# Patient Record
Sex: Female | Born: 1955
Health system: Southern US, Community
[De-identification: ages and names within clinical notes are randomized; demographics above are authoritative.]

## PROBLEM LIST (undated history)

## (undated) ENCOUNTER — Ambulatory Visit: Admission: EM | Payer: Medicare HMO | Source: Home / Self Care

## (undated) DIAGNOSIS — H25813 Combined forms of age-related cataract, bilateral: Secondary | ICD-10-CM

## (undated) DIAGNOSIS — E119 Type 2 diabetes mellitus without complications: Secondary | ICD-10-CM

## (undated) DIAGNOSIS — I10 Essential (primary) hypertension: Secondary | ICD-10-CM

## (undated) DIAGNOSIS — A419 Sepsis, unspecified organism: Secondary | ICD-10-CM

## (undated) DIAGNOSIS — M1990 Unspecified osteoarthritis, unspecified site: Secondary | ICD-10-CM

## (undated) DIAGNOSIS — J189 Pneumonia, unspecified organism: Secondary | ICD-10-CM

## (undated) DIAGNOSIS — F419 Anxiety disorder, unspecified: Secondary | ICD-10-CM

## (undated) DIAGNOSIS — A0472 Enterocolitis due to Clostridium difficile, not specified as recurrent: Secondary | ICD-10-CM

## (undated) DIAGNOSIS — M5126 Other intervertebral disc displacement, lumbar region: Secondary | ICD-10-CM

## (undated) DIAGNOSIS — I639 Cerebral infarction, unspecified: Secondary | ICD-10-CM

## (undated) DIAGNOSIS — Z5189 Encounter for other specified aftercare: Secondary | ICD-10-CM

## (undated) DIAGNOSIS — R0602 Shortness of breath: Secondary | ICD-10-CM

## (undated) DIAGNOSIS — H409 Unspecified glaucoma: Secondary | ICD-10-CM

## (undated) DIAGNOSIS — K219 Gastro-esophageal reflux disease without esophagitis: Secondary | ICD-10-CM

## (undated) DIAGNOSIS — J449 Chronic obstructive pulmonary disease, unspecified: Secondary | ICD-10-CM

## (undated) DIAGNOSIS — J45909 Unspecified asthma, uncomplicated: Secondary | ICD-10-CM

## (undated) DIAGNOSIS — C3490 Malignant neoplasm of unspecified part of unspecified bronchus or lung: Secondary | ICD-10-CM

## (undated) DIAGNOSIS — E785 Hyperlipidemia, unspecified: Secondary | ICD-10-CM

## (undated) DIAGNOSIS — D649 Anemia, unspecified: Secondary | ICD-10-CM

## (undated) DIAGNOSIS — J439 Emphysema, unspecified: Secondary | ICD-10-CM

## (undated) DIAGNOSIS — Z8619 Personal history of other infectious and parasitic diseases: Secondary | ICD-10-CM

## (undated) DIAGNOSIS — T7840XA Allergy, unspecified, initial encounter: Secondary | ICD-10-CM

## (undated) HISTORY — DX: Type 2 diabetes mellitus without complications: E11.9

## (undated) HISTORY — PX: TUBAL LIGATION: SHX77

## (undated) HISTORY — DX: Hyperlipidemia, unspecified: E78.5

## (undated) HISTORY — DX: Pneumonia, unspecified organism: J18.9

## (undated) HISTORY — DX: Chronic obstructive pulmonary disease, unspecified: J44.9

## (undated) HISTORY — DX: Other intervertebral disc displacement, lumbar region: M51.26

## (undated) HISTORY — DX: Emphysema, unspecified: J43.9

## (undated) HISTORY — DX: Enterocolitis due to Clostridium difficile, not specified as recurrent: A04.72

## (undated) HISTORY — DX: Combined forms of age-related cataract, bilateral: H25.813

## (undated) HISTORY — DX: Shortness of breath: R06.02

## (undated) HISTORY — PX: UPPER GI ENDOSCOPY: SHX6162

## (undated) HISTORY — DX: Unspecified asthma, uncomplicated: J45.909

## (undated) HISTORY — DX: Anemia, unspecified: D64.9

## (undated) HISTORY — DX: Personal history of other infectious and parasitic diseases: Z86.19

## (undated) HISTORY — DX: Essential (primary) hypertension: I10

---
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
## 2003-07-27 ENCOUNTER — Other Ambulatory Visit: Payer: Self-pay

## 2003-11-21 ENCOUNTER — Ambulatory Visit: Payer: Self-pay | Admitting: Family Medicine

## 2005-04-18 ENCOUNTER — Ambulatory Visit: Payer: Self-pay

## 2007-07-14 ENCOUNTER — Ambulatory Visit: Payer: Self-pay | Admitting: Orthopedic Surgery

## 2007-08-03 ENCOUNTER — Ambulatory Visit: Payer: Self-pay | Admitting: Pain Medicine

## 2007-08-18 ENCOUNTER — Ambulatory Visit: Payer: Self-pay | Admitting: Pain Medicine

## 2007-09-01 ENCOUNTER — Ambulatory Visit: Payer: Self-pay | Admitting: Physician Assistant

## 2007-11-13 ENCOUNTER — Emergency Department: Payer: Self-pay | Admitting: Emergency Medicine

## 2007-11-14 ENCOUNTER — Emergency Department: Payer: Self-pay | Admitting: Internal Medicine

## 2008-02-07 ENCOUNTER — Emergency Department: Payer: Self-pay | Admitting: Emergency Medicine

## 2008-04-05 ENCOUNTER — Emergency Department: Payer: Self-pay | Admitting: Emergency Medicine

## 2008-05-25 ENCOUNTER — Emergency Department: Payer: Self-pay | Admitting: Emergency Medicine

## 2008-08-07 ENCOUNTER — Emergency Department: Payer: Self-pay | Admitting: Emergency Medicine

## 2009-12-04 ENCOUNTER — Observation Stay: Payer: Self-pay | Admitting: *Deleted

## 2010-07-23 ENCOUNTER — Ambulatory Visit: Payer: Self-pay | Admitting: Family Medicine

## 2011-05-10 DIAGNOSIS — L0291 Cutaneous abscess, unspecified: Secondary | ICD-10-CM | POA: Diagnosis not present

## 2011-05-10 DIAGNOSIS — L0390 Cellulitis, unspecified: Secondary | ICD-10-CM | POA: Diagnosis not present

## 2011-05-10 DIAGNOSIS — I10 Essential (primary) hypertension: Secondary | ICD-10-CM | POA: Diagnosis not present

## 2011-05-21 DIAGNOSIS — E119 Type 2 diabetes mellitus without complications: Secondary | ICD-10-CM | POA: Diagnosis not present

## 2011-05-23 DIAGNOSIS — J449 Chronic obstructive pulmonary disease, unspecified: Secondary | ICD-10-CM | POA: Diagnosis not present

## 2011-05-23 DIAGNOSIS — Z0000 Encounter for general adult medical examination without abnormal findings: Secondary | ICD-10-CM | POA: Diagnosis not present

## 2011-05-23 DIAGNOSIS — E119 Type 2 diabetes mellitus without complications: Secondary | ICD-10-CM | POA: Diagnosis not present

## 2011-05-23 DIAGNOSIS — E781 Pure hyperglyceridemia: Secondary | ICD-10-CM | POA: Diagnosis not present

## 2011-05-23 DIAGNOSIS — I10 Essential (primary) hypertension: Secondary | ICD-10-CM | POA: Diagnosis not present

## 2011-05-23 DIAGNOSIS — F17200 Nicotine dependence, unspecified, uncomplicated: Secondary | ICD-10-CM | POA: Diagnosis not present

## 2011-06-07 DIAGNOSIS — G4700 Insomnia, unspecified: Secondary | ICD-10-CM | POA: Diagnosis not present

## 2011-06-07 DIAGNOSIS — M5126 Other intervertebral disc displacement, lumbar region: Secondary | ICD-10-CM | POA: Diagnosis not present

## 2011-06-07 DIAGNOSIS — M549 Dorsalgia, unspecified: Secondary | ICD-10-CM | POA: Diagnosis not present

## 2011-06-07 DIAGNOSIS — F17200 Nicotine dependence, unspecified, uncomplicated: Secondary | ICD-10-CM | POA: Diagnosis not present

## 2011-06-26 ENCOUNTER — Ambulatory Visit: Payer: Self-pay | Admitting: Family Medicine

## 2011-06-26 DIAGNOSIS — Z1231 Encounter for screening mammogram for malignant neoplasm of breast: Secondary | ICD-10-CM | POA: Diagnosis not present

## 2011-08-10 ENCOUNTER — Emergency Department: Payer: Self-pay | Admitting: Emergency Medicine

## 2011-08-13 DIAGNOSIS — I10 Essential (primary) hypertension: Secondary | ICD-10-CM | POA: Diagnosis not present

## 2011-08-13 DIAGNOSIS — E119 Type 2 diabetes mellitus without complications: Secondary | ICD-10-CM | POA: Diagnosis not present

## 2011-08-13 DIAGNOSIS — M5126 Other intervertebral disc displacement, lumbar region: Secondary | ICD-10-CM | POA: Diagnosis not present

## 2011-08-13 DIAGNOSIS — J209 Acute bronchitis, unspecified: Secondary | ICD-10-CM | POA: Diagnosis not present

## 2011-08-19 ENCOUNTER — Ambulatory Visit: Payer: Self-pay | Admitting: Family Medicine

## 2011-08-19 DIAGNOSIS — M5126 Other intervertebral disc displacement, lumbar region: Secondary | ICD-10-CM | POA: Diagnosis not present

## 2011-08-19 DIAGNOSIS — M47817 Spondylosis without myelopathy or radiculopathy, lumbosacral region: Secondary | ICD-10-CM | POA: Diagnosis not present

## 2011-09-06 DIAGNOSIS — IMO0002 Reserved for concepts with insufficient information to code with codable children: Secondary | ICD-10-CM | POA: Diagnosis not present

## 2011-12-02 DIAGNOSIS — E119 Type 2 diabetes mellitus without complications: Secondary | ICD-10-CM | POA: Diagnosis not present

## 2011-12-02 DIAGNOSIS — J209 Acute bronchitis, unspecified: Secondary | ICD-10-CM | POA: Diagnosis not present

## 2011-12-02 DIAGNOSIS — E781 Pure hyperglyceridemia: Secondary | ICD-10-CM | POA: Diagnosis not present

## 2011-12-02 DIAGNOSIS — I10 Essential (primary) hypertension: Secondary | ICD-10-CM | POA: Diagnosis not present

## 2011-12-12 DIAGNOSIS — E119 Type 2 diabetes mellitus without complications: Secondary | ICD-10-CM | POA: Diagnosis not present

## 2012-01-13 DIAGNOSIS — Z23 Encounter for immunization: Secondary | ICD-10-CM | POA: Diagnosis not present

## 2012-01-13 DIAGNOSIS — E781 Pure hyperglyceridemia: Secondary | ICD-10-CM | POA: Diagnosis not present

## 2012-01-13 DIAGNOSIS — I10 Essential (primary) hypertension: Secondary | ICD-10-CM | POA: Diagnosis not present

## 2012-01-13 DIAGNOSIS — M549 Dorsalgia, unspecified: Secondary | ICD-10-CM | POA: Diagnosis not present

## 2012-01-13 DIAGNOSIS — M25559 Pain in unspecified hip: Secondary | ICD-10-CM | POA: Diagnosis not present

## 2012-02-02 ENCOUNTER — Emergency Department: Payer: Self-pay | Admitting: Emergency Medicine

## 2012-02-02 DIAGNOSIS — Z79899 Other long term (current) drug therapy: Secondary | ICD-10-CM | POA: Diagnosis not present

## 2012-02-02 DIAGNOSIS — R51 Headache: Secondary | ICD-10-CM | POA: Diagnosis not present

## 2012-02-02 DIAGNOSIS — I10 Essential (primary) hypertension: Secondary | ICD-10-CM | POA: Diagnosis not present

## 2012-02-02 DIAGNOSIS — J449 Chronic obstructive pulmonary disease, unspecified: Secondary | ICD-10-CM | POA: Diagnosis not present

## 2012-02-02 DIAGNOSIS — R059 Cough, unspecified: Secondary | ICD-10-CM | POA: Diagnosis not present

## 2012-02-02 DIAGNOSIS — E119 Type 2 diabetes mellitus without complications: Secondary | ICD-10-CM | POA: Diagnosis not present

## 2012-02-02 DIAGNOSIS — J4489 Other specified chronic obstructive pulmonary disease: Secondary | ICD-10-CM | POA: Diagnosis not present

## 2012-02-02 DIAGNOSIS — F17200 Nicotine dependence, unspecified, uncomplicated: Secondary | ICD-10-CM | POA: Diagnosis not present

## 2012-02-02 DIAGNOSIS — R05 Cough: Secondary | ICD-10-CM | POA: Diagnosis not present

## 2012-02-02 DIAGNOSIS — R0602 Shortness of breath: Secondary | ICD-10-CM | POA: Diagnosis not present

## 2012-02-02 LAB — CBC
HGB: 13.9 g/dL (ref 12.0–16.0)
MCH: 31.7 pg (ref 26.0–34.0)
MCHC: 32.9 g/dL (ref 32.0–36.0)
Platelet: 301 10*3/uL (ref 150–440)
RBC: 4.37 10*6/uL (ref 3.80–5.20)
RDW: 14.2 % (ref 11.5–14.5)

## 2012-02-02 LAB — BASIC METABOLIC PANEL
BUN: 14 mg/dL (ref 7–18)
Calcium, Total: 8.6 mg/dL (ref 8.5–10.1)
Co2: 24 mmol/L (ref 21–32)
Creatinine: 0.68 mg/dL (ref 0.60–1.30)
EGFR (African American): >60
Osmolality: 281 (ref 275–301)
Potassium: 3.9 mmol/L (ref 3.5–5.1)
Sodium: 137 mmol/L (ref 136–145)

## 2012-02-02 LAB — CK TOTAL AND CKMB (NOT AT ARMC)
CK, Total: 91 U/L (ref 21–215)
CK-MB: <0.5 ng/mL — ABNORMAL LOW (ref 0.5–3.6)

## 2012-02-04 DIAGNOSIS — H6690 Otitis media, unspecified, unspecified ear: Secondary | ICD-10-CM | POA: Diagnosis not present

## 2012-03-16 DIAGNOSIS — E119 Type 2 diabetes mellitus without complications: Secondary | ICD-10-CM | POA: Diagnosis not present

## 2012-03-16 DIAGNOSIS — I10 Essential (primary) hypertension: Secondary | ICD-10-CM | POA: Diagnosis not present

## 2012-03-16 DIAGNOSIS — E782 Mixed hyperlipidemia: Secondary | ICD-10-CM | POA: Diagnosis not present

## 2012-03-16 DIAGNOSIS — H9319 Tinnitus, unspecified ear: Secondary | ICD-10-CM | POA: Diagnosis not present

## 2012-04-02 DIAGNOSIS — H6980 Other specified disorders of Eustachian tube, unspecified ear: Secondary | ICD-10-CM | POA: Diagnosis not present

## 2012-04-02 DIAGNOSIS — H903 Sensorineural hearing loss, bilateral: Secondary | ICD-10-CM | POA: Diagnosis not present

## 2012-04-02 DIAGNOSIS — H905 Unspecified sensorineural hearing loss: Secondary | ICD-10-CM | POA: Diagnosis not present

## 2012-05-29 DIAGNOSIS — J449 Chronic obstructive pulmonary disease, unspecified: Secondary | ICD-10-CM | POA: Diagnosis not present

## 2012-05-29 DIAGNOSIS — I10 Essential (primary) hypertension: Secondary | ICD-10-CM | POA: Diagnosis not present

## 2012-05-29 DIAGNOSIS — E119 Type 2 diabetes mellitus without complications: Secondary | ICD-10-CM | POA: Diagnosis not present

## 2012-05-29 DIAGNOSIS — G4700 Insomnia, unspecified: Secondary | ICD-10-CM | POA: Diagnosis not present

## 2012-06-29 DIAGNOSIS — M2240 Chondromalacia patellae, unspecified knee: Secondary | ICD-10-CM | POA: Diagnosis not present

## 2012-09-01 DIAGNOSIS — E1129 Type 2 diabetes mellitus with other diabetic kidney complication: Secondary | ICD-10-CM | POA: Diagnosis not present

## 2012-09-01 DIAGNOSIS — E781 Pure hyperglyceridemia: Secondary | ICD-10-CM | POA: Diagnosis not present

## 2012-09-01 DIAGNOSIS — I10 Essential (primary) hypertension: Secondary | ICD-10-CM | POA: Diagnosis not present

## 2012-09-03 DIAGNOSIS — I10 Essential (primary) hypertension: Secondary | ICD-10-CM | POA: Diagnosis not present

## 2012-09-03 DIAGNOSIS — E781 Pure hyperglyceridemia: Secondary | ICD-10-CM | POA: Diagnosis not present

## 2012-09-03 DIAGNOSIS — E119 Type 2 diabetes mellitus without complications: Secondary | ICD-10-CM | POA: Diagnosis not present

## 2012-09-03 DIAGNOSIS — M25569 Pain in unspecified knee: Secondary | ICD-10-CM | POA: Diagnosis not present

## 2012-09-17 DIAGNOSIS — M549 Dorsalgia, unspecified: Secondary | ICD-10-CM | POA: Diagnosis not present

## 2012-09-17 DIAGNOSIS — M159 Polyosteoarthritis, unspecified: Secondary | ICD-10-CM | POA: Diagnosis not present

## 2012-09-17 DIAGNOSIS — M545 Low back pain, unspecified: Secondary | ICD-10-CM | POA: Diagnosis not present

## 2012-11-17 DIAGNOSIS — J449 Chronic obstructive pulmonary disease, unspecified: Secondary | ICD-10-CM | POA: Diagnosis not present

## 2012-11-17 DIAGNOSIS — Z23 Encounter for immunization: Secondary | ICD-10-CM | POA: Diagnosis not present

## 2012-11-17 DIAGNOSIS — M549 Dorsalgia, unspecified: Secondary | ICD-10-CM | POA: Diagnosis not present

## 2012-11-17 DIAGNOSIS — M25569 Pain in unspecified knee: Secondary | ICD-10-CM | POA: Diagnosis not present

## 2012-11-17 DIAGNOSIS — M545 Low back pain, unspecified: Secondary | ICD-10-CM | POA: Diagnosis not present

## 2012-11-17 DIAGNOSIS — M6740 Ganglion, unspecified site: Secondary | ICD-10-CM | POA: Diagnosis not present

## 2012-12-09 DIAGNOSIS — I10 Essential (primary) hypertension: Secondary | ICD-10-CM | POA: Diagnosis not present

## 2012-12-09 DIAGNOSIS — E119 Type 2 diabetes mellitus without complications: Secondary | ICD-10-CM | POA: Diagnosis not present

## 2012-12-09 DIAGNOSIS — Z23 Encounter for immunization: Secondary | ICD-10-CM | POA: Diagnosis not present

## 2012-12-09 DIAGNOSIS — J449 Chronic obstructive pulmonary disease, unspecified: Secondary | ICD-10-CM | POA: Diagnosis not present

## 2012-12-09 DIAGNOSIS — E782 Mixed hyperlipidemia: Secondary | ICD-10-CM | POA: Diagnosis not present

## 2013-01-01 ENCOUNTER — Ambulatory Visit: Payer: Self-pay | Admitting: Family Medicine

## 2013-01-01 DIAGNOSIS — Z1231 Encounter for screening mammogram for malignant neoplasm of breast: Secondary | ICD-10-CM | POA: Diagnosis not present

## 2013-01-01 DIAGNOSIS — I10 Essential (primary) hypertension: Secondary | ICD-10-CM | POA: Diagnosis not present

## 2013-01-01 DIAGNOSIS — E781 Pure hyperglyceridemia: Secondary | ICD-10-CM | POA: Diagnosis not present

## 2013-01-01 LAB — HM MAMMOGRAPHY

## 2013-03-22 DIAGNOSIS — Z23 Encounter for immunization: Secondary | ICD-10-CM | POA: Diagnosis not present

## 2013-03-22 DIAGNOSIS — E782 Mixed hyperlipidemia: Secondary | ICD-10-CM | POA: Diagnosis not present

## 2013-03-22 DIAGNOSIS — I10 Essential (primary) hypertension: Secondary | ICD-10-CM | POA: Diagnosis not present

## 2013-03-22 DIAGNOSIS — J449 Chronic obstructive pulmonary disease, unspecified: Secondary | ICD-10-CM | POA: Diagnosis not present

## 2013-03-22 DIAGNOSIS — J069 Acute upper respiratory infection, unspecified: Secondary | ICD-10-CM | POA: Diagnosis not present

## 2013-03-31 DIAGNOSIS — J449 Chronic obstructive pulmonary disease, unspecified: Secondary | ICD-10-CM | POA: Diagnosis not present

## 2013-03-31 DIAGNOSIS — E782 Mixed hyperlipidemia: Secondary | ICD-10-CM | POA: Diagnosis not present

## 2013-03-31 DIAGNOSIS — E1129 Type 2 diabetes mellitus with other diabetic kidney complication: Secondary | ICD-10-CM | POA: Diagnosis not present

## 2013-03-31 DIAGNOSIS — I10 Essential (primary) hypertension: Secondary | ICD-10-CM | POA: Diagnosis not present

## 2013-03-31 DIAGNOSIS — Z23 Encounter for immunization: Secondary | ICD-10-CM | POA: Diagnosis not present

## 2013-06-25 DIAGNOSIS — E781 Pure hyperglyceridemia: Secondary | ICD-10-CM | POA: Diagnosis not present

## 2013-09-01 DIAGNOSIS — N951 Menopausal and female climacteric states: Secondary | ICD-10-CM | POA: Diagnosis not present

## 2013-09-01 DIAGNOSIS — E119 Type 2 diabetes mellitus without complications: Secondary | ICD-10-CM | POA: Diagnosis not present

## 2013-09-01 DIAGNOSIS — J441 Chronic obstructive pulmonary disease with (acute) exacerbation: Secondary | ICD-10-CM | POA: Diagnosis not present

## 2013-09-01 DIAGNOSIS — E782 Mixed hyperlipidemia: Secondary | ICD-10-CM | POA: Diagnosis not present

## 2013-09-01 DIAGNOSIS — Z23 Encounter for immunization: Secondary | ICD-10-CM | POA: Diagnosis not present

## 2013-10-05 DIAGNOSIS — J449 Chronic obstructive pulmonary disease, unspecified: Secondary | ICD-10-CM | POA: Diagnosis not present

## 2013-10-05 DIAGNOSIS — Z23 Encounter for immunization: Secondary | ICD-10-CM | POA: Diagnosis not present

## 2013-10-05 DIAGNOSIS — J209 Acute bronchitis, unspecified: Secondary | ICD-10-CM | POA: Diagnosis not present

## 2013-10-05 DIAGNOSIS — R609 Edema, unspecified: Secondary | ICD-10-CM | POA: Diagnosis not present

## 2013-10-05 DIAGNOSIS — J441 Chronic obstructive pulmonary disease with (acute) exacerbation: Secondary | ICD-10-CM | POA: Diagnosis not present

## 2013-10-26 DIAGNOSIS — R609 Edema, unspecified: Secondary | ICD-10-CM | POA: Diagnosis not present

## 2013-10-26 DIAGNOSIS — J209 Acute bronchitis, unspecified: Secondary | ICD-10-CM | POA: Diagnosis not present

## 2013-10-26 DIAGNOSIS — W57XXXA Bitten or stung by nonvenomous insect and other nonvenomous arthropods, initial encounter: Secondary | ICD-10-CM | POA: Diagnosis not present

## 2013-10-26 DIAGNOSIS — J441 Chronic obstructive pulmonary disease with (acute) exacerbation: Secondary | ICD-10-CM | POA: Diagnosis not present

## 2013-10-26 DIAGNOSIS — Z23 Encounter for immunization: Secondary | ICD-10-CM | POA: Diagnosis not present

## 2013-10-26 DIAGNOSIS — T148 Other injury of unspecified body region: Secondary | ICD-10-CM | POA: Diagnosis not present

## 2014-02-24 DIAGNOSIS — Z23 Encounter for immunization: Secondary | ICD-10-CM | POA: Diagnosis not present

## 2014-02-24 DIAGNOSIS — J40 Bronchitis, not specified as acute or chronic: Secondary | ICD-10-CM | POA: Diagnosis not present

## 2014-02-24 DIAGNOSIS — E119 Type 2 diabetes mellitus without complications: Secondary | ICD-10-CM | POA: Diagnosis not present

## 2014-02-24 DIAGNOSIS — E781 Pure hyperglyceridemia: Secondary | ICD-10-CM | POA: Diagnosis not present

## 2014-02-24 DIAGNOSIS — M549 Dorsalgia, unspecified: Secondary | ICD-10-CM | POA: Diagnosis not present

## 2014-02-24 DIAGNOSIS — J209 Acute bronchitis, unspecified: Secondary | ICD-10-CM | POA: Diagnosis not present

## 2014-02-24 DIAGNOSIS — R079 Chest pain, unspecified: Secondary | ICD-10-CM | POA: Diagnosis not present

## 2014-02-24 LAB — HEMOGLOBIN A1C: Hgb A1c MFr Bld: 7.2 % — AB (ref 4.0–6.0)

## 2014-03-28 ENCOUNTER — Encounter: Payer: Self-pay | Admitting: Cardiovascular Disease

## 2014-03-28 ENCOUNTER — Ambulatory Visit (INDEPENDENT_AMBULATORY_CARE_PROVIDER_SITE_OTHER): Payer: Medicare Other | Admitting: Cardiovascular Disease

## 2014-03-28 ENCOUNTER — Encounter: Payer: Self-pay | Admitting: *Deleted

## 2014-03-28 ENCOUNTER — Encounter (INDEPENDENT_AMBULATORY_CARE_PROVIDER_SITE_OTHER): Payer: Self-pay

## 2014-03-28 VITALS — BP 120/60 | HR 100 | Ht 59.0 in | Wt 191.5 lb

## 2014-03-28 DIAGNOSIS — I10 Essential (primary) hypertension: Secondary | ICD-10-CM | POA: Diagnosis not present

## 2014-03-28 DIAGNOSIS — Z720 Tobacco use: Secondary | ICD-10-CM | POA: Diagnosis not present

## 2014-03-28 DIAGNOSIS — F17210 Nicotine dependence, cigarettes, uncomplicated: Secondary | ICD-10-CM | POA: Insufficient documentation

## 2014-03-28 DIAGNOSIS — R079 Chest pain, unspecified: Secondary | ICD-10-CM | POA: Diagnosis not present

## 2014-03-28 NOTE — Assessment & Plan Note (Signed)
I had a prolonged discussion with her about the importance of smoking cessation. She is trying to quit with a patch.

## 2014-03-28 NOTE — Assessment & Plan Note (Signed)
She reports that the chest pain was in the setting of recent upper respiratory tract infection. She has significant exertional dyspnea. She has multiple risk factors for coronary artery disease. I recommend evaluation with a pharmacologic nuclear stress test. She is not able to exercise on a treadmill due to chronic back pain. She uses a cane. I discussed with the patient the importance of lifestyle changes in order to decrease the chance of future coronary artery disease and cardiovascular events. We discussed the importance of controlling risk factors, healthy diet as well as regular exercise. I also explained to him that a normal stress test does not rule out atherosclerosis.

## 2014-03-28 NOTE — Assessment & Plan Note (Signed)
Blood pressure is well controlled on current medications. 

## 2014-03-28 NOTE — Patient Instructions (Addendum)
[place]  Your caregiver has ordered a Stress Test with nuclear imaging. The purpose of this test is to evaluate the blood supply to your heart muscle. This procedure is referred to as a "Non-Invasive Stress Test." This is because other than having an IV started in your vein, nothing is inserted or "invades" your body. Cardiac stress tests are done to find areas of poor blood flow to the heart by determining the extent of coronary artery disease (CAD). Some patients exercise on a treadmill, which naturally increases the blood flow to your heart, while others who are  unable to walk on a treadmill due to physical limitations have a pharmacologic/chemical stress agent called Lexiscan . This medicine will mimic walking on a treadmill by temporarily increasing your coronary blood flow.   Please note: these test may take anywhere between 2-4 hours to complete  PLEASE REPORT TO [facility] AT THE FIRST DESK WILL DIRECT YOU WHERE TO GO  Date of Procedure:_______3/8/16______________________________  Arrival Time for Procedure:_____0745 am_________________________  Instructions regarding medication:   _x___ : Hold diabetes medication morning of procedure   PLEASE NOTIFY THE OFFICE AT LEAST 24 HOURS IN ADVANCE IF YOU ARE UNABLE TO KEEP YOUR APPOINTMENT.  [phone] AND  PLEASE NOTIFY NUCLEAR MEDICINE AT [facility] AT LEAST 24 HOURS IN ADVANCE IF YOU ARE UNABLE TO KEEP YOUR APPOINTMENT. [phone]  How to prepare for your Myoview test:  1. Do not eat or drink after midnight 2. No caffeine for 24 hours prior to test 3. No smoking 24 hours prior to test. 4. Your medication may be taken with water.  If your doctor stopped a medication because of this test, do not take that medication. 5. Ladies, please do not wear dresses.  Skirts or pants are appropriate. Please wear a short sleeve shirt. 6. No perfume, cologne or lotion. 7. Wear comfortable walking shoes. No  heels!  Your physician recommends that you schedule a follow-up appointment in:  As needed

## 2014-03-28 NOTE — Progress Notes (Signed)
Primary care physician: Dr. Caryn Section  HPI  This is a pleasant 59 year old female who was referred for evaluation of exertional chest pain and shortness of breath. She has no previous cardiac history. She has multiple chronic medical conditions that include type 2 diabetes, hypertension, hyperlipidemia with intolerance to medications, tobacco use, COPD and family history of coronary artery disease. She reports recent upper respiratory tract infection which was associated with exertional chest pain and worsening exertional dyspnea. She thinks that the symptoms were related to COPD. She continues to smoke one pack per day. She denies any orthopnea or PND. No palpitations, syncope or presyncope. She is not aware of any previous cardiac workup.  Allergies  Allergen Reactions  . Albuterol   . Codeine   . Crestor [Rosuvastatin]   . Gemfibrozil   . Lipitor [Atorvastatin]   . Penicillins   . Salmeterol Xinafoate   . Yellow Jacket Venom [Bee Venom]      No current outpatient prescriptions on file prior to visit.   No current facility-administered medications on file prior to visit.     Past Medical History  Diagnosis Date  . Menopausal symptom   . Edema   . Hip pain   . Back pain   . Asthma   . Anemia   . Insomnia   . Bronchitis   . Diabetes mellitus without complication   . Hypertension   . COPD (chronic obstructive pulmonary disease)   . Displacement of lumbar intervertebral disc without myelopathy   . Hyperglyceridemia      Past Surgical History  Procedure Laterality Date  . Cesarean section       Family History  Problem Relation Age of Onset  . Heart failure Mother   . Heart disease Mother   . Stroke Mother   . Heart disease Brother      History   Social History  . Marital Status: Widowed    Spouse Name: N/A  . Number of Children: N/A  . Years of Education: N/A   Occupational History  . Not on file.   Social History Main Topics  . Smoking status:  Current Every Day Smoker  . Smokeless tobacco: Not on file  . Alcohol Use: Yes  . Drug Use: No  . Sexual Activity: Not on file   Other Topics Concern  . Not on file   Social History Narrative     ROS A 10 point review of system was performed. It is negative other than that mentioned in the history of present illness.   PHYSICAL EXAM   BP 120/60 mmHg  Pulse 100  Ht 4\' 11"  (1.499 m)  Wt 191 lb 8 oz (86.864 kg)  BMI 38.66 kg/m2 Constitutional: She is oriented to person, place, and time. She appears well-developed and well-nourished. No distress.  HENT: No nasal discharge.  Head: Normocephalic and atraumatic.  Eyes: Pupils are equal and round. No discharge.  Neck: Normal range of motion. Neck supple. No JVD present. No thyromegaly present.  Cardiovascular: Normal rate, regular rhythm, normal heart sounds. Exam reveals no gallop and no friction rub. No murmur heard.  Pulmonary/Chest: Effort normal and breath sounds normal. No stridor. No respiratory distress. She has no wheezes. She has no rales. She exhibits no tenderness.  Abdominal: Soft. Bowel sounds are normal. She exhibits no distension. There is no tenderness. There is no rebound and no guarding.  Musculoskeletal: Normal range of motion. She exhibits no edema and no tenderness.  Neurological: She is alert and oriented  to person, place, and time. Coordination normal.  Skin: Skin is warm and dry. No rash noted. She is not diaphoretic. No erythema. No pallor.  Psychiatric: She has a normal mood and affect. Her behavior is normal. Judgment and thought content normal.     HKU:VJDYN  Rhythm  Low voltage in precordial leads.   -Right atrial enlargement.   ABNORMAL     ASSESSMENT AND PLAN

## 2014-04-05 ENCOUNTER — Telehealth: Payer: Self-pay | Admitting: *Deleted

## 2014-04-05 NOTE — Telephone Encounter (Signed)
[facility] is calling to let us know that we scheduled this patient to come for stress test and they never came.

## 2014-04-05 NOTE — Telephone Encounter (Signed)
Called patient  She stated she called and cancelled her Myoview Sunday through or answering service  She stated she can not do it at this time for personal reasons  She will call and schedule when she is ready

## 2014-04-14 DIAGNOSIS — E11319 Type 2 diabetes mellitus with unspecified diabetic retinopathy without macular edema: Secondary | ICD-10-CM | POA: Diagnosis not present

## 2014-04-27 ENCOUNTER — Telehealth: Payer: Self-pay

## 2014-04-27 NOTE — Telephone Encounter (Signed)
LVM 3/30 

## 2014-04-27 NOTE — Telephone Encounter (Signed)
Pt would like to r/s her Stress Test

## 2014-04-29 NOTE — Telephone Encounter (Signed)
Left message on machine for pt to contact the office.   

## 2014-04-29 NOTE — Telephone Encounter (Signed)
Left message on pt's vm to call and let us know what day she would like to resched her Lexiscan.

## 2014-06-12 ENCOUNTER — Encounter: Payer: Self-pay | Admitting: Emergency Medicine

## 2014-06-12 ENCOUNTER — Other Ambulatory Visit: Payer: Self-pay

## 2014-06-12 ENCOUNTER — Emergency Department: Payer: Medicare Other

## 2014-06-12 ENCOUNTER — Inpatient Hospital Stay (HOSPITAL_BASED_OUTPATIENT_CLINIC_OR_DEPARTMENT_OTHER)
Admit: 2014-06-12 | Discharge: 2014-06-12 | Disposition: A | Discharge status: Home / Self Care | Payer: Medicare Other | Attending: Internal Medicine | Admitting: Internal Medicine

## 2014-06-12 ENCOUNTER — Observation Stay
Admission: EM | Admit: 2014-06-12 | Discharge: 2014-06-12 | Disposition: A | Discharge status: Home / Self Care | Payer: Medicare Other | Attending: Internal Medicine | Admitting: Internal Medicine

## 2014-06-12 DIAGNOSIS — J449 Chronic obstructive pulmonary disease, unspecified: Secondary | ICD-10-CM | POA: Diagnosis not present

## 2014-06-12 DIAGNOSIS — Z888 Allergy status to other drugs, medicaments and biological substances status: Secondary | ICD-10-CM | POA: Diagnosis not present

## 2014-06-12 DIAGNOSIS — Z91030 Bee allergy status: Secondary | ICD-10-CM | POA: Diagnosis not present

## 2014-06-12 DIAGNOSIS — K219 Gastro-esophageal reflux disease without esophagitis: Secondary | ICD-10-CM | POA: Diagnosis not present

## 2014-06-12 DIAGNOSIS — Z79899 Other long term (current) drug therapy: Secondary | ICD-10-CM | POA: Insufficient documentation

## 2014-06-12 DIAGNOSIS — R0602 Shortness of breath: Secondary | ICD-10-CM | POA: Diagnosis not present

## 2014-06-12 DIAGNOSIS — E119 Type 2 diabetes mellitus without complications: Secondary | ICD-10-CM | POA: Diagnosis not present

## 2014-06-12 DIAGNOSIS — M549 Dorsalgia, unspecified: Secondary | ICD-10-CM | POA: Diagnosis not present

## 2014-06-12 DIAGNOSIS — Z9981 Dependence on supplemental oxygen: Secondary | ICD-10-CM | POA: Insufficient documentation

## 2014-06-12 DIAGNOSIS — E781 Pure hyperglyceridemia: Secondary | ICD-10-CM | POA: Insufficient documentation

## 2014-06-12 DIAGNOSIS — Z880 Allergy status to penicillin: Secondary | ICD-10-CM | POA: Insufficient documentation

## 2014-06-12 DIAGNOSIS — I10 Essential (primary) hypertension: Secondary | ICD-10-CM | POA: Diagnosis not present

## 2014-06-12 DIAGNOSIS — G4700 Insomnia, unspecified: Secondary | ICD-10-CM | POA: Diagnosis not present

## 2014-06-12 DIAGNOSIS — Z823 Family history of stroke: Secondary | ICD-10-CM | POA: Insufficient documentation

## 2014-06-12 DIAGNOSIS — J45909 Unspecified asthma, uncomplicated: Secondary | ICD-10-CM | POA: Diagnosis not present

## 2014-06-12 DIAGNOSIS — M5126 Other intervertebral disc displacement, lumbar region: Secondary | ICD-10-CM | POA: Diagnosis not present

## 2014-06-12 DIAGNOSIS — Z791 Long term (current) use of non-steroidal anti-inflammatories (NSAID): Secondary | ICD-10-CM | POA: Diagnosis not present

## 2014-06-12 DIAGNOSIS — F17200 Nicotine dependence, unspecified, uncomplicated: Secondary | ICD-10-CM | POA: Insufficient documentation

## 2014-06-12 DIAGNOSIS — Z7982 Long term (current) use of aspirin: Secondary | ICD-10-CM | POA: Insufficient documentation

## 2014-06-12 DIAGNOSIS — Z8249 Family history of ischemic heart disease and other diseases of the circulatory system: Secondary | ICD-10-CM | POA: Insufficient documentation

## 2014-06-12 DIAGNOSIS — M1990 Unspecified osteoarthritis, unspecified site: Secondary | ICD-10-CM | POA: Insufficient documentation

## 2014-06-12 DIAGNOSIS — G8929 Other chronic pain: Secondary | ICD-10-CM | POA: Insufficient documentation

## 2014-06-12 DIAGNOSIS — I313 Pericardial effusion (noninflammatory): Secondary | ICD-10-CM | POA: Diagnosis not present

## 2014-06-12 DIAGNOSIS — I509 Heart failure, unspecified: Secondary | ICD-10-CM | POA: Diagnosis not present

## 2014-06-12 DIAGNOSIS — E785 Hyperlipidemia, unspecified: Secondary | ICD-10-CM | POA: Insufficient documentation

## 2014-06-12 DIAGNOSIS — Z885 Allergy status to narcotic agent status: Secondary | ICD-10-CM | POA: Insufficient documentation

## 2014-06-12 DIAGNOSIS — R079 Chest pain, unspecified: Secondary | ICD-10-CM | POA: Diagnosis not present

## 2014-06-12 DIAGNOSIS — Z720 Tobacco use: Secondary | ICD-10-CM | POA: Diagnosis not present

## 2014-06-12 HISTORY — DX: Unspecified osteoarthritis, unspecified site: M19.90

## 2014-06-12 LAB — COMPREHENSIVE METABOLIC PANEL
ALT: UNDETERMINED U/L (ref 14–54)
AST: 59 U/L — ABNORMAL HIGH (ref 15–41)
Albumin: UNDETERMINED g/dL (ref 3.5–5.0)
Alkaline Phosphatase: 86 U/L (ref 38–126)
Anion gap: UNDETERMINED (ref 5–15)
BUN: 18 mg/dL (ref 6–20)
CO2: UNDETERMINED mmol/L (ref 22–32)
Calcium: UNDETERMINED mg/dL (ref 8.9–10.3)
Chloride: 100 mmol/L — ABNORMAL LOW (ref 101–111)
Creatinine, Ser: 0.65 mg/dL (ref 0.44–1.00)
GFR calc Af Amer: >60 mL/min (ref >60)
GFR calc non Af Amer: >60 mL/min (ref >60)
Glucose, Bld: UNDETERMINED mg/dL (ref 65–99)
POTASSIUM: UNDETERMINED mmol/L (ref 3.5–5.1)
Sodium: UNDETERMINED mmol/L (ref 135–145)
Total Bilirubin: 0.3 mg/dL (ref 0.3–1.2)
Total Protein: UNDETERMINED g/dL (ref 6.5–8.1)

## 2014-06-12 LAB — CBC
HCT: 37.8 % (ref 35.0–47.0)
HEMOGLOBIN: 13.9 g/dL (ref 12.0–16.0)
MCH: 40.7 pg — ABNORMAL HIGH (ref 26.0–34.0)
MCHC: 36.7 g/dL — AB (ref 32.0–36.0)
MCV: 110.8 fL — ABNORMAL HIGH (ref 80.0–100.0)
Platelets: 426 10*3/uL (ref 150–440)
RBC: 3.41 MIL/uL — ABNORMAL LOW (ref 3.80–5.20)
RDW: 16.3 % — ABNORMAL HIGH (ref 11.5–14.5)
WBC: 12.6 10*3/uL — AB (ref 3.6–11.0)

## 2014-06-12 LAB — TROPONIN I
Troponin I: <0.03 ng/mL (ref <0.031)
Troponin I: <0.03 ng/mL (ref <0.031)

## 2014-06-12 LAB — GLUCOSE, CAPILLARY
GLUCOSE-CAPILLARY: 148 mg/dL — AB (ref 65–99)
Glucose-Capillary: 150 mg/dL — ABNORMAL HIGH (ref 65–99)

## 2014-06-12 MED ORDER — LEVALBUTEROL TARTRATE 45 MCG/ACT IN AERO: 1.0000 | INHALATION_SPRAY | RESPIRATORY_TRACT | Status: DC | PRN

## 2014-06-12 MED ORDER — SODIUM CHLORIDE 0.9 % IJ SOLN: 3.0000 mL | INTRAMUSCULAR | Status: DC | PRN

## 2014-06-12 MED ORDER — ZOLPIDEM TARTRATE 5 MG PO TABS: 5.0000 mg | ORAL_TABLET | Freq: Every day | ORAL | Status: DC

## 2014-06-12 MED ORDER — HYDROCHLOROTHIAZIDE 25 MG PO TABS
25.0000 mg | ORAL_TABLET | Freq: Every day | ORAL | Status: DC
  Administered 2014-06-12: 25 mg via ORAL
  Filled 2014-06-12: qty 1

## 2014-06-12 MED ORDER — ASPIRIN 81 MG PO TABS
81.0000 mg | ORAL_TABLET | Freq: Every day | ORAL | Status: DC
  Filled 2014-06-12: qty 1

## 2014-06-12 MED ORDER — LEVALBUTEROL HCL 1.25 MG/3ML IN NEBU
1.2500 mg | INHALATION_SOLUTION | RESPIRATORY_TRACT | Status: DC | PRN
  Filled 2014-06-12: qty 3

## 2014-06-12 MED ORDER — LEVALBUTEROL HCL 1.25 MG/0.5ML IN NEBU
1.2500 mg | INHALATION_SOLUTION | RESPIRATORY_TRACT | Status: DC | PRN
Start: 2014-06-12 — End: 2014-06-12
  Filled 2014-06-12: qty 0.5

## 2014-06-12 MED ORDER — HYDROCODONE-ACETAMINOPHEN 10-325 MG PO TABS
1.0000 | ORAL_TABLET | Freq: Four times a day (QID) | ORAL | Status: DC | PRN
  Administered 2014-06-12: 1 via ORAL
  Filled 2014-06-12: qty 1

## 2014-06-12 MED ORDER — METFORMIN HCL 500 MG PO TABS
1000.0000 mg | ORAL_TABLET | Freq: Two times a day (BID) | ORAL | Status: DC
  Administered 2014-06-12: 1000 mg via ORAL
  Filled 2014-06-12: qty 2

## 2014-06-12 MED ORDER — INSULIN ASPART 100 UNIT/ML ~~LOC~~ SOLN
0.0000 [IU] | Freq: Three times a day (TID) | SUBCUTANEOUS | Status: DC
Start: 2014-06-12 — End: 2014-06-12

## 2014-06-12 MED ORDER — ONDANSETRON HCL 4 MG/2ML IJ SOLN: 4.0000 mg | Freq: Four times a day (QID) | INTRAMUSCULAR | Status: DC | PRN

## 2014-06-12 MED ORDER — ENOXAPARIN SODIUM 40 MG/0.4ML ~~LOC~~ SOLN
40.0000 mg | SUBCUTANEOUS | Status: DC
  Administered 2014-06-12: 40 mg via SUBCUTANEOUS
  Filled 2014-06-12: qty 0.4

## 2014-06-12 MED ORDER — ASPIRIN 81 MG PO TBEC: 81.0000 mg | DELAYED_RELEASE_TABLET | Freq: Every day | ORAL | Status: DC

## 2014-06-12 MED ORDER — ASPIRIN 81 MG PO CHEW
CHEWABLE_TABLET | ORAL | Status: AC
Start: 2014-06-12 — End: 2014-06-12
  Administered 2014-06-12: 324 mg via ORAL
  Filled 2014-06-12: qty 4

## 2014-06-12 MED ORDER — ONDANSETRON HCL 4 MG PO TABS: 4.0000 mg | ORAL_TABLET | Freq: Four times a day (QID) | ORAL | Status: DC | PRN

## 2014-06-12 MED ORDER — ASPIRIN 81 MG PO CHEW
324.0000 mg | CHEWABLE_TABLET | Freq: Once | ORAL | Status: AC
  Administered 2014-06-12: 324 mg via ORAL

## 2014-06-12 MED ORDER — SODIUM CHLORIDE 0.9 % IJ SOLN
3.0000 mL | Freq: Two times a day (BID) | INTRAMUSCULAR | Status: DC
  Administered 2014-06-12: 3 mL via INTRAVENOUS

## 2014-06-12 MED ORDER — ASPIRIN EC 81 MG PO TBEC
81.0000 mg | DELAYED_RELEASE_TABLET | Freq: Every day | ORAL | Status: DC
  Administered 2014-06-12: 81 mg via ORAL

## 2014-06-12 MED ORDER — SODIUM CHLORIDE 0.9 % IV SOLN: 250.0000 mL | INTRAVENOUS | Status: DC | PRN

## 2014-06-12 MED ORDER — TIZANIDINE HCL 4 MG PO TABS: 4.0000 mg | ORAL_TABLET | Freq: Four times a day (QID) | ORAL | Status: DC | PRN

## 2014-06-12 MED ORDER — BUDESONIDE 0.5 MG/2ML IN SUSP: 0.2500 mg | Freq: Two times a day (BID) | RESPIRATORY_TRACT | Status: DC

## 2014-06-12 MED ORDER — IPRATROPIUM-ALBUTEROL 0.5-2.5 (3) MG/3ML IN SOLN: 3.0000 mL | RESPIRATORY_TRACT | Status: DC

## 2014-06-12 MED ORDER — SODIUM CHLORIDE 0.9 % IJ SOLN: 3.0000 mL | Freq: Two times a day (BID) | INTRAMUSCULAR | Status: DC

## 2014-06-12 MED ORDER — FLUTICASONE PROPIONATE HFA 44 MCG/ACT IN AERO: 1.0000 | INHALATION_SPRAY | Freq: Two times a day (BID) | RESPIRATORY_TRACT | Status: DC

## 2014-06-12 NOTE — Discharge Summary (Signed)
[name]    MR#:  [id number]  DATE OF BIRTH:  1955-12-16  DATE OF ADMISSION:  06/12/2014 ADMITTING PHYSICIAN: Juluis Mire, MD  DATE OF DISCHARGE: 06/13/2014  PRIMARY CARE PHYSICIAN: Lelon Huh, MD    ADMISSION DIAGNOSIS:  Chest pain, unspecified chest pain type [R07.9]  DISCHARGE DIAGNOSIS:  Chest pain-appears non cardiac -GERD HTN Dm-2 Tobacco use chronic COPD on home oxygen (2liters)  SECONDARY DIAGNOSIS:   Past Medical History  Diagnosis Date  . Menopausal symptom   . Edema   . Hip pain   . Back pain   . Asthma   . Anemia   . Insomnia   . Bronchitis   . Diabetes mellitus without complication   . Hypertension   . COPD (chronic obstructive pulmonary disease)   . Displacement of lumbar intervertebral disc without myelopathy   . Hyperglyceridemia   . Arthritis     HOSPITAL COURSE:   59 year old obese WF with h/o chornic copd on home oxygen, HTN and Dm-2 comes in with...  1. Chest pain, intermittent, ongoing since yesterday-atypical in nature.  -cardiac enzymes x2 negative -cont aspirin, nitroglycerin when necessary. -see by Dr s Chancy Milroy. Ok to go home and outpt stress test  2. Hypertension stable on home medications. Continue same  3. Diabetes mellitus type 2, stable on home medications. Continue same. Sliding scale insulin.  4. COPD stable on home medications,, continue home medications.  5. Chronic back pain, on chronic pain medications. Stable, continue home medications.  6. Hyperlipidemia. Patient not on any meds.   7. Smoking cessation advised. About 4 mins spent  DISCHARGE CONDITIONS:   fair  CONSULTS OBTAINED:  Treatment Team:  Dionisio David, MD  DRUG ALLERGIES:   Allergies  Allergen Reactions  . Albuterol   . Codeine   . Crestor [Rosuvastatin]   . Gemfibrozil   . Lipitor [Atorvastatin]   . Penicillins   . Salmeterol Xinafoate   . Yellow  Jacket Venom [Bee Venom]     DISCHARGE MEDICATIONS:   Current Discharge Medication List    CONTINUE these medications which have NOT CHANGED   Details  aspirin 81 MG tablet Take 81 mg by mouth daily.    hydrochlorothiazide (HYDRODIURIL) 25 MG tablet Take 25 mg by mouth daily.    HYDROcodone-acetaminophen (NORCO) 10-325 MG per tablet Take 1 tablet by mouth every 6 (six) hours as needed.    levalbuterol (XOPENEX HFA) 45 MCG/ACT inhaler Inhale 1 puff into the lungs every 4 (four) hours as needed for wheezing.    meloxicam (MOBIC) 15 MG tablet Take 15 mg by mouth daily.    metFORMIN (GLUCOPHAGE) 500 MG tablet Take 1,000 mg by mouth 2 (two) times daily with a meal.     zolpidem (AMBIEN) 10 MG tablet Take 5 mg by mouth at bedtime.    beclomethasone (QVAR) 40 MCG/ACT inhaler Inhale 1 puff into the lungs 2 (two) times daily.    diclofenac (FLECTOR) 1.3 % PTCH Place 1 patch onto the skin as needed.    tiZANidine (ZANAFLEX) 4 MG tablet Take 4 mg by mouth every 6 (six) hours as needed for muscle spasms.         DISCHARGE INSTRUCTIONS:    F/u dr Humphrey Rolls  tommorrow  If you experience worsening of your admission symptoms, develop shortness of breath, life threatening emergency, suicidal or homicidal thoughts you must seek medical attention immediately by calling 911  or calling your MD immediately  if symptoms less severe.  You Must read complete instructions/literature along with all the possible adverse reactions/side effects for all the Medicines you take and that have been prescribed to you. Take any new Medicines after you have completely understood and accept all the possible adverse reactions/side effects.   Please note  You were cared for by a hospitalist during your hospital stay. If you have any questions about your discharge medications or the care you received while you were in the hospital after you are discharged, you can call the unit and asked to speak with the hospitalist  on call if the hospitalist that took care of you is not available. Once you are discharged, your primary care physician will handle any further medical issues. Please note that NO REFILLS for any discharge medications will be authorized once you are discharged, as it is imperative that you return to your primary care physician (or establish a relationship with a primary care physician if you do not have one) for your aftercare needs so that they can reassess your need for medications and monitor your lab values.    Today   SUBJECTIVE   Came in with cp. No cp free. Chronic sob. Had some bloating feelig y'day   VITAL SIGNS:  Blood pressure 119/73, pulse 85, temperature 98.3 F (36.8 C), temperature source Oral, resp. rate 23, height '4\' 11"'$  (1.499 m), weight 80.65 kg (177 lb 12.8 oz), SpO2 97 %.  I/O:  No intake or output data in the 24 hours ending 06/12/14 1149  PHYSICAL EXAMINATION:  GENERAL:  59 y.o.-year-old patient lying in the bed with no acute distress.  -obesity EYES: Pupils equal, round, reactive to light and accommodation. No scleral icterus. Extraocular muscles intact.  HEENT: Head atraumatic, normocephalic. Oropharynx and nasopharynx clear.  NECK:  Supple, no jugular venous distention. No thyroid enlargement, no tenderness.  LUNGS: Normal breath sounds bilaterally, no wheezing, rales,rhonchi or crepitation. No use of accessory muscles of respiration.  CARDIOVASCULAR: S1, S2 normal. No murmurs, rubs, or gallops.  ABDOMEN: Soft, non-tender, non-distended. Bowel sounds present. No organomegaly or mass.  EXTREMITIES: No pedal edema, cyanosis, or clubbing.  NEUROLOGIC: Cranial nerves II through XII are intact. Muscle strength 5/5 in all extremities. Sensation intact. Gait not checked.  PSYCHIATRIC: The patient is alert and oriented x 3.  SKIN: No obvious rash, lesion, or ulcer.   DATA REVIEW:   CBC  Recent Labs Lab 06/12/14 0117  WBC 12.6*  HGB 13.9  HCT 37.8  PLT 426     Chemistries   Recent Labs Lab 06/12/14 0117  NA UNABLE TO REPORT DUE TO SEVERE LIPEMIA  K UNABLE TO REPORT DUE TO SEVERE LIPEMIA  CL 100*  CO2 UNABLE TO REPORT DUE TO SEVERE LIPEMIA  GLUCOSE UNABLE TO REPORT DUE TO SEVERE LIPEMIA  BUN 18  CREATININE 0.65  CALCIUM UNABLE TO REPORT DUE TO LIPEMIA  AST 59*  ALT UNABLE TO REPORT DUE TO SEVERE LIPEMIA  ALKPHOS 86  BILITOT 0.3    Cardiac Enzymes  Recent Labs Lab 06/12/14 0822  TROPONINI <0.03    Microbiology Results  No results found for this or any previous visit.  RADIOLOGY:  Dg Chest 2 View  06/12/2014   CLINICAL DATA:  Pain across the chest for 1 day. Shortness of breath due to COPD.  EXAM: CHEST  2 VIEW  COMPARISON:  12/04/2009  FINDINGS: Hyperinflation consistent with emphysema. Normal heart size and pulmonary vascularity. No focal airspace  disease or consolidation in the lungs. No blunting of costophrenic angles. No pneumothorax. Mediastinal contours appear intact.  IMPRESSION: Emphysematous changes in the lungs. No evidence of active pulmonary disease.   Electronically Signed   By: Lucienne Capers M.D.   On: 06/12/2014 01:53    EKG:   Orders placed or performed during the hospital encounter of 06/12/14  . ED EKG  . ED EKG      Management plans discussed with the patient, family and they are in agreement.  CODE STATUS:     Code Status Orders        Start     Ordered   06/12/14 0808  Full code   Continuous     06/12/14 0807      TOTAL TIME TAKING CARE OF THIS PATIENT: 35 minutes.    Jennetta Flood M.D on 06/12/2014 at 11:49 AM  Between 7am to 6pm - Pager - [phone] After 6pm go to [email or web address] - password [email or web address] [facility]  Office  [phone]  CC: Primary care physician; Lelon Huh, MD

## 2014-06-12 NOTE — Discharge Instructions (Signed)
2 gram sodium diet ADA 1800 cal diet Check your sugars at home Stop smoking

## 2014-06-12 NOTE — Progress Notes (Signed)
Yvette Riley is a 59 y.o. female  [id number]  Primary Cardiologist: Neoma Laming Reason for Consultation: Chest pain  HPI: This is a 59 year old white female with a past medical history of hypertension diabetes hyperlipidemia presented to the emergency room with chest pain. Chest pain was described as pressure type associated with shortness of breath and diaphoresis. Patient still has intermittent chest pain but is mostly on the right side. I was asked to evaluate the patient and set up for outpatient stress test. Patient has ruled out for myocardial infarction. EKG showed no acute changes.   Review of Systems: Review of Systems  Respiratory: Positive for cough and shortness of breath.   Cardiovascular: Positive for chest pain.  Neurological: Positive for dizziness.      Past Medical History  Diagnosis Date  . Menopausal symptom   . Edema   . Hip pain   . Back pain   . Asthma   . Anemia   . Insomnia   . Bronchitis   . Diabetes mellitus without complication   . Hypertension   . COPD (chronic obstructive pulmonary disease)   . Displacement of lumbar intervertebral disc without myelopathy   . Hyperglyceridemia   . Arthritis     Medications Prior to Admission  Medication Sig Dispense Refill  . aspirin 81 MG tablet Take 81 mg by mouth daily.    . hydrochlorothiazide (HYDRODIURIL) 25 MG tablet Take 25 mg by mouth daily.    [name] HYDROcodone-acetaminophen (NORCO) 10-325 MG per tablet Take 1 tablet by mouth every 6 (six) hours as needed.    . levalbuterol (XOPENEX HFA) 45 MCG/ACT inhaler Inhale 1 puff into the lungs every 4 (four) hours as needed for wheezing.    . meloxicam (MOBIC) 15 MG tablet Take 15 mg by mouth daily.    . metFORMIN (GLUCOPHAGE) 500 MG tablet Take 1,000 mg by mouth 2 (two) times daily with a meal.     . zolpidem (AMBIEN) 10 MG tablet Take 5 mg by mouth at bedtime.    . beclomethasone (QVAR) 40 MCG/ACT inhaler Inhale 1 puff into the lungs 2 (two) times  daily.    . diclofenac (FLECTOR) 1.3 % PTCH Place 1 patch onto the skin as needed.    [name] tiZANidine (ZANAFLEX) 4 MG tablet Take 4 mg by mouth every 6 (six) hours as needed for muscle spasms.       [name] aspirin EC  81 mg Oral Daily  . budesonide (PULMICORT) nebulizer solution  0.25 mg Nebulization BID  . enoxaparin (LOVENOX) injection  40 mg Subcutaneous Q24H  . hydrochlorothiazide  25 mg Oral Daily  . insulin aspart  0-9 Units Subcutaneous TID WC  . metFORMIN  1,000 mg Oral BID WC  . sodium chloride  3 mL Intravenous Q12H  . zolpidem  5 mg Oral QHS    Infusions:    Allergies  Allergen Reactions  . Albuterol   . Codeine   . Crestor [Rosuvastatin]   . Gemfibrozil   . Lipitor [Atorvastatin]   . Penicillins   . Salmeterol Xinafoate   . Yellow Jacket Venom [Bee Venom]     History   Social History  . Marital Status: Widowed    Spouse Name: N/A  . Number of Children: N/A  . Years of Education: N/A   Occupational History  . Not on file.   Social History Main Topics  . Smoking status: Current Every Day Smoker  . Smokeless tobacco: Not on file  .  Alcohol Use: No  . Drug Use: No  . Sexual Activity: Not on file   Other Topics Concern  . Not on file   Social History Narrative    Family History  Problem Relation Age of Onset  . Heart failure Mother   . Heart disease Mother   . Stroke Mother   . Heart disease Brother     PHYSICAL EXAM: Filed Vitals:   06/12/14 0819  BP: 107/60  Pulse: 87  Temp: 97.7 F (36.5 C)  Resp: 23    No intake or output data in the 24 hours ending 06/12/14 1054  General:  Well appearing. No respiratory difficulty HEENT: normal Neck: supple. no JVD. Carotids 2+ bilat; no bruits. No lymphadenopathy or thryomegaly appreciated. Cor: PMI nondisplaced. Regular rate & rhythm. No rubs, gallops or murmurs. Lungs: clear Abdomen: soft, nontender, nondistended. No hepatosplenomegaly. No bruits or masses. Good bowel sounds. Extremities: no  cyanosis, clubbing, rash, edema Neuro: alert & oriented x 3, cranial nerves grossly intact. moves all 4 extremities w/o difficulty. Affect pleasant.  ECG: EKG has no acute changes. Sinus rhythm  Results for orders placed or performed during the hospital encounter of 06/12/14 (from the past 24 hour(s))  Comprehensive metabolic panel     Status: Abnormal   Collection Time: 06/12/14  1:17 AM  Result Value Ref Range   Sodium UNABLE TO REPORT DUE TO SEVERE LIPEMIA 135 - 145 mmol/L   Potassium UNABLE TO REPORT DUE TO SEVERE LIPEMIA 3.5 - 5.1 mmol/L   Chloride 100 (L) 101 - 111 mmol/L   CO2 UNABLE TO REPORT DUE TO SEVERE LIPEMIA 22 - 32 mmol/L   Glucose, Bld UNABLE TO REPORT DUE TO SEVERE LIPEMIA 65 - 99 mg/dL   BUN 18 6 - 20 mg/dL   Creatinine, Ser 0.65 0.44 - 1.00 mg/dL   Calcium UNABLE TO REPORT DUE TO LIPEMIA 8.9 - 10.3 mg/dL   Total Protein UNABLE TO REPORT DUE TO SEVERE LIPEMIA 6.5 - 8.1 g/dL   Albumin UNABLE TO REPORT DUE TO SEVERE LIPEMIA 3.5 - 5.0 g/dL   AST 59 (H) 15 - 41 U/L   ALT UNABLE TO REPORT DUE TO SEVERE LIPEMIA 14 - 54 U/L   Alkaline Phosphatase 86 38 - 126 U/L   Total Bilirubin 0.3 0.3 - 1.2 mg/dL   GFR calc non Af Amer >60 >60 mL/min   GFR calc Af Amer >60 >60 mL/min   Anion gap UNABLE TO CALCULATE. 5 - 15  Troponin I     Status: None   Collection Time: 06/12/14  1:17 AM  Result Value Ref Range   Troponin I <0.03 <0.031 ng/mL  CBC     Status: Abnormal   Collection Time: 06/12/14  1:17 AM  Result Value Ref Range   WBC 12.6 (H) 3.6 - 11.0 K/uL   RBC 3.41 (L) 3.80 - 5.20 MIL/uL   Hemoglobin 13.9 12.0 - 16.0 g/dL   HCT 37.8 35.0 - 47.0 %   MCV 110.8 (H) 80.0 - 100.0 fL   MCH 40.7 (H) 26.0 - 34.0 pg   MCHC 36.7 (H) 32.0 - 36.0 g/dL   RDW 16.3 (H) 11.5 - 14.5 %   Platelets 426 150 - 440 K/uL  Glucose, capillary     Status: Abnormal   Collection Time: 06/12/14  8:11 AM  Result Value Ref Range   Glucose-Capillary 150 (H) 65 - 99 mg/dL  Troponin I     Status: None    Collection Time: 06/12/14  8:22 AM  Result Value Ref Range   Troponin I <0.03 <0.031 ng/mL   Dg Chest 2 View  06/12/2014   CLINICAL DATA:  Pain across the chest for 1 day. Shortness of breath due to COPD.  EXAM: CHEST  2 VIEW  COMPARISON:  12/04/2009  FINDINGS: Hyperinflation consistent with emphysema. Normal heart size and pulmonary vascularity. No focal airspace disease or consolidation in the lungs. No blunting of costophrenic angles. No pneumothorax. Mediastinal contours appear intact.  IMPRESSION: Emphysematous changes in the lungs. No evidence of active pulmonary disease.   Electronically Signed   By: Lucienne Capers M.D.   On: 06/12/2014 01:53     ASSESSMENT AND PLAN: Atypical chest pain most with the chest pain occurring mostly on the right side. Patient needs as outpatient stress test which will be set up tomorrow at 8:30 at [facility]. Patient has ruled out for myocardial infarction. She is no longer having any further chest pain. Advise being nothing by mouth tomorrow morning at 8:30 and a Lexiscan Myoview with be done in my office.[name]  KHAN,SHAUKAT A

## 2014-06-12 NOTE — H&P (Signed)
Yvette Riley at Yvette Riley NAME: Yvette Riley    MR#:  [id number]  DATE OF BIRTH:  1955-12-30  DATE OF ADMISSION:  06/12/2014  PRIMARY CARE PHYSICIAN: Yvette Huh, MD   REQUESTING/REFERRING PHYSICIAN: Darrick Riley  CHIEF COMPLAINT:   Chief Complaint  Patient presents with  . Chest Pain    Pt presents to ER alert and in NAD. Pt reports CP that began at 10 am this morning. Pt states diaphoresis, shortness of breath.    HISTORY OF PRESENT ILLNESS:  Yvette Riley  is a 59 y.o. female with below mentioned past medical history presents to the emergency room with the complaints of chest pain which started yesterday around 10 AM, intermittent, no radiation of pain. Associated with mild shortness of breath and diaphoresis. No palpitations, dizziness. Denies any fever. Chronic cough + because of for COPD but no increase. No nausea vomiting diarrhea constipation or abdominal pain. In the emergency room patient was evaluated by the ED physician and found to be with stable vital signs. EKG no acute ischemic changes. Troponin 1 negative. Patient is chest pain-free now, complaints of chronic back pain which is increasing now.  PAST MEDICAL HISTORY:   Past Medical History  Diagnosis Date  . Menopausal symptom   . Edema   . Hip pain   . Back pain   . Asthma   . Anemia   . Insomnia   . Bronchitis   . Diabetes mellitus without complication   . Hypertension   . COPD (chronic obstructive pulmonary disease)   . Displacement of lumbar intervertebral disc without myelopathy   . Hyperglyceridemia   . Arthritis     PAST SURGICAL HISTORY:   Past Surgical History  Procedure Laterality Date  . Cesarean section    . Tubal ligation      SOCIAL HISTORY:   History  Substance Use Topics  . Smoking status: Current Every Day Smoker  . Smokeless tobacco: Not on file  . Alcohol Use: No    FAMILY HISTORY:   Family History  Problem Relation  Age of Onset  . Heart failure Mother   . Heart disease Mother   . Stroke Mother   . Heart disease Brother     DRUG ALLERGIES:   Allergies  Allergen Reactions  . Albuterol   . Codeine   . Crestor [Rosuvastatin]   . Gemfibrozil   . Lipitor [Atorvastatin]   . Penicillins   . Salmeterol Xinafoate   . Yellow Jacket Venom [Bee Venom]     REVIEW OF SYSTEMS:   Review of Systems  Constitutional: Negative for fever, chills and malaise/fatigue.  HENT: Negative for ear pain, hearing loss, nosebleeds, sore throat and tinnitus.   Eyes: Negative for blurred vision, double vision, pain, discharge and redness.  Respiratory: Positive for cough. Negative for hemoptysis, sputum production, shortness of breath and wheezing.   Cardiovascular: Positive for chest pain. Negative for palpitations, orthopnea and leg swelling.  Gastrointestinal: Negative for nausea, vomiting, abdominal pain, diarrhea, constipation, blood in stool and melena.  Genitourinary: Negative for dysuria, urgency, frequency and hematuria.  Musculoskeletal: Positive for back pain. Negative for joint pain and neck pain.  Skin: Negative for itching and rash.  Neurological: Negative for dizziness, tingling, sensory change, focal weakness and seizures.  Endo/Heme/Allergies: Does not bruise/bleed easily.  Psychiatric/Behavioral: Negative for depression. The patient is not nervous/anxious.     MEDICATIONS AT HOME:   Prior to Admission medications   Medication Sig  Start Date End Date Taking? Authorizing Provider  aspirin 81 MG tablet Take 81 mg by mouth daily.   Yes Historical Provider, MD  hydrochlorothiazide (HYDRODIURIL) 25 MG tablet Take 25 mg by mouth daily.   Yes Historical Provider, MD  HYDROcodone-acetaminophen (NORCO) 10-325 MG per tablet Take 1 tablet by mouth every 6 (six) hours as needed.   Yes Historical Provider, MD  levalbuterol (XOPENEX HFA) 45 MCG/ACT inhaler Inhale 1 puff into the lungs every 4 (four) hours as  needed for wheezing.   Yes Historical Provider, MD  meloxicam (MOBIC) 15 MG tablet Take 15 mg by mouth daily.   Yes Historical Provider, MD  metFORMIN (GLUCOPHAGE) 500 MG tablet Take 1,000 mg by mouth 2 (two) times daily with a meal.    Yes Historical Provider, MD  zolpidem (AMBIEN) 10 MG tablet Take 5 mg by mouth at bedtime.   Yes Historical Provider, MD  beclomethasone (QVAR) 40 MCG/ACT inhaler Inhale 1 puff into the lungs 2 (two) times daily.    Historical Provider, MD  diclofenac (FLECTOR) 1.3 % PTCH Place 1 patch onto the skin as needed.    Historical Provider, MD  tiZANidine (ZANAFLEX) 4 MG tablet Take 4 mg by mouth every 6 (six) hours as needed for muscle spasms.    Historical Provider, MD      VITAL SIGNS:  Blood pressure 119/75, pulse 93, temperature 98.2 F (36.8 C), temperature source Oral, resp. rate 18, height '4\' 11"'$  (1.499 m), weight 86.183 kg (190 lb), SpO2 99 %.  PHYSICAL EXAMINATION:  Physical Exam  Constitutional: She is oriented to person, place, and time. She appears well-developed and well-nourished. No distress.  HENT:  Head: Normocephalic and atraumatic.  Right Ear: External ear normal.  Left Ear: External ear normal.  Nose: Nose normal.  Mouth/Throat: Oropharynx is clear and moist. No oropharyngeal exudate.  Eyes: EOM are normal. Pupils are equal, round, and reactive to light. No scleral icterus.  Neck: Normal range of motion. Neck supple. No JVD present. No thyromegaly present.  Cardiovascular: Normal rate, regular rhythm, normal heart sounds and intact distal pulses.  Exam reveals no friction rub.   No murmur heard. Respiratory: Effort normal and breath sounds normal. No respiratory distress. She has no wheezes. She has no rales. She exhibits no tenderness.  GI: Soft. Bowel sounds are normal. She exhibits no distension and no mass. There is no tenderness. There is no rebound and no guarding.  Musculoskeletal: Normal range of motion. She exhibits no edema.   Lymphadenopathy:    She has no cervical adenopathy.  Neurological: She is alert and oriented to person, place, and time. She has normal reflexes. She displays normal reflexes. No cranial nerve deficit. She exhibits normal muscle tone.  Skin: Skin is warm. No rash noted. No erythema.  Psychiatric: She has a normal mood and affect. Her behavior is normal. Thought content normal.   LABORATORY PANEL:   CBC  Recent Labs Lab 06/12/14 0117  WBC 12.6*  HGB 13.9  HCT 37.8  PLT 426   ------------------------------------------------------------------------------------------------------------------  Chemistries   Recent Labs Lab 06/12/14 0117  NA UNABLE TO REPORT DUE TO SEVERE LIPEMIA  K UNABLE TO REPORT DUE TO SEVERE LIPEMIA  CL 100*  CO2 UNABLE TO REPORT DUE TO SEVERE LIPEMIA  GLUCOSE UNABLE TO REPORT DUE TO SEVERE LIPEMIA  BUN 18  CREATININE 0.65  CALCIUM UNABLE TO REPORT DUE TO LIPEMIA  AST 59*  ALT UNABLE TO REPORT DUE TO SEVERE LIPEMIA  ALKPHOS 86  BILITOT 0.3   ------------------------------------------------------------------------------------------------------------------  Cardiac Enzymes  Recent Labs Lab 06/12/14 0117  TROPONINI <0.03   ------------------------------------------------------------------------------------------------------------------  RADIOLOGY:  Dg Chest 2 View  06/12/2014   CLINICAL DATA:  Pain across the chest for 1 day. Shortness of breath due to COPD.  EXAM: CHEST  2 VIEW  COMPARISON:  12/04/2009  FINDINGS: Hyperinflation consistent with emphysema. Normal heart size and pulmonary vascularity. No focal airspace disease or consolidation in the lungs. No blunting of costophrenic angles. No pneumothorax. Mediastinal contours appear intact.  IMPRESSION: Emphysematous changes in the lungs. No evidence of active pulmonary disease.   Electronically Signed   By: Lucienne Capers M.D.   On: 06/12/2014 01:53    EKG:   Orders placed or performed during  the hospital encounter of 06/12/14  . ED EKG normal sinus rhythm with ventricular rate of 100 bpm, no acute ST-T changes.   . ED EKG    IMPRESSION AND PLAN:   1. Chest pain, intermittent, ongoing since yesterday-atypical in nature. Rule out ACS since patient has CAD risk factors. Plan: Admit to telemetry, cycle cardiac enzymes, aspirin, nitroglycerin when necessary. Order echocardiogram, cardiology consultation requested for further advice. 2. Hypertension stable on home medications. Continue same 3. Diabetes mellitus type 2, stable on home medications. Continue same. Sliding scale insulin. 4. COPD stable on home medications,, continue home medications. 5. Chronic back pain, on chronic pain medications. Stable, continue home medications. 6. Hyperlipidemia. Patient not on any meds. Check fasting lipids follow-up accordingly.    All the records are reviewed and case discussed with ED provider. Management plans discussed with the patient, family and they are in agreement.  CODE STATUS: Full code  TOTAL TIME TAKING CARE OF THIS PATIENT: 50 minutes.    Juluis Mire M.D on 06/12/2014 at 6:42 AM  Between 7am to 6pm - Pager - [phone]  After 6pm go to [email or web address] - password [email or web address] [facility]  Office  [phone]  CC: Primary care physician; Yvette Huh, MD

## 2014-06-12 NOTE — ED Provider Notes (Signed)
[facility] Emergency Department Provider Note  ____________________________________________  Time seen: Approximately 3:13 AM  I have reviewed the triage vital signs and the nursing notes.   HISTORY  Chief Complaint Chest Pain    HPI Yvette Riley is a 59 y.o. female with COPD with 2 L chronic home oxygen requirement, diabetes, hypertension, hypertriglyceridemia who presents for evaluation of 12 hours of intermittent chest pain. The patient reports that she has had at least 3 episodes of sudden sharp central chest pain associated with shortness of breath, diaphoresis. Her symptoms are worsened with exertion. Currently she is chest pain-free. Symptoms last from one to 2 hours. She denies any recent changes in medication. She reports that she was supposed to have a nuclear stress test done in March of this year which was recommended by Dr. Fletcher Anon in clinic on 03/28/2014, however, for personal reasons, she was unable to have this performed.  Past Medical History  Diagnosis Date  . Menopausal symptom   . Edema   . Hip pain   . Back pain   . Asthma   . Anemia   . Insomnia   . Bronchitis   . Diabetes mellitus without complication   . Hypertension   . COPD (chronic obstructive pulmonary disease)   . Displacement of lumbar intervertebral disc without myelopathy   . Hyperglyceridemia   . Arthritis     Patient Active Problem List   Diagnosis Date Noted  . Chest pain 06/12/2014  . COPD (chronic obstructive pulmonary disease) 06/12/2014  . DM (diabetes mellitus) 06/12/2014  . Back pain 06/12/2014  . Pain in the chest 03/28/2014  . Essential hypertension 03/28/2014  . Tobacco use 03/28/2014    Past Surgical History  Procedure Laterality Date  . Cesarean section    . Tubal ligation      Current Outpatient Rx  Name  Route  Sig  Dispense  Refill  . aspirin 81 MG tablet   Oral   Take 81 mg by mouth daily.         . hydrochlorothiazide  (HYDRODIURIL) 25 MG tablet   Oral   Take 25 mg by mouth daily.         [name] HYDROcodone-acetaminophen (NORCO) 10-325 MG per tablet   Oral   Take 1 tablet by mouth every 6 (six) hours as needed.         . levalbuterol (XOPENEX HFA) 45 MCG/ACT inhaler   Inhalation   Inhale 1 puff into the lungs every 4 (four) hours as needed for wheezing.         . meloxicam (MOBIC) 15 MG tablet   Oral   Take 15 mg by mouth daily.         . metFORMIN (GLUCOPHAGE) 500 MG tablet   Oral   Take 1,000 mg by mouth 2 (two) times daily with a meal.          . zolpidem (AMBIEN) 10 MG tablet   Oral   Take 5 mg by mouth at bedtime.         . beclomethasone (QVAR) 40 MCG/ACT inhaler   Inhalation   Inhale 1 puff into the lungs 2 (two) times daily.         . diclofenac (FLECTOR) 1.3 % PTCH   Transdermal   Place 1 patch onto the skin as needed.         [name] tiZANidine (ZANAFLEX) 4 MG tablet   Oral   Take 4 mg by mouth every  6 (six) hours as needed for muscle spasms.           Allergies Albuterol; Codeine; Crestor; Gemfibrozil; Lipitor; Penicillins; Salmeterol xinafoate; and Yellow jacket venom  Family History  Problem Relation Age of Onset  . Heart failure Mother   . Heart disease Mother   . Stroke Mother   . Heart disease Brother     Social History History  Substance Use Topics  . Smoking status: Current Every Day Smoker  . Smokeless tobacco: Not on file  . Alcohol Use: No    Review of Systems Constitutional: No fever/chills Eyes: No visual changes. ENT: No sore throat. Cardiovascular: + chest pain. Respiratory: +shortness of breath. Gastrointestinal: No abdominal pain.  No nausea, no vomiting.  No diarrhea.  No constipation. Genitourinary: Negative for dysuria. Musculoskeletal: Negative for back pain. Skin: Negative for rash. Neurological: Negative for headaches, focal weakness or numbness.  10-point ROS otherwise  negative.  ____________________________________________   PHYSICAL EXAM:  VITAL SIGNS: ED Triage Vitals  Enc Vitals Group     BP 06/12/14 0055 130/66 mmHg     Pulse Rate 06/12/14 0055 100     Resp 06/12/14 0055 20     Temp 06/12/14 0055 98.2 F (36.8 C)     Temp Source 06/12/14 0054 Oral     SpO2 06/12/14 0055 94 %     Weight 06/12/14 0054 190 lb (86.183 kg)     Height 06/12/14 0054 '4\' 11"'$  (1.499 m)     Head Cir --      Peak Flow --      Pain Score 06/12/14 0054 6     Pain Loc --      Pain Edu? --      Excl. in [place]? --     Constitutional: Alert and oriented. Well appearing and in no acute distress. Eyes: Conjunctivae are normal. PERRL. EOMI. Head: Atraumatic. Nose: No congestion/rhinnorhea. Mouth/Throat: Mucous membranes are moist.  Oropharynx non-erythematous. Neck: No stridor.  Cardiovascular: Normal rate, regular rhythm. Grossly normal heart sounds.  Good peripheral circulation. Respiratory: Normal respiratory effort.  No retractions. Lungs CTAB. Gastrointestinal: Soft and nontender. No distention. No abdominal bruits. No CVA tenderness. Genitourinary:  Deferred Musculoskeletal: No lower extremity tenderness nor edema.  No joint effusions. Neurologic:  Normal speech and language. No gross focal neurologic deficits are appreciated. Speech is normal. No gait instability. Skin:  Skin is warm, dry and intact. No rash noted. Psychiatric: Mood and affect are normal. Speech and behavior are normal.  ____________________________________________   LABS (all labs ordered are listed, but only abnormal results are displayed)  Labs Reviewed  COMPREHENSIVE METABOLIC PANEL - Abnormal; Notable for the following:    Chloride 100 (*)    AST 59 (*)    All other components within normal limits  CBC - Abnormal; Notable for the following:    WBC 12.6 (*)    RBC 3.41 (*)    MCV 110.8 (*)    MCH 40.7 (*)    MCHC 36.7 (*)    RDW 16.3 (*)    All other components within normal  limits  TROPONIN I   ____________________________________________  EKG  ED ECG REPORT   Date: 06/12/2014  EKG Time: 01:07  Rate: 100  Rhythm: normal sinus rhythm  Axis: Normal  Intervals:none  ST&T Change: Q waves in V1, V2 no acute ST segment elevation  ____________________________________________  RADIOLOGY  CXR: IMPRESSION: Emphysematous changes in the lungs. No evidence of active pulmonary disease.  ____________________________________________  PROCEDURES  Procedure(s) performed: None  Critical Care performed: No  ____________________________________________   INITIAL IMPRESSION / ASSESSMENT AND PLAN / ED COURSE  Pertinent labs & imaging results that were available during my care of the patient were reviewed by me and considered in my medical decision making (see chart for details).  Yvette Riley is a 60 y.o. female with COPD with 2 L chronic home oxygen requirement, diabetes, hypertension, hypertriglyceridemia who presents for evaluation of 12 hours of intermittent chest pain. Currently she is chest pain-free. Not consistent with acute aortic dissection or pulmonary embolism. Given concern for ACS due to multiple cardiac risk factors, severe exertional dyspnea, no prior provocative testing, will discuss with hospitalist for admission.  ----------------------------------------- 5:33 AM on 06/12/2014 -----------------------------------------  1st troponin negative. D/W hospitalist for admission. ____________________________________________   FINAL CLINICAL IMPRESSION(S) / ED DIAGNOSES  Final diagnoses:  Chest pain, unspecified chest pain type      Joanne Gavel, MD 06/12/14 [phone]

## 2014-06-12 NOTE — Progress Notes (Signed)
Pt alert and oriented. Vitals stable.  NSR on monitor.  No complaint of chest pain.  Pt has scheduled stress test in the am.  Pt discharge with son by wheelchair.  IV and telemetry removed. Prescriptions given to patient.  Discharge instructions given.  Patient had no questions.

## 2014-06-12 NOTE — ED Notes (Signed)
Pt presents to ER alert and in NAD. Pt reports CP that began at 10 am this morning. Pt states diaphoresis, shortness of breath.

## 2014-06-17 DIAGNOSIS — J449 Chronic obstructive pulmonary disease, unspecified: Secondary | ICD-10-CM | POA: Diagnosis not present

## 2014-06-17 DIAGNOSIS — E781 Pure hyperglyceridemia: Secondary | ICD-10-CM | POA: Diagnosis not present

## 2014-06-17 DIAGNOSIS — R079 Chest pain, unspecified: Secondary | ICD-10-CM | POA: Diagnosis not present

## 2014-06-28 DIAGNOSIS — R079 Chest pain, unspecified: Secondary | ICD-10-CM | POA: Diagnosis not present

## 2014-07-04 DIAGNOSIS — R079 Chest pain, unspecified: Secondary | ICD-10-CM | POA: Diagnosis not present

## 2014-07-04 DIAGNOSIS — I10 Essential (primary) hypertension: Secondary | ICD-10-CM | POA: Diagnosis not present

## 2014-07-08 ENCOUNTER — Other Ambulatory Visit: Payer: Self-pay | Admitting: Family Medicine

## 2014-07-08 NOTE — Telephone Encounter (Signed)
Pt stated that she was in the bathroom with her medication and he dog ran into her and her medication bottle fell in the toilet. Pt stated was able to catch about 6 but the rest fell in the toilet with the bottle. Pt would like to pick up a new RX today if possible. Thanks TNP

## 2014-07-08 NOTE — Telephone Encounter (Signed)
Pt called to see if medication was ready. I advised that it was not. Pt is concerned that she is going to have to go the weekend without her pain medication. Thanks TNP

## 2014-07-12 NOTE — Telephone Encounter (Signed)
Pt called to see of the RX for hydrocodone was ready for pick up. I advised that it was not. Pt stated that she is completely out and she has been taking Tylenol and it isn't helping with her pain. Pt would like a call back to let her know when this will be ready. Thanks TNP

## 2014-07-13 ENCOUNTER — Other Ambulatory Visit: Payer: Self-pay | Admitting: Family Medicine

## 2014-07-13 MED ORDER — HYDROCODONE-ACETAMINOPHEN 10-325 MG PO TABS: 1.0000 | ORAL_TABLET | Freq: Four times a day (QID) | ORAL | Status: DC | PRN

## 2014-07-13 NOTE — Telephone Encounter (Signed)
Rx ready. Patient aware.

## 2014-07-13 NOTE — Telephone Encounter (Signed)
Pt called to see if RX was ready and to let Dr. Caryn Section know that she has her stress test done tomorrow and she will have the results sent here. Thanks TNP

## 2014-07-13 NOTE — Telephone Encounter (Signed)
Pt called about RX. Per Dr. Caryn Section it will be ready this afternoon and pt was advised. Thanks TNP

## 2014-07-14 DIAGNOSIS — R9430 Abnormal result of cardiovascular function study, unspecified: Secondary | ICD-10-CM | POA: Diagnosis not present

## 2014-07-18 ENCOUNTER — Telehealth: Payer: Self-pay | Admitting: Family Medicine

## 2014-07-18 DIAGNOSIS — R079 Chest pain, unspecified: Secondary | ICD-10-CM | POA: Diagnosis not present

## 2014-07-18 DIAGNOSIS — K210 Gastro-esophageal reflux disease with esophagitis: Secondary | ICD-10-CM | POA: Diagnosis not present

## 2014-07-18 NOTE — Telephone Encounter (Signed)
Pt would like to speak with a nurse about why her Hydrocodone RX was changed from a 30 day RX to a 45 day but the amount of pills she got didn't change. Thanks TNP

## 2014-07-19 NOTE — Telephone Encounter (Signed)
The sig changed from Q4-6 hours to Q6 hours due to limitations of the new computer system. This causes the computers at there pharmacies to change to Days Supply. We can change it to Q4 hours with her next refill if we need to.

## 2014-07-19 NOTE — Telephone Encounter (Signed)
Pt advised as directed below.   Thanks,   -Kabeer Hoagland  

## 2014-07-23 ENCOUNTER — Other Ambulatory Visit: Payer: Self-pay | Admitting: Family Medicine

## 2014-08-04 ENCOUNTER — Other Ambulatory Visit: Payer: Self-pay | Admitting: Family Medicine

## 2014-08-04 DIAGNOSIS — M549 Dorsalgia, unspecified: Secondary | ICD-10-CM

## 2014-08-04 NOTE — Telephone Encounter (Signed)
Pt contacted office for refill request on the following medications:  HYDROcodone-acetaminophen ([place]) 10-325 MG per tablet  Pt called back because she didn't hear back from when she call on 07/18/14. I advised pt that the days supply changed due to the new computer system and pt request that we go ahead and write her a RX with the 30 day supply and quantity that she usually gets because she is about to run out of the medication. I advised pt that Dr. Caryn Section is out of the office this afternoon. Thanks TNP

## 2014-08-05 ENCOUNTER — Telehealth: Payer: Self-pay | Admitting: Family Medicine

## 2014-08-05 MED ORDER — HYDROCODONE-ACETAMINOPHEN 10-325 MG PO TABS: ORAL_TABLET | ORAL | Status: DC

## 2014-08-05 NOTE — Telephone Encounter (Signed)
Patient states that pharmacist told her that Rx is for a 45 day supply and thay she would not be able to fill new Rx till 45 days after last Rx was filled. Attempted to contact pharmacy was on hold for several minutes with no response. Will try contacting again Monday.

## 2014-08-05 NOTE — Telephone Encounter (Signed)
Please review

## 2014-08-05 NOTE — Telephone Encounter (Signed)
Left message for patient. Looking back in previous shart this patient has been prescribed this medication by Dr. Caryn Section dating back to 2015 sig and quantity has always remained the same I am unsure what the concern is. I left message letting her know to contact office back if she has questions or if there is any issue with insurance covering Rx advised to take 1 tablet every 4-6hrs as needed.

## 2014-08-05 NOTE — Telephone Encounter (Signed)
Pt is questioning her pain medication refill.  She said she called the pharmacy and they said her RX was written for 45 days.  She said it is usually written for 30 days.  She would like for some one to clarify this for her.  Her call back [phone].  Thanks, Tp

## 2014-08-08 ENCOUNTER — Other Ambulatory Visit: Payer: Self-pay | Admitting: Family Medicine

## 2014-08-08 NOTE — Telephone Encounter (Signed)
Spoke with pharmacist who states that patient had medication filled on 07/13/14 and that script is for 30days and that she would not refill patients medication no earlier than 3 days before which would be 08/09/14. I notified patient of this and she intends on getting medication filled tomorrow.

## 2014-08-11 ENCOUNTER — Telehealth: Payer: Self-pay | Admitting: Family Medicine

## 2014-08-11 ENCOUNTER — Other Ambulatory Visit: Payer: Self-pay | Admitting: Family Medicine

## 2014-08-11 DIAGNOSIS — G4700 Insomnia, unspecified: Secondary | ICD-10-CM | POA: Insufficient documentation

## 2014-08-11 NOTE — Telephone Encounter (Signed)
Pt said she called about a refill several days ago but pharmacy said they have not heard anything  generic ambien  '10mg'$  zolpidem Tartrate...uses [facility] in [place].

## 2014-08-11 NOTE — Telephone Encounter (Signed)
Please call in zolpidem  

## 2014-08-11 NOTE — Telephone Encounter (Signed)
Last OV 05/2014  Thanks,   -Jevon Shells  

## 2014-08-11 NOTE — Telephone Encounter (Signed)
Pt contacted office for refill request on the following medications:  zolpidem (AMBIEN) 10 MG tablet.  [place].  Pt states the pharmacy did not receive the Rx sent 08/08/2014.  TJ#[phone]

## 2014-08-12 NOTE — Telephone Encounter (Signed)
Rx was phoned into pharmacy.

## 2014-08-17 ENCOUNTER — Other Ambulatory Visit: Payer: Self-pay | Admitting: Family Medicine

## 2014-08-17 NOTE — Telephone Encounter (Signed)
Pt contacted office for refill request on the following medications: [employer]ayer Contour test strips.  Pt test 2 times a day.  [facility] [place].  [phone]

## 2014-08-18 MED ORDER — GLUCOSE BLOOD VI STRP: 1.0000 | ORAL_STRIP | Status: DC | PRN

## 2014-09-02 ENCOUNTER — Telehealth: Payer: Self-pay | Admitting: Family Medicine

## 2014-09-02 DIAGNOSIS — M549 Dorsalgia, unspecified: Secondary | ICD-10-CM

## 2014-09-02 MED ORDER — HYDROCODONE-ACETAMINOPHEN 10-325 MG PO TABS: ORAL_TABLET | ORAL | Status: DC

## 2014-09-02 NOTE — Telephone Encounter (Signed)
See below please. 

## 2014-09-02 NOTE — Telephone Encounter (Signed)
Pt call ed for refill on her HYDROCODONE/ACETAMINOPHEN 10-325 T  Thanks, teri

## 2014-09-06 ENCOUNTER — Other Ambulatory Visit: Payer: Self-pay | Admitting: Family Medicine

## 2014-09-06 NOTE — Telephone Encounter (Signed)
Pt contacted office for refill request on the following medications: levalbuterol (XOPENEX HFA) 45 MCG/ACT inhaler to [facility] pt wanted to know if she could change over to a nebulizer and if it would be possible to get it from a medical supply company if it is covered under Medicare. Thanks TNP

## 2014-09-07 ENCOUNTER — Other Ambulatory Visit: Payer: Self-pay | Admitting: Family Medicine

## 2014-09-07 NOTE — Telephone Encounter (Signed)
Have sent refill for Xopenex to [facility]. Why does she think she need a nebulizer?

## 2014-09-13 ENCOUNTER — Telehealth: Payer: Self-pay | Admitting: *Deleted

## 2014-09-13 NOTE — Telephone Encounter (Signed)
This message is in regards to patient's previous message. Patient requested a nebulizer because she thinks it will be more effective than her inhaler.

## 2014-09-27 DIAGNOSIS — M25561 Pain in right knee: Secondary | ICD-10-CM

## 2014-09-27 DIAGNOSIS — E781 Pure hyperglyceridemia: Secondary | ICD-10-CM | POA: Insufficient documentation

## 2014-09-27 DIAGNOSIS — J309 Allergic rhinitis, unspecified: Secondary | ICD-10-CM | POA: Insufficient documentation

## 2014-09-27 DIAGNOSIS — J45909 Unspecified asthma, uncomplicated: Secondary | ICD-10-CM

## 2014-09-27 DIAGNOSIS — Z720 Tobacco use: Secondary | ICD-10-CM

## 2014-09-27 DIAGNOSIS — M546 Pain in thoracic spine: Secondary | ICD-10-CM | POA: Insufficient documentation

## 2014-09-27 DIAGNOSIS — M25559 Pain in unspecified hip: Secondary | ICD-10-CM

## 2014-09-27 DIAGNOSIS — H409 Unspecified glaucoma: Secondary | ICD-10-CM | POA: Insufficient documentation

## 2014-09-27 DIAGNOSIS — D649 Anemia, unspecified: Secondary | ICD-10-CM | POA: Insufficient documentation

## 2014-09-27 DIAGNOSIS — D509 Iron deficiency anemia, unspecified: Secondary | ICD-10-CM

## 2014-09-27 DIAGNOSIS — M5126 Other intervertebral disc displacement, lumbar region: Secondary | ICD-10-CM

## 2014-09-30 ENCOUNTER — Telehealth: Payer: Self-pay | Admitting: Family Medicine

## 2014-09-30 ENCOUNTER — Ambulatory Visit (INDEPENDENT_AMBULATORY_CARE_PROVIDER_SITE_OTHER): Payer: Medicare Other | Admitting: Family Medicine

## 2014-09-30 ENCOUNTER — Encounter: Payer: Self-pay | Admitting: Family Medicine

## 2014-09-30 VITALS — BP 114/78 | HR 94 | Temp 98.7°F | Resp 16 | Wt 178.0 lb

## 2014-09-30 DIAGNOSIS — E119 Type 2 diabetes mellitus without complications: Secondary | ICD-10-CM

## 2014-09-30 DIAGNOSIS — M549 Dorsalgia, unspecified: Secondary | ICD-10-CM | POA: Diagnosis not present

## 2014-09-30 DIAGNOSIS — J449 Chronic obstructive pulmonary disease, unspecified: Secondary | ICD-10-CM

## 2014-09-30 LAB — POCT GLYCOSYLATED HEMOGLOBIN (HGB A1C)
Est. average glucose Bld gHb Est-mCnc: 128
Hemoglobin A1C: 6.1

## 2014-09-30 MED ORDER — TIZANIDINE HCL 4 MG PO TABS: 4.0000 mg | ORAL_TABLET | Freq: Four times a day (QID) | ORAL | Status: DC | PRN

## 2014-09-30 MED ORDER — OXYCODONE-ACETAMINOPHEN 7.5-325 MG PO TABS
1.0000 | ORAL_TABLET | ORAL | Status: DC | PRN
Start: 2014-09-30 — End: 2014-10-14

## 2014-09-30 MED ORDER — METFORMIN HCL ER (MOD) 500 MG PO TB24: 500.0000 mg | ORAL_TABLET | Freq: Two times a day (BID) | ORAL | Status: DC

## 2014-09-30 NOTE — Progress Notes (Signed)
Patient: Yvette Riley Female    DOB: 02-29-1955   59 y.o.   MRN: [id number] Visit Date: 09/30/2014  Today's Provider: Lelon Huh, MD   Chief Complaint  Patient presents with  . Follow-up  . Hypertension  . Diabetes  . COPD   Subjective:    Diabetes Hypoglycemia symptoms include headaches.     Follow-up for copd from 06/17/2014; overnight oximetry ordered . Follow-up for hyperglyceridemia, pure from 06/17/2014; restarted Livalo 2 mg 1/2 tab bid.   Diabetes Mellitus Type II, Follow-up:   Lab Results  Component Value Date   HGBA1C 7.2* 02/24/2014   Last seen for diabetes 8 months ago.  Management changes included none. She reports good compliance with treatment. She is not having side effects. none Current symptoms include none and have been unchanged. Home blood sugar records: non-fasting 125  Episodes of hypoglycemia? no   Current Insulin Regimen: n/a Most Recent Eye Exam: 04/2014 Weight trend: decreasing steadily Prior visit with dietician: no Current diet: well balanced Current exercise: none  ------------------------------------------------------------------------   Hypertension, follow-up:  BP Readings from Last 3 Encounters:  09/30/14 114/78  09/27/14 120/66  06/12/14 119/73    She was last seen for hypertension 16month ago.  BP at that visit was 120/66. Management changes since that visit include n/a .She reports good compliance with treatment. She is not having side effects. none  She is not exercising. She is not adherent to low salt diet.   Outside blood pressures are n/a. She is experiencing none.  Patient denies none.   Cardiovascular risk factors include diabetes mellitus.  Use of agents associated with hypertension: none.   ------------------------------------------------------------------------     Pertinent Labs:    Component Value Date/Time   CREATININE 0.65 06/12/2014 0117   CREATININE 0.68 02/02/2012 1114    Wt  Readings from Last 3 Encounters:  09/30/14 178 lb (80.74 kg)  09/27/14 187 lb (84.823 kg)  06/12/14 177 lb 12.8 oz (80.65 kg)    Back pain She has history of herniated lumbar disc on chronic hydrocodone. No states pain has been worsening and radiating into both LEs. R>L. She doesn't feel hydrocodone is working anymore and wants to try different medication   COPD She continues to use albuterol nebulizer which she feels works well. She has been trying to get approval for home oxygen therapy. Apparently she has old concentrator which is not working very well. She was scheduled for overnight oximetry after her last visit in May, but apparently she had scheduling conflict and is in process of rescheduling.   Allergies  Allergen Reactions  . Albuterol   . Codeine   . Crestor [Rosuvastatin]   . Gemfibrozil   . Lipitor [Atorvastatin]   . Penicillins   . Salmeterol Xinafoate   . Yellow Jacket Venom [Bee Venom]    Previous Medications   ASPIRIN 81 MG TABLET    Take 81 mg by mouth daily.   ASPIRIN EC 81 MG EC TABLET    Take 1 tablet (81 mg total) by mouth daily.   BECLOMETHASONE (QVAR) 40 MCG/ACT INHALER    Inhale 1 puff into the lungs 2 (two) times daily.   DICLOFENAC (FLECTOR) 1.3 % PTCH    Place 1 patch onto the skin as needed.   GLUCOSE BLOOD (BAYER CONTOUR TEST) TEST STRIP    1 each by Other route as needed for other. Use as instructed   HYDROCHLOROTHIAZIDE (HYDRODIURIL) 25 MG TABLET    Take  25 mg by mouth daily.   HYDROCODONE-ACETAMINOPHEN (NORCO) 10-325 MG PER TABLET    1 tablet every 4-6 hours as needed   MELOXICAM (MOBIC) 15 MG TABLET    TAKE 1 TABLET BY MOUTH EVERY DAY   METFORMIN (GLUCOPHAGE) 500 MG TABLET    Take 1,000 mg by mouth 2 (two) times daily with a meal.    METOPROLOL (LOPRESSOR) 100 MG TABLET       PANTOPRAZOLE (PROTONIX) 40 MG TABLET    TK 1 T PO ONCE D   TIZANIDINE (ZANAFLEX) 4 MG TABLET    Take 4 mg by mouth every 6 (six) hours as needed for muscle spasms.   XOPENEX  HFA 45 MCG/ACT INHALER    INHALE 1 PUFF BY MOUTH EVERY 4 HOURS AS NEEDED   ZOLPIDEM (AMBIEN) 10 MG TABLET    TAKE 1/2 TO 1 TABLET BY MOUTH EVERY NIGHT AT BEDTIME    Review of Systems  Cardiovascular: Negative.   Neurological: Positive for light-headedness and headaches.       Lightheadedness occasionally    Social History  Substance Use Topics  . Smoking status: Current Every Day Smoker -- 1.00 packs/day  . Smokeless tobacco: Not on file  . Alcohol Use: No   Objective:   BP 114/78 mmHg  Pulse 94  Temp(Src) 98.7 F (37.1 C) (Oral)  Resp 16  Wt 178 lb (80.74 kg)  SpO2 94% on 2.5 lmp  Physical Exam  General Appearance:    Alert, cooperative, no distress  Eyes:    PERRL, conjunctiva/corneas clear, EOM's intact       Lungs:     Clear to auscultation bilaterally, respirations unlabored, lung sounds distant.   Heart:    Regular rate and rhythm  Neurologic:   Awake, alert, oriented x 3. No apparent focal neurological           defect.   MS:    Mild tenderness over lumbar spine.    Results for orders placed or performed in visit on 09/30/14  POCT glycosylated hemoglobin (Hb A1C)  Result Value Ref Range   Hemoglobin A1C 6.1    Est. average glucose Bld gHb Est-mCnc 128    Results for orders placed or performed in visit on 09/30/14  POCT glycosylated hemoglobin (Hb A1C)  Result Value Ref Range   Hemoglobin A1C 6.1    Est. average glucose Bld gHb Est-mCnc 128        Assessment & Plan:     1. Type 2 diabetes mellitus without complication well controlled  - POCT glycosylated hemoglobin (Hb A1C) - metFORMIN (GLUMETZA) 500 MG (MOD) 24 hr tablet; Take 1 tablet (500 mg total) by mouth 2 (two) times daily with a meal.  Dispense: 360 tablet; Refill: 1  2. Back pain with radiation  - tiZANidine (ZANAFLEX) 4 MG tablet; Take 1 tablet (4 mg total) by mouth every 6 (six) hours as needed for muscle spasms.  Dispense: 30 tablet; Refill: 5 - oxyCODONE-acetaminophen (PERCOCET) 7.5-325 MG  per tablet; Take 1 tablet by mouth every 4 (four) hours as needed for severe pain.  Dispense: 90 tablet; Refill: 0 - MR Lumbar Spine Wo Contrast; Future  3. Chronic obstructive pulmonary disease, unspecified COPD, unspecified chronic bronchitis type  - Pulse oximetry, overnight; Future       Lelon Huh, MD  [facility]

## 2014-09-30 NOTE — Telephone Encounter (Signed)
Pt contacted office for refill request on the following medications: HYDROcodone-acetaminophen (NORCO) 10-325 MG per tablet. Pt is coming for her F/U this afternoon but wanted to go ahead and request this to be printed so she can get it at her visit this afternoon. Thanks TNP

## 2014-10-05 NOTE — Telephone Encounter (Signed)
They are both narcotics, so she cannot take both of them. If she feels that hydrocodone worked better then we can change back when oxycodone is out. Cannot write another narcotic prescription until 10-18-2014. If she likes we can refer her to pain clinic if she doesn't feel like meds are working.

## 2014-10-05 NOTE — Telephone Encounter (Signed)
Pt called asking for a refill for HYDROcodone-acetaminophen (NORCO) 10-325 MG per tablet. I advised that it looked like she was changed to Oxycodone 7.5-325. Pt stated that she wasn't aware that the Hydrocodone would stop with taking this new medication. Pt stated that the Oxycodone was taking the edge off but isn't helping as long as the Hydrocodone. Pt wanted to know if she could get a refill or up the Oxycodone to 10-325. Please advise. Thanks TNP

## 2014-10-06 ENCOUNTER — Ambulatory Visit
Admission: RE | Admit: 2014-10-06 | Discharge: 2014-10-06 | Disposition: A | Discharge status: Home / Self Care | Payer: Medicare Other | Source: Ambulatory Visit | Attending: Family Medicine | Admitting: Family Medicine

## 2014-10-06 DIAGNOSIS — M5136 Other intervertebral disc degeneration, lumbar region: Secondary | ICD-10-CM | POA: Diagnosis not present

## 2014-10-06 DIAGNOSIS — M4806 Spinal stenosis, lumbar region: Secondary | ICD-10-CM | POA: Diagnosis not present

## 2014-10-06 DIAGNOSIS — M5126 Other intervertebral disc displacement, lumbar region: Secondary | ICD-10-CM | POA: Diagnosis not present

## 2014-10-06 DIAGNOSIS — J449 Chronic obstructive pulmonary disease, unspecified: Secondary | ICD-10-CM | POA: Diagnosis not present

## 2014-10-06 DIAGNOSIS — M545 Low back pain: Secondary | ICD-10-CM | POA: Diagnosis not present

## 2014-10-06 DIAGNOSIS — M5416 Radiculopathy, lumbar region: Secondary | ICD-10-CM | POA: Diagnosis present

## 2014-10-06 NOTE — Telephone Encounter (Signed)
Patient notified. Patient stated that she would prefer to go back on hydrocodone. Patient does not want to go to pain clinic.

## 2014-10-07 ENCOUNTER — Encounter: Payer: Self-pay | Admitting: Family Medicine

## 2014-10-10 ENCOUNTER — Telehealth: Payer: Self-pay | Admitting: Family Medicine

## 2014-10-10 DIAGNOSIS — M5126 Other intervertebral disc displacement, lumbar region: Secondary | ICD-10-CM

## 2014-10-10 NOTE — Telephone Encounter (Signed)
Please advise MRI results.

## 2014-10-10 NOTE — Telephone Encounter (Signed)
-----   Message from Birdie Sons, MD sent at 10/10/2014 11:25 AM EDT ----- small disk protrusions of lumbar spine, a little larger than previous MRI. She should follow up with back specialist. recommend referral to neurosurgery.

## 2014-10-10 NOTE — Telephone Encounter (Signed)
Pt called for her MRI results from last week.  Her Call back is  [phone]  Thanks Con Memos

## 2014-10-10 NOTE — Telephone Encounter (Signed)
Patient notified of results. Expressed understanding. The patient is agreeable to the referral to neurosurgery. The patient requested to be referred to someone in [place].

## 2014-10-11 ENCOUNTER — Telehealth: Payer: Self-pay | Admitting: Family Medicine

## 2014-10-11 NOTE — Telephone Encounter (Signed)
Pt states she is need to get a walker to help her get around due to her balance.  YW#[phone]/[id number]

## 2014-10-11 NOTE — Telephone Encounter (Signed)
Please advise 

## 2014-10-12 NOTE — Telephone Encounter (Signed)
Pt called back to get an up date about getting orders for a walker. Thanks TNP

## 2014-10-13 ENCOUNTER — Telehealth: Payer: Self-pay | Admitting: Family Medicine

## 2014-10-13 NOTE — Telephone Encounter (Signed)
Please advise 

## 2014-10-13 NOTE — Telephone Encounter (Signed)
Pt states Dr Caryn Section is going to change her pain medication back to the original medication.  Pt states she was told she could not get a new Rx until 10/18/2014 but she does not have enough pain medication to last until 10/18/14.  Pt is asking what she needs to do about having enough medication to last until the 20th.  [id number]#[phone]

## 2014-10-14 ENCOUNTER — Other Ambulatory Visit: Payer: Self-pay | Admitting: Family Medicine

## 2014-10-14 MED ORDER — HYDROCODONE-ACETAMINOPHEN 10-325 MG PO TABS: 1.0000 | ORAL_TABLET | Freq: Four times a day (QID) | ORAL | Status: DC | PRN

## 2014-10-14 NOTE — Telephone Encounter (Signed)
Pt called back about order for walker and RX. Pt was advised and plans to come in before 5 pm today. Thanks TNP

## 2014-10-14 NOTE — Telephone Encounter (Signed)
Prescription for hydrocodone and prescription for walker are ready to pick up. She will need to take prescription for walker to a medical supply store.

## 2014-10-17 ENCOUNTER — Telehealth: Payer: Self-pay | Admitting: Family Medicine

## 2014-10-17 NOTE — Telephone Encounter (Signed)
Pt stated that she received the pulse sox and wants to know when doing the overnight oximetry if to do it with O2 or at R/A.  And for how many hrs. She stated that she uses O2 at 2.5 L overnight and prn during the day.  Please advise.  Thanks,

## 2014-10-17 NOTE — Telephone Encounter (Signed)
She needs to do overnight oximetry on room to determine if O2 drops enough to require supplemental oxygen.

## 2014-10-17 NOTE — Telephone Encounter (Signed)
Pt states she questions about her using her oxygen at night.  DI#[phone]/[id number]

## 2014-10-17 NOTE — Telephone Encounter (Signed)
Pt advised as directed below, pt verbalized fully understanding and stated that she will call back if any questions or concerns.   Thanks,

## 2014-10-18 ENCOUNTER — Other Ambulatory Visit: Payer: Self-pay | Admitting: Family Medicine

## 2014-10-18 MED ORDER — HYDROCODONE-ACETAMINOPHEN 10-325 MG PO TABS: ORAL_TABLET | ORAL | Status: DC

## 2014-10-18 NOTE — Telephone Encounter (Signed)
Left message to call back.  Thanks, 

## 2014-10-18 NOTE — Telephone Encounter (Signed)
Pt called saying she has been having problems with her pain medication prescriptions. They (the pharmacy) is telling her its for 45 days and it is suppose to be 30 days.  The rx will not last 45 days the way sh eis taking it.  For that amount of pills.  HYDROcodone-acetaminophen (NORCO) 10-325 MG per tablet 10/14/14 -- Birdie Sons, MD Take 1 tablet by mouth every 6 (six) hours as needed.  Her call back is [phone]  Thanks Con Memos

## 2014-10-18 NOTE — Telephone Encounter (Signed)
We can change the directions on the next prescription to allow it to be filled 30 days from now.

## 2014-10-18 NOTE — Telephone Encounter (Signed)
Pt concern for frequency on her hydrocodone-acetaminophen (norco) 10-325 new prescription, she stated that her previous prescriptions were for every 4 - 6 hrs PRN and that they were for 30 day supply. She said that this prescription has a total of 180 pills and that the pharmacist at [facility] told her that it is a 45 day supply and that she is not going to be able to refill a new one until after 45 days. Pt concern and wants to know if she is able to take the norco every 4 - 6 hours as before and if she is going to be able to get another prescription when she finishes this prescription. (on [employer] the norco 10-'325mg'$  is for Q 4-6 hrs prn).  Please advise.

## 2014-10-18 NOTE — Telephone Encounter (Signed)
Advised pt as directed below, pt verbalized fully understanding.  Thanks,

## 2014-11-08 ENCOUNTER — Telehealth: Payer: Self-pay | Admitting: Family Medicine

## 2014-11-08 NOTE — Telephone Encounter (Signed)
Pt called for refill HYDROcodone-acetaminophen (NORCO) 10-325 MG per tablet 10/18/14 -- Birdie Sons, MD Every 4-6 hours as needed.  Will run out on the 16th.  Thanks, [employer]

## 2014-11-09 NOTE — Telephone Encounter (Signed)
Not due until 11-17-2014

## 2014-11-10 NOTE — Telephone Encounter (Signed)
Pt called to see if RX was ready. When I advised pt that Dr. Caryn Section said it wasn't due until 11/17/14 pt stated that she picked up the last RX on 10/14/14 and had it filled the same day. I looked up front at the RX sign in/out sheet and it was written and pick up on 10/14/14. On 11/17/14 the RX was changed because the one on 10/14/14 was written to take every 6 hours and then changed on 10/18/14 to be taken every 4 to 6 weeks. Please advise if the RX is due on 11/13/14 or 11/17/14. Pt stated she doesn't haven enough to last until 11/17/14. I didn't see where pt had picked up an RX for 10/18/14. Thanks TNP

## 2014-11-10 NOTE — Telephone Encounter (Signed)
See below and advise,  Thanks,

## 2014-11-10 NOTE — Telephone Encounter (Signed)
Pt called to see if RX is ready. Pt would like to get it tomorrow b/c she will be in the area and she wants to get it picked up before we close tomorrow so she can get it filled over the weekend. Thanks TNP.

## 2014-11-11 ENCOUNTER — Other Ambulatory Visit: Payer: Self-pay | Admitting: Family Medicine

## 2014-11-11 DIAGNOSIS — M5126 Other intervertebral disc displacement, lumbar region: Secondary | ICD-10-CM | POA: Diagnosis not present

## 2014-11-11 DIAGNOSIS — M545 Low back pain: Secondary | ICD-10-CM | POA: Diagnosis not present

## 2014-11-11 DIAGNOSIS — M5416 Radiculopathy, lumbar region: Secondary | ICD-10-CM | POA: Diagnosis not present

## 2014-11-11 MED ORDER — HYDROCODONE-ACETAMINOPHEN 10-325 MG PO TABS: ORAL_TABLET | ORAL | Status: DC

## 2014-11-14 ENCOUNTER — Encounter: Payer: Self-pay | Admitting: Family Medicine

## 2014-11-14 ENCOUNTER — Ambulatory Visit (INDEPENDENT_AMBULATORY_CARE_PROVIDER_SITE_OTHER): Payer: Medicare Other | Admitting: Family Medicine

## 2014-11-14 VITALS — BP 104/74 | HR 99 | Temp 98.5°F | Resp 20 | Wt 173.0 lb

## 2014-11-14 DIAGNOSIS — L239 Allergic contact dermatitis, unspecified cause: Secondary | ICD-10-CM

## 2014-11-14 DIAGNOSIS — L200 Besnier's prurigo: Secondary | ICD-10-CM

## 2014-11-14 MED ORDER — PREDNISONE 20 MG PO TABS: 20.0000 mg | ORAL_TABLET | Freq: Two times a day (BID) | ORAL | Status: AC

## 2014-11-14 NOTE — Progress Notes (Signed)
Patient: Yvette Riley Female    DOB: [birth year]   59 y.o.   MRN: [id number] Visit Date: 11/14/2014  Today's Provider: Lelon Huh, MD   Chief Complaint  Patient presents with  . Pruritis    x 1.5 weeks   Subjective:    HPI  Itching: Patient comes in today stating for the past 10 days, she has had itching all over her body. Itching first started on her back and has slowly spread to her arms, legs, back, abdomen and face. Patient states she sometimes gets small goose bumps in the itchy areas. Patient reports that she has chills when the goose bumps appear. Patient denies any pain. Patient has tried using Hydrocortisone cream, hot/ cool showers, oatmeal soap and lotions with no relief.     Allergies  Allergen Reactions  . Albuterol   . Codeine   . Crestor [Rosuvastatin]   . Gemfibrozil   . Lipitor [Atorvastatin]   . Penicillins   . Salmeterol Xinafoate   . Yellow Jacket Venom [Bee Venom]    Previous Medications   ASPIRIN EC 81 MG EC TABLET    Take 1 tablet (81 mg total) by mouth daily.   BECLOMETHASONE (QVAR) 40 MCG/ACT INHALER    Inhale 1 puff into the lungs 2 (two) times daily.   DICLOFENAC (FLECTOR) 1.3 % PTCH    Place 1 patch onto the skin as needed.   GLUCOSE BLOOD (BAYER CONTOUR TEST) TEST STRIP    1 each by Other route as needed for other. Use as instructed   HYDROCHLOROTHIAZIDE (HYDRODIURIL) 25 MG TABLET    Take 25 mg by mouth daily.   HYDROCODONE-ACETAMINOPHEN (NORCO) 10-325 MG TABLET    Every 4-6 hours as needed.   MELOXICAM (MOBIC) 15 MG TABLET    TAKE 1 TABLET BY MOUTH EVERY DAY   METFORMIN (GLUMETZA) 500 MG (MOD) 24 HR TABLET    Take 1 tablet (500 mg total) by mouth 2 (two) times daily with a meal.   METOPROLOL (LOPRESSOR) 100 MG TABLET       PANTOPRAZOLE (PROTONIX) 40 MG TABLET    TK 1 T PO ONCE D   TIZANIDINE (ZANAFLEX) 4 MG TABLET    Take 1 tablet (4 mg total) by mouth every 6 (six) hours as needed for muscle spasms.   XOPENEX HFA 45 MCG/ACT  INHALER    INHALE 1 PUFF BY MOUTH EVERY 4 HOURS AS NEEDED   ZOLPIDEM (AMBIEN) 10 MG TABLET    TAKE 1/2 TO 1 TABLET BY MOUTH EVERY NIGHT AT BEDTIME    Review of Systems  Constitutional: Negative for fever, chills, appetite change and fatigue.  Respiratory: Negative for chest tightness and shortness of breath.   Cardiovascular: Negative for chest pain and palpitations.  Gastrointestinal: Negative for nausea, vomiting and abdominal pain.  Skin:       Itchy skin  Neurological: Negative for dizziness and weakness.     Social History  Substance Use Topics  . Smoking status: Current Every Day Smoker -- 1.00 packs/day  . Smokeless tobacco: Not on file  . Alcohol Use: No   Objective:   BP 104/74 mmHg  Pulse 99  Temp(Src) 98.5 F (36.9 C) (Oral)  Resp 20  Wt 173 lb (78.472 kg)  SpO2 99%  Physical Exam  Derm: Scattered fine papular eruption covering upper chest and back with multiple excoriations. Lighter eruption over upper arms and abdomen. No facial, hand or leg lesions.  Assessment & Plan:     1. Allergic dermatitis Can take OTC Benadryl at night.  - predniSONE (DELTASONE) 20 MG tablet; Take 1 tablet (20 mg total) by mouth 2 (two) times daily with a meal.  Dispense: 20 tablet; Refill: 0   Call if symptoms change or if not rapidly improving.          Lelon Huh, MD  [facility]

## 2014-11-14 NOTE — Patient Instructions (Signed)
OTC Benadryl or generic equivalent at bedtime as needed.

## 2014-11-18 ENCOUNTER — Other Ambulatory Visit: Payer: Self-pay | Admitting: Family Medicine

## 2014-11-19 NOTE — Telephone Encounter (Signed)
Please call in zolpidem  

## 2014-11-21 ENCOUNTER — Other Ambulatory Visit: Payer: Self-pay | Admitting: Family Medicine

## 2014-11-21 NOTE — Telephone Encounter (Signed)
Pt contacted office for refill request on the following medications:  zolpidem (AMBIEN) 10 MG tablet.  [employer].  [phone]

## 2014-11-21 NOTE — Telephone Encounter (Signed)
Rx request has already been sent to provider.

## 2014-11-22 NOTE — Telephone Encounter (Signed)
Please call in zolpidem  

## 2014-11-22 NOTE — Telephone Encounter (Signed)
Pt called about this again, she has 3 tablets left and can not sleep with out this. Thank you-aa

## 2014-11-22 NOTE — Telephone Encounter (Signed)
Last ov was on 11/14/2014. Next ov appointment is on 12/13/14.  Thanks,

## 2014-11-22 NOTE — Telephone Encounter (Signed)
Rx called in to pharmacy. 

## 2014-11-28 ENCOUNTER — Other Ambulatory Visit: Payer: Self-pay | Admitting: Family Medicine

## 2014-12-07 ENCOUNTER — Other Ambulatory Visit: Payer: Self-pay | Admitting: Family Medicine

## 2014-12-07 NOTE — Telephone Encounter (Signed)
Pt contacted office for refill request on the following medications: HYDROcodone-acetaminophen (NORCO) 10-325 MG tablet. Pt would like to pick this up Friday so she can have it filled by Monday. Thanks TNP

## 2014-12-09 MED ORDER — HYDROCODONE-ACETAMINOPHEN 10-325 MG PO TABS: ORAL_TABLET | ORAL | Status: DC

## 2014-12-13 ENCOUNTER — Encounter: Payer: Self-pay | Admitting: Family Medicine

## 2014-12-13 ENCOUNTER — Ambulatory Visit (INDEPENDENT_AMBULATORY_CARE_PROVIDER_SITE_OTHER): Payer: Medicare Other | Admitting: Family Medicine

## 2014-12-13 VITALS — BP 110/68 | HR 93 | Temp 98.7°F | Resp 18 | Ht 59.5 in | Wt 174.0 lb

## 2014-12-13 DIAGNOSIS — Z23 Encounter for immunization: Secondary | ICD-10-CM

## 2014-12-13 DIAGNOSIS — M549 Dorsalgia, unspecified: Secondary | ICD-10-CM | POA: Diagnosis not present

## 2014-12-13 DIAGNOSIS — J4530 Mild persistent asthma, uncomplicated: Secondary | ICD-10-CM

## 2014-12-13 DIAGNOSIS — I10 Essential (primary) hypertension: Secondary | ICD-10-CM | POA: Diagnosis not present

## 2014-12-13 DIAGNOSIS — E785 Hyperlipidemia, unspecified: Secondary | ICD-10-CM | POA: Diagnosis not present

## 2014-12-13 MED ORDER — MELOXICAM 15 MG PO TABS: 15.0000 mg | ORAL_TABLET | Freq: Every day | ORAL | Status: DC

## 2014-12-13 MED ORDER — LEVALBUTEROL TARTRATE 45 MCG/ACT IN AERO
INHALATION_SPRAY | RESPIRATORY_TRACT | Status: DC
Start: 2014-12-13 — End: 2015-03-21

## 2014-12-13 MED ORDER — BECLOMETHASONE DIPROPIONATE 40 MCG/ACT IN AERS: 1.0000 | INHALATION_SPRAY | Freq: Two times a day (BID) | RESPIRATORY_TRACT | Status: DC

## 2014-12-13 MED ORDER — TIZANIDINE HCL 4 MG PO TABS: 4.0000 mg | ORAL_TABLET | Freq: Four times a day (QID) | ORAL | Status: DC | PRN

## 2014-12-13 NOTE — Progress Notes (Signed)
Patient: Yvette Riley Female    DOB: Apr 14, 1955   59 y.o.   MRN: [id number] Visit Date: 12/13/2014  Today's Provider: Lelon Huh, MD   Chief Complaint  Patient presents with  . Follow-up   Subjective:    HPI  Follow-up for pruritis from 11/14/2014; Can take OTC Benadryl at night.  - predniSONE (DELTASONE) 20 MG tablet; Take 1 tablet (20 mg total) by mouth 2 (two) times daily with a meal.  Rash has resolved since completed prednisone.   Asthma Is running out of Xopenex and Qvar which have been effective. Uses Xopenex HFA which she uses a few times per week. Doesn't tolerate albuterol due to racing heart and anxiousness  Hyperlipidemia Is taking Livalo a few times a week, but usually makes her nauseated and sometimes with vomiting.    Allergies  Allergen Reactions  . Albuterol   . Codeine   . Crestor [Rosuvastatin]   . Gemfibrozil   . Lipitor [Atorvastatin]   . Penicillins   . Salmeterol Xinafoate   . Yellow Jacket Venom [Bee Venom]    Previous Medications   ASPIRIN EC 81 MG EC TABLET    Take 1 tablet (81 mg total) by mouth daily.   BECLOMETHASONE (QVAR) 40 MCG/ACT INHALER    Inhale 1 puff into the lungs 2 (two) times daily.   DICLOFENAC (FLECTOR) 1.3 % PTCH    Place 1 patch onto the skin as needed.   GLUCOSE BLOOD (BAYER CONTOUR TEST) TEST STRIP    1 each by Other route as needed for other. Use as instructed   HYDROCHLOROTHIAZIDE (HYDRODIURIL) 25 MG TABLET    Take 25 mg by mouth daily.   HYDROCODONE-ACETAMINOPHEN (NORCO) 10-325 MG TABLET    Every 4-6 hours as needed.   MELOXICAM (MOBIC) 15 MG TABLET    TAKE 1 TABLET BY MOUTH EVERY DAY   METFORMIN (GLUMETZA) 500 MG (MOD) 24 HR TABLET    TAKE 1 TABLET BY MOUTH TWICE DAILY WITH A MEAL   METOPROLOL (LOPRESSOR) 100 MG TABLET       PANTOPRAZOLE (PROTONIX) 40 MG TABLET    TK 1 T PO ONCE D   TIZANIDINE (ZANAFLEX) 4 MG TABLET    Take 1 tablet (4 mg total) by mouth every 6 (six) hours as needed for muscle spasms.    XOPENEX HFA 45 MCG/ACT INHALER    INHALE 1 PUFF BY MOUTH EVERY 4 HOURS AS NEEDED   ZOLPIDEM (AMBIEN) 10 MG TABLET    TAKE 1/2 TO 1 TABLET BY MOUTH AT BEDTIME    Review of Systems  Constitutional: Negative for fever, chills, appetite change and fatigue.  Respiratory: Negative for chest tightness and shortness of breath.   Cardiovascular: Negative for chest pain and palpitations.  Gastrointestinal: Negative for nausea, vomiting and abdominal pain.  Neurological: Negative for dizziness and weakness.    Social History  Substance Use Topics  . Smoking status: Current Every Day Smoker -- 1.00 packs/day  . Smokeless tobacco: Not on file  . Alcohol Use: No   Objective:   BP 110/68 mmHg  Pulse 93  Temp(Src) 98.7 F (37.1 C) (Oral)  Resp 18  Ht 4' 11.5" (1.511 m)  Wt 174 lb (78.926 kg)  BMI 34.57 kg/m2  SpO2 92%  Physical Exam   General Appearance:    Alert, cooperative, no distress  Eyes:    PERRL, conjunctiva/corneas clear, EOM's intact       Lungs:     Clear  to auscultation bilaterally, respirations unlabored  Heart:    Regular rate and rhythm  Neurologic:   Awake, alert, oriented x 3. No apparent focal neurological           defect.           Assessment & Plan:     1. Back pain with radiation Fairly well controlled, needs refilles.  - tiZANidine (ZANAFLEX) 4 MG tablet; Take 1 tablet (4 mg total) by mouth every 6 (six) hours as needed for muscle spasms.  Dispense: 30 tablet; Refill: 5 - meloxicam (MOBIC) 15 MG tablet; Take 1 tablet (15 mg total) by mouth daily.  Dispense: 90 tablet; Refill: 1  2. Asthma, mild persistent, uncomplicated Well controlled with only occasional use of rescue inhaler. Needs refill.  - levalbuterol (XOPENEX HFA) 45 MCG/ACT inhaler; INHALE 1-2 PUFF BY MOUTH EVERY 4 HOURS AS NEEDED  Dispense: 15 g; Refill: 5 - beclomethasone (QVAR) 40 MCG/ACT inhaler; Inhale 1 puff into the lungs 2 (two) times daily.  Dispense: 1 Inhaler; Refill: 12  3.  Essential hypertension Well controlled.  Continue current medications.   - Renal function panel  4. Hyperlipidemia Doing fairly well on Livalo. Check labs.  - Lipid panel - Hepatic function panel  5. Need for influenza vaccination Fluarix.   Return for diabetes follow up in 3 months.         Lelon Huh, MD  [facility]

## 2014-12-30 ENCOUNTER — Other Ambulatory Visit: Payer: Self-pay | Admitting: Family Medicine

## 2014-12-30 NOTE — Telephone Encounter (Signed)
Pt called refill HYDROcodone-acetaminophen (NORCO) 10-325 MG tablet Taking 12/09/14 -- Birdie Sons, MD Every 4-6 hours as needed.  Please call when ready.  [phone]  Thanks Con Memos

## 2015-01-02 MED ORDER — HYDROCODONE-ACETAMINOPHEN 10-325 MG PO TABS: ORAL_TABLET | ORAL | Status: DC

## 2015-01-03 ENCOUNTER — Other Ambulatory Visit: Payer: Self-pay | Admitting: Family Medicine

## 2015-01-03 NOTE — Telephone Encounter (Signed)
Pt stated that her Metformin was switched and when she called to get the new RX which is metFORMIN (GLUMETZA) 500 MG (MOD) 24 hr tablet she was told it would cost her 247$ for a one month supply out of pocket. Pt would like to go back to the Metformin that she was taking so it wouldn't cost so much. Pharmacy [facility]. Please advise. Thanks TNP

## 2015-01-04 MED ORDER — METFORMIN HCL ER 500 MG PO TB24: 1000.0000 mg | ORAL_TABLET | Freq: Two times a day (BID) | ORAL | Status: DC

## 2015-01-04 NOTE — Telephone Encounter (Signed)
Have sent new rx for generic glucophage XR.

## 2015-01-04 NOTE — Telephone Encounter (Signed)
Patient was notified.

## 2015-01-27 ENCOUNTER — Other Ambulatory Visit: Payer: Self-pay | Admitting: Family Medicine

## 2015-01-27 MED ORDER — HYDROCODONE-ACETAMINOPHEN 10-325 MG PO TABS: ORAL_TABLET | ORAL | Status: DC

## 2015-01-27 NOTE — Telephone Encounter (Signed)
Prescription printed. Please notify patient it is ready for pick up. Thanks- Dr. Tynslee Bowlds.  

## 2015-01-27 NOTE — Telephone Encounter (Signed)
Last OV: 12/13/2014  Last Refill: 01/02/2015  [facility] patient

## 2015-01-27 NOTE — Telephone Encounter (Signed)
Patient advised RX is at front desk for pick up.

## 2015-01-27 NOTE — Telephone Encounter (Signed)
Pt contacted office for refill request on the following medications:  HYDROcodone-acetaminophen (NORCO) 10-325 MG tablet.  UV#[phone]/[id number]  This is Dr Caryn Section pt.  Pt states she will need this before the weekend if possible/MW

## 2015-02-09 ENCOUNTER — Other Ambulatory Visit: Payer: Self-pay | Admitting: Family Medicine

## 2015-02-10 ENCOUNTER — Encounter: Payer: Self-pay | Admitting: Family Medicine

## 2015-02-10 ENCOUNTER — Ambulatory Visit (INDEPENDENT_AMBULATORY_CARE_PROVIDER_SITE_OTHER): Payer: Medicare Other | Admitting: Family Medicine

## 2015-02-10 VITALS — BP 110/72 | HR 98 | Temp 98.6°F | Resp 20 | Wt 174.0 lb

## 2015-02-10 DIAGNOSIS — J40 Bronchitis, not specified as acute or chronic: Secondary | ICD-10-CM | POA: Diagnosis not present

## 2015-02-10 DIAGNOSIS — R509 Fever, unspecified: Secondary | ICD-10-CM

## 2015-02-10 DIAGNOSIS — J4530 Mild persistent asthma, uncomplicated: Secondary | ICD-10-CM | POA: Diagnosis not present

## 2015-02-10 LAB — POC INFLUENZA A&B (BINAX/QUICKVUE)
Influenza A, POC: NEGATIVE
Influenza B, POC: NEGATIVE

## 2015-02-10 MED ORDER — LEVOFLOXACIN 500 MG PO TABS: 500.0000 mg | ORAL_TABLET | Freq: Every day | ORAL | Status: DC

## 2015-02-10 MED ORDER — HYDROCODONE-ACETAMINOPHEN 10-325 MG PO TABS: ORAL_TABLET | ORAL | Status: DC

## 2015-02-10 MED ORDER — BECLOMETHASONE DIPROPIONATE 40 MCG/ACT IN AERS: 1.0000 | INHALATION_SPRAY | Freq: Two times a day (BID) | RESPIRATORY_TRACT | Status: DC

## 2015-02-10 NOTE — Progress Notes (Signed)
Patient: Yvette Riley Female    DOB: 1955-04-07   60 y.o.   MRN: [id number] Visit Date: 02/10/2015  Today's Provider: Lelon Huh, MD   Chief Complaint  Patient presents with  . URI   Subjective:    HPI  Cough:  Patient comes in stating she has had a productive cough with green phlegm for 2 days. Patient reports the cough is worsening. Patient has also had, low grade fever, chills, chest congestion, body aches, headache, fatigue, shortness of breath and wheezing. Patient thinks she may have a lung infection. Patient has tried using her inhalers to help with symptoms with no relief.       Allergies  Allergen Reactions  . Albuterol   . Codeine   . Crestor [Rosuvastatin]   . Gemfibrozil   . Lipitor [Atorvastatin]   . Penicillins   . Salmeterol Xinafoate   . Yellow Jacket Venom [Bee Venom]    Previous Medications   ASPIRIN EC 81 MG EC TABLET    Take 1 tablet (81 mg total) by mouth daily.   BECLOMETHASONE (QVAR) 40 MCG/ACT INHALER    Inhale 1 puff into the lungs 2 (two) times daily.   DICLOFENAC (FLECTOR) 1.3 % PTCH    Place 1 patch onto the skin as needed.   GLUCOSE BLOOD (BAYER CONTOUR TEST) TEST STRIP    1 each by Other route as needed for other. Use as instructed   HYDROCHLOROTHIAZIDE (HYDRODIURIL) 25 MG TABLET    Take 25 mg by mouth daily.   HYDROCODONE-ACETAMINOPHEN (NORCO) 10-325 MG TABLET    Every 4-6 hours as needed.   LEVALBUTEROL (XOPENEX HFA) 45 MCG/ACT INHALER    INHALE 1-2 PUFF BY MOUTH EVERY 4 HOURS AS NEEDED   MELOXICAM (MOBIC) 15 MG TABLET    Take 1 tablet (15 mg total) by mouth daily.   METFORMIN (GLUCOPHAGE-XR) 500 MG 24 HR TABLET    Take 2 tablets (1,000 mg total) by mouth 2 (two) times daily before a meal.   METOPROLOL (LOPRESSOR) 100 MG TABLET       PANTOPRAZOLE (PROTONIX) 40 MG TABLET    TK 1 T PO ONCE D   TIZANIDINE (ZANAFLEX) 4 MG TABLET    Take 1 tablet (4 mg total) by mouth every 6 (six) hours as needed for muscle spasms.   ZOLPIDEM  (AMBIEN) 10 MG TABLET    TAKE 1/2 TO 1 TABLET BY MOUTH AT BEDTIME    Review of Systems  Constitutional: Positive for fever (low grade; subjective), chills, diaphoresis and fatigue. Negative for appetite change.  HENT: Positive for congestion and postnasal drip. Negative for ear pain, mouth sores, nosebleeds, rhinorrhea, sinus pressure, sneezing and sore throat.   Respiratory: Positive for cough, shortness of breath and wheezing. Negative for chest tightness.   Cardiovascular: Negative for chest pain and palpitations.  Gastrointestinal: Negative for nausea, vomiting and abdominal pain.  Musculoskeletal: Positive for myalgias.  Neurological: Positive for headaches. Negative for dizziness, weakness and light-headedness.    Social History  Substance Use Topics  . Smoking status: Current Every Day Smoker -- 1.00 packs/day  . Smokeless tobacco: Not on file  . Alcohol Use: No   Objective:   BP 110/72 mmHg  Pulse 98  Temp(Src) 98.6 F (37 C) (Oral)  Resp 20  Wt 174 lb (78.926 kg)  SpO2 95%  Physical Exam  General Appearance:    Alert, cooperative, no distress  HENT:   ENT exam normal, no neck nodes  or sinus tenderness  Eyes:    PERRL, conjunctiva/corneas clear, EOM's intact       Lungs:     Diffuse expiratory wheezes and rhonchi, no rales, respirations unlabored  Heart:    Regular rate and rhythm  Neurologic:   Awake, alert, oriented x 3. No apparent focal neurological           defect.      Flu A -Negative Flu B- Negative     Assessment & Plan:     1. Bronchitis  - levofloxacin (LEVAQUIN) 500 MG tablet; Take 1 tablet (500 mg total) by mouth daily.  Dispense: 7 tablet; Refill: 0   Call if symptoms change or if not rapidly improving.     2. Asthma, mild persistent, uncomplicated Out of Qvar, sent refills.  - beclomethasone (QVAR) 40 MCG/ACT inhaler; Inhale 1 puff into the lungs 2 (two) times daily.  Dispense: 1 Inhaler; Refill: 12       Lelon Huh, MD  [facility]

## 2015-02-15 NOTE — Telephone Encounter (Signed)
Pt needs a refill of zolpidem (AMBIEN) 10 MG tablet she has 3 left.  Pt uses [facility] in [place]

## 2015-02-16 ENCOUNTER — Telehealth: Payer: Self-pay

## 2015-02-16 MED ORDER — ZOLPIDEM TARTRATE 10 MG PO TABS: 5.0000 mg | ORAL_TABLET | Freq: Every day | ORAL | Status: DC

## 2015-02-16 NOTE — Telephone Encounter (Signed)
FYI  Patient wanted to let you know that her insurance will not pay for Qvar inhaler and she can not afford to pay out of pocket for it. She wants to stay on Xopenex at this time.-aa

## 2015-02-16 NOTE — Telephone Encounter (Signed)
Pt called to get an update on the refill for zolpidem (AMBIEN) 10 MG tablet. Pt would like it called into [facility]. Thanks TNP

## 2015-02-16 NOTE — Telephone Encounter (Signed)
RX called in-aa

## 2015-02-16 NOTE — Telephone Encounter (Signed)
Please call in zolpidem  

## 2015-02-17 MED ORDER — FLUTICASONE PROPIONATE HFA 110 MCG/ACT IN AERO: 2.0000 | INHALATION_SPRAY | Freq: Two times a day (BID) | RESPIRATORY_TRACT | Status: DC

## 2015-02-17 NOTE — Telephone Encounter (Signed)
Patient advised as directed below. 

## 2015-02-17 NOTE — Telephone Encounter (Signed)
Have sent in new rx for Flovent to replace Qvar. We called in 5 refills of Xopenex in October.

## 2015-03-15 ENCOUNTER — Encounter: Payer: Self-pay | Admitting: Family Medicine

## 2015-03-15 ENCOUNTER — Ambulatory Visit (INDEPENDENT_AMBULATORY_CARE_PROVIDER_SITE_OTHER): Payer: Medicare Other | Admitting: Family Medicine

## 2015-03-15 VITALS — BP 106/70 | HR 84 | Temp 98.2°F | Resp 16 | Wt 173.0 lb

## 2015-03-15 DIAGNOSIS — G4700 Insomnia, unspecified: Secondary | ICD-10-CM | POA: Diagnosis not present

## 2015-03-15 DIAGNOSIS — J449 Chronic obstructive pulmonary disease, unspecified: Secondary | ICD-10-CM | POA: Diagnosis not present

## 2015-03-15 DIAGNOSIS — E119 Type 2 diabetes mellitus without complications: Secondary | ICD-10-CM

## 2015-03-15 DIAGNOSIS — M5126 Other intervertebral disc displacement, lumbar region: Secondary | ICD-10-CM | POA: Diagnosis not present

## 2015-03-15 DIAGNOSIS — E781 Pure hyperglyceridemia: Secondary | ICD-10-CM

## 2015-03-15 DIAGNOSIS — S40021A Contusion of right upper arm, initial encounter: Secondary | ICD-10-CM | POA: Diagnosis not present

## 2015-03-15 DIAGNOSIS — I10 Essential (primary) hypertension: Secondary | ICD-10-CM

## 2015-03-15 LAB — POCT GLYCOSYLATED HEMOGLOBIN (HGB A1C)
Est. average glucose Bld gHb Est-mCnc: 140
HEMOGLOBIN A1C: 6.5

## 2015-03-15 NOTE — Patient Instructions (Addendum)
   Apply ice to injured arm every 3-4 hours for the next 3 days  If arm is not feeling much better by Monday then call for Xray orders
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
## 2015-03-21 ENCOUNTER — Inpatient Hospital Stay
Admission: EM | Admit: 2015-03-21 | Discharge: 2015-03-23 | DRG: 871 | Disposition: A | Discharge status: Home / Self Care | Payer: Medicare Other | Attending: Internal Medicine | Admitting: Internal Medicine

## 2015-03-21 ENCOUNTER — Emergency Department: Payer: Medicare Other

## 2015-03-21 DIAGNOSIS — E119 Type 2 diabetes mellitus without complications: Secondary | ICD-10-CM | POA: Diagnosis present

## 2015-03-21 DIAGNOSIS — I10 Essential (primary) hypertension: Secondary | ICD-10-CM | POA: Diagnosis not present

## 2015-03-21 DIAGNOSIS — Z886 Allergy status to analgesic agent status: Secondary | ICD-10-CM

## 2015-03-21 DIAGNOSIS — J40 Bronchitis, not specified as acute or chronic: Secondary | ICD-10-CM

## 2015-03-21 DIAGNOSIS — Z8249 Family history of ischemic heart disease and other diseases of the circulatory system: Secondary | ICD-10-CM

## 2015-03-21 DIAGNOSIS — Z7982 Long term (current) use of aspirin: Secondary | ICD-10-CM

## 2015-03-21 DIAGNOSIS — M545 Low back pain: Secondary | ICD-10-CM | POA: Diagnosis present

## 2015-03-21 DIAGNOSIS — Z9889 Other specified postprocedural states: Secondary | ICD-10-CM

## 2015-03-21 DIAGNOSIS — J1000 Influenza due to other identified influenza virus with unspecified type of pneumonia: Secondary | ICD-10-CM | POA: Diagnosis present

## 2015-03-21 DIAGNOSIS — Z825 Family history of asthma and other chronic lower respiratory diseases: Secondary | ICD-10-CM

## 2015-03-21 DIAGNOSIS — E872 Acidosis: Secondary | ICD-10-CM | POA: Diagnosis present

## 2015-03-21 DIAGNOSIS — R0602 Shortness of breath: Secondary | ICD-10-CM | POA: Diagnosis not present

## 2015-03-21 DIAGNOSIS — Z79899 Other long term (current) drug therapy: Secondary | ICD-10-CM

## 2015-03-21 DIAGNOSIS — J189 Pneumonia, unspecified organism: Secondary | ICD-10-CM | POA: Diagnosis not present

## 2015-03-21 DIAGNOSIS — J101 Influenza due to other identified influenza virus with other respiratory manifestations: Secondary | ICD-10-CM | POA: Diagnosis present

## 2015-03-21 DIAGNOSIS — R05 Cough: Secondary | ICD-10-CM | POA: Diagnosis not present

## 2015-03-21 DIAGNOSIS — A419 Sepsis, unspecified organism: Secondary | ICD-10-CM | POA: Diagnosis not present

## 2015-03-21 DIAGNOSIS — F17210 Nicotine dependence, cigarettes, uncomplicated: Secondary | ICD-10-CM | POA: Diagnosis present

## 2015-03-21 DIAGNOSIS — G8929 Other chronic pain: Secondary | ICD-10-CM | POA: Diagnosis present

## 2015-03-21 DIAGNOSIS — R0682 Tachypnea, not elsewhere classified: Secondary | ICD-10-CM | POA: Diagnosis present

## 2015-03-21 DIAGNOSIS — Z9851 Tubal ligation status: Secondary | ICD-10-CM

## 2015-03-21 DIAGNOSIS — Z9981 Dependence on supplemental oxygen: Secondary | ICD-10-CM | POA: Diagnosis not present

## 2015-03-21 DIAGNOSIS — J441 Chronic obstructive pulmonary disease with (acute) exacerbation: Secondary | ICD-10-CM | POA: Diagnosis not present

## 2015-03-21 DIAGNOSIS — J440 Chronic obstructive pulmonary disease with acute lower respiratory infection: Secondary | ICD-10-CM | POA: Diagnosis present

## 2015-03-21 DIAGNOSIS — Z823 Family history of stroke: Secondary | ICD-10-CM | POA: Diagnosis not present

## 2015-03-21 DIAGNOSIS — M549 Dorsalgia, unspecified: Secondary | ICD-10-CM | POA: Diagnosis present

## 2015-03-21 DIAGNOSIS — Z880 Allergy status to penicillin: Secondary | ICD-10-CM

## 2015-03-21 DIAGNOSIS — Z803 Family history of malignant neoplasm of breast: Secondary | ICD-10-CM

## 2015-03-21 DIAGNOSIS — R059 Cough, unspecified: Secondary | ICD-10-CM

## 2015-03-21 HISTORY — DX: Pneumonia, unspecified organism: J18.9

## 2015-03-21 LAB — CBC WITH DIFFERENTIAL/PLATELET
BASOS PCT: 0 %
Basophils Absolute: 0 10*3/uL (ref 0–0.1)
EOS ABS: 0.2 10*3/uL (ref 0–0.7)
EOS PCT: 2 %
HCT: 40.2 % (ref 35.0–47.0)
Hemoglobin: 13.7 g/dL (ref 12.0–16.0)
LYMPHS ABS: 0.9 10*3/uL — AB (ref 1.0–3.6)
Lymphocytes Relative: 9 %
MCH: 36.6 pg — AB (ref 26.0–34.0)
MCHC: 34 g/dL (ref 32.0–36.0)
MCV: 107.5 fL — ABNORMAL HIGH (ref 80.0–100.0)
Monocytes Absolute: 0.4 10*3/uL (ref 0.2–0.9)
Monocytes Relative: 4 %
Neutro Abs: 8.4 10*3/uL — ABNORMAL HIGH (ref 1.4–6.5)
Neutrophils Relative %: 85 %
PLATELETS: 316 10*3/uL (ref 150–440)
RBC: 3.74 MIL/uL — AB (ref 3.80–5.20)
RDW: 16.4 % — ABNORMAL HIGH (ref 11.5–14.5)
WBC: 9.9 10*3/uL (ref 3.6–11.0)

## 2015-03-21 LAB — URINALYSIS COMPLETE WITH MICROSCOPIC (ARMC ONLY)
BILIRUBIN URINE: NEGATIVE
Bacteria, UA: NONE SEEN
GLUCOSE, UA: NEGATIVE mg/dL
HGB URINE DIPSTICK: NEGATIVE
KETONES UR: NEGATIVE mg/dL
LEUKOCYTES UA: NEGATIVE
NITRITE: NEGATIVE
Protein, ur: 30 mg/dL — AB
SPECIFIC GRAVITY, URINE: 1.013 (ref 1.005–1.030)
SQUAMOUS EPITHELIAL / LPF: NONE SEEN
pH: 7 (ref 5.0–8.0)

## 2015-03-21 LAB — COMPREHENSIVE METABOLIC PANEL
ALBUMIN: 4.4 g/dL (ref 3.5–5.0)
ALT: 17 U/L (ref 14–54)
ANION GAP: 8 (ref 5–15)
AST: 28 U/L (ref 15–41)
Alkaline Phosphatase: 80 U/L (ref 38–126)
BUN: 9 mg/dL (ref 6–20)
CHLORIDE: 101 mmol/L (ref 101–111)
CO2: 26 mmol/L (ref 22–32)
CREATININE: 0.51 mg/dL (ref 0.44–1.00)
Calcium: 9.1 mg/dL (ref 8.9–10.3)
GFR calc non Af Amer: >60 mL/min (ref >60)
Glucose, Bld: 111 mg/dL — ABNORMAL HIGH (ref 65–99)
Potassium: 4.3 mmol/L (ref 3.5–5.1)
SODIUM: 135 mmol/L (ref 135–145)
Total Bilirubin: 1.2 mg/dL (ref 0.3–1.2)
Total Protein: 7.9 g/dL (ref 6.5–8.1)

## 2015-03-21 LAB — TROPONIN I: Troponin I: 0.04 ng/mL — ABNORMAL HIGH (ref <0.031)

## 2015-03-21 LAB — LACTIC ACID, PLASMA: LACTIC ACID, VENOUS: 2.1 mmol/L — AB (ref 0.5–2.0)

## 2015-03-21 MED ORDER — METHYLPREDNISOLONE SODIUM SUCC 125 MG IJ SOLR
125.0000 mg | Freq: Once | INTRAMUSCULAR | Status: AC
  Administered 2015-03-21: 125 mg via INTRAVENOUS
  Filled 2015-03-21: qty 2

## 2015-03-21 MED ORDER — ASPIRIN EC 81 MG PO TBEC
81.0000 mg | DELAYED_RELEASE_TABLET | Freq: Every day | ORAL | Status: DC
  Administered 2015-03-22: 81 mg via ORAL
  Filled 2015-03-21: qty 1

## 2015-03-21 MED ORDER — METHYLPREDNISOLONE SODIUM SUCC 125 MG IJ SOLR
60.0000 mg | Freq: Three times a day (TID) | INTRAMUSCULAR | Status: DC
  Administered 2015-03-22: 60 mg via INTRAVENOUS
  Administered 2015-03-22: 125 mg via INTRAVENOUS
  Administered 2015-03-22 – 2015-03-23 (×2): 60 mg via INTRAVENOUS
  Filled 2015-03-21 (×4): qty 2

## 2015-03-21 MED ORDER — HYDROCODONE-ACETAMINOPHEN 5-325 MG PO TABS
2.0000 | ORAL_TABLET | Freq: Once | ORAL | Status: AC
  Administered 2015-03-21: 2 via ORAL
  Filled 2015-03-21: qty 2

## 2015-03-21 MED ORDER — DEXTROSE 5 % IV SOLN
2.0000 g | Freq: Once | INTRAVENOUS | Status: AC
  Administered 2015-03-21: 2 g via INTRAVENOUS
  Filled 2015-03-21: qty 2

## 2015-03-21 MED ORDER — SODIUM CHLORIDE 0.9 % IV BOLUS (SEPSIS)
1000.0000 mL | INTRAVENOUS | Status: AC
  Administered 2015-03-21 (×2): 1000 mL via INTRAVENOUS

## 2015-03-21 MED ORDER — SODIUM CHLORIDE 0.9 % IV BOLUS (SEPSIS)
500.0000 mL | INTRAVENOUS | Status: AC
  Administered 2015-03-21: 500 mL via INTRAVENOUS

## 2015-03-21 MED ORDER — VANCOMYCIN HCL IN DEXTROSE 750-5 MG/150ML-% IV SOLN
750.0000 mg | Freq: Two times a day (BID) | INTRAVENOUS | Status: DC
  Administered 2015-03-22 – 2015-03-23 (×3): 750 mg via INTRAVENOUS
  Filled 2015-03-21 (×4): qty 150

## 2015-03-21 MED ORDER — DEXTROSE 5 % IV SOLN
2.0000 g | Freq: Three times a day (TID) | INTRAVENOUS | Status: DC
  Administered 2015-03-22 – 2015-03-23 (×4): 2 g via INTRAVENOUS
  Filled 2015-03-21 (×8): qty 2

## 2015-03-21 MED ORDER — SODIUM CHLORIDE 0.9 % IV SOLN
INTRAVENOUS | Status: DC
  Administered 2015-03-22: 01:00:00 via INTRAVENOUS

## 2015-03-21 MED ORDER — ENOXAPARIN SODIUM 40 MG/0.4ML ~~LOC~~ SOLN
40.0000 mg | SUBCUTANEOUS | Status: DC
  Administered 2015-03-22: 40 mg via SUBCUTANEOUS
  Filled 2015-03-21: qty 0.4

## 2015-03-21 MED ORDER — IPRATROPIUM-ALBUTEROL 0.5-2.5 (3) MG/3ML IN SOLN
3.0000 mL | Freq: Once | RESPIRATORY_TRACT | Status: AC
  Administered 2015-03-21: 3 mL via RESPIRATORY_TRACT
  Filled 2015-03-21: qty 3

## 2015-03-21 MED ORDER — ONDANSETRON HCL 4 MG/2ML IJ SOLN: 4.0000 mg | Freq: Four times a day (QID) | INTRAMUSCULAR | Status: DC | PRN

## 2015-03-21 MED ORDER — MOMETASONE FURO-FORMOTEROL FUM 100-5 MCG/ACT IN AERO
2.0000 | INHALATION_SPRAY | Freq: Two times a day (BID) | RESPIRATORY_TRACT | Status: DC
  Administered 2015-03-22 – 2015-03-23 (×3): 2 via RESPIRATORY_TRACT
  Filled 2015-03-21 (×2): qty 8.8

## 2015-03-21 MED ORDER — ONDANSETRON HCL 4 MG PO TABS: 4.0000 mg | ORAL_TABLET | Freq: Four times a day (QID) | ORAL | Status: DC | PRN

## 2015-03-21 MED ORDER — SODIUM CHLORIDE 0.9% FLUSH
3.0000 mL | Freq: Two times a day (BID) | INTRAVENOUS | Status: DC
  Administered 2015-03-22 – 2015-03-23 (×4): 3 mL via INTRAVENOUS

## 2015-03-21 MED ORDER — ZOLPIDEM TARTRATE 5 MG PO TABS
10.0000 mg | ORAL_TABLET | Freq: Every day | ORAL | Status: DC
  Administered 2015-03-22 (×2): 10 mg via ORAL
  Filled 2015-03-21 (×2): qty 2

## 2015-03-21 MED ORDER — ACETAMINOPHEN 325 MG PO TABS: 650.0000 mg | ORAL_TABLET | Freq: Four times a day (QID) | ORAL | Status: DC | PRN

## 2015-03-21 MED ORDER — LEVALBUTEROL HCL 0.63 MG/3ML IN NEBU
0.6300 mg | INHALATION_SOLUTION | Freq: Four times a day (QID) | RESPIRATORY_TRACT | Status: DC
  Administered 2015-03-22 – 2015-03-23 (×6): 0.63 mg via RESPIRATORY_TRACT
  Filled 2015-03-21 (×7): qty 3

## 2015-03-21 MED ORDER — VANCOMYCIN HCL IN DEXTROSE 1-5 GM/200ML-% IV SOLN
1000.0000 mg | Freq: Once | INTRAVENOUS | Status: AC
  Administered 2015-03-21: 1000 mg via INTRAVENOUS
  Filled 2015-03-21: qty 200

## 2015-03-21 MED ORDER — ACETAMINOPHEN 650 MG RE SUPP: 650.0000 mg | Freq: Four times a day (QID) | RECTAL | Status: DC | PRN

## 2015-03-21 NOTE — ED Notes (Signed)
Pt alert and oriented. Meal given .

## 2015-03-21 NOTE — Progress Notes (Signed)
Pharmacy Antibiotic Note  Yvette Riley is a 60 y.o. female admitted on 03/21/2015 with pneumonia.  Pharmacy has been consulted for vancomycin and aztreonam dosing.  Plan: TBW 77.1kg  IBW 43.2kg  DW 57kg  Vd 40L kei 0.062 hr-1  T1/2 11 hours Vancomycin 750 mg q 12 hours ordered with stacked dosing. Level before 5th dose, Goal 15-20,  Aztreonam 2 grams q 8 hours ordered.  Height: '4\' 11"'$  (149.9 cm) Weight: 171 lb (77.565 kg) IBW/kg (Calculated) : 43.2  Temp (24hrs), Avg:100.3 F (37.9 C), Min:100.3 F (37.9 C), Max:100.3 F (37.9 C)   Recent Labs Lab 03/21/15 1938 03/21/15 2020  WBC 9.9  --   CREATININE 0.51  --   LATICACIDVEN  --  2.1*    Estimated Creatinine Clearance: 69 mL/min (by C-G formula based on Cr of 0.51).    Allergies  Allergen Reactions  . Codeine Anaphylaxis  . Crestor [Rosuvastatin] Anaphylaxis  . Yellow Jacket Venom [Bee Venom] Anaphylaxis  . Gemfibrozil Rash  . Lipitor [Atorvastatin] Rash  . Penicillins Swelling, Rash and Other (See Comments)    Reaction:  Tongue swelling  Has patient had a PCN reaction causing immediate rash, facial/tongue/throat swelling, SOB or lightheadedness with hypotension:  Yes   Has patient had a PCN reaction causing severe rash involving mucus membranes or skin necrosis: No Has patient had a PCN reaction that required hospitalization No Has patient had a PCN reaction occurring within the last 10 years: No If all of the above answers are "NO", then may proceed with Cephalosporin use.    Antimicrobials this admission:   Dose adjustments this admission:   Microbiology results: 2/21 BCx: pending 2/21 UCx: pending  2/21 Sputum: pending   MRSA PCR:   UA: (-) CXR: emphysema  Thank you for allowing pharmacy to be a part of this patient's care.  Shirline Kendle S 03/21/2015 11:37 PM

## 2015-03-21 NOTE — ED Notes (Signed)
Pt arrived to ED with c/o Shortness of breath that began at 3pm today. Pt c/o increased pain with taking deep breath.

## 2015-03-21 NOTE — H&P (Signed)
[place] [facility] [place] NAME: Yvette Riley    MR#:  [id number]  DATE OF BIRTH:  Jan 07, 1956  DATE OF ADMISSION:  03/21/2015  PRIMARY CARE PHYSICIAN: Lelon Huh, MD   REQUESTING/REFERRING PHYSICIAN: Clearnce Hasten, MD  CHIEF COMPLAINT:   Chief Complaint  Patient presents with  . Shortness of Breath    HISTORY OF PRESENT ILLNESS:  Yvette Riley  is a 60 y.o. female who presents with progressive of shortness of breath. Patient has a history of COPD, saw her outpatient physician about a week ago was started on Levaquin. Despite this, her respiratory symptoms have not improved. She came to the ED for evaluation today. She was noted to be audibly wheezing, has had increased sputum production, and was borderline febrile here with reported fevers at home. Chest x-ray did not show any overt pneumonia, but hospitalists were called for admission for COPD exacerbation with likelihood of sepsis given her tachycardia, tachypnea, fever, and mild lactic acidosis.  PAST MEDICAL HISTORY:   Past Medical History  Diagnosis Date  . Displacement of lumbar intervertebral disc without myelopathy   . History of chicken pox   . COPD (chronic obstructive pulmonary disease) ([place])   . HTN (hypertension)     PAST SURGICAL HISTORY:   Past Surgical History  Procedure Laterality Date  . Cesarean section    . Tubal ligation      SOCIAL HISTORY:   Social History  Substance Use Topics  . Smoking status: Current Every Day Smoker -- 1.00 packs/day  . Smokeless tobacco: Not on file  . Alcohol Use: No    FAMILY HISTORY:   Family History  Problem Relation Age of Onset  . Heart failure Mother   . Heart disease Mother   . Stroke Mother   . Heart disease Brother   . COPD Brother   . Cancer Maternal Aunt     Breast Cancer    DRUG ALLERGIES:   Allergies  Allergen Reactions  . Codeine Anaphylaxis  . Crestor [Rosuvastatin] Anaphylaxis  . Yellow  Jacket Venom [Bee Venom] Anaphylaxis  . Gemfibrozil Rash  . Lipitor [Atorvastatin] Rash  . Penicillins Swelling, Rash and Other (See Comments)    Reaction:  Tongue swelling  Has patient had a PCN reaction causing immediate rash, facial/tongue/throat swelling, SOB or lightheadedness with hypotension:  Yes   Has patient had a PCN reaction causing severe rash involving mucus membranes or skin necrosis: No Has patient had a PCN reaction that required hospitalization No Has patient had a PCN reaction occurring within the last 10 years: No If all of the above answers are "NO", then may proceed with Cephalosporin use.    MEDICATIONS AT HOME:   Prior to Admission medications   Medication Sig Start Date End Date Taking? Authorizing Provider  aspirin EC 81 MG tablet Take 81 mg by mouth at bedtime.   Yes Historical Provider, MD  fluticasone (FLOVENT HFA) 110 MCG/ACT inhaler Inhale 2 puffs into the lungs 2 (two) times daily as needed (for shortness of breath/wheezing).   Yes Historical Provider, MD  HYDROcodone-acetaminophen (NORCO) 10-325 MG tablet Take 1 tablet by mouth every 4 (four) hours as needed for moderate pain.   Yes Historical Provider, MD  levalbuterol [facility] HFA) 45 MCG/ACT inhaler Inhale 1-2 puffs into the lungs every 4 (four) hours as needed for wheezing or shortness of breath.   Yes Historical Provider, MD  meloxicam (MOBIC) 15 MG tablet Take 15 mg by mouth daily  as needed for pain.   Yes Historical Provider, MD  metFORMIN (GLUCOPHAGE) 500 MG tablet Take 1,000 mg by mouth 2 (two) times daily with a meal.   Yes Historical Provider, MD  zolpidem (AMBIEN) 10 MG tablet Take 10 mg by mouth at bedtime.   Yes Historical Provider, MD  metFORMIN (GLUCOPHAGE-XR) 500 MG 24 hr tablet Take 2 tablets (1,000 mg total) by mouth 2 (two) times daily before a meal. Patient not taking: Reported on 03/21/2015 01/04/15   Birdie Sons, MD  tiZANidine (ZANAFLEX) 4 MG tablet Take 1 tablet (4 mg total) by  mouth every 6 (six) hours as needed for muscle spasms. Patient not taking: Reported on 03/21/2015 12/13/14   Birdie Sons, MD    REVIEW OF SYSTEMS:  Review of Systems  Constitutional: Positive for fever. Negative for chills, weight loss and malaise/fatigue.  HENT: Negative for ear pain, hearing loss and tinnitus.   Eyes: Negative for blurred vision, double vision, pain and redness.  Respiratory: Positive for cough, sputum production, shortness of breath and wheezing. Negative for hemoptysis.   Cardiovascular: Negative for chest pain, palpitations, orthopnea and leg swelling.  Gastrointestinal: Negative for nausea, vomiting, abdominal pain, diarrhea and constipation.  Genitourinary: Negative for dysuria, frequency and hematuria.  Musculoskeletal: Negative for back pain, joint pain and neck pain.  Skin:       No acne, rash, or lesions  Neurological: Negative for dizziness, tremors, focal weakness and weakness.  Endo/Heme/Allergies: Negative for polydipsia. Does not bruise/bleed easily.  Psychiatric/Behavioral: Negative for depression. The patient is not nervous/anxious and does not have insomnia.      VITAL SIGNS:   Filed Vitals:   03/21/15 2100 03/21/15 2130 03/21/15 2200 03/21/15 2230  BP: 160/93 159/83 164/82 157/90  Pulse: 110 117 120 125  Temp:      TempSrc:      Resp: '21 23 25 16  '$ Height:      Weight:      SpO2: 94% 92% 95% 93%   Wt Readings from Last 3 Encounters:  03/21/15 77.565 kg (171 lb)  03/15/15 78.472 kg (173 lb)  02/10/15 78.926 kg (174 lb)    PHYSICAL EXAMINATION:  Physical Exam  Vitals reviewed. Constitutional: She is oriented to person, place, and time. She appears well-developed and well-nourished. No distress.  HENT:  Head: Normocephalic and atraumatic.  Mouth/Throat: Oropharynx is clear and moist.  Eyes: Conjunctivae and EOM are normal. Pupils are equal, round, and reactive to light. No scleral icterus.  Neck: Normal range of motion. Neck supple.  No JVD present. No thyromegaly present.  Cardiovascular: Normal rate, regular rhythm and intact distal pulses.  Exam reveals no gallop and no friction rub.   No murmur heard. Respiratory: She is in respiratory distress (mild). She has wheezes. She has no rales.  Coarse breath sounds  GI: Soft. Bowel sounds are normal. She exhibits no distension. There is no tenderness.  Musculoskeletal: Normal range of motion. She exhibits no edema.  No arthritis, no gout  Lymphadenopathy:    She has no cervical adenopathy.  Neurological: She is alert and oriented to person, place, and time. No cranial nerve deficit.  No dysarthria, no aphasia  Skin: Skin is warm and dry. No rash noted. No erythema.  Psychiatric: She has a normal mood and affect. Her behavior is normal. Judgment and thought content normal.    LABORATORY PANEL:   CBC  Recent Labs Lab 03/21/15 1938  WBC 9.9  HGB 13.7  HCT 40.2  PLT 316   ------------------------------------------------------------------------------------------------------------------  Chemistries   Recent Labs Lab 03/21/15 1938  NA 135  K 4.3  CL 101  CO2 26  GLUCOSE 111*  BUN 9  CREATININE 0.51  CALCIUM 9.1  AST 28  ALT 17  ALKPHOS 80  BILITOT 1.2   ------------------------------------------------------------------------------------------------------------------  Cardiac Enzymes  Recent Labs Lab 03/21/15 1938  TROPONINI 0.04*   ------------------------------------------------------------------------------------------------------------------  RADIOLOGY:  Dg Chest 2 View  03/21/2015  CLINICAL DATA:  Shortness of breath of began at 3 p.m. today. EXAM: CHEST  2 VIEW COMPARISON:  06/12/2014 FINDINGS: Hyperexpansion is consistent with emphysema. The lungs are clear wiithout focal pneumonia, edema, pneumothorax or pleural effusion. Interstitial markings are diffusely coarsened with chronic features. Cardiopericardial silhouette is at upper limits  of normal for size. The visualized bony structures of the thorax are intact. IMPRESSION: Stable.  Emphysema without acute cardiopulmonary findings. Electronically Signed   By: Misty Stanley M.D.   On: 03/21/2015 20:11    EKG:   Orders placed or performed during the hospital encounter of 03/21/15  . EKG 12-Lead  . EKG 12-Lead  . ED EKG  . ED EKG  . ED EKG 12-Lead  . ED EKG 12-Lead  . EKG 12-Lead  . EKG 12-Lead    IMPRESSION AND PLAN:  Principal Problem:   Sepsis ([place]) - not hypertensive, lactic acid mildly elevated at 2.1 - continue to trend with fluid administration, IV antibiotics and culture sent from the ED, sputum culture ordered. Active Problems:   COPD exacerbation ([facility]) - IV steroids, duo nebs, antibiotics   CAP (community acquired pneumonia) - antibiotics and cultures as above.   Essential hypertension - continue home meds   Type 2 diabetes mellitus (H[facility]) - carb modified diet, sliding scale insulin with corresponding glucose checks   Back pain - when necessary pain meds at home dose.  All the records are reviewed and case discussed with ED provider. Management plans discussed with the patient and/or family.  DVT PROPHYLAXIS: SubQ lovenox  GI PROPHYLAXIS: None  ADMISSION STATUS: Inpatient  CODE STATUS: Full Code Status History    Date Active Date Inactive Code Status Order ID Comments User Context   06/12/2014  8:07 AM 06/12/2014  7:04 PM Full Code [id number]  Juluis Mire, MD Inpatient      TOTAL TIME TAKING CARE OF THIS PATIENT: 45 minutes.    Mylia Pondexter FIELDING 03/21/2015, 11:06 PM  [facility]  Office  [phone]  CC: Primary care physician; Lelon Huh, MD

## 2015-03-21 NOTE — ED Notes (Signed)
Lactic acid 2.1 and troponin 0.04. MD aware.

## 2015-03-21 NOTE — ED Provider Notes (Addendum)
[facility] Emergency Department Provider Note  ____________________________________________  Time seen: Approximately 921 PM  I have reviewed the triage vital signs and the nursing notes.   HISTORY  Chief Complaint Shortness of Breath    HPI Yvette Riley is a 60 y.o. female with a history of COPD on 2 L of home oxygen who is presenting today with worsening shortness of breath and fever over the past week. She says that one week ago she was seen at her primary care doctors and diagnosed with bronchitis. She was discharged on Levaquin. She says that the Levaquin has not been helping and that today she became acutely worse with fever and also pain with coughing and deep breathing. She is having a productive cough. Says has had multiple episodes of pneumonia. Last admission to the hospital was 8 months ago.   Past Medical History  Diagnosis Date  . Displacement of lumbar intervertebral disc without myelopathy   . History of chicken pox     Patient Active Problem List   Diagnosis Date Noted  . Allergic rhinitis 09/27/2014  . Asthma 09/27/2014  . Anemia 09/27/2014  . Hyperglyceridemia, pure 09/27/2014  . Glaucoma 09/27/2014  . Hip pain 09/27/2014  . Right knee pain 09/27/2014  . Displacement of lumbar intervertebral disc without myelopathy 09/27/2014  . Thoracic back pain 09/27/2014  . Insomnia 08/11/2014  . COPD (chronic obstructive pulmonary disease) ([place]) 06/12/2014  . DM (diabetes mellitus) ([place]) 06/12/2014  . Back pain 06/12/2014  . Essential hypertension 03/28/2014  . Tobacco use 03/28/2014    Past Surgical History  Procedure Laterality Date  . Cesarean section    . Tubal ligation      Current Outpatient Rx  Name  Route  Sig  Dispense  Refill  . aspirin EC 81 MG EC tablet   Oral   Take 1 tablet (81 mg total) by mouth daily.   30 tablet   0   . diclofenac (FLECTOR) 1.3 % PTCH   Transdermal   Place 1 patch onto the skin as  needed.         . fluticasone (FLOVENT HFA) 110 MCG/ACT inhaler   Inhalation   Inhale 2 puffs into the lungs 2 (two) times daily.   1 Inhaler   12   . glucose blood (BAYER CONTOUR TEST) test strip   Other   1 each by Other route as needed for other. Use as instructed   100 each   5   . HYDROcodone-acetaminophen (NORCO) 10-325 MG tablet      Every 4-6 hours as needed.   180 tablet   0     May fill on or after 02-26-2015   . levalbuterol (XOPENEX HFA) 45 MCG/ACT inhaler      INHALE 1-2 PUFF BY MOUTH EVERY 4 HOURS AS NEEDED   15 g   5   . meloxicam (MOBIC) 15 MG tablet   Oral   Take 1 tablet (15 mg total) by mouth daily.   90 tablet   1   . metFORMIN (GLUCOPHAGE-XR) 500 MG 24 hr tablet   Oral   Take 2 tablets (1,000 mg total) by mouth 2 (two) times daily before a meal.   360 tablet   4   . pantoprazole (PROTONIX) 40 MG tablet      TK 1 T PO ONCE D      3   . tiZANidine (ZANAFLEX) 4 MG tablet   Oral   Take 1 tablet (  4 mg total) by mouth every 6 (six) hours as needed for muscle spasms. Patient not taking: Reported on 03/15/2015   30 tablet   5   . zolpidem (AMBIEN) 10 MG tablet   Oral   Take 0.5-1 tablets (5-10 mg total) by mouth at bedtime.   90 tablet   1     Allergies Codeine; Crestor; Gemfibrozil; Lipitor; Penicillins; and Yellow jacket venom  Family History  Problem Relation Age of Onset  . Heart failure Mother   . Heart disease Mother   . Stroke Mother   . Heart disease Brother   . COPD Brother   . Cancer Maternal Aunt     Breast Cancer    Social History Social History  Substance Use Topics  . Smoking status: Current Every Day Smoker -- 1.00 packs/day  . Smokeless tobacco: None  . Alcohol Use: No    Review of Systems Constitutional: Fever Eyes: No visual changes. ENT: No sore throat. Cardiovascular: Chest pain to the mid sternum which is worsened with coughing and deep breathing. Respiratory: As above Gastrointestinal: No  abdominal pain.  No nausea, no vomiting.  No diarrhea.  No constipation. Genitourinary: Negative for dysuria. Musculoskeletal: Negative for back pain. Skin: Negative for rash. Neurological: Negative for headaches, focal weakness or numbness.  10-point ROS otherwise negative.  ____________________________________________   PHYSICAL EXAM:  VITAL SIGNS: ED Triage Vitals  Enc Vitals Group     BP 03/21/15 1934 138/74 mmHg     Pulse Rate 03/21/15 1934 115     Resp 03/21/15 1934 20     Temp 03/21/15 1934 100.3 F (37.9 C)     Temp Source 03/21/15 1934 Oral     SpO2 03/21/15 1934 91 %     Weight 03/21/15 1932 171 lb (77.565 kg)     Height 03/21/15 1932 '4\' 11"'$  (1.499 m)     Head Cir --      Peak Flow --      Pain Score 03/21/15 1933 3     Pain Loc --      Pain Edu? --      Excl. in [place]? --     Constitutional: Alert and oriented. Well appearing and in no acute distress. Eyes: Conjunctivae are normal. PERRL. EOMI. Head: Atraumatic. Nose: No congestion/rhinnorhea. Wearing nasal cannula. Mouth/Throat: Mucous membranes are moist.   Neck: No stridor.   Cardiovascular: Tachycardic, regular rhythm. Grossly normal heart sounds.  Good peripheral circulation. Respiratory: Normal respiratory effort.  No retractions. Rales to the right lower field. Prolonged expiration phase with expiratory cough. Gastrointestinal: Soft and nontender. No distention. No abdominal bruits. No CVA tenderness. Musculoskeletal: No lower extremity tenderness nor edema.  No joint effusions. Neurologic:  Normal speech and language. No gross focal neurologic deficits are appreciated.  Skin:  Skin is warm, dry and intact. No rash noted. Psychiatric: Mood and affect are normal. Speech and behavior are normal.  ____________________________________________   LABS (all labs ordered are listed, but only abnormal results are displayed)  Labs Reviewed  CULTURE, BLOOD (ROUTINE X 2)  CULTURE, BLOOD (ROUTINE X 2)  URINE  CULTURE  CBC WITH DIFFERENTIAL/PLATELET  COMPREHENSIVE METABOLIC PANEL  TROPONIN I  LACTIC ACID, PLASMA  LACTIC ACID, PLASMA  URINALYSIS COMPLETEWITH MICROSCOPIC ([facility] ONLY)   ____________________________________________  EKG  ED ECG REPORT I, Doran Stabler, the attending physician, personally viewed and interpreted this ECG.   Date: 03/21/2015  EKG Time: 1929  Rate: 19  Rhythm: sinus tachycardia  Axis:  Normal axis  Intervals:none  ST&T Change: No ST segment elevation or depression. No abnormal T-wave inversions.  ____________________________________________  RADIOLOGY  Stable chest x-ray. Emphysematous acute cardio pulmonary findings. ____________________________________________   PROCEDURES   ____________________________________________   INITIAL IMPRESSION / ASSESSMENT AND PLAN / ED COURSE  Pertinent labs & imaging results that were available during my care of the patient were reviewed by me and considered in my medical decision making (see chart for details).  Sepsis alert called.  ----------------------------------------- 8:52 PM on 03/21/2015 -----------------------------------------  To admit the patient. She is febrile after weeks with antibiotics. Suspect pneumonia initially but chest x-ray is been reassuring. Possible worsening of her bronchitis causing sepsis. Discussed the plan with the patient and she understands and is willing to comply. Signed out to Dr. Jannifer Franklin of the hospitalist service. Pending labs at this time. Dr. Jannifer Franklin to follow. ____________________________________________   FINAL CLINICAL IMPRESSION(S) / ED DIAGNOSES  Sepsis. Bronchitis.    Orbie Pyo, MD 03/21/15 2053  Symptoms ongoing for 1 week. Out of window for Tamiflu if this were to be flu.  Orbie Pyo, MD 03/21/15 [phone]

## 2015-03-21 NOTE — ED Notes (Signed)
Jeneen Rinks (son) [phone]

## 2015-03-22 ENCOUNTER — Telehealth: Payer: Self-pay | Admitting: Family Medicine

## 2015-03-22 ENCOUNTER — Encounter: Payer: Self-pay | Admitting: Internal Medicine

## 2015-03-22 LAB — TROPONIN I
Troponin I: <0.03 ng/mL (ref <0.031)
Troponin I: <0.03 ng/mL (ref <0.031)
Troponin I: 0.04 ng/mL — ABNORMAL HIGH (ref <0.031)

## 2015-03-22 LAB — CBC
HEMATOCRIT: 39.1 % (ref 35.0–47.0)
HEMOGLOBIN: 13.5 g/dL (ref 12.0–16.0)
MCH: 37.2 pg — AB (ref 26.0–34.0)
MCHC: 34.5 g/dL (ref 32.0–36.0)
MCV: 107.7 fL — ABNORMAL HIGH (ref 80.0–100.0)
Platelets: 309 10*3/uL (ref 150–440)
RBC: 3.63 MIL/uL — AB (ref 3.80–5.20)
RDW: 16.3 % — ABNORMAL HIGH (ref 11.5–14.5)
WBC: 8.1 10*3/uL (ref 3.6–11.0)

## 2015-03-22 LAB — BASIC METABOLIC PANEL
ANION GAP: 12 (ref 5–15)
BUN: 10 mg/dL (ref 6–20)
CHLORIDE: 108 mmol/L (ref 101–111)
CO2: 19 mmol/L — AB (ref 22–32)
Calcium: 8.9 mg/dL (ref 8.9–10.3)
Creatinine, Ser: 0.68 mg/dL (ref 0.44–1.00)
GFR calc non Af Amer: >60 mL/min (ref >60)
GLUCOSE: 225 mg/dL — AB (ref 65–99)
POTASSIUM: 3.7 mmol/L (ref 3.5–5.1)
Sodium: 139 mmol/L (ref 135–145)

## 2015-03-22 LAB — GLUCOSE, CAPILLARY
Glucose-Capillary: 277 mg/dL — ABNORMAL HIGH (ref 65–99)
Glucose-Capillary: 298 mg/dL — ABNORMAL HIGH (ref 65–99)

## 2015-03-22 LAB — INFLUENZA PANEL BY PCR (TYPE A & B)
H1N1 flu by pcr: NOT DETECTED
INFLAPCR: POSITIVE — AB
INFLBPCR: NEGATIVE

## 2015-03-22 LAB — LACTIC ACID, PLASMA: Lactic Acid, Venous: 4.3 mmol/L (ref 0.5–2.0)

## 2015-03-22 MED ORDER — INSULIN ASPART 100 UNIT/ML ~~LOC~~ SOLN
0.0000 [IU] | Freq: Every day | SUBCUTANEOUS | Status: DC
  Administered 2015-03-22: 3 [IU] via SUBCUTANEOUS
  Filled 2015-03-22: qty 3

## 2015-03-22 MED ORDER — INSULIN ASPART 100 UNIT/ML ~~LOC~~ SOLN
0.0000 [IU] | Freq: Three times a day (TID) | SUBCUTANEOUS | Status: DC
  Administered 2015-03-22: 5 [IU] via SUBCUTANEOUS
  Administered 2015-03-23: 7 [IU] via SUBCUTANEOUS
  Administered 2015-03-23: 5 [IU] via SUBCUTANEOUS
  Filled 2015-03-22 (×2): qty 5
  Filled 2015-03-22: qty 7

## 2015-03-22 MED ORDER — OSELTAMIVIR PHOSPHATE 75 MG PO CAPS
75.0000 mg | ORAL_CAPSULE | Freq: Two times a day (BID) | ORAL | Status: DC
  Administered 2015-03-22 – 2015-03-23 (×3): 75 mg via ORAL
  Filled 2015-03-22 (×4): qty 1

## 2015-03-22 MED ORDER — HYDROCODONE-ACETAMINOPHEN 10-325 MG PO TABS
1.0000 | ORAL_TABLET | ORAL | Status: DC | PRN
  Administered 2015-03-22 – 2015-03-23 (×5): 1 via ORAL
  Filled 2015-03-22 (×5): qty 1

## 2015-03-22 NOTE — Care Management (Signed)
Patient independent in all her adls at home and able to drive.  Denies issues accessing medical care or obtaining medications.  has chronic home 02 through [facility].

## 2015-03-22 NOTE — Progress Notes (Signed)
Initial Nutrition Assessment     INTERVENTION:  Meals and snacks: Cater to pt preferences    NUTRITION DIAGNOSIS:   Inadequate oral intake related to acute illness as evidenced by per patient/family report.    GOAL:   Patient will meet greater than or equal to 90% of their needs    MONITOR:    (Energy intake, )  REASON FOR ASSESSMENT:   Malnutrition Screening Tool    ASSESSMENT:      Pt admitted with sepsis, COPD exacerbation  Past Medical History  Diagnosis Date  . Displacement of lumbar intervertebral disc without myelopathy   . History of chicken pox   . COPD (chronic obstructive pulmonary disease) ([place])   . HTN (hypertension)     Current Nutrition: eating 50% or more of meals per pt  Food/Nutrition-Related History: Pt reports for the past few weeks have not been eating as much secondary to not feeling well   Scheduled Medications:  . aspirin EC  81 mg Oral QHS  . aztreonam  2 g Intravenous 3 times per day  . enoxaparin (LOVENOX) injection  40 mg Subcutaneous Q24H  . levalbuterol  0.63 mg Nebulization Q6H  . methylPREDNISolone (SOLU-MEDROL) injection  60 mg Intravenous 3 times per day  . mometasone-formoterol  2 puff Inhalation BID  . oseltamivir  75 mg Oral BID  . sodium chloride flush  3 mL Intravenous Q12H  . vancomycin  750 mg Intravenous Q12H  . zolpidem  10 mg Oral QHS         Electrolyte/Renal Profile and Glucose Profile:   Recent Labs Lab 03/21/15 1938 03/22/15 0830  NA 135 139  K 4.3 3.7  CL 101 108  CO2 26 19*  BUN 9 10  CREATININE 0.51 0.68  CALCIUM 9.1 8.9  GLUCOSE 111* 225*   Protein Profile:  Recent Labs Lab 03/21/15 1938  ALBUMIN 4.4    Gastrointestinal Profile: Last BM: 2/21   Nutrition-Focused Physical Exam Findings: Nutrition-Focused physical exam completed. Findings are WDL for fat depletion, muscle depletion, and edema.     Weight Change: 8% wt loss in the last year    Diet Order:  Diet heart  healthy/carb modified Room service appropriate?: Yes; Fluid consistency:: Thin  Skin:   reviewed   Height:   Ht Readings from Last 1 Encounters:  03/21/15 '4\' 11"'$  (1.499 m)    Weight:   Wt Readings from Last 1 Encounters:  03/21/15 176 lb 1.6 oz (79.878 kg)    Ideal Body Weight:     BMI:  Body mass index is 35.55 kg/(m^2).   EDUCATION NEEDS:   No education needs identified at this time  LOW Care Level  Anelle Parlow B. Zenia Resides, [place], [place] (pager) Weekend/On-Call pager [phone])

## 2015-03-22 NOTE — Progress Notes (Signed)
Patient admitted to room 215 with a diagnosis of sepsis. Alert and oriented x 4. Patient oriented to her room, staff, ascom/call bell. [facility] Neurosurgeon and password verified. Skin assessment done with Jeanette Caprice RN, noted MASD to the bilateral groin. Bed in lowest position. No c/o pain or any acute respiratory distress noted. Will continue to monitor.

## 2015-03-22 NOTE — Progress Notes (Signed)
MD paged regarding critical troponin of 0.04. MD acknowledged, no new orders.

## 2015-03-22 NOTE — Progress Notes (Signed)
Patient ID: Yvette Riley, female   DOB: 1956-01-27, 60 y.o.   MRN: [id number] [facility] PROGRESS NOTE  Yvette Riley:[id number] DOB: Oct 12, 1955 DOA: 03/21/2015 PCP: Lelon Huh, MD  HPI/Subjective: Patient feeling miserable. Weak and short of breath and cough.  Objective: Filed Vitals:   03/22/15 0505 03/22/15 1331  BP: 144/77 125/72  Pulse: 102 114  Temp: 97.5 F (36.4 C) 98.6 F (37 C)  Resp: 18 20    Filed Weights   03/21/15 1932 03/21/15 2356  Weight: 77.565 kg (171 lb) 79.878 kg (176 lb 1.6 oz)    ROS: Review of Systems  Constitutional: Negative for fever and chills.  Eyes: Negative for blurred vision.  Respiratory: Positive for cough and shortness of breath.   Cardiovascular: Negative for chest pain.  Gastrointestinal: Negative for nausea, vomiting, abdominal pain, diarrhea and constipation.  Genitourinary: Negative for dysuria.  Musculoskeletal: Negative for joint pain.  Neurological: Positive for weakness. Negative for dizziness and headaches.   Exam: Physical Exam  Constitutional: She is oriented to person, place, and time.  HENT:  Nose: No mucosal edema.  Mouth/Throat: No oropharyngeal exudate or posterior oropharyngeal edema.  Eyes: Conjunctivae, EOM and lids are normal. Pupils are equal, round, and reactive to light.  Neck: No JVD present. Carotid bruit is not present. No edema present. No thyroid mass and no thyromegaly present.  Cardiovascular: S1 normal and S2 normal.  Exam reveals no gallop.   No murmur heard. Pulses:      Dorsalis pedis pulses are 2+ on the right side, and 2+ on the left side.  Respiratory: No respiratory distress. She has wheezes in the right middle field, the right lower field, the left middle field and the left lower field. She has no rhonchi. She has no rales.  GI: Soft. Bowel sounds are normal. There is no tenderness.  Musculoskeletal:       Right ankle: She exhibits no swelling.       Left ankle: She  exhibits no swelling.  Lymphadenopathy:    She has no cervical adenopathy.  Neurological: She is alert and oriented to person, place, and time. No cranial nerve deficit.  Skin: Skin is warm. No rash noted. Nails show no clubbing.  Psychiatric: She has a normal mood and affect.      Data Reviewed: Basic Metabolic Panel:  Recent Labs Lab 03/21/15 1938 03/22/15 0830  NA 135 139  K 4.3 3.7  CL 101 108  CO2 26 19*  GLUCOSE 111* 225*  BUN 9 10  CREATININE 0.51 0.68  CALCIUM 9.1 8.9   Liver Function Tests:  Recent Labs Lab 03/21/15 1938  AST 28  ALT 17  ALKPHOS 80  BILITOT 1.2  PROT 7.9  ALBUMIN 4.4   CBC:  Recent Labs Lab 03/21/15 1938 03/22/15 0830  WBC 9.9 8.1  NEUTROABS 8.4*  --   HGB 13.7 13.5  HCT 40.2 39.1  MCV 107.5* 107.7*  PLT 316 309   Cardiac Enzymes:  Recent Labs Lab 03/21/15 1938 03/22/15 0042 03/22/15 0830 03/22/15 1433  TROPONINI 0.04* <0.03 <0.03 0.04*    Recent Results (from the past 240 hour(s))  Blood Culture (routine x 2)     Status: None (Preliminary result)   Collection Time: 03/21/15  7:59 PM  Result Value Ref Range Status   Specimen Description BLOOD LEFT ARM  Final   Special Requests BOTTLES DRAWN AEROBIC AND ANAEROBIC 7ML  Final   Culture NO GROWTH < 12 HOURS  Final  Report Status PENDING  Incomplete  Blood Culture (routine x 2)     Status: None (Preliminary result)   Collection Time: 03/21/15  8:04 PM  Result Value Ref Range Status   Specimen Description BLOOD RIGHT ARM  Final   Special Requests BOTTLES DRAWN AEROBIC AND ANAEROBIC 8ML  Final   Culture NO GROWTH < 12 HOURS  Final   Report Status PENDING  Incomplete  Urine culture     Status: None (Preliminary result)   Collection Time: 03/21/15  8:20 PM  Result Value Ref Range Status   Specimen Description URINE, RANDOM  Final   Special Requests NONE  Final   Culture NO GROWTH < 24 HOURS  Final   Report Status PENDING  Incomplete     Studies: Dg Chest 2  View  03/21/2015  CLINICAL DATA:  Shortness of breath of began at 3 p.m. today. EXAM: CHEST  2 VIEW COMPARISON:  06/12/2014 FINDINGS: Hyperexpansion is consistent with emphysema. The lungs are clear wiithout focal pneumonia, edema, pneumothorax or pleural effusion. Interstitial markings are diffusely coarsened with chronic features. Cardiopericardial silhouette is at upper limits of normal for size. The visualized bony structures of the thorax are intact. IMPRESSION: Stable.  Emphysema without acute cardiopulmonary findings. Electronically Signed   By: Misty Stanley M.D.   On: 03/21/2015 20:11    Scheduled Meds: . aspirin EC  81 mg Oral QHS  . aztreonam  2 g Intravenous 3 times per day  . enoxaparin (LOVENOX) injection  40 mg Subcutaneous Q24H  . insulin aspart  0-5 Units Subcutaneous QHS  . insulin aspart  0-9 Units Subcutaneous TID WC  . levalbuterol  0.63 mg Nebulization Q6H  . methylPREDNISolone (SOLU-MEDROL) injection  60 mg Intravenous 3 times per day  . mometasone-formoterol  2 puff Inhalation BID  . oseltamivir  75 mg Oral BID  . sodium chloride flush  3 mL Intravenous Q12H  . vancomycin  750 mg Intravenous Q12H  . zolpidem  10 mg Oral QHS    Assessment/Plan:  1. Clinical sepsis, influenza positive. Tamiflu started. Repeat chest x-ray tomorrow morning and if that is negative for pneumonia I will discontinue antibiotics 2. COPD exacerbation continue steroids and nebulizers treatments 3. Type 2 diabetes mellitus- I'll put on sliding scale while on steroids 4. Chronic low back pain. Medication  Code Status:     Code Status Orders        Start     Ordered   03/21/15 2357  Full code   Continuous     03/21/15 2356    Code Status History    Date Active Date Inactive Code Status Order ID Comments User Context   06/12/2014  8:07 AM 06/12/2014  7:04 PM Full Code [id number]  Yvette Mire, MD Inpatient     Disposition Plan: Home once breathing  better  Antibiotics: Tamiflu Aztreonam and vancomycin  Time spent: 25 minutes  Yvette Riley  [facility]

## 2015-03-22 NOTE — Telephone Encounter (Signed)
Pt wanted to let Dr. Caryn Section know that she was admitted to [facility] for the flu and pneumonia. Thanks TNP

## 2015-03-22 NOTE — Progress Notes (Signed)
Lactic 4.3 this morning, Dr. Estanislado Pandy notified but no new order, stated patient is on ABT already. He indicated the RN should report if the 3rd Lactic comes and the value is higher than the 2nd one. If the value is lower than the 2nd, then there's no need to report. Will continue to monitor.

## 2015-03-23 ENCOUNTER — Inpatient Hospital Stay: Payer: Medicare Other

## 2015-03-23 LAB — CBC
HCT: 38 % (ref 35.0–47.0)
HEMOGLOBIN: 12.9 g/dL (ref 12.0–16.0)
MCH: 36.8 pg — ABNORMAL HIGH (ref 26.0–34.0)
MCHC: 34 g/dL (ref 32.0–36.0)
MCV: 108.3 fL — ABNORMAL HIGH (ref 80.0–100.0)
PLATELETS: 309 10*3/uL (ref 150–440)
RBC: 3.51 MIL/uL — ABNORMAL LOW (ref 3.80–5.20)
RDW: 16.6 % — AB (ref 11.5–14.5)
WBC: 7.9 10*3/uL (ref 3.6–11.0)

## 2015-03-23 LAB — BASIC METABOLIC PANEL
Anion gap: 12 (ref 5–15)
BUN: 14 mg/dL (ref 6–20)
CALCIUM: 9.2 mg/dL (ref 8.9–10.3)
CO2: 22 mmol/L (ref 22–32)
CREATININE: 0.63 mg/dL (ref 0.44–1.00)
Chloride: 104 mmol/L (ref 101–111)
Glucose, Bld: 296 mg/dL — ABNORMAL HIGH (ref 65–99)
Potassium: 4.3 mmol/L (ref 3.5–5.1)
SODIUM: 138 mmol/L (ref 135–145)

## 2015-03-23 LAB — URINE CULTURE

## 2015-03-23 LAB — GLUCOSE, CAPILLARY
GLUCOSE-CAPILLARY: 285 mg/dL — AB (ref 65–99)
GLUCOSE-CAPILLARY: 312 mg/dL — AB (ref 65–99)

## 2015-03-23 MED ORDER — PREDNISONE 5 MG PO TABS: ORAL_TABLET | ORAL | Status: DC

## 2015-03-23 MED ORDER — OSELTAMIVIR PHOSPHATE 75 MG PO CAPS: 75.0000 mg | ORAL_CAPSULE | Freq: Two times a day (BID) | ORAL | Status: DC

## 2015-03-23 MED ORDER — PREDNISONE 20 MG PO TABS: 40.0000 mg | ORAL_TABLET | Freq: Every day | ORAL | Status: DC

## 2015-03-23 NOTE — Telephone Encounter (Signed)
[place] scheduled hospital f/u for 03/30/15 @ 10. Pt is being discharged today 03/23/15 and was treated for sepsis. Thanks TNP

## 2015-03-23 NOTE — Discharge Summary (Signed)
[facility] [place] NAME: Catrice Zuleta    MR#:  [id number]  DATE OF BIRTH:  October 19, 1955  DATE OF ADMISSION:  03/21/2015 ADMITTING PHYSICIAN: Lance Coon, MD  DATE OF DISCHARGE: 03/23/2015  1:09 PM  PRIMARY CARE PHYSICIAN: Lelon Huh, MD    ADMISSION DIAGNOSIS:  Bronchitis [J40] Sepsis, due to unspecified organism ([place]) [A41.9]  DISCHARGE DIAGNOSIS:  Principal Problem:   Sepsis ([place]) Active Problems:   Essential hypertension   Type 2 diabetes mellitus ([place])   Back pain   COPD exacerbation ([place])   CAP (community acquired pneumonia)   SECONDARY DIAGNOSIS:   Past Medical History  Diagnosis Date  . Displacement of lumbar intervertebral disc without myelopathy   . History of chicken pox   . COPD (chronic obstructive pulmonary disease) ([place])   . HTN (hypertension)     HOSPITAL COURSE:   1. Clinical sepsis with influenza A. Patient's blood culture and chest x-ray was negative. Antibiotics were stopped. Tamiflu started and prescribed for completion of course upon discharge. Patient states that she wanted to go home on 03/23/2015. She was still wheezing a little bit. But felt better than when she came in. This was not pneumonia. 2. COPD exacerbation- patient must stop smoking. Likely worsened with influenza A. Patient was given Solu-Medrol while here. Switch over to prednisone taper. She has her inhalers at home. 3. Essential hypertension continue current medications 4. Type 2 diabetes she was on sliding scale while here can go back to Glucophage at home. 5. Chronic back pain 6. Elevated troponin- secondary to sepsis no further workup  DISCHARGE CONDITIONS:   Satisfactory  CONSULTS OBTAINED:  None  DRUG ALLERGIES:   Allergies  Allergen Reactions  . Codeine Anaphylaxis  . Crestor [Rosuvastatin] Anaphylaxis  . Yellow Jacket Venom [Bee Venom] Anaphylaxis  . Gemfibrozil Rash  . Lipitor [Atorvastatin] Rash  .  Penicillins Swelling, Rash and Other (See Comments)    Reaction:  Tongue swelling  Has patient had a PCN reaction causing immediate rash, facial/tongue/throat swelling, SOB or lightheadedness with hypotension:  Yes   Has patient had a PCN reaction causing severe rash involving mucus membranes or skin necrosis: No Has patient had a PCN reaction that required hospitalization No Has patient had a PCN reaction occurring within the last 10 years: No If all of the above answers are "NO", then may proceed with Cephalosporin use.    DISCHARGE MEDICATIONS:   Discharge Medication List as of 03/23/2015 12:38 PM    START taking these medications   Details  oseltamivir (TAMIFLU) 75 MG capsule Take 1 capsule (75 mg total) by mouth 2 (two) times daily., Starting 03/23/2015, Until Discontinued, Print    predniSONE (DELTASONE) 5 MG tablet Take 5 tabs po day1; take 4 tabs day2; take 3 tabs day3; take 2 tabs day4 take on tab day5,6, Print      CONTINUE these medications which have NOT CHANGED   Details  aspirin EC 81 MG tablet Take 81 mg by mouth at bedtime., Until Discontinued, Historical Med    fluticasone (FLOVENT HFA) 110 MCG/ACT inhaler Inhale 2 puffs into the lungs 2 (two) times daily as needed (for shortness of breath/wheezing)., Until Discontinued, Historical Med    HYDROcodone-acetaminophen (NORCO) 10-325 MG tablet Take 1 tablet by mouth every 4 (four) hours as needed for moderate pain., Until Discontinued, Historical Med    levalbuterol (XOPENEX HFA) 45 MCG/ACT inhaler Inhale 1-2 puffs into the lungs every 4 (four) hours as  needed for wheezing or shortness of breath., Until Discontinued, Historical Med    meloxicam (MOBIC) 15 MG tablet Take 15 mg by mouth daily as needed for pain., Until Discontinued, Historical Med    zolpidem (AMBIEN) 10 MG tablet Take 10 mg by mouth at bedtime., Until Discontinued, Historical Med    metFORMIN (GLUCOPHAGE-XR) 500 MG 24 hr tablet Take 2 tablets (1,000 mg  total) by mouth 2 (two) times daily before a meal., Starting 01/04/2015, Until Discontinued, Normal    tiZANidine (ZANAFLEX) 4 MG tablet Take 1 tablet (4 mg total) by mouth every 6 (six) hours as needed for muscle spasms., Starting 12/13/2014, Until Discontinued, Normal      STOP taking these medications     metFORMIN (GLUCOPHAGE) 500 MG tablet          DISCHARGE INSTRUCTIONS:   Follow-up PMD  If you experience worsening of your admission symptoms, develop shortness of breath, life threatening emergency, suicidal or homicidal thoughts you must seek medical attention immediately by calling 911 or calling your MD immediately  if symptoms less severe.  You Must read complete instructions/literature along with all the possible adverse reactions/side effects for all the Medicines you take and that have been prescribed to you. Take any new Medicines after you have completely understood and accept all the possible adverse reactions/side effects.   Please note  You were cared for by a hospitalist during your hospital stay. If you have any questions about your discharge medications or the care you received while you were in the hospital after you are discharged, you can call the unit and asked to speak with the hospitalist on call if the hospitalist that took care of you is not available. Once you are discharged, your primary care physician will handle any further medical issues. Please note that NO REFILLS for any discharge medications will be authorized once you are discharged, as it is imperative that you return to your primary care physician (or establish a relationship with a primary care physician if you do not have one) for your aftercare needs so that they can reassess your need for medications and monitor your lab values.    Today   CHIEF COMPLAINT:   Chief Complaint  Patient presents with  . Shortness of Breath    HISTORY OF PRESENT ILLNESS:  Jodeen Mclin  is a 60 y.o. female  with a known history of COPD and tobacco abuse presents with shortness of breath.   VITAL SIGNS:  Blood pressure 114/68, pulse 90, temperature 97.7 F (36.5 C), temperature source Oral, resp. rate 19, height '4\' 11"'$  (1.499 m), weight 79.878 kg (176 lb 1.6 oz), SpO2 95 %.    PHYSICAL EXAMINATION:  GENERAL:  60 y.o.-year-old patient lying in the bed with no acute distress.  EYES: Pupils equal, round, reactive to light and accommodation. No scleral icterus. Extraocular muscles intact.  HEENT: Head atraumatic, normocephalic. Oropharynx and nasopharynx clear.  NECK:  Supple, no jugular venous distention. No thyroid enlargement, no tenderness.  LUNGS: Normal breath sounds bilaterally, expiratory wheezing mostly in the lung bases, no rales,rhonchi or crepitation. No use of accessory muscles of respiration.  CARDIOVASCULAR: S1, S2 normal. No murmurs, rubs, or gallops.  ABDOMEN: Soft, non-tender, non-distended. Bowel sounds present. No organomegaly or mass.  EXTREMITIES: Trace edema, no cyanosis, or clubbing.  NEUROLOGIC: Cranial nerves II through XII are intact. Muscle strength 5/5 in all extremities. Sensation intact. Gait not checked.  PSYCHIATRIC: The patient is alert and oriented x 3.  SKIN:  No obvious rash, lesion, or ulcer.   DATA REVIEW:   CBC  Recent Labs Lab 03/23/15 0449  WBC 7.9  HGB 12.9  HCT 38.0  PLT 309    Chemistries   Recent Labs Lab 03/21/15 1938  03/23/15 0449  NA 135  < > 138  K 4.3  < > 4.3  CL 101  < > 104  CO2 26  < > 22  GLUCOSE 111*  < > 296*  BUN 9  < > 14  CREATININE 0.51  < > 0.63  CALCIUM 9.1  < > 9.2  AST 28  --   --   ALT 17  --   --   ALKPHOS 80  --   --   BILITOT 1.2  --   --   < > = values in this interval not displayed.  Cardiac Enzymes  Recent Labs Lab 03/22/15 1433  TROPONINI 0.04*    Microbiology Results  Results for orders placed or performed during the hospital encounter of 03/21/15  Blood Culture (routine x 2)      Status: None (Preliminary result)   Collection Time: 03/21/15  7:59 PM  Result Value Ref Range Status   Specimen Description BLOOD LEFT ARM  Final   Special Requests BOTTLES DRAWN AEROBIC AND ANAEROBIC 7ML  Final   Culture NO GROWTH 2 DAYS  Final   Report Status PENDING  Incomplete  Blood Culture (routine x 2)     Status: None (Preliminary result)   Collection Time: 03/21/15  8:04 PM  Result Value Ref Range Status   Specimen Description BLOOD RIGHT ARM  Final   Special Requests BOTTLES DRAWN AEROBIC AND ANAEROBIC 8ML  Final   Culture NO GROWTH 2 DAYS  Final   Report Status PENDING  Incomplete  Urine culture     Status: None   Collection Time: 03/21/15  8:20 PM  Result Value Ref Range Status   Specimen Description URINE, RANDOM  Final   Special Requests NONE  Final   Culture MULTIPLE SPECIES PRESENT, SUGGEST RECOLLECTION  Final   Report Status 03/23/2015 FINAL  Final    RADIOLOGY:  Dg Chest 1 View  03/23/2015  CLINICAL DATA:  Cough EXAM: CHEST 1 VIEW COMPARISON:  03/21/2015 FINDINGS: Normal heart size and mediastinal contours. Chronic hyperinflation and bronchitic markings. There is no edema, consolidation, effusion, or pneumothorax. IMPRESSION: COPD without acute superimposed finding. Electronically Signed   By: Monte Fantasia M.D.   On: 03/23/2015 07:24   Dg Chest 2 View  03/21/2015  CLINICAL DATA:  Shortness of breath of began at 3 p.m. today. EXAM: CHEST  2 VIEW COMPARISON:  06/12/2014 FINDINGS: Hyperexpansion is consistent with emphysema. The lungs are clear wiithout focal pneumonia, edema, pneumothorax or pleural effusion. Interstitial markings are diffusely coarsened with chronic features. Cardiopericardial silhouette is at upper limits of normal for size. The visualized bony structures of the thorax are intact. IMPRESSION: Stable.  Emphysema without acute cardiopulmonary findings. Electronically Signed   By: Misty Stanley M.D.   On: 03/21/2015 20:11    Management plans  discussed with the patient, and she is in agreement.  CODE STATUS:     Code Status Orders        Start     Ordered   03/21/15 2357  Full code   Continuous     03/21/15 2356    Code Status History    Date Active Date Inactive Code Status Order ID Comments User Context   06/12/2014  8:07 AM 06/12/2014  7:04 PM Full Code [id number]  Juluis Mire, MD Inpatient      TOTAL TIME TAKING CARE OF THIS PATIENT: 35 minutes.    Loletha Grayer M.D on 03/23/2015 at 4:28 PM  Between 7am to 6pm - Pager - [phone]  After 6pm go to [email or web address] - password [email or web address] [facility]  Office  [phone]  CC: Primary care physician; Lelon Huh, MD

## 2015-03-23 NOTE — Progress Notes (Signed)
Inpatient Diabetes Program Recommendations  AACE/ADA: New Consensus Statement on Inpatient Glycemic Control (2015)  Target Ranges:  Prepandial:   less than 140 mg/dL      Peak postprandial:   less than 180 mg/dL (1-2 hours)      Critically ill patients:  140 - 180 mg/dL   Review of Glycemic Control  Results for Yvette Riley, Yvette Riley (MRN [id number]) as of 03/23/2015 10:17  Ref. Range 03/22/2015 16:37 03/22/2015 21:31 03/23/2015 07:37  Glucose-Capillary Latest Ref Range: 65-99 mg/dL 277 (H) 298 (H) 285 (H)    Diabetes history: yes Outpatient Diabetes medications: Glucophage '1000mg'$  bid Current orders for Inpatient glycemic control: Novolog 0-5units qhs, Novolog 0-9 units tid, Prednisone '40mg'$  qam  Inpatient Diabetes Program Recommendations: CBG elevated likely as a result of steroids.  Consider low dose basal insulin Lantus 8 units qhs while on steroids, wean as steroids are tapered.  Increase Novolog correction to moderate correction scale 0-15 units tid.  Gentry Fitz, RN, BA, MHA, CDE Diabetes Coordinator Inpatient Diabetes Program  [phone] (Team Pager) [phone] ([place]) 03/23/2015 10:19 AM

## 2015-03-23 NOTE — Care Management Important Message (Signed)
Important Message  Patient Details  Name: Yvette Riley MRN: [id number] Date of Birth: [birth year]   Medicare Important Message Given:  Yes    Beverly Sessions, RN 03/23/2015, 11:34 AM

## 2015-03-26 LAB — CULTURE, BLOOD (ROUTINE X 2)
CULTURE: NO GROWTH
CULTURE: NO GROWTH

## 2015-03-29 ENCOUNTER — Encounter: Payer: Self-pay | Admitting: Emergency Medicine

## 2015-03-29 ENCOUNTER — Inpatient Hospital Stay
Admission: EM | Admit: 2015-03-29 | Discharge: 2015-03-31 | DRG: 190 | Disposition: A | Discharge status: Home / Self Care | Payer: Medicare Other | Attending: Specialist | Admitting: Specialist

## 2015-03-29 ENCOUNTER — Emergency Department: Payer: Medicare Other

## 2015-03-29 DIAGNOSIS — J441 Chronic obstructive pulmonary disease with (acute) exacerbation: Secondary | ICD-10-CM | POA: Diagnosis not present

## 2015-03-29 DIAGNOSIS — J45909 Unspecified asthma, uncomplicated: Secondary | ICD-10-CM | POA: Diagnosis present

## 2015-03-29 DIAGNOSIS — J181 Lobar pneumonia, unspecified organism: Secondary | ICD-10-CM | POA: Diagnosis not present

## 2015-03-29 DIAGNOSIS — Z720 Tobacco use: Secondary | ICD-10-CM | POA: Diagnosis not present

## 2015-03-29 DIAGNOSIS — Z825 Family history of asthma and other chronic lower respiratory diseases: Secondary | ICD-10-CM

## 2015-03-29 DIAGNOSIS — J189 Pneumonia, unspecified organism: Secondary | ICD-10-CM | POA: Diagnosis not present

## 2015-03-29 DIAGNOSIS — Z8249 Family history of ischemic heart disease and other diseases of the circulatory system: Secondary | ICD-10-CM

## 2015-03-29 DIAGNOSIS — Z91030 Bee allergy status: Secondary | ICD-10-CM | POA: Diagnosis not present

## 2015-03-29 DIAGNOSIS — Z880 Allergy status to penicillin: Secondary | ICD-10-CM

## 2015-03-29 DIAGNOSIS — R0602 Shortness of breath: Secondary | ICD-10-CM | POA: Diagnosis not present

## 2015-03-29 DIAGNOSIS — M5126 Other intervertebral disc displacement, lumbar region: Secondary | ICD-10-CM | POA: Diagnosis present

## 2015-03-29 DIAGNOSIS — Y95 Nosocomial condition: Secondary | ICD-10-CM | POA: Diagnosis present

## 2015-03-29 DIAGNOSIS — R0600 Dyspnea, unspecified: Secondary | ICD-10-CM | POA: Diagnosis not present

## 2015-03-29 DIAGNOSIS — T380X5A Adverse effect of glucocorticoids and synthetic analogues, initial encounter: Secondary | ICD-10-CM | POA: Diagnosis present

## 2015-03-29 DIAGNOSIS — Z823 Family history of stroke: Secondary | ICD-10-CM

## 2015-03-29 DIAGNOSIS — J9621 Acute and chronic respiratory failure with hypoxia: Secondary | ICD-10-CM | POA: Diagnosis not present

## 2015-03-29 DIAGNOSIS — E119 Type 2 diabetes mellitus without complications: Secondary | ICD-10-CM

## 2015-03-29 DIAGNOSIS — Z9981 Dependence on supplemental oxygen: Secondary | ICD-10-CM | POA: Diagnosis not present

## 2015-03-29 DIAGNOSIS — D72829 Elevated white blood cell count, unspecified: Secondary | ICD-10-CM | POA: Diagnosis not present

## 2015-03-29 DIAGNOSIS — Z888 Allergy status to other drugs, medicaments and biological substances status: Secondary | ICD-10-CM | POA: Diagnosis not present

## 2015-03-29 DIAGNOSIS — I10 Essential (primary) hypertension: Secondary | ICD-10-CM | POA: Diagnosis present

## 2015-03-29 DIAGNOSIS — Z885 Allergy status to narcotic agent status: Secondary | ICD-10-CM | POA: Diagnosis not present

## 2015-03-29 DIAGNOSIS — R05 Cough: Secondary | ICD-10-CM | POA: Diagnosis not present

## 2015-03-29 DIAGNOSIS — J440 Chronic obstructive pulmonary disease with acute lower respiratory infection: Secondary | ICD-10-CM | POA: Diagnosis present

## 2015-03-29 DIAGNOSIS — R000 Tachycardia, unspecified: Secondary | ICD-10-CM | POA: Diagnosis present

## 2015-03-29 DIAGNOSIS — R0902 Hypoxemia: Secondary | ICD-10-CM

## 2015-03-29 DIAGNOSIS — R509 Fever, unspecified: Secondary | ICD-10-CM | POA: Diagnosis not present

## 2015-03-29 DIAGNOSIS — F17210 Nicotine dependence, cigarettes, uncomplicated: Secondary | ICD-10-CM | POA: Diagnosis not present

## 2015-03-29 HISTORY — DX: Pneumonia, unspecified organism: J18.9

## 2015-03-29 LAB — CBC
HCT: 37.1 % (ref 35.0–47.0)
HEMOGLOBIN: 12.9 g/dL (ref 12.0–16.0)
MCH: 37.2 pg — AB (ref 26.0–34.0)
MCHC: 34.8 g/dL (ref 32.0–36.0)
MCV: 107 fL — ABNORMAL HIGH (ref 80.0–100.0)
Platelets: 267 10*3/uL (ref 150–440)
RBC: 3.46 MIL/uL — AB (ref 3.80–5.20)
RDW: 15.4 % — ABNORMAL HIGH (ref 11.5–14.5)
WBC: 16.8 10*3/uL — ABNORMAL HIGH (ref 3.6–11.0)

## 2015-03-29 LAB — TROPONIN I

## 2015-03-29 LAB — GLUCOSE, CAPILLARY
GLUCOSE-CAPILLARY: 152 mg/dL — AB (ref 65–99)
Glucose-Capillary: 189 mg/dL — ABNORMAL HIGH (ref 65–99)

## 2015-03-29 LAB — BASIC METABOLIC PANEL
Anion gap: 8 (ref 5–15)
BUN: 12 mg/dL (ref 6–20)
CALCIUM: 8.8 mg/dL — AB (ref 8.9–10.3)
CHLORIDE: 97 mmol/L — AB (ref 101–111)
CO2: 30 mmol/L (ref 22–32)
Creatinine, Ser: 0.58 mg/dL (ref 0.44–1.00)
GFR calc Af Amer: >60 mL/min (ref >60)
GFR calc non Af Amer: >60 mL/min (ref >60)
GLUCOSE: 155 mg/dL — AB (ref 65–99)
POTASSIUM: 3.8 mmol/L (ref 3.5–5.1)
SODIUM: 135 mmol/L (ref 135–145)

## 2015-03-29 MED ORDER — ACETAMINOPHEN 650 MG RE SUPP: 650.0000 mg | Freq: Four times a day (QID) | RECTAL | Status: DC | PRN

## 2015-03-29 MED ORDER — VANCOMYCIN HCL IN DEXTROSE 1-5 GM/200ML-% IV SOLN
1000.0000 mg | INTRAVENOUS | Status: DC
  Administered 2015-03-30: 1000 mg via INTRAVENOUS
  Filled 2015-03-29 (×2): qty 200

## 2015-03-29 MED ORDER — ENOXAPARIN SODIUM 40 MG/0.4ML ~~LOC~~ SOLN
40.0000 mg | SUBCUTANEOUS | Status: DC
  Administered 2015-03-29 – 2015-03-30 (×2): 40 mg via SUBCUTANEOUS
  Filled 2015-03-29 (×2): qty 0.4

## 2015-03-29 MED ORDER — SODIUM CHLORIDE 0.9 % IV SOLN
Freq: Once | INTRAVENOUS | Status: AC
  Administered 2015-03-29: 22:00:00 via INTRAVENOUS

## 2015-03-29 MED ORDER — ZOLPIDEM TARTRATE 5 MG PO TABS
10.0000 mg | ORAL_TABLET | Freq: Every evening | ORAL | Status: DC | PRN
  Administered 2015-03-29 – 2015-03-31 (×2): 10 mg via ORAL
  Filled 2015-03-29 (×2): qty 2

## 2015-03-29 MED ORDER — ONDANSETRON HCL 4 MG PO TABS: 4.0000 mg | ORAL_TABLET | Freq: Four times a day (QID) | ORAL | Status: DC | PRN

## 2015-03-29 MED ORDER — DOCUSATE SODIUM 100 MG PO CAPS
100.0000 mg | ORAL_CAPSULE | Freq: Two times a day (BID) | ORAL | Status: DC
  Administered 2015-03-29 – 2015-03-31 (×4): 100 mg via ORAL
  Filled 2015-03-29 (×4): qty 1

## 2015-03-29 MED ORDER — VANCOMYCIN HCL IN DEXTROSE 1-5 GM/200ML-% IV SOLN
1000.0000 mg | Freq: Once | INTRAVENOUS | Status: AC
  Administered 2015-03-30: 1000 mg via INTRAVENOUS
  Filled 2015-03-29: qty 200

## 2015-03-29 MED ORDER — CETYLPYRIDINIUM CHLORIDE 0.05 % MT LIQD
7.0000 mL | Freq: Two times a day (BID) | OROMUCOSAL | Status: DC
  Administered 2015-03-30 – 2015-03-31 (×2): 7 mL via OROMUCOSAL

## 2015-03-29 MED ORDER — HYDROCODONE-ACETAMINOPHEN 5-325 MG PO TABS
1.0000 | ORAL_TABLET | Freq: Once | ORAL | Status: AC
  Administered 2015-03-29: 1 via ORAL
  Filled 2015-03-29: qty 1

## 2015-03-29 MED ORDER — MORPHINE SULFATE (PF) 2 MG/ML IV SOLN: 1.0000 mg | INTRAVENOUS | Status: DC | PRN

## 2015-03-29 MED ORDER — ONDANSETRON HCL 4 MG/2ML IJ SOLN
4.0000 mg | Freq: Four times a day (QID) | INTRAMUSCULAR | Status: DC | PRN
  Administered 2015-03-30: 4 mg via INTRAVENOUS
  Filled 2015-03-29: qty 2

## 2015-03-29 MED ORDER — INSULIN ASPART 100 UNIT/ML ~~LOC~~ SOLN
0.0000 [IU] | Freq: Three times a day (TID) | SUBCUTANEOUS | Status: DC
  Administered 2015-03-29 – 2015-03-30 (×2): 2 [IU] via SUBCUTANEOUS
  Administered 2015-03-30: 3 [IU] via SUBCUTANEOUS
  Administered 2015-03-31 (×2): 7 [IU] via SUBCUTANEOUS
  Filled 2015-03-29 (×2): qty 2
  Filled 2015-03-29: qty 7
  Filled 2015-03-29: qty 3
  Filled 2015-03-29: qty 7

## 2015-03-29 MED ORDER — ACETAMINOPHEN 325 MG PO TABS
650.0000 mg | ORAL_TABLET | Freq: Four times a day (QID) | ORAL | Status: DC | PRN
  Administered 2015-03-30: 650 mg via ORAL
  Filled 2015-03-29: qty 2

## 2015-03-29 MED ORDER — SENNOSIDES-DOCUSATE SODIUM 8.6-50 MG PO TABS: 1.0000 | ORAL_TABLET | Freq: Every evening | ORAL | Status: DC | PRN

## 2015-03-29 MED ORDER — HYDROCODONE-ACETAMINOPHEN 5-325 MG PO TABS
1.0000 | ORAL_TABLET | ORAL | Status: DC | PRN
  Administered 2015-03-30 (×3): 2 via ORAL
  Administered 2015-03-30: 1 via ORAL
  Administered 2015-03-31 (×2): 2 via ORAL
  Filled 2015-03-29: qty 1
  Filled 2015-03-29 (×5): qty 2

## 2015-03-29 MED ORDER — LEVOFLOXACIN IN D5W 750 MG/150ML IV SOLN
750.0000 mg | INTRAVENOUS | Status: DC
  Administered 2015-03-29 – 2015-03-30 (×2): 750 mg via INTRAVENOUS
  Filled 2015-03-29 (×3): qty 150

## 2015-03-29 NOTE — ED Provider Notes (Signed)
[facility] Emergency Department Provider Note  ____________________________________________  Time seen: Approximately 3:54 PM  I have reviewed the triage vital signs and the nursing notes.   HISTORY  Chief Complaint Shortness of Breath    HPI Yvette Riley is a 60 y.o. female who was recently discharged from the hospital with COPD exacerbation touch with the flu and possibly a small pneumonia. Patient reports increasing shortness of breath fever and cough. Patient comes back to the hospital. Chest x-ray here shows bilateral pneumonia. Patient is tachycardic and hypoxic down to 88 on room air while sitting still. Patient reports she has her throat close up with penicillin. Will admit patient so therefore duration several days timing several days severity severe due to the hypoxia quality cough and shortness of breath no recent surgery no other symptoms   Past Medical History  Diagnosis Date  . Displacement of lumbar intervertebral disc without myelopathy   . History of chicken pox   . COPD (chronic obstructive pulmonary disease) [facility])     Patient Active Problem List   Diagnosis Date Noted  . Pneumonia 03/29/2015  . COPD exacerbation ([place]) 03/21/2015  . Sepsis ([place]) 03/21/2015  . CAP (community acquired pneumonia) 03/21/2015  . Allergic rhinitis 09/27/2014  . Asthma 09/27/2014  . Anemia 09/27/2014  . Hyperglyceridemia, pure 09/27/2014  . Glaucoma 09/27/2014  . Hip pain 09/27/2014  . Right knee pain 09/27/2014  . Displacement of lumbar intervertebral disc without myelopathy 09/27/2014  . Thoracic back pain 09/27/2014  . Insomnia 08/11/2014  . COPD (chronic obstructive pulmonary disease) ([place]) 06/12/2014  . Type 2 diabetes mellitus ([place]) 06/12/2014  . Back pain 06/12/2014  . Essential hypertension 03/28/2014  . Tobacco use 03/28/2014    Past Surgical History  Procedure Laterality Date  . Cesarean section    . Tubal ligation      No  current outpatient prescriptions on file.  Allergies Codeine; Crestor; Penicillins; Yellow jacket venom; Gemfibrozil; and Lipitor  Family History  Problem Relation Age of Onset  . Heart failure Mother   . Heart disease Mother   . Stroke Mother   . Heart disease Brother   . COPD Brother   . Cancer Maternal Aunt     Breast Cancer    Social History Social History  Substance Use Topics  . Smoking status: Current Every Day Smoker -- 1.00 packs/day    Types: Cigarettes  . Smokeless tobacco: None  . Alcohol Use: No    Review of Systems Constitutional:fever/chills Eyes: No visual changes. ENT: No sore throat. Cardiovascular: Denies chest pain. Respiratory:  shortness of breath. Gastrointestinal: No abdominal pain.  No nausea, no vomiting.  No diarrhea.  No constipation. Genitourinary: Negative for dysuria. Musculoskeletal: Negative for back pain. Skin: Negative for rash. Neurological: Negative for headaches, focal weakness or numbness.  10-point ROS otherwise negative.  ____________________________________________   PHYSICAL EXAM:  VITAL SIGNS: ED Triage Vitals  Enc Vitals Group     BP 03/29/15 1110 136/74 mmHg     Pulse Rate 03/29/15 1110 108     Resp 03/29/15 1110 22     Temp 03/29/15 1110 98.6 F (37 C)     Temp Source 03/29/15 1110 Oral     SpO2 03/29/15 1110 93 %     Weight 03/29/15 1110 172 lb (78.019 kg)     Height 03/29/15 1110 '4\' 11"'$  (1.499 m)     Head Cir --      Peak Flow --  Pain Score 03/29/15 1110 6     Pain Loc --      Pain Edu? --      Excl. in [place]? --     Constitutional: Alert and oriented. Well appearing and in no acute distress. Eyes: Conjunctivae are normal. PERRL. EOMI. Head: Atraumatic. Nose: No congestion/rhinnorhea. Mouth/Throat: Mucous membranes are moist.  Oropharynx non-erythematous. Neck: No stridor.   Cardiovascular: Normal rate, regular rhythm. Grossly normal heart sounds.  Good peripheral circulation. Respiratory: Normal  respiratory effort.  No retractions. Lungs crackles Gastrointestinal: Soft and nontender. No distention. No abdominal bruits. No CVA tenderness. Musculoskeletal: No lower extremity tenderness nor edema.  No joint effusions. Neurologic:  Normal speech and language. No gross focal neurologic deficits are appreciated. No gait instability. Skin:  Skin is warm, dry and intact. No rash noted. Psychiatric: Mood and affect are normal. Speech and behavior are normal.  ____________________________________________   LABS (all labs ordered are listed, but only abnormal results are displayed)  Labs Reviewed  BASIC METABOLIC PANEL - Abnormal; Notable for the following:    Chloride 97 (*)    Glucose, Bld 155 (*)    Calcium 8.8 (*)    All other components within normal limits  CBC - Abnormal; Notable for the following:    WBC 16.8 (*)    RBC 3.46 (*)    MCV 107.0 (*)    MCH 37.2 (*)    RDW 15.4 (*)    All other components within normal limits  GLUCOSE, CAPILLARY - Abnormal; Notable for the following:    Glucose-Capillary 189 (*)    All other components within normal limits  CULTURE, BLOOD (ROUTINE X 2)  CULTURE, BLOOD (ROUTINE X 2)  CULTURE, BLOOD (ROUTINE X 2)  CULTURE, BLOOD (ROUTINE X 2)  GRAM STAIN  TROPONIN I  STREP PNEUMONIAE URINARY ANTIGEN   ____________________________________________  EKG  EKG read and is turbid by me shows sinus tachycardia rate of 107 normal axis no acute changes ____________________________________________  RADIOLOGY  Chest x-ray read by radiology as pneumonia ____________________________________________   PROCEDURES    ____________________________________________   INITIAL IMPRESSION / ASSESSMENT AND PLAN / ED COURSE  Pertinent labs & imaging results that were available during my care of the patient were reviewed by me and considered in my medical decision making (see chart for  details).   ____________________________________________   FINAL CLINICAL IMPRESSION(S) / ED DIAGNOSES  Final diagnoses:  Hypoxia  Healthcare-associated pneumonia  Dyspnea      Nena Polio, MD 03/29/15 2037

## 2015-03-29 NOTE — ED Notes (Signed)
Pt to ed with c/o sob, cough, fever, states sob increases with activity.  sats at triage 88% on ra.  Pt states she is supposed to be on o2 at 2lpm all the time but reports she left her o2 at home today.  Pt immediately placed on o2 at 2 lpm via [place], sats up to 94%.

## 2015-03-29 NOTE — H&P (Signed)
[facility] [place] NAME: Yvette Riley    MR#:  [id number]  DATE OF BIRTH:  [birth year]  DATE OF ADMISSION:  03/29/2015  PRIMARY CARE PHYSICIAN: Lelon Huh, MD   REQUESTING/REFERRING PHYSICIAN: dR MALINDA  CHIEF COMPLAINT:  COUGH, FEVER AND SOB   HISTORY OF PRESENT ILLNESS:  Yvette Riley  is a 60 y.o. female with a known history of COPD on oxygen 2 L with ongoing tobacco abuse, hypertension, and allergic rhinitis was recently discharged on February 1 after she was diagnosed with influenza she finished up a course of Tamiflu at home. Patient comes in today with progressive increasing shortness of breath cough and fever. Workup in the emergency room showed elevated white count and tachycardia and chest x-ray consistent with pneumonia. She is being admitted for further evaluation and management.  PAST MEDICAL HISTORY:   Past Medical History  Diagnosis Date  . Displacement of lumbar intervertebral disc without myelopathy   . History of chicken pox   . COPD (chronic obstructive pulmonary disease) ([place])     PAST SURGICAL HISTOIRY:   Past Surgical History  Procedure Laterality Date  . Cesarean section    . Tubal ligation      SOCIAL HISTORY:   Social History  Substance Use Topics  . Smoking status: Current Every Day Smoker -- 1.00 packs/day  . Smokeless tobacco: Not on file  . Alcohol Use: No    FAMILY HISTORY:   Family History  Problem Relation Age of Onset  . Heart failure Mother   . Heart disease Mother   . Stroke Mother   . Heart disease Brother   . COPD Brother   . Cancer Maternal Aunt     Breast Cancer    DRUG ALLERGIES:   Allergies  Allergen Reactions  . Codeine Anaphylaxis  . Crestor [Rosuvastatin] Anaphylaxis  . Penicillins Anaphylaxis, Rash and Other (See Comments)    Reaction:  Tongue swelling  Has patient had a PCN reaction causing immediate rash, facial/tongue/throat swelling, SOB or  lightheadedness with hypotension:  Yes   Has patient had a PCN reaction causing severe rash involving mucus membranes or skin necrosis: No Has patient had a PCN reaction that required hospitalization No Has patient had a PCN reaction occurring within the last 10 years: No If all of the above answers are "NO", then may proceed with Cephalosporin use.  . Yellow Jacket Venom [Bee Venom] Anaphylaxis  . Gemfibrozil Rash  . Lipitor [Atorvastatin] Rash    REVIEW OF SYSTEMS:  Review of Systems  Constitutional: Positive for fever. Negative for chills and weight loss.  HENT: Positive for congestion. Negative for ear discharge, ear pain and nosebleeds.   Eyes: Negative for blurred vision, pain and discharge.  Respiratory: Positive for cough, sputum production and shortness of breath. Negative for wheezing and stridor.   Cardiovascular: Negative for chest pain, palpitations, orthopnea and PND.  Gastrointestinal: Negative for nausea, vomiting, abdominal pain and diarrhea.  Genitourinary: Negative for urgency and frequency.  Musculoskeletal: Negative for back pain and joint pain.  Neurological: Positive for weakness. Negative for sensory change, speech change and focal weakness.  Psychiatric/Behavioral: Negative for depression and hallucinations. The patient is not nervous/anxious.   All other systems reviewed and are negative.    MEDICATIONS AT HOME:   Prior to Admission medications   Medication Sig Start Date End Date Taking? Authorizing Provider  aspirin EC 81 MG tablet Take 81 mg by mouth at bedtime.  Yes Historical Provider, MD  fluticasone (FLOVENT HFA) 110 MCG/ACT inhaler Inhale 2 puffs into the lungs 2 (two) times daily as needed (for shortness of breath/wheezing).   Yes Historical Provider, MD  HYDROcodone-acetaminophen (NORCO) 10-325 MG tablet Take 1 tablet by mouth every 4 (four) hours as needed for moderate pain.   Yes Historical Provider, MD  levalbuterol [facility] HFA) 45 MCG/ACT  inhaler Inhale 1-2 puffs into the lungs every 4 (four) hours as needed for wheezing or shortness of breath.   Yes Historical Provider, MD  meloxicam (MOBIC) 15 MG tablet Take 15 mg by mouth daily as needed for pain.   Yes Historical Provider, MD  metFORMIN (GLUCOPHAGE) 500 MG tablet Take 500 mg by mouth 2 (two) times daily with a meal.   Yes Historical Provider, MD  zolpidem (AMBIEN) 10 MG tablet Take 10 mg by mouth at bedtime.   Yes Historical Provider, MD  metFORMIN (GLUCOPHAGE-XR) 500 MG 24 hr tablet Take 2 tablets (1,000 mg total) by mouth 2 (two) times daily before a meal. Patient not taking: Reported on 03/21/2015 01/04/15   Birdie Sons, MD  oseltamivir (TAMIFLU) 75 MG capsule Take 1 capsule (75 mg total) by mouth 2 (two) times daily. Patient not taking: Reported on 03/29/2015 03/23/15   Loletha Grayer, MD  tiZANidine (ZANAFLEX) 4 MG tablet Take 1 tablet (4 mg total) by mouth every 6 (six) hours as needed for muscle spasms. Patient not taking: Reported on 03/21/2015 12/13/14   Birdie Sons, MD      VITAL SIGNS:  Blood pressure 118/78, pulse 115, temperature 98.6 F (37 C), temperature source Oral, resp. rate 28, height '4\' 11"'$  (1.499 m), weight 78.019 kg (172 lb), SpO2 92 %.  PHYSICAL EXAMINATION:  GENERAL:  60 y.o.-year-old patient lying in the bed with no acute distress.  EYES: Pupils equal, round, reactive to light and accommodation. No scleral icterus. Extraocular muscles intact.  HEENT: Head atraumatic, normocephalic. Oropharynx and nasopharynx clear.  NECK:  Supple, no jugular venous distention. No thyroid enlargement, no tenderness.  LUNGS: Decreased breath sounds bilaterally, no wheezing, rales,rhonchi or crepitation. No use of accessory muscles of respiration.  CARDIOVASCULAR: S1, S2 normal. No murmurs, rubs, or gallops.  ABDOMEN: Soft, nontender, nondistended. Bowel sounds present. No organomegaly or mass.  EXTREMITIES: No pedal edema, cyanosis, or clubbing.  NEUROLOGIC:  Cranial nerves II through XII are intact. Muscle strength 5/5 in all extremities. Sensation intact. Gait not checked.  PSYCHIATRIC: The patient is alert and oriented x 3.  SKIN: No obvious rash, lesion, or ulcer.   LABORATORY PANEL:   CBC  Recent Labs Lab 03/29/15 1122  WBC 16.8*  HGB 12.9  HCT 37.1  PLT 267   ------------------------------------------------------------------------------------------------------------------  Chemistries   Recent Labs Lab 03/29/15 1122  NA 135  K 3.8  CL 97*  CO2 30  GLUCOSE 155*  BUN 12  CREATININE 0.58  CALCIUM 8.8*   ------------------------------------------------------------------------------------------------------------------  Cardiac Enzymes  Recent Labs Lab 03/29/15 1122  TROPONINI <0.03   ------------------------------------------------------------------------------------------------------------------  RADIOLOGY:  Dg Chest 2 View  03/29/2015  CLINICAL DATA:  Cough, congestion, fever for more than 1 week. Shortness of breath. EXAM: CHEST  2 VIEW COMPARISON:  03/23/2015 FINDINGS: Airspace opacities noted in both in the right middle lobe and lingula concerning for pneumonia. Mild peribronchial thickening. Heart is normal size. No effusions or acute bony abnormality. IMPRESSION: Bronchitic changes. Airspace opacities in the right middle lobe and lingula concerning for pneumonia. Electronically Signed   By: Rolm Baptise  M.D.   On: 03/29/2015 11:59    EKG:   Sinus tachycardia IMPRESSION AND PLAN:   Yvette Riley  is a 60 y.o. female with a known history of COPD on oxygen 2 L with ongoing tobacco abuse, hypertension, and allergic rhinitis was recently discharged on February 1 after she was diagnosed with influenza she finished up a course of Tamiflu at home. Patient comes in today with progressive increasing shortness of breath cough and fever  1. Acute on chronic hypoxic respiratory failure secondary to COPD exacerbation and  right-sided pneumonia -Admitted to medical floor -IV Levaquin and vancomycin -Follow blood cultures sputum culture and white count -Patient is not wheezing currently on hold off on any steroids  2. Pneumonia right-sided appears as part of complication due to recently treated influenza A -Treatment as above  3. Tobacco abuse smoking cessation advised to 3 minutes spent patient does not appear to be motivated  4. Type 2 diabetes sliding scale and metformin  5. DVT prophylaxis subcutaneous Lovenox All the records are reviewed and case discussed with ED provider. Management plans discussed with the patient, family and they are in agreement.  CODE STATUS: Full  TOTAL TIME TAKING CARE OF THIS PATIENT: 45 minutes.    Yvette Riley M.D on 03/29/2015 at 4:12 PM  Between 7am to 6pm - Pager - [phone]  After 6pm go to [email or web address] - password [email or web address] [facility]  Office  [phone]  CC: Primary care physician; Lelon Huh, MD

## 2015-03-29 NOTE — ED Notes (Signed)
CBG 189.

## 2015-03-29 NOTE — ED Notes (Signed)
Attempted to call report. No answer. Will try again.

## 2015-03-29 NOTE — Progress Notes (Signed)
Pharmacy Antibiotic Note  Yvette Riley is a 60 y.o. female admitted on 03/29/2015 with pneumonia.  Pharmacy has been consulted for Vancomycin/Levaquin dosing. Pneumonia right-sided appears as part of complication due to recently treated influenza A  Plan: Vancomycin 1 gram IV x 1 ordered. Will give stacked dosing Vancomycin 1000 IV every 18 hours.  Goal trough 15-20 mcg/mL.  Ke 0.05  T1/2 13.86   Vd 40.95 Will order Vancomycin trough prior to 5th dose on 3/4.  Will continue current order for Levaquin '750mg'$  IV q24h.[name]  Height: '4\' 11"'$  (149.9 cm) Weight: 172 lb (78.019 kg) IBW/kg (Calculated) : 43.2  Adj BW= 58.5 kg  Temp (24hrs), Avg:99.1 F (37.3 C), Min:98.6 F (37 C), Max:99.6 F (37.6 C)   Recent Labs Lab 03/23/15 0449 03/29/15 1122  WBC 7.9 16.8*  CREATININE 0.63 0.58    Estimated Creatinine Clearance: 69.1 mL/min (by C-G formula based on Cr of 0.58).    Allergies  Allergen Reactions  . Codeine Anaphylaxis  . Crestor [Rosuvastatin] Anaphylaxis  . Penicillins Anaphylaxis, Rash and Other (See Comments)    Reaction:  Tongue swelling  Has patient had a PCN reaction causing immediate rash, facial/tongue/throat swelling, SOB or lightheadedness with hypotension:  Yes   Has patient had a PCN reaction causing severe rash involving mucus membranes or skin necrosis: No Has patient had a PCN reaction that required hospitalization No Has patient had a PCN reaction occurring within the last 10 years: No If all of the above answers are "NO", then may proceed with Cephalosporin use.  . Yellow Jacket Venom [Bee Venom] Anaphylaxis  . Gemfibrozil Rash  . Lipitor [Atorvastatin] Rash    Antimicrobials this admission: Levaquin  3/1 >>  Vancomycin 3/1 >>   Dose adjustments this admission:   Microbiology results: 3/1 BCx: pending  UCx:   Sputum:   MRSA PCR  Thank you for allowing pharmacy to be a part of this patient's care.  Layani Foronda A 03/29/2015 8:37 PM

## 2015-03-29 NOTE — ED Notes (Signed)
Patient placed on 4L [place] due to SpO2 92%. Tolerating well.

## 2015-03-30 ENCOUNTER — Inpatient Hospital Stay: Payer: Medicare Other | Admitting: Family Medicine

## 2015-03-30 LAB — GLUCOSE, CAPILLARY
GLUCOSE-CAPILLARY: 242 mg/dL — AB (ref 65–99)
GLUCOSE-CAPILLARY: 171 mg/dL — AB (ref 65–99)
GLUCOSE-CAPILLARY: 333 mg/dL — AB (ref 65–99)

## 2015-03-30 LAB — MRSA PCR SCREENING: MRSA BY PCR: NEGATIVE

## 2015-03-30 LAB — STREP PNEUMONIAE URINARY ANTIGEN: Strep Pneumo Urinary Antigen: NEGATIVE

## 2015-03-30 MED ORDER — METHYLPREDNISOLONE SODIUM SUCC 40 MG IJ SOLR
40.0000 mg | Freq: Three times a day (TID) | INTRAMUSCULAR | Status: DC
  Administered 2015-03-30 – 2015-03-31 (×3): 40 mg via INTRAVENOUS
  Filled 2015-03-30 (×3): qty 1

## 2015-03-30 MED ORDER — IPRATROPIUM-ALBUTEROL 0.5-2.5 (3) MG/3ML IN SOLN
3.0000 mL | Freq: Four times a day (QID) | RESPIRATORY_TRACT | Status: DC
  Administered 2015-03-30 – 2015-03-31 (×5): 3 mL via RESPIRATORY_TRACT
  Filled 2015-03-30 (×5): qty 3

## 2015-03-30 MED ORDER — BUDESONIDE 0.25 MG/2ML IN SUSP
0.2500 mg | Freq: Two times a day (BID) | RESPIRATORY_TRACT | Status: DC
  Administered 2015-03-30 – 2015-03-31 (×2): 0.25 mg via RESPIRATORY_TRACT
  Filled 2015-03-30 (×3): qty 2

## 2015-03-30 MED ORDER — GLUCERNA SHAKE PO LIQD
237.0000 mL | Freq: Every day | ORAL | Status: DC
  Administered 2015-03-30: 237 mL via ORAL

## 2015-03-30 MED ORDER — IBUPROFEN 200 MG PO TABS
400.0000 mg | ORAL_TABLET | Freq: Four times a day (QID) | ORAL | Status: DC | PRN
Start: 2015-03-30 — End: 2015-03-31
  Administered 2015-03-30: 400 mg via ORAL
  Filled 2015-03-30: qty 2

## 2015-03-30 MED ORDER — INSULIN ASPART 100 UNIT/ML ~~LOC~~ SOLN
12.0000 [IU] | Freq: Once | SUBCUTANEOUS | Status: AC
  Administered 2015-03-30: 12 [IU] via SUBCUTANEOUS
  Filled 2015-03-30: qty 12

## 2015-03-30 MED ORDER — IBUPROFEN 200 MG PO TABS: 400.0000 mg | ORAL_TABLET | Freq: Four times a day (QID) | ORAL | Status: DC | PRN

## 2015-03-30 NOTE — Progress Notes (Signed)
Initial Nutrition Assessment    INTERVENTION:   Meals and Snacks: Cater to patient preferences Medical Food Supplement Therapy: pt with poor appetite, interested in trying nutritional supplement. Will send Glucerna as 8pm snack, No Sugar Added Carnation Instant Breakfast as 3pm snack  NUTRITION DIAGNOSIS:   Inadequate oral intake related to acute illness as evidenced by per patient/family report.  GOAL:   Patient will meet greater than or equal to 90% of their needs  MONITOR:    (Energy Intake, Anthropometrics, Digestive System, Electrolyte/Renal profile, Glucose Profile)  REASON FOR ASSESSMENT:   Consult  (weight loss of 20 pounds)  ASSESSMENT:    Pt admitted with acute on chronic respiratory failure due to COPD and pneumonia, pt with recent flu as well  Past Medical History  Diagnosis Date  . Displacement of lumbar intervertebral disc without myelopathy   . History of chicken pox   . COPD (chronic obstructive pulmonary disease) ([facility])     Diet Order:  Diet Carb Modified Fluid consistency:: Thin; Room service appropriate?: Yes   Energy Intake: no recorded po intake, pt reports appetite is fair  Food and Nutrition Related history: pt reports she has not had an appetite for a while, eats because she know she has to, not because she wants to  Skin:  Reviewed, no issues  Last BM:  03/28/15   Nutrition Focused Physical Exam: Nutrition-Focused physical exam completed. Findings are WDL for fat depletion, muscle depletion, and edema.   Height:   Ht Readings from Last 1 Encounters:  03/29/15 '4\' 11"'$  (1.499 m)    Weight: pt reports 20 pound wt loss over several months; 10.4% wt loss; according to weight encounters, pt weighed 192 pounds in February of last year  Wt Readings from Last 1 Encounters:  03/29/15 172 lb (78.019 kg)    Filed Weights   03/29/15 1110  Weight: 172 lb (78.019 kg)   Wt Readings from Last 10 Encounters:  03/29/15 172 lb (78.019 kg)  03/21/15  176 lb 1.6 oz (79.878 kg)  03/15/15 173 lb (78.472 kg)  02/10/15 174 lb (78.926 kg)  12/13/14 174 lb (78.926 kg)  11/14/14 173 lb (78.472 kg)  09/30/14 178 lb (80.74 kg)  09/27/14 187 lb (84.823 kg)  06/12/14 177 lb 12.8 oz (80.65 kg)  03/28/14 191 lb 8 oz (86.864 kg)     BMI:  Body mass index is 34.72 kg/(m^2).  LOW Care Level  Kerman Passey MS, [place], LDN [phone] Pager  [phone] Weekend/On-Call Pager

## 2015-03-30 NOTE — Progress Notes (Signed)
Notified Dr. Jannifer Franklin of patient spiking a fever and abnormal VS.  He acknowledged and also put in an order for Ibuprofen

## 2015-03-30 NOTE — Progress Notes (Signed)
[facility] [place] NAME: Albina Gosney    MR#:  [id number]  DATE OF BIRTH:  Feb 12, 1955  SUBJECTIVE:   Pt. Here due to shortness of breath/COPD Exacerbation due to pneumonia.  Feels a bit better since yesterday.    REVIEW OF SYSTEMS:    Review of Systems  Constitutional: Negative for fever and chills.  HENT: Negative for congestion and tinnitus.   Eyes: Negative for blurred vision and double vision.  Respiratory: Positive for cough, sputum production, shortness of breath and wheezing.   Cardiovascular: Negative for chest pain, orthopnea and PND.  Gastrointestinal: Negative for nausea, vomiting, abdominal pain and diarrhea.  Genitourinary: Negative for dysuria and hematuria.  Neurological: Negative for dizziness, sensory change and focal weakness.  All other systems reviewed and are negative.   Nutrition: Carb control Tolerating Diet: Yes Tolerating PT: Ambulatory.   DRUG ALLERGIES:   Allergies  Allergen Reactions  . Codeine Anaphylaxis  . Crestor [Rosuvastatin] Anaphylaxis  . Penicillins Anaphylaxis, Rash and Other (See Comments)    Reaction:  Tongue swelling  Has patient had a PCN reaction causing immediate rash, facial/tongue/throat swelling, SOB or lightheadedness with hypotension:  Yes   Has patient had a PCN reaction causing severe rash involving mucus membranes or skin necrosis: No Has patient had a PCN reaction that required hospitalization No Has patient had a PCN reaction occurring within the last 10 years: No If all of the above answers are "NO", then may proceed with Cephalosporin use.  . Yellow Jacket Venom [Bee Venom] Anaphylaxis  . Gemfibrozil Rash  . Lipitor [Atorvastatin] Rash    VITALS:  Blood pressure 136/62, pulse 88, temperature 98.9 F (37.2 C), temperature source Oral, resp. rate 20, height '4\' 11"'$  (1.499 m), weight 78.019 kg (172 lb), SpO2 96 %.  PHYSICAL EXAMINATION:   Physical  Exam  GENERAL:  60 y.o.-year-old patient lying in the bed in no acute distress.  EYES: Pupils equal, round, reactive to light and accommodation. No scleral icterus. Extraocular muscles intact.  HEENT: Head atraumatic, normocephalic. Oropharynx and nasopharynx clear.  NECK:  Supple, no jugular venous distention. No thyroid enlargement, no tenderness.  LUNGS: Good A/E b/l, end- expiratory wheezing bilaterally, some rhonchi bilaterally. No rales. No use of accessory muscles of respiration.  CARDIOVASCULAR: S1, S2 normal. No murmurs, rubs, or gallops.  ABDOMEN: Soft, nontender, nondistended. Bowel sounds present. No organomegaly or mass.  EXTREMITIES: No cyanosis, clubbing or edema b/l.    NEUROLOGIC: Cranial nerves II through XII are intact. No focal Motor or sensory deficits b/l.   PSYCHIATRIC: The patient is alert and oriented x 3.   SKIN: No obvious rash, lesion, or ulcer.    LABORATORY PANEL:   CBC  Recent Labs Lab 03/29/15 1122  WBC 16.8*  HGB 12.9  HCT 37.1  PLT 267   ------------------------------------------------------------------------------------------------------------------  Chemistries   Recent Labs Lab 03/29/15 1122  NA 135  K 3.8  CL 97*  CO2 30  GLUCOSE 155*  BUN 12  CREATININE 0.58  CALCIUM 8.8*   ------------------------------------------------------------------------------------------------------------------  Cardiac Enzymes  Recent Labs Lab 03/29/15 1122  TROPONINI <0.03   ------------------------------------------------------------------------------------------------------------------  RADIOLOGY:  Dg Chest 2 View  03/29/2015  CLINICAL DATA:  Cough, congestion, fever for more than 1 week. Shortness of breath. EXAM: CHEST  2 VIEW COMPARISON:  03/23/2015 FINDINGS: Airspace opacities noted in both in the right middle lobe and lingula concerning for pneumonia. Mild peribronchial thickening. Heart is normal size. No effusions or  acute bony  abnormality. IMPRESSION: Bronchitic changes. Airspace opacities in the right middle lobe and lingula concerning for pneumonia. Electronically Signed   By: Rolm Baptise M.D.   On: 03/29/2015 11:59    ASSESSMENT AND PLAN:   60 year old female with past medical history of COPD, diabetes, osteoarthritis who presented with shortness of breath, cough and noted to have COPD Exacerbation due to pneumonia.   1. COPD Exacerbation - due to pneumonia.  - has some wheezing/bronchospams. Will start IV steroids, duonebs ATC, pulmicort nebs - cont. Levaquin - follow blood & sputum cultures.  - already on O2 at home.   2. Pneumonia - likely CAP.  - cont. Levaquin.  Follow sputum & blood cultures.   3. DM type II - cont. SSI for now.  - follow BS.  Hold Metformin.   4. Leukocytosis - due to pneumonia.  - follow w/ IV abx therapy.   All the records are reviewed and case discussed with Care Management/Social Workerr. Management plans discussed with the patient, family and they are in agreement.  CODE STATUS: Full Code  DVT Prophylaxis: Lovenox  TOTAL TIME TAKING CARE OF THIS PATIENT: 30 minutes.   POSSIBLE D/C IN 1-2 DAYS, DEPENDING ON CLINICAL CONDITION.   Henreitta Leber M.D on 03/30/2015 at 4:29 PM  Between 7am to 6pm - Pager - [phone]  After 6pm go to [email or web address] - password [email or web address] [facility]  Office  [phone]  CC: Primary care physician; Lelon Huh, MD

## 2015-03-30 NOTE — Progress Notes (Signed)
Inpatient Diabetes Program Recommendations  AACE/ADA: New Consensus Statement on Inpatient Glycemic Control (2015)  Target Ranges:  Prepandial:   less than 140 mg/dL      Peak postprandial:   less than 180 mg/dL (1-2 hours)      Critically ill patients:  140 - 180 mg/dL   Review of Glycemic Control  Results for KIRSTA, PROBERT (MRN [id number]) as of 03/30/2015 12:32  Ref. Range 03/29/2015 17:49 03/29/2015 22:38 03/30/2015 11:12  Glucose-Capillary Latest Ref Range: 65-99 mg/dL 189 (H) 152 (H) 242 (H)    Diabetes history: Type 2 Outpatient Diabetes medications: Metformin '500mg'$  bid, a1C 6.6% on 03/15/15 Current orders for Inpatient glycemic control: Novolog 0-9 units tid  Inpatient Diabetes Program Recommendations:  Please change diet to carb modified.  Gentry Fitz, RN, BA, MHA, CDE Diabetes Coordinator Inpatient Diabetes Program  [phone] (Team Pager) [phone] ([place]) 03/30/2015 12:34 PM

## 2015-03-31 LAB — EXPECTORATED SPUTUM ASSESSMENT W REFEX TO RESP CULTURE

## 2015-03-31 LAB — CBC
HCT: 35.8 % (ref 35.0–47.0)
Hemoglobin: 12.1 g/dL (ref 12.0–16.0)
MCH: 37.4 pg — AB (ref 26.0–34.0)
MCHC: 33.8 g/dL (ref 32.0–36.0)
MCV: 110.6 fL — ABNORMAL HIGH (ref 80.0–100.0)
PLATELETS: 258 10*3/uL (ref 150–440)
RBC: 3.24 MIL/uL — AB (ref 3.80–5.20)
RDW: 14.8 % — ABNORMAL HIGH (ref 11.5–14.5)
WBC: 9.6 10*3/uL (ref 3.6–11.0)

## 2015-03-31 LAB — EXPECTORATED SPUTUM ASSESSMENT W GRAM STAIN, RFLX TO RESP C

## 2015-03-31 LAB — GLUCOSE, CAPILLARY
GLUCOSE-CAPILLARY: 343 mg/dL — AB (ref 65–99)
Glucose-Capillary: 337 mg/dL — ABNORMAL HIGH (ref 65–99)

## 2015-03-31 MED ORDER — PREDNISONE 10 MG PO TABS: ORAL_TABLET | ORAL | Status: DC

## 2015-03-31 MED ORDER — LEVOFLOXACIN 500 MG PO TABS: 750.0000 mg | ORAL_TABLET | Freq: Every day | ORAL | Status: DC

## 2015-03-31 MED ORDER — LEVOFLOXACIN 750 MG PO TABS: 750.0000 mg | ORAL_TABLET | Freq: Every day | ORAL | Status: DC

## 2015-03-31 NOTE — Progress Notes (Signed)
PHARMACIST - PHYSICIAN COMMUNICATION DR:   Sainani CONCERNING: Antibiotic IV to Oral Route Change Policy  RECOMMENDATION: This patient is receiving levofloxacin by the intravenous route.  Based on criteria approved by the Pharmacy and Therapeutics Committee, the antibiotic(s) is/are being converted to the equivalent oral dose form(s).   DESCRIPTION: These criteria include:  Patient being treated for a respiratory tract infection, urinary tract infection, cellulitis or clostridium difficile associated diarrhea if on metronidazole  The patient is not neutropenic and does not exhibit a GI malabsorption state  The patient is eating (either orally or via tube) and/or has been taking other orally administered medications for a least 24 hours  The patient is improving clinically and has a Tmax < 100.5  If you have questions about this conversion, please contact the Pharmacy Department  []  ( [phone] )  [facility] [x]  ( [phone] )  [facility] []  ( [phone]  [facility] []  ( [phone] )  [facility] []  ( [phone] )  [facility]   

## 2015-03-31 NOTE — Progress Notes (Signed)
Pharmacy Antibiotic Note  Yvette Riley is a 60 y.o. female admitted on 03/29/2015 with pneumonia.  Pharmacy has been consulted for Vancomycin/Levaquin dosing. Pneumonia right-sided appears as part of complication due to recently treated influenza A  Plan: Vancomycin has been discontinued-MRSA PCR neg  Renal function stable and crcl >50. Will continue current order for Levaquin '750mg'$  po q24h for a total of 7 days per MD  Height: '4\' 11"'$  (149.9 cm) Weight: 172 lb (78.019 kg) IBW/kg (Calculated) : 43.2  Adj BW= 58.5 kg  Temp (24hrs), Avg:98.2 F (36.8 C), Min:97.8 F (36.6 C), Max:98.9 F (37.2 C)   Recent Labs Lab 03/29/15 1122 03/31/15 0646  WBC 16.8* 9.6  CREATININE 0.58  --     Estimated Creatinine Clearance: 69.1 mL/min (by C-G formula based on Cr of 0.58).    Allergies  Allergen Reactions  . Codeine Anaphylaxis  . Crestor [Rosuvastatin] Anaphylaxis  . Penicillins Anaphylaxis, Rash and Other (See Comments)    Reaction:  Tongue swelling  Has patient had a PCN reaction causing immediate rash, facial/tongue/throat swelling, SOB or lightheadedness with hypotension:  Yes   Has patient had a PCN reaction causing severe rash involving mucus membranes or skin necrosis: No Has patient had a PCN reaction that required hospitalization No Has patient had a PCN reaction occurring within the last 10 years: No If all of the above answers are "NO", then may proceed with Cephalosporin use.  . Yellow Jacket Venom [Bee Venom] Anaphylaxis  . Gemfibrozil Rash  . Lipitor [Atorvastatin] Rash    Antimicrobials this admission: Levaquin  3/1 >>3/7  Vancomycin 3/1 >>3/2   Dose adjustments this admission:   Microbiology results: 3/1 BCx: pending  UCx:   Sputum:   MRSA PCR  Thank you for allowing pharmacy to be a part of this patient's care.  Ramond Dial 03/31/2015 10:42 AM

## 2015-03-31 NOTE — Progress Notes (Signed)
SATURATION QUALIFICATIONS: (This note is used to comply with regulatory documentation for home oxygen)  Patient Saturations on Room Air at Rest = 92  Patient Saturations on Room Air while Ambulating = 91  Patient Saturations on  Please briefly explain why patient needs home oxygen: Patient uses PRN O2@ home

## 2015-03-31 NOTE — Discharge Summary (Signed)
[place] [facility] [place] NAME: Yvette Riley    MR#:  [id number]  DATE OF BIRTH:  05-May-1955  DATE OF ADMISSION:  03/29/2015 ADMITTING PHYSICIAN: Fritzi Mandes, MD  DATE OF DISCHARGE: 03/31/2015  2:36 PM  PRIMARY CARE PHYSICIAN: Lelon Huh, MD    ADMISSION DIAGNOSIS:  Dyspnea [R06.00] Hypoxia [R09.02] Healthcare-associated pneumonia [J18.9]  DISCHARGE DIAGNOSIS:  Active Problems:   Pneumonia   SECONDARY DIAGNOSIS:   Past Medical History  Diagnosis Date  . Displacement of lumbar intervertebral disc without myelopathy   . History of chicken pox   . COPD (chronic obstructive pulmonary disease) [facility])     HOSPITAL COURSE:   60 year old female with past medical history of COPD, diabetes, osteoarthritis who presented with shortness of breath, cough and noted to have COPD Exacerbation due to pneumonia.   1. COPD Exacerbation - this was due to pneumonia.  -Patient was treated with IV steroids, [facility], Pulmicort nebs. She has improved. -Now being discharged on oral Levaquin and oral prednisone taper. Patient is started on chronic oxygen at home. -She will continue her Xopenex and Flovent.  2. Pneumonia - likely CAP.  - She is being discharged on oral Levaquin.  3. DM type II - blood sugars were somewhat elevated due to IV steroids. Steroids are not being tapered. -She will continue metformin.  4. Leukocytosis - this was due to pneumonia and now has improved and resolved with antibiotics.   DISCHARGE CONDITIONS:   Stable  CONSULTS OBTAINED:     DRUG ALLERGIES:   Allergies  Allergen Reactions  . Codeine Anaphylaxis  . Crestor [Rosuvastatin] Anaphylaxis  . Penicillins Anaphylaxis, Rash and Other (See Comments)    Reaction:  Tongue swelling  Has patient had a PCN reaction causing immediate rash, facial/tongue/throat swelling, SOB or lightheadedness with hypotension:  Yes   Has patient had a PCN reaction causing  severe rash involving mucus membranes or skin necrosis: No Has patient had a PCN reaction that required hospitalization No Has patient had a PCN reaction occurring within the last 10 years: No If all of the above answers are "NO", then may proceed with Cephalosporin use.  . Yellow Jacket Venom [Bee Venom] Anaphylaxis  . Gemfibrozil Rash  . Lipitor [Atorvastatin] Rash    DISCHARGE MEDICATIONS:   Discharge Medication List as of 03/31/2015  1:33 PM    START taking these medications   Details  levofloxacin (LEVAQUIN) 750 MG tablet Take 1 tablet (750 mg total) by mouth daily after supper., Starting 03/31/2015, Until Discontinued, Print    predniSONE (DELTASONE) 10 MG tablet Label  & dispense according to the schedule below. 5 Pills PO for 1 day then, 4 Pills PO for 1 day, 3 Pills PO for 1 day, 2 Pills PO for 1 day, 1 Pill PO for 1 days then STOP., Print      CONTINUE these medications which have NOT CHANGED   Details  aspirin EC 81 MG tablet Take 81 mg by mouth at bedtime., Until Discontinued, Historical Med    fluticasone (FLOVENT HFA) 110 MCG/ACT inhaler Inhale 2 puffs into the lungs 2 (two) times daily as needed (for shortness of breath/wheezing)., Until Discontinued, Historical Med    HYDROcodone-acetaminophen (NORCO) 10-325 MG tablet Take 1 tablet by mouth every 4 (four) hours as needed for moderate pain., Until Discontinued, Historical Med    levalbuterol (XOPENEX HFA) 45 MCG/ACT inhaler Inhale 1-2 puffs into the lungs every 4 (four) hours as needed for wheezing  or shortness of breath., Until Discontinued, Historical Med    meloxicam (MOBIC) 15 MG tablet Take 15 mg by mouth daily as needed for pain., Until Discontinued, Historical Med    metFORMIN (GLUCOPHAGE) 500 MG tablet Take 500 mg by mouth 2 (two) times daily with a meal., Until Discontinued, Historical Med    zolpidem (AMBIEN) 10 MG tablet Take 10 mg by mouth at bedtime., Until Discontinued, Historical Med      STOP taking  these medications     metFORMIN (GLUCOPHAGE-XR) 500 MG 24 hr tablet      oseltamivir (TAMIFLU) 75 MG capsule      tiZANidine (ZANAFLEX) 4 MG tablet          DISCHARGE INSTRUCTIONS:   DIET:  Cardiac diet and Diabetic diet  DISCHARGE CONDITION:  Stable  ACTIVITY:  Activity as tolerated  OXYGEN:  Home Oxygen: Yes.     Oxygen Delivery: 2 liters/min via Patient connected to nasal cannula oxygen  DISCHARGE LOCATION:  home   If you experience worsening of your admission symptoms, develop shortness of breath, life threatening emergency, suicidal or homicidal thoughts you must seek medical attention immediately by calling 911 or calling your MD immediately  if symptoms less severe.  You Must read complete instructions/literature along with all the possible adverse reactions/side effects for all the Medicines you take and that have been prescribed to you. Take any new Medicines after you have completely understood and accpet all the possible adverse reactions/side effects.   Please note  You were cared for by a hospitalist during your hospital stay. If you have any questions about your discharge medications or the care you received while you were in the hospital after you are discharged, you can call the unit and asked to speak with the hospitalist on call if the hospitalist that took care of you is not available. Once you are discharged, your primary care physician will handle any further medical issues. Please note that NO REFILLS for any discharge medications will be authorized once you are discharged, as it is imperative that you return to your primary care physician (or establish a relationship with a primary care physician if you do not have one) for your aftercare needs so that they can reassess your need for medications and monitor your lab values.     Today   Shortness of breath, wheezing much improved. Wants to go home.  VITAL SIGNS:  Blood pressure 136/79, pulse 99,  temperature 98 F (36.7 C), temperature source Oral, resp. rate 18, height '4\' 11"'$  (1.499 m), weight 78.019 kg (172 lb), SpO2 94 %.  I/O:   Intake/Output Summary (Last 24 hours) at 03/31/15 1716 Last data filed at 03/31/15 0900  Gross per 24 hour  Intake   1146 ml  Output   1400 ml  Net   -254 ml    PHYSICAL EXAMINATION:   GENERAL: 60 y.o.-year-old patient lying in the bed in no acute distress.  EYES: Pupils equal, round, reactive to light and accommodation. No scleral icterus. Extraocular muscles intact.  HEENT: Head atraumatic, normocephalic. Oropharynx and nasopharynx clear.  NECK: Supple, no jugular venous distention. No thyroid enlargement, no tenderness.  LUNGS: Good A/E b/l, no wheezing, rhonchi No rales. No use of accessory muscles of respiration.  CARDIOVASCULAR: S1, S2 normal. No murmurs, rubs, or gallops.  ABDOMEN: Soft, nontender, nondistended. Bowel sounds present. No organomegaly or mass.  EXTREMITIES: No cyanosis, clubbing or edema b/l.  NEUROLOGIC: Cranial nerves II through XII are intact. No  focal Motor or sensory deficits b/l.  PSYCHIATRIC: The patient is alert and oriented x 3.  SKIN: No obvious rash, lesion, or ulcer.    DATA REVIEW:   CBC  Recent Labs Lab 03/31/15 0646  WBC 9.6  HGB 12.1  HCT 35.8  PLT 258    Chemistries   Recent Labs Lab 03/29/15 1122  NA 135  K 3.8  CL 97*  CO2 30  GLUCOSE 155*  BUN 12  CREATININE 0.58  CALCIUM 8.8*    Cardiac Enzymes  Recent Labs Lab 03/29/15 1122  TROPONINI <0.03    Microbiology Results  Results for orders placed or performed during the hospital encounter of 03/29/15  Culture, blood (routine x 2)     Status: None (Preliminary result)   Collection Time: 03/29/15  4:22 PM  Result Value Ref Range Status   Specimen Description BLOOD RIGHT ASSIST CONTROL  Final   Special Requests BOTTLES DRAWN AEROBIC AND ANAEROBIC 2CCAERO,2CCANA  Final   Culture NO GROWTH 2 DAYS  Final   Report  Status PENDING  Incomplete  Culture, blood (routine x 2)     Status: None (Preliminary result)   Collection Time: 03/29/15  4:22 PM  Result Value Ref Range Status   Specimen Description BLOOD RIGHT HAND  Final   Special Requests BOTTLES DRAWN AEROBIC AND ANAEROBIC [place]  Final   Culture NO GROWTH 2 DAYS  Final   Report Status PENDING  Incomplete  Culture, expectorated sputum-assessment     Status: None   Collection Time: 03/30/15  4:14 PM  Result Value Ref Range Status   Specimen Description SPUTUM  Final   Special Requests NONE  Final   Sputum evaluation THIS SPECIMEN IS ACCEPTABLE FOR SPUTUM CULTURE  Final   Report Status 03/31/2015 FINAL  Final  MRSA PCR Screening     Status: None   Collection Time: 03/30/15  4:15 PM  Result Value Ref Range Status   MRSA by PCR NEGATIVE NEGATIVE Final    Comment:        The GeneXpert MRSA Assay (FDA approved for NASAL specimens only), is one component of a comprehensive MRSA colonization surveillance program. It is not intended to diagnose MRSA infection nor to guide or monitor treatment for MRSA infections.     RADIOLOGY:  No results found.    Management plans discussed with the patient, family and they are in agreement.  CODE STATUS:     Code Status Orders        Start     Ordered   03/29/15 1940  Full code   Continuous     03/29/15 1939    Code Status History    Date Active Date Inactive Code Status Order ID Comments User Context   03/21/2015 11:56 PM 03/23/2015  4:46 PM Full Code [id number]  Lance Coon, MD Inpatient   06/12/2014  8:07 AM 06/12/2014  7:04 PM Full Code [id number]  Juluis Mire, MD Inpatient    Advance Directive Documentation        Most Recent Value   Type of Advance Directive  Living will   Pre-existing out of facility DNR order (yellow form or pink MOST form)     "MOST" Form in Place?        TOTAL TIME TAKING CARE OF THIS PATIENT: 40 minutes.    Henreitta Leber M.D on 03/31/2015 at 5:16  PM  Between 7am to 6pm - Pager - [phone]  After 6pm go to [email or web address] - password [email or web address]  [facility]  Office  [phone]  CC: Primary care physician; Lelon Huh, MD

## 2015-03-31 NOTE — Progress Notes (Signed)
Inpatient Diabetes Program Recommendations  AACE/ADA: New Consensus Statement on Inpatient Glycemic Control (2015)  Target Ranges:  Prepandial:   less than 140 mg/dL      Peak postprandial:   less than 180 mg/dL (1-2 hours)      Critically ill patients:  140 - 180 mg/dL   Review of Glycemic Control  Results for [name], Yvette Riley (MRN [id number]) as of 03/31/2015 14:09  Ref. Range 03/30/2015 11:12 03/30/2015 17:00 03/30/2015 21:32 03/31/2015 07:23 03/31/2015 11:34  Glucose-Capillary Latest Ref Range: 65-99 mg/dL 242 (H) 171 (H) 333 (H) 343 (H) 337 (H)    Diabetes history: Type 2 Outpatient Diabetes medications: Metformin '500mg'$  bid, a1C 6.6% on 03/15/15 Current orders for Inpatient glycemic control: Novolog 0-9 units tid  Inpatient Diabetes Program Recommendations:  Please consider adding Lantus insulin 16 units qhs (0.2units/kg). Consider increasing Novolog correction to resistant scale 0-20 units tid(while on steroids) and add Novolog correction 0-5 units qhs, Taper insulin when steroids are d/c'd.   Gentry Fitz, RN, BA, MHA, CDE Diabetes Coordinator Inpatient Diabetes Program  [phone] (Team Pager) [phone] ([place]) 03/31/2015 2:12 PM

## 2015-03-31 NOTE — Care Management Important Message (Signed)
Important Message  Patient Details  Name: Yvette Riley MRN: [id number] Date of Birth: 09-07-1955   Medicare Important Message Given:  Yes    Juliann Pulse A Jauna Raczynski 03/31/2015, 10:27 AM

## 2015-04-03 LAB — CULTURE, RESPIRATORY W GRAM STAIN: Culture: NORMAL

## 2015-04-03 LAB — CULTURE, RESPIRATORY

## 2015-04-03 LAB — CULTURE, BLOOD (ROUTINE X 2)
Culture: NO GROWTH
Culture: NO GROWTH

## 2015-04-06 ENCOUNTER — Ambulatory Visit
Admission: RE | Admit: 2015-04-06 | Discharge: 2015-04-06 | Disposition: A | Discharge status: Home / Self Care | Payer: Medicare Other | Source: Ambulatory Visit | Attending: Family Medicine | Admitting: Family Medicine

## 2015-04-06 ENCOUNTER — Encounter: Payer: Self-pay | Admitting: Family Medicine

## 2015-04-06 ENCOUNTER — Ambulatory Visit (INDEPENDENT_AMBULATORY_CARE_PROVIDER_SITE_OTHER): Payer: Medicare Other | Admitting: Family Medicine

## 2015-04-06 VITALS — BP 108/80 | HR 105 | Temp 97.8°F | Resp 20 | Wt 172.0 lb

## 2015-04-06 DIAGNOSIS — Z720 Tobacco use: Secondary | ICD-10-CM | POA: Diagnosis not present

## 2015-04-06 DIAGNOSIS — J449 Chronic obstructive pulmonary disease, unspecified: Secondary | ICD-10-CM

## 2015-04-06 DIAGNOSIS — J189 Pneumonia, unspecified organism: Secondary | ICD-10-CM | POA: Diagnosis not present

## 2015-04-06 MED ORDER — ALBUTEROL SULFATE (2.5 MG/3ML) 0.083% IN NEBU: 2.5000 mg | INHALATION_SOLUTION | Freq: Four times a day (QID) | RESPIRATORY_TRACT | Status: DC | PRN

## 2015-04-06 MED ORDER — FLUTICASONE PROPIONATE HFA 110 MCG/ACT IN AERO: 2.0000 | INHALATION_SPRAY | Freq: Two times a day (BID) | RESPIRATORY_TRACT | Status: DC | PRN

## 2015-04-06 MED ORDER — LEVALBUTEROL TARTRATE 45 MCG/ACT IN AERO: 1.0000 | INHALATION_SPRAY | RESPIRATORY_TRACT | Status: DC | PRN

## 2015-04-06 NOTE — Patient Instructions (Addendum)
Please stop smoking. Your life depends on it.   Go to the [facility] on [facility] for Chest Xray

## 2015-04-06 NOTE — Progress Notes (Signed)
Patient: Yvette Riley Female    DOB: 09/06/1955   60 y.o.   MRN: [id number] Visit Date: 04/06/2015  Today's Provider: Lelon Huh, MD   Chief Complaint  Patient presents with  . Hospitalization Follow-up  . Pneumonia   Subjective:    HPI   Follow up Hospitalization/ Pneumonia/ COPD Ecaxerbation  Patient was admitted to [facility] on 03/29/2015 and discharged on 03/31/2015. She was treated for Pneumonia and COPD exacerbation. Patient was discharged on oral Levaquin and oral Prednisone taper. Patient was to continue Xopenex and Flovent at home. Patient was also discharged on 2L 02 nasal canula.  She reports good compliance with treatment. She reports this condition is Improved. Still has shortness of breath.  Patient is still using 02 at home but reports she only uses it when she sleeps. Patient states her portable tanks are empty and the carrier will be coming to replace them soon. Patient states she has stopped smoking cigarettes since she became sick. Patient reports she is unable to sleep in her bed flat; she has to prop herself up with several pillows in order to breathe.  Had been hospitalized days earlier for influenza. She states she has a hard time inhaling from her HFA when she gets short of breath. -------------------------------------------------------------------------------- She states that she stopped smoking when she went into hospital and has not started again since discharge.      Allergies  Allergen Reactions  . Codeine Anaphylaxis  . Crestor [Rosuvastatin] Anaphylaxis  . Penicillins Anaphylaxis, Rash and Other (See Comments)    Reaction:  Tongue swelling  Has patient had a PCN reaction causing immediate rash, facial/tongue/throat swelling, SOB or lightheadedness with hypotension:  Yes   Has patient had a PCN reaction causing severe rash involving mucus membranes or skin necrosis: No Has patient had a PCN reaction that required hospitalization No Has  patient had a PCN reaction occurring within the last 10 years: No If all of the above answers are "NO", then may proceed with Cephalosporin use.  . Yellow Jacket Venom [Bee Venom] Anaphylaxis  . Gemfibrozil Rash  . Lipitor [Atorvastatin] Rash   Previous Medications   ASPIRIN EC 81 MG TABLET    Take 81 mg by mouth at bedtime.   FLUTICASONE (FLOVENT HFA) 110 MCG/ACT INHALER    Inhale 2 puffs into the lungs 2 (two) times daily as needed (for shortness of breath/wheezing).   HYDROCODONE-ACETAMINOPHEN (NORCO) 10-325 MG TABLET    Take 1 tablet by mouth every 4 (four) hours as needed for moderate pain.   LEVALBUTEROL (XOPENEX HFA) 45 MCG/ACT INHALER    Inhale 1-2 puffs into the lungs every 4 (four) hours as needed for wheezing or shortness of breath.   MELOXICAM (MOBIC) 15 MG TABLET    Take 15 mg by mouth daily as needed for pain.   METFORMIN (GLUCOPHAGE) 500 MG TABLET    Take 500 mg by mouth 2 (two) times daily with a meal.   ZOLPIDEM (AMBIEN) 10 MG TABLET    Take 10 mg by mouth at bedtime.    Review of Systems  Constitutional: Positive for diaphoresis. Negative for fever, chills, appetite change and fatigue.  HENT: Positive for congestion, rhinorrhea, sinus pressure and sneezing. Negative for ear pain and tinnitus.   Eyes: Negative for pain.  Respiratory: Positive for cough (productive yellow/ green phlegm), shortness of breath and wheezing. Negative for chest tightness.   Cardiovascular: Negative for chest pain and palpitations.  Gastrointestinal: Negative for nausea,  vomiting and abdominal pain.  Neurological: Positive for headaches. Negative for dizziness and weakness.    Social History  Substance Use Topics  . Smoking status: Current Every Day Smoker -- 1.00 packs/day    Types: Cigarettes  . Smokeless tobacco: Not on file  . Alcohol Use: No   Objective:   BP 108/80 mmHg  Pulse 105  Temp(Src) 97.8 F (36.6 C) (Oral)  Resp 20  Wt 172 lb (78.019 kg)  SpO2 94%  SpO2=94% on room  air at rest          = 93% on room air with 5 minutes exertion Physical Exam   General Appearance:    Alert, cooperative, no distress  Eyes:    PERRL, conjunctiva/corneas clear, EOM's intact       Lungs:     Diffuse moderate expiratory wheezes and rhonchi. No rales. respirations unlabored  Heart:    Regular rate and rhythm  Neurologic:   Awake, alert, oriented x 3. No apparent focal neurological           defect.           Assessment & Plan:     1. Chronic obstructive pulmonary disease, unspecified COPD type ([place])  - DG Chest 2 View; Future - fluticasone (FLOVENT HFA) 110 MCG/ACT inhaler; Inhale 2 puffs into the lungs 2 (two) times daily as needed (for shortness of breath/wheezing).  Dispense: 1 Inhaler; Refill: 12 - levalbuterol (XOPENEX HFA) 45 MCG/ACT inhaler; Inhale 1-2 puffs into the lungs every 4 (four) hours as needed for wheezing or shortness of breath.  Dispense: 1 Inhaler; Refill: 12  She feels strongly that she needs supplemental oxygen, but she currently does not qualify for O2 at rest or upon exertion. She had o/n oximetry in September and did not qualify. She may find more relief with nebulizer which she was given a prescription for today. She is to get back on Flovent. Consider adding LAMA  2. CAP (community acquired pneumonia) Symptomatically improved. Chest Xr to confirm resolution - DG Chest 2 View; Future  3. Tobacco use Counseled to stop smoking  Addressed multiple complicated chronic medical conditions requiring extensive time in counseling and coordination care.  Over half of this 45 minute visit were spent in counseling and coordinating care of these medical problems.        Lelon Huh, MD  [facility]

## 2015-04-07 ENCOUNTER — Telehealth: Payer: Self-pay | Admitting: *Deleted

## 2015-04-07 MED ORDER — DOXYCYCLINE HYCLATE 100 MG PO CAPS: 100.0000 mg | ORAL_CAPSULE | Freq: Two times a day (BID) | ORAL | Status: DC

## 2015-04-07 NOTE — Telephone Encounter (Signed)
-----   Message from Birdie Sons, MD sent at 04/07/2015  8:12 AM EST ----- Xray shows there is still some bronchitis. Recommend she start on Doxycycline '100mg'$  one twice a day for 10 days.

## 2015-04-07 NOTE — Telephone Encounter (Signed)
Patient was notified of results. Patient expressed understanding. Rx was sent into pharmacy.

## 2015-05-02 ENCOUNTER — Other Ambulatory Visit: Payer: Self-pay | Admitting: Family Medicine

## 2015-05-02 NOTE — Telephone Encounter (Signed)
Was just filled on 04-30-2015. She needs to call 1 week before her prescription runs out before we can print new prescription.

## 2015-05-02 NOTE — Telephone Encounter (Signed)
Please advise message below  °

## 2015-05-02 NOTE — Telephone Encounter (Signed)
Pt contacted office for refill request on the following medications: HYDROcodone-acetaminophen (NORCO/VICODIN) 10-325 mg pt stated that she gets 3 months at a time and she wanted to know when she needs to have a f/u OV. Please advise. Thanks TNP

## 2015-05-03 NOTE — Telephone Encounter (Signed)
LMTCB

## 2015-05-04 NOTE — Telephone Encounter (Signed)
Advised patient as below.  

## 2015-05-08 ENCOUNTER — Other Ambulatory Visit: Payer: Self-pay | Admitting: *Deleted

## 2015-05-08 NOTE — Patient Outreach (Signed)
[facility]) Care Management  05/08/2015  MARASIA NEWHALL 1955-04-10 [id number]  Subjective: Telephone call to patient's home number, no answer, left HIPAA compliant voice mail message, and requested call back.  Objective: Per chart review: Patient hospitalized 03/29/15 - 03/31/15 for COPD exacerbation and pneumonia.   Patient hospitalized 03/21/15 - 03/23/15 for sepsis.   Patient also has a history of diabetes and hypertension.   Patient reports wearing home oxygen at night as needed and has stopped smoking since hospitalization.   Patient's last office visit with primary MD Dr. Alan Mulder was on 04/06/15.   Assessment: Received February Readmit list referral on 05/04/15.  2 Admissions in past 6 months, 6 days in between admissions.   Diagnosis: COPD.    Telephone screen completion, pending patient contact.  Plan: RNCM will call patient within 1 week for 2nd attempt telephone outreach, if no return call.  Barri Neidlinger H. Annia Friendly, BSN, [place] Management [facility] Telephonic CM Phone: [phone] Fax: [phone]

## 2015-05-09 ENCOUNTER — Other Ambulatory Visit: Payer: Self-pay | Admitting: *Deleted

## 2015-05-09 NOTE — Patient Outreach (Signed)
[facility]) Care Management  05/09/2015  Yvette Riley 06-11-1955 [id number]  Subjective: Telephone call to patient's home number, no answer, left HIPAA compliant voice mail message, and requested call back.  Objective: Per chart review: Patient hospitalized 03/29/15 - 03/31/15 for COPD exacerbation and pneumonia. Patient hospitalized 03/21/15 - 03/23/15 for sepsis. Patient also has a history of diabetes and hypertension. Patient reports wearing home oxygen at night as needed and has stopped smoking since hospitalization. Patient's last office visit with primary MD Dr. Alan Mulder was on 04/06/15.   Assessment: Received February Readmit list referral on 05/04/15. 2 Admissions in past 6 months, 6 days in between admissions. Diagnosis: COPD. Telephone screen completion, pending patient contact.  Plan: RNCM will call patient within 1 week for 3rd attempt telephone outreach, if no return call.  Bernardine Langworthy H. Annia Friendly, BSN, [place] Management [facility] Telephonic CM Phone: [phone] Fax: [phone]

## 2015-05-10 ENCOUNTER — Encounter: Payer: Self-pay | Admitting: *Deleted

## 2015-05-10 ENCOUNTER — Other Ambulatory Visit: Payer: Self-pay | Admitting: *Deleted

## 2015-05-10 NOTE — Patient Outreach (Addendum)
Yvette [facility]) Care Management  05/10/2015  Yvette Riley [birth year] [id number]  Subjective: Telephone to patient's home number, spoke with patient, and HIPAA verified.   Patient states she is currently at [facility] in ED and may be admitted to the [facility].   States the ED provider is currently deciding if she will be admitted.  Discussed [facility] Care Management services,  patient states she is interested, and is in agreement to receive [place] Management information for review or speaking with [facility] if admitted.   Patient states if she is not admitted will contact RNCM regarding program enrollment, within the next week.  Left HIPAA compliant voicemail message for Yvette Riley Riley and requested call back. Received HIPAA compliant voicemail message from Yvette Riley, requesting RNCM  call back. Telephone call to [place] [facility], HIPAA verified, and advised of RNCM's conversation with patient.  Yvette Riley states she will follow up with patient today.  Objective:  Per chart review: Patient hospitalized 03/29/15 - 03/31/15 for COPD exacerbation and pneumonia. Patient hospitalized 03/21/15 - 03/23/15 for sepsis. Patient also has a history of diabetes and hypertension. Patient reports wearing home oxygen at night as needed and has stopped smoking since hospitalization. Patient's last office visit with primary MD Dr. Alan Riley was on 04/06/15.   Assessment: Received February Readmit list referral on 05/04/15. 2 Admissions in past 6 months, 6 days in between admissions. Diagnosis: COPD. Telephone screen completion, pending patient contact.  Plan: RNCM will send referral to [place] ([address] [place], [place], and [place]ividad Brood) to follow up with patient if admitted regarding engagement in [place] Management services. RNCM will send patient successful outreach  letter, Yvette Riley pamphlet, and magnet. RNCM will follow up within 1 week to verify patient's admission status and notify Yvette [facility] at [place] [facility] of patient's appropriate program status, if appropriate.  RNCM will proceed with case closure process if patient not admitted and no return call from patient within 10 business days.   Yvette Riley, BSN, [place] Management [facility] Telephonic CM Phone: [phone] Fax: [phone]

## 2015-05-11 ENCOUNTER — Other Ambulatory Visit: Payer: Self-pay | Admitting: *Deleted

## 2015-05-11 NOTE — Patient Outreach (Signed)
[facility]) Care Management  05/11/2015  TEIRRA CARAPIA 15-Nov-1955 [id number]  Received in-basket message from [facility] Liaison [name]), patient was not admitted to the hospital on 05/10/15 and does not have an ED encounter for 05/10/15.   Objective: Per chart review: Patient hospitalized 03/29/15 - 03/31/15 for COPD exacerbation and pneumonia. Patient hospitalized 03/21/15 - 03/23/15 for sepsis. Patient also has a history of diabetes and hypertension. Patient reports wearing home oxygen at night as needed and has stopped smoking since hospitalization. Patient's last office visit with primary MD Dr. Alan Mulder was on 04/06/15.   Assessment: Received February Readmit list referral on 05/04/15. 2 Admissions in past 6 months, 6 days in between admissions. Diagnosis: COPD. Telephone screen completion, pending patient contact.  Plan: RNCM will follow up within 1 week to verify patient's ED location for 05/10/15 visit , if no return call from patient. RNCM will proceed with case closure process if no return call from patient within 10 business days.   Laruth Hanger H. Annia Friendly, BSN, [place] Management [facility] Telephonic CM Phone: [phone] Fax: [phone]

## 2015-05-16 ENCOUNTER — Other Ambulatory Visit: Payer: Self-pay | Admitting: *Deleted

## 2015-05-16 NOTE — Patient Outreach (Signed)
[facility]) Care Management  05/16/2015  Yvette Riley 02-Aug-1955 [id number]  Subjective: Telephone call to patient's home number, no answer, left HIPAA compliant voice mail message, and requested call back.  Objective: Per chart review: Patient hospitalized 03/29/15 - 03/31/15 for COPD exacerbation and pneumonia. Patient hospitalized 03/21/15 - 03/23/15 for sepsis. Patient also has a history of diabetes and hypertension. Patient reports wearing home oxygen at night as needed and has stopped smoking since hospitalization. Patient's last office visit with primary MD Dr. Alan Mulder was on 04/06/15.   Assessment: Received February Readmit list referral on 05/04/15. 2 Admissions in past 6 months, 6 days in between admissions. Diagnosis: COPD. Telephone screen completion, pending patient contact.  Plan: RNCM will follow up within 1 week to verify patient's ED location for 05/10/15 visit , if no return call from patient. RNCM will proceed with case closure process if no return call from patient within 10 business days.   Yvette Riley, BSN, [place] [facility] Telephonic CM Phone: [phone] Fax: [phone]

## 2015-05-17 ENCOUNTER — Other Ambulatory Visit: Payer: Self-pay | Admitting: *Deleted

## 2015-05-17 NOTE — Patient Outreach (Signed)
[facility]) Care Management  05/17/2015  Yvette Riley May 19, 1955 [id number]  Subjective: Telephone call to patient's home number, spoke with patient's sister, left HIPAA compliant message with sister for patient, and requested call back.  Objective: Per chart review: Patient hospitalized 03/29/15 - 03/31/15 for COPD exacerbation and pneumonia. Patient hospitalized 03/21/15 - 03/23/15 for sepsis. Patient also has a history of diabetes and hypertension. Patient reports wearing home oxygen at night as needed and has stopped smoking since hospitalization. Patient's last office visit with primary MD Dr. Alan Mulder was on 04/06/15.   Assessment: Received February Readmit list referral on 05/04/15. 2 Admissions in past 6 months, 6 days in between admissions. Diagnosis: COPD. Telephone screen completion, pending patient contact.  Plan: RNCM will follow up for 3rd outreach attempt, within 1 week to verify patient's ED location for 05/10/15 visit , complete telephone screen, if no return call from patient. RNCM will proceed with case closure process if no return call from patient within 10 business days.   Yvette Riley H. Annia Friendly, BSN, [place] [facility] Telephonic CM Phone: [phone] Fax: [phone]

## 2015-05-18 ENCOUNTER — Ambulatory Visit: Payer: Self-pay | Admitting: *Deleted

## 2015-05-19 ENCOUNTER — Other Ambulatory Visit: Payer: Self-pay | Admitting: *Deleted

## 2015-05-19 ENCOUNTER — Telehealth: Payer: Self-pay

## 2015-05-19 NOTE — Patient Outreach (Signed)
[facility]) Care Management  05/19/2015  Yvette Riley 02/28/1955 [id number]  Subjective: Telephone call to patient's home number, spoke with patient's sister, left HIPAA compliant message with sister for patient, and requested call back.  Objective: Per chart review: Patient hospitalized 03/29/15 - 03/31/15 for COPD exacerbation and pneumonia. Patient hospitalized 03/21/15 - 03/23/15 for sepsis. Patient also has a history of diabetes and hypertension. Patient reports wearing home oxygen at night as needed and has stopped smoking since hospitalization. Patient's last office visit with primary MD Dr. Alan Mulder was on 04/06/15.   Assessment: Received February Readmit list referral on 05/04/15. 2 Admissions in past 6 months, 6 days in between admissions. Diagnosis: COPD. Telephone screen completion, pending patient contact.  Patient has been sent [place] [facility] program outreach materials and has not responded.   Plan: RNCM will proceed with case closure process if no return call from patient within 10 business days.   Tayloranne Lekas H. Annia Friendly, BSN, [place] Management [facility] Telephonic CM Phone: [phone] Fax: [phone]

## 2015-05-19 NOTE — Telephone Encounter (Signed)
Patient called and states the past 2 months she has noticed that she is running out of Norco like the last week of the month and refill is not due till the 3rd of each month. Patient states that she usually takes 1 tablet every 5 hours but some days takes more, states her pain has gotten worse and will have to take extra tablet at times. She wanted to see if we can increase quantity of the medication or what can she do? She states that she did not have this problem until the past 2 months, she noticed that she is running out of medication earlier. She states sometimes she will take it and 2 hours later she is hurting really bad again and states this did not happen to her before.-aa

## 2015-05-23 NOTE — Telephone Encounter (Signed)
Advised patient as below.  

## 2015-05-23 NOTE — Telephone Encounter (Signed)
Pt contacted office for refill request on the following medications: HYDROcodone-acetaminophen (NORCO) 10-325 MG tablet. Pt would like to get 3 months of RX slips. Please advise. Thanks TNP

## 2015-05-23 NOTE — Telephone Encounter (Signed)
Ok to refill for patient? Please advise. Thanks!

## 2015-05-23 NOTE — Telephone Encounter (Signed)
The most that can be prescribed is 180 tablets in a month, which is 6 tablets per day. She had180 tablets dispensed on 4/2 and cannot get another prescription until May 2nd. If this is not controlling pain, then she needs referral to pain clinic.

## 2015-05-24 ENCOUNTER — Other Ambulatory Visit: Payer: Self-pay | Admitting: *Deleted

## 2015-05-24 ENCOUNTER — Encounter: Payer: Self-pay | Admitting: *Deleted

## 2015-05-24 NOTE — Patient Outreach (Signed)
[facility]) Care Management  05/24/2015  Yvette Riley [birth year] [id number]  No response from patient outreach attempts, will proceed with case closure.  Subjective: Telephone call to patient's primary MD's office (Dr. Lelon Huh), no answer, left HIPAA compliant voice mail message, and requested call back.  Objective: Per chart review: Patient hospitalized 03/29/15 - 03/31/15 for COPD exacerbation and pneumonia. Patient hospitalized 03/21/15 - 03/23/15 for sepsis. Patient also has a history of diabetes and hypertension. Patient reports wearing home oxygen at night as needed and has stopped smoking since hospitalization. Patient's last office visit with primary MD Dr. Alan Mulder was on 04/06/15.   Assessment: Received February Readmit list referral on 05/04/15. 2 Admissions in past 6 months, 6 days in between admissions. Diagnosis: COPD. Patient has been sent [place] Management program outreach materials and has not responded, will proceed with case closure.   Plan: RNCM will send patient's primary MD case closure letter due to unable to reach. RNCM will send case closure due to unable to reach request to Yvette Riley at [facility].      Yvette Riley H. Annia Friendly, BSN, [place] Management [facility] Telephonic CM Phone: [phone] Fax: [phone]

## 2015-05-29 ENCOUNTER — Telehealth: Payer: Self-pay | Admitting: Family Medicine

## 2015-05-29 ENCOUNTER — Other Ambulatory Visit: Payer: Self-pay | Admitting: *Deleted

## 2015-05-30 MED ORDER — HYDROCODONE-ACETAMINOPHEN 10-325 MG PO TABS: 1.0000 | ORAL_TABLET | ORAL | Status: DC | PRN

## 2015-06-07 ENCOUNTER — Other Ambulatory Visit: Payer: Self-pay | Admitting: Family Medicine

## 2015-06-07 MED ORDER — GLUCOSE BLOOD VI STRP: ORAL_STRIP | Status: DC

## 2015-06-09 ENCOUNTER — Ambulatory Visit (INDEPENDENT_AMBULATORY_CARE_PROVIDER_SITE_OTHER): Payer: Medicare Other | Admitting: Family Medicine

## 2015-06-09 ENCOUNTER — Encounter: Payer: Self-pay | Admitting: Family Medicine

## 2015-06-09 VITALS — BP 110/74 | HR 92 | Temp 98.5°F | Resp 16 | Wt 177.0 lb

## 2015-06-09 DIAGNOSIS — J029 Acute pharyngitis, unspecified: Secondary | ICD-10-CM | POA: Diagnosis not present

## 2015-06-09 DIAGNOSIS — H109 Unspecified conjunctivitis: Secondary | ICD-10-CM

## 2015-06-09 DIAGNOSIS — J4520 Mild intermittent asthma, uncomplicated: Secondary | ICD-10-CM | POA: Diagnosis not present

## 2015-06-09 LAB — POCT RAPID STREP A (OFFICE): RAPID STREP A SCREEN: NEGATIVE

## 2015-06-09 MED ORDER — NEOMYCIN-POLYMYXIN-HC 3.5-10000-1 OP SUSP: 2.0000 [drp] | Freq: Four times a day (QID) | OPHTHALMIC | Status: DC

## 2015-06-09 MED ORDER — CLARITHROMYCIN 500 MG PO TABS: 500.0000 mg | ORAL_TABLET | Freq: Two times a day (BID) | ORAL | Status: DC

## 2015-06-09 NOTE — Progress Notes (Signed)
Patient ID: Yvette Riley, female   DOB: Jun 21, 1955, 60 y.o.   MRN: [id number]       Patient: Yvette Riley Female    DOB: 04-Sep-1955   60 y.o.   MRN: [id number] Visit Date: 06/09/2015  Today's Provider: Vernie Murders, PA   Chief Complaint  Patient presents with  . Sore Throat  . Conjunctivitis   Subjective:    Sore Throat  This is a new problem. The current episode started in the past 7 days. The problem has been gradually worsening. There has been no fever. The pain is moderate. Associated symptoms include congestion and trouble swallowing. Pertinent negatives include no coughing or ear pain. Associated symptoms comments: Swollen sensation in throat and difficulty swallowing.. She has had no exposure to strep or mono. She has tried acetaminophen for the symptoms. The treatment provided no relief.  Conjunctivitis  The current episode started 3 to 5 days ago. The onset was sudden. The problem has been gradually worsening. The problem is moderate. Nothing relieves the symptoms. Associated symptoms include congestion, sore throat, eye discharge and eye redness. Pertinent negatives include no fever, no ear pain and no cough. The eye pain is mild. The left eye is affected.   Past Medical History  Diagnosis Date  . Displacement of lumbar intervertebral disc without myelopathy   . History of chicken pox   . COPD (chronic obstructive pulmonary disease) [facility])    Past Surgical History  Procedure Laterality Date  . Cesarean section    . Tubal ligation     Family History  Problem Relation Age of Onset  . Heart failure Mother   . Heart disease Mother   . Stroke Mother   . Heart disease Brother   . COPD Brother   . Cancer Maternal Aunt     Breast Cancer   Allergies  Allergen Reactions  . Codeine Anaphylaxis  . Crestor [Rosuvastatin] Anaphylaxis  . Penicillins Anaphylaxis, Rash and Other (See Comments)    Reaction:  Tongue swelling  Has patient had a PCN reaction causing  immediate rash, facial/tongue/throat swelling, SOB or lightheadedness with hypotension:  Yes   Has patient had a PCN reaction causing severe rash involving mucus membranes or skin necrosis: No Has patient had a PCN reaction that required hospitalization No Has patient had a PCN reaction occurring within the last 10 years: No If all of the above answers are "NO", then may proceed with Cephalosporin use.  . Yellow Jacket Venom [Bee Venom] Anaphylaxis  . Gemfibrozil Rash  . Lipitor [Atorvastatin] Rash   Previous Medications   ALBUTEROL (PROVENTIL) (2.5 MG/3ML) 0.083% NEBULIZER SOLUTION    Take 3 mLs (2.5 mg total) by nebulization every 6 (six) hours as needed for wheezing or shortness of breath.   ASPIRIN EC 81 MG TABLET    Take 81 mg by mouth at bedtime.   DOXYCYCLINE (VIBRAMYCIN) 100 MG CAPSULE    Take 1 capsule (100 mg total) by mouth 2 (two) times daily.   FLUTICASONE (FLOVENT HFA) 110 MCG/ACT INHALER    Inhale 2 puffs into the lungs 2 (two) times daily as needed (for shortness of breath/wheezing).   GLUCOSE BLOOD TEST STRIP    Check blood sugar daily. Dx Type 2 diabetes E11.9   HYDROCODONE-ACETAMINOPHEN (NORCO) 10-325 MG TABLET    Take 1 tablet by mouth every 4 (four) hours as needed for moderate pain.   LEVALBUTEROL (XOPENEX HFA) 45 MCG/ACT INHALER    Inhale 1-2 puffs into the lungs every 4 (  four) hours as needed for wheezing or shortness of breath.   MELOXICAM (MOBIC) 15 MG TABLET    Take 15 mg by mouth daily as needed for pain.   METFORMIN (GLUCOPHAGE) 500 MG TABLET    Take 500 mg by mouth 2 (two) times daily with a meal.   ZOLPIDEM (AMBIEN) 10 MG TABLET    Take 10 mg by mouth at bedtime.    Review of Systems  Constitutional: Negative for fever.  HENT: Positive for congestion, sore throat and trouble swallowing. Negative for ear pain.   Eyes: Positive for discharge and redness.  Respiratory: Negative for cough.     Social History  Substance Use Topics  . Smoking status: Current  Every Day Smoker -- 1.00 packs/day    Types: Cigarettes  . Smokeless tobacco: Not on file  . Alcohol Use: No   Objective:   BP 110/74 mmHg  Pulse 92  Temp(Src) 98.5 F (36.9 C)  Resp 16  Wt 177 lb (80.287 kg)  SpO2 94%  Physical Exam  Constitutional: She is oriented to person, place, and time. She appears well-developed and well-nourished. No distress.  HENT:  Head: Normocephalic and atraumatic.  Right Ear: Hearing normal.  Left Ear: Hearing normal.  Nose: Nose normal.  Eyes: Lids are normal. Right eye exhibits no discharge. Left eye exhibits discharge. No scleral icterus.  Hyperemic palpebral and bulbar conjunctiva with watery discharge from the left eye. Slight tearing from the right.  Neck: Neck supple.  Cardiovascular: Normal rate and regular rhythm.   Pulmonary/Chest: Effort normal. No respiratory distress. She has wheezes. She has no rales.  Neurological: She is alert and oriented to person, place, and time.  Skin: Skin is intact. No lesion and no rash noted.  Psychiatric: She has a normal mood and affect. Her speech is normal and behavior is normal. Thought content normal.      Assessment & Plan:     1. Pharyngitis Onset over the past week. Feeling sore and swollen. Strep test negative today. No fever but some wheeze and congestion. Will treat with antibiotic and may add saltwater gargles or throat spray. Recheck prn. - POCT rapid strep A  2. Conjunctivitis of left eye Onset 3 days ago with irritation, redness and drainage. Will treat with antibiotic drops and warm compresses. If no better in 3-4 days, should follow up with eye doctor. - neomycin-polymyxin-hydrocortisone (CORTISPORIN) 3.5-10000-1 ophthalmic suspension; Place 2 drops into the left eye 4 (four) times daily.  Dispense: 7.5 mL; Refill: 0  3. Asthmatic bronchitis, mild intermittent, uncomplicated Wheezing and slight rhonchi to auscultation today. Proceed with antibiotic and add Mucincex-DM with asthma  inhalers. Increase fluid intake and recheck if no better in a week. - clarithromycin (BIAXIN) 500 MG tablet; Take 1 tablet (500 mg total) by mouth 2 (two) times daily.  Dispense: 20 tablet; Refill: [place]  [facility]

## 2015-06-09 NOTE — Patient Instructions (Signed)

## 2015-06-23 ENCOUNTER — Other Ambulatory Visit: Payer: Self-pay | Admitting: Family Medicine

## 2015-06-23 MED ORDER — HYDROCODONE-ACETAMINOPHEN 10-325 MG PO TABS: 1.0000 | ORAL_TABLET | ORAL | Status: DC | PRN

## 2015-06-23 NOTE — Telephone Encounter (Signed)
Pt called back about this again-aa

## 2015-06-23 NOTE — Telephone Encounter (Signed)
Pt is requesting refill on HYDROcodone-acetaminophen (NORCO) 10-325 MG tablet.

## 2015-07-12 ENCOUNTER — Other Ambulatory Visit: Payer: Self-pay | Admitting: *Deleted

## 2015-07-12 MED ORDER — METFORMIN HCL 500 MG PO TABS: 1000.0000 mg | ORAL_TABLET | Freq: Two times a day (BID) | ORAL | Status: DC

## 2015-07-13 ENCOUNTER — Encounter: Payer: Self-pay | Admitting: Family Medicine

## 2015-07-13 ENCOUNTER — Ambulatory Visit (INDEPENDENT_AMBULATORY_CARE_PROVIDER_SITE_OTHER): Payer: Medicare Other | Admitting: Family Medicine

## 2015-07-13 VITALS — BP 112/74 | HR 105 | Temp 98.3°F | Resp 24 | Wt 175.0 lb

## 2015-07-13 DIAGNOSIS — G4710 Hypersomnia, unspecified: Secondary | ICD-10-CM | POA: Diagnosis not present

## 2015-07-13 DIAGNOSIS — E119 Type 2 diabetes mellitus without complications: Secondary | ICD-10-CM

## 2015-07-13 DIAGNOSIS — J449 Chronic obstructive pulmonary disease, unspecified: Secondary | ICD-10-CM

## 2015-07-13 DIAGNOSIS — J441 Chronic obstructive pulmonary disease with (acute) exacerbation: Secondary | ICD-10-CM

## 2015-07-13 DIAGNOSIS — J40 Bronchitis, not specified as acute or chronic: Secondary | ICD-10-CM | POA: Diagnosis not present

## 2015-07-13 LAB — POCT GLYCOSYLATED HEMOGLOBIN (HGB A1C)
ESTIMATED AVERAGE GLUCOSE: 154
HEMOGLOBIN A1C: 7

## 2015-07-13 MED ORDER — PREDNISONE 10 MG PO TABS: ORAL_TABLET | ORAL | Status: AC

## 2015-07-13 MED ORDER — LEVOFLOXACIN 500 MG PO TABS: 500.0000 mg | ORAL_TABLET | Freq: Every day | ORAL | Status: AC

## 2015-07-13 NOTE — Progress Notes (Signed)
Patient: Yvette Riley Female    DOB: [birth year]   60 y.o.   MRN: [id number] Visit Date: 07/13/2015  Today's Provider: Lelon Huh, MD   Chief Complaint  Patient presents with  . Hypertension    follow up  . Diabetes    follow up  . COPD    follow up   Subjective:    HPI  Hypertension, follow-up:  BP Readings from Last 3 Encounters:  06/09/15 110/74  04/06/15 108/80  03/31/15 136/79    She was last seen for hypertension 4 months ago.  BP at that visit was 106/70. Management since that visit includes no changes. She reports good compliance with treatment. She is not having side effects.  She is not exercising. She is adherent to low salt diet.   Outside blood pressures are not being checked. She is experiencing dyspnea and fatigue.  Patient denies chest pain, claudication, exertional chest pressure/discomfort, irregular heart beat, palpitations, syncope and tachypnea.   Cardiovascular risk factors include diabetes mellitus and hypertension.  Use of agents associated with hypertension: NSAIDS.     Weight trend: fluctuating a bit Wt Readings from Last 3 Encounters:  06/09/15 177 lb (80.287 kg)  04/06/15 172 lb (78.019 kg)  03/29/15 172 lb (78.019 kg)    Current diet: well balanced  ------------------------------------------------------------------------   Diabetes Mellitus Type II, Follow-up:   Lab Results  Component Value Date   HGBA1C 6.5 03/15/2015   HGBA1C 6.1 09/30/2014   HGBA1C 7.2* 02/24/2014    Last seen for diabetes 4 months ago.  Management since then includes no change. She reports good compliance with treatment. She is not having side effects.  Current symptoms include none and have been stable. Home blood sugar records: fasting range: 120  Episodes of hypoglycemia? no   Current Insulin Regimen: none Most Recent Eye Exam: 1 year ago Weight trend: fluctuating a bit Prior visit with dietician: no Current diet: well  balanced Current exercise: none  Pertinent Labs:    Component Value Date/Time   CREATININE 0.58 03/29/2015 1122   CREATININE 0.68 02/02/2012 1114    Wt Readings from Last 3 Encounters:  06/09/15 177 lb (80.287 kg)  04/06/15 172 lb (78.019 kg)  03/29/15 172 lb (78.019 kg)    ------------------------------------------------------------------------  Follow up COPD:  Lat office visit was 3 months ago. Management during that visit includes giving patient prescription for nebulizer treatment.  Patient was not able to start using the Nebulizer treatment because her insurance didn't cover that medication.   Cough:  Patient has had a productive cough for 2 days. Symptoms have been worsening. Associated symptoms also includes runny nose, headache, shortness of breath, sneezing, wheezing, fever, chills, watery eyes and sinus pressure. Patient has been taking Theraflu which has helped improve her headache.   She also reports she feels extremely sleep during the day and will easily fall asleep when sedentary for more than a few minutes.     Allergies  Allergen Reactions  . Codeine Anaphylaxis  . Crestor [Rosuvastatin] Anaphylaxis  . Penicillins Anaphylaxis, Rash and Other (See Comments)    Reaction:  Tongue swelling  Has patient had a PCN reaction causing immediate rash, facial/tongue/throat swelling, SOB or lightheadedness with hypotension:  Yes   Has patient had a PCN reaction causing severe rash involving mucus membranes or skin necrosis: No Has patient had a PCN reaction that required hospitalization No Has patient had a PCN reaction occurring within the last 10 years: No  If all of the above answers are "NO", then may proceed with Cephalosporin use.  . Yellow Jacket Venom [Bee Venom] Anaphylaxis  . Gemfibrozil Rash  . Lipitor [Atorvastatin] Rash   Current Meds  Medication Sig  . aspirin EC 81 MG tablet Take 81 mg by mouth at bedtime.  [name] glucose blood test strip Check blood sugar  daily. Dx Type 2 diabetes E11.9  . HYDROcodone-acetaminophen (NORCO) 10-325 MG tablet Take 1 tablet by mouth every 4 (four) hours as needed for moderate pain.  [name] levalbuterol (XOPENEX HFA) 45 MCG/ACT inhaler Inhale 1-2 puffs into the lungs every 4 (four) hours as needed for wheezing or shortness of breath.  . meloxicam (MOBIC) 15 MG tablet Take 15 mg by mouth daily as needed for pain.  . metFORMIN (GLUCOPHAGE) 500 MG tablet Take 2 tablets (1,000 mg total) by mouth 2 (two) times daily with a meal.  . neomycin-polymyxin-hydrocortisone (CORTISPORIN) 3.5-10000-1 ophthalmic suspension Place 2 drops into the left eye 4 (four) times daily.  [name] zolpidem (AMBIEN) 10 MG tablet Take 10 mg by mouth at bedtime.  . [DISCONTINUED] clarithromycin (BIAXIN) 500 MG tablet Take 1 tablet (500 mg total) by mouth 2 (two) times daily.    Review of Systems  Constitutional: Positive for fever, chills and fatigue. Negative for diaphoresis and appetite change.  HENT: Positive for congestion, ear pain (right ear), postnasal drip, rhinorrhea, sinus pressure, sneezing, sore throat and voice change. Negative for mouth sores and nosebleeds.   Eyes: Positive for discharge.  Respiratory: Positive for shortness of breath and wheezing. Negative for chest tightness.   Cardiovascular: Negative for chest pain and palpitations.  Gastrointestinal: Negative for nausea, vomiting and abdominal pain.  Neurological: Positive for headaches. Negative for dizziness and weakness.    Social History  Substance Use Topics  . Smoking status: Current Every Day Smoker -- 0.75 packs/day    Types: Cigarettes  . Smokeless tobacco: Not on file  . Alcohol Use: No   Objective:   BP 112/74 mmHg  Pulse 105  Temp(Src) 98.3 F (36.8 C) (Oral)  Resp 24  Wt 175 lb (79.379 kg)  SpO2 93%  Physical Exam  General Appearance:    Alert, cooperative, no distress  HENT:   bilateral TM normal without fluid or infection, neck without nodes, neck has  bilateral anterior cervical nodes enlarged, pharynx erythematous without exudate and maxillary sinus tender  Eyes:    PERRL, conjunctiva/corneas clear, EOM's intact       Lungs:     Clear to auscultation bilaterally, respirations unlabored  Heart:    Regular rate and rhythm  Neurologic:   Awake, alert, oriented x 3. No apparent focal neurological           defect.         Results for orders placed or performed in visit on 07/13/15  POCT HgB A1C  Result Value Ref Range   Hemoglobin A1C 7.0    Est. average glucose Bld gHb Est-mCnc 154    Epworth=16    Assessment & Plan:     1. Controlled type 2 diabetes mellitus without complication, without long-term current use of insulin (HCC) Stable. Continue current medications.   - POCT HgB A1C  2. Hypersomnia High risk sleep apea - Nocturnal polysomnography (NPSG); Future  3. Bronchitis  - levofloxacin (LEVAQUIN) 500 MG tablet; Take 1 tablet (500 mg total) by mouth daily.  Dispense: 7 tablet; Refill: 0  4. COPD exacerbation (HCC)  - predniSONE (DELTASONE) 10 MG tablet; 6  tablets for 2 days, then 5 for 2 days, then 4 for 2 days, then 3 for 2 days, then 2 for 2 days, then 1 for 2 days.  Dispense: 42 tablet; Refill: 0  5. Chronic obstructive pulmonary disease, unspecified COPD type ([place])  - Nocturnal polysomnography (NPSG); Future  Call if symptoms change or if not rapidly improving.   Addressed extensive list of chronic and acute medical problems today requiring extensive time in counseling and coordination care.  Over half of this 45 minute visit were spent in counseling and coordinating care of multiple medical problems.         Lelon Huh, MD  [facility]

## 2015-07-20 ENCOUNTER — Other Ambulatory Visit: Payer: Self-pay | Admitting: Family Medicine

## 2015-07-20 ENCOUNTER — Telehealth: Payer: Self-pay | Admitting: Family Medicine

## 2015-07-20 NOTE — Telephone Encounter (Signed)
Pt requesting a refill of HYDROcodone-acetaminophen (NORCO) 10-325 MG tablet states she would like to go ahead and get pritned RX b/c of the holidays.  She also is requesting a 3 mth RX instead of mth to mth.

## 2015-07-20 NOTE — Telephone Encounter (Signed)
Order for polysomnography faxed to [facility]

## 2015-07-21 MED ORDER — HYDROCODONE-ACETAMINOPHEN 10-325 MG PO TABS: 1.0000 | ORAL_TABLET | ORAL | Status: DC | PRN

## 2015-08-02 ENCOUNTER — Ambulatory Visit: Payer: Medicare Other | Attending: Otolaryngology

## 2015-08-02 DIAGNOSIS — J449 Chronic obstructive pulmonary disease, unspecified: Secondary | ICD-10-CM | POA: Insufficient documentation

## 2015-08-02 DIAGNOSIS — E669 Obesity, unspecified: Secondary | ICD-10-CM | POA: Diagnosis not present

## 2015-08-02 DIAGNOSIS — E663 Overweight: Secondary | ICD-10-CM | POA: Diagnosis not present

## 2015-08-02 DIAGNOSIS — R0683 Snoring: Secondary | ICD-10-CM | POA: Insufficient documentation

## 2015-08-02 DIAGNOSIS — G4730 Sleep apnea, unspecified: Secondary | ICD-10-CM | POA: Diagnosis not present

## 2015-08-04 ENCOUNTER — Ambulatory Visit (INDEPENDENT_AMBULATORY_CARE_PROVIDER_SITE_OTHER): Payer: Medicare Other | Admitting: Family Medicine

## 2015-08-04 ENCOUNTER — Encounter: Payer: Self-pay | Admitting: Family Medicine

## 2015-08-04 VITALS — BP 120/78 | HR 110 | Temp 98.7°F | Resp 20 | Wt 181.0 lb

## 2015-08-04 DIAGNOSIS — R42 Dizziness and giddiness: Secondary | ICD-10-CM

## 2015-08-04 MED ORDER — METFORMIN HCL ER 500 MG PO TB24: 1000.0000 mg | ORAL_TABLET | Freq: Two times a day (BID) | ORAL | Status: DC

## 2015-08-04 MED ORDER — OXYCODONE-ACETAMINOPHEN 10-325 MG PO TABS: ORAL_TABLET | ORAL | Status: DC

## 2015-08-04 MED ORDER — MECLIZINE HCL 25 MG PO TABS: 25.0000 mg | ORAL_TABLET | Freq: Three times a day (TID) | ORAL | Status: AC | PRN

## 2015-08-04 NOTE — Progress Notes (Signed)
Patient: Yvette Riley Female    DOB: February 20, 1955   60 y.o.   MRN: [id number] Visit Date: 08/04/2015  Today's Provider: Lelon Huh, MD   Chief Complaint  Patient presents with  . Dizziness  . Headache   Subjective:    Dizziness This is a recurrent problem. The current episode started in the past 7 days. The problem occurs intermittently. The problem has been unchanged. Associated symptoms include arthralgias, coughing, headaches, myalgias, neck pain and vertigo. Pertinent negatives include no abdominal pain, anorexia, change in bowel habit, chest pain, chills, fatigue, fever, joint swelling, nausea, numbness, sore throat, swollen glands, urinary symptoms, visual change, vomiting or weakness. The symptoms are aggravated by walking, standing, twisting and bending. She has tried nothing for the symptoms.  Headache  This is a new problem. The current episode started 1 to 4 weeks ago (2 weeks). The problem occurs intermittently. The problem has been unchanged. The pain is located in the temporal and retro-orbital region. The pain does not radiate. The pain quality is not similar to prior headaches. The quality of the pain is described as sharp, dull and aching. The pain is at a severity of 3/10. The pain is mild. Associated symptoms include back pain, coughing, dizziness, drainage, ear pain, eye pain, eye watering, insomnia, a loss of balance, muscle aches, neck pain, photophobia, rhinorrhea and tinnitus. Pertinent negatives include no abdominal pain, abnormal behavior, anorexia, blurred vision, eye redness, facial sweating, fever, hearing loss, nausea, numbness, phonophobia, scalp tenderness, seizures, sinus pressure, sore throat, swollen glands, tingling, visual change, vomiting, weakness or weight loss. Nothing aggravates the symptoms. She has tried nothing for the symptoms. There is no history of cluster headaches, migraine headaches or migraines in the family.    Patient stated that  for the last 2 weeks she has been having headaches in temporal and orbital. Headaches come intermittently without any reason and last for about an hour. Headaches are described as sharp at onset and then become dull.   Dizziness is recurrent however has had more episodes in the last week. Has dizziness intermittently when standing, walking or bending. States after getting up in bed the rooms starts spinning.     Allergies  Allergen Reactions  . Codeine Anaphylaxis  . Crestor [Rosuvastatin] Anaphylaxis  . Penicillins Anaphylaxis, Rash and Other (See Comments)    Reaction:  Tongue swelling  Has patient had a PCN reaction causing immediate rash, facial/tongue/throat swelling, SOB or lightheadedness with hypotension:  Yes   Has patient had a PCN reaction causing severe rash involving mucus membranes or skin necrosis: No Has patient had a PCN reaction that required hospitalization No Has patient had a PCN reaction occurring within the last 10 years: No If all of the above answers are "NO", then may proceed with Cephalosporin use.  . Yellow Jacket Venom [Bee Venom] Anaphylaxis  . Gemfibrozil Rash  . Lipitor [Atorvastatin] Rash   Current Meds  Medication Sig  . albuterol (PROVENTIL) (2.5 MG/3ML) 0.083% nebulizer solution Take 3 mLs (2.5 mg total) by nebulization every 6 (six) hours as needed for wheezing or shortness of breath.  [name] aspirin EC 81 MG tablet Take 81 mg by mouth at bedtime.  . fluticasone (FLOVENT HFA) 110 MCG/ACT inhaler Inhale 2 puffs into the lungs 2 (two) times daily as needed (for shortness of breath/wheezing).  [name] glucose blood test strip Check blood sugar daily. Dx Type 2 diabetes E11.9  . HYDROcodone-acetaminophen (NORCO) 10-325 MG tablet Take 1 tablet  by mouth every 4 (four) hours as needed for moderate pain.  [name] levalbuterol (XOPENEX HFA) 45 MCG/ACT inhaler Inhale 1-2 puffs into the lungs every 4 (four) hours as needed for wheezing or shortness of breath.  . meloxicam  (MOBIC) 15 MG tablet Take 15 mg by mouth daily as needed for pain.  . metFORMIN (GLUCOPHAGE) 500 MG tablet Take 2 tablets (1,000 mg total) by mouth 2 (two) times daily with a meal.  . neomycin-polymyxin-hydrocortisone (CORTISPORIN) 3.5-10000-1 ophthalmic suspension Place 2 drops into the left eye 4 (four) times daily.  [name] zolpidem (AMBIEN) 10 MG tablet Take 10 mg by mouth at bedtime.    Review of Systems  Constitutional: Negative for fever, chills, weight loss, appetite change and fatigue.  HENT: Positive for ear pain, rhinorrhea and tinnitus. Negative for hearing loss, sinus pressure and sore throat.   Eyes: Positive for photophobia and pain. Negative for blurred vision and redness.  Respiratory: Positive for cough. Negative for chest tightness and shortness of breath.   Cardiovascular: Negative for chest pain and palpitations.  Gastrointestinal: Negative for nausea, vomiting, abdominal pain, anorexia and change in bowel habit.  Musculoskeletal: Positive for myalgias, back pain, arthralgias and neck pain. Negative for joint swelling.  Neurological: Positive for dizziness, vertigo, headaches and loss of balance. Negative for tingling, seizures, weakness and numbness.  Psychiatric/Behavioral: The patient has insomnia.     Social History  Substance Use Topics  . Smoking status: Current Every Day Smoker -- 0.75 packs/day    Types: Cigarettes  . Smokeless tobacco: Not on file  . Alcohol Use: No   Objective:   BP 120/78 mmHg  Pulse 110  Temp(Src) 98.7 F (37.1 C) (Oral)  Resp 20  Wt 181 lb (82.101 kg)  SpO2 93%  Physical Exam   General Appearance:    Alert, cooperative, no distress  Eyes:    PERRL, conjunctiva/corneas clear, EOM's intact. Slight nystagmus when turning head to right.    Lungs:     Clear to auscultation bilaterally, respirations unlabored  Heart:    Regular rate and rhythm  Neurologic:   Awake, alert, oriented x 3. No apparent focal neurological           Defect.  Normal finger to nose. Negative Rhomberg. +5/5 muscle strength. No s/s deficits.           Assessment & Plan:     1. Vertigo  - meclizine (ANTIVERT) 25 MG tablet; Take 1 tablet (25 mg total) by mouth 3 (three) times daily as needed for dizziness.  Dispense: 30 tablet; Refill: 0  Call if symptoms change or if not rapidly improving.          Lelon Huh, MD  [facility]

## 2015-08-10 ENCOUNTER — Other Ambulatory Visit: Payer: Self-pay | Admitting: Family Medicine

## 2015-08-31 ENCOUNTER — Other Ambulatory Visit: Payer: Self-pay | Admitting: Family Medicine

## 2015-08-31 MED ORDER — OXYCODONE-ACETAMINOPHEN 10-325 MG PO TABS: ORAL_TABLET | ORAL | 0 refills | Status: DC

## 2015-08-31 NOTE — Telephone Encounter (Signed)
Pt needs refill on oxycodone 10/325.  Please call when ready to pick up.  She is going out of town and needs to pick up order early.  Her call back is [phone]  Thank sTeri

## 2015-09-04 NOTE — Telephone Encounter (Signed)
error 

## 2015-09-07 ENCOUNTER — Other Ambulatory Visit: Payer: Self-pay | Admitting: Family Medicine

## 2015-09-08 ENCOUNTER — Ambulatory Visit: Payer: Medicare Other | Admitting: Family Medicine

## 2015-09-08 NOTE — Telephone Encounter (Signed)
Please call in zolpidem  

## 2015-09-13 NOTE — Telephone Encounter (Signed)
error 

## 2015-09-19 ENCOUNTER — Encounter: Payer: Self-pay | Admitting: Family Medicine

## 2015-09-19 ENCOUNTER — Ambulatory Visit (INDEPENDENT_AMBULATORY_CARE_PROVIDER_SITE_OTHER): Payer: Medicare Other | Admitting: Family Medicine

## 2015-09-19 VITALS — BP 120/80 | HR 97 | Temp 97.7°F | Resp 18 | Ht 59.0 in | Wt 185.0 lb

## 2015-09-19 DIAGNOSIS — M545 Low back pain: Secondary | ICD-10-CM | POA: Diagnosis not present

## 2015-09-19 DIAGNOSIS — R42 Dizziness and giddiness: Secondary | ICD-10-CM

## 2015-09-19 DIAGNOSIS — J449 Chronic obstructive pulmonary disease, unspecified: Secondary | ICD-10-CM

## 2015-09-19 DIAGNOSIS — M5116 Intervertebral disc disorders with radiculopathy, lumbar region: Secondary | ICD-10-CM

## 2015-09-19 NOTE — Progress Notes (Signed)
Patient: Yvette Riley Female    DOB: 10-Nov-1955   60 y.o.   MRN: [id number] Visit Date: 09/19/2015  Today's Provider: Lelon Huh, MD   Chief Complaint  Patient presents with  . Follow-up  . Dizziness   Subjective:    HPI  Vertigo: From 08/04/15-started meclizine (ANTIVERT) 25 MG tablet; Take 1 tablet (25 mg total) by mouth 3 (three) times daily as needed for dizziness.    Patient states that she is still having some dizziness in the mornings. Patient states that she is not having any nausea with the dizziness.  States that she is having more trouble with low back pain. Is very stiff. Difficult to get out of chairs. Is taking oxycodone on a regular schedule.    MRI September 2016- Chronic small right faraminal disc protrusion at L4-L5. Mild disc degeneration at L5-S1 and L!-L2. Stable right T11 foraminal stenosis.    COPD She continue to report feeling very short of breath when she lies down to sleep at night. She also gets short of breath easily when walking. When we saw her in march her baseline oximetry on room air was 94%, and 93% after 5 minutes of walking. She has MSPG on 08/02/2015 which did NOT show OSA and lowest oximetry of 88.3%. Patient states her head was elevated during sleep study, but she is not normal able to sleep with her head elevated. RT has request home o/n oximetry to see if she qualifies for nocturnal oxygen in her usual home environment.   Allergies  Allergen Reactions  . Codeine Anaphylaxis  . Crestor [Rosuvastatin] Anaphylaxis  . Penicillins Anaphylaxis, Rash and Other (See Comments)    Reaction:  Tongue swelling  Has patient had a PCN reaction causing immediate rash, facial/tongue/throat swelling, SOB or lightheadedness with hypotension:  Yes   Has patient had a PCN reaction causing severe rash involving mucus membranes or skin necrosis: No Has patient had a PCN reaction that required hospitalization No Has patient had a PCN reaction  occurring within the last 10 years: No If all of the above answers are "NO", then may proceed with Cephalosporin use.  . Yellow Jacket Venom [Bee Venom] Anaphylaxis  . Gemfibrozil Rash  . Lipitor [Atorvastatin] Rash   Current Meds  Medication Sig  . albuterol (PROVENTIL) (2.5 MG/3ML) 0.083% nebulizer solution Take 3 mLs (2.5 mg total) by nebulization every 6 (six) hours as needed for wheezing or shortness of breath.  [name] aspirin EC 81 MG tablet Take 81 mg by mouth at bedtime.  . fluticasone (FLOVENT HFA) 110 MCG/ACT inhaler Inhale 2 puffs into the lungs 2 (two) times daily as needed (for shortness of breath/wheezing).  [name] glucose blood test strip Check blood sugar daily. Dx Type 2 diabetes E11.9  . levalbuterol (XOPENEX HFA) 45 MCG/ACT inhaler Inhale 1-2 puffs into the lungs every 4 (four) hours as needed for wheezing or shortness of breath.  . meloxicam (MOBIC) 15 MG tablet TAKE ONE TABLET BY MOUTH ONCE DAILY  . metFORMIN (GLUCOPHAGE XR) 500 MG 24 hr tablet Take 2 tablets (1,000 mg total) by mouth 2 (two) times daily. With meals  . neomycin-polymyxin-hydrocortisone (CORTISPORIN) 3.5-10000-1 ophthalmic suspension Place 2 drops into the left eye 4 (four) times daily.  [name] oxyCODONE-acetaminophen (PERCOCET) 10-325 MG tablet 1 tablet every 4-6 hours as needed  . zolpidem (AMBIEN) 10 MG tablet TAKE ONE TABLET BY MOUTH AT BEDTIME    Review of Systems  Constitutional: Negative for appetite change, chills,  fatigue and fever.  Respiratory: Negative for chest tightness and shortness of breath.   Cardiovascular: Negative for chest pain and palpitations.  Gastrointestinal: Negative for abdominal pain, nausea and vomiting.  Neurological: Positive for dizziness. Negative for weakness.    Social History  Substance Use Topics  . Smoking status: Current Every Day Smoker    Packs/day: 0.75    Types: Cigarettes  . Smokeless tobacco: Not on file  . Alcohol use No   Objective:   BP 120/80 (BP Location:  Right Arm, Patient Position: Sitting, Cuff Size: Normal)   Pulse 97   Temp 97.7 F (36.5 C) (Oral)   Resp 18   Ht '4\' 11"'$  (1.499 m)   Wt 185 lb (83.9 kg)   SpO2 92%   BMI 37.37 kg/m   Physical Exam  General Appearance:    Alert, cooperative, no distress, obese  Eyes:    PERRL, conjunctiva/corneas clear, EOM's intact       Lungs:     Clear to auscultation bilaterally, respirations unlabored  Heart:    Regular rate and rhythm  Neurologic:   Awake, alert, oriented x 3. No apparent focal neurological           defect.           Assessment & Plan:     1. Lumbar disc disease with radiculopathy She was seen by Dr Arnoldo Morale last year and declined invasive treatments at that time. However she is concerned now because pain has progressed and her mobility is much more limited. Discussed various modalities including PT, invasive intervention, and adequately pain control. Will get back to follow up with neurosurgery to discuss options in more detail.  - Ambulatory referral to Neurosurgery  2. Midline low back pain, with sciatica presence unspecified  - Ambulatory referral to Neurosurgery  3. Chronic obstructive pulmonary disease, unspecified COPD type (HCC) Severe, but does not qualify for daytime O2. She states she can't breath when she lays down to sleep without oxygen. Will obtain o/n oxygen on room air. Encourage to raise head above bed when sleeping,.   4. Vertigo Resolved.        Lelon Huh, MD  [facility]

## 2015-09-25 ENCOUNTER — Telehealth: Payer: Self-pay | Admitting: Family Medicine

## 2015-09-25 MED ORDER — OXYCODONE-ACETAMINOPHEN 10-325 MG PO TABS: ORAL_TABLET | ORAL | 0 refills | Status: DC

## 2015-09-25 NOTE — Telephone Encounter (Signed)
Ok to refill? Please advise.  

## 2015-09-25 NOTE — Telephone Encounter (Signed)
Pt contacted office for refill request on the following medications:  oxyCODONE-acetaminophen (PERCOCET) 10-325 MG tablet.  Pt is requesting a 90 day supply.  XQ#[phone]/LL

## 2015-09-27 DIAGNOSIS — J449 Chronic obstructive pulmonary disease, unspecified: Secondary | ICD-10-CM | POA: Diagnosis not present

## 2015-10-03 ENCOUNTER — Encounter: Payer: Self-pay | Admitting: Family Medicine

## 2015-10-10 ENCOUNTER — Ambulatory Visit: Payer: Medicare Other | Admitting: Family Medicine

## 2015-10-10 ENCOUNTER — Encounter: Payer: Self-pay | Admitting: Family Medicine

## 2015-10-10 ENCOUNTER — Ambulatory Visit (INDEPENDENT_AMBULATORY_CARE_PROVIDER_SITE_OTHER): Payer: Medicare Other | Admitting: Family Medicine

## 2015-10-10 VITALS — BP 110/64 | HR 122 | Temp 98.6°F | Resp 16 | Ht 59.0 in | Wt 190.0 lb

## 2015-10-10 DIAGNOSIS — G4700 Insomnia, unspecified: Secondary | ICD-10-CM

## 2015-10-10 DIAGNOSIS — R0609 Other forms of dyspnea: Secondary | ICD-10-CM

## 2015-10-10 DIAGNOSIS — J449 Chronic obstructive pulmonary disease, unspecified: Secondary | ICD-10-CM

## 2015-10-10 MED ORDER — FLUTICASONE-SALMETEROL 250-50 MCG/DOSE IN AEPB: 1.0000 | INHALATION_SPRAY | Freq: Two times a day (BID) | RESPIRATORY_TRACT | 3 refills | Status: DC

## 2015-10-10 NOTE — Progress Notes (Signed)
Patient: Yvette Riley Female    DOB: Mar 11, 1955   60 y.o.   MRN: [id number] Visit Date: 10/10/2015  Today's Provider: Lelon Huh, MD   Chief Complaint  Patient presents with  . COPD   Subjective:    Patient is here to discuss her oxygen. Patient stated when she lays down at night, she has shortness of breath. Baseline oximetry on room air was 95% and 94% being ambulatory for 5 minutes.     Chronic obstructive pulmonary disease, unspecified COPD type ([place]): She has established COPD. She had low FVC=1.56 literes (55%ile) FEV1=1.27 (56%) and FEV1/FVC/FVC = 79% (102%ile) on spirometry 05/24/2011. She reports have severe shortness of breath with minimal exertion. She props her head up on several pillows when she tries to sleep, but states she feels like she is suffocating when she is not using oxygen. She had o/n home oximetry on room air on 09/27/2015 with lowest oximetry = 83%, but only 2:04 minutes <=88%, and 4:28 minutes under 89<=89%  She is hear today because she feels like she needs oxygen to breath, but does not meet insurance requirements. She continues to smoke. She states she has Qvar inhaler but it costs over $200 and she cannot afford to get it refilled. She had taken Advair in the past. She is using Xopenex every day. She states she has been followed by cardiologist Dr. Humphrey Rolls and had normal cardiac testing over the last 2 years.     Allergies  Allergen Reactions  . Codeine Anaphylaxis  . Crestor [Rosuvastatin] Anaphylaxis  . Penicillins Anaphylaxis, Rash and Other (See Comments)    Reaction:  Tongue swelling  Has patient had a PCN reaction causing immediate rash, facial/tongue/throat swelling, SOB or lightheadedness with hypotension:  Yes   Has patient had a PCN reaction causing severe rash involving mucus membranes or skin necrosis: No Has patient had a PCN reaction that required hospitalization No Has patient had a PCN reaction occurring within the last 10 years:  No If all of the above answers are "NO", then may proceed with Cephalosporin use.  . Yellow Jacket Venom [Bee Venom] Anaphylaxis  . Gemfibrozil Rash  . Lipitor [Atorvastatin] Rash     Current Outpatient Prescriptions:  .  albuterol (PROVENTIL) (2.5 MG/3ML) 0.083% nebulizer solution, Take 3 mLs (2.5 mg total) by nebulization every 6 (six) hours as needed for wheezing or shortness of breath., Disp: 150 mL, Rfl: 1 .  aspirin EC 81 MG tablet, Take 81 mg by mouth at bedtime., Disp: , Rfl:  .  fluticasone (FLOVENT HFA) 110 MCG/ACT inhaler, Inhale 2 puffs into the lungs 2 (two) times daily as needed (for shortness of breath/wheezing)., Disp: 1 Inhaler, Rfl: 12 .  glucose blood test strip, Check blood sugar daily. Dx Type 2 diabetes E11.9, Disp: 100 each, Rfl: 3 .  levalbuterol (XOPENEX HFA) 45 MCG/ACT inhaler, Inhale 1-2 puffs into the lungs every 4 (four) hours as needed for wheezing or shortness of breath., Disp: 1 Inhaler, Rfl: 12 .  meloxicam (MOBIC) 15 MG tablet, TAKE ONE TABLET BY MOUTH ONCE DAILY, Disp: 90 tablet, Rfl: 1 .  metFORMIN (GLUCOPHAGE XR) 500 MG 24 hr tablet, Take 2 tablets (1,000 mg total) by mouth 2 (two) times daily. With meals, Disp: 360 tablet, Rfl: 4 .  neomycin-polymyxin-hydrocortisone (CORTISPORIN) 3.5-10000-1 ophthalmic suspension, Place 2 drops into the left eye 4 (four) times daily., Disp: 7.5 mL, Rfl: 0 .  oxyCODONE-acetaminophen (PERCOCET) 10-325 MG tablet, 1 tablet every 4-6  hours as needed, Disp: 180 tablet, Rfl: 0 .  zolpidem (AMBIEN) 10 MG tablet, TAKE ONE TABLET BY MOUTH AT BEDTIME, Disp: 30 tablet, Rfl: 4  Review of Systems  Constitutional: Negative for appetite change, chills, fatigue and fever.  Respiratory: Positive for shortness of breath and wheezing. Negative for chest tightness.   Cardiovascular: Negative for chest pain and palpitations.  Gastrointestinal: Negative for abdominal pain, nausea and vomiting.  Neurological: Negative for dizziness and  weakness.    Social History  Substance Use Topics  . Smoking status: Current Every Day Smoker    Packs/day: 0.75    Types: Cigarettes  . Smokeless tobacco: Not on file  . Alcohol use No   Objective:   BP 110/64 (BP Location: Right Arm, Patient Position: Sitting, Cuff Size: Large)   Pulse 85   Temp 98.6 F (37 C) (Oral)   Resp 16   Ht '4\' 11"'$  (1.499 m)   Wt 190 lb (86.2 kg)   SpO2 95%   BMI 38.38 kg/m   Physical Exam  General Appearance:    Alert, cooperative, no distress  HENT:   ENT exam normal, no neck nodes or sinus tenderness  Eyes:    PERRL, conjunctiva/corneas clear, EOM's intact       Lungs:     Occasional expiratory wheezing. Slightly diminished air movement, respirations unlabored  Heart:    Regular rate and rhythm  Neurologic:   Awake, alert, oriented x 3. No apparent focal neurological           defect.           Assessment & Plan:     1. Dyspnea on exertion Her lowest oximetry reading after walking over 5 minutes was 94% on room air. She did not meet requirements for nocturnal oxygen. She likely has very poor conditioning. May benefit from pulmonary rehab. If labs are normal will refer to pulmonology.  - Brain natriuretic peptide - CBC  2. Chronic obstructive pulmonary disease, unspecified COPD type ([place]) She states she is using QVar, but it is not showing on medication dispense report. I'm not convinced she is using this consistently. Given new rx for Advair '250mg'$  twice a day which she has taken in the past.  Emphasized importance of smoking cessation.   3. Insomnia She complains of being more forgetful the last several months. Counseled that this is common adverse effect of Ambien and oxycodone. She does not want to change medications at this point. Advised to call back if she changes her mind. She states she does not tolerate trazodone.        Lelon Huh, MD  [facility]

## 2015-10-12 ENCOUNTER — Telehealth: Payer: Self-pay | Admitting: Family Medicine

## 2015-10-12 NOTE — Telephone Encounter (Signed)
Pt called wanting to know why Dr. Caryn Section discontinued her oxygen.  Please advise. [phone]  Thanks Con Memos

## 2015-10-13 NOTE — Telephone Encounter (Signed)
I called patient back and advised her per her last office note on 10/10/2015: Her lowest oximetry reading after walking over 5 minutes was 94% on room air. She did not meet requirements for nocturnal oxygen.  I also advised patient that we are still waiting on lab results. Patient states she plans to get labs done this morning.

## 2015-10-23 ENCOUNTER — Telehealth: Payer: Self-pay | Admitting: Family Medicine

## 2015-10-23 NOTE — Telephone Encounter (Signed)
Pt contacted office for refill request on the following medications: oxyCODONE-acetaminophen (PERCOCET) 10-325 MG tablet Last written: 09/25/15 Last OV: 10/10/15 Pt was advised that Dr. Caryn Section is out of the office until 10/30/15 and would like another provider to write the RX. Please advise. Thanks TNP

## 2015-10-23 NOTE — Telephone Encounter (Signed)
Please review. Thanks!  

## 2015-10-24 ENCOUNTER — Other Ambulatory Visit: Payer: Self-pay

## 2015-10-24 MED ORDER — OXYCODONE-ACETAMINOPHEN 10-325 MG PO TABS: ORAL_TABLET | ORAL | 0 refills | Status: DC

## 2015-10-24 NOTE — Telephone Encounter (Signed)
Printed, to be signed and pt to pick up ED

## 2015-10-26 DIAGNOSIS — E6609 Other obesity due to excess calories: Secondary | ICD-10-CM | POA: Diagnosis not present

## 2015-10-26 DIAGNOSIS — J439 Emphysema, unspecified: Secondary | ICD-10-CM | POA: Diagnosis not present

## 2015-10-26 DIAGNOSIS — R0602 Shortness of breath: Secondary | ICD-10-CM | POA: Diagnosis not present

## 2015-10-26 DIAGNOSIS — R05 Cough: Secondary | ICD-10-CM | POA: Diagnosis not present

## 2015-10-26 DIAGNOSIS — R0609 Other forms of dyspnea: Secondary | ICD-10-CM | POA: Diagnosis not present

## 2015-11-13 ENCOUNTER — Encounter: Payer: Self-pay | Admitting: Family Medicine

## 2015-11-13 ENCOUNTER — Ambulatory Visit (INDEPENDENT_AMBULATORY_CARE_PROVIDER_SITE_OTHER): Payer: Medicare Other | Admitting: Family Medicine

## 2015-11-13 VITALS — BP 112/80 | HR 99 | Temp 99.1°F | Resp 18 | Wt 192.0 lb

## 2015-11-13 DIAGNOSIS — E119 Type 2 diabetes mellitus without complications: Secondary | ICD-10-CM | POA: Diagnosis not present

## 2015-11-13 DIAGNOSIS — E781 Pure hyperglyceridemia: Secondary | ICD-10-CM

## 2015-11-13 DIAGNOSIS — I10 Essential (primary) hypertension: Secondary | ICD-10-CM

## 2015-11-13 DIAGNOSIS — Z720 Tobacco use: Secondary | ICD-10-CM

## 2015-11-13 DIAGNOSIS — J449 Chronic obstructive pulmonary disease, unspecified: Secondary | ICD-10-CM

## 2015-11-13 LAB — POCT GLYCOSYLATED HEMOGLOBIN (HGB A1C)
Est. average glucose Bld gHb Est-mCnc: 160
Hemoglobin A1C: 7.2

## 2015-11-13 MED ORDER — BUPROPION HCL ER (SR) 150 MG PO TB12: ORAL_TABLET | ORAL | 3 refills | Status: DC

## 2015-11-13 MED ORDER — MELOXICAM 15 MG PO TABS: 15.0000 mg | ORAL_TABLET | Freq: Every day | ORAL | 2 refills | Status: DC | PRN

## 2015-11-13 MED ORDER — GEMFIBROZIL 600 MG PO TABS: 600.0000 mg | ORAL_TABLET | Freq: Two times a day (BID) | ORAL | 1 refills | Status: DC

## 2015-11-13 NOTE — Patient Instructions (Signed)
Need to stop smoking

## 2015-11-13 NOTE — Progress Notes (Signed)
Patient: ARIANIE COUSE Female    DOB: 08-Mar-1955   60 y.o.   MRN: [id number] Visit Date: 11/13/2015  Today's Provider: Lelon Huh, MD   Chief Complaint  Patient presents with  . Hypertension    follow up  . Diabetes    follow up  . COPD    follow up   Subjective:    HPI  Hypertension, follow-up:  BP Readings from Last 3 Encounters:  10/10/15 110/64  09/19/15 120/80  08/04/15 120/78    She was last seen for hypertension 8 months ago.  BP at that visit was 106/70. Management since that visit includes no changes. She reports good compliance with treatment. She is not having side effects.  She is not exercising. She is adherent to low salt diet.   Outside blood pressures are not being checked. She is experiencing dyspnea and fatigue.  Patient denies chest pain, chest pressure/discomfort, claudication, exertional chest pressure/discomfort, irregular heart beat, lower extremity edema, near-syncope, orthopnea, palpitations, paroxysmal nocturnal dyspnea, syncope and tachypnea.   Cardiovascular risk factors include diabetes mellitus, hypertension and obesity (BMI >= 30 kg/m2).  Use of agents associated with hypertension: NSAIDS.     Weight trend: fluctuating a bit Wt Readings from Last 3 Encounters:  10/10/15 190 lb (86.2 kg)  09/19/15 185 lb (83.9 kg)  08/04/15 181 lb (82.1 kg)    Current diet: in general, an "unhealthy" diet  ------------------------------------------------------------------------  Diabetes Mellitus Type II, Follow-up:   Lab Results  Component Value Date   HGBA1C 7.0 07/13/2015   HGBA1C 6.5 03/15/2015   HGBA1C 6.1 09/30/2014    Last seen for diabetes 4 months ago.  Management since then includes no changes. She reports good compliance with treatment. She is not having side effects.  Current symptoms include none and have been stable. Home blood sugar records: fasting range: 130  Episodes of hypoglycemia? yes -as low as 70-  occurs in the evenings    Current Insulin Regimen: none Most Recent Eye Exam: >1 year Dr. Gloriann Loan Weight trend: fluctuating a bit Prior visit with dietician: no Current diet: in general, an "unhealthy" diet Current exercise: none  Pertinent Labs:    Component Value Date/Time   CREATININE 0.58 03/29/2015 1122   CREATININE 0.68 02/02/2012 1114    Wt Readings from Last 3 Encounters:  10/10/15 190 lb (86.2 kg)  09/19/15 185 lb (83.9 kg)  08/04/15 181 lb (82.1 kg)    ------------------------------------------------------------------------ Follow up COPD:  Patient was last seen on 10/10/2015 for this problem. Management during this visit includes counseling patient quit smoking. Since the last visit, patient has been seen by her Pulmonologist Dr. Vella Kohler on 10/26/2015 who started patient on Anoro she was advised to follow up in 3 weeks. Patient states her breathing has worsened since the last office visit. Patient is still smoking.     Allergies  Allergen Reactions  . Codeine Anaphylaxis  . Crestor [Rosuvastatin] Anaphylaxis  . Penicillins Anaphylaxis, Rash and Other (See Comments)    Reaction:  Tongue swelling  Has patient had a PCN reaction causing immediate rash, facial/tongue/throat swelling, SOB or lightheadedness with hypotension:  Yes   Has patient had a PCN reaction causing severe rash involving mucus membranes or skin necrosis: No Has patient had a PCN reaction that required hospitalization No Has patient had a PCN reaction occurring within the last 10 years: No If all of the above answers are "NO", then may proceed with Cephalosporin use.  . Yellow  Jacket Venom [Bee Venom] Anaphylaxis  . Trazodone And Nefazodone   . Gemfibrozil Rash  . Lipitor [Atorvastatin] Rash     Current Outpatient Prescriptions:  .  albuterol (PROVENTIL) (2.5 MG/3ML) 0.083% nebulizer solution, Take 3 mLs (2.5 mg total) by nebulization every 6 (six) hours as needed for wheezing or shortness of  breath., Disp: 150 mL, Rfl: 1 .  aspirin EC 81 MG tablet, Take 81 mg by mouth at bedtime., Disp: , Rfl:  .  Fluticasone-Salmeterol (ADVAIR DISKUS) 250-50 MCG/DOSE AEPB, Inhale 1 puff into the lungs 2 (two) times daily., Disp: 60 each, Rfl: 3 .  glucose blood test strip, Check blood sugar daily. Dx Type 2 diabetes E11.9, Disp: 100 each, Rfl: 3 .  levalbuterol (XOPENEX HFA) 45 MCG/ACT inhaler, Inhale 1-2 puffs into the lungs every 4 (four) hours as needed for wheezing or shortness of breath., Disp: 1 Inhaler, Rfl: 12 .  meloxicam (MOBIC) 15 MG tablet, TAKE ONE TABLET BY MOUTH ONCE DAILY, Disp: 90 tablet, Rfl: 1 .  metFORMIN (GLUCOPHAGE XR) 500 MG 24 hr tablet, Take 2 tablets (1,000 mg total) by mouth 2 (two) times daily. With meals, Disp: 360 tablet, Rfl: 4 .  neomycin-polymyxin-hydrocortisone (CORTISPORIN) 3.5-10000-1 ophthalmic suspension, Place 2 drops into the left eye 4 (four) times daily., Disp: 7.5 mL, Rfl: 0 .  oxyCODONE-acetaminophen (PERCOCET) 10-325 MG tablet, 1 tablet every 4-6 hours as needed, Disp: 180 tablet, Rfl: 0 .  zolpidem (AMBIEN) 10 MG tablet, TAKE ONE TABLET BY MOUTH AT BEDTIME, Disp: 30 tablet, Rfl: 4  Review of Systems  Constitutional: Positive for fatigue. Negative for appetite change, chills and fever.  Respiratory: Positive for shortness of breath. Negative for chest tightness.   Cardiovascular: Negative for chest pain, palpitations and leg swelling.  Gastrointestinal: Negative for abdominal pain, nausea and vomiting.  Neurological: Positive for dizziness and light-headedness. Negative for weakness.    Social History  Substance Use Topics  . Smoking status: Current Every Day Smoker    Packs/day: 0.75    Types: Cigarettes  . Smokeless tobacco: Not on file  . Alcohol use No   Objective:   BP 112/80 (BP Location: Left Arm, Patient Position: Sitting, Cuff Size: Large)   Pulse 99   Temp 99.1 F (37.3 C) (Oral)   Resp 18   Wt 192 lb (87.1 kg)   SpO2 94% Comment:  room air  BMI 38.78 kg/m   Physical Exam    General Appearance:    Alert, cooperative, no distress  Eyes:    PERRL, conjunctiva/corneas clear, EOM's intact       Lungs:     Clear to auscultation bilaterally, respirations unlabored  Heart:    Regular rate and rhythm  Neurologic:   Awake, alert, oriented x 3. No apparent focal neurological           defect.         Results for orders placed or performed in visit on 11/13/15  POCT HgB A1C  Result Value Ref Range   Hemoglobin A1C 7.2    Est. average glucose Bld gHb Est-mCnc 160        Assessment & Plan:     1. Type 2 diabetes mellitus without complication, without long-term current use of insulin (HCC) A1c up bit. She states she has not been following diet as well, but anticipates improvement. She is tolerating '1000mg'$  metformin twice a day which we will continue for now. Recheck a1c in 3 months. She requested information regarding diabetes management. Offered  referral to [facility] for refresher course which she declined today. .  - POCT HgB A1C  2. Chronic obstructive pulmonary disease, unspecified COPD type ([place]) Extensive counseling on asthma, emphysema, and COPD. She is scheduled for follow up Dr. Raul Del regarding. Anoro. Strongly encouraged to stop smoking.   3. Essential hypertension Well controlled.  Continue current medications.    4. Tobacco use Prescribed buproprion today. Advised to take for 2 weeks then attempt to stop smoking 'cold [place]'  5. Hypertriglyceridemia.       Lelon Huh, MD  [facility]

## 2015-11-21 DIAGNOSIS — Z6838 Body mass index (BMI) 38.0-38.9, adult: Secondary | ICD-10-CM | POA: Diagnosis not present

## 2015-11-21 DIAGNOSIS — M545 Low back pain: Secondary | ICD-10-CM | POA: Diagnosis not present

## 2015-11-22 ENCOUNTER — Other Ambulatory Visit: Payer: Self-pay | Admitting: Neurosurgery

## 2015-11-22 DIAGNOSIS — G8929 Other chronic pain: Secondary | ICD-10-CM

## 2015-11-22 DIAGNOSIS — M545 Low back pain: Principal | ICD-10-CM

## 2015-11-23 ENCOUNTER — Other Ambulatory Visit: Payer: Self-pay | Admitting: Family Medicine

## 2015-11-23 NOTE — Telephone Encounter (Signed)
Pt contacted office for refill request on the following medications:  oxyCODONE-acetaminophen (PERCOCET) 10-325 MG tablet.  BP#[phone]

## 2015-11-23 NOTE — Telephone Encounter (Signed)
Please review. Thanks!  

## 2015-11-24 MED ORDER — OXYCODONE-ACETAMINOPHEN 10-325 MG PO TABS: ORAL_TABLET | ORAL | 0 refills | Status: DC

## 2015-12-07 ENCOUNTER — Telehealth: Payer: Self-pay | Admitting: Family Medicine

## 2015-12-07 NOTE — Telephone Encounter (Signed)
Pt is requesting a refill on the ear drops that Dr. Caryn Section prescribed her about 3 years ago for ringing in her ears and dizzy. Pt stated she doesn't know the name of the medication. Pt stated she can't come in the office today and would like this sent to [facility]. Pt was advised that Dr. Caryn Section was out of the office this afternoon. Please advise. Thanks TNP

## 2015-12-07 NOTE — Telephone Encounter (Signed)
Patient called back and schedule ov with Adriana.

## 2015-12-07 NOTE — Telephone Encounter (Signed)
Please advise 

## 2015-12-08 ENCOUNTER — Ambulatory Visit (INDEPENDENT_AMBULATORY_CARE_PROVIDER_SITE_OTHER): Payer: Medicare Other | Admitting: Physician Assistant

## 2015-12-08 ENCOUNTER — Ambulatory Visit
Admission: RE | Admit: 2015-12-08 | Discharge: 2015-12-08 | Disposition: A | Discharge status: Home / Self Care | Payer: Medicare Other | Source: Ambulatory Visit | Attending: Neurosurgery | Admitting: Neurosurgery

## 2015-12-08 ENCOUNTER — Encounter: Payer: Self-pay | Admitting: Physician Assistant

## 2015-12-08 VITALS — BP 122/58 | HR 96 | Temp 98.9°F | Resp 16

## 2015-12-08 DIAGNOSIS — M1288 Other specific arthropathies, not elsewhere classified, other specified site: Secondary | ICD-10-CM | POA: Diagnosis not present

## 2015-12-08 DIAGNOSIS — M5126 Other intervertebral disc displacement, lumbar region: Secondary | ICD-10-CM | POA: Insufficient documentation

## 2015-12-08 DIAGNOSIS — M545 Low back pain: Secondary | ICD-10-CM | POA: Insufficient documentation

## 2015-12-08 DIAGNOSIS — R42 Dizziness and giddiness: Secondary | ICD-10-CM

## 2015-12-08 DIAGNOSIS — G8929 Other chronic pain: Secondary | ICD-10-CM | POA: Diagnosis not present

## 2015-12-08 DIAGNOSIS — M5127 Other intervertebral disc displacement, lumbosacral region: Secondary | ICD-10-CM | POA: Insufficient documentation

## 2015-12-08 MED ORDER — MECLIZINE HCL 25 MG PO TABS: 25.0000 mg | ORAL_TABLET | Freq: Three times a day (TID) | ORAL | 0 refills | Status: DC | PRN

## 2015-12-08 NOTE — Progress Notes (Signed)
Patient: Yvette Riley Female    DOB: [birth year]   60 y.o.   MRN: [id number] Visit Date: 12/08/2015  Today's Provider: Trinna Post, PA-C   Chief Complaint  Patient presents with  . Dizziness   Subjective:    Dizziness  This is a new problem. The current episode started in the past 7 days. The problem occurs constantly. The problem has been gradually worsening. Associated symptoms include coughing. Pertinent negatives include no congestion, headaches, numbness, sore throat or weakness. The symptoms are aggravated by standing (any kind of movement). She has tried nothing for the symptoms.   Patient is a 60 y/o female with history of back pain on chronic opioid therapy with a history of vertigo in 06/2015 presenting today with dizziness ongoing for seven days. She describes the dizziness as the room spinning. Aggravated by movement such as turning her head, walking, trying to get out of bed. No weakness, numbness, tingling, vision changes. No nausea. She presents in a wheelchair today because she is scared to walk. Normally ambulates with cane.  Allergies  Allergen Reactions  . Codeine Anaphylaxis  . Crestor [Rosuvastatin] Anaphylaxis  . Penicillins Anaphylaxis, Rash and Other (See Comments)    Reaction:  Tongue swelling  Has patient had a PCN reaction causing immediate rash, facial/tongue/throat swelling, SOB or lightheadedness with hypotension:  Yes   Has patient had a PCN reaction causing severe rash involving mucus membranes or skin necrosis: No Has patient had a PCN reaction that required hospitalization No Has patient had a PCN reaction occurring within the last 10 years: No If all of the above answers are "NO", then may proceed with Cephalosporin use.  . [place] Venom [Bee Venom] Anaphylaxis  . Trazodone And Nefazodone   . Gemfibrozil Rash  . Lipitor [Atorvastatin] Rash     Current Outpatient Prescriptions:  .  aspirin EC 81 MG tablet, Take 81 mg by mouth  at bedtime., Disp: , Rfl:  .  buPROPion (WELLBUTRIN SR) 150 MG 12 hr tablet, 1 tablet daily for 3 days, then 1 tablet twice daily. Stop smoking 14 days after starting medication, Disp: 60 tablet, Rfl: 3 .  Fluticasone-Salmeterol (ADVAIR DISKUS) 250-50 MCG/DOSE AEPB, Inhale 1 puff into the lungs 2 (two) times daily., Disp: 60 each, Rfl: 3 .  gemfibrozil (LOPID) 600 MG tablet, Take 1 tablet (600 mg total) by mouth 2 (two) times daily before a meal., Disp: 60 tablet, Rfl: 1 .  glucose blood test strip, Check blood sugar daily. Dx Type 2 diabetes E11.9, Disp: 100 each, Rfl: 3 .  levalbuterol (XOPENEX HFA) 45 MCG/ACT inhaler, Inhale 1-2 puffs into the lungs every 4 (four) hours as needed for wheezing or shortness of breath., Disp: 1 Inhaler, Rfl: 12 .  meloxicam (MOBIC) 15 MG tablet, Take 1 tablet (15 mg total) by mouth daily as needed for pain., Disp: 90 tablet, Rfl: 2 .  metFORMIN (GLUCOPHAGE XR) 500 MG 24 hr tablet, Take 2 tablets (1,000 mg total) by mouth 2 (two) times daily. With meals, Disp: 360 tablet, Rfl: 4 .  oxyCODONE-acetaminophen (PERCOCET) 10-325 MG tablet, 1 tablet every 4-6 hours as needed, Disp: 180 tablet, Rfl: 0 .  zolpidem (AMBIEN) 10 MG tablet, TAKE ONE TABLET BY MOUTH AT BEDTIME, Disp: 30 tablet, Rfl: 4 .  albuterol (PROVENTIL) (2.5 MG/3ML) 0.083% nebulizer solution, Take 3 mLs (2.5 mg total) by nebulization every 6 (six) hours as needed for wheezing or shortness of breath. (Patient not taking: Reported  on 12/08/2015), Disp: 150 mL, Rfl: 1  Review of Systems  HENT: Positive for postnasal drip, tinnitus (has been going on for three days.) and trouble swallowing. Negative for congestion, ear discharge, ear pain, hearing loss, nosebleeds, rhinorrhea, sinus pain, sinus pressure, sneezing, sore throat and voice change.   Eyes: Positive for itching. Negative for photophobia, pain, discharge, redness and visual disturbance.  Respiratory: Positive for cough. Negative for apnea, choking, chest  tightness, shortness of breath, wheezing and stridor.        Pt has COPD   Cardiovascular: Negative.   Gastrointestinal: Negative.   Allergic/Immunologic: Positive for environmental allergies.  Neurological: Positive for dizziness. Negative for tremors, seizures, syncope, facial asymmetry, speech difficulty, weakness, light-headedness, numbness and headaches.    Social History  Substance Use Topics  . Smoking status: Current Every Day Smoker    Packs/day: 0.75    Types: Cigarettes  . Smokeless tobacco: Not on file  . Alcohol use No   Objective:   BP (!) 122/58 (BP Location: Right Arm, Patient Position: Sitting, Cuff Size: Large)   Pulse 96   Temp 98.9 F (37.2 C) (Oral)   Resp 16   Physical Exam  Constitutional: She is oriented to person, place, and time. She appears well-developed and well-nourished. No distress.  Patient is in wheelchair, she is afraid to walk due to dizziness.  HENT:  Head: Normocephalic.  Right Ear: Tympanic membrane and external ear normal. No drainage. Tympanic membrane is not injected.  Left Ear: Tympanic membrane and external ear normal. No drainage. Tympanic membrane is not injected.  Mouth/Throat: Oropharynx is clear and moist. No oropharyngeal exudate.  Patient refuses Marye Round maneuver, she is uncomfortable with transferring to the exam table.  Eyes: EOM are normal. Pupils are equal, round, and reactive to light.  Pupils miotic bilaterally, patient on chronic opioid therapy. Pupils are reactive. No nystagmus.  Neck: Neck supple.  Cardiovascular: Normal rate and regular rhythm.   Pulmonary/Chest: Effort normal. No respiratory distress. She has wheezes. She has no rales.  Breath sounds coarse bilaterally, diffuse wheezes throughout all lung fields.   Lymphadenopathy:    She has no cervical adenopathy.  Neurological: She is alert and oriented to person, place, and time. No cranial nerve deficit.  Skin: Skin is warm and dry. She is not  diaphoretic.  Psychiatric: She has a normal mood and affect. Her behavior is normal.        Assessment & Plan:      Problem List Items Addressed This Visit    None    Visit Diagnoses    Vertigo    -  Primary   Relevant Medications   meclizine (ANTIVERT) 25 MG tablet     Patient is a 60 y/o female presenting with vertigo, likely benign paroxysmal positional vertigo. Will treat as above. Patient not nauseas and declines anti-emetic, but she may call back for some if this changes. Return precautions counseled.  Return if symptoms worsen or fail to improve.   Patient Instructions  Benign Positional Vertigo Vertigo is the feeling that you or your surroundings are moving when they are not. Benign positional vertigo is the most common form of vertigo. The cause of this condition is not serious (is benign). This condition is triggered by certain movements and positions (is positional). This condition can be dangerous if it occurs while you are doing something that could endanger you or others, such as driving.  CAUSES In many cases, the cause of this condition is not  known. It may be caused by a disturbance in an area of the inner ear that helps your brain to sense movement and balance. This disturbance can be caused by a viral infection (labyrinthitis), head injury, or repetitive motion. RISK FACTORS This condition is more likely to develop in:  Women.  People who are 44 years of age or older. SYMPTOMS Symptoms of this condition usually happen when you move your head or your eyes in different directions. Symptoms may start suddenly, and they usually last for less than a minute. Symptoms may include:  Loss of balance and falling.  Feeling like you are spinning or moving.  Feeling like your surroundings are spinning or moving.  Nausea and vomiting.  Blurred vision.  Dizziness.  Involuntary eye movement (nystagmus). Symptoms can be mild and cause only slight annoyance, or they  can be severe and interfere with daily life. Episodes of benign positional vertigo may return (recur) over time, and they may be triggered by certain movements. Symptoms may improve over time. DIAGNOSIS This condition is usually diagnosed by medical history and a physical exam of the head, neck, and ears. You may be referred to a health care provider who specializes in ear, nose, and throat (ENT) problems (otolaryngologist) or a provider who specializes in disorders of the nervous system (neurologist). You may have additional testing, including:  MRI.  A CT scan.  Eye movement tests. Your health care provider may ask you to change positions quickly while he or she watches you for symptoms of benign positional vertigo, such as nystagmus. Eye movement may be tested with an electronystagmogram (ENG), caloric stimulation, the Dix-Hallpike test, or the roll test.  An electroencephalogram (EEG). This records electrical activity in your brain.  Hearing tests. TREATMENT Usually, your health care provider will treat this by moving your head in specific positions to adjust your inner ear back to normal. Surgery may be needed in severe cases, but this is rare. In some cases, benign positional vertigo may resolve on its own in 2-4 weeks. HOME CARE INSTRUCTIONS Safety  Move slowly.Avoid sudden body or head movements.  Avoid driving.  Avoid operating heavy machinery.  Avoid doing any tasks that would be dangerous to you or others if a vertigo episode would occur.  If you have trouble walking or keeping your balance, try using a cane for stability. If you feel dizzy or unstable, sit down right away.  Return to your normal activities as told by your health care provider. Ask your health care provider what activities are safe for you. General Instructions  Take over-the-counter and prescription medicines only as told by your health care provider.  Avoid certain positions or movements as told by your  health care provider.  Drink enough fluid to keep your urine clear or pale yellow.  Keep all follow-up visits as told by your health care provider. This is important. SEEK MEDICAL CARE IF:  You have a fever.  Your condition gets worse or you develop new symptoms.  Your family or friends notice any behavioral changes.  Your nausea or vomiting gets worse.  You have numbness or a "pins and needles" sensation. SEEK IMMEDIATE MEDICAL CARE IF:  You have difficulty speaking or moving.  You are always dizzy.  You faint.  You develop severe headaches.  You have weakness in your legs or arms.  You have changes in your hearing or vision.  You develop a stiff neck.  You develop sensitivity to light.   This information is not  intended to replace advice given to you by your health care provider. Make sure you discuss any questions you have with your health care provider.   Document Released: 10/22/2005 Document Revised: 10/05/2014 Document Reviewed: 05/09/2014 Elsevier Interactive Patient Education [employer].    The entirety of the information documented in the History of Present Illness, Review of Systems and Physical Exam were personally obtained by me. Portions of this information were initially documented by Ashley Royalty, CMA and reviewed by me for thoroughness and accuracy.       Trinna Post, PA-C  [facility]

## 2015-12-08 NOTE — Patient Instructions (Signed)
Benign Positional Vertigo Vertigo is the feeling that you or your surroundings are moving when they are not. Benign positional vertigo is the most common form of vertigo. The cause of this condition is not serious (is benign). This condition is triggered by certain movements and positions (is positional). This condition can be dangerous if it occurs while you are doing something that could endanger you or others, such as driving.  CAUSES In many cases, the cause of this condition is not known. It may be caused by a disturbance in an area of the inner ear that helps your brain to sense movement and balance. This disturbance can be caused by a viral infection (labyrinthitis), head injury, or repetitive motion. RISK FACTORS This condition is more likely to develop in:  Women.  People who are 50 years of age or older. SYMPTOMS Symptoms of this condition usually happen when you move your head or your eyes in different directions. Symptoms may start suddenly, and they usually last for less than a minute. Symptoms may include:  Loss of balance and falling.  Feeling like you are spinning or moving.  Feeling like your surroundings are spinning or moving.  Nausea and vomiting.  Blurred vision.  Dizziness.  Involuntary eye movement (nystagmus). Symptoms can be mild and cause only slight annoyance, or they can be severe and interfere with daily life. Episodes of benign positional vertigo may return (recur) over time, and they may be triggered by certain movements. Symptoms may improve over time. DIAGNOSIS This condition is usually diagnosed by medical history and a physical exam of the head, neck, and ears. You may be referred to a health care provider who specializes in ear, nose, and throat (ENT) problems (otolaryngologist) or a provider who specializes in disorders of the nervous system (neurologist). You may have additional testing, including:  MRI.  A CT scan.  Eye movement tests. Your  health care provider may ask you to change positions quickly while he or she watches you for symptoms of benign positional vertigo, such as nystagmus. Eye movement may be tested with an electronystagmogram (ENG), caloric stimulation, the Dix-Hallpike test, or the roll test.  An electroencephalogram (EEG). This records electrical activity in your brain.  Hearing tests. TREATMENT Usually, your health care provider will treat this by moving your head in specific positions to adjust your inner ear back to normal. Surgery may be needed in severe cases, but this is rare. In some cases, benign positional vertigo may resolve on its own in 2-4 weeks. HOME CARE INSTRUCTIONS Safety  Move slowly.Avoid sudden body or head movements.  Avoid driving.  Avoid operating heavy machinery.  Avoid doing any tasks that would be dangerous to you or others if a vertigo episode would occur.  If you have trouble walking or keeping your balance, try using a cane for stability. If you feel dizzy or unstable, sit down right away.  Return to your normal activities as told by your health care provider. Ask your health care provider what activities are safe for you. General Instructions  Take over-the-counter and prescription medicines only as told by your health care provider.  Avoid certain positions or movements as told by your health care provider.  Drink enough fluid to keep your urine clear or pale yellow.  Keep all follow-up visits as told by your health care provider. This is important. SEEK MEDICAL CARE IF:  You have a fever.  Your condition gets worse or you develop new symptoms.  Your family or friends   notice any behavioral changes.  Your nausea or vomiting gets worse.  You have numbness or a "pins and needles" sensation. SEEK IMMEDIATE MEDICAL CARE IF:  You have difficulty speaking or moving.  You are always dizzy.  You faint.  You develop severe headaches.  You have weakness in your  legs or arms.  You have changes in your hearing or vision.  You develop a stiff neck.  You develop sensitivity to light.   This information is not intended to replace advice given to you by your health care provider. Make sure you discuss any questions you have with your health care provider.   Document Released: 10/22/2005 Document Revised: 10/05/2014 Document Reviewed: 05/09/2014 Elsevier Interactive Patient Education 2016 Elsevier Inc.  

## 2015-12-19 ENCOUNTER — Ambulatory Visit: Payer: Medicare Other

## 2015-12-25 ENCOUNTER — Other Ambulatory Visit: Payer: Self-pay | Admitting: Family Medicine

## 2015-12-25 ENCOUNTER — Telehealth: Payer: Self-pay | Admitting: Physician Assistant

## 2015-12-25 ENCOUNTER — Telehealth: Payer: Self-pay | Admitting: *Deleted

## 2015-12-25 ENCOUNTER — Other Ambulatory Visit: Payer: Self-pay | Admitting: Physician Assistant

## 2015-12-25 DIAGNOSIS — R42 Dizziness and giddiness: Secondary | ICD-10-CM

## 2015-12-25 DIAGNOSIS — R270 Ataxia, unspecified: Secondary | ICD-10-CM

## 2015-12-25 DIAGNOSIS — H9313 Tinnitus, bilateral: Secondary | ICD-10-CM

## 2015-12-25 MED ORDER — OXYCODONE-ACETAMINOPHEN 10-325 MG PO TABS: ORAL_TABLET | ORAL | 0 refills | Status: DC

## 2015-12-25 NOTE — Telephone Encounter (Signed)
Spoke with Dr. Caryn Section, who has scheduled CT for ringing in ears. There is not an Rx for this that I would prescribe. Can refer patient to ENT if she wishes to have this further evaluated.

## 2015-12-25 NOTE — Telephone Encounter (Signed)
Patient was advised she declined referral to ENT stating that she will wait till she has CT done to see if there was any findings. KW

## 2015-12-25 NOTE — Telephone Encounter (Signed)
rx is ready. If dizziness is not better hen she needs CT head with and without contrast for dizziness, ataxia. Schedule follow up office visit one day after head CT.

## 2015-12-25 NOTE — Telephone Encounter (Signed)
Pt needs refill on her pain med  oxyCODONE-acetaminophen (PERCOCET) 10-325 MG   She is also still having ringing in her ears..  The medication is not helping that [place] prescribed.  Please advise.  Thanks, [employer]

## 2015-12-25 NOTE — Telephone Encounter (Signed)
Pt is requesting something be sent to [facility] on [address] for ringing in her ears.She states this is an all day thing and is also keeping her awake at night

## 2015-12-25 NOTE — Telephone Encounter (Signed)
Please schedule CT Head? Thanks!

## 2015-12-25 NOTE — Telephone Encounter (Signed)
Patient was notified. Patient is agreeable to have CT scan.

## 2015-12-25 NOTE — Telephone Encounter (Signed)
Patient called office wanting to know If she can have a referral to ENT also? Patient stated that the ringing in her ear is so hard she can hardly hear people speaking to her. Please advise?

## 2015-12-25 NOTE — Telephone Encounter (Signed)
Please review. Thanks! Patient was treated on 12/08/15 for vertigo and she was prescribed meclizine. She reports that the medication has not helped with her dizziness.

## 2015-12-25 NOTE — Telephone Encounter (Signed)
Please refer ENT ASAP for tinnitus, hearing loss and dizziness.

## 2015-12-26 ENCOUNTER — Ambulatory Visit: Payer: Medicare Other | Admitting: Family Medicine

## 2015-12-28 ENCOUNTER — Telehealth: Payer: Self-pay | Admitting: Family Medicine

## 2015-12-28 ENCOUNTER — Ambulatory Visit
Admission: RE | Admit: 2015-12-28 | Discharge: 2015-12-28 | Disposition: A | Discharge status: Home / Self Care | Payer: Medicare Other | Source: Ambulatory Visit | Attending: Family Medicine | Admitting: Family Medicine

## 2015-12-28 DIAGNOSIS — R270 Ataxia, unspecified: Secondary | ICD-10-CM | POA: Diagnosis not present

## 2015-12-28 DIAGNOSIS — J323 Chronic sphenoidal sinusitis: Secondary | ICD-10-CM | POA: Diagnosis not present

## 2015-12-28 DIAGNOSIS — R42 Dizziness and giddiness: Secondary | ICD-10-CM

## 2015-12-28 DIAGNOSIS — H9313 Tinnitus, bilateral: Secondary | ICD-10-CM | POA: Diagnosis not present

## 2015-12-28 LAB — POCT I-STAT CREATININE: Creatinine, Ser: 0.5 mg/dL (ref 0.44–1.00)

## 2015-12-28 MED ORDER — IOPAMIDOL (ISOVUE-300) INJECTION 61%
75.0000 mL | Freq: Once | INTRAVENOUS | Status: AC | PRN
  Administered 2015-12-28: 75 mL via INTRAVENOUS

## 2015-12-28 NOTE — Telephone Encounter (Signed)
Pt is scheduled for CT with contrast this afternoon at 3 pm. Pt stated the imaging center advised her that she couldn't eat today after 11 am. Pt stated she just woke up and hasn't ate breakfast or taking her medications and she can't do that before 11 am. Pt would like to know if she can do the CT without contrast and still see what Dr. Caryn Section is needing. Please advise. Thanks TNP

## 2015-12-28 NOTE — Telephone Encounter (Signed)
No she it needs to be done with contrast. She can take her medications with water, but nothing else.

## 2015-12-28 NOTE — Telephone Encounter (Signed)
Patient was notified. Expressed understanding.  

## 2015-12-28 NOTE — Telephone Encounter (Signed)
Please advise 

## 2015-12-29 ENCOUNTER — Telehealth: Payer: Self-pay

## 2015-12-29 ENCOUNTER — Telehealth: Payer: Self-pay | Admitting: Physician Assistant

## 2015-12-29 DIAGNOSIS — R42 Dizziness and giddiness: Secondary | ICD-10-CM

## 2015-12-29 MED ORDER — MECLIZINE HCL 25 MG PO TABS: 25.0000 mg | ORAL_TABLET | Freq: Three times a day (TID) | ORAL | 0 refills | Status: DC | PRN

## 2015-12-29 NOTE — Telephone Encounter (Signed)
Refilled meclizine.

## 2015-12-29 NOTE — Telephone Encounter (Signed)
[id number] 

## 2015-12-29 NOTE — Telephone Encounter (Signed)
Please review-aa 

## 2015-12-29 NOTE — Telephone Encounter (Signed)
-----   Message from Birdie Sons, MD sent at 12/29/2015  3:01 PM EST ----- CT shows sinus infection which could be causing dizziness. Recommend she start levofloxacin '750mg'$  daily for 7 days. If dizziness is not better when finished with antibiotic then she will need to see ENT.

## 2015-12-29 NOTE — Telephone Encounter (Signed)
Pt contacted office for refill request on the following medications:  meclizine (ANTIVERT) 25 MG tablet.  [place]  SW#[phone]/[place]

## 2016-01-03 MED ORDER — LEVOFLOXACIN 500 MG PO TABS: 500.0000 mg | ORAL_TABLET | Freq: Every day | ORAL | 0 refills | Status: DC

## 2016-01-03 NOTE — Telephone Encounter (Signed)
Advised patient as below. Medication was sent into the pharmacy. Patient was advised to call if not improving.

## 2016-01-10 ENCOUNTER — Encounter: Payer: Self-pay | Admitting: Family Medicine

## 2016-01-15 DIAGNOSIS — J439 Emphysema, unspecified: Secondary | ICD-10-CM | POA: Diagnosis not present

## 2016-01-15 DIAGNOSIS — Z8639 Personal history of other endocrine, nutritional and metabolic disease: Secondary | ICD-10-CM | POA: Diagnosis not present

## 2016-01-15 DIAGNOSIS — R0609 Other forms of dyspnea: Secondary | ICD-10-CM | POA: Diagnosis not present

## 2016-01-15 DIAGNOSIS — J310 Chronic rhinitis: Secondary | ICD-10-CM | POA: Diagnosis not present

## 2016-01-17 DIAGNOSIS — R0600 Dyspnea, unspecified: Secondary | ICD-10-CM | POA: Diagnosis not present

## 2016-01-18 DIAGNOSIS — R0600 Dyspnea, unspecified: Secondary | ICD-10-CM | POA: Diagnosis not present

## 2016-01-23 ENCOUNTER — Telehealth: Payer: Self-pay | Admitting: Family Medicine

## 2016-01-23 MED ORDER — OXYCODONE-ACETAMINOPHEN 10-325 MG PO TABS: ORAL_TABLET | ORAL | 0 refills | Status: DC

## 2016-01-23 NOTE — Telephone Encounter (Signed)
Pt needs refill on her   oxyCODONE-acetaminophen (PERCOCET) 10-325 MG   Thanks, [employer]

## 2016-01-23 NOTE — Telephone Encounter (Signed)
Dr Caryn Section please review Thanks ED

## 2016-02-08 DIAGNOSIS — J449 Chronic obstructive pulmonary disease, unspecified: Secondary | ICD-10-CM | POA: Diagnosis not present

## 2016-02-08 DIAGNOSIS — R05 Cough: Secondary | ICD-10-CM | POA: Diagnosis not present

## 2016-02-08 DIAGNOSIS — J439 Emphysema, unspecified: Secondary | ICD-10-CM | POA: Diagnosis not present

## 2016-02-08 DIAGNOSIS — R0609 Other forms of dyspnea: Secondary | ICD-10-CM | POA: Diagnosis not present

## 2016-02-10 ENCOUNTER — Other Ambulatory Visit: Payer: Self-pay | Admitting: Physician Assistant

## 2016-02-10 DIAGNOSIS — R42 Dizziness and giddiness: Secondary | ICD-10-CM

## 2016-02-13 NOTE — Telephone Encounter (Signed)
Pt called to see if the Rx for meclizine (ANTIVERT) 25 MG tablet had been sent to [facility]. Pt stated that Fabio Bering is the provider that started her on the medication. Please advise. Thanks TNP

## 2016-02-13 NOTE — Telephone Encounter (Signed)
Pt called regarding her refill on her   meclizine (ANTIVERT) 25 MG tablet  She says she still has dizziness.  [facility] [address]  Pt's call back [phone]  Thanks, [employer]

## 2016-02-13 NOTE — Telephone Encounter (Signed)
Pt called regarding her rx refill.  Thank sTeri

## 2016-02-16 NOTE — Telephone Encounter (Signed)
Spoke with Dr. Caryn Section, he wold like you to route this prescription to him.

## 2016-02-16 NOTE — Telephone Encounter (Signed)
Please review-aa 

## 2016-02-19 ENCOUNTER — Telehealth: Payer: Self-pay | Admitting: Family Medicine

## 2016-02-19 ENCOUNTER — Encounter: Payer: Self-pay | Admitting: Family Medicine

## 2016-02-19 ENCOUNTER — Ambulatory Visit (INDEPENDENT_AMBULATORY_CARE_PROVIDER_SITE_OTHER): Payer: Medicare Other | Admitting: Family Medicine

## 2016-02-19 VITALS — BP 104/54 | HR 98 | Temp 98.9°F | Resp 18 | Ht 59.0 in | Wt 179.0 lb

## 2016-02-19 DIAGNOSIS — I10 Essential (primary) hypertension: Secondary | ICD-10-CM | POA: Diagnosis not present

## 2016-02-19 DIAGNOSIS — E119 Type 2 diabetes mellitus without complications: Secondary | ICD-10-CM

## 2016-02-19 DIAGNOSIS — M25531 Pain in right wrist: Secondary | ICD-10-CM | POA: Diagnosis not present

## 2016-02-19 DIAGNOSIS — L304 Erythema intertrigo: Secondary | ICD-10-CM | POA: Diagnosis not present

## 2016-02-19 DIAGNOSIS — E781 Pure hyperglyceridemia: Secondary | ICD-10-CM | POA: Diagnosis not present

## 2016-02-19 DIAGNOSIS — M545 Low back pain: Secondary | ICD-10-CM | POA: Diagnosis not present

## 2016-02-19 DIAGNOSIS — G8929 Other chronic pain: Secondary | ICD-10-CM | POA: Diagnosis not present

## 2016-02-19 DIAGNOSIS — J449 Chronic obstructive pulmonary disease, unspecified: Secondary | ICD-10-CM | POA: Diagnosis not present

## 2016-02-19 DIAGNOSIS — H9313 Tinnitus, bilateral: Secondary | ICD-10-CM

## 2016-02-19 DIAGNOSIS — F17210 Nicotine dependence, cigarettes, uncomplicated: Secondary | ICD-10-CM | POA: Diagnosis not present

## 2016-02-19 LAB — POCT GLYCOSYLATED HEMOGLOBIN (HGB A1C)
ESTIMATED AVERAGE GLUCOSE: 126
HEMOGLOBIN A1C: 6

## 2016-02-19 MED ORDER — METFORMIN HCL ER 500 MG PO TB24: 1000.0000 mg | ORAL_TABLET | Freq: Two times a day (BID) | ORAL | 4 refills | Status: DC

## 2016-02-19 MED ORDER — OXYCODONE-ACETAMINOPHEN 10-325 MG PO TABS: ORAL_TABLET | ORAL | 0 refills | Status: DC

## 2016-02-19 MED ORDER — CLOTRIMAZOLE-BETAMETHASONE 1-0.05 % EX CREA: 1.0000 "application " | TOPICAL_CREAM | Freq: Two times a day (BID) | CUTANEOUS | 3 refills | Status: DC

## 2016-02-19 NOTE — Telephone Encounter (Signed)
Pharmacy called saying the insurance will not pay for the Lotrisone.  Will you choose another RX and call them back  Thanks, teri

## 2016-02-19 NOTE — Patient Instructions (Addendum)
   You can take OTC Multivitamin once a day   You can also take a daily B-Complex once a day

## 2016-02-19 NOTE — Progress Notes (Signed)
Patient: Yvette Riley Female    DOB: 21-Oct-1955   61 y.o.   MRN: [id number] Visit Date: 02/19/2016  Today's Provider: Lelon Huh, MD   Chief Complaint  Patient presents with  . Follow-up  . Diabetes  . Hypertension  . Hyperlipidemia  . COPD   Subjective:    HPI  Chronic obstructive pulmonary disease, unspecified COPD type ([place]) From 11/13/2015-Extensive counseling on asthma, emphysema, and COPD. She is scheduled for follow up Dr. Raul Del regarding.Anoro. Strongly encouraged to stop smoking Has since been prescribed fluticasone, but still has Qvar and is not sure if she is supposed to take both.    Diabetes Mellitus Type II, Follow-up:   Lab Results  Component Value Date   HGBA1C 6.0 02/19/2016   HGBA1C 7.2 11/13/2015   HGBA1C 7.0 07/13/2015   Last seen for diabetes 3 months ago.  Management since then includes; no changes, advised to improve diet. She reports good compliance with treatment. She is not having side effects. none Current symptoms include none and have been unchanged. Home blood sugar records: fasting range: 110-125  Episodes of hypoglycemia? no   Current Insulin Regimen: n/a Most Recent Eye Exam: due Weight trend: stable Prior visit with dietician: no Current diet: in general, an "unhealthy" diet Current exercise: none  ----------------------------------------------------------------   Hypertension, follow-up:  BP Readings from Last 3 Encounters:  02/19/16 (!) 104/54  12/08/15 (!) 122/58  11/13/15 112/80    She was last seen for hypertension 3 months ago.  BP at that visit was 112/80. Management since that visit includes; no changes.She reports good compliance with treatment. She is not having side effects. none She is not exercising. She is not adherent to low salt diet.   Outside blood pressures are not checking. She is experiencing none.  Patient denies none.   Cardiovascular risk factors include diabetes mellitus and  dyslipidemia.  Use of agents associated with hypertension: none.   ----------------------------------------------------------------    Lipid/Cholesterol, Follow-up:   Last seen for this 3 months ago.  Management since that visit includes; no changes.  Last Lipid Panel: No results found for: CHOL, TRIG, HDL, CHOLHDL, VLDL, LDLCALC, LDLDIRECT  She reports good compliance with treatment. She is not having side effects. none  Wt Readings from Last 3 Encounters:  02/19/16 179 lb (81.2 kg)  11/13/15 192 lb (87.1 kg)  10/10/15 190 lb (86.2 kg)    ----------------------------------------------------------------  Complains of right wrist pain for several months. No specific injury. Was wearing wrist brace but not for the last few months.   She also has rash in midline vertical surgical fold of lower abdomen which is very sore. Tried several OTC creams which didn't help.   She also complains of persistent ringing in both ears and having more and more trouble hearing.   Allergies  Allergen Reactions  . Codeine Anaphylaxis  . Crestor [Rosuvastatin] Anaphylaxis  . Penicillins Anaphylaxis, Rash and Other (See Comments)    Reaction:  Tongue swelling  Has patient had a PCN reaction causing immediate rash, facial/tongue/throat swelling, SOB or lightheadedness with hypotension:  Yes   Has patient had a PCN reaction causing severe rash involving mucus membranes or skin necrosis: No Has patient had a PCN reaction that required hospitalization No Has patient had a PCN reaction occurring within the last 10 years: No If all of the above answers are "NO", then may proceed with Cephalosporin use.  . [place] Venom [Bee Venom] Anaphylaxis  . Trazodone  And Nefazodone   . Gemfibrozil Rash  . Lipitor [Atorvastatin] Rash     Current Outpatient Prescriptions:  .  albuterol (PROVENTIL) (2.5 MG/3ML) 0.083% nebulizer solution, Take 3 mLs (2.5 mg total) by nebulization every 6 (six) hours as  needed for wheezing or shortness of breath., Disp: 150 mL, Rfl: 1 .  aspirin EC 81 MG tablet, Take 81 mg by mouth at bedtime., Disp: , Rfl:  .  buPROPion (WELLBUTRIN SR) 150 MG 12 hr tablet, 1 tablet daily for 3 days, then 1 tablet twice daily. Stop smoking 14 days after starting medication, Disp: 60 tablet, Rfl: 3 .  Fluticasone-Salmeterol (ADVAIR DISKUS) 250-50 MCG/DOSE AEPB, Inhale 1 puff into the lungs 2 (two) times daily., Disp: 60 each, Rfl: 3 .  gemfibrozil (LOPID) 600 MG tablet, Take 1 tablet (600 mg total) by mouth 2 (two) times daily before a meal., Disp: 60 tablet, Rfl: 1 .  glucose blood test strip, Check blood sugar daily. Dx Type 2 diabetes E11.9, Disp: 100 each, Rfl: 3 .  levalbuterol (XOPENEX HFA) 45 MCG/ACT inhaler, Inhale 1-2 puffs into the lungs every 4 (four) hours as needed for wheezing or shortness of breath., Disp: 1 Inhaler, Rfl: 12 .  meclizine (ANTIVERT) 25 MG tablet, TAKE ONE TABLET BY MOUTH THREE TIMES DAILY AS NEEDED FOR DIZZINESS, Disp: 30 tablet, Rfl: 5 .  meloxicam (MOBIC) 15 MG tablet, Take 1 tablet (15 mg total) by mouth daily as needed for pain., Disp: 90 tablet, Rfl: 2 .  metFORMIN (GLUCOPHAGE XR) 500 MG 24 hr tablet, Take 2 tablets (1,000 mg total) by mouth 2 (two) times daily. With meals, Disp: 360 tablet, Rfl: 4 .  oxyCODONE-acetaminophen (PERCOCET) 10-325 MG tablet, 1 tablet every 4-6 hours as needed, Disp: 180 tablet, Rfl: 0 .  zolpidem (AMBIEN) 10 MG tablet, TAKE ONE TABLET BY MOUTH AT BEDTIME, Disp: 30 tablet, Rfl: 4  Review of Systems  Constitutional: Negative for appetite change, chills, fatigue and fever.  HENT: Positive for tinnitus.   Respiratory: Negative for chest tightness and shortness of breath.   Cardiovascular: Negative for chest pain and palpitations.  Gastrointestinal: Negative for abdominal pain, nausea and vomiting.  Neurological: Positive for dizziness and light-headedness. Negative for weakness.    Social History  Substance Use  Topics  . Smoking status: Current Every Day Smoker    Packs/day: 0.75    Types: Cigarettes  . Smokeless tobacco: Never Used  . Alcohol use No   Objective:   BP (!) 104/54 (BP Location: Left Arm, Patient Position: Sitting, Cuff Size: Large)   Pulse 98   Temp 98.9 F (37.2 C) (Oral)   Resp 18   Ht '4\' 11"'$  (1.499 m)   Wt 179 lb (81.2 kg)   LMP  (Exact Date) Comment: 10 yrs ago  SpO2 94%   BMI 36.15 kg/m   Physical Exam  General Appearance:    Alert, cooperative, no distress, obese  Eyes:    PERRL, conjunctiva/corneas clear, EOM's intact     Ear canals clear.   Lungs:     Clear to auscultation bilaterally, occasional wheezes, respirations unlabored  Heart:    Regular rate and rhythm  Neurologic:   Awake, alert, oriented x 3. No apparent focal neurological           defect.   Skin:  Vertical midline surgical fold below umbilicus is red and irritate with scant yellow discharge.    Results for orders placed or performed in visit on 02/19/16  POCT glycosylated  hemoglobin (Hb A1C)  Result Value Ref Range   Hemoglobin A1C 6.0    Est. average glucose Bld gHb Est-mCnc 126        Assessment & Plan:      1. Type 2 diabetes mellitus without complication, without long-term current use of insulin (HCC) Stable. Continue current diet regiment and metformin which she is tolerating well.  - POCT glycosylated hemoglobin (Hb A1C)  2. Essential hypertension BP has been very well controlled, off medication.   3. Chronic obstructive pulmonary disease, unspecified COPD type ([place]) Stable. Counseled that she only needs to take one steroid inhaler, either fluticasone or Qvar, but not both. Continue routine follow up Dr. Raul Del.   4. Hyperglyceridemia, pure Doing well on gemfibrozil. Check lipids at next follow up.   5. Smoking greater than 30 pack years Counseled on risk/benefits of lung cancer screening. Which she would like to proceed with. Counseled regarding benefits of smoking cessation  and prescription medications available to help stop smoking. She feels she is making good progress without medications at this point.  - CT CHEST LUNG CA SCREEN LOW DOSE W/O CM; Future  6. Tinnitus of both ears  - Ambulatory referral to ENT  7. Right wrist pain likeley chronic tendonitis  8. Intertrigo.  Lotrisone cream to affected area in surgical fold of lower abdomen      Addressed extensive list of chronic and acute medical problems today requiring extensive time in counseling and coordination care.  Over half of this 45 minute visit were spent in counseling and coordinating care of multiple medical problems.   The entirety of the information documented in the History of Present Illness, Review of Systems and Physical Exam were personally obtained by me. Portions of this information were initially documented by April M. Sabra Heck, CMA and reviewed by me for thoroughness and accuracy.   Lelon Huh, MD  [facility]

## 2016-02-19 NOTE — Telephone Encounter (Signed)
Please review. Thanks!  

## 2016-02-20 ENCOUNTER — Telehealth: Payer: Self-pay | Admitting: *Deleted

## 2016-02-20 ENCOUNTER — Telehealth: Payer: Self-pay | Admitting: Family Medicine

## 2016-02-20 DIAGNOSIS — Z87891 Personal history of nicotine dependence: Secondary | ICD-10-CM

## 2016-02-20 MED ORDER — KETOCONAZOLE 2 % EX CREA: 1.0000 "application " | TOPICAL_CREAM | Freq: Every day | CUTANEOUS | 0 refills | Status: DC

## 2016-02-20 NOTE — Telephone Encounter (Signed)
Received referral for initial lung cancer screening scan. Contacted patient and obtained smoking history,(current, 90 pack year) as well as answering questions related to screening process. Patient denies signs of lung cancer such as weight loss or hemoptysis. Patient denies comorbidity that would prevent curative treatment if lung cancer were found. Patient is tentatively scheduled for shared decision making visit and CT scan on 02/27/16 at 1:30pm, pending insurance approval from business office.

## 2016-02-20 NOTE — Telephone Encounter (Signed)
FYI--We have tried twice to refer pt to [facility] ENT.Their office will not set up appointment until she speaks to billing department.Pt has been advised and given contact information

## 2016-02-27 ENCOUNTER — Ambulatory Visit
Admission: RE | Admit: 2016-02-27 | Discharge: 2016-02-27 | Disposition: A | Discharge status: Home / Self Care | Payer: Medicare Other | Source: Ambulatory Visit | Attending: Oncology | Admitting: Oncology

## 2016-02-27 ENCOUNTER — Encounter: Payer: Self-pay | Admitting: Oncology

## 2016-02-27 ENCOUNTER — Inpatient Hospital Stay: Payer: Medicare Other | Attending: Oncology | Admitting: Oncology

## 2016-02-27 DIAGNOSIS — Z87891 Personal history of nicotine dependence: Secondary | ICD-10-CM

## 2016-02-27 DIAGNOSIS — F17200 Nicotine dependence, unspecified, uncomplicated: Secondary | ICD-10-CM | POA: Diagnosis not present

## 2016-02-27 DIAGNOSIS — I700 Atherosclerosis of aorta: Secondary | ICD-10-CM | POA: Insufficient documentation

## 2016-02-27 DIAGNOSIS — F17210 Nicotine dependence, cigarettes, uncomplicated: Secondary | ICD-10-CM | POA: Diagnosis not present

## 2016-02-27 DIAGNOSIS — Z122 Encounter for screening for malignant neoplasm of respiratory organs: Secondary | ICD-10-CM

## 2016-02-27 DIAGNOSIS — I2510 Atherosclerotic heart disease of native coronary artery without angina pectoris: Secondary | ICD-10-CM | POA: Diagnosis not present

## 2016-02-27 DIAGNOSIS — R918 Other nonspecific abnormal finding of lung field: Secondary | ICD-10-CM | POA: Diagnosis not present

## 2016-02-27 DIAGNOSIS — R599 Enlarged lymph nodes, unspecified: Secondary | ICD-10-CM | POA: Insufficient documentation

## 2016-02-28 ENCOUNTER — Other Ambulatory Visit: Payer: Self-pay | Admitting: *Deleted

## 2016-02-28 DIAGNOSIS — Z87891 Personal history of nicotine dependence: Principal | ICD-10-CM

## 2016-02-28 NOTE — Progress Notes (Signed)
In accordance with CMS guidelines, patient has met eligibility criteria including age, absence of signs or symptoms of lung cancer.  Social History  Substance Use Topics  . Smoking status: Current Every Day Smoker    Packs/day: 2.00    Years: 45.00    Types: Cigarettes  . Smokeless tobacco: Never Used     Comment: previously smoked 2 1/2 ppd for 30 years.   . Alcohol use No     A shared decision-making session was conducted prior to the performance of CT scan. This includes one or more decision aids, includes benefits and harms of screening, follow-up diagnostic testing, over-diagnosis, false positive rate, and total radiation exposure.  Counseling on the importance of adherence to annual lung cancer LDCT screening, impact of co-morbidities, and ability or willingness to undergo diagnosis and treatment is imperative for compliance of the program.  Counseling on the importance of continued smoking cessation for former smokers; the importance of smoking cessation for current smokers, and information about tobacco cessation interventions have been given to patient including [place] and [facility] quit [place] programs.  Written order for lung cancer screening with LDCT has been given to the patient and any and all questions have been answered to the best of my abilities.   Yearly follow up will be coordinated by Burgess Estelle, Thoracic Navigator.

## 2016-02-29 ENCOUNTER — Encounter: Payer: Self-pay | Admitting: Family Medicine

## 2016-02-29 ENCOUNTER — Telehealth: Payer: Self-pay | Admitting: *Deleted

## 2016-02-29 DIAGNOSIS — I2510 Atherosclerotic heart disease of native coronary artery without angina pectoris: Secondary | ICD-10-CM | POA: Insufficient documentation

## 2016-02-29 NOTE — Telephone Encounter (Signed)
Patient's case discussed in multidisciplinary thoracic conference with the recommendation for PET scan and EBUS. Contacted PCP office and discussed with Dr. Rosanna Randy who agreed with medical oncology consult and plan described. Patient contacted and given appointment for tomorrow 03/01/16 at 2:30pm at [facility] location. Verbalizes understanding.

## 2016-03-01 ENCOUNTER — Inpatient Hospital Stay: Payer: Medicare Other | Attending: Oncology | Admitting: Oncology

## 2016-03-01 ENCOUNTER — Encounter: Payer: Self-pay | Admitting: Oncology

## 2016-03-01 VITALS — BP 130/76 | HR 96 | Temp 99.1°F | Wt 175.2 lb

## 2016-03-01 DIAGNOSIS — E119 Type 2 diabetes mellitus without complications: Secondary | ICD-10-CM | POA: Diagnosis not present

## 2016-03-01 DIAGNOSIS — R918 Other nonspecific abnormal finding of lung field: Secondary | ICD-10-CM | POA: Insufficient documentation

## 2016-03-01 DIAGNOSIS — R05 Cough: Secondary | ICD-10-CM | POA: Diagnosis not present

## 2016-03-01 DIAGNOSIS — J449 Chronic obstructive pulmonary disease, unspecified: Secondary | ICD-10-CM | POA: Diagnosis not present

## 2016-03-01 DIAGNOSIS — Z7984 Long term (current) use of oral hypoglycemic drugs: Secondary | ICD-10-CM | POA: Insufficient documentation

## 2016-03-01 DIAGNOSIS — F419 Anxiety disorder, unspecified: Secondary | ICD-10-CM | POA: Diagnosis not present

## 2016-03-01 DIAGNOSIS — Z79899 Other long term (current) drug therapy: Secondary | ICD-10-CM | POA: Insufficient documentation

## 2016-03-01 DIAGNOSIS — F17210 Nicotine dependence, cigarettes, uncomplicated: Secondary | ICD-10-CM

## 2016-03-01 DIAGNOSIS — Z7982 Long term (current) use of aspirin: Secondary | ICD-10-CM | POA: Diagnosis not present

## 2016-03-01 DIAGNOSIS — I700 Atherosclerosis of aorta: Secondary | ICD-10-CM | POA: Diagnosis not present

## 2016-03-01 NOTE — Progress Notes (Signed)
Patient ambulates with a cane, accompanied by her son and daughter in law.  O2 98% on room air.  Vitals documented

## 2016-03-01 NOTE — Progress Notes (Signed)
[place]  [phone] [phone]  ID: Yvette Riley OB: [birth year]  MR#: [id number]  CMK#:[id number]  Patient Care Team: Birdie Sons, MD as PCP - General (Family Medicine) Dionisio David, MD (Cardiology)  CHIEF COMPLAINT: Mass of the upper lobe of the right lung  INTERVAL HISTORY: Patient is a 61 year old female who recently underwent CT screening for lung cancer and found to have a suspicious lesion in her right upper lobe. She is highly anxious, but otherwise feels well. She has a chronic cough that is unchanged. She has no neurologic complaints. She denies any recent fevers or illnesses. She has a good appetite and denies weight loss. She has no chest pain, shortness of breath, or hemoptysis. She denies any nausea, vomiting, constipation, or diarrhea. She has no urinary complaints. Patient otherwise feels well and offers no further specific complaints.  REVIEW OF SYSTEMS:   Review of Systems  Constitutional: Negative.  Negative for fever, malaise/fatigue and weight loss.  Respiratory: Positive for cough. Negative for hemoptysis and shortness of breath.   Cardiovascular: Negative.  Negative for chest pain and leg swelling.  Gastrointestinal: Negative.  Negative for abdominal pain.  Genitourinary: Negative.   Musculoskeletal: Negative.   Neurological: Negative.  Negative for weakness.  Psychiatric/Behavioral: The patient is nervous/anxious.     As per HPI. Otherwise, a complete review of systems is negative.  PAST MEDICAL HISTORY: Past Medical History:  Diagnosis Date  . Anemia   . Asthma   . CAP (community acquired pneumonia) 03/21/2015  . COPD (chronic obstructive pulmonary disease) ([place])   . Diabetes mellitus without complication ([place])   . Displacement of lumbar intervertebral disc without myelopathy   . Emphysema of lung ([place])   . History of chicken pox   . Shortness of breath     PAST SURGICAL HISTORY: Past Surgical  History:  Procedure Laterality Date  . CESAREAN SECTION    . TUBAL LIGATION      FAMILY HISTORY: Family History  Problem Relation Age of Onset  . Heart failure Mother   . Heart disease Mother   . Stroke Mother   . Heart disease Brother   . COPD Brother   . Cancer Maternal Aunt     Breast Cancer    ADVANCED DIRECTIVES (Y/N):  N  HEALTH MAINTENANCE: Social History  Substance Use Topics  . Smoking status: Current Every Day Smoker    Packs/day: 2.00    Years: 45.00    Types: Cigarettes  . Smokeless tobacco: Never Used     Comment: previously smoked 2 1/2 ppd for 30 years.   . Alcohol use No     Colonoscopy:  PAP:  Bone density:  Lipid panel:  Allergies  Allergen Reactions  . Codeine Anaphylaxis  . Crestor [Rosuvastatin] Anaphylaxis  . Penicillins Anaphylaxis, Rash and Other (See Comments)    Reaction:  Tongue swelling  Has patient had a PCN reaction causing immediate rash, facial/tongue/throat swelling, SOB or lightheadedness with hypotension:  Yes   Has patient had a PCN reaction causing severe rash involving mucus membranes or skin necrosis: No Has patient had a PCN reaction that required hospitalization No Has patient had a PCN reaction occurring within the last 10 years: No If all of the above answers are "NO", then may proceed with Cephalosporin use.  . [place] Venom [Bee Venom] Anaphylaxis  . Trazodone And Nefazodone   . Gemfibrozil Rash  . Lipitor [Atorvastatin] Rash    Current Outpatient Prescriptions  Medication Sig Dispense Refill  . albuterol (PROVENTIL) (2.5 MG/3ML) 0.083% nebulizer solution Take 3 mLs (2.5 mg total) by nebulization every 6 (six) hours as needed for wheezing or shortness of breath. 150 mL 1  . aspirin EC 81 MG tablet Take 81 mg by mouth at bedtime.    [name] buPROPion (WELLBUTRIN SR) 150 MG 12 hr tablet 1 tablet daily for 3 days, then 1 tablet twice daily. Stop smoking 14 days after starting medication 60 tablet 3  .  Fluticasone-Salmeterol (ADVAIR DISKUS) 250-50 MCG/DOSE AEPB Inhale 1 puff into the lungs 2 (two) times daily. 60 each 3  . glucose blood test strip Check blood sugar daily. Dx Type 2 diabetes E11.9 100 each 3  . ketoconazole (NIZORAL) 2 % cream Apply 1 application topically daily. 60 g 0  . levalbuterol (XOPENEX HFA) 45 MCG/ACT inhaler Inhale 1-2 puffs into the lungs every 4 (four) hours as needed for wheezing or shortness of breath. 1 Inhaler 12  . meclizine (ANTIVERT) 25 MG tablet TAKE ONE TABLET BY MOUTH THREE TIMES DAILY AS NEEDED FOR DIZZINESS 30 tablet 5  . meloxicam (MOBIC) 15 MG tablet Take 1 tablet (15 mg total) by mouth daily as needed for pain. 90 tablet 2  . metFORMIN (GLUCOPHAGE XR) 500 MG 24 hr tablet Take 2 tablets (1,000 mg total) by mouth 2 (two) times daily. With meals 360 tablet 4  . oxyCODONE-acetaminophen (PERCOCET) 10-325 MG tablet 1 tablet every 4-6 hours as needed 180 tablet 0  . zolpidem (AMBIEN) 10 MG tablet TAKE ONE TABLET BY MOUTH AT BEDTIME 30 tablet 4  . gemfibrozil (LOPID) 600 MG tablet Take 1 tablet (600 mg total) by mouth 2 (two) times daily before a meal. (Patient not taking: Reported on 03/01/2016) 60 tablet 1   No current facility-administered medications for this visit.     OBJECTIVE: Vitals:   03/01/16 1453  BP: 130/76  Pulse: 96  Temp: 99.1 F (37.3 C)     Body mass index is 35.38 kg/m.    ECOG FS:0 - Asymptomatic  General: Well-developed, well-nourished, no acute distress. Eyes: Pink conjunctiva, anicteric sclera. HEENT: Normocephalic, moist mucous membranes, clear oropharnyx. Lungs: Clear to auscultation bilaterally. Heart: Regular rate and rhythm. No rubs, murmurs, or gallops. Abdomen: Soft, nontender, nondistended. No organomegaly noted, normoactive bowel sounds. Musculoskeletal: No edema, cyanosis, or clubbing. Neuro: Alert, answering all questions appropriately. Cranial nerves grossly intact. Skin: No rashes or petechiae noted. Psych: Normal  affect. Lymphatics: No cervical, calvicular, axillary or inguinal LAD.   LAB RESULTS:  Lab Results  Component Value Date   NA 135 03/29/2015   K 3.8 03/29/2015   CL 97 (L) 03/29/2015   CO2 30 03/29/2015   GLUCOSE 155 (H) 03/29/2015   BUN 12 03/29/2015   CREATININE 0.50 12/28/2015   CALCIUM 8.8 (L) 03/29/2015   PROT 7.9 03/21/2015   ALBUMIN 4.4 03/21/2015   AST 28 03/21/2015   ALT 17 03/21/2015   ALKPHOS 80 03/21/2015   BILITOT 1.2 03/21/2015   GFRNONAA >60 03/29/2015   GFRAA >60 03/29/2015    Lab Results  Component Value Date   WBC 9.6 03/31/2015   NEUTROABS 8.4 (H) 03/21/2015   HGB 12.1 03/31/2015   HCT 35.8 03/31/2015   MCV 110.6 (H) 03/31/2015   PLT 258 03/31/2015     STUDIES: Ct Chest Lung Cancer Screening Low Dose Wo Contrast  Result Date: 02/28/2016 CLINICAL DATA:  61 year old female current smoker with 90 pack-year history of smoking. Lung cancer screening examination.  EXAM: CT CHEST WITHOUT CONTRAST LOW-DOSE FOR LUNG CANCER SCREENING TECHNIQUE: Multidetector CT imaging of the chest was performed following the standard protocol without IV contrast. COMPARISON:  No priors. FINDINGS: Cardiovascular: Heart size is normal. Small amount of pericardial fluid and/or thickening, unlikely to be of any hemodynamic significance at this time. No associated pericardial calcification. There is aortic atherosclerosis, as well as atherosclerosis of the great vessels of the mediastinum and the coronary arteries, including calcified atherosclerotic plaque in the left anterior descending and left circumflex coronary arteries. Mediastinum/Nodes: Enlarged subcarinal lymph node measuring 15 mm in short axis. Enlarged high right paratracheal lymph node measuring 12 mm in short axis. Bulky soft tissue in the right hilar region, presumably indicative of underlying lymphadenopathy, poorly evaluated on today's noncontrast CT examination. Esophagus is unremarkable in appearance. No axillary  lymphadenopathy. Lungs/Pleura: Multiple pulmonary nodules are noted throughout the lungs bilaterally, the largest of which is in the central aspect of the right upper lobe immediately above the minor fissure (image 119 of series 3) measuring 21.7 mm in volume derived mean diameter. This exerts mass effect upon adjacent bronchi and causes truncation of one of the segmental size bronchi leading to the lateral aspect of the inferior right upper lobe where there is an additional large somewhat nodular appearing area of probable airspace consolidation which has a volume derived mean diameter of 14.2 mm (image 126 of series 3). No pleural effusions. Upper Abdomen: Unremarkable. Musculoskeletal: There are no aggressive appearing lytic or blastic lesions noted in the visualized portions of the skeleton. IMPRESSION: 1. Lung-RADS Category 4XS, highly suspicious. Specifically, there appears to be a large right upper lobe nodule in the perihilar region with some postobstructive changes in the right upper lobe, and associated right hilar and mediastinal lymphadenopathy, as above. Additional imaging evaluation or consultation with pulmonary medicine or thoracic surgery recommended. 2. The "S" modifier above refers to potentially clinically significant non lung cancer related findings. Specifically, there is aortic atherosclerosis, in addition to 2 vessel coronary artery disease. Please note that although the presence of coronary artery calcium documents the presence of coronary artery disease, the severity of this disease and any potential stenosis cannot be assessed on this non-gated CT examination. Assessment for potential risk factor modification, dietary therapy or pharmacologic therapy may be warranted, if clinically indicated. These results were called by telephone at the time of interpretation on 02/28/2016 at 9:18 am to [facility] for Dr. Delight Hoh, who verbally acknowledged these results. Electronically Signed    By: Vinnie Langton M.D.   On: 02/28/2016 09:18    ASSESSMENT: Mass of the upper lobe of the right lung  PLAN:    1. Mass of the upper lobe of the right lung: Screening CT scan reviewed independently and reported as above, highly suspicious for underlying malignancy. Patient has a referral to pulmonology for consideration of bronchoscopy and biopsy. Have also ordered a PET scan for additional staging purposes. Patient will return to clinic one week after her biopsy to discuss the results and treatment planning.  Approximately 45 minutes was spent in discussion of which greater than 50% was consultation.  Patient expressed understanding and was in agreement with this plan. She also understands that She can call clinic at any time with any questions, concerns, or complaints.   Cancer Staging No matching staging information was found for the patient.  Lloyd Huger, MD   03/01/2016 3:43 PM
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
## 2016-03-07 ENCOUNTER — Encounter: Payer: Self-pay | Admitting: Internal Medicine

## 2016-03-07 ENCOUNTER — Ambulatory Visit (INDEPENDENT_AMBULATORY_CARE_PROVIDER_SITE_OTHER): Payer: Medicare Other | Admitting: Internal Medicine

## 2016-03-07 VITALS — BP 144/88 | HR 107 | Wt 173.0 lb

## 2016-03-07 DIAGNOSIS — F17210 Nicotine dependence, cigarettes, uncomplicated: Secondary | ICD-10-CM | POA: Diagnosis not present

## 2016-03-07 DIAGNOSIS — J441 Chronic obstructive pulmonary disease with (acute) exacerbation: Secondary | ICD-10-CM

## 2016-03-07 DIAGNOSIS — R590 Localized enlarged lymph nodes: Secondary | ICD-10-CM

## 2016-03-07 DIAGNOSIS — Z720 Tobacco use: Secondary | ICD-10-CM

## 2016-03-07 MED ORDER — FLUTICASONE-SALMETEROL 230-21 MCG/ACT IN AERO: 2.0000 | INHALATION_SPRAY | Freq: Two times a day (BID) | RESPIRATORY_TRACT | 6 refills | Status: DC

## 2016-03-07 MED ORDER — TIOTROPIUM BROMIDE MONOHYDRATE 18 MCG IN CAPS: 18.0000 ug | ORAL_CAPSULE | Freq: Every day | RESPIRATORY_TRACT | 6 refills | Status: DC

## 2016-03-07 NOTE — Patient Instructions (Addendum)
--  Start spiriva handihaler, one puff once daily.  --Start generic fluticasone/salmeterol HFA, 2 puffs twice daily.  --Call us if either is not covered.  --Continue nebulizer as needed.  --Stop all other inhalers.  --Full PFT before bronchoscopy.   --You must stop smoking before bronchoscopy. Will schedule bronchoscopy once you have stopped smoking and breathing is doing better.   --Back in 4 weeks, if doing better at that time will schedule bronchoscopy.   --Quitting smoking is the most important thing that you can do for your health.  --Quitting smoking will have greater affect on your health than any medicine that we can give you.

## 2016-03-07 NOTE — Addendum Note (Signed)
Addended by: Oscar La R on: 03/07/2016 02:21 PM   Modules accepted: Orders

## 2016-03-08 ENCOUNTER — Telehealth: Payer: Self-pay | Admitting: Family Medicine

## 2016-03-08 ENCOUNTER — Ambulatory Visit
Admission: RE | Admit: 2016-03-08 | Discharge: 2016-03-08 | Disposition: A | Discharge status: Home / Self Care | Payer: Medicare Other | Source: Ambulatory Visit | Attending: Oncology | Admitting: Oncology

## 2016-03-08 DIAGNOSIS — I313 Pericardial effusion (noninflammatory): Secondary | ICD-10-CM | POA: Insufficient documentation

## 2016-03-08 DIAGNOSIS — R918 Other nonspecific abnormal finding of lung field: Secondary | ICD-10-CM | POA: Insufficient documentation

## 2016-03-08 LAB — GLUCOSE, CAPILLARY: GLUCOSE-CAPILLARY: 148 mg/dL — AB (ref 65–99)

## 2016-03-08 MED ORDER — OXYCODONE-ACETAMINOPHEN 10-325 MG PO TABS
ORAL_TABLET | ORAL | 0 refills | Status: DC
Start: 2016-03-08 — End: 2016-03-28

## 2016-03-08 MED ORDER — FLUDEOXYGLUCOSE F - 18 (FDG) INJECTION
11.6600 | Freq: Once | INTRAVENOUS | Status: AC | PRN
  Administered 2016-03-08: 11.66 via INTRAVENOUS

## 2016-03-08 MED ORDER — ZOLPIDEM TARTRATE 10 MG PO TABS: 10.0000 mg | ORAL_TABLET | Freq: Every day | ORAL | 4 refills | Status: DC

## 2016-03-08 NOTE — Telephone Encounter (Signed)
Please advise 

## 2016-03-08 NOTE — Telephone Encounter (Signed)
Rx called into pharmacy. Patient was notified.
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
## 2016-03-08 NOTE — Telephone Encounter (Signed)
Ambien called in and pt advised percocet is ready for pick up. Renaldo Fiddler, CMA
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
## 2016-03-08 NOTE — Telephone Encounter (Signed)
Please call in zolpidem. Have printed rx for pain medication

## 2016-03-09 ENCOUNTER — Other Ambulatory Visit: Payer: Self-pay | Admitting: Family Medicine

## 2016-03-12 NOTE — Telephone Encounter (Signed)
Rx called in to pharmacy. 

## 2016-03-12 NOTE — Telephone Encounter (Signed)
Please call in zolpidem  

## 2016-03-17 ENCOUNTER — Inpatient Hospital Stay
Admission: EM | Admit: 2016-03-17 | Discharge: 2016-03-20 | DRG: 871 | Disposition: A | Discharge status: Home / Self Care | Payer: Medicare Other | Attending: Internal Medicine | Admitting: Internal Medicine

## 2016-03-17 ENCOUNTER — Encounter: Payer: Self-pay | Admitting: Emergency Medicine

## 2016-03-17 ENCOUNTER — Emergency Department: Payer: Medicare Other

## 2016-03-17 DIAGNOSIS — A419 Sepsis, unspecified organism: Secondary | ICD-10-CM

## 2016-03-17 DIAGNOSIS — J189 Pneumonia, unspecified organism: Secondary | ICD-10-CM | POA: Diagnosis not present

## 2016-03-17 DIAGNOSIS — Z7984 Long term (current) use of oral hypoglycemic drugs: Secondary | ICD-10-CM

## 2016-03-17 DIAGNOSIS — D539 Nutritional anemia, unspecified: Secondary | ICD-10-CM | POA: Diagnosis present

## 2016-03-17 DIAGNOSIS — Z823 Family history of stroke: Secondary | ICD-10-CM | POA: Diagnosis not present

## 2016-03-17 DIAGNOSIS — J441 Chronic obstructive pulmonary disease with (acute) exacerbation: Secondary | ICD-10-CM | POA: Diagnosis not present

## 2016-03-17 DIAGNOSIS — G8929 Other chronic pain: Secondary | ICD-10-CM | POA: Diagnosis present

## 2016-03-17 DIAGNOSIS — E119 Type 2 diabetes mellitus without complications: Secondary | ICD-10-CM | POA: Diagnosis present

## 2016-03-17 DIAGNOSIS — C3411 Malignant neoplasm of upper lobe, right bronchus or lung: Secondary | ICD-10-CM | POA: Diagnosis present

## 2016-03-17 DIAGNOSIS — F419 Anxiety disorder, unspecified: Secondary | ICD-10-CM | POA: Diagnosis not present

## 2016-03-17 DIAGNOSIS — Z8249 Family history of ischemic heart disease and other diseases of the circulatory system: Secondary | ICD-10-CM | POA: Diagnosis not present

## 2016-03-17 DIAGNOSIS — Z7951 Long term (current) use of inhaled steroids: Secondary | ICD-10-CM | POA: Diagnosis not present

## 2016-03-17 DIAGNOSIS — J440 Chronic obstructive pulmonary disease with acute lower respiratory infection: Secondary | ICD-10-CM | POA: Diagnosis present

## 2016-03-17 DIAGNOSIS — M549 Dorsalgia, unspecified: Secondary | ICD-10-CM | POA: Diagnosis present

## 2016-03-17 DIAGNOSIS — Z7982 Long term (current) use of aspirin: Secondary | ICD-10-CM | POA: Diagnosis not present

## 2016-03-17 DIAGNOSIS — F17210 Nicotine dependence, cigarettes, uncomplicated: Secondary | ICD-10-CM | POA: Diagnosis present

## 2016-03-17 DIAGNOSIS — R0602 Shortness of breath: Secondary | ICD-10-CM | POA: Diagnosis not present

## 2016-03-17 HISTORY — DX: Sepsis, unspecified organism: A41.9

## 2016-03-17 LAB — CBC WITH DIFFERENTIAL/PLATELET
BASOS ABS: 0 10*3/uL (ref 0–0.1)
BASOS PCT: 0 %
Eosinophils Absolute: 0 10*3/uL (ref 0–0.7)
Eosinophils Relative: 0 %
HEMATOCRIT: 24.4 % — AB (ref 35.0–47.0)
HEMOGLOBIN: 8.3 g/dL — AB (ref 12.0–16.0)
Lymphocytes Relative: 5 %
Lymphs Abs: 0.4 10*3/uL — ABNORMAL LOW (ref 1.0–3.6)
MCH: 41 pg — ABNORMAL HIGH (ref 26.0–34.0)
MCHC: 34 g/dL (ref 32.0–36.0)
MCV: 120.5 fL — ABNORMAL HIGH (ref 80.0–100.0)
Monocytes Absolute: 0.1 10*3/uL — ABNORMAL LOW (ref 0.2–0.9)
Monocytes Relative: 1 %
NEUTROS ABS: 7.5 10*3/uL — AB (ref 1.4–6.5)
NEUTROS PCT: 94 %
Platelets: 241 10*3/uL (ref 150–440)
RBC: 2.02 MIL/uL — ABNORMAL LOW (ref 3.80–5.20)
RDW: 20.6 % — ABNORMAL HIGH (ref 11.5–14.5)
WBC: 8 10*3/uL (ref 3.6–11.0)

## 2016-03-17 LAB — COMPREHENSIVE METABOLIC PANEL
ALBUMIN: 4.1 g/dL (ref 3.5–5.0)
ALK PHOS: 69 U/L (ref 38–126)
ALT: 11 U/L — ABNORMAL LOW (ref 14–54)
AST: 21 U/L (ref 15–41)
Anion gap: 10 (ref 5–15)
BILIRUBIN TOTAL: 1.5 mg/dL — AB (ref 0.3–1.2)
BUN: 8 mg/dL (ref 6–20)
CO2: 23 mmol/L (ref 22–32)
Calcium: 8.4 mg/dL — ABNORMAL LOW (ref 8.9–10.3)
Chloride: 101 mmol/L (ref 101–111)
Creatinine, Ser: 0.52 mg/dL (ref 0.44–1.00)
GFR calc Af Amer: >60 mL/min (ref >60)
GFR calc non Af Amer: >60 mL/min (ref >60)
GLUCOSE: 149 mg/dL — AB (ref 65–99)
POTASSIUM: 4 mmol/L (ref 3.5–5.1)
SODIUM: 134 mmol/L — AB (ref 135–145)
TOTAL PROTEIN: 7.2 g/dL (ref 6.5–8.1)

## 2016-03-17 LAB — TROPONIN I: Troponin I: <0.03 ng/mL (ref <0.03)

## 2016-03-17 LAB — LACTIC ACID, PLASMA
LACTIC ACID, VENOUS: 2.7 mmol/L — AB (ref 0.5–1.9)
LACTIC ACID, VENOUS: 1.6 mmol/L (ref 0.5–1.9)

## 2016-03-17 LAB — PROCALCITONIN: PROCALCITONIN: 0.66 ng/mL

## 2016-03-17 MED ORDER — DOCUSATE SODIUM 100 MG PO CAPS
100.0000 mg | ORAL_CAPSULE | Freq: Two times a day (BID) | ORAL | Status: DC
  Administered 2016-03-17 – 2016-03-20 (×6): 100 mg via ORAL
  Filled 2016-03-17 (×6): qty 1

## 2016-03-17 MED ORDER — ACETAMINOPHEN 650 MG RE SUPP: 650.0000 mg | Freq: Four times a day (QID) | RECTAL | Status: DC | PRN

## 2016-03-17 MED ORDER — ALBUTEROL SULFATE (2.5 MG/3ML) 0.083% IN NEBU
INHALATION_SOLUTION | RESPIRATORY_TRACT | Status: AC
  Administered 2016-03-17: 5 mg via RESPIRATORY_TRACT
  Filled 2016-03-17: qty 6

## 2016-03-17 MED ORDER — IPRATROPIUM-ALBUTEROL 0.5-2.5 (3) MG/3ML IN SOLN
3.0000 mL | Freq: Four times a day (QID) | RESPIRATORY_TRACT | Status: DC
  Administered 2016-03-17 – 2016-03-20 (×12): 3 mL via RESPIRATORY_TRACT
  Filled 2016-03-17 (×12): qty 3

## 2016-03-17 MED ORDER — ONDANSETRON HCL 4 MG PO TABS: 4.0000 mg | ORAL_TABLET | Freq: Four times a day (QID) | ORAL | Status: DC | PRN

## 2016-03-17 MED ORDER — OXYCODONE HCL 5 MG PO TABS
ORAL_TABLET | ORAL | Status: AC
  Administered 2016-03-17: 5 mg via ORAL
  Filled 2016-03-17: qty 1

## 2016-03-17 MED ORDER — METHYLPREDNISOLONE SODIUM SUCC 125 MG IJ SOLR
125.0000 mg | Freq: Once | INTRAMUSCULAR | Status: AC
  Administered 2016-03-17: 125 mg via INTRAVENOUS
  Filled 2016-03-17: qty 2

## 2016-03-17 MED ORDER — ALBUTEROL SULFATE (2.5 MG/3ML) 0.083% IN NEBU
5.0000 mg | INHALATION_SOLUTION | Freq: Once | RESPIRATORY_TRACT | Status: AC
  Administered 2016-03-17: 5 mg via RESPIRATORY_TRACT

## 2016-03-17 MED ORDER — OXYCODONE-ACETAMINOPHEN 10-325 MG PO TABS: 1.0000 | ORAL_TABLET | Freq: Four times a day (QID) | ORAL | Status: DC | PRN

## 2016-03-17 MED ORDER — ACETAMINOPHEN 325 MG PO TABS
650.0000 mg | ORAL_TABLET | Freq: Four times a day (QID) | ORAL | Status: DC | PRN
  Administered 2016-03-19 – 2016-03-20 (×3): 650 mg via ORAL
  Filled 2016-03-17 (×4): qty 2

## 2016-03-17 MED ORDER — ZOLPIDEM TARTRATE 5 MG PO TABS
10.0000 mg | ORAL_TABLET | Freq: Every evening | ORAL | Status: DC | PRN
  Administered 2016-03-18 – 2016-03-19 (×3): 10 mg via ORAL
  Filled 2016-03-17 (×3): qty 2

## 2016-03-17 MED ORDER — MOMETASONE FURO-FORMOTEROL FUM 200-5 MCG/ACT IN AERO
2.0000 | INHALATION_SPRAY | Freq: Two times a day (BID) | RESPIRATORY_TRACT | Status: DC
  Administered 2016-03-17 – 2016-03-20 (×6): 2 via RESPIRATORY_TRACT
  Filled 2016-03-17: qty 8.8

## 2016-03-17 MED ORDER — SODIUM CHLORIDE 0.9 % IV SOLN
INTRAVENOUS | Status: DC
  Administered 2016-03-17 – 2016-03-18 (×2): via INTRAVENOUS

## 2016-03-17 MED ORDER — ACETAMINOPHEN 325 MG PO TABS: 325.0000 mg | ORAL_TABLET | Freq: Four times a day (QID) | ORAL | Status: DC | PRN

## 2016-03-17 MED ORDER — LEVOFLOXACIN IN D5W 750 MG/150ML IV SOLN
750.0000 mg | INTRAVENOUS | Status: DC
  Administered 2016-03-18 – 2016-03-19 (×2): 750 mg via INTRAVENOUS
  Filled 2016-03-17 (×3): qty 150

## 2016-03-17 MED ORDER — ASPIRIN EC 81 MG PO TBEC
81.0000 mg | DELAYED_RELEASE_TABLET | Freq: Every day | ORAL | Status: DC
  Administered 2016-03-17 – 2016-03-19 (×3): 81 mg via ORAL
  Filled 2016-03-17 (×3): qty 1

## 2016-03-17 MED ORDER — BUPROPION HCL ER (SR) 150 MG PO TB12
150.0000 mg | ORAL_TABLET | Freq: Every day | ORAL | Status: DC
Start: 2016-03-17 — End: 2016-03-20
  Administered 2016-03-17 – 2016-03-20 (×4): 150 mg via ORAL
  Filled 2016-03-17 (×4): qty 1

## 2016-03-17 MED ORDER — METHYLPREDNISOLONE SODIUM SUCC 125 MG IJ SOLR
60.0000 mg | Freq: Two times a day (BID) | INTRAMUSCULAR | Status: DC
  Administered 2016-03-18 – 2016-03-19 (×4): 60 mg via INTRAVENOUS
  Filled 2016-03-17 (×4): qty 2

## 2016-03-17 MED ORDER — LEVOFLOXACIN IN D5W 750 MG/150ML IV SOLN
750.0000 mg | Freq: Once | INTRAVENOUS | Status: AC
  Administered 2016-03-17: 750 mg via INTRAVENOUS
  Filled 2016-03-17: qty 150

## 2016-03-17 MED ORDER — OXYCODONE HCL 5 MG PO TABS
5.0000 mg | ORAL_TABLET | Freq: Once | ORAL | Status: AC
  Administered 2016-03-17: 5 mg via ORAL

## 2016-03-17 MED ORDER — ENOXAPARIN SODIUM 40 MG/0.4ML ~~LOC~~ SOLN
40.0000 mg | SUBCUTANEOUS | Status: DC
  Administered 2016-03-17 – 2016-03-19 (×3): 40 mg via SUBCUTANEOUS
  Filled 2016-03-17 (×3): qty 0.4

## 2016-03-17 MED ORDER — GEMFIBROZIL 600 MG PO TABS
600.0000 mg | ORAL_TABLET | Freq: Two times a day (BID) | ORAL | Status: DC
  Administered 2016-03-18 – 2016-03-20 (×5): 600 mg via ORAL
  Filled 2016-03-17 (×5): qty 1

## 2016-03-17 MED ORDER — OXYCODONE HCL 5 MG PO TABS
10.0000 mg | ORAL_TABLET | Freq: Four times a day (QID) | ORAL | Status: DC | PRN
  Administered 2016-03-18 – 2016-03-20 (×8): 10 mg via ORAL
  Filled 2016-03-17 (×8): qty 2

## 2016-03-17 MED ORDER — SODIUM CHLORIDE 0.9 % IV BOLUS (SEPSIS)
1000.0000 mL | Freq: Once | INTRAVENOUS | Status: AC
  Administered 2016-03-17: 1000 mL via INTRAVENOUS

## 2016-03-17 MED ORDER — ONDANSETRON HCL 4 MG/2ML IJ SOLN: 4.0000 mg | Freq: Four times a day (QID) | INTRAMUSCULAR | Status: DC | PRN

## 2016-03-17 MED ORDER — OXYCODONE-ACETAMINOPHEN 5-325 MG PO TABS
ORAL_TABLET | ORAL | Status: AC
  Administered 2016-03-17: 1 via ORAL
  Filled 2016-03-17: qty 1

## 2016-03-17 MED ORDER — TIOTROPIUM BROMIDE MONOHYDRATE 18 MCG IN CAPS
18.0000 ug | ORAL_CAPSULE | Freq: Every day | RESPIRATORY_TRACT | Status: DC
  Administered 2016-03-17 – 2016-03-20 (×4): 18 ug via RESPIRATORY_TRACT
  Filled 2016-03-17: qty 5

## 2016-03-17 MED ORDER — OXYCODONE-ACETAMINOPHEN 5-325 MG PO TABS
1.0000 | ORAL_TABLET | Freq: Once | ORAL | Status: AC
  Administered 2016-03-17: 1 via ORAL

## 2016-03-17 NOTE — ED Notes (Signed)
Sandwich tray given at this time 

## 2016-03-17 NOTE — ED Notes (Addendum)
Pt calling out for food at this time, informed pt to wait for admitting MD.

## 2016-03-17 NOTE — H&P (Signed)
[facility] [place] NAME: Yvette Riley    MR#:  [id number]  DATE OF BIRTH:  08-14-1955  DATE OF ADMISSION:  03/17/2016  PRIMARY CARE PHYSICIAN: Lelon Huh, MD   REQUESTING/REFERRING PHYSICIAN: Dr. Lisa Roca  Chief complaint ;shortness of breath    Chief Complaint  Patient presents with  . Shortness of Breath    HISTORY OF PRESENT ILLNESS:  Yvette Riley  is a 61 y.o. female with a known history of COPD, recently diagnosed lung cancer, chronic back pain comes because of shortness of breath, low-grade temperature at home shortness of breath is worse than usual.  PAST MEDICAL HISTORY:   Past Medical History:  Diagnosis Date  . Anemia   . Asthma   . CAP (community acquired pneumonia) 03/21/2015  . COPD (chronic obstructive pulmonary disease) ([place])   . Diabetes mellitus without complication ([place])   . Displacement of lumbar intervertebral disc without myelopathy   . Emphysema of lung ([place])   . History of chicken pox   . Shortness of breath     PAST SURGICAL HISTOIRY:   Past Surgical History:  Procedure Laterality Date  . CESAREAN SECTION    . TUBAL LIGATION      SOCIAL HISTORY:   Social History  Substance Use Topics  . Smoking status: Current Every Day Smoker    Packs/day: 0.25    Years: 45.00    Types: Cigarettes  . Smokeless tobacco: Never Used     Comment: previously smoked 2 1/2 ppd for 30 years.   . Alcohol use No    FAMILY HISTORY:   Family History  Problem Relation Age of Onset  . Heart failure Mother   . Heart disease Mother   . Stroke Mother   . Heart disease Brother   . COPD Brother   . Cancer Maternal Aunt     Breast Cancer    DRUG ALLERGIES:   Allergies  Allergen Reactions  . Codeine Anaphylaxis  . Crestor [Rosuvastatin] Anaphylaxis  . Penicillins Anaphylaxis, Rash and Other (See Comments)    Reaction:  Tongue swelling  Has patient had a PCN reaction causing immediate rash,  facial/tongue/throat swelling, SOB or lightheadedness with hypotension:  Yes   Has patient had a PCN reaction causing severe rash involving mucus membranes or skin necrosis: No Has patient had a PCN reaction that required hospitalization No Has patient had a PCN reaction occurring within the last 10 years: No If all of the above answers are "NO", then may proceed with Cephalosporin use.  . Yellow Jacket Venom [Bee Venom] Anaphylaxis  . Trazodone And Nefazodone   . Gemfibrozil Rash  . Lipitor [Atorvastatin] Rash    REVIEW OF SYSTEMS:  CONSTITUTIONAL: No fever, fatigue or weakness.  EYES: No blurred or double vision.  EARS, NOSE, AND THROAT: No tinnitus or ear pain.  RESPIRATORY: cough, shortness of breath, wheezing.[name]  CARDIOVASCULAR: No chest pain, orthopnea, edema.  GASTROINTESTINAL: No nausea, vomiting, diarrhea or abdominal pain.  GENITOURINARY: No dysuria, hematuria.  ENDOCRINE: No polyuria, nocturia,  HEMATOLOGY: No anemia, easy bruising or bleeding SKIN: No rash or lesion. MUSCULOSKELETAL: No joint pain or arthritis.   NEUROLOGIC: No tingling, numbness, weakness.  PSYCHIATRY: No anxiety or depression.   MEDICATIONS AT HOME:   Prior to Admission medications   Medication Sig Start Date End Date Taking? Authorizing Provider  albuterol (PROVENTIL) (2.5 MG/3ML) 0.083% nebulizer solution Take 3 mLs (2.5 mg total) by nebulization every 6 (six) hours  as needed for wheezing or shortness of breath. 04/06/15  Yes Birdie Sons, MD  aspirin EC 81 MG tablet Take 81 mg by mouth at bedtime.   Yes Historical Provider, MD  buPROPion (WELLBUTRIN SR) 150 MG 12 hr tablet 1 tablet daily for 3 days, then 1 tablet twice daily. Stop smoking 14 days after starting medication Patient taking differently: Take 150 mg by mouth daily. 1 tablet daily for 3 days, then 1 tablet twice daily. Stop smoking 14 days after starting medication 11/13/15 03/17/16 Yes Birdie Sons, MD  levalbuterol [facility] HFA) 45  MCG/ACT inhaler Inhale 1-2 puffs into the lungs every 4 (four) hours as needed for wheezing or shortness of breath. 04/06/15  Yes Birdie Sons, MD  meclizine (ANTIVERT) 25 MG tablet TAKE ONE TABLET BY MOUTH THREE TIMES DAILY AS NEEDED FOR DIZZINESS 02/16/16  Yes Birdie Sons, MD  meloxicam (MOBIC) 15 MG tablet Take 1 tablet (15 mg total) by mouth daily as needed for pain. 11/13/15  Yes Birdie Sons, MD  metFORMIN (GLUCOPHAGE XR) 500 MG 24 hr tablet Take 2 tablets (1,000 mg total) by mouth 2 (two) times daily. With meals 02/19/16  Yes Birdie Sons, MD  oxyCODONE-acetaminophen [facility]) 10-325 MG tablet 1 tablet every 4-6 hours as needed 03/08/16  Yes Birdie Sons, MD  zolpidem (AMBIEN) 10 MG tablet TAKE ONE TABLET BY MOUTH ONCE DAILY AT BEDTIME 03/12/16  Yes Birdie Sons, MD  fluticasone-salmeterol (ADVAIR HFA) 230-21 MCG/ACT inhaler Inhale 2 puffs into the lungs 2 (two) times daily. Patient not taking: Reported on 03/17/2016 03/07/16   Laverle Hobby, MD  gemfibrozil (LOPID) 600 MG tablet Take 1 tablet (600 mg total) by mouth 2 (two) times daily before a meal. Patient not taking: Reported on 03/17/2016 11/13/15   Birdie Sons, MD  glucose blood test strip Check blood sugar daily. Dx Type 2 diabetes E11.9 06/07/15   Birdie Sons, MD  ketoconazole (NIZORAL) 2 % cream Apply 1 application topically daily. Patient not taking: Reported on 03/17/2016 02/20/16   Birdie Sons, MD  tiotropium (SPIRIVA) 18 MCG inhalation capsule Place 1 capsule (18 mcg total) into inhaler and inhale daily. Patient not taking: Reported on 03/17/2016 03/07/16   Laverle Hobby, MD      VITAL SIGNS:  Blood pressure 128/75, pulse 99, temperature 99.3 F (37.4 C), temperature source Oral, resp. rate 17, height '4\' 10"'$  (1.473 m), weight 79.4 kg (175 lb), SpO2 96 %.  PHYSICAL EXAMINATION:  GENERAL:  61 y.o.-year-old patient lying in the bed with no acute distress.  EYES: Pupils equal, round, reactive to  light and accommodation. No scleral icterus. Extraocular muscles intact.  HEENT: Head atraumatic, normocephalic. Oropharynx and nasopharynx clear.  NECK:  Supple, no jugular venous distention. No thyroid enlargement, no tenderness.  LUNGS:  Diffuse  expiratory wheeze bilaterally. CARDIOVASCULAR: S1, S2 normal. No murmurs, rubs, or gallops.  ABDOMEN: Soft, nontender, nondistended. Bowel sounds present. No organomegaly or mass.  EXTREMITIES: No pedal edema, cyanosis, or clubbing.  NEUROLOGIC: Cranial nerves II through XII are intact. Muscle strength 5/5 in all extremities. Sensation intact. Gait not checked.  PSYCHIATRIC: The patient is alert and oriented x 3.  SKIN: No obvious rash, lesion, or ulcer.   LABORATORY PANEL:   CBC  Recent Labs Lab 03/17/16 1745  WBC 8.0  HGB 8.3*  HCT 24.4*  PLT 241   ------------------------------------------------------------------------------------------------------------------  Chemistries   Recent Labs Lab 03/17/16 1840  NA 134*  K 4.0  CL 101  CO2 23  GLUCOSE 149*  BUN 8  CREATININE 0.52  CALCIUM 8.4*  AST 21  ALT 11*  ALKPHOS 69  BILITOT 1.5*   ------------------------------------------------------------------------------------------------------------------  Cardiac Enzymes  Recent Labs Lab 03/17/16 1840  TROPONINI <0.03   ------------------------------------------------------------------------------------------------------------------  RADIOLOGY:  Dg Chest 2 View  Result Date: 03/17/2016 CLINICAL DATA:  Shortness of breath.  History of lung carcinoma EXAM: CHEST  2 VIEW COMPARISON:  PET-CT March 08, 2016. Chest radiograph April 06, 2015 FINDINGS: There is soft tissue prominence in the right perihilar region with adjacent consolidation consistent with right hilar neoplasm noted on recent PET study. There is probable pneumonitis in this immediate vicinity as well. Lungs elsewhere clear. Heart size and pulmonary vascularity  are normal. No adenopathy by radiography. No bone lesions. IMPRESSION: Mass with adjacent pneumonitis right perihilar region. This lesion corresponds to the neoplastic lesions seen on the recent PET study. The area of consolidation in the periphery of the right upper lobe seen on recent PET study is not evident by radiography. No new areas of increased opacity identified. Stable cardiac silhouette. Electronically Signed   By: Lowella Grip III M.D.   On: 03/17/2016 13:59    EKG:   Orders placed or performed during the hospital encounter of 03/17/16  . ED EKG  . ED EKG  . EKG 12-Lead  . EKG 12-Lead    IMPRESSION AND PLAN:   1.. COPD exacerbation;pt told me that she is wheezing all the times ,thats not new for her. Because of slight tachycardia, elevated lactic acid ,will admit the patient for possible postobstructive pneumonia: Patient will be admitted to hospitalist service, continue Levaquin, nebulizers, oxygen, small  Dose  of steroids 2. New diagnosis of lung cancer: Follow up with Dr. Grayland Ormond as an outpatient #3 chronic back pain: Continue pain medication GI, DVT prophylaxis.  D/w  with patient's son.  Epifanio Lesches, MD 03/17/2016, 8:20 PM diagnosis o.mew  All the records are reviewed and case discussed with ED provider. Management plans discussed with the patient, family and they are in agreement.  CODE STATUS: full  TOTAL TIME TAKING CARE OF THIS PATIENT:55 minminutes.    Epifanio Lesches M.D on 03/17/2016 at 8:12 PM  Between 7am to 6pm - Pager - [phone]  After 6pm go to [email or web address] - password [email or web address] [facility]  Office  [phone]  CC: Primary care physician; Lelon Huh, MD  Note: This dictation was prepared with Dragon dictation along with smaller phrase technology. Any transcriptional errors that result from this process are unintentional.

## 2016-03-17 NOTE — Progress Notes (Signed)
Pharmacy Antibiotic Note  Yvette Riley is a 61 y.o. female admitted on 03/17/2016 with pneumonia.  Pharmacy has been consulted for Levaquin dosing.  Plan: Levaquin '750mg'$  IV q24h  Height: '4\' 10"'$  (147.3 cm) Weight: 175 lb (79.4 kg) IBW/kg (Calculated) : 40.9  Temp (24hrs), Avg:99.3 F (37.4 C), Min:99.3 F (37.4 C), Max:99.3 F (37.4 C)   Recent Labs Lab 03/17/16 1555 03/17/16 1745 03/17/16 1840  WBC  --  8.0  --   CREATININE  --   --  0.52  LATICACIDVEN 2.7*  --  1.6    Estimated Creatinine Clearance: 67.3 mL/min (by C-G formula based on SCr of 0.52 mg/dL).    Allergies  Allergen Reactions  . Codeine Anaphylaxis  . Crestor [Rosuvastatin] Anaphylaxis  . Penicillins Anaphylaxis, Rash and Other (See Comments)    Reaction:  Tongue swelling  Has patient had a PCN reaction causing immediate rash, facial/tongue/throat swelling, SOB or lightheadedness with hypotension:  Yes   Has patient had a PCN reaction causing severe rash involving mucus membranes or skin necrosis: No Has patient had a PCN reaction that required hospitalization No Has patient had a PCN reaction occurring within the last 10 years: No If all of the above answers are "NO", then may proceed with Cephalosporin use.  . Yellow [place] Venom [Bee Venom] Anaphylaxis  . Trazodone And Nefazodone   . Gemfibrozil Rash  . Lipitor [Atorvastatin] Rash    Antimicrobials this admission: Levaquin 2/18 >>   Thank you for allowing pharmacy to be a part of this patient's care.  Paulina Fusi, PharmD, BCPS 03/17/2016 8:13 PM

## 2016-03-17 NOTE — Progress Notes (Signed)
Pt takes '10mg'$  ambien at home every night. It was not ordered here. MD paged. Dr. Ara Kussmaul gave orders for pt to continue her '10mg'$  ambien here. Will place orders. No furter concerns. Conley Simmonds, RN, BSN

## 2016-03-17 NOTE — ED Notes (Signed)
Pt calling out to use the bathroom, states she can't use the bedside commode because " it doesn't feel right". NT wheeled pt to bathroom by wheelchair

## 2016-03-17 NOTE — ED Notes (Signed)
Family wait

## 2016-03-17 NOTE — Progress Notes (Signed)
Chaplain received a page to visit with pt in ED-13. Provided education for an Advanced directive.    03/17/16 1912  Clinical Encounter Type  Visited With Patient  Visit Type Initial  Referral From Nurse  Consult/Referral To Chaplain  Spiritual Encounters  Spiritual Needs Other (Comment)

## 2016-03-17 NOTE — ED Provider Notes (Signed)
[facility] Emergency Department Provider Note ____________________________________________   I have reviewed the triage vital signs and the triage nursing note.  HISTORY  Chief Complaint Shortness of Breath   Historian Patient, and family friend at the bedside  HPI Yvette Riley is a 61 y.o. female with history of COPD, recently diagnosed with lung cancer, but not started treating her, states that this morning she felt more short of breath and tried her albuterol nebulizer twice with minimal relief. States that she felt hot this morning, did not take her temperature, and then has felt a little chilly here in the emergency department, she is not sure if this is chills or not.  Denies chest pain. Shortness of breath is moderate and worse with exertion.    Past Medical History:  Diagnosis Date  . Anemia   . Asthma   . CAP (community acquired pneumonia) 03/21/2015  . COPD (chronic obstructive pulmonary disease) ([place])   . Diabetes mellitus without complication ([place])   . Displacement of lumbar intervertebral disc without myelopathy   . Emphysema of lung ([place])   . History of chicken pox   . Shortness of breath     Patient Active Problem List   Diagnosis Date Noted  . Mass of upper lobe of right lung 03/01/2016  . Coronary atherosclerosis 02/29/2016  . Personal history of tobacco use, presenting hazards to health 02/28/2016  . Pneumonia 03/29/2015  . Allergic rhinitis 09/27/2014  . Asthma 09/27/2014  . Anemia 09/27/2014  . Hyperglyceridemia, pure 09/27/2014  . Glaucoma 09/27/2014  . Hip pain 09/27/2014  . Right knee pain 09/27/2014  . Displacement of lumbar intervertebral disc without myelopathy 09/27/2014  . Thoracic back pain 09/27/2014  . Insomnia 08/11/2014  . COPD (chronic obstructive pulmonary disease) ([place]) 06/12/2014  . Type 2 diabetes mellitus ([place]) 06/12/2014  . Back pain 06/12/2014  . Essential hypertension 03/28/2014  . Smoking  greater than 30 pack years 03/28/2014    Past Surgical History:  Procedure Laterality Date  . CESAREAN SECTION    . TUBAL LIGATION      Prior to Admission medications   Medication Sig Start Date End Date Taking? Authorizing Provider  albuterol (PROVENTIL) (2.5 MG/3ML) 0.083% nebulizer solution Take 3 mLs (2.5 mg total) by nebulization every 6 (six) hours as needed for wheezing or shortness of breath. 04/06/15   Birdie Sons, MD  aspirin EC 81 MG tablet Take 81 mg by mouth at bedtime.    Historical Provider, MD  buPROPion (WELLBUTRIN SR) 150 MG 12 hr tablet 1 tablet daily for 3 days, then 1 tablet twice daily. Stop smoking 14 days after starting medication 11/13/15 03/12/16  Birdie Sons, MD  fluticasone-salmeterol (ADVAIR HFA) [phone] MCG/ACT inhaler Inhale 2 puffs into the lungs 2 (two) times daily. 03/07/16   Laverle Hobby, MD  gemfibrozil (LOPID) 600 MG tablet Take 1 tablet (600 mg total) by mouth 2 (two) times daily before a meal. 11/13/15   Birdie Sons, MD  glucose blood test strip Check blood sugar daily. Dx Type 2 diabetes E11.9 06/07/15   Birdie Sons, MD  ketoconazole (NIZORAL) 2 % cream Apply 1 application topically daily. 02/20/16   Birdie Sons, MD  levalbuterol [facility] HFA) 45 MCG/ACT inhaler Inhale 1-2 puffs into the lungs every 4 (four) hours as needed for wheezing or shortness of breath. 04/06/15   Birdie Sons, MD  meclizine (ANTIVERT) 25 MG tablet TAKE ONE TABLET BY MOUTH THREE TIMES DAILY AS  NEEDED FOR DIZZINESS 02/16/16   Birdie Sons, MD  meloxicam (MOBIC) 15 MG tablet Take 1 tablet (15 mg total) by mouth daily as needed for pain. 11/13/15   Birdie Sons, MD  metFORMIN (GLUCOPHAGE XR) 500 MG 24 hr tablet Take 2 tablets (1,000 mg total) by mouth 2 (two) times daily. With meals 02/19/16   Birdie Sons, MD  oxyCODONE-acetaminophen [facility]) 10-325 MG tablet 1 tablet every 4-6 hours as needed 03/08/16   Birdie Sons, MD  tiotropium (SPIRIVA) 18 MCG  inhalation capsule Place 1 capsule (18 mcg total) into inhaler and inhale daily. 03/07/16   Laverle Hobby, MD  zolpidem (AMBIEN) 10 MG tablet TAKE ONE TABLET BY MOUTH ONCE DAILY AT BEDTIME 03/12/16   Birdie Sons, MD    Allergies  Allergen Reactions  . Codeine Anaphylaxis  . Crestor [Rosuvastatin] Anaphylaxis  . Penicillins Anaphylaxis, Rash and Other (See Comments)    Reaction:  Tongue swelling  Has patient had a PCN reaction causing immediate rash, facial/tongue/throat swelling, SOB or lightheadedness with hypotension:  Yes   Has patient had a PCN reaction causing severe rash involving mucus membranes or skin necrosis: No Has patient had a PCN reaction that required hospitalization No Has patient had a PCN reaction occurring within the last 10 years: No If all of the above answers are "NO", then may proceed with Cephalosporin use.  . Yellow Jacket Venom [Bee Venom] Anaphylaxis  . Trazodone And Nefazodone   . Gemfibrozil Rash  . Lipitor [Atorvastatin] Rash    Family History  Problem Relation Age of Onset  . Heart failure Mother   . Heart disease Mother   . Stroke Mother   . Heart disease Brother   . COPD Brother   . Cancer Maternal Aunt     Breast Cancer    Social History Social History  Substance Use Topics  . Smoking status: Current Every Day Smoker    Packs/day: 0.25    Years: 45.00    Types: Cigarettes  . Smokeless tobacco: Never Used     Comment: previously smoked 2 1/2 ppd for 30 years.   . Alcohol use No    Review of Systems  Constitutional: Subjective fever and chills Eyes: Negative for visual changes. ENT: Negative for sore throat. Cardiovascular: Negative for chest pain. Respiratory: Positive for shortness of breath.  Negative for productive cough. Gastrointestinal: Negative for abdominal pain, vomiting and diarrhea. Genitourinary: Negative for dysuria. Musculoskeletal: Negative for back pain. Skin: Negative for rash. Neurological: Negative for  headache. 10 point Review of Systems otherwise negative ____________________________________________   PHYSICAL EXAM:  VITAL SIGNS: ED Triage Vitals  Enc Vitals Group     BP 03/17/16 1254 (!) 124/56     Pulse Rate 03/17/16 1254 87     Resp 03/17/16 1254 20     Temp 03/17/16 1254 99.3 F (37.4 C)     Temp Source 03/17/16 1254 Oral     SpO2 03/17/16 1254 93 %     Weight 03/17/16 1254 175 lb (79.4 kg)     Height 03/17/16 1254 '4\' 10"'$  (1.473 m)     Head Circumference --      Peak Flow --      Pain Score 03/17/16 1539 0     Pain Loc --      Pain Edu? --      Excl. in [place]? --      Constitutional: Alert and oriented. Well appearing, no respiratory distress. HEENT  Head: Normocephalic and atraumatic.      Eyes: Conjunctivae are normal. PERRL. Normal extraocular movements.      Ears:         Nose: No congestion/rhinnorhea.   Mouth/Throat: Mucous membranes are moist.   Neck: No stridor. Cardiovascular/Chest: Tachycardic rate, regular rhythm.  No murmurs, rubs, or gallops. Respiratory: Normal respiratory effort without tachypnea nor retractions. Moderate end expiratory wheezing in all fields with rhonchi bilaterally especially posteriorly. Gastrointestinal: Soft. No distention, no guarding, no rebound. Nontender.    Genitourinary/rectal:Deferred Musculoskeletal: Nontender with normal range of motion in all extremities. No joint effusions.  No lower extremity tenderness.  No edema. Neurologic:  Normal speech and language. No gross or focal neurologic deficits are appreciated. Skin:  Skin is warm, dry and intact. No rash noted. Psychiatric: Mood and affect are normal. Speech and behavior are normal. Patient exhibits appropriate insight and judgment.   ____________________________________________  LABS (pertinent positives/negatives)  Labs Reviewed  CULTURE, BLOOD (ROUTINE X 2)  CULTURE, BLOOD (ROUTINE X 2)  LACTIC ACID, PLASMA  LACTIC ACID, PLASMA     ____________________________________________    EKG I, Lisa Roca, MD, the attending physician have personally viewed and interpreted all ECGs.  90 bpm. Normal sinus rhythm. Normal axis. Nonspecific intraventricular conduction delay. Nonspecific ST and T-wave ____________________________________________  RADIOLOGY All Xrays were viewed by me. Imaging interpreted by Radiologist.  Chest x-ray two-view:  IMPRESSION: Mass with adjacent pneumonitis right perihilar region. This lesion corresponds to the neoplastic lesions seen on the recent PET study. The area of consolidation in the periphery of the right upper lobe seen on recent PET study is not evident by radiography. No new areas of increased opacity identified. Stable cardiac silhouette. __________________________________________  PROCEDURES  Procedure(s) performed: None  Critical Care performed: None  ____________________________________________   ED COURSE / ASSESSMENT AND PLAN  Pertinent labs & imaging results that were available during my care of the patient were reviewed by me and considered in my medical decision making (see chart for details).   Mr. Liane Comber is here with worsening dyspnea and is also fairly tachycardic. Her chest x-ray shows the known underlying lung mass without new evidence for pneumonia. However she has a low-grade temperature here, and subjective fever and chills and given the tachycardia I am going to place her on the sepsis pathway and treat her for possible community acquired pneumonia with Levaquin as she reports a severe allergy to penicillin.  She is significantly wheezing, was given DuoNeb treatment for that as well as Solu-Medrol.  At rest she is not hypoxic, but states that she was barely able to walk to the bathroom earlier. I think she is any hospital treatment for COPD exacerbation, possible pulmonary infection.  I don't have a high suspicion for PE at this point in  time.   CONSULTATIONS:  Hospitalist for admission.   Patient / Family / Caregiver informed of clinical course, medical decision-making process, and agree with plan.  ___________________________________________   FINAL CLINICAL IMPRESSION(S) / ED DIAGNOSES   Final diagnoses:  COPD exacerbation ([place])              Note: This dictation was prepared with Dragon dictation. Any transcriptional errors that result from this process are unintentional    Lisa Roca, MD 03/17/16 [phone]

## 2016-03-17 NOTE — ED Notes (Addendum)
Pharmacy does not carry 10-325 percocet, per pharm give one 5-325 percocet with '5mg'$  oxycodone. MD Vianne Bulls notified. Verbal order given

## 2016-03-17 NOTE — ED Notes (Signed)
Pt was taken to the restroom by this tech. Pt is complaining of nausea due to a lack of sustenance. Rn notified, Rn brought pt a sandwich tray and drink. Pt has been complaining of back pain *chronic*. Pt st. "I really need to take my pain meds for my back Ive been here since 12 o clock and I havent gotten anything; Ive asked and asked and I haven't got anything. I have my meds with me I just don't wanna take them without you guys knowing". Pt was reassured that this tech would attempt to solve the issue or get some answers regarding the problem. RN notified.

## 2016-03-17 NOTE — ED Triage Notes (Signed)
Patient presents to the ED with shortness of breath since last night.  Patient has history of lung cancer but has not yet started treatment for it.  Patient reports using albuterol breathing treatments at home with only small amount of improvement with shortness of breath.

## 2016-03-18 LAB — CBC
HCT: 24.5 % — ABNORMAL LOW (ref 35.0–47.0)
Hemoglobin: 8.4 g/dL — ABNORMAL LOW (ref 12.0–16.0)
MCH: 41.7 pg — ABNORMAL HIGH (ref 26.0–34.0)
MCHC: 34.2 g/dL (ref 32.0–36.0)
MCV: 121.8 fL — AB (ref 80.0–100.0)
PLATELETS: 262 10*3/uL (ref 150–440)
RBC: 2.01 MIL/uL — AB (ref 3.80–5.20)
RDW: 19.8 % — AB (ref 11.5–14.5)
WBC: 6.4 10*3/uL (ref 3.6–11.0)

## 2016-03-18 LAB — GLUCOSE, CAPILLARY
GLUCOSE-CAPILLARY: 240 mg/dL — AB (ref 65–99)
Glucose-Capillary: 326 mg/dL — ABNORMAL HIGH (ref 65–99)
Glucose-Capillary: 291 mg/dL — ABNORMAL HIGH (ref 65–99)
Glucose-Capillary: 258 mg/dL — ABNORMAL HIGH (ref 65–99)

## 2016-03-18 LAB — BASIC METABOLIC PANEL
Anion gap: 10 (ref 5–15)
BUN: 11 mg/dL (ref 6–20)
CALCIUM: 8.6 mg/dL — AB (ref 8.9–10.3)
CO2: 21 mmol/L — ABNORMAL LOW (ref 22–32)
CREATININE: 0.64 mg/dL (ref 0.44–1.00)
Chloride: 104 mmol/L (ref 101–111)
Glucose, Bld: 322 mg/dL — ABNORMAL HIGH (ref 65–99)
Potassium: 4.3 mmol/L (ref 3.5–5.1)
SODIUM: 135 mmol/L (ref 135–145)

## 2016-03-18 MED ORDER — INSULIN ASPART 100 UNIT/ML ~~LOC~~ SOLN
0.0000 [IU] | Freq: Three times a day (TID) | SUBCUTANEOUS | Status: DC
  Administered 2016-03-18: 5 [IU] via SUBCUTANEOUS
  Administered 2016-03-19: 2 [IU] via SUBCUTANEOUS
  Administered 2016-03-19: 7 [IU] via SUBCUTANEOUS
  Administered 2016-03-19: 3 [IU] via SUBCUTANEOUS
  Administered 2016-03-20: 2 [IU] via SUBCUTANEOUS
  Administered 2016-03-20: 3 [IU] via SUBCUTANEOUS
  Filled 2016-03-18: qty 5
  Filled 2016-03-18 (×2): qty 2
  Filled 2016-03-18: qty 7
  Filled 2016-03-18 (×2): qty 3

## 2016-03-18 MED ORDER — ENSURE ENLIVE PO LIQD
237.0000 mL | Freq: Two times a day (BID) | ORAL | Status: DC
  Administered 2016-03-18 – 2016-03-20 (×4): 237 mL via ORAL

## 2016-03-18 MED ORDER — ALPRAZOLAM 0.25 MG PO TABS: 0.2500 mg | ORAL_TABLET | Freq: Three times a day (TID) | ORAL | Status: DC | PRN

## 2016-03-18 NOTE — Progress Notes (Signed)
Yvette Riley at [place] NAME: Yvette Riley    MR#:  [id number]  DATE OF BIRTH:  February 15, 1955  SUBJECTIVE:  CHIEF COMPLAINT:   Chief Complaint  Patient presents with  . Shortness of Breath    Came with shortness of breath, recently diagnosed with lung mass.   Under treatment for COPD currently, as per nurse , also had some anxiety episodes where she have tachycardia and she feels short of breath but her oxygen saturation does not drop.  REVIEW OF SYSTEMS:  CONSTITUTIONAL: No fever, fatigue or weakness.  EYES: No blurred or double vision.  EARS, NOSE, AND THROAT: No tinnitus or ear pain.  RESPIRATORY: No cough,Positive for shortness of breath, wheezing , no hemoptysis.  CARDIOVASCULAR: No chest pain, orthopnea, edema.  GASTROINTESTINAL: No nausea, vomiting, diarrhea or abdominal pain.  GENITOURINARY: No dysuria, hematuria.  ENDOCRINE: No polyuria, nocturia,  HEMATOLOGY: No anemia, easy bruising or bleeding SKIN: No rash or lesion. MUSCULOSKELETAL: No joint pain or arthritis.   NEUROLOGIC: No tingling, numbness, weakness.  PSYCHIATRY: No anxiety or depression.   ROS  DRUG ALLERGIES:   Allergies  Allergen Reactions  . Codeine Anaphylaxis  . Crestor [Rosuvastatin] Anaphylaxis  . Penicillins Anaphylaxis, Rash and Other (See Comments)    Reaction:  Tongue swelling  Has patient had a PCN reaction causing immediate rash, facial/tongue/throat swelling, SOB or lightheadedness with hypotension:  Yes   Has patient had a PCN reaction causing severe rash involving mucus membranes or skin necrosis: No Has patient had a PCN reaction that required hospitalization No Has patient had a PCN reaction occurring within the last 10 years: No If all of the above answers are "NO", then may proceed with Cephalosporin use.  . Yellow Jacket Venom [Bee Venom] Anaphylaxis  . Trazodone And Nefazodone   . Gemfibrozil Rash  . Lipitor [Atorvastatin] Rash     VITALS:  Blood pressure 125/67, pulse (!) 101, temperature 98.6 F (37 C), temperature source Oral, resp. rate (!) 22, height '4\' 10"'$  (1.473 m), weight 79.4 kg (175 lb), SpO2 94 %.  PHYSICAL EXAMINATION:  GENERAL:  61 y.o.-year-old patient lying in the bed with no acute distress.  EYES: Pupils equal, round, reactive to light and accommodation. No scleral icterus. Extraocular muscles intact.  HEENT: Head atraumatic, normocephalic. Oropharynx and nasopharynx clear.  NECK:  Supple, no jugular venous distention. No thyroid enlargement, no tenderness.  LUNGS: Normal breath sounds bilaterally, some wheezing, no crepitation. No use of accessory muscles of respiration.  CARDIOVASCULAR: S1, S2 normal. No murmurs, rubs, or gallops.  ABDOMEN: Soft, nontender, nondistended. Bowel sounds present. No organomegaly or mass.  EXTREMITIES: No pedal edema, cyanosis, or clubbing.  NEUROLOGIC: Cranial nerves II through XII are intact. Muscle strength 5/5 in all extremities. Sensation intact. Gait not checked.  PSYCHIATRIC: The patient is alert and oriented x 3.  SKIN: No obvious rash, lesion, or ulcer.   Physical Exam LABORATORY PANEL:   CBC  Recent Labs Lab 03/18/16 0503  WBC 6.4  HGB 8.4*  HCT 24.5*  PLT 262   ------------------------------------------------------------------------------------------------------------------  Chemistries   Recent Labs Lab 03/17/16 1840 03/18/16 0503  NA 134* 135  K 4.0 4.3  CL 101 104  CO2 23 21*  GLUCOSE 149* 322*  BUN 8 11  CREATININE 0.52 0.64  CALCIUM 8.4* 8.6*  AST 21  --   ALT 11*  --   ALKPHOS 69  --   BILITOT 1.5*  --    ------------------------------------------------------------------------------------------------------------------  Cardiac Enzymes  Recent Labs Lab 03/17/16 1840  TROPONINI <0.03   ------------------------------------------------------------------------------------------------------------------  RADIOLOGY:  Dg  Chest 2 View  Result Date: 03/17/2016 CLINICAL DATA:  Shortness of breath.  History of lung carcinoma EXAM: CHEST  2 VIEW COMPARISON:  PET-CT March 08, 2016. Chest radiograph April 06, 2015 FINDINGS: There is soft tissue prominence in the right perihilar region with adjacent consolidation consistent with right hilar neoplasm noted on recent PET study. There is probable pneumonitis in this immediate vicinity as well. Lungs elsewhere clear. Heart size and pulmonary vascularity are normal. No adenopathy by radiography. No bone lesions. IMPRESSION: Mass with adjacent pneumonitis right perihilar region. This lesion corresponds to the neoplastic lesions seen on the recent PET study. The area of consolidation in the periphery of the right upper lobe seen on recent PET study is not evident by radiography. No new areas of increased opacity identified. Stable cardiac silhouette. Electronically Signed   By: Lowella Grip III M.D.   On: 03/17/2016 13:59    ASSESSMENT AND PLAN:   Active Problems:   Sepsis ([place])  1.. COPD exacerbation; pt told me that she is wheezing all the times ,thats not new for her.  slight tachycardia, elevated lactic acid ,  possible postobstructive pneumonia:   continue Levaquin, nebulizers, oxygen, small  Dose  of steroids 2. New diagnosis of lung cancer: Follow up with Dr. Grayland Ormond as an outpatient 3 chronic back pain: Continue pain medication GI, DVT prophylaxis.    All the records are reviewed and case discussed with Care Management/Social Workerr. Management plans discussed with the patient, family and they are in agreement.  CODE STATUS: Full  TOTAL TIME TAKING CARE OF THIS PATIENT: 35 minutes.    POSSIBLE D/C IN 1-2 DAYS, DEPENDING ON CLINICAL CONDITION.   Vaughan Basta M.D on 03/18/2016   Between 7am to 6pm - Pager - [phone]  After 6pm go to [email or web address] - password [email or web address] [facility]  Office  [phone]  CC: Primary  care physician; Lelon Huh, MD  Note: This dictation was prepared with Dragon dictation along with smaller phrase technology. Any transcriptional errors that result from this process are unintentional.

## 2016-03-18 NOTE — Plan of Care (Signed)
Problem: Education: Goal: Knowledge of [place] [facility] Education information/materials will improve Outcome: Progressing Patient is improving.   Problem: Pain Managment: Goal: General experience of comfort will improve Outcome: Progressing Patient is comfortable.   Problem: Physical Regulation: Goal: Ability to maintain clinical measurements within normal limits will improve Outcome: Progressing Patient is stabilized.   Comments: Patient is pleasant. Patient walked around the nurses station without oxygen and did not qualify for the home oxygen.

## 2016-03-18 NOTE — Progress Notes (Signed)
SATURATION QUALIFICATIONS: (This note is used to comply with regulatory documentation for home oxygen)  Patient Saturations on Room Air at Rest = 96%  Patient Saturations on Room Air while Ambulating = 93%  Patient Saturations on 2 Liters of oxygen while Ambulating = 97%

## 2016-03-18 NOTE — Progress Notes (Signed)
Initial Nutrition Assessment  DOCUMENTATION CODES:   Not applicable  INTERVENTION:  1. Ensure Enlive po BID, each supplement provides 350 kcal and 20 grams of protein  NUTRITION DIAGNOSIS:   Inadequate oral intake related to poor appetite, cancer and cancer related treatments as evidenced by per patient/family report.  GOAL:   Patient will meet greater than or equal to 90% of their needs  MONITOR:   PO intake, Supplement acceptance, I & O's, Labs, Weight trends  REASON FOR ASSESSMENT:   Rounds    ASSESSMENT:   Yvette Riley  is a 61 y.o. female with a known history of COPD, recently diagnosed lung cancer, chronic back pain comes because of shortness of breath, low-grade temperature at home shortness of breath is worse than usual.  Spoke with patient at bedside. She reports poor appetite x3 months with concomitant weight loss. Per chart, exhibits a severe 17#/8.8% wt loss. She no longer cooks at home, and is afraid to go out, does not drive, because of frequent falls. Normally eats 1 meal a day, comes from her son who works at [employer]. Ate 1/2 an omelet, some oatmeal, and coffee for breakfast. PO documented 75% thus far. Suspect malnutrition but patient's physical exam is WDL. Denies any nausea/vomiting/diarrhea/constipation. Denies any issues chewing/swallowing. Labs and medications reviewed: CBGs 326, 240 Colace, Solumedrol    Diet Order:  Diet heart healthy/carb modified Room service appropriate? Yes; Fluid consistency: Thin  Skin:  Reviewed, no issues  Last BM:  03/18/2015  Height:   Ht Readings from Last 1 Encounters:  03/17/16 '4\' 10"'$  (1.473 m)    Weight:   Wt Readings from Last 1 Encounters:  03/18/16 175 lb (79.4 kg)    Ideal Body Weight:  40.9 kg  BMI:  Body mass index is 36.58 kg/m.  Estimated Nutritional Needs:   Kcal:  2000-2400 calories  Protein:  95-119 gm  Fluid:  1.2L  EDUCATION NEEDS:   No education needs identified at this  time  Satira Anis. Yvette Olejnik, MS, RD LDN Inpatient Clinical Dietitian Pager [phone]

## 2016-03-18 NOTE — Progress Notes (Signed)
Continues on iv antibiotics,pain management in progress,remains on oxygen

## 2016-03-18 NOTE — Care Management Note (Signed)
Case Management Note  Patient Details  Name: Yvette Riley MRN: [id number] Date of Birth: May 27, 1955  Subjective/Objective:                  Met with patient to discuss discharge planning. She is only on PRN O2 at home through [facility] (confirmed) and does not have any portable tanks. She has never needed home health services in the past. She states she "sometimes goes out with friend to eat" so she may not be homebound. She lives mostly alone and has two sons- one is staying on -and- off with her. Her PCP is Dr. Lelon Huh. Her pharmacy is [facility] on [address]. She denies issues paying for meds however she does state that she is behind on O2 payments with [employer] ([employer] did not mention and patient did not give reason).  She uses cane as needed to ambulate. She has a front-wheeled walker also.   Action/Plan: I have notified RN that patient needs O2 sat assessment and I have updated Mandy with [employer]. RNCM will continue to follow.   Expected Discharge Date:                  Expected Discharge Plan:     In-House Referral:     Discharge planning Services  CM Consult  Post Acute Care Choice:  Durable Medical Equipment, Home Health Choice offered to:  Patient  DME Arranged:    DME Agency:  [facility]  HH Arranged:    [place] Agency:     Status of Service:  In process, will continue to follow  If discussed at Long Length of Stay Meetings, dates discussed:    Additional Comments:  Marshell Garfinkel, RN 03/18/2016, 11:19 AM

## 2016-03-18 NOTE — Progress Notes (Signed)
Inpatient Diabetes Program Recommendations  AACE/ADA: New Consensus Statement on Inpatient Glycemic Control (2015)  Target Ranges:  Prepandial:   less than 140 mg/dL      Peak postprandial:   less than 180 mg/dL (1-2 hours)      Critically ill patients:  140 - 180 mg/dL   Results for JOI, LEYVA (MRN [id number]) as of 03/18/2016 10:26  Ref. Range 03/18/2016 07:51  Glucose-Capillary Latest Ref Range: 65 - 99 mg/dL 240 (H)   Results for FELIX, PRATT (MRN [id number]) as of 03/18/2016 10:26  Ref. Range 02/19/2016 09:54  Hemoglobin A1C Unknown 6.0    Review of Glycemic Control  Diabetes history: DM2 Outpatient Diabetes medications: Metformin XR 1000 mg BID Current orders for Inpatient glycemic control: CBG monitoring  Inpatient Diabetes Program Recommendations: Correction (SSI): Please consider ordering CBGs with Novolog correction scale ACHS.  Thanks, Barnie Alderman, RN, MSN, CDE Diabetes Coordinator Inpatient Diabetes Program [phone] (Team Pager from 8am to 5pm)

## 2016-03-19 LAB — GLUCOSE, CAPILLARY
GLUCOSE-CAPILLARY: 222 mg/dL — AB (ref 65–99)
GLUCOSE-CAPILLARY: 198 mg/dL — AB (ref 65–99)
GLUCOSE-CAPILLARY: 234 mg/dL — AB (ref 65–99)
Glucose-Capillary: 343 mg/dL — ABNORMAL HIGH (ref 65–99)

## 2016-03-19 LAB — INFLUENZA PANEL BY PCR (TYPE A & B)
INFLAPCR: NEGATIVE
INFLBPCR: NEGATIVE

## 2016-03-19 LAB — PROCALCITONIN: Procalcitonin: 0.89 ng/mL

## 2016-03-19 LAB — HIV ANTIBODY (ROUTINE TESTING W REFLEX): HIV Screen 4th Generation wRfx: NONREACTIVE

## 2016-03-19 MED ORDER — METHYLPREDNISOLONE SODIUM SUCC 125 MG IJ SOLR
60.0000 mg | INTRAMUSCULAR | Status: DC
  Filled 2016-03-19: qty 2

## 2016-03-19 MED ORDER — VITAMIN B-12 1000 MCG PO TABS
1000.0000 ug | ORAL_TABLET | Freq: Every day | ORAL | Status: DC
  Administered 2016-03-19 – 2016-03-20 (×2): 1000 ug via ORAL
  Filled 2016-03-19 (×2): qty 1

## 2016-03-19 MED ORDER — FERROUS SULFATE 325 (65 FE) MG PO TABS
325.0000 mg | ORAL_TABLET | Freq: Two times a day (BID) | ORAL | Status: DC
  Administered 2016-03-20: 325 mg via ORAL
  Filled 2016-03-19: qty 1

## 2016-03-19 NOTE — Progress Notes (Signed)
[facility] [place] NAME: Yvette Riley    MR#:  [id number]  DATE OF BIRTH:  1955-05-06  SUBJECTIVE:  CHIEF COMPLAINT:   Chief Complaint  Patient presents with  . Shortness of Breath    Came with shortness of breath, recently diagnosed with lung mass.   Under treatment for COPD currently, as per nurse , also had some anxiety episodes where she have tachycardia and she feels short of breath but her oxygen saturation does not drop.  still have SOB and some wheezing.  REVIEW OF SYSTEMS:  CONSTITUTIONAL: No fever, fatigue or weakness.  EYES: No blurred or double vision.  EARS, NOSE, AND THROAT: No tinnitus or ear pain.  RESPIRATORY: No cough,Positive for shortness of breath, wheezing , no hemoptysis.  CARDIOVASCULAR: No chest pain, orthopnea, edema.  GASTROINTESTINAL: No nausea, vomiting, diarrhea or abdominal pain.  GENITOURINARY: No dysuria, hematuria.  ENDOCRINE: No polyuria, nocturia,  HEMATOLOGY: No anemia, easy bruising or bleeding SKIN: No rash or lesion. MUSCULOSKELETAL: No joint pain or arthritis.   NEUROLOGIC: No tingling, numbness, weakness.  PSYCHIATRY: No anxiety or depression.   ROS  DRUG ALLERGIES:   Allergies  Allergen Reactions  . Codeine Anaphylaxis  . Crestor [Rosuvastatin] Anaphylaxis  . Penicillins Anaphylaxis, Rash and Other (See Comments)    Reaction:  Tongue swelling  Has patient had a PCN reaction causing immediate rash, facial/tongue/throat swelling, SOB or lightheadedness with hypotension:  Yes   Has patient had a PCN reaction causing severe rash involving mucus membranes or skin necrosis: No Has patient had a PCN reaction that required hospitalization No Has patient had a PCN reaction occurring within the last 10 years: No If all of the above answers are "NO", then may proceed with Cephalosporin use.  . Yellow Jacket Venom [Bee Venom] Anaphylaxis  . Trazodone And Nefazodone   . Gemfibrozil Rash  .  Lipitor [Atorvastatin] Rash    VITALS:  Blood pressure 122/64, pulse 87, temperature 98.5 F (36.9 C), temperature source Oral, resp. rate 20, height '4\' 10"'$  (1.473 m), weight 79.3 kg (174 lb 12.8 oz), SpO2 97 %.  PHYSICAL EXAMINATION:  GENERAL:  61 y.o.-year-old patient lying in the bed with no acute distress.  EYES: Pupils equal, round, reactive to light and accommodation. No scleral icterus. Extraocular muscles intact.  HEENT: Head atraumatic, normocephalic. Oropharynx and nasopharynx clear.  NECK:  Supple, no jugular venous distention. No thyroid enlargement, no tenderness.  LUNGS: Normal breath sounds bilaterally, some wheezing, no crepitation. No use of accessory muscles of respiration.  CARDIOVASCULAR: S1, S2 normal. No murmurs, rubs, or gallops.  ABDOMEN: Soft, nontender, nondistended. Bowel sounds present. No organomegaly or mass.  EXTREMITIES: No pedal edema, cyanosis, or clubbing.  NEUROLOGIC: Cranial nerves II through XII are intact. Muscle strength 5/5 in all extremities. Sensation intact. Gait not checked.  PSYCHIATRIC: The patient is alert and oriented x 3.  SKIN: No obvious rash, lesion, or ulcer.   Physical Exam LABORATORY PANEL:   CBC  Recent Labs Lab 03/18/16 0503  WBC 6.4  HGB 8.4*  HCT 24.5*  PLT 262   ------------------------------------------------------------------------------------------------------------------  Chemistries   Recent Labs Lab 03/17/16 1840 03/18/16 0503  NA 134* 135  K 4.0 4.3  CL 101 104  CO2 23 21*  GLUCOSE 149* 322*  BUN 8 11  CREATININE 0.52 0.64  CALCIUM 8.4* 8.6*  AST 21  --   ALT 11*  --   ALKPHOS 69  --   BILITOT 1.5*  --    ------------------------------------------------------------------------------------------------------------------  Cardiac Enzymes  Recent Labs Lab 03/17/16 1840  TROPONINI <0.03    ------------------------------------------------------------------------------------------------------------------  RADIOLOGY:  No results found.  ASSESSMENT AND PLAN:   Active Problems:   Sepsis ([place])  1.. COPD exacerbation; slight tachycardia, elevated lactic acid ,  possible postobstructive pneumonia:   continue Levaquin, nebulizers, oxygen, small  Dose  of steroids   Influenza test is negative. 2. New diagnosis of lung cancer: Follow up with Dr. Grayland Ormond as an outpatient 3 chronic back pain: Continue pain medication 4. Macrocytic anemia     Check Vit B12 and folate.   All the records are reviewed and case discussed with Care Management/Social Workerr. Management plans discussed with the patient, family and they are in agreement.  CODE STATUS: Full  TOTAL TIME TAKING CARE OF THIS PATIENT: 35 minutes.    POSSIBLE D/C IN 1-2 DAYS, DEPENDING ON CLINICAL CONDITION.   Vaughan Basta M.D on 03/19/2016   Between 7am to 6pm - Pager - [phone]  After 6pm go to [email or web address] - password [email or web address] [facility]  Office  [phone]  CC: Primary care physician; Lelon Huh, MD  Note: This dictation was prepared with Dragon dictation along with smaller phrase technology. Any transcriptional errors that result from this process are unintentional.

## 2016-03-19 NOTE — Progress Notes (Signed)
Inpatient Diabetes Program Recommendations  AACE/ADA: New Consensus Statement on Inpatient Glycemic Control (2015)  Target Ranges:  Prepandial:   less than 140 mg/dL      Peak postprandial:   less than 180 mg/dL (1-2 hours)      Critically ill patients:  140 - 180 mg/dL   Results for Yvette Riley, Yvette Riley (MRN [id number]) as of 03/19/2016 11:24  Ref. Range 03/18/2016 07:51 03/18/2016 11:42 03/18/2016 16:42 03/18/2016 21:20 03/19/2016 07:53  Glucose-Capillary Latest Ref Range: 65 - 99 mg/dL 240 (H) 326 (H) 291 (H) 258 (H) 222 (H)   Review of Glycemic Control  Diabetes history: DM2 Outpatient Diabetes medications: Metformin XR 1000 mg BID Current orders for Inpatient glycemic control: Novolog 0-9 units TID with meals  Inpatient Diabetes Program Recommendations: Correction (SSI): Please consider ordering Novolog 0-5 units QHS for bedtime correction scale. Insulin - Meal Coverage: While inpatient and ordered steroids, please consider ordering Novolog 3 units TID with meals for meal coverage if pateint eats at least 50% of meals.  Thanks, Barnie Alderman, RN, MSN, CDE Diabetes Coordinator Inpatient Diabetes Program [phone] (Team Pager from 8am to 5pm)

## 2016-03-19 NOTE — Progress Notes (Signed)
Influenza negative,continues on iv antibiotics,pain management in progress.

## 2016-03-19 NOTE — Plan of Care (Signed)
Problem: Pain Managment: Goal: General experience of comfort will improve Outcome: Not Progressing Pt continues to have chronic lower back pain, receiving oxycodone, with some relief

## 2016-03-20 LAB — CBC
HCT: 26.9 % — ABNORMAL LOW (ref 35.0–47.0)
Hemoglobin: 9.5 g/dL — ABNORMAL LOW (ref 12.0–16.0)
MCH: 41.6 pg — AB (ref 26.0–34.0)
MCHC: 35.4 g/dL (ref 32.0–36.0)
MCV: 117.5 fL — ABNORMAL HIGH (ref 80.0–100.0)
PLATELETS: 370 10*3/uL (ref 150–440)
RBC: 2.29 MIL/uL — AB (ref 3.80–5.20)
RDW: 19.4 % — ABNORMAL HIGH (ref 11.5–14.5)
WBC: 8.3 10*3/uL (ref 3.6–11.0)

## 2016-03-20 LAB — BASIC METABOLIC PANEL
Anion gap: 13 (ref 5–15)
BUN: 15 mg/dL (ref 6–20)
CO2: 24 mmol/L (ref 22–32)
Calcium: 9.1 mg/dL (ref 8.9–10.3)
Chloride: 86 mmol/L — ABNORMAL LOW (ref 101–111)
Creatinine, Ser: 0.67 mg/dL (ref 0.44–1.00)
GFR calc Af Amer: >60 mL/min (ref >60)
GFR calc non Af Amer: >60 mL/min (ref >60)
Glucose, Bld: 159 mg/dL — ABNORMAL HIGH (ref 65–99)
POTASSIUM: 4.2 mmol/L (ref 3.5–5.1)
SODIUM: 123 mmol/L — AB (ref 135–145)

## 2016-03-20 LAB — FOLATE: Folate: 13.9 ng/mL (ref >5.9)

## 2016-03-20 LAB — VITAMIN B12: VITAMIN B 12: 166 pg/mL — AB (ref 180–914)

## 2016-03-20 LAB — GLUCOSE, CAPILLARY
Glucose-Capillary: 240 mg/dL — ABNORMAL HIGH (ref 65–99)
Glucose-Capillary: 164 mg/dL — ABNORMAL HIGH (ref 65–99)

## 2016-03-20 LAB — PROCALCITONIN

## 2016-03-20 MED ORDER — ALPRAZOLAM 0.25 MG PO TABS: 0.2500 mg | ORAL_TABLET | Freq: Three times a day (TID) | ORAL | 0 refills | Status: DC | PRN

## 2016-03-20 MED ORDER — LEVOFLOXACIN 500 MG PO TABS: 750.0000 mg | ORAL_TABLET | Freq: Every day | ORAL | Status: DC

## 2016-03-20 MED ORDER — KETOTIFEN FUMARATE 0.025 % OP SOLN: 1.0000 [drp] | Freq: Two times a day (BID) | OPHTHALMIC | 0 refills | Status: DC

## 2016-03-20 MED ORDER — PREDNISONE 10 MG (21) PO TBPK: ORAL_TABLET | ORAL | 0 refills | Status: DC

## 2016-03-20 MED ORDER — LEVOFLOXACIN 750 MG PO TABS: 750.0000 mg | ORAL_TABLET | Freq: Every day | ORAL | 0 refills | Status: DC

## 2016-03-20 MED ORDER — FERROUS SULFATE 325 (65 FE) MG PO TABS: 325.0000 mg | ORAL_TABLET | Freq: Two times a day (BID) | ORAL | 3 refills | Status: DC

## 2016-03-20 MED ORDER — CYANOCOBALAMIN 1000 MCG PO TABS: 1000.0000 ug | ORAL_TABLET | Freq: Every day | ORAL | 0 refills | Status: DC

## 2016-03-20 NOTE — Progress Notes (Signed)
PHARMACIST - PHYSICIAN COMMUNICATION DR:   Anselm Jungling CONCERNING: Antibiotic IV to Oral Route Change Policy  RECOMMENDATION: This patient is receiving levofloxacin by the intravenous route.  Based on criteria approved by the Pharmacy and Therapeutics Committee, the antibiotic(s) is/are being converted to the equivalent oral dose form(s).   DESCRIPTION: These criteria include:  Patient being treated for a respiratory tract infection, urinary tract infection, cellulitis or clostridium difficile associated diarrhea if on metronidazole  The patient is not neutropenic and does not exhibit a GI malabsorption state  The patient is eating (either orally or via tube) and/or has been taking other orally administered medications for a least 24 hours  The patient is improving clinically and has a Tmax < 100.5  If you have questions about this conversion, please contact the Pharmacy Department  '[]'$   [phone] )  [facility] '[x]'$   [phone] )  [facility] '[]'$   [phone] )  [facility] '[]'$   [phone] )  [facility] '[]'$   [phone] )  [facility]

## 2016-03-20 NOTE — Progress Notes (Signed)
Patient discharged via wheelchair and private vehicle. IV removed and catheter intact. All discharge instructions given and patient verbalizes understanding. Tele removed and returned. No prescriptions given to patient No distress noted.   

## 2016-03-20 NOTE — Progress Notes (Signed)
Patient refuses bed alarm and chair alarm educated that we use these measures for safety and since she is taking narcotics we would like for her to be safe and have the bed alarm and chair alarm. She still refuses.

## 2016-03-20 NOTE — Progress Notes (Signed)
Inpatient Diabetes Program Recommendations  AACE/ADA: New Consensus Statement on Inpatient Glycemic Control (2015)  Target Ranges:  Prepandial:   less than 140 mg/dL      Peak postprandial:   less than 180 mg/dL (1-2 hours)      Critically ill patients:  140 - 180 mg/dL   Results for KRISS, PERLEBERG (MRN [id number]) as of 03/20/2016 10:50  Ref. Range 03/19/2016 07:53 03/19/2016 11:52 03/19/2016 16:35 03/19/2016 21:17 03/20/2016 08:01  Glucose-Capillary Latest Ref Range: 65 - 99 mg/dL 222 (H) 343 (H) 198 (H) 234 (H) 240 (H)    Outpatient Diabetes meds: Metformin XR 1000 mg BID  Current Orders: Novolog 0-9 units TID with meals    MD- Please consider the following in-hospital insulin adjustments while patient getting Solumedrol:  1. Add bedtime correction scale /SSI to current SSI regimen  2. Start Novolog Meal Coverage: Novolog 3 units TID with meals (hold if pt eats <50% of meal)     --Will follow patient during hospitalization--  Wyn Quaker RN, MSN, CDE Diabetes Coordinator Inpatient Glycemic Control Team Team Pager: [phone] (8a-5p)

## 2016-03-20 NOTE — Discharge Summary (Signed)
[facility] [place] NAME: Yvette Riley    MR#:  [id number]  DATE OF BIRTH:  1955-02-04  DATE OF ADMISSION:  03/17/2016 ADMITTING PHYSICIAN: Epifanio Lesches, MD  DATE OF DISCHARGE: 03/20/2016  PRIMARY CARE PHYSICIAN: Lelon Huh, MD    ADMISSION DIAGNOSIS:  COPD exacerbation ([place]) [J44.1]  DISCHARGE DIAGNOSIS:  Active Problems:   Sepsis ([place])   copd   Macrocytic anemia  SECONDARY DIAGNOSIS:   Past Medical History:  Diagnosis Date  . Anemia   . Asthma   . CAP (community acquired pneumonia) 03/21/2015  . COPD (chronic obstructive pulmonary disease) ([place])   . Diabetes mellitus without complication ([place])   . Displacement of lumbar intervertebral disc without myelopathy   . Emphysema of lung ([employer])   . History of chicken pox   . Shortness of breath     HOSPITAL COURSE:   1..COPD exacerbation; slight tachycardia, elevated lactic acid ,  possible postobstructive pneumonia:   continue Levaquin, nebulizers, oxygen, small Dose of steroids   Influenza test is negative. 2.New diagnosis of lung cancer: Follow up with Dr. Grayland Ormond as an outpatient 3 chronic back pain: Continue pain medication 4. Macrocytic anemia     Check Vit B12 and folate.   Vit B12- is low, give iron and B12 oral. 5. Anxiety   This is playing major role in her " feeling SOB"    Oxygen saturations is > 90 %.   [place] , give ativan oral and she is on welbutrin.  DISCHARGE CONDITIONS:   Stable.  CONSULTS OBTAINED:    DRUG ALLERGIES:   Allergies  Allergen Reactions  . Codeine Anaphylaxis  . Crestor [Rosuvastatin] Anaphylaxis  . Penicillins Anaphylaxis, Rash and Other (See Comments)    Reaction:  Tongue swelling  Has patient had a PCN reaction causing immediate rash, facial/tongue/throat swelling, SOB or lightheadedness with hypotension:  Yes   Has patient had a PCN reaction causing severe rash involving mucus membranes or skin  necrosis: No Has patient had a PCN reaction that required hospitalization No Has patient had a PCN reaction occurring within the last 10 years: No If all of the above answers are "NO", then may proceed with Cephalosporin use.  . [place] Venom [Bee Venom] Anaphylaxis  . Trazodone And Nefazodone   . Gemfibrozil Rash  . Lipitor [Atorvastatin] Rash    DISCHARGE MEDICATIONS:   Current Discharge Medication List    START taking these medications   Details  ALPRAZolam (XANAX) 0.25 MG tablet Take 1 tablet (0.25 mg total) by mouth 3 (three) times daily as needed for anxiety. Qty: 30 tablet, Refills: 0    ferrous sulfate 325 (65 FE) MG tablet Take 1 tablet (325 mg total) by mouth 2 (two) times daily with a meal. Qty: 30 tablet, Refills: 3    ketotifen (ALLERGY EYE DROPS) 0.025 % ophthalmic solution Place 1 drop into both eyes 2 (two) times daily. Qty: 5 mL, Refills: 0    levofloxacin (LEVAQUIN) 750 MG tablet Take 1 tablet (750 mg total) by mouth daily at 6 PM. Qty: 3 tablet, Refills: 0    predniSONE (STERAPRED UNI-PAK 21 TAB) 10 MG (21) TBPK tablet Take 6 tabs first day, 5 tab on day 2, then 4 on day 3rd, 3 tabs on day 4th , 2 tab on day 5th, and 1 tab on 6th day. Qty: 21 tablet, Refills: 0    vitamin B-12 1000 MCG tablet Take 1 tablet (1,000 mcg total) by  mouth daily. Qty: 30 tablet, Refills: 0      CONTINUE these medications which have NOT CHANGED   Details  albuterol (PROVENTIL) (2.5 MG/3ML) 0.083% nebulizer solution Take 3 mLs (2.5 mg total) by nebulization every 6 (six) hours as needed for wheezing or shortness of breath. Qty: 150 mL, Refills: 1    aspirin EC 81 MG tablet Take 81 mg by mouth at bedtime.    buPROPion (WELLBUTRIN SR) 150 MG 12 hr tablet 1 tablet daily for 3 days, then 1 tablet twice daily. Stop smoking 14 days after starting medication Qty: 60 tablet, Refills: 3   Associated Diagnoses: Tobacco use    levalbuterol (XOPENEX HFA) 45 MCG/ACT inhaler Inhale 1-2  puffs into the lungs every 4 (four) hours as needed for wheezing or shortness of breath. Qty: 1 Inhaler, Refills: 12   Associated Diagnoses: Chronic obstructive pulmonary disease, unspecified COPD type (HCC)    meclizine (ANTIVERT) 25 MG tablet TAKE ONE TABLET BY MOUTH THREE TIMES DAILY AS NEEDED FOR DIZZINESS Qty: 30 tablet, Refills: 5   Associated Diagnoses: Vertigo    meloxicam (MOBIC) 15 MG tablet Take 1 tablet (15 mg total) by mouth daily as needed for pain. Qty: 90 tablet, Refills: 2    metFORMIN (GLUCOPHAGE XR) 500 MG 24 hr tablet Take 2 tablets (1,000 mg total) by mouth 2 (two) times daily. With meals Qty: 360 tablet, Refills: 4    oxyCODONE-acetaminophen (PERCOCET) 10-325 MG tablet 1 tablet every 4-6 hours as needed Qty: 180 tablet, Refills: 0    zolpidem (AMBIEN) 10 MG tablet TAKE ONE TABLET BY MOUTH ONCE DAILY AT BEDTIME Qty: 30 tablet, Refills: 4    fluticasone-salmeterol (ADVAIR HFA) 230-21 MCG/ACT inhaler Inhale 2 puffs into the lungs 2 (two) times daily. Qty: 1 Inhaler, Refills: 6    gemfibrozil (LOPID) 600 MG tablet Take 1 tablet (600 mg total) by mouth 2 (two) times daily before a meal. Qty: 60 tablet, Refills: 1   Associated Diagnoses: Hypertriglyceridemia    glucose blood test strip Check blood sugar daily. Dx Type 2 diabetes E11.9 Qty: 100 each, Refills: 3    ketoconazole (NIZORAL) 2 % cream Apply 1 application topically daily. Qty: 60 g, Refills: 0    tiotropium (SPIRIVA) 18 MCG inhalation capsule Place 1 capsule (18 mcg total) into inhaler and inhale daily. Qty: 30 capsule, Refills: 6         DISCHARGE INSTRUCTIONS:    Follow with pmd in 1 week.  If you experience worsening of your admission symptoms, develop shortness of breath, life threatening emergency, suicidal or homicidal thoughts you must seek medical attention immediately by calling 911 or calling your MD immediately  if symptoms less severe.  You Must read complete instructions/literature  along with all the possible adverse reactions/side effects for all the Medicines you take and that have been prescribed to you. Take any new Medicines after you have completely understood and accept all the possible adverse reactions/side effects.   Please note  You were cared for by a hospitalist during your hospital stay. If you have any questions about your discharge medications or the care you received while you were in the hospital after you are discharged, you can call the unit and asked to speak with the hospitalist on call if the hospitalist that took care of you is not available. Once you are discharged, your primary care physician will handle any further medical issues. Please note that NO REFILLS for any discharge medications will be authorized once you  are discharged, as it is imperative that you return to your primary care physician (or establish a relationship with a primary care physician if you do not have one) for your aftercare needs so that they can reassess your need for medications and monitor your lab values.   Today   CHIEF COMPLAINT:   Chief Complaint  Patient presents with  . Shortness of Breath    HISTORY OF PRESENT ILLNESS:  Yvette Riley  is a 61 y.o. female with a known history of COPD, recently diagnosed lung cancer, chronic back pain comes because of shortness of breath, low-grade temperature at home shortness of breath is worse than usual.   VITAL SIGNS:  Blood pressure 130/75, pulse 99, temperature 97.9 F (36.6 C), temperature source Oral, resp. rate 14, height '4\' 10"'$  (1.473 m), weight 78.3 kg (172 lb 9.6 oz), SpO2 98 %.  I/O:   Intake/Output Summary (Last 24 hours) at 03/20/16 1249 Last data filed at 03/20/16 0950  Gross per 24 hour  Intake              640 ml  Output             1450 ml  Net             -810 ml    PHYSICAL EXAMINATION:   GENERAL:  61 y.o.-year-old patient lying in the bed with no acute distress.  EYES: Pupils equal, round,  reactive to light and accommodation. No scleral icterus. Extraocular muscles intact.  HEENT: Head atraumatic, normocephalic. Oropharynx and nasopharynx clear.  NECK:  Supple, no jugular venous distention. No thyroid enlargement, no tenderness.  LUNGS: Normal breath sounds bilaterally, some wheezing, no crepitation. No use of accessory muscles of respiration.  CARDIOVASCULAR: S1, S2 normal. No murmurs, rubs, or gallops.  ABDOMEN: Soft, nontender, nondistended. Bowel sounds present. No organomegaly or mass.  EXTREMITIES: No pedal edema, cyanosis, or clubbing.  NEUROLOGIC: Cranial nerves II through XII are intact. Muscle strength 5/5 in all extremities. Sensation intact. Gait not checked.  PSYCHIATRIC: The patient is alert and oriented x 3.  SKIN: No obvious rash, lesion, or ulcer.    DATA REVIEW:   CBC  Recent Labs Lab 03/20/16 0606  WBC 8.3  HGB 9.5*  HCT 26.9*  PLT 370    Chemistries   Recent Labs Lab 03/17/16 1840  03/20/16 0606  NA 134*  < > 123*  K 4.0  < > 4.2  CL 101  < > 86*  CO2 23  < > 24  GLUCOSE 149*  < > 159*  BUN 8  < > 15  CREATININE 0.52  < > 0.67  CALCIUM 8.4*  < > 9.1  AST 21  --   --   ALT 11*  --   --   ALKPHOS 69  --   --   BILITOT 1.5*  --   --   < > = values in this interval not displayed.  Cardiac Enzymes  Recent Labs Lab 03/17/16 1840  TROPONINI <0.03    Microbiology Results  Results for orders placed or performed during the hospital encounter of 03/17/16  Blood culture (routine x 2)     Status: None (Preliminary result)   Collection Time: 03/17/16  3:52 PM  Result Value Ref Range Status   Specimen Description BLOOD LEFT ASSIST CONTROL  Final   Special Requests BOTTLES DRAWN AEROBIC AND ANAEROBIC AER7ML ANA8ML  Final   Culture NO GROWTH 3 DAYS  Final   Report  Status PENDING  Incomplete  Blood culture (routine x 2)     Status: None (Preliminary result)   Collection Time: 03/17/16  3:54 PM  Result Value Ref Range Status   Specimen  Description BLOOD RIGHT ASSIST CONTROL  Final   Special Requests BOTTLES DRAWN AEROBIC AND ANAEROBIC ANA10ML AER8ML  Final   Culture NO GROWTH 3 DAYS  Final   Report Status PENDING  Incomplete    RADIOLOGY:  No results found.  EKG:   Orders placed or performed during the hospital encounter of 03/17/16  . ED EKG  . ED EKG  . EKG 12-Lead  . EKG 12-Lead      Management plans discussed with the patient, family and they are in agreement.  CODE STATUS:     Code Status Orders        Start     Ordered   03/17/16 2000  Full code  Continuous     03/17/16 2001    Code Status History    Date Active Date Inactive Code Status Order ID Comments User Context   03/29/2015  7:39 PM 03/31/2015  5:39 PM Full Code [id number]  Fritzi Mandes, MD Inpatient   03/21/2015 11:56 PM 03/23/2015  4:46 PM Full Code [id number]  Lance Coon, MD Inpatient   06/12/2014  8:07 AM 06/12/2014  7:04 PM Full Code [id number]  Juluis Mire, MD Inpatient      TOTAL TIME TAKING CARE OF THIS PATIENT: 35 minutes.    Vaughan Basta M.D on 03/20/2016 at 12:49 PM  Between 7am to 6pm - Pager - [phone]  After 6pm go to [email or web address] - password [email or web address] [facility]  Office  [phone]  CC: Primary care physician; Lelon Huh, MD   Note: This dictation was prepared with Dragon dictation along with smaller phrase technology. Any transcriptional errors that result from this process are unintentional.

## 2016-03-20 NOTE — Discharge Instructions (Signed)
Chronic Obstructive Pulmonary Disease Chronic obstructive pulmonary disease (COPD) is a common lung problem. In COPD, the flow of air from the lungs is limited. The way your lungs work will probably never return to normal, but there are things you can do to improve your lungs and make yourself feel better. Your doctor may treat your condition with:  Medicines.  Oxygen.  Lung surgery.  Changes to your diet.  Rehabilitation. This may involve a team of specialists. Follow these instructions at home:  Take all medicines as told by your doctor.  Avoid medicines or cough syrups that dry up your airway (such as antihistamines) and do not allow you to get rid of thick spit. You do not need to avoid them if told differently by your doctor.  If you smoke, stop. Smoking makes the problem worse.  Avoid being around things that make your breathing worse (like smoke, chemicals, and fumes).  Use oxygen therapy and therapy to help improve your lungs (pulmonary rehabilitation) if told by your doctor. If you need home oxygen therapy, ask your doctor if you should buy a tool to measure your oxygen level (oximeter).  Avoid people who have a sickness you can catch (contagious).  Avoid going outside when it is very hot, cold, or humid.  Eat healthy foods. Eat smaller meals more often. Rest before meals.  Stay active, but remember to also rest.  Make sure to get all the shots (vaccines) your doctor recommends. Ask your doctor if you need a pneumonia shot.  Learn and use tips on how to relax.  Learn and use tips on how to control your breathing as told by your doctor. Try: 1. Breathing in (inhaling) through your nose for 1 second. Then, pucker your lips and breath out (exhale) through your lips for 2 seconds. 2. Putting one hand on your belly (abdomen). Breathe in slowly through your nose for 1 second. Your hand on your belly should move out. Pucker your lips and breathe out slowly through your lips.  Your hand on your belly should move in as you breathe out.  Learn and use controlled coughing to clear thick spit from your lungs. The steps are: 1. Lean your head a little forward. 2. Breathe in deeply. 3. Try to hold your breath for 3 seconds. 4. Keep your mouth slightly open while coughing 2 times. 5. Spit any thick spit out into a tissue. 6. Rest and do the steps again 1 or 2 times as needed. Contact a doctor if:  You cough up more thick spit than usual.  There is a change in the color or thickness of the spit.  It is harder to breathe than usual.  Your breathing is faster than usual. Get help right away if:  You have shortness of breath while resting.  You have shortness of breath that stops you from:  Being able to talk.  Doing normal activities.  You chest hurts for longer than 5 minutes.  Your skin color is more blue than usual.  Your pulse oximeter shows that you have low oxygen for longer than 5 minutes. This information is not intended to replace advice given to you by your health care provider. Make sure you discuss any questions you have with your health care provider. Document Released: 07/03/2007 Document Revised: 06/22/2015 Document Reviewed: 09/10/2012 Elsevier Interactive Patient Education  2017 [employer].

## 2016-03-20 NOTE — Plan of Care (Signed)
Problem: Pain Managment: Goal: General experience of comfort will improve Outcome: Not Progressing Pt with chronic back pain, treating with oxycodone.

## 2016-03-22 ENCOUNTER — Other Ambulatory Visit: Payer: Self-pay | Admitting: Emergency Medicine

## 2016-03-22 LAB — CULTURE, BLOOD (ROUTINE X 2)
CULTURE: NO GROWTH
Culture: NO GROWTH

## 2016-03-22 NOTE — Telephone Encounter (Signed)
Pt is going out of town tomorrow and will need this by 5 today. Advised the normal refill policy. Pt understands verbally.

## 2016-03-22 NOTE — Telephone Encounter (Signed)
It is too early. She had a month's supply filled on 02/24/2016 and another months supply on 03/09/2016.

## 2016-03-25 NOTE — Telephone Encounter (Signed)
See above=aa

## 2016-03-25 NOTE — Telephone Encounter (Signed)
Pt advised. She is going to call back closer to 04/06/2016  Thanks,   -Mickel Baas

## 2016-03-27 ENCOUNTER — Telehealth: Payer: Self-pay | Admitting: *Deleted

## 2016-03-27 MED ORDER — UMECLIDINIUM-VILANTEROL 62.5-25 MCG/INH IN AEPB: 1.0000 | INHALATION_SPRAY | Freq: Every day | RESPIRATORY_TRACT | 4 refills | Status: DC

## 2016-03-27 NOTE — Telephone Encounter (Signed)
Per DR, change pt's inhaler to Anoro since Spiriva HH is over $200. Pt informed. RX sent. Nothing further needed.

## 2016-03-28 ENCOUNTER — Ambulatory Visit: Payer: Medicare Other | Attending: Internal Medicine

## 2016-03-28 ENCOUNTER — Other Ambulatory Visit: Payer: Self-pay | Admitting: Family Medicine

## 2016-03-28 DIAGNOSIS — J449 Chronic obstructive pulmonary disease, unspecified: Secondary | ICD-10-CM | POA: Diagnosis not present

## 2016-03-28 DIAGNOSIS — J441 Chronic obstructive pulmonary disease with (acute) exacerbation: Secondary | ICD-10-CM | POA: Diagnosis not present

## 2016-03-28 MED ORDER — ZOLPIDEM TARTRATE 10 MG PO TABS: 10.0000 mg | ORAL_TABLET | Freq: Every day | ORAL | 4 refills | Status: DC

## 2016-03-28 MED ORDER — OXYCODONE-ACETAMINOPHEN 10-325 MG PO TABS: ORAL_TABLET | ORAL | 0 refills | Status: DC

## 2016-03-28 MED ORDER — ALBUTEROL SULFATE (2.5 MG/3ML) 0.083% IN NEBU
2.5000 mg | INHALATION_SOLUTION | Freq: Once | RESPIRATORY_TRACT | Status: AC
  Administered 2016-03-28: 2.5 mg via RESPIRATORY_TRACT
  Filled 2016-03-28: qty 3

## 2016-03-28 NOTE — Telephone Encounter (Signed)
Please review. I tried to pull down the medications in the meds and orders but it is blocked by someone at [facility] in respiratory department-looks like someone there is in that area of her chart. Please review-aa

## 2016-03-28 NOTE — Telephone Encounter (Signed)
Ambien called to pharmacy bur instructed to take prior to bedtime ED

## 2016-03-28 NOTE — Telephone Encounter (Signed)
Please call in zolpidem  

## 2016-03-28 NOTE — Addendum Note (Signed)
Addended by: Birdie Sons on: 03/28/2016 02:52 PM   Modules accepted: Orders

## 2016-03-28 NOTE — Telephone Encounter (Signed)
Pt needs refill on her   oxyCODONE-acetaminophen (PERCOCET) 10-325 MG tablet  zolpidem (AMBIEN) 10 MG tablet  Thank sTeri

## 2016-04-03 NOTE — Progress Notes (Signed)
[facility] Consultation      Assessment and Plan:  Mediastinal and hilar lymphadenopathy.  --Discussed need for EBUS biopsy,  Her breathing is now better and her COPD is now better controlled.  --Will scheduled for EBUS.   COPD --Anoro puff per day. Continue xopenex. --Pt will continue to follow up with Dr. Raul Del as her regular pulmonologist.   Nicotine abuse.  --She has stopped smoking and is congratulated. She is urged not to restart.     Date: 04/03/2016  MRN# [id number] Yvette Riley 61  Referring Physician: Dr. Grayland Ormond.   Yvette Riley is a 61 y.o. old female seen in consultation for chief complaint of:    Chief Complaint  Patient presents with  . Follow-up    PFT results: SOB worse even when sitting: prod cough w/yellow mucus    HPI:   The patient is a 61 yo female smoker with a history of COPD who normally sees Dr. Raul Del, she is on Anoro. She was sent for CT lung cancer screening on 02/27/16. Review of these films shows a midline precarinal lymph node, as well as nodes in the 11R, 7 stations. Review of previous Xrays shows emphysema.  At her last she was not feeling well and wheezing, she was advised smoking cessation. She was then admitted to the hospital on 03/17/16 for AECOPD for 3 days.  Since her last visit she has been having increasing trouble being active, she has been falling. She has been scared to be by herself and has moved in with her son.  She has stopped smoking completely since her last visit. She is currently on anoro once daily.   She is using oxygen 2L at night.   Sat at [facility] 04/04/16; @ RA at rest is 97% and HR 94; Ambulated slowly with a cane. After 180 feet sat was 92%, HR 110.   Review of tracings, PLT 03/29/16: FEV is 63% of predicted, there is no CVA reversibility with bronchodilator. FVC is 62% of predicted. Ratio is 74%. RV to TLC ratio is elevated, TLC is 84%. DLCO 79%. Overall this test is consistent with  mild restrictive lung disease, there is slight air trapping which may be indicative of mild COPD.   Medication:   Reviewed.     Allergies:  Codeine; Crestor [rosuvastatin]; Penicillins; Yellow jacket venom [bee venom]; Trazodone and nefazodone; Gemfibrozil; and Lipitor [atorvastatin]  Review of Systems: Gen:  Denies  fever, sweats, chills HEENT: Denies blurred vision, double vision. bleeds, sore throat Cvc:  No dizziness, chest pain. Resp:   Denies cough or sputum production, shortness of breath Gi: Denies swallowing difficulty, stomach pain. Gu:  Denies bladder incontinence, burning urine Ext:   No Joint pain, stiffness. Skin: No skin rash,  hives  Endoc:  No polyuria, polydipsia. Psych: No depression, insomnia. Other:  All other systems were reviewed with the patient and were negative other that what is mentioned in the HPI.   Physical Examination:   VS: BP 126/70 (BP Location: Left Arm, Cuff Size: Normal)   Pulse 97   Wt 178 lb (80.7 kg)   SpO2 98%   BMI 37.20 kg/m   General Appearance: No distress  Neuro:without focal findings,  speech normal,  HEENT: PERRLA, EOM intact.   Pulmonary: decreased air entry bilaterally.  CardiovascularNormal S1,S2.  No m/r/g.   Abdomen: Benign, Soft, non-tender. Renal:  No costovertebral tenderness  GU:  No performed at this time. Endoc: No evident thyromegaly, no signs of acromegaly.  Skin:   warm, no rashes, no ecchymosis  Extremities: normal, no cyanosis, clubbing.  Other findings:    LABORATORY PANEL:   CBC No results for input(s): WBC, HGB, HCT, PLT in the last 168 hours. ------------------------------------------------------------------------------------------------------------------  Chemistries  No results for input(s): NA, K, CL, CO2, GLUCOSE, BUN, CREATININE, CALCIUM, MG, AST, ALT, ALKPHOS, BILITOT in the last 168 hours.  Invalid input(s):  GFRCGP ------------------------------------------------------------------------------------------------------------------  Cardiac Enzymes No results for input(s): TROPONINI in the last 168 hours. ------------------------------------------------------------  RADIOLOGY:  No results found.     Thank  you for the consultation and for allowing [facility] to assist in the care of your patient. Our recommendations are noted above.  Please contact us if we can be of further service.   Marda Stalker, MD.  Board Certified in Internal Medicine, Pulmonary Medicine, [place], and Sleep Medicine.  [facility] Office Number: [phone]  Patricia Pesa, M.D.  Vilinda Boehringer, M.D.  Merton Border, M.D  04/03/2016

## 2016-04-04 ENCOUNTER — Encounter: Payer: Self-pay | Admitting: Internal Medicine

## 2016-04-04 ENCOUNTER — Ambulatory Visit (INDEPENDENT_AMBULATORY_CARE_PROVIDER_SITE_OTHER): Payer: Medicare Other | Admitting: Internal Medicine

## 2016-04-04 ENCOUNTER — Other Ambulatory Visit: Payer: Self-pay | Admitting: Family Medicine

## 2016-04-04 VITALS — BP 126/70 | HR 97 | Wt 178.0 lb

## 2016-04-04 DIAGNOSIS — J439 Emphysema, unspecified: Secondary | ICD-10-CM | POA: Diagnosis not present

## 2016-04-04 DIAGNOSIS — J441 Chronic obstructive pulmonary disease with (acute) exacerbation: Secondary | ICD-10-CM

## 2016-04-04 DIAGNOSIS — R918 Other nonspecific abnormal finding of lung field: Secondary | ICD-10-CM | POA: Diagnosis not present

## 2016-04-04 DIAGNOSIS — R590 Localized enlarged lymph nodes: Secondary | ICD-10-CM

## 2016-04-04 MED ORDER — MELOXICAM 15 MG PO TABS: 15.0000 mg | ORAL_TABLET | Freq: Every day | ORAL | 2 refills | Status: DC | PRN

## 2016-04-04 NOTE — Telephone Encounter (Signed)
Pt contacted office for refill request on the following medications:  meloxicam (MOBIC) 15 MG tablet.  [place]  SU#[phone]/FE

## 2016-04-04 NOTE — Telephone Encounter (Signed)
Please review. Thanks!  

## 2016-04-04 NOTE — Patient Instructions (Signed)
--  will schedule EBUS bronchoscopy.   --Do not start smoking again.   --Continue anoro inhaler.

## 2016-04-04 NOTE — Telephone Encounter (Signed)
Update- Pt needs this sent to [facility]. Is the preferred pharmacy. Renaldo Fiddler, CMA

## 2016-04-06 DIAGNOSIS — J449 Chronic obstructive pulmonary disease, unspecified: Secondary | ICD-10-CM | POA: Diagnosis not present

## 2016-04-06 DIAGNOSIS — Z87891 Personal history of nicotine dependence: Secondary | ICD-10-CM | POA: Diagnosis not present

## 2016-04-06 DIAGNOSIS — Z91030 Bee allergy status: Secondary | ICD-10-CM | POA: Diagnosis not present

## 2016-04-06 DIAGNOSIS — J90 Pleural effusion, not elsewhere classified: Secondary | ICD-10-CM | POA: Diagnosis not present

## 2016-04-06 DIAGNOSIS — I10 Essential (primary) hypertension: Secondary | ICD-10-CM | POA: Diagnosis not present

## 2016-04-06 DIAGNOSIS — Z880 Allergy status to penicillin: Secondary | ICD-10-CM | POA: Diagnosis not present

## 2016-04-06 DIAGNOSIS — R7989 Other specified abnormal findings of blood chemistry: Secondary | ICD-10-CM | POA: Insufficient documentation

## 2016-04-06 DIAGNOSIS — J441 Chronic obstructive pulmonary disease with (acute) exacerbation: Secondary | ICD-10-CM | POA: Diagnosis not present

## 2016-04-06 DIAGNOSIS — R0600 Dyspnea, unspecified: Secondary | ICD-10-CM | POA: Diagnosis not present

## 2016-04-06 DIAGNOSIS — Z48813 Encounter for surgical aftercare following surgery on the respiratory system: Secondary | ICD-10-CM | POA: Diagnosis not present

## 2016-04-06 DIAGNOSIS — I340 Nonrheumatic mitral (valve) insufficiency: Secondary | ICD-10-CM | POA: Diagnosis not present

## 2016-04-06 DIAGNOSIS — A0472 Enterocolitis due to Clostridium difficile, not specified as recurrent: Secondary | ICD-10-CM | POA: Diagnosis not present

## 2016-04-06 DIAGNOSIS — R791 Abnormal coagulation profile: Secondary | ICD-10-CM | POA: Diagnosis not present

## 2016-04-06 DIAGNOSIS — Z7984 Long term (current) use of oral hypoglycemic drugs: Secondary | ICD-10-CM | POA: Diagnosis not present

## 2016-04-06 DIAGNOSIS — Z7952 Long term (current) use of systemic steroids: Secondary | ICD-10-CM | POA: Diagnosis not present

## 2016-04-06 DIAGNOSIS — J188 Other pneumonia, unspecified organism: Secondary | ICD-10-CM | POA: Diagnosis not present

## 2016-04-06 DIAGNOSIS — R590 Localized enlarged lymph nodes: Secondary | ICD-10-CM | POA: Diagnosis not present

## 2016-04-06 DIAGNOSIS — Z79899 Other long term (current) drug therapy: Secondary | ICD-10-CM | POA: Diagnosis not present

## 2016-04-06 DIAGNOSIS — R531 Weakness: Secondary | ICD-10-CM | POA: Insufficient documentation

## 2016-04-06 DIAGNOSIS — R918 Other nonspecific abnormal finding of lung field: Secondary | ICD-10-CM | POA: Diagnosis not present

## 2016-04-06 DIAGNOSIS — Z792 Long term (current) use of antibiotics: Secondary | ICD-10-CM | POA: Diagnosis not present

## 2016-04-06 DIAGNOSIS — Z791 Long term (current) use of non-steroidal anti-inflammatories (NSAID): Secondary | ICD-10-CM | POA: Diagnosis not present

## 2016-04-06 DIAGNOSIS — Z9981 Dependence on supplemental oxygen: Secondary | ICD-10-CM | POA: Diagnosis not present

## 2016-04-06 DIAGNOSIS — E119 Type 2 diabetes mellitus without complications: Secondary | ICD-10-CM | POA: Diagnosis not present

## 2016-04-06 DIAGNOSIS — I313 Pericardial effusion (noninflammatory): Secondary | ICD-10-CM | POA: Diagnosis not present

## 2016-04-07 DIAGNOSIS — A0472 Enterocolitis due to Clostridium difficile, not specified as recurrent: Secondary | ICD-10-CM | POA: Insufficient documentation

## 2016-04-07 HISTORY — DX: Enterocolitis due to Clostridium difficile, not specified as recurrent: A04.72

## 2016-04-08 ENCOUNTER — Ambulatory Visit: Payer: Medicare Other | Admitting: Internal Medicine

## 2016-04-10 ENCOUNTER — Telehealth: Payer: Self-pay | Admitting: *Deleted

## 2016-04-10 NOTE — Telephone Encounter (Signed)
Called pt to inform her of her PAT appt and EBUS date. Pt states she was just d/c'd from hospital in [place] and that she had a procedure "where they went in through her back for fluid". Informed pt to have records faxed to [place] and I would give them to DR. Pt states she may not need EBUS now.

## 2016-04-14 DIAGNOSIS — Z7951 Long term (current) use of inhaled steroids: Secondary | ICD-10-CM | POA: Diagnosis not present

## 2016-04-14 DIAGNOSIS — E119 Type 2 diabetes mellitus without complications: Secondary | ICD-10-CM | POA: Diagnosis not present

## 2016-04-14 DIAGNOSIS — A0472 Enterocolitis due to Clostridium difficile, not specified as recurrent: Secondary | ICD-10-CM | POA: Diagnosis not present

## 2016-04-14 DIAGNOSIS — Z7984 Long term (current) use of oral hypoglycemic drugs: Secondary | ICD-10-CM | POA: Diagnosis not present

## 2016-04-14 DIAGNOSIS — Z87891 Personal history of nicotine dependence: Secondary | ICD-10-CM | POA: Diagnosis not present

## 2016-04-14 DIAGNOSIS — J441 Chronic obstructive pulmonary disease with (acute) exacerbation: Secondary | ICD-10-CM | POA: Diagnosis not present

## 2016-04-14 DIAGNOSIS — I10 Essential (primary) hypertension: Secondary | ICD-10-CM | POA: Diagnosis not present

## 2016-04-14 DIAGNOSIS — R918 Other nonspecific abnormal finding of lung field: Secondary | ICD-10-CM | POA: Diagnosis not present

## 2016-04-14 DIAGNOSIS — Z7952 Long term (current) use of systemic steroids: Secondary | ICD-10-CM | POA: Diagnosis not present

## 2016-04-15 ENCOUNTER — Ambulatory Visit: Payer: Self-pay | Admitting: Family Medicine

## 2016-04-15 DIAGNOSIS — R599 Enlarged lymph nodes, unspecified: Secondary | ICD-10-CM | POA: Diagnosis not present

## 2016-04-15 DIAGNOSIS — R590 Localized enlarged lymph nodes: Secondary | ICD-10-CM | POA: Diagnosis not present

## 2016-04-15 DIAGNOSIS — C969 Malignant neoplasm of lymphoid, hematopoietic and related tissue, unspecified: Secondary | ICD-10-CM | POA: Diagnosis not present

## 2016-04-15 NOTE — Telephone Encounter (Signed)
Will need to results of this test to see if there were cancer cells present. Otherwise the EBUS will still be necessary.

## 2016-04-16 ENCOUNTER — Inpatient Hospital Stay: Admission: RE | Admit: 2016-04-16 | Payer: Medicare Other | Source: Ambulatory Visit

## 2016-04-16 DIAGNOSIS — J441 Chronic obstructive pulmonary disease with (acute) exacerbation: Secondary | ICD-10-CM | POA: Diagnosis not present

## 2016-04-16 DIAGNOSIS — I10 Essential (primary) hypertension: Secondary | ICD-10-CM | POA: Diagnosis not present

## 2016-04-16 DIAGNOSIS — G8929 Other chronic pain: Secondary | ICD-10-CM | POA: Diagnosis not present

## 2016-04-16 DIAGNOSIS — A0472 Enterocolitis due to Clostridium difficile, not specified as recurrent: Secondary | ICD-10-CM | POA: Diagnosis not present

## 2016-04-16 DIAGNOSIS — M549 Dorsalgia, unspecified: Secondary | ICD-10-CM | POA: Diagnosis not present

## 2016-04-16 DIAGNOSIS — E119 Type 2 diabetes mellitus without complications: Secondary | ICD-10-CM | POA: Diagnosis not present

## 2016-04-16 NOTE — Telephone Encounter (Signed)
Pt informed to get [place] the results. States she has a f/u with the provider and will get a copy for our office. Will cancel EBUS at this time per pt. Nothing further needed.

## 2016-04-17 DIAGNOSIS — R918 Other nonspecific abnormal finding of lung field: Secondary | ICD-10-CM | POA: Diagnosis not present

## 2016-04-17 DIAGNOSIS — R590 Localized enlarged lymph nodes: Secondary | ICD-10-CM | POA: Diagnosis not present

## 2016-04-17 DIAGNOSIS — J90 Pleural effusion, not elsewhere classified: Secondary | ICD-10-CM | POA: Diagnosis not present

## 2016-04-17 NOTE — Telephone Encounter (Signed)
FYI, pt cancelled her scheduled EBUS as she was just d/c'd from hospital in [place] where she had a procedure "where they went in through her back for fluid". Informed pt to have records faxed to [place] for review.

## 2016-04-18 ENCOUNTER — Ambulatory Visit: Payer: Medicare Other | Admitting: Family Medicine

## 2016-04-19 ENCOUNTER — Encounter: Payer: Self-pay | Admitting: Family Medicine

## 2016-04-19 ENCOUNTER — Ambulatory Visit (INDEPENDENT_AMBULATORY_CARE_PROVIDER_SITE_OTHER): Payer: Medicare Other | Admitting: Family Medicine

## 2016-04-19 VITALS — BP 130/70 | HR 82 | Temp 98.9°F | Resp 18 | Wt 175.0 lb

## 2016-04-19 DIAGNOSIS — F439 Reaction to severe stress, unspecified: Secondary | ICD-10-CM | POA: Diagnosis not present

## 2016-04-19 DIAGNOSIS — M545 Low back pain: Secondary | ICD-10-CM | POA: Diagnosis not present

## 2016-04-19 DIAGNOSIS — C3490 Malignant neoplasm of unspecified part of unspecified bronchus or lung: Secondary | ICD-10-CM

## 2016-04-19 DIAGNOSIS — G8929 Other chronic pain: Secondary | ICD-10-CM

## 2016-04-19 DIAGNOSIS — I10 Essential (primary) hypertension: Secondary | ICD-10-CM | POA: Diagnosis not present

## 2016-04-19 MED ORDER — OXYCODONE-ACETAMINOPHEN 10-325 MG PO TABS: 1.0000 | ORAL_TABLET | ORAL | 0 refills | Status: DC | PRN

## 2016-04-19 NOTE — Progress Notes (Signed)
Patient: Yvette Riley Female    DOB: 07/29/1955   61 y.o.   MRN: [id number] Visit Date: 04/19/2016  Today's Provider: Lelon Huh, MD   No chief complaint on file.  Subjective:    .   Patient presents for follow up COPd, CAD, Diabetes, and chronic back pain. She was recently diagnosed with lung cancer. She has moved to [facility] and will be starting treatment at [facility], however she would like to continuing following up her for her other chronic medical problems. She is now being followed by Dr. Posey Pronto at [facility] for pulmonology. She feels fairly well today. Continues on oxycodone which she usually take every 4-5 hours throughout the day and finds to be effective. She is taking metformin consistently every day.   Lab Results  Component Value Date   HGB 9.5 (L) 03/20/2016        Allergies  Allergen Reactions  . Codeine Anaphylaxis  . Crestor [Rosuvastatin] Anaphylaxis  . Penicillins Anaphylaxis, Rash and Other (See Comments)    Reaction:  Tongue swelling  Has patient had a PCN reaction causing immediate rash, facial/tongue/throat swelling, SOB or lightheadedness with hypotension:  Yes   Has patient had a PCN reaction causing severe rash involving mucus membranes or skin necrosis: No Has patient had a PCN reaction that required hospitalization No Has patient had a PCN reaction occurring within the last 10 years: No If all of the above answers are "NO", then may proceed with Cephalosporin use.  . [place] Venom [Bee Venom] Anaphylaxis  . Trazodone And Nefazodone   . Gemfibrozil Rash  . Lipitor [Atorvastatin] Rash     Current Outpatient Prescriptions:  .  albuterol (PROVENTIL) (2.5 MG/3ML) 0.083% nebulizer solution, Take 3 mLs (2.5 mg total) by nebulization every 6 (six) hours as needed for wheezing or shortness of breath., Disp: 150 mL, Rfl: 1 .  doxycycline (VIBRAMYCIN) 100 MG capsule, Take 100 mg by mouth 2 (two) times daily., Disp: , Rfl:  .   ALPRAZolam (XANAX) 0.25 MG tablet, Take 1 tablet (0.25 mg total) by mouth 3 (three) times daily as needed for anxiety., Disp: 30 tablet, Rfl: 0 .  aspirin EC 81 MG tablet, Take 81 mg by mouth daily. , Disp: , Rfl:  .  ferrous sulfate 325 (65 FE) MG tablet, Take 1 tablet (325 mg total) by mouth 2 (two) times daily with a meal., Disp: 30 tablet, Rfl: 3 .  glucose blood test strip, Check blood sugar daily. Dx Type 2 diabetes E11.9, Disp: 100 each, Rfl: 3 .  ketoconazole (NIZORAL) 2 % cream, Apply 1 application topically daily. (Patient not taking: Reported on 04/11/2016), Disp: 60 g, Rfl: 0 .  meclizine (ANTIVERT) 25 MG tablet, TAKE ONE TABLET BY MOUTH THREE TIMES DAILY AS NEEDED FOR DIZZINESS, Disp: 30 tablet, Rfl: 5 .  meloxicam (MOBIC) 15 MG tablet, Take 1 tablet (15 mg total) by mouth daily as needed for pain., Disp: 90 tablet, Rfl: 2 .  metFORMIN (GLUCOPHAGE XR) 500 MG 24 hr tablet, Take 2 tablets (1,000 mg total) by mouth 2 (two) times daily. With meals, Disp: 360 tablet, Rfl: 4 .  oxyCODONE-acetaminophen (PERCOCET) 10-325 MG tablet, 1 tablet every 4-6 hours as needed (Patient taking differently: Take 1 tablet by mouth every 4 (four) hours as needed (for pain.). ), Disp: 180 tablet, Rfl: 0 .  umeclidinium-vilanterol (ANORO ELLIPTA) 62.5-25 MCG/INH AEPB, Inhale 1 puff into the lungs daily., Disp: 60 each, Rfl: 4 .  zolpidem (AMBIEN) 10 MG tablet, Take 1 tablet (10 mg total) by mouth daily with breakfast. (Patient taking differently: Take 10 mg by mouth at bedtime. ), Disp: 30 tablet, Rfl: 4  Review of Systems  Constitutional: Negative for appetite change, chills, fatigue and fever.  Respiratory: Negative for chest tightness and shortness of breath.   Cardiovascular: Negative for chest pain and palpitations.  Gastrointestinal: Negative for abdominal pain, nausea and vomiting.  Neurological: Negative for dizziness and weakness.    Social History  Substance Use Topics  . Smoking status: Former  Smoker    Packs/day: 0.25    Years: 45.00    Types: Cigarettes    Quit date: 03/17/2016  . Smokeless tobacco: Never Used     Comment: previously smoked 2 1/2 ppd for 30 years.   . Alcohol use No   Objective:   BP 130/70 (BP Location: Right Arm, Patient Position: Sitting, Cuff Size: Large)   Pulse 82   Temp 98.9 F (37.2 C) (Oral)   Resp 18   Wt 175 lb (79.4 kg)   SpO2 98%   BMI 36.58 kg/m  Vitals:   04/19/16 1108  BP: 130/70  Pulse: 82  Resp: 18  Temp: 98.9 F (37.2 C)  TempSrc: Oral  SpO2: 98%  Weight: 175 lb (79.4 kg)     Physical Exam  General Appearance:    Alert, cooperative, no distress, obese  Eyes:    PERRL, conjunctiva/corneas clear, EOM's intact       Lungs:     Clear to auscultation bilaterally, respirations unlabored  Heart:    Regular rate and rhythm  Neurologic:   Awake, alert, oriented x 3. No apparent focal neurological           defect.           Assessment & Plan:     1. Situational stress   2. Essential hypertension Stable. Continue current medications.    3. Chronic midline low back pain, with sciatica presence unspecified Stable on current pain medications.  - oxyCODONE-acetaminophen (PERCOCET) 10-325 MG tablet; Take 1 tablet by mouth every 4 (four) hours as needed (for pain.).  Dispense: 180 tablet; Refill: 0  4. Malignant neoplasm of lung, unspecified laterality, unspecified part of lung ([place]) Follow up with pulmonology in [place] as scheduled.   5. Type 2 diabetes.  Continue current medications.  .  Follow up about 2 months to check A1c.        Lelon Huh, MD  [facility]

## 2016-04-20 DIAGNOSIS — Z87891 Personal history of nicotine dependence: Secondary | ICD-10-CM | POA: Diagnosis not present

## 2016-04-20 DIAGNOSIS — A0472 Enterocolitis due to Clostridium difficile, not specified as recurrent: Secondary | ICD-10-CM | POA: Diagnosis not present

## 2016-04-20 DIAGNOSIS — R918 Other nonspecific abnormal finding of lung field: Secondary | ICD-10-CM | POA: Diagnosis not present

## 2016-04-20 DIAGNOSIS — I10 Essential (primary) hypertension: Secondary | ICD-10-CM | POA: Diagnosis not present

## 2016-04-20 DIAGNOSIS — E119 Type 2 diabetes mellitus without complications: Secondary | ICD-10-CM | POA: Diagnosis not present

## 2016-04-20 DIAGNOSIS — J441 Chronic obstructive pulmonary disease with (acute) exacerbation: Secondary | ICD-10-CM | POA: Diagnosis not present

## 2016-04-22 DIAGNOSIS — I10 Essential (primary) hypertension: Secondary | ICD-10-CM | POA: Diagnosis not present

## 2016-04-22 DIAGNOSIS — J441 Chronic obstructive pulmonary disease with (acute) exacerbation: Secondary | ICD-10-CM | POA: Diagnosis not present

## 2016-04-22 DIAGNOSIS — Z87891 Personal history of nicotine dependence: Secondary | ICD-10-CM | POA: Diagnosis not present

## 2016-04-22 DIAGNOSIS — R918 Other nonspecific abnormal finding of lung field: Secondary | ICD-10-CM | POA: Diagnosis not present

## 2016-04-22 DIAGNOSIS — A0472 Enterocolitis due to Clostridium difficile, not specified as recurrent: Secondary | ICD-10-CM | POA: Diagnosis not present

## 2016-04-22 DIAGNOSIS — E119 Type 2 diabetes mellitus without complications: Secondary | ICD-10-CM | POA: Diagnosis not present

## 2016-04-22 DIAGNOSIS — C3491 Malignant neoplasm of unspecified part of right bronchus or lung: Secondary | ICD-10-CM | POA: Diagnosis not present

## 2016-04-22 DIAGNOSIS — F17201 Nicotine dependence, unspecified, in remission: Secondary | ICD-10-CM | POA: Diagnosis not present

## 2016-04-23 ENCOUNTER — Telehealth: Payer: Self-pay | Admitting: *Deleted

## 2016-04-23 ENCOUNTER — Encounter: Admission: RE | Payer: Self-pay | Source: Ambulatory Visit

## 2016-04-23 ENCOUNTER — Ambulatory Visit: Admission: RE | Admit: 2016-04-23 | Payer: Medicare Other | Source: Ambulatory Visit | Admitting: Internal Medicine

## 2016-04-23 SURGERY — ENDOBRONCHIAL ULTRASOUND (EBUS)
Anesthesia: Choice

## 2016-04-23 NOTE — Telephone Encounter (Signed)
Contacted patient in follow up of her lung abnormalities that had been further evaluated at another facility. Patient reports that she does have lung cancer but does not know more details. She reports that she will be starting chemotherapy tomorrow. She verbalizes desire to move care to [place] in near future. Patient advised to notify me of timing of transfer of care so that I may assist. Verbalized understanding. I will also assist in obtaining records in preparation for this.

## 2016-04-24 DIAGNOSIS — F17201 Nicotine dependence, unspecified, in remission: Secondary | ICD-10-CM | POA: Diagnosis not present

## 2016-04-24 DIAGNOSIS — E781 Pure hyperglyceridemia: Secondary | ICD-10-CM | POA: Diagnosis not present

## 2016-04-24 DIAGNOSIS — Z7189 Other specified counseling: Secondary | ICD-10-CM | POA: Diagnosis not present

## 2016-04-24 DIAGNOSIS — Z5111 Encounter for antineoplastic chemotherapy: Secondary | ICD-10-CM | POA: Diagnosis not present

## 2016-04-24 DIAGNOSIS — C3490 Malignant neoplasm of unspecified part of unspecified bronchus or lung: Secondary | ICD-10-CM | POA: Diagnosis not present

## 2016-04-24 DIAGNOSIS — C3491 Malignant neoplasm of unspecified part of right bronchus or lung: Secondary | ICD-10-CM | POA: Diagnosis not present

## 2016-04-25 ENCOUNTER — Encounter: Payer: Self-pay | Admitting: Family Medicine

## 2016-04-25 DIAGNOSIS — Z5111 Encounter for antineoplastic chemotherapy: Secondary | ICD-10-CM | POA: Diagnosis not present

## 2016-04-25 DIAGNOSIS — C3490 Malignant neoplasm of unspecified part of unspecified bronchus or lung: Secondary | ICD-10-CM | POA: Diagnosis not present

## 2016-04-26 DIAGNOSIS — Z5111 Encounter for antineoplastic chemotherapy: Secondary | ICD-10-CM | POA: Diagnosis not present

## 2016-04-26 DIAGNOSIS — C3490 Malignant neoplasm of unspecified part of unspecified bronchus or lung: Secondary | ICD-10-CM | POA: Diagnosis not present

## 2016-04-28 DIAGNOSIS — A0472 Enterocolitis due to Clostridium difficile, not specified as recurrent: Secondary | ICD-10-CM | POA: Diagnosis not present

## 2016-04-28 DIAGNOSIS — R918 Other nonspecific abnormal finding of lung field: Secondary | ICD-10-CM | POA: Diagnosis not present

## 2016-04-28 DIAGNOSIS — Z87891 Personal history of nicotine dependence: Secondary | ICD-10-CM | POA: Diagnosis not present

## 2016-04-28 DIAGNOSIS — J441 Chronic obstructive pulmonary disease with (acute) exacerbation: Secondary | ICD-10-CM | POA: Diagnosis not present

## 2016-04-28 DIAGNOSIS — I10 Essential (primary) hypertension: Secondary | ICD-10-CM | POA: Diagnosis not present

## 2016-04-28 DIAGNOSIS — E119 Type 2 diabetes mellitus without complications: Secondary | ICD-10-CM | POA: Diagnosis not present

## 2016-04-29 ENCOUNTER — Telehealth: Payer: Self-pay

## 2016-04-29 DIAGNOSIS — E119 Type 2 diabetes mellitus without complications: Secondary | ICD-10-CM | POA: Diagnosis not present

## 2016-04-29 DIAGNOSIS — C3490 Malignant neoplasm of unspecified part of unspecified bronchus or lung: Secondary | ICD-10-CM | POA: Diagnosis not present

## 2016-04-29 DIAGNOSIS — I10 Essential (primary) hypertension: Secondary | ICD-10-CM | POA: Diagnosis not present

## 2016-04-29 DIAGNOSIS — Z79899 Other long term (current) drug therapy: Secondary | ICD-10-CM | POA: Insufficient documentation

## 2016-04-29 DIAGNOSIS — Z87891 Personal history of nicotine dependence: Secondary | ICD-10-CM | POA: Diagnosis not present

## 2016-04-29 DIAGNOSIS — A0472 Enterocolitis due to Clostridium difficile, not specified as recurrent: Secondary | ICD-10-CM | POA: Diagnosis not present

## 2016-04-29 DIAGNOSIS — J441 Chronic obstructive pulmonary disease with (acute) exacerbation: Secondary | ICD-10-CM | POA: Diagnosis not present

## 2016-04-29 DIAGNOSIS — R918 Other nonspecific abnormal finding of lung field: Secondary | ICD-10-CM | POA: Diagnosis not present

## 2016-04-29 NOTE — Telephone Encounter (Signed)
I think I would recommend seeing her for this as it may take changing some medications around to add something.   She can try adding ensure high protein and just forcing herself to drink them. Also is she having nausea that is decreasing the appetite? We can discuss in the office the different options if she is willing.

## 2016-04-29 NOTE — Telephone Encounter (Signed)
Pt advised and OV scheduled. Pt states she does not want to drink Ensure secondary to diabetes. Renaldo Fiddler, CMA,

## 2016-04-29 NOTE — Telephone Encounter (Signed)
LMTCB

## 2016-04-29 NOTE — Telephone Encounter (Signed)
Patient called and states that she has lung cancer and just finished a round of chemo. She states her appetite has decreased and she is hardly eating, just does not want to. She states she has lost 10 lbs in the past 2 weeks. No vomiting. She wants to know if she can try something OTC or RX that would help her with her appetite so she can eat. Please review. Dr fishers' patient-aa

## 2016-04-30 DIAGNOSIS — Z87891 Personal history of nicotine dependence: Secondary | ICD-10-CM | POA: Diagnosis not present

## 2016-04-30 DIAGNOSIS — C3411 Malignant neoplasm of upper lobe, right bronchus or lung: Secondary | ICD-10-CM | POA: Diagnosis not present

## 2016-04-30 DIAGNOSIS — J449 Chronic obstructive pulmonary disease, unspecified: Secondary | ICD-10-CM | POA: Diagnosis not present

## 2016-04-30 DIAGNOSIS — E119 Type 2 diabetes mellitus without complications: Secondary | ICD-10-CM | POA: Diagnosis not present

## 2016-04-30 DIAGNOSIS — Z7984 Long term (current) use of oral hypoglycemic drugs: Secondary | ICD-10-CM | POA: Diagnosis not present

## 2016-05-01 ENCOUNTER — Other Ambulatory Visit: Payer: Self-pay

## 2016-05-01 ENCOUNTER — Ambulatory Visit (INDEPENDENT_AMBULATORY_CARE_PROVIDER_SITE_OTHER): Payer: Medicare Other | Admitting: Physician Assistant

## 2016-05-01 ENCOUNTER — Encounter: Payer: Self-pay | Admitting: Physician Assistant

## 2016-05-01 ENCOUNTER — Telehealth: Payer: Self-pay | Admitting: *Deleted

## 2016-05-01 VITALS — BP 122/72 | HR 95 | Temp 99.1°F | Wt 164.2 lb

## 2016-05-01 DIAGNOSIS — Z87891 Personal history of nicotine dependence: Secondary | ICD-10-CM | POA: Diagnosis not present

## 2016-05-01 DIAGNOSIS — E119 Type 2 diabetes mellitus without complications: Secondary | ICD-10-CM | POA: Diagnosis not present

## 2016-05-01 DIAGNOSIS — C3491 Malignant neoplasm of unspecified part of right bronchus or lung: Secondary | ICD-10-CM

## 2016-05-01 DIAGNOSIS — J441 Chronic obstructive pulmonary disease with (acute) exacerbation: Secondary | ICD-10-CM | POA: Diagnosis not present

## 2016-05-01 DIAGNOSIS — E441 Mild protein-calorie malnutrition: Secondary | ICD-10-CM | POA: Diagnosis not present

## 2016-05-01 DIAGNOSIS — R630 Anorexia: Secondary | ICD-10-CM | POA: Diagnosis not present

## 2016-05-01 DIAGNOSIS — I10 Essential (primary) hypertension: Secondary | ICD-10-CM | POA: Diagnosis not present

## 2016-05-01 DIAGNOSIS — R918 Other nonspecific abnormal finding of lung field: Secondary | ICD-10-CM | POA: Diagnosis not present

## 2016-05-01 DIAGNOSIS — A0472 Enterocolitis due to Clostridium difficile, not specified as recurrent: Secondary | ICD-10-CM | POA: Diagnosis not present

## 2016-05-01 MED ORDER — GLUCERNA ADVANCE SHAKE PO LIQD: ORAL | 12 refills | Status: DC

## 2016-05-01 MED ORDER — MIRTAZAPINE 15 MG PO TABS: 15.0000 mg | ORAL_TABLET | Freq: Every day | ORAL | 0 refills | Status: DC

## 2016-05-01 NOTE — Telephone Encounter (Signed)
Called pt to give appt details. Pt stated that cannot come to appt on 4/18 and she would have to call me back. Awaiting call back from patient.

## 2016-05-01 NOTE — Progress Notes (Signed)
Patient: Yvette Riley Female    DOB: 1955-12-08   61 y.o.   MRN: [id number] Visit Date: 05/01/2016  Today's Provider: Mar Daring, PA-C   Chief Complaint  Patient presents with  . Weight Check  . Anorexia  . Nausea   Subjective:    HPI Patient is here to discuss weight loss, loss of appetite, and nausea since starting chemotherapy for lung cancer. Patient's weight today is 164.2 lb which is 11 lb decrease since last OV on 04/19/2016. Patient reports not having a appetite to eat, and has to make her self eat when she does. Next chemotherapy treatment is 04/28/2016.   She also report having some Vitamin deficiencies that have not been addressed. She reports she is suppose to be taking oral iron but has been unable to tolerate, stating "It just runs right through me." She also has B12 deficiency but has not been compliant with a B12 supplement.     Previous Medications   ALBUTEROL (PROVENTIL) (2.5 MG/3ML) 0.083% NEBULIZER SOLUTION    Take 3 mLs (2.5 mg total) by nebulization every 6 (six) hours as needed for wheezing or shortness of breath.   ALPRAZOLAM (XANAX) 0.25 MG TABLET    Take 1 tablet (0.25 mg total) by mouth 3 (three) times daily as needed for anxiety.   ASPIRIN EC 81 MG TABLET    Take 81 mg by mouth daily.    FERROUS SULFATE 325 (65 FE) MG TABLET    Take 1 tablet (325 mg total) by mouth 2 (two) times daily with a meal.   GLUCOSE BLOOD TEST STRIP    Check blood sugar daily. Dx Type 2 diabetes E11.9   KETOCONAZOLE (NIZORAL) 2 % CREAM    Apply 1 application topically daily.   MECLIZINE (ANTIVERT) 25 MG TABLET    TAKE ONE TABLET BY MOUTH THREE TIMES DAILY AS NEEDED FOR DIZZINESS   MELOXICAM (MOBIC) 15 MG TABLET    Take 1 tablet (15 mg total) by mouth daily as needed for pain.   METFORMIN (GLUCOPHAGE XR) 500 MG 24 HR TABLET    Take 2 tablets (1,000 mg total) by mouth 2 (two) times daily. With meals   MONTELUKAST (SINGULAIR) 10 MG TABLET    Take 10 mg by mouth at bedtime.   OXYCODONE-ACETAMINOPHEN (PERCOCET) 10-325 MG TABLET    Take 1 tablet by mouth every 4 (four) hours as needed (for pain.).   UMECLIDINIUM-VILANTEROL (ANORO ELLIPTA) 62.5-25 MCG/INH AEPB    Inhale 1 puff into the lungs daily.   ZOLPIDEM (AMBIEN) 10 MG TABLET    Take 1 tablet (10 mg total) by mouth daily with breakfast.    Review of Systems  Constitutional: Positive for appetite change (no appetite since starting chemo), fatigue and unexpected weight change (11 pounds in 2 weeks).  Respiratory: Negative.   Cardiovascular: Negative.   Gastrointestinal: Positive for nausea (only one day following chemo).  Neurological: Positive for weakness.    Social History  Substance Use Topics  . Smoking status: Former Smoker    Packs/day: 0.25    Years: 45.00    Types: Cigarettes    Quit date: 03/17/2016  . Smokeless tobacco: Never Used     Comment: previously smoked 2 1/2 ppd for 30 years.   . Alcohol use No   Objective:   BP 122/72 (BP Location: Right Arm, Patient Position: Sitting, Cuff Size: Normal)   Pulse 95   Temp 99.1 F (37.3 C) (Oral)   Wt 164 lb 3.2  oz (74.5 kg)   SpO2 97%   BMI 34.32 kg/m   Physical Exam  Constitutional: She appears well-developed and well-nourished. No distress.  Neck: Normal range of motion. Neck supple.  Cardiovascular: Normal rate, regular rhythm and normal heart sounds.  Exam reveals no gallop and no friction rub.   No murmur heard. Pulmonary/Chest: Effort normal and breath sounds normal. No respiratory distress. She has no wheezes. She has no rales.  Skin: She is not diaphoretic.  Psychiatric: She has a normal mood and affect.  Vitals reviewed.     Assessment & Plan:     1. Loss of appetite Will start mirtazapine as below. Advised patient and son to hold Ambien for now until we see how Mirtazapine will effect her. They voiced understanding. She is to follow up with Dr. Caryn Section in 4 weeks to see how she is tolerating and to see what is best option since  she lives in [place] now, having her oncology care at [facility], and how to best treat her vitamin deficiencies.  - mirtazapine (REMERON) 15 MG tablet; Take 1 tablet (15 mg total) by mouth at bedtime.  Dispense: 30 tablet; Refill: 0  2. Mild protein-calorie malnutrition ([place]) Rx given and coupons for her to have 1-2 Glucerna shakes per day for nutrition.  - Nutritional Supplements (GLUCERNA ADVANCE SHAKE) LIQD; Drink 1-2 shakes per day  Dispense: 24 Bottle; Refill: 12  3. Small cell carcinoma of lung, right ([place]) Followed by oncology at [facility] center in [place].    Follow up: No Follow-up on file.

## 2016-05-01 NOTE — Telephone Encounter (Signed)
-----   Message from Wallene Dales sent at 05/01/2016  1:58 PM EDT ----- Regarding: RE: Schedule Appt Pt scheduled for 4/18 @ 8:30 and 4/19 @ 2:00pm 4/20 @ 1:30.  ----- Message ----- From: Telford Nab, RN Sent: 05/01/2016  12:55 PM To: Wallene Dales Subject: Schedule Appt                                  Please schedule pt to for lab/finn/carbo, etoposide on 4/18. Etoposide only on 4/19 and 4/20. Let me know when scheduled and I will notify the patient.   Thanks!

## 2016-05-01 NOTE — Patient Instructions (Signed)
Mirtazapine tablets What is this medicine? MIRTAZAPINE (mir TAZ a peen) is used to treat depression. This medicine may be used for other purposes; ask your health care provider or pharmacist if you have questions. COMMON BRAND NAME(S): Remeron What should I tell my health care provider before I take this medicine? They need to know if you have any of these conditions: -bipolar disorder -glaucoma -kidney disease -liver disease -suicidal thoughts -an unusual or allergic reaction to mirtazapine, other medicines, foods, dyes, or preservatives -pregnant or trying to get pregnant -breast-feeding How should I use this medicine? Take this medicine by mouth with a glass of water. Follow the directions on the prescription label. Take your medicine at regular intervals. Do not take your medicine more often than directed. Do not stop taking this medicine suddenly except upon the advice of your doctor. Stopping this medicine too quickly may cause serious side effects or your condition may worsen. A special MedGuide will be given to you by the pharmacist with each prescription and refill. Be sure to read this information carefully each time. Talk to your pediatrician regarding the use of this medicine in children. Special care may be needed. Overdosage: If you think you have taken too much of this medicine contact a poison control center or emergency room at once. NOTE: This medicine is only for you. Do not share this medicine with others. What if I miss a dose? If you miss a dose, take it as soon as you can. If it is almost time for your next dose, take only that dose. Do not take double or extra doses. What may interact with this medicine? Do not take this medicine with any of the following medications: -linezolid -MAOIs like Carbex, Eldepryl, Marplan, Nardil, and Parnate -methylene blue (injected into a vein) This medicine may also interact with the following medications: -alcohol -antiviral  medicines for HIV or AIDS -certain medicines that treat or prevent blood clots like warfarin -certain medicines for depression, anxiety, or psychotic disturbances -certain medicines for fungal infections like ketoconazole and itraconazole -certain medicines for migraine headache like almotriptan, eletriptan, frovatriptan, naratriptan, rizatriptan, sumatriptan, zolmitriptan -certain medicines for seizures like carbamazepine or phenytoin -certain medicines for sleep -cimetidine -erythromycin -fentanyl -lithium -medicines for blood pressure -nefazodone -rasagiline -rifampin -supplements like St. John's wort, kava kava, valerian -tramadol -tryptophan This list may not describe all possible interactions. Give your health care provider a list of all the medicines, herbs, non-prescription drugs, or dietary supplements you use. Also tell them if you smoke, drink alcohol, or use illegal drugs. Some items may interact with your medicine. What should I watch for while using this medicine? Tell your doctor if your symptoms do not get better or if they get worse. Visit your doctor or health care professional for regular checks on your progress. Because it may take several weeks to see the full effects of this medicine, it is important to continue your treatment as prescribed by your doctor. Patients and their families should watch out for new or worsening thoughts of suicide or depression. Also watch out for sudden changes in feelings such as feeling anxious, agitated, panicky, irritable, hostile, aggressive, impulsive, severely restless, overly excited and hyperactive, or not being able to sleep. If this happens, especially at the beginning of treatment or after a change in dose, call your health care professional. You may get drowsy or dizzy. Do not drive, use machinery, or do anything that needs mental alertness until you know how this medicine affects you. Do not   stand or sit up quickly, especially if  you are an older patient. This reduces the risk of dizzy or fainting spells. Alcohol may interfere with the effect of this medicine. Avoid alcoholic drinks. This medicine may cause dry eyes and blurred vision. If you wear contact lenses you may feel some discomfort. Lubricating drops may help. See your eye doctor if the problem does not go away or is severe. Your mouth may get dry. Chewing sugarless gum or sucking hard candy, and drinking plenty of water may help. Contact your doctor if the problem does not go away or is severe. What side effects may I notice from receiving this medicine? Side effects that you should report to your doctor or health care professional as soon as possible: -allergic reactions like skin rash, itching or hives, swelling of the face, lips, or tongue -anxious -changes in vision -chest pain -confusion -elevated mood, decreased need for sleep, racing thoughts, impulsive behavior -eye pain -fast, irregular heartbeat -feeling faint or lightheaded, falls -feeling agitated, angry, or irritable -fever or chills, sore throat -hallucination, loss of contact with reality -loss of balance or coordination -mouth sores -redness, blistering, peeling or loosening of the skin, including inside the mouth -restlessness, pacing, inability to keep still -seizures -stiff muscles -suicidal thoughts or other mood changes -trouble passing urine or change in the amount of urine -trouble sleeping -unusual bleeding or bruising -unusually weak or tired -vomiting Side effects that usually do not require medical attention (report to your doctor or health care professional if they continue or are bothersome): -change in appetite -constipation -dizziness -dry mouth -muscle aches or pains -nausea -tired -weight gain This list may not describe all possible side effects. Call your doctor for medical advice about side effects. You may report side effects to FDA at 1-800-FDA-1088. Where  should I keep my medicine? Keep out of the reach of children. Store at room temperature between 15 and 30 degrees C (59 and 86 degrees F) Protect from light and moisture. Throw away any unused medicine after the expiration date. NOTE: This sheet is a summary. It may not cover all possible information. If you have questions about this medicine, talk to your doctor, pharmacist, or health care provider.  2018 Elsevier/Gold Standard (2015-06-15 17:30:45)  

## 2016-05-02 NOTE — Telephone Encounter (Signed)
Called pt today to follow up. No answer, voicemail left for patient to call back.

## 2016-05-03 DIAGNOSIS — Z888 Allergy status to other drugs, medicaments and biological substances status: Secondary | ICD-10-CM | POA: Diagnosis not present

## 2016-05-03 DIAGNOSIS — C3411 Malignant neoplasm of upper lobe, right bronchus or lung: Secondary | ICD-10-CM | POA: Diagnosis not present

## 2016-05-03 DIAGNOSIS — Z91030 Bee allergy status: Secondary | ICD-10-CM | POA: Diagnosis not present

## 2016-05-03 DIAGNOSIS — Z880 Allergy status to penicillin: Secondary | ICD-10-CM | POA: Diagnosis not present

## 2016-05-03 NOTE — Telephone Encounter (Signed)
Spoke with pt and gave appt information for 05/15/16 at 8:30am for labs, see MD, and continue with cycle #2 carbo/etoposide. Pt stated is still thinking about getting treatments in [place] vs. [place]. Informed pt that will keep her on the schedule for 4/18 if she decides to seek treatment in [place]. Pt verbalized understanding.

## 2016-05-05 DIAGNOSIS — Z87891 Personal history of nicotine dependence: Secondary | ICD-10-CM | POA: Diagnosis not present

## 2016-05-05 DIAGNOSIS — I10 Essential (primary) hypertension: Secondary | ICD-10-CM | POA: Diagnosis not present

## 2016-05-05 DIAGNOSIS — A0472 Enterocolitis due to Clostridium difficile, not specified as recurrent: Secondary | ICD-10-CM | POA: Diagnosis not present

## 2016-05-05 DIAGNOSIS — R918 Other nonspecific abnormal finding of lung field: Secondary | ICD-10-CM | POA: Diagnosis not present

## 2016-05-05 DIAGNOSIS — J441 Chronic obstructive pulmonary disease with (acute) exacerbation: Secondary | ICD-10-CM | POA: Diagnosis not present

## 2016-05-05 DIAGNOSIS — E119 Type 2 diabetes mellitus without complications: Secondary | ICD-10-CM | POA: Diagnosis not present

## 2016-05-06 DIAGNOSIS — H25813 Combined forms of age-related cataract, bilateral: Secondary | ICD-10-CM

## 2016-05-06 DIAGNOSIS — H04123 Dry eye syndrome of bilateral lacrimal glands: Secondary | ICD-10-CM | POA: Insufficient documentation

## 2016-05-06 DIAGNOSIS — H524 Presbyopia: Secondary | ICD-10-CM | POA: Insufficient documentation

## 2016-05-06 DIAGNOSIS — E119 Type 2 diabetes mellitus without complications: Secondary | ICD-10-CM | POA: Diagnosis not present

## 2016-05-06 HISTORY — DX: Combined forms of age-related cataract, bilateral: H25.813

## 2016-05-07 ENCOUNTER — Ambulatory Visit: Payer: Self-pay | Admitting: Family Medicine

## 2016-05-07 DIAGNOSIS — C3411 Malignant neoplasm of upper lobe, right bronchus or lung: Secondary | ICD-10-CM | POA: Diagnosis not present

## 2016-05-07 DIAGNOSIS — Z87891 Personal history of nicotine dependence: Secondary | ICD-10-CM | POA: Diagnosis not present

## 2016-05-07 DIAGNOSIS — Z510 Encounter for antineoplastic radiation therapy: Secondary | ICD-10-CM | POA: Diagnosis not present

## 2016-05-08 ENCOUNTER — Other Ambulatory Visit: Payer: Self-pay | Admitting: Family Medicine

## 2016-05-08 ENCOUNTER — Telehealth: Payer: Self-pay

## 2016-05-08 MED ORDER — ALPRAZOLAM 0.25 MG PO TABS: 0.2500 mg | ORAL_TABLET | Freq: Three times a day (TID) | ORAL | 5 refills | Status: DC | PRN

## 2016-05-08 NOTE — Telephone Encounter (Signed)
Patient called and states that you had stopped her daytime O2 some time back and she feels she needs to have it during the day and not just nighttime.   Can this be ordered through [facility] Thanks ED

## 2016-05-08 NOTE — Telephone Encounter (Signed)
Insurance will not pay for oxygen unless her blood oxygen levels drop below 89% percent while awake. Her best bet would be to have her pulmonologist check daytime oxymetry at her next appointment.

## 2016-05-08 NOTE — Telephone Encounter (Signed)
Please call in alprazolam.  

## 2016-05-08 NOTE — Telephone Encounter (Signed)
Pt contacted office for refill request on the following medications:  ALPRAZolam (XANAX) 0.25 MG tablet.  [address], [place] [address].  GL#[phone]/JO

## 2016-05-09 NOTE — Telephone Encounter (Signed)
Advised  ED 

## 2016-05-10 DIAGNOSIS — Z91030 Bee allergy status: Secondary | ICD-10-CM | POA: Diagnosis not present

## 2016-05-10 DIAGNOSIS — Z7982 Long term (current) use of aspirin: Secondary | ICD-10-CM | POA: Diagnosis not present

## 2016-05-10 DIAGNOSIS — I4510 Unspecified right bundle-branch block: Secondary | ICD-10-CM | POA: Diagnosis not present

## 2016-05-10 DIAGNOSIS — Z79899 Other long term (current) drug therapy: Secondary | ICD-10-CM | POA: Diagnosis not present

## 2016-05-10 DIAGNOSIS — R079 Chest pain, unspecified: Secondary | ICD-10-CM | POA: Diagnosis not present

## 2016-05-10 DIAGNOSIS — J189 Pneumonia, unspecified organism: Secondary | ICD-10-CM | POA: Diagnosis not present

## 2016-05-10 DIAGNOSIS — Z87891 Personal history of nicotine dependence: Secondary | ICD-10-CM | POA: Diagnosis not present

## 2016-05-10 DIAGNOSIS — C3401 Malignant neoplasm of right main bronchus: Secondary | ICD-10-CM | POA: Diagnosis not present

## 2016-05-10 DIAGNOSIS — R918 Other nonspecific abnormal finding of lung field: Secondary | ICD-10-CM | POA: Diagnosis not present

## 2016-05-10 DIAGNOSIS — J449 Chronic obstructive pulmonary disease, unspecified: Secondary | ICD-10-CM | POA: Diagnosis not present

## 2016-05-10 DIAGNOSIS — J910 Malignant pleural effusion: Secondary | ICD-10-CM | POA: Diagnosis not present

## 2016-05-10 DIAGNOSIS — I10 Essential (primary) hypertension: Secondary | ICD-10-CM | POA: Diagnosis not present

## 2016-05-10 DIAGNOSIS — Z791 Long term (current) use of non-steroidal anti-inflammatories (NSAID): Secondary | ICD-10-CM | POA: Diagnosis not present

## 2016-05-10 DIAGNOSIS — R072 Precordial pain: Secondary | ICD-10-CM | POA: Diagnosis not present

## 2016-05-10 DIAGNOSIS — E119 Type 2 diabetes mellitus without complications: Secondary | ICD-10-CM | POA: Diagnosis not present

## 2016-05-10 DIAGNOSIS — Z888 Allergy status to other drugs, medicaments and biological substances status: Secondary | ICD-10-CM | POA: Diagnosis not present

## 2016-05-10 DIAGNOSIS — Z880 Allergy status to penicillin: Secondary | ICD-10-CM | POA: Diagnosis not present

## 2016-05-10 DIAGNOSIS — M549 Dorsalgia, unspecified: Secondary | ICD-10-CM | POA: Diagnosis not present

## 2016-05-10 DIAGNOSIS — F419 Anxiety disorder, unspecified: Secondary | ICD-10-CM | POA: Diagnosis not present

## 2016-05-13 ENCOUNTER — Other Ambulatory Visit: Payer: Self-pay | Admitting: Oncology

## 2016-05-13 ENCOUNTER — Ambulatory Visit
Admission: RE | Admit: 2016-05-13 | Discharge: 2016-05-13 | Disposition: A | Discharge status: Home / Self Care | Payer: Medicare Other | Source: Ambulatory Visit | Attending: Family Medicine | Admitting: Family Medicine

## 2016-05-13 ENCOUNTER — Encounter: Payer: Self-pay | Admitting: Family Medicine

## 2016-05-13 ENCOUNTER — Ambulatory Visit (INDEPENDENT_AMBULATORY_CARE_PROVIDER_SITE_OTHER): Payer: Medicare Other | Admitting: Family Medicine

## 2016-05-13 VITALS — BP 136/76 | HR 92 | Temp 98.2°F | Resp 16 | Wt 171.0 lb

## 2016-05-13 DIAGNOSIS — Z09 Encounter for follow-up examination after completed treatment for conditions other than malignant neoplasm: Secondary | ICD-10-CM | POA: Insufficient documentation

## 2016-05-13 DIAGNOSIS — Z8701 Personal history of pneumonia (recurrent): Secondary | ICD-10-CM | POA: Diagnosis not present

## 2016-05-13 DIAGNOSIS — R05 Cough: Secondary | ICD-10-CM | POA: Diagnosis not present

## 2016-05-13 DIAGNOSIS — Z87891 Personal history of nicotine dependence: Secondary | ICD-10-CM | POA: Diagnosis not present

## 2016-05-13 DIAGNOSIS — J189 Pneumonia, unspecified organism: Secondary | ICD-10-CM

## 2016-05-13 DIAGNOSIS — C3491 Malignant neoplasm of unspecified part of right bronchus or lung: Secondary | ICD-10-CM | POA: Insufficient documentation

## 2016-05-13 DIAGNOSIS — C3411 Malignant neoplasm of upper lobe, right bronchus or lung: Secondary | ICD-10-CM | POA: Diagnosis not present

## 2016-05-13 DIAGNOSIS — Z510 Encounter for antineoplastic radiation therapy: Secondary | ICD-10-CM | POA: Diagnosis not present

## 2016-05-13 DIAGNOSIS — Z85118 Personal history of other malignant neoplasm of bronchus and lung: Secondary | ICD-10-CM | POA: Diagnosis not present

## 2016-05-13 NOTE — Progress Notes (Signed)
START ON PATHWAY REGIMEN - Small Cell Lung     A cycle is every 21 days:     Etoposide      Carboplatin   **Always confirm dose/schedule in your pharmacy ordering system**    Patient Characteristics: Limited Stage, First Line Stage Grouping: Limited AJCC T Category: T3 AJCC N Category: N3 AJCC M Category: M0 AJCC 8 Stage Grouping: IIIC Line of therapy: First Line Would you be surprised if this patient died  in the next year? I would NOT be surprised if this patient died in the next year  Intent of Therapy: Curative Intent, Discussed with Patient

## 2016-05-13 NOTE — Progress Notes (Signed)
Patient: Yvette Riley Female    DOB: 01/13/1956   61 y.o.   MRN: [id number] Visit Date: 05/13/2016  Today's Provider: Lelon Huh, MD   Chief Complaint  Patient presents with  . Follow-up    Pt was in the ER 05/10/2016; Treated for pneumonia.  . Pneumonia   Subjective:      Pneumonia  She complains of chest tightness, cough, shortness of breath and wheezing. There is no hemoptysis. This is a new problem. The current episode started in the past 7 days. The problem has been gradually improving. The cough is non-productive. Associated symptoms include malaise/fatigue and nasal congestion. Pertinent negatives include no appetite change, chest pain, ear congestion, ear pain, fever, headaches, postnasal drip, rhinorrhea, sneezing, sore throat or trouble swallowing. Relieved by: zpak. She reports moderate improvement on treatment. Risk factors: Lung Cancer. Her past medical history is significant for pneumonia.    She had CTA done on 05/10/2016 at [facility] with findings of  . shworsening of consolidation and infiltrate in the posterolateral right upper lobe compared to prior   Allergies  Allergen Reactions  . Codeine Anaphylaxis  . Crestor [Rosuvastatin] Anaphylaxis  . Other Anaphylaxis  . Penicillins Anaphylaxis, Rash and Other (See Comments)    Reaction:  Tongue swelling  Has patient had a PCN reaction causing immediate rash, facial/tongue/throat swelling, SOB or lightheadedness with hypotension:  Yes   Has patient had a PCN reaction causing severe rash involving mucus membranes or skin necrosis: No Has patient had a PCN reaction that required hospitalization No Has patient had a PCN reaction occurring within the last 10 years: No If all of the above answers are "NO", then may proceed with Cephalosporin use.  . Yellow Jacket Venom [Bee Venom] Anaphylaxis  . Gemfibrozil Rash, Nausea And Vomiting and Swelling  . Statins Nausea And Vomiting and Swelling  . Trazodone And  Nefazodone Nausea And Vomiting  . Lipitor [Atorvastatin] Rash     Current Outpatient Prescriptions:  .  albuterol (PROVENTIL) (2.5 MG/3ML) 0.083% nebulizer solution, Take 3 mLs (2.5 mg total) by nebulization every 6 (six) hours as needed for wheezing or shortness of breath., Disp: 150 mL, Rfl: 1 .  ALPRAZolam (XANAX) 0.25 MG tablet, Take 1 tablet (0.25 mg total) by mouth 3 (three) times daily as needed for anxiety., Disp: 60 tablet, Rfl: 5 .  azithromycin (ZITHROMAX) 250 MG tablet, Take by mouth., Disp: , Rfl:  .  glucose blood test strip, Check blood sugar daily. Dx Type 2 diabetes E11.9, Disp: 100 each, Rfl: 3 .  meclizine (ANTIVERT) 25 MG tablet, TAKE ONE TABLET BY MOUTH THREE TIMES DAILY AS NEEDED FOR DIZZINESS, Disp: 30 tablet, Rfl: 5 .  meloxicam (MOBIC) 15 MG tablet, Take 1 tablet (15 mg total) by mouth daily as needed for pain., Disp: 90 tablet, Rfl: 2 .  metFORMIN (GLUCOPHAGE XR) 500 MG 24 hr tablet, Take 2 tablets (1,000 mg total) by mouth 2 (two) times daily. With meals, Disp: 360 tablet, Rfl: 4 .  mirtazapine (REMERON) 15 MG tablet, Take 1 tablet (15 mg total) by mouth at bedtime., Disp: 30 tablet, Rfl: 0 .  montelukast (SINGULAIR) 10 MG tablet, Take 10 mg by mouth at bedtime., Disp: , Rfl:  .  Nutritional Supplements (GLUCERNA ADVANCE SHAKE) LIQD, Drink 1-2 shakes per day, Disp: 24 Bottle, Rfl: 12 .  oxyCODONE-acetaminophen (PERCOCET) 10-325 MG tablet, Take 1 tablet by mouth every 4 (four) hours as needed (for pain.)., Disp: 180 tablet, Rfl:  0 .  umeclidinium-vilanterol (ANORO ELLIPTA) 62.5-25 MCG/INH AEPB, Inhale 1 puff into the lungs daily., Disp: 60 each, Rfl: 4 .  aspirin EC 81 MG tablet, Take 81 mg by mouth daily. , Disp: , Rfl:  .  ferrous sulfate 325 (65 FE) MG tablet, Take 1 tablet (325 mg total) by mouth 2 (two) times daily with a meal. (Patient not taking: Reported on 05/13/2016), Disp: 30 tablet, Rfl: 3 .  ketoconazole (NIZORAL) 2 % cream, Apply 1 application topically  daily. (Patient not taking: Reported on 05/13/2016), Disp: 60 g, Rfl: 0  Review of Systems  Constitutional: Positive for fatigue and malaise/fatigue. Negative for activity change, appetite change, chills, diaphoresis, fever and unexpected weight change.  HENT: Positive for congestion. Negative for ear discharge, ear pain, hearing loss, nosebleeds, postnasal drip, rhinorrhea, sinus pain, sinus pressure, sneezing, sore throat, tinnitus and trouble swallowing.   Eyes: Positive for discharge and itching. Negative for photophobia, pain, redness and visual disturbance.  Respiratory: Positive for cough, chest tightness, shortness of breath and wheezing. Negative for apnea, hemoptysis, choking and stridor.   Cardiovascular: Positive for leg swelling. Negative for chest pain and palpitations.  Gastrointestinal: Negative.   Neurological: Negative for dizziness, light-headedness and headaches.    Social History  Substance Use Topics  . Smoking status: Former Smoker    Packs/day: 0.25    Years: 45.00    Types: Cigarettes    Quit date: 03/17/2016  . Smokeless tobacco: Never Used     Comment: previously smoked 2 1/2 ppd for 30 years.   . Alcohol use No   Objective:   BP 136/76 (BP Location: Left Arm, Patient Position: Sitting, Cuff Size: Large)   Pulse 92   Temp 98.2 F (36.8 C) (Oral)   Resp 16   Wt 171 lb (77.6 kg)   BMI 35.74 kg/m     Physical Exam   General Appearance:    Alert, cooperative, no distress  Eyes:    PERRL, conjunctiva/corneas clear, EOM's intact       Lungs:     Clear to auscultation bilaterally, respirations unlabored  Heart:    Regular rate and rhythm  Neurologic:   Awake, alert, oriented x 3. No apparent focal neurological           defect.           Assessment & Plan:     1. Pneumonia of left lung due to infectious organism, unspecified part of lung  - DG Chest 2 View; Future       Lelon Huh, MD  [facility]

## 2016-05-14 ENCOUNTER — Ambulatory Visit: Payer: Medicare Other | Admitting: Family Medicine

## 2016-05-14 ENCOUNTER — Telehealth: Payer: Self-pay | Admitting: Family Medicine

## 2016-05-14 DIAGNOSIS — R918 Other nonspecific abnormal finding of lung field: Secondary | ICD-10-CM | POA: Diagnosis not present

## 2016-05-14 DIAGNOSIS — I10 Essential (primary) hypertension: Secondary | ICD-10-CM | POA: Diagnosis not present

## 2016-05-14 DIAGNOSIS — E119 Type 2 diabetes mellitus without complications: Secondary | ICD-10-CM | POA: Diagnosis not present

## 2016-05-14 DIAGNOSIS — J441 Chronic obstructive pulmonary disease with (acute) exacerbation: Secondary | ICD-10-CM | POA: Diagnosis not present

## 2016-05-14 DIAGNOSIS — A0472 Enterocolitis due to Clostridium difficile, not specified as recurrent: Secondary | ICD-10-CM | POA: Diagnosis not present

## 2016-05-14 DIAGNOSIS — Z510 Encounter for antineoplastic radiation therapy: Secondary | ICD-10-CM | POA: Diagnosis not present

## 2016-05-14 DIAGNOSIS — C3411 Malignant neoplasm of upper lobe, right bronchus or lung: Secondary | ICD-10-CM | POA: Diagnosis not present

## 2016-05-14 DIAGNOSIS — Z87891 Personal history of nicotine dependence: Secondary | ICD-10-CM | POA: Diagnosis not present

## 2016-05-14 NOTE — Telephone Encounter (Signed)
Did she need a medication change?

## 2016-05-14 NOTE — Progress Notes (Deleted)
[place]  [phone] [phone]  ID: Yvette Riley OB: [birth year]  MR#: [id number]  VOH#:[id number]  Patient Care Team: Birdie Sons, MD as PCP - General (Family Medicine) Dionisio David, MD (Cardiology)  CHIEF COMPLAINT: Small cell lung cancer of the upper lobe of the right lung.  INTERVAL HISTORY: Patient is a 61 year old female who recently underwent CT screening for lung cancer and found to have a suspicious lesion in her right upper lobe. She is highly anxious, but otherwise feels well. She has a chronic cough that is unchanged. She has no neurologic complaints. She denies any recent fevers or illnesses. She has a good appetite and denies weight loss. She has no chest pain, shortness of breath, or hemoptysis. She denies any nausea, vomiting, constipation, or diarrhea. She has no urinary complaints. Patient otherwise feels well and offers no further specific complaints.  REVIEW OF SYSTEMS:   Review of Systems  Constitutional: Negative.  Negative for fever, malaise/fatigue and weight loss.  Respiratory: Positive for cough. Negative for hemoptysis and shortness of breath.   Cardiovascular: Negative.  Negative for chest pain and leg swelling.  Gastrointestinal: Negative.  Negative for abdominal pain.  Genitourinary: Negative.   Musculoskeletal: Negative.   Neurological: Negative.  Negative for weakness.  Psychiatric/Behavioral: The patient is nervous/anxious.     As per HPI. Otherwise, a complete review of systems is negative.  PAST MEDICAL HISTORY: Past Medical History:  Diagnosis Date  . Anemia   . Asthma   . CAP (community acquired pneumonia) 03/21/2015  . COPD (chronic obstructive pulmonary disease) ([place])   . Diabetes mellitus without complication ([place])   . Displacement of lumbar intervertebral disc without myelopathy   . Emphysema of lung ([place])   . History of chicken pox   . Shortness of breath     PAST SURGICAL  HISTORY: Past Surgical History:  Procedure Laterality Date  . CESAREAN SECTION    . TUBAL LIGATION      FAMILY HISTORY: Family History  Problem Relation Age of Onset  . Heart failure Mother   . Heart disease Mother   . Stroke Mother   . Heart disease Brother   . COPD Brother   . Cancer Maternal Aunt     Breast Cancer    ADVANCED DIRECTIVES (Y/N):  N  HEALTH MAINTENANCE: Social History  Substance Use Topics  . Smoking status: Former Smoker    Packs/day: 0.25    Years: 45.00    Types: Cigarettes    Quit date: 03/17/2016  . Smokeless tobacco: Never Used     Comment: previously smoked 2 1/2 ppd for 30 years.   . Alcohol use No     Colonoscopy:  PAP:  Bone density:  Lipid panel:  Allergies  Allergen Reactions  . Codeine Anaphylaxis  . Crestor [Rosuvastatin] Anaphylaxis  . Other Anaphylaxis  . Penicillins Anaphylaxis, Rash and Other (See Comments)    Reaction:  Tongue swelling  Has patient had a PCN reaction causing immediate rash, facial/tongue/throat swelling, SOB or lightheadedness with hypotension:  Yes   Has patient had a PCN reaction causing severe rash involving mucus membranes or skin necrosis: No Has patient had a PCN reaction that required hospitalization No Has patient had a PCN reaction occurring within the last 10 years: No If all of the above answers are "NO", then may proceed with Cephalosporin use.  . Yellow Jacket Venom [Bee Venom] Anaphylaxis  . Gemfibrozil Rash, Nausea And Vomiting and Swelling  .  Statins Nausea And Vomiting and Swelling  . Trazodone And Nefazodone Nausea And Vomiting  . Lipitor [Atorvastatin] Rash    Current Outpatient Prescriptions  Medication Sig Dispense Refill  . albuterol (PROVENTIL) (2.5 MG/3ML) 0.083% nebulizer solution Take 3 mLs (2.5 mg total) by nebulization every 6 (six) hours as needed for wheezing or shortness of breath. 150 mL 1  . ALPRAZolam (XANAX) 0.25 MG tablet Take 1 tablet (0.25 mg total) by mouth 3 (three)  times daily as needed for anxiety. 60 tablet 5  . aspirin EC 81 MG tablet Take 81 mg by mouth daily.     [name] azithromycin (ZITHROMAX) 250 MG tablet Take by mouth.    . ferrous sulfate 325 (65 FE) MG tablet Take 1 tablet (325 mg total) by mouth 2 (two) times daily with a meal. (Patient not taking: Reported on 05/13/2016) 30 tablet 3  . glucose blood test strip Check blood sugar daily. Dx Type 2 diabetes E11.9 100 each 3  . ketoconazole (NIZORAL) 2 % cream Apply 1 application topically daily. (Patient not taking: Reported on 05/13/2016) 60 g 0  . meclizine (ANTIVERT) 25 MG tablet TAKE ONE TABLET BY MOUTH THREE TIMES DAILY AS NEEDED FOR DIZZINESS 30 tablet 5  . meloxicam (MOBIC) 15 MG tablet Take 1 tablet (15 mg total) by mouth daily as needed for pain. 90 tablet 2  . metFORMIN (GLUCOPHAGE XR) 500 MG 24 hr tablet Take 2 tablets (1,000 mg total) by mouth 2 (two) times daily. With meals 360 tablet 4  . mirtazapine (REMERON) 15 MG tablet Take 1 tablet (15 mg total) by mouth at bedtime. 30 tablet 0  . montelukast (SINGULAIR) 10 MG tablet Take 10 mg by mouth at bedtime.    . Nutritional Supplements (GLUCERNA ADVANCE SHAKE) LIQD Drink 1-2 shakes per day 24 Bottle 12  . oxyCODONE-acetaminophen (PERCOCET) 10-325 MG tablet Take 1 tablet by mouth every 4 (four) hours as needed (for pain.). 180 tablet 0  . umeclidinium-vilanterol (ANORO ELLIPTA) 62.5-25 MCG/INH AEPB Inhale 1 puff into the lungs daily. 60 each 4   No current facility-administered medications for this visit.     OBJECTIVE: There were no vitals filed for this visit.   There is no height or weight on file to calculate BMI.    ECOG FS:0 - Asymptomatic  General: Well-developed, well-nourished, no acute distress. Eyes: Pink conjunctiva, anicteric sclera. HEENT: Normocephalic, moist mucous membranes, clear oropharnyx. Lungs: Clear to auscultation bilaterally. Heart: Regular rate and rhythm. No rubs, murmurs, or gallops. Abdomen: Soft, nontender,  nondistended. No organomegaly noted, normoactive bowel sounds. Musculoskeletal: No edema, cyanosis, or clubbing. Neuro: Alert, answering all questions appropriately. Cranial nerves grossly intact. Skin: No rashes or petechiae noted. Psych: Normal affect. Lymphatics: No cervical, calvicular, axillary or inguinal LAD.   LAB RESULTS:  Lab Results  Component Value Date   NA 123 (L) 03/20/2016   K 4.2 03/20/2016   CL 86 (L) 03/20/2016   CO2 24 03/20/2016   GLUCOSE 159 (H) 03/20/2016   BUN 15 03/20/2016   CREATININE 0.67 03/20/2016   CALCIUM 9.1 03/20/2016   PROT 7.2 03/17/2016   ALBUMIN 4.1 03/17/2016   AST 21 03/17/2016   ALT 11 (L) 03/17/2016   ALKPHOS 69 03/17/2016   BILITOT 1.5 (H) 03/17/2016   GFRNONAA >60 03/20/2016   GFRAA >60 03/20/2016    Lab Results  Component Value Date   WBC 8.3 03/20/2016   NEUTROABS 7.5 (H) 03/17/2016   HGB 9.5 (L) 03/20/2016   HCT 26.9 (  L) 03/20/2016   MCV 117.5 (H) 03/20/2016   PLT 370 03/20/2016     STUDIES: Dg Chest 2 View  Result Date: 05/13/2016 CLINICAL DATA:  Cough.  History of right lung cancer. EXAM: CHEST  2 VIEW COMPARISON:  Radiographs of March 17, 2016. PET scan of March 08, 2016. FINDINGS: Stable cardiac size. No pneumothorax or pleural effusion is noted. Left lung is clear. Right hilar prominence is noted consistent with history of right lung cancer. Bony thorax is unremarkable. IMPRESSION: Right hilar prominence is again noted consistent with history of lung cancer. No other significant abnormality seen at this time. Electronically Signed   By: Marijo Conception, M.D.   On: 05/13/2016 14:20    ASSESSMENT: Small cell lung cancer of the upper lobe of the right lung  PLAN:    1. Small cell lung cancer of the upper lobe of the right lung: Screening CT scan reviewed independently and reported as above, highly suspicious for underlying malignancy. Patient has a referral to pulmonology for consideration of bronchoscopy and  biopsy. Have also ordered a PET scan for additional staging purposes. Patient will return to clinic one week after her biopsy to discuss the results and treatment planning.  Approximately 45 minutes was spent in discussion of which greater than 50% was consultation.  Patient expressed understanding and was in agreement with this plan. She also understands that She can call clinic at any time with any questions, concerns, or complaints.   Cancer Staging No matching staging information was found for the patient.  Lloyd Huger, MD   05/14/2016 10:58 PM

## 2016-05-14 NOTE — Telephone Encounter (Signed)
Pt called saying when she was in yesterday she forgot to mention that the pain medication she is taking is not helping the pain in her back and right chest area.    Her call back is [phone]  Thanks teri

## 2016-05-15 ENCOUNTER — Inpatient Hospital Stay: Payer: Medicare Other

## 2016-05-15 ENCOUNTER — Telehealth: Payer: Self-pay | Admitting: *Deleted

## 2016-05-15 ENCOUNTER — Inpatient Hospital Stay: Payer: Medicare Other | Admitting: Oncology

## 2016-05-15 DIAGNOSIS — C3490 Malignant neoplasm of unspecified part of unspecified bronchus or lung: Secondary | ICD-10-CM | POA: Diagnosis not present

## 2016-05-15 DIAGNOSIS — Z510 Encounter for antineoplastic radiation therapy: Secondary | ICD-10-CM | POA: Diagnosis not present

## 2016-05-15 DIAGNOSIS — E781 Pure hyperglyceridemia: Secondary | ICD-10-CM | POA: Diagnosis not present

## 2016-05-15 DIAGNOSIS — F17201 Nicotine dependence, unspecified, in remission: Secondary | ICD-10-CM | POA: Diagnosis not present

## 2016-05-15 DIAGNOSIS — Z87891 Personal history of nicotine dependence: Secondary | ICD-10-CM | POA: Diagnosis not present

## 2016-05-15 DIAGNOSIS — C3411 Malignant neoplasm of upper lobe, right bronchus or lung: Secondary | ICD-10-CM | POA: Diagnosis not present

## 2016-05-15 DIAGNOSIS — C3491 Malignant neoplasm of unspecified part of right bronchus or lung: Secondary | ICD-10-CM | POA: Diagnosis not present

## 2016-05-15 NOTE — Telephone Encounter (Signed)
-----   Message from Wallene Dales sent at 05/15/2016  9:46 AM EDT ----- Regarding: Self Dismissal from clinic I called the pt to see if she was coming in for her lab\md\tx appt today and her husband relayed that the patient was choosing to continue her care in [place]. I didn't know if anyone wanted to contact the patient to follow-up with her request.  Thank you

## 2016-05-16 ENCOUNTER — Inpatient Hospital Stay: Payer: Medicare Other

## 2016-05-16 DIAGNOSIS — Z87891 Personal history of nicotine dependence: Secondary | ICD-10-CM | POA: Diagnosis not present

## 2016-05-16 DIAGNOSIS — C3490 Malignant neoplasm of unspecified part of unspecified bronchus or lung: Secondary | ICD-10-CM | POA: Diagnosis not present

## 2016-05-16 DIAGNOSIS — Z510 Encounter for antineoplastic radiation therapy: Secondary | ICD-10-CM | POA: Diagnosis not present

## 2016-05-16 DIAGNOSIS — C3411 Malignant neoplasm of upper lobe, right bronchus or lung: Secondary | ICD-10-CM | POA: Diagnosis not present

## 2016-05-16 NOTE — Telephone Encounter (Signed)
She did not say which medication she was referring to.  She just said it was not helping with the pain.  I am assuming this was a recent medication she was given for pain.

## 2016-05-16 NOTE — Telephone Encounter (Signed)
LMTCB ED 

## 2016-05-17 ENCOUNTER — Inpatient Hospital Stay: Payer: Medicare Other

## 2016-05-17 DIAGNOSIS — Z87891 Personal history of nicotine dependence: Secondary | ICD-10-CM | POA: Diagnosis not present

## 2016-05-17 DIAGNOSIS — C3411 Malignant neoplasm of upper lobe, right bronchus or lung: Secondary | ICD-10-CM | POA: Diagnosis not present

## 2016-05-17 DIAGNOSIS — Z510 Encounter for antineoplastic radiation therapy: Secondary | ICD-10-CM | POA: Diagnosis not present

## 2016-05-17 DIAGNOSIS — C3490 Malignant neoplasm of unspecified part of unspecified bronchus or lung: Secondary | ICD-10-CM | POA: Diagnosis not present

## 2016-05-19 DIAGNOSIS — R918 Other nonspecific abnormal finding of lung field: Secondary | ICD-10-CM | POA: Diagnosis not present

## 2016-05-19 DIAGNOSIS — A0472 Enterocolitis due to Clostridium difficile, not specified as recurrent: Secondary | ICD-10-CM | POA: Diagnosis not present

## 2016-05-19 DIAGNOSIS — E119 Type 2 diabetes mellitus without complications: Secondary | ICD-10-CM | POA: Diagnosis not present

## 2016-05-19 DIAGNOSIS — I10 Essential (primary) hypertension: Secondary | ICD-10-CM | POA: Diagnosis not present

## 2016-05-19 DIAGNOSIS — Z87891 Personal history of nicotine dependence: Secondary | ICD-10-CM | POA: Diagnosis not present

## 2016-05-19 DIAGNOSIS — J441 Chronic obstructive pulmonary disease with (acute) exacerbation: Secondary | ICD-10-CM | POA: Diagnosis not present

## 2016-05-20 DIAGNOSIS — Z87891 Personal history of nicotine dependence: Secondary | ICD-10-CM | POA: Diagnosis not present

## 2016-05-20 DIAGNOSIS — C3411 Malignant neoplasm of upper lobe, right bronchus or lung: Secondary | ICD-10-CM | POA: Diagnosis not present

## 2016-05-20 DIAGNOSIS — Z510 Encounter for antineoplastic radiation therapy: Secondary | ICD-10-CM | POA: Diagnosis not present

## 2016-05-21 DIAGNOSIS — Z510 Encounter for antineoplastic radiation therapy: Secondary | ICD-10-CM | POA: Diagnosis not present

## 2016-05-21 DIAGNOSIS — Z87891 Personal history of nicotine dependence: Secondary | ICD-10-CM | POA: Diagnosis not present

## 2016-05-21 DIAGNOSIS — C3411 Malignant neoplasm of upper lobe, right bronchus or lung: Secondary | ICD-10-CM | POA: Diagnosis not present

## 2016-05-21 NOTE — Telephone Encounter (Signed)
LMTCB-stated in my message that we had been trying to return her call regarding her concerns about her medications.  Asked her to returne the call if she still needed to talk to [place].  If she does not return call in the next 24 hours will close this note out.

## 2016-05-21 NOTE — Telephone Encounter (Signed)
FYI  Patient called back about her medication.  She said her Radiologist/Oncologist said she needed a long acting pain medication.  When asked about the pain and how well the medication was working she stated she takes her Oxycodone every 4-6 hours.  She said usually it is every 5 hours and she tried to stretch it at times to every six hours.  She states she never takes it sooner than every 4 hours.  When asked why the other doctor wanted to change it she said because she is going through radiation.  She said she was ok with the way her medicine was now without making a change and ended the conversation with saying not to change anything right now.  She said she has an apopintment with you in May and if something changes she will bring it up at that visit but, for now she wanted to keep things the way they are.

## 2016-05-22 DIAGNOSIS — Z510 Encounter for antineoplastic radiation therapy: Secondary | ICD-10-CM | POA: Diagnosis not present

## 2016-05-22 DIAGNOSIS — Z87891 Personal history of nicotine dependence: Secondary | ICD-10-CM | POA: Diagnosis not present

## 2016-05-22 DIAGNOSIS — C3411 Malignant neoplasm of upper lobe, right bronchus or lung: Secondary | ICD-10-CM | POA: Diagnosis not present

## 2016-05-23 DIAGNOSIS — C3411 Malignant neoplasm of upper lobe, right bronchus or lung: Secondary | ICD-10-CM | POA: Diagnosis not present

## 2016-05-23 DIAGNOSIS — Z510 Encounter for antineoplastic radiation therapy: Secondary | ICD-10-CM | POA: Diagnosis not present

## 2016-05-23 DIAGNOSIS — Z87891 Personal history of nicotine dependence: Secondary | ICD-10-CM | POA: Diagnosis not present

## 2016-05-24 DIAGNOSIS — Z87891 Personal history of nicotine dependence: Secondary | ICD-10-CM | POA: Diagnosis not present

## 2016-05-24 DIAGNOSIS — C3411 Malignant neoplasm of upper lobe, right bronchus or lung: Secondary | ICD-10-CM | POA: Diagnosis not present

## 2016-05-24 DIAGNOSIS — Z510 Encounter for antineoplastic radiation therapy: Secondary | ICD-10-CM | POA: Diagnosis not present

## 2016-05-26 DIAGNOSIS — I10 Essential (primary) hypertension: Secondary | ICD-10-CM | POA: Diagnosis not present

## 2016-05-26 DIAGNOSIS — R918 Other nonspecific abnormal finding of lung field: Secondary | ICD-10-CM | POA: Diagnosis not present

## 2016-05-26 DIAGNOSIS — A0472 Enterocolitis due to Clostridium difficile, not specified as recurrent: Secondary | ICD-10-CM | POA: Diagnosis not present

## 2016-05-26 DIAGNOSIS — Z87891 Personal history of nicotine dependence: Secondary | ICD-10-CM | POA: Diagnosis not present

## 2016-05-26 DIAGNOSIS — J441 Chronic obstructive pulmonary disease with (acute) exacerbation: Secondary | ICD-10-CM | POA: Diagnosis not present

## 2016-05-26 DIAGNOSIS — E119 Type 2 diabetes mellitus without complications: Secondary | ICD-10-CM | POA: Diagnosis not present

## 2016-05-27 DIAGNOSIS — C3411 Malignant neoplasm of upper lobe, right bronchus or lung: Secondary | ICD-10-CM | POA: Diagnosis not present

## 2016-05-27 DIAGNOSIS — Z87891 Personal history of nicotine dependence: Secondary | ICD-10-CM | POA: Diagnosis not present

## 2016-05-27 DIAGNOSIS — Z510 Encounter for antineoplastic radiation therapy: Secondary | ICD-10-CM | POA: Diagnosis not present

## 2016-05-28 DIAGNOSIS — Z510 Encounter for antineoplastic radiation therapy: Secondary | ICD-10-CM | POA: Diagnosis not present

## 2016-05-28 DIAGNOSIS — C3490 Malignant neoplasm of unspecified part of unspecified bronchus or lung: Secondary | ICD-10-CM | POA: Diagnosis not present

## 2016-05-28 DIAGNOSIS — Z87891 Personal history of nicotine dependence: Secondary | ICD-10-CM | POA: Diagnosis not present

## 2016-05-28 DIAGNOSIS — C3411 Malignant neoplasm of upper lobe, right bronchus or lung: Secondary | ICD-10-CM | POA: Diagnosis not present

## 2016-05-29 ENCOUNTER — Ambulatory Visit (INDEPENDENT_AMBULATORY_CARE_PROVIDER_SITE_OTHER): Payer: Medicare Other | Admitting: Family Medicine

## 2016-05-29 ENCOUNTER — Encounter: Payer: Self-pay | Admitting: Family Medicine

## 2016-05-29 VITALS — BP 100/70 | HR 104 | Temp 98.2°F | Resp 16 | Ht <= 58 in | Wt 169.0 lb

## 2016-05-29 DIAGNOSIS — M545 Low back pain: Secondary | ICD-10-CM

## 2016-05-29 DIAGNOSIS — C3491 Malignant neoplasm of unspecified part of right bronchus or lung: Secondary | ICD-10-CM

## 2016-05-29 DIAGNOSIS — Z87891 Personal history of nicotine dependence: Secondary | ICD-10-CM | POA: Diagnosis not present

## 2016-05-29 DIAGNOSIS — G4700 Insomnia, unspecified: Secondary | ICD-10-CM | POA: Diagnosis not present

## 2016-05-29 DIAGNOSIS — E119 Type 2 diabetes mellitus without complications: Secondary | ICD-10-CM

## 2016-05-29 DIAGNOSIS — R630 Anorexia: Secondary | ICD-10-CM | POA: Diagnosis not present

## 2016-05-29 DIAGNOSIS — G8929 Other chronic pain: Secondary | ICD-10-CM

## 2016-05-29 DIAGNOSIS — Z720 Tobacco use: Secondary | ICD-10-CM | POA: Diagnosis not present

## 2016-05-29 DIAGNOSIS — Z510 Encounter for antineoplastic radiation therapy: Secondary | ICD-10-CM | POA: Diagnosis not present

## 2016-05-29 DIAGNOSIS — C3490 Malignant neoplasm of unspecified part of unspecified bronchus or lung: Secondary | ICD-10-CM | POA: Diagnosis not present

## 2016-05-29 DIAGNOSIS — C3411 Malignant neoplasm of upper lobe, right bronchus or lung: Secondary | ICD-10-CM | POA: Diagnosis not present

## 2016-05-29 MED ORDER — OXYCODONE-ACETAMINOPHEN 10-325 MG PO TABS: 1.0000 | ORAL_TABLET | ORAL | 0 refills | Status: DC | PRN

## 2016-05-29 MED ORDER — MIRTAZAPINE 15 MG PO TABS: 15.0000 mg | ORAL_TABLET | Freq: Every day | ORAL | 3 refills | Status: DC

## 2016-05-29 MED ORDER — METFORMIN HCL ER 500 MG PO TB24: 1000.0000 mg | ORAL_TABLET | Freq: Every day | ORAL | 1 refills | Status: DC

## 2016-05-29 NOTE — Progress Notes (Signed)
Patient: Yvette Riley Female    DOB: 09/13/1955   61 y.o.   MRN: [id number] Visit Date: 05/29/2016  Today's Provider: Lelon Huh, MD   Chief Complaint  Patient presents with  . Follow-up   Subjective:    HPI  Loss of appetite From 05/01/2016-saw [facility] Started on mrtazapine as below. Advised patient and son to hold Ambien.  Also given Glucerna shakes for nutrition. She feels appetite has improved. Is still going through chemo but completed radiation therapy. Mood has been pretty good. Continues on oxycodone/apap, states she usually takes 5, but sometimes 6 in a day. Is still on '1000mg'$  BID metformin but last a1c in January was 6.0.   Wt Readings from Last 3 Encounters:  05/29/16 169 lb (76.7 kg)  05/13/16 171 lb (77.6 kg)  05/01/16 164 lb 3.2 oz (74.5 kg)     Allergies  Allergen Reactions  . Codeine Anaphylaxis  . Crestor [Rosuvastatin] Anaphylaxis  . Other Anaphylaxis  . Penicillins Anaphylaxis, Rash and Other (See Comments)    Reaction:  Tongue swelling  Has patient had a PCN reaction causing immediate rash, facial/tongue/throat swelling, SOB or lightheadedness with hypotension:  Yes   Has patient had a PCN reaction causing severe rash involving mucus membranes or skin necrosis: No Has patient had a PCN reaction that required hospitalization No Has patient had a PCN reaction occurring within the last 10 years: No If all of the above answers are "NO", then may proceed with Cephalosporin use.  . Yellow Jacket Venom [Bee Venom] Anaphylaxis  . Gemfibrozil Rash, Nausea And Vomiting and Swelling  . Statins Nausea And Vomiting and Swelling  . Trazodone And Nefazodone Nausea And Vomiting  . Lipitor [Atorvastatin] Rash     Current Outpatient Prescriptions:  .  albuterol (PROVENTIL) (2.5 MG/3ML) 0.083% nebulizer solution, Take 3 mLs (2.5 mg total) by nebulization every 6 (six) hours as needed for wheezing or shortness of breath., Disp: 150 mL, Rfl: 1 .   ALPRAZolam (XANAX) 0.25 MG tablet, Take 1 tablet (0.25 mg total) by mouth 3 (three) times daily as needed for anxiety., Disp: 60 tablet, Rfl: 5 .  aspirin EC 81 MG tablet, Take 81 mg by mouth daily. , Disp: , Rfl:  .  ferrous sulfate 325 (65 FE) MG tablet, Take 1 tablet (325 mg total) by mouth 2 (two) times daily with a meal., Disp: 30 tablet, Rfl: 3 .  glucose blood test strip, Check blood sugar daily. Dx Type 2 diabetes E11.9, Disp: 100 each, Rfl: 3 .  ketoconazole (NIZORAL) 2 % cream, Apply 1 application topically daily., Disp: 60 g, Rfl: 0 .  meclizine (ANTIVERT) 25 MG tablet, TAKE ONE TABLET BY MOUTH THREE TIMES DAILY AS NEEDED FOR DIZZINESS, Disp: 30 tablet, Rfl: 5 .  meloxicam (MOBIC) 15 MG tablet, Take 1 tablet (15 mg total) by mouth daily as needed for pain., Disp: 90 tablet, Rfl: 2 .  metFORMIN (GLUCOPHAGE XR) 500 MG 24 hr tablet, Take 2 tablets (1,000 mg total) by mouth 2 (two) times daily. With meals, Disp: 360 tablet, Rfl: 4 .  mirtazapine (REMERON) 15 MG tablet, Take 1 tablet (15 mg total) by mouth at bedtime., Disp: 30 tablet, Rfl: 0 .  montelukast (SINGULAIR) 10 MG tablet, Take 10 mg by mouth at bedtime., Disp: , Rfl:  .  Nutritional Supplements (GLUCERNA ADVANCE SHAKE) LIQD, Drink 1-2 shakes per day, Disp: 24 Bottle, Rfl: 12 .  oxyCODONE-acetaminophen (PERCOCET) 10-325 MG tablet, Take 1  tablet by mouth every 4 (four) hours as needed (for pain.)., Disp: 180 tablet, Rfl: 0 .  prochlorperazine (COMPAZINE) 10 MG tablet, Take 1 tablet by mouth every 6 (six) hours as needed., Disp: , Rfl:  .  umeclidinium-vilanterol (ANORO ELLIPTA) 62.5-25 MCG/INH AEPB, Inhale 1 puff into the lungs daily., Disp: 60 each, Rfl: 4  Review of Systems  Constitutional: Positive for appetite change. Negative for chills, fatigue and fever.  Respiratory: Negative for chest tightness and shortness of breath.   Cardiovascular: Negative for chest pain and palpitations.  Gastrointestinal: Negative for abdominal  pain, nausea and vomiting.  Neurological: Negative for dizziness and weakness.    Social History  Substance Use Topics  . Smoking status: Former Smoker    Packs/day: 0.25    Years: 45.00    Types: Cigarettes    Quit date: 03/17/2016  . Smokeless tobacco: Never Used     Comment: previously smoked 2 1/2 ppd for 30 years.   . Alcohol use No   Objective:   BP 100/70 (BP Location: Right Arm, Patient Position: Sitting, Cuff Size: Large)   Pulse (!) 104   Temp 98.2 F (36.8 C) (Oral)   Resp 16   Ht '4\' 10"'$  (1.473 m)   Wt 169 lb (76.7 kg)   SpO2 97%   BMI 35.32 kg/m   Depression screen [facility] 2/9 05/29/2016 12/13/2014  Decreased Interest 1 0  Down, Depressed, Hopeless 0 0  PHQ - 2 Score 1 0  Altered sleeping 1 -  Tired, decreased energy 2 -  Change in appetite 1 -  Feeling bad or failure about yourself  1 -  Trouble concentrating 1 -  Moving slowly or fidgety/restless 0 -  Suicidal thoughts 0 -  PHQ-9 Score 7 -  Difficult doing work/chores Somewhat difficult -     Physical Exam   General Appearance:    Alert, cooperative, no distress  Eyes:    PERRL, conjunctiva/corneas clear, EOM's intact       Lungs:     Clear to auscultation bilaterally, respirations unlabored  Heart:    Regular rate and rhythm  Neurologic:   Awake, alert, oriented x 3. No apparent focal neurological           defect.           Assessment & Plan:     1. Insomnia, unspecified type Stable since stopping ambien and starting mirtazapine, but advised to call for refill Ambien if sleeping worsens.   2. Loss of appetite Improved on mirtazapine, continue for now but consider discontinuing if stable at follow up - mirtazapine (REMERON) 15 MG tablet; Take 1 tablet (15 mg total) by mouth at bedtime.  Dispense: 30 tablet; Refill: 3  3. Tobacco abuse   4. Chronic midline low back pain, with sciatica presence unspecified Doing well on current pain medication regiment.  - oxyCODONE-acetaminophen (PERCOCET)  10-325 MG tablet; Take 1 tablet by mouth every 4 (four) hours as needed (for pain.).  Dispense: 180 tablet; Refill: 0  5. Type 2 diabetes mellitus without complication, without long-term current use of insulin (HCC) Decrease metformin to '1000mg'$  every morning.   6. Small cell lung cancer, right ([place]) Follow up oncology as scheduled.      Return in about 2 months (around 07/29/2016) for diabetes.   Lelon Huh, MD  [facility]

## 2016-05-30 DIAGNOSIS — C3411 Malignant neoplasm of upper lobe, right bronchus or lung: Secondary | ICD-10-CM | POA: Diagnosis not present

## 2016-05-30 DIAGNOSIS — Z87891 Personal history of nicotine dependence: Secondary | ICD-10-CM | POA: Diagnosis not present

## 2016-05-30 DIAGNOSIS — C3490 Malignant neoplasm of unspecified part of unspecified bronchus or lung: Secondary | ICD-10-CM | POA: Diagnosis not present

## 2016-05-30 DIAGNOSIS — Z510 Encounter for antineoplastic radiation therapy: Secondary | ICD-10-CM | POA: Diagnosis not present

## 2016-05-31 DIAGNOSIS — Z87891 Personal history of nicotine dependence: Secondary | ICD-10-CM | POA: Diagnosis not present

## 2016-05-31 DIAGNOSIS — C3411 Malignant neoplasm of upper lobe, right bronchus or lung: Secondary | ICD-10-CM | POA: Diagnosis not present

## 2016-05-31 DIAGNOSIS — C3490 Malignant neoplasm of unspecified part of unspecified bronchus or lung: Secondary | ICD-10-CM | POA: Diagnosis not present

## 2016-05-31 DIAGNOSIS — Z510 Encounter for antineoplastic radiation therapy: Secondary | ICD-10-CM | POA: Diagnosis not present

## 2016-06-03 ENCOUNTER — Ambulatory Visit: Payer: Medicare Other | Admitting: Internal Medicine

## 2016-06-03 DIAGNOSIS — C3411 Malignant neoplasm of upper lobe, right bronchus or lung: Secondary | ICD-10-CM | POA: Diagnosis not present

## 2016-06-03 DIAGNOSIS — Z510 Encounter for antineoplastic radiation therapy: Secondary | ICD-10-CM | POA: Diagnosis not present

## 2016-06-03 DIAGNOSIS — C3490 Malignant neoplasm of unspecified part of unspecified bronchus or lung: Secondary | ICD-10-CM | POA: Diagnosis not present

## 2016-06-03 DIAGNOSIS — Z87891 Personal history of nicotine dependence: Secondary | ICD-10-CM | POA: Diagnosis not present

## 2016-06-04 DIAGNOSIS — Z510 Encounter for antineoplastic radiation therapy: Secondary | ICD-10-CM | POA: Diagnosis not present

## 2016-06-04 DIAGNOSIS — C3490 Malignant neoplasm of unspecified part of unspecified bronchus or lung: Secondary | ICD-10-CM | POA: Diagnosis not present

## 2016-06-04 DIAGNOSIS — C3411 Malignant neoplasm of upper lobe, right bronchus or lung: Secondary | ICD-10-CM | POA: Diagnosis not present

## 2016-06-04 DIAGNOSIS — Z87891 Personal history of nicotine dependence: Secondary | ICD-10-CM | POA: Diagnosis not present

## 2016-06-05 DIAGNOSIS — Z87891 Personal history of nicotine dependence: Secondary | ICD-10-CM | POA: Diagnosis not present

## 2016-06-05 DIAGNOSIS — Z510 Encounter for antineoplastic radiation therapy: Secondary | ICD-10-CM | POA: Diagnosis not present

## 2016-06-05 DIAGNOSIS — C3411 Malignant neoplasm of upper lobe, right bronchus or lung: Secondary | ICD-10-CM | POA: Diagnosis not present

## 2016-06-05 DIAGNOSIS — C3491 Malignant neoplasm of unspecified part of right bronchus or lung: Secondary | ICD-10-CM | POA: Diagnosis not present

## 2016-06-05 DIAGNOSIS — E781 Pure hyperglyceridemia: Secondary | ICD-10-CM | POA: Diagnosis not present

## 2016-06-05 DIAGNOSIS — Z5111 Encounter for antineoplastic chemotherapy: Secondary | ICD-10-CM | POA: Diagnosis not present

## 2016-06-05 DIAGNOSIS — C3490 Malignant neoplasm of unspecified part of unspecified bronchus or lung: Secondary | ICD-10-CM | POA: Diagnosis not present

## 2016-06-05 DIAGNOSIS — F17201 Nicotine dependence, unspecified, in remission: Secondary | ICD-10-CM | POA: Diagnosis not present

## 2016-06-06 DIAGNOSIS — C3411 Malignant neoplasm of upper lobe, right bronchus or lung: Secondary | ICD-10-CM | POA: Diagnosis not present

## 2016-06-06 DIAGNOSIS — Z87891 Personal history of nicotine dependence: Secondary | ICD-10-CM | POA: Diagnosis not present

## 2016-06-06 DIAGNOSIS — C3490 Malignant neoplasm of unspecified part of unspecified bronchus or lung: Secondary | ICD-10-CM | POA: Diagnosis not present

## 2016-06-06 DIAGNOSIS — Z510 Encounter for antineoplastic radiation therapy: Secondary | ICD-10-CM | POA: Diagnosis not present

## 2016-06-06 DIAGNOSIS — Z5111 Encounter for antineoplastic chemotherapy: Secondary | ICD-10-CM | POA: Diagnosis not present

## 2016-06-07 DIAGNOSIS — C3411 Malignant neoplasm of upper lobe, right bronchus or lung: Secondary | ICD-10-CM | POA: Diagnosis not present

## 2016-06-07 DIAGNOSIS — C3490 Malignant neoplasm of unspecified part of unspecified bronchus or lung: Secondary | ICD-10-CM | POA: Diagnosis not present

## 2016-06-07 DIAGNOSIS — Z87891 Personal history of nicotine dependence: Secondary | ICD-10-CM | POA: Diagnosis not present

## 2016-06-07 DIAGNOSIS — Z5111 Encounter for antineoplastic chemotherapy: Secondary | ICD-10-CM | POA: Diagnosis not present

## 2016-06-07 DIAGNOSIS — Z510 Encounter for antineoplastic radiation therapy: Secondary | ICD-10-CM | POA: Diagnosis not present

## 2016-06-10 DIAGNOSIS — Z87891 Personal history of nicotine dependence: Secondary | ICD-10-CM | POA: Diagnosis not present

## 2016-06-10 DIAGNOSIS — C3411 Malignant neoplasm of upper lobe, right bronchus or lung: Secondary | ICD-10-CM | POA: Diagnosis not present

## 2016-06-10 DIAGNOSIS — Z510 Encounter for antineoplastic radiation therapy: Secondary | ICD-10-CM | POA: Diagnosis not present

## 2016-06-10 DIAGNOSIS — Z5111 Encounter for antineoplastic chemotherapy: Secondary | ICD-10-CM | POA: Diagnosis not present

## 2016-06-10 DIAGNOSIS — C3490 Malignant neoplasm of unspecified part of unspecified bronchus or lung: Secondary | ICD-10-CM | POA: Diagnosis not present

## 2016-06-11 DIAGNOSIS — Z510 Encounter for antineoplastic radiation therapy: Secondary | ICD-10-CM | POA: Diagnosis not present

## 2016-06-11 DIAGNOSIS — C3411 Malignant neoplasm of upper lobe, right bronchus or lung: Secondary | ICD-10-CM | POA: Diagnosis not present

## 2016-06-11 DIAGNOSIS — Z87891 Personal history of nicotine dependence: Secondary | ICD-10-CM | POA: Diagnosis not present

## 2016-06-11 DIAGNOSIS — C3490 Malignant neoplasm of unspecified part of unspecified bronchus or lung: Secondary | ICD-10-CM | POA: Diagnosis not present

## 2016-06-12 DIAGNOSIS — C3411 Malignant neoplasm of upper lobe, right bronchus or lung: Secondary | ICD-10-CM | POA: Diagnosis not present

## 2016-06-12 DIAGNOSIS — C3490 Malignant neoplasm of unspecified part of unspecified bronchus or lung: Secondary | ICD-10-CM | POA: Diagnosis not present

## 2016-06-12 DIAGNOSIS — Z510 Encounter for antineoplastic radiation therapy: Secondary | ICD-10-CM | POA: Diagnosis not present

## 2016-06-12 DIAGNOSIS — Z87891 Personal history of nicotine dependence: Secondary | ICD-10-CM | POA: Diagnosis not present

## 2016-06-13 DIAGNOSIS — Z87891 Personal history of nicotine dependence: Secondary | ICD-10-CM | POA: Diagnosis not present

## 2016-06-13 DIAGNOSIS — Z510 Encounter for antineoplastic radiation therapy: Secondary | ICD-10-CM | POA: Diagnosis not present

## 2016-06-13 DIAGNOSIS — C3411 Malignant neoplasm of upper lobe, right bronchus or lung: Secondary | ICD-10-CM | POA: Diagnosis not present

## 2016-06-13 DIAGNOSIS — C3490 Malignant neoplasm of unspecified part of unspecified bronchus or lung: Secondary | ICD-10-CM | POA: Diagnosis not present

## 2016-06-14 DIAGNOSIS — C3411 Malignant neoplasm of upper lobe, right bronchus or lung: Secondary | ICD-10-CM | POA: Diagnosis not present

## 2016-06-14 DIAGNOSIS — Z510 Encounter for antineoplastic radiation therapy: Secondary | ICD-10-CM | POA: Diagnosis not present

## 2016-06-14 DIAGNOSIS — C3490 Malignant neoplasm of unspecified part of unspecified bronchus or lung: Secondary | ICD-10-CM | POA: Diagnosis not present

## 2016-06-14 DIAGNOSIS — Z87891 Personal history of nicotine dependence: Secondary | ICD-10-CM | POA: Diagnosis not present

## 2016-06-17 DIAGNOSIS — C3411 Malignant neoplasm of upper lobe, right bronchus or lung: Secondary | ICD-10-CM | POA: Diagnosis not present

## 2016-06-17 DIAGNOSIS — C3490 Malignant neoplasm of unspecified part of unspecified bronchus or lung: Secondary | ICD-10-CM | POA: Diagnosis not present

## 2016-06-17 DIAGNOSIS — Z87891 Personal history of nicotine dependence: Secondary | ICD-10-CM | POA: Diagnosis not present

## 2016-06-17 DIAGNOSIS — R590 Localized enlarged lymph nodes: Secondary | ICD-10-CM | POA: Diagnosis not present

## 2016-06-17 DIAGNOSIS — R918 Other nonspecific abnormal finding of lung field: Secondary | ICD-10-CM | POA: Diagnosis not present

## 2016-06-17 DIAGNOSIS — R0609 Other forms of dyspnea: Secondary | ICD-10-CM | POA: Diagnosis not present

## 2016-06-17 DIAGNOSIS — J90 Pleural effusion, not elsewhere classified: Secondary | ICD-10-CM | POA: Diagnosis not present

## 2016-06-17 DIAGNOSIS — R0602 Shortness of breath: Secondary | ICD-10-CM | POA: Diagnosis not present

## 2016-06-17 DIAGNOSIS — Z510 Encounter for antineoplastic radiation therapy: Secondary | ICD-10-CM | POA: Diagnosis not present

## 2016-06-18 DIAGNOSIS — Z510 Encounter for antineoplastic radiation therapy: Secondary | ICD-10-CM | POA: Diagnosis not present

## 2016-06-18 DIAGNOSIS — C3490 Malignant neoplasm of unspecified part of unspecified bronchus or lung: Secondary | ICD-10-CM | POA: Diagnosis not present

## 2016-06-18 DIAGNOSIS — C3411 Malignant neoplasm of upper lobe, right bronchus or lung: Secondary | ICD-10-CM | POA: Diagnosis not present

## 2016-06-18 DIAGNOSIS — Z87891 Personal history of nicotine dependence: Secondary | ICD-10-CM | POA: Diagnosis not present

## 2016-06-20 DIAGNOSIS — Z87891 Personal history of nicotine dependence: Secondary | ICD-10-CM | POA: Diagnosis not present

## 2016-06-20 DIAGNOSIS — C3411 Malignant neoplasm of upper lobe, right bronchus or lung: Secondary | ICD-10-CM | POA: Diagnosis not present

## 2016-06-20 DIAGNOSIS — C3490 Malignant neoplasm of unspecified part of unspecified bronchus or lung: Secondary | ICD-10-CM | POA: Diagnosis not present

## 2016-06-20 DIAGNOSIS — Z510 Encounter for antineoplastic radiation therapy: Secondary | ICD-10-CM | POA: Diagnosis not present

## 2016-06-21 DIAGNOSIS — Z87891 Personal history of nicotine dependence: Secondary | ICD-10-CM | POA: Diagnosis not present

## 2016-06-21 DIAGNOSIS — C3490 Malignant neoplasm of unspecified part of unspecified bronchus or lung: Secondary | ICD-10-CM | POA: Diagnosis not present

## 2016-06-21 DIAGNOSIS — Z510 Encounter for antineoplastic radiation therapy: Secondary | ICD-10-CM | POA: Diagnosis not present

## 2016-06-21 DIAGNOSIS — C3411 Malignant neoplasm of upper lobe, right bronchus or lung: Secondary | ICD-10-CM | POA: Diagnosis not present

## 2016-06-23 DIAGNOSIS — Z510 Encounter for antineoplastic radiation therapy: Secondary | ICD-10-CM | POA: Diagnosis not present

## 2016-06-23 DIAGNOSIS — C3490 Malignant neoplasm of unspecified part of unspecified bronchus or lung: Secondary | ICD-10-CM | POA: Diagnosis not present

## 2016-06-25 DIAGNOSIS — C3411 Malignant neoplasm of upper lobe, right bronchus or lung: Secondary | ICD-10-CM | POA: Diagnosis not present

## 2016-06-25 DIAGNOSIS — Z510 Encounter for antineoplastic radiation therapy: Secondary | ICD-10-CM | POA: Diagnosis not present

## 2016-06-25 DIAGNOSIS — Z87891 Personal history of nicotine dependence: Secondary | ICD-10-CM | POA: Diagnosis not present

## 2016-06-25 DIAGNOSIS — C3490 Malignant neoplasm of unspecified part of unspecified bronchus or lung: Secondary | ICD-10-CM | POA: Diagnosis not present

## 2016-06-26 DIAGNOSIS — R0602 Shortness of breath: Secondary | ICD-10-CM | POA: Diagnosis not present

## 2016-06-26 DIAGNOSIS — Z510 Encounter for antineoplastic radiation therapy: Secondary | ICD-10-CM | POA: Diagnosis not present

## 2016-06-26 DIAGNOSIS — C3490 Malignant neoplasm of unspecified part of unspecified bronchus or lung: Secondary | ICD-10-CM | POA: Diagnosis not present

## 2016-06-26 DIAGNOSIS — C3491 Malignant neoplasm of unspecified part of right bronchus or lung: Secondary | ICD-10-CM | POA: Diagnosis not present

## 2016-06-26 DIAGNOSIS — C3411 Malignant neoplasm of upper lobe, right bronchus or lung: Secondary | ICD-10-CM | POA: Diagnosis not present

## 2016-06-26 DIAGNOSIS — E860 Dehydration: Secondary | ICD-10-CM | POA: Diagnosis not present

## 2016-06-26 DIAGNOSIS — E781 Pure hyperglyceridemia: Secondary | ICD-10-CM | POA: Diagnosis not present

## 2016-06-26 DIAGNOSIS — R000 Tachycardia, unspecified: Secondary | ICD-10-CM | POA: Diagnosis not present

## 2016-06-26 DIAGNOSIS — Z87891 Personal history of nicotine dependence: Secondary | ICD-10-CM | POA: Diagnosis not present

## 2016-06-26 DIAGNOSIS — R918 Other nonspecific abnormal finding of lung field: Secondary | ICD-10-CM | POA: Diagnosis not present

## 2016-06-27 DIAGNOSIS — C3411 Malignant neoplasm of upper lobe, right bronchus or lung: Secondary | ICD-10-CM | POA: Diagnosis not present

## 2016-06-27 DIAGNOSIS — C3490 Malignant neoplasm of unspecified part of unspecified bronchus or lung: Secondary | ICD-10-CM | POA: Diagnosis not present

## 2016-06-27 DIAGNOSIS — Z510 Encounter for antineoplastic radiation therapy: Secondary | ICD-10-CM | POA: Diagnosis not present

## 2016-06-27 DIAGNOSIS — Z87891 Personal history of nicotine dependence: Secondary | ICD-10-CM | POA: Diagnosis not present

## 2016-06-28 DIAGNOSIS — Z510 Encounter for antineoplastic radiation therapy: Secondary | ICD-10-CM | POA: Diagnosis not present

## 2016-06-28 DIAGNOSIS — C3411 Malignant neoplasm of upper lobe, right bronchus or lung: Secondary | ICD-10-CM | POA: Diagnosis not present

## 2016-06-28 DIAGNOSIS — Z87891 Personal history of nicotine dependence: Secondary | ICD-10-CM | POA: Diagnosis not present

## 2016-07-01 DIAGNOSIS — C3411 Malignant neoplasm of upper lobe, right bronchus or lung: Secondary | ICD-10-CM | POA: Diagnosis not present

## 2016-07-01 DIAGNOSIS — Z510 Encounter for antineoplastic radiation therapy: Secondary | ICD-10-CM | POA: Diagnosis not present

## 2016-07-01 DIAGNOSIS — Z87891 Personal history of nicotine dependence: Secondary | ICD-10-CM | POA: Diagnosis not present

## 2016-07-02 DIAGNOSIS — C3490 Malignant neoplasm of unspecified part of unspecified bronchus or lung: Secondary | ICD-10-CM | POA: Diagnosis not present

## 2016-07-02 DIAGNOSIS — C3491 Malignant neoplasm of unspecified part of right bronchus or lung: Secondary | ICD-10-CM | POA: Diagnosis not present

## 2016-07-02 DIAGNOSIS — Z87891 Personal history of nicotine dependence: Secondary | ICD-10-CM | POA: Diagnosis not present

## 2016-07-02 DIAGNOSIS — D709 Neutropenia, unspecified: Secondary | ICD-10-CM | POA: Diagnosis not present

## 2016-07-02 DIAGNOSIS — Z510 Encounter for antineoplastic radiation therapy: Secondary | ICD-10-CM | POA: Diagnosis not present

## 2016-07-02 DIAGNOSIS — Z5111 Encounter for antineoplastic chemotherapy: Secondary | ICD-10-CM | POA: Diagnosis not present

## 2016-07-02 DIAGNOSIS — F17201 Nicotine dependence, unspecified, in remission: Secondary | ICD-10-CM | POA: Diagnosis not present

## 2016-07-02 DIAGNOSIS — C3411 Malignant neoplasm of upper lobe, right bronchus or lung: Secondary | ICD-10-CM | POA: Diagnosis not present

## 2016-07-02 DIAGNOSIS — E781 Pure hyperglyceridemia: Secondary | ICD-10-CM | POA: Diagnosis not present

## 2016-07-03 DIAGNOSIS — C3490 Malignant neoplasm of unspecified part of unspecified bronchus or lung: Secondary | ICD-10-CM | POA: Diagnosis not present

## 2016-07-03 DIAGNOSIS — Z5111 Encounter for antineoplastic chemotherapy: Secondary | ICD-10-CM | POA: Diagnosis not present

## 2016-07-03 DIAGNOSIS — D709 Neutropenia, unspecified: Secondary | ICD-10-CM | POA: Diagnosis not present

## 2016-07-04 DIAGNOSIS — D709 Neutropenia, unspecified: Secondary | ICD-10-CM | POA: Diagnosis not present

## 2016-07-04 DIAGNOSIS — C3490 Malignant neoplasm of unspecified part of unspecified bronchus or lung: Secondary | ICD-10-CM | POA: Diagnosis not present

## 2016-07-04 DIAGNOSIS — Z5111 Encounter for antineoplastic chemotherapy: Secondary | ICD-10-CM | POA: Diagnosis not present

## 2016-07-05 DIAGNOSIS — C3490 Malignant neoplasm of unspecified part of unspecified bronchus or lung: Secondary | ICD-10-CM | POA: Diagnosis not present

## 2016-07-05 DIAGNOSIS — D709 Neutropenia, unspecified: Secondary | ICD-10-CM | POA: Diagnosis not present

## 2016-07-05 DIAGNOSIS — Z5111 Encounter for antineoplastic chemotherapy: Secondary | ICD-10-CM | POA: Diagnosis not present

## 2016-07-11 DIAGNOSIS — C3491 Malignant neoplasm of unspecified part of right bronchus or lung: Secondary | ICD-10-CM | POA: Diagnosis not present

## 2016-07-11 DIAGNOSIS — D649 Anemia, unspecified: Secondary | ICD-10-CM | POA: Diagnosis not present

## 2016-07-11 DIAGNOSIS — R071 Chest pain on breathing: Secondary | ICD-10-CM | POA: Diagnosis not present

## 2016-07-12 DIAGNOSIS — D709 Neutropenia, unspecified: Secondary | ICD-10-CM | POA: Diagnosis not present

## 2016-07-12 DIAGNOSIS — C3490 Malignant neoplasm of unspecified part of unspecified bronchus or lung: Secondary | ICD-10-CM | POA: Diagnosis not present

## 2016-07-12 DIAGNOSIS — Z5111 Encounter for antineoplastic chemotherapy: Secondary | ICD-10-CM | POA: Diagnosis not present

## 2016-07-19 ENCOUNTER — Encounter: Payer: Self-pay | Admitting: Family Medicine

## 2016-07-19 ENCOUNTER — Ambulatory Visit (INDEPENDENT_AMBULATORY_CARE_PROVIDER_SITE_OTHER): Payer: Medicare Other | Admitting: Family Medicine

## 2016-07-19 ENCOUNTER — Ambulatory Visit: Payer: Medicare Other | Admitting: Family Medicine

## 2016-07-19 VITALS — BP 122/64 | HR 94 | Temp 98.0°F | Resp 16 | Ht <= 58 in | Wt 163.0 lb

## 2016-07-19 DIAGNOSIS — M545 Low back pain: Secondary | ICD-10-CM

## 2016-07-19 DIAGNOSIS — C3491 Malignant neoplasm of unspecified part of right bronchus or lung: Secondary | ICD-10-CM | POA: Diagnosis not present

## 2016-07-19 DIAGNOSIS — G8929 Other chronic pain: Secondary | ICD-10-CM

## 2016-07-19 DIAGNOSIS — E119 Type 2 diabetes mellitus without complications: Secondary | ICD-10-CM | POA: Diagnosis not present

## 2016-07-19 DIAGNOSIS — G4700 Insomnia, unspecified: Secondary | ICD-10-CM

## 2016-07-19 DIAGNOSIS — F419 Anxiety disorder, unspecified: Secondary | ICD-10-CM

## 2016-07-19 LAB — POCT GLYCOSYLATED HEMOGLOBIN (HGB A1C)
ESTIMATED AVERAGE GLUCOSE: 137
HEMOGLOBIN A1C: 6.4

## 2016-07-19 MED ORDER — ZOLPIDEM TARTRATE 10 MG PO TABS: 10.0000 mg | ORAL_TABLET | Freq: Every day | ORAL | 5 refills | Status: DC

## 2016-07-19 MED ORDER — ALPRAZOLAM 0.25 MG PO TABS: 0.2500 mg | ORAL_TABLET | Freq: Three times a day (TID) | ORAL | 5 refills | Status: DC | PRN

## 2016-07-19 MED ORDER — OXYCODONE-ACETAMINOPHEN 10-325 MG PO TABS: 1.0000 | ORAL_TABLET | ORAL | 0 refills | Status: DC | PRN

## 2016-07-19 NOTE — Progress Notes (Signed)
Patient: Yvette Riley Female    DOB: 04/01/1955   61 y.o.   MRN: [id number] Visit Date: 07/19/2016  Today's Provider: Lelon Huh, MD   Chief Complaint  Patient presents with  . Diabetes  . Pain   Subjective:    HPI  Diabetes Mellitus Type II, Follow-up:   Lab Results  Component Value Date   HGBA1C 6.4 07/19/2016   HGBA1C 6.0 02/19/2016   HGBA1C 7.2 11/13/2015    Last seen for diabetes 7 weeks ago.  Management since then includes decreasing Metformin to 1000mg  in the mornings. She reports good compliance with treatment. She is having side effects.  Current symptoms include none and have been stable. Home blood sugar records: fasting range: 100s  Episodes of hypoglycemia? no   Current Insulin Regimen: none Most Recent Eye Exam: 05/2016. Normal.  Weight trend: stable Prior visit with dietician: no Current diet: well balanced Current exercise: none  Pertinent Labs:    Component Value Date/Time   CREATININE 0.67 03/20/2016 0606   CREATININE 0.68 02/02/2012 1114    Wt Readings from Last 3 Encounters:  07/19/16 163 lb (73.9 kg)  05/29/16 169 lb (76.7 kg)  05/13/16 171 lb (77.6 kg)    Chronic back pain: Patient was last seen in the office 7 weeks ago. No changes were made in her medications. She is currently taking Oxycodone/APAP 10/325mg  as needed for pain. She reports good symptom control.   Loss of appetite, follow up: Patient reports that this has improved since her last OV 7 weeks ago. She eats on average 2 meals daily. She was started on remeron to replace zolpidem which she finds helps sleep, but sometimes she gets in middle of night ravished and can't stop eating. She would like to to just take remeron prn.     Allergies  Allergen Reactions  . Codeine Anaphylaxis  . Crestor [Rosuvastatin] Anaphylaxis  . Other Anaphylaxis  . Penicillins Anaphylaxis, Rash and Other (See Comments)    Reaction:  Tongue swelling  Has patient had a PCN  reaction causing immediate rash, facial/tongue/throat swelling, SOB or lightheadedness with hypotension:  Yes   Has patient had a PCN reaction causing severe rash involving mucus membranes or skin necrosis: No Has patient had a PCN reaction that required hospitalization No Has patient had a PCN reaction occurring within the last 10 years: No If all of the above answers are "NO", then may proceed with Cephalosporin use.  . Yellow Jacket Venom [Bee Venom] Anaphylaxis  . Gemfibrozil Rash, Nausea And Vomiting and Swelling  . Statins Nausea And Vomiting and Swelling  . Trazodone And Nefazodone Nausea And Vomiting  . Lipitor [Atorvastatin] Rash     Current Outpatient Prescriptions:  .  albuterol (PROVENTIL) (2.5 MG/3ML) 0.083% nebulizer solution, Take 3 mLs (2.5 mg total) by nebulization every 6 (six) hours as needed for wheezing or shortness of breath., Disp: 150 mL, Rfl: 1 .  ALPRAZolam (XANAX) 0.25 MG tablet, Take 1 tablet (0.25 mg total) by mouth 3 (three) times daily as needed for anxiety., Disp: 60 tablet, Rfl: 5 .  aspirin EC 81 MG tablet, Take 81 mg by mouth daily. , Disp: , Rfl:  .  ferrous sulfate 325 (65 FE) MG tablet, Take 1 tablet (325 mg total) by mouth 2 (two) times daily with a meal., Disp: 30 tablet, Rfl: 3 .  glucose blood test strip, Check blood sugar daily. Dx Type 2 diabetes E11.9, Disp: 100 each, Rfl: 3 .  ketoconazole (NIZORAL) 2 % cream, Apply 1 application topically daily., Disp: 60 g, Rfl: 0 .  meclizine (ANTIVERT) 25 MG tablet, TAKE ONE TABLET BY MOUTH THREE TIMES DAILY AS NEEDED FOR DIZZINESS, Disp: 30 tablet, Rfl: 5 .  meloxicam (MOBIC) 15 MG tablet, Take 1 tablet (15 mg total) by mouth daily as needed for pain., Disp: 90 tablet, Rfl: 2 .  metFORMIN (GLUCOPHAGE XR) 500 MG 24 hr tablet, Take 2 tablets (1,000 mg total) by mouth daily with breakfast. With meals, Disp: 1 tablet, Rfl: 1 .  mirtazapine (REMERON) 15 MG tablet, Take 1 tablet (15 mg total) by mouth at bedtime.,  Disp: 30 tablet, Rfl: 3 .  montelukast (SINGULAIR) 10 MG tablet, Take 10 mg by mouth at bedtime., Disp: , Rfl:  .  Nutritional Supplements (GLUCERNA ADVANCE SHAKE) LIQD, Drink 1-2 shakes per day, Disp: 24 Bottle, Rfl: 12 .  oxyCODONE-acetaminophen (PERCOCET) 10-325 MG tablet, Take 1 tablet by mouth every 4 (four) hours as needed (for pain.)., Disp: 180 tablet, Rfl: 0 .  prochlorperazine (COMPAZINE) 10 MG tablet, Take 1 tablet by mouth every 6 (six) hours as needed., Disp: , Rfl:  .  umeclidinium-vilanterol (ANORO ELLIPTA) 62.5-25 MCG/INH AEPB, Inhale 1 puff into the lungs daily., Disp: 60 each, Rfl: 4  Review of Systems  Constitutional: Positive for appetite change and fatigue.  Respiratory: Negative.   Cardiovascular: Negative.   Endocrine: Negative.   Musculoskeletal: Positive for myalgias.  Skin: Positive for color change.       Due to radiation and chemotherapy.   Neurological: Positive for headaches.  Psychiatric/Behavioral: Negative.     Social History  Substance Use Topics  . Smoking status: Former Smoker    Packs/day: 0.25    Years: 45.00    Types: Cigarettes    Quit date: 03/17/2016  . Smokeless tobacco: Never Used     Comment: previously smoked 2 1/2 ppd for 30 years.   . Alcohol use No   Objective:   BP 122/64 (BP Location: Right Arm, Patient Position: Sitting, Cuff Size: Normal)   Pulse 94   Temp 98 F (36.7 C)   Resp 16   Ht 4\' 10"  (1.473 m)   Wt 163 lb (73.9 kg)   SpO2 98%   BMI 34.07 kg/m  Vitals:   07/19/16 1440  BP: 122/64  Pulse: 94  Resp: 16  Temp: 98 F (36.7 C)  SpO2: 98%  Weight: 163 lb (73.9 kg)  Height: 4\' 10"  (1.473 m)     Physical Exam   General Appearance:    Alert, cooperative, no distress  Eyes:    PERRL, conjunctiva/corneas clear, EOM's intact       Lungs:     Clear to auscultation bilaterally, respirations unlabored  Heart:    Regular rate and rhythm  Neurologic:   Awake, alert, oriented x 3. No apparent focal neurological            defect.         Results for orders placed or performed in visit on 07/19/16  POCT glycosylated hemoglobin (Hb A1C)  Result Value Ref Range   Hemoglobin A1C 6.4    Est. average glucose Bld gHb Est-mCnc 137        Assessment & Plan:     1. Type 2 diabetes mellitus without complication, without long-term current use of insulin (HCC) Well controlled.  Continue current medications.   - POCT glycosylated hemoglobin (Hb A1C)  2. Chronic midline low back pain, with sciatica presence  unspecified Pain adequately controlled rf - oxyCODONE-acetaminophen (PERCOCET) 10-325 MG tablet; Take 1 tablet by mouth every 4 (four) hours as needed (for pain.).  Dispense: 180 tablet; Refill: 0  3. Small cell lung cancer, right ([place])   4. Insomnia, unspecified type She doesn't like taking Remeron every night because it makes her feel like she is starving, get back on Ambien which she can alternate with Remeron if she likes.  - zolpidem (AMBIEN) 10 MG tablet; Take 1 tablet (10 mg total) by mouth at bedtime.  Dispense: 30 tablet; Refill: 5  5. Anxiety Doing well with alprazolam. Reminded not to take at same time as her pain medications or sleeping medications.  - ALPRAZolam (XANAX) 0.25 MG tablet; Take 1 tablet (0.25 mg total) by mouth 3 (three) times daily as needed for anxiety.  Dispense: 60 tablet; Refill: 5  Return in about 4 months (around 11/18/2016).       Lelon Huh, MD  [facility]

## 2016-07-23 DIAGNOSIS — F17201 Nicotine dependence, unspecified, in remission: Secondary | ICD-10-CM | POA: Diagnosis not present

## 2016-07-23 DIAGNOSIS — Z5111 Encounter for antineoplastic chemotherapy: Secondary | ICD-10-CM | POA: Diagnosis not present

## 2016-07-23 DIAGNOSIS — C3490 Malignant neoplasm of unspecified part of unspecified bronchus or lung: Secondary | ICD-10-CM | POA: Diagnosis not present

## 2016-07-23 DIAGNOSIS — D709 Neutropenia, unspecified: Secondary | ICD-10-CM | POA: Diagnosis not present

## 2016-07-23 DIAGNOSIS — C3491 Malignant neoplasm of unspecified part of right bronchus or lung: Secondary | ICD-10-CM | POA: Diagnosis not present

## 2016-07-24 ENCOUNTER — Telehealth: Payer: Self-pay | Admitting: Family Medicine

## 2016-07-24 DIAGNOSIS — Z5111 Encounter for antineoplastic chemotherapy: Secondary | ICD-10-CM | POA: Diagnosis not present

## 2016-07-24 DIAGNOSIS — G8929 Other chronic pain: Secondary | ICD-10-CM

## 2016-07-24 DIAGNOSIS — M545 Low back pain: Principal | ICD-10-CM

## 2016-07-24 DIAGNOSIS — C3490 Malignant neoplasm of unspecified part of unspecified bronchus or lung: Secondary | ICD-10-CM | POA: Diagnosis not present

## 2016-07-24 DIAGNOSIS — D709 Neutropenia, unspecified: Secondary | ICD-10-CM | POA: Diagnosis not present

## 2016-07-24 NOTE — Telephone Encounter (Signed)
Pt is having a problem with the way the medication is written.  She states she is not getting enough pills per month because of the way the rx is written.  oxyCODONE-acetaminophen (PERCOCET) 10-325 MG tablet  Pt uses [facility]  Pt is aware Dr. Caryn Section is out until Monday.  Pt's call back is  [phone]  Thanks teri

## 2016-07-25 DIAGNOSIS — Z5111 Encounter for antineoplastic chemotherapy: Secondary | ICD-10-CM | POA: Diagnosis not present

## 2016-07-25 DIAGNOSIS — D709 Neutropenia, unspecified: Secondary | ICD-10-CM | POA: Diagnosis not present

## 2016-07-25 DIAGNOSIS — C3490 Malignant neoplasm of unspecified part of unspecified bronchus or lung: Secondary | ICD-10-CM | POA: Diagnosis not present

## 2016-07-26 DIAGNOSIS — Z5111 Encounter for antineoplastic chemotherapy: Secondary | ICD-10-CM | POA: Diagnosis not present

## 2016-07-26 DIAGNOSIS — C3490 Malignant neoplasm of unspecified part of unspecified bronchus or lung: Secondary | ICD-10-CM | POA: Diagnosis not present

## 2016-07-26 DIAGNOSIS — D709 Neutropenia, unspecified: Secondary | ICD-10-CM | POA: Diagnosis not present

## 2016-07-29 NOTE — Telephone Encounter (Signed)
I don't know what the problem. It is written every four hours (six per day) with 180 tablets, which is  30 day supply.

## 2016-07-30 NOTE — Telephone Encounter (Signed)
LMTCB ED 

## 2016-07-30 NOTE — Telephone Encounter (Signed)
FYI-Patient is saying that her Rx was for 180 pills and the pharmacy only gave her 120.  She was advised that she needed to talk to the pharmacy and she said that when she did they were unconcerned.  She now has her new Rx and does not need anything further from [place].

## 2016-08-05 ENCOUNTER — Ambulatory Visit (INDEPENDENT_AMBULATORY_CARE_PROVIDER_SITE_OTHER): Payer: Medicare Other | Admitting: Family Medicine

## 2016-08-05 ENCOUNTER — Encounter: Payer: Self-pay | Admitting: Family Medicine

## 2016-08-05 VITALS — BP 118/70 | HR 88 | Temp 98.5°F | Resp 16 | Wt 163.0 lb

## 2016-08-05 DIAGNOSIS — M545 Low back pain: Secondary | ICD-10-CM | POA: Diagnosis not present

## 2016-08-05 DIAGNOSIS — G8929 Other chronic pain: Secondary | ICD-10-CM

## 2016-08-05 DIAGNOSIS — J069 Acute upper respiratory infection, unspecified: Secondary | ICD-10-CM

## 2016-08-05 MED ORDER — OXYCODONE-ACETAMINOPHEN 10-325 MG PO TABS: ORAL_TABLET | ORAL | 0 refills | Status: DC

## 2016-08-05 MED ORDER — MELOXICAM 15 MG PO TABS: 15.0000 mg | ORAL_TABLET | Freq: Every day | ORAL | 2 refills | Status: DC | PRN

## 2016-08-05 NOTE — Progress Notes (Signed)
Patient: Yvette Riley Female    DOB: 12/02/1955   61 y.o.   MRN: [id number] Visit Date: 08/05/2016  Today's Provider: Lelon Huh, MD   Chief Complaint  Patient presents with  . URI   Subjective:    URI   This is a new problem. The current episode started in the past 7 days. The problem has been gradually worsening. There has been no fever. Associated symptoms include chest pain, congestion and coughing. She has tried antihistamine and decongestant (patient reports that she called her oncologist and they prescribed her a Zpak) for the symptoms. The treatment provided mild relief.   She also is out of her pain medications. She states the pharmacist at [facility] in [place] only dispensed 120 tablets with last prescription. Patient states that she called the pharmacy and was told that they only dispensed 120 tablets because prescription was written for every four hours instead of every 4-6 hours. However the [place] shows that 180 tablets were dispensed.     Allergies  Allergen Reactions  . Codeine Anaphylaxis  . Crestor [Rosuvastatin] Anaphylaxis  . Other Anaphylaxis  . Penicillins Anaphylaxis, Rash and Other (See Comments)    Reaction:  Tongue swelling  Has patient had a PCN reaction causing immediate rash, facial/tongue/throat swelling, SOB or lightheadedness with hypotension:  Yes   Has patient had a PCN reaction causing severe rash involving mucus membranes or skin necrosis: No Has patient had a PCN reaction that required hospitalization No Has patient had a PCN reaction occurring within the last 10 years: No If all of the above answers are "NO", then may proceed with Cephalosporin use.  . Yellow Jacket Venom [Bee Venom] Anaphylaxis  . Gemfibrozil Rash, Nausea And Vomiting and Swelling  . Statins Nausea And Vomiting and Swelling  . Trazodone And Nefazodone Nausea And Vomiting  . Lipitor [Atorvastatin] Rash     Current Outpatient Prescriptions:  .  albuterol  (PROVENTIL) (2.5 MG/3ML) 0.083% nebulizer solution, Take 3 mLs (2.5 mg total) by nebulization every 6 (six) hours as needed for wheezing or shortness of breath., Disp: 150 mL, Rfl: 1 .  ALPRAZolam (XANAX) 0.25 MG tablet, Take 1 tablet (0.25 mg total) by mouth 3 (three) times daily as needed for anxiety., Disp: 60 tablet, Rfl: 5 .  aspirin EC 81 MG tablet, Take 81 mg by mouth daily. , Disp: , Rfl:  .  ferrous sulfate 325 (65 FE) MG tablet, Take 1 tablet (325 mg total) by mouth 2 (two) times daily with a meal., Disp: 30 tablet, Rfl: 3 .  glucose blood test strip, Check blood sugar daily. Dx Type 2 diabetes E11.9, Disp: 100 each, Rfl: 3 .  ketoconazole (NIZORAL) 2 % cream, Apply 1 application topically daily., Disp: 60 g, Rfl: 0 .  meclizine (ANTIVERT) 25 MG tablet, TAKE ONE TABLET BY MOUTH THREE TIMES DAILY AS NEEDED FOR DIZZINESS, Disp: 30 tablet, Rfl: 5 .  meloxicam (MOBIC) 15 MG tablet, Take 1 tablet (15 mg total) by mouth daily as needed for pain., Disp: 90 tablet, Rfl: 2 .  metFORMIN (GLUCOPHAGE XR) 500 MG 24 hr tablet, Take 2 tablets (1,000 mg total) by mouth daily with breakfast. With meals, Disp: 1 tablet, Rfl: 1 .  mirtazapine (REMERON) 15 MG tablet, Take 1 tablet (15 mg total) by mouth at bedtime., Disp: 30 tablet, Rfl: 3 .  montelukast (SINGULAIR) 10 MG tablet, Take 10 mg by mouth at bedtime., Disp: , Rfl:  .  Nutritional Supplements (  GLUCERNA ADVANCE SHAKE) LIQD, Drink 1-2 shakes per day, Disp: 24 Bottle, Rfl: 12 .  oxyCODONE-acetaminophen (PERCOCET) 10-325 MG tablet, Take 1 tablet by mouth every 4 (four) hours as needed (for pain.)., Disp: 180 tablet, Rfl: 0 .  prochlorperazine (COMPAZINE) 10 MG tablet, Take 1 tablet by mouth every 6 (six) hours as needed., Disp: , Rfl:  .  umeclidinium-vilanterol (ANORO ELLIPTA) 62.5-25 MCG/INH AEPB, Inhale 1 puff into the lungs daily., Disp: 60 each, Rfl: 4 .  zolpidem (AMBIEN) 10 MG tablet, Take 1 tablet (10 mg total) by mouth at bedtime., Disp: 30  tablet, Rfl: 5  Review of Systems  HENT: Positive for congestion.   Respiratory: Positive for cough.   Cardiovascular: Positive for chest pain.    Social History  Substance Use Topics  . Smoking status: Former Smoker    Packs/day: 0.25    Years: 45.00    Types: Cigarettes    Quit date: 03/17/2016  . Smokeless tobacco: Never Used     Comment: previously smoked 2 1/2 ppd for 30 years.   . Alcohol use No   Objective:   BP 118/70 (BP Location: Right Arm, Patient Position: Sitting, Cuff Size: Normal)   Pulse 88   Temp 98.5 F (36.9 C)   Resp 16   Wt 163 lb (73.9 kg)   SpO2 99%   BMI 34.07 kg/m  Vitals:   08/05/16 1515  BP: 118/70  Pulse: 88  Resp: 16  Temp: 98.5 F (36.9 C)  SpO2: 99%  Weight: 163 lb (73.9 kg)     Physical Exam  General Appearance:    Alert, cooperative, no distress  HENT:   bilateral TM fluid noted, throat normal without erythema or exudate and nasal mucosa pale and congested  Eyes:    PERRL, conjunctiva/corneas clear, EOM's intact       Lungs:     Clear to auscultation bilaterally, respirations unlabored  Heart:    Regular rate and rhythm  Neurologic:   Awake, alert, oriented x 3. No apparent focal neurological           defect.           Assessment & Plan:     1. Upper respiratory tract infection, unspecified type Already on Zithromax prescribed by oncology which she is to finish. Call if symptoms change or if not rapidly improving.    2. Chronic midline low back pain, with sciatica presence unspecified Patient states she was only dispensed 120 oxycodone/apap and told by pharmacy that the usual 180 tablets was not dispensed because the prescription was written Q4. I advised that patient that [place] reported 180 tablets dispense and that if this was not correct she needs to report the discrepancy. Previous prescriptions were rewritten to allow them to be dispensed effective today.  - oxyCODONE-acetaminophen (PERCOCET) 10-325 MG tablet; One  tablet every 4-6 hours as needed  Dispense: 180 tablet; Refill: 0       Lelon Huh, MD  [facility]

## 2016-08-11 ENCOUNTER — Emergency Department: Payer: Medicare Other

## 2016-08-11 ENCOUNTER — Emergency Department
Admission: EM | Admit: 2016-08-11 | Discharge: 2016-08-12 | Disposition: A | Discharge status: Home / Self Care | Payer: Medicare Other | Attending: Emergency Medicine | Admitting: Emergency Medicine

## 2016-08-11 ENCOUNTER — Encounter: Payer: Self-pay | Admitting: Emergency Medicine

## 2016-08-11 DIAGNOSIS — R05 Cough: Secondary | ICD-10-CM | POA: Diagnosis not present

## 2016-08-11 DIAGNOSIS — Z87891 Personal history of nicotine dependence: Secondary | ICD-10-CM | POA: Insufficient documentation

## 2016-08-11 DIAGNOSIS — Z7982 Long term (current) use of aspirin: Secondary | ICD-10-CM | POA: Insufficient documentation

## 2016-08-11 DIAGNOSIS — R0602 Shortness of breath: Secondary | ICD-10-CM

## 2016-08-11 DIAGNOSIS — Z7984 Long term (current) use of oral hypoglycemic drugs: Secondary | ICD-10-CM | POA: Insufficient documentation

## 2016-08-11 DIAGNOSIS — E119 Type 2 diabetes mellitus without complications: Secondary | ICD-10-CM | POA: Diagnosis not present

## 2016-08-11 DIAGNOSIS — J700 Acute pulmonary manifestations due to radiation: Secondary | ICD-10-CM | POA: Diagnosis not present

## 2016-08-11 DIAGNOSIS — W888XXA Exposure to other ionizing radiation, initial encounter: Secondary | ICD-10-CM | POA: Diagnosis not present

## 2016-08-11 LAB — CBC WITH DIFFERENTIAL/PLATELET
Basophils Absolute: 0 10*3/uL (ref 0–0.1)
Basophils Relative: 0 %
EOS ABS: 0.1 10*3/uL (ref 0–0.7)
Eosinophils Relative: 1 %
HEMATOCRIT: 26.8 % — AB (ref 35.0–47.0)
HEMOGLOBIN: 9.2 g/dL — AB (ref 12.0–16.0)
LYMPHS ABS: 1.2 10*3/uL (ref 1.0–3.6)
Lymphocytes Relative: 13 %
MCH: 30.7 pg (ref 26.0–34.0)
MCHC: 34.2 g/dL (ref 32.0–36.0)
MCV: 89.8 fL (ref 80.0–100.0)
MONOS PCT: 6 %
Monocytes Absolute: 0.6 10*3/uL (ref 0.2–0.9)
NEUTROS ABS: 7.9 10*3/uL — AB (ref 1.4–6.5)
NEUTROS PCT: 80 %
Platelets: 311 10*3/uL (ref 150–440)
RBC: 2.98 MIL/uL — ABNORMAL LOW (ref 3.80–5.20)
RDW: 21.9 % — ABNORMAL HIGH (ref 11.5–14.5)
WBC: 9.9 10*3/uL (ref 3.6–11.0)

## 2016-08-11 LAB — BASIC METABOLIC PANEL
Anion gap: 8 (ref 5–15)
BUN: 7 mg/dL (ref 6–20)
CHLORIDE: 109 mmol/L (ref 101–111)
CO2: 26 mmol/L (ref 22–32)
CREATININE: 0.5 mg/dL (ref 0.44–1.00)
Calcium: 9.3 mg/dL (ref 8.9–10.3)
GFR calc Af Amer: >60 mL/min (ref >60)
GFR calc non Af Amer: >60 mL/min (ref >60)
GLUCOSE: 112 mg/dL — AB (ref 65–99)
Potassium: 3.4 mmol/L — ABNORMAL LOW (ref 3.5–5.1)
Sodium: 143 mmol/L (ref 135–145)

## 2016-08-11 LAB — TROPONIN I: Troponin I: <0.03 ng/mL (ref <0.03)

## 2016-08-11 MED ORDER — IPRATROPIUM-ALBUTEROL 0.5-2.5 (3) MG/3ML IN SOLN
3.0000 mL | Freq: Once | RESPIRATORY_TRACT | Status: AC
  Administered 2016-08-11: 3 mL via RESPIRATORY_TRACT
  Filled 2016-08-11: qty 3

## 2016-08-11 NOTE — ED Provider Notes (Signed)
[facility] Emergency Department Provider Note    ____________________________________________   I have reviewed the triage vital signs and the nursing notes.   HISTORY  Chief Complaint Shortness of Breath   History limited by: Not Limited   HPI Yvette Riley is a 61 y.o. female who presents to the emergency department today because of concerns for possible shortness of breath. The patient has a hard time describing if she is truly feeling short of breath. She does states she's been having issues with an acid in his head and some burping today which she rarely does. She is unsure if she just has a sensation that she needs to continue to burp she truly has shortness of breath. No significant chest pain. She was recently treated for an upper respiratory infection and states that her symptoms from that infection.    Past Medical History:  Diagnosis Date  . Anemia   . Asthma   . C. difficile diarrhea 04/07/2016  . Cancer ([place])    lung ca  . CAP (community acquired pneumonia) 03/21/2015  . COPD (chronic obstructive pulmonary disease) ([place])   . Diabetes mellitus without complication ([place])   . Displacement of lumbar intervertebral disc without myelopathy   . Emphysema of lung ([place])   . History of chicken pox   . Pneumonia 03/29/2015  . Shortness of breath     Patient Active Problem List   Diagnosis Date Noted  . Anxiety 07/19/2016  . Small cell lung cancer, right ([place]) 05/13/2016  . Chemoprophylaxis 04/29/2016  . Severe obesity (BMI 35.0-39.9) with comorbidity ([place]) 04/16/2016  . Elevated d-dimer 04/06/2016  . Weakness generalized 04/06/2016  . Sepsis ([place]) 03/17/2016  . Mass of upper lobe of right lung 03/01/2016  . Coronary atherosclerosis 02/29/2016  . Personal history of tobacco use, presenting hazards to health 02/28/2016  . Allergic rhinitis 09/27/2014  . Asthma 09/27/2014  . Anemia 09/27/2014  . Hyperglyceridemia, pure 09/27/2014  .  Glaucoma 09/27/2014  . Hip pain 09/27/2014  . Right knee pain 09/27/2014  . Displacement of lumbar intervertebral disc without myelopathy 09/27/2014  . Thoracic back pain 09/27/2014  . Insomnia 08/11/2014  . COPD (chronic obstructive pulmonary disease) ([place]) 06/12/2014  . Type 2 diabetes mellitus ([place]) 06/12/2014  . Back pain 06/12/2014  . Essential hypertension 03/28/2014  . Smoking greater than 30 pack years 03/28/2014    Past Surgical History:  Procedure Laterality Date  . CESAREAN SECTION    . TUBAL LIGATION      Prior to Admission medications   Medication Sig Start Date End Date Taking? Authorizing Provider  albuterol (PROVENTIL) (2.5 MG/3ML) 0.083% nebulizer solution Take 3 mLs (2.5 mg total) by nebulization every 6 (six) hours as needed for wheezing or shortness of breath. 04/06/15   Birdie Sons, MD  ALPRAZolam Duanne Moron) 0.25 MG tablet Take 1 tablet (0.25 mg total) by mouth 3 (three) times daily as needed for anxiety. 07/19/16   Birdie Sons, MD  aspirin EC 81 MG tablet Take 81 mg by mouth daily.     [provider]  ferrous sulfate 325 (65 FE) MG tablet Take 1 tablet (325 mg total) by mouth 2 (two) times daily with a meal. 03/20/16   Vaughan Basta, MD  glucose blood test strip Check blood sugar daily. Dx Type 2 diabetes E11.9 06/07/15   Birdie Sons, MD  ketoconazole (NIZORAL) 2 % cream Apply 1 application topically daily. 02/20/16   Birdie Sons, MD  meclizine (  ANTIVERT) 25 MG tablet TAKE ONE TABLET BY MOUTH THREE TIMES DAILY AS NEEDED FOR DIZZINESS 02/16/16   Birdie Sons, MD  meloxicam (MOBIC) 15 MG tablet Take 1 tablet (15 mg total) by mouth daily as needed for pain. 08/05/16   Birdie Sons, MD  metFORMIN (GLUCOPHAGE XR) 500 MG 24 hr tablet Take 2 tablets (1,000 mg total) by mouth daily with breakfast. With meals 05/29/16   Birdie Sons, MD  mirtazapine (REMERON) 15 MG tablet Take 1 tablet (15 mg total) by mouth at bedtime. 05/29/16   Birdie Sons, MD  montelukast (SINGULAIR) 10 MG tablet Take 10 mg by mouth at bedtime.    [provider]  Nutritional Supplements ([place]) LIQD Drink 1-2 shakes per day 05/01/16   Mar Daring, PA-C  oxyCODONE-acetaminophen [facility]) 10-325 MG tablet One tablet every 4-6 hours as needed 08/05/16   Birdie Sons, MD  prochlorperazine (COMPAZINE) 10 MG tablet Take 1 tablet by mouth every 6 (six) hours as needed. 05/29/16   [provider]  umeclidinium-vilanterol (ANORO ELLIPTA) 62.5-25 MCG/INH AEPB Inhale 1 puff into the lungs daily. 03/27/16   Laverle Hobby, MD  zolpidem (AMBIEN) 10 MG tablet Take 1 tablet (10 mg total) by mouth at bedtime. 07/19/16   Birdie Sons, MD    Allergies Codeine; Crestor [rosuvastatin]; Other; Penicillins; Yellow jacket venom [bee venom]; Gemfibrozil; Statins; Trazodone and nefazodone; and Lipitor [atorvastatin]  Family History  Problem Relation Age of Onset  . Heart failure Mother   . Heart disease Mother   . Stroke Mother   . Heart disease Brother   . COPD Brother   . Cancer Maternal Aunt        Breast Cancer    Social History Social History  Substance Use Topics  . Smoking status: Former Smoker    Packs/day: 0.25    Years: 45.00    Types: Cigarettes    Quit date: 03/17/2016  . Smokeless tobacco: Never Used     Comment: previously smoked 2 1/2 ppd for 30 years.   . Alcohol use No    Review of Systems Constitutional: No fever/chills Eyes: No visual changes. ENT: No sore throat. Cardiovascular: Denies chest pain. Respiratory: Positive for shortness of breath. Gastrointestinal: No abdominal pain.  No nausea, no vomiting.  No diarrhea.   Genitourinary: Negative for dysuria. Musculoskeletal: Negative for back pain. Skin: Negative for rash. Neurological: Negative for headaches, focal weakness or numbness.  ____________________________________________   PHYSICAL EXAM:  VITAL SIGNS: ED Triage  Vitals  Enc Vitals Group     BP 08/11/16 2111 (!) 149/94     Pulse Rate 08/11/16 2111 90     Resp 08/11/16 2111 (!) 24     Temp 08/11/16 2113 97.9 F (36.6 C)     Temp Source 08/11/16 2113 Oral     SpO2 08/11/16 2111 99 %     Weight 08/11/16 2109 163 lb (73.9 kg)     Height 08/11/16 2109 4\' 11"  (1.499 m)     Head Circumference --      Peak Flow --      Pain Score 08/11/16 2125 0   Constitutional: Alert and oriented. Well appearing and in no distress. Eyes: Conjunctivae are normal.  ENT   Head: Normocephalic and atraumatic.   Nose: No congestion/rhinnorhea.   Mouth/Throat: Mucous membranes are moist.   Neck: No stridor. Hematological/Lymphatic/Immunilogical: No cervical lymphadenopathy. Cardiovascular: Normal rate, regular rhythm.  No murmurs, rubs, or gallops.  Respiratory: Normal respiratory effort without tachypnea nor retractions. Breath sounds are clear and equal bilaterally. No wheezes/rales/rhonchi. Gastrointestinal: Soft and non tender. No rebound. No guarding.  Genitourinary: Deferred Musculoskeletal: Normal range of motion in all extremities. No lower extremity edema. Neurologic:  Normal speech and language. No gross focal neurologic deficits are appreciated.  Skin:  Skin is warm, dry and intact. No rash noted. Psychiatric: Mood and affect are normal. Speech and behavior are normal. Patient exhibits appropriate insight and judgment.  ____________________________________________    LABS (pertinent positives/negatives)  Labs Reviewed  CBC WITH DIFFERENTIAL/PLATELET - Abnormal; Notable for the following:       Result Value   RBC 2.98 (*)    Hemoglobin 9.2 (*)    HCT 26.8 (*)    RDW 21.9 (*)    Neutro Abs 7.9 (*)    All other components within normal limits  BASIC METABOLIC PANEL - Abnormal; Notable for the following:    Potassium 3.4 (*)    Glucose, Bld 112 (*)    All other components within normal limits  TROPONIN I      ____________________________________________   EKG  I, Nance Pear, attending physician, personally viewed and interpreted this EKG  EKG Time: 2113 Rate: 87 Rhythm: sinus rhythm Axis: normal Intervals: qtc 474 QRS: IVCD ST changes: no st elevation Impression: abnormal ekg   ____________________________________________    RADIOLOGY  CXR IMPRESSION:  1. Probable postradiation changes developing in the right lung,  given the time course, likely to reflect mild postradiation  pneumonitis.     ____________________________________________   PROCEDURES  Procedures  ____________________________________________   INITIAL IMPRESSION / ASSESSMENT AND PLAN / ED COURSE  Pertinent labs & imaging results that were available during my care of the patient were reviewed by me and considered in my medical decision making (see chart for details).  Patient presented to the emergency department today because of concerns for shortness of breath. She is x-ray showed likely radiation pneumonitis. She was given a breathing treatment here and did feel better afterwards. This point I do feel he patient is safe for discharge. Will outpatient follow-up with primary care.  ____________________________________________   FINAL CLINICAL IMPRESSION(S) / ED DIAGNOSES  Final diagnoses:  SOB (shortness of breath)  Radiation pneumonitis [facility])     Note: This dictation was prepared with Dragon dictation. Any transcriptional errors that result from this process are unintentional     Nance Pear, MD 08/11/16 [phone]

## 2016-08-11 NOTE — Discharge Instructions (Signed)
Please seek medical attention for any high fevers, chest pain, shortness of breath, change in behavior, persistent vomiting, bloody stool or any other new or concerning symptoms.  

## 2016-08-11 NOTE — Progress Notes (Signed)
[place]  Telephone:(336) [phone] Fax:(336) [phone]  ID: FIA HEBERT OB: 06/09/1955  MR#: [id number]  JKD#:[id number]  Patient Care Team: Birdie Sons, MD as PCP - General (Family Medicine) Dionisio David, MD (Cardiology)  CHIEF COMPLAINT: Clinical stage IIIB small cell lung cancer of the right lung.  INTERVAL HISTORY: Patient is a 61 year old female who was initially staged and diagnosed at [facility], but then received her treatment at an outside facility closer to her home. She has completed 5 cycles of carboplatinum and etoposide as has XRT. She returns to clinic today for continuation of treatment and further evaluation. She has been tolerating her treatments well. She is highly anxious, but otherwise feels well. She has a chronic cough that is unchanged. She has no neurologic complaints. She denies any recent fevers or illnesses. She has a good appetite and denies weight loss. She has no chest pain, shortness of breath, or hemoptysis. She denies any nausea, vomiting, constipation, or diarrhea. She has no urinary complaints. Patient offers no further specific complaints.  REVIEW OF SYSTEMS:   Review of Systems  Constitutional: Negative.  Negative for fever, malaise/fatigue and weight loss.  Respiratory: Positive for cough. Negative for hemoptysis and shortness of breath.   Cardiovascular: Negative.  Negative for chest pain and leg swelling.  Gastrointestinal: Negative.  Negative for abdominal pain.  Genitourinary: Negative.   Musculoskeletal: Negative.   Skin: Negative.  Negative for rash.  Neurological: Negative.  Negative for sensory change and weakness.  Psychiatric/Behavioral: The patient is nervous/anxious.     As per HPI. Otherwise, a complete review of systems is negative.  PAST MEDICAL HISTORY: Past Medical History:  Diagnosis Date  . Anemia   . Asthma   . C. difficile diarrhea 04/07/2016  . Cancer ([place])    lung ca  . CAP (community acquired  pneumonia) 03/21/2015  . COPD (chronic obstructive pulmonary disease) ([place])   . Diabetes mellitus without complication ([place])   . Displacement of lumbar intervertebral disc without myelopathy   . Emphysema of lung ([place])   . History of chicken pox   . Pneumonia 03/29/2015  . Shortness of breath     PAST SURGICAL HISTORY: Past Surgical History:  Procedure Laterality Date  . CESAREAN SECTION    . TUBAL LIGATION      FAMILY HISTORY: Family History  Problem Relation Age of Onset  . Heart failure Mother   . Heart disease Mother   . Stroke Mother   . Heart disease Brother   . COPD Brother   . Cancer Maternal Aunt        Breast Cancer    ADVANCED DIRECTIVES (Y/N):  N  HEALTH MAINTENANCE: Social History  Substance Use Topics  . Smoking status: Former Smoker    Packs/day: 0.25    Years: 45.00    Types: Cigarettes    Quit date: 03/17/2016  . Smokeless tobacco: Never Used     Comment: previously smoked 2 1/2 ppd for 30 years.   . Alcohol use No     Colonoscopy:  PAP:  Bone density:  Lipid panel:  Allergies  Allergen Reactions  . Codeine Anaphylaxis  . Crestor [Rosuvastatin] Anaphylaxis  . Other Anaphylaxis  . Penicillins Anaphylaxis, Rash and Other (See Comments)    Reaction:  Tongue swelling  Has patient had a PCN reaction causing immediate rash, facial/tongue/throat swelling, SOB or lightheadedness with hypotension:  Yes   Has patient had a PCN reaction causing severe rash involving mucus membranes or  skin necrosis: No Has patient had a PCN reaction that required hospitalization No Has patient had a PCN reaction occurring within the last 10 years: No If all of the above answers are "NO", then may proceed with Cephalosporin use.  . Yellow Jacket Venom [Bee Venom] Anaphylaxis  . Gemfibrozil Rash, Nausea And Vomiting and Swelling  . Statins Nausea And Vomiting and Swelling  . Trazodone And Nefazodone Nausea And Vomiting  . Lipitor [Atorvastatin] Rash    Current  Outpatient Prescriptions  Medication Sig Dispense Refill  . ALPRAZolam (XANAX) 0.25 MG tablet Take 1 tablet (0.25 mg total) by mouth 3 (three) times daily as needed for anxiety. 60 tablet 5  . aspirin EC 81 MG tablet Take 81 mg by mouth daily.     . ferrous sulfate 325 (65 FE) MG tablet Take 1 tablet (325 mg total) by mouth 2 (two) times daily with a meal. 30 tablet 3  . glucose blood test strip Check blood sugar daily. Dx Type 2 diabetes E11.9 100 each 3  . ipratropium-albuterol (DUONEB) 0.5-2.5 (3) MG/3ML SOLN Take 3 mLs by nebulization every 6 (six) hours as needed.    . meclizine (ANTIVERT) 25 MG tablet TAKE ONE TABLET BY MOUTH THREE TIMES DAILY AS NEEDED FOR DIZZINESS 30 tablet 5  . meloxicam (MOBIC) 15 MG tablet Take 1 tablet (15 mg total) by mouth daily as needed for pain. 90 tablet 2  . metFORMIN (GLUCOPHAGE XR) 500 MG 24 hr tablet Take 2 tablets (1,000 mg total) by mouth daily with breakfast. With meals 1 tablet 1  . mirtazapine (REMERON) 15 MG tablet Take 1 tablet (15 mg total) by mouth at bedtime. 30 tablet 3  . montelukast (SINGULAIR) 10 MG tablet Take 10 mg by mouth at bedtime.    . Nutritional Supplements (GLUCERNA ADVANCE SHAKE) LIQD Drink 1-2 shakes per day 24 Bottle 12  . oxyCODONE-acetaminophen (PERCOCET) 10-325 MG tablet One tablet every 4-6 hours as needed 180 tablet 0  . umeclidinium-vilanterol (ANORO ELLIPTA) 62.5-25 MCG/INH AEPB Inhale 1 puff into the lungs daily. 60 each 4  . zolpidem (AMBIEN) 10 MG tablet Take 1 tablet (10 mg total) by mouth at bedtime. 30 tablet 5  . clonazePAM (KLONOPIN) 0.5 MG tablet Take 1 tablet (0.5 mg total) by mouth 2 (two) times daily as needed for anxiety. 60 tablet 3  . levalbuterol (XOPENEX HFA) 45 MCG/ACT inhaler INHALE 1 PUFF BY MOUTH EVERY 4 HOURS AS NEEDED 15 g 5  . prochlorperazine (COMPAZINE) 10 MG tablet Take 1 tablet by mouth every 6 (six) hours as needed.     No current facility-administered medications for this visit.      OBJECTIVE: Vitals:   08/13/16 0928  BP: 121/82  Pulse: (!) 103  Resp: 20  Temp: (!) 97.1 F (36.2 C)     Body mass index is 32.52 kg/m.    ECOG FS:0 - Asymptomatic  General: Well-developed, well-nourished, no acute distress. Eyes: Pink conjunctiva, anicteric sclera. HEENT: Normocephalic, moist mucous membranes, clear oropharnyx. Lungs: Clear to auscultation bilaterally. Heart: Regular rate and rhythm. No rubs, murmurs, or gallops. Abdomen: Soft, nontender, nondistended. No organomegaly noted, normoactive bowel sounds. Musculoskeletal: No edema, cyanosis, or clubbing. Neuro: Alert, answering all questions appropriately. Cranial nerves grossly intact. Skin: No rashes or petechiae noted. Psych: Normal affect. Lymphatics: No cervical, calvicular, axillary or inguinal LAD.   LAB RESULTS:  Lab Results  Component Value Date   NA 139 08/13/2016   K 3.8 08/13/2016   CL 106 08/13/2016  CO2 27 08/13/2016   GLUCOSE 165 (H) 08/13/2016   BUN 10 08/13/2016   CREATININE 0.61 08/13/2016   CALCIUM 9.2 08/13/2016   PROT 7.4 08/13/2016   ALBUMIN 4.2 08/13/2016   AST 23 08/13/2016   ALT 11 (L) 08/13/2016   ALKPHOS 76 08/13/2016   BILITOT 0.8 08/13/2016   GFRNONAA >60 08/13/2016   GFRAA >60 08/13/2016    Lab Results  Component Value Date   WBC 8.6 08/13/2016   NEUTROABS 6.9 (H) 08/13/2016   HGB 10.1 (L) 08/13/2016   HCT 28.9 (L) 08/13/2016   MCV 91.1 08/13/2016   PLT 341 08/13/2016     STUDIES: Dg Chest 2 View  Result Date: 08/11/2016 CLINICAL DATA:  61 year old female with history of shortness of breath. Chest tightness. Cough. History of lung cancer. EXAM: CHEST  2 VIEW COMPARISON:  Chest x-ray 05/13/2016. FINDINGS: There continues to be some fullness in the perihilar aspect of the right upper lobe at the site of the treated mass. Patchy areas of interstitial prominence are noted throughout the adjacent portions of the right lung involving right middle lobe, right upper  lobe and superior segment of the right lower lobe, likely to reflect evolving postradiation changes. Left lung is clear. No pleural effusions. No pneumothorax. No evidence of pulmonary edema. Heart size is normal. IMPRESSION: 1. Probable postradiation changes developing in the right lung, given the time course, likely to reflect mild postradiation pneumonitis. Electronically Signed   By: Vinnie Langton M.D.   On: 08/11/2016 22:42    ASSESSMENT: Clinical stage IIIB small cell lung cancer of the right lung.  PLAN:    1. Clinical stage IIIB small cell lung cancer of the right lung: Patient completed concurrent chemotherapy and XRT at outside facility and now transferring care to complete her treatment and monitoring of follow-up imaging. Proceed with cycle 6 of 6 of carboplatinum and today. Patient will return to clinic in 1 and 2 days for etoposide only and then in 3 days for Neulasta. Return to clinic in 6 weeks with repeat imaging with PET scan and further evaluation. A referral was made to radiation oncology at that time for consideration of PCI.  2. Anemia: Mild, likely secondary to chemotherapy. Monitor.  Approximately 30 minutes was spent in discussion of which greater than 50% was consultation.  Patient expressed understanding and was in agreement with this plan. She also understands that She can call clinic at any time with any questions, concerns, or complaints.   Cancer Staging Small cell lung cancer, right [facility]) Staging form: Lung, AJCC 8th Edition - Clinical stage from 08/18/2016: Stage IIIB (cT2a, cN3, cM0) - Signed by Lloyd Huger, MD on 08/18/2016   Lloyd Huger, MD   08/18/2016 8:50 AM

## 2016-08-11 NOTE — ED Triage Notes (Signed)
Patient states that she is a lung cancer patient. Patient with complaint of shortness of breath that started this morning. Patient states that the shortness of breath has become worse throughout the day. Patient unable to speak in complete sentences at stat desk.

## 2016-08-12 ENCOUNTER — Telehealth: Payer: Self-pay | Admitting: Family Medicine

## 2016-08-12 DIAGNOSIS — F419 Anxiety disorder, unspecified: Secondary | ICD-10-CM

## 2016-08-12 MED ORDER — ALPRAZOLAM 0.25 MG PO TABS: 0.2500 mg | ORAL_TABLET | Freq: Three times a day (TID) | ORAL | 5 refills | Status: DC | PRN

## 2016-08-12 NOTE — Telephone Encounter (Signed)
Please advise 

## 2016-08-12 NOTE — Telephone Encounter (Signed)
Pt states [facility] has not rec'd the Rx refill for ALPRAZolam (XANAX) 0.25 MG tablet. Pt is requesting this resent.  YQ#[phone]/LO

## 2016-08-12 NOTE — Telephone Encounter (Signed)
Please call in alprazolam.  

## 2016-08-12 NOTE — Telephone Encounter (Signed)
Med called in. Pt advised.

## 2016-08-13 ENCOUNTER — Inpatient Hospital Stay: Payer: Medicare Other

## 2016-08-13 ENCOUNTER — Encounter: Payer: Self-pay | Admitting: *Deleted

## 2016-08-13 ENCOUNTER — Telehealth: Payer: Self-pay | Admitting: Family Medicine

## 2016-08-13 ENCOUNTER — Inpatient Hospital Stay: Payer: Medicare Other | Attending: Oncology

## 2016-08-13 ENCOUNTER — Inpatient Hospital Stay (HOSPITAL_BASED_OUTPATIENT_CLINIC_OR_DEPARTMENT_OTHER): Payer: Medicare Other | Admitting: Oncology

## 2016-08-13 VITALS — BP 121/82 | HR 103 | Temp 97.1°F | Resp 20 | Wt 161.0 lb

## 2016-08-13 DIAGNOSIS — J449 Chronic obstructive pulmonary disease, unspecified: Secondary | ICD-10-CM

## 2016-08-13 DIAGNOSIS — Z7984 Long term (current) use of oral hypoglycemic drugs: Secondary | ICD-10-CM | POA: Insufficient documentation

## 2016-08-13 DIAGNOSIS — Z803 Family history of malignant neoplasm of breast: Secondary | ICD-10-CM | POA: Insufficient documentation

## 2016-08-13 DIAGNOSIS — E119 Type 2 diabetes mellitus without complications: Secondary | ICD-10-CM | POA: Diagnosis not present

## 2016-08-13 DIAGNOSIS — Z880 Allergy status to penicillin: Secondary | ICD-10-CM

## 2016-08-13 DIAGNOSIS — Z923 Personal history of irradiation: Secondary | ICD-10-CM

## 2016-08-13 DIAGNOSIS — C3491 Malignant neoplasm of unspecified part of right bronchus or lung: Secondary | ICD-10-CM | POA: Diagnosis not present

## 2016-08-13 DIAGNOSIS — Z79899 Other long term (current) drug therapy: Secondary | ICD-10-CM | POA: Insufficient documentation

## 2016-08-13 DIAGNOSIS — Z87891 Personal history of nicotine dependence: Secondary | ICD-10-CM | POA: Insufficient documentation

## 2016-08-13 DIAGNOSIS — Z7982 Long term (current) use of aspirin: Secondary | ICD-10-CM

## 2016-08-13 DIAGNOSIS — Z5111 Encounter for antineoplastic chemotherapy: Secondary | ICD-10-CM | POA: Insufficient documentation

## 2016-08-13 DIAGNOSIS — Z7689 Persons encountering health services in other specified circumstances: Secondary | ICD-10-CM | POA: Diagnosis not present

## 2016-08-13 DIAGNOSIS — D649 Anemia, unspecified: Secondary | ICD-10-CM | POA: Insufficient documentation

## 2016-08-13 DIAGNOSIS — Z9221 Personal history of antineoplastic chemotherapy: Secondary | ICD-10-CM | POA: Diagnosis not present

## 2016-08-13 LAB — CBC WITH DIFFERENTIAL/PLATELET
BASOS ABS: 0 10*3/uL (ref 0–0.1)
Basophils Relative: 0 %
EOS ABS: 0.1 10*3/uL (ref 0–0.7)
Eosinophils Relative: 1 %
HCT: 28.9 % — ABNORMAL LOW (ref 35.0–47.0)
HEMOGLOBIN: 10.1 g/dL — AB (ref 12.0–16.0)
LYMPHS ABS: 1.1 10*3/uL (ref 1.0–3.6)
Lymphocytes Relative: 12 %
MCH: 31.8 pg (ref 26.0–34.0)
MCHC: 34.9 g/dL (ref 32.0–36.0)
MCV: 91.1 fL (ref 80.0–100.0)
Monocytes Absolute: 0.5 10*3/uL (ref 0.2–0.9)
Monocytes Relative: 6 %
NEUTROS PCT: 81 %
Neutro Abs: 6.9 10*3/uL — ABNORMAL HIGH (ref 1.4–6.5)
Platelets: 341 10*3/uL (ref 150–440)
RBC: 3.17 MIL/uL — AB (ref 3.80–5.20)
RDW: 21.9 % — ABNORMAL HIGH (ref 11.5–14.5)
WBC: 8.6 10*3/uL (ref 3.6–11.0)

## 2016-08-13 LAB — COMPREHENSIVE METABOLIC PANEL
ALK PHOS: 76 U/L (ref 38–126)
ALT: 11 U/L — AB (ref 14–54)
AST: 23 U/L (ref 15–41)
Albumin: 4.2 g/dL (ref 3.5–5.0)
Anion gap: 6 (ref 5–15)
BUN: 10 mg/dL (ref 6–20)
CALCIUM: 9.2 mg/dL (ref 8.9–10.3)
CO2: 27 mmol/L (ref 22–32)
CREATININE: 0.61 mg/dL (ref 0.44–1.00)
Chloride: 106 mmol/L (ref 101–111)
GFR calc non Af Amer: >60 mL/min (ref >60)
Glucose, Bld: 165 mg/dL — ABNORMAL HIGH (ref 65–99)
Potassium: 3.8 mmol/L (ref 3.5–5.1)
SODIUM: 139 mmol/L (ref 135–145)
Total Bilirubin: 0.8 mg/dL (ref 0.3–1.2)
Total Protein: 7.4 g/dL (ref 6.5–8.1)

## 2016-08-13 MED ORDER — SODIUM CHLORIDE 0.9 % IV SOLN
Freq: Once | INTRAVENOUS | Status: AC
  Administered 2016-08-13: 11:00:00 via INTRAVENOUS
  Filled 2016-08-13: qty 1000

## 2016-08-13 MED ORDER — HEPARIN SOD (PORK) LOCK FLUSH 100 UNIT/ML IV SOLN: 500.0000 [IU] | Freq: Once | INTRAVENOUS | Status: DC | PRN

## 2016-08-13 MED ORDER — DEXAMETHASONE SODIUM PHOSPHATE 10 MG/ML IJ SOLN
10.0000 mg | Freq: Once | INTRAMUSCULAR | Status: AC
  Administered 2016-08-13: 10 mg via INTRAVENOUS
  Filled 2016-08-13: qty 1

## 2016-08-13 MED ORDER — ETOPOSIDE CHEMO INJECTION 1 GM/50ML
100.0000 mg/m2 | Freq: Once | INTRAVENOUS | Status: AC
  Administered 2016-08-13: 180 mg via INTRAVENOUS
  Filled 2016-08-13: qty 9

## 2016-08-13 MED ORDER — PALONOSETRON HCL INJECTION 0.25 MG/5ML
0.2500 mg | Freq: Once | INTRAVENOUS | Status: AC
  Administered 2016-08-13: 0.25 mg via INTRAVENOUS
  Filled 2016-08-13: qty 5

## 2016-08-13 MED ORDER — SODIUM CHLORIDE 0.9 % IV SOLN
560.0000 mg | Freq: Once | INTRAVENOUS | Status: AC
  Administered 2016-08-13: 560 mg via INTRAVENOUS
  Filled 2016-08-13: qty 56

## 2016-08-13 MED ORDER — DEXAMETHASONE SODIUM PHOSPHATE 100 MG/10ML IJ SOLN: 10.0000 mg | Freq: Once | INTRAMUSCULAR | Status: DC

## 2016-08-13 MED ORDER — SODIUM CHLORIDE 0.9 % IV SOLN: 589.0000 mg | Freq: Once | INTRAVENOUS | Status: DC

## 2016-08-13 NOTE — Progress Notes (Signed)
  Oncology Nurse Navigator Documentation  Navigator Location: [facility] (08/13/16 1000)   )Navigator Encounter Type: Treatment (08/13/16 1000)                       Treatment Phase: Final Chemo TX (08/13/16 1000) Barriers/Navigation Needs: No barriers at this time (08/13/16 1000)   Interventions: Coordination of Care (08/13/16 1000)   Coordination of Care: Appts (08/13/16 1000)        Acuity: Level 1 (08/13/16 1000) Acuity Level 1: Initial guidance, education and coordination as needed (08/13/16 1000)    Pt referred back to [facility] since recently moved back to the [place] area from [place]. Pt has completed radiation and 5 cycles chemo in [place]. Met with patient prior to final cycle chemo. Reviewed future appts with patient. Contact info given and instructed pt to call with any questions. Pt verbalized understanding.      Time Spent with Patient: 60 (08/13/16 1000)

## 2016-08-13 NOTE — Telephone Encounter (Signed)
Pt called saying her insurance will not pay for the Alprazolam but will pay for Clonazepam tab 0.25   [facility]  Pt' call back [phone]

## 2016-08-13 NOTE — Telephone Encounter (Signed)
Please advise 

## 2016-08-13 NOTE — Progress Notes (Signed)
Patient here today for follow up and treatment consideration regarding lung cancer, patient transferring care back from [place]. Patient denies any concerns today.

## 2016-08-14 ENCOUNTER — Inpatient Hospital Stay: Payer: Medicare Other

## 2016-08-14 ENCOUNTER — Emergency Department
Admission: EM | Admit: 2016-08-14 | Discharge: 2016-08-14 | Disposition: A | Discharge status: Home / Self Care | Payer: Medicare Other

## 2016-08-14 VITALS — BP 134/85 | HR 91 | Temp 98.7°F | Resp 20

## 2016-08-14 DIAGNOSIS — C3491 Malignant neoplasm of unspecified part of right bronchus or lung: Secondary | ICD-10-CM | POA: Diagnosis not present

## 2016-08-14 DIAGNOSIS — D649 Anemia, unspecified: Secondary | ICD-10-CM | POA: Diagnosis not present

## 2016-08-14 DIAGNOSIS — E119 Type 2 diabetes mellitus without complications: Secondary | ICD-10-CM | POA: Diagnosis not present

## 2016-08-14 DIAGNOSIS — Z7689 Persons encountering health services in other specified circumstances: Secondary | ICD-10-CM | POA: Diagnosis not present

## 2016-08-14 DIAGNOSIS — J449 Chronic obstructive pulmonary disease, unspecified: Secondary | ICD-10-CM | POA: Diagnosis not present

## 2016-08-14 DIAGNOSIS — Z5111 Encounter for antineoplastic chemotherapy: Secondary | ICD-10-CM | POA: Diagnosis not present

## 2016-08-14 MED ORDER — DEXAMETHASONE SODIUM PHOSPHATE 10 MG/ML IJ SOLN
10.0000 mg | Freq: Once | INTRAMUSCULAR | Status: AC
  Administered 2016-08-14: 10 mg via INTRAVENOUS

## 2016-08-14 MED ORDER — DEXAMETHASONE SODIUM PHOSPHATE 10 MG/ML IJ SOLN
INTRAMUSCULAR | Status: AC
  Filled 2016-08-14: qty 1

## 2016-08-14 MED ORDER — SODIUM CHLORIDE 0.9 % IV SOLN
100.0000 mg/m2 | Freq: Once | INTRAVENOUS | Status: AC
  Administered 2016-08-14: 180 mg via INTRAVENOUS
  Filled 2016-08-14: qty 9

## 2016-08-14 MED ORDER — SODIUM CHLORIDE 0.9 % IV SOLN
Freq: Once | INTRAVENOUS | Status: AC
  Administered 2016-08-14: 14:00:00 via INTRAVENOUS
  Filled 2016-08-14: qty 1000

## 2016-08-14 MED ORDER — CLONAZEPAM 0.5 MG PO TABS: 0.5000 mg | ORAL_TABLET | Freq: Two times a day (BID) | ORAL | 3 refills | Status: DC | PRN

## 2016-08-14 NOTE — Telephone Encounter (Signed)
Please call pharmacy, cancel alprazolam prescription and instead fill clonazepam 0.5mg  one tablet twice a day as needed, #60, rf x 3.

## 2016-08-14 NOTE — Telephone Encounter (Signed)
Patient notified. Rx called into pharmacy.

## 2016-08-14 NOTE — ED Notes (Signed)
First nurse note  states she is requesting her saline lock be removed  Was placed today at Cancer center and then she developed pain after going home   24 g angio cath removed w/o diff  Sire clear

## 2016-08-15 ENCOUNTER — Telehealth: Payer: Self-pay

## 2016-08-15 ENCOUNTER — Inpatient Hospital Stay: Payer: Medicare Other | Admitting: Oncology

## 2016-08-15 ENCOUNTER — Encounter: Payer: Self-pay | Admitting: Internal Medicine

## 2016-08-15 ENCOUNTER — Inpatient Hospital Stay: Payer: Medicare Other

## 2016-08-15 VITALS — BP 147/79 | HR 80 | Temp 98.0°F | Resp 20

## 2016-08-15 DIAGNOSIS — E119 Type 2 diabetes mellitus without complications: Secondary | ICD-10-CM | POA: Diagnosis not present

## 2016-08-15 DIAGNOSIS — C3491 Malignant neoplasm of unspecified part of right bronchus or lung: Secondary | ICD-10-CM

## 2016-08-15 DIAGNOSIS — Z7689 Persons encountering health services in other specified circumstances: Secondary | ICD-10-CM | POA: Diagnosis not present

## 2016-08-15 DIAGNOSIS — Z5111 Encounter for antineoplastic chemotherapy: Secondary | ICD-10-CM | POA: Diagnosis not present

## 2016-08-15 DIAGNOSIS — J449 Chronic obstructive pulmonary disease, unspecified: Secondary | ICD-10-CM | POA: Diagnosis not present

## 2016-08-15 DIAGNOSIS — D649 Anemia, unspecified: Secondary | ICD-10-CM | POA: Diagnosis not present

## 2016-08-15 MED ORDER — SODIUM CHLORIDE 0.9 % IV SOLN
Freq: Once | INTRAVENOUS | Status: AC
  Administered 2016-08-15: 11:00:00 via INTRAVENOUS
  Filled 2016-08-15: qty 1000

## 2016-08-15 MED ORDER — DEXAMETHASONE SODIUM PHOSPHATE 10 MG/ML IJ SOLN
10.0000 mg | Freq: Once | INTRAMUSCULAR | Status: AC
  Administered 2016-08-15: 10 mg via INTRAVENOUS

## 2016-08-15 MED ORDER — SODIUM CHLORIDE 0.9 % IV SOLN
100.0000 mg/m2 | Freq: Once | INTRAVENOUS | Status: AC
  Administered 2016-08-15: 180 mg via INTRAVENOUS
  Filled 2016-08-15: qty 9

## 2016-08-15 MED ORDER — PEGFILGRASTIM 6 MG/0.6ML ~~LOC~~ PSKT: 6.0000 mg | PREFILLED_SYRINGE | Freq: Once | SUBCUTANEOUS | Status: DC

## 2016-08-15 NOTE — Telephone Encounter (Signed)
Patient is requesting a refill on Xopenex sent to [facility]. CB# [phone]

## 2016-08-15 NOTE — Progress Notes (Signed)
This encounter was created in error - please disregard.

## 2016-08-15 NOTE — Telephone Encounter (Signed)
Please review

## 2016-08-16 ENCOUNTER — Inpatient Hospital Stay: Payer: Medicare Other

## 2016-08-16 DIAGNOSIS — D649 Anemia, unspecified: Secondary | ICD-10-CM | POA: Diagnosis not present

## 2016-08-16 DIAGNOSIS — J449 Chronic obstructive pulmonary disease, unspecified: Secondary | ICD-10-CM | POA: Diagnosis not present

## 2016-08-16 DIAGNOSIS — C801 Malignant (primary) neoplasm, unspecified: Secondary | ICD-10-CM

## 2016-08-16 DIAGNOSIS — Z7689 Persons encountering health services in other specified circumstances: Secondary | ICD-10-CM | POA: Diagnosis not present

## 2016-08-16 DIAGNOSIS — C3491 Malignant neoplasm of unspecified part of right bronchus or lung: Secondary | ICD-10-CM | POA: Diagnosis not present

## 2016-08-16 DIAGNOSIS — E119 Type 2 diabetes mellitus without complications: Secondary | ICD-10-CM | POA: Diagnosis not present

## 2016-08-16 DIAGNOSIS — Z5111 Encounter for antineoplastic chemotherapy: Secondary | ICD-10-CM | POA: Diagnosis not present

## 2016-08-16 MED ORDER — PEGFILGRASTIM INJECTION 6 MG/0.6ML ~~LOC~~
6.0000 mg | PREFILLED_SYRINGE | Freq: Once | SUBCUTANEOUS | Status: AC
  Administered 2016-08-16: 6 mg via SUBCUTANEOUS
  Filled 2016-08-16: qty 0.6

## 2016-08-16 MED ORDER — LEVALBUTEROL TARTRATE 45 MCG/ACT IN AERO: INHALATION_SPRAY | RESPIRATORY_TRACT | 5 refills | Status: DC

## 2016-08-23 ENCOUNTER — Other Ambulatory Visit: Payer: Self-pay | Admitting: Family Medicine

## 2016-08-23 MED ORDER — ALBUTEROL SULFATE HFA 108 (90 BASE) MCG/ACT IN AERS: 2.0000 | INHALATION_SPRAY | Freq: Four times a day (QID) | RESPIRATORY_TRACT | 12 refills | Status: DC | PRN

## 2016-08-23 NOTE — Progress Notes (Signed)
Change xopenex to albuterol due to formulary. Patient was called by CMA and agreed to try change in inhaler.

## 2016-09-11 ENCOUNTER — Telehealth: Payer: Self-pay | Admitting: Family Medicine

## 2016-09-11 MED ORDER — SUCRALFATE 1 G PO TABS: 1.0000 g | ORAL_TABLET | Freq: Three times a day (TID) | ORAL | 4 refills | Status: DC

## 2016-09-11 NOTE — Telephone Encounter (Signed)
Pt called wanting to know if Dr. Caryn Section will prescribe sucralfate 1mg  tablets.  She use to get this from her onocology doctor.[address]  [place]

## 2016-09-11 NOTE — Telephone Encounter (Signed)
Patient advised.

## 2016-09-11 NOTE — Telephone Encounter (Signed)
h prescription to [facility]

## 2016-09-24 ENCOUNTER — Ambulatory Visit
Admission: RE | Admit: 2016-09-24 | Discharge: 2016-09-24 | Disposition: A | Discharge status: Home / Self Care | Payer: Medicare Other | Source: Ambulatory Visit | Attending: Oncology | Admitting: Oncology

## 2016-09-24 ENCOUNTER — Telehealth: Payer: Self-pay

## 2016-09-24 DIAGNOSIS — I313 Pericardial effusion (noninflammatory): Secondary | ICD-10-CM | POA: Diagnosis not present

## 2016-09-24 DIAGNOSIS — J700 Acute pulmonary manifestations due to radiation: Secondary | ICD-10-CM | POA: Insufficient documentation

## 2016-09-24 DIAGNOSIS — I700 Atherosclerosis of aorta: Secondary | ICD-10-CM | POA: Diagnosis not present

## 2016-09-24 DIAGNOSIS — C3491 Malignant neoplasm of unspecified part of right bronchus or lung: Secondary | ICD-10-CM | POA: Diagnosis not present

## 2016-09-24 DIAGNOSIS — I2510 Atherosclerotic heart disease of native coronary artery without angina pectoris: Secondary | ICD-10-CM | POA: Diagnosis not present

## 2016-09-24 LAB — GLUCOSE, CAPILLARY: Glucose-Capillary: 134 mg/dL — ABNORMAL HIGH (ref 65–99)

## 2016-09-24 MED ORDER — FLUDEOXYGLUCOSE F - 18 (FDG) INJECTION
12.9700 | Freq: Once | INTRAVENOUS | Status: AC | PRN
  Administered 2016-09-24: 12.97 via INTRAVENOUS

## 2016-09-24 MED ORDER — AZITHROMYCIN 250 MG PO TABS: ORAL_TABLET | ORAL | 0 refills | Status: AC

## 2016-09-24 NOTE — Progress Notes (Signed)
[place]  Telephone:(336) [phone] Fax:(336) [phone]  ID: Yvette Riley OB: [birth year]  MR#: [id number]  VQQ#:[id number]  Patient Care Team: Birdie Sons, MD as PCP - General (Family Medicine) Dionisio David, MD (Cardiology)  CHIEF COMPLAINT: Clinical stage IIIB small cell lung cancer of the right lung.  INTERVAL HISTORY: Patient returns to clinic today for further evaluation and discussion of her imaging results. She continues to be anxious, but otherwise feels well. She has a chronic cough that is unchanged. She has no neurologic complaints. She denies any recent fevers or illnesses. She has a good appetite and denies weight loss. She has no chest pain, shortness of breath, or hemoptysis. She denies any nausea, vomiting, constipation, or diarrhea. She has no urinary complaints. Patient offers no further specific complaints.  REVIEW OF SYSTEMS:   Review of Systems  Constitutional: Negative.  Negative for fever, malaise/fatigue and weight loss.  Respiratory: Positive for cough. Negative for hemoptysis and shortness of breath.   Cardiovascular: Negative.  Negative for chest pain and leg swelling.  Gastrointestinal: Negative.  Negative for abdominal pain.  Genitourinary: Negative.   Musculoskeletal: Negative.   Skin: Negative.  Negative for rash.  Neurological: Negative.  Negative for sensory change and weakness.  Psychiatric/Behavioral: The patient is nervous/anxious.     As per HPI. Otherwise, a complete review of systems is negative.  PAST MEDICAL HISTORY: Past Medical History:  Diagnosis Date  . Anemia   . Asthma   . C. difficile diarrhea 04/07/2016  . Cancer ([place])    lung ca  . CAP (community acquired pneumonia) 03/21/2015  . COPD (chronic obstructive pulmonary disease) ([place])   . Diabetes mellitus without complication ([place])   . Displacement of lumbar intervertebral disc without myelopathy   . Emphysema of lung ([place])   . History of chicken pox     . Pneumonia 03/29/2015  . Shortness of breath     PAST SURGICAL HISTORY: Past Surgical History:  Procedure Laterality Date  . CESAREAN SECTION    . TUBAL LIGATION      FAMILY HISTORY: Family History  Problem Relation Age of Onset  . Heart failure Mother   . Heart disease Mother   . Stroke Mother   . Heart disease Brother   . COPD Brother   . Cancer Maternal Aunt        Breast Cancer    ADVANCED DIRECTIVES (Y/N):  N  HEALTH MAINTENANCE: Social History  Substance Use Topics  . Smoking status: Former Smoker    Packs/day: 0.25    Years: 45.00    Types: Cigarettes    Quit date: 03/17/2016  . Smokeless tobacco: Never Used     Comment: previously smoked 2 1/2 ppd for 30 years.   . Alcohol use No     Colonoscopy:  PAP:  Bone density:  Lipid panel:  Allergies  Allergen Reactions  . Codeine Anaphylaxis  . Crestor [Rosuvastatin] Anaphylaxis  . Other Anaphylaxis  . Penicillins Anaphylaxis, Rash and Other (See Comments)    Reaction:  Tongue swelling  Has patient had a PCN reaction causing immediate rash, facial/tongue/throat swelling, SOB or lightheadedness with hypotension:  Yes   Has patient had a PCN reaction causing severe rash involving mucus membranes or skin necrosis: No Has patient had a PCN reaction that required hospitalization No Has patient had a PCN reaction occurring within the last 10 years: No If all of the above answers are "NO", then may proceed with Cephalosporin use.  [name]  Yellow Jacket Venom [Bee Venom] Anaphylaxis  . Gemfibrozil Rash, Nausea And Vomiting and Swelling  . Statins Nausea And Vomiting and Swelling  . Trazodone And Nefazodone Nausea And Vomiting  . Lipitor [Atorvastatin] Rash    Current Outpatient Prescriptions  Medication Sig Dispense Refill  . albuterol (PROVENTIL HFA;VENTOLIN HFA) 108 (90 Base) MCG/ACT inhaler Inhale 2 puffs into the lungs every 6 (six) hours as needed for wheezing or shortness of breath. 1 Inhaler 12  . ALPRAZolam  (XANAX) 0.25 MG tablet Take 1 tablet (0.25 mg total) by mouth 3 (three) times daily as needed for anxiety. 60 tablet 5  . aspirin EC 81 MG tablet Take 81 mg by mouth daily.     [name] azithromycin (ZITHROMAX) 250 MG tablet 2 by mouth today, then 1 daily for 4 days 6 tablet 0  . clonazePAM (KLONOPIN) 0.5 MG tablet Take 1 tablet (0.5 mg total) by mouth 2 (two) times daily as needed for anxiety. 60 tablet 3  . ferrous sulfate 325 (65 FE) MG tablet Take 1 tablet (325 mg total) by mouth 2 (two) times daily with a meal. 30 tablet 3  . glucose blood test strip Check blood sugar daily. Dx Type 2 diabetes E11.9 100 each 3  . ipratropium-albuterol (DUONEB) 0.5-2.5 (3) MG/3ML SOLN Take 3 mLs by nebulization every 6 (six) hours as needed.    . meclizine (ANTIVERT) 25 MG tablet TAKE ONE TABLET BY MOUTH THREE TIMES DAILY AS NEEDED FOR DIZZINESS 30 tablet 5  . meloxicam (MOBIC) 15 MG tablet Take 1 tablet (15 mg total) by mouth daily as needed for pain. 90 tablet 2  . metFORMIN (GLUCOPHAGE XR) 500 MG 24 hr tablet Take 2 tablets (1,000 mg total) by mouth daily with breakfast. With meals 1 tablet 1  . mirtazapine (REMERON) 15 MG tablet Take 1 tablet (15 mg total) by mouth at bedtime. 30 tablet 3  . montelukast (SINGULAIR) 10 MG tablet Take 10 mg by mouth at bedtime.    . Nutritional Supplements (GLUCERNA ADVANCE SHAKE) LIQD Drink 1-2 shakes per day 24 Bottle 12  . oxyCODONE-acetaminophen (PERCOCET) 10-325 MG tablet One tablet every 4-6 hours as needed 180 tablet 0  . prochlorperazine (COMPAZINE) 10 MG tablet Take 1 tablet by mouth every 6 (six) hours as needed.    . sucralfate (CARAFATE) 1 g tablet Take 1 tablet (1 g total) by mouth 4 (four) times daily -  with meals and at bedtime. 120 tablet 4  . umeclidinium-vilanterol (ANORO ELLIPTA) 62.5-25 MCG/INH AEPB Inhale 1 puff into the lungs daily. 60 each 4  . zolpidem (AMBIEN) 10 MG tablet Take 1 tablet (10 mg total) by mouth at bedtime. 30 tablet 5  . predniSONE (STERAPRED  UNI-PAK 21 TAB) 10 MG (21) TBPK tablet Taper as directed 21 tablet 0   No current facility-administered medications for this visit.     OBJECTIVE: Vitals:   09/26/16 1101  BP: 136/84  Pulse: (!) 109  Resp: 20  Temp: 97.6 F (36.4 C)  SpO2: 97%     Body mass index is 31.59 kg/m.    ECOG FS:0 - Asymptomatic  General: Well-developed, well-nourished, no acute distress. Eyes: Pink conjunctiva, anicteric sclera. Lungs: Clear to auscultation bilaterally. Heart: Regular rate and rhythm. No rubs, murmurs, or gallops. Abdomen: Soft, nontender, nondistended. No organomegaly noted, normoactive bowel sounds. Musculoskeletal: No edema, cyanosis, or clubbing. Neuro: Alert, answering all questions appropriately. Cranial nerves grossly intact. Skin: No rashes or petechiae noted. Psych: Normal affect.   LAB  RESULTS:  Lab Results  Component Value Date   NA 139 08/13/2016   K 3.8 08/13/2016   CL 106 08/13/2016   CO2 27 08/13/2016   GLUCOSE 165 (H) 08/13/2016   BUN 10 08/13/2016   CREATININE 0.61 08/13/2016   CALCIUM 9.2 08/13/2016   PROT 7.4 08/13/2016   ALBUMIN 4.2 08/13/2016   AST 23 08/13/2016   ALT 11 (L) 08/13/2016   ALKPHOS 76 08/13/2016   BILITOT 0.8 08/13/2016   GFRNONAA >60 08/13/2016   GFRAA >60 08/13/2016    Lab Results  Component Value Date   WBC 8.6 08/13/2016   NEUTROABS 6.9 (H) 08/13/2016   HGB 10.1 (L) 08/13/2016   HCT 28.9 (L) 08/13/2016   MCV 91.1 08/13/2016   PLT 341 08/13/2016     STUDIES: Nm Pet Image Restag (ps) Skull Base To Thigh  Result Date: 09/24/2016 CLINICAL DATA:  Subsequent treatment strategy for right-sided small cell lung cancer. EXAM: NUCLEAR MEDICINE PET SKULL BASE TO THIGH TECHNIQUE: And 13.0 mCi F-18 FDG was injected intravenously. Full-ring PET imaging was performed from the skull base to thigh after the radiotracer. CT data was obtained and used for attenuation correction and anatomic localization. FASTING BLOOD GLUCOSE:  Value: 134  mg/dl COMPARISON:  03/08/2016 FINDINGS: NECK A left station IIa lymph node measuring 5 mm in short axis on image 28/3 has maximum standard uptake value of 3.5, compatible with low-grade activity. CHEST Significant improvement in prior adenopathy. For example, a right lower paratracheal lymph node measures 0.7 cm in short axis on image 65/3 with maximum SUV 2.6, previously by my measurements this lymph node measured 1.6 cm with maximum SUV 6.4. Similarly the right hilar adenopathy and subcarinal adenopathy is markedly improved. Prior right supraclavicular adenopathy has resolved. The right central hilar mass and adjacent adenopathy have maximum standard uptake value today of 3.0, formerly 8.0. There continues to be some soft tissue prominence in this vicinity. Confluent ground-glass opacities in the right upper lobe, right middle lobe, and primarily superior segment right lower lobe are present and have accentuated metabolic activity favoring radiation pneumonitis. In the superior segment right lower lobe, SUV measurements of 3.5 are obtained. Bilateral mild airway thickening noted. Small pericardial effusion. Coronary artery atherosclerotic calcification. Trace right pleural effusion. ABDOMEN/PELVIS No abnormal hypermetabolic activity within the liver, pancreas, adrenal glands, or spleen. No hypermetabolic lymph nodes in the abdomen or pelvis. Aortoiliac atherosclerotic vascular disease. SKELETON No focal hypermetabolic activity to suggest skeletal metastasis. IMPRESSION: 1. Considerable reduction in size and metabolic activity of thoracic adenopathy. The right hilar mass is likewise significantly reduced in activity. 2. Moderate metabolic activity in confluent ground-glass opacities in the right lung favoring radiation pneumonitis. 3. There is a left station IIa lymph node measuring 5 mm in short axis with low-grade activity, this was not readily visible on the prior exam and is probably incidental or reactive but  may merit surveillance. 4. Chronic stable appearance of bilateral airway thickening and small pericardial effusion. 5. Trace right pleural effusion. 6.  Aortic Atherosclerosis (ICD10-I70.0).  Coronary atherosclerosis. Electronically Signed   By: Van Clines M.D.   On: 09/24/2016 10:55    ASSESSMENT: Clinical stage IIIB small cell lung cancer of the right lung.  PLAN:    1. Clinical stage IIIB small cell lung cancer of the right lung: Patient completed concurrent chemotherapy and XRT at outside facility and then transferred care to [facility] to complete treatment and long-term monitoring for recurrence. She completed cycle 6 of carboplatinum and etoposide  on August 15, 2016. PET scan results reviewed independently and reported as above with significant improvement of patient's disease burden. No further intervention is needed at this time. Return to clinic in 3 months with repeat imaging using CT scan and further evaluation. Previously, a referral was made to radiation oncology at that time for consideration of PCI.  2. Anemia: Mild, likely secondary to chemotherapy. Monitor.  Approximately 30 minutes was spent in discussion of which greater than 50% was consultation.  Patient expressed understanding and was in agreement with this plan. She also understands that She can call clinic at any time with any questions, concerns, or complaints.   Cancer Staging Small cell lung cancer, right [facility]) Staging form: Lung, AJCC 8th Edition - Clinical stage from 08/18/2016: Stage IIIB (cT2a, cN3, cM0) - Signed by Lloyd Huger, MD on 08/18/2016   Lloyd Huger, MD   09/27/2016 8:48 AM

## 2016-09-24 NOTE — Telephone Encounter (Signed)
Patient advised as below.  

## 2016-09-24 NOTE — Telephone Encounter (Signed)
Patient is requesting an antibiotic called into [facility] on [address]. Patient reports cough, and coughing up green mucous. CB# [phone].

## 2016-09-24 NOTE — Telephone Encounter (Signed)
Have sent prescription zpack to [facility] [address]. Office visit if any fevers, shortness of breath or chest pain.

## 2016-09-24 NOTE — Telephone Encounter (Signed)
Left message advising below.

## 2016-09-24 NOTE — Telephone Encounter (Signed)
Patient has had symptoms X 1 week. No fever. Please advise. Thanks!

## 2016-09-26 ENCOUNTER — Inpatient Hospital Stay: Payer: Medicare Other | Attending: Oncology | Admitting: Oncology

## 2016-09-26 ENCOUNTER — Ambulatory Visit: Payer: Medicare Other | Admitting: Radiation Oncology

## 2016-09-26 VITALS — BP 136/84 | HR 109 | Temp 97.6°F | Resp 20 | Wt 156.4 lb

## 2016-09-26 DIAGNOSIS — Z79899 Other long term (current) drug therapy: Secondary | ICD-10-CM | POA: Diagnosis not present

## 2016-09-26 DIAGNOSIS — Z7984 Long term (current) use of oral hypoglycemic drugs: Secondary | ICD-10-CM | POA: Insufficient documentation

## 2016-09-26 DIAGNOSIS — E119 Type 2 diabetes mellitus without complications: Secondary | ICD-10-CM | POA: Diagnosis not present

## 2016-09-26 DIAGNOSIS — Z9221 Personal history of antineoplastic chemotherapy: Secondary | ICD-10-CM | POA: Insufficient documentation

## 2016-09-26 DIAGNOSIS — D649 Anemia, unspecified: Secondary | ICD-10-CM | POA: Insufficient documentation

## 2016-09-26 DIAGNOSIS — Z7982 Long term (current) use of aspirin: Secondary | ICD-10-CM | POA: Insufficient documentation

## 2016-09-26 DIAGNOSIS — Z880 Allergy status to penicillin: Secondary | ICD-10-CM

## 2016-09-26 DIAGNOSIS — F419 Anxiety disorder, unspecified: Secondary | ICD-10-CM | POA: Diagnosis not present

## 2016-09-26 DIAGNOSIS — Z87891 Personal history of nicotine dependence: Secondary | ICD-10-CM | POA: Diagnosis not present

## 2016-09-26 DIAGNOSIS — C3491 Malignant neoplasm of unspecified part of right bronchus or lung: Secondary | ICD-10-CM | POA: Diagnosis not present

## 2016-09-26 DIAGNOSIS — C3490 Malignant neoplasm of unspecified part of unspecified bronchus or lung: Secondary | ICD-10-CM

## 2016-09-26 DIAGNOSIS — J449 Chronic obstructive pulmonary disease, unspecified: Secondary | ICD-10-CM | POA: Diagnosis not present

## 2016-09-26 MED ORDER — PREDNISONE 10 MG (21) PO TBPK: ORAL_TABLET | ORAL | 0 refills | Status: DC

## 2016-09-27 ENCOUNTER — Telehealth: Payer: Self-pay | Admitting: Family Medicine

## 2016-09-27 NOTE — Telephone Encounter (Signed)
Pt advised.  She is wanted to know if there is something that she can take in addition to Oxycodone.    She also wanted to let you know her cancer is in remission right now.   Thanks,   -Mickel Baas

## 2016-09-27 NOTE — Telephone Encounter (Signed)
Pt called wanting to know if her oxycodone dose can be increased mg to be increased.  Her call back is  [phone]  Thanks teri

## 2016-09-27 NOTE — Telephone Encounter (Signed)
No. She is already taking the highest dose I can prescribe for her.

## 2016-09-28 ENCOUNTER — Other Ambulatory Visit: Payer: Self-pay | Admitting: Family Medicine

## 2016-09-28 DIAGNOSIS — G4700 Insomnia, unspecified: Secondary | ICD-10-CM

## 2016-09-30 NOTE — Telephone Encounter (Signed)
Please call in zolpidem  

## 2016-10-01 ENCOUNTER — Ambulatory Visit
Admission: RE | Admit: 2016-10-01 | Discharge: 2016-10-01 | Disposition: A | Discharge status: Home / Self Care | Payer: Medicare Other | Source: Ambulatory Visit | Attending: Radiation Oncology | Admitting: Radiation Oncology

## 2016-10-01 ENCOUNTER — Other Ambulatory Visit: Payer: Self-pay | Admitting: Family Medicine

## 2016-10-01 ENCOUNTER — Encounter: Payer: Self-pay | Admitting: Radiation Oncology

## 2016-10-01 VITALS — BP 117/81 | HR 104 | Temp 96.7°F | Resp 20 | Ht 58.66 in | Wt 156.9 lb

## 2016-10-01 DIAGNOSIS — Z8701 Personal history of pneumonia (recurrent): Secondary | ICD-10-CM | POA: Insufficient documentation

## 2016-10-01 DIAGNOSIS — Z87891 Personal history of nicotine dependence: Secondary | ICD-10-CM | POA: Insufficient documentation

## 2016-10-01 DIAGNOSIS — G4700 Insomnia, unspecified: Secondary | ICD-10-CM

## 2016-10-01 DIAGNOSIS — I10 Essential (primary) hypertension: Secondary | ICD-10-CM | POA: Insufficient documentation

## 2016-10-01 DIAGNOSIS — M5136 Other intervertebral disc degeneration, lumbar region: Secondary | ICD-10-CM | POA: Diagnosis not present

## 2016-10-01 DIAGNOSIS — J449 Chronic obstructive pulmonary disease, unspecified: Secondary | ICD-10-CM | POA: Insufficient documentation

## 2016-10-01 DIAGNOSIS — K219 Gastro-esophageal reflux disease without esophagitis: Secondary | ICD-10-CM | POA: Diagnosis not present

## 2016-10-01 DIAGNOSIS — Z79899 Other long term (current) drug therapy: Secondary | ICD-10-CM | POA: Insufficient documentation

## 2016-10-01 DIAGNOSIS — Z7982 Long term (current) use of aspirin: Secondary | ICD-10-CM | POA: Insufficient documentation

## 2016-10-01 DIAGNOSIS — F419 Anxiety disorder, unspecified: Secondary | ICD-10-CM | POA: Diagnosis not present

## 2016-10-01 DIAGNOSIS — Z923 Personal history of irradiation: Secondary | ICD-10-CM | POA: Insufficient documentation

## 2016-10-01 DIAGNOSIS — Z7984 Long term (current) use of oral hypoglycemic drugs: Secondary | ICD-10-CM | POA: Insufficient documentation

## 2016-10-01 DIAGNOSIS — Z809 Family history of malignant neoplasm, unspecified: Secondary | ICD-10-CM | POA: Insufficient documentation

## 2016-10-01 DIAGNOSIS — C3491 Malignant neoplasm of unspecified part of right bronchus or lung: Secondary | ICD-10-CM | POA: Diagnosis not present

## 2016-10-01 DIAGNOSIS — C7931 Secondary malignant neoplasm of brain: Secondary | ICD-10-CM | POA: Diagnosis not present

## 2016-10-01 DIAGNOSIS — D649 Anemia, unspecified: Secondary | ICD-10-CM | POA: Insufficient documentation

## 2016-10-01 DIAGNOSIS — J439 Emphysema, unspecified: Secondary | ICD-10-CM | POA: Diagnosis not present

## 2016-10-01 DIAGNOSIS — Z8619 Personal history of other infectious and parasitic diseases: Secondary | ICD-10-CM | POA: Insufficient documentation

## 2016-10-01 DIAGNOSIS — E119 Type 2 diabetes mellitus without complications: Secondary | ICD-10-CM | POA: Diagnosis not present

## 2016-10-01 DIAGNOSIS — Z9221 Personal history of antineoplastic chemotherapy: Secondary | ICD-10-CM | POA: Insufficient documentation

## 2016-10-01 DIAGNOSIS — M129 Arthropathy, unspecified: Secondary | ICD-10-CM | POA: Insufficient documentation

## 2016-10-01 DIAGNOSIS — Z510 Encounter for antineoplastic radiation therapy: Secondary | ICD-10-CM | POA: Diagnosis not present

## 2016-10-01 HISTORY — DX: Allergy, unspecified, initial encounter: T78.40XA

## 2016-10-01 HISTORY — DX: Encounter for other specified aftercare: Z51.89

## 2016-10-01 HISTORY — DX: Gastro-esophageal reflux disease without esophagitis: K21.9

## 2016-10-01 HISTORY — DX: Unspecified glaucoma: H40.9

## 2016-10-01 HISTORY — DX: Anxiety disorder, unspecified: F41.9

## 2016-10-01 NOTE — Consult Note (Signed)
NEW PATIENT EVALUATION  Name: Yvette Riley  MRN: [id number]  Date:   10/01/2016     DOB: March 26, 1955   This 61 y.o. female patient presents to the clinic for initial evaluation of PCI status post complete response status post concurrent chemoradiation for limited stage small cell lung cancer.  REFERRING PHYSICIAN: Birdie Sons, MD  CHIEF COMPLAINT: No chief complaint on file.   DIAGNOSIS: The encounter diagnosis was Small cell lung cancer, right ([place]).   PREVIOUS INVESTIGATIONS:  PET CT scan and CT scans reviewed Pathology reports reviewed Clinical notes reviewed  HPI: Patient is a 61 year old female status post concurrent chemoradiation for stage IIIB limited stage small cell lung cancer of the right lobe. Previous PET CT scan showing hypermetabolic activity in the right hilar and right mediastinal lymph nodes consistent with limited stage small cell lung cancer. She had been treated at [facility] with concurrent chemoradiation with PET CT scan 08/15/2016 showing complete response after cycle 6 of carboplatinum and etoposide. She is transferring her care to our facility has been seen by medical oncology and is now referred to radiation oncology for consideration of PCI. She is doing well she has a persistent cough fairly nonproductive. No hemoptysis. No change in neurologic status. CT scan of her head back in November 2017 was negative for in metastatic disease.  PLANNED TREATMENT REGIMEN: Whole brain radiation  PAST MEDICAL HISTORY:  has a past medical history of Allergy; Anemia; Anxiety; Arthritis; Asthma; Blood transfusion without reported diagnosis; C. difficile diarrhea (04/07/2016); Cancer ([place]); CAP (community acquired pneumonia) (03/21/2015); COPD (chronic obstructive pulmonary disease) ([place]); Diabetes mellitus without complication ([place]); Displacement of lumbar intervertebral disc without myelopathy; Emphysema of lung (HCC); GERD (gastroesophageal reflux disease); Glaucoma;  History of chicken pox; Hypertension; Lung cancer ([place]); Pneumonia (03/29/2015); and Shortness of breath.    PAST SURGICAL HISTORY:  Past Surgical History:  Procedure Laterality Date  . CESAREAN SECTION    . TUBAL LIGATION      FAMILY HISTORY: family history includes COPD in her brother; Cancer in her maternal aunt; Heart disease in her brother and mother; Heart failure in her mother; Stroke in her mother.  SOCIAL HISTORY:  reports that she quit smoking about 6 months ago. Her smoking use included Cigarettes. She has a 11.25 pack-year smoking history. She has never used smokeless tobacco. She reports that she does not drink alcohol or use drugs.  ALLERGIES: Codeine; Crestor [rosuvastatin]; Other; Penicillins; Yellow jacket venom [bee venom]; Gemfibrozil; Statins; Trazodone and nefazodone; and Lipitor [atorvastatin]  MEDICATIONS:  Current Outpatient Prescriptions  Medication Sig Dispense Refill  . albuterol (PROVENTIL HFA;VENTOLIN HFA) 108 (90 Base) MCG/ACT inhaler Inhale 2 puffs into the lungs every 6 (six) hours as needed for wheezing or shortness of breath. 1 Inhaler 12  . aspirin EC 81 MG tablet Take 81 mg by mouth daily.     . clonazePAM (KLONOPIN) 0.5 MG tablet Take 1 tablet (0.5 mg total) by mouth 2 (two) times daily as needed for anxiety. 60 tablet 3  . ferrous sulfate 325 (65 FE) MG tablet Take 1 tablet (325 mg total) by mouth 2 (two) times daily with a meal. 30 tablet 3  . glucose blood test strip Check blood sugar daily. Dx Type 2 diabetes E11.9 100 each 3  . ipratropium-albuterol (DUONEB) 0.5-2.5 (3) MG/3ML SOLN Take 3 mLs by nebulization every 6 (six) hours as needed.    [name] ipratropium-albuterol (DUONEB) 0.5-2.5 (3) MG/3ML SOLN Inhale 3 mLs into the lungs 4 (four) times daily  as needed.    . meclizine (ANTIVERT) 25 MG tablet TAKE ONE TABLET BY MOUTH THREE TIMES DAILY AS NEEDED FOR DIZZINESS 30 tablet 5  . meloxicam (MOBIC) 15 MG tablet Take 1 tablet (15 mg total) by mouth daily as  needed for pain. 90 tablet 2  . metFORMIN (GLUCOPHAGE XR) 500 MG 24 hr tablet Take 2 tablets (1,000 mg total) by mouth daily with breakfast. With meals 1 tablet 1  . mirtazapine (REMERON) 15 MG tablet Take 1 tablet (15 mg total) by mouth at bedtime. 30 tablet 3  . montelukast (SINGULAIR) 10 MG tablet Take 10 mg by mouth at bedtime.    . Nutritional Supplements (GLUCERNA ADVANCE SHAKE) LIQD Drink 1-2 shakes per day 24 Bottle 12  . oxyCODONE-acetaminophen (PERCOCET) 10-325 MG tablet One tablet every 4-6 hours as needed 180 tablet 0  . predniSONE (STERAPRED UNI-PAK 21 TAB) 10 MG (21) TBPK tablet Taper as directed 21 tablet 0  . prochlorperazine (COMPAZINE) 10 MG tablet Take 1 tablet by mouth every 6 (six) hours as needed.    . umeclidinium-vilanterol (ANORO ELLIPTA) 62.5-25 MCG/INH AEPB Inhale 1 puff into the lungs daily. 60 each 4  . zolpidem (AMBIEN) 10 MG tablet TAKE 1 TABLET BY MOUTH AT BEDTIME 30 tablet 4  . ALPRAZolam (XANAX) 0.25 MG tablet Take 1 tablet (0.25 mg total) by mouth 3 (three) times daily as needed for anxiety. (Patient not taking: Reported on 10/01/2016) 60 tablet 5  . sucralfate (CARAFATE) 1 g tablet Take 1 tablet (1 g total) by mouth 4 (four) times daily -  with meals and at bedtime. 120 tablet 4   No current facility-administered medications for this encounter.     ECOG PERFORMANCE STATUS:  0 - Asymptomatic  REVIEW OF SYSTEMS: Except for persistent nonproductive cough Patient denies any weight loss, fatigue, weakness, fever, chills or night sweats. Patient denies any loss of vision, blurred vision. Patient denies any ringing  of the ears or hearing loss. No irregular heartbeat. Patient denies heart murmur or history of fainting. Patient denies any chest pain or pain radiating to her upper extremities. Patient denies any shortness of breath, difficulty breathing at night, cough or hemoptysis. Patient denies any swelling in the lower legs. Patient denies any nausea vomiting,  vomiting of blood, or coffee ground material in the vomitus. Patient denies any stomach pain. Patient states has had normal bowel movements no significant constipation or diarrhea. Patient denies any dysuria, hematuria or significant nocturia. Patient denies any problems walking, swelling in the joints or loss of balance. Patient denies any skin changes, loss of hair or loss of weight. Patient denies any excessive worrying or anxiety or significant depression. Patient denies any problems with insomnia. Patient denies excessive thirst, polyuria, polydipsia. Patient denies any swollen glands, patient denies easy bruising or easy bleeding. Patient denies any recent infections, allergies or URI. Patient "s visual fields have not changed significantly in recent time.    PHYSICAL EXAM: BP 117/81   Pulse (!) 104   Temp (!) 96.7 F (35.9 C) (Tympanic)   Resp 20   Ht 4' 10.66" (1.49 m)   Wt 156 lb 13.7 oz (71.2 kg)   BMI 32.05 kg/m  Well-developed well-nourished patient in NAD. HEENT reveals PERLA, EOMI, discs not visualized.  Oral cavity is clear. No oral mucosal lesions are identified. Neck is clear without evidence of cervical or supraclavicular adenopathy. Lungs are clear to A&P. Cardiac examination is essentially unremarkable with regular rate and rhythm without murmur rub  or thrill. Abdomen is benign with no organomegaly or masses noted. Motor sensory and DTR levels are equal and symmetric in the upper and lower extremities. Cranial nerves II through XII are grossly intact. Proprioception is intact. No peripheral adenopathy or edema is identified. No motor or sensory levels are noted. Crude visual fields are within normal range.  LABORATORY DATA: Pathology reports reviewed and compatible with the above-stated findings    RADIOLOGY RESULTS: Serial CT scans and PET/CT scans reviewed and compatible with the above-stated findings   IMPRESSION: Complete response after completion of concurrent  chemoradiation for limited stage small cell lung cancer of the right lung  PLAN: At this time I to go ahead with whole brain radiation. We'll plan on delivering 3000 cGy in 15 fractions to prevent cognitive decline. Risks and benefits of treatment including hair loss fatigue irritation the scalp and possible cognitive decline all were discussed in detail with the patient. I've personally set up and ordered CT simulation for next week. Patient comprehend my treatment plan well.  I would like to take this opportunity to thank you for allowing me to participate in the care of your patient.Armstead Peaks., MD

## 2016-10-01 NOTE — Telephone Encounter (Signed)
Pharmacy requesting refills. Thanks!  

## 2016-10-01 NOTE — Telephone Encounter (Signed)
Rx called in to pharmacy. 

## 2016-10-07 ENCOUNTER — Ambulatory Visit
Admission: RE | Admit: 2016-10-07 | Discharge: 2016-10-07 | Disposition: A | Discharge status: Home / Self Care | Payer: Medicare Other | Source: Ambulatory Visit | Attending: Radiation Oncology | Admitting: Radiation Oncology

## 2016-10-07 DIAGNOSIS — D649 Anemia, unspecified: Secondary | ICD-10-CM | POA: Diagnosis not present

## 2016-10-07 DIAGNOSIS — F419 Anxiety disorder, unspecified: Secondary | ICD-10-CM | POA: Diagnosis not present

## 2016-10-07 DIAGNOSIS — C3491 Malignant neoplasm of unspecified part of right bronchus or lung: Secondary | ICD-10-CM | POA: Diagnosis not present

## 2016-10-07 DIAGNOSIS — Z510 Encounter for antineoplastic radiation therapy: Secondary | ICD-10-CM | POA: Diagnosis not present

## 2016-10-07 DIAGNOSIS — C7931 Secondary malignant neoplasm of brain: Secondary | ICD-10-CM | POA: Diagnosis not present

## 2016-10-07 DIAGNOSIS — Z87891 Personal history of nicotine dependence: Secondary | ICD-10-CM | POA: Diagnosis not present

## 2016-10-10 DIAGNOSIS — C3491 Malignant neoplasm of unspecified part of right bronchus or lung: Secondary | ICD-10-CM | POA: Diagnosis not present

## 2016-10-10 DIAGNOSIS — Z510 Encounter for antineoplastic radiation therapy: Secondary | ICD-10-CM | POA: Diagnosis not present

## 2016-10-10 DIAGNOSIS — Z87891 Personal history of nicotine dependence: Secondary | ICD-10-CM | POA: Diagnosis not present

## 2016-10-10 DIAGNOSIS — D649 Anemia, unspecified: Secondary | ICD-10-CM | POA: Diagnosis not present

## 2016-10-10 DIAGNOSIS — C7931 Secondary malignant neoplasm of brain: Secondary | ICD-10-CM | POA: Diagnosis not present

## 2016-10-10 DIAGNOSIS — F419 Anxiety disorder, unspecified: Secondary | ICD-10-CM | POA: Diagnosis not present

## 2016-10-11 ENCOUNTER — Other Ambulatory Visit: Payer: Self-pay | Admitting: *Deleted

## 2016-10-11 DIAGNOSIS — C3491 Malignant neoplasm of unspecified part of right bronchus or lung: Secondary | ICD-10-CM

## 2016-10-11 MED ORDER — DEXAMETHASONE 4 MG PO TABS: 4.0000 mg | ORAL_TABLET | Freq: Every day | ORAL | 0 refills | Status: DC

## 2016-10-14 ENCOUNTER — Ambulatory Visit: Admission: RE | Admit: 2016-10-14 | Payer: Medicare Other | Source: Ambulatory Visit

## 2016-10-15 ENCOUNTER — Ambulatory Visit
Admission: RE | Admit: 2016-10-15 | Discharge: 2016-10-15 | Disposition: A | Discharge status: Home / Self Care | Payer: Medicare Other | Source: Ambulatory Visit | Attending: Radiation Oncology | Admitting: Radiation Oncology

## 2016-10-15 DIAGNOSIS — F419 Anxiety disorder, unspecified: Secondary | ICD-10-CM | POA: Diagnosis not present

## 2016-10-15 DIAGNOSIS — C7931 Secondary malignant neoplasm of brain: Secondary | ICD-10-CM | POA: Diagnosis not present

## 2016-10-15 DIAGNOSIS — D649 Anemia, unspecified: Secondary | ICD-10-CM | POA: Diagnosis not present

## 2016-10-15 DIAGNOSIS — Z87891 Personal history of nicotine dependence: Secondary | ICD-10-CM | POA: Diagnosis not present

## 2016-10-15 DIAGNOSIS — Z510 Encounter for antineoplastic radiation therapy: Secondary | ICD-10-CM | POA: Diagnosis not present

## 2016-10-15 DIAGNOSIS — C3491 Malignant neoplasm of unspecified part of right bronchus or lung: Secondary | ICD-10-CM | POA: Diagnosis not present

## 2016-10-16 ENCOUNTER — Ambulatory Visit
Admission: RE | Admit: 2016-10-16 | Discharge: 2016-10-16 | Disposition: A | Discharge status: Home / Self Care | Payer: Medicare Other | Source: Ambulatory Visit | Attending: Radiation Oncology | Admitting: Radiation Oncology

## 2016-10-16 DIAGNOSIS — F419 Anxiety disorder, unspecified: Secondary | ICD-10-CM | POA: Diagnosis not present

## 2016-10-16 DIAGNOSIS — C7931 Secondary malignant neoplasm of brain: Secondary | ICD-10-CM | POA: Diagnosis not present

## 2016-10-16 DIAGNOSIS — C3491 Malignant neoplasm of unspecified part of right bronchus or lung: Secondary | ICD-10-CM | POA: Diagnosis not present

## 2016-10-16 DIAGNOSIS — Z510 Encounter for antineoplastic radiation therapy: Secondary | ICD-10-CM | POA: Diagnosis not present

## 2016-10-16 DIAGNOSIS — Z87891 Personal history of nicotine dependence: Secondary | ICD-10-CM | POA: Diagnosis not present

## 2016-10-16 DIAGNOSIS — D649 Anemia, unspecified: Secondary | ICD-10-CM | POA: Diagnosis not present

## 2016-10-17 ENCOUNTER — Ambulatory Visit
Admission: RE | Admit: 2016-10-17 | Discharge: 2016-10-17 | Disposition: A | Discharge status: Home / Self Care | Payer: Medicare Other | Source: Ambulatory Visit | Attending: Radiation Oncology | Admitting: Radiation Oncology

## 2016-10-17 DIAGNOSIS — F419 Anxiety disorder, unspecified: Secondary | ICD-10-CM | POA: Diagnosis not present

## 2016-10-17 DIAGNOSIS — C7931 Secondary malignant neoplasm of brain: Secondary | ICD-10-CM | POA: Diagnosis not present

## 2016-10-17 DIAGNOSIS — D649 Anemia, unspecified: Secondary | ICD-10-CM | POA: Diagnosis not present

## 2016-10-17 DIAGNOSIS — C3491 Malignant neoplasm of unspecified part of right bronchus or lung: Secondary | ICD-10-CM | POA: Diagnosis not present

## 2016-10-17 DIAGNOSIS — Z87891 Personal history of nicotine dependence: Secondary | ICD-10-CM | POA: Diagnosis not present

## 2016-10-17 DIAGNOSIS — Z510 Encounter for antineoplastic radiation therapy: Secondary | ICD-10-CM | POA: Diagnosis not present

## 2016-10-18 ENCOUNTER — Ambulatory Visit
Admission: RE | Admit: 2016-10-18 | Discharge: 2016-10-18 | Disposition: A | Discharge status: Home / Self Care | Payer: Medicare Other | Source: Ambulatory Visit | Attending: Radiation Oncology | Admitting: Radiation Oncology

## 2016-10-18 DIAGNOSIS — C7931 Secondary malignant neoplasm of brain: Secondary | ICD-10-CM | POA: Diagnosis not present

## 2016-10-18 DIAGNOSIS — C3491 Malignant neoplasm of unspecified part of right bronchus or lung: Secondary | ICD-10-CM | POA: Diagnosis not present

## 2016-10-18 DIAGNOSIS — Z87891 Personal history of nicotine dependence: Secondary | ICD-10-CM | POA: Diagnosis not present

## 2016-10-18 DIAGNOSIS — Z510 Encounter for antineoplastic radiation therapy: Secondary | ICD-10-CM | POA: Diagnosis not present

## 2016-10-18 DIAGNOSIS — D649 Anemia, unspecified: Secondary | ICD-10-CM | POA: Diagnosis not present

## 2016-10-18 DIAGNOSIS — F419 Anxiety disorder, unspecified: Secondary | ICD-10-CM | POA: Diagnosis not present

## 2016-10-21 ENCOUNTER — Ambulatory Visit
Admission: RE | Admit: 2016-10-21 | Discharge: 2016-10-21 | Disposition: A | Discharge status: Home / Self Care | Payer: Medicare Other | Source: Ambulatory Visit | Attending: Radiation Oncology | Admitting: Radiation Oncology

## 2016-10-21 DIAGNOSIS — F419 Anxiety disorder, unspecified: Secondary | ICD-10-CM | POA: Diagnosis not present

## 2016-10-21 DIAGNOSIS — D649 Anemia, unspecified: Secondary | ICD-10-CM | POA: Diagnosis not present

## 2016-10-21 DIAGNOSIS — C3491 Malignant neoplasm of unspecified part of right bronchus or lung: Secondary | ICD-10-CM | POA: Diagnosis not present

## 2016-10-21 DIAGNOSIS — Z510 Encounter for antineoplastic radiation therapy: Secondary | ICD-10-CM | POA: Diagnosis not present

## 2016-10-21 DIAGNOSIS — Z87891 Personal history of nicotine dependence: Secondary | ICD-10-CM | POA: Diagnosis not present

## 2016-10-21 DIAGNOSIS — C7931 Secondary malignant neoplasm of brain: Secondary | ICD-10-CM | POA: Diagnosis not present

## 2016-10-22 ENCOUNTER — Ambulatory Visit
Admission: RE | Admit: 2016-10-22 | Discharge: 2016-10-22 | Disposition: A | Discharge status: Home / Self Care | Payer: Medicare Other | Source: Ambulatory Visit | Attending: Radiation Oncology | Admitting: Radiation Oncology

## 2016-10-22 ENCOUNTER — Inpatient Hospital Stay: Payer: Medicare Other | Attending: Radiation Oncology

## 2016-10-22 DIAGNOSIS — C3491 Malignant neoplasm of unspecified part of right bronchus or lung: Secondary | ICD-10-CM | POA: Diagnosis not present

## 2016-10-22 DIAGNOSIS — D649 Anemia, unspecified: Secondary | ICD-10-CM | POA: Diagnosis not present

## 2016-10-22 DIAGNOSIS — C7931 Secondary malignant neoplasm of brain: Secondary | ICD-10-CM | POA: Diagnosis not present

## 2016-10-22 DIAGNOSIS — F419 Anxiety disorder, unspecified: Secondary | ICD-10-CM | POA: Diagnosis not present

## 2016-10-22 DIAGNOSIS — Z510 Encounter for antineoplastic radiation therapy: Secondary | ICD-10-CM | POA: Diagnosis not present

## 2016-10-22 DIAGNOSIS — Z87891 Personal history of nicotine dependence: Secondary | ICD-10-CM | POA: Diagnosis not present

## 2016-10-23 ENCOUNTER — Ambulatory Visit
Admission: RE | Admit: 2016-10-23 | Discharge: 2016-10-23 | Disposition: A | Discharge status: Home / Self Care | Payer: Medicare Other | Source: Ambulatory Visit | Attending: Radiation Oncology | Admitting: Radiation Oncology

## 2016-10-23 DIAGNOSIS — F419 Anxiety disorder, unspecified: Secondary | ICD-10-CM | POA: Diagnosis not present

## 2016-10-23 DIAGNOSIS — Z87891 Personal history of nicotine dependence: Secondary | ICD-10-CM | POA: Diagnosis not present

## 2016-10-23 DIAGNOSIS — Z510 Encounter for antineoplastic radiation therapy: Secondary | ICD-10-CM | POA: Diagnosis not present

## 2016-10-23 DIAGNOSIS — D649 Anemia, unspecified: Secondary | ICD-10-CM | POA: Diagnosis not present

## 2016-10-23 DIAGNOSIS — C7931 Secondary malignant neoplasm of brain: Secondary | ICD-10-CM | POA: Diagnosis not present

## 2016-10-23 DIAGNOSIS — C3491 Malignant neoplasm of unspecified part of right bronchus or lung: Secondary | ICD-10-CM | POA: Diagnosis not present

## 2016-10-24 ENCOUNTER — Ambulatory Visit
Admission: RE | Admit: 2016-10-24 | Discharge: 2016-10-24 | Disposition: A | Discharge status: Home / Self Care | Payer: Medicare Other | Source: Ambulatory Visit | Attending: Radiation Oncology | Admitting: Radiation Oncology

## 2016-10-24 DIAGNOSIS — C7931 Secondary malignant neoplasm of brain: Secondary | ICD-10-CM | POA: Diagnosis not present

## 2016-10-24 DIAGNOSIS — Z87891 Personal history of nicotine dependence: Secondary | ICD-10-CM | POA: Diagnosis not present

## 2016-10-24 DIAGNOSIS — C3491 Malignant neoplasm of unspecified part of right bronchus or lung: Secondary | ICD-10-CM | POA: Diagnosis not present

## 2016-10-24 DIAGNOSIS — Z510 Encounter for antineoplastic radiation therapy: Secondary | ICD-10-CM | POA: Diagnosis not present

## 2016-10-24 DIAGNOSIS — F419 Anxiety disorder, unspecified: Secondary | ICD-10-CM | POA: Diagnosis not present

## 2016-10-24 DIAGNOSIS — D649 Anemia, unspecified: Secondary | ICD-10-CM | POA: Diagnosis not present

## 2016-10-25 ENCOUNTER — Ambulatory Visit
Admission: RE | Admit: 2016-10-25 | Discharge: 2016-10-25 | Disposition: A | Discharge status: Home / Self Care | Payer: Medicare Other | Source: Ambulatory Visit | Attending: Radiation Oncology | Admitting: Radiation Oncology

## 2016-10-25 DIAGNOSIS — Z87891 Personal history of nicotine dependence: Secondary | ICD-10-CM | POA: Diagnosis not present

## 2016-10-25 DIAGNOSIS — Z510 Encounter for antineoplastic radiation therapy: Secondary | ICD-10-CM | POA: Diagnosis not present

## 2016-10-25 DIAGNOSIS — C7931 Secondary malignant neoplasm of brain: Secondary | ICD-10-CM | POA: Diagnosis not present

## 2016-10-25 DIAGNOSIS — C3491 Malignant neoplasm of unspecified part of right bronchus or lung: Secondary | ICD-10-CM | POA: Diagnosis not present

## 2016-10-25 DIAGNOSIS — D649 Anemia, unspecified: Secondary | ICD-10-CM | POA: Diagnosis not present

## 2016-10-25 DIAGNOSIS — F419 Anxiety disorder, unspecified: Secondary | ICD-10-CM | POA: Diagnosis not present

## 2016-10-28 ENCOUNTER — Ambulatory Visit
Admission: RE | Admit: 2016-10-28 | Discharge: 2016-10-28 | Disposition: A | Discharge status: Home / Self Care | Payer: Medicare Other | Source: Ambulatory Visit | Attending: Radiation Oncology | Admitting: Radiation Oncology

## 2016-10-28 DIAGNOSIS — F419 Anxiety disorder, unspecified: Secondary | ICD-10-CM | POA: Diagnosis not present

## 2016-10-28 DIAGNOSIS — D649 Anemia, unspecified: Secondary | ICD-10-CM | POA: Diagnosis not present

## 2016-10-28 DIAGNOSIS — C3491 Malignant neoplasm of unspecified part of right bronchus or lung: Secondary | ICD-10-CM | POA: Diagnosis not present

## 2016-10-28 DIAGNOSIS — C7931 Secondary malignant neoplasm of brain: Secondary | ICD-10-CM | POA: Diagnosis not present

## 2016-10-28 DIAGNOSIS — Z87891 Personal history of nicotine dependence: Secondary | ICD-10-CM | POA: Diagnosis not present

## 2016-10-28 DIAGNOSIS — Z510 Encounter for antineoplastic radiation therapy: Secondary | ICD-10-CM | POA: Diagnosis not present

## 2016-10-29 ENCOUNTER — Inpatient Hospital Stay: Payer: Medicare Other | Attending: Oncology

## 2016-10-29 ENCOUNTER — Ambulatory Visit
Admission: RE | Admit: 2016-10-29 | Discharge: 2016-10-29 | Disposition: A | Discharge status: Home / Self Care | Payer: Medicare Other | Source: Ambulatory Visit | Attending: Radiation Oncology | Admitting: Radiation Oncology

## 2016-10-29 ENCOUNTER — Other Ambulatory Visit: Payer: Self-pay | Admitting: *Deleted

## 2016-10-29 DIAGNOSIS — Z510 Encounter for antineoplastic radiation therapy: Secondary | ICD-10-CM | POA: Diagnosis not present

## 2016-10-29 DIAGNOSIS — Z87891 Personal history of nicotine dependence: Secondary | ICD-10-CM | POA: Diagnosis not present

## 2016-10-29 DIAGNOSIS — C7931 Secondary malignant neoplasm of brain: Secondary | ICD-10-CM | POA: Diagnosis not present

## 2016-10-29 DIAGNOSIS — F419 Anxiety disorder, unspecified: Secondary | ICD-10-CM | POA: Diagnosis not present

## 2016-10-29 DIAGNOSIS — C3491 Malignant neoplasm of unspecified part of right bronchus or lung: Secondary | ICD-10-CM | POA: Diagnosis not present

## 2016-10-29 DIAGNOSIS — D649 Anemia, unspecified: Secondary | ICD-10-CM | POA: Diagnosis not present

## 2016-10-29 MED ORDER — DEXAMETHASONE 4 MG PO TABS: 4.0000 mg | ORAL_TABLET | Freq: Every day | ORAL | 0 refills | Status: DC

## 2016-10-30 ENCOUNTER — Other Ambulatory Visit: Payer: Self-pay | Admitting: *Deleted

## 2016-10-30 ENCOUNTER — Ambulatory Visit
Admission: RE | Admit: 2016-10-30 | Discharge: 2016-10-30 | Disposition: A | Discharge status: Home / Self Care | Payer: Medicare Other | Source: Ambulatory Visit | Attending: Radiation Oncology | Admitting: Radiation Oncology

## 2016-10-30 DIAGNOSIS — C7931 Secondary malignant neoplasm of brain: Secondary | ICD-10-CM | POA: Diagnosis not present

## 2016-10-30 DIAGNOSIS — Z87891 Personal history of nicotine dependence: Secondary | ICD-10-CM | POA: Diagnosis not present

## 2016-10-30 DIAGNOSIS — Z510 Encounter for antineoplastic radiation therapy: Secondary | ICD-10-CM | POA: Diagnosis not present

## 2016-10-30 DIAGNOSIS — F419 Anxiety disorder, unspecified: Secondary | ICD-10-CM | POA: Diagnosis not present

## 2016-10-30 DIAGNOSIS — C3491 Malignant neoplasm of unspecified part of right bronchus or lung: Secondary | ICD-10-CM | POA: Diagnosis not present

## 2016-10-30 DIAGNOSIS — D649 Anemia, unspecified: Secondary | ICD-10-CM | POA: Diagnosis not present

## 2016-10-30 MED ORDER — SUCRALFATE 1 G PO TABS: 1.0000 g | ORAL_TABLET | Freq: Three times a day (TID) | ORAL | 4 refills | Status: DC

## 2016-10-31 ENCOUNTER — Ambulatory Visit
Admission: RE | Admit: 2016-10-31 | Discharge: 2016-10-31 | Disposition: A | Discharge status: Home / Self Care | Payer: Medicare Other | Source: Ambulatory Visit | Attending: Radiation Oncology | Admitting: Radiation Oncology

## 2016-10-31 DIAGNOSIS — Z87891 Personal history of nicotine dependence: Secondary | ICD-10-CM | POA: Diagnosis not present

## 2016-10-31 DIAGNOSIS — F419 Anxiety disorder, unspecified: Secondary | ICD-10-CM | POA: Diagnosis not present

## 2016-10-31 DIAGNOSIS — D649 Anemia, unspecified: Secondary | ICD-10-CM | POA: Diagnosis not present

## 2016-10-31 DIAGNOSIS — Z510 Encounter for antineoplastic radiation therapy: Secondary | ICD-10-CM | POA: Diagnosis not present

## 2016-10-31 DIAGNOSIS — C3491 Malignant neoplasm of unspecified part of right bronchus or lung: Secondary | ICD-10-CM | POA: Diagnosis not present

## 2016-10-31 DIAGNOSIS — C7931 Secondary malignant neoplasm of brain: Secondary | ICD-10-CM | POA: Diagnosis not present

## 2016-11-01 ENCOUNTER — Ambulatory Visit
Admission: RE | Admit: 2016-11-01 | Discharge: 2016-11-01 | Disposition: A | Discharge status: Home / Self Care | Payer: Medicare Other | Source: Ambulatory Visit | Attending: Radiation Oncology | Admitting: Radiation Oncology

## 2016-11-01 DIAGNOSIS — Z510 Encounter for antineoplastic radiation therapy: Secondary | ICD-10-CM | POA: Diagnosis not present

## 2016-11-01 DIAGNOSIS — Z87891 Personal history of nicotine dependence: Secondary | ICD-10-CM | POA: Diagnosis not present

## 2016-11-01 DIAGNOSIS — F419 Anxiety disorder, unspecified: Secondary | ICD-10-CM | POA: Diagnosis not present

## 2016-11-01 DIAGNOSIS — D649 Anemia, unspecified: Secondary | ICD-10-CM | POA: Diagnosis not present

## 2016-11-01 DIAGNOSIS — C7931 Secondary malignant neoplasm of brain: Secondary | ICD-10-CM | POA: Diagnosis not present

## 2016-11-01 DIAGNOSIS — C3491 Malignant neoplasm of unspecified part of right bronchus or lung: Secondary | ICD-10-CM | POA: Diagnosis not present

## 2016-11-04 ENCOUNTER — Ambulatory Visit
Admission: RE | Admit: 2016-11-04 | Discharge: 2016-11-04 | Disposition: A | Discharge status: Home / Self Care | Payer: Medicare Other | Source: Ambulatory Visit | Attending: Radiation Oncology | Admitting: Radiation Oncology

## 2016-11-04 DIAGNOSIS — Z87891 Personal history of nicotine dependence: Secondary | ICD-10-CM | POA: Diagnosis not present

## 2016-11-04 DIAGNOSIS — D649 Anemia, unspecified: Secondary | ICD-10-CM | POA: Diagnosis not present

## 2016-11-04 DIAGNOSIS — C3491 Malignant neoplasm of unspecified part of right bronchus or lung: Secondary | ICD-10-CM | POA: Diagnosis not present

## 2016-11-04 DIAGNOSIS — F419 Anxiety disorder, unspecified: Secondary | ICD-10-CM | POA: Diagnosis not present

## 2016-11-04 DIAGNOSIS — Z510 Encounter for antineoplastic radiation therapy: Secondary | ICD-10-CM | POA: Diagnosis not present

## 2016-11-04 DIAGNOSIS — C7931 Secondary malignant neoplasm of brain: Secondary | ICD-10-CM | POA: Diagnosis not present

## 2016-11-05 ENCOUNTER — Ambulatory Visit (INDEPENDENT_AMBULATORY_CARE_PROVIDER_SITE_OTHER): Payer: Medicare Other | Admitting: Family Medicine

## 2016-11-05 ENCOUNTER — Encounter: Payer: Self-pay | Admitting: Family Medicine

## 2016-11-05 VITALS — BP 126/70 | HR 110 | Temp 98.6°F | Resp 16 | Wt 171.0 lb

## 2016-11-05 DIAGNOSIS — G8929 Other chronic pain: Secondary | ICD-10-CM

## 2016-11-05 DIAGNOSIS — I10 Essential (primary) hypertension: Secondary | ICD-10-CM | POA: Diagnosis not present

## 2016-11-05 DIAGNOSIS — F419 Anxiety disorder, unspecified: Secondary | ICD-10-CM

## 2016-11-05 DIAGNOSIS — E119 Type 2 diabetes mellitus without complications: Secondary | ICD-10-CM

## 2016-11-05 DIAGNOSIS — M545 Low back pain: Secondary | ICD-10-CM | POA: Diagnosis not present

## 2016-11-05 LAB — POCT GLYCOSYLATED HEMOGLOBIN (HGB A1C)
Est. average glucose Bld gHb Est-mCnc: 157
Hemoglobin A1C: 7.1

## 2016-11-05 LAB — POCT UA - MICROALBUMIN: Microalbumin Ur, POC: 50 mg/L

## 2016-11-05 MED ORDER — OXYCODONE-ACETAMINOPHEN 10-325 MG PO TABS: ORAL_TABLET | ORAL | 0 refills | Status: DC

## 2016-11-05 NOTE — Progress Notes (Addendum)
Patient: Yvette Riley Female    DOB: [birth year]   61 y.o.   MRN: [id number] Visit Date: 11/05/2016  Today's Provider: Lelon Huh, MD   Chief Complaint  Patient presents with  . Diabetes  . Insomnia  . Anxiety  . Back Pain   Subjective:    HPI  Diabetes Mellitus Type II, Follow-up:   Lab Results  Component Value Date   HGBA1C 7.1 11/05/2016   HGBA1C 6.4 07/19/2016   HGBA1C 6.0 02/19/2016    Last seen for diabetes 4 months ago.  Management since then includes no changes. She reports good compliance with treatment. She is not having side effects.  Current symptoms include none and have been stable. Home blood sugar records: trend: stable  Episodes of hypoglycemia? no   Current Insulin Regimen: none  Most Recent Eye Exam: up to date Weight trend: stable Prior visit with dietician: no Current diet: well balanced Current exercise: none  Pertinent Labs:    Component Value Date/Time   CREATININE 0.61 08/13/2016 0830   CREATININE 0.68 02/02/2012 1114    Wt Readings from Last 3 Encounters:  11/05/16 171 lb (77.6 kg)  10/01/16 156 lb 13.7 oz (71.2 kg)  09/26/16 156 lb 6.4 oz (70.9 kg)    Insomnia, follow up: Patient was last seen in the office 4 months ago. On last OV, she was started back on Ambien. She was also advised to alternate Remeron if needed. Patient reports that she is still having difficulty sleeping. She averages about 5 hours a night.   Anxiety, follow up: Patient was last seen in the office 4 months ago. Since last OV, she was switched from alprazolam to clonazepam due to insurance coverage. She has been tolerating medication changes well.    Chronic back pain: Patient was last seen in the office 4 months ago. No changes were made in her medications. She is requesting a refill of her pain medication today.   Allergies  Allergen Reactions  . Codeine Anaphylaxis  . Crestor [Rosuvastatin] Anaphylaxis  . Other Anaphylaxis  .  Penicillins Anaphylaxis, Rash and Other (See Comments)    Reaction:  Tongue swelling  Has patient had a PCN reaction causing immediate rash, facial/tongue/throat swelling, SOB or lightheadedness with hypotension:  Yes   Has patient had a PCN reaction causing severe rash involving mucus membranes or skin necrosis: No Has patient had a PCN reaction that required hospitalization No Has patient had a PCN reaction occurring within the last 10 years: No If all of the above answers are "NO", then may proceed with Cephalosporin use.  . [place] Venom [Bee Venom] Anaphylaxis  . Gemfibrozil Rash, Nausea And Vomiting and Swelling  . Statins Nausea And Vomiting and Swelling  . Trazodone And Nefazodone Nausea And Vomiting  . Lipitor [Atorvastatin] Rash     Current Outpatient Prescriptions:  .  albuterol (PROVENTIL HFA;VENTOLIN HFA) 108 (90 Base) MCG/ACT inhaler, Inhale 2 puffs into the lungs every 6 (six) hours as needed for wheezing or shortness of breath., Disp: 1 Inhaler, Rfl: 12 .  aspirin EC 81 MG tablet, Take 81 mg by mouth daily. , Disp: , Rfl:  .  clonazePAM (KLONOPIN) 0.5 MG tablet, Take 1 tablet (0.5 mg total) by mouth 2 (two) times daily as needed for anxiety., Disp: 60 tablet, Rfl: 3 .  dexamethasone (DECADRON) 4 MG tablet, Take 1 tablet (4 mg total) by mouth daily. Begin on 1st day of radiation, Disp: 25 tablet, Rfl:  0 .  dexamethasone (DECADRON) 4 MG tablet, Take 1 tablet (4 mg total) by mouth daily., Disp: 20 tablet, Rfl: 0 .  ferrous sulfate 325 (65 FE) MG tablet, Take 1 tablet (325 mg total) by mouth 2 (two) times daily with a meal., Disp: 30 tablet, Rfl: 3 .  glucose blood test strip, Check blood sugar daily. Dx Type 2 diabetes E11.9, Disp: 100 each, Rfl: 3 .  ipratropium-albuterol (DUONEB) 0.5-2.5 (3) MG/3ML SOLN, Take 3 mLs by nebulization every 6 (six) hours as needed., Disp: , Rfl:  .  meclizine (ANTIVERT) 25 MG tablet, TAKE ONE TABLET BY MOUTH THREE TIMES DAILY AS NEEDED FOR  DIZZINESS, Disp: 30 tablet, Rfl: 5 .  meloxicam (MOBIC) 15 MG tablet, Take 1 tablet (15 mg total) by mouth daily as needed for pain., Disp: 90 tablet, Rfl: 2 .  metFORMIN (GLUCOPHAGE XR) 500 MG 24 hr tablet, Take 2 tablets (1,000 mg total) by mouth daily with breakfast. With meals, Disp: 1 tablet, Rfl: 1 .  mirtazapine (REMERON) 15 MG tablet, Take 1 tablet (15 mg total) by mouth at bedtime., Disp: 30 tablet, Rfl: 3 .  montelukast (SINGULAIR) 10 MG tablet, Take 10 mg by mouth at bedtime., Disp: , Rfl:  .  Nutritional Supplements (GLUCERNA ADVANCE SHAKE) LIQD, Drink 1-2 shakes per day, Disp: 24 Bottle, Rfl: 12 .  oxyCODONE-acetaminophen (PERCOCET) 10-325 MG tablet, One tablet every 4-6 hours as needed, Disp: 180 tablet, Rfl: 0 .  prochlorperazine (COMPAZINE) 10 MG tablet, Take 1 tablet by mouth every 6 (six) hours as needed., Disp: , Rfl:  .  sucralfate (CARAFATE) 1 g tablet, Take 1 tablet (1 g total) by mouth 4 (four) times daily -  with meals and at bedtime., Disp: 120 tablet, Rfl: 4 .  umeclidinium-vilanterol (ANORO ELLIPTA) 62.5-25 MCG/INH AEPB, Inhale 1 puff into the lungs daily., Disp: 60 each, Rfl: 4 .  zolpidem (AMBIEN) 10 MG tablet, TAKE 1 TABLET BY MOUTH AT BEDTIME, Disp: 30 tablet, Rfl: 4 .  ALPRAZolam (XANAX) 0.25 MG tablet, Take 1 tablet (0.25 mg total) by mouth 3 (three) times daily as needed for anxiety. (Patient not taking: Reported on 10/01/2016), Disp: 60 tablet, Rfl: 5 .  ipratropium-albuterol (DUONEB) 0.5-2.5 (3) MG/3ML SOLN, Inhale 3 mLs into the lungs 4 (four) times daily as needed., Disp: , Rfl:  .  predniSONE (STERAPRED UNI-PAK 21 TAB) 10 MG (21) TBPK tablet, Taper as directed (Patient not taking: Reported on 11/05/2016), Disp: 21 tablet, Rfl: 0  Review of Systems  Constitutional: Positive for fatigue.  Respiratory: Negative.   Cardiovascular: Negative for chest pain, palpitations and leg swelling.  Endocrine: Negative.   Musculoskeletal: Positive for arthralgias and myalgias.    Neurological: Positive for headaches.  Psychiatric/Behavioral: Negative.     Social History  Substance Use Topics  . Smoking status: Former Smoker    Packs/day: 0.25    Years: 45.00    Types: Cigarettes    Quit date: 03/17/2016  . Smokeless tobacco: Never Used     Comment: previously smoked 2 1/2 ppd for 30 years.   . Alcohol use No   Objective:   BP 126/70 (BP Location: Right Arm, Patient Position: Sitting, Cuff Size: Normal)   Pulse (!) 110   Temp 98.6 F (37 C)   Resp 16   Wt 171 lb (77.6 kg)   SpO2 97%   BMI 34.94 kg/m     Physical Exam   General Appearance:    Alert, cooperative, no distress, obese  Eyes:  PERRL, conjunctiva/corneas clear, EOM's intact       Lungs:     Clear to auscultation bilaterally, respirations unlabored  Heart:    Regular rate and rhythm  Neurologic:   Awake, alert, oriented x 3. No apparent focal neurological           defect.        Results for orders placed or performed in visit on 11/05/16  POCT glycosylated hemoglobin (Hb A1C)  Result Value Ref Range   Hemoglobin A1C 7.1    Est. average glucose Bld gHb Est-mCnc 157   POCT UA - Microalbumin  Result Value Ref Range   Microalbumin Ur, POC 50 mg/L   Creatinine, POC  mg/dL   Albumin/Creatinine Ratio, Urine, POC         Assessment & Plan:     1. Type 2 diabetes mellitus without complication, without long-term current use of insulin ([facility]CC) Well controlled. Continue current medications.  - POCT glycosylated hemoglobin (Hb A1C) - POCT UA - Microalbumin  2. Essential hypertension Well controlled.  Continue current medications.    3. Anxiety Stable on current dose of clonazepam.   4. Chronic midline low back pain, with sciatica presence unspecified Doing well with current dose of oxycodone/apap  - oxyCODONE-acetaminophen (PERCOCET) 10-325 MG tablet; One tablet every 4-6 hours as needed  Dispense: 180 tablet; Refill: 0  Delay flu vaccine until she if off steroids in a few  more weeks.  Return in about 2 weeks (around 11/21/2016) for flu shot. 4 month for diabetes.       Lelon Huh, MD  [facility]

## 2016-11-19 ENCOUNTER — Ambulatory Visit: Payer: Medicare Other | Admitting: Family Medicine

## 2016-11-23 ENCOUNTER — Ambulatory Visit (INDEPENDENT_AMBULATORY_CARE_PROVIDER_SITE_OTHER): Payer: Medicare Other

## 2016-11-23 DIAGNOSIS — Z23 Encounter for immunization: Secondary | ICD-10-CM | POA: Diagnosis not present

## 2016-11-27 ENCOUNTER — Telehealth: Payer: Self-pay | Admitting: *Deleted

## 2016-11-27 NOTE — Telephone Encounter (Signed)
Received call from patient in regards to her upcoming appts.. Patient had forgotten her appt dates. Mailed out calendar on 11/27/16

## 2016-12-04 ENCOUNTER — Other Ambulatory Visit: Payer: Self-pay

## 2016-12-04 ENCOUNTER — Encounter: Payer: Self-pay | Admitting: Radiation Oncology

## 2016-12-04 ENCOUNTER — Other Ambulatory Visit: Payer: Self-pay | Admitting: *Deleted

## 2016-12-04 ENCOUNTER — Ambulatory Visit
Admission: RE | Admit: 2016-12-04 | Discharge: 2016-12-04 | Disposition: A | Discharge status: Home / Self Care | Payer: Medicare Other | Source: Ambulatory Visit | Attending: Oncology | Admitting: Oncology

## 2016-12-04 ENCOUNTER — Ambulatory Visit
Admission: RE | Admit: 2016-12-04 | Discharge: 2016-12-04 | Disposition: A | Discharge status: Home / Self Care | Payer: Medicare Other | Source: Ambulatory Visit | Attending: Radiation Oncology | Admitting: Radiation Oncology

## 2016-12-04 ENCOUNTER — Inpatient Hospital Stay: Payer: Medicare Other | Attending: Oncology | Admitting: Oncology

## 2016-12-04 ENCOUNTER — Inpatient Hospital Stay: Payer: Medicare Other

## 2016-12-04 ENCOUNTER — Encounter: Payer: Self-pay | Admitting: Oncology

## 2016-12-04 VITALS — BP 148/93 | HR 103 | Temp 97.6°F | Resp 22 | Wt 162.3 lb

## 2016-12-04 VITALS — Temp 97.0°F

## 2016-12-04 DIAGNOSIS — M545 Low back pain, unspecified: Secondary | ICD-10-CM

## 2016-12-04 DIAGNOSIS — R000 Tachycardia, unspecified: Secondary | ICD-10-CM | POA: Diagnosis not present

## 2016-12-04 DIAGNOSIS — M419 Scoliosis, unspecified: Secondary | ICD-10-CM | POA: Insufficient documentation

## 2016-12-04 DIAGNOSIS — R05 Cough: Secondary | ICD-10-CM | POA: Diagnosis not present

## 2016-12-04 DIAGNOSIS — Z803 Family history of malignant neoplasm of breast: Secondary | ICD-10-CM | POA: Insufficient documentation

## 2016-12-04 DIAGNOSIS — Z87891 Personal history of nicotine dependence: Secondary | ICD-10-CM | POA: Diagnosis not present

## 2016-12-04 DIAGNOSIS — R062 Wheezing: Secondary | ICD-10-CM

## 2016-12-04 DIAGNOSIS — G4452 New daily persistent headache (NDPH): Secondary | ICD-10-CM

## 2016-12-04 DIAGNOSIS — R0602 Shortness of breath: Secondary | ICD-10-CM | POA: Insufficient documentation

## 2016-12-04 DIAGNOSIS — Z79899 Other long term (current) drug therapy: Secondary | ICD-10-CM | POA: Insufficient documentation

## 2016-12-04 DIAGNOSIS — Z5189 Encounter for other specified aftercare: Secondary | ICD-10-CM

## 2016-12-04 DIAGNOSIS — Z7982 Long term (current) use of aspirin: Secondary | ICD-10-CM | POA: Diagnosis not present

## 2016-12-04 DIAGNOSIS — Z923 Personal history of irradiation: Secondary | ICD-10-CM | POA: Insufficient documentation

## 2016-12-04 DIAGNOSIS — R51 Headache: Secondary | ICD-10-CM | POA: Diagnosis not present

## 2016-12-04 DIAGNOSIS — C3491 Malignant neoplasm of unspecified part of right bronchus or lung: Secondary | ICD-10-CM | POA: Insufficient documentation

## 2016-12-04 DIAGNOSIS — R918 Other nonspecific abnormal finding of lung field: Secondary | ICD-10-CM | POA: Insufficient documentation

## 2016-12-04 DIAGNOSIS — R059 Cough, unspecified: Secondary | ICD-10-CM

## 2016-12-04 LAB — COMPREHENSIVE METABOLIC PANEL
ALT: 13 U/L — AB (ref 14–54)
AST: 19 U/L (ref 15–41)
Albumin: 3.9 g/dL (ref 3.5–5.0)
Alkaline Phosphatase: 79 U/L (ref 38–126)
Anion gap: 8 (ref 5–15)
BUN: 8 mg/dL (ref 6–20)
CHLORIDE: 107 mmol/L (ref 101–111)
CO2: 23 mmol/L (ref 22–32)
CREATININE: 0.56 mg/dL (ref 0.44–1.00)
Calcium: 9.1 mg/dL (ref 8.9–10.3)
GFR calc non Af Amer: >60 mL/min (ref >60)
Glucose, Bld: 190 mg/dL — ABNORMAL HIGH (ref 65–99)
Potassium: 3.8 mmol/L (ref 3.5–5.1)
SODIUM: 138 mmol/L (ref 135–145)
Total Bilirubin: 0.8 mg/dL (ref 0.3–1.2)
Total Protein: 7.8 g/dL (ref 6.5–8.1)

## 2016-12-04 LAB — CBC WITH DIFFERENTIAL/PLATELET
BASOS ABS: 0 10*3/uL (ref 0–0.1)
BASOS PCT: 1 %
EOS ABS: 0.1 10*3/uL (ref 0–0.7)
EOS PCT: 2 %
HCT: 36.2 % (ref 35.0–47.0)
Hemoglobin: 12.2 g/dL (ref 12.0–16.0)
LYMPHS ABS: 0.6 10*3/uL — AB (ref 1.0–3.6)
Lymphocytes Relative: 12 %
MCH: 29 pg (ref 26.0–34.0)
MCHC: 33.6 g/dL (ref 32.0–36.0)
MCV: 86.1 fL (ref 80.0–100.0)
Monocytes Absolute: 0.2 10*3/uL (ref 0.2–0.9)
Monocytes Relative: 4 %
Neutro Abs: 4.2 10*3/uL (ref 1.4–6.5)
Neutrophils Relative %: 81 %
PLATELETS: 282 10*3/uL (ref 150–440)
RBC: 4.2 MIL/uL (ref 3.80–5.20)
RDW: 14.7 % — ABNORMAL HIGH (ref 11.5–14.5)
WBC: 5.2 10*3/uL (ref 3.6–11.0)

## 2016-12-04 MED ORDER — ALBUTEROL SULFATE HFA 108 (90 BASE) MCG/ACT IN AERS: 2.0000 | INHALATION_SPRAY | Freq: Four times a day (QID) | RESPIRATORY_TRACT | 2 refills | Status: DC | PRN

## 2016-12-04 MED ORDER — PREDNISONE 10 MG (21) PO TBPK: ORAL_TABLET | ORAL | 0 refills | Status: DC

## 2016-12-04 MED ORDER — HYDROCOD POLST-CPM POLST ER 10-8 MG/5ML PO SUER: 5.0000 mL | Freq: Two times a day (BID) | ORAL | 0 refills | Status: DC

## 2016-12-04 NOTE — Progress Notes (Signed)
Patient here today as add on for symptom management clinic. Patient reports worsening cough and shortness of breath over the last 4 days. Patient also reports worsening headaches and back pain x 2 weeks.

## 2016-12-04 NOTE — Progress Notes (Signed)
Radiation Oncology Follow up Note  Name: Yvette Riley   Date:   12/04/2016 MRN:  [id number] DOB: 07/13/1955    This 61 y.o. female presents to the clinic today for one-month follow-up status post prophylactic cranial irradiation for limited stage small cell lung cancer.  REFERRING PROVIDER: Birdie Sons, MD  HPI: Patient is a 61 year old female now 1 month out having completed whole brain radiation for prophylactic prevention of brain metastasis secondary to limited stage small cell lung cancer. Seen today in the clinic she is doing poorly she states she's having trouble breathing with a cough and headaches..  COMPLICATIONS OF TREATMENT: none  FOLLOW UP COMPLIANCE: keeps appointments   PHYSICAL EXAM:  BP (!) 148/93   Pulse (!) 103   Temp 97.6 F (36.4 C)   Resp (!) 22   Wt 162 lb 4.1 oz (73.6 kg)   SpO2 97%   BMI 33.15 kg/m  Frail-appearing female in moderate respiratory distress. Well-developed well-nourished patient in NAD. HEENT reveals PERLA, EOMI, discs not visualized.  Oral cavity is clear. No oral mucosal lesions are identified. Neck is clear without evidence of cervical or supraclavicular adenopathy. Lungs are clear to A&P. Cardiac examination is essentially unremarkable with regular rate and rhythm without murmur rub or thrill. Abdomen is benign with no organomegaly or masses noted. Motor sensory and DTR levels are equal and symmetric in the upper and lower extremities. Cranial nerves II through XII are grossly intact. Proprioception is intact. No peripheral adenopathy or edema is identified. No motor or sensory levels are noted. Crude visual fields are within normal range.  RADIOLOGY RESULTS: No current films for review  PLAN: An based on the patient's frail condition and complaints I have referred her to the triage nurse and medical oncology. Chest x-ray will be performed as well as blood work. I have asked to see her back in 4 months for follow-up. She was seen  emergently in the medical oncology clinic.  I would like to take this opportunity to thank you for allowing me to participate in the care of your patient.Armstead Peaks., MD

## 2016-12-04 NOTE — Progress Notes (Signed)
Symptom Management Consult note [facility]  Telephone:([phone] [phone]  Patient Care Team: Birdie Sons, MD as PCP - General (Family Medicine) Dionisio David, MD (Cardiology)   Name of the patient: Yvette Riley  [id number]  23-Jul-1955   Date of visit: 12/04/2016  Diagnosis- Clinical stage IIIB small cell lung cancer of the right lung.  Chief complaint/ Reason for visit- Cough/Congestion  Heme/Onc history: Clinical stage IIIB small cell lung cancer of the right lung: Patient completed concurrent chemotherapy and XRT at outside facility and then transferred care to [facility] to complete treatment and long-term monitoring for recurrence. She completed cycle 6 of carboplatinum and etoposide on August 15, 2016. PET scan results reviewed independently and reported as above with significant improvement of patient's disease burden.   Interval history- Patient was last seen by Dr. Grayland Ormond on 09/26/2016 where they discussed CT of chest results and was further evaluated. PET scan results reviewed independently and reported as above with significant improvement of patient's disease burden. No further intervention is needed at this time She continued to complain of chronic cough but denied any recent fevers or illnesses. She continued to have a good appetite and denied weight loss. She denied any neurological complaints.  Patient presents today with complaints of cough, shortness of breath, back pain and new onset headache for the past 4 days. She also notes a decrease in her appetite and small amounts of clear/green sputum in the morning. She denies sinus congestion. She admits to several fevers as high as 100.1. Her last fever was 2 days ago. She states she has a history of bronchitis and has been using her nebulizer and inhaler at home without relief. She is currently out of her pain medication but is due to be refilled in the next day or so. This is filled by  her PCP. She has tried Excedrin Migraine for her headache with minimal relief. This headache is constant and waxes and wanes. She states she noticed a "slight" headache after receiving brain radiation approximately one month ago. Over the past 4 days it has become progressively worse. Her back pain is better when ambulating and is worse in the morning when she first wakes up. She denies injury to her back. She has noticed a steep decline in her appetite since radiation approximately 1 month ago. She forces fluids daily and eat small meals. She states today she feels "horrible" and she just wants to go home and sleep.  ECOG FS:1 - Symptomatic but completely ambulatory  Review of systems- Review of Systems  Constitutional: Positive for chills, fever and malaise/fatigue.  HENT: Negative.   Eyes: Negative.   Respiratory: Positive for cough, sputum production, shortness of breath and wheezing.   Cardiovascular: Negative for chest pain and leg swelling.  Gastrointestinal: Negative for diarrhea, heartburn, nausea and vomiting.  Genitourinary: Negative.   Musculoskeletal: Positive for back pain.  Skin: Negative.   Neurological: Positive for weakness and headaches. Negative for dizziness and seizures.  Endo/Heme/Allergies: Negative.   Psychiatric/Behavioral: Negative.      Current treatment- No active treatment.   Allergies  Allergen Reactions  . Codeine Anaphylaxis  . Crestor [Rosuvastatin] Anaphylaxis  . Other Anaphylaxis  . Penicillins Anaphylaxis, Rash and Other (See Comments)    Reaction:  Tongue swelling  Has patient had a PCN reaction causing immediate rash, facial/tongue/throat swelling, SOB or lightheadedness with hypotension:  Yes   Has patient had a PCN reaction causing severe rash involving mucus  membranes or skin necrosis: No Has patient had a PCN reaction that required hospitalization No Has patient had a PCN reaction occurring within the last 10 years: No If all of the above  answers are "NO", then may proceed with Cephalosporin use.  . [place] Venom [Bee Venom] Anaphylaxis  . Gemfibrozil Rash, Nausea And Vomiting and Swelling  . Statins Nausea And Vomiting and Swelling  . Trazodone And Nefazodone Nausea And Vomiting  . Lipitor [Atorvastatin] Rash     Past Medical History:  Diagnosis Date  . Allergy   . Anemia   . Anxiety   . Arthritis   . Asthma   . Blood transfusion without reported diagnosis   . C. difficile diarrhea 04/07/2016  . Cancer ([place])    lung ca  . CAP (community acquired pneumonia) 03/21/2015  . COPD (chronic obstructive pulmonary disease) ([place])   . Diabetes mellitus without complication ([place])   . Displacement of lumbar intervertebral disc without myelopathy   . Emphysema of lung ([place])   . GERD (gastroesophageal reflux disease)   . Glaucoma   . History of chicken pox   . Hypertension   . Lung cancer ([place])   . Pneumonia 03/29/2015  . Shortness of breath      Past Surgical History:  Procedure Laterality Date  . CESAREAN SECTION    . TUBAL LIGATION      Social History   Socioeconomic History  . Marital status: Widowed    Spouse name: Not on file  . Number of children: Not on file  . Years of education: Not on file  . Highest education level: Not on file  Social Needs  . Financial resource strain: Not on file  . Food insecurity - worry: Not on file  . Food insecurity - inability: Not on file  . Transportation needs - medical: Not on file  . Transportation needs - non-medical: Not on file  Occupational History  . Not on file  Tobacco Use  . Smoking status: Former Smoker    Packs/day: 0.25    Years: 45.00    Pack years: 11.25    Types: Cigarettes    Last attempt to quit: 03/17/2016    Years since quitting: 0.7  . Smokeless tobacco: Never Used  . Tobacco comment: previously smoked 2 1/2 ppd for 30 years.   Substance and Sexual Activity  . Alcohol use: No    Alcohol/week: 0.0 oz  . Drug use: No  . Sexual  activity: Not on file  Other Topics Concern  . Not on file  Social History Narrative  . Not on file    Family History  Problem Relation Age of Onset  . Heart failure Mother   . Heart disease Mother   . Stroke Mother   . Heart disease Brother   . COPD Brother   . Cancer Maternal Aunt        Breast Cancer     Current Outpatient Medications:  .  albuterol (PROVENTIL HFA;VENTOLIN HFA) 108 (90 Base) MCG/ACT inhaler, Inhale 2 puffs into the lungs every 6 (six) hours as needed for wheezing or shortness of breath., Disp: 1 Inhaler, Rfl: 12 .  aspirin EC 81 MG tablet, Take 81 mg by mouth daily. , Disp: , Rfl:  .  clonazePAM (KLONOPIN) 0.5 MG tablet, Take 1 tablet (0.5 mg total) by mouth 2 (two) times daily as needed for anxiety., Disp: 60 tablet, Rfl: 3 .  dexamethasone (DECADRON) 4 MG tablet, Take 1 tablet (4  mg total) by mouth daily. Begin on 1st day of radiation, Disp: 25 tablet, Rfl: 0 .  dexamethasone (DECADRON) 4 MG tablet, Take 1 tablet (4 mg total) by mouth daily., Disp: 20 tablet, Rfl: 0 .  ferrous sulfate 325 (65 FE) MG tablet, Take 1 tablet (325 mg total) by mouth 2 (two) times daily with a meal., Disp: 30 tablet, Rfl: 3 .  glucose blood test strip, Check blood sugar daily. Dx Type 2 diabetes E11.9, Disp: 100 each, Rfl: 3 .  ipratropium-albuterol (DUONEB) 0.5-2.5 (3) MG/3ML SOLN, Take 3 mLs by nebulization every 6 (six) hours as needed., Disp: , Rfl:  .  ipratropium-albuterol (DUONEB) 0.5-2.5 (3) MG/3ML SOLN, Inhale 3 mLs into the lungs 4 (four) times daily as needed., Disp: , Rfl:  .  meclizine (ANTIVERT) 25 MG tablet, TAKE ONE TABLET BY MOUTH THREE TIMES DAILY AS NEEDED FOR DIZZINESS, Disp: 30 tablet, Rfl: 5 .  meloxicam (MOBIC) 15 MG tablet, Take 1 tablet (15 mg total) by mouth daily as needed for pain., Disp: 90 tablet, Rfl: 2 .  metFORMIN (GLUCOPHAGE XR) 500 MG 24 hr tablet, Take 2 tablets (1,000 mg total) by mouth daily with breakfast. With meals, Disp: 1 tablet, Rfl: 1 .   mirtazapine (REMERON) 15 MG tablet, Take 1 tablet (15 mg total) by mouth at bedtime., Disp: 30 tablet, Rfl: 3 .  montelukast (SINGULAIR) 10 MG tablet, Take 10 mg by mouth at bedtime., Disp: , Rfl:  .  Nutritional Supplements (GLUCERNA ADVANCE SHAKE) LIQD, Drink 1-2 shakes per day, Disp: 24 Bottle, Rfl: 12 .  oxyCODONE-acetaminophen (PERCOCET) 10-325 MG tablet, One tablet every 4-6 hours as needed, Disp: 180 tablet, Rfl: 0 .  prochlorperazine (COMPAZINE) 10 MG tablet, Take 1 tablet by mouth every 6 (six) hours as needed., Disp: , Rfl:  .  sucralfate (CARAFATE) 1 g tablet, Take 1 tablet (1 g total) by mouth 4 (four) times daily -  with meals and at bedtime., Disp: 120 tablet, Rfl: 4 .  umeclidinium-vilanterol (ANORO ELLIPTA) 62.5-25 MCG/INH AEPB, Inhale 1 puff into the lungs daily., Disp: 60 each, Rfl: 4 .  zolpidem (AMBIEN) 10 MG tablet, TAKE 1 TABLET BY MOUTH AT BEDTIME, Disp: 30 tablet, Rfl: 4 .  albuterol (PROVENTIL HFA;VENTOLIN HFA) 108 (90 Base) MCG/ACT inhaler, Inhale 2 puffs every 6 (six) hours as needed into the lungs for wheezing or shortness of breath., Disp: 1 Inhaler, Rfl: 2 .  chlorpheniramine-HYDROcodone (TUSSIONEX) 10-8 MG/5ML SUER, Take 5 mLs 2 (two) times daily by mouth., Disp: 140 mL, Rfl: 0 .  predniSONE (STERAPRED UNI-PAK 21 TAB) 10 MG (21) TBPK tablet, Take as prescribed., Disp: 21 tablet, Rfl: 0  Physical exam:  Vitals:   12/04/16 1102  Temp: (!) 97 F (36.1 C)  TempSrc: Tympanic  SpO2: 96%   Physical Exam  Constitutional: She is oriented to person, place, and time. Vital signs are normal. She has a sickly appearance. She appears distressed.  HENT:  Head: Normocephalic and atraumatic.  Eyes: Pupils are equal, round, and reactive to light.  Neck: Normal range of motion.  Cardiovascular: Regular rhythm. Tachycardia present.  Pulmonary/Chest: Effort normal. She has wheezes in the right upper field, the right lower field, the left upper field and the left lower field.    Abdominal: Soft. Normal appearance and bowel sounds are normal.  Musculoskeletal: Normal range of motion. She exhibits tenderness.       Lumbar back: She exhibits tenderness and pain.  Neurological: She is alert and oriented to person,  place, and time.  Skin: Skin is warm and dry.  Psychiatric: Mood, memory, affect and judgment normal.     CMP Latest Ref Rng & Units 12/04/2016  Glucose 65 - 99 mg/dL 190(H)  BUN 6 - 20 mg/dL 8  Creatinine 0.44 - 1.00 mg/dL 0.56  Sodium 135 - 145 mmol/L 138  Potassium 3.5 - 5.1 mmol/L 3.8  Chloride 101 - 111 mmol/L 107  CO2 22 - 32 mmol/L 23  Calcium 8.9 - 10.3 mg/dL 9.1  Total Protein 6.5 - 8.1 g/dL 7.8  Total Bilirubin 0.3 - 1.2 mg/dL 0.8  Alkaline Phos 38 - 126 U/L 79  AST 15 - 41 U/L 19  ALT 14 - 54 U/L 13(L)   CBC Latest Ref Rng & Units 12/04/2016  WBC 3.6 - 11.0 K/uL 5.2  Hemoglobin 12.0 - 16.0 g/dL 12.2  Hematocrit 35.0 - 47.0 % 36.2  Platelets 150 - 440 K/uL 282    No images are attached to the encounter.  Dg Chest 2 View  Result Date: 12/04/2016 CLINICAL DATA:  Wheezing and shortness of breath for the past 4 days. Lung cancer. EXAM: CHEST  2 VIEW COMPARISON:  PET-CT dated 09/24/2016. FINDINGS: Interval increased density and volume loss in the right upper lobe extending from the hilar region to the lateral subpleural region with a small amount of adjacent pleural thickening. Normal sized heart. Mild scoliosis. IMPRESSION: 1. Probable progressive postradiation changes and volume loss in the right upper lobe. Underlying bronchial obstruction cannot be excluded. 2. Mild bronchitic changes. Electronically Signed   By: Claudie Revering M.D.   On: 12/04/2016 12:37     Assessment and plan- Patient is a 61 y.o. female who presents with cough, shortness of breath, new onset headache and back pain for the past 4 days. On assessment, the patient is sitting in a wheelchair alone. She appears pale and disheveled. She is wearing a facemask due to her cough.  She is coughing throughout the exam. Lungs bilaterally are wheezy. No rhonchi present. She is tachycardic with a heart rate of 110. Blood pressure stable. She is afebrile. Oxygen saturations are 95% on room air. Labs drawn today are unremarkable.  1. Cough: RX Tussinex BID. Educated the patient on side effects of medication and to start taking this cough syrup only at nighttime. Continue nebulizer and inhaler as needed. 2. SOB/Wheezing bilaterally: Chest X-ray STAT. Chest Xray showed mild bronchitic changes and progressive postradiation changes. RX Prednisone Dose Pak Taper for 6 days. RX Ventolin Inhaler every 6 hours PRN. Since the patients lab work and chest x-ray are not indicative of infection, I will not prescribe antibiotic at this time. Educated the patient on signs and symptoms of infection and if she continued to have fevers she should follow-up with [place]. 3. New onset Headache: MRI of brain to R/O Mets. This is scheduled for 12/12/2016. 4. Lumbar back pain: Patient states she is due for refill on Percocet in the next couple days. This is refilled and prescribed by her PCP. 5. Decline in appetite: Encouraged frequent high protein meals. Encourage dietary supplements such as Ensure and Breeze. May need dietary consult if this does not improve. 6. RTC as scheduled to see Dr. Grayland Ormond on 01/02/2017. He is also scheduled to have a CT of the chest prior to seeing him in December.   Visit Diagnosis 1. Wheezing   2. New daily persistent headache   3. Cough   4. Lumbar back pain   5. Shortness of breath  Patient expressed understanding and was in agreement with this plan. She also understands that She can call clinic at any time with any questions, concerns, or complaints.   Greater than 50% was spent in counseling and coordination of care with this patient including but not limited to discussion of the relevant topics above (See A&P) including, but not limited to diagnosis and management of  acute and chronic medical conditions.    Faythe Casa, AGNP-C [facility]- [phone] Pager- [phone] 12/05/2016 9:27 AM

## 2016-12-05 ENCOUNTER — Ambulatory Visit: Payer: Medicare Other | Admitting: Radiation Oncology

## 2016-12-09 ENCOUNTER — Other Ambulatory Visit: Payer: Self-pay

## 2016-12-09 DIAGNOSIS — C3491 Malignant neoplasm of unspecified part of right bronchus or lung: Secondary | ICD-10-CM

## 2016-12-12 ENCOUNTER — Ambulatory Visit
Admission: RE | Admit: 2016-12-12 | Discharge: 2016-12-12 | Disposition: A | Discharge status: Home / Self Care | Payer: Medicare Other | Source: Ambulatory Visit | Attending: Oncology | Admitting: Oncology

## 2016-12-12 DIAGNOSIS — C3491 Malignant neoplasm of unspecified part of right bronchus or lung: Secondary | ICD-10-CM | POA: Diagnosis not present

## 2016-12-12 DIAGNOSIS — C3490 Malignant neoplasm of unspecified part of unspecified bronchus or lung: Secondary | ICD-10-CM | POA: Diagnosis not present

## 2016-12-12 MED ORDER — GADOBENATE DIMEGLUMINE 529 MG/ML IV SOLN
15.0000 mL | Freq: Once | INTRAVENOUS | Status: AC | PRN
  Administered 2016-12-12: 15 mL via INTRAVENOUS

## 2016-12-26 ENCOUNTER — Ambulatory Visit
Admission: RE | Admit: 2016-12-26 | Discharge: 2016-12-26 | Disposition: A | Discharge status: Home / Self Care | Payer: Medicare Other | Source: Ambulatory Visit | Attending: Oncology | Admitting: Oncology

## 2016-12-26 DIAGNOSIS — K229 Disease of esophagus, unspecified: Secondary | ICD-10-CM | POA: Insufficient documentation

## 2016-12-26 DIAGNOSIS — C3490 Malignant neoplasm of unspecified part of unspecified bronchus or lung: Secondary | ICD-10-CM | POA: Diagnosis not present

## 2016-12-26 DIAGNOSIS — Z923 Personal history of irradiation: Secondary | ICD-10-CM | POA: Insufficient documentation

## 2016-12-26 DIAGNOSIS — R918 Other nonspecific abnormal finding of lung field: Secondary | ICD-10-CM | POA: Insufficient documentation

## 2016-12-26 MED ORDER — IOPAMIDOL (ISOVUE-300) INJECTION 61%
75.0000 mL | Freq: Once | INTRAVENOUS | Status: AC | PRN
  Administered 2016-12-26: 75 mL via INTRAVENOUS

## 2016-12-28 ENCOUNTER — Other Ambulatory Visit: Payer: Self-pay | Admitting: Family Medicine

## 2016-12-28 ENCOUNTER — Telehealth: Payer: Self-pay | Admitting: Family Medicine

## 2016-12-28 MED ORDER — PREDNISONE 20 MG PO TABS: ORAL_TABLET | ORAL | 0 refills | Status: DC

## 2016-12-28 NOTE — Telephone Encounter (Signed)
Patient is calling with c/o SOB. Patient denies chest pain,No fever, dizziness,palpitations,jaw pain, shoulder pain. Per patient she is calling because she wants prednisone to be call in. Reports that Dr Caryn Section usually calls that in for her. Patient has COPD and lung cancer.Per patient she can't come to an appointment.   Please advise.  Thanks,  -Joseline

## 2016-12-28 NOTE — Telephone Encounter (Signed)
Discussed prednisone 20 mg.twice daily sent in to [address]. Reports increased s.o.b. Without purulent sputum.

## 2016-12-28 NOTE — Telephone Encounter (Signed)
Open in error

## 2016-12-30 ENCOUNTER — Other Ambulatory Visit: Payer: Self-pay | Admitting: Family Medicine

## 2016-12-30 MED ORDER — PREDNISONE 20 MG PO TABS: ORAL_TABLET | ORAL | 0 refills | Status: DC

## 2017-01-01 ENCOUNTER — Encounter: Payer: Self-pay | Admitting: Emergency Medicine

## 2017-01-01 ENCOUNTER — Other Ambulatory Visit: Payer: Self-pay

## 2017-01-01 ENCOUNTER — Emergency Department
Admission: EM | Admit: 2017-01-01 | Discharge: 2017-01-01 | Disposition: A | Discharge status: Home / Self Care | Payer: Medicare Other | Attending: Emergency Medicine | Admitting: Emergency Medicine

## 2017-01-01 DIAGNOSIS — R112 Nausea with vomiting, unspecified: Secondary | ICD-10-CM | POA: Insufficient documentation

## 2017-01-01 DIAGNOSIS — Z7984 Long term (current) use of oral hypoglycemic drugs: Secondary | ICD-10-CM | POA: Diagnosis not present

## 2017-01-01 DIAGNOSIS — F419 Anxiety disorder, unspecified: Secondary | ICD-10-CM | POA: Insufficient documentation

## 2017-01-01 DIAGNOSIS — J449 Chronic obstructive pulmonary disease, unspecified: Secondary | ICD-10-CM | POA: Insufficient documentation

## 2017-01-01 DIAGNOSIS — J45909 Unspecified asthma, uncomplicated: Secondary | ICD-10-CM | POA: Diagnosis not present

## 2017-01-01 DIAGNOSIS — Z79899 Other long term (current) drug therapy: Secondary | ICD-10-CM | POA: Diagnosis not present

## 2017-01-01 DIAGNOSIS — E119 Type 2 diabetes mellitus without complications: Secondary | ICD-10-CM | POA: Diagnosis not present

## 2017-01-01 DIAGNOSIS — R42 Dizziness and giddiness: Secondary | ICD-10-CM | POA: Diagnosis present

## 2017-01-01 DIAGNOSIS — Z85118 Personal history of other malignant neoplasm of bronchus and lung: Secondary | ICD-10-CM | POA: Diagnosis not present

## 2017-01-01 DIAGNOSIS — I2510 Atherosclerotic heart disease of native coronary artery without angina pectoris: Secondary | ICD-10-CM | POA: Insufficient documentation

## 2017-01-01 LAB — COMPREHENSIVE METABOLIC PANEL
ALBUMIN: 4.4 g/dL (ref 3.5–5.0)
ALT: 11 U/L — ABNORMAL LOW (ref 14–54)
ANION GAP: 10 (ref 5–15)
AST: 34 U/L (ref 15–41)
Alkaline Phosphatase: 45 U/L (ref 38–126)
BILIRUBIN TOTAL: 2 mg/dL — AB (ref 0.3–1.2)
BUN: 11 mg/dL (ref 6–20)
CHLORIDE: 103 mmol/L (ref 101–111)
CO2: 22 mmol/L (ref 22–32)
Calcium: 9.4 mg/dL (ref 8.9–10.3)
Creatinine, Ser: 0.71 mg/dL (ref 0.44–1.00)
GFR calc Af Amer: >60 mL/min (ref >60)
GFR calc non Af Amer: >60 mL/min (ref >60)
GLUCOSE: 145 mg/dL — AB (ref 65–99)
POTASSIUM: 4.3 mmol/L (ref 3.5–5.1)
SODIUM: 135 mmol/L (ref 135–145)
TOTAL PROTEIN: 7.9 g/dL (ref 6.5–8.1)

## 2017-01-01 LAB — URINALYSIS, COMPLETE (UACMP) WITH MICROSCOPIC
BACTERIA UA: NONE SEEN
BILIRUBIN URINE: NEGATIVE
Glucose, UA: NEGATIVE mg/dL
HGB URINE DIPSTICK: NEGATIVE
Ketones, ur: 20 mg/dL — AB
LEUKOCYTES UA: NEGATIVE
NITRITE: NEGATIVE
PH: 5 (ref 5.0–8.0)
Protein, ur: 30 mg/dL — AB
SPECIFIC GRAVITY, URINE: 1.026 (ref 1.005–1.030)

## 2017-01-01 LAB — CBC
HEMATOCRIT: 36.1 % (ref 35.0–47.0)
HEMOGLOBIN: 12.1 g/dL (ref 12.0–16.0)
MCH: 29.3 pg (ref 26.0–34.0)
MCHC: 33.6 g/dL (ref 32.0–36.0)
MCV: 87.2 fL (ref 80.0–100.0)
Platelets: 269 10*3/uL (ref 150–440)
RBC: 4.14 MIL/uL (ref 3.80–5.20)
RDW: 15.6 % — AB (ref 11.5–14.5)
WBC: 4.8 10*3/uL (ref 3.6–11.0)

## 2017-01-01 LAB — LIPASE, BLOOD: Lipase: 34 U/L (ref 11–51)

## 2017-01-01 MED ORDER — ONDANSETRON HCL 4 MG/2ML IJ SOLN
4.0000 mg | Freq: Once | INTRAMUSCULAR | Status: AC
  Administered 2017-01-01: 4 mg via INTRAVENOUS
  Filled 2017-01-01: qty 2

## 2017-01-01 MED ORDER — ONDANSETRON HCL 4 MG PO TABS: 4.0000 mg | ORAL_TABLET | Freq: Three times a day (TID) | ORAL | 0 refills | Status: DC | PRN

## 2017-01-01 MED ORDER — SODIUM CHLORIDE 0.9 % IV BOLUS (SEPSIS)
1000.0000 mL | Freq: Once | INTRAVENOUS | Status: AC
  Administered 2017-01-01: 1000 mL via INTRAVENOUS

## 2017-01-01 NOTE — Progress Notes (Deleted)
[place]  [phone] [phone]  ID: Yvette Riley OB: January 22, 1956  MR#: [id number]  AOZ#:[id number]  Patient Care Team: Birdie Sons, MD as PCP - General (Family Medicine) Dionisio David, MD (Cardiology)  CHIEF COMPLAINT: Clinical stage IIIB small cell lung cancer of the right lung.  INTERVAL HISTORY: Patient returns to clinic today for further evaluation and discussion of her imaging results. She continues to be anxious, but otherwise feels well. She has a chronic cough that is unchanged. She has no neurologic complaints. She denies any recent fevers or illnesses. She has a good appetite and denies weight loss. She has no chest pain, shortness of breath, or hemoptysis. She denies any nausea, vomiting, constipation, or diarrhea. She has no urinary complaints. Patient offers no further specific complaints.  REVIEW OF SYSTEMS:   Review of Systems  Constitutional: Negative.  Negative for fever, malaise/fatigue and weight loss.  Respiratory: Positive for cough. Negative for hemoptysis and shortness of breath.   Cardiovascular: Negative.  Negative for chest pain and leg swelling.  Gastrointestinal: Negative.  Negative for abdominal pain.  Genitourinary: Negative.   Musculoskeletal: Negative.   Skin: Negative.  Negative for rash.  Neurological: Negative.  Negative for sensory change and weakness.  Psychiatric/Behavioral: The patient is nervous/anxious.     As per HPI. Otherwise, a complete review of systems is negative.  PAST MEDICAL HISTORY: Past Medical History:  Diagnosis Date  . Allergy   . Anemia   . Anxiety   . Arthritis   . Asthma   . Blood transfusion without reported diagnosis   . C. difficile diarrhea 04/07/2016  . Cancer ([place])    lung ca  . CAP (community acquired pneumonia) 03/21/2015  . COPD (chronic obstructive pulmonary disease) ([place])   . Diabetes mellitus without complication ([place])   . Displacement of lumbar  intervertebral disc without myelopathy   . Emphysema of lung ([place])   . GERD (gastroesophageal reflux disease)   . Glaucoma   . History of chicken pox   . Hypertension   . Lung cancer ([place])   . Pneumonia 03/29/2015  . Shortness of breath     PAST SURGICAL HISTORY: Past Surgical History:  Procedure Laterality Date  . CESAREAN SECTION    . TUBAL LIGATION      FAMILY HISTORY: Family History  Problem Relation Age of Onset  . Heart failure Mother   . Heart disease Mother   . Stroke Mother   . Heart disease Brother   . COPD Brother   . Cancer Maternal Aunt        Breast Cancer    ADVANCED DIRECTIVES (Y/N):  N  HEALTH MAINTENANCE: Social History   Tobacco Use  . Smoking status: Former Smoker    Packs/day: 0.25    Years: 45.00    Pack years: 11.25    Types: Cigarettes    Last attempt to quit: 03/17/2016    Years since quitting: 0.7  . Smokeless tobacco: Never Used  . Tobacco comment: previously smoked 2 1/2 ppd for 30 years.   Substance Use Topics  . Alcohol use: No    Alcohol/week: 0.0 oz  . Drug use: No     Colonoscopy:  PAP:  Bone density:  Lipid panel:  Allergies  Allergen Reactions  . Codeine Anaphylaxis  . Crestor [Rosuvastatin] Anaphylaxis  . Other Anaphylaxis  . Penicillins Anaphylaxis, Rash and Other (See Comments)    Reaction:  Tongue swelling  Has patient had a PCN  reaction causing immediate rash, facial/tongue/throat swelling, SOB or lightheadedness with hypotension:  Yes   Has patient had a PCN reaction causing severe rash involving mucus membranes or skin necrosis: No Has patient had a PCN reaction that required hospitalization No Has patient had a PCN reaction occurring within the last 10 years: No If all of the above answers are "NO", then may proceed with Cephalosporin use.  . [place] Venom [Bee Venom] Anaphylaxis  . Gemfibrozil Rash, Nausea And Vomiting and Swelling  . Statins Nausea And Vomiting and Swelling  . Trazodone And  Nefazodone Nausea And Vomiting  . Lipitor [Atorvastatin] Rash    Current Outpatient Medications  Medication Sig Dispense Refill  . albuterol (PROVENTIL HFA;VENTOLIN HFA) 108 (90 Base) MCG/ACT inhaler Inhale 2 puffs every 6 (six) hours as needed into the lungs for wheezing or shortness of breath. 1 Inhaler 2  . aspirin EC 81 MG tablet Take 81 mg by mouth daily.     . clonazePAM (KLONOPIN) 0.5 MG tablet Take 1 tablet (0.5 mg total) by mouth 2 (two) times daily as needed for anxiety. 60 tablet 3  . dexamethasone (DECADRON) 4 MG tablet Take 1 tablet (4 mg total) by mouth daily. Begin on 1st day of radiation 25 tablet 0  . dexamethasone (DECADRON) 4 MG tablet Take 1 tablet (4 mg total) by mouth daily. 20 tablet 0  . ferrous sulfate 325 (65 FE) MG tablet Take 1 tablet (325 mg total) by mouth 2 (two) times daily with a meal. 30 tablet 3  . glucose blood test strip Check blood sugar daily. Dx Type 2 diabetes E11.9 100 each 3  . ipratropium-albuterol (DUONEB) 0.5-2.5 (3) MG/3ML SOLN Inhale 3 mLs into the lungs 4 (four) times daily as needed.    . meclizine (ANTIVERT) 25 MG tablet TAKE ONE TABLET BY MOUTH THREE TIMES DAILY AS NEEDED FOR DIZZINESS 30 tablet 5  . meloxicam (MOBIC) 15 MG tablet Take 1 tablet (15 mg total) by mouth daily as needed for pain. 90 tablet 2  . metFORMIN (GLUCOPHAGE XR) 500 MG 24 hr tablet Take 2 tablets (1,000 mg total) by mouth daily with breakfast. With meals 1 tablet 1  . mirtazapine (REMERON) 15 MG tablet Take 1 tablet (15 mg total) by mouth at bedtime. 30 tablet 3  . montelukast (SINGULAIR) 10 MG tablet Take 10 mg by mouth at bedtime.    . Nutritional Supplements (GLUCERNA ADVANCE SHAKE) LIQD Drink 1-2 shakes per day 24 Bottle 12  . ondansetron (ZOFRAN) 4 MG tablet Take 1 tablet (4 mg total) by mouth every 8 (eight) hours as needed for nausea or vomiting. 20 tablet 0  . oxyCODONE-acetaminophen (PERCOCET) 10-325 MG tablet One tablet every 4-6 hours as needed 180 tablet 0  .  predniSONE (DELTASONE) 20 MG tablet One pill twice daily for 5 days 10 tablet 0  . prochlorperazine (COMPAZINE) 10 MG tablet Take 1 tablet by mouth every 6 (six) hours as needed.    . sucralfate (CARAFATE) 1 g tablet Take 1 tablet (1 g total) by mouth 4 (four) times daily -  with meals and at bedtime. 120 tablet 4  . umeclidinium-vilanterol (ANORO ELLIPTA) 62.5-25 MCG/INH AEPB Inhale 1 puff into the lungs daily. 60 each 4  . zolpidem (AMBIEN) 10 MG tablet TAKE 1 TABLET BY MOUTH AT BEDTIME 30 tablet 4   No current facility-administered medications for this visit.     OBJECTIVE: There were no vitals filed for this visit.   There is no  height or weight on file to calculate BMI.    ECOG FS:0 - Asymptomatic  General: Well-developed, well-nourished, no acute distress. Eyes: Pink conjunctiva, anicteric sclera. Lungs: Clear to auscultation bilaterally. Heart: Regular rate and rhythm. No rubs, murmurs, or gallops. Abdomen: Soft, nontender, nondistended. No organomegaly noted, normoactive bowel sounds. Musculoskeletal: No edema, cyanosis, or clubbing. Neuro: Alert, answering all questions appropriately. Cranial nerves grossly intact. Skin: No rashes or petechiae noted. Psych: Normal affect.   LAB RESULTS:  Lab Results  Component Value Date   NA 135 01/01/2017   K 4.3 01/01/2017   CL 103 01/01/2017   CO2 22 01/01/2017   GLUCOSE 145 (H) 01/01/2017   BUN 11 01/01/2017   CREATININE 0.71 01/01/2017   CALCIUM 9.4 01/01/2017   PROT 7.9 01/01/2017   ALBUMIN 4.4 01/01/2017   AST 34 01/01/2017   ALT 11 (L) 01/01/2017   ALKPHOS 45 01/01/2017   BILITOT 2.0 (H) 01/01/2017   GFRNONAA >60 01/01/2017   GFRAA >60 01/01/2017    Lab Results  Component Value Date   WBC 4.8 01/01/2017   NEUTROABS 4.2 12/04/2016   HGB 12.1 01/01/2017   HCT 36.1 01/01/2017   MCV 87.2 01/01/2017   PLT 269 01/01/2017     STUDIES: Dg Chest 2 View  Result Date: 12/04/2016 CLINICAL DATA:  Wheezing and shortness  of breath for the past 4 days. Lung cancer. EXAM: CHEST  2 VIEW COMPARISON:  PET-CT dated 09/24/2016. FINDINGS: Interval increased density and volume loss in the right upper lobe extending from the hilar region to the lateral subpleural region with a small amount of adjacent pleural thickening. Normal sized heart. Mild scoliosis. IMPRESSION: 1. Probable progressive postradiation changes and volume loss in the right upper lobe. Underlying bronchial obstruction cannot be excluded. 2. Mild bronchitic changes. Electronically Signed   By: Claudie Revering M.D.   On: 12/04/2016 12:37   Ct Chest W Contrast  Result Date: 12/26/2016 CLINICAL DATA:  Small cell carcinoma. EXAM: CT CHEST WITH CONTRAST TECHNIQUE: Multidetector CT imaging of the chest was performed during intravenous contrast administration. CONTRAST:  41mL ISOVUE-300 IOPAMIDOL (ISOVUE-300) INJECTION 61% COMPARISON:  PET-CT 09/24/2016 FINDINGS: Cardiovascular: Normal heart size. No pericardial effusion. Calcification in the LAD coronary arteries. Mediastinum/Nodes: The trachea appears patent and is midline. Circumferential wall thickening involving the mid esophagus is identified and appears similar to the previous exam. No enlarged mediastinal or hilar adenopathy. Lungs/Pleura: Small amount of right pleural fluid is identified, slightly increased in volume from previous exam. Progressive masslike architectural distortion, fibrosis and volume loss within the paramediastinal right lung is identified compatible with changes due to external beam radiation. The right hilar mass measures 1.5 x 2.4 cm, image 46 of series 2. Previously 2.3 x 2.9 cm. Upper Abdomen: The adrenal glands appear within normal limits. No acute abnormality identified within the upper abdomen. Aortocaval node is prominent measuring 8 mm and is unchanged from previous exam. Musculoskeletal: Degenerative disc disease noted within the thoracic spine. IMPRESSION: 1. Progressive changes of external  beam radiation involving the right lung. 2. Index right perihilar lung mass has decreased in size from previous exam. 3. Persistent wall thickening involving the esophagus which may reflect changes secondary to external beam radiation. Electronically Signed   By: Kerby Moors M.D.   On: 12/26/2016 14:24   Mr Jeri Cos YB Contrast  Result Date: 12/12/2016 CLINICAL DATA:  Small cell lung cancer. No neurologic symptoms. Staging. EXAM: MRI HEAD WITHOUT AND WITH CONTRAST TECHNIQUE: Multiplanar, multiecho pulse sequences of  the brain and surrounding structures were obtained without and with intravenous contrast. CONTRAST:  15 cc MultiHance intravenous COMPARISON:  12/28/2015 FINDINGS: Brain: No enhancement or swelling to suggest metastatic disease. Age normal appearance of the brain. No infarct, hemorrhage, or hydrocephalus. Vascular: Major flow are preserved Skull and upper cervical spine: Negative for marrow lesion. Cervical facet arthropathy. Sinuses/Orbits: Negative IMPRESSION: Negative for metastatic disease. Electronically Signed   By: Monte Fantasia M.D.   On: 12/12/2016 13:12    ASSESSMENT: Clinical stage IIIB small cell lung cancer of the right lung.  PLAN:    1. Clinical stage IIIB small cell lung cancer of the right lung: Patient completed concurrent chemotherapy and XRT at outside facility and then transferred care to [facility] to complete treatment and long-term monitoring for recurrence. She completed cycle 6 of carboplatinum and etoposide on August 15, 2016. PET scan results reviewed independently and reported as above with significant improvement of patient's disease burden. No further intervention is needed at this time. Return to clinic in 3 months with repeat imaging using CT scan and further evaluation. Previously, a referral was made to radiation oncology at that time for consideration of PCI.  2. Anemia: Mild, likely secondary to chemotherapy. Monitor.  Approximately 30 minutes was spent in  discussion of which greater than 50% was consultation.  Patient expressed understanding and was in agreement with this plan. She also understands that She can call clinic at any time with any questions, concerns, or complaints.   Cancer Staging Small cell lung cancer, right [facility]) Staging form: Lung, AJCC 8th Edition - Clinical stage from 08/18/2016: Stage IIIB (cT2a, cN3, cM0) - Signed by Lloyd Huger, MD on 08/18/2016   Lloyd Huger, MD   01/01/2017 3:04 PM

## 2017-01-01 NOTE — ED Triage Notes (Addendum)
Pt arrived via POV from home. Pt reports chronic back pain x 4 years. Pt reports N/V x 3 days.  Pt vomited x2 since 6am.  Pt reports minimal diarrhea.   Pt has hx of lung CA but has completed treatment and is in remission.

## 2017-01-01 NOTE — Discharge Instructions (Signed)
Please seek medical attention for any high fevers, chest pain, shortness of breath, change in behavior, persistent vomiting, bloody stool or any other new or concerning symptoms.  

## 2017-01-01 NOTE — ED Notes (Signed)
Pt walked to bathroom independently, refused to toilet in room.  Pt given cup for urine sample. Pt also given cup of water.

## 2017-01-01 NOTE — ED Provider Notes (Signed)
[facility] Emergency Department Provider Note   ____________________________________________   I have reviewed the triage vital signs and the nursing notes.   HISTORY  Chief Complaint Nausea; Emesis; and Back Pain   History limited by: Not Limited   HPI Yvette Riley is a 61 y.o. female who presents to the emergency department today because of concern for dizziness and nausea.   DURATION:3-4 days for nausea, states dizziness started yesterday TIMING: constant SEVERITY: severe QUALITY: light headed  CONTEXT: patient states she has not been feeling well for the past few days. Then developed lightheadedness. Had a couple of falls. Denies any known sick contacts. Denies any unusual ingestions. MODIFYING FACTORS: has not tried any medication for the nausea ASSOCIATED SYMPTOMS: denies any fevers. Did have some diarrhea.   Per medical record review patient has a history of lung CA.  Past Medical History:  Diagnosis Date  . Allergy   . Anemia   . Anxiety   . Arthritis   . Asthma   . Blood transfusion without reported diagnosis   . C. difficile diarrhea 04/07/2016  . Cancer ([place])    lung ca  . CAP (community acquired pneumonia) 03/21/2015  . COPD (chronic obstructive pulmonary disease) ([place])   . Diabetes mellitus without complication ([place])   . Displacement of lumbar intervertebral disc without myelopathy   . Emphysema of lung ([place])   . GERD (gastroesophageal reflux disease)   . Glaucoma   . History of chicken pox   . Hypertension   . Lung cancer ([place])   . Pneumonia 03/29/2015  . Shortness of breath     Patient Active Problem List   Diagnosis Date Noted  . Anxiety 07/19/2016  . Small cell lung cancer, right ([place]) 05/13/2016  . Chemoprophylaxis 04/29/2016  . Severe obesity (BMI 35.0-39.9) with comorbidity ([place]) 04/16/2016  . Elevated d-dimer 04/06/2016  . Weakness generalized 04/06/2016  . Sepsis ([place]) 03/17/2016  . Mass of upper lobe of  right lung 03/01/2016  . Coronary atherosclerosis 02/29/2016  . Personal history of tobacco use, presenting hazards to health 02/28/2016  . Allergic rhinitis 09/27/2014  . Asthma 09/27/2014  . Anemia 09/27/2014  . Hyperglyceridemia, pure 09/27/2014  . Glaucoma 09/27/2014  . Hip pain 09/27/2014  . Right knee pain 09/27/2014  . Displacement of lumbar intervertebral disc without myelopathy 09/27/2014  . Thoracic back pain 09/27/2014  . Insomnia 08/11/2014  . COPD (chronic obstructive pulmonary disease) ([place]) 06/12/2014  . Type 2 diabetes mellitus ([place]) 06/12/2014  . Back pain 06/12/2014  . Essential hypertension 03/28/2014  . Smoking greater than 30 pack years 03/28/2014    Past Surgical History:  Procedure Laterality Date  . CESAREAN SECTION    . TUBAL LIGATION      Prior to Admission medications   Medication Sig Start Date End Date Taking? Authorizing Provider  albuterol (PROVENTIL HFA;VENTOLIN HFA) 108 (90 Base) MCG/ACT inhaler Inhale 2 puffs into the lungs every 6 (six) hours as needed for wheezing or shortness of breath. 08/23/16   Birdie Sons, MD  albuterol (PROVENTIL HFA;VENTOLIN HFA) 108 (90 Base) MCG/ACT inhaler Inhale 2 puffs every 6 (six) hours as needed into the lungs for wheezing or shortness of breath. 12/04/16   Jacquelin Hawking, NP  aspirin EC 81 MG tablet Take 81 mg by mouth daily.     [provider]  chlorpheniramine-HYDROcodone (TUSSIONEX) 10-8 MG/5ML SUER Take 5 mLs 2 (two) times daily by mouth. 12/04/16   Jacquelin Hawking, NP  clonazePAM (KLONOPIN) 0.5 MG tablet Take 1 tablet (0.5 mg total) by mouth 2 (two) times daily as needed for anxiety. 08/14/16   Birdie Sons, MD  dexamethasone (DECADRON) 4 MG tablet Take 1 tablet (4 mg total) by mouth daily. Begin on 1st day of radiation 10/11/16   Noreene Filbert, MD  dexamethasone (DECADRON) 4 MG tablet Take 1 tablet (4 mg total) by mouth daily. 10/29/16   Noreene Filbert, MD  ferrous sulfate 325 (65 FE) MG  tablet Take 1 tablet (325 mg total) by mouth 2 (two) times daily with a meal. 03/20/16   Vaughan Basta, MD  glucose blood test strip Check blood sugar daily. Dx Type 2 diabetes E11.9 06/07/15   Birdie Sons, MD  ipratropium-albuterol (DUONEB) 0.5-2.5 (3) MG/3ML SOLN Take 3 mLs by nebulization every 6 (six) hours as needed.    [provider]  ipratropium-albuterol (DUONEB) 0.5-2.5 (3) MG/3ML SOLN Inhale 3 mLs into the lungs 4 (four) times daily as needed. 09/27/16 09/22/17  [provider]  meclizine (ANTIVERT) 25 MG tablet TAKE ONE TABLET BY MOUTH THREE TIMES DAILY AS NEEDED FOR DIZZINESS 02/16/16   Birdie Sons, MD  meloxicam (MOBIC) 15 MG tablet Take 1 tablet (15 mg total) by mouth daily as needed for pain. 08/05/16   Birdie Sons, MD  metFORMIN (GLUCOPHAGE XR) 500 MG 24 hr tablet Take 2 tablets (1,000 mg total) by mouth daily with breakfast. With meals 05/29/16   Birdie Sons, MD  mirtazapine (REMERON) 15 MG tablet Take 1 tablet (15 mg total) by mouth at bedtime. 05/29/16   Birdie Sons, MD  montelukast (SINGULAIR) 10 MG tablet Take 10 mg by mouth at bedtime.    [provider]  Nutritional Supplements ([place]) LIQD Drink 1-2 shakes per day 05/01/16   Mar Daring, PA-C  oxyCODONE-acetaminophen [facility]) 10-325 MG tablet One tablet every 4-6 hours as needed 11/05/16   Birdie Sons, MD  predniSONE (DELTASONE) 20 MG tablet One pill twice daily for 5 days 12/30/16   Carmon Ginsberg, PA  prochlorperazine (COMPAZINE) 10 MG tablet Take 1 tablet by mouth every 6 (six) hours as needed. 05/29/16   [provider]  sucralfate (CARAFATE) 1 g tablet Take 1 tablet (1 g total) by mouth 4 (four) times daily -  with meals and at bedtime. 10/30/16   Noreene Filbert, MD  umeclidinium-vilanterol (ANORO ELLIPTA) 62.5-25 MCG/INH AEPB Inhale 1 puff into the lungs daily. 03/27/16   Laverle Hobby, MD  zolpidem (AMBIEN) 10 MG tablet TAKE 1  TABLET BY MOUTH AT BEDTIME 09/30/16   Birdie Sons, MD    Allergies Codeine; Crestor [rosuvastatin]; Other; Penicillins; Yellow jacket venom [bee venom]; Gemfibrozil; Statins; Trazodone and nefazodone; and Lipitor [atorvastatin]  Family History  Problem Relation Age of Onset  . Heart failure Mother   . Heart disease Mother   . Stroke Mother   . Heart disease Brother   . COPD Brother   . Cancer Maternal Aunt        Breast Cancer    Social History Social History   Tobacco Use  . Smoking status: Former Smoker    Packs/day: 0.25    Years: 45.00    Pack years: 11.25    Types: Cigarettes    Last attempt to quit: 03/17/2016    Years since quitting: 0.7  . Smokeless tobacco: Never Used  . Tobacco comment: previously smoked 2 1/2 ppd for 30 years.   Substance Use Topics  .  Alcohol use: No    Alcohol/week: 0.0 oz  . Drug use: No    Review of Systems Constitutional: No fever/chills Eyes: No visual changes. ENT: No sore throat. Cardiovascular: Denies chest pain. Respiratory: Denies shortness of breath. Gastrointestinal: Positive for nausea. Positive for diarrhea.  Genitourinary: Negative for dysuria. Musculoskeletal: Negative for back pain. Skin: Negative for rash. Neurological: Positive for lightheadedness.  ____________________________________________   PHYSICAL EXAM:  VITAL SIGNS: ED Triage Vitals  Enc Vitals Group     BP 01/01/17 0824 113/62     Pulse Rate 01/01/17 0824 94     Resp 01/01/17 0824 20     Temp 01/01/17 0824 98.3 F (36.8 C)     Temp Source 01/01/17 0824 Oral     SpO2 01/01/17 0824 95 %     Weight 01/01/17 0824 160 lb (72.6 kg)     Height 01/01/17 0824 4\' 11"  (1.499 m)     Head Circumference --      Peak Flow --      Pain Score 01/01/17 0831 7   Constitutional: Alert and oriented. Well appearing and in no distress. Eyes: Conjunctivae are normal.  ENT   Head: Normocephalic and atraumatic.   Nose: No congestion/rhinnorhea.    Mouth/Throat: Mucous membranes are moist.   Neck: No stridor. Hematological/Lymphatic/Immunilogical: No cervical lymphadenopathy. Cardiovascular: Normal rate, regular rhythm.  No murmurs, rubs, or gallops.  Respiratory: Normal respiratory effort without tachypnea nor retractions. Breath sounds are clear and equal bilaterally. No wheezes/rales/rhonchi. Gastrointestinal: Soft and non tender. No rebound. No guarding.  Genitourinary: Deferred Musculoskeletal: Normal range of motion in all extremities. No lower extremity edema. Neurologic:  Normal speech and language. No gross focal neurologic deficits are appreciated.  Skin:  Skin is warm, dry and intact. No rash noted. Psychiatric: Mood and affect are normal. Speech and behavior are normal. Patient exhibits appropriate insight and judgment.  ____________________________________________    LABS (pertinent positives/negatives)  CBC wnl except rdw 15.6  ____________________________________________   EKG  None  ____________________________________________    RADIOLOGY  None  ____________________________________________   PROCEDURES  Procedures  ____________________________________________   INITIAL IMPRESSION / ASSESSMENT AND PLAN / ED COURSE  Pertinent labs & imaging results that were available during my care of the patient were reviewed by me and considered in my medical decision making (see chart for details).  Patient presents because of concern for nausea and vomiting. Blood work without any concerning findings for infection. No concerning findings for dehydration on BMP. Think likely patient either with viral illness, or could be reaction to pain medication. Given reassuring exam and blood work feel patient is safe for outpatient follow up. Discussed findings and plan with patient.  ____________________________________________   FINAL CLINICAL IMPRESSION(S) / ED DIAGNOSES  Final diagnoses:  Nausea and  vomiting, intractability of vomiting not specified, unspecified vomiting type     Note: This dictation was prepared with Dragon dictation. Any transcriptional errors that result from this process are unintentional     Nance Pear, MD 01/01/17 1143

## 2017-01-01 NOTE — ED Notes (Signed)
Pt asking if she is going to be admitted and wants to speak to the doctor.  Informed Dr. Archie Balboa.

## 2017-01-01 NOTE — ED Notes (Signed)
Dr. Goodman at bedside.  

## 2017-01-02 ENCOUNTER — Inpatient Hospital Stay: Payer: Medicare Other | Admitting: Oncology

## 2017-01-02 ENCOUNTER — Encounter: Payer: Self-pay | Admitting: Oncology

## 2017-01-02 NOTE — Progress Notes (Signed)
Patient: Yvette Riley Female    DOB: September 05, 1955   61 y.o.   MRN: [id number] Visit Date: 01/02/2017  Today's Provider: Lelon Huh, MD   Chief Complaint  Patient presents with  . Follow-up   Subjective:    HPI   Follow up ER visit  Patient was seen in ER for nausea and vomiting on 01/01/2017. She was treated for nausea and vomiting. Treatment for this included; labs including u/a cbc, met c lipase which were unremarkable except for ketones and protein in urine and mildly elevated bilirubin She reports good compliance with treatment. She reports this condition is Unchanged. Patient still has nausea and abdominal pain.   States she has been having pain bilateral flank pain radiating to right abdomen for a few weeks. Had been having nausea and vomiting and given prescription for Zofran which she took today and not having any nausea.   Pain seems to occur mostly in the morning, which she did have today, but pain resolved while eating breakfast.    ------------------------------------------------------------------------------------    Allergies  Allergen Reactions  . Codeine Anaphylaxis  . Crestor [Rosuvastatin] Anaphylaxis  . Other Anaphylaxis  . Penicillins Anaphylaxis, Rash and Other (See Comments)    Reaction:  Tongue swelling  Has patient had a PCN reaction causing immediate rash, facial/tongue/throat swelling, SOB or lightheadedness with hypotension:  Yes   Has patient had a PCN reaction causing severe rash involving mucus membranes or skin necrosis: No Has patient had a PCN reaction that required hospitalization No Has patient had a PCN reaction occurring within the last 10 years: No If all of the above answers are "NO", then may proceed with Cephalosporin use.  . Yellow Jacket Venom [Bee Venom] Anaphylaxis  . Gemfibrozil Rash, Nausea And Vomiting and Swelling  . Statins Nausea And Vomiting and Swelling  . Trazodone And Nefazodone Nausea And Vomiting  .  Lipitor [Atorvastatin] Rash     Current Outpatient Medications:  .  albuterol (PROVENTIL HFA;VENTOLIN HFA) 108 (90 Base) MCG/ACT inhaler, Inhale 2 puffs every 6 (six) hours as needed into the lungs for wheezing or shortness of breath., Disp: 1 Inhaler, Rfl: 2 .  aspirin EC 81 MG tablet, Take 81 mg by mouth daily. , Disp: , Rfl:  .  clonazePAM (KLONOPIN) 0.5 MG tablet, Take 1 tablet (0.5 mg total) by mouth 2 (two) times daily as needed for anxiety., Disp: 60 tablet, Rfl: 3 .  dexamethasone (DECADRON) 4 MG tablet, Take 1 tablet (4 mg total) by mouth daily., Disp: 20 tablet, Rfl: 0 .  ferrous sulfate 325 (65 FE) MG tablet, Take 1 tablet (325 mg total) by mouth 2 (two) times daily with a meal., Disp: 30 tablet, Rfl: 3 .  glucose blood test strip, Check blood sugar daily. Dx Type 2 diabetes E11.9, Disp: 100 each, Rfl: 3 .  ipratropium-albuterol (DUONEB) 0.5-2.5 (3) MG/3ML SOLN, Inhale 3 mLs into the lungs 4 (four) times daily as needed., Disp: , Rfl:  .  meclizine (ANTIVERT) 25 MG tablet, TAKE ONE TABLET BY MOUTH THREE TIMES DAILY AS NEEDED FOR DIZZINESS, Disp: 30 tablet, Rfl: 5 .  meloxicam (MOBIC) 15 MG tablet, Take 1 tablet (15 mg total) by mouth daily as needed for pain., Disp: 90 tablet, Rfl: 2 .  metFORMIN (GLUCOPHAGE XR) 500 MG 24 hr tablet, Take 2 tablets (1,000 mg total) by mouth daily with breakfast. With meals, Disp: 1 tablet, Rfl: 1 .  mirtazapine (REMERON) 15 MG tablet, Take 1  tablet (15 mg total) by mouth at bedtime., Disp: 30 tablet, Rfl: 3 .  montelukast (SINGULAIR) 10 MG tablet, Take 10 mg by mouth at bedtime., Disp: , Rfl:  .  Nutritional Supplements (GLUCERNA ADVANCE SHAKE) LIQD, Drink 1-2 shakes per day, Disp: 24 Bottle, Rfl: 12 .  ondansetron (ZOFRAN) 4 MG tablet, Take 1 tablet (4 mg total) by mouth every 8 (eight) hours as needed for nausea or vomiting., Disp: 20 tablet, Rfl: 0 .  oxyCODONE-acetaminophen (PERCOCET) 10-325 MG tablet, One tablet every 4-6 hours as needed, Disp: 180  tablet, Rfl: 0 .  sucralfate (CARAFATE) 1 g tablet, Take 1 tablet (1 g total) by mouth 4 (four) times daily -  with meals and at bedtime., Disp: 120 tablet, Rfl: 4 .  umeclidinium-vilanterol (ANORO ELLIPTA) 62.5-25 MCG/INH AEPB, Inhale 1 puff into the lungs daily., Disp: 60 each, Rfl: 4 .  zolpidem (AMBIEN) 10 MG tablet, TAKE 1 TABLET BY MOUTH AT BEDTIME, Disp: 30 tablet, Rfl: 4 .  dexamethasone (DECADRON) 4 MG tablet, Take 1 tablet (4 mg total) by mouth daily. Begin on 1st day of radiation, Disp: 25 tablet, Rfl: 0 .  predniSONE (DELTASONE) 20 MG tablet, One pill twice daily for 5 days, Disp: 10 tablet, Rfl: 0 .  prochlorperazine (COMPAZINE) 10 MG tablet, Take 1 tablet by mouth every 6 (six) hours as needed., Disp: , Rfl:   Review of Systems  Constitutional: Positive for appetite change and fatigue. Negative for chills, diaphoresis and fever.  Respiratory: Negative for chest tightness and shortness of breath.   Cardiovascular: Negative for chest pain and palpitations.  Gastrointestinal: Positive for abdominal pain and nausea. Negative for vomiting.  Neurological: Negative for dizziness and weakness.    Social History   Tobacco Use  . Smoking status: Former Smoker    Packs/day: 0.25    Years: 45.00    Pack years: 11.25    Types: Cigarettes    Last attempt to quit: 03/17/2016    Years since quitting: 0.8  . Smokeless tobacco: Never Used  . Tobacco comment: previously smoked 2 1/2 ppd for 30 years.   Substance Use Topics  . Alcohol use: No    Alcohol/week: 0.0 oz   Objective:   BP 104/60 (BP Location: Right Arm, Patient Position: Sitting, Cuff Size: Normal)   Pulse 90   Temp 98.6 F (37 C) (Oral)   Resp 18   Wt 159 lb (72.1 kg)   SpO2 95% Comment: room air  BMI 32.11 kg/m  There were no vitals filed for this visit.   Physical Exam  General Appearance:    Alert, cooperative, no distress  Eyes:    PERRL, conjunctiva/corneas clear, EOM's intact       Lungs:     Clear to  auscultation bilaterally, respirations unlabored  Heart:    Regular rate and rhythm  Abdomen:   bowel sounds present and normal in all 4 quadrants, soft or round. No CVA tenderness. Diffuse upper abdominal tenderness.         Assessment & Plan:     1. Generalized abdominal pain  - Hepatic function panel - CBC - US Abdomen Complete; Future  2. Total bilirubin, elevated  - Hepatic function panel - CBC - US Abdomen Complete; Future  Go to ER if any new symptoms, worsening pain, nausea, or vomiting.     The entirety of the information documented in the History of Present Illness, Review of Systems and Physical Exam were personally obtained by me. Portions  of this information were initially documented by Meyer Cory, CMA and reviewed by me for thoroughness and accuracy.    Lelon Huh, MD  [facility]

## 2017-01-03 ENCOUNTER — Encounter: Payer: Self-pay | Admitting: Family Medicine

## 2017-01-03 ENCOUNTER — Ambulatory Visit (INDEPENDENT_AMBULATORY_CARE_PROVIDER_SITE_OTHER): Payer: Medicare Other | Admitting: Family Medicine

## 2017-01-03 VITALS — BP 104/60 | HR 90 | Temp 98.6°F | Resp 18 | Wt 159.0 lb

## 2017-01-03 DIAGNOSIS — R17 Unspecified jaundice: Secondary | ICD-10-CM

## 2017-01-03 DIAGNOSIS — R1084 Generalized abdominal pain: Secondary | ICD-10-CM

## 2017-01-03 NOTE — Patient Instructions (Signed)
Try OTC topical Zostrix cream to help with arthritis pain

## 2017-01-10 ENCOUNTER — Ambulatory Visit
Admission: RE | Admit: 2017-01-10 | Discharge: 2017-01-10 | Disposition: A | Discharge status: Home / Self Care | Payer: Medicare Other | Source: Ambulatory Visit | Attending: Family Medicine | Admitting: Family Medicine

## 2017-01-10 DIAGNOSIS — K838 Other specified diseases of biliary tract: Secondary | ICD-10-CM | POA: Insufficient documentation

## 2017-01-10 DIAGNOSIS — R1084 Generalized abdominal pain: Secondary | ICD-10-CM | POA: Diagnosis present

## 2017-01-10 DIAGNOSIS — K828 Other specified diseases of gallbladder: Secondary | ICD-10-CM | POA: Diagnosis not present

## 2017-01-10 DIAGNOSIS — R17 Unspecified jaundice: Secondary | ICD-10-CM | POA: Diagnosis present

## 2017-01-14 NOTE — Progress Notes (Signed)
[facility]  [phone] [phone]  ID: Yvette Riley OB: 07/28/1955  MR#: [id number]  OIZ#:[id number]  Patient Care Team: Birdie Sons, MD as PCP - General (Family Medicine) Dionisio David, MD (Cardiology)  CHIEF COMPLAINT: Clinical stage IIIB small cell lung cancer of the right lung.  INTERVAL HISTORY: Patient returns to clinic today for further evaluation and discussion of her imaging results. She continues to be anxious, but otherwise feels well. She has a chronic cough that is unchanged. She has no neurologic complaints. She denies any recent fevers or illnesses. She has a good appetite and denies weight loss. She has no chest pain, shortness of breath, or hemoptysis. She denies any nausea, vomiting, constipation, or diarrhea. She has no urinary complaints. Patient offers no further specific complaints.  REVIEW OF SYSTEMS:   Review of Systems  Constitutional: Negative.  Negative for fever, malaise/fatigue and weight loss.  Respiratory: Positive for cough. Negative for hemoptysis and shortness of breath.   Cardiovascular: Negative.  Negative for chest pain and leg swelling.  Gastrointestinal: Negative.  Negative for abdominal pain.  Genitourinary: Negative.   Musculoskeletal: Negative.   Skin: Negative.  Negative for rash.  Neurological: Negative.  Negative for sensory change and weakness.  Psychiatric/Behavioral: The patient is nervous/anxious.     As per HPI. Otherwise, a complete review of systems is negative.  PAST MEDICAL HISTORY: Past Medical History:  Diagnosis Date  . Allergy   . Anemia   . Anxiety   . Arthritis   . Asthma   . Blood transfusion without reported diagnosis   . C. difficile diarrhea 04/07/2016  . Cancer ([place])    lung ca  . CAP (community acquired pneumonia) 03/21/2015  . COPD (chronic obstructive pulmonary disease) ([place])   . Diabetes mellitus without complication ([place])   . Displacement of lumbar  intervertebral disc without myelopathy   . Emphysema of lung ([place])   . GERD (gastroesophageal reflux disease)   . Glaucoma   . History of chicken pox   . Hypertension   . Lung cancer ([place])   . Pneumonia 03/29/2015  . Shortness of breath     PAST SURGICAL HISTORY: Past Surgical History:  Procedure Laterality Date  . CESAREAN SECTION    . TUBAL LIGATION      FAMILY HISTORY: Family History  Problem Relation Age of Onset  . Heart failure Mother   . Heart disease Mother   . Stroke Mother   . Heart disease Brother   . COPD Brother   . Cancer Maternal Aunt        Breast Cancer    ADVANCED DIRECTIVES (Y/N):  N  HEALTH MAINTENANCE: Social History   Tobacco Use  . Smoking status: Former Smoker    Packs/day: 0.25    Years: 45.00    Pack years: 11.25    Types: Cigarettes    Last attempt to quit: 03/17/2016    Years since quitting: 0.8  . Smokeless tobacco: Never Used  . Tobacco comment: previously smoked 2 1/2 ppd for 30 years.   Substance Use Topics  . Alcohol use: No    Alcohol/week: 0.0 oz  . Drug use: No     Colonoscopy:  PAP:  Bone density:  Lipid panel:  Allergies  Allergen Reactions  . Codeine Anaphylaxis  . Crestor [Rosuvastatin] Anaphylaxis  . Other Anaphylaxis  . Penicillins Anaphylaxis, Rash and Other (See Comments)    Reaction:  Tongue swelling  Has patient had a PCN  reaction causing immediate rash, facial/tongue/throat swelling, SOB or lightheadedness with hypotension:  Yes   Has patient had a PCN reaction causing severe rash involving mucus membranes or skin necrosis: No Has patient had a PCN reaction that required hospitalization No Has patient had a PCN reaction occurring within the last 10 years: No If all of the above answers are "NO", then may proceed with Cephalosporin use.  . Yellow Jacket Venom [Bee Venom] Anaphylaxis  . Gemfibrozil Rash, Nausea And Vomiting and Swelling  . Statins Nausea And Vomiting and Swelling  . Trazodone And  Nefazodone Nausea And Vomiting  . Lipitor [Atorvastatin] Rash    Current Outpatient Medications  Medication Sig Dispense Refill  . albuterol (PROVENTIL HFA;VENTOLIN HFA) 108 (90 Base) MCG/ACT inhaler Inhale 2 puffs every 6 (six) hours as needed into the lungs for wheezing or shortness of breath. 1 Inhaler 2  . aspirin EC 81 MG tablet Take 81 mg by mouth daily.     . clonazePAM (KLONOPIN) 0.5 MG tablet Take 1 tablet (0.5 mg total) by mouth 2 (two) times daily as needed for anxiety. 60 tablet 3  . glucose blood test strip Check blood sugar daily. Dx Type 2 diabetes E11.9 100 each 3  . ipratropium-albuterol (DUONEB) 0.5-2.5 (3) MG/3ML SOLN Inhale 3 mLs into the lungs 4 (four) times daily as needed.    . metFORMIN (GLUCOPHAGE XR) 500 MG 24 hr tablet Take 2 tablets (1,000 mg total) by mouth daily with breakfast. With meals 1 tablet 1  . mirtazapine (REMERON) 15 MG tablet Take 1 tablet (15 mg total) by mouth at bedtime. 30 tablet 3  . montelukast (SINGULAIR) 10 MG tablet Take 10 mg by mouth at bedtime.    . Nutritional Supplements (GLUCERNA ADVANCE SHAKE) LIQD Drink 1-2 shakes per day 24 Bottle 12  . ondansetron (ZOFRAN) 4 MG tablet Take 1 tablet (4 mg total) by mouth every 8 (eight) hours as needed for nausea or vomiting. 20 tablet 0  . oxyCODONE-acetaminophen (PERCOCET) 10-325 MG tablet One tablet every 4-6 hours as needed 180 tablet 0  . zolpidem (AMBIEN) 10 MG tablet TAKE 1 TABLET BY MOUTH AT BEDTIME 30 tablet 4  . dexamethasone (DECADRON) 4 MG tablet Take 1 tablet (4 mg total) by mouth daily. Begin on 1st day of radiation (Patient not taking: Reported on 01/16/2017) 25 tablet 0  . dexamethasone (DECADRON) 4 MG tablet Take 1 tablet (4 mg total) by mouth daily. (Patient not taking: Reported on 01/16/2017) 20 tablet 0  . ferrous sulfate 325 (65 FE) MG tablet Take 1 tablet (325 mg total) by mouth 2 (two) times daily with a meal. (Patient not taking: Reported on 01/16/2017) 30 tablet 3  . meclizine  (ANTIVERT) 25 MG tablet TAKE ONE TABLET BY MOUTH THREE TIMES DAILY AS NEEDED FOR DIZZINESS (Patient not taking: Reported on 01/16/2017) 30 tablet 5  . meloxicam (MOBIC) 15 MG tablet Take 1 tablet (15 mg total) by mouth daily as needed for pain. (Patient not taking: Reported on 01/16/2017) 90 tablet 2  . predniSONE (DELTASONE) 20 MG tablet One pill twice daily for 5 days (Patient not taking: Reported on 01/16/2017) 10 tablet 0  . prochlorperazine (COMPAZINE) 10 MG tablet Take 1 tablet by mouth every 6 (six) hours as needed.    . sucralfate (CARAFATE) 1 g tablet Take 1 tablet (1 g total) by mouth 4 (four) times daily -  with meals and at bedtime. (Patient not taking: Reported on 01/16/2017) 120 tablet 4  . umeclidinium-vilanterol (  ANORO ELLIPTA) 62.5-25 MCG/INH AEPB Inhale 1 puff into the lungs daily. (Patient not taking: Reported on 01/16/2017) 60 each 4   No current facility-administered medications for this visit.     OBJECTIVE: Vitals:   01/16/17 1409  BP: 104/69  Pulse: 78  Resp: 18  Temp: 97.7 F (36.5 C)     Body mass index is 31.95 kg/m.    ECOG FS:0 - Asymptomatic  General: Well-developed, well-nourished, no acute distress. Eyes: Pink conjunctiva, anicteric sclera. Lungs: Clear to auscultation bilaterally. Heart: Regular rate and rhythm. No rubs, murmurs, or gallops. Abdomen: Soft, nontender, nondistended. No organomegaly noted, normoactive bowel sounds. Musculoskeletal: No edema, cyanosis, or clubbing. Neuro: Alert, answering all questions appropriately. Cranial nerves grossly intact. Skin: No rashes or petechiae noted. Psych: Normal affect.   LAB RESULTS:  Lab Results  Component Value Date   NA 135 01/01/2017   K 4.3 01/01/2017   CL 103 01/01/2017   CO2 22 01/01/2017   GLUCOSE 145 (H) 01/01/2017   BUN 11 01/01/2017   CREATININE 0.71 01/01/2017   CALCIUM 9.4 01/01/2017   PROT 7.9 01/01/2017   ALBUMIN 4.4 01/01/2017   AST 34 01/01/2017   ALT 11 (L) 01/01/2017    ALKPHOS 45 01/01/2017   BILITOT 2.0 (H) 01/01/2017   GFRNONAA >60 01/01/2017   GFRAA >60 01/01/2017    Lab Results  Component Value Date   WBC 4.8 01/01/2017   NEUTROABS 4.2 12/04/2016   HGB 12.1 01/01/2017   HCT 36.1 01/01/2017   MCV 87.2 01/01/2017   PLT 269 01/01/2017     STUDIES: Ct Chest W Contrast  Result Date: 12/26/2016 CLINICAL DATA:  Small cell carcinoma. EXAM: CT CHEST WITH CONTRAST TECHNIQUE: Multidetector CT imaging of the chest was performed during intravenous contrast administration. CONTRAST:  15mL ISOVUE-300 IOPAMIDOL (ISOVUE-300) INJECTION 61% COMPARISON:  PET-CT 09/24/2016 FINDINGS: Cardiovascular: Normal heart size. No pericardial effusion. Calcification in the LAD coronary arteries. Mediastinum/Nodes: The trachea appears patent and is midline. Circumferential wall thickening involving the mid esophagus is identified and appears similar to the previous exam. No enlarged mediastinal or hilar adenopathy. Lungs/Pleura: Small amount of right pleural fluid is identified, slightly increased in volume from previous exam. Progressive masslike architectural distortion, fibrosis and volume loss within the paramediastinal right lung is identified compatible with changes due to external beam radiation. The right hilar mass measures 1.5 x 2.4 cm, image 46 of series 2. Previously 2.3 x 2.9 cm. Upper Abdomen: The adrenal glands appear within normal limits. No acute abnormality identified within the upper abdomen. Aortocaval node is prominent measuring 8 mm and is unchanged from previous exam. Musculoskeletal: Degenerative disc disease noted within the thoracic spine. IMPRESSION: 1. Progressive changes of external beam radiation involving the right lung. 2. Index right perihilar lung mass has decreased in size from previous exam. 3. Persistent wall thickening involving the esophagus which may reflect changes secondary to external beam radiation. Electronically Signed   By: Kerby Moors  M.D.   On: 12/26/2016 14:24   US Abdomen Complete  Result Date: 01/10/2017 CLINICAL DATA:  Generalize abdominal pain with elevated bilirubin. History of lung cancer. EXAM: ABDOMEN ULTRASOUND COMPLETE COMPARISON:  PET CT 09/24/2016 FINDINGS: Gallbladder: Gallbladder sludge. Few tiny echogenic foci that could be tiny stones. No convincing shadowing. No Murphy sign. Normal wall thickness. Common bile duct: Diameter: 7 mm.  No visible ductal stone. Liver: No focal lesion identified. Within normal limits in parenchymal echogenicity. Portal vein is patent on color Doppler imaging with normal direction  of blood flow towards the liver. IVC: No abnormality visualized. Pancreas: Visualized portion unremarkable. Spleen: Size and appearance within normal limits. Right Kidney: Length: 11.6 cm. Echogenicity within normal limits. No mass or hydronephrosis visualized. Left Kidney: Length: 11.4 cm. Echogenicity within normal limits. No mass or hydronephrosis visualized. Abdominal aorta: No aneurysm visualized. Other findings: No ascites. IMPRESSION: Gallbladder sludge with dependent echogenic foci. These could represent tiny stones. None convincingly shadow. No Murphy sign. No wall thickening. The common duct is prominent, raising the possibility if there could be choledocholithiasis. No stone is identified, but a small stone at the ampulla would be inapparent. Electronically Signed   By: Nelson Chimes M.D.   On: 01/10/2017 08:45    ASSESSMENT: Clinical stage IIIB small cell lung cancer of the right lung.  PLAN:    1. Clinical stage IIIB small cell lung cancer of the right lung: Patient completed concurrent chemotherapy and XRT at outside facility and then transferred care to [facility] to complete treatment and long-term monitoring for recurrence. She completed cycle 6 of carboplatinum and etoposide on August 15, 2016.  She also recently completed PCI.  CT scan results from December 26, 2016 reviewed independently and reported  as above with continued improvement of patient's disease burden as well as right hilar mass.  No intervention is needed at this time. Return to clinic in 3 months with repeat imaging using CT scan and further evaluation.  If there is continued improvement of her imaging, can consider changing imaging to every 6 months.   2. Anemia: Resolved. 3.  Elevated bilirubin: Ultrasound results as above, monitor.  Approximately 30 minutes was spent in discussion of which greater than 50% was consultation.  Patient expressed understanding and was in agreement with this plan. She also understands that She can call clinic at any time with any questions, concerns, or complaints.   Cancer Staging Small cell lung cancer, right [facility]) Staging form: Lung, AJCC 8th Edition - Clinical stage from 08/18/2016: Stage IIIB (cT2a, cN3, cM0) - Signed by Lloyd Huger, MD on 08/18/2016   Lloyd Huger, MD   01/19/2017 9:35 AM

## 2017-01-16 ENCOUNTER — Inpatient Hospital Stay: Payer: Medicare Other | Attending: Oncology | Admitting: Oncology

## 2017-01-16 VITALS — BP 104/69 | HR 78 | Temp 97.7°F | Resp 18 | Wt 158.2 lb

## 2017-01-16 DIAGNOSIS — Z923 Personal history of irradiation: Secondary | ICD-10-CM | POA: Diagnosis not present

## 2017-01-16 DIAGNOSIS — Z7982 Long term (current) use of aspirin: Secondary | ICD-10-CM | POA: Diagnosis not present

## 2017-01-16 DIAGNOSIS — Z87891 Personal history of nicotine dependence: Secondary | ICD-10-CM

## 2017-01-16 DIAGNOSIS — Z7952 Long term (current) use of systemic steroids: Secondary | ICD-10-CM | POA: Insufficient documentation

## 2017-01-16 DIAGNOSIS — K219 Gastro-esophageal reflux disease without esophagitis: Secondary | ICD-10-CM | POA: Insufficient documentation

## 2017-01-16 DIAGNOSIS — Z9221 Personal history of antineoplastic chemotherapy: Secondary | ICD-10-CM | POA: Insufficient documentation

## 2017-01-16 DIAGNOSIS — I10 Essential (primary) hypertension: Secondary | ICD-10-CM | POA: Diagnosis not present

## 2017-01-16 DIAGNOSIS — Z803 Family history of malignant neoplasm of breast: Secondary | ICD-10-CM | POA: Diagnosis not present

## 2017-01-16 DIAGNOSIS — J449 Chronic obstructive pulmonary disease, unspecified: Secondary | ICD-10-CM

## 2017-01-16 DIAGNOSIS — F419 Anxiety disorder, unspecified: Secondary | ICD-10-CM | POA: Insufficient documentation

## 2017-01-16 DIAGNOSIS — M1990 Unspecified osteoarthritis, unspecified site: Secondary | ICD-10-CM | POA: Diagnosis not present

## 2017-01-16 DIAGNOSIS — Z7984 Long term (current) use of oral hypoglycemic drugs: Secondary | ICD-10-CM

## 2017-01-16 DIAGNOSIS — R109 Unspecified abdominal pain: Secondary | ICD-10-CM

## 2017-01-16 DIAGNOSIS — Z880 Allergy status to penicillin: Secondary | ICD-10-CM

## 2017-01-16 DIAGNOSIS — C3491 Malignant neoplasm of unspecified part of right bronchus or lung: Secondary | ICD-10-CM | POA: Diagnosis not present

## 2017-01-16 DIAGNOSIS — E119 Type 2 diabetes mellitus without complications: Secondary | ICD-10-CM | POA: Diagnosis not present

## 2017-01-16 DIAGNOSIS — Z79899 Other long term (current) drug therapy: Secondary | ICD-10-CM | POA: Diagnosis not present

## 2017-01-16 DIAGNOSIS — H409 Unspecified glaucoma: Secondary | ICD-10-CM | POA: Diagnosis not present

## 2017-02-03 ENCOUNTER — Emergency Department
Admission: EM | Admit: 2017-02-03 | Discharge: 2017-02-03 | Disposition: A | Discharge status: Home / Self Care | Payer: Medicare Other | Attending: Emergency Medicine | Admitting: Emergency Medicine

## 2017-02-03 ENCOUNTER — Encounter: Payer: Self-pay | Admitting: Emergency Medicine

## 2017-02-03 ENCOUNTER — Telehealth: Payer: Self-pay

## 2017-02-03 ENCOUNTER — Other Ambulatory Visit: Payer: Self-pay

## 2017-02-03 ENCOUNTER — Emergency Department: Payer: Medicare Other

## 2017-02-03 DIAGNOSIS — E119 Type 2 diabetes mellitus without complications: Secondary | ICD-10-CM | POA: Diagnosis not present

## 2017-02-03 DIAGNOSIS — C3411 Malignant neoplasm of upper lobe, right bronchus or lung: Secondary | ICD-10-CM | POA: Diagnosis not present

## 2017-02-03 DIAGNOSIS — Z7982 Long term (current) use of aspirin: Secondary | ICD-10-CM | POA: Insufficient documentation

## 2017-02-03 DIAGNOSIS — R0602 Shortness of breath: Secondary | ICD-10-CM | POA: Diagnosis not present

## 2017-02-03 DIAGNOSIS — Z7984 Long term (current) use of oral hypoglycemic drugs: Secondary | ICD-10-CM | POA: Insufficient documentation

## 2017-02-03 DIAGNOSIS — Z87891 Personal history of nicotine dependence: Secondary | ICD-10-CM | POA: Diagnosis not present

## 2017-02-03 DIAGNOSIS — F419 Anxiety disorder, unspecified: Secondary | ICD-10-CM | POA: Diagnosis not present

## 2017-02-03 DIAGNOSIS — J40 Bronchitis, not specified as acute or chronic: Secondary | ICD-10-CM | POA: Diagnosis not present

## 2017-02-03 DIAGNOSIS — I10 Essential (primary) hypertension: Secondary | ICD-10-CM | POA: Insufficient documentation

## 2017-02-03 DIAGNOSIS — J441 Chronic obstructive pulmonary disease with (acute) exacerbation: Secondary | ICD-10-CM

## 2017-02-03 DIAGNOSIS — Z79899 Other long term (current) drug therapy: Secondary | ICD-10-CM | POA: Insufficient documentation

## 2017-02-03 DIAGNOSIS — R05 Cough: Secondary | ICD-10-CM | POA: Diagnosis not present

## 2017-02-03 LAB — CBC WITH DIFFERENTIAL/PLATELET
BASOS PCT: 0 %
Basophils Absolute: 0 10*3/uL (ref 0–0.1)
EOS ABS: 0 10*3/uL (ref 0–0.7)
EOS PCT: 1 %
HCT: 40.8 % (ref 35.0–47.0)
Hemoglobin: 13.4 g/dL (ref 12.0–16.0)
LYMPHS ABS: 0.7 10*3/uL — AB (ref 1.0–3.6)
Lymphocytes Relative: 10 %
MCH: 29.2 pg (ref 26.0–34.0)
MCHC: 33 g/dL (ref 32.0–36.0)
MCV: 88.5 fL (ref 80.0–100.0)
MONOS PCT: 4 %
Monocytes Absolute: 0.2 10*3/uL (ref 0.2–0.9)
Neutro Abs: 5.8 10*3/uL (ref 1.4–6.5)
Neutrophils Relative %: 85 %
PLATELETS: 245 10*3/uL (ref 150–440)
RBC: 4.61 MIL/uL (ref 3.80–5.20)
RDW: 16.4 % — ABNORMAL HIGH (ref 11.5–14.5)
WBC: 6.7 10*3/uL (ref 3.6–11.0)

## 2017-02-03 LAB — BASIC METABOLIC PANEL
Anion gap: 13 (ref 5–15)
BUN: 10 mg/dL (ref 6–20)
CALCIUM: 9.7 mg/dL (ref 8.9–10.3)
CHLORIDE: 104 mmol/L (ref 101–111)
CO2: 22 mmol/L (ref 22–32)
CREATININE: 0.59 mg/dL (ref 0.44–1.00)
GFR calc Af Amer: >60 mL/min (ref >60)
GFR calc non Af Amer: >60 mL/min (ref >60)
Glucose, Bld: 196 mg/dL — ABNORMAL HIGH (ref 65–99)
Potassium: 3.3 mmol/L — ABNORMAL LOW (ref 3.5–5.1)
SODIUM: 139 mmol/L (ref 135–145)

## 2017-02-03 LAB — TROPONIN I

## 2017-02-03 MED ORDER — PREDNISONE 20 MG PO TABS: 60.0000 mg | ORAL_TABLET | Freq: Every day | ORAL | 0 refills | Status: AC

## 2017-02-03 MED ORDER — METHYLPREDNISOLONE SODIUM SUCC 125 MG IJ SOLR
125.0000 mg | Freq: Once | INTRAMUSCULAR | Status: AC
  Administered 2017-02-03: 125 mg via INTRAVENOUS
  Filled 2017-02-03: qty 2

## 2017-02-03 MED ORDER — IPRATROPIUM-ALBUTEROL 0.5-2.5 (3) MG/3ML IN SOLN
3.0000 mL | Freq: Once | RESPIRATORY_TRACT | Status: AC
  Administered 2017-02-03: 3 mL via RESPIRATORY_TRACT
  Filled 2017-02-03: qty 3

## 2017-02-03 MED ORDER — ALBUTEROL SULFATE (2.5 MG/3ML) 0.083% IN NEBU: 2.5000 mg | INHALATION_SOLUTION | Freq: Four times a day (QID) | RESPIRATORY_TRACT | 3 refills | Status: DC | PRN

## 2017-02-03 MED ORDER — DIAZEPAM 5 MG PO TABS
5.0000 mg | ORAL_TABLET | Freq: Once | ORAL | Status: AC
  Administered 2017-02-03: 5 mg via ORAL
  Filled 2017-02-03: qty 1

## 2017-02-03 MED ORDER — ALBUTEROL SULFATE (2.5 MG/3ML) 0.083% IN NEBU
5.0000 mg | INHALATION_SOLUTION | Freq: Once | RESPIRATORY_TRACT | Status: AC
  Administered 2017-02-03: 5 mg via RESPIRATORY_TRACT
  Filled 2017-02-03: qty 6

## 2017-02-03 MED ORDER — AZITHROMYCIN 250 MG PO TABS: ORAL_TABLET | ORAL | 0 refills | Status: DC

## 2017-02-03 MED ORDER — SODIUM CHLORIDE 0.9 % IV BOLUS (SEPSIS)
1000.0000 mL | Freq: Once | INTRAVENOUS | Status: AC
  Administered 2017-02-03: 1000 mL via INTRAVENOUS

## 2017-02-03 MED ORDER — LEVOFLOXACIN 750 MG PO TABS: 750.0000 mg | ORAL_TABLET | Freq: Every day | ORAL | 0 refills | Status: AC

## 2017-02-03 MED ORDER — MONTELUKAST SODIUM 10 MG PO TABS: 10.0000 mg | ORAL_TABLET | Freq: Every day | ORAL | 0 refills | Status: DC

## 2017-02-03 NOTE — Discharge Instructions (Signed)
Return to the emergency department immediately for new or worsening shortness of breath, chest pain, weakness, fevers, lightheadedness, or any other new or worsening symptoms that concern you.  You should return if you do not feel safe at home, or feel too weak to take care of yourself.  Follow-up with your primary care doctor this week as discussed.  You should take the Levaquin and prednisone starting tomorrow and finish the full course, and I have additionally prescribed you for albuterol for your nebulizer as well as restarted you on Singulair.

## 2017-02-03 NOTE — ED Notes (Signed)
Breathing treatment stopped and patient given tv remote.

## 2017-02-03 NOTE — Telephone Encounter (Signed)
Pt would like an antibiotic. Anything other than Z Pak. She has productive cough which is "barky" x 2 days. Also c/o back pain. Can not come into office because her car has broke down. [facility] [place].

## 2017-02-03 NOTE — Telephone Encounter (Signed)
Please review. Thanks!  

## 2017-02-03 NOTE — ED Notes (Signed)
ED Provider at bedside. 

## 2017-02-03 NOTE — ED Provider Notes (Signed)
[facility] Emergency Department Provider Note ____________________________________________   First MD Initiated Contact with Patient 02/03/17 1151     (approximate)  I have reviewed the triage vital signs and the nursing notes.   HISTORY  Chief Complaint Shortness of Breath and Cough    HPI Yvette Riley is a 62 y.o. female with past medical history as noted below including lung cancer in remission as well as COPD and pneumonia who presents with shortness of breath over the last 2 days, gradual onset, worsening course, associated with cough productive of green sputum, but not associated with fever.  Patient denies associated chest pain.  There is no leg swelling or pain.  She states it feels similar to when she has had pneumonia in the past.  She states that the symptoms have not been relieved by her inhaler at home.   Past Medical History:  Diagnosis Date  . Allergy   . Anemia   . Anxiety   . Arthritis   . Asthma   . Blood transfusion without reported diagnosis   . C. difficile diarrhea 04/07/2016  . Cancer ([place])    lung ca  . CAP (community acquired pneumonia) 03/21/2015  . COPD (chronic obstructive pulmonary disease) ([place])   . Diabetes mellitus without complication ([place])   . Displacement of lumbar intervertebral disc without myelopathy   . Emphysema of lung ([place])   . GERD (gastroesophageal reflux disease)   . Glaucoma   . History of chicken pox   . Hypertension   . Lung cancer ([place])   . Pneumonia 03/29/2015  . Shortness of breath     Patient Active Problem List   Diagnosis Date Noted  . Anxiety 07/19/2016  . Small cell lung cancer, right ([place]) 05/13/2016  . Chemoprophylaxis 04/29/2016  . Severe obesity (BMI 35.0-39.9) with comorbidity ([place]) 04/16/2016  . Elevated d-dimer 04/06/2016  . Weakness generalized 04/06/2016  . Sepsis ([place]) 03/17/2016  . Mass of upper lobe of right lung 03/01/2016  . Coronary atherosclerosis 02/29/2016  .  Personal history of tobacco use, presenting hazards to health 02/28/2016  . Allergic rhinitis 09/27/2014  . Asthma 09/27/2014  . Anemia 09/27/2014  . Hyperglyceridemia, pure 09/27/2014  . Glaucoma 09/27/2014  . Hip pain 09/27/2014  . Right knee pain 09/27/2014  . Displacement of lumbar intervertebral disc without myelopathy 09/27/2014  . Thoracic back pain 09/27/2014  . Insomnia 08/11/2014  . COPD (chronic obstructive pulmonary disease) ([place]) 06/12/2014  . Type 2 diabetes mellitus ([place]) 06/12/2014  . Back pain 06/12/2014  . Essential hypertension 03/28/2014  . Smoking greater than 30 pack years 03/28/2014    Past Surgical History:  Procedure Laterality Date  . CESAREAN SECTION    . TUBAL LIGATION      Prior to Admission medications   Medication Sig Start Date End Date Taking? Authorizing Provider  aspirin EC 81 MG tablet Take 81 mg by mouth daily.    Yes [provider]  clonazePAM (KLONOPIN) 0.5 MG tablet Take 1 tablet (0.5 mg total) by mouth 2 (two) times daily as needed for anxiety. 08/14/16  Yes Birdie Sons, MD  ferrous sulfate 325 (65 FE) MG tablet Take 1 tablet (325 mg total) by mouth 2 (two) times daily with a meal. 03/20/16  Yes Vaughan Basta, MD  ipratropium-albuterol (DUONEB) 0.5-2.5 (3) MG/3ML SOLN Inhale 3 mLs into the lungs 4 (four) times daily as needed. 09/27/16 09/22/17 Yes [provider]  meloxicam (MOBIC) 15 MG tablet Take 1  tablet (15 mg total) by mouth daily as needed for pain. 08/05/16  Yes Birdie Sons, MD  metFORMIN (GLUCOPHAGE XR) 500 MG 24 hr tablet Take 2 tablets (1,000 mg total) by mouth daily with breakfast. With meals 05/29/16  Yes Birdie Sons, MD  mirtazapine (REMERON) 15 MG tablet Take 1 tablet (15 mg total) by mouth at bedtime. 05/29/16  Yes Birdie Sons, MD  sucralfate (CARAFATE) 1 g tablet Take 1 tablet (1 g total) by mouth 4 (four) times daily -  with meals and at bedtime. 10/30/16  Yes Chrystal, Eulas Post, MD    umeclidinium-vilanterol (ANORO ELLIPTA) 62.5-25 MCG/INH AEPB Inhale 1 puff into the lungs daily. 03/27/16  Yes Laverle Hobby, MD  zolpidem (AMBIEN) 10 MG tablet TAKE 1 TABLET BY MOUTH AT BEDTIME 09/30/16  Yes Birdie Sons, MD  albuterol (PROVENTIL HFA;VENTOLIN HFA) 108 (90 Base) MCG/ACT inhaler Inhale 2 puffs every 6 (six) hours as needed into the lungs for wheezing or shortness of breath. 12/04/16   Jacquelin Hawking, NP  azithromycin (ZITHROMAX) 250 MG tablet 2 by mouth today, then 1 daily for 4 days Patient not taking: Reported on 02/03/2017 02/03/17 02/08/17  Birdie Sons, MD  dexamethasone (DECADRON) 4 MG tablet Take 1 tablet (4 mg total) by mouth daily. Begin on 1st day of radiation Patient not taking: Reported on 01/16/2017 10/11/16   Noreene Filbert, MD  dexamethasone (DECADRON) 4 MG tablet Take 1 tablet (4 mg total) by mouth daily. Patient not taking: Reported on 01/16/2017 10/29/16   Noreene Filbert, MD  glucose blood test strip Check blood sugar daily. Dx Type 2 diabetes E11.9 06/07/15   Birdie Sons, MD  meclizine (ANTIVERT) 25 MG tablet TAKE ONE TABLET BY MOUTH THREE TIMES DAILY AS NEEDED FOR DIZZINESS Patient not taking: Reported on 01/16/2017 02/16/16   Birdie Sons, MD  montelukast (SINGULAIR) 10 MG tablet Take 10 mg by mouth at bedtime.    [provider]  Nutritional Supplements ([place]) LIQD Drink 1-2 shakes per day 05/01/16   Mar Daring, PA-C  ondansetron (ZOFRAN) 4 MG tablet Take 1 tablet (4 mg total) by mouth every 8 (eight) hours as needed for nausea or vomiting. 01/01/17   Nance Pear, MD  oxyCODONE-acetaminophen [facility]) 10-325 MG tablet One tablet every 4-6 hours as needed 11/05/16   Birdie Sons, MD  predniSONE (DELTASONE) 20 MG tablet One pill twice daily for 5 days Patient not taking: Reported on 01/16/2017 12/30/16   Carmon Ginsberg, PA  prochlorperazine (COMPAZINE) 10 MG tablet Take 1 tablet by mouth every 6 (six)  hours as needed. 05/29/16   [provider]    Allergies Codeine; Crestor [rosuvastatin]; Other; Penicillins; Yellow jacket venom [bee venom]; Gemfibrozil; Statins; Trazodone and nefazodone; and Lipitor [atorvastatin]  Family History  Problem Relation Age of Onset  . Heart failure Mother   . Heart disease Mother   . Stroke Mother   . Heart disease Brother   . COPD Brother   . Cancer Maternal Aunt        Breast Cancer    Social History Social History   Tobacco Use  . Smoking status: Former Smoker    Packs/day: 0.25    Years: 45.00    Pack years: 11.25    Types: Cigarettes    Last attempt to quit: 03/17/2016    Years since quitting: 0.8  . Smokeless tobacco: Never Used  . Tobacco comment: previously smoked 2 1/2 ppd for 30 years.  Substance Use Topics  . Alcohol use: No    Alcohol/week: 0.0 oz  . Drug use: No    Review of Systems  Constitutional: No fever/chills. Eyes: No redness. ENT: No sore throat. Cardiovascular: Denies chest pain. Respiratory: Positive for shortness of breath. Gastrointestinal: No nausea, no vomiting.  No diarrhea.  Genitourinary: Negative for dysuria.  Musculoskeletal: Negative for back pain. Skin: Negative for rash. Neurological: Negative for headache.   ____________________________________________   PHYSICAL EXAM:  VITAL SIGNS: ED Triage Vitals  Enc Vitals Group     BP 02/03/17 1015 119/68     Pulse Rate 02/03/17 1015 84     Resp 02/03/17 1126 18     Temp 02/03/17 1015 97.8 F (36.6 C)     Temp Source 02/03/17 1015 Oral     SpO2 02/03/17 1015 99 %     Weight 02/03/17 1013 158 lb (71.7 kg)     Height 02/03/17 1013 4\' 11"  (1.499 m)     Head Circumference --      Peak Flow --      Pain Score 02/03/17 1011 3     Pain Loc --      Pain Edu? --      Excl. in [place]? --     Constitutional: Alert and oriented.  Slightly uncomfortable appearing but in no acute distress. Eyes: Conjunctivae are normal.  EOMI. Head:  Atraumatic. Nose: No congestion/rhinnorhea. Mouth/Throat: Mucous membranes are moist.   Neck: Normal range of motion.  Cardiovascular: Tachycardic, regular rhythm. Grossly normal heart sounds.  Good peripheral circulation. Respiratory: Normal respiratory effort.  No retractions. Lungs with scattered wheezes bilaterally and moderate rhonchi in the right side.. Gastrointestinal: Soft and nontender. No distention.  Genitourinary: No CVA tenderness. Musculoskeletal: No lower extremity edema.  Extremities warm and well perfused.  Neurologic:  Normal speech and language. No gross focal neurologic deficits are appreciated.  Skin:  Skin is warm and dry. No rash noted. Psychiatric: Mood and affect are normal. Speech and behavior are normal.  ____________________________________________   LABS (all labs ordered are listed, but only abnormal results are displayed)  Labs Reviewed  BASIC METABOLIC PANEL - Abnormal; Notable for the following components:      Result Value   Potassium 3.3 (*)    Glucose, Bld 196 (*)    All other components within normal limits  CBC WITH DIFFERENTIAL/PLATELET - Abnormal; Notable for the following components:   RDW 16.4 (*)    Lymphs Abs 0.7 (*)    All other components within normal limits  TROPONIN I   ____________________________________________  EKG  ED ECG REPORT I, Arta Silence, the attending physician, personally viewed and interpreted this ECG.  Date: 02/03/2017 EKG Time: 1022 Rate: 84 Rhythm: normal sinus rhythm QRS Axis: normal Intervals: Incomplete right bundle ST/T Wave abnormalities: Nonspecific T wave abnormalities and possible RV hypertrophy Narrative Interpretation: no evidence of acute ischemia; no significant change when compared to EKG of 06/26/2016  ____________________________________________  RADIOLOGY  CXR: Postradiation changes on right side but no acute focal infiltrate or evidence of pulmonary  edema  ____________________________________________   PROCEDURES  Procedure(s) performed: No    Critical Care performed: No ____________________________________________   INITIAL IMPRESSION / ASSESSMENT AND PLAN / ED COURSE  Pertinent labs & imaging results that were available during my care of the patient were reviewed by me and considered in my medical decision making (see chart for details).  62 year old female with past medical history as noted above including lung cancer in  remission, COPD, and prior episodes of pneumonia presents with shortness of breath and cough for the last several days, feeling somewhat similar to prior pneumonia and bronchitis episodes.  She denies fever or chest pain.  There is no peripheral edema.  On exam, the patient initially had normal vital signs but now has mild tachycardia after a nebulizer treatment.  She is somewhat chronically weak appearing but not acutely ill, and in no respiratory distress.  There are rhonchi in the right lung and scattered with good air movement.  I reviewed the past medical records in Epic; confirmed the past medical history listed above.  Patient was most recently seen in the ED approximately 1 month ago for dizziness and nausea and discharged and has had no recent admission for pneumonia or COPD exacerbation.  Differential includes pneumonia, bronchitis, viral syndrome, less likely cancer recurrence, or cardiac cause.  Plan for nebs, steroid, chest x-ray, lab workup, and reassess.  Given the patient is not hypoxic and not in respiratory distress likely will not need admission.  ----------------------------------------- 2:21 PM on 02/03/2017 -----------------------------------------  X-ray shows post radiation changes but no evidence of pneumonia.  Patient's O2 sat remains in the high 90s on room air, and her.  She reports improvement in her breathing after the nebulizer and steroid.  Presentation overall consistent with  bronchitis.  Given the patient is not hypoxic and the labs are reassuring, she should be appropriate for discharge home.  However patient reports to me that she wanted to see if she could get additional help managing her medical conditions at home or qualify for rehab, and I offered social worker evaluation to determine if patient would qualify for any additional care.  There is no indication for admission at this time.  ----------------------------------------- 3:21 PM on 02/03/2017 -----------------------------------------  The patient now states that she does not want to wait longer for the social worker and would like to go home.  She is sitting the chair, with no apparent difficulty breathing and states that she is feeling better.  Her pulse remains in the 110s, however patient states that she feels anxious, and this is also consistent with the effects of albuterol.  Other VS are stable (borderline BP reading of 95/73 appears spurious as patient's BP has been otherwise stable and she has no sx of lightheadedness or hypotension clinically).    I will give the patient prescription for steroid and albuterol as well as an antibiotic, and she states that she has arranged to see her primary care doctor tomorrow.  I emphasized the return precautions and she agrees to return if she feels worse or has any other concerns. ____________________________________________   FINAL CLINICAL IMPRESSION(S) / ED DIAGNOSES  Final diagnoses:  COPD exacerbation ([place])  Bronchitis      NEW MEDICATIONS STARTED DURING THIS VISIT:  This SmartLink is deprecated. Use AVSMEDLIST instead to display the medication list for a patient.   Note:  This document was prepared using Dragon voice recognition software and may include unintentional dictation errors.    Arta Silence, MD 02/03/17 1524

## 2017-02-03 NOTE — ED Triage Notes (Signed)
Says cough and sob.hasbeen using svns at home. Is worried she  Ha s pneumonia

## 2017-02-04 ENCOUNTER — Ambulatory Visit (INDEPENDENT_AMBULATORY_CARE_PROVIDER_SITE_OTHER): Payer: Medicare Other | Admitting: Family Medicine

## 2017-02-04 ENCOUNTER — Encounter: Payer: Self-pay | Admitting: Family Medicine

## 2017-02-04 DIAGNOSIS — M545 Low back pain: Secondary | ICD-10-CM | POA: Diagnosis not present

## 2017-02-04 DIAGNOSIS — I10 Essential (primary) hypertension: Secondary | ICD-10-CM

## 2017-02-04 DIAGNOSIS — G8929 Other chronic pain: Secondary | ICD-10-CM | POA: Diagnosis not present

## 2017-02-04 DIAGNOSIS — E119 Type 2 diabetes mellitus without complications: Secondary | ICD-10-CM | POA: Diagnosis not present

## 2017-02-04 DIAGNOSIS — J441 Chronic obstructive pulmonary disease with (acute) exacerbation: Secondary | ICD-10-CM

## 2017-02-04 MED ORDER — OXYCODONE-ACETAMINOPHEN 10-325 MG PO TABS: ORAL_TABLET | ORAL | 0 refills | Status: DC

## 2017-02-04 MED ORDER — TIOTROPIUM BROMIDE-OLODATEROL 2.5-2.5 MCG/ACT IN AERS: 2.0000 | INHALATION_SPRAY | Freq: Every day | RESPIRATORY_TRACT | 4 refills | Status: DC

## 2017-02-04 NOTE — Progress Notes (Signed)
Patient: Yvette Riley Female    DOB: 10-05-1955   62 y.o.   MRN: [id number] Visit Date: 02/04/2017  Today's Provider: Lelon Huh, MD   No chief complaint on file.  Subjective:    HPI   Follow up ER visit  Patient was seen in ER for COPD exacerbation Bronchitis on 02/03/2017. She was treated for COPD exacerbation Bronchitis. Treatment for this included; patient was given rx for steroid, levofloxacin, and albuterol Patient advised to follow-up with pcp the next day. She reports good compliance with treatment. She reports this condition is Unchanged.  She states she has been out of Anuro for several months due to expense, but is still using albuterol nebulizer ------------------------------------------------------------------------------------   Diabetes Mellitus Type II, Follow-up:   Lab Results  Component Value Date   HGBA1C 7.1 11/05/2016   HGBA1C 6.4 07/19/2016   HGBA1C 6.0 02/19/2016    Last seen for diabetes 3 months ago.  Management since then includes continue same medications. . She reports good compliance with treatment. She is not having side effects.    Episodes of hypoglycemia? no   Current Insulin Regimen: n/a  Pertinent Labs:    Component Value Date/Time   CREATININE 0.59 02/03/2017 1246   CREATININE 0.68 02/02/2012 1114    Wt Readings from Last 3 Encounters:  02/04/17 155 lb (70.3 kg)  02/03/17 158 lb (71.7 kg)  01/16/17 158 lb 3.2 oz (71.8 kg)    ------------------------------------------------------------------------  She is also having more back pain lately and requesting increase in her pain medications. She take meloxicam every night but was afraid to take with oxycodone/apap which she takes during the day.   Allergies  Allergen Reactions  . Codeine Anaphylaxis  . Crestor [Rosuvastatin] Anaphylaxis  . Other Anaphylaxis  . Penicillins Anaphylaxis, Rash and Other (See Comments)    Reaction:  Tongue swelling  Has patient  had a PCN reaction causing immediate rash, facial/tongue/throat swelling, SOB or lightheadedness with hypotension:  Yes   Has patient had a PCN reaction causing severe rash involving mucus membranes or skin necrosis: No Has patient had a PCN reaction that required hospitalization No Has patient had a PCN reaction occurring within the last 10 years: No If all of the above answers are "NO", then may proceed with Cephalosporin use.  . [place] Venom [Bee Venom] Anaphylaxis  . Gemfibrozil Rash, Nausea And Vomiting and Swelling  . Statins Nausea And Vomiting and Swelling  . Trazodone And Nefazodone Nausea And Vomiting  . Lipitor [Atorvastatin] Rash     Current Outpatient Medications:  .  albuterol (PROVENTIL) (2.5 MG/3ML) 0.083% nebulizer solution, Take 3 mLs (2.5 mg total) by nebulization every 6 (six) hours as needed for wheezing or shortness of breath., Disp: 75 mL, Rfl: 3 .  aspirin EC 81 MG tablet, Take 81 mg by mouth daily. , Disp: , Rfl:  .  clonazePAM (KLONOPIN) 0.5 MG tablet, Take 1 tablet (0.5 mg total) by mouth 2 (two) times daily as needed for anxiety., Disp: 60 tablet, Rfl: 3 .  ferrous sulfate 325 (65 FE) MG tablet, Take 1 tablet (325 mg total) by mouth 2 (two) times daily with a meal., Disp: 30 tablet, Rfl: 3 .  glucose blood test strip, Check blood sugar daily. Dx Type 2 diabetes E11.9, Disp: 100 each, Rfl: 3 .  ipratropium-albuterol (DUONEB) 0.5-2.5 (3) MG/3ML SOLN, Inhale 3 mLs into the lungs 4 (four) times daily as needed., Disp: , Rfl:  .  levofloxacin (  LEVAQUIN) 750 MG tablet, Take 1 tablet (750 mg total) by mouth daily for 6 days., Disp: 6 tablet, Rfl: 0 .  meloxicam (MOBIC) 15 MG tablet, Take 1 tablet (15 mg total) by mouth daily as needed for pain., Disp: 90 tablet, Rfl: 2 .  metFORMIN (GLUCOPHAGE XR) 500 MG 24 hr tablet, Take 2 tablets (1,000 mg total) by mouth daily with breakfast. With meals, Disp: 1 tablet, Rfl: 1 .  mirtazapine (REMERON) 15 MG tablet, Take 1 tablet  (15 mg total) by mouth at bedtime., Disp: 30 tablet, Rfl: 3 .  montelukast (SINGULAIR) 10 MG tablet, Take 1 tablet (10 mg total) by mouth at bedtime., Disp: 30 tablet, Rfl: 0 .  Nutritional Supplements (GLUCERNA ADVANCE SHAKE) LIQD, Drink 1-2 shakes per day, Disp: 24 Bottle, Rfl: 12 .  ondansetron (ZOFRAN) 4 MG tablet, Take 1 tablet (4 mg total) by mouth every 8 (eight) hours as needed for nausea or vomiting., Disp: 20 tablet, Rfl: 0 .  oxyCODONE-acetaminophen (PERCOCET) 10-325 MG tablet, One tablet every 4-6 hours as needed, Disp: 180 tablet, Rfl: 0 .  predniSONE (DELTASONE) 20 MG tablet, Take 3 tablets (60 mg total) by mouth daily for 4 days., Disp: 12 tablet, Rfl: 0 .  prochlorperazine (COMPAZINE) 10 MG tablet, Take 1 tablet by mouth every 6 (six) hours as needed., Disp: , Rfl:  .  sucralfate (CARAFATE) 1 g tablet, Take 1 tablet (1 g total) by mouth 4 (four) times daily -  with meals and at bedtime., Disp: 120 tablet, Rfl: 4 .  umeclidinium-vilanterol (ANORO ELLIPTA) 62.5-25 MCG/INH AEPB, Inhale 1 puff into the lungs daily., Disp: 60 each, Rfl: 4 .  zolpidem (AMBIEN) 10 MG tablet, TAKE 1 TABLET BY MOUTH AT BEDTIME, Disp: 30 tablet, Rfl: 4  Review of Systems  Constitutional: Negative for appetite change, chills, fatigue and fever.  Respiratory: Negative for chest tightness and shortness of breath.   Cardiovascular: Negative for chest pain and palpitations.  Gastrointestinal: Negative for abdominal pain, nausea and vomiting.  Neurological: Negative for dizziness and weakness.    Social History   Tobacco Use  . Smoking status: Former Smoker    Packs/day: 0.25    Years: 45.00    Pack years: 11.25    Types: Cigarettes    Last attempt to quit: 03/17/2016    Years since quitting: 0.8  . Smokeless tobacco: Never Used  . Tobacco comment: previously smoked 2 1/2 ppd for 30 years.   Substance Use Topics  . Alcohol use: No    Alcohol/week: 0.0 oz   Objective:   BP 110/70 (BP Location: Right  Arm, Patient Position: Sitting, Cuff Size: Normal)   Pulse (!) 115   Temp 98.3 F (36.8 C) (Oral)   Resp 18   Wt 155 lb (70.3 kg)   SpO2 97%   BMI 31.31 kg/m  (room air)   Physical Exam   General Appearance:    Alert, cooperative, no distress  Eyes:    PERRL, conjunctiva/corneas clear, EOM's intact       Lungs:     Extensive expiratory wheeze, no rales., respirations unlabored  Heart:    Regular rate and rhythm  Neurologic:   Awake, alert, oriented x 3. No apparent focal neurological           defect.           Assessment & Plan:     1. COPD exacerbation (HCC) Continue levofloxacin, prednisone and nebulizers. Extensively counseled on importance of maintenance inhalers. She  doesn't want to return to her pulmonologist and cannot afford Anuro. Will try samples and given prescription for Stialto Respimat. She is to return for recheck in 10 days. She would like re-evaluation for oxygen.   2. Chronic midline low back pain, with sciatica presence unspecified Advised she can take oxycodone/apap with meloxicam in the evening, but cannot exceed 6 oxycodone/apap per day. Offered referral to pain clinic which she refused.  - oxyCODONE-acetaminophen (PERCOCET) 10-325 MG tablet; One tablet every 4-6 hours as needed  Dispense: 180 tablet; Refill: 0  3. Type 2 diabetes mellitus without complication, without long-term current use of insulin ([place]) Doing well with current medications, but is due for labs.  - Hemoglobin A1c - Lipid panel  4. Essential hypertension Well controlled.  Continue current medications.    Counseled extensively on importance of maintenance inhalers, proper pain management, and requirements to qualify for supplemental oxygen.    Over half of this 45 minute visit were spent in counseling and coordinating care of multiple medical problems.        Lelon Huh, MD  [facility]

## 2017-02-04 NOTE — Patient Instructions (Signed)
   Start Stiolto 2 puffs once every day

## 2017-02-05 ENCOUNTER — Telehealth: Payer: Self-pay | Admitting: *Deleted

## 2017-02-05 LAB — HEMOGLOBIN A1C
ESTIMATED AVERAGE GLUCOSE: 128 mg/dL
HEMOGLOBIN A1C: 6.1 % — AB (ref 4.8–5.6)

## 2017-02-05 LAB — LIPID PANEL
CHOLESTEROL TOTAL: 266 mg/dL — AB (ref 100–199)
Chol/HDL Ratio: 4.6 ratio — ABNORMAL HIGH (ref 0.0–4.4)
HDL: 58 mg/dL (ref >39)
LDL Calculated: 142 mg/dL — ABNORMAL HIGH (ref 0–99)
Triglycerides: 331 mg/dL — ABNORMAL HIGH (ref 0–149)
VLDL CHOLESTEROL CAL: 66 mg/dL — AB (ref 5–40)

## 2017-02-05 MED ORDER — EZETIMIBE 10 MG PO TABS: 10.0000 mg | ORAL_TABLET | Freq: Every day | ORAL | 3 refills | Status: DC

## 2017-02-05 NOTE — Telephone Encounter (Signed)
Patient was notified of results. Expressed understanding. Rx sent to pharmacy. 

## 2017-02-05 NOTE — Telephone Encounter (Signed)
-----   Message from Birdie Sons, MD sent at 02/05/2017  7:54 AM EST ----- Sugar is well controlled, a1c is 6.1%. Cholesterol is much too high at 266. Recommend she try ezetimibe 10mg  once a day, #30, rf x 3. It is not a statin so should not cause any side effects. Follow up o.v. For lipids and diabetes in 4 months.

## 2017-02-13 ENCOUNTER — Telehealth: Payer: Self-pay | Admitting: Family Medicine

## 2017-02-17 DIAGNOSIS — J439 Emphysema, unspecified: Secondary | ICD-10-CM | POA: Diagnosis not present

## 2017-02-17 DIAGNOSIS — J310 Chronic rhinitis: Secondary | ICD-10-CM | POA: Diagnosis not present

## 2017-02-17 DIAGNOSIS — R05 Cough: Secondary | ICD-10-CM | POA: Diagnosis not present

## 2017-02-17 DIAGNOSIS — J449 Chronic obstructive pulmonary disease, unspecified: Secondary | ICD-10-CM | POA: Diagnosis not present

## 2017-02-24 ENCOUNTER — Telehealth: Payer: Self-pay

## 2017-02-24 ENCOUNTER — Telehealth: Payer: Self-pay | Admitting: Family Medicine

## 2017-02-24 MED ORDER — ZALEPLON 10 MG PO CAPS: 10.0000 mg | ORAL_CAPSULE | Freq: Every day | ORAL | 4 refills | Status: DC

## 2017-02-24 NOTE — Telephone Encounter (Signed)
Pharmacy called back again and stating the patient called and wanted to pick the Sonata in addition to what the patient picked up on 02/17/17.  Heather with the pharmacy states she wasn't going to let her have that medication since she already picked up the Ambien.  Nira Conn states she is going to deactivate the Read Drivers now so the patient doesn't get both medication.  She states insurance is paying for the Ambien ($8.73) and the amount for the Read Drivers is ($18.13) so she isn't sure the patient will want to pay for the [facility] either.    Nira Conn stated if you have any question you can give her a call [phone]

## 2017-02-24 NOTE — Telephone Encounter (Signed)
error 

## 2017-02-24 NOTE — Telephone Encounter (Signed)
Left message to call back to explain to patient again why she is not taking the Ambien. Earlier, she was getting ambien confused with meloxicam and I advised her that this is 2 different medications. We are changing Ambien to Zaleplon due to insurance coverage. Ambien is for sleep and Meloxicam is for her back pain. Left message to call her to make sure she understands this.

## 2017-02-24 NOTE — Telephone Encounter (Signed)
Heather with [facility] stated pt called the pharmacy stating the Rx we were supposed to send was for her back not for zaleplon (SONATA) 10 MG capsule because she already is taking zolpidem (AMBIEN) 10 MG tablet to help her sleep. Nira Conn called to make sure she heard the correct medication on the voicemail. While I was speaking with Nira Conn the pt called in and Lexine Baton advised pt that zaleplon (SONATA) 10 MG capsule was to take the place of the Zolpidem due to insurance. Nira Conn stated that pt last got the Zolpidem on 02/17/17 and the insurance did cover the medication and it cost pt 8.73$ out of pocket. Nira Conn also advised they put the Rx for zaleplon (SONATA) 10 MG capsule on hold b/c the pt stated she was taking Zolpidem and doesn't need the zaleplon (SONATA) 10 MG capsule at this time. Please advise. Thanks TNP

## 2017-02-24 NOTE — Telephone Encounter (Signed)
Noted  

## 2017-02-24 NOTE — Telephone Encounter (Signed)
Advised patient as below. Medication was called into the pharmacy.

## 2017-02-24 NOTE — Telephone Encounter (Signed)
Please advise patient that her insurance will not cover Ambien, but they will cover Zaleplon which is very similar. Need to try Zaleplon 10mg  QHS #30, rf x 4.

## 2017-03-03 ENCOUNTER — Telehealth: Payer: Self-pay

## 2017-03-03 MED ORDER — AZITHROMYCIN 250 MG PO TABS: ORAL_TABLET | ORAL | 0 refills | Status: AC

## 2017-03-03 NOTE — Telephone Encounter (Signed)
Patient is requesting a antibiotic be sent to [place] for a productive cough with yellow sputum the past 2-3 days. Patient states she is unable to come in for a appointment. CB# [phone].

## 2017-03-03 NOTE — Telephone Encounter (Signed)
Please advise 

## 2017-03-03 NOTE — Telephone Encounter (Signed)
Sent prescription to [facility] [address].

## 2017-03-04 NOTE — Telephone Encounter (Signed)
L/M stating below.  

## 2017-03-11 ENCOUNTER — Other Ambulatory Visit: Payer: Self-pay

## 2017-03-11 ENCOUNTER — Other Ambulatory Visit: Payer: Self-pay | Admitting: Family Medicine

## 2017-03-11 ENCOUNTER — Ambulatory Visit (INDEPENDENT_AMBULATORY_CARE_PROVIDER_SITE_OTHER): Payer: Medicare Other | Admitting: Family Medicine

## 2017-03-11 ENCOUNTER — Encounter: Payer: Self-pay | Admitting: Family Medicine

## 2017-03-11 VITALS — BP 120/78 | HR 86 | Temp 99.2°F | Resp 18 | Wt 156.0 lb

## 2017-03-11 DIAGNOSIS — J40 Bronchitis, not specified as acute or chronic: Secondary | ICD-10-CM | POA: Diagnosis not present

## 2017-03-11 DIAGNOSIS — J449 Chronic obstructive pulmonary disease, unspecified: Secondary | ICD-10-CM | POA: Diagnosis not present

## 2017-03-11 DIAGNOSIS — G8929 Other chronic pain: Secondary | ICD-10-CM

## 2017-03-11 DIAGNOSIS — M545 Low back pain: Principal | ICD-10-CM

## 2017-03-11 MED ORDER — CIPROFLOXACIN HCL 500 MG PO TABS: 500.0000 mg | ORAL_TABLET | Freq: Two times a day (BID) | ORAL | 0 refills | Status: AC

## 2017-03-11 NOTE — Telephone Encounter (Signed)
Patient requesting refills on the following medications:

## 2017-03-11 NOTE — Progress Notes (Signed)
Patient: Yvette Riley Female    DOB: Jul 31, 1955   62 y.o.   MRN: [id number] Visit Date: 03/11/2017  Today's Provider: Lelon Huh, MD   Chief Complaint  Patient presents with  . Cough    x 2 weeks    Subjective:    Cough  This is a recurrent problem. Episode onset: 2 weeks ago. The problem has been unchanged (occurs intermittently). The cough is non-productive (dry hacking). Associated symptoms include shortness of breath and wheezing. Pertinent negatives include no chest pain, chills or fever. Treatments tried: Z pack. The treatment provided no relief.   Has been more short of breath lately. Cough is sometimes productive of clear or slightly green phlegm. Has not improvement with azithromycin and had no improvement. Was in ER 02-03-2017 and had chest xray with no acute changes and normal CBC.   States she feels like she needs to be on oxygen, gets short of breath with exertion for years. Is followed by Dr. Raul Del who she says told her she does not need oxygen.     Allergies  Allergen Reactions  . Codeine Anaphylaxis  . Crestor [Rosuvastatin] Anaphylaxis  . Other Anaphylaxis  . Penicillins Anaphylaxis, Rash and Other (See Comments)    Reaction:  Tongue swelling  Has patient had a PCN reaction causing immediate rash, facial/tongue/throat swelling, SOB or lightheadedness with hypotension:  Yes   Has patient had a PCN reaction causing severe rash involving mucus membranes or skin necrosis: No Has patient had a PCN reaction that required hospitalization No Has patient had a PCN reaction occurring within the last 10 years: No If all of the above answers are "NO", then may proceed with Cephalosporin use.  . [place] Venom [Bee Venom] Anaphylaxis  . Gemfibrozil Rash, Nausea And Vomiting and Swelling  . Statins Nausea And Vomiting and Swelling  . Trazodone And Nefazodone Nausea And Vomiting  . Lipitor [Atorvastatin] Rash     Current Outpatient Medications:  .   albuterol (PROAIR HFA) 108 (90 Base) MCG/ACT inhaler, Take 2 puffs by mouth., Disp: , Rfl:  .  albuterol (PROVENTIL) (2.5 MG/3ML) 0.083% nebulizer solution, Take 3 mLs (2.5 mg total) by nebulization every 6 (six) hours as needed for wheezing or shortness of breath., Disp: 75 mL, Rfl: 3 .  aspirin EC 81 MG tablet, Take 81 mg by mouth daily. , Disp: , Rfl:  .  clonazePAM (KLONOPIN) 0.5 MG tablet, Take 1 tablet (0.5 mg total) by mouth 2 (two) times daily as needed for anxiety., Disp: 60 tablet, Rfl: 3 .  ezetimibe (ZETIA) 10 MG tablet, Take 1 tablet (10 mg total) by mouth daily., Disp: 30 tablet, Rfl: 3 .  ferrous sulfate 325 (65 FE) MG tablet, Take 1 tablet (325 mg total) by mouth 2 (two) times daily with a meal., Disp: 30 tablet, Rfl: 3 .  glucose blood test strip, Check blood sugar daily. Dx Type 2 diabetes E11.9, Disp: 100 each, Rfl: 3 .  meloxicam (MOBIC) 15 MG tablet, Take 1 tablet (15 mg total) by mouth daily as needed for pain., Disp: 90 tablet, Rfl: 2 .  metFORMIN (GLUCOPHAGE XR) 500 MG 24 hr tablet, Take 2 tablets (1,000 mg total) by mouth daily with breakfast. With meals, Disp: 1 tablet, Rfl: 1 .  mirtazapine (REMERON) 15 MG tablet, Take 1 tablet (15 mg total) by mouth at bedtime., Disp: 30 tablet, Rfl: 3 .  Nutritional Supplements (GLUCERNA ADVANCE SHAKE) LIQD, Drink 1-2 shakes per day,  Disp: 24 Bottle, Rfl: 12 .  ondansetron (ZOFRAN) 4 MG tablet, Take 1 tablet (4 mg total) by mouth every 8 (eight) hours as needed for nausea or vomiting., Disp: 20 tablet, Rfl: 0 .  oxyCODONE-acetaminophen (PERCOCET) 10-325 MG tablet, One tablet every 4-6 hours as needed, Disp: 180 tablet, Rfl: 0 .  prochlorperazine (COMPAZINE) 10 MG tablet, Take 1 tablet by mouth every 6 (six) hours as needed., Disp: , Rfl:  .  sucralfate (CARAFATE) 1 g tablet, Take 1 tablet (1 g total) by mouth 4 (four) times daily -  with meals and at bedtime., Disp: 120 tablet, Rfl: 4 .  Tiotropium Bromide-Olodaterol (STIOLTO RESPIMAT)  2.5-2.5 MCG/ACT AERS, Inhale 2 puffs into the lungs daily., Disp: 3 Inhaler, Rfl: 4 .  zaleplon (SONATA) 10 MG capsule, Take 1 capsule (10 mg total) by mouth at bedtime., Disp: 30 capsule, Rfl: 4 .  ipratropium-albuterol (DUONEB) 0.5-2.5 (3) MG/3ML SOLN, Inhale 3 mLs into the lungs 4 (four) times daily as needed., Disp: , Rfl:  .  montelukast (SINGULAIR) 10 MG tablet, Take 1 tablet (10 mg total) by mouth at bedtime., Disp: 30 tablet, Rfl: 0  Review of Systems  Constitutional: Negative for appetite change, chills, fatigue and fever.  Respiratory: Positive for cough, shortness of breath and wheezing. Negative for chest tightness.   Cardiovascular: Negative for chest pain and palpitations.  Gastrointestinal: Negative for abdominal pain, nausea and vomiting.  Neurological: Negative for dizziness and weakness.    Social History   Tobacco Use  . Smoking status: Former Smoker    Packs/day: 0.25    Years: 45.00    Pack years: 11.25    Types: Cigarettes    Last attempt to quit: 03/17/2016    Years since quitting: 0.9  . Smokeless tobacco: Never Used  . Tobacco comment: previously smoked 2 1/2 ppd for 30 years.   Substance Use Topics  . Alcohol use: No    Alcohol/week: 0.0 oz   Objective:   BP 120/78 (BP Location: Right Arm, Patient Position: Sitting, Cuff Size: Normal)   Pulse 86   Temp 99.2 F (37.3 C) (Oral)   Resp 18   Wt 156 lb (70.8 kg)   SpO2 99% Comment: room air  BMI 31.51 kg/m  Vitals:   03/11/17 1037 03/11/17 1115  BP: 120/78   Pulse: 86   Resp: 18   Temp: 99.2 F (37.3 C)   TempSrc: Oral   SpO2: 99% 94%  Weight: 156 lb (70.8 kg)      Physical Exam  General Appearance:    Alert, cooperative, no distress  HENT:   ENT exam normal, no neck nodes or sinus tenderness  Eyes:    PERRL, conjunctiva/corneas clear, EOM's intact       Lungs:     Occasional expiratory wheeze, no rales, , respirations unlabored  Heart:    Regular rate and rhythm  Neurologic:   Awake,  alert, oriented x 3. No apparent focal neurological           defect.       Oximetry done walking patient around office and down and up stairs and lowest measurement was 94% on room air.     Assessment & Plan:     1. Bronchitis  - albuterol (PROAIR HFA) 108 (90 Base) MCG/ACT inhaler; Take 2 puffs by mouth. - ciprofloxacin (CIPRO) 500 MG tablet; Take 1 tablet (500 mg total) by mouth 2 (two) times daily for 10 days.  Dispense: 20 tablet; Refill: 0  2. Chronic obstructive pulmonary disease, unspecified COPD type ([place]) Measured o2 on with prolonged exertion and she does not qualify for supplemental oxygen.  - albuterol (PROAIR HFA) 108 (90 Base) MCG/ACT inhaler; Take 2 puffs by mouth.       Lelon Huh, MD  [facility]

## 2017-03-12 ENCOUNTER — Encounter: Payer: Self-pay | Admitting: Family Medicine

## 2017-03-12 NOTE — Telephone Encounter (Signed)
Pharmacy requesting refills. Thanks!  

## 2017-03-13 ENCOUNTER — Other Ambulatory Visit: Payer: Self-pay | Admitting: Family Medicine

## 2017-03-13 DIAGNOSIS — G8929 Other chronic pain: Secondary | ICD-10-CM

## 2017-03-13 DIAGNOSIS — M545 Low back pain: Principal | ICD-10-CM

## 2017-03-13 MED ORDER — ALBUTEROL SULFATE HFA 108 (90 BASE) MCG/ACT IN AERS: 2.0000 | INHALATION_SPRAY | Freq: Four times a day (QID) | RESPIRATORY_TRACT | 3 refills | Status: DC | PRN

## 2017-03-13 MED ORDER — ZALEPLON 10 MG PO CAPS: 10.0000 mg | ORAL_CAPSULE | Freq: Every day | ORAL | 4 refills | Status: DC

## 2017-03-13 MED ORDER — OXYCODONE-ACETAMINOPHEN 10-325 MG PO TABS: ORAL_TABLET | ORAL | 0 refills | Status: DC

## 2017-03-13 NOTE — Telephone Encounter (Signed)
Patient needs refill on her Oxycodone 10/325 mg.  To [facility] [address]

## 2017-03-26 ENCOUNTER — Other Ambulatory Visit: Payer: Self-pay | Admitting: Family Medicine

## 2017-03-26 NOTE — Telephone Encounter (Signed)
Patient states that the albuterol (PROAIR HFA) 108 (90 Base) MCG/ACT inhaler   you called in for her she cannot afford it want to know if can call in something cheaper  CB# [phone]

## 2017-03-26 NOTE — Telephone Encounter (Signed)
Please advise 

## 2017-03-27 MED ORDER — ALBUTEROL SULFATE (2.5 MG/3ML) 0.083% IN NEBU: 2.5000 mg | INHALATION_SOLUTION | Freq: Four times a day (QID) | RESPIRATORY_TRACT | 3 refills | Status: DC | PRN

## 2017-03-27 NOTE — Telephone Encounter (Signed)
Patient was notified. Patient stated she will call her insurance and let us know which inhaler they prefer. Patient would like a refill on the albuterol nebulizer solution.

## 2017-03-27 NOTE — Telephone Encounter (Signed)
The inhaler all cost the same. She can check with her insurance to see if they prefer Ventolin, Proair, or Proventil.  The nebulizer solution is much cheaper. Does she need a refill for nebulizer solution?

## 2017-03-31 ENCOUNTER — Other Ambulatory Visit: Payer: Self-pay

## 2017-03-31 ENCOUNTER — Ambulatory Visit
Admission: RE | Admit: 2017-03-31 | Discharge: 2017-03-31 | Disposition: A | Discharge status: Home / Self Care | Payer: Medicare Other | Source: Ambulatory Visit | Attending: Radiation Oncology | Admitting: Radiation Oncology

## 2017-03-31 VITALS — BP 109/79 | HR 106 | Temp 98.0°F | Resp 20 | Wt 154.3 lb

## 2017-03-31 DIAGNOSIS — C3491 Malignant neoplasm of unspecified part of right bronchus or lung: Secondary | ICD-10-CM

## 2017-03-31 DIAGNOSIS — Z923 Personal history of irradiation: Secondary | ICD-10-CM | POA: Diagnosis not present

## 2017-03-31 DIAGNOSIS — R05 Cough: Secondary | ICD-10-CM | POA: Insufficient documentation

## 2017-03-31 DIAGNOSIS — C7931 Secondary malignant neoplasm of brain: Secondary | ICD-10-CM | POA: Diagnosis not present

## 2017-03-31 DIAGNOSIS — Z87891 Personal history of nicotine dependence: Secondary | ICD-10-CM | POA: Diagnosis not present

## 2017-03-31 NOTE — Progress Notes (Signed)
Radiation Oncology Follow up Note  Name: Yvette Riley   Date:   03/31/2017 MRN:  [id number] DOB: Jan 22, 1956    This 62 y.o. female presents to the clinic today for 5 month follow-up status post PCI for limited stage small cell lung cancer.  REFERRING PROVIDER: Birdie Sons, MD  HPI: Patient is a 62 year old female now seen out 5 months having completed PCI for limited stage small cell lung cancer. Should start her treatments. And outside facility received concurrent chemoradiation to her chest. She was seen here and care was taken over and she received PCI 5 months prior. She seen today in routine follow-up and is feeling weak. She states she is losing weight although according to records she is only lost 2 pounds over the past 4 months. She also has a slight cough has been on multiple antibiotic therapies mostly Z-Pak empirically. She specifically denies hemoptysis. She's having no headaches change in visual fields or any focal neurologic deficits.  COMPLICATIONS OF TREATMENT: none  FOLLOW UP COMPLIANCE: keeps appointments   PHYSICAL EXAM:  BP 109/79   Pulse (!) 106   Temp 98 F (36.7 C)   Resp 20   Wt 154 lb 5.2 oz (70 kg)   BMI 31.17 kg/m  Well-developed well-nourished patient in NAD. HEENT reveals PERLA, EOMI, discs not visualized.  Oral cavity is clear. No oral mucosal lesions are identified. Neck is clear without evidence of cervical or supraclavicular adenopathy. Lungs are clear to A&P. Cardiac examination is essentially unremarkable with regular rate and rhythm without murmur rub or thrill. Abdomen is benign with no organomegaly or masses noted. Motor sensory and DTR levels are equal and symmetric in the upper and lower extremities. Cranial nerves II through XII are grossly intact. Proprioception is intact. No peripheral adenopathy or edema is identified. No motor or sensory levels are noted. Crude visual fields are within normal range.  RADIOLOGY RESULTS: No current films  for review series of films to be repeated the end of this month which I will review  PLAN: Present time patient is stable. I anticipate reviewing her repeat MRI of her brain as well as CT scan of her chest when it becomes available. I've otherwise asked to see her back in 6 months for follow-up. I'll be happy to reevaluate the patient any time should palliative treatment be indicated. She continues close follow-up care with Dr. Caryn Section and medical oncology.  I would like to take this opportunity to thank you for allowing me to participate in the care of your patient.Noreene Filbert, MD

## 2017-04-02 ENCOUNTER — Emergency Department: Payer: Medicare Other

## 2017-04-02 ENCOUNTER — Other Ambulatory Visit: Payer: Self-pay

## 2017-04-02 ENCOUNTER — Emergency Department
Admission: EM | Admit: 2017-04-02 | Discharge: 2017-04-02 | Disposition: A | Discharge status: Home / Self Care | Payer: Medicare Other | Attending: Emergency Medicine | Admitting: Emergency Medicine

## 2017-04-02 DIAGNOSIS — Z7982 Long term (current) use of aspirin: Secondary | ICD-10-CM | POA: Insufficient documentation

## 2017-04-02 DIAGNOSIS — E119 Type 2 diabetes mellitus without complications: Secondary | ICD-10-CM | POA: Diagnosis not present

## 2017-04-02 DIAGNOSIS — T18128A Food in esophagus causing other injury, initial encounter: Secondary | ICD-10-CM | POA: Insufficient documentation

## 2017-04-02 DIAGNOSIS — I10 Essential (primary) hypertension: Secondary | ICD-10-CM | POA: Diagnosis not present

## 2017-04-02 DIAGNOSIS — J449 Chronic obstructive pulmonary disease, unspecified: Secondary | ICD-10-CM | POA: Insufficient documentation

## 2017-04-02 DIAGNOSIS — Z79899 Other long term (current) drug therapy: Secondary | ICD-10-CM | POA: Diagnosis not present

## 2017-04-02 DIAGNOSIS — Z7984 Long term (current) use of oral hypoglycemic drugs: Secondary | ICD-10-CM | POA: Insufficient documentation

## 2017-04-02 DIAGNOSIS — Y999 Unspecified external cause status: Secondary | ICD-10-CM | POA: Diagnosis not present

## 2017-04-02 DIAGNOSIS — J45909 Unspecified asthma, uncomplicated: Secondary | ICD-10-CM | POA: Diagnosis not present

## 2017-04-02 DIAGNOSIS — J40 Bronchitis, not specified as acute or chronic: Secondary | ICD-10-CM

## 2017-04-02 DIAGNOSIS — R05 Cough: Secondary | ICD-10-CM | POA: Diagnosis not present

## 2017-04-02 DIAGNOSIS — Y92511 Restaurant or cafe as the place of occurrence of the external cause: Secondary | ICD-10-CM | POA: Diagnosis not present

## 2017-04-02 DIAGNOSIS — Y9389 Activity, other specified: Secondary | ICD-10-CM | POA: Insufficient documentation

## 2017-04-02 DIAGNOSIS — T18108A Unspecified foreign body in esophagus causing other injury, initial encounter: Secondary | ICD-10-CM

## 2017-04-02 DIAGNOSIS — X58XXXA Exposure to other specified factors, initial encounter: Secondary | ICD-10-CM | POA: Insufficient documentation

## 2017-04-02 DIAGNOSIS — Z87891 Personal history of nicotine dependence: Secondary | ICD-10-CM | POA: Insufficient documentation

## 2017-04-02 DIAGNOSIS — R059 Cough, unspecified: Secondary | ICD-10-CM

## 2017-04-02 LAB — COMPREHENSIVE METABOLIC PANEL
ALBUMIN: 4.2 g/dL (ref 3.5–5.0)
ALK PHOS: 106 U/L (ref 38–126)
ALT: 14 U/L (ref 14–54)
ANION GAP: 15 (ref 5–15)
AST: 23 U/L (ref 15–41)
BUN: 9 mg/dL (ref 6–20)
CALCIUM: 9 mg/dL (ref 8.9–10.3)
CHLORIDE: 100 mmol/L — AB (ref 101–111)
CO2: 21 mmol/L — AB (ref 22–32)
Creatinine, Ser: 0.61 mg/dL (ref 0.44–1.00)
GFR calc Af Amer: >60 mL/min (ref >60)
GFR calc non Af Amer: >60 mL/min (ref >60)
GLUCOSE: 224 mg/dL — AB (ref 65–99)
Potassium: 3.5 mmol/L (ref 3.5–5.1)
SODIUM: 136 mmol/L (ref 135–145)
Total Bilirubin: 0.9 mg/dL (ref 0.3–1.2)
Total Protein: 8.2 g/dL — ABNORMAL HIGH (ref 6.5–8.1)

## 2017-04-02 LAB — CBC
HCT: 34.1 % — ABNORMAL LOW (ref 35.0–47.0)
HEMOGLOBIN: 11.3 g/dL — AB (ref 12.0–16.0)
MCH: 29.8 pg (ref 26.0–34.0)
MCHC: 33.1 g/dL (ref 32.0–36.0)
MCV: 90.1 fL (ref 80.0–100.0)
Platelets: 274 10*3/uL (ref 150–440)
RBC: 3.79 MIL/uL — ABNORMAL LOW (ref 3.80–5.20)
RDW: 15.4 % — ABNORMAL HIGH (ref 11.5–14.5)
WBC: 7.8 10*3/uL (ref 3.6–11.0)

## 2017-04-02 LAB — LIPASE, BLOOD: Lipase: 31 U/L (ref 11–51)

## 2017-04-02 MED ORDER — BENZONATATE 100 MG PO CAPS: 100.0000 mg | ORAL_CAPSULE | Freq: Four times a day (QID) | ORAL | 0 refills | Status: DC | PRN

## 2017-04-02 MED ORDER — DOXYCYCLINE HYCLATE 100 MG PO CAPS: 100.0000 mg | ORAL_CAPSULE | Freq: Two times a day (BID) | ORAL | 0 refills | Status: DC

## 2017-04-02 NOTE — Discharge Instructions (Signed)
Be sure to cut all of your food very small do not take big bites if you feel that you have something stuck in there, return to the emergency department, if you feel that you have trouble breathing, make sure that you are around someone who knows the [facility] and can call [phone].  However, we do not anticipate that likely happening.  Follow closely with GI medicine.  If you have increased shortness of breath or cough or other concerns please return to the emergency room.

## 2017-04-02 NOTE — ED Provider Notes (Signed)
[facility] Emergency Department Provider Note  ____________________________________________   I have reviewed the triage vital signs and the nursing notes. Where available I have reviewed prior notes and, if possible and indicated, outside hospital notes.    HISTORY  Chief Complaint No chief complaint on file.    HPI Yvette Riley is a 62 y.o. female states that she was eating a steak house, had a harsh puppy and felt like it got stuck in her throat.  She states that it was hard to swallow.  She denies any chest pain shortness of breath nausea or vomiting.  She states it felt like there was a piece of food stuck in her throat.  She is never had this before, she has not had any endoscopy that she knows of and she is never had any dilatation of her esophagus.  Patient states that her symptoms as of this time are greatly improved.  She states she felt the hospital P go down and she would like to try drinking water.  As a side note, and something that would not have brought her here, she states she has had a productive cough for couple days and she would like antibiotics because of her history of lung disease.    Past Medical History:  Diagnosis Date  . Allergy   . Anemia   . Anxiety   . Arthritis   . Asthma   . Blood transfusion without reported diagnosis   . C. difficile diarrhea 04/07/2016  . Cancer ([place])    lung ca  . CAP (community acquired pneumonia) 03/21/2015  . Combined forms of age-related cataract of both eyes 05/06/2016  . COPD (chronic obstructive pulmonary disease) ([place])   . Diabetes mellitus without complication ([place])   . Displacement of lumbar intervertebral disc without myelopathy   . Emphysema of lung ([place])   . GERD (gastroesophageal reflux disease)   . Glaucoma   . History of chicken pox   . Hypertension   . Lung cancer ([place])   . Pneumonia 03/29/2015  . Shortness of breath     Patient Active Problem List   Diagnosis Date Noted  .  Anxiety 07/19/2016  . Small cell lung cancer, right ([place]) 05/13/2016  . Bilateral presbyopia 05/06/2016  . Combined forms of age-related cataract of both eyes 05/06/2016  . Dry eye syndrome of both lacrimal glands 05/06/2016  . Chemoprophylaxis 04/29/2016  . Severe obesity (BMI 35.0-39.9) with comorbidity ([place]) 04/16/2016  . Elevated d-dimer 04/06/2016  . Weakness generalized 04/06/2016  . Sepsis ([place]) 03/17/2016  . Mass of upper lobe of right lung 03/01/2016  . Coronary atherosclerosis 02/29/2016  . Personal history of tobacco use, presenting hazards to health 02/28/2016  . Allergic rhinitis 09/27/2014  . Asthma 09/27/2014  . Anemia 09/27/2014  . Hyperglyceridemia, pure 09/27/2014  . Glaucoma 09/27/2014  . Hip pain 09/27/2014  . Right knee pain 09/27/2014  . Displacement of lumbar intervertebral disc without myelopathy 09/27/2014  . Thoracic back pain 09/27/2014  . Insomnia 08/11/2014  . COPD (chronic obstructive pulmonary disease) ([place]) 06/12/2014  . Type 2 diabetes mellitus ([place]) 06/12/2014  . Back pain 06/12/2014  . Essential hypertension 03/28/2014  . Smoking greater than 30 pack years 03/28/2014    Past Surgical History:  Procedure Laterality Date  . CESAREAN SECTION    . TUBAL LIGATION      Prior to Admission medications   Medication Sig Start Date End Date Taking? Authorizing Provider  albuterol (PROAIR HFA) 108 (90  Base) MCG/ACT inhaler Inhale 2 puffs into the lungs every 6 (six) hours as needed for wheezing or shortness of breath. 03/13/17   Birdie Sons, MD  albuterol (PROVENTIL) (2.5 MG/3ML) 0.083% nebulizer solution Take 3 mLs (2.5 mg total) by nebulization every 6 (six) hours as needed for wheezing or shortness of breath. Patient not taking: Reported on 03/31/2017 03/27/17   Birdie Sons, MD  aspirin EC 81 MG tablet Take 81 mg by mouth daily.     [provider]  clonazePAM (KLONOPIN) 0.5 MG tablet TAKE 1 TABLET BY MOUTH TWICE DAILY AS NEEDED  03/13/17   Birdie Sons, MD  ezetimibe (ZETIA) 10 MG tablet Take 1 tablet (10 mg total) by mouth daily. Patient not taking: Reported on 03/31/2017 02/05/17   Birdie Sons, MD  ferrous sulfate 325 (65 FE) MG tablet Take 1 tablet (325 mg total) by mouth 2 (two) times daily with a meal. Patient not taking: Reported on 03/31/2017 03/20/16   Vaughan Basta, MD  glucose blood test strip Check blood sugar daily. Dx Type 2 diabetes E11.9 06/07/15   Birdie Sons, MD  ipratropium-albuterol (DUONEB) 0.5-2.5 (3) MG/3ML SOLN Inhale 3 mLs into the lungs 4 (four) times daily as needed. 09/27/16 09/22/17  [provider]  meloxicam (MOBIC) 15 MG tablet Take 1 tablet (15 mg total) by mouth daily as needed for pain. Patient not taking: Reported on 03/31/2017 08/05/16   Birdie Sons, MD  metFORMIN (GLUCOPHAGE XR) 500 MG 24 hr tablet Take 2 tablets (1,000 mg total) by mouth daily with breakfast. With meals 05/29/16   Birdie Sons, MD  mirtazapine (REMERON) 15 MG tablet Take 1 tablet (15 mg total) by mouth at bedtime. 05/29/16   Birdie Sons, MD  montelukast (SINGULAIR) 10 MG tablet Take 1 tablet (10 mg total) by mouth at bedtime. Patient not taking: Reported on 03/31/2017 02/03/17 03/31/17  Arta Silence, MD  Nutritional Supplements ([place]) LIQD Drink 1-2 shakes per day Patient not taking: Reported on 03/31/2017 05/01/16   Mar Daring, PA-C  ondansetron (ZOFRAN) 4 MG tablet Take 1 tablet (4 mg total) by mouth every 8 (eight) hours as needed for nausea or vomiting. Patient not taking: Reported on 03/31/2017 01/01/17   Nance Pear, MD  oxyCODONE-acetaminophen [facility]) 10-325 MG tablet One tablet every 4-6 hours as needed 03/13/17   Birdie Sons, MD  prochlorperazine (COMPAZINE) 10 MG tablet Take 1 tablet by mouth every 6 (six) hours as needed. 05/29/16   [provider]  sucralfate (CARAFATE) 1 g tablet Take 1 tablet (1 g total) by mouth 4 (four) times daily -   with meals and at bedtime. 10/30/16   Noreene Filbert, MD  Tiotropium Bromide-Olodaterol (STIOLTO RESPIMAT) 2.5-2.5 MCG/ACT AERS Inhale 2 puffs into the lungs daily. Patient not taking: Reported on 03/31/2017 02/04/17   Birdie Sons, MD  zaleplon (SONATA) 10 MG capsule Take 1 capsule (10 mg total) by mouth at bedtime. 03/13/17   Birdie Sons, MD    Allergies Codeine; Crestor [rosuvastatin]; Other; Penicillins; Yellow jacket venom [bee venom]; Gemfibrozil; Statins; Trazodone and nefazodone; and Lipitor [atorvastatin]  Family History  Problem Relation Age of Onset  . Heart failure Mother   . Heart disease Mother   . Stroke Mother   . Heart disease Brother   . COPD Brother   . Cancer Maternal Aunt        Breast Cancer    Social History Social History  Tobacco Use  . Smoking status: Former Smoker    Packs/day: 0.25    Years: 45.00    Pack years: 11.25    Types: Cigarettes    Last attempt to quit: 03/17/2016    Years since quitting: 1.0  . Smokeless tobacco: Never Used  . Tobacco comment: previously smoked 2 1/2 ppd for 30 years.   Substance Use Topics  . Alcohol use: No    Alcohol/week: 0.0 oz  . Drug use: No    Review of Systems Constitutional: No fever/chills Eyes: No visual changes. ENT: No sore throat. No stiff neck no neck pain Cardiovascular: Denies chest pain. Respiratory: Denies shortness of breath. Gastrointestinal:   no vomiting.  No diarrhea.  No constipation. Genitourinary: Negative for dysuria. Musculoskeletal: Negative lower extremity swelling Skin: Negative for rash. Neurological: Negative for severe headaches, focal weakness or numbness.   ____________________________________________   PHYSICAL EXAM:  VITAL SIGNS: ED Triage Vitals  Enc Vitals Group     BP 04/02/17 1651 (!) 142/81     Pulse Rate 04/02/17 1651 (!) 120     Resp 04/02/17 1651 20     Temp 04/02/17 1651 98.8 F (37.1 C)     Temp Source 04/02/17 1651 Oral     SpO2 04/02/17 1651  100 %     Weight 04/02/17 1652 150 lb (68 kg)     Height 04/02/17 1652 4\' 11"  (1.499 m)     Head Circumference --      Peak Flow --      Pain Score 04/02/17 1652 0     Pain Loc --      Pain Edu? --      Excl. in [place]? --     Constitutional: Alert and oriented. Well appearing and in no acute distress. Eyes: Conjunctivae are normal Head: Atraumatic HEENT: No congestion/rhinnorhea. Mucous membranes are moist.  Oropharynx non-erythematous Neck:   Nontender with no meningismus, no masses, no stridor Cardiovascular: Normal rate, regular rhythm. Grossly normal heart sounds.  Good peripheral circulation. Respiratory: Normal respiratory effort.  No retractions. Lungs CTAB. Abdominal: Soft and nontender. No distention. No guarding no rebound Back:  There is no focal tenderness or step off.  there is no midline tenderness there are no lesions noted. there is no CVA tenderness Musculoskeletal: No lower extremity tenderness, no upper extremity tenderness. No joint effusions, no DVT signs strong distal pulses no edema Neurologic:  Normal speech and language. No gross focal neurologic deficits are appreciated.  Skin:  Skin is warm, dry and intact. No rash noted. Psychiatric: Mood and affect are normal. Speech and behavior are normal.  ____________________________________________   LABS (all labs ordered are listed, but only abnormal results are displayed)  Labs Reviewed  COMPREHENSIVE METABOLIC PANEL - Abnormal; Notable for the following components:      Result Value   Chloride 100 (*)    CO2 21 (*)    Glucose, Bld 224 (*)    Total Protein 8.2 (*)    All other components within normal limits  CBC - Abnormal; Notable for the following components:   RBC 3.79 (*)    Hemoglobin 11.3 (*)    HCT 34.1 (*)    RDW 15.4 (*)    All other components within normal limits  LIPASE, BLOOD    Pertinent labs  results that were available during my care of the patient were reviewed by me and considered in  my medical decision making (see chart for details). ____________________________________________  EKG  I personally interpreted any EKGs ordered by me or triage  ____________________________________________  RADIOLOGY  Pertinent labs & imaging results that were available during my care of the patient were reviewed by me and considered in my medical decision making (see chart for details). If possible, patient and/or family made aware of any abnormal findings.  Dg Neck Soft Tissue  Result Date: 04/02/2017 CLINICAL DATA:  Feels like something is stuck in the throat EXAM: NECK SOFT TISSUES - 1+ VIEW COMPARISON:  None. FINDINGS: Prevertebral soft tissue thickness is within normal limits. No retropharyngeal gas. No radiopaque foreign body. Mild reversal of cervical lordosis with degenerative changes at C5-C6 and C6-C7. IMPRESSION: Negative.  Degenerative changes of the cervical spine. Electronically Signed   By: Donavan Foil M.D.   On: 04/02/2017 18:00   Dg Chest 2 View  Result Date: 04/02/2017 CLINICAL DATA:  Coughing, feels like something stuck in throat EXAM: CHEST - 2 VIEW COMPARISON:  CT of 12/26/2016, radiograph 12/04/2016 FINDINGS: Similar appearance of right hilar opacity and bandlike opacity in the right suprahilar lung, compatible with post treatment changes. Scarring at the right lung base. No acute infiltrate or effusion. Stable mild cardiomediastinal enlargement. No pneumothorax. IMPRESSION: No active cardiopulmonary disease. Similar post treatment changes of the right thorax. Cardiomegaly. Electronically Signed   By: Donavan Foil M.D.   On: 04/02/2017 17:59    Personally reviewed both x-rays ____________________________________________    PROCEDURES  Procedure(s) performed: None  Procedures  Critical Care performed: None  ____________________________________________   INITIAL IMPRESSION / ASSESSMENT AND PLAN / ED COURSE  Pertinent labs & imaging results that were  available during my care of the patient were reviewed by me and considered in my medical decision making (see chart for details).  Patient here with 2 different complaints of first is that she apparently had something stuck in her throat but that is gone now she has no complaints of foreign body sensation she is able to drink and take pills with no difficulty.  We will refer her as an outpatient GI have advised her to cut her food very finely I see no evidence of mass or other issue at this time on x-ray, I do not think CT is warranted.  Patient apparently had a very large bite she states. Return precautions given and understood for this Patient also has a productive cough.  She does have occasional rhonchi although when she clears her lungs are clear.  Her sats are in the 90s she is not working hard to breathe given her history of lung cancer etc. however I will give her a course of antibiotics as I do detect a productive cough in a patient who could deteriorate.  Initial heart rate was somewhat elevated, patient was angry and upset initially, says she comes then her heart rate goes down.  When I enter the room however she seems to get a little bit more agitated she states she wants to go home.  We did do basic blood work as a precaution which is thus far reassuring, imaging is reassuring and we will get her safely home with return precautions.  I do not think that this has crushed poppy stuck in her throat represents ACS PE or dissection or referred pain.     ____________________________________________   FINAL CLINICAL IMPRESSION(S) / ED DIAGNOSES  Final diagnoses:  None      This chart was dictated using voice recognition software.  Despite best efforts to proofread,  errors can occur which can  change meaning.      Schuyler Amor, MD 04/02/17 [id number]

## 2017-04-02 NOTE — ED Triage Notes (Signed)
Pt states she ate hushpuppies and baked potato about 2 hrs ago. About 20 minutes after pt began feeling sick. Can swallow saliva but is spitting up in triage. Alert, oriented, talking in complete sentences.

## 2017-04-03 ENCOUNTER — Other Ambulatory Visit: Payer: Self-pay | Admitting: Family Medicine

## 2017-04-03 NOTE — Telephone Encounter (Signed)
Pt requesting a refill of Cipro 500 MG ???  Pt states he normally gives her RX if she needs it without an appt. Pt states she was seen in ED last night.

## 2017-04-04 NOTE — Telephone Encounter (Signed)
She was just prescribed doxycycline on 04-02-2017. Need to give it4-5 days to help.  Also, there is no information in this message, why is she asking for medication?

## 2017-04-04 NOTE — Telephone Encounter (Signed)
LMOVM for pt to return call 

## 2017-04-04 NOTE — Telephone Encounter (Signed)
Please review. Ok to refill?  

## 2017-04-07 ENCOUNTER — Other Ambulatory Visit: Payer: Self-pay | Admitting: Family Medicine

## 2017-04-07 DIAGNOSIS — M545 Low back pain: Principal | ICD-10-CM

## 2017-04-07 DIAGNOSIS — G8929 Other chronic pain: Secondary | ICD-10-CM

## 2017-04-07 MED ORDER — OXYCODONE-ACETAMINOPHEN 10-325 MG PO TABS: ORAL_TABLET | ORAL | 0 refills | Status: DC

## 2017-04-07 NOTE — Telephone Encounter (Signed)
Patient has switched from [place] to [place].    She uses [facility], [address] And needs the Oxycodone  10-325 mg. Sent in.

## 2017-04-08 MED ORDER — LEVOFLOXACIN 750 MG PO TABS: 750.0000 mg | ORAL_TABLET | Freq: Every day | ORAL | 0 refills | Status: AC

## 2017-04-08 NOTE — Telephone Encounter (Signed)
Prescription for levofloxacin has been sent to [facility]

## 2017-04-08 NOTE — Telephone Encounter (Signed)
Patient states she was prescribed doxycycline and tessalon pearls when she was in the ER 04/02/2017, for productive cough. Patient states she not any better, still having productive cough and some wheezing. Patient is requesting a different antibiotic. Please advise?

## 2017-04-09 NOTE — Telephone Encounter (Signed)
Patient was notified. Expressed understanding.  

## 2017-04-16 ENCOUNTER — Ambulatory Visit: Admission: RE | Admit: 2017-04-16 | Payer: Medicare Other | Source: Ambulatory Visit

## 2017-04-20 NOTE — Progress Notes (Deleted)
Yvette Riley  [phone] [phone]  ID: Yvette Riley OB: 1955-05-18  MR#: [id number]  PJA#:[id number]  Patient Care Team: Birdie Sons, MD as PCP - General (Family Medicine) Dionisio David, MD (Cardiology)  CHIEF COMPLAINT: Clinical stage IIIB small cell lung cancer of the right lung.  INTERVAL HISTORY: Patient returns to clinic today for further evaluation and discussion of her imaging results. She continues to be anxious, but otherwise feels well. She has a chronic cough that is unchanged. She has no neurologic complaints. She denies any recent fevers or illnesses. She has a good appetite and denies weight loss. She has no chest pain, shortness of breath, or hemoptysis. She denies any nausea, vomiting, constipation, or diarrhea. She has no urinary complaints. Patient offers no further specific complaints.  REVIEW OF SYSTEMS:   Review of Systems  Constitutional: Negative.  Negative for fever, malaise/fatigue and weight loss.  Respiratory: Positive for cough. Negative for hemoptysis and shortness of breath.   Cardiovascular: Negative.  Negative for chest pain and leg swelling.  Gastrointestinal: Negative.  Negative for abdominal pain.  Genitourinary: Negative.   Musculoskeletal: Negative.   Skin: Negative.  Negative for rash.  Neurological: Negative.  Negative for sensory change and weakness.  Psychiatric/Behavioral: The patient is nervous/anxious.     As per HPI. Otherwise, a complete review of systems is negative.  PAST MEDICAL HISTORY: Past Medical History:  Diagnosis Date  . Allergy   . Anemia   . Anxiety   . Arthritis   . Asthma   . Blood transfusion without reported diagnosis   . C. difficile diarrhea 04/07/2016  . Cancer ([place])    lung ca  . CAP (community acquired pneumonia) 03/21/2015  . Combined forms of age-related cataract of both eyes 05/06/2016  . COPD (chronic obstructive pulmonary disease) ([place])   . Diabetes  mellitus without complication ([place])   . Displacement of lumbar intervertebral disc without myelopathy   . Emphysema of lung ([place])   . GERD (gastroesophageal reflux disease)   . Glaucoma   . History of chicken pox   . Hypertension   . Lung cancer ([place])   . Pneumonia 03/29/2015  . Shortness of breath     PAST SURGICAL HISTORY: Past Surgical History:  Procedure Laterality Date  . CESAREAN SECTION    . TUBAL LIGATION      FAMILY HISTORY: Family History  Problem Relation Age of Onset  . Heart failure Mother   . Heart disease Mother   . Stroke Mother   . Heart disease Brother   . COPD Brother   . Cancer Maternal Aunt        Breast Cancer    ADVANCED DIRECTIVES (Y/N):  N  HEALTH MAINTENANCE: Social History   Tobacco Use  . Smoking status: Former Smoker    Packs/day: 0.25    Years: 45.00    Pack years: 11.25    Types: Cigarettes    Last attempt to quit: 03/17/2016    Years since quitting: 1.0  . Smokeless tobacco: Never Used  . Tobacco comment: previously smoked 2 1/2 ppd for 30 years.   Substance Use Topics  . Alcohol use: No    Alcohol/week: 0.0 oz  . Drug use: No     Colonoscopy:  PAP:  Bone density:  Lipid panel:  Allergies  Allergen Reactions  . Codeine Anaphylaxis  . Crestor [Rosuvastatin] Anaphylaxis  . Other Anaphylaxis  . Penicillins Anaphylaxis, Rash and Other (See Comments)  Reaction:  Tongue swelling  Has patient had a PCN reaction causing immediate rash, facial/tongue/throat swelling, SOB or lightheadedness with hypotension:  Yes   Has patient had a PCN reaction causing severe rash involving mucus membranes or skin necrosis: No Has patient had a PCN reaction that required hospitalization No Has patient had a PCN reaction occurring within the last 10 years: No If all of the above answers are "NO", then may proceed with Cephalosporin use.  . Yellow Jacket Venom [Bee Venom] Anaphylaxis  . Gemfibrozil Rash, Nausea And Vomiting and Swelling  .  Statins Nausea And Vomiting and Swelling  . Trazodone And Nefazodone Nausea And Vomiting  . Lipitor [Atorvastatin] Rash    Current Outpatient Medications  Medication Sig Dispense Refill  . albuterol (PROAIR HFA) 108 (90 Base) MCG/ACT inhaler Inhale 2 puffs into the lungs every 6 (six) hours as needed for wheezing or shortness of breath. 1 Inhaler 3  . albuterol (PROVENTIL) (2.5 MG/3ML) 0.083% nebulizer solution Take 3 mLs (2.5 mg total) by nebulization every 6 (six) hours as needed for wheezing or shortness of breath. (Patient not taking: Reported on 03/31/2017) 75 mL 3  . aspirin EC 81 MG tablet Take 81 mg by mouth daily.     . benzonatate (TESSALON PERLES) 100 MG capsule Take 1 capsule (100 mg total) by mouth every 6 (six) hours as needed for cough. 30 capsule 0  . clonazePAM (KLONOPIN) 0.5 MG tablet TAKE 1 TABLET BY MOUTH TWICE DAILY AS NEEDED 60 tablet 3  . doxycycline (VIBRAMYCIN) 100 MG capsule Take 1 capsule (100 mg total) by mouth 2 (two) times daily. 14 capsule 0  . ezetimibe (ZETIA) 10 MG tablet Take 1 tablet (10 mg total) by mouth daily. (Patient not taking: Reported on 03/31/2017) 30 tablet 3  . ferrous sulfate 325 (65 FE) MG tablet Take 1 tablet (325 mg total) by mouth 2 (two) times daily with a meal. (Patient not taking: Reported on 03/31/2017) 30 tablet 3  . glucose blood test strip Check blood sugar daily. Dx Type 2 diabetes E11.9 100 each 3  . ipratropium-albuterol (DUONEB) 0.5-2.5 (3) MG/3ML SOLN Inhale 3 mLs into the lungs 4 (four) times daily as needed.    . meloxicam (MOBIC) 15 MG tablet Take 1 tablet (15 mg total) by mouth daily as needed for pain. (Patient not taking: Reported on 03/31/2017) 90 tablet 2  . metFORMIN (GLUCOPHAGE XR) 500 MG 24 hr tablet Take 2 tablets (1,000 mg total) by mouth daily with breakfast. With meals 1 tablet 1  . mirtazapine (REMERON) 15 MG tablet Take 1 tablet (15 mg total) by mouth at bedtime. 30 tablet 3  . montelukast (SINGULAIR) 10 MG tablet Take 1  tablet (10 mg total) by mouth at bedtime. (Patient not taking: Reported on 03/31/2017) 30 tablet 0  . Nutritional Supplements (GLUCERNA ADVANCE SHAKE) LIQD Drink 1-2 shakes per day (Patient not taking: Reported on 03/31/2017) 24 Bottle 12  . ondansetron (ZOFRAN) 4 MG tablet Take 1 tablet (4 mg total) by mouth every 8 (eight) hours as needed for nausea or vomiting. (Patient not taking: Reported on 03/31/2017) 20 tablet 0  . oxyCODONE-acetaminophen (PERCOCET) 10-325 MG tablet One tablet every 4-6 hours as needed 180 tablet 0  . prochlorperazine (COMPAZINE) 10 MG tablet Take 1 tablet by mouth every 6 (six) hours as needed.    . sucralfate (CARAFATE) 1 g tablet Take 1 tablet (1 g total) by mouth 4 (four) times daily -  with meals and at bedtime.  120 tablet 4  . Tiotropium Bromide-Olodaterol (STIOLTO RESPIMAT) 2.5-2.5 MCG/ACT AERS Inhale 2 puffs into the lungs daily. (Patient not taking: Reported on 03/31/2017) 3 Inhaler 4  . zaleplon (SONATA) 10 MG capsule Take 1 capsule (10 mg total) by mouth at bedtime. 30 capsule 4   No current facility-administered medications for this visit.     OBJECTIVE: There were no vitals filed for this visit.   There is no height or weight on file to calculate BMI.    ECOG FS:0 - Asymptomatic  General: Well-developed, well-nourished, no acute distress. Eyes: Pink conjunctiva, anicteric sclera. Lungs: Clear to auscultation bilaterally. Heart: Regular rate and rhythm. No rubs, murmurs, or gallops. Abdomen: Soft, nontender, nondistended. No organomegaly noted, normoactive bowel sounds. Musculoskeletal: No edema, cyanosis, or clubbing. Neuro: Alert, answering all questions appropriately. Cranial nerves grossly intact. Skin: No rashes or petechiae noted. Psych: Normal affect.   LAB RESULTS:  Lab Results  Component Value Date   NA 136 04/02/2017   K 3.5 04/02/2017   CL 100 (L) 04/02/2017   CO2 21 (L) 04/02/2017   GLUCOSE 224 (H) 04/02/2017   BUN 9 04/02/2017   CREATININE  0.61 04/02/2017   CALCIUM 9.0 04/02/2017   PROT 8.2 (H) 04/02/2017   ALBUMIN 4.2 04/02/2017   AST 23 04/02/2017   ALT 14 04/02/2017   ALKPHOS 106 04/02/2017   BILITOT 0.9 04/02/2017   GFRNONAA >60 04/02/2017   GFRAA >60 04/02/2017    Lab Results  Component Value Date   WBC 7.8 04/02/2017   NEUTROABS 5.8 02/03/2017   HGB 11.3 (L) 04/02/2017   HCT 34.1 (L) 04/02/2017   MCV 90.1 04/02/2017   PLT 274 04/02/2017     STUDIES: Dg Neck Soft Tissue  Result Date: 04/02/2017 CLINICAL DATA:  Feels like something is stuck in the throat EXAM: NECK SOFT TISSUES - 1+ VIEW COMPARISON:  None. FINDINGS: Prevertebral soft tissue thickness is within normal limits. No retropharyngeal gas. No radiopaque foreign body. Mild reversal of cervical lordosis with degenerative changes at C5-C6 and C6-C7. IMPRESSION: Negative.  Degenerative changes of the cervical spine. Electronically Signed   By: Donavan Foil M.D.   On: 04/02/2017 18:00   Dg Chest 2 View  Result Date: 04/02/2017 CLINICAL DATA:  Coughing, feels like something stuck in throat EXAM: CHEST - 2 VIEW COMPARISON:  CT of 12/26/2016, radiograph 12/04/2016 FINDINGS: Similar appearance of right hilar opacity and bandlike opacity in the right suprahilar lung, compatible with post treatment changes. Scarring at the right lung base. No acute infiltrate or effusion. Stable mild cardiomediastinal enlargement. No pneumothorax. IMPRESSION: No active cardiopulmonary disease. Similar post treatment changes of the right thorax. Cardiomegaly. Electronically Signed   By: Donavan Foil M.D.   On: 04/02/2017 17:59    ASSESSMENT: Clinical stage IIIB small cell lung cancer of the right lung.  PLAN:    1. Clinical stage IIIB small cell lung cancer of the right lung: Patient completed concurrent chemotherapy and XRT at outside facility and then transferred care to [facility] to complete treatment and long-term monitoring for recurrence. She completed cycle 6 of carboplatinum  and etoposide on August 15, 2016.  She also recently completed PCI.  CT scan results from December 26, 2016 reviewed independently and reported as above with continued improvement of patient's disease burden as well as right hilar mass.  No intervention is needed at this time. Return to clinic in 3 months with repeat imaging using CT scan and further evaluation.  If there is continued  improvement of her imaging, can consider changing imaging to every 6 months.   2. Anemia: Resolved. 3.  Elevated bilirubin: Ultrasound results as above, monitor.  Approximately 30 minutes was spent in discussion of which greater than 50% was consultation.  Patient expressed understanding and was in agreement with this plan. She also understands that She can call clinic at any time with any questions, concerns, or complaints.   Cancer Staging Small cell lung cancer, right [facility]) Staging form: Lung, AJCC 8th Edition - Clinical stage from 08/18/2016: Stage IIIB (cT2a, cN3, cM0) - Signed by Lloyd Huger, MD on 08/18/2016   Lloyd Huger, MD   04/20/2017 10:52 PM

## 2017-04-24 ENCOUNTER — Encounter: Payer: Self-pay | Admitting: Oncology

## 2017-04-24 ENCOUNTER — Inpatient Hospital Stay: Payer: Medicare Other | Admitting: Oncology

## 2017-04-25 ENCOUNTER — Ambulatory Visit
Admission: RE | Admit: 2017-04-25 | Discharge: 2017-04-25 | Disposition: A | Discharge status: Home / Self Care | Payer: Medicare Other | Source: Ambulatory Visit | Attending: Oncology | Admitting: Oncology

## 2017-04-25 DIAGNOSIS — C3490 Malignant neoplasm of unspecified part of unspecified bronchus or lung: Secondary | ICD-10-CM | POA: Diagnosis not present

## 2017-04-25 DIAGNOSIS — C3491 Malignant neoplasm of unspecified part of right bronchus or lung: Secondary | ICD-10-CM | POA: Diagnosis not present

## 2017-04-25 DIAGNOSIS — R918 Other nonspecific abnormal finding of lung field: Secondary | ICD-10-CM | POA: Insufficient documentation

## 2017-04-25 DIAGNOSIS — I700 Atherosclerosis of aorta: Secondary | ICD-10-CM | POA: Diagnosis not present

## 2017-04-25 DIAGNOSIS — I2510 Atherosclerotic heart disease of native coronary artery without angina pectoris: Secondary | ICD-10-CM | POA: Diagnosis not present

## 2017-04-25 MED ORDER — IOPAMIDOL (ISOVUE-300) INJECTION 61%
75.0000 mL | Freq: Once | INTRAVENOUS | Status: AC | PRN
  Administered 2017-04-25: 75 mL via INTRAVENOUS

## 2017-04-30 ENCOUNTER — Other Ambulatory Visit: Payer: Self-pay | Admitting: Family Medicine

## 2017-04-30 DIAGNOSIS — M545 Low back pain: Principal | ICD-10-CM

## 2017-04-30 DIAGNOSIS — G8929 Other chronic pain: Secondary | ICD-10-CM

## 2017-04-30 MED ORDER — OXYCODONE-ACETAMINOPHEN 10-325 MG PO TABS: ORAL_TABLET | ORAL | 0 refills | Status: DC

## 2017-04-30 NOTE — Telephone Encounter (Signed)
Pt calling to request a refill on the following medication. [facility] [address]  oxyCODONE-acetaminophen (PERCOCET) 10-325 MG tablet

## 2017-05-01 ENCOUNTER — Other Ambulatory Visit: Payer: Self-pay

## 2017-05-01 DIAGNOSIS — R05 Cough: Secondary | ICD-10-CM

## 2017-05-01 DIAGNOSIS — R059 Cough, unspecified: Secondary | ICD-10-CM

## 2017-05-01 NOTE — Telephone Encounter (Signed)
Please advise 

## 2017-05-01 NOTE — Telephone Encounter (Signed)
Patient is requesting a refill on Benzonatate be sent to [place]. Patient states she was prescribed some on 04/02/17 at the ER and they helped with her cough. Now the cough is back and she is requesting a refill. Advised patient that she would probably need a OV for evaluation. Please advise. CB# [phone]

## 2017-05-02 NOTE — Telephone Encounter (Signed)
[facility]? Can send pended prescription to whichever one she wants to use.

## 2017-05-07 ENCOUNTER — Other Ambulatory Visit: Payer: Self-pay | Admitting: Family Medicine

## 2017-05-07 NOTE — Telephone Encounter (Signed)
OK to fill early this month.

## 2017-05-07 NOTE — Telephone Encounter (Signed)
Pt needs Oxycodone 10-325 mg. Today.  She can barely move and has to be in court in [employer] building today in [place] at 11:30 a.m.   She is desperate she said.  [facility] [address]  will not refill it because it's 3 days early she said.    As I am typing the message, [facility] calls and states she is on day 25 of 30 and they cannot fill this until 05/10/17 unless you tell them its ok to fill early.

## 2017-05-07 NOTE — Telephone Encounter (Signed)
Pharmacy advised  

## 2017-05-07 NOTE — Telephone Encounter (Signed)
Please advise 

## 2017-05-08 MED ORDER — BENZONATATE 100 MG PO CAPS: 100.0000 mg | ORAL_CAPSULE | Freq: Three times a day (TID) | ORAL | 1 refills | Status: DC | PRN

## 2017-05-08 NOTE — Telephone Encounter (Signed)
Dr. Caryn Section patient needs this sent to [facility] ([address] I tried to send this medication in but a warning box came up for High dose. It states the frequency of 4 doses/day exceeds the reccommended maximum of 3 doses/ day. Do you want to change the frequency?

## 2017-05-08 NOTE — Telephone Encounter (Signed)
Tried calling patient to see if she still needs this medication. No answer. Left message to call back.

## 2017-05-30 ENCOUNTER — Other Ambulatory Visit: Payer: Self-pay | Admitting: Family Medicine

## 2017-05-30 DIAGNOSIS — M545 Low back pain: Principal | ICD-10-CM

## 2017-05-30 DIAGNOSIS — G8929 Other chronic pain: Secondary | ICD-10-CM

## 2017-05-30 NOTE — Telephone Encounter (Signed)
Pt will need a refill on May 10 th for her oxycodone 10-325  She uses [address]  Her call  Back is [phone]  Thanks [facility]

## 2017-05-30 NOTE — Telephone Encounter (Signed)
Please review. Thanks!  

## 2017-05-31 ENCOUNTER — Ambulatory Visit (INDEPENDENT_AMBULATORY_CARE_PROVIDER_SITE_OTHER): Payer: Medicare Other | Admitting: Physician Assistant

## 2017-05-31 ENCOUNTER — Encounter: Payer: Self-pay | Admitting: Physician Assistant

## 2017-05-31 DIAGNOSIS — M545 Low back pain: Secondary | ICD-10-CM | POA: Diagnosis not present

## 2017-05-31 DIAGNOSIS — R05 Cough: Secondary | ICD-10-CM

## 2017-05-31 DIAGNOSIS — R059 Cough, unspecified: Secondary | ICD-10-CM

## 2017-05-31 DIAGNOSIS — G8929 Other chronic pain: Secondary | ICD-10-CM

## 2017-05-31 MED ORDER — BENZONATATE 100 MG PO CAPS: 100.0000 mg | ORAL_CAPSULE | Freq: Three times a day (TID) | ORAL | 1 refills | Status: DC | PRN

## 2017-05-31 MED ORDER — OXYCODONE-ACETAMINOPHEN 10-325 MG PO TABS: ORAL_TABLET | ORAL | 0 refills | Status: DC

## 2017-05-31 NOTE — Progress Notes (Signed)
Patient: Yvette Riley Female    DOB: 08-Feb-1955   62 y.o.   MRN: [id number] Visit Date: 05/31/2017  Today's Provider: Mar Daring, PA-C   Chief Complaint  Patient presents with  . Back Pain    chronic  . Cough    x 2 weeks   Subjective:    Back Pain  This is a chronic problem. The current episode started more than 1 year ago. The problem occurs constantly. The problem has been gradually worsening since onset. The pain is present in the lumbar spine. Pertinent negatives include no abdominal pain, chest pain, fever or weakness.  Cough  This is a new problem. Episode onset: 2 weeks ago. The problem has been gradually worsening. The cough is productive of sputum. Associated symptoms include rhinorrhea, shortness of breath and wheezing. Pertinent negatives include no chest pain, chills, fever or postnasal drip. Treatments tried: [employer]. The treatment provided mild relief. Her past medical history is significant for asthma, COPD and emphysema. also lung cancer   Patient comes in today wanting a refill on her oxycodone. Patient requested a refill yesterday and only has one pill left.      Allergies  Allergen Reactions  . Codeine Anaphylaxis  . Crestor [Rosuvastatin] Anaphylaxis  . Other Anaphylaxis  . Penicillins Anaphylaxis, Rash and Other (See Comments)    Reaction:  Tongue swelling  Has patient had a PCN reaction causing immediate rash, facial/tongue/throat swelling, SOB or lightheadedness with hypotension:  Yes   Has patient had a PCN reaction causing severe rash involving mucus membranes or skin necrosis: No Has patient had a PCN reaction that required hospitalization No Has patient had a PCN reaction occurring within the last 10 years: No If all of the above answers are "NO", then may proceed with Cephalosporin use.  . Yellow Jacket Venom [Bee Venom] Anaphylaxis  . Gemfibrozil Rash, Nausea And Vomiting and Swelling  . Statins Nausea And Vomiting and  Swelling  . Trazodone And Nefazodone Nausea And Vomiting  . Lipitor [Atorvastatin] Rash     Current Outpatient Medications:  .  albuterol (PROAIR HFA) 108 (90 Base) MCG/ACT inhaler, Inhale 2 puffs into the lungs every 6 (six) hours as needed for wheezing or shortness of breath., Disp: 1 Inhaler, Rfl: 3 .  albuterol (PROVENTIL) (2.5 MG/3ML) 0.083% nebulizer solution, Take 3 mLs (2.5 mg total) by nebulization every 6 (six) hours as needed for wheezing or shortness of breath., Disp: 75 mL, Rfl: 3 .  aspirin EC 81 MG tablet, Take 81 mg by mouth daily. , Disp: , Rfl:  .  benzonatate (TESSALON PERLES) 100 MG capsule, Take 1 capsule (100 mg total) by mouth 3 (three) times daily as needed for cough., Disp: 30 capsule, Rfl: 1 .  clonazePAM (KLONOPIN) 0.5 MG tablet, TAKE 1 TABLET BY MOUTH TWICE DAILY AS NEEDED, Disp: 60 tablet, Rfl: 3 .  doxycycline (VIBRAMYCIN) 100 MG capsule, Take 1 capsule (100 mg total) by mouth 2 (two) times daily., Disp: 14 capsule, Rfl: 0 .  ezetimibe (ZETIA) 10 MG tablet, Take 1 tablet (10 mg total) by mouth daily., Disp: 30 tablet, Rfl: 3 .  ferrous sulfate 325 (65 FE) MG tablet, Take 1 tablet (325 mg total) by mouth 2 (two) times daily with a meal., Disp: 30 tablet, Rfl: 3 .  glucose blood test strip, Check blood sugar daily. Dx Type 2 diabetes E11.9, Disp: 100 each, Rfl: 3 .  ipratropium-albuterol (DUONEB) 0.5-2.5 (3) MG/3ML SOLN, Inhale  3 mLs into the lungs 4 (four) times daily as needed., Disp: , Rfl:  .  meloxicam (MOBIC) 15 MG tablet, Take 1 tablet (15 mg total) by mouth daily as needed for pain., Disp: 90 tablet, Rfl: 2 .  metFORMIN (GLUCOPHAGE XR) 500 MG 24 hr tablet, Take 2 tablets (1,000 mg total) by mouth daily with breakfast. With meals, Disp: 1 tablet, Rfl: 1 .  mirtazapine (REMERON) 15 MG tablet, Take 1 tablet (15 mg total) by mouth at bedtime., Disp: 30 tablet, Rfl: 3 .  Nutritional Supplements (GLUCERNA ADVANCE SHAKE) LIQD, Drink 1-2 shakes per day, Disp: 24  Bottle, Rfl: 12 .  ondansetron (ZOFRAN) 4 MG tablet, Take 1 tablet (4 mg total) by mouth every 8 (eight) hours as needed for nausea or vomiting., Disp: 20 tablet, Rfl: 0 .  oxyCODONE-acetaminophen (PERCOCET) 10-325 MG tablet, One tablet every 4-6 hours as needed, Disp: 180 tablet, Rfl: 0 .  prochlorperazine (COMPAZINE) 10 MG tablet, Take 1 tablet by mouth every 6 (six) hours as needed., Disp: , Rfl:  .  sucralfate (CARAFATE) 1 g tablet, Take 1 tablet (1 g total) by mouth 4 (four) times daily -  with meals and at bedtime., Disp: 120 tablet, Rfl: 4 .  Tiotropium Bromide-Olodaterol (STIOLTO RESPIMAT) 2.5-2.5 MCG/ACT AERS, Inhale 2 puffs into the lungs daily., Disp: 3 Inhaler, Rfl: 4 .  zaleplon (SONATA) 10 MG capsule, Take 1 capsule (10 mg total) by mouth at bedtime., Disp: 30 capsule, Rfl: 4 .  montelukast (SINGULAIR) 10 MG tablet, Take 1 tablet (10 mg total) by mouth at bedtime. (Patient not taking: Reported on 03/31/2017), Disp: 30 tablet, Rfl: 0  Review of Systems  Constitutional: Negative for appetite change, chills, fatigue and fever.  HENT: Positive for rhinorrhea. Negative for postnasal drip.   Respiratory: Positive for cough (productive yellow colored), shortness of breath and wheezing. Negative for chest tightness.   Cardiovascular: Negative for chest pain and palpitations.  Gastrointestinal: Negative for abdominal pain, nausea and vomiting.  Musculoskeletal: Positive for back pain.  Neurological: Negative for dizziness and weakness.    Social History   Tobacco Use  . Smoking status: Former Smoker    Packs/day: 0.25    Years: 45.00    Pack years: 11.25    Types: Cigarettes    Last attempt to quit: 03/17/2016    Years since quitting: 1.2  . Smokeless tobacco: Never Used  . Tobacco comment: previously smoked 2 1/2 ppd for 30 years.   Substance Use Topics  . Alcohol use: No    Alcohol/week: 0.0 oz   Objective:   BP 102/70 (BP Location: Right Arm, Patient Position: Sitting, Cuff  Size: Normal)   Pulse 95   Temp 98.5 F (36.9 C) (Oral)   Resp 20   Wt 151 lb (68.5 kg)   SpO2 96% Comment: room air  BMI 30.50 kg/m  There were no vitals filed for this visit.   Physical Exam  Constitutional: She appears well-developed and well-nourished. No distress.  HENT:  Head: Normocephalic and atraumatic.  Right Ear: Hearing, tympanic membrane, external ear and ear canal normal.  Left Ear: Hearing, tympanic membrane, external ear and ear canal normal.  Nose: Nose normal.  Mouth/Throat: Uvula is midline, oropharynx is clear and moist and mucous membranes are normal. No oropharyngeal exudate.  Eyes: Pupils are equal, round, and reactive to light. Conjunctivae are normal. Right eye exhibits no discharge. Left eye exhibits no discharge. No scleral icterus.  Neck: Normal range of motion. Neck supple.  No tracheal deviation present. No thyromegaly present.  Cardiovascular: Normal rate, regular rhythm and normal heart sounds. Exam reveals no gallop and no friction rub.  No murmur heard. Pulmonary/Chest: Effort normal. No stridor. No respiratory distress. She has wheezes (inspiratory and expiratory throughout). She has no rales.  Lymphadenopathy:    She has no cervical adenopathy.  Skin: Skin is warm and dry. She is not diaphoretic.  Vitals reviewed.       Assessment & Plan:     1. Chronic midline low back pain, with sciatica presence unspecified Refilled x 3 days. Can get normal refill from Dr. Caryn Section on Monday or Tuesday.  - oxyCODONE-acetaminophen (PERCOCET) 10-325 MG tablet; One tablet every 4-6 hours as needed  Dispense: 15 tablet; Refill: 0  2. Cough Refilled as this helps her cough. Also given sample of Stiolto inhaler today as she has not been using this due to not being able to afford. May be warranted to consider theophylline with Dr. Caryn Section for her severe COPD due to cost.  - benzonatate (TESSALON PERLES) 100 MG capsule; Take 1 capsule (100 mg total) by mouth 3  (three) times daily as needed for cough.  Dispense: 30 capsule; Refill: [place], PA-C  [facility]

## 2017-06-02 ENCOUNTER — Telehealth: Payer: Self-pay | Admitting: Family Medicine

## 2017-06-02 ENCOUNTER — Other Ambulatory Visit: Payer: Self-pay | Admitting: *Deleted

## 2017-06-02 ENCOUNTER — Encounter: Payer: Self-pay | Admitting: Family Medicine

## 2017-06-02 ENCOUNTER — Ambulatory Visit (INDEPENDENT_AMBULATORY_CARE_PROVIDER_SITE_OTHER): Payer: Medicare Other | Admitting: Family Medicine

## 2017-06-02 VITALS — BP 112/78 | HR 104 | Temp 97.8°F | Resp 16 | Wt 149.0 lb

## 2017-06-02 DIAGNOSIS — M545 Low back pain, unspecified: Secondary | ICD-10-CM

## 2017-06-02 LAB — POCT URINALYSIS DIPSTICK
BILIRUBIN UA: NEGATIVE
Glucose, UA: NEGATIVE
KETONES UA: NEGATIVE
Leukocytes, UA: NEGATIVE
NITRITE UA: NEGATIVE
PH UA: 6 (ref 5.0–8.0)
PROTEIN UA: NEGATIVE
RBC UA: NEGATIVE
Spec Grav, UA: 1.02 (ref 1.010–1.025)
UROBILINOGEN UA: 0.2 U/dL

## 2017-06-02 MED ORDER — PREDNISONE 10 MG PO TABS: ORAL_TABLET | ORAL | 0 refills | Status: AC

## 2017-06-02 MED ORDER — OXYCODONE-ACETAMINOPHEN 10-325 MG PO TABS: ORAL_TABLET | ORAL | 0 refills | Status: AC

## 2017-06-02 MED ORDER — CYCLOBENZAPRINE HCL 5 MG PO TABS: 5.0000 mg | ORAL_TABLET | Freq: Four times a day (QID) | ORAL | 1 refills | Status: DC | PRN

## 2017-06-02 MED ORDER — MELOXICAM 15 MG PO TABS: 15.0000 mg | ORAL_TABLET | Freq: Every day | ORAL | 2 refills | Status: DC | PRN

## 2017-06-02 NOTE — Telephone Encounter (Signed)
Patient walked in today report to [facility] that she dropped her pill bottle and remaining tablets of oxycdone/apap fell into drain. Insurance will not pay for refill until 06/06/2017. Rx written for 4 days supply today and patient advised she will have to pay out of pocket for medication.

## 2017-06-02 NOTE — Telephone Encounter (Signed)
Please advise pharmacy OK to fill oxycodone/apap today

## 2017-06-02 NOTE — Telephone Encounter (Signed)
Rx request sent to provider

## 2017-06-02 NOTE — Telephone Encounter (Signed)
I called and spoke with pharmacist Nira Conn and advised her as below. Heather wanted to be sure Dr. Caryn Section was ok with doing this. She says the patient has been getting this medication filled early for the past 2 months. She says that this is the last time that she is willing to fill this prescription early.

## 2017-06-02 NOTE — Telephone Encounter (Signed)
Please advise 

## 2017-06-02 NOTE — Progress Notes (Signed)
Patient: Yvette Riley Female    DOB: 1955-12-14   62 y.o.   MRN: [id number] Visit Date: 06/02/2017  Today's Provider: Lelon Huh, MD   Chief Complaint  Patient presents with  . Back Pain   Subjective:    Back Pain  This is a chronic problem. The current episode started more than 1 year ago. The problem has been gradually worsening since onset. The pain is present in the lumbar spine. Quality: sharp-dull pain. Pertinent negatives include no abdominal pain, chest pain, dysuria, fever or weakness. (Dark colored urine)   States that both sides of her back started hurting much worse, feels like a knife in her back for the last two weeks. Has been using heat and ice packs.     Allergies  Allergen Reactions  . Codeine Anaphylaxis  . Crestor [Rosuvastatin] Anaphylaxis  . Other Anaphylaxis  . Penicillins Anaphylaxis, Rash and Other (See Comments)    Reaction:  Tongue swelling  Has patient had a PCN reaction causing immediate rash, facial/tongue/throat swelling, SOB or lightheadedness with hypotension:  Yes   Has patient had a PCN reaction causing severe rash involving mucus membranes or skin necrosis: No Has patient had a PCN reaction that required hospitalization No Has patient had a PCN reaction occurring within the last 10 years: No If all of the above answers are "NO", then may proceed with Cephalosporin use.  . Yellow Jacket Venom [Bee Venom] Anaphylaxis  . Gemfibrozil Rash, Nausea And Vomiting and Swelling  . Statins Nausea And Vomiting and Swelling  . Trazodone And Nefazodone Nausea And Vomiting  . Lipitor [Atorvastatin] Rash     Current Outpatient Medications:  .  albuterol (PROAIR HFA) 108 (90 Base) MCG/ACT inhaler, Inhale 2 puffs into the lungs every 6 (six) hours as needed for wheezing or shortness of breath., Disp: 1 Inhaler, Rfl: 3 .  albuterol (PROVENTIL) (2.5 MG/3ML) 0.083% nebulizer solution, Take 3 mLs (2.5 mg total) by nebulization every 6 (six) hours as  needed for wheezing or shortness of breath., Disp: 75 mL, Rfl: 3 .  aspirin EC 81 MG tablet, Take 81 mg by mouth daily. , Disp: , Rfl:  .  benzonatate (TESSALON PERLES) 100 MG capsule, Take 1 capsule (100 mg total) by mouth 3 (three) times daily as needed for cough., Disp: 30 capsule, Rfl: 1 .  clonazePAM (KLONOPIN) 0.5 MG tablet, TAKE 1 TABLET BY MOUTH TWICE DAILY AS NEEDED, Disp: 60 tablet, Rfl: 3 .  doxycycline (VIBRAMYCIN) 100 MG capsule, Take 1 capsule (100 mg total) by mouth 2 (two) times daily., Disp: 14 capsule, Rfl: 0 .  ezetimibe (ZETIA) 10 MG tablet, Take 1 tablet (10 mg total) by mouth daily., Disp: 30 tablet, Rfl: 3 .  ferrous sulfate 325 (65 FE) MG tablet, Take 1 tablet (325 mg total) by mouth 2 (two) times daily with a meal., Disp: 30 tablet, Rfl: 3 .  glucose blood test strip, Check blood sugar daily. Dx Type 2 diabetes E11.9, Disp: 100 each, Rfl: 3 .  ipratropium-albuterol (DUONEB) 0.5-2.5 (3) MG/3ML SOLN, Inhale 3 mLs into the lungs 4 (four) times daily as needed., Disp: , Rfl:  .  meloxicam (MOBIC) 15 MG tablet, Take 1 tablet (15 mg total) by mouth daily as needed for pain., Disp: 90 tablet, Rfl: 2 .  metFORMIN (GLUCOPHAGE XR) 500 MG 24 hr tablet, Take 2 tablets (1,000 mg total) by mouth daily with breakfast. With meals, Disp: 1 tablet, Rfl: 1 .  mirtazapine (  REMERON) 15 MG tablet, Take 1 tablet (15 mg total) by mouth at bedtime., Disp: 30 tablet, Rfl: 3 .  Nutritional Supplements (GLUCERNA ADVANCE SHAKE) LIQD, Drink 1-2 shakes per day, Disp: 24 Bottle, Rfl: 12 .  ondansetron (ZOFRAN) 4 MG tablet, Take 1 tablet (4 mg total) by mouth every 8 (eight) hours as needed for nausea or vomiting., Disp: 20 tablet, Rfl: 0 .  oxyCODONE-acetaminophen (PERCOCET) 10-325 MG tablet, One tablet every 4-6 hours as needed, Disp: 24 tablet, Rfl: 0 .  oxyCODONE-acetaminophen (PERCOCET) 10-325 MG tablet, One tablet every 4-6 hours as needed, Disp: 15 tablet, Rfl: 0 .  prochlorperazine (COMPAZINE) 10 MG  tablet, Take 1 tablet by mouth every 6 (six) hours as needed., Disp: , Rfl:  .  sucralfate (CARAFATE) 1 g tablet, Take 1 tablet (1 g total) by mouth 4 (four) times daily -  with meals and at bedtime., Disp: 120 tablet, Rfl: 4 .  Tiotropium Bromide-Olodaterol (STIOLTO RESPIMAT) 2.5-2.5 MCG/ACT AERS, Inhale 2 puffs into the lungs daily., Disp: 3 Inhaler, Rfl: 4 .  zaleplon (SONATA) 10 MG capsule, Take 1 capsule (10 mg total) by mouth at bedtime., Disp: 30 capsule, Rfl: 4 .  montelukast (SINGULAIR) 10 MG tablet, Take 1 tablet (10 mg total) by mouth at bedtime. (Patient not taking: Reported on 03/31/2017), Disp: 30 tablet, Rfl: 0  Review of Systems  Constitutional: Negative for appetite change, chills, fatigue and fever.  Respiratory: Negative for chest tightness and shortness of breath.   Cardiovascular: Negative for chest pain and palpitations.  Gastrointestinal: Negative for abdominal pain, nausea and vomiting.  Genitourinary: Negative for dysuria.       Dark colored urine  Musculoskeletal: Positive for back pain.  Neurological: Negative for dizziness and weakness.    Social History   Tobacco Use  . Smoking status: Former Smoker    Packs/day: 0.25    Years: 45.00    Pack years: 11.25    Types: Cigarettes    Last attempt to quit: 03/17/2016    Years since quitting: 1.2  . Smokeless tobacco: Never Used  . Tobacco comment: previously smoked 2 1/2 ppd for 30 years.   Substance Use Topics  . Alcohol use: No    Alcohol/week: 0.0 oz   Objective:   BP 112/78 (BP Location: Left Arm, Patient Position: Sitting, Cuff Size: Normal)   Pulse (!) 104   Temp 97.8 F (36.6 C) (Oral)   Resp 16   Wt 149 lb (67.6 kg)   SpO2 98% Comment: room air  BMI 30.09 kg/m  There were no vitals filed for this visit.   Physical Exam  General appearance: alert, well developed, well nourished, cooperative and in no distress Head: Normocephalic, without obvious abnormality, atraumatic Respiratory:  Respirations even and unlabored, normal respiratory rate Extremities: Tender lower thoracic and mid lumbar spine and paralumbar muscles.      Assessment & Plan:     1. Bilateral low back pain without sciatica, unspecified chronicity  - POCT Urinalysis Dipstick - predniSONE (DELTASONE) 10 MG tablet; 6 tablets for 2 days, then 5 for 2 days, then 4 for 2 days, then 3 for 2 days, then 2 for 2 days, then 1 for 2 days.  Dispense: 42 tablet; Refill: 0 - cyclobenzaprine (FLEXERIL) 5 MG tablet; Take 1-2 tablets (5-10 mg total) by mouth every 6 (six) hours as needed for muscle spasms.  Dispense: 30 tablet; Refill: 1  Did approve filling of 24 additional oxycodone/apap since her insurance will not allow refill  until 06-06-2017. Advised patient that additional tablets and early refill will not be approved as she has run out of medications earlier this month and last month. She states she did take a few extra this month due her back pain being worse and that she spilled a few down the sink on accident.       Lelon Huh, MD  [facility]

## 2017-06-02 NOTE — Telephone Encounter (Signed)
Pt called back saying that she needs a refill on her meloxicam.  Yvette Riley

## 2017-06-02 NOTE — Telephone Encounter (Signed)
Pt called stating that she is at [facility] on [address] trying to get oxyCODONE-acetaminophen (PERCOCET) 10-325 MG tablet that was sent in today. Pt stated that she is going to pay for the medication out of pocket since her insurance won't cover the medication. Pt stated that [facility] advised her she could pay for the medication out of pocket today but they are requiring Dr. Caryn Section or a CMA from our office to contact [facility] to give the verbal Ok to fill the medication today. Please advise. Thanks TNP

## 2017-06-02 NOTE — Telephone Encounter (Signed)
Wells Guiles with [facility] is requesting a call back to discuss pt getting oxyCODONE-acetaminophen (PERCOCET) 10-325 MG tablet early. Wells Guiles stated that pt got a 30 day supply on 04/13/17 and in April requested to get her medication before 30 days, getting the medication on 05/07/17 for a 30 day supply. Wells Guiles stated at that time pt first stated she was needing to get it early due to going out of town then stated that she needed it early because she had dropped her medication. Wells Guiles stated the Rx that was written on 05/31/17 pt didn't get because insurance refused. Insurance is also refusing the Rx that was sent in today with orders of ok to fill today. Wells Guiles stated she is just wanting to discuss this to make sure pt is taking medication correctly. Please advise. Thanks TNP

## 2017-06-02 NOTE — Telephone Encounter (Signed)
Yvette Riley with [facility] not [facility] as previous message states.

## 2017-06-05 ENCOUNTER — Telehealth: Payer: Self-pay

## 2017-06-05 ENCOUNTER — Other Ambulatory Visit: Payer: Self-pay | Admitting: Family Medicine

## 2017-06-05 DIAGNOSIS — M545 Low back pain: Principal | ICD-10-CM

## 2017-06-05 DIAGNOSIS — G8929 Other chronic pain: Secondary | ICD-10-CM

## 2017-06-05 MED ORDER — OXYCODONE-ACETAMINOPHEN 10-325 MG PO TABS: ORAL_TABLET | ORAL | 0 refills | Status: DC

## 2017-06-05 NOTE — Telephone Encounter (Signed)
Please advise 

## 2017-06-05 NOTE — Telephone Encounter (Signed)
Patient called to say that she only got part of the Rx of her Oxycodone the other day.  She said she got 24 and that the pharmacy said she needs to get a new prescription today in order to get any more.

## 2017-06-05 NOTE — Telephone Encounter (Signed)
Patient was notified.

## 2017-06-05 NOTE — Telephone Encounter (Signed)
Is due 5-10. Prescription will be sent first thing tomorrow morning

## 2017-06-09 ENCOUNTER — Ambulatory Visit: Payer: Self-pay | Admitting: Family Medicine

## 2017-06-09 NOTE — Progress Notes (Deleted)
Patient: Yvette Riley Female    DOB: 05/23/1955   62 y.o.   MRN: [id number] Visit Date: 06/09/2017  Today's Provider: Lelon Huh, MD   No chief complaint on file.  Subjective:    HPI   Diabetes Mellitus Type II, Follow-up:   Lab Results  Component Value Date   HGBA1C 6.1 (H) 02/04/2017   HGBA1C 7.1 11/05/2016   HGBA1C 6.4 07/19/2016   Last seen for diabetes 4 months ago.  Management since then includes; labs checked, no changes. She reports {excellent/good/fair/poor:19665} compliance with treatment. She {ACTION; IS/IS IRJ:[id number]} having side effects. *** Current symptoms include {Symptoms; diabetes:14075} and have been {Desc; course:15616}. Home blood sugar records: {diabetes glucometry results:16657}  Episodes of hypoglycemia? {yes***/no:17258}   Current Insulin Regimen: *** Most Recent Eye Exam: *** Weight trend: {trend:16658} Prior visit with dietician: {yes/no:17258} Current diet: {diet habits:16563} Current exercise: {exercise types:16438}  ------------------------------------------------------------------------    Allergies  Allergen Reactions  . Codeine Anaphylaxis  . Crestor [Rosuvastatin] Anaphylaxis  . Other Anaphylaxis  . Penicillins Anaphylaxis, Rash and Other (See Comments)    Reaction:  Tongue swelling  Has patient had a PCN reaction causing immediate rash, facial/tongue/throat swelling, SOB or lightheadedness with hypotension:  Yes   Has patient had a PCN reaction causing severe rash involving mucus membranes or skin necrosis: No Has patient had a PCN reaction that required hospitalization No Has patient had a PCN reaction occurring within the last 10 years: No If all of the above answers are "NO", then may proceed with Cephalosporin use.  . Yellow Jacket Venom [Bee Venom] Anaphylaxis  . Gemfibrozil Rash, Nausea And Vomiting and Swelling  . Statins Nausea And Vomiting and Swelling  . Trazodone And Nefazodone Nausea And Vomiting    . Lipitor [Atorvastatin] Rash     Current Outpatient Medications:  .  albuterol (PROAIR HFA) 108 (90 Base) MCG/ACT inhaler, Inhale 2 puffs into the lungs every 6 (six) hours as needed for wheezing or shortness of breath., Disp: 1 Inhaler, Rfl: 3 .  albuterol (PROVENTIL) (2.5 MG/3ML) 0.083% nebulizer solution, Take 3 mLs (2.5 mg total) by nebulization every 6 (six) hours as needed for wheezing or shortness of breath., Disp: 75 mL, Rfl: 3 .  aspirin EC 81 MG tablet, Take 81 mg by mouth daily. , Disp: , Rfl:  .  benzonatate (TESSALON PERLES) 100 MG capsule, Take 1 capsule (100 mg total) by mouth 3 (three) times daily as needed for cough., Disp: 30 capsule, Rfl: 1 .  clonazePAM (KLONOPIN) 0.5 MG tablet, TAKE 1 TABLET BY MOUTH TWICE DAILY AS NEEDED, Disp: 60 tablet, Rfl: 3 .  cyclobenzaprine (FLEXERIL) 5 MG tablet, Take 1-2 tablets (5-10 mg total) by mouth every 6 (six) hours as needed for muscle spasms., Disp: 30 tablet, Rfl: 1 .  ezetimibe (ZETIA) 10 MG tablet, Take 1 tablet (10 mg total) by mouth daily., Disp: 30 tablet, Rfl: 3 .  ferrous sulfate 325 (65 FE) MG tablet, Take 1 tablet (325 mg total) by mouth 2 (two) times daily with a meal., Disp: 30 tablet, Rfl: 3 .  glucose blood test strip, Check blood sugar daily. Dx Type 2 diabetes E11.9, Disp: 100 each, Rfl: 3 .  ipratropium-albuterol (DUONEB) 0.5-2.5 (3) MG/3ML SOLN, Inhale 3 mLs into the lungs 4 (four) times daily as needed., Disp: , Rfl:  .  meloxicam (MOBIC) 15 MG tablet, Take 1 tablet (15 mg total) by mouth daily as needed for pain., Disp: 90 tablet, Rfl: 2 .  metFORMIN (GLUCOPHAGE XR) 500 MG 24 hr tablet, Take 2 tablets (1,000 mg total) by mouth daily with breakfast. With meals, Disp: 1 tablet, Rfl: 1 .  mirtazapine (REMERON) 15 MG tablet, Take 1 tablet (15 mg total) by mouth at bedtime., Disp: 30 tablet, Rfl: 3 .  montelukast (SINGULAIR) 10 MG tablet, Take 1 tablet (10 mg total) by mouth at bedtime. (Patient not taking: Reported on  03/31/2017), Disp: 30 tablet, Rfl: 0 .  Nutritional Supplements (GLUCERNA ADVANCE SHAKE) LIQD, Drink 1-2 shakes per day, Disp: 24 Bottle, Rfl: 12 .  ondansetron (ZOFRAN) 4 MG tablet, Take 1 tablet (4 mg total) by mouth every 8 (eight) hours as needed for nausea or vomiting., Disp: 20 tablet, Rfl: 0 .  oxyCODONE-acetaminophen (PERCOCET) 10-325 MG tablet, One tablet every 4-6 hours as needed, Disp: 180 tablet, Rfl: 0 .  predniSONE (DELTASONE) 10 MG tablet, 6 tablets for 2 days, then 5 for 2 days, then 4 for 2 days, then 3 for 2 days, then 2 for 2 days, then 1 for 2 days., Disp: 42 tablet, Rfl: 0 .  prochlorperazine (COMPAZINE) 10 MG tablet, Take 1 tablet by mouth every 6 (six) hours as needed., Disp: , Rfl:  .  sucralfate (CARAFATE) 1 g tablet, Take 1 tablet (1 g total) by mouth 4 (four) times daily -  with meals and at bedtime., Disp: 120 tablet, Rfl: 4 .  Tiotropium Bromide-Olodaterol (STIOLTO RESPIMAT) 2.5-2.5 MCG/ACT AERS, Inhale 2 puffs into the lungs daily., Disp: 3 Inhaler, Rfl: 4 .  zaleplon (SONATA) 10 MG capsule, Take 1 capsule (10 mg total) by mouth at bedtime., Disp: 30 capsule, Rfl: 4  Review of Systems  Constitutional: Negative for appetite change, chills, fatigue and fever.  Respiratory: Negative for chest tightness and shortness of breath.   Cardiovascular: Negative for chest pain and palpitations.  Gastrointestinal: Negative for abdominal pain, nausea and vomiting.  Neurological: Negative for dizziness and weakness.    Social History   Tobacco Use  . Smoking status: Former Smoker    Packs/day: 0.25    Years: 45.00    Pack years: 11.25    Types: Cigarettes    Last attempt to quit: 03/17/2016    Years since quitting: 1.2  . Smokeless tobacco: Never Used  . Tobacco comment: previously smoked 2 1/2 ppd for 30 years.   Substance Use Topics  . Alcohol use: No    Alcohol/week: 0.0 oz   Objective:   There were no vitals taken for this visit. There were no vitals filed for this  visit.   Physical Exam      Assessment & Plan:           Lelon Huh, MD  [facility]

## 2017-06-25 ENCOUNTER — Other Ambulatory Visit: Payer: Self-pay | Admitting: Family Medicine

## 2017-06-25 DIAGNOSIS — R05 Cough: Secondary | ICD-10-CM

## 2017-06-25 DIAGNOSIS — R059 Cough, unspecified: Secondary | ICD-10-CM

## 2017-06-25 MED ORDER — BENZONATATE 100 MG PO CAPS: 100.0000 mg | ORAL_CAPSULE | Freq: Three times a day (TID) | ORAL | 5 refills | Status: DC | PRN

## 2017-06-25 NOTE — Telephone Encounter (Signed)
Pt needs refill on her Tesalon pearls.  She has been having a cough lately with the weather...  [address]  Thanks teri

## 2017-06-26 ENCOUNTER — Telehealth: Payer: Self-pay | Admitting: Family Medicine

## 2017-06-26 DIAGNOSIS — M545 Low back pain: Principal | ICD-10-CM

## 2017-06-26 DIAGNOSIS — G8929 Other chronic pain: Secondary | ICD-10-CM

## 2017-06-26 NOTE — Telephone Encounter (Signed)
Patient is requesting a refill on the following medication  oxyCODONE-acetaminophen (PERCOCET) 10-325 MG tablet  She uses [facility].

## 2017-06-29 NOTE — Telephone Encounter (Signed)
Cannot refill until 07-05-2017

## 2017-07-01 NOTE — Progress Notes (Signed)
Patient: Yvette Riley Female    DOB: 1955-10-11   62 y.o.   MRN: [id number] Visit Date: 07/02/2017  Today's Provider: Lelon Huh, MD   Chief Complaint  Patient presents with  . Cough   Subjective:    HPI  Cough  Patient presents today for chronic cough for the last couple o months. Patient states cough is productive of yellow mucous Patient also states she is having shortness of breath and wheezing. She called last week and was prescribed Tessalon Perles which help a little bit. No sinus drainage. Or pressure.   Is using albuterol nebulizer occasionally, has Proair, but doesn't use very often due to cost. Has  Been out of montelukast for several months. Was given samples of Stioloto in the past for COPD which she states works well, but is too expensive so she only uses in intermittently.   She is also here to follow up for cholesterol.  Lipid Panel     Component Value Date/Time   CHOL 266 (H) 02/04/2017 1639   TRIG 331 (H) 02/04/2017 1639   HDL 58 02/04/2017 1639   CHOLHDL 4.6 (H) 02/04/2017 1639   LDLCALC 142 (H) 02/04/2017 1639   Was prescribed ezetimibe in January, but she states she doesn't remember every getting it filled. She is intolerant to multiple statins.     Allergies  Allergen Reactions  . Codeine Anaphylaxis  . Crestor [Rosuvastatin] Anaphylaxis  . Other Anaphylaxis  . Penicillins Anaphylaxis, Rash and Other (See Comments)    Reaction:  Tongue swelling  Has patient had a PCN reaction causing immediate rash, facial/tongue/throat swelling, SOB or lightheadedness with hypotension:  Yes   Has patient had a PCN reaction causing severe rash involving mucus membranes or skin necrosis: No Has patient had a PCN reaction that required hospitalization No Has patient had a PCN reaction occurring within the last 10 years: No If all of the above answers are "NO", then may proceed with Cephalosporin use.  . Yellow Jacket Venom [Bee Venom] Anaphylaxis  .  Gemfibrozil Rash, Nausea And Vomiting and Swelling  . Statins Nausea And Vomiting and Swelling  . Trazodone And Nefazodone Nausea And Vomiting  . Lipitor [Atorvastatin] Rash     Current Outpatient Medications:  .  albuterol (PROAIR HFA) 108 (90 Base) MCG/ACT inhaler, Inhale 2 puffs into the lungs every 6 (six) hours as needed for wheezing or shortness of breath., Disp: 1 Inhaler, Rfl: 3 .  albuterol (PROVENTIL) (2.5 MG/3ML) 0.083% nebulizer solution, Take 3 mLs (2.5 mg total) by nebulization every 6 (six) hours as needed for wheezing or shortness of breath., Disp: 75 mL, Rfl: 3 .  aspirin EC 81 MG tablet, Take 81 mg by mouth daily. , Disp: , Rfl:  .  benzonatate (TESSALON PERLES) 100 MG capsule, Take 1 capsule (100 mg total) by mouth 3 (three) times daily as needed for cough., Disp: 30 capsule, Rfl: 5 .  clonazePAM (KLONOPIN) 0.5 MG tablet, TAKE 1 TABLET BY MOUTH TWICE DAILY AS NEEDED, Disp: 60 tablet, Rfl: 3 .  cyclobenzaprine (FLEXERIL) 5 MG tablet, Take 1-2 tablets (5-10 mg total) by mouth every 6 (six) hours as needed for muscle spasms., Disp: 30 tablet, Rfl: 1 .  ezetimibe (ZETIA) 10 MG tablet, Take 1 tablet (10 mg total) by mouth daily., Disp: 30 tablet, Rfl: 3 .  glucose blood test strip, Check blood sugar daily. Dx Type 2 diabetes E11.9, Disp: 100 each, Rfl: 3 .  ipratropium-albuterol (DUONEB) 0.5-2.5 (  3) MG/3ML SOLN, Inhale 3 mLs into the lungs 4 (four) times daily as needed., Disp: , Rfl:  .  meloxicam (MOBIC) 15 MG tablet, Take 1 tablet (15 mg total) by mouth daily as needed for pain., Disp: 90 tablet, Rfl: 2 .  metFORMIN (GLUCOPHAGE XR) 500 MG 24 hr tablet, Take 2 tablets (1,000 mg total) by mouth daily with breakfast. With meals, Disp: 1 tablet, Rfl: 1 .  Nutritional Supplements (GLUCERNA ADVANCE SHAKE) LIQD, Drink 1-2 shakes per day, Disp: 24 Bottle, Rfl: 12 .  oxyCODONE-acetaminophen (PERCOCET) 10-325 MG tablet, One tablet every 4-6 hours as needed, Disp: 180 tablet, Rfl: 0 .   prochlorperazine (COMPAZINE) 10 MG tablet, Take 1 tablet by mouth every 6 (six) hours as needed., Disp: , Rfl:  .  sucralfate (CARAFATE) 1 g tablet, Take 1 tablet (1 g total) by mouth 4 (four) times daily -  with meals and at bedtime., Disp: 120 tablet, Rfl: 4 .  Tiotropium Bromide-Olodaterol (STIOLTO RESPIMAT) 2.5-2.5 MCG/ACT AERS, Inhale 2 puffs into the lungs daily., Disp: 3 Inhaler, Rfl: 4 .  zaleplon (SONATA) 10 MG capsule, Take 1 capsule (10 mg total) by mouth at bedtime., Disp: 30 capsule, Rfl: 4 .  ferrous sulfate 325 (65 FE) MG tablet, Take 1 tablet (325 mg total) by mouth 2 (two) times daily with a meal. (Patient not taking: Reported on 07/02/2017), Disp: 30 tablet, Rfl: 3 .  mirtazapine (REMERON) 15 MG tablet, Take 1 tablet (15 mg total) by mouth at bedtime. (Patient not taking: Reported on 07/02/2017), Disp: 30 tablet, Rfl: 3 .  montelukast (SINGULAIR) 10 MG tablet, Take 1 tablet (10 mg total) by mouth at bedtime. (Patient not taking: Reported on 03/31/2017), Disp: 30 tablet, Rfl: 0 .  ondansetron (ZOFRAN) 4 MG tablet, Take 1 tablet (4 mg total) by mouth every 8 (eight) hours as needed for nausea or vomiting. (Patient not taking: Reported on 07/02/2017), Disp: 20 tablet, Rfl: 0  Review of Systems  Constitutional: Negative for appetite change, chills, fatigue and fever.  Respiratory: Positive for cough, shortness of breath and wheezing. Negative for chest tightness.   Cardiovascular: Negative for chest pain and palpitations.  Gastrointestinal: Negative for abdominal pain, nausea and vomiting.  Neurological: Negative for dizziness and weakness.    Social History   Tobacco Use  . Smoking status: Former Smoker    Packs/day: 0.25    Years: 45.00    Pack years: 11.25    Types: Cigarettes    Last attempt to quit: 03/17/2016    Years since quitting: 1.2  . Smokeless tobacco: Never Used  . Tobacco comment: previously smoked 2 1/2 ppd for 30 years.   Substance Use Topics  . Alcohol use: No     Alcohol/week: 0.0 oz   Objective:   BP 110/74 (BP Location: Right Arm, Patient Position: Sitting, Cuff Size: Normal)   Pulse 97   Temp 98.4 F (36.9 C) (Oral)   Resp 16   Ht 4\' 10"  (1.473 m)   Wt 153 lb (69.4 kg)   SpO2 97%   BMI 31.98 kg/m  Vitals:   07/02/17 0820  BP: 110/74  Pulse: 97  Resp: 16  Temp: 98.4 F (36.9 C)  TempSrc: Oral  SpO2: 97%  Weight: 153 lb (69.4 kg)  Height: 4\' 10"  (1.473 m)     Physical Exam  General Appearance:    Alert, cooperative, no distress  HENT:   bilateral TM normal without fluid or infection, neck without nodes and post nasal  drip noted  Eyes:    PERRL, conjunctiva/corneas clear, EOM's intact       Lungs:     Occasional expiratory wheeze, no rales, , respirations unlabored  Heart:    Regular rate and rhythm  Neurologic:   Awake, alert, oriented x 3. No apparent focal neurological           defect.           Assessment & Plan:     1. Chronic obstructive pulmonary disease, unspecified COPD type ([place]) Counseled she needs to use maintenance inhaler every day. Given one box of samples, and prescription for - Tiotropium Bromide-Olodaterol (STIOLTO RESPIMAT) 2.5-2.5 MCG/ACT AERS; Inhale 2 puffs into the lungs daily.  Dispense: 3 Inhaler; Refill: 4  - ciprofloxacin (CIPRO) 500 MG tablet; Take 1 tablet (500 mg total) by mouth 2 (two) times daily for 7 days.  Dispense: 14 tablet; Refill: 0  2. Hyperglyceridemia, pure Intolerant to statins, does not appear to have every started- ezetimibe that was prescribed in January, new prescription sent today.   3. Type 2 diabetes mellitus without complication, without long-term current use of insulin (HCC)  - ezetimibe (ZETIA) 10 MG tablet; Take 1 tablet (10 mg total) by mouth daily.  Dispense: 30 tablet; Refill: 4  Return in about 1 month (around 08/01/2017). will reassess COPD, and effectiveness of ezetimibe at follow up.       Lelon Huh, MD  [facility]

## 2017-07-02 ENCOUNTER — Encounter: Payer: Self-pay | Admitting: Family Medicine

## 2017-07-02 ENCOUNTER — Ambulatory Visit (INDEPENDENT_AMBULATORY_CARE_PROVIDER_SITE_OTHER): Payer: Medicare Other | Admitting: Family Medicine

## 2017-07-02 VITALS — BP 110/74 | HR 97 | Temp 98.4°F | Resp 16 | Ht <= 58 in | Wt 153.0 lb

## 2017-07-02 DIAGNOSIS — E119 Type 2 diabetes mellitus without complications: Secondary | ICD-10-CM

## 2017-07-02 DIAGNOSIS — J449 Chronic obstructive pulmonary disease, unspecified: Secondary | ICD-10-CM

## 2017-07-02 DIAGNOSIS — E781 Pure hyperglyceridemia: Secondary | ICD-10-CM | POA: Diagnosis not present

## 2017-07-02 MED ORDER — CIPROFLOXACIN HCL 500 MG PO TABS: 500.0000 mg | ORAL_TABLET | Freq: Two times a day (BID) | ORAL | 0 refills | Status: AC

## 2017-07-02 MED ORDER — EZETIMIBE 10 MG PO TABS: 10.0000 mg | ORAL_TABLET | Freq: Every day | ORAL | 4 refills | Status: DC

## 2017-07-02 MED ORDER — TIOTROPIUM BROMIDE-OLODATEROL 2.5-2.5 MCG/ACT IN AERS: 2.0000 | INHALATION_SPRAY | Freq: Every day | RESPIRATORY_TRACT | 4 refills | Status: DC

## 2017-07-02 NOTE — Patient Instructions (Addendum)
   Start taking 2 puffs of Stiolto every day. Please let me know if you having any trouble filling prescription for Stiolto or ezetimibe (Zetia)   You can take 2 Aleve three times a day as needed to help with pain

## 2017-07-07 ENCOUNTER — Other Ambulatory Visit: Payer: Self-pay | Admitting: *Deleted

## 2017-07-07 DIAGNOSIS — G8929 Other chronic pain: Secondary | ICD-10-CM

## 2017-07-07 DIAGNOSIS — M545 Low back pain: Principal | ICD-10-CM

## 2017-07-07 MED ORDER — OXYCODONE-ACETAMINOPHEN 10-325 MG PO TABS: ORAL_TABLET | ORAL | 0 refills | Status: DC

## 2017-07-11 ENCOUNTER — Other Ambulatory Visit: Payer: Self-pay

## 2017-07-11 MED ORDER — METFORMIN HCL ER 500 MG PO TB24: 1000.0000 mg | ORAL_TABLET | Freq: Every day | ORAL | 3 refills | Status: DC

## 2017-07-22 ENCOUNTER — Inpatient Hospital Stay: Payer: Medicare Other

## 2017-07-22 ENCOUNTER — Ambulatory Visit
Admission: RE | Admit: 2017-07-22 | Discharge: 2017-07-22 | Disposition: A | Discharge status: Home / Self Care | Payer: Medicare Other | Source: Ambulatory Visit | Attending: Oncology | Admitting: Oncology

## 2017-07-22 ENCOUNTER — Telehealth: Payer: Self-pay | Admitting: *Deleted

## 2017-07-22 ENCOUNTER — Inpatient Hospital Stay: Payer: Medicare Other | Attending: Oncology | Admitting: Oncology

## 2017-07-22 VITALS — BP 106/74 | HR 106 | Temp 97.7°F | Resp 22 | Wt 157.2 lb

## 2017-07-22 DIAGNOSIS — R093 Abnormal sputum: Secondary | ICD-10-CM | POA: Insufficient documentation

## 2017-07-22 DIAGNOSIS — R05 Cough: Secondary | ICD-10-CM | POA: Insufficient documentation

## 2017-07-22 DIAGNOSIS — R0609 Other forms of dyspnea: Secondary | ICD-10-CM | POA: Insufficient documentation

## 2017-07-22 DIAGNOSIS — C3491 Malignant neoplasm of unspecified part of right bronchus or lung: Secondary | ICD-10-CM

## 2017-07-22 DIAGNOSIS — I10 Essential (primary) hypertension: Secondary | ICD-10-CM | POA: Diagnosis not present

## 2017-07-22 DIAGNOSIS — R062 Wheezing: Secondary | ICD-10-CM | POA: Diagnosis not present

## 2017-07-22 DIAGNOSIS — Z87891 Personal history of nicotine dependence: Secondary | ICD-10-CM | POA: Diagnosis not present

## 2017-07-22 DIAGNOSIS — J984 Other disorders of lung: Secondary | ICD-10-CM | POA: Insufficient documentation

## 2017-07-22 DIAGNOSIS — R0989 Other specified symptoms and signs involving the circulatory and respiratory systems: Secondary | ICD-10-CM

## 2017-07-22 DIAGNOSIS — R0602 Shortness of breath: Secondary | ICD-10-CM | POA: Diagnosis not present

## 2017-07-22 DIAGNOSIS — E119 Type 2 diabetes mellitus without complications: Secondary | ICD-10-CM

## 2017-07-22 DIAGNOSIS — R059 Cough, unspecified: Secondary | ICD-10-CM

## 2017-07-22 MED ORDER — PREDNISONE 10 MG (21) PO TBPK: ORAL_TABLET | ORAL | 0 refills | Status: DC

## 2017-07-22 MED ORDER — IPRATROPIUM-ALBUTEROL 0.5-2.5 (3) MG/3ML IN SOLN: 3.0000 mL | Freq: Once | RESPIRATORY_TRACT | Status: DC

## 2017-07-22 MED ORDER — IPRATROPIUM-ALBUTEROL 0.5-2.5 (3) MG/3ML IN SOLN
3.0000 mL | Freq: Once | RESPIRATORY_TRACT | Status: AC
  Administered 2017-07-22: 3 mL via RESPIRATORY_TRACT
  Filled 2017-07-22: qty 3

## 2017-07-22 MED ORDER — ALBUTEROL SULFATE (2.5 MG/3ML) 0.083% IN NEBU: 2.5000 mg | INHALATION_SOLUTION | Freq: Four times a day (QID) | RESPIRATORY_TRACT | 3 refills | Status: DC | PRN

## 2017-07-22 NOTE — Progress Notes (Signed)
Symptom Management Consult note [facility]  Telephone:([phone] [phone]  Patient Care Team: Birdie Sons, MD as PCP - General (Family Medicine) Dionisio David, MD (Cardiology)   Name of the patient: Yvette Riley  [id number]  April 21, 1955   Date of visit: 07/22/17  Diagnosis- Clinical stage IIIB small cell lung cancer of the right lung  Chief complaint/ Reason for visit- Shortness of Breath   Heme/Onc history:  Patient was last seen and evaluated by primary medical oncologist Dr. Grayland Ormond on 01/16/2017.  They reviewed recent CT scan results from November 2018 with continued improvement of patient's disease burden.  She was scheduled to return to clinic in 3 months with repeat imaging and further evaluation.  In interim she was seen by Dr. Caryn Section (07/02/17) her PCP where she complained of cough.  She was given a 7-day course of Cipro 500 mg twice daily.  She was counseled on the use of maintenance inhalers daily and given a box of samples of Tiotropiun bromide.   Stated she initially felt better but beginning approximately 1 week ago symptoms returned.  Called PCP and he suggested she see our clinic.   Oncology History   Clinical stage IIIB small cell lung cancer of the right lung: Patient completed concurrent chemotherapy and XRT at outside facility and then transferred care to [facility] to complete treatment and long-term monitoring for recurrence. She completed cycle 6 of carboplatinum and etoposide on August 15, 2016.  She also recently completed PCI.  CT scan results from December 26, 2016 reviewed independently and reported as above with continued improvement of patient's disease burden as well as right hilar mass.        Small cell lung cancer, right ([place])   05/13/2016 Initial Diagnosis    Small cell lung cancer, right (H[facility])        Interval history-  Dyspnea:  [name] reports shortness of breath at rest.   Symptoms  include constant cough, dyspnea on exertion and wheezing.  Symptoms began 3 weeks ago, gradually worsening since that time.   Patient denies post nasal drip or fever. Associated symptoms include dyspnea and productive cough with sputum described as yellow and green.  Patient has not had recent travel.   Weight has been stable.   Symptoms are exacerbated by any exercise.  Symptoms are alleviated by rest and medication(s) (albuterol).  Noted improvement while on Cipro.  Was not given steroids.  ECOG FS:1 - Symptomatic but completely ambulatory  Review of systems- Review of Systems  Constitutional: Positive for malaise/fatigue. Negative for chills, fever and weight loss.  HENT: Negative for congestion and ear pain.   Eyes: Negative.  Negative for blurred vision and double vision.  Respiratory: Positive for cough, sputum production, shortness of breath and wheezing.   Cardiovascular: Negative.  Negative for chest pain, palpitations and leg swelling.  Gastrointestinal: Negative.  Negative for abdominal pain, constipation, diarrhea, nausea and vomiting.  Genitourinary: Negative for dysuria, frequency and urgency.  Musculoskeletal: Negative for back pain and falls.  Skin: Negative.  Negative for rash.  Neurological: Negative.  Negative for weakness and headaches.  Endo/Heme/Allergies: Negative.  Does not bruise/bleed easily.  Psychiatric/Behavioral: Negative.  Negative for depression. The patient is not nervous/anxious and does not have insomnia.      Current treatment- S/p 6 Cycles of Carbo/Etopiside completing in July 2018  Allergies  Allergen Reactions  . Codeine Anaphylaxis  . Crestor [Rosuvastatin] Anaphylaxis  . Other Anaphylaxis  .  Penicillins Anaphylaxis, Rash and Other (See Comments)    Reaction:  Tongue swelling  Has patient had a PCN reaction causing immediate rash, facial/tongue/throat swelling, SOB or lightheadedness with hypotension:  Yes   Has patient had a PCN reaction  causing severe rash involving mucus membranes or skin necrosis: No Has patient had a PCN reaction that required hospitalization No Has patient had a PCN reaction occurring within the last 10 years: No If all of the above answers are "NO", then may proceed with Cephalosporin use.  . Yellow Jacket Venom [Bee Venom] Anaphylaxis  . Gemfibrozil Rash, Nausea And Vomiting and Swelling  . Statins Nausea And Vomiting and Swelling  . Trazodone And Nefazodone Nausea And Vomiting  . Lipitor [Atorvastatin] Rash     Past Medical History:  Diagnosis Date  . Allergy   . Anemia   . Anxiety   . Arthritis   . Asthma   . Blood transfusion without reported diagnosis   . C. difficile diarrhea 04/07/2016  . Cancer ([place])    lung ca  . CAP (community acquired pneumonia) 03/21/2015  . Combined forms of age-related cataract of both eyes 05/06/2016  . COPD (chronic obstructive pulmonary disease) ([place])   . Diabetes mellitus without complication ([place])   . Displacement of lumbar intervertebral disc without myelopathy   . Emphysema of lung ([place])   . GERD (gastroesophageal reflux disease)   . Glaucoma   . History of chicken pox   . Hypertension   . Lung cancer ([place])   . Pneumonia 03/29/2015  . Shortness of breath      Past Surgical History:  Procedure Laterality Date  . CESAREAN SECTION    . TUBAL LIGATION      Social History   Socioeconomic History  . Marital status: Widowed    Spouse name: Not on file  . Number of children: Not on file  . Years of education: Not on file  . Highest education level: Not on file  Occupational History  . Not on file  Social Needs  . Financial resource strain: Not on file  . Food insecurity:    Worry: Not on file    Inability: Not on file  . Transportation needs:    Medical: Not on file    Non-medical: Not on file  Tobacco Use  . Smoking status: Former Smoker    Packs/day: 0.25    Years: 45.00    Pack years: 11.25    Types: Cigarettes    Last attempt to  quit: 03/17/2016    Years since quitting: 1.3  . Smokeless tobacco: Never Used  . Tobacco comment: previously smoked 2 1/2 ppd for 30 years.   Substance and Sexual Activity  . Alcohol use: No    Alcohol/week: 0.0 oz  . Drug use: No  . Sexual activity: Not on file  Lifestyle  . Physical activity:    Days per week: Not on file    Minutes per session: Not on file  . Stress: Not on file  Relationships  . Social connections:    Talks on phone: Not on file    Gets together: Not on file    Attends religious service: Not on file    Active member of club or organization: Not on file    Attends meetings of clubs or organizations: Not on file    Relationship status: Not on file  . Intimate partner violence:    Fear of current or ex partner: Not on file  Emotionally abused: Not on file    Physically abused: Not on file    Forced sexual activity: Not on file  Other Topics Concern  . Not on file  Social History Narrative  . Not on file    Family History  Problem Relation Age of Onset  . Heart failure Mother   . Heart disease Mother   . Stroke Mother   . Heart disease Brother   . COPD Brother   . Cancer Maternal Aunt        Breast Cancer     Current Outpatient Medications:  .  albuterol (PROVENTIL) (2.5 MG/3ML) 0.083% nebulizer solution, Take 3 mLs (2.5 mg total) by nebulization every 6 (six) hours as needed for wheezing or shortness of breath., Disp: 75 mL, Rfl: 3 .  benzonatate (TESSALON PERLES) 100 MG capsule, Take 1 capsule (100 mg total) by mouth 3 (three) times daily as needed for cough., Disp: 30 capsule, Rfl: 5 .  clonazePAM (KLONOPIN) 0.5 MG tablet, TAKE 1 TABLET BY MOUTH TWICE DAILY AS NEEDED, Disp: 60 tablet, Rfl: 3 .  cyclobenzaprine (FLEXERIL) 5 MG tablet, Take 1-2 tablets (5-10 mg total) by mouth every 6 (six) hours as needed for muscle spasms., Disp: 30 tablet, Rfl: 1 .  ezetimibe (ZETIA) 10 MG tablet, Take 1 tablet (10 mg total) by mouth daily., Disp: 30 tablet,  Rfl: 4 .  glucose blood test strip, Check blood sugar daily. Dx Type 2 diabetes E11.9, Disp: 100 each, Rfl: 3 .  ipratropium-albuterol (DUONEB) 0.5-2.5 (3) MG/3ML SOLN, Inhale 3 mLs into the lungs 4 (four) times daily as needed., Disp: , Rfl:  .  meloxicam (MOBIC) 15 MG tablet, Take 1 tablet (15 mg total) by mouth daily as needed for pain., Disp: 90 tablet, Rfl: 2 .  metFORMIN (GLUCOPHAGE XR) 500 MG 24 hr tablet, Take 2 tablets (1,000 mg total) by mouth daily with breakfast. With meals, Disp: 60 tablet, Rfl: 3 .  montelukast (SINGULAIR) 10 MG tablet, Take 1 tablet (10 mg total) by mouth at bedtime., Disp: 30 tablet, Rfl: 0 .  oxyCODONE-acetaminophen (PERCOCET) 10-325 MG tablet, One tablet every 4-6 hours as needed, Disp: 180 tablet, Rfl: 0 .  sucralfate (CARAFATE) 1 g tablet, Take 1 tablet (1 g total) by mouth 4 (four) times daily -  with meals and at bedtime., Disp: 120 tablet, Rfl: 4 .  Tiotropium Bromide-Olodaterol (STIOLTO RESPIMAT) 2.5-2.5 MCG/ACT AERS, Inhale 2 puffs into the lungs daily., Disp: 3 Inhaler, Rfl: 4 .  zaleplon (SONATA) 10 MG capsule, Take 1 capsule (10 mg total) by mouth at bedtime., Disp: 30 capsule, Rfl: 4 .  aspirin EC 81 MG tablet, Take 81 mg by mouth daily. , Disp: , Rfl:  .  Nutritional Supplements (GLUCERNA ADVANCE SHAKE) LIQD, Drink 1-2 shakes per day, Disp: 24 Bottle, Rfl: 12 .  prochlorperazine (COMPAZINE) 10 MG tablet, Take 1 tablet by mouth every 6 (six) hours as needed., Disp: , Rfl:   Current Facility-Administered Medications:  .  ipratropium-albuterol (DUONEB) 0.5-2.5 (3) MG/3ML nebulizer solution 3 mL, 3 mL, Nebulization, Once, Jacquelin Hawking, NP  Physical exam:  Vitals:   07/22/17 1053 07/22/17 1100  BP:  106/74  Pulse:  (!) 106  Resp:  (!) 22  Temp:  97.7 F (36.5 C)  TempSrc:  Tympanic  SpO2: 94% 95%  Weight: 157 lb 3.2 oz (71.3 kg)    Physical Exam  Constitutional: She is oriented to person, place, and time. Vital signs are normal.  HENT:  Head: Normocephalic and atraumatic.  Eyes: Pupils are equal, round, and reactive to light.  Neck: Normal range of motion.  Cardiovascular: Normal rate, regular rhythm and normal heart sounds.  No murmur heard. Pulmonary/Chest: Effort normal. She has wheezes in the right upper field and the right middle field. She has rhonchi in the right upper field, the right lower field, the left upper field and the left lower field.  Abdominal: Soft. Normal appearance and bowel sounds are normal. She exhibits no distension. There is no tenderness.  Musculoskeletal: Normal range of motion. She exhibits no edema.  Neurological: She is alert and oriented to person, place, and time.  Skin: Skin is warm and dry. No rash noted.  Psychiatric: Judgment normal.     CMP Latest Ref Rng & Units 04/02/2017  Glucose 65 - 99 mg/dL 224(H)  BUN 6 - 20 mg/dL 9  Creatinine 0.44 - 1.00 mg/dL 0.61  Sodium 135 - 145 mmol/L 136  Potassium 3.5 - 5.1 mmol/L 3.5  Chloride 101 - 111 mmol/L 100(L)  CO2 22 - 32 mmol/L 21(L)  Calcium 8.9 - 10.3 mg/dL 9.0  Total Protein 6.5 - 8.1 g/dL 8.2(H)  Total Bilirubin 0.3 - 1.2 mg/dL 0.9  Alkaline Phos 38 - 126 U/L 106  AST 15 - 41 U/L 23  ALT 14 - 54 U/L 14   CBC Latest Ref Rng & Units 04/02/2017  WBC 3.6 - 11.0 K/uL 7.8  Hemoglobin 12.0 - 16.0 g/dL 11.3(L)  Hematocrit 35.0 - 47.0 % 34.1(L)  Platelets 150 - 440 K/uL 274    No images are attached to the encounter.  Dg Chest 2 View  Result Date: 07/22/2017 CLINICAL DATA:  Cough.  Shortness of breath. EXAM: CHEST - 2 VIEW COMPARISON:  CT 04/25/2017. Chest x-ray 04/02/2017, 12/04/2017. CT 04/25/2017. FINDINGS: Mediastinum hilar structures are stable with stable pleural-parenchymal thickening on the right. These findings are consistent post treatment at changes inter stable from prior exams. No acute infiltrate noted. Slight nodularity noted the lungs is stable most likely secondary to vessels on end. Stable cardiomegaly. No acute bony  abnormality. IMPRESSION: Stable post treatment changes/pleural-parenchymal scarring right perihilar region. No acute cardiopulmonary disease. Chest is stable from prior exam. Electronically Signed   By: Marcello Moores  Register   On: 07/22/2017 12:40     Assessment and plan- Patient is a 62 y.o. female who presents for continued shortness of breath.   1.  Lung cancer: s/p 6 cycles of carbo/etoposide last given in July 2018.  Last seen and evaluated by Dr. Grayland Ormond in December 2018 where they reviewed recent CT scan imaging revealing decrease in burden of disease.  He did not feel any further interventions were needed at this time.  She was scheduled to return to clinic in 3 months with repeat CT scan and further evaluation.  Patient had a CT scan did not follow-up with Dr. Grayland Ormond at that time.  Most recent CT scan (march 2019) revealed stable right perihilar lung mass in an similar appearance of masslike architectural distortion within the medial right lower lung.  She did not have scheduled follow-up with Dr. Grayland Ormond at this time.  I will get this  rescheduled for her.  2.  Shortness of breath/chest congestion: Patient recently on 7-day course of ciprofloxacin by primary care physician.  Stated she had improving symptoms but they have returned.  Will send for stat chest x-ray.  On assessment patient with obvious rhonchi and wheezing.  She would benefit from an albuterol treatment here  in clinic today.  May need extended course of antibiotics and steroids.  Chest X-ray: No acute findings.  She does not need additional antibiotics at this time.  She has been afebrile.  This is likely a COPD exacerbation or chronic bronchitis.  We will try a prednisone taper.  Patient is a diabetic so she will need to monitor her blood sugars. RX Prednisone Taper X 6 Days.  Refill duo neb nebulizer.   Patient needs to be referred back to pulmonology. She would like to be seen by [facility]. Will get this arranged.      Visit Diagnosis 1. Small cell lung cancer, right ([place])   2. Shortness of breath     Patient expressed understanding and was in agreement with this plan. She also understands that She can call clinic at any time with any questions, concerns, or complaints.   Greater than 50% was spent in counseling and coordination of care with this patient including but not limited to discussion of the relevant topics above (See A&P) including, but not limited to diagnosis and management of acute and chronic medical conditions.    Faythe Casa, AGNP-C [facility]- [phone] Pager- [phone] 07/22/2017 1:07 PM

## 2017-07-22 NOTE — Telephone Encounter (Signed)
This patient No Showed for her appointment in March with Dr Grayland Ormond. He is not available this week, but Symptom Management Clinic can see her today. She will come in at 11 AM and see Lorretta Harp, NP. Dr Grayland Ormond wants sto see her in the next 2 weeks

## 2017-07-22 NOTE — Telephone Encounter (Signed)
Patient reports left message with answering service over weekend but has not heard back. She reports not feeling well, increased shortness of breath, and really feels she needs to see Dr. Grayland Ormond for someone from symptom management clinic. Please contact.

## 2017-07-23 ENCOUNTER — Inpatient Hospital Stay: Payer: Medicare Other

## 2017-07-23 ENCOUNTER — Inpatient Hospital Stay: Payer: Medicare Other | Admitting: Oncology

## 2017-07-25 NOTE — Progress Notes (Deleted)
[facility] Consultation      Assessment and Plan:  Mediastinal and hilar lymphadenopathy.  --Discussed need for EBUS biopsy,  Her breathing is now better and her COPD is now better controlled.  --Will scheduled for EBUS.   COPD --Anoro puff per day. Continue xopenex. --Pt will continue to follow up with Dr. Raul Del as her regular pulmonologist.   Nicotine abuse.  --She has stopped smoking and is congratulated. She is urged not to restart.     Date: 07/25/2017  MRN# [id number] Yvette Riley 1955-05-26  Referring Physician: Dr. Grayland Ormond.   Yvette Riley is a 62 y.o. old female seen in consultation for chief complaint of:    No chief complaint on file.   HPI:  The patient is a 62 year old female who normally sees Dr. Raul Del, she has a history of COPD.  She was last seen in March 2018, at that time she had been referred by Dr. Raul Del due to abnormal screening CT scan which showed mediastinal and hilar lymphadenopathy.  She was scheduled for an EBUS bronchoscopy but then canceled, as she moved from [place] to [place].  She was admitted to [facility] in March 2018, there was noted that she had the lung mass as well as a pleural effusion.  She underwent a right thoracentesis on 04/09/2016 with drainage of about 400 cc.   She is using oxygen 2L at night.   Sat at [facility]elin 04/04/16; @ RA at rest is 97% and HR 94; Ambulated slowly with a cane. After 180 feet sat was 92%, HR 110.   **CT lung cancer screening on 02/27/16>> Review of these films shows a midline precarinal lymph node, as well as nodes in the 11R, 7 stations.  Review of tracings, PLT 03/29/16: FEV is 63% of predicted, there is no CVA reversibility with bronchodilator. FVC is 62% of predicted. Ratio is 74%. RV to TLC ratio is elevated, TLC is 84%. DLCO 79%. Overall this test is consistent with mild restrictive lung disease, there is slight air trapping which may be indicative of mild  COPD.   Medication:   Reviewed.     Allergies:  Codeine; Crestor [rosuvastatin]; Other; Penicillins; Yellow jacket venom [bee venom]; Gemfibrozil; Statins; Trazodone and nefazodone; and Lipitor [atorvastatin]  Review of Systems: Gen:  Denies  fever, sweats, chills HEENT: Denies blurred vision, double vision. bleeds, sore throat Cvc:  No dizziness, chest pain. Resp:   Denies cough or sputum production, shortness of breath Gi: Denies swallowing difficulty, stomach pain. Gu:  Denies bladder incontinence, burning urine Ext:   No Joint pain, stiffness. Skin: No skin rash,  hives  Endoc:  No polyuria, polydipsia. Psych: No depression, insomnia. Other:  All other systems were reviewed with the patient and were negative other that what is mentioned in the HPI.   Physical Examination:   VS: There were no vitals taken for this visit.  General Appearance: No distress  Neuro:without focal findings,  speech normal,  HEENT: PERRLA, EOM intact.   Pulmonary: decreased air entry bilaterally.  CardiovascularNormal S1,S2.  No m/r/g.   Abdomen: Benign, Soft, non-tender. Renal:  No costovertebral tenderness  GU:  No performed at this time. Endoc: No evident thyromegaly, no signs of acromegaly. Skin:   warm, no rashes, no ecchymosis  Extremities: normal, no cyanosis, clubbing.  Other findings:    LABORATORY PANEL:   CBC No results for input(s): WBC, HGB, HCT, PLT in the last 168 hours. ------------------------------------------------------------------------------------------------------------------  Chemistries  No results for input(s):  NA, K, CL, CO2, GLUCOSE, BUN, CREATININE, CALCIUM, MG, AST, ALT, ALKPHOS, BILITOT in the last 168 hours.  Invalid input(s): GFRCGP ------------------------------------------------------------------------------------------------------------------  Cardiac Enzymes No results for input(s): TROPONINI in the last 168  hours. ------------------------------------------------------------  RADIOLOGY:  No results found.     Thank  you for the consultation and for allowing [facility] to assist in the care of your patient. Our recommendations are noted above.  Please contact us if we can be of further service.   Marda Stalker, MD.  Board Certified in Internal Medicine, Pulmonary Medicine, [place], and Sleep Medicine.  [facility] Office Number: [phone]  Patricia Pesa, M.D.  Vilinda Boehringer, M.D.  Merton Border, M.D  07/25/2017

## 2017-07-28 ENCOUNTER — Ambulatory Visit: Payer: Medicare Other | Admitting: Internal Medicine

## 2017-07-29 NOTE — Progress Notes (Signed)
Yvette Riley  [phone] [phone]) [phone]  ID: ELOWYN RAUPP OB: 08-09-1955  MR#: [id number]  HKU#:[id number]  Patient Care Team: Birdie Sons, MD as PCP - General (Family Medicine) Dionisio David, MD (Cardiology)  CHIEF COMPLAINT: Clinical stage IIIB small cell lung cancer of the right lung.  INTERVAL HISTORY: Patient last evaluated in clinic on January 16, 2017.  She was seen in symptom management clinic last week for increasing shortness of breath and cough.  These symptoms are still evident, but patient did admit that have improved.  She also continues to be anxious.  She has no neurologic complaints.  She denies any fevers.  She has a good appetite and denies weight loss.  She denies any chest pain or hemoptysis.  She denies any nausea, vomiting, constipation, or diarrhea. She has no urinary complaints.  Patient offers no further specific complaints today.  REVIEW OF SYSTEMS:   Review of Systems  Constitutional: Negative.  Negative for fever, malaise/fatigue and weight loss.  Respiratory: Positive for cough and shortness of breath. Negative for hemoptysis.   Cardiovascular: Negative.  Negative for chest pain and leg swelling.  Gastrointestinal: Negative.  Negative for abdominal pain and constipation.  Genitourinary: Negative.  Negative for dysuria.  Musculoskeletal: Negative.  Negative for back pain and myalgias.  Skin: Negative.  Negative for rash.  Neurological: Negative.  Negative for sensory change, focal weakness, weakness and headaches.  Psychiatric/Behavioral: The patient is nervous/anxious.     As per HPI. Otherwise, a complete review of systems is negative.  PAST MEDICAL HISTORY: Past Medical History:  Diagnosis Date  . Allergy   . Anemia   . Anxiety   . Arthritis   . Asthma   . Blood transfusion without reported diagnosis   . C. difficile diarrhea 04/07/2016  . Cancer ([place])    lung ca  . CAP (community acquired  pneumonia) 03/21/2015  . Combined forms of age-related cataract of both eyes 05/06/2016  . COPD (chronic obstructive pulmonary disease) ([place])   . Diabetes mellitus without complication ([place])   . Displacement of lumbar intervertebral disc without myelopathy   . Emphysema of lung ([place])   . GERD (gastroesophageal reflux disease)   . Glaucoma   . History of chicken pox   . Hypertension   . Lung cancer ([place])   . Pneumonia 03/29/2015  . Shortness of breath     PAST SURGICAL HISTORY: Past Surgical History:  Procedure Laterality Date  . CESAREAN SECTION    . TUBAL LIGATION      FAMILY HISTORY: Family History  Problem Relation Age of Onset  . Heart failure Mother   . Heart disease Mother   . Stroke Mother   . Heart disease Brother   . COPD Brother   . Cancer Maternal Aunt        Breast Cancer    ADVANCED DIRECTIVES (Y/N):  N  HEALTH MAINTENANCE: Social History   Tobacco Use  . Smoking status: Former Smoker    Packs/day: 0.25    Years: 45.00    Pack years: 11.25    Types: Cigarettes    Last attempt to quit: 03/17/2016    Years since quitting: 1.3  . Smokeless tobacco: Never Used  . Tobacco comment: previously smoked 2 1/2 ppd for 30 years.   Substance Use Topics  . Alcohol use: No    Alcohol/week: 0.0 oz  . Drug use: No     Colonoscopy:  PAP:  Bone density:  Lipid  panel:  Allergies  Allergen Reactions  . Codeine Anaphylaxis  . Crestor [Rosuvastatin] Anaphylaxis  . Other Anaphylaxis  . Penicillins Anaphylaxis, Rash and Other (See Comments)    Reaction:  Tongue swelling  Has patient had a PCN reaction causing immediate rash, facial/tongue/throat swelling, SOB or lightheadedness with hypotension:  Yes   Has patient had a PCN reaction causing severe rash involving mucus membranes or skin necrosis: No Has patient had a PCN reaction that required hospitalization No Has patient had a PCN reaction occurring within the last 10 years: No If all of the above answers are  "NO", then may proceed with Cephalosporin use.  . Yellow Jacket Venom [Bee Venom] Anaphylaxis  . Gemfibrozil Rash, Nausea And Vomiting and Swelling  . Statins Nausea And Vomiting and Swelling  . Trazodone And Nefazodone Nausea And Vomiting  . Lipitor [Atorvastatin] Rash    Current Outpatient Medications  Medication Sig Dispense Refill  . aspirin EC 81 MG tablet Take 81 mg by mouth daily.     . benzonatate (TESSALON PERLES) 100 MG capsule Take 1 capsule (100 mg total) by mouth 3 (three) times daily as needed for cough. 30 capsule 5  . ezetimibe (ZETIA) 10 MG tablet Take 1 tablet (10 mg total) by mouth daily. 30 tablet 4  . glucose blood test strip Check blood sugar daily. Dx Type 2 diabetes E11.9 100 each 3  . meloxicam (MOBIC) 15 MG tablet Take 1 tablet (15 mg total) by mouth daily as needed for pain. 90 tablet 2  . metFORMIN (GLUCOPHAGE XR) 500 MG 24 hr tablet Take 2 tablets (1,000 mg total) by mouth daily with breakfast. With meals 60 tablet 3  . oxyCODONE-acetaminophen (PERCOCET) 10-325 MG tablet One tablet every 4-6 hours as needed 180 tablet 0  . predniSONE (STERAPRED UNI-PAK 21 TAB) 10 MG (21) TBPK tablet Take as directed. 21 tablet 0  . PROAIR HFA 108 (90 Base) MCG/ACT inhaler INHALE 2 PUFFS BY MOUTH EVERY 6 HOURS AS NEEDED FOR WHEEZING OR SHORTNESS OF BREATH  3  . sucralfate (CARAFATE) 1 g tablet Take 1 tablet (1 g total) by mouth 4 (four) times daily -  with meals and at bedtime. 120 tablet 4  . Tiotropium Bromide-Olodaterol (STIOLTO RESPIMAT) 2.5-2.5 MCG/ACT AERS Inhale 2 puffs into the lungs daily. 3 Inhaler 4  . zaleplon (SONATA) 10 MG capsule Take 1 capsule (10 mg total) by mouth at bedtime. 30 capsule 4  . albuterol (PROVENTIL) (2.5 MG/3ML) 0.083% nebulizer solution Take 3 mLs (2.5 mg total) by nebulization every 6 (six) hours as needed for wheezing or shortness of breath. (Patient not taking: Reported on 07/30/2017) 75 mL 3  . clonazePAM (KLONOPIN) 0.5 MG tablet TAKE 1 TABLET BY  MOUTH TWICE DAILY AS NEEDED (Patient not taking: Reported on 07/30/2017) 60 tablet 3  . cyclobenzaprine (FLEXERIL) 5 MG tablet Take 1-2 tablets (5-10 mg total) by mouth every 6 (six) hours as needed for muscle spasms. (Patient not taking: Reported on 07/30/2017) 30 tablet 1  . ipratropium-albuterol (DUONEB) 0.5-2.5 (3) MG/3ML SOLN Inhale 3 mLs into the lungs 4 (four) times daily as needed.    . montelukast (SINGULAIR) 10 MG tablet Take 1 tablet (10 mg total) by mouth at bedtime. 30 tablet 0  . NARCAN 4 MG/0.1ML LIQD nasal spray kit ADMINISTER A SINGLE SPRAY IN ONE NOSTRIL UPON SIGNS OF OPIOID OVERDOSE. CALL 911. REPEAT AFTER 3 MINUTES IF NO RESPONSE.  99  . Nutritional Supplements (GLUCERNA ADVANCE SHAKE) LIQD Drink 1-2 shakes  per day (Patient not taking: Reported on 07/30/2017) 24 Bottle 12  . prochlorperazine (COMPAZINE) 10 MG tablet Take 1 tablet by mouth every 6 (six) hours as needed.     No current facility-administered medications for this visit.     OBJECTIVE: Vitals:   07/30/17 1154  BP: 122/82  Pulse: (!) 114  Resp: 20  Temp: (!) 97.4 F (36.3 C)     Body mass index is 33.31 kg/m.    ECOG FS:0 - Asymptomatic  General: Well-developed, well-nourished, no acute distress. Eyes: Pink conjunctiva, anicteric sclera. Lungs: Clear to auscultation bilaterally. Heart: Regular rate and rhythm. No rubs, murmurs, or gallops. Abdomen: Soft, nontender, nondistended. No organomegaly noted, normoactive bowel sounds. Musculoskeletal: No edema, cyanosis, or clubbing. Neuro: Alert, answering all questions appropriately. Cranial nerves grossly intact. Skin: No rashes or petechiae noted. Psych: Normal affect.   LAB RESULTS:  Lab Results  Component Value Date   NA 138 07/30/2017   K 3.7 07/30/2017   CL 106 07/30/2017   CO2 23 07/30/2017   GLUCOSE 117 (H) 07/30/2017   BUN 13 07/30/2017   CREATININE 0.67 07/30/2017   CALCIUM 9.3 07/30/2017   PROT 7.6 07/30/2017   ALBUMIN 4.3 07/30/2017   AST  23 07/30/2017   ALT 16 07/30/2017   ALKPHOS 70 07/30/2017   BILITOT 1.3 (H) 07/30/2017   GFRNONAA >60 07/30/2017   GFRAA >60 07/30/2017    Lab Results  Component Value Date   WBC 8.7 07/30/2017   NEUTROABS 7.0 (H) 07/30/2017   HGB 12.5 07/30/2017   HCT 36.4 07/30/2017   MCV 90.0 07/30/2017   PLT 260 07/30/2017     STUDIES: Dg Chest 2 View  Result Date: 07/22/2017 CLINICAL DATA:  Cough.  Shortness of breath. EXAM: CHEST - 2 VIEW COMPARISON:  CT 04/25/2017. Chest x-ray 04/02/2017, 12/04/2017. CT 04/25/2017. FINDINGS: Mediastinum hilar structures are stable with stable pleural-parenchymal thickening on the right. These findings are consistent post treatment at changes inter stable from prior exams. No acute infiltrate noted. Slight nodularity noted the lungs is stable most likely secondary to vessels on end. Stable cardiomegaly. No acute bony abnormality. IMPRESSION: Stable post treatment changes/pleural-parenchymal scarring right perihilar region. No acute cardiopulmonary disease. Chest is stable from prior exam. Electronically Signed   By: Marcello Moores  Register   On: 07/22/2017 12:40    ASSESSMENT: Clinical stage IIIB small cell lung cancer of the right lung.  PLAN:    1. Clinical stage IIIB small cell lung cancer of the right lung: Patient completed concurrent chemotherapy and XRT at outside facility and then transferred care to [facility] to complete treatment and long-term monitoring for recurrence. She completed cycle 6 of carboplatinum and etoposide on August 15, 2016.  She also recently completed PCI.  Her most recent CT scans on September 25, 2017 reviewed independently revealed no evidence of recurrent or progressive disease.  She continues to have a right hilar mass that is stable.  Patient states she is in the midst of moving, therefore we will repeat imaging in 4 weeks with follow-up 1 to 2 days later.  If there is continued improvement of her imaging, can consider changing imaging to every 6  months. 2.  Cough/shortness of breath: Patient recently completed antibiotics.  Continue symptomatic treatment as prescribed. 3.  Elevated bilirubin: Unclear etiology, mild.  Monitor. 4.  Anemia: Resolved.  Patient expressed understanding and was in agreement with this plan. She also understands that She can call clinic at any time with any questions, concerns,  or complaints.   Cancer Staging Small cell lung cancer, right [facility]) Staging form: Lung, AJCC 8th Edition - Clinical stage from 08/18/2016: Stage IIIB (cT2a, cN3, cM0) - Signed by Lloyd Huger, MD on 08/18/2016   Lloyd Huger, MD   08/01/2017 7:27 AM

## 2017-07-30 ENCOUNTER — Other Ambulatory Visit: Payer: Self-pay

## 2017-07-30 ENCOUNTER — Inpatient Hospital Stay (HOSPITAL_BASED_OUTPATIENT_CLINIC_OR_DEPARTMENT_OTHER): Payer: Medicare Other | Admitting: Oncology

## 2017-07-30 ENCOUNTER — Inpatient Hospital Stay: Payer: Medicare Other | Attending: Oncology

## 2017-07-30 VITALS — BP 122/82 | HR 114 | Temp 97.4°F | Resp 20 | Wt 159.4 lb

## 2017-07-30 DIAGNOSIS — Z923 Personal history of irradiation: Secondary | ICD-10-CM | POA: Diagnosis not present

## 2017-07-30 DIAGNOSIS — R0602 Shortness of breath: Secondary | ICD-10-CM | POA: Insufficient documentation

## 2017-07-30 DIAGNOSIS — Z9221 Personal history of antineoplastic chemotherapy: Secondary | ICD-10-CM | POA: Insufficient documentation

## 2017-07-30 DIAGNOSIS — R05 Cough: Secondary | ICD-10-CM | POA: Insufficient documentation

## 2017-07-30 DIAGNOSIS — Z87891 Personal history of nicotine dependence: Secondary | ICD-10-CM | POA: Diagnosis not present

## 2017-07-30 DIAGNOSIS — C3491 Malignant neoplasm of unspecified part of right bronchus or lung: Secondary | ICD-10-CM | POA: Diagnosis not present

## 2017-07-30 DIAGNOSIS — Z5189 Encounter for other specified aftercare: Secondary | ICD-10-CM

## 2017-07-30 LAB — COMPREHENSIVE METABOLIC PANEL
ALK PHOS: 70 U/L (ref 38–126)
ALT: 16 U/L (ref 0–44)
AST: 23 U/L (ref 15–41)
Albumin: 4.3 g/dL (ref 3.5–5.0)
Anion gap: 9 (ref 5–15)
BILIRUBIN TOTAL: 1.3 mg/dL — AB (ref 0.3–1.2)
BUN: 13 mg/dL (ref 6–20)
CALCIUM: 9.3 mg/dL (ref 8.9–10.3)
CO2: 23 mmol/L (ref 22–32)
Chloride: 106 mmol/L (ref 98–111)
Creatinine, Ser: 0.67 mg/dL (ref 0.44–1.00)
GLUCOSE: 117 mg/dL — AB (ref 70–99)
Potassium: 3.7 mmol/L (ref 3.5–5.1)
Sodium: 138 mmol/L (ref 135–145)
TOTAL PROTEIN: 7.6 g/dL (ref 6.5–8.1)

## 2017-07-30 LAB — CBC WITH DIFFERENTIAL/PLATELET
BASOS ABS: 0 10*3/uL (ref 0–0.1)
BASOS PCT: 0 %
Eosinophils Absolute: 0.2 10*3/uL (ref 0–0.7)
Eosinophils Relative: 2 %
HEMATOCRIT: 36.4 % (ref 35.0–47.0)
Hemoglobin: 12.5 g/dL (ref 12.0–16.0)
Lymphocytes Relative: 11 %
Lymphs Abs: 0.9 10*3/uL — ABNORMAL LOW (ref 1.0–3.6)
MCH: 30.9 pg (ref 26.0–34.0)
MCHC: 34.3 g/dL (ref 32.0–36.0)
MCV: 90 fL (ref 80.0–100.0)
MONO ABS: 0.5 10*3/uL (ref 0.2–0.9)
Monocytes Relative: 6 %
Neutro Abs: 7 10*3/uL — ABNORMAL HIGH (ref 1.4–6.5)
Neutrophils Relative %: 81 %
PLATELETS: 260 10*3/uL (ref 150–440)
RBC: 4.04 MIL/uL (ref 3.80–5.20)
RDW: 14 % (ref 11.5–14.5)
WBC: 8.7 10*3/uL (ref 3.6–11.0)

## 2017-07-30 NOTE — Progress Notes (Signed)
Here for follow up doing " ok overall" feeling weak she stated.

## 2017-08-01 ENCOUNTER — Ambulatory Visit: Payer: Self-pay | Admitting: Family Medicine

## 2017-08-01 ENCOUNTER — Other Ambulatory Visit: Payer: Self-pay

## 2017-08-01 NOTE — Telephone Encounter (Signed)
Pharmacy send refill request.

## 2017-08-02 MED ORDER — MONTELUKAST SODIUM 10 MG PO TABS: 10.0000 mg | ORAL_TABLET | Freq: Every day | ORAL | 6 refills | Status: DC

## 2017-08-04 ENCOUNTER — Encounter: Payer: Self-pay | Admitting: Internal Medicine

## 2017-08-05 ENCOUNTER — Other Ambulatory Visit: Payer: Self-pay | Admitting: Family Medicine

## 2017-08-05 DIAGNOSIS — M545 Low back pain: Principal | ICD-10-CM

## 2017-08-05 DIAGNOSIS — G8929 Other chronic pain: Secondary | ICD-10-CM

## 2017-08-05 MED ORDER — OXYCODONE-ACETAMINOPHEN 10-325 MG PO TABS: ORAL_TABLET | ORAL | 0 refills | Status: DC

## 2017-08-05 NOTE — Telephone Encounter (Signed)
Pt needs refill on her oxycodone 10-325  [facility] [address]  Thanks teri

## 2017-08-08 ENCOUNTER — Encounter: Payer: Self-pay | Admitting: Family Medicine

## 2017-08-08 ENCOUNTER — Ambulatory Visit (INDEPENDENT_AMBULATORY_CARE_PROVIDER_SITE_OTHER): Payer: Medicare Other | Admitting: Family Medicine

## 2017-08-08 VITALS — BP 102/78 | HR 116 | Temp 98.6°F | Resp 16 | Wt 157.0 lb

## 2017-08-08 DIAGNOSIS — J449 Chronic obstructive pulmonary disease, unspecified: Secondary | ICD-10-CM

## 2017-08-08 DIAGNOSIS — E781 Pure hyperglyceridemia: Secondary | ICD-10-CM

## 2017-08-08 DIAGNOSIS — G4700 Insomnia, unspecified: Secondary | ICD-10-CM | POA: Diagnosis not present

## 2017-08-08 DIAGNOSIS — E119 Type 2 diabetes mellitus without complications: Secondary | ICD-10-CM | POA: Diagnosis not present

## 2017-08-08 LAB — POCT GLYCOSYLATED HEMOGLOBIN (HGB A1C)
ESTIMATED AVERAGE GLUCOSE: 140
Hemoglobin A1C: 6.5 % — AB (ref 4.0–5.6)

## 2017-08-08 MED ORDER — GLUCOSE BLOOD VI STRP: ORAL_STRIP | 4 refills | Status: DC

## 2017-08-08 MED ORDER — ZALEPLON 10 MG PO CAPS: ORAL_CAPSULE | ORAL | 3 refills | Status: DC

## 2017-08-08 MED ORDER — TIOTROPIUM BROMIDE-OLODATEROL 2.5-2.5 MCG/ACT IN AERS: 2.0000 | INHALATION_SPRAY | Freq: Every day | RESPIRATORY_TRACT | 12 refills | Status: DC

## 2017-08-08 NOTE — Progress Notes (Signed)
Patient: Yvette Riley Female    DOB: September 09, 1955   62 y.o.   MRN: [id number] Visit Date: 08/08/2017  Today's Provider: Lelon Huh, MD   Chief Complaint  Patient presents with  . Hyperlipidemia  . Diabetes  . COPD   Subjective:    HPI  Lipid/Cholesterol, Follow-up:   Last seen for this1 months ago.  Management changes since that visit include giving patient a new prescription for Zetia. Patient had never started this prescription that was previously sent into her pharmacy. . Last Lipid Panel:    Component Value Date/Time   CHOL 266 (H) 02/04/2017 1639   TRIG 331 (H) 02/04/2017 1639   HDL 58 02/04/2017 1639   CHOLHDL 4.6 (H) 02/04/2017 1639   LDLCALC 142 (H) 02/04/2017 1639    Risk factors for vascular disease include diabetes mellitus and hypercholesterolemia  She reports fair compliance with treatment. She is not having side effects.  Current symptoms include none and have been stable. Weight trend: stable Prior visit with dietician: no Current diet: regular diet Current exercise: walking  Wt Readings from Last 3 Encounters:  08/08/17 157 lb (71.2 kg)  07/30/17 159 lb 6.4 oz (72.3 kg)  07/22/17 157 lb 3.2 oz (71.3 kg)    -------------------------------------------------------------------  Diabetes Mellitus Type II, Follow-up:   Lab Results  Component Value Date   HGBA1C 6.1 (H) 02/04/2017   HGBA1C 7.1 11/05/2016   HGBA1C 6.4 07/19/2016    Last seen for diabetes 1 months ago.  Management since then includes no changes. She reports fair compliance with treatment. She is not having side effects.  Current symptoms include none and have been stable. Home blood sugar records: blood sugars are checked at home and average in the 120's  Episodes of hypoglycemia? no   Current Insulin Regimen: none Most Recent Eye Exam: >1 year ago Weight trend: stable Prior visit with dietician: no Current diet: regular diet Current exercise:  walking  Pertinent Labs:    Component Value Date/Time   CHOL 266 (H) 02/04/2017 1639   TRIG 331 (H) 02/04/2017 1639   HDL 58 02/04/2017 1639   LDLCALC 142 (H) 02/04/2017 1639   CREATININE 0.67 07/30/2017 1106   CREATININE 0.68 02/02/2012 1114    Wt Readings from Last 3 Encounters:  08/08/17 157 lb (71.2 kg)  07/30/17 159 lb 6.4 oz (72.3 kg)  07/22/17 157 lb 3.2 oz (71.3 kg)    ------------------------------------------------------------------------ Follow up of COPD: Patient was last seen for this problem 1 month ago. Management during that visit includes counseling patient to use inhalers every day. Patient was given samples and a prescription for Stiloto Respimat. She was also given a prescription for Cipro. Today patient comes in reporting that she has run out of the inhaler several days ago. She was unable to fill the prescription due to the cost. Patient reports this problem is stable although she still has some cough.     Allergies  Allergen Reactions  . Codeine Anaphylaxis  . Crestor [Rosuvastatin] Anaphylaxis  . Other Anaphylaxis  . Penicillins Anaphylaxis, Rash and Other (See Comments)    Reaction:  Tongue swelling  Has patient had a PCN reaction causing immediate rash, facial/tongue/throat swelling, SOB or lightheadedness with hypotension:  Yes   Has patient had a PCN reaction causing severe rash involving mucus membranes or skin necrosis: No Has patient had a PCN reaction that required hospitalization No Has patient had a PCN reaction occurring within the last 10 years:  No If all of the above answers are "NO", then may proceed with Cephalosporin use.  . Yellow Jacket Venom [Bee Venom] Anaphylaxis  . Gemfibrozil Rash, Nausea And Vomiting and Swelling  . Statins Nausea And Vomiting and Swelling  . Trazodone And Nefazodone Nausea And Vomiting  . Lipitor [Atorvastatin] Rash      Current Outpatient Medications:  .  albuterol (PROVENTIL) (2.5 MG/3ML) 0.083%  nebulizer solution, Take 3 mLs (2.5 mg total) by nebulization every 6 (six) hours as needed for wheezing or shortness of breath., Disp: 75 mL, Rfl: 3 .  aspirin EC 81 MG tablet, Take 81 mg by mouth daily. , Disp: , Rfl:  .  benzonatate (TESSALON PERLES) 100 MG capsule, Take 1 capsule (100 mg total) by mouth 3 (three) times daily as needed for cough., Disp: 30 capsule, Rfl: 5 .  clonazePAM (KLONOPIN) 0.5 MG tablet, TAKE 1 TABLET BY MOUTH TWICE DAILY AS NEEDED, Disp: 60 tablet, Rfl: 3 .  ezetimibe (ZETIA) 10 MG tablet, Take 1 tablet (10 mg total) by mouth daily., Disp: 30 tablet, Rfl: 4 .  glucose blood test strip, Check blood sugar daily. Dx Type 2 diabetes E11.9, Disp: 100 each, Rfl: 3 .  ipratropium-albuterol (DUONEB) 0.5-2.5 (3) MG/3ML SOLN, Inhale 3 mLs into the lungs 4 (four) times daily as needed., Disp: , Rfl:  .  meloxicam (MOBIC) 15 MG tablet, Take 1 tablet (15 mg total) by mouth daily as needed for pain., Disp: 90 tablet, Rfl: 2 .  metFORMIN (GLUCOPHAGE XR) 500 MG 24 hr tablet, Take 2 tablets (1,000 mg total) by mouth daily with breakfast. With meals, Disp: 60 tablet, Rfl: 3 .  montelukast (SINGULAIR) 10 MG tablet, Take 1 tablet (10 mg total) by mouth at bedtime., Disp: 30 tablet, Rfl: 6 .  NARCAN 4 MG/0.1ML LIQD nasal spray kit, ADMINISTER A SINGLE SPRAY IN ONE NOSTRIL UPON SIGNS OF OPIOID OVERDOSE. CALL 911. REPEAT AFTER 3 MINUTES IF NO RESPONSE., Disp: , Rfl: 99 .  oxyCODONE-acetaminophen (PERCOCET) 10-325 MG tablet, One tablet every 4-6 hours as needed, Disp: 180 tablet, Rfl: 0 .  predniSONE (STERAPRED UNI-PAK 21 TAB) 10 MG (21) TBPK tablet, Take as directed., Disp: 21 tablet, Rfl: 0 .  PROAIR HFA 108 (90 Base) MCG/ACT inhaler, INHALE 2 PUFFS BY MOUTH EVERY 6 HOURS AS NEEDED FOR WHEEZING OR SHORTNESS OF BREATH, Disp: , Rfl: 3 .  sucralfate (CARAFATE) 1 g tablet, Take 1 tablet (1 g total) by mouth 4 (four) times daily -  with meals and at bedtime., Disp: 120 tablet, Rfl: 4 .  zaleplon  (SONATA) 10 MG capsule, Take 1 capsule (10 mg total) by mouth at bedtime., Disp: 30 capsule, Rfl: 4 .  cyclobenzaprine (FLEXERIL) 5 MG tablet, Take 1-2 tablets (5-10 mg total) by mouth every 6 (six) hours as needed for muscle spasms. (Patient not taking: Reported on 07/30/2017), Disp: 30 tablet, Rfl: 1 .  Nutritional Supplements (GLUCERNA ADVANCE SHAKE) LIQD, Drink 1-2 shakes per day (Patient not taking: Reported on 07/30/2017), Disp: 24 Bottle, Rfl: 12 .  prochlorperazine (COMPAZINE) 10 MG tablet, Take 1 tablet by mouth every 6 (six) hours as needed., Disp: , Rfl:  .  Tiotropium Bromide-Olodaterol (STIOLTO RESPIMAT) 2.5-2.5 MCG/ACT AERS, Inhale 2 puffs into the lungs daily. (Patient not taking: Reported on 08/08/2017), Disp: 3 Inhaler, Rfl: 4  Review of Systems  Constitutional: Negative for appetite change, chills, fatigue and fever.  Respiratory: Positive for cough and shortness of breath. Negative for chest tightness.   Cardiovascular: Negative  for chest pain and palpitations.  Gastrointestinal: Negative for abdominal pain, nausea and vomiting.  Neurological: Negative for dizziness and weakness.  Psychiatric/Behavioral: Positive for sleep disturbance (trouble staying asleep).    Social History   Tobacco Use  . Smoking status: Former Smoker    Packs/day: 0.25    Years: 45.00    Pack years: 11.25    Types: Cigarettes    Last attempt to quit: 03/17/2016    Years since quitting: 1.3  . Smokeless tobacco: Never Used  . Tobacco comment: previously smoked 2 1/2 ppd for 30 years.   Substance Use Topics  . Alcohol use: No    Alcohol/week: 0.0 oz   Objective:   BP 102/78 (BP Location: Left Arm, Patient Position: Sitting, Cuff Size: Normal)   Pulse (!) 116   Temp 98.6 F (37 C) (Oral)   Resp 16   Wt 157 lb (71.2 kg)   SpO2 95% Comment: room air  BMI 32.81 kg/m  Vitals:   08/08/17 1532  BP: 102/78  Pulse: (!) 116  Resp: 16  Temp: 98.6 F (37 C)  TempSrc: Oral  SpO2: 95%  Weight:  157 lb (71.2 kg)     Physical Exam   General Appearance:    Alert, cooperative, no distress  Eyes:    PERRL, conjunctiva/corneas clear, EOM's intact       Lungs:     Clear to auscultation bilaterally, respirations unlabored  Heart:    Regular rate and rhythm  Neurologic:   Awake, alert, oriented x 3. No apparent focal neurological           defect.       A1c=6.5%    Assessment & Plan:     .1. Type 2 diabetes mellitus without complication, without long-term current use of insulin (HCC) Doing well current regiment.  - POCT HgB A1C  2. Chronic obstructive pulmonary disease, unspecified COPD type (HCC)  - Tiotropium Bromide-Olodaterol (STIOLTO RESPIMAT) 2.5-2.5 MCG/ACT AERS; Inhale 2 puffs into the lungs daily.  Dispense: 1 Inhaler; Refill: 12  3. Hyperglyceridemia, pure Intolerant to statins but compliant of tolerating ezetimibe.  - Lipid panel  4. Insomnia, unspecified type Try sonata          Lelon Huh, MD  [facility]

## 2017-08-13 ENCOUNTER — Other Ambulatory Visit: Payer: Self-pay | Admitting: Family Medicine

## 2017-08-13 MED ORDER — GLUCOSE BLOOD VI STRP: ORAL_STRIP | 4 refills | Status: DC

## 2017-08-19 ENCOUNTER — Telehealth: Payer: Self-pay | Admitting: Family Medicine

## 2017-08-19 ENCOUNTER — Telehealth: Payer: Self-pay

## 2017-08-19 MED ORDER — ZALEPLON 10 MG PO CAPS: ORAL_CAPSULE | ORAL | 3 refills | Status: DC

## 2017-08-19 NOTE — Telephone Encounter (Signed)
See other phone note

## 2017-08-19 NOTE — Telephone Encounter (Signed)
Patient is requesting zaleplon (SONATA) 10 MG capsule be sent to [facility] in [place]. Patient states the medication was sent to [facility], but they do not have the medication in stock. CB# [phone]

## 2017-08-19 NOTE — Telephone Encounter (Signed)
Ebony Hail at [facility] called saying they do not have the Sonata 10mg  in stock that was prescribed for Yvette Riley  She wants to know if you will send the prescription to [facility] in [place],  She checked with them and they have it.  Thanks [employer]

## 2017-08-19 NOTE — Telephone Encounter (Signed)
Please advise 

## 2017-08-20 DIAGNOSIS — E781 Pure hyperglyceridemia: Secondary | ICD-10-CM | POA: Diagnosis not present

## 2017-08-21 LAB — LIPID PANEL
Chol/HDL Ratio: 4 ratio (ref 0.0–4.4)
Cholesterol, Total: 210 mg/dL — ABNORMAL HIGH (ref 100–199)
HDL: 52 mg/dL (ref >39)
LDL CALC: 109 mg/dL — AB (ref 0–99)
TRIGLYCERIDES: 244 mg/dL — AB (ref 0–149)
VLDL CHOLESTEROL CAL: 49 mg/dL — AB (ref 5–40)

## 2017-08-27 ENCOUNTER — Ambulatory Visit
Admission: RE | Admit: 2017-08-27 | Discharge: 2017-08-27 | Disposition: A | Discharge status: Home / Self Care | Payer: Medicare Other | Source: Ambulatory Visit | Attending: Oncology | Admitting: Oncology

## 2017-08-27 DIAGNOSIS — K229 Disease of esophagus, unspecified: Secondary | ICD-10-CM | POA: Insufficient documentation

## 2017-08-27 DIAGNOSIS — R918 Other nonspecific abnormal finding of lung field: Secondary | ICD-10-CM | POA: Diagnosis not present

## 2017-08-27 DIAGNOSIS — C3491 Malignant neoplasm of unspecified part of right bronchus or lung: Secondary | ICD-10-CM | POA: Diagnosis not present

## 2017-08-27 DIAGNOSIS — Z923 Personal history of irradiation: Secondary | ICD-10-CM | POA: Insufficient documentation

## 2017-08-27 HISTORY — DX: Malignant neoplasm of unspecified part of unspecified bronchus or lung: C34.90

## 2017-08-27 LAB — POCT I-STAT CREATININE: Creatinine, Ser: 0.7 mg/dL (ref 0.44–1.00)

## 2017-08-27 MED ORDER — IOHEXOL 300 MG/ML  SOLN
75.0000 mL | Freq: Once | INTRAMUSCULAR | Status: AC | PRN
  Administered 2017-08-27: 75 mL via INTRAVENOUS

## 2017-09-01 NOTE — Progress Notes (Signed)
[place]  [phone] [phone]  ID: Yvette Riley OB: Jan 21, 1956  MR#: [id number]  WGY#:[id number]  Patient Care Team: Birdie Sons, MD as PCP - General (Family Medicine) Dionisio David, MD (Cardiology)  CHIEF COMPLAINT: Clinical stage IIIB small cell lung cancer of the right lung.  INTERVAL HISTORY: Patient returns to clinic today for further evaluation and discussion of her imaging results.  She has chronic weakness and fatigue.  She also complains of chronic back pain.  She otherwise feels well.  She has no neurologic complaints.  She denies any recent fevers or illnesses.  She has a good appetite and denies weight loss.  She denies any chest pain, cough, shortness of breath, or hemoptysis.  She denies any nausea, vomiting, constipation, or diarrhea. She has no urinary complaints.  Patient offers no further specific complaints today.  REVIEW OF SYSTEMS:   Review of Systems  Constitutional: Positive for malaise/fatigue. Negative for fever and weight loss.  Respiratory: Negative.  Negative for cough, hemoptysis and shortness of breath.   Cardiovascular: Negative.  Negative for chest pain and leg swelling.  Gastrointestinal: Negative.  Negative for abdominal pain and constipation.  Genitourinary: Negative.  Negative for dysuria.  Musculoskeletal: Positive for back pain. Negative for myalgias.  Skin: Negative.  Negative for rash.  Neurological: Positive for weakness. Negative for sensory change, focal weakness and headaches.  Psychiatric/Behavioral: The patient is nervous/anxious.     As per HPI. Otherwise, a complete review of systems is negative.  PAST MEDICAL HISTORY: Past Medical History:  Diagnosis Date  . Allergy   . Anemia   . Anxiety   . Arthritis   . Asthma   . Blood transfusion without reported diagnosis   . C. difficile diarrhea 04/07/2016  . CAP (community acquired pneumonia) 03/21/2015  . Combined forms of  age-related cataract of both eyes 05/06/2016  . COPD (chronic obstructive pulmonary disease) ([place])   . Diabetes mellitus without complication ([place])   . Displacement of lumbar intervertebral disc without myelopathy   . Emphysema of lung ([place])   . GERD (gastroesophageal reflux disease)   . Glaucoma   . History of chicken pox   . Hypertension   . Pneumonia 03/29/2015  . Shortness of breath   . Small cell lung cancer ([place])     PAST SURGICAL HISTORY: Past Surgical History:  Procedure Laterality Date  . CESAREAN SECTION    . TUBAL LIGATION      FAMILY HISTORY: Family History  Problem Relation Age of Onset  . Heart failure Mother   . Heart disease Mother   . Stroke Mother   . Heart disease Brother   . COPD Brother   . Cancer Maternal Aunt        Breast Cancer    ADVANCED DIRECTIVES (Y/N):  N  HEALTH MAINTENANCE: Social History   Tobacco Use  . Smoking status: Former Smoker    Packs/day: 0.25    Years: 45.00    Pack years: 11.25    Types: Cigarettes    Last attempt to quit: 03/17/2016    Years since quitting: 1.4  . Smokeless tobacco: Never Used  . Tobacco comment: previously smoked 2 1/2 ppd for 30 years.   Substance Use Topics  . Alcohol use: No    Alcohol/week: 0.0 standard drinks  . Drug use: No     Colonoscopy:  PAP:  Bone density:  Lipid panel:  Allergies  Allergen Reactions  . Codeine Anaphylaxis  .  Crestor [Rosuvastatin] Anaphylaxis  . Other Anaphylaxis  . Penicillins Anaphylaxis, Rash and Other (See Comments)    Reaction:  Tongue swelling  Has patient had a PCN reaction causing immediate rash, facial/tongue/throat swelling, SOB or lightheadedness with hypotension:  Yes   Has patient had a PCN reaction causing severe rash involving mucus membranes or skin necrosis: No Has patient had a PCN reaction that required hospitalization No Has patient had a PCN reaction occurring within the last 10 years: No If all of the above answers are "NO", then may  proceed with Cephalosporin use.  . [place] Venom [Bee Venom] Anaphylaxis  . Gemfibrozil Rash, Nausea And Vomiting and Swelling  . Statins Nausea And Vomiting and Swelling  . Trazodone And Nefazodone Nausea And Vomiting  . Lipitor [Atorvastatin] Rash    Current Outpatient Medications  Medication Sig Dispense Refill  . albuterol (PROVENTIL) (2.5 MG/3ML) 0.083% nebulizer solution Take 3 mLs (2.5 mg total) by nebulization every 6 (six) hours as needed for wheezing or shortness of breath. 75 mL 3  . aspirin EC 81 MG tablet Take 81 mg by mouth daily.     . benzonatate (TESSALON PERLES) 100 MG capsule Take 1 capsule (100 mg total) by mouth 3 (three) times daily as needed for cough. 30 capsule 5  . clonazePAM (KLONOPIN) 0.5 MG tablet TAKE 1 TABLET BY MOUTH TWICE DAILY AS NEEDED 60 tablet 3  . ezetimibe (ZETIA) 10 MG tablet Take 1 tablet (10 mg total) by mouth daily. 30 tablet 4  . glucose blood test strip Contour test strip. Use to check blood sugar once a day for for type 2 diabetes (E11.9) 100 each 4  . ipratropium-albuterol (DUONEB) 0.5-2.5 (3) MG/3ML SOLN Inhale 3 mLs into the lungs 4 (four) times daily as needed.    . meloxicam (MOBIC) 15 MG tablet Take 1 tablet (15 mg total) by mouth daily as needed for pain. 90 tablet 2  . metFORMIN (GLUCOPHAGE XR) 500 MG 24 hr tablet Take 2 tablets (1,000 mg total) by mouth daily with breakfast. With meals 60 tablet 3  . NARCAN 4 MG/0.1ML LIQD nasal spray kit ADMINISTER A SINGLE SPRAY IN ONE NOSTRIL UPON SIGNS OF OPIOID OVERDOSE. CALL 911. REPEAT AFTER 3 MINUTES IF NO RESPONSE.  99  . oxyCODONE-acetaminophen (PERCOCET) 10-325 MG tablet One tablet every 4-6 hours as needed 180 tablet 0  . PROAIR HFA 108 (90 Base) MCG/ACT inhaler INHALE 2 PUFFS BY MOUTH EVERY 6 HOURS AS NEEDED FOR WHEEZING OR SHORTNESS OF BREATH  3  . prochlorperazine (COMPAZINE) 10 MG tablet Take 1 tablet by mouth every 6 (six) hours as needed.    . Tiotropium Bromide-Olodaterol (STIOLTO  RESPIMAT) 2.5-2.5 MCG/ACT AERS Inhale 2 puffs into the lungs daily. 1 Inhaler 12  . zaleplon (SONATA) 10 MG capsule Take one every evening, may take second tablet after two hours if needed 60 capsule 3  . montelukast (SINGULAIR) 10 MG tablet Take 1 tablet (10 mg total) by mouth at bedtime. 30 tablet 6  . Nutritional Supplements (GLUCERNA ADVANCE SHAKE) LIQD Drink 1-2 shakes per day (Patient not taking: Reported on 07/30/2017) 24 Bottle 12  . sucralfate (CARAFATE) 1 g tablet Take 1 tablet (1 g total) by mouth 4 (four) times daily -  with meals and at bedtime. (Patient not taking: Reported on 09/04/2017) 120 tablet 4   No current facility-administered medications for this visit.     OBJECTIVE: Vitals:   09/04/17 0919  BP: 132/84  Pulse: 72  Resp:  18  Temp: 97.6 F (36.4 C)     Body mass index is 34.73 kg/m.    ECOG FS:0 - Asymptomatic  General: Well-developed, well-nourished, no acute distress. Eyes: Pink conjunctiva, anicteric sclera. Lungs: Clear to auscultation bilaterally. Heart: Regular rate and rhythm. No rubs, murmurs, or gallops. Abdomen: Soft, nontender, nondistended. No organomegaly noted, normoactive bowel sounds. Musculoskeletal: No edema, cyanosis, or clubbing. Neuro: Alert, answering all questions appropriately. Cranial nerves grossly intact. Skin: No rashes or petechiae noted. Psych: Normal affect.   LAB RESULTS:  Lab Results  Component Value Date   NA 138 07/30/2017   K 3.7 07/30/2017   CL 106 07/30/2017   CO2 23 07/30/2017   GLUCOSE 117 (H) 07/30/2017   BUN 13 07/30/2017   CREATININE 0.70 08/27/2017   CALCIUM 9.3 07/30/2017   PROT 7.6 07/30/2017   ALBUMIN 4.3 07/30/2017   AST 23 07/30/2017   ALT 16 07/30/2017   ALKPHOS 70 07/30/2017   BILITOT 1.3 (H) 07/30/2017   GFRNONAA >60 07/30/2017   GFRAA >60 07/30/2017    Lab Results  Component Value Date   WBC 8.7 07/30/2017   NEUTROABS 7.0 (H) 07/30/2017   HGB 12.5 07/30/2017   HCT 36.4 07/30/2017   MCV  90.0 07/30/2017   PLT 260 07/30/2017     STUDIES: Ct Chest W Contrast  Result Date: 08/27/2017 CLINICAL DATA:  Right lung small cell lung cancer diagnosed in 2017 post chemotherapy and radiation therapy. EXAM: CT CHEST WITH CONTRAST TECHNIQUE: Multidetector CT imaging of the chest was performed during intravenous contrast administration. CONTRAST:  57m OMNIPAQUE IOHEXOL 300 MG/ML  SOLN COMPARISON:  CT 04/25/2017 and 12/26/2016.  PET-CT 09/24/2016. FINDINGS: Cardiovascular: Mild coronary artery atherosclerosis again noted. No acute vascular findings demonstrated. There is a stable small pericardial effusion. The heart size is normal. Mediastinum/Nodes: There are no enlarged discrete mediastinal, hilar or axillary lymph nodes. There is stable soft tissue thickening around the right hilum with associated volume loss. There is stable diffuse esophageal wall thickening. The thyroid gland and trachea appear normal. Lungs/Pleura: There is trace loculated pleural fluid posteriorly on the right. No significant left pleural effusion or pneumothorax. Mild underlying centrilobular emphysema is present. There are stable radiation changes in the right perihilar region with associated volume loss and bronchiectasis. There is no discrete measurable right hilar mass. Patchy peribronchovascular tree-in-bud opacities are again noted in the right lower lobe, overall improved from the prior study. There is minimal involvement medially in the left lower lobe. The left lung is otherwise clear. Upper abdomen: The visualized upper abdomen appears stable without suspicious findings. Musculoskeletal/Chest wall: There is no chest wall mass or suspicious osseous finding. IMPRESSION: 1. Stable right perihilar radiation changes with volume loss and confluent soft tissue density. No discrete mass or adenopathy currently demonstrated. 2. The patchy peribronchovascular tree-in-bud opacities in the right lung seen on the prior study have  overall improved, although there are persistent components in the right lower lobe and new components medially in the left lower lobe. Again, these are likely inflammatory/infectious. 3. Stable wall thickening of the esophagus, attributed to prior radiation therapy. Electronically Signed   By: WRichardean SaleM.D.   On: 08/27/2017 14:24    ASSESSMENT: Clinical stage IIIB small cell lung cancer of the right lung.  PLAN:    1. Clinical stage IIIB small cell lung cancer of the right lung: Patient completed concurrent chemotherapy and XRT at outside facility and [facility] and long-term monitoring  for recurrence. She completed cycle 6 of carboplatinum and etoposide on August 15, 2016.  She also completed PCI.  Most recent CT scan on August 27, 2017 revealed no evidence of recurrent or progressive disease.  Return to clinic in 6 months with repeat imaging and further evaluation.   2.  Cough/shortness of breath: Patient does not complain of this today.  Monitor.  3.  Back pain: Chronic, continue symptomatic treatment.  Recommended patient follow-up with PCP.  Patient expressed understanding and was in agreement with this plan. She also understands that She can call clinic at any time with any questions, concerns, or complaints.   Cancer Staging Small cell lung cancer, right [facility]) Staging form: Lung, AJCC 8th Edition - Clinical stage from 08/18/2016: Stage IIIB (cT2a, cN3, cM0) - Signed by Lloyd Huger, MD on 08/18/2016   Lloyd Huger, MD   09/07/2017 8:11 AM

## 2017-09-03 ENCOUNTER — Other Ambulatory Visit: Payer: Self-pay | Admitting: Family Medicine

## 2017-09-03 DIAGNOSIS — M545 Low back pain: Principal | ICD-10-CM

## 2017-09-03 DIAGNOSIS — G8929 Other chronic pain: Secondary | ICD-10-CM

## 2017-09-03 MED ORDER — OXYCODONE-ACETAMINOPHEN 10-325 MG PO TABS: ORAL_TABLET | ORAL | 0 refills | Status: DC

## 2017-09-03 NOTE — Telephone Encounter (Signed)
Needs refill on her   Oxycodone 10-325  [facility] [address]  Thanks  [employer]

## 2017-09-04 ENCOUNTER — Other Ambulatory Visit: Payer: Self-pay

## 2017-09-04 ENCOUNTER — Encounter: Payer: Self-pay | Admitting: Oncology

## 2017-09-04 ENCOUNTER — Inpatient Hospital Stay: Payer: Medicare Other | Attending: Oncology | Admitting: Oncology

## 2017-09-04 VITALS — BP 132/84 | HR 72 | Temp 97.6°F | Resp 18 | Wt 166.2 lb

## 2017-09-04 DIAGNOSIS — Z87891 Personal history of nicotine dependence: Secondary | ICD-10-CM | POA: Diagnosis not present

## 2017-09-04 DIAGNOSIS — M545 Low back pain: Principal | ICD-10-CM

## 2017-09-04 DIAGNOSIS — Z853 Personal history of malignant neoplasm of breast: Secondary | ICD-10-CM | POA: Diagnosis not present

## 2017-09-04 DIAGNOSIS — Z9221 Personal history of antineoplastic chemotherapy: Secondary | ICD-10-CM | POA: Insufficient documentation

## 2017-09-04 DIAGNOSIS — M549 Dorsalgia, unspecified: Secondary | ICD-10-CM

## 2017-09-04 DIAGNOSIS — Z923 Personal history of irradiation: Secondary | ICD-10-CM | POA: Insufficient documentation

## 2017-09-04 DIAGNOSIS — G8929 Other chronic pain: Secondary | ICD-10-CM

## 2017-09-04 DIAGNOSIS — C3491 Malignant neoplasm of unspecified part of right bronchus or lung: Secondary | ICD-10-CM

## 2017-09-04 NOTE — Progress Notes (Signed)
Pt reports still has a persistent cough. Reports feeling "fatigued and tired more".  Pt reports having pain in lower back.

## 2017-09-04 NOTE — Telephone Encounter (Signed)
Pt is requesting the "round tablets" not the "Oblong"

## 2017-09-05 NOTE — Telephone Encounter (Signed)
This was just refilled on 09-03-2017

## 2017-10-01 ENCOUNTER — Encounter: Payer: Self-pay | Admitting: Radiation Oncology

## 2017-10-01 ENCOUNTER — Other Ambulatory Visit: Payer: Self-pay

## 2017-10-01 ENCOUNTER — Other Ambulatory Visit: Payer: Self-pay | Admitting: Family Medicine

## 2017-10-01 ENCOUNTER — Ambulatory Visit
Admission: RE | Admit: 2017-10-01 | Discharge: 2017-10-01 | Disposition: A | Discharge status: Home / Self Care | Payer: Medicare Other | Source: Ambulatory Visit | Attending: Radiation Oncology | Admitting: Radiation Oncology

## 2017-10-01 VITALS — BP 153/87 | HR 96 | Temp 98.1°F | Resp 18 | Wt 168.7 lb

## 2017-10-01 DIAGNOSIS — Z9221 Personal history of antineoplastic chemotherapy: Secondary | ICD-10-CM | POA: Diagnosis not present

## 2017-10-01 DIAGNOSIS — Z87891 Personal history of nicotine dependence: Secondary | ICD-10-CM | POA: Diagnosis not present

## 2017-10-01 DIAGNOSIS — Z923 Personal history of irradiation: Secondary | ICD-10-CM | POA: Diagnosis not present

## 2017-10-01 DIAGNOSIS — Z85118 Personal history of other malignant neoplasm of bronchus and lung: Secondary | ICD-10-CM | POA: Diagnosis not present

## 2017-10-01 DIAGNOSIS — C3491 Malignant neoplasm of unspecified part of right bronchus or lung: Secondary | ICD-10-CM

## 2017-10-01 DIAGNOSIS — G8929 Other chronic pain: Secondary | ICD-10-CM

## 2017-10-01 DIAGNOSIS — M545 Low back pain: Principal | ICD-10-CM

## 2017-10-01 NOTE — Progress Notes (Signed)
Radiation Oncology Follow up Note  Name: Yvette Riley   Date:   10/01/2017 MRN:  [id number] DOB: Mar 16, 1955    This 62 y.o. female presents to the clinic today for 1 year follow-up status post concurrent chemoradiation therapy as well as PCI for limited stage small cell lung cancer.  REFERRING PROVIDER: Birdie Sons, MD  HPI: patient is a 62 year old female now out over a year having completed concurrent chemoradiation plus PCI for limited stage small cell lung cancer. She is seen today in routine follow-up is doing well. She still has a slight productive cough no hemoptysis. No change in her pulmonary status. No change in her neurologic assessment. She had a recent CT scan of the chest.which I have reviewed showing stable right perihilar radiation changes with some improvement in patchy parabronchial vascular opacities in the right lung.  COMPLICATIONS OF TREATMENT: none  FOLLOW UP COMPLIANCE: keeps appointments   PHYSICAL EXAM:  BP (!) 153/87 (BP Location: Left Arm, Patient Position: Sitting)   Pulse 96   Temp 98.1 F (36.7 C) (Tympanic)   Resp 18   Wt 168 lb 10.4 oz (76.5 kg)   BMI 35.25 kg/m  Well-developed well-nourished patient in NAD. HEENT reveals PERLA, EOMI, discs not visualized.  Oral cavity is clear. No oral mucosal lesions are identified. Neck is clear without evidence of cervical or supraclavicular adenopathy. Lungs are clear to A&P. Cardiac examination is essentially unremarkable with regular rate and rhythm without murmur rub or thrill. Abdomen is benign with no organomegaly or masses noted. Motor sensory and DTR levels are equal and symmetric in the upper and lower extremities. Cranial nerves II through XII are grossly intact. Proprioception is intact. No peripheral adenopathy or edema is identified. No motor or sensory levels are noted. Crude visual fields are within normal range.  RADIOLOGY RESULTS: CT scan of chest is reviewed and compatible with the above-stated  findings  PLAN: the present time patient is doing well with no evidence of disease. I am please were overall progress. I have asked to see her back in 1 year for follow-up. Patient continues close follow-up care with medical oncology. Patient is to call with any concerns at any time.  I would like to take this opportunity to thank you for allowing me to participate in the care of your patient.Noreene Filbert, MD
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
## 2017-10-01 NOTE — Telephone Encounter (Signed)
Please advise 

## 2017-10-01 NOTE — Telephone Encounter (Signed)
Pt called back to verify refill request message was sent to Dr. Caryn Section. Pt was advised that I have sent the message to Dr. Caryn Section and his CMAs. Please advise. Thanks TNP
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
## 2017-10-02 MED ORDER — OXYCODONE-ACETAMINOPHEN 10-325 MG PO TABS: ORAL_TABLET | ORAL | 0 refills | Status: DC

## 2017-10-02 MED ORDER — ZALEPLON 10 MG PO CAPS: ORAL_CAPSULE | ORAL | 3 refills | Status: DC

## 2017-10-02 MED ORDER — LANCETS MISC: 4 refills | Status: DC

## 2017-10-02 NOTE — Addendum Note (Signed)
Addended by: Birdie Sons on: 10/02/2017 10:58 AM   Modules accepted: Orders

## 2017-10-02 NOTE — Addendum Note (Signed)
Addended by: Julieta Bellini on: 10/02/2017 10:37 AM   Modules accepted: Orders

## 2017-10-02 NOTE — Telephone Encounter (Signed)
Patient calling back regarding the update on her medications and she is also requesting a refill for the following medication zaleplon (SONATA) 10 MG capsule

## 2017-10-03 ENCOUNTER — Ambulatory Visit (INDEPENDENT_AMBULATORY_CARE_PROVIDER_SITE_OTHER): Payer: Medicare Other | Admitting: Family Medicine

## 2017-10-03 ENCOUNTER — Telehealth: Payer: Self-pay | Admitting: Family Medicine

## 2017-10-03 ENCOUNTER — Encounter: Payer: Self-pay | Admitting: Family Medicine

## 2017-10-03 VITALS — BP 132/83 | HR 81 | Temp 99.0°F | Resp 16 | Wt 167.0 lb

## 2017-10-03 DIAGNOSIS — F419 Anxiety disorder, unspecified: Secondary | ICD-10-CM

## 2017-10-03 DIAGNOSIS — R059 Cough, unspecified: Secondary | ICD-10-CM

## 2017-10-03 DIAGNOSIS — J449 Chronic obstructive pulmonary disease, unspecified: Secondary | ICD-10-CM

## 2017-10-03 DIAGNOSIS — G4700 Insomnia, unspecified: Secondary | ICD-10-CM

## 2017-10-03 DIAGNOSIS — J40 Bronchitis, not specified as acute or chronic: Secondary | ICD-10-CM

## 2017-10-03 DIAGNOSIS — R05 Cough: Secondary | ICD-10-CM | POA: Diagnosis not present

## 2017-10-03 MED ORDER — MONTELUKAST SODIUM 10 MG PO TABS: 10.0000 mg | ORAL_TABLET | Freq: Every day | ORAL | 3 refills | Status: DC

## 2017-10-03 MED ORDER — CLONAZEPAM 0.5 MG PO TABS: 0.5000 mg | ORAL_TABLET | Freq: Two times a day (BID) | ORAL | 1 refills | Status: DC | PRN

## 2017-10-03 MED ORDER — BENZONATATE 100 MG PO CAPS: 100.0000 mg | ORAL_CAPSULE | Freq: Three times a day (TID) | ORAL | 5 refills | Status: DC | PRN

## 2017-10-03 MED ORDER — TIOTROPIUM BROMIDE-OLODATEROL 2.5-2.5 MCG/ACT IN AERS: 2.0000 | INHALATION_SPRAY | Freq: Every day | RESPIRATORY_TRACT | 12 refills | Status: DC

## 2017-10-03 MED ORDER — RAMELTEON 8 MG PO TABS: 8.0000 mg | ORAL_TABLET | Freq: Every day | ORAL | 3 refills | Status: DC

## 2017-10-03 MED ORDER — CIPROFLOXACIN HCL 500 MG PO TABS: 500.0000 mg | ORAL_TABLET | Freq: Two times a day (BID) | ORAL | 0 refills | Status: DC

## 2017-10-03 MED ORDER — RAMELTEON 8 MG PO TABS: 8.0000 mg | ORAL_TABLET | Freq: Every evening | ORAL | 3 refills | Status: DC | PRN

## 2017-10-03 MED ORDER — CIPROFLOXACIN HCL 500 MG PO TABS: 500.0000 mg | ORAL_TABLET | Freq: Two times a day (BID) | ORAL | 0 refills | Status: AC

## 2017-10-03 NOTE — Telephone Encounter (Signed)
Rx was for ramelteon. Please call pharmacy and see if there is prior authorization or something that might get it covered.

## 2017-10-03 NOTE — Telephone Encounter (Signed)
Received notification from patients pharmacy that Ramelteon has been approved. I called patient and pharmacy to notify them of this.

## 2017-10-03 NOTE — Progress Notes (Signed)
Patient: Yvette Riley Female    DOB: Dec 17, 1955   62 y.o.   MRN: [id number] Visit Date: 10/03/2017  Today's Provider: Lelon Huh, MD   Chief Complaint  Patient presents with  . Cough   Subjective:    Cough  This is a recurrent problem. The current episode started more than 1 month ago. The problem has been gradually worsening. The cough is productive of sputum (bright yellow colored mucus). Associated symptoms include headaches, shortness of breath and wheezing. Pertinent negatives include no chest pain, chills, ear pain, fever, hemoptysis, nasal congestion, postnasal drip, rhinorrhea, sore throat or sweats. Treatments tried: antibiotic a few weeks ago. The treatment provided mild relief. Her past medical history is significant for COPD.   She had prescription for Stiolto which had been working well, but states she ran out of about 2 weeks ago.   She also reports she continue to have difficulty falling and staying asleep. Even when she take Sonata she only sleeps a few hours and then has to take another, but still doesn't sleep well.      Allergies  Allergen Reactions  . Codeine Anaphylaxis  . Crestor [Rosuvastatin] Anaphylaxis  . Other Anaphylaxis  . Penicillins Anaphylaxis, Rash and Other (See Comments)    Reaction:  Tongue swelling  Has patient had a PCN reaction causing immediate rash, facial/tongue/throat swelling, SOB or lightheadedness with hypotension:  Yes   Has patient had a PCN reaction causing severe rash involving mucus membranes or skin necrosis: No Has patient had a PCN reaction that required hospitalization No Has patient had a PCN reaction occurring within the last 10 years: No If all of the above answers are "NO", then may proceed with Cephalosporin use.  . Yellow Jacket Venom [Bee Venom] Anaphylaxis  . Gemfibrozil Rash, Nausea And Vomiting and Swelling  . Statins Nausea And Vomiting and Swelling  . Trazodone And Nefazodone Nausea And Vomiting  .  Lipitor [Atorvastatin] Rash     Current Outpatient Medications:  .  albuterol (PROVENTIL) (2.5 MG/3ML) 0.083% nebulizer solution, Take 3 mLs (2.5 mg total) by nebulization every 6 (six) hours as needed for wheezing or shortness of breath., Disp: 75 mL, Rfl: 3 .  aspirin EC 81 MG tablet, Take 81 mg by mouth daily. , Disp: , Rfl:  .  benzonatate (TESSALON PERLES) 100 MG capsule, Take 1 capsule (100 mg total) by mouth 3 (three) times daily as needed for cough., Disp: 30 capsule, Rfl: 5 .  clonazePAM (KLONOPIN) 0.5 MG tablet, TAKE 1 TABLET BY MOUTH TWICE DAILY AS NEEDED, Disp: 60 tablet, Rfl: 3 .  ezetimibe (ZETIA) 10 MG tablet, Take 1 tablet (10 mg total) by mouth daily., Disp: 30 tablet, Rfl: 4 .  glucose blood test strip, Contour test strip. Use to check blood sugar once a day for for type 2 diabetes (E11.9), Disp: 100 each, Rfl: 4 .  Lancets MISC, Use to check blood sugar once daily for type 2 diabetes E11.9, Disp: 100 each, Rfl: 4 .  meloxicam (MOBIC) 15 MG tablet, Take 1 tablet (15 mg total) by mouth daily as needed for pain., Disp: 90 tablet, Rfl: 2 .  metFORMIN (GLUCOPHAGE XR) 500 MG 24 hr tablet, Take 2 tablets (1,000 mg total) by mouth daily with breakfast. With meals, Disp: 60 tablet, Rfl: 3 .  NARCAN 4 MG/0.1ML LIQD nasal spray kit, ADMINISTER A SINGLE SPRAY IN ONE NOSTRIL UPON SIGNS OF OPIOID OVERDOSE. CALL 911. REPEAT AFTER 3 MINUTES IF  NO RESPONSE., Disp: , Rfl: 99 .  Nutritional Supplements (GLUCERNA ADVANCE SHAKE) LIQD, Drink 1-2 shakes per day, Disp: 24 Bottle, Rfl: 12 .  oxyCODONE-acetaminophen (PERCOCET) 10-325 MG tablet, One tablet every 4-6 hours as needed, Disp: 180 tablet, Rfl: 0 .  PROAIR HFA 108 (90 Base) MCG/ACT inhaler, INHALE 2 PUFFS BY MOUTH EVERY 6 HOURS AS NEEDED FOR WHEEZING OR SHORTNESS OF BREATH, Disp: , Rfl: 3 .  prochlorperazine (COMPAZINE) 10 MG tablet, Take 1 tablet by mouth every 6 (six) hours as needed., Disp: , Rfl:  .  sucralfate (CARAFATE) 1 g tablet, Take 1  tablet (1 g total) by mouth 4 (four) times daily -  with meals and at bedtime., Disp: 120 tablet, Rfl: 4 .  Tiotropium Bromide-Olodaterol (STIOLTO RESPIMAT) 2.5-2.5 MCG/ACT AERS, Inhale 2 puffs into the lungs daily., Disp: 1 Inhaler, Rfl: 12  (NOT TAKING) .  zaleplon (SONATA) 10 MG capsule, Take one every evening, may take second tablet after two hours if needed, Disp: 60 capsule, Rfl: 3 .  montelukast (SINGULAIR) 10 MG tablet, Take 1 tablet (10 mg total) by mouth at bedtime., Disp: 30 tablet, Rfl: 6  Review of Systems  Constitutional: Positive for fatigue. Negative for appetite change, chills, diaphoresis and fever.  HENT: Positive for congestion. Negative for ear pain, postnasal drip, rhinorrhea and sore throat.   Respiratory: Positive for cough, shortness of breath and wheezing. Negative for hemoptysis and chest tightness.   Cardiovascular: Negative for chest pain and palpitations.  Gastrointestinal: Negative for abdominal pain, nausea and vomiting.  Neurological: Positive for weakness and headaches. Negative for dizziness.    Social History   Tobacco Use  . Smoking status: Former Smoker    Packs/day: 0.25    Years: 45.00    Pack years: 11.25    Types: Cigarettes    Last attempt to quit: 03/17/2016    Years since quitting: 1.5  . Smokeless tobacco: Never Used  . Tobacco comment: previously smoked 2 1/2 ppd for 30 years.   Substance Use Topics  . Alcohol use: No    Alcohol/week: 0.0 standard drinks   Objective:   BP 132/83 (BP Location: Left Arm, Patient Position: Sitting, Cuff Size: Normal)   Pulse 81   Temp 99 F (37.2 C) (Oral)   Resp 16   Wt 167 lb (75.8 kg)   SpO2 96%   BMI 34.90 kg/m  Vitals:   10/03/17 0826  BP: 132/83  Pulse: 81  Resp: 16  Temp: 99 F (37.2 C)  TempSrc: Oral  SpO2: 96%  Weight: 167 lb (75.8 kg)     Physical Exam  General Appearance:    Alert, cooperative, no distress  HENT:   bilateral TM normal without fluid or infection, neck without  nodes, throat normal without erythema or exudate and sinuses nontender  Eyes:    PERRL, conjunctiva/corneas clear, EOM's intact       Lungs:     Mild bilateral expiratory wheezes. No rales, respirations unlabored  Heart:    Regular rate and rhythm  Neurologic:   Awake, alert, oriented x 3. No apparent focal neurological           defect.           Assessment & Plan:     1. Bronchitis  - ciprofloxacin (CIPRO) 500 MG tablet; Take 1 tablet (500 mg total) by mouth 2 (two) times daily.  Dispense: 6 tablet; Refill: 0  2. Chronic obstructive pulmonary disease, unspecified COPD type ([place]) Has been  off of maintenance inhaler and montelukast. Restart both. Given one sample inhaler of Stiolto - Tiotropium Bromide-Olodaterol (STIOLTO RESPIMAT) 2.5-2.5 MCG/ACT AERS; Inhale 2 puffs into the lungs daily.  Dispense: 1 Inhaler; Refill: 12 - montelukast (SINGULAIR) 10 MG tablet; Take 1 tablet (10 mg total) by mouth at bedtime.  Dispense: 90 tablet; Refill: 3  3. Cough refill- benzonatate (TESSALON PERLES) 100 MG capsule; Take 1 capsule (100 mg total) by mouth 3 (three) times daily as needed for cough.  Dispense: 30 capsule; Refill: 5  4. Insomnia, unspecified type Change sonata to- ramelteon (ROZEREM) 8 MG tablet; Take 1 tablet (8 mg total) by mouth at bedtime as needed for sleep.  Dispense: 30 tablet; Refill: 3  5. Anxiety She takes occasional clonazepam which is effective. Strongly counseled on risks of mixing clonazepam with oxycodone/apap and advised not take within 6 hours of each other or with other sedatives.  - clonazePAM (KLONOPIN) 0.5 MG tablet; Take 1 tablet (0.5 mg total) by mouth 2 (two) times daily as needed.  Dispense: 60 tablet; Refill: 1       Lelon Huh, MD  [facility]

## 2017-10-03 NOTE — Telephone Encounter (Signed)
pt called saying she was in this am and was prescribed a new sleep medication.  When she got to [facility] they told her it was 800.00.  She had them refill her old rx for sleep and wants to know if there is another prescription she can take.  CB# [phone]  Thanks Con Memos

## 2017-10-03 NOTE — Telephone Encounter (Signed)
Please advise 

## 2017-10-13 ENCOUNTER — Telehealth: Payer: Self-pay

## 2017-10-13 ENCOUNTER — Ambulatory Visit
Admission: RE | Admit: 2017-10-13 | Discharge: 2017-10-13 | Disposition: A | Discharge status: Home / Self Care | Payer: Medicare Other | Source: Ambulatory Visit | Attending: Family Medicine | Admitting: Family Medicine

## 2017-10-13 ENCOUNTER — Telehealth: Payer: Self-pay | Admitting: Family Medicine

## 2017-10-13 ENCOUNTER — Encounter: Payer: Self-pay | Admitting: Family Medicine

## 2017-10-13 ENCOUNTER — Ambulatory Visit (INDEPENDENT_AMBULATORY_CARE_PROVIDER_SITE_OTHER): Payer: Medicare Other | Admitting: Family Medicine

## 2017-10-13 VITALS — BP 110/80 | HR 89 | Temp 98.1°F | Resp 16 | Wt 167.0 lb

## 2017-10-13 DIAGNOSIS — M25552 Pain in left hip: Secondary | ICD-10-CM

## 2017-10-13 DIAGNOSIS — M25852 Other specified joint disorders, left hip: Secondary | ICD-10-CM | POA: Diagnosis not present

## 2017-10-13 DIAGNOSIS — S79912A Unspecified injury of left hip, initial encounter: Secondary | ICD-10-CM | POA: Diagnosis not present

## 2017-10-13 DIAGNOSIS — G8929 Other chronic pain: Secondary | ICD-10-CM

## 2017-10-13 DIAGNOSIS — Z23 Encounter for immunization: Secondary | ICD-10-CM | POA: Diagnosis not present

## 2017-10-13 DIAGNOSIS — M545 Low back pain: Principal | ICD-10-CM
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
## 2017-10-13 NOTE — Telephone Encounter (Signed)
-----   Message from Carmon Ginsberg, [place] sent at 10/13/2017  1:44 PM EDT ----- No fractures in pelvis or hip.

## 2017-10-13 NOTE — Patient Instructions (Addendum)
We will call you with the x-ray result. May use Tylenol up to 3000 mg/day for pain.

## 2017-10-13 NOTE — Telephone Encounter (Signed)
Pt called asking for an update on her Rx request. Please advise. Thanks TNP

## 2017-10-13 NOTE — Telephone Encounter (Signed)
Please advise 
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
## 2017-10-14 MED ORDER — OXYCODONE-ACETAMINOPHEN 10-325 MG PO TABS: ORAL_TABLET | ORAL | 0 refills | Status: DC
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
## 2017-10-14 NOTE — Telephone Encounter (Signed)
Have sent prescription for one week supply.
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
## 2017-10-14 NOTE — Telephone Encounter (Signed)
Patient advised as below.  

## 2017-10-15 NOTE — Telephone Encounter (Signed)
[id number]

## 2017-10-15 NOTE — Telephone Encounter (Signed)
-----   Message from Carmon Ginsberg, [place] sent at 10/13/2017  1:44 PM EDT ----- No fractures in pelvis or hip.

## 2017-10-16 NOTE — Telephone Encounter (Signed)
[id number] 

## 2017-10-20 NOTE — Telephone Encounter (Signed)
Patient advised as below.  

## 2017-10-20 NOTE — Progress Notes (Signed)
Patient: Yvette Riley Female    DOB: Dec 25, 1955   62 y.o.   MRN: [id number] Visit Date: 10/21/2017  Today's Provider: Lelon Huh, MD   Chief Complaint  Patient presents with  . Follow-up   Subjective:    HPI  Left hip pain From 10/13/2017-seen by Mariel Sleet. X-ray ordered, showing no fracture. Discussed using Tylenol up to approximately 3000 mg qd. Mikki Santee advised patient to follow up with pcp in 1 week to discuss frequent falls. Today patient comes in reporting that her hip pain is still bothering her, but has improved since the last visit. She also states she has been having frequent falls within the past several months. She feels off balanced and says the room seems to be spinning. Dizzy spells several times a week. Occasionally associated with headaches.   States that one leg or the other feels like it just gives out when she falls. Usually uses a cane and has a walker.She wants a Corporate investment [employer]. She does have walker which she uses, but does not help when legs get weak and give out from underneath her. She does have frequent global headaches. She does have lung cancer and had mri brain in November 2018 for surveillance, but states she was not having an neurological sx at that time.    Allergies  Allergen Reactions  . Codeine Anaphylaxis  . Crestor [Rosuvastatin] Anaphylaxis  . Other Anaphylaxis  . Penicillins Anaphylaxis, Rash and Other (See Comments)    Reaction:  Tongue swelling  Has patient had a PCN reaction causing immediate rash, facial/tongue/throat swelling, SOB or lightheadedness with hypotension:  Yes   Has patient had a PCN reaction causing severe rash involving mucus membranes or skin necrosis: No Has patient had a PCN reaction that required hospitalization No Has patient had a PCN reaction occurring within the last 10 years: No If all of the above answers are "NO", then may proceed with Cephalosporin use.  . Yellow Jacket Venom [Bee Venom] Anaphylaxis  .  Gemfibrozil Rash, Nausea And Vomiting and Swelling  . Statins Nausea And Vomiting and Swelling  . Trazodone And Nefazodone Nausea And Vomiting  . Lipitor [Atorvastatin] Rash     Current Outpatient Medications:  .  albuterol (PROVENTIL) (2.5 MG/3ML) 0.083% nebulizer solution, Take 3 mLs (2.5 mg total) by nebulization every 6 (six) hours as needed for wheezing or shortness of breath., Disp: 75 mL, Rfl: 3 .  aspirin EC 81 MG tablet, Take 81 mg by mouth daily. , Disp: , Rfl:  .  benzonatate (TESSALON PERLES) 100 MG capsule, Take 1 capsule (100 mg total) by mouth 3 (three) times daily as needed for cough., Disp: 30 capsule, Rfl: 5 .  clonazePAM (KLONOPIN) 0.5 MG tablet, Take 1 tablet (0.5 mg total) by mouth 2 (two) times daily as needed., Disp: 60 tablet, Rfl: 1 .  ezetimibe (ZETIA) 10 MG tablet, Take 1 tablet (10 mg total) by mouth daily., Disp: 30 tablet, Rfl: 4 .  glucose blood test strip, Contour test strip. Use to check blood sugar once a day for for type 2 diabetes (E11.9), Disp: 100 each, Rfl: 4 .  Lancets MISC, Use to check blood sugar once daily for type 2 diabetes E11.9, Disp: 100 each, Rfl: 4 .  meloxicam (MOBIC) 15 MG tablet, Take 1 tablet (15 mg total) by mouth daily as needed for pain., Disp: 90 tablet, Rfl: 2 .  metFORMIN (GLUCOPHAGE XR) 500 MG 24 hr tablet, Take 2 tablets (1,000 mg  total) by mouth daily with breakfast. With meals, Disp: 60 tablet, Rfl: 3 .  montelukast (SINGULAIR) 10 MG tablet, Take 1 tablet (10 mg total) by mouth at bedtime., Disp: 90 tablet, Rfl: 3 .  NARCAN 4 MG/0.1ML LIQD nasal spray kit, ADMINISTER A SINGLE SPRAY IN ONE NOSTRIL UPON SIGNS OF OPIOID OVERDOSE. CALL 911. REPEAT AFTER 3 MINUTES IF NO RESPONSE., Disp: , Rfl: 99 .  Nutritional Supplements (GLUCERNA ADVANCE SHAKE) LIQD, Drink 1-2 shakes per day, Disp: 24 Bottle, Rfl: 12 .  oxyCODONE-acetaminophen (PERCOCET) 10-325 MG tablet, One tablet every 4-6 hours as needed, Disp: 45 tablet, Rfl: 0 .  PROAIR HFA 108  (90 Base) MCG/ACT inhaler, INHALE 2 PUFFS BY MOUTH EVERY 6 HOURS AS NEEDED FOR WHEEZING OR SHORTNESS OF BREATH, Disp: , Rfl: 3 .  ramelteon (ROZEREM) 8 MG tablet, Take 1 tablet (8 mg total) by mouth at bedtime as needed for sleep., Disp: 30 tablet, Rfl: 3 .  sucralfate (CARAFATE) 1 g tablet, Take 1 tablet (1 g total) by mouth 4 (four) times daily -  with meals and at bedtime., Disp: 120 tablet, Rfl: 4 .  Tiotropium Bromide-Olodaterol (STIOLTO RESPIMAT) 2.5-2.5 MCG/ACT AERS, Inhale 2 puffs into the lungs daily., Disp: 1 Inhaler, Rfl: 12 .  zaleplon (SONATA) 10 MG capsule, TAKE 1 CAPSULE BY MOUTH IN THE EVENING. MAY TAKE SECOND CAPSULE AFTER 2 HOURS IF NEEDED, Disp: , Rfl: 3 .  prochlorperazine (COMPAZINE) 10 MG tablet, Take 1 tablet by mouth every 6 (six) hours as needed., Disp: , Rfl:   Review of Systems  Constitutional: Negative for appetite change, chills, fatigue and fever.  Respiratory: Negative for chest tightness and shortness of breath.   Cardiovascular: Negative for chest pain and palpitations.  Gastrointestinal: Negative for abdominal pain, nausea and vomiting.  Musculoskeletal: Positive for arthralgias (hip pain) and back pain.  Neurological: Positive for light-headedness. Negative for dizziness and weakness.       Off balanced, feels like the room is spinning    Social History   Tobacco Use  . Smoking status: Former Smoker    Packs/day: 0.25    Years: 45.00    Pack years: 11.25    Types: Cigarettes    Last attempt to quit: 03/17/2016    Years since quitting: 1.5  . Smokeless tobacco: Never Used  . Tobacco comment: previously smoked 2 1/2 ppd for 30 years.   Substance Use Topics  . Alcohol use: No    Alcohol/week: 0.0 standard drinks   Objective:   BP 109/77 (BP Location: Left Arm, Patient Position: Sitting, Cuff Size: Large)   Pulse 97   Temp 98.5 F (36.9 C) (Oral)   Resp 16   Wt 167 lb (75.8 kg)   SpO2 98% Comment: room air  BMI 34.90 kg/m  Vitals:   10/21/17  0811  BP: 109/77  Pulse: 97  Resp: 16  Temp: 98.5 F (36.9 C)  TempSrc: Oral  SpO2: 98%  Weight: 167 lb (75.8 kg)     Physical Exam    General Appearance:    Alert, cooperative, no distress  Eyes:    PERRL, conjunctiva/corneas clear, EOM's intact       Lungs:     Clear to auscultation bilaterally, respirations unlabored  Heart:    Regular rate and rhythm  Neurologic:   Awake, alert, oriented x 3. Positive Romberg. No Normal finger to nose. No focal weakness. Stands slowly but does not require assistance. [name]  Assessment & Plan:     1. Nonintractable episodic headache, unspecified headache type Considering lung cancer history she needs to have CNS mets ruled out.  - MR MRA HEAD WO CONTRAST; Future  2. Ataxia, frequent falls.   - Ambulatory referral to Physical Therapy  3. Small cell lung cancer, right ([place])  - MR MRA HEAD WO CONTRAST; Future       Lelon Huh, MD  [facility]

## 2017-10-21 ENCOUNTER — Ambulatory Visit (INDEPENDENT_AMBULATORY_CARE_PROVIDER_SITE_OTHER): Payer: Medicare Other | Admitting: Family Medicine

## 2017-10-21 ENCOUNTER — Encounter: Payer: Self-pay | Admitting: Family Medicine

## 2017-10-21 VITALS — BP 109/77 | HR 97 | Temp 98.5°F | Resp 16 | Wt 167.0 lb

## 2017-10-21 DIAGNOSIS — C3491 Malignant neoplasm of unspecified part of right bronchus or lung: Secondary | ICD-10-CM

## 2017-10-21 DIAGNOSIS — R270 Ataxia, unspecified: Secondary | ICD-10-CM

## 2017-10-21 DIAGNOSIS — R51 Headache: Secondary | ICD-10-CM | POA: Diagnosis not present

## 2017-10-21 DIAGNOSIS — R296 Repeated falls: Secondary | ICD-10-CM

## 2017-10-21 DIAGNOSIS — R519 Headache, unspecified: Secondary | ICD-10-CM

## 2017-10-23 ENCOUNTER — Other Ambulatory Visit: Payer: Self-pay | Admitting: Family Medicine

## 2017-10-23 DIAGNOSIS — M545 Low back pain: Principal | ICD-10-CM

## 2017-10-23 DIAGNOSIS — G8929 Other chronic pain: Secondary | ICD-10-CM

## 2017-10-23 NOTE — Telephone Encounter (Signed)
Pt is needing her pain medication to be filled on time. She states she keeps having issues with her insurance to approve to get her medicine filled. oxyCODONE-acetaminophen (PERCOCET) 10-325 MG tablet   Please fill at: [facility] ([facility] [place] [address] [phone] (Phone) [phone] (Fax)   Thanks,  [employer]

## 2017-10-24 ENCOUNTER — Telehealth: Payer: Self-pay | Admitting: Family Medicine

## 2017-10-24 MED ORDER — OXYCODONE-ACETAMINOPHEN 10-325 MG PO TABS: ORAL_TABLET | ORAL | 0 refills | Status: DC

## 2017-10-24 NOTE — Telephone Encounter (Signed)
Medications have already been filled.

## 2017-10-24 NOTE — Telephone Encounter (Signed)
Pt needs refill on   Benzonatate 100mg    Oxycodone 10-325  She uses [address]  Thanks  [employer]

## 2017-10-27 ENCOUNTER — Telehealth: Payer: Self-pay | Admitting: Family Medicine

## 2017-10-27 ENCOUNTER — Other Ambulatory Visit: Payer: Self-pay | Admitting: Family Medicine

## 2017-10-27 NOTE — Telephone Encounter (Signed)
Pt needs refill on  Oxycodone 10-325  [facility] [address]  Thanks teri

## 2017-10-27 NOTE — Telephone Encounter (Signed)
Rx sent to pharmacy 10/24/2017. Left message on pt's vm advising.
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
## 2017-10-28 ENCOUNTER — Other Ambulatory Visit: Payer: Self-pay | Admitting: Family Medicine
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
## 2017-10-28 NOTE — Telephone Encounter (Signed)
Please advise 

## 2017-10-29 NOTE — Telephone Encounter (Signed)
Pt calling back to make sure Dr. Caryn Section fills her Rx for the original amount of 180 pills and it needs to be filled on the 5th.  Pt is going out of town in the 6th.  Thanks, [employer]

## 2017-10-31 ENCOUNTER — Ambulatory Visit (INDEPENDENT_AMBULATORY_CARE_PROVIDER_SITE_OTHER): Payer: Medicare Other | Admitting: Family Medicine

## 2017-10-31 ENCOUNTER — Encounter: Payer: Self-pay | Admitting: Family Medicine

## 2017-10-31 DIAGNOSIS — M5442 Lumbago with sciatica, left side: Secondary | ICD-10-CM

## 2017-10-31 DIAGNOSIS — M5441 Lumbago with sciatica, right side: Secondary | ICD-10-CM

## 2017-10-31 DIAGNOSIS — G8929 Other chronic pain: Secondary | ICD-10-CM

## 2017-10-31 MED ORDER — OXYCODONE-ACETAMINOPHEN 10-325 MG PO TABS: ORAL_TABLET | ORAL | 0 refills | Status: DC

## 2017-10-31 NOTE — Progress Notes (Signed)
Patient: Yvette Riley Female    DOB: 1955/10/30   62 y.o.   MRN: [id number] Visit Date: 10/31/2017  Today's Provider: Lelon Huh, MD   Chief Complaint  Patient presents with  . Follow-up   Subjective:    HPI Chronic pain management: Patient is here today to discuss getting her prescription filled. She called a few weeks reporting her medication had been stolen and brought a copy of the police report. She was prescribed 45 tablets to get by until her regular refill is due, which is 11-01-2017. She states she is going to  [place] to see her granddaughter this weekend and wants to go ahead and get prescription filled.     Allergies  Allergen Reactions  . Codeine Anaphylaxis  . Crestor [Rosuvastatin] Anaphylaxis  . Other Anaphylaxis  . Penicillins Anaphylaxis, Rash and Other (See Comments)    Reaction:  Tongue swelling  Has patient had a PCN reaction causing immediate rash, facial/tongue/throat swelling, SOB or lightheadedness with hypotension:  Yes   Has patient had a PCN reaction causing severe rash involving mucus membranes or skin necrosis: No Has patient had a PCN reaction that required hospitalization No Has patient had a PCN reaction occurring within the last 10 years: No If all of the above answers are "NO", then may proceed with Cephalosporin use.  . [place] Venom [Bee Venom] Anaphylaxis  . Gemfibrozil Rash, Nausea And Vomiting and Swelling  . Statins Nausea And Vomiting and Swelling  . Trazodone And Nefazodone Nausea And Vomiting  . Lipitor [Atorvastatin] Rash     Current Outpatient Medications:  .  albuterol (PROVENTIL) (2.5 MG/3ML) 0.083% nebulizer solution, Take 3 mLs (2.5 mg total) by nebulization every 6 (six) hours as needed for wheezing or shortness of breath., Disp: 75 mL, Rfl: 3 .  aspirin EC 81 MG tablet, Take 81 mg by mouth daily. , Disp: , Rfl:  .  benzonatate (TESSALON PERLES) 100 MG capsule, Take 1 capsule (100 mg total) by mouth 3  (three) times daily as needed for cough., Disp: 30 capsule, Rfl: 5 .  clonazePAM (KLONOPIN) 0.5 MG tablet, Take 1 tablet (0.5 mg total) by mouth 2 (two) times daily as needed., Disp: 60 tablet, Rfl: 1 .  ezetimibe (ZETIA) 10 MG tablet, Take 1 tablet (10 mg total) by mouth daily., Disp: 30 tablet, Rfl: 4 .  glucose blood test strip, Contour test strip. Use to check blood sugar once a day for for type 2 diabetes (E11.9), Disp: 100 each, Rfl: 4 .  Lancets MISC, Use to check blood sugar once daily for type 2 diabetes E11.9, Disp: 100 each, Rfl: 4 .  meloxicam (MOBIC) 15 MG tablet, Take 1 tablet (15 mg total) by mouth daily as needed for pain., Disp: 90 tablet, Rfl: 2 .  metFORMIN (GLUCOPHAGE XR) 500 MG 24 hr tablet, Take 2 tablets (1,000 mg total) by mouth daily with breakfast. With meals, Disp: 60 tablet, Rfl: 3 .  montelukast (SINGULAIR) 10 MG tablet, Take 1 tablet (10 mg total) by mouth at bedtime., Disp: 90 tablet, Rfl: 3 .  NARCAN 4 MG/0.1ML LIQD nasal spray kit, ADMINISTER A SINGLE SPRAY IN ONE NOSTRIL UPON SIGNS OF OPIOID OVERDOSE. CALL 911. REPEAT AFTER 3 MINUTES IF NO RESPONSE., Disp: , Rfl: 99 .  Nutritional Supplements (GLUCERNA ADVANCE SHAKE) LIQD, Drink 1-2 shakes per day, Disp: 24 Bottle, Rfl: 12 .  oxyCODONE-acetaminophen (PERCOCET) 10-325 MG tablet, One tablet every 4-6 hours as needed, Disp: 45  tablet, Rfl: 0 .  PROAIR HFA 108 (90 Base) MCG/ACT inhaler, INHALE 2 PUFFS BY MOUTH EVERY 6 HOURS AS NEEDED FOR WHEEZING OR SHORTNESS OF BREATH, Disp: , Rfl: 3 .  prochlorperazine (COMPAZINE) 10 MG tablet, Take 1 tablet by mouth every 6 (six) hours as needed., Disp: , Rfl:  .  ramelteon (ROZEREM) 8 MG tablet, Take 1 tablet (8 mg total) by mouth at bedtime as needed for sleep., Disp: 30 tablet, Rfl: 3 .  sucralfate (CARAFATE) 1 g tablet, Take 1 tablet (1 g total) by mouth 4 (four) times daily -  with meals and at bedtime., Disp: 120 tablet, Rfl: 4 .  Tiotropium Bromide-Olodaterol (STIOLTO RESPIMAT)  2.5-2.5 MCG/ACT AERS, Inhale 2 puffs into the lungs daily., Disp: 1 Inhaler, Rfl: 12 .  zaleplon (SONATA) 10 MG capsule, TAKE 1 CAPSULE BY MOUTH IN THE EVENING. MAY TAKE SECOND CAPSULE AFTER 2 HOURS IF NEEDED, Disp: , Rfl: 3  Review of Systems  Constitutional: Negative for appetite change, chills, fatigue and fever.  Respiratory: Positive for cough (productive with yellow sputum). Negative for chest tightness and shortness of breath.   Cardiovascular: Negative for chest pain and palpitations.  Gastrointestinal: Negative for abdominal pain, nausea and vomiting.  Musculoskeletal: Positive for arthralgias and back pain.  Neurological: Negative for dizziness and weakness.    Social History   Tobacco Use  . Smoking status: Former Smoker    Packs/day: 0.25    Years: 45.00    Pack years: 11.25    Types: Cigarettes    Last attempt to quit: 03/17/2016    Years since quitting: 1.6  . Smokeless tobacco: Never Used  . Tobacco comment: previously smoked 2 1/2 ppd for 30 years.   Substance Use Topics  . Alcohol use: No    Alcohol/week: 0.0 standard drinks   Objective:   BP 127/78 (BP Location: Left Arm, Patient Position: Sitting, Cuff Size: Large)   Pulse 94   Temp 99.1 F (37.3 C) (Oral)   Resp 18   Wt 164 lb (74.4 kg)   SpO2 97% Comment: room air  BMI 34.28 kg/m  Vitals:   10/31/17 1455  BP: 127/78  Pulse: 94  Resp: 18  Temp: 99.1 F (37.3 C)  TempSrc: Oral  SpO2: 97%  Weight: 164 lb (74.4 kg)     Physical Exam      Assessment & Plan:     1. Chronic bilateral low back pain with bilateral sciatica She is not due for office visit today and no changes in management were made. She only needs prescription refilled.  - oxyCODONE-acetaminophen (PERCOCET) 10-325 MG tablet; One tablet every 4-6 hours as needed  Dispense: 180 tablet; Refill: 0      Lelon Huh, MD  [facility]

## 2017-11-03 ENCOUNTER — Telehealth: Payer: Self-pay | Admitting: Family Medicine

## 2017-11-03 ENCOUNTER — Ambulatory Visit: Payer: Medicare Other | Attending: Family Medicine

## 2017-11-03 DIAGNOSIS — R2681 Unsteadiness on feet: Secondary | ICD-10-CM | POA: Diagnosis not present

## 2017-11-03 DIAGNOSIS — M6281 Muscle weakness (generalized): Secondary | ICD-10-CM | POA: Insufficient documentation

## 2017-11-03 DIAGNOSIS — R262 Difficulty in walking, not elsewhere classified: Secondary | ICD-10-CM | POA: Diagnosis not present

## 2017-11-03 NOTE — Telephone Encounter (Signed)
Pt was letting Dr. Caryn Section know that PT said she has  Neuropathy. Pt thinks this might explain the pain she's been having.  Thanks, [employer]

## 2017-11-03 NOTE — Therapy (Signed)
[place] MAIN [facility] SERVICES [address] [place], [place], [address] Phone: [phone]   Fax:  [phone]  Physical Therapy Evaluation  Patient Details  Name: Yvette Riley MRN: [id number] Date of Birth: [birth year] Referring Provider (PT): Lelon Huh   Encounter Date: 11/03/2017  PT End of Session - 11/03/17 0917    Visit Number  1    Number of Visits  13    Date for PT Re-Evaluation  12/15/17    Authorization Type  Progress note: 1/10    Authorization Time Period  Last goals: 11/03/17    PT Start Time  0800    PT Stop Time  0900    PT Time Calculation (min)  60 min    Equipment Utilized During Treatment  Gait belt    Activity Tolerance  Patient tolerated treatment well    Behavior During Therapy  [facility] for tasks assessed/performed       Past Medical History:  Diagnosis Date  . Allergy   . Anemia   . Anxiety   . Arthritis   . Asthma   . Blood transfusion without reported diagnosis   . C. difficile diarrhea 04/07/2016  . CAP (community acquired pneumonia) 03/21/2015  . Combined forms of age-related cataract of both eyes 05/06/2016  . COPD (chronic obstructive pulmonary disease) ([place])   . Diabetes mellitus without complication ([place])   . Displacement of lumbar intervertebral disc without myelopathy   . Emphysema of lung ([place])   . GERD (gastroesophageal reflux disease)   . Glaucoma   . History of chicken pox   . Hypertension   . Pneumonia 03/29/2015  . Shortness of breath   . Small cell lung cancer [facility] [place])     Past Surgical History:  Procedure Laterality Date  . CESAREAN SECTION    . TUBAL LIGATION      There were no vitals filed for this visit.   Subjective Assessment - 11/03/17 0832    Subjective  Difficulty with balance and ataxia    Pertinent History  Pt reports that in 2000 she fell and hurt her back. The pain has persisted since that time and it radiates down her LLE to her calf. She has started having some pain  in her RLE. Pt states that she has been very weak lately and falling frequently. She reports that she falls "at least once/week." She also complains of "the room tilting" and it "pretty much stays with me." No aggravation of symptoms with head turns or looking up. She has a walker and a single point cane. She uses the cane for shorter distances and the walker for longer distances. Pt reports bilateral LE buckling. She denies syncope but does endorse occasional lightheadedness. She does have frequent global headaches for the last 6 months. She gets the headaches at least once/week that last approximately 45 min to 1 hour. She does have lung cancer and had mri brain in November 2018 without signs of metastatic disease to the brain. She is currently in remission. She reports bilateral LE numbness/tingling as well as fasciculations in RUE. Pt reports that a couple weeks ago she fell and struck her head on the concrete. She had plain films per pt report without signs of acute injury.     Limitations  Walking    Diagnostic tests  MRI without signs of metastatic disease in brain    Patient Stated Goals  Improve balance and reduce falls    Currently in Pain?  Yes  Back pain 9/10, unrelated to episode        [facility] PT Assessment - 11/03/17 0758      Assessment   Medical Diagnosis  Ataxia    Referring Provider (PT)  Lelon Huh    Onset Date/Surgical Date  03/28/17   Approximate   Hand Dominance  Right    Next MD Visit  11/07/17    Prior Therapy  None for balance      Precautions   Precautions  Fall      Restrictions   Weight Bearing Restrictions  No      Balance Screen   Has the patient fallen in the past 6 months  Yes    How many times?  Once/week for the last 6 months    Has the patient had a decrease in activity level because of a fear of falling?   Yes    Is the patient reluctant to leave their home because of a fear of falling?   Yes      [place] residence    Living Arrangements  Alone    Available Help at Discharge  Family    Type of [place] to enter    Entrance Stairs-Number of Steps  5    Entrance Stairs-Rails  Can reach both    [place]  One level    [place] - 2 wheels;Cane - single point    Additional Comments  Normal height commode      Prior Function   Level of Independence  Independent;Other (comment)   Eats out a lot or microwave due to back pain with standing   Vocation  Retired    Chief Technology Officer, watch TV, reading      Cognition   Overall Cognitive Status  Within Functional Limits for tasks assessed      Observation/Other Assessments   Other Surveys   Other Surveys    Activities of Balance Confidence Scale (ABC Scale)   40.63%      Standardized Balance Assessment   Standardized Balance Assessment  Timed Up and Go Test;Five Times Sit to Stand;10 meter walk test;Berg Balance Test    Five times sit to stand comments   19.8    10 Meter Walk  Self-selected: 18.8s = 0.53 m/s; Fastest: 15.5 s = 0.64 m/s      Berg Balance Test   Sit to Stand  Able to stand without using hands and stabilize independently    Standing Unsupported  Able to stand safely 2 minutes    Sitting with Back Unsupported but Feet Supported on Floor or Stool  Able to sit safely and securely 2 minutes    Stand to Sit  Sits safely with minimal use of hands    Transfers  Able to transfer safely, minor use of hands    Standing Unsupported with Eyes Closed  Able to stand 10 seconds with supervision    Standing Ubsupported with Feet Together  Able to place feet together independently and stand for 1 minute with supervision    From Standing, Reach Forward with Outstretched Arm  Can reach forward >12 cm safely (5")    From Standing Position, Pick up Object from Floor  Able to pick up shoe safely and easily    From Standing Position, Turn to Look Behind Over each Shoulder  Needs supervision when turning     Turn 360  Degrees  Able to turn 360 degrees safely one side only in 4 seconds or less    Standing Unsupported, Alternately Place Feet on Step/Stool  Able to complete 4 steps without aid or supervision    Standing Unsupported, One Foot in Front  Able to plae foot ahead of the other independently and hold 30 seconds    Standing on One Leg  Tries to lift leg/unable to hold 3 seconds but remains standing independently    Total Score  43      Timed Up and Go Test   TUG  Normal TUG           SUBJECTIVE  Chief complaint: Pt reports that in 2000 she fell and hurt her back. The pain has persisted since that time and it radiates down her LLE to her calf. She has started having some pain in her RLE. Pt states that she has been very weak lately and falling frequently. She reports that she falls "at least once/week." She also complains of "the room tilting" and it "pretty much stays with me." No aggravation of symptoms with head turns or looking up. She has a walker and a single point cane. She uses the cane for shorter distances and the walker for longer distances. Pt reports bilateral LE buckling. She denies syncope but does endorse occasional lightheadedness. She does have frequent global headaches for the last 6 months. She gets the headaches at least once/week that last approximately 45 min to 1 hour. She does have lung cancer and had mri brain in November 2018 without signs of metastatic disease to the brain. She is currently in remission. She reports bilateral LE numbness/tingling as well as fasciculations in RUE. Pt reports that a couple weeks ago she fell and struck her head on the concrete. She had plain films per pt report without signs of acute injury.  Onset: Balance issues for 6-7 months  Imaging: MRI brain without signs of metastatic disease to the brain. Pt has a MRI/MRA brain in two days on 11/05/17 Recent changes in overall health/medication: Pt reports persistent cough, Metformin recently  decreased Directional pattern for falls: "It's almost like I trip and go sideways" Prior history of physical therapy for balance: None Follow-up appointment with MD: 11/07/17   OBJECTIVE  MUSCULOSKELETAL: Tremor: Absent Bulk: Normal Tone: Normal, no clonus  Posture Forward head and rounded shoulders.   Gait Decreased gait speed with decreased step length. Pt ambulates with spc in RUE.  Strength R/L 4/4- Hip flexion 4-/4- Hip external rotation 4-/4- Hip internal rotation 4-/4- Hip abduction 4/4 Knee extension 4/4 Knee flexion 4-/4- Ankle Dorsiflexion  NEUROLOGICAL:  Mental Status Patient is oriented to person, place and time.  Recent memory is intact.  Remote memory is intact.  Attention span and concentration are somewhat divided during testing  Expressive speech is intact.  Patient's fund of knowledge is within normal limits for educational level.  Cranial Nerves Visual acuity and visual fields are intact  Extraocular muscles are intact  Facial sensation is intact bilaterally  Facial strength is intact bilaterally  Hearing is normal as tested by gross conversation Palate elevates midline, normal phonation  Shoulder shrug strength is intact  Tongue protrudes midline   Sensation Pt reports decreased sensation in random pattern throughout LUE and LLE when compared to right side. She reports pain with very light touch to L lateral and medial calf. Pt reports bilateral LE numbness in stocking distribution. Proprioception and hot/cold testing deferred on this date  Reflexes Hoffman Test negative bilateral; Full reflex testing deferred on this date;  Coordination/Cerebellar Finger to Nose: WNL Heel to Shin: WNL Rapid alternating movements: WNL Finger Opposition: WNL Pronator Drift: Negative  FUNCTIONAL OUTCOME MEASURES   Results Comments  BERG 43/56 Fall risk, in need of intervention  DGI  Fall risk, in need of intervention  FGA  Fall risk, in need of  intervention  TUG 18.4 seconds Fall risk, in need of intervention  5TSTS 19.8 seconds Fall risk, in need of intervention  10 Meter Gait Speed Self-selected: 18.8s = 0.53 m/s; Fastest: 15.5 s = 0.64 m/s Below normative values for full community ambulation  ABC Scale 40.63% Low balance confidence  5TSTS    6 Minute Walk Test    mCTSIB      OCULOMOTOR / VESTIBULAR TESTING:  Oculomotor Exam- Room Light  Normal Abnormal Comments  Ocular Alignment N    Ocular ROM N    Spontaneous Nystagmus N    End-Gaze Nystagmus N  Age appropriate  Smooth Pursuit  A Saccadic  Saccades  A Consistently requiring corrective saccade when attempting to hit target  VOR   Attempted but had to stop due to neck pain  VOR Cancellation   Deferred  Left Head Thrust   Deferred  Right Head Thrust   Deferred  Head Shaking Nystagmus     Static Acuity     Dynamic Acuity       Oculomotor Exam- Fixation Suppressed: Deferred  BPPV TESTS: Deferred       Objective measurements completed on examination: See above findings.       TREATMENT  Neuromuscular Re-education  Semitandem balance alternating forward LE x 30s each; Sit to stand without UE support 2 x 5, initially attempted 10 but pt reports increase in pain in LLE so reduced to five; Pt provided written HEP with education about how to safely perform exercises at home.           PT Education - 11/03/17 0917    Education Details  plan of care, HEP    Person(s) Educated  Patient    Methods  Explanation;Demonstration;Verbal cues;Handout    Comprehension  Verbalized understanding       PT Short Term Goals - 11/03/17 0918      PT SHORT TERM GOAL #1   Title  Pt will be independent with HEP in order to improve strength and balance in order to decrease fall risk and improve function at home and work.    Time  3    Period  Weeks    Status  New    Target Date  11/24/17        PT Long Term Goals - 11/03/17 0919      PT LONG TERM GOAL #1    Title  Pt will improve BERG by at least 3 points in order to demonstrate clinically significant improvement in balance.      Baseline  11/03/17: 43/56     Time  6    Period  Weeks    Status  New    Target Date  12/15/17      PT LONG TERM GOAL #2   Title  Pt will improve ABC by at least 13% in order to demonstrate clinically significant improvement in balance confidence.     Baseline  11/03/17: 40.63%    Time  6    Period  Weeks    Status  New    Target Date  12/15/17  PT LONG TERM GOAL #3   Title  Pt will decrease 5TSTS by at least 3 seconds in order to demonstrate clinically significant improvement in LE strength.    Baseline  11/03/17: 19.8s    Time  6    Period  Weeks    Status  New    Target Date  12/15/17      PT LONG TERM GOAL #4   Title  Pt will decrease TUG to below 14 seconds/decrease 23% in order to demonstrate decreased fall risk.    Baseline  11/03/17: 18.4s    Time  6    Period  Weeks    Status  New    Target Date  12/15/17             Plan - 11/03/17 0920    Clinical Impression Statement  Pt is a pleasant 62 year-old female referred for ataxia and frequent falls. PT examination reveals deficits in LE strength and sensation as well as balance. TUG, 5TSTS, 26m gait speed, and BERG all indicate issues with strength and balance and place pt at increased risk for recurrent falls. Pt presents with deficits in strength, mobility, and balance and will benefit from skilled PT services to address deficits to improve safe function at home.     History and Personal Factors relevant to plan of care:   2 personal factors/comorbidities, 3 body systems/activity limitations/participation restrictions      Clinical Presentation  Unstable    Clinical Decision Making  Moderate    Rehab Potential  Fair    PT Frequency  2x / week    PT Duration  6 weeks    PT Treatment/Interventions  ADLs/Self Care Home Management;Aquatic Therapy;Canalith Repostioning;Cryotherapy;Electrical  Stimulation;Iontophoresis 4mg /ml Dexamethasone;Moist Heat;Traction;Ultrasound;DME Instruction;Gait training;Stair training;Therapeutic activities;Therapeutic exercise;Balance training;Neuromuscular re-education;Manual techniques;Passive range of motion;Dry needling;Vestibular    PT Next Visit Plan  Consider monofilament testing, progress strength and balance training    PT Home Exercise Plan  Sit to stand without UE support 3 x 5, semitandem balance alternating forward LE    Consulted and Agree with Plan of Care  Patient       Patient will benefit from skilled therapeutic intervention in order to improve the following deficits and impairments:  Abnormal gait, Decreased balance, Decreased activity tolerance, Decreased strength, Difficulty walking  Visit Diagnosis: Unsteadiness on feet - Plan: PT plan of care cert/re-cert  Difficulty in walking, not elsewhere classified - Plan: PT plan of care cert/re-cert  Muscle weakness (generalized) - Plan: PT plan of care cert/re-cert     Problem List Patient Active Problem List   Diagnosis Date Noted  . Anxiety 07/19/2016  . Small cell lung cancer, right ([place]) 05/13/2016  . Bilateral presbyopia 05/06/2016  . Combined forms of age-related cataract of both eyes 05/06/2016  . Dry eye syndrome of both lacrimal glands 05/06/2016  . Chemoprophylaxis 04/29/2016  . Severe obesity (BMI 35.0-39.9) with comorbidity ([place]) 04/16/2016  . Elevated d-dimer 04/06/2016  . Weakness generalized 04/06/2016  . Sepsis ([place]) 03/17/2016  . Mass of upper lobe of right lung 03/01/2016  . Coronary atherosclerosis 02/29/2016  . Personal history of tobacco use, presenting hazards to health 02/28/2016  . Allergic rhinitis 09/27/2014  . Asthma 09/27/2014  . Anemia 09/27/2014  . Hyperglyceridemia, pure 09/27/2014  . Glaucoma 09/27/2014  . Hip pain 09/27/2014  . Right knee pain 09/27/2014  . Displacement of lumbar intervertebral disc without myelopathy 09/27/2014  .  Thoracic back pain 09/27/2014  . Insomnia 08/11/2014  .  COPD (chronic obstructive pulmonary disease) ([place]) 06/12/2014  . Type 2 diabetes mellitus ([place]) 06/12/2014  . Back pain 06/12/2014  . Essential hypertension 03/28/2014  . Smoking greater than 30 pack years 03/28/2014   Phillips Grout PT, DPT, GCS  Huprich,Jason 11/03/2017, 9:29 AM  [place] MAIN [facility] SERVICES [address] [place], [place], [address] Phone: [phone]   Fax:  [phone]  Name: Yvette Riley MRN: [id number] Date of Birth: [birth year]

## 2017-11-03 NOTE — Patient Instructions (Signed)
Access Code: [email or web address]  URL: [email or web address]  Date: 11/03/2017  Prepared by: Roxana Hires   Exercises  Sit to Stand without Arm Support - 5 reps - 3 sets - 1x daily - 7x weekly  Standing Romberg to 1/2 Tandem Stance - 3 sets - 30s hold - 2x daily - 7x weekly

## 2017-11-05 ENCOUNTER — Ambulatory Visit: Admission: RE | Admit: 2017-11-05 | Payer: Medicare Other | Source: Ambulatory Visit

## 2017-11-05 ENCOUNTER — Telehealth: Payer: Self-pay | Admitting: Family Medicine

## 2017-11-05 NOTE — Telephone Encounter (Signed)
FYI

## 2017-11-05 NOTE — Telephone Encounter (Signed)
Please call patient to reschedule.

## 2017-11-05 NOTE — Telephone Encounter (Signed)
[facility] -  Pt was a no show for her 10 am today - MRA Head without. Pt can be rescheduled.  Please call [phone].  Thanks, [employer]

## 2017-11-07 ENCOUNTER — Ambulatory Visit: Payer: Medicare Other

## 2017-11-10 ENCOUNTER — Telehealth: Payer: Self-pay | Admitting: Family Medicine

## 2017-11-10 MED ORDER — GABAPENTIN 300 MG PO CAPS: ORAL_CAPSULE | ORAL | 1 refills | Status: DC

## 2017-11-10 NOTE — Telephone Encounter (Signed)
Pt wants to know if you will switch the meloxicam to Gabapentin.  She uses [facility]  CB#  [phone]  Thanks  Con Memos

## 2017-11-10 NOTE — Telephone Encounter (Signed)
LMTCB 11/10/2017  Thanks,   -Mickel Baas

## 2017-11-10 NOTE — Telephone Encounter (Signed)
Have sent prescription for gabapentin to [facility]. It is not an anti-inflammatory medications, is just for pain. She doesn't have to stop the meloxicam.

## 2017-11-11 ENCOUNTER — Ambulatory Visit: Payer: Medicare Other | Admitting: Physical Therapy

## 2017-11-11 ENCOUNTER — Encounter: Payer: Self-pay | Admitting: Family Medicine

## 2017-11-11 ENCOUNTER — Ambulatory Visit (INDEPENDENT_AMBULATORY_CARE_PROVIDER_SITE_OTHER): Payer: Medicare Other | Admitting: Family Medicine

## 2017-11-11 ENCOUNTER — Encounter: Payer: Self-pay | Admitting: Physical Therapy

## 2017-11-11 VITALS — BP 104/64 | HR 84 | Temp 98.9°F | Resp 16 | Wt 162.0 lb

## 2017-11-11 DIAGNOSIS — M5126 Other intervertebral disc displacement, lumbar region: Secondary | ICD-10-CM | POA: Diagnosis not present

## 2017-11-11 DIAGNOSIS — R262 Difficulty in walking, not elsewhere classified: Secondary | ICD-10-CM

## 2017-11-11 DIAGNOSIS — M545 Low back pain, unspecified: Secondary | ICD-10-CM

## 2017-11-11 DIAGNOSIS — R053 Chronic cough: Secondary | ICD-10-CM

## 2017-11-11 DIAGNOSIS — G8929 Other chronic pain: Secondary | ICD-10-CM | POA: Diagnosis not present

## 2017-11-11 DIAGNOSIS — R2681 Unsteadiness on feet: Secondary | ICD-10-CM | POA: Diagnosis not present

## 2017-11-11 DIAGNOSIS — M6281 Muscle weakness (generalized): Secondary | ICD-10-CM

## 2017-11-11 DIAGNOSIS — R05 Cough: Secondary | ICD-10-CM | POA: Diagnosis not present

## 2017-11-11 MED ORDER — CIPROFLOXACIN HCL 500 MG PO TABS: 500.0000 mg | ORAL_TABLET | Freq: Two times a day (BID) | ORAL | 0 refills | Status: AC

## 2017-11-11 NOTE — Therapy (Signed)
[place] MAIN [facility] SERVICES [address] [place], [place], [address] Phone: [phone]   Fax:  [phone]  Physical Therapy Treatment  Patient Details  Name: Yvette Riley MRN: [id number] Date of Birth: 1955-07-11 Referring Provider (PT): Lelon Huh   Encounter Date: 11/11/2017  PT End of Session - 11/11/17 0851    Visit Number  2    Number of Visits  13    Date for PT Re-Evaluation  12/15/17    Authorization Type  Progress note: 2/10    Authorization Time Period  Last goals: 11/03/17    PT Start Time  0753    PT Stop Time  0836    PT Time Calculation (min)  43 min    Equipment Utilized During Treatment  Gait belt    Activity Tolerance  Patient tolerated treatment well    Behavior During Therapy  [facility] for tasks assessed/performed       Past Medical History:  Diagnosis Date  . Allergy   . Anemia   . Anxiety   . Arthritis   . Asthma   . Blood transfusion without reported diagnosis   . C. difficile diarrhea 04/07/2016  . CAP (community acquired pneumonia) 03/21/2015  . Combined forms of age-related cataract of both eyes 05/06/2016  . COPD (chronic obstructive pulmonary disease) ([place])   . Diabetes mellitus without complication ([place])   . Displacement of lumbar intervertebral disc without myelopathy   . Emphysema of lung ([place])   . GERD (gastroesophageal reflux disease)   . Glaucoma   . History of chicken pox   . Hypertension   . Pneumonia 03/29/2015  . Shortness of breath   . Small cell lung cancer [facility])     Past Surgical History:  Procedure Laterality Date  . CESAREAN SECTION    . TUBAL LIGATION      There were no vitals filed for this visit.  Subjective Assessment - 11/11/17 0848    Subjective  Patient states that she is not doing well because she is unable to sleep 2/2 to intense pain. Additionally, she c/o of memory issues both short and long term. Patient reports that she fell after getting out of bed when "both  legs buckled" and she reports having landed on a soft surface with no bruising or concern for injury. She has no increased pain today from the fall.    Pertinent History  Pt reports that in 2000 she fell and hurt her back. The pain has persisted since that time and it radiates down her LLE to her calf. She has started having some pain in her RLE. Pt states that she has been very weak lately and falling frequently. She reports that she falls "at least once/week." She also complains of "the room tilting" and it "pretty much stays with me." No aggravation of symptoms with head turns or looking up. She has a walker and a single point cane. She uses the cane for shorter distances and the walker for longer distances. Pt reports bilateral LE buckling. She denies syncope but does endorse occasional lightheadedness. She does have frequent global headaches for the last 6 months. She gets the headaches at least once/week that last approximately 45 min to 1 hour. She does have lung cancer and had mri brain in November 2018 without signs of metastatic disease to the brain. She is currently in remission. She reports bilateral LE numbness/tingling as well as fasciculations in RUE. Pt reports that a couple weeks ago  she fell and struck her head on the concrete. She had plain films per pt report without signs of acute injury.     Limitations  Walking    Diagnostic tests  MRI without signs of metastatic disease in brain    Patient Stated Goals  Improve balance and reduce falls    Currently in Pain?  Yes    Pain Score  7     Pain Location  Back    Pain Orientation  Lower    Pain Descriptors / Indicators  Aching    Pain Type  Chronic pain    Pain Radiating Towards  LE, R>L    Pain Onset  More than a month ago    Pain Frequency  Constant        TREATMENT  Therapeutic Exercise: Diaphragmatic breathing, 3 min, self-tactile cueing (hand on chest, hand on stomach) Hooklying trunk rotation 2 x30sec Single knee to chest  3x30sec each SAQ #2 1x10, 1x5 BLE SLR, 2x5, BLE Supine hip abduction 2# 2x10 LAQ 2#, 2x10   Patient educated on: bed mobility (log roll) to avoid exacerbating back pain; pain science and desensitization through gentle motion; HEP; and graded activity. Patient encouraged to speak with her MD about her concerns with medications and changes in memory.       PT Education - 11/11/17 0850    Education Details  pain education, activity modification, exercise technique, HEP    Person(s) Educated  Patient    Methods  Explanation;Demonstration;Verbal cues    Comprehension  Verbalized understanding;Returned demonstration;Need further instruction       PT Short Term Goals - 11/03/17 1937      PT SHORT TERM GOAL #1   Title  Pt will be independent with HEP in order to improve strength and balance in order to decrease fall risk and improve function at home and work.    Time  3    Period  Weeks    Status  New    Target Date  11/24/17        PT Long Term Goals - 11/03/17 0919      PT LONG TERM GOAL #1   Title  Pt will improve BERG by at least 3 points in order to demonstrate clinically significant improvement in balance.      Baseline  11/03/17: 43/56     Time  6    Period  Weeks    Status  New    Target Date  12/15/17      PT LONG TERM GOAL #2   Title  Pt will improve ABC by at least 13% in order to demonstrate clinically significant improvement in balance confidence.     Baseline  11/03/17: 40.63%    Time  6    Period  Weeks    Status  New    Target Date  12/15/17      PT LONG TERM GOAL #3   Title  Pt will decrease 5TSTS by at least 3 seconds in order to demonstrate clinically significant improvement in LE strength.    Baseline  11/03/17: 19.8s    Time  6    Period  Weeks    Status  New    Target Date  12/15/17      PT LONG TERM GOAL #4   Title  Pt will decrease TUG to below 14 seconds/decrease 23% in order to demonstrate decreased fall risk.    Baseline  11/03/17: 18.4s     Time  6    Period  Weeks    Status  New    Target Date  12/15/17            Plan - 11/11/17 4270    Clinical Impression Statement  Patient presents to clinic with increased pain and decreased function. Patient demonstrates deficits in strength, pain, function, gait, mobility as evidenced by her R Trendelenburg during ambulation, her limitations during LE strengthening in supine, and her persistent and intense pain reports. Patient will benefit from continued skilled therapeutic intervention to address deficits in balance, gait, strength, and pain in order to improve overall QOL, decrease risk of falls, and improve independence during community activities.     Rehab Potential  Fair    PT Frequency  2x / week    PT Duration  6 weeks    PT Treatment/Interventions  ADLs/Self Care Home Management;Aquatic Therapy;Canalith Repostioning;Cryotherapy;Electrical Stimulation;Iontophoresis 4mg /ml Dexamethasone;Moist Heat;Traction;Ultrasound;DME Instruction;Gait training;Stair training;Therapeutic activities;Therapeutic exercise;Balance training;Neuromuscular re-education;Manual techniques;Passive range of motion;Dry needling;Vestibular    PT Next Visit Plan  Consider monofilament testing, progress strength and balance training    PT Home Exercise Plan  Sit to stand without UE support 3 x 5, semitandem balance alternating forward LE    Consulted and Agree with Plan of Care  Patient       Patient will benefit from skilled therapeutic intervention in order to improve the following deficits and impairments:  Abnormal gait, Decreased balance, Decreased activity tolerance, Decreased strength, Difficulty walking  Visit Diagnosis: Difficulty in walking, not elsewhere classified  Unsteadiness on feet  Muscle weakness (generalized)     Problem List Patient Active Problem List   Diagnosis Date Noted  . Anxiety 07/19/2016  . Small cell lung cancer, right ([place]) 05/13/2016  . Bilateral presbyopia  05/06/2016  . Combined forms of age-related cataract of both eyes 05/06/2016  . Dry eye syndrome of both lacrimal glands 05/06/2016  . Chemoprophylaxis 04/29/2016  . Severe obesity (BMI 35.0-39.9) with comorbidity ([place]) 04/16/2016  . Elevated d-dimer 04/06/2016  . Weakness generalized 04/06/2016  . Sepsis ([place]) 03/17/2016  . Mass of upper lobe of right lung 03/01/2016  . Coronary atherosclerosis 02/29/2016  . Personal history of tobacco use, presenting hazards to health 02/28/2016  . Allergic rhinitis 09/27/2014  . Asthma 09/27/2014  . Anemia 09/27/2014  . Hyperglyceridemia, pure 09/27/2014  . Glaucoma 09/27/2014  . Hip pain 09/27/2014  . Right knee pain 09/27/2014  . Displacement of lumbar intervertebral disc without myelopathy 09/27/2014  . Thoracic back pain 09/27/2014  . Insomnia 08/11/2014  . COPD (chronic obstructive pulmonary disease) ([place]) 06/12/2014  . Type 2 diabetes mellitus ([place]) 06/12/2014  . Back pain 06/12/2014  . Essential hypertension 03/28/2014  . Smoking greater than 30 pack years 03/28/2014    Myles Gip PT, DPT [phone] 11/11/2017, 9:24 AM  [place] MAIN [facility] SERVICES [address] [place], [place], [address] Phone: [phone]   Fax:  [phone]  Name: RAYNETTE ARRAS MRN: [id number] Date of Birth: [birth year]

## 2017-11-11 NOTE — Telephone Encounter (Signed)
Pt advised at her OV 11/11/2017   Thanks,   -Mickel Baas

## 2017-11-11 NOTE — Progress Notes (Signed)
Patient: Yvette Riley Female    DOB: 11-Aug-1955   63 y.o.   MRN: [id number] Visit Date: 11/11/2017  Today's Provider: Lelon Huh, MD   Chief Complaint  Patient presents with  . Pain   Subjective:    HPI Chronic Bilateral Low back pain: Patient was last seen for this problem 11 days ago and no changes were made. Since last visit, Gabapentin was added. Today patient comes in wanting to discuss how she should take Gabapentin along with her other medications. She states she feels like she needs to take oxycodone/apap more than every 4 hours. She did take one dose of gabapentin yesterday which she states wore off after 10-11 hours.  She also asks if she can get some stronger cough medication because the Tessalon is no longer helping. Denies any allergy or heartburn sx. She states cough has been worse the last several days and coughing up more dark yellow sputum.     Allergies  Allergen Reactions  . Codeine Anaphylaxis  . Crestor [Rosuvastatin] Anaphylaxis  . Other Anaphylaxis  . Penicillins Anaphylaxis, Rash and Other (See Comments)    Reaction:  Tongue swelling  Has patient had a PCN reaction causing immediate rash, facial/tongue/throat swelling, SOB or lightheadedness with hypotension:  Yes   Has patient had a PCN reaction causing severe rash involving mucus membranes or skin necrosis: No Has patient had a PCN reaction that required hospitalization No Has patient had a PCN reaction occurring within the last 10 years: No If all of the above answers are "NO", then may proceed with Cephalosporin use.  . Yellow Jacket Venom [Bee Venom] Anaphylaxis  . Gemfibrozil Rash, Nausea And Vomiting and Swelling  . Statins Nausea And Vomiting and Swelling  . Trazodone And Nefazodone Nausea And Vomiting  . Lipitor [Atorvastatin] Rash     Current Outpatient Medications:  .  albuterol (PROVENTIL) (2.5 MG/3ML) 0.083% nebulizer solution, Take 3 mLs (2.5 mg total) by nebulization every  6 (six) hours as needed for wheezing or shortness of breath., Disp: 75 mL, Rfl: 3 .  aspirin EC 81 MG tablet, Take 81 mg by mouth daily. , Disp: , Rfl:  .  benzonatate (TESSALON PERLES) 100 MG capsule, Take 1 capsule (100 mg total) by mouth 3 (three) times daily as needed for cough., Disp: 30 capsule, Rfl: 5 .  clonazePAM (KLONOPIN) 0.5 MG tablet, Take 1 tablet (0.5 mg total) by mouth 2 (two) times daily as needed., Disp: 60 tablet, Rfl: 1 .  ezetimibe (ZETIA) 10 MG tablet, Take 1 tablet (10 mg total) by mouth daily., Disp: 30 tablet, Rfl: 4 .  gabapentin (NEURONTIN) 300 MG capsule, One capsule at bedtime for 3 days, then increase to one capsule twice daily, Disp: 60 capsule, Rfl: 1 .  glucose blood test strip, Contour test strip. Use to check blood sugar once a day for for type 2 diabetes (E11.9), Disp: 100 each, Rfl: 4 .  Lancets MISC, Use to check blood sugar once daily for type 2 diabetes E11.9, Disp: 100 each, Rfl: 4 .  meloxicam (MOBIC) 15 MG tablet, Take 1 tablet (15 mg total) by mouth daily as needed for pain., Disp: 90 tablet, Rfl: 2 .  metFORMIN (GLUCOPHAGE XR) 500 MG 24 hr tablet, Take 2 tablets (1,000 mg total) by mouth daily with breakfast. With meals, Disp: 60 tablet, Rfl: 3 .  montelukast (SINGULAIR) 10 MG tablet, Take 1 tablet (10 mg total) by mouth at bedtime., Disp: 90 tablet,  Rfl: 3 .  NARCAN 4 MG/0.1ML LIQD nasal spray kit, ADMINISTER A SINGLE SPRAY IN ONE NOSTRIL UPON SIGNS OF OPIOID OVERDOSE. CALL 911. REPEAT AFTER 3 MINUTES IF NO RESPONSE., Disp: , Rfl: 99 .  Nutritional Supplements (GLUCERNA ADVANCE SHAKE) LIQD, Drink 1-2 shakes per day, Disp: 24 Bottle, Rfl: 12 .  oxyCODONE-acetaminophen (PERCOCET) 10-325 MG tablet, One tablet every 4-6 hours as needed, Disp: 180 tablet, Rfl: 0 .  PROAIR HFA 108 (90 Base) MCG/ACT inhaler, INHALE 2 PUFFS BY MOUTH EVERY 6 HOURS AS NEEDED FOR WHEEZING OR SHORTNESS OF BREATH, Disp: , Rfl: 3 .  prochlorperazine (COMPAZINE) 10 MG tablet, Take 1  tablet by mouth every 6 (six) hours as needed., Disp: , Rfl:  .  ramelteon (ROZEREM) 8 MG tablet, Take 1 tablet (8 mg total) by mouth at bedtime as needed for sleep., Disp: 30 tablet, Rfl: 3 .  sucralfate (CARAFATE) 1 g tablet, Take 1 tablet (1 g total) by mouth 4 (four) times daily -  with meals and at bedtime., Disp: 120 tablet, Rfl: 4 .  Tiotropium Bromide-Olodaterol (STIOLTO RESPIMAT) 2.5-2.5 MCG/ACT AERS, Inhale 2 puffs into the lungs daily., Disp: 1 Inhaler, Rfl: 12 .  zaleplon (SONATA) 10 MG capsule, TAKE 1 CAPSULE BY MOUTH IN THE EVENING. MAY TAKE SECOND CAPSULE AFTER 2 HOURS IF NEEDED, Disp: , Rfl: 3  Review of Systems  Constitutional: Negative for appetite change, chills, fatigue and fever.  Respiratory: Positive for cough and shortness of breath (stable). Negative for chest tightness.   Cardiovascular: Negative for chest pain and palpitations.  Gastrointestinal: Negative for abdominal pain, nausea and vomiting.  Musculoskeletal: Positive for back pain.  Neurological: Negative for dizziness and weakness.    Social History   Tobacco Use  . Smoking status: Former Smoker    Packs/day: 0.25    Years: 45.00    Pack years: 11.25    Types: Cigarettes    Last attempt to quit: 03/17/2016    Years since quitting: 1.6  . Smokeless tobacco: Never Used  . Tobacco comment: previously smoked 2 1/2 ppd for 30 years.   Substance Use Topics  . Alcohol use: No    Alcohol/week: 0.0 standard drinks   Objective:   BP 104/64 (BP Location: Left Arm, Patient Position: Sitting, Cuff Size: Large)   Pulse 84   Temp 98.9 F (37.2 C) (Oral)   Resp 16   Wt 162 lb (73.5 kg)   SpO2 96% Comment: room air  BMI 33.86 kg/m  Vitals:   11/11/17 0955  BP: 104/64  Pulse: 84  Resp: 16  Temp: 98.9 F (37.2 C)  TempSrc: Oral  SpO2: 96%  Weight: 162 lb (73.5 kg)     Physical Exam  General Appearance:    Alert, cooperative, no distress  HENT:   ENT exam normal, no neck nodes or sinus tenderness    Eyes:    PERRL, conjunctiva/corneas clear, EOM's intact       Lungs:     Occasional expiratory wheeze, respirations unlabored  Heart:    Regular rate and rhythm  Neurologic:   Awake, alert, oriented x 3. No apparent focal neurological           defect.          Assessment & Plan:     1. Displacement of lumbar intervertebral disc without myelopathy Continue current pain medications and advised not take more or more frequently than directed on prescription. Counseled on titrating up dose of gabapentin.   2.  Chronic midline low back pain, unspecified whether sciatica present   3. Chronic cough Recommended she follow up with pulmonary, she feels like she is having flare up of bronchitis. Will get her a short course of  - ciprofloxacin (CIPRO) 500 MG tablet; Take 1 tablet (500 mg total) by mouth 2 (two) times daily for 7 days.  Dispense: 14 tablet; Refill: 0       Lelon Huh, MD  [facility]

## 2017-11-12 ENCOUNTER — Encounter: Payer: Self-pay | Admitting: Physician Assistant

## 2017-11-12 ENCOUNTER — Ambulatory Visit
Admission: RE | Admit: 2017-11-12 | Discharge: 2017-11-12 | Disposition: A | Discharge status: Home / Self Care | Payer: Medicare Other | Source: Ambulatory Visit | Attending: Physician Assistant | Admitting: Physician Assistant

## 2017-11-12 ENCOUNTER — Ambulatory Visit (INDEPENDENT_AMBULATORY_CARE_PROVIDER_SITE_OTHER): Payer: Medicare Other | Admitting: Physician Assistant

## 2017-11-12 VITALS — BP 116/82 | HR 100 | Temp 99.0°F | Wt 161.6 lb

## 2017-11-12 DIAGNOSIS — M79605 Pain in left leg: Secondary | ICD-10-CM

## 2017-11-12 DIAGNOSIS — W19XXXA Unspecified fall, initial encounter: Secondary | ICD-10-CM

## 2017-11-12 DIAGNOSIS — S79922A Unspecified injury of left thigh, initial encounter: Secondary | ICD-10-CM | POA: Diagnosis not present

## 2017-11-12 DIAGNOSIS — M25552 Pain in left hip: Secondary | ICD-10-CM | POA: Diagnosis not present

## 2017-11-12 DIAGNOSIS — M47816 Spondylosis without myelopathy or radiculopathy, lumbar region: Secondary | ICD-10-CM | POA: Diagnosis not present

## 2017-11-12 DIAGNOSIS — S8992XA Unspecified injury of left lower leg, initial encounter: Secondary | ICD-10-CM | POA: Diagnosis not present

## 2017-11-12 DIAGNOSIS — M25562 Pain in left knee: Secondary | ICD-10-CM | POA: Diagnosis not present

## 2017-11-12 DIAGNOSIS — S79912A Unspecified injury of left hip, initial encounter: Secondary | ICD-10-CM | POA: Diagnosis not present

## 2017-11-12 NOTE — Progress Notes (Signed)
Patient: Yvette Riley Female    DOB: [birth year]   62 y.o.   MRN: [id number] Visit Date: 11/12/2017  Today's Provider: Trinna Post, PA-C   Chief Complaint  Patient presents with  . Fall   Subjective:    Fall Was the fall witnessed: No  Patient condition before and after the fall: Left leg, hip and lower back pain.  Patient's reaction to the fall: Woke up out of her sleep due to the fall.  Name of the doctor that was notified including date and time: called this morning 11/12/17.  Any interventions and vital signs: yes   Patient reports rolling over and falling out of bed around 6:00 Am this morning. She fell onto a hardwood floor. She did not hit her head nor did she lose consciousness. She is not taking any blood thinners. She denies bruising. She walks at baseline with aid of a walker but uses a wheelchair for ease of mobility. She was able to stand up from the floor after twenty minutes. She reports left hip pain radiating down into her knee. No open wounds.    Allergies  Allergen Reactions  . Codeine Anaphylaxis  . Crestor [Rosuvastatin] Anaphylaxis  . Other Anaphylaxis  . Penicillins Anaphylaxis, Rash and Other (See Comments)    Reaction:  Tongue swelling  Has patient had a PCN reaction causing immediate rash, facial/tongue/throat swelling, SOB or lightheadedness with hypotension:  Yes   Has patient had a PCN reaction causing severe rash involving mucus membranes or skin necrosis: No Has patient had a PCN reaction that required hospitalization No Has patient had a PCN reaction occurring within the last 10 years: No If all of the above answers are "NO", then may proceed with Cephalosporin use.  . Yellow [place] Venom [Bee Venom] Anaphylaxis  . Gemfibrozil Rash, Nausea And Vomiting and Swelling  . Statins Nausea And Vomiting and Swelling  . Trazodone And Nefazodone Nausea And Vomiting  . Lipitor [Atorvastatin] Rash     Current Outpatient Medications:    .  albuterol (PROVENTIL) (2.5 MG/3ML) 0.083% nebulizer solution, Take 3 mLs (2.5 mg total) by nebulization every 6 (six) hours as needed for wheezing or shortness of breath., Disp: 75 mL, Rfl: 3 .  aspirin EC 81 MG tablet, Take 81 mg by mouth daily. , Disp: , Rfl:  .  benzonatate (TESSALON PERLES) 100 MG capsule, Take 1 capsule (100 mg total) by mouth 3 (three) times daily as needed for cough., Disp: 30 capsule, Rfl: 5 .  ciprofloxacin (CIPRO) 500 MG tablet, Take 1 tablet (500 mg total) by mouth 2 (two) times daily for 7 days., Disp: 14 tablet, Rfl: 0 .  clonazePAM (KLONOPIN) 0.5 MG tablet, Take 1 tablet (0.5 mg total) by mouth 2 (two) times daily as needed., Disp: 60 tablet, Rfl: 1 .  ezetimibe (ZETIA) 10 MG tablet, Take 1 tablet (10 mg total) by mouth daily., Disp: 30 tablet, Rfl: 4 .  gabapentin (NEURONTIN) 300 MG capsule, One capsule at bedtime for 3 days, then increase to one capsule twice daily, Disp: 60 capsule, Rfl: 1 .  glucose blood test strip, Contour test strip. Use to check blood sugar once a day for for type 2 diabetes (E11.9), Disp: 100 each, Rfl: 4 .  Lancets MISC, Use to check blood sugar once daily for type 2 diabetes E11.9, Disp: 100 each, Rfl: 4 .  meloxicam (MOBIC) 15 MG tablet, Take 1 tablet (15 mg total) by mouth daily as  needed for pain., Disp: 90 tablet, Rfl: 2 .  metFORMIN (GLUCOPHAGE XR) 500 MG 24 hr tablet, Take 2 tablets (1,000 mg total) by mouth daily with breakfast. With meals, Disp: 60 tablet, Rfl: 3 .  montelukast (SINGULAIR) 10 MG tablet, Take 1 tablet (10 mg total) by mouth at bedtime., Disp: 90 tablet, Rfl: 3 .  NARCAN 4 MG/0.1ML LIQD nasal spray kit, ADMINISTER A SINGLE SPRAY IN ONE NOSTRIL UPON SIGNS OF OPIOID OVERDOSE. CALL 911. REPEAT AFTER 3 MINUTES IF NO RESPONSE., Disp: , Rfl: 99 .  Nutritional Supplements (GLUCERNA ADVANCE SHAKE) LIQD, Drink 1-2 shakes per day, Disp: 24 Bottle, Rfl: 12 .  oxyCODONE-acetaminophen (PERCOCET) 10-325 MG tablet, One tablet every  4-6 hours as needed, Disp: 180 tablet, Rfl: 0 .  PROAIR HFA 108 (90 Base) MCG/ACT inhaler, INHALE 2 PUFFS BY MOUTH EVERY 6 HOURS AS NEEDED FOR WHEEZING OR SHORTNESS OF BREATH, Disp: , Rfl: 3 .  prochlorperazine (COMPAZINE) 10 MG tablet, Take 1 tablet by mouth every 6 (six) hours as needed., Disp: , Rfl:  .  ramelteon (ROZEREM) 8 MG tablet, Take 1 tablet (8 mg total) by mouth at bedtime as needed for sleep., Disp: 30 tablet, Rfl: 3 .  sucralfate (CARAFATE) 1 g tablet, Take 1 tablet (1 g total) by mouth 4 (four) times daily -  with meals and at bedtime., Disp: 120 tablet, Rfl: 4 .  Tiotropium Bromide-Olodaterol (STIOLTO RESPIMAT) 2.5-2.5 MCG/ACT AERS, Inhale 2 puffs into the lungs daily., Disp: 1 Inhaler, Rfl: 12 .  zaleplon (SONATA) 10 MG capsule, TAKE 1 CAPSULE BY MOUTH IN THE EVENING. MAY TAKE SECOND CAPSULE AFTER 2 HOURS IF NEEDED, Disp: , Rfl: 3  Review of Systems  Constitutional: Negative.   HENT: Negative.   Eyes: Negative.   Respiratory: Negative.   Cardiovascular: Negative.   Musculoskeletal: Positive for back pain.  Allergic/Immunologic: Negative.     Social History   Tobacco Use  . Smoking status: Former Smoker    Packs/day: 0.25    Years: 45.00    Pack years: 11.25    Types: Cigarettes    Last attempt to quit: 03/17/2016    Years since quitting: 1.6  . Smokeless tobacco: Never Used  . Tobacco comment: previously smoked 2 1/2 ppd for 30 years.   Substance Use Topics  . Alcohol use: No    Alcohol/week: 0.0 standard drinks   Objective:   BP 116/82 (BP Location: Left Arm, Patient Position: Sitting, Cuff Size: Normal)   Pulse 100   Temp 99 F (37.2 C) (Oral)   Wt 161 lb 9.6 oz (73.3 kg)   SpO2 95%   BMI 33.77 kg/m  Vitals:   11/12/17 1125  BP: 116/82  Pulse: 100  Temp: 99 F (37.2 C)  TempSrc: Oral  SpO2: 95%  Weight: 161 lb 9.6 oz (73.3 kg)     Physical Exam  Constitutional: She is oriented to person, place, and time. She appears well-developed and  well-nourished.  Cardiovascular: Normal rate and regular rhythm.  Pulmonary/Chest: Effort normal.  Musculoskeletal:  Pain in left hip and knee. Exam difficult due to pt being in wheelchair. No obvious rotation of hip. Left knee slightly tender to palpation but no instability of knee joint.   Neurological: She is alert and oriented to person, place, and time.  Skin: Skin is warm and dry.  Psychiatric: She has a normal mood and affect. Her behavior is normal.        Assessment & Plan:  1. Fall, initial encounter  - DG HIP UNILAT WITH PELVIS 2-3 VIEWS LEFT; Future - DG FEMUR MIN 2 VIEWS LEFT; Future - DG Knee Complete 4 Views Left; Future  2. Left hip pain  - DG HIP UNILAT WITH PELVIS 2-3 VIEWS LEFT; Future - DG FEMUR MIN 2 VIEWS LEFT; Future - DG Knee Complete 4 Views Left; Future  3. Left leg pain  - DG HIP UNILAT WITH PELVIS 2-3 VIEWS LEFT; Future - DG FEMUR MIN 2 VIEWS LEFT; Future - DG Knee Complete 4 Views Left; Future  Return if symptoms worsen or fail to improve.  The entirety of the information documented in the History of Present Illness, Review of Systems and Physical Exam were personally obtained by me. Portions of this information were initially documented by Hurman Horn, CMA and reviewed by me for thoroughness and accuracy.           Trinna Post, PA-C  [facility]

## 2017-11-12 NOTE — Patient Instructions (Signed)

## 2017-11-13 ENCOUNTER — Ambulatory Visit: Payer: Medicare Other

## 2017-11-13 ENCOUNTER — Telehealth: Payer: Self-pay

## 2017-11-13 NOTE — Telephone Encounter (Signed)
-----   Message from Trinna Post, [place] sent at 11/13/2017  8:14 AM EDT ----- No fractures on any xrays.

## 2017-11-13 NOTE — Telephone Encounter (Signed)
Patient advised as below.  

## 2017-11-17 ENCOUNTER — Ambulatory Visit: Payer: Medicare Other

## 2017-11-18 ENCOUNTER — Telehealth: Payer: Self-pay | Admitting: Family Medicine

## 2017-11-18 MED ORDER — GABAPENTIN 300 MG PO CAPS: 300.0000 mg | ORAL_CAPSULE | Freq: Three times a day (TID) | ORAL | 1 refills | Status: DC

## 2017-11-18 NOTE — Telephone Encounter (Signed)
That's fine, she can take one three times a day. Will change directions on prescription when she gets it refilled.

## 2017-11-18 NOTE — Telephone Encounter (Signed)
Pt stated she takes gabapentin (NEURONTIN) 300 MG capsule twice a day and it seems to ware off mid day. Pt is requesting to change the directions and start taking it 3 times a day. Please advise. Thanks TNP

## 2017-11-18 NOTE — Telephone Encounter (Signed)
Patient advised as directed below.  Thanks,  -Joseline 

## 2017-11-19 ENCOUNTER — Ambulatory Visit: Payer: Medicare Other

## 2017-11-20 ENCOUNTER — Encounter: Payer: Medicare Other | Admitting: Oncology

## 2017-11-20 ENCOUNTER — Telehealth: Payer: Self-pay

## 2017-11-20 ENCOUNTER — Telehealth: Payer: Self-pay | Admitting: *Deleted

## 2017-11-20 ENCOUNTER — Ambulatory Visit (INDEPENDENT_AMBULATORY_CARE_PROVIDER_SITE_OTHER): Payer: Medicare Other

## 2017-11-20 VITALS — BP 108/60 | HR 88 | Temp 98.9°F | Ht <= 58 in | Wt 166.2 lb

## 2017-11-20 DIAGNOSIS — J449 Chronic obstructive pulmonary disease, unspecified: Secondary | ICD-10-CM

## 2017-11-20 DIAGNOSIS — M25559 Pain in unspecified hip: Secondary | ICD-10-CM

## 2017-11-20 DIAGNOSIS — I10 Essential (primary) hypertension: Secondary | ICD-10-CM | POA: Diagnosis not present

## 2017-11-20 DIAGNOSIS — H524 Presbyopia: Secondary | ICD-10-CM

## 2017-11-20 DIAGNOSIS — Z0000 Encounter for general adult medical examination without abnormal findings: Secondary | ICD-10-CM

## 2017-11-20 DIAGNOSIS — M545 Low back pain, unspecified: Secondary | ICD-10-CM

## 2017-11-20 DIAGNOSIS — M546 Pain in thoracic spine: Secondary | ICD-10-CM

## 2017-11-20 DIAGNOSIS — M25561 Pain in right knee: Secondary | ICD-10-CM

## 2017-11-20 DIAGNOSIS — E119 Type 2 diabetes mellitus without complications: Secondary | ICD-10-CM

## 2017-11-20 DIAGNOSIS — Z1231 Encounter for screening mammogram for malignant neoplasm of breast: Secondary | ICD-10-CM | POA: Diagnosis not present

## 2017-11-20 DIAGNOSIS — R531 Weakness: Secondary | ICD-10-CM

## 2017-11-20 DIAGNOSIS — G8929 Other chronic pain: Secondary | ICD-10-CM

## 2017-11-20 NOTE — Telephone Encounter (Signed)
Pt was seen in office today for her AWV. Pt stated that her cane is getting unsteady and she would like to get a rollator instead. Pt stated that Dr Caryn Section sent in the order for the cane previously and would like him to do the same thing. Ordered added. Please advise, thank you. -MM

## 2017-11-20 NOTE — Progress Notes (Signed)
Subjective:   Yvette Riley is a 62 y.o. female who presents for an Initial Medicare Annual Wellness Visit.  Review of Systems    N/A  Cardiac Risk Factors include: diabetes mellitus;obesity (BMI >30kg/m2);hypertension     Objective:    Today's Vitals   11/20/17 1050 11/20/17 1055  BP: 108/60   Pulse: 88   Temp: 98.9 F (37.2 C)   TempSrc: Oral   Weight: 166 lb 3.2 oz (75.4 kg)   Height: '4\' 10"'$  (1.473 m)   PainSc: 0-No pain 0-No pain   Body mass index is 34.74 kg/m.  Advanced Directives 11/20/2017 11/03/2017 10/01/2017 07/30/2017 04/02/2017 03/31/2017 02/03/2017  Does Patient Have a Medical Advance Directive? No No No No No No No  Type of Advance Directive - - - - - - -  Does patient want to make changes to medical advance directive? - - - - - - -  Copy of [place] in Chart? - - - - - - -  Would patient like information on creating a medical advance directive? No - Patient declined No - Patient declined No - Patient declined No - Patient declined No - Patient declined No - Patient declined -    Current Medications (verified) Outpatient Encounter Medications as of 11/20/2017  Medication Sig  . albuterol (PROVENTIL) (2.5 MG/3ML) 0.083% nebulizer solution Take 3 mLs (2.5 mg total) by nebulization every 6 (six) hours as needed for wheezing or shortness of breath.  [name] aspirin EC 81 MG tablet Take 81 mg by mouth daily.   . benzonatate (TESSALON PERLES) 100 MG capsule Take 1 capsule (100 mg total) by mouth 3 (three) times daily as needed for cough.  . clonazePAM (KLONOPIN) 0.5 MG tablet Take 1 tablet (0.5 mg total) by mouth 2 (two) times daily as needed.  . ezetimibe (ZETIA) 10 MG tablet Take 1 tablet (10 mg total) by mouth daily.  [name] gabapentin (NEURONTIN) 300 MG capsule Take 1 capsule (300 mg total) by mouth 3 (three) times daily.  [name] glucose blood test strip Contour test strip. Use to check blood sugar once a day for for type 2 diabetes (E11.9)  . Lancets MISC Use  to check blood sugar once daily for type 2 diabetes E11.9  . meloxicam (MOBIC) 15 MG tablet Take 1 tablet (15 mg total) by mouth daily as needed for pain.  . metFORMIN (GLUCOPHAGE XR) 500 MG 24 hr tablet Take 2 tablets (1,000 mg total) by mouth daily with breakfast. With meals  . montelukast (SINGULAIR) 10 MG tablet Take 1 tablet (10 mg total) by mouth at bedtime.  [name] NARCAN 4 MG/0.1ML LIQD nasal spray kit ADMINISTER A SINGLE SPRAY IN ONE NOSTRIL UPON SIGNS OF OPIOID OVERDOSE. CALL 911. REPEAT AFTER 3 MINUTES IF NO RESPONSE.  [name] Nutritional Supplements (GLUCERNA ADVANCE SHAKE) LIQD Drink 1-2 shakes per day  . oxyCODONE-acetaminophen (PERCOCET) 10-325 MG tablet One tablet every 4-6 hours as needed  . PROAIR HFA 108 (90 Base) MCG/ACT inhaler INHALE 2 PUFFS BY MOUTH EVERY 6 HOURS AS NEEDED FOR WHEEZING OR SHORTNESS OF BREATH  . prochlorperazine (COMPAZINE) 10 MG tablet Take 1 tablet by mouth every 6 (six) hours as needed.  . ramelteon (ROZEREM) 8 MG tablet Take 1 tablet (8 mg total) by mouth at bedtime as needed for sleep.  . sucralfate (CARAFATE) 1 g tablet Take 1 tablet (1 g total) by mouth 4 (four) times daily -  with meals and at bedtime.  . Tiotropium Bromide-Olodaterol (STIOLTO  RESPIMAT) 2.5-2.5 MCG/ACT AERS Inhale 2 puffs into the lungs daily.  . zaleplon (SONATA) 10 MG capsule TAKE 1 CAPSULE BY MOUTH IN THE EVENING. MAY TAKE SECOND CAPSULE AFTER 2 HOURS IF NEEDED   No facility-administered encounter medications on file as of 11/20/2017.     Allergies (verified) Codeine; Crestor [rosuvastatin]; Other; Penicillins; Yellow jacket venom [bee venom]; Gemfibrozil; Statins; Trazodone and nefazodone; and Lipitor [atorvastatin]   History: Past Medical History:  Diagnosis Date  . Allergy   . Anemia   . Anxiety   . Arthritis   . Asthma   . Blood transfusion without reported diagnosis   . C. difficile diarrhea 04/07/2016  . CAP (community acquired pneumonia) 03/21/2015  . Combined forms of  age-related cataract of both eyes 05/06/2016  . COPD (chronic obstructive pulmonary disease) ([place])   . Diabetes mellitus without complication ([place])   . Displacement of lumbar intervertebral disc without myelopathy   . Emphysema of lung ([place])   . GERD (gastroesophageal reflux disease)   . Glaucoma   . History of chicken pox   . Hypertension   . Pneumonia 03/29/2015  . Shortness of breath   . Small cell lung cancer [facility])    Past Surgical History:  Procedure Laterality Date  . CESAREAN SECTION    . TUBAL LIGATION     Family History  Problem Relation Age of Onset  . Heart failure Mother   . Heart disease Mother   . Stroke Mother   . Heart disease Brother   . COPD Brother   . Cancer Maternal Aunt        Breast Cancer   Social History   Socioeconomic History  . Marital status: Widowed    Spouse name: Not on file  . Number of children: 1  . Years of education: Not on file  . Highest education level: 10th grade  Occupational History  . Occupation: disable  Social Needs  . Financial resource strain: Not hard at all  . Food insecurity:    Worry: Sometimes true    Inability: Sometimes true  . Transportation needs:    Medical: No    Non-medical: Yes  Tobacco Use  . Smoking status: Former Smoker    Packs/day: 0.25    Years: 45.00    Pack years: 11.25    Types: Cigarettes    Last attempt to quit: 03/17/2016    Years since quitting: 1.6  . Smokeless tobacco: Never Used  . Tobacco comment: pt states she quit in 2016-2017  Substance and Sexual Activity  . Alcohol use: No    Alcohol/week: 0.0 standard drinks  . Drug use: No  . Sexual activity: Not on file  Lifestyle  . Physical activity:    Days per week: 0 days    Minutes per session: 0 min  . Stress: Very much  Relationships  . Social connections:    Talks on phone: Patient refused    Gets together: Patient refused    Attends religious service: Patient refused    Active member of club or organization: Patient refused     Attends meetings of clubs or organizations: Patient refused    Relationship status: Patient refused  Other Topics Concern  . Not on file  Social History Narrative  . Not on file    Tobacco Counseling Counseling given: Not Answered Comment: pt states she quit in 2016-2017   Clinical Intake:  Pre-visit preparation completed: Yes  Pain : No/denies pain Pain Score: 0-No pain  Nutrition Risk Assessment:  Has the patient had any N/V/D within the last 2 months?  No  Does the patient have any non-healing wounds?  No  Has the patient had any unintentional weight loss or weight gain?  No   Diabetes:  Is the patient diabetic?  Yes  If diabetic, was a CBG obtained today?  No  Did the patient bring in their glucometer from home?  No  How often do you monitor your CBG's? Once daily.   Financial Strains and Diabetes Management:  Are you having any financial strains with the device, your supplies or your medication? No .  Does the patient want to be seen by Chronic Care Management for management of their diabetes?  No  Would the patient like to be referred to a Nutritionist or for Diabetic Management?  No   Diabetic Exams:  Diabetic Eye Exam: Completed 01/10/16. Overdue for diabetic eye exam. Pt has been advised about the importance in completing this exam. Pt to f/u with Dr Gloriann Loan on this.   Diabetic Foot Exam: Completed 08/11/06. Pt has been advised about the importance in completing this exam. Note made to f/u on this at next OV.    How often do you need to have someone help you when you read instructions, pamphlets, or other written materials from your doctor or pharmacy?: 1 - Never  Interpreter Needed?: No  Information entered by :: [facility], LPN   Activities of Daily Living In your present state of health, do you have any difficulty performing the following activities: 11/20/2017  Hearing? N  Vision? Y  Comment Needs a new prescription, pt to schedule an apt this  year.   Difficulty concentrating or making decisions? Y  Walking or climbing stairs? Y  Comment Due to back and leg pains.   Dressing or bathing? N  Doing errands, shopping? N  Preparing Food and eating ? N  Using the Toilet? N  In the past six months, have you accidently leaked urine? N  Do you have problems with loss of bowel control? N  Managing your Medications? N  Managing your Finances? N  Housekeeping or managing your Housekeeping? N  Some recent data might be hidden     Immunizations and Health Maintenance Immunization History  Administered Date(s) Administered  . Influenza,inj,Quad PF,6+ Mos 12/13/2014, 11/23/2016, 10/13/2017  . Influenza-Unspecified 01/28/2014  . Pneumococcal Polysaccharide-23 02/24/2014   Health Maintenance Due  Topic Date Due  . Hepatitis C Screening  09-Dec-1955  . FOOT EXAM  10/01/1966  . OPHTHALMOLOGY EXAM  10/01/1966  . TETANUS/TDAP  10/01/1975  . PAP SMEAR  09/30/1977  . COLONOSCOPY  10/01/2006  . MAMMOGRAM  01/02/2015  . URINE MICROALBUMIN  11/05/2017    Patient Care Team: Birdie Sons, MD as PCP - General (Family Medicine) Lorelee Cover., MD (Ophthalmology)  Indicate any recent Medical Services you may have received from other than [facility] providers in the past year (date may be approximate).     Assessment:   This is a routine wellness examination for [place].  Hearing/Vision screen No exam data present  Dietary issues and exercise activities discussed: Current Exercise Habits: The patient does not participate in regular exercise at present, Exercise limited by: orthopedic condition(s);respiratory conditions(s)  Goals    . Prevent falls     Recommend to remove any items from the home that may cause slips or trips.       Depression Screen PHQ 2/9 Scores 11/20/2017 10/03/2017 10/01/2016 05/29/2016 12/13/2014  PHQ - 2 Score 0 1 0 1 0  PHQ- 9 Score 3 5 - 7 -    Fall Risk Fall Risk  11/20/2017 12/04/2016 10/01/2016 08/13/2016    Falls in the past year? Yes No Yes No  Number falls in past yr: 2 or more - 2 or more -  Injury with Fall? No - No -  Risk Factor Category  High Fall Risk - High Fall Risk -  Risk for fall due to : Impaired mobility;History of fall(s) - History of fall(s);Impaired balance/gait -  Follow up Falls prevention discussed - Falls evaluation completed -   FALL RISK PREVENTION PERTAINING TO THE HOME:  Any stairs in or around the home WITH handrails? No  Home free of loose throw rugs in walkways, pet beds, electrical cords, etc? Yes  Adequate lighting in your home to reduce risk of falls? Yes   ASSISTIVE DEVICES UTILIZED TO PREVENT FALLS:  Life alert? Yes  Use of a cane, walker or w/c? Yes  Grab bars in the bathroom? No  Shower chair or bench in shower? No  Elevated toilet seat or a handicapped toilet? No    TIMED UP AND GO:  Was the test performed? No .     Cognitive Function:     6CIT Screen 11/20/2017  What Year? 0 points  What month? 0 points  What time? 0 points  Count back from 20 0 points  Months in reverse 0 points  Repeat phrase 0 points  Total Score 0    Screening Tests Health Maintenance  Topic Date Due  . Hepatitis C Screening  Jan 22, 1956  . FOOT EXAM  10/01/1966  . OPHTHALMOLOGY EXAM  10/01/1966  . TETANUS/TDAP  10/01/1975  . PAP SMEAR  09/30/1977  . COLONOSCOPY  10/01/2006  . MAMMOGRAM  01/02/2015  . URINE MICROALBUMIN  11/05/2017  . HEMOGLOBIN A1C  02/08/2018  . INFLUENZA VACCINE  Completed  . PNEUMOCOCCAL POLYSACCHARIDE VACCINE AGE 65-64 HIGH RISK  Completed  . HIV Screening  Completed    Qualifies for Shingles Vaccine? Yes . Due for Shingrix. Education has been provided regarding the importance of this vaccine. Pt has been advised to call insurance company to determine out of pocket expense. Advised may also receive vaccine at local pharmacy or Health Dept. Verbalized acceptance and understanding.  Tdap: Although this vaccine is not a covered  service during a Wellness Exam, does the patient still wish to receive this vaccine today?  No .  Education has been provided regarding the importance of this vaccine. Advised may receive this vaccine at local pharmacy or Health Dept. Aware to provide a copy of the vaccination record if obtained from local pharmacy or Health Dept. Verbalized acceptance and understanding.  Flu Vaccine: Up to date  Cancer Screenings:  Colorectal Screening: Pt declines referral today.   Mammogram: Completed 09/27/14. Repeat every year; No longer required. Ordered today. Pt advised to call to schedule appt.    Lung Cancer Screening: (Low Dose CT Chest recommended if Age 40-80 years, 30 pack-year currently smoking OR have quit w/in 15years.) does qualify however is up to date as of 08/27/17.  Lung Cancer Screening Referral: An Epic message has been sent to Burgess Estelle, RN (Oncology Nurse Navigator) regarding the possible need for this exam. Raquel Sarna will review the patient's chart to determine if the patient truly qualifies for the exam. If the patient qualifies, Raquel Sarna will order the Low Dose CT of the chest to facilitate the scheduling of this  exam.  Additional Screening:  Hepatitis C Screening: does qualify; however pt would like to have this added to BW orders with PCP.  Vision Screening: Recommended annual ophthalmology exams for early detection of glaucoma and other disorders of the eye.  Dental Screening: Recommended annual dental exams for proper oral hygiene  Community Resource Referral:  CRR required this visit? Yes.      Plan:  I have personally reviewed and addressed the Medicare Annual Wellness questionnaire and have noted the following in the patient's chart:  A. Medical and social history B. Use of alcohol, tobacco or illicit drugs  C. Current medications and supplements D. Functional ability and status E.  Nutritional status F.  Physical activity G. Advance directives H. List of other  physicians I.  Hospitalizations, surgeries, and ER visits in previous 12 months J.  [place] such as hearing and vision if needed, cognitive and depression L. Referrals and appointments - none  In addition, I have reviewed and discussed with patient certain preventive protocols, quality metrics, and best practice recommendations. A written personalized care plan for preventive services as well as general preventive health recommendations were provided to patient.  See attached scanned questionnaire for additional information.   Signed,  Fabio Neighbors, LPN Nurse Health Advisor   Nurse Recommendations: Pt needs a diabetic foot exam, pap smear and Hep C lab order at next OV. Pt states she would like all BW done at the same visit. Pt plans to set up an eye exam this year with Dr Gloriann Loan. Pt declined the tetanus vaccine and colonoscopy referral today. Referral sent for mammogram, C3 financial assistance, CCM and a nutritionist.

## 2017-11-20 NOTE — Telephone Encounter (Signed)
Patient called and stated that she has been having a bad cough for awhile and said that she thinks  it may be related to her having cancer. Patient asked if she can RTC to be seen by MD.  Per Dr. Grayland Ormond 11/20/17 staff message patient can be seen in [facility]. A message was left on patient's vmail making her aware that she may be able to be seen today or  tomorrow in [facility].

## 2017-11-20 NOTE — Patient Instructions (Addendum)
Yvette Riley , Thank you for taking time to come for your Medicare Wellness Visit. I appreciate your ongoing commitment to your health goals. Please review the following plan we discussed and let me know if I can assist you in the future.   Screening recommendations/referrals: Colonoscopy: Pt declines today.  Mammogram: Ordered today, pt to set up apt. Bone Density: N/A Recommended yearly ophthalmology/optometry visit for glaucoma screening and checkup Recommended yearly dental visit for hygiene and checkup  Vaccinations: Influenza vaccine: Up to date Pneumococcal vaccine: N/A Tdap vaccine: Pt declines today.  Shingles vaccine: Pt declines today.     Advanced directives: Advance directive discussed with you today. Even though you declined this today please call our office should you change your mind and we can give you the proper paperwork for you to fill out.  Conditions/risks identified: Obesity; Fall risk - Recommend to remove any items from the home that may cause slips or trips.  Next appointment: 12/09/17 with Dr Caryn Section.   Preventive Care 40-64 Years, Female Preventive care refers to lifestyle choices and visits with your health care provider that can promote health and wellness. What does preventive care include?  A yearly physical exam. This is also called an annual well check.  Dental exams once or twice a year.  Routine eye exams. Ask your health care provider how often you should have your eyes checked.  Personal lifestyle choices, including:  Daily care of your teeth and gums.  Regular physical activity.  Eating a healthy diet.  Avoiding tobacco and drug use.  Limiting alcohol use.  Practicing safe sex.  Taking low-dose aspirin daily starting at age 27.  Taking vitamin and mineral supplements as recommended by your health care provider. What happens during an annual well check? The services and screenings done by your health care provider during your  annual well check will depend on your age, overall health, lifestyle risk factors, and family history of disease. Counseling  Your health care provider may ask you questions about your:  Alcohol use.  Tobacco use.  Drug use.  Emotional well-being.  Home and relationship well-being.  Sexual activity.  Eating habits.  Work and work Statistician.  Method of birth control.  Menstrual cycle.  Pregnancy history. Screening  You may have the following tests or measurements:  Height, weight, and BMI.  Blood pressure.  Lipid and cholesterol levels. These may be checked every 5 years, or more frequently if you are over 45 years old.  Skin check.  Lung cancer screening. You may have this screening every year starting at age 69 if you have a 30-pack-year history of smoking and currently smoke or have quit within the past 15 years.  Fecal occult blood test (FOBT) of the stool. You may have this test every year starting at age 87.  Flexible sigmoidoscopy or colonoscopy. You may have a sigmoidoscopy every 5 years or a colonoscopy every 10 years starting at age 33.  Hepatitis C blood test.  Hepatitis B blood test.  Sexually transmitted disease (STD) testing.  Diabetes screening. This is done by checking your blood sugar (glucose) after you have not eaten for a while (fasting). You may have this done every 1-3 years.  Mammogram. This may be done every 1-2 years. Talk to your health care provider about when you should start having regular mammograms. This may depend on whether you have a family history of breast cancer.  BRCA-related cancer screening. This may be done if you have a family history  of breast, ovarian, tubal, or peritoneal cancers.  Pelvic exam and Pap test. This may be done every 3 years starting at age 61. Starting at age 13, this may be done every 5 years if you have a Pap test in combination with an HPV test.  Bone density scan. This is done to screen for  osteoporosis. You may have this scan if you are at high risk for osteoporosis. Discuss your test results, treatment options, and if necessary, the need for more tests with your health care provider. Vaccines  Your health care provider may recommend certain vaccines, such as:  Influenza vaccine. This is recommended every year.  Tetanus, diphtheria, and acellular pertussis (Tdap, Td) vaccine. You may need a Td booster every 10 years.  Zoster vaccine. You may need this after age 43.  Pneumococcal 13-valent conjugate (PCV13) vaccine. You may need this if you have certain conditions and were not previously vaccinated.  Pneumococcal polysaccharide (PPSV23) vaccine. You may need one or two doses if you smoke cigarettes or if you have certain conditions. Talk to your health care provider about which screenings and vaccines you need and how often you need them. This information is not intended to replace advice given to you by your health care provider. Make sure you discuss any questions you have with your health care provider. Document Released: 02/10/2015 Document Revised: 10/04/2015 Document Reviewed: 11/15/2014 Elsevier Interactive Patient Education  2017 [place] Prevention in the Home Falls can cause injuries. They can happen to people of all ages. There are many things you can do to make your home safe and to help prevent falls. What can I do on the outside of my home?  Regularly fix the edges of walkways and driveways and fix any cracks.  Remove anything that might make you trip as you walk through a door, such as a raised step or threshold.  Trim any bushes or trees on the path to your home.  Use bright outdoor lighting.  Clear any walking paths of anything that might make someone trip, such as rocks or tools.  Regularly check to see if handrails are loose or broken. Make sure that both sides of any steps have handrails.  Any raised decks and porches should have  guardrails on the edges.  Have any leaves, snow, or ice cleared regularly.  Use sand or salt on walking paths during winter.  Clean up any spills in your garage right away. This includes oil or grease spills. What can I do in the bathroom?  Use night lights.  Install grab bars by the toilet and in the tub and shower. Do not use towel bars as grab bars.  Use non-skid mats or decals in the tub or shower.  If you need to sit down in the shower, use a plastic, non-slip stool.  Keep the floor dry. Clean up any water that spills on the floor as soon as it happens.  Remove soap buildup in the tub or shower regularly.  Attach bath mats securely with double-sided non-slip rug tape.  Do not have throw rugs and other things on the floor that can make you trip. What can I do in the bedroom?  Use night lights.  Make sure that you have a light by your bed that is easy to reach.  Do not use any sheets or blankets that are too big for your bed. They should not hang down onto the floor.  Have a firm chair that  has side arms. You can use this for support while you get dressed.  Do not have throw rugs and other things on the floor that can make you trip. What can I do in the kitchen?  Clean up any spills right away.  Avoid walking on wet floors.  Keep items that you use a lot in easy-to-reach places.  If you need to reach something above you, use a strong step stool that has a grab bar.  Keep electrical cords out of the way.  Do not use floor polish or wax that makes floors slippery. If you must use wax, use non-skid floor wax.  Do not have throw rugs and other things on the floor that can make you trip. What can I do with my stairs?  Do not leave any items on the stairs.  Make sure that there are handrails on both sides of the stairs and use them. Fix handrails that are broken or loose. Make sure that handrails are as long as the stairways.  Check any carpeting to make sure that  it is firmly attached to the stairs. Fix any carpet that is loose or worn.  Avoid having throw rugs at the top or bottom of the stairs. If you do have throw rugs, attach them to the floor with carpet tape.  Make sure that you have a light switch at the top of the stairs and the bottom of the stairs. If you do not have them, ask someone to add them for you. What else can I do to help prevent falls?  Wear shoes that:  Do not have high heels.  Have rubber bottoms.  Are comfortable and fit you well.  Are closed at the toe. Do not wear sandals.  If you use a stepladder:  Make sure that it is fully opened. Do not climb a closed stepladder.  Make sure that both sides of the stepladder are locked into place.  Ask someone to hold it for you, if possible.  Clearly mark and make sure that you can see:  Any grab bars or handrails.  First and last steps.  Where the edge of each step is.  Use tools that help you move around (mobility aids) if they are needed. These include:  Canes.  Walkers.  Scooters.  Crutches.  Turn on the lights when you go into a dark area. Replace any light bulbs as soon as they burn out.  Set up your furniture so you have a clear path. Avoid moving your furniture around.  If any of your floors are uneven, fix them.  If there are any pets around you, be aware of where they are.  Review your medicines with your doctor. Some medicines can make you feel dizzy. This can increase your chance of falling. Ask your doctor what other things that you can do to help prevent falls. This information is not intended to replace advice given to you by your health care provider. Make sure you discuss any questions you have with your health care provider. Document Released: 11/10/2008 Document Revised: 06/22/2015 Document Reviewed: 02/18/2014 Elsevier Interactive Patient Education  2017 [employer].

## 2017-11-21 ENCOUNTER — Ambulatory Visit: Payer: Self-pay | Admitting: *Deleted

## 2017-11-21 DIAGNOSIS — I10 Essential (primary) hypertension: Secondary | ICD-10-CM

## 2017-11-21 NOTE — Chronic Care Management (AMB) (Signed)
  Chronic Care Management   Note  11/21/2017 Name: DANAYA GEDDIS MRN: [id number] DOB: 21-Sep-1955  Telephone outreach to Ms. [name] in response to a referral sent for CCM services for HTN disease management and care coordination. I spoke briefly with Ms. Erby who said she was out having lunch and asked if I could call her next week.   Plan: Telephone outreach week of 11/24/17.   [facility] [place] Management  [phone]

## 2017-11-21 NOTE — Telephone Encounter (Signed)
Order written for her to pick up.

## 2017-11-21 NOTE — Telephone Encounter (Signed)
Patient advised. Order placed up front for pick up.

## 2017-11-24 ENCOUNTER — Telehealth: Payer: Self-pay | Admitting: Family Medicine

## 2017-11-24 ENCOUNTER — Ambulatory Visit: Payer: Self-pay | Admitting: Pharmacist

## 2017-11-24 ENCOUNTER — Ambulatory Visit: Payer: Medicare Other

## 2017-11-24 ENCOUNTER — Inpatient Hospital Stay: Payer: Medicare Other | Admitting: Oncology

## 2017-11-24 VITALS — BP 149/87 | HR 94

## 2017-11-24 DIAGNOSIS — E119 Type 2 diabetes mellitus without complications: Secondary | ICD-10-CM

## 2017-11-24 DIAGNOSIS — M6281 Muscle weakness (generalized): Secondary | ICD-10-CM

## 2017-11-24 DIAGNOSIS — R262 Difficulty in walking, not elsewhere classified: Secondary | ICD-10-CM

## 2017-11-24 DIAGNOSIS — R2681 Unsteadiness on feet: Secondary | ICD-10-CM

## 2017-11-24 DIAGNOSIS — I10 Essential (primary) hypertension: Secondary | ICD-10-CM

## 2017-11-24 NOTE — Chronic Care Management (AMB) (Signed)
  Chronic Care Management   Note  11/24/2017 Name: ONELL MCMATH MRN: [id number] DOB: 02-04-1955    Care Management Note    62 y.o. year old female referred to Chronic Care Management by Erenest Rasher, LPN for diabetes and HTN management and medication assistance. Chronic conditions include HTN, COPD, type 2 DM, small cell lung cancer, tobacco use, obesity. Last office visit with Birdie Sons, MD was 11/12/17.   Was unable to reach patient via telephone today and have left HIPAA compliant voicemail asking patient to return my call. (unsuccessful outreach #2).  Plan: Will follow-up within 3-5 business days via telephone.   Ruben Reason, PharmD Clinical Pharmacist [place] [phone]

## 2017-11-24 NOTE — Therapy (Signed)
[place] MAIN [facility] SERVICES [address] [place], [place], [address] Phone: [phone]   Fax:  [phone]  Physical Therapy Treatment  Patient Details  Name: Yvette Riley MRN: [id number] Date of Birth: [birth year] Referring Provider (PT): Lelon Huh   Encounter Date: 11/24/2017  PT End of Session - 11/24/17 0805    Visit Number  3    Number of Visits  13    Date for PT Re-Evaluation  12/15/17    Authorization Type  Progress note: 3/10    Authorization Time Period  Last goals: 11/03/17    PT Start Time  0801    PT Stop Time  0845    PT Time Calculation (min)  44 min    Equipment Utilized During Treatment  Gait belt    Activity Tolerance  Patient tolerated treatment well    Behavior During Therapy  [facility] for tasks assessed/performed       Past Medical History:  Diagnosis Date  . Allergy   . Anemia   . Anxiety   . Arthritis   . Asthma   . Blood transfusion without reported diagnosis   . C. difficile diarrhea 04/07/2016  . CAP (community acquired pneumonia) 03/21/2015  . Combined forms of age-related cataract of both eyes 05/06/2016  . COPD (chronic obstructive pulmonary disease) ([place])   . Diabetes mellitus without complication ([place])   . Displacement of lumbar intervertebral disc without myelopathy   . Emphysema of lung ([place])   . GERD (gastroesophageal reflux disease)   . Glaucoma   . History of chicken pox   . Hypertension   . Pneumonia 03/29/2015  . Shortness of breath   . Small cell lung cancer [facility])     Past Surgical History:  Procedure Laterality Date  . CESAREAN SECTION    . TUBAL LIGATION      Vitals:   11/24/17 0804  BP: (!) 149/87  Pulse: 94  SpO2: 99%    Subjective Assessment - 11/24/17 0804    Subjective  Pt reports that she is doing alright. She continues to have L hip pain from her fall. She has continued to perform her HEP as able. No specific questions or concerns currently.     Pertinent History   Pt reports that in 2000 she fell and hurt her back. The pain has persisted since that time and it radiates down her LLE to her calf. She has started having some pain in her RLE. Pt states that she has been very weak lately and falling frequently. She reports that she falls "at least once/week." She also complains of "the room tilting" and it "pretty much stays with me." No aggravation of symptoms with head turns or looking up. She has a walker and a single point cane. She uses the cane for shorter distances and the walker for longer distances. Pt reports bilateral LE buckling. She denies syncope but does endorse occasional lightheadedness. She does have frequent global headaches for the last 6 months. She gets the headaches at least once/week that last approximately 45 min to 1 hour. She does have lung cancer and had mri brain in November 2018 without signs of metastatic disease to the brain. She is currently in remission. She reports bilateral LE numbness/tingling as well as fasciculations in RUE. Pt reports that a couple weeks ago she fell and struck her head on the concrete. She had plain films per pt report without signs of acute injury.     Limitations  Walking    Diagnostic tests  MRI without signs of metastatic disease in brain    Patient Stated Goals  Improve balance and reduce falls    Currently in Pain?  Yes    Pain Score  7     Pain Location  Hip    Pain Orientation  Left    Pain Descriptors / Indicators  Sharp    Pain Type  Acute pain    Pain Onset  More than a month ago          TREATMENT  Therapeutic Exercise: Standing marches with 2# ankle weight (AW) x 10 bilateral; Standing HS curls with 2# AW x 10 bilateral; Standing hip abduction with 2# AW x 10 bilateral; LAQ #2 x 10 bilateral; SLR 2# x 10 RLE, x 5 LLE discontinued secondary to pain; Seated clams with manual resistance x 10; Seated adductor squeeze with manual resistance x 10; Bridges 2 x 10;  Neuromuscular  Re-education  Standing toe taps to 6" step alternating LE without UE support x 10 bilateral; Standing on A[employer] toe taps to 6" step alternating LE without UE support x 10 bilateral; Alternating step-ups to 6" step without UE support x 10 each; 6" hurdle steps x 10 bilateral;   Pt educated throughout session about proper posture and technique with exercises. Improved exercise technique, movement at target joints, use of target muscles after min to mod verbal, visual, tactile cues.    Pt demonstrates excellent motivation during session today. She requires intermittent rest breaks secondary to fatigue. Some exercises modified or stopped secondary to L hip pain. Pt encouraged to continue HEP and follow-up as scheduled. Pt will continue to benefit from PT services to address deficits in strength, balance, and mobility in order to return to full function at home.                     PT Short Term Goals - 11/03/17 2778      PT SHORT TERM GOAL #1   Title  Pt will be independent with HEP in order to improve strength and balance in order to decrease fall risk and improve function at home and work.    Time  3    Period  Weeks    Status  New    Target Date  11/24/17        PT Long Term Goals - 11/03/17 0919      PT LONG TERM GOAL #1   Title  Pt will improve BERG by at least 3 points in order to demonstrate clinically significant improvement in balance.      Baseline  11/03/17: 43/56     Time  6    Period  Weeks    Status  New    Target Date  12/15/17      PT LONG TERM GOAL #2   Title  Pt will improve ABC by at least 13% in order to demonstrate clinically significant improvement in balance confidence.     Baseline  11/03/17: 40.63%    Time  6    Period  Weeks    Status  New    Target Date  12/15/17      PT LONG TERM GOAL #3   Title  Pt will decrease 5TSTS by at least 3 seconds in order to demonstrate clinically significant improvement in LE strength.    Baseline   11/03/17: 19.8s    Time  6    Period  Weeks  Status  New    Target Date  12/15/17      PT LONG TERM GOAL #4   Title  Pt will decrease TUG to below 14 seconds/decrease 23% in order to demonstrate decreased fall risk.    Baseline  11/03/17: 18.4s    Time  6    Period  Weeks    Status  New    Target Date  12/15/17            Plan - 11/24/17 0805    Clinical Impression Statement  Pt demonstrates excellent motivation during session today. She requires intermittent rest breaks secondary to fatigue. Some exercises modified or stopped secondary to L hip pain. Pt encouraged to continue HEP and follow-up as scheduled. Pt will continue to benefit from PT services to address deficits in strength, balance, and mobility in order to return to full function at home.     Rehab Potential  Fair    PT Frequency  2x / week    PT Duration  6 weeks    PT Treatment/Interventions  ADLs/Self Care Home Management;Aquatic Therapy;Canalith Repostioning;Cryotherapy;Electrical Stimulation;Iontophoresis 4mg /ml Dexamethasone;Moist Heat;Traction;Ultrasound;DME Instruction;Gait training;Stair training;Therapeutic activities;Therapeutic exercise;Balance training;Neuromuscular re-education;Manual techniques;Passive range of motion;Dry needling;Vestibular    PT Next Visit Plan  Review HEP and progress as pt is able    PT Home Exercise Plan  Sit to stand without UE support 3 x 5, semitandem balance alternating forward LE    Consulted and Agree with Plan of Care  Patient       Patient will benefit from skilled therapeutic intervention in order to improve the following deficits and impairments:  Abnormal gait, Decreased balance, Decreased activity tolerance, Decreased strength, Difficulty walking  Visit Diagnosis: Difficulty in walking, not elsewhere classified  Unsteadiness on feet  Muscle weakness (generalized)     Problem List Patient Active Problem List   Diagnosis Date Noted  . Anxiety 07/19/2016  .  Small cell lung cancer, right ([place]) 05/13/2016  . Bilateral presbyopia 05/06/2016  . Combined forms of age-related cataract of both eyes 05/06/2016  . Dry eye syndrome of both lacrimal glands 05/06/2016  . Chemoprophylaxis 04/29/2016  . Severe obesity (BMI 35.0-39.9) with comorbidity ([place]) 04/16/2016  . Elevated d-dimer 04/06/2016  . Weakness generalized 04/06/2016  . Sepsis ([place]) 03/17/2016  . Mass of upper lobe of right lung 03/01/2016  . Coronary atherosclerosis 02/29/2016  . Personal history of tobacco use, presenting hazards to health 02/28/2016  . Allergic rhinitis 09/27/2014  . Asthma 09/27/2014  . Anemia 09/27/2014  . Hyperglyceridemia, pure 09/27/2014  . Glaucoma 09/27/2014  . Hip pain 09/27/2014  . Right knee pain 09/27/2014  . Displacement of lumbar intervertebral disc without myelopathy 09/27/2014  . Thoracic back pain 09/27/2014  . Insomnia 08/11/2014  . COPD (chronic obstructive pulmonary disease) ([place]) 06/12/2014  . Type 2 diabetes mellitus ([place]) 06/12/2014  . Back pain 06/12/2014  . Essential hypertension 03/28/2014  . Smoking greater than 30 pack years 03/28/2014   Phillips Grout PT, DPT, GCS  Dai Mcadams 11/24/2017, 8:43 AM  [place] MAIN [facility] SERVICES [address] [place], [place], [address] Phone: [phone]   Fax:  [phone]  Name: Yvette Riley MRN: [id number] Date of Birth: [birth year]

## 2017-11-24 NOTE — Telephone Encounter (Signed)
FYICorene Riley at [facility] PT needs to finish up with [name] legs before starting PT on her back/hips.  This is per patient.

## 2017-11-26 ENCOUNTER — Telehealth: Payer: Self-pay

## 2017-11-26 ENCOUNTER — Ambulatory Visit: Payer: Medicare Other

## 2017-11-26 DIAGNOSIS — R262 Difficulty in walking, not elsewhere classified: Secondary | ICD-10-CM | POA: Diagnosis not present

## 2017-11-26 DIAGNOSIS — M6281 Muscle weakness (generalized): Secondary | ICD-10-CM | POA: Diagnosis not present

## 2017-11-26 DIAGNOSIS — R2681 Unsteadiness on feet: Secondary | ICD-10-CM | POA: Diagnosis not present

## 2017-11-26 NOTE — Therapy (Signed)
[place] MAIN [facility] SERVICES [address] [place], [place], [address] Phone: [phone]   Fax:  [phone]  Physical Therapy Treatment  Patient Details  Name: Yvette Riley MRN: [id number] Date of Birth: Sep 03, 1955 Referring Provider (PT): Lelon Huh   Encounter Date: 11/26/2017  PT End of Session - 11/26/17 0807    Visit Number  4    Number of Visits  13    Date for PT Re-Evaluation  12/15/17    Authorization Type  Progress note: 4/10    Authorization Time Period  Last goals: 11/03/17    PT Start Time  0758    PT Stop Time  0845    PT Time Calculation (min)  47 min    Equipment Utilized During Treatment  Gait belt    Activity Tolerance  Patient tolerated treatment well    Behavior During Therapy  [facility] for tasks assessed/performed       Past Medical History:  Diagnosis Date  . Allergy   . Anemia   . Anxiety   . Arthritis   . Asthma   . Blood transfusion without reported diagnosis   . C. difficile diarrhea 04/07/2016  . CAP (community acquired pneumonia) 03/21/2015  . Combined forms of age-related cataract of both eyes 05/06/2016  . COPD (chronic obstructive pulmonary disease) ([place])   . Diabetes mellitus without complication ([place])   . Displacement of lumbar intervertebral disc without myelopathy   . Emphysema of lung ([place])   . GERD (gastroesophageal reflux disease)   . Glaucoma   . History of chicken pox   . Hypertension   . Pneumonia 03/29/2015  . Shortness of breath   . Small cell lung cancer [facility])     Past Surgical History:  Procedure Laterality Date  . CESAREAN SECTION    . TUBAL LIGATION      There were no vitals filed for this visit.  Subjective Assessment - 11/26/17 0805    Subjective  Pt complianing of increased low back soreness upon arrival today. Rates pain as 7/10 currently. Pt reports that she was going up some concrete stairs on Tuesday and her LLE buckled and she went down onto her knee. She has been  watching her knee but has not seen any bruising however she continues to have pain. Otherwise no specific questions or concerns    Pertinent History  Pt reports that in 2000 she fell and hurt her back. The pain has persisted since that time and it radiates down her LLE to her calf. She has started having some pain in her RLE. Pt states that she has been very weak lately and falling frequently. She reports that she falls "at least once/week." She also complains of "the room tilting" and it "pretty much stays with me." No aggravation of symptoms with head turns or looking up. She has a walker and a single point cane. She uses the cane for shorter distances and the walker for longer distances. Pt reports bilateral LE buckling. She denies syncope but does endorse occasional lightheadedness. She does have frequent global headaches for the last 6 months. She gets the headaches at least once/week that last approximately 45 min to 1 hour. She does have lung cancer and had mri brain in November 2018 without signs of metastatic disease to the brain. She is currently in remission. She reports bilateral LE numbness/tingling as well as fasciculations in RUE. Pt reports that a couple weeks ago she fell and struck her head  on the concrete. She had plain films per pt report without signs of acute injury.     Limitations  Walking    Diagnostic tests  MRI without signs of metastatic disease in brain    Patient Stated Goals  Improve balance and reduce falls    Currently in Pain?  Yes    Pain Score  7     Pain Location  Back    Pain Orientation  Left;Lower    Pain Descriptors / Indicators  Sore    Pain Type  Chronic pain    Pain Onset  More than a month ago          TREATMENT   Neuromuscular Re-education  NuStep L1 x 6 minutes for warm-up during history (4 minutes unbilled); 1/2 foam roller A/P orientation balance x 60s; 1/2 foam roller tandem balance alternating forward LE x 60s each; Standing on Airex toe  taps to 6" step alternating LE without UE support x 10 bilateral; Airex balance with feet together with ball pass with therapist around body at varying heights from waist to overhead x 10 each direction; 6" forward hurdle steps in staggered stance x 10 bilateral; Airex balance beam tandem gait x 4 lengths; Airex balance beam side stepping x 4 lengths;   Pt educated throughout session about proper posture and technique with exercises. Improved exercise technique, movement at target joints, use of target muscles after min to mod verbal, visual, tactile cues.    Pt demonstrates excellent motivation during session today. Session today focused on balance exercises due to increased low back soreness upon arrival. She requires intermittent rest breaks secondary to fatigue. Pt encouraged to continue HEP and follow-up as scheduled. Pt will continue to benefit from PT services to address deficits in strength, balance, and mobility in order to return to full function at home.                        PT Short Term Goals - 11/03/17 6734      PT SHORT TERM GOAL #1   Title  Pt will be independent with HEP in order to improve strength and balance in order to decrease fall risk and improve function at home and work.    Time  3    Period  Weeks    Status  New    Target Date  11/24/17        PT Long Term Goals - 11/03/17 0919      PT LONG TERM GOAL #1   Title  Pt will improve BERG by at least 3 points in order to demonstrate clinically significant improvement in balance.      Baseline  11/03/17: 43/56     Time  6    Period  Weeks    Status  New    Target Date  12/15/17      PT LONG TERM GOAL #2   Title  Pt will improve ABC by at least 13% in order to demonstrate clinically significant improvement in balance confidence.     Baseline  11/03/17: 40.63%    Time  6    Period  Weeks    Status  New    Target Date  12/15/17      PT LONG TERM GOAL #3   Title  Pt will decrease  5TSTS by at least 3 seconds in order to demonstrate clinically significant improvement in LE strength.    Baseline  11/03/17: 19.8s  Time  6    Period  Weeks    Status  New    Target Date  12/15/17      PT LONG TERM GOAL #4   Title  Pt will decrease TUG to below 14 seconds/decrease 23% in order to demonstrate decreased fall risk.    Baseline  11/03/17: 18.4s    Time  6    Period  Weeks    Status  New    Target Date  12/15/17            Plan - 11/26/17 0807    Clinical Impression Statement  Pt demonstrates excellent motivation during session today. Session today focused on balance exercises due to increased low back soreness upon arrival. She requires intermittent rest breaks secondary to fatigue. Pt encouraged to continue HEP and follow-up as scheduled. Pt will continue to benefit from PT services to address deficits in strength, balance, and mobility in order to return to full function at home    Rehab Potential  Fair    PT Frequency  2x / week    PT Duration  6 weeks    PT Treatment/Interventions  ADLs/Self Care Home Management;Aquatic Therapy;Canalith Repostioning;Cryotherapy;Electrical Stimulation;Iontophoresis 4mg /ml Dexamethasone;Moist Heat;Traction;Ultrasound;DME Instruction;Gait training;Stair training;Therapeutic activities;Therapeutic exercise;Balance training;Neuromuscular re-education;Manual techniques;Passive range of motion;Dry needling;Vestibular    PT Next Visit Plan  Review HEP and progress as pt is able    PT Home Exercise Plan  Sit to stand without UE support 3 x 5, semitandem balance alternating forward LE    Consulted and Agree with Plan of Care  Patient       Patient will benefit from skilled therapeutic intervention in order to improve the following deficits and impairments:  Abnormal gait, Decreased balance, Decreased activity tolerance, Decreased strength, Difficulty walking  Visit Diagnosis: Difficulty in walking, not elsewhere classified  Unsteadiness  on feet     Problem List Patient Active Problem List   Diagnosis Date Noted  . Anxiety 07/19/2016  . Small cell lung cancer, right ([place]) 05/13/2016  . Bilateral presbyopia 05/06/2016  . Combined forms of age-related cataract of both eyes 05/06/2016  . Dry eye syndrome of both lacrimal glands 05/06/2016  . Chemoprophylaxis 04/29/2016  . Severe obesity (BMI 35.0-39.9) with comorbidity ([place]) 04/16/2016  . Elevated d-dimer 04/06/2016  . Weakness generalized 04/06/2016  . Sepsis ([place]) 03/17/2016  . Mass of upper lobe of right lung 03/01/2016  . Coronary atherosclerosis 02/29/2016  . Personal history of tobacco use, presenting hazards to health 02/28/2016  . Allergic rhinitis 09/27/2014  . Asthma 09/27/2014  . Anemia 09/27/2014  . Hyperglyceridemia, pure 09/27/2014  . Glaucoma 09/27/2014  . Hip pain 09/27/2014  . Right knee pain 09/27/2014  . Displacement of lumbar intervertebral disc without myelopathy 09/27/2014  . Thoracic back pain 09/27/2014  . Insomnia 08/11/2014  . COPD (chronic obstructive pulmonary disease) ([place]) 06/12/2014  . Type 2 diabetes mellitus ([place]) 06/12/2014  . Back pain 06/12/2014  . Essential hypertension 03/28/2014  . Smoking greater than 30 pack years 03/28/2014   Phillips Grout PT, DPT, GCS  Elira Colasanti 11/26/2017, 10:50 AM  [place] MAIN [facility] SERVICES [address] [place], [place], [address] Phone: [phone]   Fax:  [phone]  Name: AUSTYN PERRIELLO MRN: [id number] Date of Birth: 12-03-1955

## 2017-11-27 ENCOUNTER — Other Ambulatory Visit: Payer: Self-pay | Admitting: Family Medicine

## 2017-11-27 DIAGNOSIS — G8929 Other chronic pain: Secondary | ICD-10-CM

## 2017-11-27 DIAGNOSIS — M5442 Lumbago with sciatica, left side: Principal | ICD-10-CM

## 2017-11-27 DIAGNOSIS — M5441 Lumbago with sciatica, right side: Principal | ICD-10-CM

## 2017-11-27 NOTE — Telephone Encounter (Signed)
Pt requesting oxyCODONE-acetaminophen (PERCOCET) 10-325 MG tablet sent to [facility].  Pt states she knows she isn't due until 12/02/17 however wanted to call early since it was Halloween.

## 2017-11-28 ENCOUNTER — Ambulatory Visit: Payer: Self-pay | Admitting: Pharmacist

## 2017-11-28 NOTE — Chronic Care Management (AMB) (Signed)
  Chronic Care Management   Note  11/28/2017 Name: Yvette Riley MRN: [id number] DOB: 13-Aug-1955  62 y.o. year old female referred to Chronic Care Management team for  HTN disease management and care coordination by [facility], LPN.   CCM team services are being closed due to three unsuccessful outreach attempts.   Patient has been provided CCM contact information if he/she wishes to engage with care managers in the future.   Ruben Reason, PharmD Clinical Pharmacist [place] [phone]

## 2017-12-01 ENCOUNTER — Telehealth: Payer: Self-pay | Admitting: Family Medicine

## 2017-12-01 ENCOUNTER — Ambulatory Visit: Payer: Medicare Other

## 2017-12-01 ENCOUNTER — Other Ambulatory Visit: Payer: Self-pay | Admitting: Family Medicine

## 2017-12-01 MED ORDER — OXYCODONE-ACETAMINOPHEN 10-325 MG PO TABS: ORAL_TABLET | ORAL | 0 refills | Status: DC

## 2017-12-01 MED ORDER — GABAPENTIN 300 MG PO CAPS: 300.0000 mg | ORAL_CAPSULE | Freq: Three times a day (TID) | ORAL | 12 refills | Status: DC

## 2017-12-01 NOTE — Telephone Encounter (Signed)
Pt called back saying her skin has gotten really dry snice the weather change and she wants to know if Dr. Caryn Section can call in something.  [place]  thanks [employer]

## 2017-12-01 NOTE — Telephone Encounter (Signed)
Patient is wanting something called in for "dry skin" on her face.  She has used everything OTC and its not working.  [facility] [address]

## 2017-12-01 NOTE — Telephone Encounter (Signed)
Pt needs refill on   Gabapentin 300 mg  Oxycodone 10-[facility]  [facility] [address]  Thanks  [employer]

## 2017-12-01 NOTE — Telephone Encounter (Signed)
Pt still needing a refill  gabapentin (NEURONTIN) 300 MG capsule - only 1 left - needs Rx directions changed to 3 a day per pharmacy for pt's medication to be filled.    oxyCODONE-acetaminophen (PERCOCET) 10-325 MG tablet    Please fill at:  [facility] [facility] (N),  - [address] [phone] (Phone) [phone] (Fax)   Thanks, [employer]

## 2017-12-02 NOTE — Telephone Encounter (Signed)
LVMTRC 

## 2017-12-02 NOTE — Telephone Encounter (Signed)
There are no prescriptions for dry skin. OTC Sarna is usually the best thing.

## 2017-12-02 NOTE — Telephone Encounter (Signed)
Patient was advised and going to give the Sarna a try.

## 2017-12-03 ENCOUNTER — Ambulatory Visit: Payer: Medicare Other | Attending: Family Medicine

## 2017-12-08 ENCOUNTER — Ambulatory Visit: Payer: Medicare Other

## 2017-12-09 ENCOUNTER — Ambulatory Visit: Payer: Self-pay | Admitting: Family Medicine

## 2017-12-10 ENCOUNTER — Ambulatory Visit: Payer: Medicare Other

## 2017-12-15 ENCOUNTER — Ambulatory Visit: Payer: Medicare Other

## 2017-12-17 ENCOUNTER — Ambulatory Visit
Admission: RE | Admit: 2017-12-17 | Discharge: 2017-12-17 | Disposition: A | Discharge status: Home / Self Care | Payer: Medicare Other | Source: Ambulatory Visit | Attending: Family Medicine | Admitting: Family Medicine

## 2017-12-17 ENCOUNTER — Ambulatory Visit: Payer: Medicare Other

## 2017-12-17 ENCOUNTER — Telehealth: Payer: Self-pay

## 2017-12-17 DIAGNOSIS — C3491 Malignant neoplasm of unspecified part of right bronchus or lung: Secondary | ICD-10-CM | POA: Diagnosis not present

## 2017-12-17 DIAGNOSIS — R51 Headache: Secondary | ICD-10-CM | POA: Insufficient documentation

## 2017-12-17 DIAGNOSIS — R519 Headache, unspecified: Secondary | ICD-10-CM

## 2017-12-17 NOTE — Telephone Encounter (Signed)
Patient has been advised, she states that headaches have cleared for now but if they do return she will call office back in regards to referral. Yvette Riley

## 2017-12-17 NOTE — Telephone Encounter (Signed)
-----   Message from Birdie Sons, MD sent at 12/17/2017 12:59 PM EST ----- Mri is normal. If still having headaches then refer to neurology

## 2017-12-24 ENCOUNTER — Ambulatory Visit: Payer: Medicare Other

## 2017-12-26 ENCOUNTER — Other Ambulatory Visit: Payer: Self-pay

## 2017-12-26 DIAGNOSIS — M5442 Lumbago with sciatica, left side: Principal | ICD-10-CM

## 2017-12-26 DIAGNOSIS — G8929 Other chronic pain: Secondary | ICD-10-CM

## 2017-12-26 DIAGNOSIS — M5441 Lumbago with sciatica, right side: Principal | ICD-10-CM

## 2017-12-26 NOTE — Telephone Encounter (Signed)
Patient request refills as stated below. Patient should have remaining refills on her Gabapentin.

## 2017-12-26 NOTE — Telephone Encounter (Signed)
Patient calling for medication refill. oxyCODONE-acetaminophen (PERCOCET) 10-325 MG tablet  gabapentin (NEURONTIN) 300 MG capsule  Patient reports that she needs she is due until 01/01/18 but since is the holiday she just wanted to make sure she requested this and be safe.

## 2017-12-29 ENCOUNTER — Other Ambulatory Visit: Payer: Self-pay | Admitting: Family Medicine

## 2017-12-29 DIAGNOSIS — M5441 Lumbago with sciatica, right side: Principal | ICD-10-CM

## 2017-12-29 DIAGNOSIS — M5442 Lumbago with sciatica, left side: Principal | ICD-10-CM

## 2017-12-29 DIAGNOSIS — G8929 Other chronic pain: Secondary | ICD-10-CM

## 2017-12-29 NOTE — Telephone Encounter (Signed)
Needing a refill on:  oxyCODONE-acetaminophen (PERCOCET) 10-325 MG tablet  Please fill at:  [facility] (N), [place] - [address] [phone] (Phone) [phone] (Fax)   Thanks, [employer]

## 2017-12-30 MED ORDER — OXYCODONE-ACETAMINOPHEN 10-325 MG PO TABS: ORAL_TABLET | ORAL | 0 refills | Status: DC

## 2018-01-05 ENCOUNTER — Telehealth: Payer: Self-pay

## 2018-01-05 MED ORDER — UMECLIDINIUM-VILANTEROL 62.5-25 MCG/INH IN AEPB: 1.0000 | INHALATION_SPRAY | Freq: Every day | RESPIRATORY_TRACT | 12 refills | Status: DC

## 2018-01-05 NOTE — Telephone Encounter (Signed)
Pt advised.   Thanks,   -Jamee Pacholski  

## 2018-01-05 NOTE — Telephone Encounter (Signed)
Patient called office stating that insurance will not cover Stiolto Respimat and that cost is to high. Patient states that she was told to ask her physician for Anoro. Please review chart and advise. KW

## 2018-01-05 NOTE — Telephone Encounter (Signed)
Have [facility] [address]

## 2018-01-08 ENCOUNTER — Telehealth: Payer: Self-pay | Admitting: Family Medicine

## 2018-01-26 ENCOUNTER — Other Ambulatory Visit: Payer: Self-pay | Admitting: Family Medicine

## 2018-01-26 DIAGNOSIS — M5441 Lumbago with sciatica, right side: Principal | ICD-10-CM

## 2018-01-26 DIAGNOSIS — G8929 Other chronic pain: Secondary | ICD-10-CM

## 2018-01-26 DIAGNOSIS — M5442 Lumbago with sciatica, left side: Principal | ICD-10-CM

## 2018-01-26 NOTE — Telephone Encounter (Signed)
30 day dispensed 12-30-17

## 2018-01-26 NOTE — Telephone Encounter (Signed)
Pt needing refills on:  oxyCODONE-acetaminophen (PERCOCET) 10-325 MG tablet - can be filled on 01-30-18.  benzonatate (TESSALON PERLES) 100 MG capsule    Please fill at:  [facility] (N), [place] - [address] [phone] (Phone) [phone] (Fax)   Thanks, [employer]

## 2018-01-26 NOTE — Telephone Encounter (Signed)
Advised patient about Tessalon Perles and pending refill.

## 2018-01-26 NOTE — Telephone Encounter (Signed)
Spoke with [place] and patient still has 2 refills left of Tessalon Perles 100mg . Please advise.

## 2018-01-29 MED ORDER — OXYCODONE-ACETAMINOPHEN 10-325 MG PO TABS: ORAL_TABLET | ORAL | 0 refills | Status: DC

## 2018-01-30 ENCOUNTER — Telehealth: Payer: Self-pay

## 2018-01-30 NOTE — Telephone Encounter (Signed)
01/30/2018 called patient unable to talk said it would be better for her to call me back.  Gave her my name and number. Will route resource letter to [employer] as well.MA

## 2018-02-11 ENCOUNTER — Telehealth: Payer: Self-pay

## 2018-02-11 MED ORDER — GABAPENTIN 300 MG PO CAPS: 300.0000 mg | ORAL_CAPSULE | Freq: Four times a day (QID) | ORAL | 12 refills | Status: DC

## 2018-02-11 NOTE — Telephone Encounter (Signed)
Pt called stating that she was to get a new rx for gabapentin. She is now taking 300 mg three times day and she is still having a lot of pain and wakes up in the middle of the night with leg nerve pain.  She is asking if you can add an extra dose to where she is taking 4 capsules a day instead of three.

## 2018-02-11 NOTE — Telephone Encounter (Signed)
[facility] [address] [place], [place]

## 2018-02-11 NOTE — Addendum Note (Signed)
Addended by: Birdie Sons on: 02/11/2018 08:20 PM   Modules accepted: Orders

## 2018-02-12 ENCOUNTER — Telehealth: Payer: Self-pay | Admitting: Family Medicine

## 2018-02-12 DIAGNOSIS — J449 Chronic obstructive pulmonary disease, unspecified: Secondary | ICD-10-CM

## 2018-02-12 DIAGNOSIS — C3491 Malignant neoplasm of unspecified part of right bronchus or lung: Secondary | ICD-10-CM

## 2018-02-12 DIAGNOSIS — I2510 Atherosclerotic heart disease of native coronary artery without angina pectoris: Secondary | ICD-10-CM

## 2018-02-12 NOTE — Telephone Encounter (Signed)
Pt needing to discuss possible transportation to her appts.  Needing advise because she doesn't have transportation.  Please advise.  Thanks, [employer]

## 2018-02-13 NOTE — Telephone Encounter (Signed)
Did you want to see if the CCM team could help with this?

## 2018-02-16 ENCOUNTER — Ambulatory Visit: Payer: Self-pay

## 2018-02-16 DIAGNOSIS — J449 Chronic obstructive pulmonary disease, unspecified: Secondary | ICD-10-CM

## 2018-02-16 DIAGNOSIS — E119 Type 2 diabetes mellitus without complications: Secondary | ICD-10-CM

## 2018-02-16 NOTE — Chronic Care Management (AMB) (Signed)
  Chronic Care Management   Note  02/16/2018 Name: Yvette Riley MRN: [id number] DOB: October 20, 1955      [name] is a 63 year old female who sees Dr. Lelon Huh for primary care. Dr. Caryn Section asked the CCM team to consult the patient for assistance from chronic disease management team related to transportation barriers. [name] has been referred to the CCM Team in the past but calls were discontinued secondary to our inability to contact. New referral was placed 02/13/2018. Patient has a history of but not limited to COPD, DM2, HTN, Small cell lung CA, Back pain, and glaucoma.Telephone outreach to patient today to introduce CCM services.    Was unable to reach patient via telephone today. I have left HIPAA compliant voicemail asking patient to return my call. (unsuccessful outreach #1).  Plan: Will follow-up within 3-5  business days via telephone.   Seva Chancy E. [name], RN, BSN Nurse Care Coordinator [facility] Care Management [phone]

## 2018-02-20 ENCOUNTER — Ambulatory Visit: Payer: Self-pay | Admitting: Pharmacist

## 2018-02-20 DIAGNOSIS — E119 Type 2 diabetes mellitus without complications: Secondary | ICD-10-CM

## 2018-02-20 DIAGNOSIS — C3491 Malignant neoplasm of unspecified part of right bronchus or lung: Secondary | ICD-10-CM

## 2018-02-20 DIAGNOSIS — J449 Chronic obstructive pulmonary disease, unspecified: Secondary | ICD-10-CM

## 2018-02-20 NOTE — Chronic Care Management (AMB) (Signed)
  Chronic Care Management   Note  02/20/2018 Name: Yvette Riley MRN: [id number] DOB: [birth year]  Yvette Riley is a 63 y.o. year old female who sees Fisher, Kirstie Peri, MD for primary care. Dr. Caryn Section asked the CCM team to consult the patient for assistance with chronic disease management related to social needs- transportation. Referral was placed 02/13/2018. Telephone outreach to patient today to introduce CCM services.   HIPAA identifiers verified.  Patient states that transportation is hard right now because she doesn't have a car. However, she has applied and successfully set up [employer] transportation to her medical appointments. It is $5 which is affordable to her.   Follow up plan: I will close the referral as patient has had transportation needs met. I provided patient with CCM contact information should she have any needs in the future.   Ruben Reason, PharmD Clinical Pharmacist [place] [phone]

## 2018-02-25 ENCOUNTER — Other Ambulatory Visit: Payer: Self-pay | Admitting: Family Medicine

## 2018-02-25 DIAGNOSIS — M5441 Lumbago with sciatica, right side: Principal | ICD-10-CM

## 2018-02-25 DIAGNOSIS — M5442 Lumbago with sciatica, left side: Principal | ICD-10-CM

## 2018-02-25 DIAGNOSIS — G8929 Other chronic pain: Secondary | ICD-10-CM

## 2018-02-25 NOTE — Telephone Encounter (Signed)
Pt needing refills on:  oxyCODONE-acetaminophen (PERCOCET) 10-325 MG tablet zaleplon (SONATA) 10 MG capsule - Asking for new written prescription for  2 a night.  Pt not sleeping well and now taking 2 a night to sleep.  Please fill at:  [place] (N), [place] - [address] [phone] (Phone) [phone] (Fax)   Thanks, [employer]

## 2018-02-25 NOTE — Telephone Encounter (Signed)
Patient requesting refills. Please note patients request for change of instructions on Zaleplon.

## 2018-02-27 ENCOUNTER — Other Ambulatory Visit: Payer: Self-pay | Admitting: Family Medicine

## 2018-02-27 MED ORDER — OXYCODONE-ACETAMINOPHEN 10-325 MG PO TABS: ORAL_TABLET | ORAL | 0 refills | Status: DC

## 2018-02-27 MED ORDER — ZALEPLON 10 MG PO CAPS: ORAL_CAPSULE | ORAL | 3 refills | Status: DC

## 2018-02-27 NOTE — Telephone Encounter (Signed)
Pt calling back to check on refills. Has only 2 left of the oxycodone.  Please Advise.  Thanks, [employer]

## 2018-03-02 ENCOUNTER — Other Ambulatory Visit: Payer: Self-pay | Admitting: *Deleted

## 2018-03-02 DIAGNOSIS — C3491 Malignant neoplasm of unspecified part of right bronchus or lung: Secondary | ICD-10-CM

## 2018-03-03 ENCOUNTER — Ambulatory Visit
Admission: RE | Admit: 2018-03-03 | Discharge: 2018-03-03 | Disposition: A | Discharge status: Home / Self Care | Payer: Medicare Other | Source: Ambulatory Visit | Attending: Oncology | Admitting: Oncology

## 2018-03-03 ENCOUNTER — Inpatient Hospital Stay: Payer: Medicare Other | Attending: Oncology

## 2018-03-03 DIAGNOSIS — Z7984 Long term (current) use of oral hypoglycemic drugs: Secondary | ICD-10-CM | POA: Diagnosis not present

## 2018-03-03 DIAGNOSIS — C3491 Malignant neoplasm of unspecified part of right bronchus or lung: Secondary | ICD-10-CM | POA: Diagnosis not present

## 2018-03-03 DIAGNOSIS — R531 Weakness: Secondary | ICD-10-CM | POA: Insufficient documentation

## 2018-03-03 DIAGNOSIS — J449 Chronic obstructive pulmonary disease, unspecified: Secondary | ICD-10-CM | POA: Insufficient documentation

## 2018-03-03 DIAGNOSIS — Z791 Long term (current) use of non-steroidal anti-inflammatories (NSAID): Secondary | ICD-10-CM | POA: Diagnosis not present

## 2018-03-03 DIAGNOSIS — Z79899 Other long term (current) drug therapy: Secondary | ICD-10-CM | POA: Insufficient documentation

## 2018-03-03 DIAGNOSIS — Z87891 Personal history of nicotine dependence: Secondary | ICD-10-CM | POA: Insufficient documentation

## 2018-03-03 DIAGNOSIS — I10 Essential (primary) hypertension: Secondary | ICD-10-CM | POA: Diagnosis not present

## 2018-03-03 DIAGNOSIS — M549 Dorsalgia, unspecified: Secondary | ICD-10-CM | POA: Diagnosis not present

## 2018-03-03 DIAGNOSIS — G8929 Other chronic pain: Secondary | ICD-10-CM | POA: Insufficient documentation

## 2018-03-03 DIAGNOSIS — E119 Type 2 diabetes mellitus without complications: Secondary | ICD-10-CM | POA: Insufficient documentation

## 2018-03-03 LAB — CREATININE, SERUM
Creatinine, Ser: 0.74 mg/dL (ref 0.44–1.00)
GFR calc Af Amer: >60 mL/min (ref >60)
GFR calc non Af Amer: >60 mL/min (ref >60)

## 2018-03-03 MED ORDER — IOPAMIDOL (ISOVUE-300) INJECTION 61%: 75.0000 mL | Freq: Once | INTRAVENOUS | Status: DC | PRN

## 2018-03-05 ENCOUNTER — Ambulatory Visit
Admission: RE | Admit: 2018-03-05 | Discharge: 2018-03-05 | Disposition: A | Discharge status: Home / Self Care | Payer: Medicare Other | Source: Ambulatory Visit | Attending: Oncology | Admitting: Oncology

## 2018-03-05 DIAGNOSIS — C3431 Malignant neoplasm of lower lobe, right bronchus or lung: Secondary | ICD-10-CM | POA: Diagnosis not present

## 2018-03-05 DIAGNOSIS — C3491 Malignant neoplasm of unspecified part of right bronchus or lung: Secondary | ICD-10-CM | POA: Insufficient documentation

## 2018-03-05 DIAGNOSIS — Z5111 Encounter for antineoplastic chemotherapy: Secondary | ICD-10-CM | POA: Diagnosis not present

## 2018-03-05 MED ORDER — IOPAMIDOL (ISOVUE-300) INJECTION 61%
75.0000 mL | Freq: Once | INTRAVENOUS | Status: AC | PRN
  Administered 2018-03-05: 75 mL via INTRAVENOUS

## 2018-03-06 NOTE — Progress Notes (Signed)
Yvette Riley  [phone]) [phone] [phone]) [phone]  ID: Yvette Riley OB: 04-30-1955  MR#: [id number]  AOZ#:[id number]  Patient Care Team: Birdie Sons, MD as PCP - General (Family Medicine) Lorelee Cover., MD (Ophthalmology)  CHIEF COMPLAINT: Clinical stage IIIB small cell lung cancer of the right lung.  INTERVAL HISTORY: Patient returns to clinic today for further evaluation and discussion of her imaging results.  She continues to have chronic shortness of breath.  She also complains of increased weakness and fatigue.  She has chronic back pain. She has no neurologic complaints.  She denies any recent fevers or illnesses.  She has a good appetite and denies weight loss.  She denies any chest pain, cough, or hemoptysis.  She denies any nausea, vomiting, constipation, or diarrhea.  She denies any melena or hematochezia.  She has no urinary complaints.  Patient offers no further specific complaints today.  REVIEW OF SYSTEMS:   Review of Systems  Constitutional: Positive for malaise/fatigue. Negative for fever and weight loss.  Respiratory: Positive for shortness of breath. Negative for cough and hemoptysis.   Cardiovascular: Negative.  Negative for chest pain and leg swelling.  Gastrointestinal: Negative.  Negative for abdominal pain and constipation.  Genitourinary: Negative.  Negative for dysuria.  Musculoskeletal: Positive for back pain. Negative for myalgias.  Skin: Negative.  Negative for rash.  Neurological: Positive for weakness. Negative for sensory change, focal weakness and headaches.  Psychiatric/Behavioral: Negative.  The patient is not nervous/anxious.     As per HPI. Otherwise, a complete review of systems is negative.  PAST MEDICAL HISTORY: Past Medical History:  Diagnosis Date  . Allergy   . Anemia   . Anxiety   . Arthritis   . Asthma   . Blood transfusion without reported diagnosis   . C. difficile diarrhea 04/07/2016  . CAP  (community acquired pneumonia) 03/21/2015  . Combined forms of age-related cataract of both eyes 05/06/2016  . COPD (chronic obstructive pulmonary disease) ([place])   . Diabetes mellitus without complication ([place])   . Displacement of lumbar intervertebral disc without myelopathy   . Emphysema of lung ([place])   . GERD (gastroesophageal reflux disease)   . Glaucoma   . History of chicken pox   . Hypertension   . Pneumonia 03/29/2015  . Shortness of breath   . Small cell lung cancer ([place])     PAST SURGICAL HISTORY: Past Surgical History:  Procedure Laterality Date  . CESAREAN SECTION    . TUBAL LIGATION      FAMILY HISTORY: Family History  Problem Relation Age of Onset  . Heart failure Mother   . Heart disease Mother   . Stroke Mother   . Heart disease Brother   . COPD Brother   . Cancer Maternal Aunt        Breast Cancer    ADVANCED DIRECTIVES (Y/N):  N  HEALTH MAINTENANCE: Social History   Tobacco Use  . Smoking status: Former Smoker    Packs/day: 0.25    Years: 45.00    Pack years: 11.25    Types: Cigarettes    Last attempt to quit: 03/17/2016    Years since quitting: 1.9  . Smokeless tobacco: Never Used  . Tobacco comment: pt states she quit in 2016-2017  Substance Use Topics  . Alcohol use: No    Alcohol/week: 0.0 standard drinks  . Drug use: No     Colonoscopy:  PAP:  Bone density:  Lipid panel:  Allergies  Allergen Reactions  . Codeine Anaphylaxis  . Crestor [Rosuvastatin] Anaphylaxis  . Other Anaphylaxis  . Penicillins Anaphylaxis, Rash and Other (See Comments)    Reaction:  Tongue swelling  Has patient had a PCN reaction causing immediate rash, facial/tongue/throat swelling, SOB or lightheadedness with hypotension:  Yes   Has patient had a PCN reaction causing severe rash involving mucus membranes or skin necrosis: No Has patient had a PCN reaction that required hospitalization No Has patient had a PCN reaction occurring within the last 10 years:  No If all of the above answers are "NO", then may proceed with Cephalosporin use.  . Yellow Jacket Venom [Bee Venom] Anaphylaxis  . Gemfibrozil Rash, Nausea And Vomiting and Swelling  . Statins Nausea And Vomiting and Swelling  . Trazodone And Nefazodone Nausea And Vomiting  . Lipitor [Atorvastatin] Rash    Current Outpatient Medications  Medication Sig Dispense Refill  . albuterol (PROVENTIL) (2.5 MG/3ML) 0.083% nebulizer solution Take 3 mLs (2.5 mg total) by nebulization every 6 (six) hours as needed for wheezing or shortness of breath. 75 mL 3  . benzonatate (TESSALON PERLES) 100 MG capsule Take 1 capsule (100 mg total) by mouth 3 (three) times daily as needed for cough. 30 capsule 5  . clonazePAM (KLONOPIN) 0.5 MG tablet Take 1 tablet (0.5 mg total) by mouth 2 (two) times daily as needed. 60 tablet 1  . ezetimibe (ZETIA) 10 MG tablet Take 1 tablet (10 mg total) by mouth daily. 30 tablet 4  . gabapentin (NEURONTIN) 300 MG capsule Take 1 capsule (300 mg total) by mouth 4 (four) times daily. 120 capsule 12  . glucose blood test strip Contour test strip. Use to check blood sugar once a day for for type 2 diabetes (E11.9) 100 each 4  . Lancets MISC Use to check blood sugar once daily for type 2 diabetes E11.9 100 each 4  . meloxicam (MOBIC) 15 MG tablet Take 1 tablet (15 mg total) by mouth daily as needed for pain. 90 tablet 2  . metFORMIN (GLUCOPHAGE XR) 500 MG 24 hr tablet Take 2 tablets (1,000 mg total) by mouth daily with breakfast. With meals 60 tablet 3  . montelukast (SINGULAIR) 10 MG tablet Take 1 tablet (10 mg total) by mouth at bedtime. 90 tablet 3  . NARCAN 4 MG/0.1ML LIQD nasal spray kit ADMINISTER A SINGLE SPRAY IN ONE NOSTRIL UPON SIGNS OF OPIOID OVERDOSE. CALL 911. REPEAT AFTER 3 MINUTES IF NO RESPONSE.  99  . Nutritional Supplements (GLUCERNA ADVANCE SHAKE) LIQD Drink 1-2 shakes per day 24 Bottle 12  . oxyCODONE-acetaminophen (PERCOCET) 10-325 MG tablet One tablet every 4-6  hours as needed. 180 tablet 0  . PROAIR HFA 108 (90 Base) MCG/ACT inhaler INHALE 2 PUFFS BY MOUTH EVERY 6 HOURS AS NEEDED FOR WHEEZING OR SHORTNESS OF BREATH  3  . umeclidinium-vilanterol (ANORO ELLIPTA) 62.5-25 MCG/INH AEPB Inhale 1 puff into the lungs daily. 30 each 12  . zaleplon (SONATA) 10 MG capsule TAKE 1 CAPSULE BY MOUTH IN THE EVENING. MAY TAKE SECOND CAPSULE AFTER 2 HOURS IF NEEDED 30 capsule 3   No current facility-administered medications for this visit.     OBJECTIVE: Vitals:   03/12/18 1103  BP: 125/83  Pulse: (!) 101  Resp: 18  Temp: (!) 97.5 F (36.4 C)     Body mass index is 37.83 kg/m.    ECOG FS:0 - Asymptomatic  General: Well-developed, well-nourished, no acute distress. Eyes: Pink conjunctiva, anicteric sclera. HEENT: Normocephalic, moist mucous  membranes. Lungs: Clear to auscultation bilaterally. Heart: Regular rate and rhythm. No rubs, murmurs, or gallops. Abdomen: Soft, nontender, nondistended. No organomegaly noted, normoactive bowel sounds. Musculoskeletal: No edema, cyanosis, or clubbing. Neuro: Alert, answering all questions appropriately. Cranial nerves grossly intact. Skin: No rashes or petechiae noted. Psych: Normal affect.   LAB RESULTS:  Lab Results  Component Value Date   NA 138 07/30/2017   K 3.7 07/30/2017   CL 106 07/30/2017   CO2 23 07/30/2017   GLUCOSE 117 (H) 07/30/2017   BUN 13 07/30/2017   CREATININE 0.74 03/03/2018   CALCIUM 9.3 07/30/2017   PROT 7.6 07/30/2017   ALBUMIN 4.3 07/30/2017   AST 23 07/30/2017   ALT 16 07/30/2017   ALKPHOS 70 07/30/2017   BILITOT 1.3 (H) 07/30/2017   GFRNONAA >60 03/03/2018   GFRAA >60 03/03/2018    Lab Results  Component Value Date   WBC 8.7 07/30/2017   NEUTROABS 7.0 (H) 07/30/2017   HGB 12.5 07/30/2017   HCT 36.4 07/30/2017   MCV 90.0 07/30/2017   PLT 260 07/30/2017     STUDIES: Ct Chest W Contrast  Result Date: 03/05/2018 CLINICAL DATA:  Small cell right lung cancer status  post concurrent chemotherapy and radiation therapy. Restaging. EXAM: CT CHEST WITH CONTRAST TECHNIQUE: Multidetector CT imaging of the chest was performed during intravenous contrast administration. CONTRAST:  43m ISOVUE-300 IOPAMIDOL (ISOVUE-300) INJECTION 61% COMPARISON:  08/27/2017 chest CT. FINDINGS: Cardiovascular: Normal heart size. Stable small pericardial effusion. Left anterior descending and left circumflex coronary atherosclerosis. Great vessels are normal in course and caliber. No central pulmonary emboli. Mediastinum/Nodes: No discrete thyroid nodules. Stable circumferential wall thickening in midthoracic esophagus. No pathologically enlarged axillary, mediastinal or hilar lymph nodes. Lungs/Pleura: No pneumothorax. No pleural effusion. Sharply marginated perihilar consolidation in the upper right lung with associated volume loss, bronchiectasis and distortion, unchanged, compatible with radiation fibrosis. Solid 3 mm medial basilar right lower lobe pulmonary nodule (series 3/image 96), stable since at least 12/26/2016 chest CT, considered benign. No acute consolidative airspace disease or new significant pulmonary nodules. Previously noted patchy tree-in-bud opacities in the lower lobes have resolved. Persistent minimal ground-glass tree-in-bud opacity in the right middle lobe. Upper abdomen: No acute abnormality. Musculoskeletal:  No aggressive appearing focal osseous lesions. IMPRESSION: 1. 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 No evidence of local tumor recurrence. 2. No findings of metastatic disease in the chest. 3. Resolved lower lobe tree-in-bud opacities. Minimal persistent ground-glass tree-in-bud opacity in the right middle lobe, compatible with minimal nonspecific bronchiolitis. 4. Stable small pericardial effusion. 5. Stable circumferential wall thickening in the midthoracic esophagus compatible with post treatment change. Electronically Signed   By: JIlona Sorrel M.D.   On: 03/05/2018 16:43    ASSESSMENT: Clinical stage IIIB small cell lung cancer of the right lung.  PLAN:    1. Clinical stage IIIB small cell lung cancer of the right lung: Patient completed concurrent chemotherapy and XRT at outside facility [facility] for recurrence. She completed cycle 6 of carboplatinum and etoposide on August 15, 2016.  She also completed PCI.  Her most recent CT scan on March 05, 2018 reviewed independently and reported as above with no obvious evidence of progressive or recurrent disease.  Return to clinic in 6 months with repeat imaging and further evaluation.  2.  Cough/shortness of breath: Patient has requested referral to a different pulmonologist. 3.  Back pain: Chronic, continue symptomatic  treatment. 4.  Weakness and fatigue: Will draw hemoglobin and iron stores at next clinic visit.  Patient expressed understanding and was in agreement with this plan. She also understands that She can call clinic at any time with any questions, concerns, or complaints.   Cancer Staging Small cell lung cancer, right [facility]) Staging form: Lung, AJCC 8th Edition - Clinical stage from 08/18/2016: Stage IIIB (cT2a, cN3, cM0) - Signed by Lloyd Huger, MD on 08/18/2016   Lloyd Huger, MD   03/13/2018 12:05 PM

## 2018-03-10 ENCOUNTER — Other Ambulatory Visit: Payer: Self-pay | Admitting: Family Medicine

## 2018-03-10 DIAGNOSIS — R05 Cough: Secondary | ICD-10-CM

## 2018-03-10 DIAGNOSIS — R059 Cough, unspecified: Secondary | ICD-10-CM

## 2018-03-10 MED ORDER — BENZONATATE 100 MG PO CAPS: 100.0000 mg | ORAL_CAPSULE | Freq: Three times a day (TID) | ORAL | 5 refills | Status: DC | PRN

## 2018-03-10 NOTE — Telephone Encounter (Signed)
Pt needs refill on the Benzonatate 100 mg   [facility]  [address]  [employer]

## 2018-03-12 ENCOUNTER — Inpatient Hospital Stay (HOSPITAL_BASED_OUTPATIENT_CLINIC_OR_DEPARTMENT_OTHER): Payer: Medicare Other | Admitting: Oncology

## 2018-03-12 ENCOUNTER — Other Ambulatory Visit: Payer: Self-pay

## 2018-03-12 ENCOUNTER — Encounter: Payer: Self-pay | Admitting: Oncology

## 2018-03-12 VITALS — BP 125/83 | HR 101 | Temp 97.5°F | Resp 18 | Wt 181.0 lb

## 2018-03-12 DIAGNOSIS — R531 Weakness: Secondary | ICD-10-CM | POA: Diagnosis not present

## 2018-03-12 DIAGNOSIS — Z9221 Personal history of antineoplastic chemotherapy: Secondary | ICD-10-CM | POA: Diagnosis not present

## 2018-03-12 DIAGNOSIS — C3491 Malignant neoplasm of unspecified part of right bronchus or lung: Secondary | ICD-10-CM | POA: Diagnosis not present

## 2018-03-12 DIAGNOSIS — R05 Cough: Secondary | ICD-10-CM | POA: Diagnosis not present

## 2018-03-12 DIAGNOSIS — Z85118 Personal history of other malignant neoplasm of bronchus and lung: Secondary | ICD-10-CM | POA: Diagnosis not present

## 2018-03-12 DIAGNOSIS — R0602 Shortness of breath: Secondary | ICD-10-CM | POA: Diagnosis not present

## 2018-03-12 DIAGNOSIS — G8929 Other chronic pain: Secondary | ICD-10-CM | POA: Diagnosis not present

## 2018-03-12 DIAGNOSIS — M549 Dorsalgia, unspecified: Secondary | ICD-10-CM | POA: Diagnosis not present

## 2018-03-12 DIAGNOSIS — I10 Essential (primary) hypertension: Secondary | ICD-10-CM | POA: Diagnosis not present

## 2018-03-12 DIAGNOSIS — R5383 Other fatigue: Secondary | ICD-10-CM | POA: Diagnosis not present

## 2018-03-12 DIAGNOSIS — Z923 Personal history of irradiation: Secondary | ICD-10-CM | POA: Diagnosis not present

## 2018-03-12 DIAGNOSIS — E119 Type 2 diabetes mellitus without complications: Secondary | ICD-10-CM | POA: Diagnosis not present

## 2018-03-12 DIAGNOSIS — Z87891 Personal history of nicotine dependence: Secondary | ICD-10-CM

## 2018-03-12 DIAGNOSIS — J449 Chronic obstructive pulmonary disease, unspecified: Secondary | ICD-10-CM | POA: Diagnosis not present

## 2018-03-12 NOTE — Progress Notes (Signed)
Patient here today for follow up regarding lung cancer. Patient reports cough, feeling tired. Patient requesting that her iron stores be checked.

## 2018-03-13 ENCOUNTER — Other Ambulatory Visit: Payer: Self-pay

## 2018-03-13 ENCOUNTER — Telehealth: Payer: Self-pay | Admitting: Family Medicine

## 2018-03-13 ENCOUNTER — Other Ambulatory Visit: Payer: Self-pay | Admitting: Family Medicine

## 2018-03-13 DIAGNOSIS — F419 Anxiety disorder, unspecified: Secondary | ICD-10-CM

## 2018-03-13 NOTE — Telephone Encounter (Signed)
Patient advised.

## 2018-03-13 NOTE — Telephone Encounter (Signed)
Pt requesting to be put back on oxygen to help her sleep at night due to her sleep apnea. Please advise.  Thanks, [employer]

## 2018-03-13 NOTE — Telephone Encounter (Signed)
Please advise 

## 2018-03-13 NOTE — Telephone Encounter (Signed)
Her insurance doesn't cover it.

## 2018-03-26 ENCOUNTER — Other Ambulatory Visit: Payer: Self-pay | Admitting: Family Medicine

## 2018-03-26 DIAGNOSIS — R059 Cough, unspecified: Secondary | ICD-10-CM

## 2018-03-26 DIAGNOSIS — R05 Cough: Secondary | ICD-10-CM

## 2018-03-26 DIAGNOSIS — G8929 Other chronic pain: Secondary | ICD-10-CM

## 2018-03-26 DIAGNOSIS — M5442 Lumbago with sciatica, left side: Secondary | ICD-10-CM

## 2018-03-26 DIAGNOSIS — M5441 Lumbago with sciatica, right side: Secondary | ICD-10-CM

## 2018-03-26 NOTE — Telephone Encounter (Signed)
Pt needs refill on her pain medication  Oxycodone 10-325 &  Tessalon Pearls 100 mg  Kim  CB# [phone]   Thanks [employer]

## 2018-03-27 NOTE — Telephone Encounter (Signed)
Pt calling to check on refills of the medications.  Please advise.  Thanks, [employer]

## 2018-03-29 MED ORDER — BENZONATATE 100 MG PO CAPS: 100.0000 mg | ORAL_CAPSULE | Freq: Three times a day (TID) | ORAL | 3 refills | Status: DC | PRN

## 2018-03-29 MED ORDER — OXYCODONE-ACETAMINOPHEN 10-325 MG PO TABS: ORAL_TABLET | ORAL | 0 refills | Status: DC

## 2018-03-31 DIAGNOSIS — R0602 Shortness of breath: Secondary | ICD-10-CM | POA: Diagnosis not present

## 2018-03-31 DIAGNOSIS — G4733 Obstructive sleep apnea (adult) (pediatric): Secondary | ICD-10-CM | POA: Diagnosis not present

## 2018-03-31 DIAGNOSIS — R05 Cough: Secondary | ICD-10-CM | POA: Diagnosis not present

## 2018-03-31 DIAGNOSIS — J439 Emphysema, unspecified: Secondary | ICD-10-CM | POA: Diagnosis not present

## 2018-03-31 NOTE — Progress Notes (Signed)
[facility]     Assessment and Plan:  Small cell lung cancer. --Status post chemo and radiation, currently following with oncology and cancer appears to be controlled.   COPD with acute bronchitis.  --Anoro puff per day. Instructed not to mix with Stiolto or Spiriva.  --Pt was given steroid yesterday and asked to fill it.  --Pt will be moving her care here as per her request.   Nicotine abuse.  --She has stopped smoking and is congratulated. She is urged not to restart.   Chronic hypoxic respiratory failure. - Continue oxygen 2 L at night.  Patient is interested in getting a portable oxygen concentrator during the day.  Instructed that she has to demonstrate oxygen desaturation, therefore will order 6-minute walk test.  Polypharmacy. - Patient has multiple interacting inhalers that she is using at the same time, which include Spiriva, Anoro, Stiolto.  She is encouraged to use only Anoro 1 puff once a day and immediately stop the other inhalers.  She is instructed that these 3 inhalers are not to be used together.  Orders Placed This Encounter  Procedures  . 6 minute walk   Return in about 3 months (around 07/02/2018).   Date: 03/31/2018  MRN# [id number] Yvette Riley Mar 23, 1955    Yvette Riley is a 63 y.o. old female seen in consultation for chief complaint of:    Chief Complaint  Patient presents with  . Follow-up    pt reports of sob with exertion, prod cough with yellow mucus, nasal/head congestion &  wheezing    HPI:  The patient is a 63-year-old female with small cell lung cancer.  The patient is seen by Dr. Raul Del, she was referred and March 2018 for possible bronchoscopy.  However she was she was subsequently admitted to another institution and diagnosed on 04/15/2016 with right supraclavicular lymph node aspirate.  She was diagnosed with clinical stage IIIB small cell lung cancer of the right lung.  She got her treatment different institution,  but then transferred her care here.  She is status post chemo radiation, currently following with Dr. Grayland Ormond felt to have stable disease.  She was previously seeing Dr. Raul Del, but wanted to transfer her care here. She saw Dr. Raul Del just yesterday and was put on prednisone.   Today she feels that her breathing has continued to rough. She is on oxygen at night but she would like a portable oxygen. She had a walk test yesterday at [facility] and did not desat.  She ran out of spiriva, she is on anoro every morning. She takes albuterol rarely.  She has not smoked since her cancer diagnosis.     Sat at [facility] 04/04/16; @ RA at rest is 97% and HR 94; Ambulated slowly with a cane. After 180 feet sat was 92%, HR 110.   Review of tracings, PLT 03/29/16: FEV is 63% of predicted, there is no CVA reversibility with bronchodilator. FVC is 62% of predicted. Ratio is 74%. RV to TLC ratio is elevated, TLC is 84%. DLCO 79%. Overall this test is consistent with mild restrictive lung disease, there is slight air trapping which may be indicative of mild COPD.   Medication:   Reviewed.     Allergies:  Codeine; Crestor [rosuvastatin]; Other; Penicillins; Yellow jacket venom [bee venom]; Gemfibrozil; Statins; Trazodone and nefazodone; and Lipitor [atorvastatin]  Review of Systems:  Constitutional: Feels well. Cardiovascular: Denies chest pain, exertional chest pain.  Pulmonary: Denies hemoptysis, pleuritic chest pain.  The remainder of systems were reviewed and were found to be negative other than what is documented in the HPI.    Physical Examination:   VS: BP 110/80 (BP Location: Left Arm, Cuff Size: Normal)   Pulse (!) 129   Ht 4\' 10"  (1.473 m)   Wt 192 lb (87.1 kg)   SpO2 95%   BMI 40.13 kg/m   General Appearance: No distress  Neuro:without focal findings, mental status, speech normal, alert and oriented HEENT: PERRLA, EOM intact Pulmonary: No wheezing, No rales  CardiovascularNormal S1,S2.  No  m/r/g.  Abdomen: Benign, Soft, non-tender, No masses Renal:  No costovertebral tenderness  GU:  No performed at this time. Endoc: No evident thyromegaly, no signs of acromegaly or Cushing features Skin:   warm, no rashes, no ecchymosis  Extremities: normal, no cyanosis, clubbing.      LABORATORY PANEL:   CBC No results for input(s): WBC, HGB, HCT, PLT in the last 168 hours. ------------------------------------------------------------------------------------------------------------------  Chemistries  No results for input(s): NA, K, CL, CO2, GLUCOSE, BUN, CREATININE, CALCIUM, MG, AST, ALT, ALKPHOS, BILITOT in the last 168 hours.  Invalid input(s): GFRCGP ------------------------------------------------------------------------------------------------------------------  Cardiac Enzymes No results for input(s): TROPONINI in the last 168 hours. ------------------------------------------------------------  RADIOLOGY:  No results found.     Thank  you for the consultation and for allowing [facility] to assist in the care of your patient. Our recommendations are noted above.  Please contact us if we can be of further service.  Marda Stalker, M.D., F.C.C.P.  Board Certified in Internal Medicine, Pulmonary Medicine, [place], and Sleep Medicine.  [facility] Office Number: [phone]  03/31/2018

## 2018-04-01 ENCOUNTER — Encounter: Payer: Self-pay | Admitting: Internal Medicine

## 2018-04-01 ENCOUNTER — Ambulatory Visit (INDEPENDENT_AMBULATORY_CARE_PROVIDER_SITE_OTHER): Payer: Medicare Other | Admitting: Internal Medicine

## 2018-04-01 VITALS — BP 110/80 | HR 129 | Ht <= 58 in | Wt 192.0 lb

## 2018-04-01 DIAGNOSIS — I2510 Atherosclerotic heart disease of native coronary artery without angina pectoris: Secondary | ICD-10-CM

## 2018-04-01 DIAGNOSIS — C3490 Malignant neoplasm of unspecified part of unspecified bronchus or lung: Secondary | ICD-10-CM

## 2018-04-01 DIAGNOSIS — J449 Chronic obstructive pulmonary disease, unspecified: Secondary | ICD-10-CM

## 2018-04-01 NOTE — Patient Instructions (Addendum)
Use Anoro 1 puff once daily. Do not use Anoro with Stiolto OR Spiriva.  Will perform 6 minute walk to see if you qualify for portable oxygen.

## 2018-04-09 ENCOUNTER — Ambulatory Visit: Payer: Medicare Other

## 2018-04-10 ENCOUNTER — Telehealth: Payer: Self-pay

## 2018-04-10 ENCOUNTER — Telehealth: Payer: Self-pay | Admitting: Internal Medicine

## 2018-04-10 MED ORDER — GABAPENTIN 300 MG PO CAPS: 300.0000 mg | ORAL_CAPSULE | Freq: Every day | ORAL | 3 refills | Status: DC

## 2018-04-10 NOTE — Telephone Encounter (Signed)
ATC patient and no answer.  LMOVM for her to return call to R/S SMW. Rhonda J Cobb

## 2018-04-10 NOTE — Telephone Encounter (Signed)
Gabapentin at QID is not helping and wants to go to 5 times daily.  Can you send in new Rx. Patient uses [facility] # [phone]

## 2018-04-16 NOTE — Telephone Encounter (Signed)
Pt called back and said that March 31st at 9:45a is a date and time that she is available.

## 2018-04-16 NOTE — Telephone Encounter (Signed)
Pt returned call [phone]

## 2018-04-16 NOTE — Telephone Encounter (Signed)
ATC patient back. No answer. LMOVM  to advise patient that we would be unable to schedule the SMW on 04/28/2018 at 9:45 due to there being 3 providers in clinic.  LM for pt to return my call that the SMW are done in the afternoon due to providers in clinic seeing patients and we currently only have 2 nurses.  Rhonda J Cobb

## 2018-04-16 NOTE — Telephone Encounter (Signed)
Called and spoke with patient and she stated that she would have to arrange transportation before she can R/S.  Advised patient that we only had 30 days from her appointment to complete the walk and get the order to the DME company and processed before that 30 day period.    Pt voiced understanding and stated that she would contact me back. Rhonda J Cobb

## 2018-04-17 NOTE — Telephone Encounter (Signed)
SMW R/S to Friday 04/24/2018 at 2:00 at [facility] in [place]. Pt is aware of appointment and if unable to get transportation, pt will call to R/S. Nothing else needed at this time. Rhonda J Cobb

## 2018-04-24 ENCOUNTER — Ambulatory Visit: Payer: Medicare Other

## 2018-04-27 ENCOUNTER — Other Ambulatory Visit: Payer: Self-pay

## 2018-04-27 ENCOUNTER — Other Ambulatory Visit: Payer: Self-pay | Admitting: Family Medicine

## 2018-04-27 DIAGNOSIS — G8929 Other chronic pain: Secondary | ICD-10-CM

## 2018-04-27 DIAGNOSIS — R05 Cough: Secondary | ICD-10-CM

## 2018-04-27 DIAGNOSIS — M5442 Lumbago with sciatica, left side: Principal | ICD-10-CM

## 2018-04-27 DIAGNOSIS — R059 Cough, unspecified: Secondary | ICD-10-CM

## 2018-04-27 DIAGNOSIS — M5441 Lumbago with sciatica, right side: Principal | ICD-10-CM

## 2018-04-27 MED ORDER — BENZONATATE 100 MG PO CAPS: 100.0000 mg | ORAL_CAPSULE | Freq: Three times a day (TID) | ORAL | 4 refills | Status: DC | PRN

## 2018-04-27 NOTE — Telephone Encounter (Signed)
Patient called requesting a refill. Patient says she has been taking the [employer] for several months, and her oncologist to her to stay on them. Please advise. ([facility] [address])

## 2018-04-28 MED ORDER — OXYCODONE-ACETAMINOPHEN 10-325 MG PO TABS: ORAL_TABLET | ORAL | 0 refills | Status: DC

## 2018-05-27 ENCOUNTER — Other Ambulatory Visit: Payer: Self-pay

## 2018-05-27 DIAGNOSIS — G8929 Other chronic pain: Secondary | ICD-10-CM

## 2018-05-27 DIAGNOSIS — M5442 Lumbago with sciatica, left side: Principal | ICD-10-CM

## 2018-05-27 DIAGNOSIS — M5441 Lumbago with sciatica, right side: Principal | ICD-10-CM

## 2018-05-27 NOTE — Telephone Encounter (Signed)
Last dispensed 30 day supply on 04/29/2018

## 2018-05-29 ENCOUNTER — Telehealth: Payer: Self-pay

## 2018-05-29 MED ORDER — OXYCODONE-ACETAMINOPHEN 10-325 MG PO TABS: ORAL_TABLET | ORAL | 0 refills | Status: DC

## 2018-05-29 NOTE — Telephone Encounter (Signed)
Pt called requesting a refill on her oxycodone.  She is out as of this morning.  [facility] [address]  Call back # [phone].

## 2018-06-02 ENCOUNTER — Other Ambulatory Visit: Payer: Self-pay

## 2018-06-02 ENCOUNTER — Encounter: Payer: Self-pay | Admitting: Family Medicine

## 2018-06-02 ENCOUNTER — Ambulatory Visit (INDEPENDENT_AMBULATORY_CARE_PROVIDER_SITE_OTHER): Payer: Medicare Other | Admitting: Family Medicine

## 2018-06-02 VITALS — BP 108/80 | HR 88 | Temp 98.6°F | Resp 16 | Wt 189.0 lb

## 2018-06-02 DIAGNOSIS — E119 Type 2 diabetes mellitus without complications: Secondary | ICD-10-CM | POA: Diagnosis not present

## 2018-06-02 DIAGNOSIS — G4700 Insomnia, unspecified: Secondary | ICD-10-CM

## 2018-06-02 DIAGNOSIS — R05 Cough: Secondary | ICD-10-CM | POA: Diagnosis not present

## 2018-06-02 DIAGNOSIS — I10 Essential (primary) hypertension: Secondary | ICD-10-CM

## 2018-06-02 DIAGNOSIS — I2510 Atherosclerotic heart disease of native coronary artery without angina pectoris: Secondary | ICD-10-CM

## 2018-06-02 DIAGNOSIS — F419 Anxiety disorder, unspecified: Secondary | ICD-10-CM | POA: Diagnosis not present

## 2018-06-02 DIAGNOSIS — R053 Chronic cough: Secondary | ICD-10-CM

## 2018-06-02 DIAGNOSIS — R5383 Other fatigue: Secondary | ICD-10-CM

## 2018-06-02 MED ORDER — RAMELTEON 8 MG PO TABS: 8.0000 mg | ORAL_TABLET | Freq: Every day | ORAL | 3 refills | Status: DC

## 2018-06-02 MED ORDER — BENZONATATE 100 MG PO CAPS: 100.0000 mg | ORAL_CAPSULE | Freq: Three times a day (TID) | ORAL | 3 refills | Status: DC | PRN

## 2018-06-02 MED ORDER — CLONAZEPAM 0.5 MG PO TABS: 0.5000 mg | ORAL_TABLET | Freq: Two times a day (BID) | ORAL | 2 refills | Status: DC | PRN

## 2018-06-02 NOTE — Progress Notes (Signed)
Patient: Yvette Riley Female    DOB: 12/11/1955   63 y.o.   MRN: [id number] Visit Date: 06/02/2018  Today's Provider: Lelon Huh, MD   Chief Complaint  Patient presents with  . Fatigue    Pt is concerned her iron is low.   . Insomnia   Subjective:     Fatgiue Presents for follow-up visit. Symptoms include malaise/fatigue. There has been no abdominal pain, anorexia, bruising/bleeding easily, confusion, fever, light-headedness, palpitations, paresthesias, pica or weight loss. Has been feeling fatigued  For a few months.   Insomnia  Primary symptoms: difficulty falling asleep, malaise/fatigue.  The problem occurs nightly. The problem is unchanged. Past treatments include meditation. The treatment provided no relief. Typical bedtime:  10-11 P.M..  How long after going to bed to you fall asleep: over an hour.   PMH includes: no apnea.  Previously on ambienReports history of iron deficiency, now on zaleplon which has not been as effective.    Allergies  Allergen Reactions  . Codeine Anaphylaxis  . Crestor [Rosuvastatin] Anaphylaxis  . Other Anaphylaxis  . Penicillins Anaphylaxis, Rash and Other (See Comments)    Reaction:  Tongue swelling  Has patient had a PCN reaction causing immediate rash, facial/tongue/throat swelling, SOB or lightheadedness with hypotension:  Yes   Has patient had a PCN reaction causing severe rash involving mucus membranes or skin necrosis: No Has patient had a PCN reaction that required hospitalization No Has patient had a PCN reaction occurring within the last 10 years: No If all of the above answers are "NO", then may proceed with Cephalosporin use.  . [place] Venom [Bee Venom] Anaphylaxis  . Gemfibrozil Rash, Nausea And Vomiting and Swelling  . Statins Nausea And Vomiting and Swelling  . Trazodone And Nefazodone Nausea And Vomiting  . Lipitor [Atorvastatin] Rash     Current Outpatient Medications:  .  albuterol (PROVENTIL) (2.5  MG/3ML) 0.083% nebulizer solution, Take 3 mLs (2.5 mg total) by nebulization every 6 (six) hours as needed for wheezing or shortness of breath., Disp: 75 mL, Rfl: 3 .  benzonatate (TESSALON PERLES) 100 MG capsule, Take 1 capsule (100 mg total) by mouth 3 (three) times daily as needed for cough., Disp: 30 capsule, Rfl: 4 .  clonazePAM (KLONOPIN) 0.5 MG tablet, TAKE 1 TABLET BY MOUTH TWICE DAILY AS NEEDED, Disp: 60 tablet, Rfl: 2 .  ezetimibe (ZETIA) 10 MG tablet, Take 1 tablet (10 mg total) by mouth daily., Disp: 30 tablet, Rfl: 4 .  gabapentin (NEURONTIN) 300 MG capsule, Take 1 capsule (300 mg total) by mouth 5 (five) times daily., Disp: 150 capsule, Rfl: 3 .  glucose blood test strip, Contour test strip. Use to check blood sugar once a day for for type 2 diabetes (E11.9), Disp: 100 each, Rfl: 4 .  Lancets MISC, Use to check blood sugar once daily for type 2 diabetes E11.9, Disp: 100 each, Rfl: 4 .  meloxicam (MOBIC) 15 MG tablet, TAKE 1 TABLET BY MOUTH ONCE DAILY AS NEEDED FOR PAIN, Disp: 90 tablet, Rfl: 3 .  metFORMIN (GLUCOPHAGE XR) 500 MG 24 hr tablet, Take 2 tablets (1,000 mg total) by mouth daily with breakfast. With meals, Disp: 60 tablet, Rfl: 3 .  montelukast (SINGULAIR) 10 MG tablet, Take 1 tablet (10 mg total) by mouth at bedtime., Disp: 90 tablet, Rfl: 3 .  NARCAN 4 MG/0.1ML LIQD nasal spray kit, ADMINISTER A SINGLE SPRAY IN ONE NOSTRIL UPON SIGNS OF OPIOID OVERDOSE. CALL 911.  REPEAT AFTER 3 MINUTES IF NO RESPONSE., Disp: , Rfl: 99 .  oxyCODONE-acetaminophen (PERCOCET) 10-325 MG tablet, One tablet every 4-6 hours as needed., Disp: 180 tablet, Rfl: 0 .  umeclidinium-vilanterol (ANORO ELLIPTA) 62.5-25 MCG/INH AEPB, Inhale 1 puff into the lungs daily., Disp: 30 each, Rfl: 12 .  zaleplon (SONATA) 10 MG capsule, TAKE 1 CAPSULE BY MOUTH IN THE EVENING. MAY TAKE SECOND CAPSULE AFTER 2 HOURS IF NEEDED, Disp: 30 capsule, Rfl: 3 .  Nutritional Supplements (GLUCERNA ADVANCE SHAKE) LIQD, Drink 1-2  shakes per day (Patient not taking: Reported on 06/02/2018), Disp: 24 Bottle, Rfl: 12 .  PROAIR HFA 108 (90 Base) MCG/ACT inhaler, INHALE 2 PUFFS BY MOUTH EVERY 6 HOURS AS NEEDED FOR WHEEZING OR SHORTNESS OF BREATH, Disp: , Rfl: 3  Review of Systems  Constitutional: Positive for fatigue and malaise/fatigue. Negative for activity change, appetite change, chills, diaphoresis, fever, unexpected weight change and weight loss.  Respiratory: Positive for cough (Chronic issue). Negative for apnea, choking, chest tightness, shortness of breath, wheezing and stridor.   Cardiovascular: Negative for palpitations.  Gastrointestinal: Negative.  Negative for abdominal pain and anorexia.  Neurological: Negative for dizziness, light-headedness, headaches and paresthesias.  Hematological: Does not bruise/bleed easily.  Psychiatric/Behavioral: Negative for confusion. The patient has insomnia.     Social History   Tobacco Use  . Smoking status: Former Smoker    Packs/day: 0.25    Years: 45.00    Pack years: 11.25    Types: Cigarettes    Last attempt to quit: 03/17/2016    Years since quitting: 2.2  . Smokeless tobacco: Never Used  . Tobacco comment: pt states she quit in 2016-2017  Substance Use Topics  . Alcohol use: No    Alcohol/week: 0.0 standard drinks      Objective:   BP 108/80 (BP Location: Right Arm, Patient Position: Sitting, Cuff Size: Large)   Pulse 88   Temp 98.6 F (37 C) (Oral)   Resp 16   Wt 189 lb (85.7 kg)   BMI 39.50 kg/m  Vitals:   06/02/18 1113  BP: 108/80  Pulse: 88  Resp: 16  Temp: 98.6 F (37 C)  TempSrc: Oral  Weight: 189 lb (85.7 kg)     Physical Exam  General Appearance:    Alert, cooperative, no distress, obese  Eyes:    PERRL, conjunctiva/corneas clear, EOM's intact       Lungs:     Occasional expiratory rhonchi, respirations unlabored  Heart:    Regular rate and rhythm  Neurologic:   Awake, alert, oriented x 3. No apparent focal neurological            defect.          Assessment & Plan    1. Other fatigue  - Comprehensive metabolic panel - TSH - CBC  2. Type 2 diabetes mellitus without complication, without long-term current use of insulin (HCC)  - Hemoglobin A1c - Direct LDL  3. Insomnia, unspecified type Very little benefit from [place]. Try Rozerem.   4. Chronic cough refill - benzonatate (TESSALON PERLES) 100 MG capsule; Take 1 capsule (100 mg total) by mouth 3 (three) times daily as needed for cough.  Dispense: 60 capsule; Refill: 3  5. Atherosclerosis of coronary artery of native heart without angina pectoris, unspecified vessel or lesion type  - Direct LDL  6. Anxiety refill - clonazePAM (KLONOPIN) 0.5 MG tablet; Take 1 tablet (0.5 mg total) by mouth 2 (two) times daily as needed.  Dispense:  60 tablet; Refill: 2  7. Essential hypertension Well controlled.  Continue current medications.       Lelon Huh, MD  [facility]

## 2018-06-02 NOTE — Patient Instructions (Signed)
.   Please review the attached list of medications and notify my office if there are any errors.   . Please bring all of your medications to every appointment so we can make sure that our medication list is the same as yours.   

## 2018-06-03 LAB — COMPREHENSIVE METABOLIC PANEL
ALT: 13 IU/L (ref 0–32)
AST: 16 IU/L (ref 0–40)
Albumin/Globulin Ratio: 2.3 — ABNORMAL HIGH (ref 1.2–2.2)
Albumin: 4.8 g/dL (ref 3.8–4.8)
Alkaline Phosphatase: 91 IU/L (ref 39–117)
BUN/Creatinine Ratio: 15 (ref 12–28)
BUN: 10 mg/dL (ref 8–27)
Bilirubin Total: 0.3 mg/dL (ref 0.0–1.2)
CO2: 25 mmol/L (ref 20–29)
Calcium: 9.7 mg/dL (ref 8.7–10.3)
Chloride: 99 mmol/L (ref 96–106)
Creatinine, Ser: 0.66 mg/dL (ref 0.57–1.00)
GFR calc Af Amer: 110 mL/min/{1.73_m2} (ref >59)
GFR calc non Af Amer: 96 mL/min/{1.73_m2} (ref >59)
Globulin, Total: 2.1 g/dL (ref 1.5–4.5)
Glucose: 131 mg/dL — ABNORMAL HIGH (ref 65–99)
Potassium: 4.8 mmol/L (ref 3.5–5.2)
Sodium: 141 mmol/L (ref 134–144)
Total Protein: 6.9 g/dL (ref 6.0–8.5)

## 2018-06-03 LAB — TSH: TSH: 1.81 u[IU]/mL (ref 0.450–4.500)

## 2018-06-03 LAB — CBC
Hematocrit: 37.9 % (ref 34.0–46.6)
Hemoglobin: 12.6 g/dL (ref 11.1–15.9)
MCH: 29.9 pg (ref 26.6–33.0)
MCHC: 33.2 g/dL (ref 31.5–35.7)
MCV: 90 fL (ref 79–97)
Platelets: 241 10*3/uL (ref 150–450)
RBC: 4.21 x10E6/uL (ref 3.77–5.28)
RDW: 14.1 % (ref 11.7–15.4)
WBC: 7 10*3/uL (ref 3.4–10.8)

## 2018-06-03 LAB — HEMOGLOBIN A1C
Est. average glucose Bld gHb Est-mCnc: 137 mg/dL
Hgb A1c MFr Bld: 6.4 % — ABNORMAL HIGH (ref 4.8–5.6)

## 2018-06-03 LAB — LDL CHOLESTEROL, DIRECT: LDL Direct: 110 mg/dL — ABNORMAL HIGH (ref 0–99)

## 2018-06-05 ENCOUNTER — Telehealth: Payer: Self-pay

## 2018-06-05 NOTE — Telephone Encounter (Signed)
Please review. Thanks!  

## 2018-06-05 NOTE — Telephone Encounter (Signed)
Patient called back and wanted to know if she is supposed to be taking an aspirin daily?

## 2018-06-05 NOTE — Telephone Encounter (Signed)
Patient is calling with questions about her AVS from [place] on 06/02/2018. Patient states she has a heart diagnosis on her AVS that she is no aware of. She would like a call back today if possible. CB# [phone]

## 2018-06-05 NOTE — Telephone Encounter (Signed)
Left message to call back  

## 2018-06-08 NOTE — Telephone Encounter (Signed)
She does have some arthrosclerosis (small cholesterol plaques) that were seen on the CT scans of her chest. This is why we want her LDL cholesterol. To stay down and she needs to keep taking ezetimibe. It would be a good idea to take 81mg  coated aspirin every day.

## 2018-06-09 ENCOUNTER — Other Ambulatory Visit: Payer: Self-pay

## 2018-06-09 DIAGNOSIS — E781 Pure hyperglyceridemia: Secondary | ICD-10-CM

## 2018-06-09 DIAGNOSIS — E119 Type 2 diabetes mellitus without complications: Secondary | ICD-10-CM

## 2018-06-09 MED ORDER — EZETIMIBE 10 MG PO TABS: 10.0000 mg | ORAL_TABLET | Freq: Every day | ORAL | 12 refills | Status: DC

## 2018-06-09 NOTE — Telephone Encounter (Signed)
Pt advised.   Thanks,   -Johaan Ryser  

## 2018-06-16 DIAGNOSIS — Z1159 Encounter for screening for other viral diseases: Secondary | ICD-10-CM | POA: Diagnosis not present

## 2018-06-16 DIAGNOSIS — G8191 Hemiplegia, unspecified affecting right dominant side: Secondary | ICD-10-CM | POA: Diagnosis not present

## 2018-06-16 DIAGNOSIS — R4701 Aphasia: Secondary | ICD-10-CM | POA: Diagnosis not present

## 2018-06-16 DIAGNOSIS — J449 Chronic obstructive pulmonary disease, unspecified: Secondary | ICD-10-CM | POA: Diagnosis present

## 2018-06-16 DIAGNOSIS — Z888 Allergy status to other drugs, medicaments and biological substances status: Secondary | ICD-10-CM

## 2018-06-16 DIAGNOSIS — I4510 Unspecified right bundle-branch block: Secondary | ICD-10-CM | POA: Diagnosis not present

## 2018-06-16 DIAGNOSIS — J700 Acute pulmonary manifestations due to radiation: Secondary | ICD-10-CM | POA: Diagnosis not present

## 2018-06-16 DIAGNOSIS — E1151 Type 2 diabetes mellitus with diabetic peripheral angiopathy without gangrene: Secondary | ICD-10-CM | POA: Diagnosis present

## 2018-06-16 DIAGNOSIS — Z85118 Personal history of other malignant neoplasm of bronchus and lung: Secondary | ICD-10-CM

## 2018-06-16 DIAGNOSIS — Z7984 Long term (current) use of oral hypoglycemic drugs: Secondary | ICD-10-CM

## 2018-06-16 DIAGNOSIS — R29706 NIHSS score 6: Secondary | ICD-10-CM | POA: Diagnosis present

## 2018-06-16 DIAGNOSIS — Z9221 Personal history of antineoplastic chemotherapy: Secondary | ICD-10-CM

## 2018-06-16 DIAGNOSIS — Z923 Personal history of irradiation: Secondary | ICD-10-CM

## 2018-06-16 DIAGNOSIS — R1310 Dysphagia, unspecified: Secondary | ICD-10-CM | POA: Diagnosis present

## 2018-06-16 DIAGNOSIS — I639 Cerebral infarction, unspecified: Secondary | ICD-10-CM | POA: Diagnosis not present

## 2018-06-16 DIAGNOSIS — Z79899 Other long term (current) drug therapy: Secondary | ICD-10-CM

## 2018-06-16 DIAGNOSIS — C3490 Malignant neoplasm of unspecified part of unspecified bronchus or lung: Secondary | ICD-10-CM | POA: Diagnosis not present

## 2018-06-16 DIAGNOSIS — Y842 Radiological procedure and radiotherapy as the cause of abnormal reaction of the patient, or of later complication, without mention of misadventure at the time of the procedure: Secondary | ICD-10-CM | POA: Diagnosis present

## 2018-06-16 DIAGNOSIS — I10 Essential (primary) hypertension: Secondary | ICD-10-CM | POA: Diagnosis present

## 2018-06-16 DIAGNOSIS — E1142 Type 2 diabetes mellitus with diabetic polyneuropathy: Secondary | ICD-10-CM | POA: Diagnosis present

## 2018-06-16 DIAGNOSIS — F419 Anxiety disorder, unspecified: Secondary | ICD-10-CM | POA: Diagnosis present

## 2018-06-16 DIAGNOSIS — Z87891 Personal history of nicotine dependence: Secondary | ICD-10-CM

## 2018-06-16 DIAGNOSIS — K219 Gastro-esophageal reflux disease without esophagitis: Secondary | ICD-10-CM | POA: Diagnosis present

## 2018-06-16 DIAGNOSIS — Z20828 Contact with and (suspected) exposure to other viral communicable diseases: Secondary | ICD-10-CM | POA: Diagnosis not present
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
## 2018-06-17 ENCOUNTER — Other Ambulatory Visit: Payer: Self-pay

## 2018-06-17 ENCOUNTER — Inpatient Hospital Stay: Payer: Medicare Other

## 2018-06-17 ENCOUNTER — Emergency Department: Payer: Medicare Other

## 2018-06-17 ENCOUNTER — Inpatient Hospital Stay
Admission: EM | Admit: 2018-06-17 | Discharge: 2018-06-19 | DRG: 065 | Disposition: A | Discharge status: Rehabilitation Facility | Payer: Medicare Other | Attending: Internal Medicine | Admitting: Internal Medicine

## 2018-06-17 ENCOUNTER — Encounter: Payer: Self-pay | Admitting: Emergency Medicine

## 2018-06-17 DIAGNOSIS — J42 Unspecified chronic bronchitis: Secondary | ICD-10-CM | POA: Diagnosis not present

## 2018-06-17 DIAGNOSIS — R5381 Other malaise: Secondary | ICD-10-CM | POA: Diagnosis not present

## 2018-06-17 DIAGNOSIS — E1142 Type 2 diabetes mellitus with diabetic polyneuropathy: Secondary | ICD-10-CM | POA: Diagnosis present

## 2018-06-17 DIAGNOSIS — Z7984 Long term (current) use of oral hypoglycemic drugs: Secondary | ICD-10-CM | POA: Diagnosis not present

## 2018-06-17 DIAGNOSIS — E1140 Type 2 diabetes mellitus with diabetic neuropathy, unspecified: Secondary | ICD-10-CM | POA: Diagnosis not present

## 2018-06-17 DIAGNOSIS — M5442 Lumbago with sciatica, left side: Secondary | ICD-10-CM | POA: Diagnosis not present

## 2018-06-17 DIAGNOSIS — Z720 Tobacco use: Secondary | ICD-10-CM | POA: Diagnosis not present

## 2018-06-17 DIAGNOSIS — R1310 Dysphagia, unspecified: Secondary | ICD-10-CM | POA: Diagnosis present

## 2018-06-17 DIAGNOSIS — G8929 Other chronic pain: Secondary | ICD-10-CM

## 2018-06-17 DIAGNOSIS — I6359 Cerebral infarction due to unspecified occlusion or stenosis of other cerebral artery: Secondary | ICD-10-CM | POA: Diagnosis not present

## 2018-06-17 DIAGNOSIS — J449 Chronic obstructive pulmonary disease, unspecified: Secondary | ICD-10-CM | POA: Diagnosis not present

## 2018-06-17 DIAGNOSIS — I639 Cerebral infarction, unspecified: Secondary | ICD-10-CM

## 2018-06-17 DIAGNOSIS — F419 Anxiety disorder, unspecified: Secondary | ICD-10-CM | POA: Diagnosis present

## 2018-06-17 DIAGNOSIS — Y842 Radiological procedure and radiotherapy as the cause of abnormal reaction of the patient, or of later complication, without mention of misadventure at the time of the procedure: Secondary | ICD-10-CM | POA: Diagnosis present

## 2018-06-17 DIAGNOSIS — C3491 Malignant neoplasm of unspecified part of right bronchus or lung: Secondary | ICD-10-CM | POA: Diagnosis not present

## 2018-06-17 DIAGNOSIS — E1151 Type 2 diabetes mellitus with diabetic peripheral angiopathy without gangrene: Secondary | ICD-10-CM | POA: Diagnosis present

## 2018-06-17 DIAGNOSIS — I6930 Unspecified sequelae of cerebral infarction: Secondary | ICD-10-CM | POA: Diagnosis present

## 2018-06-17 DIAGNOSIS — R4701 Aphasia: Secondary | ICD-10-CM | POA: Diagnosis present

## 2018-06-17 DIAGNOSIS — Z888 Allergy status to other drugs, medicaments and biological substances status: Secondary | ICD-10-CM | POA: Diagnosis not present

## 2018-06-17 DIAGNOSIS — Z8673 Personal history of transient ischemic attack (TIA), and cerebral infarction without residual deficits: Secondary | ICD-10-CM | POA: Diagnosis not present

## 2018-06-17 DIAGNOSIS — I2510 Atherosclerotic heart disease of native coronary artery without angina pectoris: Secondary | ICD-10-CM | POA: Diagnosis not present

## 2018-06-17 DIAGNOSIS — R29706 NIHSS score 6: Secondary | ICD-10-CM | POA: Diagnosis present

## 2018-06-17 DIAGNOSIS — Z79899 Other long term (current) drug therapy: Secondary | ICD-10-CM | POA: Diagnosis not present

## 2018-06-17 DIAGNOSIS — K219 Gastro-esophageal reflux disease without esophagitis: Secondary | ICD-10-CM | POA: Diagnosis present

## 2018-06-17 DIAGNOSIS — M5441 Lumbago with sciatica, right side: Secondary | ICD-10-CM

## 2018-06-17 DIAGNOSIS — J700 Acute pulmonary manifestations due to radiation: Secondary | ICD-10-CM | POA: Diagnosis present

## 2018-06-17 DIAGNOSIS — Z85118 Personal history of other malignant neoplasm of bronchus and lung: Secondary | ICD-10-CM | POA: Diagnosis not present

## 2018-06-17 DIAGNOSIS — H409 Unspecified glaucoma: Secondary | ICD-10-CM | POA: Diagnosis not present

## 2018-06-17 DIAGNOSIS — Z87891 Personal history of nicotine dependence: Secondary | ICD-10-CM | POA: Diagnosis not present

## 2018-06-17 DIAGNOSIS — G8191 Hemiplegia, unspecified affecting right dominant side: Secondary | ICD-10-CM | POA: Diagnosis present

## 2018-06-17 DIAGNOSIS — J45998 Other asthma: Secondary | ICD-10-CM | POA: Diagnosis not present

## 2018-06-17 DIAGNOSIS — Z923 Personal history of irradiation: Secondary | ICD-10-CM | POA: Diagnosis not present

## 2018-06-17 DIAGNOSIS — E669 Obesity, unspecified: Secondary | ICD-10-CM | POA: Diagnosis not present

## 2018-06-17 DIAGNOSIS — J439 Emphysema, unspecified: Secondary | ICD-10-CM | POA: Diagnosis not present

## 2018-06-17 DIAGNOSIS — M6281 Muscle weakness (generalized): Secondary | ICD-10-CM | POA: Diagnosis not present

## 2018-06-17 DIAGNOSIS — Z7401 Bed confinement status: Secondary | ICD-10-CM | POA: Diagnosis not present

## 2018-06-17 DIAGNOSIS — I63341 Cerebral infarction due to thrombosis of right cerebellar artery: Secondary | ICD-10-CM | POA: Diagnosis not present

## 2018-06-17 DIAGNOSIS — M2550 Pain in unspecified joint: Secondary | ICD-10-CM | POA: Diagnosis not present

## 2018-06-17 DIAGNOSIS — I69359 Hemiplegia and hemiparesis following cerebral infarction affecting unspecified side: Secondary | ICD-10-CM | POA: Diagnosis not present

## 2018-06-17 DIAGNOSIS — E119 Type 2 diabetes mellitus without complications: Secondary | ICD-10-CM | POA: Diagnosis not present

## 2018-06-17 DIAGNOSIS — Z9221 Personal history of antineoplastic chemotherapy: Secondary | ICD-10-CM | POA: Diagnosis not present

## 2018-06-17 DIAGNOSIS — I10 Essential (primary) hypertension: Secondary | ICD-10-CM | POA: Diagnosis present

## 2018-06-17 DIAGNOSIS — C3490 Malignant neoplasm of unspecified part of unspecified bronchus or lung: Secondary | ICD-10-CM | POA: Diagnosis not present

## 2018-06-17 DIAGNOSIS — F411 Generalized anxiety disorder: Secondary | ICD-10-CM | POA: Diagnosis not present

## 2018-06-17 DIAGNOSIS — Z1159 Encounter for screening for other viral diseases: Secondary | ICD-10-CM | POA: Diagnosis not present

## 2018-06-17 DIAGNOSIS — H25813 Combined forms of age-related cataract, bilateral: Secondary | ICD-10-CM | POA: Diagnosis not present

## 2018-06-17 HISTORY — DX: Cerebral infarction, unspecified: I63.9

## 2018-06-17 LAB — URINALYSIS, ROUTINE W REFLEX MICROSCOPIC
Bilirubin Urine: NEGATIVE
Glucose, UA: NEGATIVE mg/dL
Hgb urine dipstick: NEGATIVE
Ketones, ur: 5 mg/dL — AB
Nitrite: NEGATIVE
Protein, ur: 30 mg/dL — AB
Specific Gravity, Urine: 1.02 (ref 1.005–1.030)
pH: 5 (ref 5.0–8.0)

## 2018-06-17 LAB — PROTIME-INR
INR: 1 (ref 0.8–1.2)
Prothrombin Time: 13.3 seconds (ref 11.4–15.2)

## 2018-06-17 LAB — URINE DRUG SCREEN, QUALITATIVE (ARMC ONLY)
Amphetamines, Ur Screen: NOT DETECTED
Barbiturates, Ur Screen: NOT DETECTED
Benzodiazepine, Ur Scrn: NOT DETECTED
Cannabinoid 50 Ng, Ur ~~LOC~~: NOT DETECTED
Cocaine Metabolite,Ur ~~LOC~~: NOT DETECTED
MDMA (Ecstasy)Ur Screen: NOT DETECTED
Methadone Scn, Ur: NOT DETECTED
Opiate, Ur Screen: NOT DETECTED
Phencyclidine (PCP) Ur S: NOT DETECTED
Tricyclic, Ur Screen: NOT DETECTED

## 2018-06-17 LAB — GLUCOSE, CAPILLARY
Glucose-Capillary: 121 mg/dL — ABNORMAL HIGH (ref 70–99)
Glucose-Capillary: 133 mg/dL — ABNORMAL HIGH (ref 70–99)
Glucose-Capillary: 148 mg/dL — ABNORMAL HIGH (ref 70–99)
Glucose-Capillary: 150 mg/dL — ABNORMAL HIGH (ref 70–99)
Glucose-Capillary: 157 mg/dL — ABNORMAL HIGH (ref 70–99)

## 2018-06-17 LAB — COMPREHENSIVE METABOLIC PANEL
ALT: 15 U/L (ref 0–44)
AST: 21 U/L (ref 15–41)
Albumin: 4.3 g/dL (ref 3.5–5.0)
Alkaline Phosphatase: 77 U/L (ref 38–126)
Anion gap: 13 (ref 5–15)
BUN: 12 mg/dL (ref 8–23)
CO2: 19 mmol/L — ABNORMAL LOW (ref 22–32)
Calcium: 9.2 mg/dL (ref 8.9–10.3)
Chloride: 107 mmol/L (ref 98–111)
Creatinine, Ser: 0.67 mg/dL (ref 0.44–1.00)
GFR calc Af Amer: >60 mL/min (ref >60)
GFR calc non Af Amer: >60 mL/min (ref >60)
Glucose, Bld: 161 mg/dL — ABNORMAL HIGH (ref 70–99)
Potassium: 3.6 mmol/L (ref 3.5–5.1)
Sodium: 139 mmol/L (ref 135–145)
Total Bilirubin: 0.7 mg/dL (ref 0.3–1.2)
Total Protein: 7.8 g/dL (ref 6.5–8.1)

## 2018-06-17 LAB — DIFFERENTIAL
Abs Immature Granulocytes: 0.02 10*3/uL (ref 0.00–0.07)
Basophils Absolute: 0 10*3/uL (ref 0.0–0.1)
Basophils Relative: 0 %
Eosinophils Absolute: 0.4 10*3/uL (ref 0.0–0.5)
Eosinophils Relative: 5 %
Immature Granulocytes: 0 %
Lymphocytes Relative: 14 %
Lymphs Abs: 1.3 10*3/uL (ref 0.7–4.0)
Monocytes Absolute: 0.5 10*3/uL (ref 0.1–1.0)
Monocytes Relative: 5 %
Neutro Abs: 7.2 10*3/uL (ref 1.7–7.7)
Neutrophils Relative %: 76 %

## 2018-06-17 LAB — SARS CORONAVIRUS 2 BY RT PCR (HOSPITAL ORDER, PERFORMED IN ~~LOC~~ HOSPITAL LAB): SARS Coronavirus 2: NEGATIVE

## 2018-06-17 LAB — LIPID PANEL
Cholesterol: 185 mg/dL (ref 0–200)
HDL: 44 mg/dL (ref >40)
LDL Cholesterol: 75 mg/dL (ref 0–99)
Total CHOL/HDL Ratio: 4.2 RATIO
Triglycerides: 330 mg/dL — ABNORMAL HIGH (ref <150)
VLDL: 66 mg/dL — ABNORMAL HIGH (ref 0–40)

## 2018-06-17 LAB — CBC
HCT: 40.4 % (ref 36.0–46.0)
Hemoglobin: 13 g/dL (ref 12.0–15.0)
MCH: 30.3 pg (ref 26.0–34.0)
MCHC: 32.2 g/dL (ref 30.0–36.0)
MCV: 94.2 fL (ref 80.0–100.0)
Platelets: 295 10*3/uL (ref 150–400)
RBC: 4.29 MIL/uL (ref 3.87–5.11)
RDW: 14.3 % (ref 11.5–15.5)
WBC: 9.5 10*3/uL (ref 4.0–10.5)
nRBC: 0 % (ref 0.0–0.2)

## 2018-06-17 LAB — ETHANOL: Alcohol, Ethyl (B): <10 mg/dL (ref <10)

## 2018-06-17 LAB — HEMOGLOBIN A1C
Hgb A1c MFr Bld: 6.3 % — ABNORMAL HIGH (ref 4.8–5.6)
Mean Plasma Glucose: 134.11 mg/dL

## 2018-06-17 LAB — APTT: aPTT: <24 seconds — ABNORMAL LOW (ref 24–36)

## 2018-06-17 LAB — TROPONIN I: Troponin I: <0.03 ng/mL (ref <0.03)

## 2018-06-17 MED ORDER — GABAPENTIN 300 MG PO CAPS
300.0000 mg | ORAL_CAPSULE | Freq: Three times a day (TID) | ORAL | Status: DC
  Administered 2018-06-17 – 2018-06-19 (×6): 300 mg via ORAL
  Filled 2018-06-17 (×7): qty 1

## 2018-06-17 MED ORDER — OXYCODONE-ACETAMINOPHEN 10-325 MG PO TABS: 1.0000 | ORAL_TABLET | Freq: Four times a day (QID) | ORAL | Status: DC | PRN

## 2018-06-17 MED ORDER — INSULIN ASPART 100 UNIT/ML ~~LOC~~ SOLN
0.0000 [IU] | Freq: Three times a day (TID) | SUBCUTANEOUS | Status: DC
  Administered 2018-06-17 – 2018-06-19 (×3): 1 [IU] via SUBCUTANEOUS
  Filled 2018-06-17 (×3): qty 1

## 2018-06-17 MED ORDER — ENOXAPARIN SODIUM 40 MG/0.4ML ~~LOC~~ SOLN
40.0000 mg | SUBCUTANEOUS | Status: DC
  Administered 2018-06-17 – 2018-06-19 (×3): 40 mg via SUBCUTANEOUS
  Filled 2018-06-17 (×3): qty 0.4

## 2018-06-17 MED ORDER — STROKE: EARLY STAGES OF RECOVERY BOOK
Freq: Once | Status: AC
  Administered 2018-06-17: 05:00:00

## 2018-06-17 MED ORDER — ONDANSETRON HCL 4 MG/2ML IJ SOLN
4.0000 mg | Freq: Once | INTRAMUSCULAR | Status: AC
  Administered 2018-06-17: 4 mg via INTRAVENOUS
  Filled 2018-06-17: qty 2

## 2018-06-17 MED ORDER — ACETAMINOPHEN 160 MG/5ML PO SOLN
650.0000 mg | ORAL | Status: DC | PRN
  Filled 2018-06-17: qty 20.3

## 2018-06-17 MED ORDER — KETOROLAC TROMETHAMINE 15 MG/ML IJ SOLN
15.0000 mg | Freq: Four times a day (QID) | INTRAMUSCULAR | Status: DC | PRN
  Administered 2018-06-18: 15 mg via INTRAVENOUS
  Filled 2018-06-17 (×2): qty 1

## 2018-06-17 MED ORDER — OXYCODONE-ACETAMINOPHEN 5-325 MG PO TABS
1.0000 | ORAL_TABLET | Freq: Four times a day (QID) | ORAL | Status: DC | PRN
  Administered 2018-06-17 – 2018-06-19 (×5): 1 via ORAL
  Filled 2018-06-17 (×5): qty 1

## 2018-06-17 MED ORDER — ASPIRIN 81 MG PO CHEW
324.0000 mg | CHEWABLE_TABLET | Freq: Once | ORAL | Status: DC
  Filled 2018-06-17: qty 4

## 2018-06-17 MED ORDER — GADOBUTROL 1 MMOL/ML IV SOLN: 8.0000 mL | Freq: Once | INTRAVENOUS | Status: DC | PRN

## 2018-06-17 MED ORDER — ACETAMINOPHEN 650 MG RE SUPP: 650.0000 mg | RECTAL | Status: DC | PRN

## 2018-06-17 MED ORDER — RAMELTEON 8 MG PO TABS
8.0000 mg | ORAL_TABLET | Freq: Every day | ORAL | Status: DC
  Administered 2018-06-17 – 2018-06-18 (×2): 8 mg via ORAL
  Filled 2018-06-17 (×3): qty 1

## 2018-06-17 MED ORDER — ORAL CARE MOUTH RINSE
15.0000 mL | Freq: Two times a day (BID) | OROMUCOSAL | Status: DC
  Administered 2018-06-17 – 2018-06-19 (×3): 15 mL via OROMUCOSAL

## 2018-06-17 MED ORDER — ASPIRIN EC 325 MG PO TBEC: 325.0000 mg | DELAYED_RELEASE_TABLET | Freq: Every day | ORAL | Status: DC

## 2018-06-17 MED ORDER — ASPIRIN 300 MG RE SUPP
300.0000 mg | Freq: Once | RECTAL | Status: AC
  Administered 2018-06-17: 300 mg via RECTAL

## 2018-06-17 MED ORDER — FENTANYL CITRATE (PF) 100 MCG/2ML IJ SOLN
50.0000 ug | Freq: Once | INTRAMUSCULAR | Status: AC
  Administered 2018-06-17: 02:00:00 50 ug via INTRAVENOUS
  Filled 2018-06-17: qty 2

## 2018-06-17 MED ORDER — ASPIRIN 81 MG PO CHEW
81.0000 mg | CHEWABLE_TABLET | Freq: Every day | ORAL | Status: DC
  Administered 2018-06-18 – 2018-06-19 (×2): 81 mg via ORAL
  Filled 2018-06-17 (×2): qty 1

## 2018-06-17 MED ORDER — SENNOSIDES-DOCUSATE SODIUM 8.6-50 MG PO TABS: 1.0000 | ORAL_TABLET | Freq: Every evening | ORAL | Status: DC | PRN

## 2018-06-17 MED ORDER — UMECLIDINIUM-VILANTEROL 62.5-25 MCG/INH IN AEPB
1.0000 | INHALATION_SPRAY | Freq: Every day | RESPIRATORY_TRACT | Status: DC
  Administered 2018-06-17 – 2018-06-19 (×3): 1 via RESPIRATORY_TRACT
  Filled 2018-06-17: qty 14

## 2018-06-17 MED ORDER — ACETAMINOPHEN 325 MG PO TABS: 650.0000 mg | ORAL_TABLET | ORAL | Status: DC | PRN

## 2018-06-17 MED ORDER — INSULIN ASPART 100 UNIT/ML ~~LOC~~ SOLN: 0.0000 [IU] | Freq: Every day | SUBCUTANEOUS | Status: DC

## 2018-06-17 MED ORDER — ALBUTEROL SULFATE (2.5 MG/3ML) 0.083% IN NEBU: 2.5000 mg | INHALATION_SOLUTION | Freq: Four times a day (QID) | RESPIRATORY_TRACT | Status: DC | PRN

## 2018-06-17 MED ORDER — OXYCODONE HCL 5 MG PO TABS
5.0000 mg | ORAL_TABLET | Freq: Four times a day (QID) | ORAL | Status: DC | PRN
  Administered 2018-06-17 – 2018-06-19 (×6): 5 mg via ORAL
  Filled 2018-06-17 (×6): qty 1

## 2018-06-17 MED ORDER — CLONAZEPAM 0.5 MG PO TABS
0.5000 mg | ORAL_TABLET | Freq: Two times a day (BID) | ORAL | Status: DC
  Administered 2018-06-17 – 2018-06-19 (×5): 0.5 mg via ORAL
  Filled 2018-06-17 (×5): qty 1

## 2018-06-17 MED ORDER — SODIUM CHLORIDE 0.9 % IV SOLN
INTRAVENOUS | Status: DC
  Administered 2018-06-17 – 2018-06-18 (×4): via INTRAVENOUS

## 2018-06-17 MED ORDER — CLOPIDOGREL BISULFATE 75 MG PO TABS
75.0000 mg | ORAL_TABLET | Freq: Every day | ORAL | Status: DC
  Administered 2018-06-17 – 2018-06-19 (×3): 75 mg via ORAL
  Filled 2018-06-17 (×3): qty 1

## 2018-06-17 MED ORDER — METFORMIN HCL ER 500 MG PO TB24
1000.0000 mg | ORAL_TABLET | Freq: Every day | ORAL | Status: DC
  Administered 2018-06-18 – 2018-06-19 (×2): 1000 mg via ORAL
  Filled 2018-06-17 (×2): qty 2

## 2018-06-17 MED ORDER — KETOROLAC TROMETHAMINE 15 MG/ML IJ SOLN
15.0000 mg | Freq: Four times a day (QID) | INTRAMUSCULAR | Status: DC | PRN
  Administered 2018-06-17: 09:00:00 15 mg via INTRAVENOUS
  Filled 2018-06-17 (×2): qty 1

## 2018-06-17 NOTE — Progress Notes (Signed)
Inpatient Rehabilitation Admissions Coordinator  Received an inpt rehab consult. I reviewed chart and contacted patient by phone for rehab assessment. I dicussed goals and expectations of an inpt rehab admit. Feel patient could benefit from an inpt rehab admit at [place]. Patient requesting to stay in [facility] to be close to family. I reiterated that no visitors are allowed at [facility] facilities as well as [facility]. Her son lives with her and is 76 years old. She states she worries about him. I offered [facility] inpt rehab, but patient requesting SNF in [place] except for [facility]. I contacted SW, Candace, of patient preference.   Danne Baxter, RN, MSN Rehab Admissions Coordinator [phone] 06/17/2018 4:05 PM

## 2018-06-17 NOTE — Consult Note (Signed)
[facility] TeleNeurology Consult Services   Date of Service:   06/17/2018 00:09:11  Impression:     .  Posterior Circulation Infarct  Comments/Sign-Out: 63 y/o woman presents to the ED with gait difficulty x 2 days. Also has headache since 2200 last night. CT head shows subacute cerebrallar stroke. Patient to be admitted for further evaluation  Metrics: Last Known Well: 06/13/2018 - Saturday [employer] Notification Time: 06/17/2018 00:09:11 Arrival Time: 06/17/2018 00:01:00 Stamp Time: 06/17/2018 00:09:11 Time First Login Attempt: 06/17/2018 00:16:39 Video Start Time: 06/17/2018 00:16:39  Symptoms: headache NIHSS Start Assessment Time: 06/17/2018 00:17:10 Patient is not a candidate for tPA. Patient was not deemed candidate for tPA thrombolytics because of Subacute stroke on CT and gait difficulty x 2 days. Video End Time: 06/17/2018 00:31:56  CT head was reviewed.  Clinical Presentation is not Suggestive of Large Vessel Occlusive Disease  ED Physician notified of diagnostic impression and management plan on 06/17/2018 00:31:11  Our recommendations are outlined below.  Recommendations:     .  Activate Stroke Protocol Admission/Order Set     .  Stroke/Telemetry Floor     .  Neuro Checks     .  Bedside Swallow Eval     .  DVT Prophylaxis     .  IV Fluids, Normal Saline     .  Head of Bed 30 Degrees     .  Euglycemia and Avoid Hyperthermia (PRN Acetaminophen)     .  ASA if no contraindication  Routine Consultation with [facility] for Follow up Care  Sign Out:     .  Discussed with Emergency Department Provider    ------------------------------------------------------------------------------  History of Present Illness: Patient is a 63 year old Female.  Patient was brought by private transportation with symptoms of headache  63 y/o woman with h/o lung cancer. DM who who presents to ED with sudden onset headache at 2200. Patient  states she has gait difficulty x 2 days. Patient states last known normal was Saturday 5/16. Patient has been falling for 3 years, but her gait is worse the past 2 days Emergent telestroke consult requested. CT head reviewed and case discussed with ED staff. Possible subacute stroke on right cerebellum.   Examination: 1A: Level of Consciousness - Alert; keenly responsive + 0 1B: Ask Month and Age - Both Questions Right + 0 1C: Blink Eyes & Squeeze Hands - Performs Both Tasks + 0 2: Test Horizontal Extraocular Movements - Normal + 0 3: Test Visual Fields - No Visual Loss + 0 4: Test Facial Palsy (Use Grimace if Obtunded) - Normal symmetry + 0 5A: Test Left Arm Motor Drift - No Drift for 10 Seconds + 0 5B: Test Right Arm Motor Drift - No Drift for 10 Seconds + 0 6A: Test Left Leg Motor Drift - No Drift for 5 Seconds + 0 6B: Test Right Leg Motor Drift - No Drift for 5 Seconds + 0 7: Test Limb Ataxia (FNF/Heel-Shin) - Ataxia in 1 Limb + 1 8: Test Sensation - Mild-Moderate Loss: Less Sharp/More Dull + 1 9: Test Language/Aphasia - Normal; No aphasia + 0 10: Test Dysarthria - Normal + 0 11: Test Extinction/Inattention - No abnormality + 0  NIHSS Score: 2  Patient/Family was informed the Neurology Consult would happen via TeleHealth consult by way of interactive audio and video telecommunications and consented to receiving care in this manner.  Due to the immediate potential for life-threatening deterioration due to underlying acute neurologic illness, I  spent 15 minutes providing critical care. This time includes time for face to face visit via telemedicine, review of medical records, imaging studies and discussion of findings with providers, the patient and/or family.   Dr Meryl Crutch   [facility] [phone]   Case [id number]

## 2018-06-17 NOTE — ED Notes (Signed)
To CT via stretcher accomp by RN

## 2018-06-17 NOTE — ED Triage Notes (Signed)
Patient arrives via POV with R headache, R eye blurred vision, slurred speech, slight facial droop. Code stroke activated

## 2018-06-17 NOTE — Progress Notes (Signed)
Rehab Admissions Coordinator Note:  Patient was screened by Michel Santee, PT, DPT for appropriateness for an Inpatient Acute Rehab Consult.  At this time, we are recommending Inpatient Rehab consult.  I will contact MD for order.   Michel Santee, PT, DPT 06/17/2018, 10:58 AM  I can be reached at [phone].

## 2018-06-17 NOTE — Consult Note (Signed)
Referring Physician: Tressia Miners    Chief Complaint: Blurry vision, gait abnormalities.    HPI: Yvette Riley is an 63 y.o. female with a history of lung cancer s/p chemo and radiation who presents with complaints of blurry vision, gait imbalance, dizziness and nausea/vomiting.  Symptoms began on 5/16 when she began to have difficulty with gait.  On 5/19 at 2200 developed headache.  Early in the AM on 5/20 developed the remaining symptoms of nausea, vomiting and blurry vision.  Patient reports improvement somewhat today.  Able to eat breakfast.  Initial NIHSS of 6.     Date last known well: Date: 06/13/2018 Time last known well: Unable to determine tPA Given: No: Outside time window  Past Medical History:  Diagnosis Date  . Allergy   . Anemia   . Anxiety   . Arthritis   . Asthma   . Blood transfusion without reported diagnosis   . C. difficile diarrhea 04/07/2016  . CAP (community acquired pneumonia) 03/21/2015  . Combined forms of age-related cataract of both eyes 05/06/2016  . COPD (chronic obstructive pulmonary disease) ([place])   . CVA (cerebral vascular accident) ([place]) 06/17/2018  . Diabetes mellitus without complication ([place])   . Displacement of lumbar intervertebral disc without myelopathy   . Emphysema of lung ([place])   . GERD (gastroesophageal reflux disease)   . Glaucoma   . History of chicken pox   . Hypertension   . Pneumonia 03/29/2015  . Shortness of breath   . Small cell lung cancer [facility])     Past Surgical History:  Procedure Laterality Date  . CESAREAN SECTION    . TUBAL LIGATION      Family History  Problem Relation Age of Onset  . Heart failure Mother   . Heart disease Mother   . Stroke Mother   . Heart disease Brother   . COPD Brother   . Cancer Maternal Aunt        Breast Cancer   Social History:  reports that she quit smoking about 2 years ago. Her smoking use included cigarettes. She has a 11.25 pack-year smoking history. She has never used smokeless  tobacco. She reports that she does not drink alcohol or use drugs.  Allergies:  Allergies  Allergen Reactions  . Codeine Anaphylaxis  . Crestor [Rosuvastatin] Anaphylaxis  . Other Anaphylaxis  . Penicillins Anaphylaxis, Rash and Other (See Comments)    Reaction:  Tongue swelling  Has patient had a PCN reaction causing immediate rash, facial/tongue/throat swelling, SOB or lightheadedness with hypotension:  Yes   Has patient had a PCN reaction causing severe rash involving mucus membranes or skin necrosis: No Has patient had a PCN reaction that required hospitalization No Has patient had a PCN reaction occurring within the last 10 years: No If all of the above answers are "NO", then may proceed with Cephalosporin use.  . [place] Venom [Bee Venom] Anaphylaxis  . Gemfibrozil Rash, Nausea And Vomiting and Swelling  . Statins Nausea And Vomiting and Swelling  . Trazodone And Nefazodone Nausea And Vomiting  . Lipitor [Atorvastatin] Rash    Medications:  I have reviewed the patient's current medications. Prior to Admission:  Medications Prior to Admission  Medication Sig Dispense Refill Last Dose  . clonazePAM (KLONOPIN) 0.5 MG tablet Take 1 tablet (0.5 mg total) by mouth 2 (two) times daily as needed. 60 tablet 2 06/16/2018 at 2000  . ezetimibe (ZETIA) 10 MG tablet Take 1 tablet (10 mg total) by mouth daily.  30 tablet 12 06/16/2018 at 0800  . gabapentin (NEURONTIN) 300 MG capsule Take 1 capsule (300 mg total) by mouth 5 (five) times daily. 150 capsule 3 06/16/2018 at 2000  . meloxicam (MOBIC) 15 MG tablet TAKE 1 TABLET BY MOUTH ONCE DAILY AS NEEDED FOR PAIN (Patient taking differently: Take 15 mg by mouth daily. ) 90 tablet 3 06/16/2018 at 0800  . metFORMIN (GLUCOPHAGE XR) 500 MG 24 hr tablet Take 2 tablets (1,000 mg total) by mouth daily with breakfast. With meals (Patient taking differently: Take 500 mg by mouth 2 (two) times daily with a meal. With meals) 60 tablet 3 06/16/2018 at 1800   . montelukast (SINGULAIR) 10 MG tablet Take 1 tablet (10 mg total) by mouth at bedtime. 90 tablet 3 06/16/2018 at 2000  . ramelteon (ROZEREM) 8 MG tablet Take 1 tablet (8 mg total) by mouth at bedtime. 30 tablet 3 06/16/2018 at 2000  . zaleplon (SONATA) 10 MG capsule TAKE 1 CAPSULE BY MOUTH IN THE EVENING. MAY TAKE SECOND CAPSULE AFTER 2 HOURS IF NEEDED 30 capsule 3 06/16/2018 at 2000  . albuterol (PROVENTIL) (2.5 MG/3ML) 0.083% nebulizer solution Take 3 mLs (2.5 mg total) by nebulization every 6 (six) hours as needed for wheezing or shortness of breath. 75 mL 3 prn at prn  . benzonatate (TESSALON PERLES) 100 MG capsule Take 1 capsule (100 mg total) by mouth 3 (three) times daily as needed for cough. 60 capsule 3 prn at prn  . glucose blood test strip Contour test strip. Use to check blood sugar once a day for for type 2 diabetes (E11.9) 100 each 4 Taking  . Lancets MISC Use to check blood sugar once daily for type 2 diabetes E11.9 100 each 4 Taking  . NARCAN 4 MG/0.1ML LIQD nasal spray kit Place 0.4 mg into the nose once.   99 prn at prn  . Nutritional Supplements (GLUCERNA ADVANCE SHAKE) LIQD Drink 1-2 shakes per day (Patient not taking: Reported on 06/02/2018) 24 Bottle 12 Not Taking  . oxyCODONE-acetaminophen (PERCOCET) 10-325 MG tablet One tablet every 4-6 hours as needed. 180 tablet 0 Taking  . PROAIR HFA 108 (90 Base) MCG/ACT inhaler INHALE 2 PUFFS BY MOUTH EVERY 6 HOURS AS NEEDED FOR WHEEZING OR SHORTNESS OF BREATH  3 prn at prn  . umeclidinium-vilanterol (ANORO ELLIPTA) 62.5-25 MCG/INH AEPB Inhale 1 puff into the lungs daily. 30 each 12 Taking   Scheduled: . clonazePAM  0.5 mg Oral BID  . enoxaparin (LOVENOX) injection  40 mg Subcutaneous Q24H  . gabapentin  300 mg Oral TID  . insulin aspart  0-5 Units Subcutaneous QHS  . insulin aspart  0-9 Units Subcutaneous TID WC  . [START ON 06/18/2018] metFORMIN  1,000 mg Oral Q breakfast  . ramelteon  8 mg Oral QHS  . umeclidinium-vilanterol  1 puff  Inhalation Daily    ROS: History obtained from the patient  General ROS: negative for - chills, fatigue, fever, night sweats, weight gain or weight loss Psychological ROS: negative for - behavioral disorder, hallucinations, memory difficulties, mood swings or suicidal ideation Ophthalmic ROS: as noted in HPI ENT ROS: as noted in HPI Allergy and Immunology ROS: negative for - hives or itchy/watery eyes Hematological and Lymphatic ROS: negative for - bleeding problems, bruising or swollen lymph nodes Endocrine ROS: negative for - galactorrhea, hair pattern changes, polydipsia/polyuria or temperature intolerance Respiratory ROS: negative for - cough, hemoptysis, shortness of breath or wheezing Cardiovascular ROS: negative for - chest pain, dyspnea on  exertion, edema or irregular heartbeat Gastrointestinal ROS: as noted in HPIGenito-Urinary ROS: negative for - dysuria, hematuria, incontinence or urinary frequency/urgency Musculoskeletal ROS: negative for - joint swelling or muscular weakness Neurological ROS: as noted in HPI Dermatological ROS: negative for rash and skin lesion changes  Physical Examination: Blood pressure (!) 121/96, pulse 98, temperature 97.8 F (36.6 C), temperature source Oral, resp. rate 18, height '4\' 11"'$  (1.499 m), weight 81.6 kg, SpO2 98 %.  HEENT-  Normocephalic, no lesions, without obvious abnormality.  Normal external eye and conjunctiva.  Normal TM's bilaterally.  Normal auditory canals and external ears. Normal external nose, mucus membranes and septum.  Normal pharynx. Cardiovascular- S1, S2 normal, pulses palpable throughout   Lungs- chest clear, no wheezing, rales, normal symmetric air entry Abdomen- soft, non-tender; bowel sounds normal; no masses,  no organomegaly Extremities- no edema Lymph-no adenopathy palpable Musculoskeletal-no joint tenderness, deformity or swelling Skin-warm and dry, no hyperpigmentation, vitiligo, or suspicious  lesions  Neurological Examination   Mental Status: Alert, oriented, thought content appropriate.  Speech fluent without evidence of aphasia but hoarse.  Able to follow 3 step commands without difficulty. Cranial Nerves: II: Discs flat bilaterally; Visual fields grossly normal, pupils equal, round, reactive to light and accommodation III,IV, VI: ptosis not present, disconjugate ocular movements noted V,VII: smile symmetric, facial light touch sensation decreased on the right VIII: hearing normal bilaterally IX,X: gag reflex present XI: bilateral shoulder shrug XII: midline tongue extension Motor: Right : Upper extremity   4/5    Left:     Upper extremity   5/5  Lower extremity   4/5     Lower extremity   5/5 Tone and bulk:normal tone throughout; no atrophy noted Sensory: Pinprick and light touch decreased on the right Deep Tendon Reflexes: Symmetric throughout Plantars: Right: mute   Left: mute Cerebellar: Dysmetria with finger-to-nose testing on the right Gait: not tested due to safety concerns   Laboratory Studies:  Basic Metabolic Panel: Recent Labs  Lab 06/17/18 0019  NA 139  K 3.6  CL 107  CO2 19*  GLUCOSE 161*  BUN 12  CREATININE 0.67  CALCIUM 9.2    Liver Function Tests: Recent Labs  Lab 06/17/18 0019  AST 21  ALT 15  ALKPHOS 77  BILITOT 0.7  PROT 7.8  ALBUMIN 4.3   No results for input(s): LIPASE, AMYLASE in the last 168 hours. No results for input(s): AMMONIA in the last 168 hours.  CBC: Recent Labs  Lab 06/17/18 0019  WBC 9.5  NEUTROABS 7.2  HGB 13.0  HCT 40.4  MCV 94.2  PLT 295    Cardiac Enzymes: Recent Labs  Lab 06/17/18 0019  TROPONINI <0.03    BNP: Invalid input(s): POCBNP  CBG: Recent Labs  Lab 06/17/18 0012 06/17/18 0737  GLUCAP 157* 121*    Microbiology: Results for orders placed or performed during the hospital encounter of 06/17/18  SARS Coronavirus 2 (CEPHEID - Performed in [place] hospital lab), Hosp Order      Status: None   Collection Time: 06/17/18 12:19 AM  Result Value Ref Range Status   SARS Coronavirus 2 NEGATIVE NEGATIVE Final    Comment: (NOTE) If result is NEGATIVE SARS-CoV-2 target nucleic acids are NOT DETECTED. The SARS-CoV-2 RNA is generally detectable in upper and lower  respiratory specimens during the acute phase of infection. The lowest  concentration of SARS-CoV-2 viral copies this assay can detect is 250  copies / mL. A negative result does not preclude SARS-CoV-2 infection  and should not be used as the sole basis for treatment or other  patient management decisions.  A negative result may occur with  improper specimen collection / handling, submission of specimen other  than nasopharyngeal swab, presence of viral mutation(s) within the  areas targeted by this assay, and inadequate number of viral copies  (<250 copies / mL). A negative result must be combined with clinical  observations, patient history, and epidemiological information. If result is POSITIVE SARS-CoV-2 target nucleic acids are DETECTED. The SARS-CoV-2 RNA is generally detectable in upper and lower  respiratory specimens dur ing the acute phase of infection.  Positive  results are indicative of active infection with SARS-CoV-2.  Clinical  correlation with patient history and other diagnostic information is  necessary to determine patient infection status.  Positive results do  not rule out bacterial infection or co-infection with other viruses. If result is PRESUMPTIVE POSTIVE SARS-CoV-2 nucleic acids MAY BE PRESENT.   A presumptive positive result was obtained on the submitted specimen  and confirmed on repeat testing.  While 2019 novel coronavirus  (SARS-CoV-2) nucleic acids may be present in the submitted sample  additional confirmatory testing may be necessary for epidemiological  and / or clinical management purposes  to differentiate between  SARS-CoV-2 and other Sarbecovirus currently known to  infect humans.  If clinically indicated additional testing with an alternate test  methodology [phone]) is advised. The SARS-CoV-2 RNA is generally  detectable in upper and lower respiratory sp ecimens during the acute  phase of infection. The expected result is Negative. Fact Sheet for Patients:  StrictlyIdeas.no Fact Sheet for Healthcare Providers: BankingDealers.co.za This test is not yet approved or cleared by the [place] FDA and has been authorized for detection and/or diagnosis of SARS-CoV-2 by FDA under an Emergency Use Authorization (EUA).  This EUA will remain in effect (meaning this test can be used) for the duration of the COVID-19 declaration under Section 564(b)(1) of the Act, 21 U.S.C. section 360bbb-3(b)(1), unless the authorization is terminated or revoked sooner. Performed at [facility], [address] [place], [place] [address]     Coagulation Studies: Recent Labs    06/17/18 0219  LABPROT 13.3  INR 1.0    Urinalysis:  Recent Labs  Lab 06/17/18 0019  COLORURINE YELLOW*  LABSPEC 1.020  PHURINE 5.0  GLUCOSEU NEGATIVE  HGBUR NEGATIVE  BILIRUBINUR NEGATIVE  KETONESUR 5*  PROTEINUR 30*  NITRITE NEGATIVE  LEUKOCYTESUR SMALL*    Lipid Panel:    Component Value Date/Time   CHOL 185 06/17/2018 0514   CHOL 210 (H) 08/20/2017 1040   TRIG 330 (H) 06/17/2018 0514   HDL 44 06/17/2018 0514   HDL 52 08/20/2017 1040   CHOLHDL 4.2 06/17/2018 0514   VLDL 66 (H) 06/17/2018 0514   LDLCALC 75 06/17/2018 0514   LDLCALC 109 (H) 08/20/2017 1040    HgbA1C:  Lab Results  Component Value Date   HGBA1C 6.3 (H) 06/17/2018    Urine Drug Screen:      Component Value Date/Time   LABOPIA NONE DETECTED 06/17/2018 0019   COCAINSCRNUR NONE DETECTED 06/17/2018 0019   LABBENZ NONE DETECTED 06/17/2018 0019   AMPHETMU NONE DETECTED 06/17/2018 0019   THCU NONE DETECTED 06/17/2018 0019   LABBARB NONE  DETECTED 06/17/2018 0019    Alcohol Level:  Recent Labs  Lab 06/17/18 0019  ETH <10    Other results: EKG: sinus rhythm at 96 bpm.  Imaging: Dg Chest 1 View  Result Date: 06/17/2018  CLINICAL DATA:  63 y/o F; weakness. History of smoking, COPD, and lung cancer. EXAM: CHEST  1 VIEW COMPARISON:  03/05/2018 CT chest. FINDINGS: Stable cardiac silhouette given projection and technique. Prominent pericardial fat pads. Right hilar and upper lobe consolidation is stable compatible with radiation fibrosis. There are increased bronchitic changes in the left mid and lower lung zones. No pleural effusion or pneumothorax. No acute osseous abnormality is evident. IMPRESSION: Increased bronchitic changes in the left mid and lower lung zones. 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 Electronically Signed   By: Kristine Garbe M.D.   On: 06/17/2018 01:40   Mr Brain Wo Contrast  Result Date: 06/17/2018 CLINICAL DATA:  Vomiting and blurred vision EXAM: MRI HEAD WITHOUT CONTRAST TECHNIQUE: Multiplanar, multiecho pulse sequences of the brain and surrounding structures were obtained without intravenous contrast. COMPARISON:  Head CT from earlier today FINDINGS: Brain: Sizable acute infarct in the upper right cerebellum, superior cerebellar territory. No hemorrhagic conversion. No infarct in the separate distribution. There is history of lung cancer. No masslike findings. Mild chronic small vessel ischemic change in the periventricular white matter. No hydrocephalus, collection, or age advanced volume loss. Coronal T2 was not acquired/not available. Vascular: Major flow voids are preserved, including in the basilar. Skull and upper cervical spine: Negative for marrow lesion Sinuses/Orbits: Negative IMPRESSION: 1. Acute right superior cerebellar infarct. The fourth ventricle is widely patent. 2. Background of mild chronic small vessel ischemia. Electronically Signed   By: Monte Fantasia M.D.   On:  06/17/2018 10:34   Ct Head Code Stroke Wo Contrast  Addendum Date: 06/17/2018   ADDENDUM REPORT: 06/17/2018 00:31 ADDENDUM: Study discussed by telephone with Dr. Luvenia Starch SUNG on 06/17/2018 at 0028 hours. Electronically Signed   By: Genevie Ann M.D.   On: 06/17/2018 00:31   Result Date: 06/17/2018 CLINICAL DATA:  Code stroke. 63 year old female with slurred speech and eye pain onset 2 hours ago. History of small cell lung cancer. EXAM: CT HEAD WITHOUT CONTRAST TECHNIQUE: Contiguous axial images were obtained from the base of the skull through the vertex without intravenous contrast. COMPARISON:  Brain MRI 12/12/2016. Head CT 12/28/2015. FINDINGS: Brain: New wedge-shaped hypodensity in the right superior cerebellar artery territory best seen on coronal image 43. No associated hemorrhage. No posterior fossa mass effect. Other posterior fossa gray-white matter differentiation is within normal limits. Mild cerebral volume loss since 2017. No midline shift, or evidence of intracranial mass lesion. No ventriculomegaly. No acute intracranial hemorrhage identified. No cerebral cortically based infarct identified. No cortical encephalomalacia identified. Vascular: Calcified atherosclerosis at the skull base. No suspicious intracranial vascular hyperdensity. Skull: No acute osseous abnormality identified. Sinuses/Orbits: Improved sphenoid sinus aeration since 2017. Other Visualized paranasal sinuses and mastoids are stable and well pneumatized. Other: Mildly Disconjugate gaze. Negative visible scalp soft tissues. ASPECTS [facility] Stroke Program Early CT Score) - Ganglionic level infarction (caudate, lentiform nuclei, internal capsule, insula, M1-M3 cortex): 7 - Supraganglionic infarction (M4-M6 cortex): 3 Total score (0-10 with 10 being normal): 10 IMPRESSION: 1. Hypodensity in the right superior cerebellum most resembles an acute to subacute Right SCA territory infarct. No associated hemorrhage or mass effect. Given history of  small cell lung cancer recommend follow-up Brain MRI to confirm. 2. No supratentorial cortically based infarct identified. ASPECTS 10. Electronically Signed: By: Genevie Ann M.D. On: 06/17/2018 00:17    Assessment: 63 y.o. female presenting with gait abnormalities, dizziness, blurred vision and nausea/vomting.  MRI of the brain reviewed and reveals an acute right  superior cerebellar infarct with paten 4th ventricle.  Likely secondary to small vessel disease.  Symptoms per patient have progressed over the past 3-4 days likely suggesting some development of edema despite open 4th ventricle.  Patient on no antiplatelet or anticoagulation prior to presentation.  A1c 63.  LDL 75.  Remaining stroke work up pending.    Stroke Risk Factors - diabetes mellitus and hypertension  Plan: 1. Aggressive lipid management with target LDL<70. 2. PT consult, OT consult, Speech consult 3. Echocardiogram pending 4. Carotid dopplers pending 5. Prophylactic therapy-ASA '81mg'$  and Plavix 75 mg daily for one month with change to monotherapy with ASA '81mg'$  after that time. 6. NPO until RN stroke swallow screen 7. Frequent neuro checks 8. Telemetry monitoring 9. BP control   Alexis Goodell, MD Neurology [phone] 06/17/2018, 12:37 PM

## 2018-06-17 NOTE — ED Notes (Signed)
Lab called to draw blue top (PT/INR) due to multiple unsuccessful attempts

## 2018-06-17 NOTE — ED Notes (Addendum)
Pt in vehicle, actively vomiting, escorted by son--reports 2hrs36min PTA, pt eating when had sudden onset right sided HA and pain behind rt eye accomp by blurring of vision and nausea with dizziness and weakness, slurring of speech; hx lung CA, ?metastasis to brain, past chemo treatment; charge nurse called for exam room; pt assisted into w/c with weakness of legs bilat noted

## 2018-06-17 NOTE — Evaluation (Signed)
Physical Therapy Evaluation Patient Details Name: Yvette Riley MRN: [id number] DOB: [birth year] Today's Date: 06/17/2018   History of Present Illness  Patient is a pleasant 63 year old female who presented to [facility] for sudden vomiting and blurred vision with noticeable change in balance, imaging revelaed a subacute lesion in R superior cerebellar artery. Patient reports she has been unsteady for a few days with last "normal day" being 5/16 however has been unsteady with her balance for the past 3 years. Reports 5-6 falls in the past two months. PMh includes lung cancer (chemo and radiation), DM, HTN, COPD, anemia, anxiety, CVA, and glaucoma.  Clinical Impression  Patient is a pleasant 63 year old female who presents with R sided deficits s/p subacute lesion in R superior cerebellar artery. Patient is eager and willing to participate in therapy. Drifting of R UE and LE with attempted motion in open chain position occurs with patient frustrated with limb. Patient able to move RLE with additional time and poor coordination. Limited weight acceptance in standing noted however patient was unable to ambulate at this time due to HR >120 with standing marches. Patient unable to perform full march requiring Min A for stability due to RLE buckling. Patient returned to supine due to HR elevation, with 3-5 minutes before HR depressed.  Patient challenged with stability and mobility at this time placing her at a high fall risk. While patient is hospitalized, skilled PT will be beneficial to address limitations in balance, strength, mobility, transfers, and coordination. Upon discharge patient would benefit from placement in CIR to address above mentioned deficits, reduce falls risk, and improve quality of life with increased ability to interact with natural environment.     Follow Up Recommendations CIR    Equipment Recommendations  Other (comment)(none if going to CIR)    Recommendations for Other Services OT  consult     Precautions / Restrictions Precautions Precautions: Fall Restrictions Weight Bearing Restrictions: No      Mobility  Bed Mobility Overal bed mobility: Needs Assistance Bed Mobility: Supine to Sit;Sit to Supine     Supine to sit: Supervision;HOB elevated Sit to supine: Min assist;HOB elevated   General bed mobility comments: Patient requires extra time and slow scooting of R foot towards edge of bed,unable to lift and bring to side of bed. Requires min A to return R foot to bed.   Transfers Overall transfer level: Needs assistance   Transfers: Sit to/from Stand Sit to Stand: Min guard;Min assist         General transfer comment: patient very unsteady requiring cueing for hand placement and assistance to maintain standing due to drift. Unable to maintain WB on RLE. HR elevating.   Ambulation/Gait             General Gait Details: unable to perform due to HR>120 with standing marches and unstable  Stairs            Wheelchair Mobility    Modified Rankin (Stroke Patients Only)       Balance Overall balance assessment: Needs assistance;History of Falls Sitting-balance support: Bilateral upper extremity supported;Feet supported Sitting balance-Leahy Scale: Fair Sitting balance - Comments: Able to maintain with SBA with BUE support however requires occasional Min A when reaching with one UE Postural control: Left lateral lean;Posterior lean Standing balance support: Bilateral upper extremity supported Standing balance-Leahy Scale: Poor Standing balance comment: Patient is very unsteady in standing with RW with poor weight shift, requires BUE support  Pertinent Vitals/Pain Pain Assessment: 0-10 Pain Score: 4  Pain Location: back Pain Descriptors / Indicators: Aching;Discomfort Pain Intervention(s): Limited activity within patient's tolerance;Monitored during session;Repositioned    Home Living  Family/patient expects to be discharged to:: Inpatient rehab Living Arrangements: Children               Additional Comments: Per patient she lives a single level home with 2 steps to enter, 0 railing, has a tub/shower with no grab bars. Patient eager to participate in CIR to return to PLOF.     Prior Function Level of Independence: Needs assistance   Gait / Transfers Assistance Needed: uses a walker/cane at baseline for ambulation inside and outside home  ADL's / Homemaking Assistance Needed: son and daughter in law help with grocery shopping.   Comments: Patient      Hand Dominance   Dominant Hand: Right    Extremity/Trunk Assessment   Upper Extremity Assessment Upper Extremity Assessment: Defer to OT evaluation    Lower Extremity Assessment Lower Extremity Assessment: RLE deficits/detail;LLE deficits/detail RLE Deficits / Details: limited due to drifting of R foot/LE, grossly 3-/5 RLE Sensation: decreased light touch RLE Coordination: decreased gross motor LLE Deficits / Details: grossly 3+/5 LLE Sensation: WNL LLE Coordination: WNL       Communication   Communication: No difficulties  Cognition Arousal/Alertness: Awake/alert Behavior During Therapy: WFL for tasks assessed/performed Overall Cognitive Status: Within Functional Limits for tasks assessed                                 General Comments: alert and oriented. Understands deficits and eager to participate in rehab/CIR to return to PLOF.       General Comments General comments (skin integrity, edema, etc.): slight edema in BLE     Exercises Other Exercises Other Exercises: education on safe bed mobility and transfers for fall reduction Other Exercises: EOB stability and LE movement   Assessment/Plan    PT Assessment Patient needs continued PT services  PT Problem List Decreased strength;Decreased range of motion;Decreased activity tolerance;Decreased coordination;Decreased  mobility;Decreased balance;Impaired sensation;Cardiopulmonary status limiting activity       PT Treatment Interventions DME instruction;Gait training;Stair training;Therapeutic exercise;Therapeutic activities;Functional mobility training;Balance training;Neuromuscular re-education;Cognitive remediation;Manual techniques;Patient/family education    PT Goals (Current goals can be found in the Care Plan section)  Acute Rehab PT Goals Patient Stated Goal: to go to CIR and walk again  PT Goal Formulation: With patient Time For Goal Achievement: 07/01/18 Potential to Achieve Goals: Fair    Frequency 7X/week   Barriers to discharge Inaccessible home environment;Decreased caregiver support Patient unable to enter home/will need 24/7 assistance. CIR best d/c recommendation at this time.     Co-evaluation               AM-PAC PT "6 Clicks" Mobility  Outcome Measure Help needed turning from your back to your side while in a flat bed without using bedrails?: A Little Help needed moving from lying on your back to sitting on the side of a flat bed without using bedrails?: A Lot Help needed moving to and from a bed to a chair (including a wheelchair)?: A Lot Help needed standing up from a chair using your arms (e.g., wheelchair or bedside chair)?: A Little Help needed to walk in hospital room?: A Lot Help needed climbing 3-5 steps with a railing? : Total 6 Click Score: 13    End of Session  Equipment Utilized During Treatment: Gait belt Activity Tolerance: Patient limited by fatigue;Treatment limited secondary to medical complications (VBTYOMA)([id number] with standing/dynamic stability ) Patient left: in bed;with bed alarm set;with call bell/phone within reach;with SCD's reapplied Nurse Communication: Mobility status;Other (comment)(HR elevation in standing) PT Visit Diagnosis: Unsteadiness on feet (R26.81);Other abnormalities of gait and mobility (R26.89);Repeated falls (R29.6);Muscle  weakness (generalized) (M62.81);History of falling (Z91.81);Ataxic gait (R26.0);Difficulty in walking, not elsewhere classified (R26.2);Other symptoms and signs involving the nervous system (R29.898)    Time: 9774-1423 PT Time Calculation (min) (ACUTE ONLY): 21 min   Charges:   PT Evaluation $PT Eval Low Complexity: 1 Low PT Treatments $Therapeutic Activity: 8-22 mins        Janna Arch, PT, DPT    Janna Arch 06/17/2018, 10:57 AM

## 2018-06-17 NOTE — H&P (Signed)
Yvette Riley is an 63 y.o. female.   Chief Complaint: Vomiting HPI: The patient with past medical history of lung cancer status post chemo and radiation, diabetes, hypertension and COPD presents to the emergency department with vomiting and blurred vision.  She also admits to feeling "dizzy".  The patient reports being unsteady on her feet and feeling as if she might fall to the left.  Her symptoms began approximately 2 hours prior to arrival in the emergency department.  CT of her head showed a subacute lesion in the right superior cerebellar artery territory.  The patient was stabilized emergency department staff called the hospitalist service for further evaluation.  Past Medical History:  Diagnosis Date  . Allergy   . Anemia   . Anxiety   . Arthritis   . Asthma   . Blood transfusion without reported diagnosis   . C. difficile diarrhea 04/07/2016  . CAP (community acquired pneumonia) 03/21/2015  . Combined forms of age-related cataract of both eyes 05/06/2016  . COPD (chronic obstructive pulmonary disease) ([place])   . CVA (cerebral vascular accident) ([place]) 06/17/2018  . Diabetes mellitus without complication ([place])   . Displacement of lumbar intervertebral disc without myelopathy   . Emphysema of lung ([place])   . GERD (gastroesophageal reflux disease)   . Glaucoma   . History of chicken pox   . Hypertension   . Pneumonia 03/29/2015  . Shortness of breath   . Small cell lung cancer [facility])     Past Surgical History:  Procedure Laterality Date  . CESAREAN SECTION    . TUBAL LIGATION      Family History  Problem Relation Age of Onset  . Heart failure Mother   . Heart disease Mother   . Stroke Mother   . Heart disease Brother   . COPD Brother   . Cancer Maternal Aunt        Breast Cancer   Social History:  reports that she quit smoking about 2 years ago. Her smoking use included cigarettes. She has a 11.25 pack-year smoking history. She has never used smokeless tobacco. She reports  that she does not drink alcohol or use drugs.  Allergies:  Allergies  Allergen Reactions  . Codeine Anaphylaxis  . Crestor [Rosuvastatin] Anaphylaxis  . Other Anaphylaxis  . Penicillins Anaphylaxis, Rash and Other (See Comments)    Reaction:  Tongue swelling  Has patient had a PCN reaction causing immediate rash, facial/tongue/throat swelling, SOB or lightheadedness with hypotension:  Yes   Has patient had a PCN reaction causing severe rash involving mucus membranes or skin necrosis: No Has patient had a PCN reaction that required hospitalization No Has patient had a PCN reaction occurring within the last 10 years: No If all of the above answers are "NO", then may proceed with Cephalosporin use.  . [place] Venom [Bee Venom] Anaphylaxis  . Gemfibrozil Rash, Nausea And Vomiting and Swelling  . Statins Nausea And Vomiting and Swelling  . Trazodone And Nefazodone Nausea And Vomiting  . Lipitor [Atorvastatin] Rash    Medications Prior to Admission  Medication Sig Dispense Refill  . albuterol (PROVENTIL) (2.5 MG/3ML) 0.083% nebulizer solution Take 3 mLs (2.5 mg total) by nebulization every 6 (six) hours as needed for wheezing or shortness of breath. 75 mL 3  . benzonatate (TESSALON PERLES) 100 MG capsule Take 1 capsule (100 mg total) by mouth 3 (three) times daily as needed for cough. 60 capsule 3  . clonazePAM (KLONOPIN) 0.5 MG tablet Take  1 tablet (0.5 mg total) by mouth 2 (two) times daily as needed. 60 tablet 2  . ezetimibe (ZETIA) 10 MG tablet Take 1 tablet (10 mg total) by mouth daily. 30 tablet 12  . gabapentin (NEURONTIN) 300 MG capsule Take 1 capsule (300 mg total) by mouth 5 (five) times daily. 150 capsule 3  . glucose blood test strip Contour test strip. Use to check blood sugar once a day for for type 2 diabetes (E11.9) 100 each 4  . Lancets MISC Use to check blood sugar once daily for type 2 diabetes E11.9 100 each 4  . meloxicam (MOBIC) 15 MG tablet TAKE 1 TABLET BY MOUTH  ONCE DAILY AS NEEDED FOR PAIN 90 tablet 3  . metFORMIN (GLUCOPHAGE XR) 500 MG 24 hr tablet Take 2 tablets (1,000 mg total) by mouth daily with breakfast. With meals 60 tablet 3  . montelukast (SINGULAIR) 10 MG tablet Take 1 tablet (10 mg total) by mouth at bedtime. 90 tablet 3  . NARCAN 4 MG/0.1ML LIQD nasal spray kit ADMINISTER A SINGLE SPRAY IN ONE NOSTRIL UPON SIGNS OF OPIOID OVERDOSE. CALL 911. REPEAT AFTER 3 MINUTES IF NO RESPONSE.  99  . Nutritional Supplements (GLUCERNA ADVANCE SHAKE) LIQD Drink 1-2 shakes per day (Patient not taking: Reported on 06/02/2018) 24 Bottle 12  . oxyCODONE-acetaminophen (PERCOCET) 10-325 MG tablet One tablet every 4-6 hours as needed. 180 tablet 0  . PROAIR HFA 108 (90 Base) MCG/ACT inhaler INHALE 2 PUFFS BY MOUTH EVERY 6 HOURS AS NEEDED FOR WHEEZING OR SHORTNESS OF BREATH  3  . ramelteon (ROZEREM) 8 MG tablet Take 1 tablet (8 mg total) by mouth at bedtime. 30 tablet 3  . umeclidinium-vilanterol (ANORO ELLIPTA) 62.5-25 MCG/INH AEPB Inhale 1 puff into the lungs daily. 30 each 12  . zaleplon (SONATA) 10 MG capsule TAKE 1 CAPSULE BY MOUTH IN THE EVENING. MAY TAKE SECOND CAPSULE AFTER 2 HOURS IF NEEDED 30 capsule 3    Results for orders placed or performed during the hospital encounter of 06/17/18 (from the past 48 hour(s))  Glucose, capillary     Status: Abnormal   Collection Time: 06/17/18 12:12 AM  Result Value Ref Range   Glucose-Capillary 157 (H) 70 - 99 mg/dL  Ethanol     Status: None   Collection Time: 06/17/18 12:19 AM  Result Value Ref Range   Alcohol, Ethyl (B) <10 <10 mg/dL    Comment: (NOTE) Lowest detectable limit for serum alcohol is 10 mg/dL. For medical purposes only. Performed at [facility], [address] [place], [place] [address]   CBC     Status: None   Collection Time: 06/17/18 12:19 AM  Result Value Ref Range   WBC 9.5 4.0 - 10.5 K/uL   RBC 4.29 3.87 - 5.11 MIL/uL   Hemoglobin 13.0 12.0 - 15.0 g/dL   HCT 40.4 36.0 -  46.0 %   MCV 94.2 80.0 - 100.0 fL   MCH 30.3 26.0 - 34.0 pg   MCHC 32.2 30.0 - 36.0 g/dL   RDW 14.3 11.5 - 15.5 %   Platelets 295 150 - 400 K/uL   nRBC 0.0 0.0 - 0.2 %    Comment: Performed at [facility], [address] [place], [place] [address]  Differential     Status: None   Collection Time: 06/17/18 12:19 AM  Result Value Ref Range   Neutrophils Relative % 76 %   Neutro Abs 7.2 1.7 - 7.7 K/uL   Lymphocytes Relative 14 %  Lymphs Abs 1.3 0.7 - 4.0 K/uL   Monocytes Relative 5 %   Monocytes Absolute 0.5 0.1 - 1.0 K/uL   Eosinophils Relative 5 %   Eosinophils Absolute 0.4 0.0 - 0.5 K/uL   Basophils Relative 0 %   Basophils Absolute 0.0 0.0 - 0.1 K/uL   Immature Granulocytes 0 %   Abs Immature Granulocytes 0.02 0.00 - 0.07 K/uL    Comment: Performed at [facility], [address] [place], [place] [address]  Comprehensive metabolic panel     Status: Abnormal   Collection Time: 06/17/18 12:19 AM  Result Value Ref Range   Sodium 139 135 - 145 mmol/L   Potassium 3.6 3.5 - 5.1 mmol/L   Chloride 107 98 - 111 mmol/L   CO2 19 (L) 22 - 32 mmol/L   Glucose, Bld 161 (H) 70 - 99 mg/dL   BUN 12 8 - 23 mg/dL   Creatinine, Ser 0.67 0.44 - 1.00 mg/dL   Calcium 9.2 8.9 - 10.3 mg/dL   Total Protein 7.8 6.5 - 8.1 g/dL   Albumin 4.3 3.5 - 5.0 g/dL   AST 21 15 - 41 U/L   ALT 15 0 - 44 U/L   Alkaline Phosphatase 77 38 - 126 U/L   Total Bilirubin 0.7 0.3 - 1.2 mg/dL   GFR calc non Af Amer >60 >60 mL/min   GFR calc Af Amer >60 >60 mL/min   Anion gap 13 5 - 15    Comment: Performed at [facility], [address], [place], [place] [address]  Troponin I - Once     Status: None   Collection Time: 06/17/18 12:19 AM  Result Value Ref Range   Troponin I <0.03 <0.03 ng/mL    Comment: Performed at [facility], [address], [place], [place] [address]  SARS Coronavirus 2 (CEPHEID - Performed in [place] hospital lab), Hosp Order     Status: None    Collection Time: 06/17/18 12:19 AM  Result Value Ref Range   SARS Coronavirus 2 NEGATIVE NEGATIVE    Comment: (NOTE) If result is NEGATIVE SARS-CoV-2 target nucleic acids are NOT DETECTED. The SARS-CoV-2 RNA is generally detectable in upper and lower  respiratory specimens during the acute phase of infection. The lowest  concentration of SARS-CoV-2 viral copies this assay can detect is 250  copies / mL. A negative result does not preclude SARS-CoV-2 infection  and should not be used as the sole basis for treatment or other  patient management decisions.  A negative result may occur with  improper specimen collection / handling, submission of specimen other  than nasopharyngeal swab, presence of viral mutation(s) within the  areas targeted by this assay, and inadequate number of viral copies  (<250 copies / mL). A negative result must be combined with clinical  observations, patient history, and epidemiological information. If result is POSITIVE SARS-CoV-2 target nucleic acids are DETECTED. The SARS-CoV-2 RNA is generally detectable in upper and lower  respiratory specimens dur ing the acute phase of infection.  Positive  results are indicative of active infection with SARS-CoV-2.  Clinical  correlation with patient history and other diagnostic information is  necessary to determine patient infection status.  Positive results do  not rule out bacterial infection or co-infection with other viruses. If result is PRESUMPTIVE POSTIVE SARS-CoV-2 nucleic acids MAY BE PRESENT.   A presumptive positive result was obtained on the submitted specimen  and confirmed on repeat testing.  While 2019 novel  coronavirus  (SARS-CoV-2) nucleic acids may be present in the submitted sample  additional confirmatory testing may be necessary for epidemiological  and / or clinical management purposes  to differentiate between  SARS-CoV-2 and other Sarbecovirus currently known to infect humans.  If clinically  indicated additional testing with an alternate test  methodology [phone]) is advised. The SARS-CoV-2 RNA is generally  detectable in upper and lower respiratory sp ecimens during the acute  phase of infection. The expected result is Negative. Fact Sheet for Patients:  StrictlyIdeas.no Fact Sheet for Healthcare Providers: BankingDealers.co.za This test is not yet approved or cleared by the [place] FDA and has been authorized for detection and/or diagnosis of SARS-CoV-2 by FDA under an Emergency Use Authorization (EUA).  This EUA will remain in effect (meaning this test can be used) for the duration of the COVID-19 declaration under Section 564(b)(1) of the Act, 21 U.S.C. section 360bbb-3(b)(1), unless the authorization is terminated or revoked sooner. Performed at [facility], [address] [place], [place] [address]   Protime-INR     Status: None   Collection Time: 06/17/18  2:19 AM  Result Value Ref Range   Prothrombin Time 13.3 11.4 - 15.2 seconds   INR 1.0 0.8 - 1.2    Comment: (NOTE) INR goal varies based on device and disease states. Performed at [facility], [address] [place], [place] [address]   APTT     Status: Abnormal   Collection Time: 06/17/18  2:19 AM  Result Value Ref Range   aPTT <24 (L) 24 - 36 seconds    Comment: Performed at [facility], [address], [place], [place] [address]   Dg Chest 1 View  Result Date: 06/17/2018 CLINICAL DATA:  63 y/o F; weakness. History of smoking, COPD, and lung cancer. EXAM: CHEST  1 VIEW COMPARISON:  03/05/2018 CT chest. FINDINGS: Stable cardiac silhouette given projection and technique. Prominent pericardial fat pads. Right hilar and upper lobe consolidation is stable compatible with radiation fibrosis. There are increased bronchitic changes in the left mid and lower lung zones. No pleural effusion or pneumothorax. No acute osseous  abnormality is evident. IMPRESSION: Increased bronchitic changes in the left mid and lower lung zones. 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 Electronically Signed   By: Kristine Garbe M.D.   On: 06/17/2018 01:40   Ct Head Code Stroke Wo Contrast  Addendum Date: 06/17/2018   ADDENDUM REPORT: 06/17/2018 00:31 ADDENDUM: Study discussed by telephone with Dr. Luvenia Starch SUNG on 06/17/2018 at 0028 hours. Electronically Signed   By: Genevie Ann M.D.   On: 06/17/2018 00:31   Result Date: 06/17/2018 CLINICAL DATA:  Code stroke. 63 year old female with slurred speech and eye pain onset 2 hours ago. History of small cell lung cancer. EXAM: CT HEAD WITHOUT CONTRAST TECHNIQUE: Contiguous axial images were obtained from the base of the skull through the vertex without intravenous contrast. COMPARISON:  Brain MRI 12/12/2016. Head CT 12/28/2015. FINDINGS: Brain: New wedge-shaped hypodensity in the right superior cerebellar artery territory best seen on coronal image 43. No associated hemorrhage. No posterior fossa mass effect. Other posterior fossa gray-white matter differentiation is within normal limits. Mild cerebral volume loss since 2017. No midline shift, or evidence of intracranial mass lesion. No ventriculomegaly. No acute intracranial hemorrhage identified. No cerebral cortically based infarct identified. No cortical encephalomalacia identified. Vascular: Calcified atherosclerosis at the skull base. No suspicious intracranial vascular hyperdensity. Skull: No acute osseous abnormality identified. Sinuses/Orbits: Improved sphenoid sinus  aeration since 2017. Other Visualized paranasal sinuses and mastoids are stable and well pneumatized. Other: Mildly Disconjugate gaze. Negative visible scalp soft tissues. ASPECTS [facility] Stroke Program Early CT Score) - Ganglionic level infarction (caudate, lentiform nuclei, internal capsule, insula, M1-M3 cortex): 7 - Supraganglionic infarction (M4-M6 cortex): 3 Total  score (0-10 with 10 being normal): 10 IMPRESSION: 1. Hypodensity in the right superior cerebellum most resembles an acute to subacute Right SCA territory infarct. No associated hemorrhage or mass effect. Given history of small cell lung cancer recommend follow-up Brain MRI to confirm. 2. No supratentorial cortically based infarct identified. ASPECTS 10. Electronically Signed: By: Genevie Ann M.D. On: 06/17/2018 00:17    Review of Systems  Constitutional: Negative for chills and fever.  HENT: Negative for sore throat and tinnitus.   Eyes: Negative for blurred vision and redness.  Respiratory: Negative for cough and shortness of breath.   Cardiovascular: Negative for chest pain, palpitations, orthopnea and PND.  Gastrointestinal: Negative for abdominal pain, diarrhea, nausea and vomiting.  Genitourinary: Negative for dysuria, frequency and urgency.  Musculoskeletal: Negative for joint pain and myalgias.  Skin: Negative for rash.       No lesions  Neurological: Positive for speech change. Negative for focal weakness and weakness.  Endo/Heme/Allergies: Does not bruise/bleed easily.       No temperature intolerance  Psychiatric/Behavioral: Negative for depression and suicidal ideas.    Blood pressure 136/86, pulse 99, temperature 97.7 F (36.5 C), temperature source Oral, resp. rate 19, height '4\' 11"'$  (1.499 m), weight 81.6 kg, SpO2 99 %. Physical Exam  Vitals reviewed. Constitutional: She is oriented to person, place, and time. She appears well-developed and well-nourished. No distress.  HENT:  Head: Normocephalic and atraumatic.  Mouth/Throat: Oropharynx is clear and moist.  Eyes: Pupils are equal, round, and reactive to light. Conjunctivae and EOM are normal. No scleral icterus.  Neck: Normal range of motion. Neck supple. No JVD present. No tracheal deviation present. No thyromegaly present.  Cardiovascular: Normal rate, regular rhythm and normal heart sounds. Exam reveals no gallop and no  friction rub.  No murmur heard. Respiratory: Effort normal and breath sounds normal.  GI: Soft. Bowel sounds are normal. She exhibits no distension. There is no abdominal tenderness.  Genitourinary:    Genitourinary Comments: Deferred   Musculoskeletal: Normal range of motion.        General: No edema.  Lymphadenopathy:    She has no cervical adenopathy.  Neurological: She is alert and oriented to person, place, and time. No cranial nerve deficit. She exhibits normal muscle tone.  Heel to she with left leg mildly impaired  Skin: Skin is warm and dry. No rash noted. No erythema.  Psychiatric: She has a normal mood and affect. Her behavior is normal. Judgment and thought content normal.     Assessment/Plan This is a 63 year old female admitted for stroke. 1.  CVA: Right superior cerebellum with associated ataxia.  Obtain MRI/MRA of the brain.  (MRI with contrast to rule out mets given history of lung cancer).  Bilateral carotid ultrasounds, echocardiogram as well as neurology consult ordered.  (Dysphagia at baseline secondary to radiation treatment). 2.  COPD: Continue inhaled corticosteroid as well as long-acting bronchial agonist with anticholinergic.  Butyryl as needed. 3.  Diabetes mellitus type 2: Sliding scale insulin while hospitalized. 4.  History of lung cancer: Status post chemotherapy and radiation to upper chest.  The patient reports radiation damage to her throat as well as chronic radiation pneumonitis exacerbates her  COPD. 5.  DVT prophylaxis: Lovenox 6.  GI prophylaxis: None The patient is a full code.  Time spent on admission orders and patient care approximately 45 minutes  Harrie Foreman, MD 06/17/2018, 3:34 AM

## 2018-06-17 NOTE — ED Notes (Signed)
Attempted to get patient to restroom, patient stood at bedside, very unstable. Patient used bedpan without difficulty

## 2018-06-17 NOTE — Progress Notes (Signed)
Per Dr. Tressia Miners, vitals and neuro checks to be completed every 4 hours. Madlyn Frankel, RN

## 2018-06-17 NOTE — ED Notes (Signed)
Patient expresses difficulty swallowing, says she "can't get her mouth to work right". MD notified, patient failed swallow screen

## 2018-06-17 NOTE — ED Notes (Signed)
Patient back from CT, tele neurologist completed assessment. A&O x4, patient vitals stable, will do swallow screen

## 2018-06-17 NOTE — ED Triage Notes (Signed)
Tele neuro provider at bedside

## 2018-06-17 NOTE — Progress Notes (Signed)
Pt. is currently off the unit to radiology.
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
## 2018-06-17 NOTE — Evaluation (Signed)
Occupational Therapy Evaluation Patient Details Name: Yvette Riley MRN: [id number] DOB: 1955/11/10 Today's Date: 06/17/2018    History of Present Illness Patient is a pleasant 63 year old female who presented to Ed for sudden vomiting and blurred vision with noticeable change in balance, imaging revelaed a subacute lesion in R superior cerebellar artery. Patient reports she has been unsteady for a few days with last "normal day" being 5/16 however has been unsteady with her balance for the past 3 years. Reports 5-6 falls in the past two months. PMh includes lung cancer (chemo and radiation), DM, HTN, COPD, anemia, anxiety, CVA, and glaucoma.   Clinical Impression   Pt seen for OT evaluation this date. Prior to hospital admission, pt was independent with ADL tasks, using a RW or SPC for all mobility, and only needed assist from son for groceries. Pt lives with her son in a one-level home with two steps to enter. Currently pt demonstrates impairments in RUE/LLE strength, coordination, sensation, motor planning, and balance, requiring increased assist for ADL and mobility. Pt also reports 8/10 headache, mild dizziness, and lightheadedness at rest that does not change with positioning. Pt is R hand dominant and requires mod assist for LB ADL and CGA assist for transfers. Pt is eager to return to his PLOF with increased independence and functional use of her RUE and RLE. Pt educated in [facility] ex for her RUE in order to promote functional return with pt able to return demo. Pt was motivated t/o OT session. Pt demonstrates awareness of deficits and no cognitive deficits noted with assessment on this date. Pt would benefit from high intensity, acute skilled OT to address noted impairments and functional limitations (see below for any additional details) in order to maximize safety and independence while minimizing falls risk and caregiver burden.  Upon hospital discharge, recommend pt discharge to CIR to maximize  safety and return to PLOF.    Follow Up Recommendations  CIR    Equipment Recommendations  3 in 1 bedside commode    Recommendations for Other Services       Precautions / Restrictions Precautions Precautions: Fall Restrictions Weight Bearing Restrictions: No      Mobility Bed Mobility Overal bed mobility: Needs Assistance Bed Mobility: Supine to Sit;Sit to Supine     Supine to sit: Supervision;HOB elevated Sit to supine: Min assist;HOB elevated   General bed mobility comments: Patient requires extra time and slow scooting of R foot towards edge of bed,unable to lift and bring to side of bed. Requires min A to return R foot to bed.   Transfers Overall transfer level: Needs assistance   Transfers: Sit to/from Stand Sit to Stand: Min guard;Min assist         General transfer comment: patient very unsteady requiring cueing for hand placement and assistance to maintain standing due to drift. Unable to maintain WB on RLE. HR elevating.     Balance Overall balance assessment: Needs assistance;History of Falls Sitting-balance support: Bilateral upper extremity supported;Feet supported Sitting balance-Leahy Scale: Fair Sitting balance - Comments: Able to maintain with SBA with BUE support however requires occasional Min A when reaching with one UE Postural control: Left lateral lean;Posterior lean Standing balance support: Bilateral upper extremity supported Standing balance-Leahy Scale: Poor Standing balance comment: Patient is very unsteady in standing with RW with poor weight shift, requires BUE support  ADL either performed or assessed with clinical judgement   ADL Overall ADL's : Needs assistance/impaired Eating/Feeding: Minimal assistance;Sitting   Grooming: Sitting;Set up   Upper Body Bathing: Sitting;Minimal assistance   Lower Body Bathing: Sit to/from stand;Moderate assistance   Upper Body Dressing : Sitting;Minimal  assistance   Lower Body Dressing: Sit to/from stand;Moderate assistance   Toilet Transfer: Ambulation;Regular Toilet;Min guard;RW                   Vision Baseline Vision/History: Wears glasses Wears Glasses: At all times Patient Visual Report: Blurring of vision(intermittent blurry vision in both eyes) Vision Assessment?: Vision impaired- to be further tested in functional context Additional Comments: pt denies blurriness during evaluation but states that it comes and goes, difficulty with accommodation ("takes time to adjust")     Perception     Praxis      Pertinent Vitals/Pain Pain Assessment: 0-10 Pain Score: 8  Pain Location: behind R eye and headache (R posterior) Pain Descriptors / Indicators: Aching;Discomfort Pain Intervention(s): Limited activity within patient's tolerance;Monitored during session     Hand Dominance Right   Extremity/Trunk Assessment Upper Extremity Assessment Upper Extremity Assessment: RUE deficits/detail;LUE deficits/detail RUE Deficits / Details: grossly 3+/5, impaired coordination, decreased sensation, + drift, pt endorses that RUE "moves when I don't ask it to" at times RUE Sensation: decreased light touch RUE Coordination: decreased fine motor;decreased gross motor LUE Deficits / Details: grossly 4/5   Lower Extremity Assessment Lower Extremity Assessment: Defer to PT evaluation;RLE deficits/detail;LLE deficits/detail RLE Deficits / Details: limited due to drifting of R foot/LE, grossly 3-/5, decreased sensation RLE Sensation: decreased light touch RLE Coordination: decreased gross motor LLE Deficits / Details: grossly 3+/5 LLE Sensation: WNL LLE Coordination: WNL   Cervical / Trunk Assessment Cervical / Trunk Assessment: Normal   Communication Communication Communication: No difficulties   Cognition Arousal/Alertness: Awake/alert Behavior During Therapy: WFL for tasks assessed/performed Overall Cognitive Status: Within  Functional Limits for tasks assessed                                 General Comments: alert and oriented. Understands deficits and eager to participate in rehab/CIR to return to PLOF.    General Comments  slight edema in BLE     Exercises Other Exercises Other Exercises: education on safe bed mobility and transfers for fall reduction Other Exercises: EOB stability and LE movement Other Exercises: utilizing both hands, pt manages to manipulate items in her purse to find her eye glasses, using her R hand to pull items out, decr coordination noted but able to do without assist Other Exercises: pt instructed in [facility] ex for RUE   Shoulder Instructions      Home Living Family/patient expects to be discharged to:: Inpatient rehab Living Arrangements: Children                               Additional Comments: Per patient she lives a single level home with 2 steps to enter, 0 railing, has a tub/shower with no grab bars. Patient eager to participate in CIR to return to PLOF.       Prior Functioning/Environment Level of Independence: Needs assistance  Gait / Transfers Assistance Needed: uses a walker/cane at baseline for ambulation inside and outside home ADL's / Homemaking Assistance Needed: son and daughter in law help with grocery shopping. Son can provide transportation  when needed   Comments: 5-6 falls        OT Problem List: Decreased strength;Impaired vision/perception;Decreased knowledge of use of DME or AE;Decreased coordination;Impaired UE functional use;Pain;Impaired sensation;Impaired balance (sitting and/or standing)      OT Treatment/Interventions: Self-care/ADL training;Balance training;Therapeutic exercise;Neuromuscular education;Therapeutic activities;DME and/or AE instruction;Patient/family education;Visual/perceptual remediation/compensation    OT Goals(Current goals can be found in the care plan section) Acute Rehab OT Goals Patient Stated  Goal: return to PLOF OT Goal Formulation: With patient Time For Goal Achievement: 07/01/18 Potential to Achieve Goals: Good ADL Goals Pt Will Perform Eating: with modified independence;sitting(using R dom UE and assisting with LUE as needed) Pt Will Perform Upper Body Dressing: with supervision;sitting Pt Will Perform Lower Body Dressing: with min assist;sit to/from stand Pt Will Transfer to Toilet: with supervision;ambulating;regular height toilet(LRAD for amb) Pt/caregiver will Perform Home Exercise Program: Right Upper extremity;With written HEP provided;Independently([facility])  OT Frequency: Min 3X/week   Barriers to D/C:            Co-evaluation              AM-PAC OT "6 Clicks" Daily Activity     Outcome Measure Help from another person eating meals?: A Little Help from another person taking care of personal grooming?: A Little Help from another person toileting, which includes using toliet, bedpan, or urinal?: A Little Help from another person bathing (including washing, rinsing, drying)?: A Lot Help from another person to put on and taking off regular upper body clothing?: A Little Help from another person to put on and taking off regular lower body clothing?: A Lot 6 Click Score: 16   End of Session    Activity Tolerance: Patient tolerated treatment well Patient left: in bed;with call bell/phone within reach;with bed alarm set;with SCD's reapplied;with nursing/sitter in room(nursing in room for blood surgar check)  OT Visit Diagnosis: Other abnormalities of gait and mobility (R26.89);Repeated falls (R29.6);Dizziness and giddiness (R42);Hemiplegia and hemiparesis;Low vision, both eyes (H54.2) Hemiplegia - Right/Left: Right Hemiplegia - dominant/non-dominant: Dominant Hemiplegia - caused by: Cerebral infarction                Time: 3734-2876 OT Time Calculation (min): 22 min Charges:  OT General Charges $OT Visit: 1 Visit OT Evaluation $OT Eval Moderate Complexity:  1 Mod OT Treatments $Neuromuscular Re-education: 8-22 mins  Jeni Salles, MPH, MS, OTR/L ascom [phone] 06/17/18, 12:14 PM

## 2018-06-17 NOTE — Progress Notes (Signed)
[facility] [place] NAME: Yvette Riley    MR#:  [id number]  DATE OF BIRTH:  06-27-1955  SUBJECTIVE:  CHIEF COMPLAINT:   Chief Complaint  Patient presents with   Headache   Dizziness   -Patient on chronic pain medication at home.  Several falls lately in the last couple of months.  Unsteady gait, sudden onset of blurred vision and dizziness yesterday. -MRI positive for acute right cerebellar infarct.  REVIEW OF SYSTEMS:  Review of Systems  Constitutional: Negative for chills, fever and malaise/fatigue.  HENT: Negative for congestion, ear discharge, hearing loss and nosebleeds.   Eyes: Negative for blurred vision and double vision.  Respiratory: Negative for cough, shortness of breath and wheezing.   Cardiovascular: Negative for chest pain and palpitations.  Gastrointestinal: Positive for nausea and vomiting. Negative for abdominal pain, constipation and diarrhea.  Genitourinary: Negative for dysuria.  Musculoskeletal: Negative for myalgias.  Neurological: Positive for dizziness and focal weakness. Negative for tingling, seizures and headaches.  Psychiatric/Behavioral: Negative for depression.    DRUG ALLERGIES:   Allergies  Allergen Reactions   Codeine Anaphylaxis   Crestor [Rosuvastatin] Anaphylaxis   Other Anaphylaxis   Penicillins Anaphylaxis, Rash and Other (See Comments)    Reaction:  Tongue swelling  Has patient had a PCN reaction causing immediate rash, facial/tongue/throat swelling, SOB or lightheadedness with hypotension:  Yes   Has patient had a PCN reaction causing severe rash involving mucus membranes or skin necrosis: No Has patient had a PCN reaction that required hospitalization No Has patient had a PCN reaction occurring within the last 10 years: No If all of the above answers are "NO", then may proceed with Cephalosporin use.   Yellow Jacket Venom [Bee Venom] Anaphylaxis   Gemfibrozil Rash, Nausea  And Vomiting and Swelling   Statins Nausea And Vomiting and Swelling   Trazodone And Nefazodone Nausea And Vomiting   Lipitor [Atorvastatin] Rash    VITALS:  Blood pressure (!) 121/96, pulse 98, temperature 97.8 F (36.6 C), temperature source Oral, resp. rate 18, height 4\' 11"  (1.499 m), weight 81.6 kg, SpO2 98 %.  PHYSICAL EXAMINATION:  Physical Exam   GENERAL:  63 y.o.-year-old patient lying in the bed with no acute distress.  EYES: Pupils equal, round, reactive to light and accommodation. No scleral icterus. Extraocular muscles intact.  HEENT: Head atraumatic, normocephalic. Oropharynx and nasopharynx clear.  NECK:  Supple, no jugular venous distention. No thyroid enlargement, no tenderness.  LUNGS: Normal breath sounds bilaterally, no wheezing, rales,rhonchi or crepitation. No use of accessory muscles of respiration.  Decreased bibasilar breath sounds CARDIOVASCULAR: S1, S2 normal. No  rubs, or gallops.  2/6 systolic murmur is present ABDOMEN: Soft, nontender, nondistended. Bowel sounds present. No organomegaly or mass.  EXTREMITIES: No pedal edema, cyanosis, or clubbing.  NEUROLOGIC: Cranial nerves II through XII are intact.  Speech is clear, does not have her dentures now.  Muscle strength is 5/5 in left upper and lower extremities.  Right lower extremity is 4/5 and right upper extremity is 5/5 with significant drift on extension.. Sensation intact. Gait not checked.  PSYCHIATRIC: The patient is alert and oriented x 3.  SKIN: No obvious rash, lesion, or ulcer.    LABORATORY PANEL:   CBC Recent Labs  Lab 06/17/18 0019  WBC 9.5  HGB 13.0  HCT 40.4  PLT 295   ------------------------------------------------------------------------------------------------------------------  Chemistries  Recent Labs  Lab 06/17/18 0019  NA 139  K 3.6  CL  107  CO2 19*  GLUCOSE 161*  BUN 12  CREATININE 0.67  CALCIUM 9.2  AST 21  ALT 15  ALKPHOS 77  BILITOT 0.7    ------------------------------------------------------------------------------------------------------------------  Cardiac Enzymes Recent Labs  Lab 06/17/18 0019  TROPONINI <0.03   ------------------------------------------------------------------------------------------------------------------  RADIOLOGY:  Dg Chest 1 View  Result Date: 06/17/2018 CLINICAL DATA:  63 y/o F; weakness. History of smoking, COPD, and lung cancer. EXAM: CHEST  1 VIEW COMPARISON:  03/05/2018 CT chest. FINDINGS: Stable cardiac silhouette given projection and technique. Prominent pericardial fat pads. Right hilar and upper lobe consolidation is stable compatible with radiation fibrosis. There are increased bronchitic changes in the left mid and lower lung zones. No pleural effusion or pneumothorax. No acute osseous abnormality is evident. IMPRESSION: Increased bronchitic changes in the left mid and lower lung zones. 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 Electronically Signed   By: Kristine Garbe M.D.   On: 06/17/2018 01:40   Mr Brain Wo Contrast  Result Date: 06/17/2018 CLINICAL DATA:  Vomiting and blurred vision EXAM: MRI HEAD WITHOUT CONTRAST TECHNIQUE: Multiplanar, multiecho pulse sequences of the brain and surrounding structures were obtained without intravenous contrast. COMPARISON:  Head CT from earlier today FINDINGS: Brain: Sizable acute infarct in the upper right cerebellum, superior cerebellar territory. No hemorrhagic conversion. No infarct in the separate distribution. There is history of lung cancer. No masslike findings. Mild chronic small vessel ischemic change in the periventricular white matter. No hydrocephalus, collection, or age advanced volume loss. Coronal T2 was not acquired/not available. Vascular: Major flow voids are preserved, including in the basilar. Skull and upper cervical spine: Negative for marrow lesion Sinuses/Orbits: Negative IMPRESSION: 1. Acute right  superior cerebellar infarct. The fourth ventricle is widely patent. 2. Background of mild chronic small vessel ischemia. Electronically Signed   By: Monte Fantasia M.D.   On: 06/17/2018 10:34   Ct Head Code Stroke Wo Contrast  Addendum Date: 06/17/2018   ADDENDUM REPORT: 06/17/2018 00:31 ADDENDUM: Study discussed by telephone with Dr. Luvenia Starch SUNG on 06/17/2018 at 0028 hours. Electronically Signed   By: Genevie Ann M.D.   On: 06/17/2018 00:31   Result Date: 06/17/2018 CLINICAL DATA:  Code stroke. 63 year old female with slurred speech and eye pain onset 2 hours ago. History of small cell lung cancer. EXAM: CT HEAD WITHOUT CONTRAST TECHNIQUE: Contiguous axial images were obtained from the base of the skull through the vertex without intravenous contrast. COMPARISON:  Brain MRI 12/12/2016. Head CT 12/28/2015. FINDINGS: Brain: New wedge-shaped hypodensity in the right superior cerebellar artery territory best seen on coronal image 43. No associated hemorrhage. No posterior fossa mass effect. Other posterior fossa gray-white matter differentiation is within normal limits. Mild cerebral volume loss since 2017. No midline shift, or evidence of intracranial mass lesion. No ventriculomegaly. No acute intracranial hemorrhage identified. No cerebral cortically based infarct identified. No cortical encephalomalacia identified. Vascular: Calcified atherosclerosis at the skull base. No suspicious intracranial vascular hyperdensity. Skull: No acute osseous abnormality identified. Sinuses/Orbits: Improved sphenoid sinus aeration since 2017. Other Visualized paranasal sinuses and mastoids are stable and well pneumatized. Other: Mildly Disconjugate gaze. Negative visible scalp soft tissues. ASPECTS [facility] Stroke Program Early CT Score) - Ganglionic level infarction (caudate, lentiform nuclei, internal capsule, insula, M1-M3 cortex): 7 - Supraganglionic infarction (M4-M6 cortex): 3 Total score (0-10 with 10 being normal): 10  IMPRESSION: 1. Hypodensity in the right superior cerebellum most resembles an acute to subacute Right SCA territory infarct. No associated hemorrhage or mass effect. Given  history of small cell lung cancer recommend follow-up Brain MRI to confirm. 2. No supratentorial cortically based infarct identified. ASPECTS 10. Electronically Signed: By: Genevie Ann M.D. On: 06/17/2018 00:17    EKG:   Orders placed or performed during the hospital encounter of 06/17/18   ED EKG   ED EKG   EKG 12-Lead   EKG 12-Lead    ASSESSMENT AND PLAN:   63 year old female with past medical history significant for severe arthritis, anemia, history of small cell lung cancer in remission, COPD and emphysema and diabetes presents from home secondary to worsening gait and blurred vision.  1.  Acute CVA-significant right-sided weakness with right arm drift noted on exam.  MRI of the brain showing right cerebellar infarct. -Appreciate telemetry neuro input.  Patient has MRA, carotid Dopplers and echocardiogram pending -Formal neurology consult pending -Continue aspirin, patient allergic to statins.  LDL of 75 -Speech, PT and OT consults pending  2.  Diabetes mellitus-A1c.  Metformin and sliding scale insulin for now  3.  Neuropathy-continue gabapentin chronic pain medications  4.  Anxiety-on Klonopin  5.  DVT prophylaxis-Lovenox  Might need inpatient rehab at discharge   All the records are reviewed and case discussed with Care Management/Social Workerr. Management plans discussed with the patient, family and they are in agreement.  CODE STATUS: Full code  TOTAL TIME TAKING CARE OF THIS PATIENT: 39 minutes.   POSSIBLE D/C IN 2 DAYS, DEPENDING ON CLINICAL CONDITION.   Gladstone Lighter M.D on 06/17/2018 at 12:40 PM  Between 7am to 6pm - Pager - [phone]  After 6pm go to [email or web address] - password [email or web address] [facility]  Office  [phone]  CC: Primary care physician; Birdie Sons, MD

## 2018-06-17 NOTE — ED Triage Notes (Addendum)
Sudden onset rt sided HA, pain behind rt eye, dizziness, slurred speech, vomiting--see ED narrator; hx lung CA

## 2018-06-17 NOTE — Progress Notes (Signed)
PT Cancellation Note  Patient Details Name: Yvette Riley MRN: [id number] DOB: July 24, 1955   Cancelled Treatment:    Reason Eval/Treat Not Completed: Patient at procedure or test/unavailable(Patient with speech therapy. Will attempt again at later time/date. )  Janna Arch, PT, DPT   06/17/2018, 9:17 AM

## 2018-06-17 NOTE — ED Notes (Signed)
ED TO INPATIENT HANDOFF REPORT  ED Nurse Name and Phone #: [place] Levels Name/Age/Gender Yvette Riley 63 y.o. female Room/Bed: ED15A/ED15A  Code Status   Code Status: Prior  Home/SNF/Other Home Patient oriented to: self, place, time and situation Is this baseline? Yes   Triage Complete: Triage complete  Chief Complaint weakness  Triage Note Sudden onset rt sided HA, pain behind rt eye, dizziness, slurred speech, vomiting--see ED narrator; hx lung CA  Patient arrives via POV with R headache, R eye blurred vision, slurred speech, slight facial droop. Code stroke activated  Tele neuro provider at bedside   Allergies Allergies  Allergen Reactions  . Codeine Anaphylaxis  . Crestor [Rosuvastatin] Anaphylaxis  . Other Anaphylaxis  . Penicillins Anaphylaxis, Rash and Other (See Comments)    Reaction:  Tongue swelling  Has patient had a PCN reaction causing immediate rash, facial/tongue/throat swelling, SOB or lightheadedness with hypotension:  Yes   Has patient had a PCN reaction causing severe rash involving mucus membranes or skin necrosis: No Has patient had a PCN reaction that required hospitalization No Has patient had a PCN reaction occurring within the last 10 years: No If all of the above answers are "NO", then may proceed with Cephalosporin use.  . [place] Venom [Bee Venom] Anaphylaxis  . Gemfibrozil Rash, Nausea And Vomiting and Swelling  . Statins Nausea And Vomiting and Swelling  . Trazodone And Nefazodone Nausea And Vomiting  . Lipitor [Atorvastatin] Rash    Level of Care/Admitting Diagnosis ED Disposition    ED Disposition Condition [facility] Area: [place] [[id number]]  Level of Care: Med-Surg [16]  Covid Evaluation: Screening Protocol (No Symptoms)  Diagnosis: CVA (cerebral vascular accident) [facility]) [[id number]]  Admitting Physician: Harrie Foreman [[id number]]  Attending Physician: Harrie Foreman [[id number]]   Estimated length of stay: past midnight tomorrow  Certification:: I certify this patient will need inpatient services for at least 2 midnights  PT Class (Do Not Modify): Inpatient [101]  PT Acc Code (Do Not Modify): Private [1]       B Medical/Surgery History Past Medical History:  Diagnosis Date  . Allergy   . Anemia   . Anxiety   . Arthritis   . Asthma   . Blood transfusion without reported diagnosis   . C. difficile diarrhea 04/07/2016  . CAP (community acquired pneumonia) 03/21/2015  . Combined forms of age-related cataract of both eyes 05/06/2016  . COPD (chronic obstructive pulmonary disease) ([place])   . CVA (cerebral vascular accident) ([place]) 06/17/2018  . Diabetes mellitus without complication ([place])   . Displacement of lumbar intervertebral disc without myelopathy   . Emphysema of lung ([place])   . GERD (gastroesophageal reflux disease)   . Glaucoma   . History of chicken pox   . Hypertension   . Pneumonia 03/29/2015  . Shortness of breath   . Small cell lung cancer [facility])    Past Surgical History:  Procedure Laterality Date  . CESAREAN SECTION    . TUBAL LIGATION       A IV Location/Drains/Wounds Patient Lines/Drains/Airways Status   Active Line/Drains/Airways    Name:   Placement date:   Placement time:   Site:   Days:   Peripheral IV 06/17/18 Left Hand   06/17/18    0010    Hand   less than 1          Intake/Output Last 24 hours No intake or output data in  the 24 hours ending 06/17/18 0409  Labs/Imaging Results for orders placed or performed during the hospital encounter of 06/17/18 (from the past 48 hour(s))  Glucose, capillary     Status: Abnormal   Collection Time: 06/17/18 12:12 AM  Result Value Ref Range   Glucose-Capillary 157 (H) 70 - 99 mg/dL  Ethanol     Status: None   Collection Time: 06/17/18 12:19 AM  Result Value Ref Range   Alcohol, Ethyl (B) <10 <10 mg/dL    Comment: (NOTE) Lowest detectable limit for serum alcohol is 10 mg/dL. For medical  purposes only. Performed at [facility], [address] [place], [place] [address]   CBC     Status: None   Collection Time: 06/17/18 12:19 AM  Result Value Ref Range   WBC 9.5 4.0 - 10.5 K/uL   RBC 4.29 3.87 - 5.11 MIL/uL   Hemoglobin 13.0 12.0 - 15.0 g/dL   HCT 40.4 36.0 - 46.0 %   MCV 94.2 80.0 - 100.0 fL   MCH 30.3 26.0 - 34.0 pg   MCHC 32.2 30.0 - 36.0 g/dL   RDW 14.3 11.5 - 15.5 %   Platelets 295 150 - 400 K/uL   nRBC 0.0 0.0 - 0.2 %    Comment: Performed at [facility], [address] [place], [place] [address]  Differential     Status: None   Collection Time: 06/17/18 12:19 AM  Result Value Ref Range   Neutrophils Relative % 76 %   Neutro Abs 7.2 1.7 - 7.7 K/uL   Lymphocytes Relative 14 %   Lymphs Abs 1.3 0.7 - 4.0 K/uL   Monocytes Relative 5 %   Monocytes Absolute 0.5 0.1 - 1.0 K/uL   Eosinophils Relative 5 %   Eosinophils Absolute 0.4 0.0 - 0.5 K/uL   Basophils Relative 0 %   Basophils Absolute 0.0 0.0 - 0.1 K/uL   Immature Granulocytes 0 %   Abs Immature Granulocytes 0.02 0.00 - 0.07 K/uL    Comment: Performed at [facility], [address] [place], [place] [address]  Comprehensive metabolic panel     Status: Abnormal   Collection Time: 06/17/18 12:19 AM  Result Value Ref Range   Sodium 139 135 - 145 mmol/L   Potassium 3.6 3.5 - 5.1 mmol/L   Chloride 107 98 - 111 mmol/L   CO2 19 (L) 22 - 32 mmol/L   Glucose, Bld 161 (H) 70 - 99 mg/dL   BUN 12 8 - 23 mg/dL   Creatinine, Ser 0.67 0.44 - 1.00 mg/dL   Calcium 9.2 8.9 - 10.3 mg/dL   Total Protein 7.8 6.5 - 8.1 g/dL   Albumin 4.3 3.5 - 5.0 g/dL   AST 21 15 - 41 U/L   ALT 15 0 - 44 U/L   Alkaline Phosphatase 77 38 - 126 U/L   Total Bilirubin 0.7 0.3 - 1.2 mg/dL   GFR calc non Af Amer >60 >60 mL/min   GFR calc Af Amer >60 >60 mL/min   Anion gap 13 5 - 15    Comment: Performed at [facility], [address] [place], [place] [address]  Troponin I - Once      Status: None   Collection Time: 06/17/18 12:19 AM  Result Value Ref Range   Troponin I <0.03 <0.03 ng/mL    Comment: Performed at [facility], [address] [place], [place] [address]  SARS Coronavirus 2 (CEPHEID - Performed in [place]  hospital lab), Hosp Order     Status: None   Collection Time: 06/17/18 12:19 AM  Result Value Ref Range   SARS Coronavirus 2 NEGATIVE NEGATIVE    Comment: (NOTE) If result is NEGATIVE SARS-CoV-2 target nucleic acids are NOT DETECTED. The SARS-CoV-2 RNA is generally detectable in upper and lower  respiratory specimens during the acute phase of infection. The lowest  concentration of SARS-CoV-2 viral copies this assay can detect is 250  copies / mL. A negative result does not preclude SARS-CoV-2 infection  and should not be used as the sole basis for treatment or other  patient management decisions.  A negative result may occur with  improper specimen collection / handling, submission of specimen other  than nasopharyngeal swab, presence of viral mutation(s) within the  areas targeted by this assay, and inadequate number of viral copies  (<250 copies / mL). A negative result must be combined with clinical  observations, patient history, and epidemiological information. If result is POSITIVE SARS-CoV-2 target nucleic acids are DETECTED. The SARS-CoV-2 RNA is generally detectable in upper and lower  respiratory specimens dur ing the acute phase of infection.  Positive  results are indicative of active infection with SARS-CoV-2.  Clinical  correlation with patient history and other diagnostic information is  necessary to determine patient infection status.  Positive results do  not rule out bacterial infection or co-infection with other viruses. If result is PRESUMPTIVE POSTIVE SARS-CoV-2 nucleic acids MAY BE PRESENT.   A presumptive positive result was obtained on the submitted specimen  and confirmed on repeat testing.  While 2019 novel  coronavirus  (SARS-CoV-2) nucleic acids may be present in the submitted sample  additional confirmatory testing may be necessary for epidemiological  and / or clinical management purposes  to differentiate between  SARS-CoV-2 and other Sarbecovirus currently known to infect humans.  If clinically indicated additional testing with an alternate test  methodology [phone]) is advised. The SARS-CoV-2 RNA is generally  detectable in upper and lower respiratory sp ecimens during the acute  phase of infection. The expected result is Negative. Fact Sheet for Patients:  StrictlyIdeas.no Fact Sheet for Healthcare Providers: BankingDealers.co.za This test is not yet approved or cleared by the [place] FDA and has been authorized for detection and/or diagnosis of SARS-CoV-2 by FDA under an Emergency Use Authorization (EUA).  This EUA will remain in effect (meaning this test can be used) for the duration of the COVID-19 declaration under Section 564(b)(1) of the Act, 21 U.S.C. section 360bbb-3(b)(1), unless the authorization is terminated or revoked sooner. Performed at [facility], [address] [place], [place] [address]   Protime-INR     Status: None   Collection Time: 06/17/18  2:19 AM  Result Value Ref Range   Prothrombin Time 13.3 11.4 - 15.2 seconds   INR 1.0 0.8 - 1.2    Comment: (NOTE) INR goal varies based on device and disease states. Performed at [facility], [address] [place], [place] [address]   APTT     Status: Abnormal   Collection Time: 06/17/18  2:19 AM  Result Value Ref Range   aPTT <24 (L) 24 - 36 seconds    Comment: Performed at [facility], [address], [place], [place] [address]   Dg Chest 1 View  Result Date: 06/17/2018 CLINICAL DATA:  63 y/o F; weakness. History of smoking, COPD, and lung cancer. EXAM: CHEST  1 VIEW COMPARISON:  03/05/2018 CT chest. FINDINGS: Stable  cardiac silhouette given projection and technique. Prominent pericardial fat pads. Right hilar and upper lobe consolidation is stable compatible with radiation fibrosis. There are increased bronchitic changes in the left mid and lower lung zones. No pleural effusion or pneumothorax. No acute osseous abnormality is evident. IMPRESSION: Increased bronchitic changes in the left mid and lower lung zones. 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 Electronically Signed   By: Kristine Garbe M.D.   On: 06/17/2018 01:40   Ct Head Code Stroke Wo Contrast  Addendum Date: 06/17/2018   ADDENDUM REPORT: 06/17/2018 00:31 ADDENDUM: Study discussed by telephone with Dr. Luvenia Starch SUNG on 06/17/2018 at 0028 hours. Electronically Signed   By: Genevie Ann M.D.   On: 06/17/2018 00:31   Result Date: 06/17/2018 CLINICAL DATA:  Code stroke. 63 year old female with slurred speech and eye pain onset 2 hours ago. History of small cell lung cancer. EXAM: CT HEAD WITHOUT CONTRAST TECHNIQUE: Contiguous axial images were obtained from the base of the skull through the vertex without intravenous contrast. COMPARISON:  Brain MRI 12/12/2016. Head CT 12/28/2015. FINDINGS: Brain: New wedge-shaped hypodensity in the right superior cerebellar artery territory best seen on coronal image 43. No associated hemorrhage. No posterior fossa mass effect. Other posterior fossa gray-white matter differentiation is within normal limits. Mild cerebral volume loss since 2017. No midline shift, or evidence of intracranial mass lesion. No ventriculomegaly. No acute intracranial hemorrhage identified. No cerebral cortically based infarct identified. No cortical encephalomalacia identified. Vascular: Calcified atherosclerosis at the skull base. No suspicious intracranial vascular hyperdensity. Skull: No acute osseous abnormality identified. Sinuses/Orbits: Improved sphenoid sinus aeration since 2017. Other Visualized paranasal sinuses and mastoids are  stable and well pneumatized. Other: Mildly Disconjugate gaze. Negative visible scalp soft tissues. ASPECTS [facility] Stroke Program Early CT Score) - Ganglionic level infarction (caudate, lentiform nuclei, internal capsule, insula, M1-M3 cortex): 7 - Supraganglionic infarction (M4-M6 cortex): 3 Total score (0-10 with 10 being normal): 10 IMPRESSION: 1. Hypodensity in the right superior cerebellum most resembles an acute to subacute Right SCA territory infarct. No associated hemorrhage or mass effect. Given history of small cell lung cancer recommend follow-up Brain MRI to confirm. 2. No supratentorial cortically based infarct identified. ASPECTS 10. Electronically Signed: By: Genevie Ann M.D. On: 06/17/2018 00:17    Pending Labs Unresulted Labs (From admission, onward)    Start     Ordered   06/17/18 0050  Urine Drug Screen, Qualitative ([place] only)  Once,   R     06/17/18 0050   06/17/18 0000  Urinalysis, Routine w reflex microscopic  ONCE - STAT,   STAT     06/17/18 0000   Signed and Held  Hemoglobin A1c  Tomorrow morning,   R     Signed and Held   Signed and Held  Lipid panel  Tomorrow morning,   R    Comments:  Fasting    Signed and Held   Signed and Held  Creatinine, serum  (enoxaparin (LOVENOX)    CrCl >/= 30 ml/min)  Weekly,   R    Comments:  while on enoxaparin therapy    Signed and Held          Vitals/Pain Today's Vitals   06/17/18 0231 06/17/18 0300 06/17/18 0342 06/17/18 0400  BP:  136/86 125/82 (!) 142/93  Pulse:  99 92 (!) 104  Resp:  19 17 19   Temp:      TempSrc:      SpO2:  99% 99% 99%  Weight:  Height:      PainSc: 3        Isolation Precautions No active isolations  Medications Medications  aspirin suppository 300 mg (300 mg Rectal Given 06/17/18 0052)  ondansetron (ZOFRAN) injection 4 mg (4 mg Intravenous Given 06/17/18 0151)  fentaNYL (SUBLIMAZE) injection 50 mcg (50 mcg Intravenous Given 06/17/18 0151)    Mobility walks with person assist High fall  risk   Focused Assessments Neuro Assessment Handoff:  Swallow screen pass? No    NIH Stroke Scale ( + Modified Stroke Scale Criteria)  Interval: Initial Level of Consciousness (1a.)   : Alert, keenly responsive LOC Questions (1b. )   +: Answers both questions correctly LOC Commands (1c. )   + : Performs both tasks correctly Best Gaze (2. )  +: Normal Visual (3. )  +: No visual loss Facial Palsy (4. )    : Minor paralysis Motor Arm, Left (5a. )   +: No drift Motor Arm, Right (5b. )   +: No drift Motor Leg, Left (6a. )   +: No drift Motor Leg, Right (6b. )   +: Drift Limb Ataxia (7. ): Present in one limb Sensory (8. )   +: Mild-to-moderate sensory loss, patient feels pinprick is less sharp or is dull on the affected side, or there is a loss of superficial pain with pinprick, but patient is aware of being touched Best Language (9. )   +: No aphasia Dysarthria (10. ): Mild-to-moderate dysarthria, patient slurs at least some words and, at worst, can be understood with some difficulty Extinction/Inattention (11.)   +: No Abnormality Modified SS Total  +: 2 Complete NIHSS TOTAL: 7 Last date known well: 06/13/18   Neuro Assessment: Within Defined Limits Neuro Checks:   Initial (06/17/18 0023)  Last Documented NIHSS Modified Score: 2 (06/17/18 0348) Has TPA been given? No If patient is a Neuro Trauma and patient is going to OR before floor call report to [place] nurse: [phone] or [phone]     R Recommendations: See Admitting Provider Note  Report given to:   Additional Notes:

## 2018-06-17 NOTE — ED Notes (Signed)
Pt to room 15 via w/c, report given to Dr Beather Arbour and pt cleared for CT

## 2018-06-18 ENCOUNTER — Inpatient Hospital Stay
Admit: 2018-06-18 | Discharge: 2018-06-18 | Disposition: A | Discharge status: Home / Self Care | Payer: Medicare Other | Attending: Internal Medicine | Admitting: Internal Medicine

## 2018-06-18 LAB — GLUCOSE, CAPILLARY
Glucose-Capillary: 122 mg/dL — ABNORMAL HIGH (ref 70–99)
Glucose-Capillary: 112 mg/dL — ABNORMAL HIGH (ref 70–99)
Glucose-Capillary: 116 mg/dL — ABNORMAL HIGH (ref 70–99)
Glucose-Capillary: 117 mg/dL — ABNORMAL HIGH (ref 70–99)

## 2018-06-18 LAB — BASIC METABOLIC PANEL
Anion gap: 8 (ref 5–15)
BUN: 15 mg/dL (ref 8–23)
CO2: 22 mmol/L (ref 22–32)
Calcium: 8.2 mg/dL — ABNORMAL LOW (ref 8.9–10.3)
Chloride: 110 mmol/L (ref 98–111)
Creatinine, Ser: 0.67 mg/dL (ref 0.44–1.00)
GFR calc Af Amer: >60 mL/min (ref >60)
GFR calc non Af Amer: >60 mL/min (ref >60)
Glucose, Bld: 135 mg/dL — ABNORMAL HIGH (ref 70–99)
Potassium: 3.7 mmol/L (ref 3.5–5.1)
Sodium: 140 mmol/L (ref 135–145)

## 2018-06-18 MED ORDER — FUROSEMIDE 10 MG/ML IJ SOLN
40.0000 mg | Freq: Once | INTRAMUSCULAR | Status: AC
  Administered 2018-06-18: 40 mg via INTRAVENOUS
  Filled 2018-06-18: qty 4

## 2018-06-18 MED ORDER — EZETIMIBE 10 MG PO TABS
10.0000 mg | ORAL_TABLET | Freq: Every day | ORAL | Status: DC
  Administered 2018-06-18 – 2018-06-19 (×2): 10 mg via ORAL
  Filled 2018-06-18 (×3): qty 1

## 2018-06-18 MED ORDER — GUAIFENESIN ER 600 MG PO TB12
600.0000 mg | ORAL_TABLET | Freq: Two times a day (BID) | ORAL | Status: DC
  Administered 2018-06-18 – 2018-06-19 (×3): 600 mg via ORAL
  Filled 2018-06-18 (×3): qty 1

## 2018-06-18 MED ORDER — ONDANSETRON HCL 4 MG/2ML IJ SOLN: 4.0000 mg | Freq: Four times a day (QID) | INTRAMUSCULAR | Status: DC | PRN

## 2018-06-18 NOTE — Progress Notes (Signed)
*  PRELIMINARY RESULTS* Echocardiogram 2D Echocardiogram has been performed.  Yvette Riley 06/18/2018, 9:11 AM

## 2018-06-18 NOTE — Progress Notes (Signed)
Physical Therapy Treatment Patient Details Name: Yvette Riley MRN: [id number] DOB: 1955-03-04 Today's Date: 06/18/2018    History of Present Illness Patient is a pleasant 63 year old female who presented to Ed for sudden vomiting and blurred vision with noticeable change in balance, imaging revelaed a subacute lesion in R superior cerebellar artery. Patient reports she has been unsteady for a few days with last "normal day" being 5/16 however has been unsteady with her balance for the past 3 years. Reports 5-6 falls in the past two months. PMh includes lung cancer (chemo and radiation), DM, HTN, COPD, anemia, anxiety, CVA, and glaucoma.    PT Comments    Pt ready for session.  Asking to walk into bathroom "I'd like to try."  Participated in exercises as described below.  Pt to edge of bed with rail and min guard.  Generally steady in sitting with 1 UE support.  Stood to walker with min assist and required close min a for marching in place x 10 with generally unsteady steps and fatigued quickly "I'm going to fall back onto the bed."  Pt seems unaware of deficits even after discussion regarding her inability to march in place for a short period of time before fatigue and LOB.  Stated she did not have to go to the bathroom but just wanted to walk.  Stood a second time and was able to transfer with walker to recliner at bedside.  Pt with poor steps and more or less just turned her body before sitting in recliner.  Pt requires hands on assist at all times for mobility due to safety and balance.   Discussed at length decision making regarding CIR vs SNF.  Pt remains firm in her decision to decline CIR and transition to SNF.  Pt does not seem to understand differences as she stated she does not want CIR in [place] because they are "All trained by [facility]."  Education provided regarding differences and staffing.  She remained firm then stated her family member works at [employer] and stated that she hopes she  will be able to stay at [facility] and go over to [facility] "to wok out."  Attempted to explain procedure and expectations and limitations in regards to Covid-19 and inability to travel to other facilities when admitted to one and her inability to have visitors in any facility but she remained firm.  Discussed with nurse tech and encouraged +2 assist for safety with transfers.  Limit to transfers only.  HR generally stable this session but did increase to 130 with transition to sitting EOB but returned to baseline shortly after.   Follow Up Recommendations  CIR     Equipment Recommendations       Recommendations for Other Services       Precautions / Restrictions Precautions Precautions: Fall Restrictions Weight Bearing Restrictions: No    Mobility  Bed Mobility Overal bed mobility: Needs Assistance Bed Mobility: Supine to Sit;Sit to Supine     Supine to sit: Supervision;HOB elevated        Transfers   Equipment used: Rolling walker (2 wheeled) Transfers: Sit to/from Stand Sit to Stand: Mod assist;Min assist         General transfer comment: unsteady and sits prior to turning fully.  Tech reports poor transfers to bedside commode.  Encouraged +2 assist for safety.  Ambulation/Gait             General Gait Details: unable to transfer to chair  at bedside with turning motion but very poor steps while turning.   Stairs             Wheelchair Mobility    Modified Rankin (Stroke Patients Only)       Balance Overall balance assessment: Needs assistance;History of Falls Sitting-balance support: Bilateral upper extremity supported;Feet supported Sitting balance-Leahy Scale: Fair     Standing balance support: Bilateral upper extremity supported Standing balance-Leahy Scale: Poor Standing balance comment: Patient is very unsteady in standing with RW with poor weight shift, requires BUE support                              Cognition Arousal/Alertness: Awake/alert Behavior During Therapy: WFL for tasks assessed/performed Overall Cognitive Status: Within Functional Limits for tasks assessed                                        Exercises Other Exercises Other Exercises: Supine ex x 10 for SLR, heel slides and bridges    General Comments        Pertinent Vitals/Pain Pain Assessment: Faces Faces Pain Scale: Hurts whole lot Pain Location: behind R eye and headache (R posterior) Pain Descriptors / Indicators: Aching;Discomfort Pain Intervention(s): Limited activity within patient's tolerance;Monitored during session    Home Living                      Prior Function            PT Goals (current goals can now be found in the care plan section) Progress towards PT goals: Progressing toward goals    Frequency    7X/week      PT Plan Current plan remains appropriate    Co-evaluation              AM-PAC PT "6 Clicks" Mobility   Outcome Measure  Help needed turning from your back to your side while in a flat bed without using bedrails?: A Little Help needed moving from lying on your back to sitting on the side of a flat bed without using bedrails?: A Lot Help needed moving to and from a bed to a chair (including a wheelchair)?: A Lot Help needed standing up from a chair using your arms (e.g., wheelchair or bedside chair)?: A Little Help needed to walk in hospital room?: Total Help needed climbing 3-5 steps with a railing? : Total 6 Click Score: 12    End of Session Equipment Utilized During Treatment: Gait belt Activity Tolerance: Patient limited by fatigue Patient left: in chair;with call bell/phone within reach;with chair alarm set Nurse Communication: Other (comment);Mobility status       Time: 1232-1256 PT Time Calculation (min) (ACUTE ONLY): 24 min  Charges:  $Therapeutic Exercise: 8-22 mins $Therapeutic Activity: 8-22 mins                      Chesley Noon, PTA 06/18/18, 1:09 PM

## 2018-06-18 NOTE — NC FL2 (Signed)
[place] LEVEL OF CARE SCREENING TOOL     IDENTIFICATION  Patient Name: Yvette Riley Birthdate: [birth year] Sex: female Admission Date (Current Location): 06/17/2018  [place] and [place] Number:  Engineering geologist and Address:  [facility], [address], [place], [place] [address]      Provider Number: [id number]  Attending Physician Name and Address:  Gladstone Lighter, MD  Relative Name and Phone Number:       Current Level of Care: Hospital Recommended Level of Care: [place] Prior Approval Number:    Date Approved/Denied:   PASRR Number: [id number]  Discharge Plan: SNF    Current Diagnoses: Patient Active Problem List   Diagnosis Date Noted  . CVA (cerebral vascular accident) ([place]) 06/17/2018  . Chronic cough 06/02/2018  . Anxiety 07/19/2016  . Small cell lung cancer, right ([place]) 05/13/2016  . Bilateral presbyopia 05/06/2016  . Combined forms of age-related cataract of both eyes 05/06/2016  . Dry eye syndrome of both lacrimal glands 05/06/2016  . Chemoprophylaxis 04/29/2016  . Severe obesity (BMI 35.0-39.9) with comorbidity ([place]) 04/16/2016  . Elevated d-dimer 04/06/2016  . Weakness generalized 04/06/2016  . Sepsis ([place]) 03/17/2016  . Mass of upper lobe of right lung 03/01/2016  . Coronary atherosclerosis 02/29/2016  . Personal history of tobacco use, presenting hazards to health 02/28/2016  . Allergic rhinitis 09/27/2014  . Asthma 09/27/2014  . Anemia 09/27/2014  . Hyperglyceridemia, pure 09/27/2014  . Glaucoma 09/27/2014  . Hip pain 09/27/2014  . Right knee pain 09/27/2014  . Displacement of lumbar intervertebral disc without myelopathy 09/27/2014  . Thoracic back pain 09/27/2014  . Insomnia 08/11/2014  . COPD (chronic obstructive pulmonary disease) ([place]) 06/12/2014  . Type 2 diabetes mellitus ([place]) 06/12/2014  . Back pain 06/12/2014  . Essential hypertension 03/28/2014  . Smoking  greater than 30 pack years 03/28/2014    Orientation RESPIRATION BLADDER Height & Weight     Self, Time, Situation, Place  Normal Continent Weight: 180 lb (81.6 kg) Height:  4\' 11"  (149.9 cm)  BEHAVIORAL SYMPTOMS/MOOD NEUROLOGICAL BOWEL NUTRITION STATUS  (none) (none) Continent Diet(Heart Healthy )  AMBULATORY STATUS COMMUNICATION OF NEEDS Skin   Extensive Assist Verbally Normal                       Personal Care Assistance Level of Assistance  Bathing, Feeding, Dressing Bathing Assistance: Limited assistance Feeding assistance: Independent Dressing Assistance: Limited assistance     Functional Limitations Info  Sight, Hearing, Speech Sight Info: Adequate Hearing Info: Adequate Speech Info: Adequate    SPECIAL CARE FACTORS FREQUENCY  PT (By licensed PT), OT (By licensed OT)     PT Frequency: 5 OT Frequency: 5            Contractures Contractures Info: Not present    Additional Factors Info  Code Status, Allergies Code Status Info: Full Code  Allergies Info: Codeine, Crestor, Penicillins, Yellow Jacket Venom Bee Venom, Gemfibrozil, Statins, Trazodone And Nefazodone, Lipitor            Current Medications (06/18/2018):  This is the current hospital active medication list Current Facility-Administered Medications  Medication Dose Route Frequency Provider Last Rate Last Dose  . acetaminophen (TYLENOL) tablet 650 mg  650 mg Oral Q4H PRN Harrie Foreman, MD       Or  . acetaminophen (TYLENOL) solution 650 mg  650 mg Per Tube Q4H PRN Harrie Foreman, MD  Or  . acetaminophen (TYLENOL) suppository 650 mg  650 mg Rectal Q4H PRN Harrie Foreman, MD      . albuterol (PROVENTIL) (2.5 MG/3ML) 0.083% nebulizer solution 2.5 mg  2.5 mg Nebulization Q6H PRN Harrie Foreman, MD      . aspirin chewable tablet 81 mg  81 mg Oral Daily Gladstone Lighter, MD   81 mg at 06/18/18 7408  . clonazePAM (KLONOPIN) tablet 0.5 mg  0.5 mg Oral BID Gladstone Lighter,  MD   0.5 mg at 06/18/18 0808  . clopidogrel (PLAVIX) tablet 75 mg  75 mg Oral Daily Gladstone Lighter, MD   75 mg at 06/18/18 0808  . enoxaparin (LOVENOX) injection 40 mg  40 mg Subcutaneous Q24H Harrie Foreman, MD   40 mg at 06/18/18 0454  . furosemide (LASIX) injection 40 mg  40 mg Intravenous Once Gladstone Lighter, MD      . gabapentin (NEURONTIN) capsule 300 mg  300 mg Oral TID Gladstone Lighter, MD   300 mg at 06/18/18 0808  . gadobutrol (GADAVIST) 1 MMOL/ML injection 8 mL  8 mL Intravenous Once PRN Harrie Foreman, MD      . guaiFENesin (MUCINEX) 12 hr tablet 600 mg  600 mg Oral BID Gladstone Lighter, MD      . insulin aspart (novoLOG) injection 0-5 Units  0-5 Units Subcutaneous QHS Harrie Foreman, MD      . insulin aspart (novoLOG) injection 0-9 Units  0-9 Units Subcutaneous TID WC Harrie Foreman, MD   1 Units at 06/18/18 0809  . ketorolac (TORADOL) 15 MG/ML injection 15 mg  15 mg Intravenous Q6H PRN Gladstone Lighter, MD   15 mg at 06/18/18 0810  . MEDLINE mouth rinse  15 mL Mouth Rinse BID Gladstone Lighter, MD   15 mL at 06/17/18 2019  . metFORMIN (GLUCOPHAGE-XR) 24 hr tablet 1,000 mg  1,000 mg Oral Q breakfast Gladstone Lighter, MD   1,000 mg at 06/18/18 0808  . ondansetron (ZOFRAN) injection 4 mg  4 mg Intravenous Q6H PRN Gladstone Lighter, MD      . oxyCODONE-acetaminophen (PERCOCET/ROXICET) 5-325 MG per tablet 1 tablet  1 tablet Oral Q6H PRN Gladstone Lighter, MD   1 tablet at 06/18/18 0641   And  . oxyCODONE (Oxy IR/ROXICODONE) immediate release tablet 5 mg  5 mg Oral Q6H PRN Gladstone Lighter, MD   5 mg at 06/18/18 0641  . ramelteon (ROZEREM) tablet 8 mg  8 mg Oral QHS Gladstone Lighter, MD   8 mg at 06/17/18 2017  . senna-docusate (Senokot-S) tablet 1 tablet  1 tablet Oral QHS PRN Harrie Foreman, MD      . umeclidinium-vilanterol [facility] ELLIPTA) 62.5-25 MCG/INH 1 puff  1 puff Inhalation Daily Harrie Foreman, MD   1 puff at 06/18/18 1448      Discharge Medications: Please see discharge summary for a list of discharge medications.  Relevant Imaging Results:  Relevant Lab Results:   Additional Information SSN: [id number]  Annamaria Boots, [place]

## 2018-06-18 NOTE — Progress Notes (Signed)
[place] [facility] [place] NAME: Yvette Riley    MR#:  [id number]  DATE OF BIRTH:  06-06-1955  SUBJECTIVE:  CHIEF COMPLAINT:   Chief Complaint  Patient presents with   Headache   Dizziness   -MRI positive for acute right cerebellar infarct. -Patient complains of headache and nausea today -She has refused to go to acute inpatient rehab  REVIEW OF SYSTEMS:  Review of Systems  Constitutional: Negative for chills, fever and malaise/fatigue.  HENT: Negative for congestion, ear discharge, hearing loss and nosebleeds.   Eyes: Negative for blurred vision and double vision.  Respiratory: Positive for cough. Negative for shortness of breath and wheezing.   Cardiovascular: Negative for chest pain and palpitations.  Gastrointestinal: Positive for nausea. Negative for abdominal pain, constipation, diarrhea and vomiting.  Genitourinary: Negative for dysuria.  Musculoskeletal: Negative for myalgias.  Neurological: Positive for focal weakness and headaches. Negative for dizziness, tingling and seizures.  Psychiatric/Behavioral: Negative for depression.    DRUG ALLERGIES:   Allergies  Allergen Reactions   Codeine Anaphylaxis   Crestor [Rosuvastatin] Anaphylaxis   Other Anaphylaxis   Penicillins Anaphylaxis, Rash and Other (See Comments)    Reaction:  Tongue swelling  Has patient had a PCN reaction causing immediate rash, facial/tongue/throat swelling, SOB or lightheadedness with hypotension:  Yes   Has patient had a PCN reaction causing severe rash involving mucus membranes or skin necrosis: No Has patient had a PCN reaction that required hospitalization No Has patient had a PCN reaction occurring within the last 10 years: No If all of the above answers are "NO", then may proceed with Cephalosporin use.   Yellow Jacket Venom [Bee Venom] Anaphylaxis   Gemfibrozil Rash, Nausea And Vomiting and Swelling   Statins Nausea And Vomiting and  Swelling   Trazodone And Nefazodone Nausea And Vomiting   Lipitor [Atorvastatin] Rash    VITALS:  Blood pressure (!) 150/95, pulse (!) 103, temperature 98.1 F (36.7 C), temperature source Oral, resp. rate 17, height 4\' 11"  (1.499 m), weight 81.6 kg, SpO2 95 %.  PHYSICAL EXAMINATION:  Physical Exam   GENERAL:  62 y.o.-year-old patient lying in the bed with no acute distress.  EYES: Pupils equal, round, reactive to light and accommodation. No scleral icterus. Extraocular muscles intact.  HEENT: Head atraumatic, normocephalic. Oropharynx and nasopharynx clear.  NECK:  Supple, no jugular venous distention. No thyroid enlargement, no tenderness.  LUNGS: Normal breath sounds bilaterally, no wheezing, rales,rhonchi or crepitation. No use of accessory muscles of respiration.  Decreased bibasilar breath sounds CARDIOVASCULAR: S1, S2 normal. No  rubs, or gallops.  2/6 systolic murmur is present ABDOMEN: Soft, nontender, nondistended. Bowel sounds present. No organomegaly or mass.  EXTREMITIES: No pedal edema, cyanosis, or clubbing.  NEUROLOGIC: Cranial nerves II through XII are intact.  Speech is clear, does not have her dentures now.  Muscle strength is 5/5 in left upper and lower extremities.  Right lower extremity is 4/5 and right upper extremity is 5/5 with significant drift on extension.. Sensation intact. Gait not checked.  PSYCHIATRIC: The patient is alert and oriented x 3.  SKIN: No obvious rash, lesion, or ulcer.    LABORATORY PANEL:   CBC Recent Labs  Lab 06/17/18 0019  WBC 9.5  HGB 13.0  HCT 40.4  PLT 295   ------------------------------------------------------------------------------------------------------------------  Chemistries  Recent Labs  Lab 06/17/18 0019 06/18/18 0308  NA 139 140  K 3.6 3.7  CL 107 110  CO2 19* 22  GLUCOSE 161* 135*  BUN 12 15  CREATININE 0.67 0.67  CALCIUM 9.2 8.2*  AST 21  --   ALT 15  --   ALKPHOS 77  --   BILITOT 0.7  --     ------------------------------------------------------------------------------------------------------------------  Cardiac Enzymes Recent Labs  Lab 06/17/18 0019  TROPONINI <0.03   ------------------------------------------------------------------------------------------------------------------  RADIOLOGY:  Dg Chest 1 View  Result Date: 06/17/2018 CLINICAL DATA:  63 y/o F; weakness. History of smoking, COPD, and lung cancer. EXAM: CHEST  1 VIEW COMPARISON:  03/05/2018 CT chest. FINDINGS: Stable cardiac silhouette given projection and technique. Prominent pericardial fat pads. Right hilar and upper lobe consolidation is stable compatible with radiation fibrosis. There are increased bronchitic changes in the left mid and lower lung zones. No pleural effusion or pneumothorax. No acute osseous abnormality is evident. IMPRESSION: Increased bronchitic changes in the left mid and lower lung zones. 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 Electronically Signed   By: Kristine Garbe M.D.   On: 06/17/2018 01:40   Mr Brain Wo Contrast  Result Date: 06/17/2018 CLINICAL DATA:  Vomiting and blurred vision EXAM: MRI HEAD WITHOUT CONTRAST TECHNIQUE: Multiplanar, multiecho pulse sequences of the brain and surrounding structures were obtained without intravenous contrast. COMPARISON:  Head CT from earlier today FINDINGS: Brain: Sizable acute infarct in the upper right cerebellum, superior cerebellar territory. No hemorrhagic conversion. No infarct in the separate distribution. There is history of lung cancer. No masslike findings. Mild chronic small vessel ischemic change in the periventricular white matter. No hydrocephalus, collection, or age advanced volume loss. Coronal T2 was not acquired/not available. Vascular: Major flow voids are preserved, including in the basilar. Skull and upper cervical spine: Negative for marrow lesion Sinuses/Orbits: Negative IMPRESSION: 1. Acute right  superior cerebellar infarct. The fourth ventricle is widely patent. 2. Background of mild chronic small vessel ischemia. Electronically Signed   By: Monte Fantasia M.D.   On: 06/17/2018 10:34   US Carotid Bilateral (at [facility] And Ap Only)  Result Date: 06/17/2018 CLINICAL DATA:  CVA. History of hypertension, hyperlipidemia and diabetes. Former smoker. EXAM: BILATERAL CAROTID DUPLEX ULTRASOUND TECHNIQUE: Pearline Cables scale imaging, color Doppler and duplex ultrasound were performed of bilateral carotid and vertebral arteries in the neck. COMPARISON:  None. FINDINGS: Criteria: Quantification of carotid stenosis is based on velocity parameters that correlate the residual internal carotid diameter with NASCET-based stenosis levels, using the diameter of the distal internal carotid lumen as the denominator for stenosis measurement. The following velocity measurements were obtained: RIGHT ICA: 50/12 cm/sec CCA: 93/81 cm/sec SYSTOLIC ICA/CCA RATIO:  0.8 ECA: 61 cm/sec LEFT ICA: 56/18 cm/sec CCA: 82/99 cm/sec SYSTOLIC ICA/CCA RATIO:  0.9 ECA: 91 cm/sec RIGHT CAROTID ARTERY: There is no grayscale evidence of significant intimal thickening or atherosclerotic plaque affecting interrogated portions of the right carotid system. There are no elevated peak systolic velocities within the interrogated course of the right internal carotid artery to suggest a hemodynamically significant stenosis. RIGHT VERTEBRAL ARTERY:  Antegrade Flow LEFT CAROTID ARTERY: There is no grayscale evidence of significant intimal thickening or atherosclerotic plaque affecting the interrogated portions of the left carotid system. There are no elevated peak systolic velocities within the interrogated course of the left internal carotid artery to suggest a hemodynamically significant stenosis. LEFT VERTEBRAL ARTERY:  Antegrade Flow IMPRESSION: Unremarkable carotid Doppler ultrasound. Electronically Signed   By: Sandi Mariscal M.D.   On: 06/17/2018 15:04   Mr Jodene Nam  Head/brain BZ Cm  Result Date: 06/17/2018 CLINICAL DATA:  Initial  evaluation for acute stroke. EXAM: MRA HEAD WITHOUT CONTRAST TECHNIQUE: Angiographic images of the Circle of Willis were obtained using MRA technique without intravenous contrast. COMPARISON:  Prior CT and MRI from earlier the same day. FINDINGS: ANTERIOR CIRCULATION: Visualized cervical segments of the internal carotid arteries are widely patent with symmetric antegrade flow. Petrous, cavernous, and supraclinoid segments widely patent without hemodynamically significant stenosis. A1 segments widely patent. Normal anterior communicating artery. Anterior cerebral arteries well perfused to their distal aspects. No M1 stenosis or occlusion. Normal MCA bifurcations. Distal MCA branches well perfused and symmetric. POSTERIOR CIRCULATION: Vertebral arteries patent to the vertebrobasilar junction without flow-limiting stenosis. Left vertebral artery dominant. Patent left PICA. Right PICA not seen. Dominant right anterior inferior cerebral artery. Basilar patent to its distal aspect without stenosis. Left superior cerebral artery patent to its distal aspect. Probable focal occlusion of the distal right SCA (series 1025, image 14), compatible with acute right cerebellar infarct. Right PCA supplied via the basilar and is well perfused to its distal aspect. Predominant fetal type origin of the left PCA, also widely patent to its distal aspect. No intracranial aneurysm or other vascular abnormality. IMPRESSION: 1. Occlusion of the distal right superior cerebral artery, compatible with acute right cerebellar infarct. 2. Otherwise negative intracranial MRA with no other large vessel occlusion or hemodynamically significant stenosis. Electronically Signed   By: Jeannine Boga M.D.   On: 06/17/2018 13:55   Ct Head Code Stroke Wo Contrast  Addendum Date: 06/17/2018   ADDENDUM REPORT: 06/17/2018 00:31 ADDENDUM: Study discussed by telephone with Dr. Luvenia Starch  SUNG on 06/17/2018 at 0028 hours. Electronically Signed   By: Genevie Ann M.D.   On: 06/17/2018 00:31   Result Date: 06/17/2018 CLINICAL DATA:  Code stroke. 63 year old female with slurred speech and eye pain onset 2 hours ago. History of small cell lung cancer. EXAM: CT HEAD WITHOUT CONTRAST TECHNIQUE: Contiguous axial images were obtained from the base of the skull through the vertex without intravenous contrast. COMPARISON:  Brain MRI 12/12/2016. Head CT 12/28/2015. FINDINGS: Brain: New wedge-shaped hypodensity in the right superior cerebellar artery territory best seen on coronal image 43. No associated hemorrhage. No posterior fossa mass effect. Other posterior fossa gray-white matter differentiation is within normal limits. Mild cerebral volume loss since 2017. No midline shift, or evidence of intracranial mass lesion. No ventriculomegaly. No acute intracranial hemorrhage identified. No cerebral cortically based infarct identified. No cortical encephalomalacia identified. Vascular: Calcified atherosclerosis at the skull base. No suspicious intracranial vascular hyperdensity. Skull: No acute osseous abnormality identified. Sinuses/Orbits: Improved sphenoid sinus aeration since 2017. Other Visualized paranasal sinuses and mastoids are stable and well pneumatized. Other: Mildly Disconjugate gaze. Negative visible scalp soft tissues. ASPECTS [facility] Stroke Program Early CT Score) - Ganglionic level infarction (caudate, lentiform nuclei, internal capsule, insula, M1-M3 cortex): 7 - Supraganglionic infarction (M4-M6 cortex): 3 Total score (0-10 with 10 being normal): 10 IMPRESSION: 1. Hypodensity in the right superior cerebellum most resembles an acute to subacute Right SCA territory infarct. No associated hemorrhage or mass effect. Given history of small cell lung cancer recommend follow-up Brain MRI to confirm. 2. No supratentorial cortically based infarct identified. ASPECTS 10. Electronically Signed: By: Genevie Ann  M.D. On: 06/17/2018 00:17    EKG:   Orders placed or performed during the hospital encounter of 06/17/18   ED EKG   ED EKG   EKG 12-Lead   EKG 12-Lead    ASSESSMENT AND PLAN:   63 year old female with past medical history significant for  severe arthritis, anemia, history of small cell lung cancer in remission, COPD and emphysema and diabetes presents from home secondary to worsening gait and blurred vision.  1.  Acute CVA-significant right-sided weakness with right arm drift noted on exam.  MRI of the brain showing right cerebellar infarct. -Appreciate neuro input.  Carotid Dopplers with no hemodynamically significant stenosis.  MRI of the brain showing occlusion of the distal right superior cerebral artery compatible with acute right cerebellar infarct. -Neurology recommended aspirin and Plavix for 1 month and then only Plavix after that. - patient allergic to statins.  LDL of 75 -Appreciate PT and OT consults.  They have recommended acute inpatient rehab however patient has refused to go to acute inpatient rehab and wants to go to short-term rehab.  2.  Diabetes mellitus-A1c of 6.3.  Metformin and sliding scale insulin for now  3.  Neuropathy-continue gabapentin chronic pain medications  4.  Anxiety-on Klonopin  5.  DVT prophylaxis-Lovenox  6. Chronic cough-chest x-ray with chronic bronchitis changes and right-sided radiation fibrosis changes. -discontinue IV fluids and give a dose of Lasix today.  will need rehab at discharge   All the records are reviewed and case discussed with Care Management/Social Workerr. Management plans discussed with the patient, family and they are in agreement.  CODE STATUS: Full code  TOTAL TIME TAKING CARE OF THIS PATIENT: 38 minutes.   POSSIBLE D/C IN 1-2 DAYS, DEPENDING ON CLINICAL CONDITION.   Gl[place] Lighter M.D on 06/18/2018 at 9:31 AM  Between 7am to 6pm - Pager - [phone]  After 6pm go to [email or web address] - password [email or web address]  [facility]  Office  [phone]  CC: Primary care physician; Birdie Sons, MD

## 2018-06-18 NOTE — TOC Initial Note (Signed)
Transition of Care [facility]) - Initial/Assessment Note    Patient Details  Name: Yvette Riley MRN: [id number] Date of Birth: Jun 21, 1955  Transition of [facility]) CM/SW Contact:    Annamaria Boots, [place] Phone Number: 06/18/2018, 11:14 AM  Clinical Narrative:  CSW notified by CIR representative that patient has refused CIR. CSW met with patient to discuss discharge planning. Patient reports that she lives with her adult son and that she is usually independent. Patient reports that she does not want to go to CIR because it is too far away from her family and would like to stay local. Patient understands that she would not be able to have visitors in any facility but she still wants to remain close. CSW presented bed offers and patient chose bed at [facility]. CSW notified Claiborne Billings at [facility] of bed acceptance. CSW will continue to follow for discharge planning.                  Expected Discharge Plan: [place] Barriers to Discharge: Continued Medical Work up   Patient Goals and CMS Choice Patient states their goals for this hospitalization and ongoing recovery are:: I dont want to go to [place]. I want to stay in town  CMS Medicare.gov Compare Post Acute Care list provided to:: Patient Choice offered to / list presented to : Patient  Expected Discharge Plan and Services Expected Discharge Plan: [place] Choice: [place] arrangements for the past 2 months: Single Family Home Expected Discharge Date: 06/22/18                                    Prior Living Arrangements/Services Living arrangements for the past 2 months: Single Family Home Lives with:: Adult Children Patient language and need for interpreter reviewed:: Yes Do you feel safe going back to the place where you live?: Yes      Need for Family Participation in Patient Care: Yes (Comment) Care giver support system in  place?: Yes (comment)   Criminal Activity/Legal Involvement Pertinent to Current Situation/Hospitalization: No - Comment as needed  Activities of Daily Living Home Assistive Devices/Equipment: None, Cane (specify quad or straight), Walker (specify type), CBG Meter, Oxygen, Dentures (specify type), Eyeglasses ADL Screening (condition at time of admission) Patient's cognitive ability adequate to safely complete daily activities?: Yes Is the patient deaf or have difficulty hearing?: No Does the patient have difficulty seeing, even when wearing glasses/contacts?: No Does the patient have difficulty concentrating, remembering, or making decisions?: No Patient able to express need for assistance with ADLs?: Yes Does the patient have difficulty dressing or bathing?: Yes Independently performs ADLs?: No Communication: Independent Dressing (OT): Needs assistance Is this a change from baseline?: Change from baseline, expected to last >3 days Grooming: Needs assistance Is this a change from baseline?: Change from baseline, expected to last >3 days Feeding: Independent Bathing: Needs assistance Is this a change from baseline?: Change from baseline, expected to last >3 days Toileting: Needs assistance Is this a change from baseline?: Change from baseline, expected to last >3days In/Out Bed: Needs assistance Is this a change from baseline?: Change from baseline, expected to last >3 days Walks in Home: Needs assistance Is this a change from baseline?: Change from baseline, expected to last >3 days Does the patient have difficulty walking or climbing stairs?: Yes Weakness of Legs: Right  Weakness of Arms/Hands: Right  Permission Sought/Granted Permission sought to share information with : Case Manager, Customer service manager, Family Supports Permission granted to share information with : Yes, Verbal Permission Granted              Emotional Assessment Appearance:: Appears stated  age   Affect (typically observed): Accepting, Hopeful Orientation: : Oriented to Self, Oriented to Place, Oriented to  Time, Oriented to Situation Alcohol / Substance Use: Not Applicable Psych Involvement: No (comment)  Admission diagnosis:  Cerebrovascular accident (CVA), unspecified mechanism ([place]) [I63.9] Patient Active Problem List   Diagnosis Date Noted  . CVA (cerebral vascular accident) ([place]) 06/17/2018  . Chronic cough 06/02/2018  . Anxiety 07/19/2016  . Small cell lung cancer, right ([place]) 05/13/2016  . Bilateral presbyopia 05/06/2016  . Combined forms of age-related cataract of both eyes 05/06/2016  . Dry eye syndrome of both lacrimal glands 05/06/2016  . Chemoprophylaxis 04/29/2016  . Severe obesity (BMI 35.0-39.9) with comorbidity ([place]) 04/16/2016  . Elevated d-dimer 04/06/2016  . Weakness generalized 04/06/2016  . Sepsis ([place]) 03/17/2016  . Mass of upper lobe of right lung 03/01/2016  . Coronary atherosclerosis 02/29/2016  . Personal history of tobacco use, presenting hazards to health 02/28/2016  . Allergic rhinitis 09/27/2014  . Asthma 09/27/2014  . Anemia 09/27/2014  . Hyperglyceridemia, pure 09/27/2014  . Glaucoma 09/27/2014  . Hip pain 09/27/2014  . Right knee pain 09/27/2014  . Displacement of lumbar intervertebral disc without myelopathy 09/27/2014  . Thoracic back pain 09/27/2014  . Insomnia 08/11/2014  . COPD (chronic obstructive pulmonary disease) ([place]) 06/12/2014  . Type 2 diabetes mellitus ([place]) 06/12/2014  . Back pain 06/12/2014  . Essential hypertension 03/28/2014  . Smoking greater than 30 pack years 03/28/2014   PCP:  Birdie Sons, MD Pharmacy:   [facility] [address]), [place] [address] Phone: [phone] Fax: [place], [place] [address] [place] [place] [address] Phone: [phone] Fax:  [phone]  [place] [address] [place] [address] Phone: [phone] Fax: [phone]  [id number] [place], [place] [address] Phone: [phone] Fax: [phone]     Social Determinants of Health (SDOH) Interventions    Readmission Risk Interventions No flowsheet data found.

## 2018-06-18 NOTE — Progress Notes (Signed)
Occupational Therapy Treatment Patient Details Name: Yvette Riley MRN: [id number] DOB: [birth year] Today's Date: 06/18/2018    History of present illness Patient is a pleasant 63 year old female who presented to [facility] for sudden vomiting and blurred vision with noticeable change in balance, imaging revelaed a subacute lesion in R superior cerebellar artery. Patient reports she has been unsteady for a few days with last "normal day" being 5/16 however has been unsteady with her balance for the past 3 years. Reports 5-6 falls in the past two months. PMh includes lung cancer (chemo and radiation), DM, HTN, COPD, anemia, anxiety, CVA, and glaucoma.   OT comments  Pt seen for OT tx this date. Pt reports being ready to return to bed despite having recently gotten up to recliner with PT. Pt requests to use bathroom first. CGA to Min A for toilet transfers to [facility] with cues for safety as pt tends to sit prior to reaching back with UEs for support. CGA in standing for pt to perform hygiene. Impaired balance, cues required and Min A at times to correct for LOB posteriorly. Pt continues to benefit from skilled OT, continue to recommend CIR, however, it appears that pt declines this setting.   Follow Up Recommendations  CIR    Equipment Recommendations  3 in 1 bedside commode    Recommendations for Other Services      Precautions / Restrictions Precautions Precautions: Fall Restrictions Weight Bearing Restrictions: No       Mobility Bed Mobility Overal bed mobility: Needs Assistance Bed Mobility: Sit to Supine     Sit to supine: Supervision      Transfers Overall transfer level: Needs assistance Equipment used: Rolling walker (2 wheeled) Transfers: Sit to/from Stand Sit to Stand: Min assist;Min guard         General transfer comment: cues for safety    Balance Overall balance assessment: Needs assistance;History of Falls Sitting-balance support: Bilateral upper extremity  supported;Feet supported Sitting balance-Leahy Scale: Fair   Postural control: Posterior lean Standing balance support: Bilateral upper extremity supported Standing balance-Leahy Scale: Poor Standing balance comment: unsteady, slight LOB posteriorly and pt required Min A and cues to correct                           ADL either performed or assessed with clinical judgement   ADL Overall ADL's : Needs assistance/impaired                         Toilet Transfer: Ambulation;BSC;RW;Minimal assistance;Min Psychiatric nurse Details (indicate cue type and reason): CGA to Min A for transfer, cues for hand placement to improve safety Toileting- Clothing Manipulation and Hygiene: Min guard;Sit to/from stand               Vision Baseline Vision/History: Wears glasses Wears Glasses: At all times Patient Visual Report: Blurring of vision(intermittent blurry vision in both eyes) Vision Assessment?: Vision impaired- to be further tested in functional context Additional Comments: pt denies blurriness during session   Perception     Praxis      Cognition Arousal/Alertness: Awake/alert Behavior During Therapy: WFL for tasks assessed/performed Overall Cognitive Status: Within Functional Limits for tasks assessed                                 General Comments: grossly WFL, requires cues for safety  during mobility, slightly decreased safety awareness        Exercises    Shoulder Instructions       General Comments      Pertinent Vitals/ Pain       Pain Assessment: No/denies pain Faces Pain Scale: Hurts whole lot Pain Location: behind R eye and headache (R posterior) Pain Descriptors / Indicators: Aching;Discomfort Pain Intervention(s): Monitored during session;Limited activity within patient's tolerance  Home Living                                          Prior Functioning/Environment              Frequency   Min 3X/week        Progress Toward Goals  OT Goals(current goals can now be found in the care plan section)  Progress towards OT goals: Progressing toward goals  Acute Rehab OT Goals Patient Stated Goal: return to PLOF OT Goal Formulation: With patient Time For Goal Achievement: 07/01/18 Potential to Achieve Goals: Good  Plan Discharge plan remains appropriate;Frequency remains appropriate    Co-evaluation                 AM-PAC OT "6 Clicks" Daily Activity     Outcome Measure   Help from another person eating meals?: None Help from another person taking care of personal grooming?: A Little Help from another person toileting, which includes using toliet, bedpan, or urinal?: A Little Help from another person bathing (including washing, rinsing, drying)?: A Lot Help from another person to put on and taking off regular upper body clothing?: A Little Help from another person to put on and taking off regular lower body clothing?: A Lot 6 Click Score: 17    End of Session Equipment Utilized During Treatment: Gait belt;Rolling walker  OT Visit Diagnosis: Other abnormalities of gait and mobility (R26.89);Repeated falls (R29.6);Dizziness and giddiness (R42);Hemiplegia and hemiparesis;Low vision, both eyes (H54.2) Hemiplegia - Right/Left: Right Hemiplegia - dominant/non-dominant: Dominant Hemiplegia - caused by: Cerebral infarction   Activity Tolerance Patient tolerated treatment well   Patient Left in bed;with call bell/phone within reach;with bed alarm set;with SCD's reapplied   Nurse Communication          Time: 1829-9371 OT Time Calculation (min): 15 min  Charges: OT General Charges $OT Visit: 1 Visit OT Treatments $Self Care/Home Management : 8-22 mins  Jeni Salles, MPH, MS, OTR/L ascom [phone] 06/18/18, 1:55 PM

## 2018-06-19 DIAGNOSIS — M5442 Lumbago with sciatica, left side: Secondary | ICD-10-CM | POA: Diagnosis not present

## 2018-06-19 DIAGNOSIS — Z720 Tobacco use: Secondary | ICD-10-CM | POA: Diagnosis not present

## 2018-06-19 DIAGNOSIS — J42 Unspecified chronic bronchitis: Secondary | ICD-10-CM | POA: Diagnosis not present

## 2018-06-19 DIAGNOSIS — Z8673 Personal history of transient ischemic attack (TIA), and cerebral infarction without residual deficits: Secondary | ICD-10-CM | POA: Diagnosis not present

## 2018-06-19 DIAGNOSIS — M2550 Pain in unspecified joint: Secondary | ICD-10-CM | POA: Diagnosis not present

## 2018-06-19 DIAGNOSIS — C3491 Malignant neoplasm of unspecified part of right bronchus or lung: Secondary | ICD-10-CM | POA: Diagnosis not present

## 2018-06-19 DIAGNOSIS — E119 Type 2 diabetes mellitus without complications: Secondary | ICD-10-CM | POA: Diagnosis not present

## 2018-06-19 DIAGNOSIS — H409 Unspecified glaucoma: Secondary | ICD-10-CM | POA: Diagnosis not present

## 2018-06-19 DIAGNOSIS — J45998 Other asthma: Secondary | ICD-10-CM | POA: Diagnosis not present

## 2018-06-19 DIAGNOSIS — M6281 Muscle weakness (generalized): Secondary | ICD-10-CM | POA: Diagnosis not present

## 2018-06-19 DIAGNOSIS — I639 Cerebral infarction, unspecified: Secondary | ICD-10-CM | POA: Diagnosis not present

## 2018-06-19 DIAGNOSIS — K219 Gastro-esophageal reflux disease without esophagitis: Secondary | ICD-10-CM | POA: Diagnosis not present

## 2018-06-19 DIAGNOSIS — R1310 Dysphagia, unspecified: Secondary | ICD-10-CM | POA: Diagnosis not present

## 2018-06-19 DIAGNOSIS — R5381 Other malaise: Secondary | ICD-10-CM | POA: Diagnosis not present

## 2018-06-19 DIAGNOSIS — H25813 Combined forms of age-related cataract, bilateral: Secondary | ICD-10-CM | POA: Diagnosis not present

## 2018-06-19 DIAGNOSIS — Z7401 Bed confinement status: Secondary | ICD-10-CM | POA: Diagnosis not present

## 2018-06-19 DIAGNOSIS — I10 Essential (primary) hypertension: Secondary | ICD-10-CM | POA: Diagnosis not present

## 2018-06-19 DIAGNOSIS — I63341 Cerebral infarction due to thrombosis of right cerebellar artery: Secondary | ICD-10-CM | POA: Diagnosis not present

## 2018-06-19 DIAGNOSIS — F411 Generalized anxiety disorder: Secondary | ICD-10-CM | POA: Diagnosis not present

## 2018-06-19 DIAGNOSIS — J439 Emphysema, unspecified: Secondary | ICD-10-CM | POA: Diagnosis not present

## 2018-06-19 DIAGNOSIS — Z85118 Personal history of other malignant neoplasm of bronchus and lung: Secondary | ICD-10-CM | POA: Diagnosis not present

## 2018-06-19 DIAGNOSIS — I2510 Atherosclerotic heart disease of native coronary artery without angina pectoris: Secondary | ICD-10-CM | POA: Diagnosis not present

## 2018-06-19 DIAGNOSIS — M5441 Lumbago with sciatica, right side: Secondary | ICD-10-CM | POA: Diagnosis not present

## 2018-06-19 DIAGNOSIS — E1140 Type 2 diabetes mellitus with diabetic neuropathy, unspecified: Secondary | ICD-10-CM | POA: Diagnosis not present

## 2018-06-19 DIAGNOSIS — I69359 Hemiplegia and hemiparesis following cerebral infarction affecting unspecified side: Secondary | ICD-10-CM | POA: Diagnosis not present

## 2018-06-19 DIAGNOSIS — J449 Chronic obstructive pulmonary disease, unspecified: Secondary | ICD-10-CM | POA: Diagnosis not present

## 2018-06-19 DIAGNOSIS — E669 Obesity, unspecified: Secondary | ICD-10-CM | POA: Diagnosis not present

## 2018-06-19 LAB — ECHOCARDIOGRAM COMPLETE
Height: 59 in
Weight: 2880 oz

## 2018-06-19 LAB — GLUCOSE, CAPILLARY
Glucose-Capillary: 115 mg/dL — ABNORMAL HIGH (ref 70–99)
Glucose-Capillary: 134 mg/dL — ABNORMAL HIGH (ref 70–99)

## 2018-06-19 MED ORDER — GABAPENTIN 300 MG PO CAPS: 300.0000 mg | ORAL_CAPSULE | Freq: Three times a day (TID) | ORAL | Status: DC

## 2018-06-19 MED ORDER — CLOPIDOGREL BISULFATE 75 MG PO TABS: 75.0000 mg | ORAL_TABLET | Freq: Every day | ORAL | 3 refills | Status: DC

## 2018-06-19 MED ORDER — OXYCODONE-ACETAMINOPHEN 10-325 MG PO TABS: 1.0000 | ORAL_TABLET | Freq: Four times a day (QID) | ORAL | 0 refills | Status: DC | PRN

## 2018-06-19 MED ORDER — CLONAZEPAM 0.5 MG PO TABS: 0.5000 mg | ORAL_TABLET | Freq: Two times a day (BID) | ORAL | 0 refills | Status: DC

## 2018-06-19 MED ORDER — GUAIFENESIN ER 600 MG PO TB12: 600.0000 mg | ORAL_TABLET | Freq: Two times a day (BID) | ORAL | Status: DC

## 2018-06-19 MED ORDER — ASPIRIN 81 MG PO CHEW: 81.0000 mg | CHEWABLE_TABLET | Freq: Every day | ORAL | 0 refills | Status: DC

## 2018-06-19 NOTE — Plan of Care (Signed)
Called report to [place] at [facility] - pt is going to Rm 33B and will transport via EMS.  Butch Penny, AD, went over d/c paperwork and removed IVs.  Pt is A&O.  Moving the R arm and leg better.  1 assist to [facility] using rolling walker.  She also states that her R eyesight has improved.  EMS has been called for transport.

## 2018-06-19 NOTE — Care Management Important Message (Signed)
Important Message  Patient Details  Name: Yvette Riley MRN: [id number] Date of Birth: 15-Sep-1955   Medicare Important Message Given:  Yes    [name] 06/19/2018, 10:45 AM

## 2018-06-19 NOTE — Discharge Summary (Signed)
[facility] NAME: Yvette Riley    MR#:  [id number]  [place] OF BIRTH:  [birth year]  DATE OF ADMISSION:  06/17/2018   ADMITTING PHYSICIAN: Harrie Foreman, MD  DATE OF DISCHARGE:  06/19/18  PRIMARY CARE PHYSICIAN: Birdie Sons, MD   ADMISSION DIAGNOSIS:   Cerebrovascular accident (CVA), unspecified mechanism ([place]) [I63.9]  DISCHARGE DIAGNOSIS:   Active Problems:   CVA (cerebral vascular accident) ([place])   SECONDARY DIAGNOSIS:   Past Medical History:  Diagnosis Date  . Allergy   . Anemia   . Anxiety   . Arthritis   . Asthma   . Blood transfusion without reported diagnosis   . C. difficile diarrhea 04/07/2016  . CAP (community acquired pneumonia) 03/21/2015  . Combined forms of age-related cataract of both eyes 05/06/2016  . COPD (chronic obstructive pulmonary disease) ([place])   . CVA (cerebral vascular accident) ([place]) 06/17/2018  . Diabetes mellitus without complication ([place])   . Displacement of lumbar intervertebral disc without myelopathy   . Emphysema of lung ([place])   . GERD (gastroesophageal reflux disease)   . Glaucoma   . History of chicken pox   . Hypertension   . Pneumonia 03/29/2015  . Shortness of breath   . Small cell lung cancer [facility])     HOSPITAL COURSE:    63 year old female with past medical history significant for severe arthritis, anemia, history of small cell lung cancer in remission, COPD and emphysema and diabetes presents from home secondary to worsening gait and blurred vision.  1.  Acute CVA-significant right-sided weakness with right arm drift noted on exam.  MRI of the brain showing right cerebellar infarct. -Appreciate neuro input.  Carotid Dopplers with no hemodynamically significant stenosis.  MRI of the brain showing occlusion of the distal right superior cerebral artery compatible with acute right cerebellar infarct. -Neurology recommended aspirin and Plavix for 1 month and then only  Plavix after that. - patient allergic to statins.  LDL of 75.  Continue Zetia -Appreciate PT and OT consults.  They have recommended acute inpatient rehab however patient has refused to go to acute inpatient rehab and wants to go to short-term rehab due to location issues. -Echocardiogram with normal EF of 50 to 19%, diastolic dysfunction.  No interatrial shunt discovered  2.  Diabetes mellitus-A1c of 6.3.  Metformin   3.  Neuropathy-continue gabapentin  And chronic pain medications with percocet  4.  Anxiety-on Klonopin  5. Chronic cough-chest x-ray with chronic bronchitis changes and right-sided radiation fibrosis changes. -discontinued IV fluids - continue her inhalers.  Follows with oncology every 3 months.  No new changes noted on chest x-ray.  Discharge to rehab today.   DISCHARGE CONDITIONS:   Guarded  CONSULTS OBTAINED:   Treatment Team:  Catarina Hartshorn, MD  DRUG ALLERGIES:   Allergies  Allergen Reactions  . Codeine Anaphylaxis  . Crestor [Rosuvastatin] Anaphylaxis  . Other Anaphylaxis  . Penicillins Anaphylaxis, Rash and Other (See Comments)    Reaction:  Tongue swelling  Has patient had a PCN reaction causing immediate rash, facial/tongue/throat swelling, SOB or lightheadedness with hypotension:  Yes   Has patient had a PCN reaction causing severe rash involving mucus membranes or skin necrosis: No Has patient had a PCN reaction that required hospitalization No Has patient had a PCN reaction occurring within the last 10 years: No If all of the above answers are "NO", then may proceed with Cephalosporin use.  . Yellow  Jacket Venom [Bee Venom] Anaphylaxis  . Gemfibrozil Rash, Nausea And Vomiting and Swelling  . Statins Nausea And Vomiting and Swelling  . Trazodone And Nefazodone Nausea And Vomiting  . Lipitor [Atorvastatin] Rash   DISCHARGE MEDICATIONS:   Allergies as of 06/19/2018      Reactions   Codeine Anaphylaxis   Crestor [rosuvastatin]  Anaphylaxis   Other Anaphylaxis   Penicillins Anaphylaxis, Rash, Other (See Comments)   Reaction:  Tongue swelling  Has patient had a PCN reaction causing immediate rash, facial/tongue/throat swelling, SOB or lightheadedness with hypotension:  Yes   Has patient had a PCN reaction causing severe rash involving mucus membranes or skin necrosis: No Has patient had a PCN reaction that required hospitalization No Has patient had a PCN reaction occurring within the last 10 years: No If all of the above answers are "NO", then may proceed with Cephalosporin use.   Yellow Jacket Venom [bee Venom] Anaphylaxis   Gemfibrozil Rash, Nausea And Vomiting, Swelling   Statins Nausea And Vomiting, Swelling   Trazodone And Nefazodone Nausea And Vomiting   Lipitor [atorvastatin] Rash      Medication List    STOP taking these medications   Glucerna Advance Shake Liqd     TAKE these medications   albuterol (2.5 MG/3ML) 0.083% nebulizer solution Commonly known as:  PROVENTIL Take 3 mLs (2.5 mg total) by nebulization every 6 (six) hours as needed for wheezing or shortness of breath.   ProAir HFA 108 (90 Base) MCG/ACT inhaler Generic drug:  albuterol INHALE 2 PUFFS BY MOUTH EVERY 6 HOURS AS NEEDED FOR WHEEZING OR SHORTNESS OF BREATH   aspirin 81 MG chewable tablet Chew 1 tablet (81 mg total) by mouth daily. Start taking on:  Jun 20, 2018   benzonatate 100 MG capsule Commonly known as:  Best boy Take 1 capsule (100 mg total) by mouth 3 (three) times daily as needed for cough.   clonazePAM 0.5 MG tablet Commonly known as:  KLONOPIN Take 1 tablet (0.5 mg total) by mouth 2 (two) times daily for 15 days. What changed:    when to take this  reasons to take this   clopidogrel 75 MG tablet Commonly known as:  PLAVIX Take 1 tablet (75 mg total) by mouth daily. Start taking on:  Jun 20, 2018   ezetimibe 10 MG tablet Commonly known as:  Zetia Take 1 tablet (10 mg total) by mouth daily.    gabapentin 300 MG capsule Commonly known as:  NEURONTIN Take 1 capsule (300 mg total) by mouth 3 (three) times daily. What changed:  when to take this   glucose blood test strip Contour test strip. Use to check blood sugar once a day for for type 2 diabetes (E11.9)   guaiFENesin 600 MG 12 hr tablet Commonly known as:  MUCINEX Take 1 tablet (600 mg total) by mouth 2 (two) times daily.   Lancets Misc Use to check blood sugar once daily for type 2 diabetes E11.9   meloxicam 15 MG tablet Commonly known as:  MOBIC TAKE 1 TABLET BY MOUTH ONCE DAILY AS NEEDED FOR PAIN What changed:  See the new instructions.   metFORMIN 500 MG 24 hr tablet Commonly known as:  Glucophage XR Take 2 tablets (1,000 mg total) by mouth daily with breakfast. With meals What changed:    how much to take  when to take this   montelukast 10 MG tablet Commonly known as:  Singulair Take 1 tablet (10 mg total) by mouth  at bedtime.   Narcan 4 MG/0.1ML Liqd nasal spray kit Generic drug:  naloxone Place 0.4 mg into the nose once.   oxyCODONE-acetaminophen 10-325 MG tablet Commonly known as:  PERCOCET Take 1 tablet by mouth every 6 (six) hours as needed for pain (moderate and severe). One tablet every 4-6 hours as needed. What changed:    how much to take  how to take this  when to take this  reasons to take this   ramelteon 8 MG tablet Commonly known as:  Rozerem Take 1 tablet (8 mg total) by mouth at bedtime.   umeclidinium-vilanterol 62.5-25 MCG/INH Aepb Commonly known as:  ANORO ELLIPTA Inhale 1 puff into the lungs daily.   zaleplon 10 MG capsule Commonly known as:  SONATA TAKE 1 CAPSULE BY MOUTH IN THE EVENING. MAY TAKE SECOND CAPSULE AFTER 2 HOURS IF NEEDED        DISCHARGE INSTRUCTIONS:   1. PCP f/u in 1-2 weeks  DIET:   Cardiac diet  ACTIVITY:   Activity as tolerated  OXYGEN:   Home Oxygen: No.  Oxygen Delivery: room air  DISCHARGE LOCATION:   nursing home   If  you experience worsening of your admission symptoms, develop shortness of breath, life threatening emergency, suicidal or homicidal thoughts you must seek medical attention immediately by calling 911 or calling your MD immediately  if symptoms less severe.  You Must read complete instructions/literature along with all the possible adverse reactions/side effects for all the Medicines you take and that have been prescribed to you. Take any new Medicines after you have completely understood and accpet all the possible adverse reactions/side effects.   Please note  You were cared for by a hospitalist during your hospital stay. If you have any questions about your discharge medications or the care you received while you were in the hospital after you are discharged, you can call the unit and asked to speak with the hospitalist on call if the hospitalist that took care of you is not available. Once you are discharged, your primary care physician will handle any further medical issues. Please note that NO REFILLS for any discharge medications will be authorized once you are discharged, as it is imperative that you return to your primary care physician (or establish a relationship with a primary care physician if you do not have one) for your aftercare needs so that they can reassess your need for medications and monitor your lab values.    On the day of Discharge:  VITAL SIGNS:   Blood pressure 123/77, pulse 91, temperature 97.8 F (36.6 C), temperature source Oral, resp. rate 16, height '4\' 11"'$  (1.499 m), weight 81.6 kg, SpO2 97 %.  PHYSICAL EXAMINATION:    GENERAL:  63 y.o.-year-old patient lying in the bed with no acute distress.  EYES: Pupils equal, round, reactive to light and accommodation. No scleral icterus. Extraocular muscles intact.  HEENT: Head atraumatic, normocephalic. Oropharynx and nasopharynx clear.  NECK:  Supple, no jugular venous distention. No thyroid enlargement, no tenderness.   LUNGS: Normal breath sounds bilaterally, no wheezing, rales,rhonchi or crepitation. No use of accessory muscles of respiration.  Decreased bibasilar breath sounds CARDIOVASCULAR: S1, S2 normal. No  rubs, or gallops.  2/6 systolic murmur is present ABDOMEN: Soft, nontender, nondistended. Bowel sounds present. No organomegaly or mass.  EXTREMITIES: No pedal edema, cyanosis, or clubbing.  NEUROLOGIC: Cranial nerves II through XII are intact.  Speech is clear, does not have her dentures now.  Muscle strength is  5/5 in left upper and lower extremities.  Right lower extremity is 4/5 and right upper extremity is 5/5 with significant drift on extension.. Sensation intact. Gait not checked.  PSYCHIATRIC: The patient is alert and oriented x 3.  SKIN: No obvious rash, lesion, or ulcer.   DATA REVIEW:   CBC Recent Labs  Lab 06/17/18 0019  WBC 9.5  HGB 13.0  HCT 40.4  PLT 295    Chemistries  Recent Labs  Lab 06/17/18 0019 06/18/18 0308  NA 139 140  K 3.6 3.7  CL 107 110  CO2 19* 22  GLUCOSE 161* 135*  BUN 12 15  CREATININE 0.67 0.67  CALCIUM 9.2 8.2*  AST 21  --   ALT 15  --   ALKPHOS 77  --   BILITOT 0.7  --      Microbiology Results  Results for orders placed or performed during the hospital encounter of 06/17/18  SARS Coronavirus 2 (CEPHEID - Performed in [place] hospital lab), Hosp Order     Status: None   Collection Time: 06/17/18 12:19 AM  Result Value Ref Range Status   SARS Coronavirus 2 NEGATIVE NEGATIVE Final    Comment: (NOTE) If result is NEGATIVE SARS-CoV-2 target nucleic acids are NOT DETECTED. The SARS-CoV-2 RNA is generally detectable in upper and lower  respiratory specimens during the acute phase of infection. The lowest  concentration of SARS-CoV-2 viral copies this assay can detect is 250  copies / mL. A negative result does not preclude SARS-CoV-2 infection  and should not be used as the sole basis for treatment or other  patient management  decisions.  A negative result may occur with  improper specimen collection / handling, submission of specimen other  than nasopharyngeal swab, presence of viral mutation(s) within the  areas targeted by this assay, and inadequate number of viral copies  (<250 copies / mL). A negative result must be combined with clinical  observations, patient history, and epidemiological information. If result is POSITIVE SARS-CoV-2 target nucleic acids are DETECTED. The SARS-CoV-2 RNA is generally detectable in upper and lower  respiratory specimens dur ing the acute phase of infection.  Positive  results are indicative of active infection with SARS-CoV-2.  Clinical  correlation with patient history and other diagnostic information is  necessary to determine patient infection status.  Positive results do  not rule out bacterial infection or co-infection with other viruses. If result is PRESUMPTIVE POSTIVE SARS-CoV-2 nucleic acids MAY BE PRESENT.   A presumptive positive result was obtained on the submitted specimen  and confirmed on repeat testing.  While 2019 novel coronavirus  (SARS-CoV-2) nucleic acids may be present in the submitted sample  additional confirmatory testing may be necessary for epidemiological  and / or clinical management purposes  to differentiate between  SARS-CoV-2 and other Sarbecovirus currently known to infect humans.  If clinically indicated additional testing with an alternate test  methodology [phone]) is advised. The SARS-CoV-2 RNA is generally  detectable in upper and lower respiratory sp ecimens during the acute  phase of infection. The expected result is Negative. Fact Sheet for Patients:  StrictlyIdeas.no Fact Sheet for Healthcare Providers: BankingDealers.co.za This test is not yet approved or cleared by the [place] FDA and has been authorized for detection and/or diagnosis of SARS-CoV-2 by FDA under an  Emergency Use Authorization (EUA).  This EUA will remain in effect (meaning this test can be used) for the duration of the COVID-19 declaration under Section 564(b)(1) of the  Act, 21 U.S.C. section 360bbb-3(b)(1), unless the authorization is terminated or revoked sooner. Performed at [facility], [address] [place], [place] [address]     RADIOLOGY:  No results found.   Management plans discussed with the patient, family and they are in agreement.  CODE STATUS:     Code Status Orders  (From admission, onward)         Start     Ordered   06/17/18 0453  Full code  Continuous     06/17/18 0452        Code Status History    Date Active Date Inactive Code Status Order ID Comments User Context   03/17/2016 2001 03/20/2016 1733 Full Code [id number]  Epifanio Lesches, MD ED   03/29/2015 1939 03/31/2015 1739 Full Code [id number]  Fritzi Mandes, MD Inpatient   03/21/2015 2356 03/23/2015 1646 Full Code [id number]  Lance Coon, MD Inpatient   06/12/2014 0807 06/12/2014 1904 Full Code [id number]  Juluis Mire, MD Inpatient      TOTAL TIME TAKING CARE OF THIS PATIENT: 38  minutes.    Gladstone Lighter M.D on 06/19/2018 at 11:08 AM  Between 7am to 6pm - Pager - [phone]  After 6pm go to [email or web address] - Proofreader  [facility]  Office  [phone]  CC: Primary care physician; Birdie Sons, MD   Note: This dictation was prepared with Dragon dictation along with smaller phrase technology. Any transcriptional errors that result from this process are unintentional.

## 2018-06-19 NOTE — Progress Notes (Signed)
Physical Therapy Treatment Patient Details Name: Yvette Riley MRN: [id number] DOB: [birth year] Today's Date: 06/19/2018    History of Present Illness Patient is a pleasant 63 year old female who presented to Ed for sudden vomiting and blurred vision with noticeable change in balance, imaging revelaed a subacute lesion in R superior cerebellar artery. Patient reports she has been unsteady for a few days with last "normal day" being 5/16 however has been unsteady with her balance for the past 3 years. Reports 5-6 falls in the past two months. PMh includes lung cancer (chemo and radiation), DM, HTN, COPD, anemia, anxiety, CVA, and glaucoma.    PT Comments    Patient initially agreeable to PT session due to having to use restroom. After performing SPT with C[facility] to [facility] and then back to bed patient performed seated strengthening interventions. Patient reported headache and that she wanted to go back to bed. Session terminated due to patient not wanting to continue with further interventions. Current POC remains appropriate.     Follow Up Recommendations  CIR     Equipment Recommendations       Recommendations for Other Services       Precautions / Restrictions Precautions Precautions: Fall Restrictions Weight Bearing Restrictions: No    Mobility  Bed Mobility Overal bed mobility: Needs Assistance Bed Mobility: Sit to Supine     Supine to sit: Supervision Sit to supine: Supervision   General bed mobility comments: Patient requires extra time and slow scooting of R foot towards edge of bed,unable to lift and bring to side of bed.   Transfers Overall transfer level: Needs assistance Equipment used: Rolling walker (2 wheeled) Transfers: Sit to/from Stand Sit to Stand: Min assist;Min guard         General transfer comment: cues for safety  Ambulation/Gait                 Stairs             Wheelchair Mobility    Modified Rankin (Stroke Patients Only)        Balance Overall balance assessment: Needs assistance;History of Falls Sitting-balance support: Bilateral upper extremity supported;Feet supported Sitting balance-Leahy Scale: Fair   Postural control: Posterior lean Standing balance support: Bilateral upper extremity supported Standing balance-Leahy Scale: Poor Standing balance comment: unsteady, slight LOB posteriorly and pt required Min A and cues to correct                            Cognition Arousal/Alertness: Awake/alert Behavior During Therapy: WFL for tasks assessed/performed Overall Cognitive Status: Within Functional Limits for tasks assessed                                 General Comments: grossly WFL, requires cues for safety during mobility, slightly decreased safety awareness      Exercises General Exercises - Lower Extremity Ankle Circles/Pumps: AAROM;Strengthening;Both;15 reps Long Arc Quad: Strengthening;Both;15 reps;Supine Hip Flexion/Marching: Strengthening;Both;15 reps;Seated Other Exercises Other Exercises: education on safe bed mobility and transfers for fall reduction Other Exercises: EOB stability and LE movement    General Comments        Pertinent Vitals/Pain Pain Score: 7  Pain Location: behind R eye and headache (R posterior) Pain Descriptors / Indicators: Aching;Discomfort Pain Intervention(s): Limited activity within patient's tolerance;Monitored during session;Repositioned    Home Living  Prior Function            PT Goals (current goals can now be found in the care plan section) Acute Rehab PT Goals Patient Stated Goal: return to PLOF Time For Goal Achievement: 07/01/18 Potential to Achieve Goals: Fair Progress towards PT goals: Progressing toward goals    Frequency    7X/week      PT Plan Current plan remains appropriate    Co-evaluation              AM-PAC PT "6 Clicks" Mobility   Outcome Measure   Help needed turning from your back to your side while in a flat bed without using bedrails?: A Little Help needed moving from lying on your back to sitting on the side of a flat bed without using bedrails?: A Lot Help needed moving to and from a bed to a chair (including a wheelchair)?: A Lot Help needed standing up from a chair using your arms (e.g., wheelchair or bedside chair)?: A Little Help needed to walk in hospital room?: Total Help needed climbing 3-5 steps with a railing? : Total 6 Click Score: 12    End of Session Equipment Utilized During Treatment: Gait belt Activity Tolerance: Patient limited by fatigue;Patient limited by pain(headache) Patient left: with call bell/phone within reach;in bed;with bed alarm set;with SCD's reapplied Nurse Communication: Mobility status PT Visit Diagnosis: Unsteadiness on feet (R26.81);Other abnormalities of gait and mobility (R26.89);Repeated falls (R29.6);Muscle weakness (generalized) (M62.81);History of falling (Z91.81);Ataxic gait (R26.0);Difficulty in walking, not elsewhere classified (R26.2);Other symptoms and signs involving the nervous system (R29.898)     Time: 7017-7939 PT Time Calculation (min) (ACUTE ONLY): 14 min  Charges:  $Therapeutic Exercise: 8-22 mins                     Janna Arch, PT, DPT     Janna Arch 06/19/2018, 11:34 AM

## 2018-06-19 NOTE — TOC Transition Note (Signed)
Transition of Care [facility]) - CM/SW Discharge Note   Patient Details  Name: LAKETTA SODERBERG MRN: [id number] Date of Birth: 12/16/1955  Transition of Care [facility]) CM/SW Contact:  Annamaria Boots, [place] Phone Number: 06/19/2018, 11:18 AM   Clinical Narrative: Patient is medically ready for discharge today. Patient will be discharged to [facility] today. CSW notified patient of discharge today. CSW also notified Claiborne Billings at [facility] of discharge today. Patient will be transported by EMS. RN to call report and call for transport.        Final next level of care: [place] Barriers to Discharge: No Barriers Identified   Patient Goals and CMS Choice Patient states their goals for this hospitalization and ongoing recovery are:: I dont want to go to [place]. I want to stay in town  CMS Medicare.gov Compare Post Acute Care list provided to:: Patient Choice offered to / list presented to : Patient  Discharge Placement PASRR number recieved: 06/18/18            Patient chooses bed at: [facility] Patient to be transferred to facility by: EMS   Patient and family notified of of transfer: 06/19/18  Discharge Plan and Services     Post Acute Care Choice: [place]                               Social Determinants of Health (SDOH) Interventions     Readmission Risk Interventions No flowsheet data found.

## 2018-06-23 ENCOUNTER — Encounter: Payer: Self-pay | Admitting: Family Medicine

## 2018-06-23 ENCOUNTER — Other Ambulatory Visit: Payer: Self-pay | Admitting: Family Medicine

## 2018-06-23 DIAGNOSIS — R5381 Other malaise: Secondary | ICD-10-CM | POA: Diagnosis not present

## 2018-06-23 DIAGNOSIS — G8929 Other chronic pain: Secondary | ICD-10-CM

## 2018-06-23 DIAGNOSIS — M5442 Lumbago with sciatica, left side: Secondary | ICD-10-CM

## 2018-06-23 DIAGNOSIS — I69359 Hemiplegia and hemiparesis following cerebral infarction affecting unspecified side: Secondary | ICD-10-CM | POA: Diagnosis not present

## 2018-06-23 DIAGNOSIS — C3491 Malignant neoplasm of unspecified part of right bronchus or lung: Secondary | ICD-10-CM | POA: Diagnosis not present

## 2018-06-23 DIAGNOSIS — I2510 Atherosclerotic heart disease of native coronary artery without angina pectoris: Secondary | ICD-10-CM | POA: Diagnosis not present

## 2018-06-23 NOTE — Telephone Encounter (Signed)
Pt needs refill on   Oxycodone 10-325  Gabapentin 300 mg  Zaleplon 10 mg  [place]  Thanks [place]

## 2018-06-24 MED ORDER — GABAPENTIN 300 MG PO CAPS: 300.0000 mg | ORAL_CAPSULE | Freq: Three times a day (TID) | ORAL | 5 refills | Status: DC

## 2018-06-24 MED ORDER — ZALEPLON 10 MG PO CAPS: ORAL_CAPSULE | ORAL | 3 refills | Status: DC

## 2018-06-24 NOTE — Telephone Encounter (Signed)
30 days dispensed 05/29/2018  5 days written 06/19/2018

## 2018-06-25 ENCOUNTER — Telehealth: Payer: Self-pay

## 2018-06-25 NOTE — Telephone Encounter (Signed)
Order already pending for this request.

## 2018-06-25 NOTE — Telephone Encounter (Signed)
Patient calling for medication refill on the following medication oxyCODONE-acetaminophen (PERCOCET) 10-325 MG tablet  Reports that she is due June 1.  Pharmacy: [place]

## 2018-06-26 ENCOUNTER — Other Ambulatory Visit: Payer: Self-pay | Admitting: *Deleted

## 2018-06-26 ENCOUNTER — Telehealth: Payer: Self-pay | Admitting: Family Medicine

## 2018-06-26 DIAGNOSIS — Z6836 Body mass index (BMI) 36.0-36.9, adult: Secondary | ICD-10-CM | POA: Diagnosis not present

## 2018-06-26 DIAGNOSIS — Z7984 Long term (current) use of oral hypoglycemic drugs: Secondary | ICD-10-CM | POA: Diagnosis not present

## 2018-06-26 DIAGNOSIS — Z9181 History of falling: Secondary | ICD-10-CM | POA: Diagnosis not present

## 2018-06-26 DIAGNOSIS — M5442 Lumbago with sciatica, left side: Secondary | ICD-10-CM

## 2018-06-26 DIAGNOSIS — Z85118 Personal history of other malignant neoplasm of bronchus and lung: Secondary | ICD-10-CM | POA: Diagnosis not present

## 2018-06-26 DIAGNOSIS — I10 Essential (primary) hypertension: Secondary | ICD-10-CM | POA: Diagnosis not present

## 2018-06-26 DIAGNOSIS — I69393 Ataxia following cerebral infarction: Secondary | ICD-10-CM | POA: Diagnosis not present

## 2018-06-26 DIAGNOSIS — J439 Emphysema, unspecified: Secondary | ICD-10-CM | POA: Diagnosis not present

## 2018-06-26 DIAGNOSIS — E1140 Type 2 diabetes mellitus with diabetic neuropathy, unspecified: Secondary | ICD-10-CM | POA: Diagnosis not present

## 2018-06-26 DIAGNOSIS — Z87891 Personal history of nicotine dependence: Secondary | ICD-10-CM | POA: Diagnosis not present

## 2018-06-26 DIAGNOSIS — H409 Unspecified glaucoma: Secondary | ICD-10-CM | POA: Diagnosis not present

## 2018-06-26 DIAGNOSIS — R1310 Dysphagia, unspecified: Secondary | ICD-10-CM | POA: Diagnosis not present

## 2018-06-26 DIAGNOSIS — G8929 Other chronic pain: Secondary | ICD-10-CM

## 2018-06-26 DIAGNOSIS — K219 Gastro-esophageal reflux disease without esophagitis: Secondary | ICD-10-CM | POA: Diagnosis not present

## 2018-06-26 DIAGNOSIS — M5441 Lumbago with sciatica, right side: Secondary | ICD-10-CM | POA: Diagnosis not present

## 2018-06-26 DIAGNOSIS — F411 Generalized anxiety disorder: Secondary | ICD-10-CM | POA: Diagnosis not present

## 2018-06-26 DIAGNOSIS — M1990 Unspecified osteoarthritis, unspecified site: Secondary | ICD-10-CM | POA: Diagnosis not present

## 2018-06-26 DIAGNOSIS — E669 Obesity, unspecified: Secondary | ICD-10-CM | POA: Diagnosis not present

## 2018-06-26 DIAGNOSIS — J45998 Other asthma: Secondary | ICD-10-CM | POA: Diagnosis not present

## 2018-06-26 NOTE — Patient Outreach (Signed)
Verified in [place] that member discharged from [facility]F on 06/24/18.   Noted member goes to [facility] for primary care. This practice has a [facility] embedded care management team.   Sent notification of member's discharge from SNF to [facility] embedded care management team at [facility].   Yvette Riley medical history includes recent CVA, COPD, DM, small cell lung CA.    Yvette Rolling, MSN-Ed, RN,BSN [place] Acute Care Coordinator [phone]

## 2018-06-26 NOTE — Telephone Encounter (Signed)
Called pt back regarding her needing to make an appt with Dr. Caryn Section.  There are not available times to schedule a Hospital f/u for pt.  She had a stroke on Mon 25 or Tues 26 of May.  Please call her back with  time she could be worked in.   She also stated her oxyCODONE-acetaminophen (PERCOCET) 10-325 MG tablet would not be given back to her after she left the [facility]. She is stating she wants a refill or the ones taken to be given back to her.  Please advise.  Thanks, [employer]

## 2018-06-29 ENCOUNTER — Telehealth: Payer: Self-pay | Admitting: *Deleted

## 2018-06-29 MED ORDER — OXYCODONE-ACETAMINOPHEN 10-325 MG PO TABS: 1.0000 | ORAL_TABLET | Freq: Four times a day (QID) | ORAL | 0 refills | Status: DC | PRN

## 2018-06-29 NOTE — Telephone Encounter (Signed)
That's fine

## 2018-06-29 NOTE — Telephone Encounter (Signed)
LMTCB 06/29/2018  Thanks,   -Mickel Baas

## 2018-06-29 NOTE — Telephone Encounter (Signed)
Patient called back concerning message below. Patient states she only has a few oxycodone tablets left. Please advise?

## 2018-06-29 NOTE — Telephone Encounter (Signed)
Left message advising Estill Bamberg.   Thanks,   -Mickel Baas

## 2018-06-29 NOTE — Telephone Encounter (Signed)
She can have the 11:20 Wednesday, or the 3:40 Friday for hospital follw. I sent in refill for oxycodone/apap

## 2018-06-29 NOTE — Telephone Encounter (Signed)
Estill Bamberg from skilled nursing called for verbal orders. Needs verbal orders for nursing services for stroke management 2 days a week x4 weeks. UY#[phone]. Please advise?

## 2018-06-30 ENCOUNTER — Encounter: Payer: Self-pay | Admitting: Family Medicine

## 2018-06-30 ENCOUNTER — Ambulatory Visit: Payer: Self-pay

## 2018-06-30 ENCOUNTER — Other Ambulatory Visit: Payer: Self-pay

## 2018-06-30 ENCOUNTER — Ambulatory Visit (INDEPENDENT_AMBULATORY_CARE_PROVIDER_SITE_OTHER): Payer: Medicare Other | Admitting: Family Medicine

## 2018-06-30 VITALS — BP 106/73 | HR 112 | Temp 97.9°F | Wt 181.0 lb

## 2018-06-30 DIAGNOSIS — I10 Essential (primary) hypertension: Secondary | ICD-10-CM

## 2018-06-30 DIAGNOSIS — I639 Cerebral infarction, unspecified: Secondary | ICD-10-CM

## 2018-06-30 DIAGNOSIS — J40 Bronchitis, not specified as acute or chronic: Secondary | ICD-10-CM | POA: Diagnosis not present

## 2018-06-30 DIAGNOSIS — J449 Chronic obstructive pulmonary disease, unspecified: Secondary | ICD-10-CM

## 2018-06-30 DIAGNOSIS — Z8673 Personal history of transient ischemic attack (TIA), and cerebral infarction without residual deficits: Secondary | ICD-10-CM

## 2018-06-30 DIAGNOSIS — G8929 Other chronic pain: Secondary | ICD-10-CM | POA: Diagnosis not present

## 2018-06-30 DIAGNOSIS — M5441 Lumbago with sciatica, right side: Secondary | ICD-10-CM | POA: Diagnosis not present

## 2018-06-30 DIAGNOSIS — M5442 Lumbago with sciatica, left side: Secondary | ICD-10-CM

## 2018-06-30 MED ORDER — OXYCODONE-ACETAMINOPHEN 10-325 MG PO TABS: ORAL_TABLET | ORAL | 0 refills | Status: DC

## 2018-06-30 MED ORDER — CIPROFLOXACIN HCL 500 MG PO TABS: 500.0000 mg | ORAL_TABLET | Freq: Two times a day (BID) | ORAL | 0 refills | Status: AC

## 2018-06-30 NOTE — Patient Instructions (Signed)
.   Please review the attached list of medications and notify my office if there are any errors.   . Please bring all of your medications to every appointment so we can make sure that our medication list is the same as yours.   

## 2018-06-30 NOTE — Telephone Encounter (Signed)
Pt has apt today 06/30/2018 at 11AM  Thanks,   -Mickel Baas

## 2018-06-30 NOTE — Chronic Care Management (AMB) (Signed)
  Chronic Care Management   Note  06/30/2018 Name: Yvette Riley MRN: [id number] DOB: 06-06-1955  [name] is a 63 year old female who sees Dr. Lelon Huh for primary care. [name] RN, [place] Acute Care Coordinator asked the CCM team to consult the patient for care coordination and chronic case management post discharge from [facility] 06/24/2018. Patient has a history of but not limited to recent CVA, COPD, DM, and small cell lung CA. Referral was placed 06/26/2018. Telephone outreach to patient today to introduce CCM services.  Patient states she is currently at Dr. Sabino Snipes office for post SNF follow up. She wishes to speak with Dr. Caryn Section about CCM services prior to consenting. CCM RN CM agreed to call patient back at a later time.   Ms. Gauntt was given information about Chronic Care Management services today including:  1. CCM service includes personalized support from designated clinical staff supervised by her physician, including individualized plan of care and coordination with other care providers 2. 24/7 contact phone numbers for assistance for urgent and routine care needs. 3. Service will only be billed when office clinical staff spend 20 minutes or more in a month to coordinate care. 4. Only one practitioner may furnish and bill the service in a calendar month. 5. The patient may stop CCM services at any time (effective at the end of the month) by phone call to the office staff. 6. The patient will be responsible for cost sharing (co-pay) of up to 20% of the service fee (after annual deductible is met).  Patient did not agree to services and wishes to consider information provided before deciding about enrollment in care management services. She is encouraged to speak with Dr. Caryn Section about CCM Services during her appointment today.  Plan: Will follow up with patient within 7 days to assess for consent to services  Llewellyn Schoenberger E. [name], RN, BSN Nurse Care Coordinator  [facility] Care Management [phone]

## 2018-06-30 NOTE — Progress Notes (Signed)
Patient: Yvette Riley Female    DOB: 01-16-1956   63 y.o.   MRN: [id number] Visit Date: 06/30/2018  Today's Provider: Lelon Huh, MD   Chief Complaint  Patient presents with  . Hospitalization Follow-up   Subjective:     HPI    Follow up Hospitalization  Patient was admitted to [facility] on 06/17/2018 and discharged on 06/19/2018. 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  Treatment for this included ASA and Plavix for one month then only Plavix after a month.  She is intolerant to multiple statins, so was discharged on ezetimibe.   Pt also discharged to short term rehab where she stayed for a few days, has now been home for the last week. She still feels weak on right side but is improve.   She reports excellent compliance with treatment. She reports this condition is Improved.  But pt reports she is not back to baseline. She has also had some mildly productive coughing since discharge. No fevers or worsening dyspnea.   ------------------------------------------------------------------------------------     Allergies  Allergen Reactions  . Codeine Anaphylaxis  . Crestor [Rosuvastatin] Anaphylaxis  . Other Anaphylaxis  . Penicillins Anaphylaxis, Rash and Other (See Comments)    Reaction:  Tongue swelling  Has patient had a PCN reaction causing immediate rash, facial/tongue/throat swelling, SOB or lightheadedness with hypotension:  Yes   Has patient had a PCN reaction causing severe rash involving mucus membranes or skin necrosis: No Has patient had a PCN reaction that required hospitalization No Has patient had a PCN reaction occurring within the last 10 years: No If all of the above answers are "NO", then may proceed with Cephalosporin use.  . Yellow Jacket Venom [Bee Venom] Anaphylaxis  . Gemfibrozil Rash, Nausea And Vomiting  and Swelling  . Statins Nausea And Vomiting and Swelling  . Trazodone And Nefazodone Nausea And Vomiting  . Lipitor [Atorvastatin] Rash     Current Outpatient Medications:  .  albuterol (PROVENTIL) (2.5 MG/3ML) 0.083% nebulizer solution, Take 3 mLs (2.5 mg total) by nebulization every 6 (six) hours as needed for wheezing or shortness of breath., Disp: 75 mL, Rfl: 3 .  aspirin 81 MG chewable tablet, Chew 1 tablet (81 mg total) by mouth daily., Disp: 30 tablet, Rfl: 0 .  benzonatate (TESSALON PERLES) 100 MG capsule, Take 1 capsule (100 mg total) by mouth 3 (three) times daily as needed for cough., Disp: 60 capsule, Rfl: 3 .  clonazePAM (KLONOPIN) 0.5 MG tablet, Take 1 tablet (0.5 mg total) by mouth 2 (two) times daily for 15 days., Disp: 30 tablet, Rfl: 0 .  clopidogrel (PLAVIX) 75 MG tablet, Take 1 tablet (75 mg total) by mouth daily., Disp: 30 tablet, Rfl: 3 .  ezetimibe (ZETIA) 10 MG tablet, Take 1 tablet (10 mg total) by mouth daily., Disp: 30 tablet, Rfl: 12 .  gabapentin (NEURONTIN) 300 MG capsule, Take 1 capsule (300 mg total) by mouth 3 (three) times daily., Disp: 90 capsule, Rfl: 5 .  glucose blood test strip, Contour test strip. Use to check blood sugar once a day for for type 2 diabetes (E11.9), Disp: 100 each, Rfl: 4 .  guaiFENesin (MUCINEX) 600 MG 12 hr tablet, Take 1 tablet (600 mg total) by mouth 2 (two) times daily., Disp: , Rfl:  .  Lancets MISC, Use to check blood sugar  once daily for type 2 diabetes E11.9, Disp: 100 each, Rfl: 4 .  meloxicam (MOBIC) 15 MG tablet, TAKE 1 TABLET BY MOUTH ONCE DAILY AS NEEDED FOR PAIN (Patient taking differently: Take 15 mg by mouth daily. ), Disp: 90 tablet, Rfl: 3 .  metFORMIN (GLUCOPHAGE XR) 500 MG 24 hr tablet, Take 2 tablets (1,000 mg total) by mouth daily with breakfast. With meals (Patient taking differently: Take 500 mg by mouth 2 (two) times daily with a meal. With meals), Disp: 60 tablet, Rfl: 3 .  montelukast (SINGULAIR) 10 MG tablet,  Take 1 tablet (10 mg total) by mouth at bedtime., Disp: 90 tablet, Rfl: 3 .  NARCAN 4 MG/0.1ML LIQD nasal spray kit, Place 0.4 mg into the nose once. , Disp: , Rfl: 99 .  oxyCODONE-acetaminophen (PERCOCET) 10-325 MG tablet, Take 1 tablet by mouth every 6 (six) hours as needed for pain (moderate and severe). One tablet every 4-6 hours as needed., Disp: 30 tablet, Rfl: 0 .  PROAIR HFA 108 (90 Base) MCG/ACT inhaler, INHALE 2 PUFFS BY MOUTH EVERY 6 HOURS AS NEEDED FOR WHEEZING OR SHORTNESS OF BREATH, Disp: , Rfl: 3 .  ramelteon (ROZEREM) 8 MG tablet, Take 1 tablet (8 mg total) by mouth at bedtime., Disp: 30 tablet, Rfl: 3 .  umeclidinium-vilanterol (ANORO ELLIPTA) 62.5-25 MCG/INH AEPB, Inhale 1 puff into the lungs daily., Disp: 30 each, Rfl: 12 .  zaleplon (SONATA) 10 MG capsule, TAKE 1 CAPSULE BY MOUTH IN THE EVENING. MAY TAKE SECOND CAPSULE AFTER 2 HOURS IF NEEDED, Disp: 30 capsule, Rfl: 3  Review of Systems  Constitutional: Positive for activity change, appetite change and fatigue. Negative for chills, diaphoresis, fever and unexpected weight change.  Respiratory: Negative.   Cardiovascular: Negative.   Gastrointestinal: Positive for vomiting. Negative for abdominal distention, abdominal pain, anal bleeding, blood in stool, constipation, diarrhea, nausea and rectal pain.  Musculoskeletal: Positive for arthralgias, back pain and gait problem. Negative for joint swelling, myalgias, neck pain and neck stiffness.  Neurological: Positive for tremors, speech difficulty, weakness (Right sided secondary to CVA) and numbness. Negative for dizziness, seizures, syncope, facial asymmetry, light-headedness and headaches.    Social History   Tobacco Use  . Smoking status: Former Smoker    Packs/day: 0.25    Years: 45.00    Pack years: 11.25    Types: Cigarettes    Last attempt to quit: 03/17/2016    Years since quitting: 2.2  . Smokeless tobacco: Never Used  . Tobacco comment: pt states she quit in  2016-2017  Substance Use Topics  . Alcohol use: No    Alcohol/week: 0.0 standard drinks      Objective:   BP 106/73 (BP Location: Left Arm, Patient Position: Sitting, Cuff Size: Large)   Pulse (!) 112   Temp 97.9 F (36.6 C) (Oral)   Wt 181 lb (82.1 kg)   SpO2 95%   BMI 36.56 kg/m  Vitals:   06/30/18 1113  BP: 106/73  Pulse: (!) 112  Temp: 97.9 F (36.6 C)  TempSrc: Oral  SpO2: 95%  Weight: 181 lb (82.1 kg)     Physical Exam   General Appearance:    Alert, cooperative, no distress  Eyes:    PERRL, conjunctiva/corneas clear, EOM's intact       Lungs:     Occasional expiratory wheeze, no rales,   Heart:    Regular rate and rhythm  Neurologic:   Awake, alert, oriented x 3. No apparent focal neurological  defect.           Assessment & Plan    1. Cerebellar infarct (HCC) Improving, but not to baseline. Is scheduled to continue PT at home starting tomorrow. Continue current medications.  Stop ECASA after a month  2. Chronic bilateral low back pain with bilateral sciatica refill - oxyCODONE-acetaminophen (PERCOCET) 10-325 MG tablet; One tablet every 4-6 hours as needed.  Dispense: 180 tablet; Refill: 0  3. Bronchitis  - ciprofloxacin (CIPRO) 500 MG tablet; Take 1 tablet (500 mg total) by mouth 2 (two) times daily for 7 days.  Dispense: 14 tablet; Refill: 0  Future Appointments  Date Time Provider [place]  08/03/2018  8:40 AM Birdie Sons, MD [facility] None       The entirety of the information documented in the History of Present Illness, Review of Systems and Physical Exam were personally obtained by me. Portions of this information were initially documented by Kizzie Furnish, CMA and reviewed by me for thoroughness and accuracy.   Lelon Huh, MD  [facility]

## 2018-07-01 ENCOUNTER — Ambulatory Visit: Payer: Self-pay

## 2018-07-01 ENCOUNTER — Telehealth: Payer: Self-pay

## 2018-07-01 ENCOUNTER — Encounter: Payer: Self-pay | Admitting: Family Medicine

## 2018-07-01 DIAGNOSIS — J449 Chronic obstructive pulmonary disease, unspecified: Secondary | ICD-10-CM

## 2018-07-01 DIAGNOSIS — I10 Essential (primary) hypertension: Secondary | ICD-10-CM

## 2018-07-01 NOTE — Telephone Encounter (Signed)
FYI:::: A anonymous caller called to report that patient is selling pain medication each month. She states that's why patient calls the office so many times per month. Caller would not give me her name.

## 2018-07-01 NOTE — Progress Notes (Deleted)
[facility]     Assessment and Plan:  Small cell lung cancer. --Status post chemo and radiation, currently following with oncology and cancer appears to be controlled.   COPD with acute bronchitis.  --Anoro puff per day. Instructed not to mix with Stiolto or Spiriva.  --Pt was given steroid yesterday and asked to fill it.  --Pt will be moving her care here as per her request.   Nicotine abuse.  --She has stopped smoking and is congratulated. She is urged not to restart.   Chronic hypoxic respiratory failure. - Continue oxygen 2 L at night.  Patient is interested in getting a portable oxygen concentrator during the day.  Instructed that she has to demonstrate oxygen desaturation, therefore will order 6-minute walk test.  Polypharmacy. - Patient has multiple interacting inhalers that she is using at the same time, which include Spiriva, Anoro, Stiolto.  She is encouraged to use only Anoro 1 puff once a day and immediately stop the other inhalers.  She is instructed that these 3 inhalers are not to be used together.  No orders of the defined types were placed in this encounter.  No follow-ups on file.   Date: 07/01/2018  MRN# [id number] Yvette Riley 1955/08/04    Yvette Riley is a 63 y.o. old female seen in consultation for chief complaint of:    No chief complaint on file.   HPI:  The patient is a 63 year old female with small cell lung cancer currently following with Dr. Grayland Ormond felt to have stable disease.  She was recently dc from the hospital after a cerebellar infarct. Today she feels that her breathing has continued to rough. She is on oxygen at night but she would like a portable oxygen. She had a walk test yesterday at [facility] and did not desat.  She ran out of spiriva, she is on anoro every morning. She takes albuterol rarely.  She has not smoked since her cancer diagnosis.     Sat at [facility] 04/04/16; @ RA at rest is 97% and HR 94; Ambulated  slowly with a cane. After 180 feet sat was 92%, HR 110.   Review of tracings, PLT 03/29/16: FEV is 63% of predicted, there is no CVA reversibility with bronchodilator. FVC is 62% of predicted. Ratio is 74%. RV to TLC ratio is elevated, TLC is 84%. DLCO 79%. Overall this test is consistent with mild restrictive lung disease, there is slight air trapping which may be indicative of mild COPD.   Medication:   Reviewed.     Allergies:  Codeine; Crestor [rosuvastatin]; Other; Penicillins; Yellow jacket venom [bee venom]; Gemfibrozil; Statins; Trazodone and nefazodone; and Lipitor [atorvastatin]  Review of Systems:  Constitutional: Feels well. Cardiovascular: Denies chest pain, exertional chest pain.  Pulmonary: Denies hemoptysis, pleuritic chest pain.   The remainder of systems were reviewed and were found to be negative other than what is documented in the HPI.    Physical Examination:   VS: There were no vitals taken for this visit.  General Appearance: No distress  Neuro:without focal findings, mental status, speech normal, alert and oriented HEENT: PERRLA, EOM intact Pulmonary: No wheezing, No rales  CardiovascularNormal S1,S2.  No m/r/g.  Abdomen: Benign, Soft, non-tender, No masses Renal:  No costovertebral tenderness  GU:  No performed at this time. Endoc: No evident thyromegaly, no signs of acromegaly or Cushing features Skin:   warm, no rashes, no ecchymosis  Extremities: normal, no cyanosis, clubbing.  LABORATORY PANEL:   CBC No results for input(s): WBC, HGB, HCT, PLT in the last 168 hours. ------------------------------------------------------------------------------------------------------------------  Chemistries  No results for input(s): NA, K, CL, CO2, GLUCOSE, BUN, CREATININE, CALCIUM, MG, AST, ALT, ALKPHOS, BILITOT in the last 168 hours.  Invalid input(s): GFRCGP  ------------------------------------------------------------------------------------------------------------------  Cardiac Enzymes No results for input(s): TROPONINI in the last 168 hours. ------------------------------------------------------------  RADIOLOGY:  No results found.     Thank  you for the consultation and for allowing [facility] to assist in the care of your patient. Our recommendations are noted above.  Please contact us if we can be of further service.  Marda Stalker, M.D., F.C.C.P.  Board Certified in Internal Medicine, Pulmonary Medicine, [place], and Sleep Medicine.  [facility] Office Number: [phone]  07/01/2018

## 2018-07-01 NOTE — Chronic Care Management (AMB) (Signed)
   Chronic Care Management   Unsuccessful Call Note 07/01/2018 Name: Yvette Riley MRN: [id number] DOB: Mar 06, 1955  Yvette Riley is a 63 year old femalewho sees Dr. Lelon Huh for primary care. [name] RN, [facility] Acute Care Coordinatorasked the CCM team to consult the patient for care coordination and chronic case management post discharge from [facility] 06/24/2018. Patient has a history of but not limited to recent CVA, COPD, DM, and small cell lung CA. Referral was placed5/29/2020. Telephone outreach today to follow up on yesterdays conversation to see if patient wishes to consent to CCM services.    Was unable to reach patient via telephone. I have left HIPAA compliant voicemail asking patient to return my call. (unsuccessful outreach #1).   Plan: Will follow-up within 7 business days via telephone.    Atreus Hasz E. [name], RN, BSN Nurse Care Coordinator [facility] Care Management [phone]

## 2018-07-02 ENCOUNTER — Ambulatory Visit: Payer: Medicare Other | Admitting: Internal Medicine

## 2018-07-03 ENCOUNTER — Ambulatory Visit: Payer: Self-pay

## 2018-07-03 DIAGNOSIS — I10 Essential (primary) hypertension: Secondary | ICD-10-CM | POA: Diagnosis not present

## 2018-07-03 DIAGNOSIS — F411 Generalized anxiety disorder: Secondary | ICD-10-CM | POA: Diagnosis not present

## 2018-07-03 DIAGNOSIS — I69393 Ataxia following cerebral infarction: Secondary | ICD-10-CM | POA: Diagnosis not present

## 2018-07-03 DIAGNOSIS — E1140 Type 2 diabetes mellitus with diabetic neuropathy, unspecified: Secondary | ICD-10-CM | POA: Diagnosis not present

## 2018-07-03 DIAGNOSIS — J439 Emphysema, unspecified: Secondary | ICD-10-CM | POA: Diagnosis not present

## 2018-07-03 DIAGNOSIS — M5442 Lumbago with sciatica, left side: Secondary | ICD-10-CM | POA: Diagnosis not present

## 2018-07-03 NOTE — Chronic Care Management (AMB) (Signed)
  Chronic Care Management   Note  07/03/2018 Name: Yvette Riley MRN: [id number] DOB: 12-26-1955  Care Coordination: [name] is a 63 year old femalewho sees Dr. Lelon Huh for primary care. [name] RN, [facility] Post Acute Care Coordinatorasked the CCM team to consult the patient for care coordination and chronic case management post discharge from [facility] 06/24/2018. Patient has a history of but not limited to recent CVA, COPD, DM, and small cell lung CA. Referral was placed5/29/2020.   Today, CCM RN CM was able to speak with patient via telephone. Patient states she is currently driving and not able to talk. Request call back at a later time.  This is the third unsuccessful attempt to discuss and engage patient in CCM services.   Plan: RN CM will call patient back as discussed however if call is unsuccessful, outreach attempts will be suspended.  Rexanna Louthan E. [name], RN, BSN Nurse Care Coordinator [facility] Care Management [phone]

## 2018-07-05 NOTE — Progress Notes (Signed)
I have reviewed the Chronic Care Management note, was available for consultation, and agree with documentation and plan   Lelon Huh, MD

## 2018-07-07 ENCOUNTER — Telehealth: Payer: Self-pay

## 2018-07-07 ENCOUNTER — Encounter (INDEPENDENT_AMBULATORY_CARE_PROVIDER_SITE_OTHER): Payer: Medicare Other | Admitting: Family Medicine

## 2018-07-07 ENCOUNTER — Telehealth: Payer: Self-pay | Admitting: Family Medicine

## 2018-07-07 ENCOUNTER — Other Ambulatory Visit: Payer: Self-pay

## 2018-07-07 ENCOUNTER — Ambulatory Visit: Payer: Self-pay

## 2018-07-07 DIAGNOSIS — I10 Essential (primary) hypertension: Secondary | ICD-10-CM

## 2018-07-07 DIAGNOSIS — F411 Generalized anxiety disorder: Secondary | ICD-10-CM

## 2018-07-07 DIAGNOSIS — J449 Chronic obstructive pulmonary disease, unspecified: Secondary | ICD-10-CM

## 2018-07-07 DIAGNOSIS — J439 Emphysema, unspecified: Secondary | ICD-10-CM | POA: Diagnosis not present

## 2018-07-07 DIAGNOSIS — I69393 Ataxia following cerebral infarction: Secondary | ICD-10-CM | POA: Diagnosis not present

## 2018-07-07 DIAGNOSIS — J45998 Other asthma: Secondary | ICD-10-CM | POA: Diagnosis not present

## 2018-07-07 DIAGNOSIS — M5442 Lumbago with sciatica, left side: Secondary | ICD-10-CM | POA: Diagnosis not present

## 2018-07-07 DIAGNOSIS — E1140 Type 2 diabetes mellitus with diabetic neuropathy, unspecified: Secondary | ICD-10-CM | POA: Diagnosis not present

## 2018-07-07 DIAGNOSIS — R1310 Dysphagia, unspecified: Secondary | ICD-10-CM

## 2018-07-07 NOTE — Telephone Encounter (Signed)
Wes with [facility] just seen patient and would like to request 2 times a week for one week and 3 times for 3 weeks and 2 times a week for two more week  For PT.  He needs scripts for a rolling walker, and elevated toilet seat   He also request social work for [employer]  Faxed to [phone]  Thanks [employer]

## 2018-07-07 NOTE — Chronic Care Management (AMB) (Signed)
   Chronic Care Management   Unsuccessful Call Note 07/07/2018 Name: Yvette Riley MRN: [id number] DOB: 12/26/1955  Yvette Riley a 63year old femalewho seesDr. Terrilyn Saver primary care. Yvette Rolling RN, [facility] Acute Care Coordinatorasked the CCM team to consult the patient forcare coordination and chronic case management post discharge from SNF5/27/2020. Patient has a history of but not limited torecent CVA, COPD, DM, and small cell lung CA. Referral was placed5/29/2020. Telephone outreach today as scheduled for initial assessment.   Was unable to reach patient via telephone today. I ha ve left HIPAA compliant voicemail asking patient to return my call. (unsuccessful outreach #3).   Plan: Outreach attempts will be discontinued as RN CM has made multiple attempts to engage patient with services. She has been provided with CCM RN CM contact information if she chooses to engage at a later time.   Yvette Riley E. [name], RN, BSN Nurse Care Coordinator [facility] Care Management [phone]

## 2018-07-08 ENCOUNTER — Telehealth: Payer: Self-pay

## 2018-07-08 DIAGNOSIS — I69393 Ataxia following cerebral infarction: Secondary | ICD-10-CM | POA: Diagnosis not present

## 2018-07-08 DIAGNOSIS — M5442 Lumbago with sciatica, left side: Secondary | ICD-10-CM | POA: Diagnosis not present

## 2018-07-08 DIAGNOSIS — F411 Generalized anxiety disorder: Secondary | ICD-10-CM | POA: Diagnosis not present

## 2018-07-08 DIAGNOSIS — J439 Emphysema, unspecified: Secondary | ICD-10-CM | POA: Diagnosis not present

## 2018-07-08 DIAGNOSIS — E1140 Type 2 diabetes mellitus with diabetic neuropathy, unspecified: Secondary | ICD-10-CM | POA: Diagnosis not present

## 2018-07-08 DIAGNOSIS — I10 Essential (primary) hypertension: Secondary | ICD-10-CM | POA: Diagnosis not present

## 2018-07-08 NOTE — Telephone Encounter (Signed)
That's fine, have written order to be faxed

## 2018-07-08 NOTE — Telephone Encounter (Signed)
Yvette Riley @ [facility] needs verbal orders for occupational therapy twice a week for one week, then once week for three weeks.

## 2018-07-08 NOTE — Telephone Encounter (Signed)
Faxed written order for rolling walker and elevated toilet seat. Thanks TNP

## 2018-07-08 NOTE — Telephone Encounter (Signed)
That's fine

## 2018-07-09 DIAGNOSIS — E1140 Type 2 diabetes mellitus with diabetic neuropathy, unspecified: Secondary | ICD-10-CM | POA: Diagnosis not present

## 2018-07-09 DIAGNOSIS — I10 Essential (primary) hypertension: Secondary | ICD-10-CM | POA: Diagnosis not present

## 2018-07-09 DIAGNOSIS — F411 Generalized anxiety disorder: Secondary | ICD-10-CM | POA: Diagnosis not present

## 2018-07-09 DIAGNOSIS — M5442 Lumbago with sciatica, left side: Secondary | ICD-10-CM | POA: Diagnosis not present

## 2018-07-09 DIAGNOSIS — J439 Emphysema, unspecified: Secondary | ICD-10-CM | POA: Diagnosis not present

## 2018-07-09 DIAGNOSIS — I69393 Ataxia following cerebral infarction: Secondary | ICD-10-CM | POA: Diagnosis not present

## 2018-07-10 ENCOUNTER — Other Ambulatory Visit: Payer: Self-pay

## 2018-07-10 ENCOUNTER — Encounter (INDEPENDENT_AMBULATORY_CARE_PROVIDER_SITE_OTHER): Payer: Medicare Other | Admitting: Family Medicine

## 2018-07-10 DIAGNOSIS — I69393 Ataxia following cerebral infarction: Secondary | ICD-10-CM | POA: Diagnosis not present

## 2018-07-10 NOTE — Telephone Encounter (Signed)
I don't have a contact number for [place] with [facility].  Please give the verbal okay when she calls back.   Thanks,   -Mickel Baas

## 2018-07-13 ENCOUNTER — Telehealth: Payer: Self-pay

## 2018-07-13 DIAGNOSIS — M5442 Lumbago with sciatica, left side: Secondary | ICD-10-CM | POA: Diagnosis not present

## 2018-07-13 DIAGNOSIS — F411 Generalized anxiety disorder: Secondary | ICD-10-CM | POA: Diagnosis not present

## 2018-07-13 DIAGNOSIS — J439 Emphysema, unspecified: Secondary | ICD-10-CM | POA: Diagnosis not present

## 2018-07-13 DIAGNOSIS — I10 Essential (primary) hypertension: Secondary | ICD-10-CM | POA: Diagnosis not present

## 2018-07-13 DIAGNOSIS — E1140 Type 2 diabetes mellitus with diabetic neuropathy, unspecified: Secondary | ICD-10-CM | POA: Diagnosis not present

## 2018-07-13 DIAGNOSIS — I69393 Ataxia following cerebral infarction: Secondary | ICD-10-CM | POA: Diagnosis not present

## 2018-07-13 NOTE — Telephone Encounter (Signed)
requesting prescription for a 3 in 1 commode. Fax # [phone]

## 2018-07-15 ENCOUNTER — Telehealth: Payer: Self-pay | Admitting: Family Medicine

## 2018-07-15 DIAGNOSIS — I10 Essential (primary) hypertension: Secondary | ICD-10-CM | POA: Diagnosis not present

## 2018-07-15 DIAGNOSIS — F411 Generalized anxiety disorder: Secondary | ICD-10-CM | POA: Diagnosis not present

## 2018-07-15 DIAGNOSIS — M5442 Lumbago with sciatica, left side: Secondary | ICD-10-CM | POA: Diagnosis not present

## 2018-07-15 DIAGNOSIS — E1140 Type 2 diabetes mellitus with diabetic neuropathy, unspecified: Secondary | ICD-10-CM | POA: Diagnosis not present

## 2018-07-15 DIAGNOSIS — J439 Emphysema, unspecified: Secondary | ICD-10-CM | POA: Diagnosis not present

## 2018-07-15 DIAGNOSIS — I69393 Ataxia following cerebral infarction: Secondary | ICD-10-CM | POA: Diagnosis not present

## 2018-07-15 NOTE — Telephone Encounter (Signed)
Tried calling patient. Left message to call back. I can print off a list of her active medications from her chart for her to pick up.  Please confirm with patient that this is what she needs.

## 2018-07-15 NOTE — Telephone Encounter (Signed)
Pt called wanting to get a list of her medications and the times she is supposed to be taking them.  Her call back number is [phone]  Thanks teri

## 2018-07-16 DIAGNOSIS — J439 Emphysema, unspecified: Secondary | ICD-10-CM | POA: Diagnosis not present

## 2018-07-16 DIAGNOSIS — I69393 Ataxia following cerebral infarction: Secondary | ICD-10-CM | POA: Diagnosis not present

## 2018-07-16 DIAGNOSIS — E1140 Type 2 diabetes mellitus with diabetic neuropathy, unspecified: Secondary | ICD-10-CM | POA: Diagnosis not present

## 2018-07-16 DIAGNOSIS — M5442 Lumbago with sciatica, left side: Secondary | ICD-10-CM | POA: Diagnosis not present

## 2018-07-16 DIAGNOSIS — F411 Generalized anxiety disorder: Secondary | ICD-10-CM | POA: Diagnosis not present

## 2018-07-16 DIAGNOSIS — I10 Essential (primary) hypertension: Secondary | ICD-10-CM | POA: Diagnosis not present

## 2018-07-17 DIAGNOSIS — I69393 Ataxia following cerebral infarction: Secondary | ICD-10-CM | POA: Diagnosis not present

## 2018-07-17 DIAGNOSIS — F411 Generalized anxiety disorder: Secondary | ICD-10-CM | POA: Diagnosis not present

## 2018-07-17 DIAGNOSIS — M5442 Lumbago with sciatica, left side: Secondary | ICD-10-CM | POA: Diagnosis not present

## 2018-07-17 DIAGNOSIS — E1140 Type 2 diabetes mellitus with diabetic neuropathy, unspecified: Secondary | ICD-10-CM | POA: Diagnosis not present

## 2018-07-17 DIAGNOSIS — J439 Emphysema, unspecified: Secondary | ICD-10-CM | POA: Diagnosis not present

## 2018-07-17 DIAGNOSIS — I10 Essential (primary) hypertension: Secondary | ICD-10-CM | POA: Diagnosis not present

## 2018-07-20 DIAGNOSIS — F411 Generalized anxiety disorder: Secondary | ICD-10-CM | POA: Diagnosis not present

## 2018-07-20 DIAGNOSIS — J439 Emphysema, unspecified: Secondary | ICD-10-CM | POA: Diagnosis not present

## 2018-07-20 DIAGNOSIS — I10 Essential (primary) hypertension: Secondary | ICD-10-CM | POA: Diagnosis not present

## 2018-07-20 DIAGNOSIS — M5442 Lumbago with sciatica, left side: Secondary | ICD-10-CM | POA: Diagnosis not present

## 2018-07-20 DIAGNOSIS — I69393 Ataxia following cerebral infarction: Secondary | ICD-10-CM | POA: Diagnosis not present

## 2018-07-20 DIAGNOSIS — E1140 Type 2 diabetes mellitus with diabetic neuropathy, unspecified: Secondary | ICD-10-CM | POA: Diagnosis not present

## 2018-07-21 ENCOUNTER — Telehealth: Payer: Self-pay | Admitting: Family Medicine

## 2018-07-21 ENCOUNTER — Telehealth: Payer: Self-pay

## 2018-07-21 ENCOUNTER — Emergency Department
Admission: EM | Admit: 2018-07-21 | Discharge: 2018-07-21 | Disposition: A | Discharge status: Home / Self Care | Payer: Medicare Other | Attending: Emergency Medicine | Admitting: Emergency Medicine

## 2018-07-21 ENCOUNTER — Emergency Department: Payer: Medicare Other

## 2018-07-21 ENCOUNTER — Other Ambulatory Visit: Payer: Self-pay

## 2018-07-21 ENCOUNTER — Encounter: Payer: Self-pay | Admitting: Emergency Medicine

## 2018-07-21 DIAGNOSIS — G894 Chronic pain syndrome: Secondary | ICD-10-CM | POA: Diagnosis not present

## 2018-07-21 DIAGNOSIS — Z85118 Personal history of other malignant neoplasm of bronchus and lung: Secondary | ICD-10-CM | POA: Diagnosis not present

## 2018-07-21 DIAGNOSIS — F411 Generalized anxiety disorder: Secondary | ICD-10-CM | POA: Diagnosis not present

## 2018-07-21 DIAGNOSIS — Z7984 Long term (current) use of oral hypoglycemic drugs: Secondary | ICD-10-CM | POA: Insufficient documentation

## 2018-07-21 DIAGNOSIS — I10 Essential (primary) hypertension: Secondary | ICD-10-CM | POA: Diagnosis not present

## 2018-07-21 DIAGNOSIS — Z7902 Long term (current) use of antithrombotics/antiplatelets: Secondary | ICD-10-CM | POA: Insufficient documentation

## 2018-07-21 DIAGNOSIS — I639 Cerebral infarction, unspecified: Secondary | ICD-10-CM | POA: Diagnosis not present

## 2018-07-21 DIAGNOSIS — J45909 Unspecified asthma, uncomplicated: Secondary | ICD-10-CM | POA: Diagnosis not present

## 2018-07-21 DIAGNOSIS — M6281 Muscle weakness (generalized): Secondary | ICD-10-CM | POA: Diagnosis not present

## 2018-07-21 DIAGNOSIS — M5442 Lumbago with sciatica, left side: Secondary | ICD-10-CM | POA: Diagnosis not present

## 2018-07-21 DIAGNOSIS — I69393 Ataxia following cerebral infarction: Secondary | ICD-10-CM | POA: Diagnosis not present

## 2018-07-21 DIAGNOSIS — R0902 Hypoxemia: Secondary | ICD-10-CM | POA: Diagnosis not present

## 2018-07-21 DIAGNOSIS — Z79899 Other long term (current) drug therapy: Secondary | ICD-10-CM | POA: Insufficient documentation

## 2018-07-21 DIAGNOSIS — E119 Type 2 diabetes mellitus without complications: Secondary | ICD-10-CM | POA: Insufficient documentation

## 2018-07-21 DIAGNOSIS — Z7982 Long term (current) use of aspirin: Secondary | ICD-10-CM | POA: Insufficient documentation

## 2018-07-21 DIAGNOSIS — I2510 Atherosclerotic heart disease of native coronary artery without angina pectoris: Secondary | ICD-10-CM | POA: Insufficient documentation

## 2018-07-21 DIAGNOSIS — Z87891 Personal history of nicotine dependence: Secondary | ICD-10-CM | POA: Insufficient documentation

## 2018-07-21 DIAGNOSIS — R531 Weakness: Secondary | ICD-10-CM | POA: Diagnosis present

## 2018-07-21 DIAGNOSIS — R000 Tachycardia, unspecified: Secondary | ICD-10-CM | POA: Diagnosis not present

## 2018-07-21 DIAGNOSIS — J439 Emphysema, unspecified: Secondary | ICD-10-CM | POA: Diagnosis not present

## 2018-07-21 DIAGNOSIS — E1140 Type 2 diabetes mellitus with diabetic neuropathy, unspecified: Secondary | ICD-10-CM | POA: Diagnosis not present

## 2018-07-21 LAB — URINALYSIS, COMPLETE (UACMP) WITH MICROSCOPIC
Bacteria, UA: NONE SEEN
Bilirubin Urine: NEGATIVE
Glucose, UA: NEGATIVE mg/dL
Hgb urine dipstick: NEGATIVE
Ketones, ur: 20 mg/dL — AB
Leukocytes,Ua: NEGATIVE
Nitrite: NEGATIVE
Protein, ur: 30 mg/dL — AB
Specific Gravity, Urine: 1.03 (ref 1.005–1.030)
pH: 5 (ref 5.0–8.0)

## 2018-07-21 LAB — BASIC METABOLIC PANEL
Anion gap: 12 (ref 5–15)
BUN: 11 mg/dL (ref 8–23)
CO2: 24 mmol/L (ref 22–32)
Calcium: 9.1 mg/dL (ref 8.9–10.3)
Chloride: 101 mmol/L (ref 98–111)
Creatinine, Ser: 0.79 mg/dL (ref 0.44–1.00)
GFR calc Af Amer: >60 mL/min (ref >60)
GFR calc non Af Amer: >60 mL/min (ref >60)
Glucose, Bld: 145 mg/dL — ABNORMAL HIGH (ref 70–99)
Potassium: 3.7 mmol/L (ref 3.5–5.1)
Sodium: 137 mmol/L (ref 135–145)

## 2018-07-21 LAB — CBC
HCT: 41.2 % (ref 36.0–46.0)
Hemoglobin: 13.1 g/dL (ref 12.0–15.0)
MCH: 30.1 pg (ref 26.0–34.0)
MCHC: 31.8 g/dL (ref 30.0–36.0)
MCV: 94.7 fL (ref 80.0–100.0)
Platelets: 292 10*3/uL (ref 150–400)
RBC: 4.35 MIL/uL (ref 3.87–5.11)
RDW: 13.5 % (ref 11.5–15.5)
WBC: 7.2 10*3/uL (ref 4.0–10.5)
nRBC: 0 % (ref 0.0–0.2)

## 2018-07-21 NOTE — ED Notes (Signed)
Patient able to tolerate PO water well
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
## 2018-07-21 NOTE — Discharge Instructions (Addendum)
Continue taking all your medications as prescribed. You should continue taking plavix daily to prevent strokes in the future.

## 2018-07-21 NOTE — ED Triage Notes (Signed)
Pt presents to ED via [facility] c/o pain to R arm, leg, and face. Pt states she was at short term rehab for 4 days after being discharged from the hospital (admitted for CVA). Pt left rehab AMA after 4 days. States her son and his girlfriend were supposed to take care of her at home but they have not been doing so and she wants to go back to rehab center.   No focal weakness, no slurred speech, no facial droop noted at this time.

## 2018-07-21 NOTE — Telephone Encounter (Signed)
FYI...   Pt called reporting that she is worried she is having another stroke.  She states she has increased weakness and numbness in her face and arm.  She says she is at her neighbors house right now.  I advised her to stay there and call 911.   She agreed to stay with her neighbor and call.   Thanks,   -Mickel Baas

## 2018-07-21 NOTE — ED Notes (Signed)
Patient discharged home via [facility], patient ambulatory and steady at discharge, will wait outside for cab. Condition good at discharge

## 2018-07-21 NOTE — ED Notes (Signed)
Patient transported to CT 

## 2018-07-21 NOTE — ED Notes (Signed)
This RN is attempting to contact [employer], no response
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
## 2018-07-21 NOTE — Telephone Encounter (Signed)
Are you able to refer her to a rehab center?   Thanks,   -Mickel Baas

## 2018-07-21 NOTE — ED Triage Notes (Signed)
Pt comes into the ED EMS from home, states she recently had a stroke and was admitted and went to [facility] health care but left from there AMA and now want to be admitted back there for rehab. EMS reports pt ambulatory on scene without any difficulty.

## 2018-07-21 NOTE — Telephone Encounter (Signed)
Alois Cliche w/ [facility]  Verbal Orders: Additional social work visit Pt wants to discuss placement options as her son is no longer helping her.  Catalina Lunger sould like to see pt this week if possible.  Thanks, [employer]

## 2018-07-21 NOTE — ED Provider Notes (Addendum)
[facility] Emergency Department Provider Note  ____________________________________________  Time seen: Approximately 10:22 PM  I have reviewed the triage vital signs and the nursing notes.   HISTORY  Chief Complaint Right-sided weakness   HPI Yvette Riley is a 63 y.o. female with a history of anxiety, COPD diabetes hypertension and recent hospitalization due to a cerebellar infarct  comes to the ED complaining of weakness in the right arm leg and face, which are persistent from her previous stroke a month ago.  She had been discharged from the hospital to a acute rehab.  She reports that she signed herself out of the early to go home, but then found that her family was not taking good care of her and so wants to be readmitted to the acute rehab.  Review of the electronic medical record shows that the patient has been in touch with her primary care clinic earlier today requesting social work evaluation for rehab placement.  They have arranged for social work to see the patient at home as noted in this telephone note. "Alois Cliche w/ [facility] [phone]  Verbal Orders: Additional social work visit Pt wants to discuss placement options as her son is no longer helping her.  Catalina Lunger sould like to see pt this week if possible.  Thanks, [place]"  There is also a note that somebody contacted the primary care clinic to report that they suspected the patient of selling her opioid pain medication.  The patient was ultimately referred to the ED after multiple calls to her primary care clinic today, the last of which she reported being worried that she might be having a new stroke.  EMS report that the patient was ambulatory on scene.  Past Medical History:  Diagnosis Date  . Allergy   . Anemia   . Anxiety   . Arthritis   . Asthma   . Blood transfusion without reported diagnosis   . C. difficile diarrhea 04/07/2016  . CAP (community  acquired pneumonia) 03/21/2015  . Combined forms of age-related cataract of both eyes 05/06/2016  . COPD (chronic obstructive pulmonary disease) ([place])   . CVA (cerebral vascular accident) ([place]) 06/17/2018  . Diabetes mellitus without complication ([place])   . Displacement of lumbar intervertebral disc without myelopathy   . Emphysema of lung ([place])   . GERD (gastroesophageal reflux disease)   . Glaucoma   . History of chicken pox   . Hypertension   . Pneumonia 03/29/2015  . Sepsis ([place]) 03/17/2016  . Shortness of breath   . Small cell lung cancer [facility])      Patient Active Problem List   Diagnosis Date Noted  . Cerebellar infarct ([place]) 06/17/2018  . Chronic cough 06/02/2018  . Anxiety 07/19/2016  . Small cell lung cancer, right ([place]) 05/13/2016  . Bilateral presbyopia 05/06/2016  . Combined forms of age-related cataract of both eyes 05/06/2016  . Dry eye syndrome of both lacrimal glands 05/06/2016  . Chemoprophylaxis 04/29/2016  . Severe obesity (BMI 35.0-39.9) with comorbidity ([place]) 04/16/2016  . Elevated d-dimer 04/06/2016  . Weakness generalized 04/06/2016  . Mass of upper lobe of right lung 03/01/2016  . Coronary atherosclerosis 02/29/2016  . Allergic rhinitis 09/27/2014  . Asthma 09/27/2014  . Anemia 09/27/2014  . Hyperglyceridemia, pure 09/27/2014  . Glaucoma 09/27/2014  . Hip pain 09/27/2014  . Right knee pain 09/27/2014  . Displacement of lumbar intervertebral disc without myelopathy 09/27/2014  . Thoracic back pain 09/27/2014  .  Insomnia 08/11/2014  . COPD (chronic obstructive pulmonary disease) ([place]) 06/12/2014  . Type 2 diabetes mellitus ([place]) 06/12/2014  . Back pain 06/12/2014  . Essential hypertension 03/28/2014  . Smoking greater than 30 pack years 03/28/2014     Past Surgical History:  Procedure Laterality Date  . CESAREAN SECTION    . TUBAL LIGATION       Prior to Admission medications   Medication Sig Start Date End Date Taking? Authorizing Provider   albuterol (PROVENTIL) (2.5 MG/3ML) 0.083% nebulizer solution Take 3 mLs (2.5 mg total) by nebulization every 6 (six) hours as needed for wheezing or shortness of breath. 07/22/17   Jacquelin Hawking, NP  aspirin 81 MG chewable tablet Chew 1 tablet (81 mg total) by mouth daily. 06/20/18   Gladstone Lighter, MD  benzonatate (TESSALON PERLES) 100 MG capsule Take 1 capsule (100 mg total) by mouth 3 (three) times daily as needed for cough. 06/02/18 06/02/19  Birdie Sons, MD  clonazePAM (KLONOPIN) 0.5 MG tablet Take 1 tablet (0.5 mg total) by mouth 2 (two) times daily for 15 days. 06/19/18 07/04/18  Gladstone Lighter, MD  clopidogrel (PLAVIX) 75 MG tablet Take 1 tablet (75 mg total) by mouth daily. 06/20/18   Gladstone Lighter, MD  ezetimibe (ZETIA) 10 MG tablet Take 1 tablet (10 mg total) by mouth daily. 06/09/18   Birdie Sons, MD  gabapentin (NEURONTIN) 300 MG capsule Take 1 capsule (300 mg total) by mouth 3 (three) times daily. 06/24/18   Birdie Sons, MD  glucose blood test strip Contour test strip. Use to check blood sugar once a day for for type 2 diabetes (E11.9) 08/13/17   Birdie Sons, MD  guaiFENesin (MUCINEX) 600 MG 12 hr tablet Take 1 tablet (600 mg total) by mouth 2 (two) times daily. 06/19/18   Gladstone Lighter, MD  Lancets MISC Use to check blood sugar once daily for type 2 diabetes E11.9 10/02/17   Birdie Sons, MD  meloxicam (MOBIC) 15 MG tablet TAKE 1 TABLET BY MOUTH ONCE DAILY AS NEEDED FOR PAIN Patient taking differently: Take 15 mg by mouth daily.  04/27/18   Birdie Sons, MD  metFORMIN (GLUCOPHAGE XR) 500 MG 24 hr tablet Take 2 tablets (1,000 mg total) by mouth daily with breakfast. With meals Patient taking differently: Take 500 mg by mouth 2 (two) times daily with a meal. With meals 07/11/17   Birdie Sons, MD  montelukast (SINGULAIR) 10 MG tablet Take 1 tablet (10 mg total) by mouth at bedtime. 10/03/17   Birdie Sons, MD  NARCAN 4 MG/0.1ML LIQD nasal spray kit  Place 0.4 mg into the nose once.  07/07/17   [provider]  oxyCODONE-acetaminophen (PERCOCET) 10-325 MG tablet One tablet every 4-6 hours as needed. 06/30/18   Birdie Sons, MD  PROAIR HFA 108 [phone] Base) MCG/ACT inhaler INHALE 2 PUFFS BY MOUTH EVERY 6 HOURS AS NEEDED FOR WHEEZING OR SHORTNESS OF BREATH 07/22/17   [provider]  ramelteon (ROZEREM) 8 MG tablet Take 1 tablet (8 mg total) by mouth at bedtime. 06/02/18   Birdie Sons, MD  umeclidinium-vilanterol (ANORO ELLIPTA) 62.5-25 MCG/INH AEPB Inhale 1 puff into the lungs daily. 01/05/18   Birdie Sons, MD  zaleplon (SONATA) 10 MG capsule TAKE 1 CAPSULE BY MOUTH IN THE EVENING. MAY TAKE SECOND CAPSULE AFTER 2 HOURS IF NEEDED 06/24/18   Birdie Sons, MD     Allergies Codeine, Crestor [rosuvastatin], Other, Penicillins, Yellow  jacket venom [bee venom], Gemfibrozil, Statins, Trazodone and nefazodone, and Lipitor [atorvastatin]   Family History  Problem Relation Age of Onset  . Heart failure Mother   . Heart disease Mother   . Stroke Mother   . Heart disease Brother   . COPD Brother   . Cancer Maternal Aunt        Breast Cancer    Social History Social History   Tobacco Use  . Smoking status: Former Smoker    Packs/day: 0.25    Years: 45.00    Pack years: 11.25    Types: Cigarettes    Quit date: 03/17/2016    Years since quitting: 2.3  . Smokeless tobacco: Never Used  . Tobacco comment: pt states she quit in 2016-2017  Substance Use Topics  . Alcohol use: No    Alcohol/week: 0.0 standard drinks  . Drug use: No    Review of Systems  Constitutional:   No fever or chills.  ENT:   No sore throat. No rhinorrhea. Cardiovascular:   No chest pain or syncope. Respiratory:   No dyspnea or cough. Gastrointestinal:   Negative for abdominal pain, vomiting and diarrhea.  Musculoskeletal:   Negative for focal pain or swelling All other systems reviewed and are negative except as documented above in ROS  and HPI.  ____________________________________________   PHYSICAL EXAM:  VITAL SIGNS: ED Triage Vitals  Enc Vitals Group     BP 07/21/18 1726 132/88     Pulse Rate 07/21/18 1726 (!) 110     Resp 07/21/18 1726 18     Temp 07/21/18 1726 97.8 F (36.6 C)     Temp Source 07/21/18 1726 Oral     SpO2 07/21/18 1726 93 %     Weight 07/21/18 1729 180 lb (81.6 kg)     Height 07/21/18 1729 4' 9.5" (1.461 m)     Head Circumference --      Peak Flow --      Pain Score 07/21/18 1727 10     Pain Loc --      Pain Edu? --      Excl. in [place]? --     Vital signs reviewed, nursing assessments reviewed.   Constitutional:   Alert and oriented. Non-toxic appearance. Eyes:   Conjunctivae are normal. EOMI. PERRL. ENT      Head:   Normocephalic and atraumatic.      Nose:   No congestion/rhinnorhea.       Mouth/Throat:   MMM, no pharyngeal erythema. No peritonsillar mass.       Neck:   No meningismus. Full ROM. Hematological/Lymphatic/Immunilogical:   No cervical lymphadenopathy. Cardiovascular:   RRR. Symmetric bilateral radial and DP pulses.  No murmurs. Cap refill less than 2 seconds. Respiratory:   Normal respiratory effort without tachypnea/retractions. Breath sounds are clear and equal bilaterally. No wheezes/rales/rhonchi. Gastrointestinal:   Soft and nontender. Non distended. There is no CVA tenderness.  No rebound, rigidity, or guarding.  Musculoskeletal:   Normal range of motion in all extremities. No joint effusions.  No lower extremity tenderness.  No edema. Neurologic:   Normal speech and language.  Motor grossly intact. Mild limb ataxia in the right upper extremity on finger-to-nose NIH stroke scale is 1, chronic No acute focal neurologic deficits are appreciated.  Skin:    Skin is warm, dry and intact. No rash noted.  No petechiae, purpura, or bullae.  ____________________________________________    LABS (pertinent positives/negatives) (all labs ordered are listed, but only  abnormal results are displayed) Labs Reviewed  BASIC METABOLIC PANEL - Abnormal; Notable for the following components:      Result Value   Glucose, Bld 145 (*)    All other components within normal limits  URINALYSIS, COMPLETE (UACMP) WITH MICROSCOPIC - Abnormal; Notable for the following components:   Color, Urine YELLOW (*)    APPearance CLEAR (*)    Ketones, ur 20 (*)    Protein, ur 30 (*)    All other components within normal limits  CBC   ____________________________________________   EKG Interpreted by me Sinus tachycardia rate 113, right axis, normal intervals.  Right bundle branch block.  No acute ischemic changes.   ____________________________________________    RADIOLOGY  Ct Head Wo Contrast  Result Date: 07/21/2018 CLINICAL DATA:  Stroke. EXAM: CT HEAD WITHOUT CONTRAST TECHNIQUE: Contiguous axial images were obtained from the base of the skull through the vertex without intravenous contrast. COMPARISON:  MRI dated Jun 17, 2018.  CT dated Jun 17, 2018 FINDINGS: Brain: Again identified is loss of gray-white differentiation involving the right superior cerebellum. There is a new hypoattenuating area within the right occipital lobe measuring approximately 2.7 cm. There is no midline shift. No acute intracranial hemorrhage. Vascular: No hyperdense vessel or unexpected calcification. Skull: Normal. Negative for fracture or focal lesion. Sinuses/Orbits: No acute finding. Other: None. IMPRESSION: 1. New hypoattenuating area in the right occipital lobe is concerning for an age indeterminate infarct, new since Jun 17, 2018. 2. Evolving infarct involving the right superior cerebellum. 3. No mass effect. No midline shift. No acute intracranial hemorrhage. These results were called by telephone at the time of interpretation on 07/21/2018 at 10:48 pm to Dr. Carrie Mew , who verbally acknowledged these results. Electronically Signed   By: Constance Holster M.D.   On: 07/21/2018 22:51     ____________________________________________   PROCEDURES Procedures  ____________________________________________  DIFFERENTIAL DIAGNOSIS   Recurrent stroke, hemorrhagic conversion of previous infarct, malingering  CLINICAL IMPRESSION / ASSESSMENT AND PLAN / ED COURSE  Medications ordered in the ED: Medications - No data to display  Pertinent labs & imaging results that were available during my care of the patient were reviewed by me and considered in my medical decision making (see chart for details).  LANDRIE BEALE was evaluated in Emergency Department on 07/21/2018 for the symptoms described in the history of present illness. She was evaluated in the context of the global COVID-19 pandemic, which necessitated consideration that the patient might be at risk for infection with the SARS-CoV-2 virus that causes COVID-19. Institutional protocols and algorithms that pertain to the evaluation of patients at risk for COVID-19 are in a state of rapid change based on information released by regulatory bodies including the CDC and federal and state organizations. These policies and algorithms were followed during the patient's care in the ED.   Patient presents with persistent right-sided weakness after recent known stroke a month ago.  This is not unexpected.  Does not appear to be any worse and in fact symptoms are relatively mild at this time.  No other acute symptoms.  Exam is reassuring.  Labs and CT of the head do not reveal any new acute issues.  I discussed the CT head with the radiologist who does note that there is a new right-sided occipital infarct since May 20, but age-indeterminate and unlikely to happen within the past few hours.  Additionally this does not anatomically wind up with any of her symptoms which are attributable  to the old cerebellar infarct.  PCP is already arranging home social work/case management evaluation, and patient can be discharged with family to continue  outpatient management.  On further conversation with the patient she does reveal that part of her motivation for coming to the hospital today is because she does not feel like the social worker her primary care doctor would find for her would be reliable and she trusts a hospital social worker more.  She also states that she has no family in the area and no way to get home if we did want to discharge her home but when I informed her that she is stable for discharge and can continue outpatient follow-up and that we will not be giving her opioids in the ED, she reported she could call her daughter in law to come.  The does appear to be a malingering component to her presentation today.        ____________________________________________   FINAL CLINICAL IMPRESSION(S) / ED DIAGNOSES    Final diagnoses:  Chronic pain syndrome  Right-sided muscle weakness     ED Discharge Orders    None      Portions of this note were generated with dragon dictation software. Dictation errors may occur despite best attempts at proofreading.   Carrie Mew, MD 07/21/18 2315    Carrie Mew, MD 07/21/18 Janus Molder    Carrie Mew, MD 08/06/18 [phone]

## 2018-07-22 ENCOUNTER — Telehealth: Payer: Self-pay | Admitting: Family Medicine

## 2018-07-22 DIAGNOSIS — I69393 Ataxia following cerebral infarction: Secondary | ICD-10-CM | POA: Diagnosis not present

## 2018-07-22 DIAGNOSIS — E1140 Type 2 diabetes mellitus with diabetic neuropathy, unspecified: Secondary | ICD-10-CM | POA: Diagnosis not present

## 2018-07-22 DIAGNOSIS — I10 Essential (primary) hypertension: Secondary | ICD-10-CM | POA: Diagnosis not present

## 2018-07-22 DIAGNOSIS — J439 Emphysema, unspecified: Secondary | ICD-10-CM | POA: Diagnosis not present

## 2018-07-22 DIAGNOSIS — F411 Generalized anxiety disorder: Secondary | ICD-10-CM | POA: Diagnosis not present

## 2018-07-22 DIAGNOSIS — M5442 Lumbago with sciatica, left side: Secondary | ICD-10-CM | POA: Diagnosis not present

## 2018-07-22 NOTE — Telephone Encounter (Signed)
Pt went to hospital last night.  They will not send pt back into a nursing home.    Pt states her therapist is telling her she needs the following for care:    Small bath tub chair Walker with a seat  She also wants to ask if she can go to another nursing home, not the one she originally was sent to?  Please advise.  Thanks, [employer]

## 2018-07-22 NOTE — Telephone Encounter (Signed)
OK for social work referral

## 2018-07-22 NOTE — Telephone Encounter (Signed)
Orders written

## 2018-07-23 DIAGNOSIS — I69393 Ataxia following cerebral infarction: Secondary | ICD-10-CM | POA: Diagnosis not present

## 2018-07-23 DIAGNOSIS — F411 Generalized anxiety disorder: Secondary | ICD-10-CM | POA: Diagnosis not present

## 2018-07-23 DIAGNOSIS — J439 Emphysema, unspecified: Secondary | ICD-10-CM | POA: Diagnosis not present

## 2018-07-23 DIAGNOSIS — M5442 Lumbago with sciatica, left side: Secondary | ICD-10-CM | POA: Diagnosis not present

## 2018-07-23 DIAGNOSIS — E1140 Type 2 diabetes mellitus with diabetic neuropathy, unspecified: Secondary | ICD-10-CM | POA: Diagnosis not present

## 2018-07-23 DIAGNOSIS — I10 Essential (primary) hypertension: Secondary | ICD-10-CM | POA: Diagnosis not present

## 2018-07-23 NOTE — Telephone Encounter (Signed)
Verbal order given as below.

## 2018-07-24 DIAGNOSIS — I10 Essential (primary) hypertension: Secondary | ICD-10-CM | POA: Diagnosis not present

## 2018-07-24 DIAGNOSIS — E1140 Type 2 diabetes mellitus with diabetic neuropathy, unspecified: Secondary | ICD-10-CM | POA: Diagnosis not present

## 2018-07-24 DIAGNOSIS — M5442 Lumbago with sciatica, left side: Secondary | ICD-10-CM | POA: Diagnosis not present

## 2018-07-24 DIAGNOSIS — F411 Generalized anxiety disorder: Secondary | ICD-10-CM | POA: Diagnosis not present

## 2018-07-24 DIAGNOSIS — I69393 Ataxia following cerebral infarction: Secondary | ICD-10-CM | POA: Diagnosis not present

## 2018-07-24 DIAGNOSIS — J439 Emphysema, unspecified: Secondary | ICD-10-CM | POA: Diagnosis not present

## 2018-07-26 DIAGNOSIS — I69393 Ataxia following cerebral infarction: Secondary | ICD-10-CM | POA: Diagnosis not present

## 2018-07-26 DIAGNOSIS — E669 Obesity, unspecified: Secondary | ICD-10-CM | POA: Diagnosis not present

## 2018-07-26 DIAGNOSIS — Z6836 Body mass index (BMI) 36.0-36.9, adult: Secondary | ICD-10-CM | POA: Diagnosis not present

## 2018-07-26 DIAGNOSIS — M5441 Lumbago with sciatica, right side: Secondary | ICD-10-CM | POA: Diagnosis not present

## 2018-07-26 DIAGNOSIS — R1310 Dysphagia, unspecified: Secondary | ICD-10-CM | POA: Diagnosis not present

## 2018-07-26 DIAGNOSIS — Z7984 Long term (current) use of oral hypoglycemic drugs: Secondary | ICD-10-CM | POA: Diagnosis not present

## 2018-07-26 DIAGNOSIS — H409 Unspecified glaucoma: Secondary | ICD-10-CM | POA: Diagnosis not present

## 2018-07-26 DIAGNOSIS — I10 Essential (primary) hypertension: Secondary | ICD-10-CM | POA: Diagnosis not present

## 2018-07-26 DIAGNOSIS — E1140 Type 2 diabetes mellitus with diabetic neuropathy, unspecified: Secondary | ICD-10-CM | POA: Diagnosis not present

## 2018-07-26 DIAGNOSIS — Z9181 History of falling: Secondary | ICD-10-CM | POA: Diagnosis not present

## 2018-07-26 DIAGNOSIS — M5442 Lumbago with sciatica, left side: Secondary | ICD-10-CM | POA: Diagnosis not present

## 2018-07-26 DIAGNOSIS — J45998 Other asthma: Secondary | ICD-10-CM | POA: Diagnosis not present

## 2018-07-26 DIAGNOSIS — K219 Gastro-esophageal reflux disease without esophagitis: Secondary | ICD-10-CM | POA: Diagnosis not present

## 2018-07-26 DIAGNOSIS — M1990 Unspecified osteoarthritis, unspecified site: Secondary | ICD-10-CM | POA: Diagnosis not present

## 2018-07-26 DIAGNOSIS — J439 Emphysema, unspecified: Secondary | ICD-10-CM | POA: Diagnosis not present

## 2018-07-26 DIAGNOSIS — F411 Generalized anxiety disorder: Secondary | ICD-10-CM | POA: Diagnosis not present

## 2018-07-26 DIAGNOSIS — Z87891 Personal history of nicotine dependence: Secondary | ICD-10-CM | POA: Diagnosis not present

## 2018-07-26 DIAGNOSIS — Z85118 Personal history of other malignant neoplasm of bronchus and lung: Secondary | ICD-10-CM | POA: Diagnosis not present

## 2018-07-27 DIAGNOSIS — M5442 Lumbago with sciatica, left side: Secondary | ICD-10-CM | POA: Diagnosis not present

## 2018-07-27 DIAGNOSIS — I69393 Ataxia following cerebral infarction: Secondary | ICD-10-CM | POA: Diagnosis not present

## 2018-07-27 DIAGNOSIS — E1140 Type 2 diabetes mellitus with diabetic neuropathy, unspecified: Secondary | ICD-10-CM | POA: Diagnosis not present

## 2018-07-27 DIAGNOSIS — J439 Emphysema, unspecified: Secondary | ICD-10-CM | POA: Diagnosis not present

## 2018-07-27 DIAGNOSIS — F411 Generalized anxiety disorder: Secondary | ICD-10-CM | POA: Diagnosis not present

## 2018-07-27 DIAGNOSIS — I10 Essential (primary) hypertension: Secondary | ICD-10-CM | POA: Diagnosis not present

## 2018-07-28 ENCOUNTER — Other Ambulatory Visit: Payer: Self-pay | Admitting: *Deleted

## 2018-07-28 DIAGNOSIS — J439 Emphysema, unspecified: Secondary | ICD-10-CM | POA: Diagnosis not present

## 2018-07-28 DIAGNOSIS — I69393 Ataxia following cerebral infarction: Secondary | ICD-10-CM | POA: Diagnosis not present

## 2018-07-28 DIAGNOSIS — R05 Cough: Secondary | ICD-10-CM

## 2018-07-28 DIAGNOSIS — G8929 Other chronic pain: Secondary | ICD-10-CM

## 2018-07-28 DIAGNOSIS — I10 Essential (primary) hypertension: Secondary | ICD-10-CM | POA: Diagnosis not present

## 2018-07-28 DIAGNOSIS — M5442 Lumbago with sciatica, left side: Secondary | ICD-10-CM | POA: Diagnosis not present

## 2018-07-28 DIAGNOSIS — E1140 Type 2 diabetes mellitus with diabetic neuropathy, unspecified: Secondary | ICD-10-CM | POA: Diagnosis not present

## 2018-07-28 DIAGNOSIS — R053 Chronic cough: Secondary | ICD-10-CM

## 2018-07-28 DIAGNOSIS — F411 Generalized anxiety disorder: Secondary | ICD-10-CM | POA: Diagnosis not present

## 2018-07-28 NOTE — Telephone Encounter (Signed)
Patient stated she dropped her pain medication in a sink full of soapy water. Advised pt that Dr. Caryn Section is out of office this week.

## 2018-07-29 ENCOUNTER — Other Ambulatory Visit: Payer: Self-pay

## 2018-07-29 DIAGNOSIS — M5442 Lumbago with sciatica, left side: Secondary | ICD-10-CM | POA: Diagnosis not present

## 2018-07-29 DIAGNOSIS — I69393 Ataxia following cerebral infarction: Secondary | ICD-10-CM | POA: Diagnosis not present

## 2018-07-29 DIAGNOSIS — F411 Generalized anxiety disorder: Secondary | ICD-10-CM | POA: Diagnosis not present

## 2018-07-29 DIAGNOSIS — J439 Emphysema, unspecified: Secondary | ICD-10-CM | POA: Diagnosis not present

## 2018-07-29 DIAGNOSIS — E1140 Type 2 diabetes mellitus with diabetic neuropathy, unspecified: Secondary | ICD-10-CM | POA: Diagnosis not present

## 2018-07-29 DIAGNOSIS — I10 Essential (primary) hypertension: Secondary | ICD-10-CM | POA: Diagnosis not present

## 2018-07-29 MED ORDER — GABAPENTIN 300 MG PO CAPS: 300.0000 mg | ORAL_CAPSULE | Freq: Three times a day (TID) | ORAL | 5 refills | Status: DC

## 2018-07-29 MED ORDER — BENZONATATE 100 MG PO CAPS: 100.0000 mg | ORAL_CAPSULE | Freq: Three times a day (TID) | ORAL | 3 refills | Status: DC | PRN

## 2018-07-29 NOTE — Telephone Encounter (Signed)
Oxycodone refill is due on 07/31/2018. Will not approve early refill. Will send refill to her pharmacy before the 3rd.

## 2018-07-30 ENCOUNTER — Other Ambulatory Visit: Payer: Self-pay

## 2018-07-30 DIAGNOSIS — F411 Generalized anxiety disorder: Secondary | ICD-10-CM | POA: Diagnosis not present

## 2018-07-30 DIAGNOSIS — E1140 Type 2 diabetes mellitus with diabetic neuropathy, unspecified: Secondary | ICD-10-CM | POA: Diagnosis not present

## 2018-07-30 DIAGNOSIS — J439 Emphysema, unspecified: Secondary | ICD-10-CM | POA: Diagnosis not present

## 2018-07-30 DIAGNOSIS — I69393 Ataxia following cerebral infarction: Secondary | ICD-10-CM | POA: Diagnosis not present

## 2018-07-30 DIAGNOSIS — M5442 Lumbago with sciatica, left side: Secondary | ICD-10-CM

## 2018-07-30 DIAGNOSIS — G8929 Other chronic pain: Secondary | ICD-10-CM

## 2018-07-30 DIAGNOSIS — I10 Essential (primary) hypertension: Secondary | ICD-10-CM | POA: Diagnosis not present

## 2018-07-30 MED ORDER — OXYCODONE-ACETAMINOPHEN 10-325 MG PO TABS: ORAL_TABLET | ORAL | 0 refills | Status: DC

## 2018-08-03 ENCOUNTER — Encounter: Payer: Self-pay | Admitting: Family Medicine

## 2018-08-03 ENCOUNTER — Ambulatory Visit (INDEPENDENT_AMBULATORY_CARE_PROVIDER_SITE_OTHER): Payer: Medicare Other | Admitting: Family Medicine

## 2018-08-03 ENCOUNTER — Other Ambulatory Visit: Payer: Self-pay

## 2018-08-03 VITALS — BP 112/72 | HR 88 | Temp 98.1°F | Resp 20 | Wt 172.0 lb

## 2018-08-03 DIAGNOSIS — E1129 Type 2 diabetes mellitus with other diabetic kidney complication: Secondary | ICD-10-CM

## 2018-08-03 DIAGNOSIS — F1190 Opioid use, unspecified, uncomplicated: Secondary | ICD-10-CM | POA: Diagnosis not present

## 2018-08-03 DIAGNOSIS — R809 Proteinuria, unspecified: Secondary | ICD-10-CM | POA: Diagnosis not present

## 2018-08-03 DIAGNOSIS — M546 Pain in thoracic spine: Secondary | ICD-10-CM

## 2018-08-03 DIAGNOSIS — I639 Cerebral infarction, unspecified: Secondary | ICD-10-CM | POA: Diagnosis not present

## 2018-08-03 DIAGNOSIS — M5126 Other intervertebral disc displacement, lumbar region: Secondary | ICD-10-CM

## 2018-08-03 DIAGNOSIS — G8929 Other chronic pain: Secondary | ICD-10-CM

## 2018-08-03 LAB — POCT UA - MICROALBUMIN: Microalbumin Ur, POC: 50 mg/L

## 2018-08-03 NOTE — Progress Notes (Signed)
Patient: Yvette Riley Female    DOB: [birth year]   63 y.o.   MRN: [id number] Visit Date: 08/03/2018  Today's Provider: Lelon Huh, MD   Chief Complaint  Patient presents with  . Cerebrovascular Accident    One month follow up   Subjective:     HPI    Follow up for Cerebrovascular Accident  The patient was last seen for this 1 months ago. Changes made at last visit include No changes. She did present to ER on 07-21-2018 with worsening right sided weakness. CT revealed a new right sided occipital infarct and right superior cerebellar infarct.  ER physician documented that after discussing case with radiologist it was not felt to be an acute CVA. Patient today state that she was not taking aspirin consistently at that time, but has been since.   She reports excellent compliance with treatment.  Pt states she finished PT a few days ago.  She states overall she has improved but still has some weakness on the right side.  Is ambulating with walker.  She feels that condition is Improved. She is not having side effects.   ------------------------------------------------------------------------------------  She is also here for follow up chronic pain back secondary lumbar disk disease. She reports current medications continue to work fairly well, although she still has moderate pain everyday. Feels like pain is not limiting factor for mobility at this point. She specifically denies given or selling any of her pain medications to others. Reports she does take dose consistently on regular schedule but rarely takes any extra even if pain flares up.   Allergies  Allergen Reactions  . Codeine Anaphylaxis  . Crestor [Rosuvastatin] Anaphylaxis  . Other Anaphylaxis  . Penicillins Anaphylaxis, Rash and Other (See Comments)    Reaction:  Tongue swelling  Has patient had a PCN reaction causing immediate rash, facial/tongue/throat swelling, SOB or lightheadedness with hypotension:  Yes    Has patient had a PCN reaction causing severe rash involving mucus membranes or skin necrosis: No Has patient had a PCN reaction that required hospitalization No Has patient had a PCN reaction occurring within the last 10 years: No If all of the above answers are "NO", then may proceed with Cephalosporin use.  . [place] Venom [Bee Venom] Anaphylaxis  . Gemfibrozil Rash, Nausea And Vomiting and Swelling  . Statins Nausea And Vomiting and Swelling  . Trazodone And Nefazodone Nausea And Vomiting  . Lipitor [Atorvastatin] Rash     Current Outpatient Medications:  .  albuterol (PROVENTIL) (2.5 MG/3ML) 0.083% nebulizer solution, Take 3 mLs (2.5 mg total) by nebulization every 6 (six) hours as needed for wheezing or shortness of breath., Disp: 75 mL, Rfl: 3 .  aspirin 81 MG chewable tablet, Chew 1 tablet (81 mg total) by mouth daily., Disp: 30 tablet, Rfl: 0 .  benzonatate (TESSALON PERLES) 100 MG capsule, Take 1 capsule (100 mg total) by mouth 3 (three) times daily as needed for cough., Disp: 60 capsule, Rfl: 3 .  clopidogrel (PLAVIX) 75 MG tablet, Take 1 tablet (75 mg total) by mouth daily., Disp: 30 tablet, Rfl: 3 .  ezetimibe (ZETIA) 10 MG tablet, Take 1 tablet (10 mg total) by mouth daily., Disp: 30 tablet, Rfl: 12 .  gabapentin (NEURONTIN) 300 MG capsule, Take 1 capsule (300 mg total) by mouth 3 (three) times daily., Disp: 90 capsule, Rfl: 5 .  glucose blood test strip, Contour test strip. Use to check blood sugar once a day  for for type 2 diabetes (E11.9), Disp: 100 each, Rfl: 4 .  Lancets MISC, Use to check blood sugar once daily for type 2 diabetes E11.9, Disp: 100 each, Rfl: 4 .  metFORMIN (GLUCOPHAGE XR) 500 MG 24 hr tablet, Take 2 tablets (1,000 mg total) by mouth daily with breakfast. With meals (Patient taking differently: Take 500 mg by mouth 2 (two) times daily with a meal. With meals), Disp: 60 tablet, Rfl: 3 .  montelukast (SINGULAIR) 10 MG tablet, Take 1 tablet (10 mg total)  by mouth at bedtime., Disp: 90 tablet, Rfl: 3 .  NARCAN 4 MG/0.1ML LIQD nasal spray kit, Place 0.4 mg into the nose once. , Disp: , Rfl: 99 .  oxyCODONE-acetaminophen (PERCOCET) 10-325 MG tablet, One tablet every 4-6 hours as needed., Disp: 180 tablet, Rfl: 0 .  PROAIR HFA 108 (90 Base) MCG/ACT inhaler, INHALE 2 PUFFS BY MOUTH EVERY 6 HOURS AS NEEDED FOR WHEEZING OR SHORTNESS OF BREATH, Disp: , Rfl: 3 .  ramelteon (ROZEREM) 8 MG tablet, Take 1 tablet (8 mg total) by mouth at bedtime., Disp: 30 tablet, Rfl: 3 .  umeclidinium-vilanterol (ANORO ELLIPTA) 62.5-25 MCG/INH AEPB, Inhale 1 puff into the lungs daily., Disp: 30 each, Rfl: 12 .  zaleplon (SONATA) 10 MG capsule, TAKE 1 CAPSULE BY MOUTH IN THE EVENING. MAY TAKE SECOND CAPSULE AFTER 2 HOURS IF NEEDED, Disp: 30 capsule, Rfl: 3 .  clonazePAM (KLONOPIN) 0.5 MG tablet, Take 1 tablet (0.5 mg total) by mouth 2 (two) times daily for 15 days., Disp: 30 tablet, Rfl: 0 .  guaiFENesin (MUCINEX) 600 MG 12 hr tablet, Take 1 tablet (600 mg total) by mouth 2 (two) times daily. (Patient not taking: Reported on 08/03/2018), Disp: , Rfl:  .  meloxicam (MOBIC) 15 MG tablet, TAKE 1 TABLET BY MOUTH ONCE DAILY AS NEEDED FOR PAIN (Patient not taking: No sig reported), Disp: 90 tablet, Rfl: 3  Review of Systems  Constitutional: Negative.   Respiratory: Negative.   Cardiovascular: Negative.   Gastrointestinal: Positive for vomiting. Negative for abdominal distention, abdominal pain, anal bleeding, blood in stool, constipation, diarrhea, nausea and rectal pain.  Musculoskeletal: Positive for arthralgias, back pain and gait problem.  Neurological: Positive for weakness. Negative for dizziness, light-headedness and headaches.    Social History   Tobacco Use  . Smoking status: Former Smoker    Packs/day: 0.25    Years: 45.00    Pack years: 11.25    Types: Cigarettes    Quit date: 03/17/2016    Years since quitting: 2.3  . Smokeless tobacco: Never Used  . Tobacco  comment: pt states she quit in 2016-2017  Substance Use Topics  . Alcohol use: No    Alcohol/week: 0.0 standard drinks      Objective:   BP 112/72 (BP Location: Right Arm, Patient Position: Sitting, Cuff Size: Large)   Pulse 88   Temp 98.1 F (36.7 C) (Oral)   Resp 20   Wt 172 lb (78 kg)   BMI 36.58 kg/m  Vitals:   08/03/18 0904  BP: 112/72  Pulse: 88  Resp: 20  Temp: 98.1 F (36.7 C)  TempSrc: Oral  Weight: 172 lb (78 kg)     Physical Exam   General Appearance:    Alert, cooperative, no distress  Eyes:    PERRL, conjunctiva/corneas clear, EOM's intact       Lungs:     Clear to auscultation bilaterally, respirations unlabored  Heart:    Regular rate and rhythm  Neurologic:   Awake, alert, oriented x 3. +4 muscle strength on right, +5 on left.  Able to stand and take 5-6 steps without assistance.   MS:   Tender along lower thoracic and lumbar spine with no radiation.     POCT UA - Microalbumin  Result Value Ref Range   Microalbumin Ur, POC 50 mg/L      Assessment & Plan    1. Type 2 diabetes mellitus with microalbuminuria (HCC) Hold off on ARB for now due to recent CVA and relative low BP. Consider adding at follow up.   2. Cerebellar infarct ([place]) In light of new right occipital CVA on CT from 07-21-2018, will continue dual antiplatelet therapy for now. She was off of ECASA at that time. She previously report anaphylactic reaction to rosuvastatin and rash on atorvastatin. She is to continue ezetimibe. Discussed option of adding Repatha for stroke risk reduction but she does not want to start injectable medication at this time.  Will refer neurology to follow up and discuss secondary stroke risk prevention.   3. Displacement of lumbar intervertebral disc without myelopathy   4. Chronic thoracic back pain, unspecified back pain laterality Daily pain is reasonably well controlled.   5. Chronic, continuous use of opioids Counseled patient regarding keeping  medication in secure location and to never give or sell them to others. Advised that she need to bring medication bottle with her to her next appointment for a pill count.  - [facility] ([facility]), Ur     Lelon Huh, MD  [facility]

## 2018-08-03 NOTE — Patient Instructions (Signed)
.   Please review the attached list of medications and notify my office if there are any errors.   . Please bring all of your medications to every appointment so we can make sure that our medication list is the same as yours.   . We will have flu vaccines available after Labor Day. Please go to your pharmacy or call the office in early September to schedule you flu shot.   Repatha Is an injectable medication you take once every 2 weeks to lower cholesterol and prevent strokes. You should consider taking this medication

## 2018-08-04 DIAGNOSIS — J439 Emphysema, unspecified: Secondary | ICD-10-CM | POA: Diagnosis not present

## 2018-08-04 DIAGNOSIS — F411 Generalized anxiety disorder: Secondary | ICD-10-CM | POA: Diagnosis not present

## 2018-08-04 DIAGNOSIS — I69393 Ataxia following cerebral infarction: Secondary | ICD-10-CM | POA: Diagnosis not present

## 2018-08-04 DIAGNOSIS — M5442 Lumbago with sciatica, left side: Secondary | ICD-10-CM | POA: Diagnosis not present

## 2018-08-04 DIAGNOSIS — I10 Essential (primary) hypertension: Secondary | ICD-10-CM | POA: Diagnosis not present

## 2018-08-04 DIAGNOSIS — E1140 Type 2 diabetes mellitus with diabetic neuropathy, unspecified: Secondary | ICD-10-CM | POA: Diagnosis not present

## 2018-08-05 DIAGNOSIS — F1190 Opioid use, unspecified, uncomplicated: Secondary | ICD-10-CM | POA: Insufficient documentation

## 2018-08-05 DIAGNOSIS — I69393 Ataxia following cerebral infarction: Secondary | ICD-10-CM | POA: Diagnosis not present

## 2018-08-05 DIAGNOSIS — E1140 Type 2 diabetes mellitus with diabetic neuropathy, unspecified: Secondary | ICD-10-CM | POA: Diagnosis not present

## 2018-08-05 DIAGNOSIS — J439 Emphysema, unspecified: Secondary | ICD-10-CM | POA: Diagnosis not present

## 2018-08-05 DIAGNOSIS — I10 Essential (primary) hypertension: Secondary | ICD-10-CM | POA: Diagnosis not present

## 2018-08-05 DIAGNOSIS — F411 Generalized anxiety disorder: Secondary | ICD-10-CM | POA: Diagnosis not present

## 2018-08-05 DIAGNOSIS — M5442 Lumbago with sciatica, left side: Secondary | ICD-10-CM | POA: Diagnosis not present

## 2018-08-05 LAB — PAIN MGT SCRN (14 DRUGS), UR
Amphetamine Scrn, Ur: NEGATIVE ng/mL
BARBITURATE SCREEN URINE: NEGATIVE ng/mL
BENZODIAZEPINE SCREEN, URINE: NEGATIVE ng/mL
Buprenorphine, Urine: NEGATIVE ng/mL
CANNABINOIDS UR QL SCN: NEGATIVE ng/mL
Cocaine (Metab) Scrn, Ur: NEGATIVE ng/mL
Creatinine(Crt), U: 94.2 mg/dL (ref 20.0–300.0)
Fentanyl, Urine: NEGATIVE pg/mL
Meperidine Screen, Urine: NEGATIVE ng/mL
Methadone Screen, Urine: NEGATIVE ng/mL
OXYCODONE+OXYMORPHONE UR QL SCN: POSITIVE ng/mL — AB
Opiate Scrn, Ur: POSITIVE ng/mL — AB
Ph of Urine: 5.8 (ref 4.5–8.9)
Phencyclidine Qn, Ur: NEGATIVE ng/mL
Propoxyphene Scrn, Ur: NEGATIVE ng/mL
Tramadol Screen, Urine: NEGATIVE ng/mL

## 2018-08-07 DIAGNOSIS — M5442 Lumbago with sciatica, left side: Secondary | ICD-10-CM | POA: Diagnosis not present

## 2018-08-07 DIAGNOSIS — F411 Generalized anxiety disorder: Secondary | ICD-10-CM | POA: Diagnosis not present

## 2018-08-07 DIAGNOSIS — E1140 Type 2 diabetes mellitus with diabetic neuropathy, unspecified: Secondary | ICD-10-CM | POA: Diagnosis not present

## 2018-08-07 DIAGNOSIS — J439 Emphysema, unspecified: Secondary | ICD-10-CM | POA: Diagnosis not present

## 2018-08-07 DIAGNOSIS — I10 Essential (primary) hypertension: Secondary | ICD-10-CM | POA: Diagnosis not present

## 2018-08-07 DIAGNOSIS — I69393 Ataxia following cerebral infarction: Secondary | ICD-10-CM | POA: Diagnosis not present

## 2018-08-10 ENCOUNTER — Telehealth: Payer: Self-pay

## 2018-08-10 ENCOUNTER — Encounter: Payer: Self-pay | Admitting: *Deleted

## 2018-08-10 ENCOUNTER — Ambulatory Visit: Payer: Self-pay | Admitting: *Deleted

## 2018-08-10 DIAGNOSIS — F411 Generalized anxiety disorder: Secondary | ICD-10-CM | POA: Diagnosis not present

## 2018-08-10 DIAGNOSIS — M5442 Lumbago with sciatica, left side: Secondary | ICD-10-CM | POA: Diagnosis not present

## 2018-08-10 DIAGNOSIS — I10 Essential (primary) hypertension: Secondary | ICD-10-CM | POA: Diagnosis not present

## 2018-08-10 DIAGNOSIS — J439 Emphysema, unspecified: Secondary | ICD-10-CM | POA: Diagnosis not present

## 2018-08-10 DIAGNOSIS — E1140 Type 2 diabetes mellitus with diabetic neuropathy, unspecified: Secondary | ICD-10-CM | POA: Diagnosis not present

## 2018-08-10 DIAGNOSIS — G8929 Other chronic pain: Secondary | ICD-10-CM

## 2018-08-10 DIAGNOSIS — I69393 Ataxia following cerebral infarction: Secondary | ICD-10-CM | POA: Diagnosis not present

## 2018-08-10 DIAGNOSIS — R531 Weakness: Secondary | ICD-10-CM

## 2018-08-10 NOTE — Patient Instructions (Signed)
Thank you allowing the Chronic Care Management Team to be a part of your care! It was a pleasure speaking with you today!  1. Please call this social worker with any questions or concerns regarding your level of care needs.     CCM (Chronic Care Management) Team   Trish Fountain RN, BSN Nurse Care Coordinator  [phone]  Ruben Reason PharmD  Clinical Pharmacist  [phone]   Elliot Gurney, LCSW Clinical Social Worker [phone]  Goals Addressed            This Visit's Progress   . 'I am old and warn out and need help finding a place to go" (pt-stated)       Current Barriers:  . Level of care concerns  Clinical Social Work Clinical Goal(s):  [name] Over the next 90 days, client will work with SW to address concerns related to level of care concerns  Interventions: . Provided patient with information about CCM services . Discussed plans with patient for ongoing care management follow up and provided patient with direct contact information for care management team . Provided brief education to patient regarding level of care options, phone call dropped and this social worker was unable to reach patient by phone again.  Patient Self Care Activities:  . Performs ADL's independently . Performs IADL's independently  Initial goal documentation     . Patient Stated (pt-stated)          The patient verbalized understanding of instructions provided today and declined a print copy of patient instruction materials.   Telephone follow up appointment with care management team member scheduled for:08/10/18 at 12:30pm

## 2018-08-10 NOTE — Chronic Care Management (AMB) (Signed)
[name]  Chronic Care Management    Clinical Social Work General Note  08/10/2018 Name: Yvette Riley MRN: [id number] DOB: Feb 27, 1955  Yvette Riley is a 63 y.o. year old female who is a primary care patient of Fisher, Kirstie Peri, MD. The CCM was consulted to assist the patient with Level of Care Concerns.   Yvette Riley was given information about Chronic Care Management services today including:  1. CCM service includes personalized support from designated clinical staff supervised by her physician, including individualized plan of care and coordination with other care providers 2. 24/7 contact phone numbers for assistance for urgent and routine care needs. 3. Service will only be billed when office clinical staff spend 20 minutes or more in a month to coordinate care. 4. Only one practitioner may furnish and bill the service in a calendar month. 5. The patient may stop CCM services at any time (effective at the end of the month) by phone call to the office staff. 6. The patient will be responsible for cost sharing (co-pay) of up to 20% of the service fee (after annual deductible is met).  Patient agreed to services and verbal consent obtained.   Review of patient status, including review of consultants reports, relevant laboratory and other test results, and collaboration with appropriate care team members and the patient's provider was performed as part of comprehensive patient evaluation and provision of chronic care management services.    SDOH (Social Determinants of Health) screening performed today. See Care Plan Entry related to challenges with: Depression   Tobacco Use Stress Physical Activity  Goals Addressed            This Visit's Progress   . 'I am old and warn out and need help finding a place to go" (pt-stated)       Phone call to patient today to introduce  CCM services and to schedule appointment to complete full assessment. Per patient, she has limited family support  and would like to transition to a nursing home. Patient confirmed that she did not have Medicaid and that she had Medicare insurance coverage only. This social worker was able to introduce CCM services, and discuss her needs briefly before the call was disconnected.  This Education officer, museum tried returning the call multiple times but could not get patient back on the phone. This social worker was able to make an appointment to follow up wit patient on 08/11/18 at 12:30pm.  Current Barriers:  . Level of care concerns  Clinical Social Work Clinical Goal(s):  [name] Over the next 90 days, client will work with SW to address concerns related to level of care concerns  Interventions: . Provided patient with information about CCM services . Discussed plans with patient for ongoing care management follow up and provided patient with direct contact information for care management team . Provided brief education to patient regarding level of care options, phone call dropped and this social worker was unable to reach patient by phone again.  Patient Self Care Activities:  . Performs ADL's independently . Performs IADL's independently  Initial goal documentation     . Patient Stated (pt-stated)          Follow Up Plan: Appointment scheduled for SW follow up with client by phone on: 08/11/18        Elliot Gurney, [place] Worker  [facility] [phone]

## 2018-08-10 NOTE — Telephone Encounter (Signed)
Returned patient's call. Patient has decided she would like to live in an assisted living facility or in a "home", and she is unable to care for herself anymore.   Will forward to [facility], LCSW to set up. Confirmed with patient that [facility] already had patients scheduled today and will need to schedule a full appointment for another date. Patient verbalized understanding.   Ruben Reason, PharmD Clinical Pharmacist [place] [phone]

## 2018-08-10 NOTE — Telephone Encounter (Signed)
Patient requesting to talk to one of our CCM member.

## 2018-08-11 ENCOUNTER — Ambulatory Visit: Payer: Medicare Other | Admitting: *Deleted

## 2018-08-11 ENCOUNTER — Telehealth: Payer: Self-pay | Admitting: Family Medicine

## 2018-08-11 DIAGNOSIS — M25561 Pain in right knee: Secondary | ICD-10-CM

## 2018-08-11 DIAGNOSIS — I10 Essential (primary) hypertension: Secondary | ICD-10-CM | POA: Diagnosis not present

## 2018-08-11 DIAGNOSIS — J439 Emphysema, unspecified: Secondary | ICD-10-CM | POA: Diagnosis not present

## 2018-08-11 DIAGNOSIS — M5442 Lumbago with sciatica, left side: Secondary | ICD-10-CM | POA: Diagnosis not present

## 2018-08-11 DIAGNOSIS — G8929 Other chronic pain: Secondary | ICD-10-CM

## 2018-08-11 DIAGNOSIS — F411 Generalized anxiety disorder: Secondary | ICD-10-CM | POA: Diagnosis not present

## 2018-08-11 DIAGNOSIS — R531 Weakness: Secondary | ICD-10-CM

## 2018-08-11 DIAGNOSIS — E1140 Type 2 diabetes mellitus with diabetic neuropathy, unspecified: Secondary | ICD-10-CM | POA: Diagnosis not present

## 2018-08-11 DIAGNOSIS — I69393 Ataxia following cerebral infarction: Secondary | ICD-10-CM | POA: Diagnosis not present

## 2018-08-11 NOTE — Telephone Encounter (Signed)
Pt called asking if Almyra Free could call her back regarding her phone conversation yesterday about being in an assisted living place.  I tried to contact Almyra Free but I did not get an answer or voice mail  teri

## 2018-08-11 NOTE — Telephone Encounter (Signed)
Patient has a call with [facility] scheduled for 12:30 about her living situation. Unfortunately this is not a pharmacy topic and I have transitioned patient appropriately. As patient already has an appointment scheduled with appropriate clinician today, I feel confident that [facility] will address her needs.  I am unable to answer housing questions as I am a clinically trained pharmacist. I am happy to assist the patient with her medications if she has needs identified in this area.   Ruben Reason, PharmD Clinical Pharmacist [place] [phone]

## 2018-08-12 ENCOUNTER — Telehealth: Payer: Self-pay

## 2018-08-12 DIAGNOSIS — I69393 Ataxia following cerebral infarction: Secondary | ICD-10-CM | POA: Diagnosis not present

## 2018-08-12 DIAGNOSIS — E1140 Type 2 diabetes mellitus with diabetic neuropathy, unspecified: Secondary | ICD-10-CM | POA: Diagnosis not present

## 2018-08-12 DIAGNOSIS — F411 Generalized anxiety disorder: Secondary | ICD-10-CM | POA: Diagnosis not present

## 2018-08-12 DIAGNOSIS — I10 Essential (primary) hypertension: Secondary | ICD-10-CM | POA: Diagnosis not present

## 2018-08-12 DIAGNOSIS — M5442 Lumbago with sciatica, left side: Secondary | ICD-10-CM | POA: Diagnosis not present

## 2018-08-12 DIAGNOSIS — J439 Emphysema, unspecified: Secondary | ICD-10-CM | POA: Diagnosis not present

## 2018-08-12 NOTE — Telephone Encounter (Signed)
Pt was checking to see if the forms from the [place] have been completed yet.   Please call when they are ready to be picked up.  [phone]   Thanks,   -Mickel Baas

## 2018-08-12 NOTE — Chronic Care Management (AMB) (Signed)
  Chronic Care Management    Clinical Social Work Follow Up Note  08/12/2018 Name: Yvette Riley MRN: [id number] DOB: July 13, 1955  Yvette Riley is a 63 y.o. year old female who is a primary care patient of Fisher, Kirstie Peri, MD. The CCM team was consulted for assistance with Level of Care Concerns.   Review of patient status, including review of consultants reports, other relevant assessments, and collaboration with appropriate care team members and the patient's provider was performed as part of comprehensive patient evaluation and provision of chronic care management services.     Goals Addressed            This Visit's Progress   . 'I am old and warn out and need help finding a place to go" (pt-stated)       Current Barriers:  . Level of care concerns  Clinical Social Work Clinical Goal(s):  [name] Over the next 90 days, client will work with SW to address concerns related to level of care concerns  Interventions: . Continued to explore patient's level of care needs . Discussed patient's current network of support and explored safety concerns . Discussed placement process and need for long term care medicaid to be in place for payment purposes . Discussed plans with patient for ongoing care management follow up and provided patient with direct contact information for care management team   Patient Self Care Activities:  . Performs ADL's independently . Performs IADL's independently  Please see past updates related to this goal by clicking on the "Past Updates" button in the selected goal          Follow Up Plan: SW will follow up with patient by phone over the next 2 weeks    [place], [place] Worker  [facility] [phone]

## 2018-08-12 NOTE — Patient Instructions (Signed)
Thank you allowing the Chronic Care Management Team to be a part of your care! It was a pleasure speaking with you today!  1. Your CCM social worker will work to assist you in your transition to a higher level of care. 2. Please call this social worker if there are any questions or concerns regarding your level of care needs.  CCM (Chronic Care Management) Team   Trish Fountain RN, BSN Nurse Care Coordinator  [phone]  Ruben Reason PharmD  Clinical Pharmacist  [phone]   Elliot Gurney, LCSW Clinical Social Worker [phone]  Goals Addressed            This Visit's Progress   . 'I am old and warn out and need help finding a place to go" (pt-stated)       Current Barriers:  . Level of care concerns  Clinical Social Work Clinical Goal(s):  [name] Over the next 90 days, client will work with SW to address concerns related to level of care concerns  Interventions: . Continued to explore patient's level of care needs . Discussed patient's current network of support and explored safety concerns . Discussed placement process and need for long term care medicaid to be in place for payment purposes . Discussed plans with patient for ongoing care management follow up and provided patient with direct contact information for care management team   Patient Self Care Activities:  . Performs ADL's independently . Performs IADL's independently  Please see past updates related to this goal by clicking on the "Past Updates" button in the selected goal          The patient verbalized understanding of instructions provided today and declined a print copy of patient instruction materials.   The care management team will reach out to the patient again over the next 14 days.

## 2018-08-12 NOTE — Telephone Encounter (Signed)
FYI  Staci Righter from [facility],  Patient has had 3 falls-increased back pain of 9/10  Social worker scheduled to discuss with her today about possible placement or change in living arrangements

## 2018-08-17 ENCOUNTER — Ambulatory Visit: Payer: Medicare Other | Admitting: *Deleted

## 2018-08-17 DIAGNOSIS — F419 Anxiety disorder, unspecified: Secondary | ICD-10-CM

## 2018-08-17 DIAGNOSIS — R531 Weakness: Secondary | ICD-10-CM

## 2018-08-17 NOTE — Chronic Care Management (AMB) (Signed)
  Chronic Care Management    Clinical Social Work Follow Up Note  08/17/2018 Name: Yvette Riley MRN: [id number] DOB: 01-04-1956  Yvette Riley is a 63 y.o. year old female who is a primary care patient of Fisher, Kirstie Peri, MD. The CCM team was consulted for assistance with Level of Care Concerns.   Review of patient status, including review of consultants reports, other relevant assessments, and collaboration with appropriate care team members and the patient's provider was performed as part of comprehensive patient evaluation and provision of chronic care management services.     Goals Addressed            This Visit's Progress   . 'I am old and warn out and need help finding a place to go" (pt-stated)       Current Barriers:  . Level of care concerns  Clinical Social Work Clinical Goal(s):  [name] Over the next 90 days, client will work with SW to address concerns related to level of care concerns  Interventions: . Continued to explore patient's level of care needs . Confirmed that patient has now decided against facility care placement at this time . Contacted patient's provider to cancel request for the FL2 . Explored patient's current functioning and confirmed with patient her improved ability to take care of her own ADL's and IADL's with moderate assistance . Discussed with patient available network of support and confirmed that patient has hired a Actuary 3 days a week. She also discussed having a friend that will check on her regularly. . Discussed plans with patient for ongoing care management follow up if needed in the future and provided patient with direct contact information for care management team   Patient Self Care Activities:  . Performs ADL's independently . Performs IADL's independently  Please see past updates related to this goal by clicking on the "Past Updates" button in the selected goal          Follow Up Plan: Client will contact patient with any  community resource needs in the future    [place], [place] Worker  [place] [facility] Management [phone]

## 2018-08-17 NOTE — Patient Instructions (Signed)
Thank you allowing the Chronic Care Management Team to be a part of your care! It was a pleasure speaking with you today!  1. Please call this social worker if our level of care need changes.  CCM (Chronic Care Management) Team   Trish Fountain RN, BSN Nurse Care Coordinator  [phone]  Ruben Reason PharmD  Clinical Pharmacist  [phone]   Elliot Gurney, LCSW Clinical Social Worker [phone]  Goals Addressed            This Visit's Progress   . 'I am old and warn out and need help finding a place to go" (pt-stated)       Current Barriers:  . Level of care concerns  Clinical Social Work Clinical Goal(s):  [name] Over the next 90 days, client will work with SW to address concerns related to level of care concerns  Interventions: . Continued to explore patient's level of care needs . Confirmed that patient has now decided against facility care placement at this time . Contacted patient's provider to cancel request for the FL2 . Explored patient's current functioning and confirmed with patient her improved ability to take care of her own ADL's and IADL's with moderate assistance . Discussed with patient available network of support and confirmed that patient has hired a Actuary 3 days a week. She also discussed having a friend that will check on her regularly. . Discussed plans with patient for ongoing care management follow up if needed in the future and provided patient with direct contact information for care management team   Patient Self Care Activities:  . Performs ADL's independently . Performs IADL's independently  Please see past updates related to this goal by clicking on the "Past Updates" button in the selected goal          The patient verbalized understanding of instructions provided today and declined a print copy of patient instruction materials.   No further follow up required: patient has decided against facility care at this time. Patient to  call this social worker if  her level of care need changes

## 2018-08-18 ENCOUNTER — Other Ambulatory Visit: Payer: Self-pay | Admitting: Family Medicine

## 2018-08-18 ENCOUNTER — Telehealth: Payer: Self-pay | Admitting: Family Medicine

## 2018-08-18 DIAGNOSIS — J439 Emphysema, unspecified: Secondary | ICD-10-CM | POA: Diagnosis not present

## 2018-08-18 DIAGNOSIS — I10 Essential (primary) hypertension: Secondary | ICD-10-CM | POA: Diagnosis not present

## 2018-08-18 DIAGNOSIS — M5442 Lumbago with sciatica, left side: Secondary | ICD-10-CM | POA: Diagnosis not present

## 2018-08-18 DIAGNOSIS — E1140 Type 2 diabetes mellitus with diabetic neuropathy, unspecified: Secondary | ICD-10-CM | POA: Diagnosis not present

## 2018-08-18 DIAGNOSIS — F411 Generalized anxiety disorder: Secondary | ICD-10-CM | POA: Diagnosis not present

## 2018-08-18 DIAGNOSIS — I69393 Ataxia following cerebral infarction: Secondary | ICD-10-CM | POA: Diagnosis not present

## 2018-08-18 MED ORDER — SUCRALFATE 1 G PO TABS: 1.0000 g | ORAL_TABLET | Freq: Three times a day (TID) | ORAL | 4 refills | Status: DC

## 2018-08-18 NOTE — Telephone Encounter (Signed)
Please review, I do not see this on her medication list

## 2018-08-18 NOTE — Telephone Encounter (Signed)
Pt needs a refill on generic Carafate  [facility] [address]  CB#  [phone]  [place]

## 2018-08-19 ENCOUNTER — Telehealth: Payer: Self-pay | Admitting: Family Medicine

## 2018-08-19 NOTE — Telephone Encounter (Signed)
°  Pt wants to know why her gabapentin (NEURONTIN) 300 MG capsule was decreased to 3 times a day.  Please call pt back to discuss asap.  Thanks, [employer]

## 2018-08-19 NOTE — Telephone Encounter (Signed)
Pt called saying her pharmacy told her that Dr. Caryn Section decreased her gabapentin to two tablets and day and she was taking three.  She says that she needs the three for pain,  CB#  [phone]  [place]

## 2018-08-20 DIAGNOSIS — F411 Generalized anxiety disorder: Secondary | ICD-10-CM | POA: Diagnosis not present

## 2018-08-20 DIAGNOSIS — I69393 Ataxia following cerebral infarction: Secondary | ICD-10-CM | POA: Diagnosis not present

## 2018-08-20 DIAGNOSIS — E1140 Type 2 diabetes mellitus with diabetic neuropathy, unspecified: Secondary | ICD-10-CM | POA: Diagnosis not present

## 2018-08-20 DIAGNOSIS — M5442 Lumbago with sciatica, left side: Secondary | ICD-10-CM | POA: Diagnosis not present

## 2018-08-20 DIAGNOSIS — I10 Essential (primary) hypertension: Secondary | ICD-10-CM | POA: Diagnosis not present

## 2018-08-20 DIAGNOSIS — J439 Emphysema, unspecified: Secondary | ICD-10-CM | POA: Diagnosis not present

## 2018-08-20 MED ORDER — GABAPENTIN 300 MG PO CAPS: 300.0000 mg | ORAL_CAPSULE | Freq: Every day | ORAL | 5 refills | Status: DC

## 2018-08-20 NOTE — Telephone Encounter (Signed)
I have no idea what she is talking about. The last prescription before today was on 7-1 and written for three a day. She had been on 5 tablets a day up until May when it was decreased to three a day. I sent a new prescription today going back up to 5 a day, but she shouldn't take any more than she really needs because it is a little sedating and can cause depression.

## 2018-08-20 NOTE — Telephone Encounter (Signed)
LMTCB 08/20/2018     Pt also has [facility] forms that are ready to be picked up.  They are on my desk.    Thanks,   -Mickel Baas

## 2018-08-20 NOTE — Telephone Encounter (Signed)
Tried calling patient. Left message to call back. 

## 2018-08-20 NOTE — Telephone Encounter (Signed)
Have sent refill to allow her to take 5 times daily

## 2018-08-21 ENCOUNTER — Other Ambulatory Visit: Payer: Self-pay | Admitting: Family Medicine

## 2018-08-21 DIAGNOSIS — M5442 Lumbago with sciatica, left side: Secondary | ICD-10-CM

## 2018-08-21 DIAGNOSIS — G8929 Other chronic pain: Secondary | ICD-10-CM

## 2018-08-21 NOTE — Telephone Encounter (Signed)
Pt stated that she is at [facility] trying to pick up her gabapentin (NEURONTIN) 300 MG capsule and they advised her she can not pick up the medication until Sunday 08/23/2018. Pt stated she is going to run out of the medication Saturday 08/22/2018. Pt is requesting that we contact [facility] and advise that the medication can be filled early. Please advise. Thanks TNP

## 2018-08-21 NOTE — Telephone Encounter (Signed)
1. Pt contacted office for refill request on the following medications:  oxyCODONE-acetaminophen (PERCOCET) 10-325 MG tablet  [facility] [address]  Pt stated that the medication is due 08/31/2018 but that Dr. Caryn Section has told her she needs to call at least 1 week before due date to start requesting refill.  2. Pt called back stating that she picked up her zaleplon (SONATA) 10 MG capsule today and the pharmacy has her directions as follows:  Take 1 capsule in the morning and another in the afternoon.  Pt stated that she thinks she is going to change pharmacy and wanted Dr. Caryn Section to know what they had but on her bottle. Pt stated that she knows she is supposed to take the medication 1 at night and another 2 hours later if needed.  Please advise. Thanks TNP

## 2018-08-21 NOTE — Telephone Encounter (Signed)
I spoke with pharmacist and she reports that patient just picked up prescription. I called patient and confirmed that she had her prescription. I also advised patient that her [employer] forms are ready for pick up.

## 2018-08-24 DIAGNOSIS — I10 Essential (primary) hypertension: Secondary | ICD-10-CM | POA: Diagnosis not present

## 2018-08-24 DIAGNOSIS — J439 Emphysema, unspecified: Secondary | ICD-10-CM | POA: Diagnosis not present

## 2018-08-24 DIAGNOSIS — E1140 Type 2 diabetes mellitus with diabetic neuropathy, unspecified: Secondary | ICD-10-CM | POA: Diagnosis not present

## 2018-08-24 DIAGNOSIS — I69393 Ataxia following cerebral infarction: Secondary | ICD-10-CM | POA: Diagnosis not present

## 2018-08-24 DIAGNOSIS — I663 Occlusion and stenosis of cerebellar arteries: Secondary | ICD-10-CM | POA: Diagnosis not present

## 2018-08-24 DIAGNOSIS — M5442 Lumbago with sciatica, left side: Secondary | ICD-10-CM | POA: Diagnosis not present

## 2018-08-24 DIAGNOSIS — F0631 Mood disorder due to known physiological condition with depressive features: Secondary | ICD-10-CM | POA: Diagnosis not present

## 2018-08-24 DIAGNOSIS — G4733 Obstructive sleep apnea (adult) (pediatric): Secondary | ICD-10-CM | POA: Diagnosis not present

## 2018-08-24 DIAGNOSIS — I69398 Other sequelae of cerebral infarction: Secondary | ICD-10-CM | POA: Diagnosis not present

## 2018-08-24 DIAGNOSIS — F411 Generalized anxiety disorder: Secondary | ICD-10-CM | POA: Diagnosis not present

## 2018-08-25 ENCOUNTER — Telehealth: Payer: Self-pay | Admitting: Family Medicine

## 2018-08-25 DIAGNOSIS — F411 Generalized anxiety disorder: Secondary | ICD-10-CM | POA: Diagnosis not present

## 2018-08-25 DIAGNOSIS — E1140 Type 2 diabetes mellitus with diabetic neuropathy, unspecified: Secondary | ICD-10-CM | POA: Diagnosis not present

## 2018-08-25 DIAGNOSIS — K219 Gastro-esophageal reflux disease without esophagitis: Secondary | ICD-10-CM | POA: Diagnosis not present

## 2018-08-25 DIAGNOSIS — I10 Essential (primary) hypertension: Secondary | ICD-10-CM | POA: Diagnosis not present

## 2018-08-25 DIAGNOSIS — E669 Obesity, unspecified: Secondary | ICD-10-CM | POA: Diagnosis not present

## 2018-08-25 DIAGNOSIS — J439 Emphysema, unspecified: Secondary | ICD-10-CM | POA: Diagnosis not present

## 2018-08-25 DIAGNOSIS — M1990 Unspecified osteoarthritis, unspecified site: Secondary | ICD-10-CM | POA: Diagnosis not present

## 2018-08-25 DIAGNOSIS — M5442 Lumbago with sciatica, left side: Secondary | ICD-10-CM | POA: Diagnosis not present

## 2018-08-25 DIAGNOSIS — Z6836 Body mass index (BMI) 36.0-36.9, adult: Secondary | ICD-10-CM | POA: Diagnosis not present

## 2018-08-25 DIAGNOSIS — Z85118 Personal history of other malignant neoplasm of bronchus and lung: Secondary | ICD-10-CM | POA: Diagnosis not present

## 2018-08-25 DIAGNOSIS — J45998 Other asthma: Secondary | ICD-10-CM | POA: Diagnosis not present

## 2018-08-25 DIAGNOSIS — I69393 Ataxia following cerebral infarction: Secondary | ICD-10-CM | POA: Diagnosis not present

## 2018-08-25 DIAGNOSIS — Z9181 History of falling: Secondary | ICD-10-CM | POA: Diagnosis not present

## 2018-08-25 DIAGNOSIS — M5441 Lumbago with sciatica, right side: Secondary | ICD-10-CM | POA: Diagnosis not present

## 2018-08-25 DIAGNOSIS — Z87891 Personal history of nicotine dependence: Secondary | ICD-10-CM | POA: Diagnosis not present

## 2018-08-25 DIAGNOSIS — H409 Unspecified glaucoma: Secondary | ICD-10-CM | POA: Diagnosis not present

## 2018-08-25 DIAGNOSIS — Z7984 Long term (current) use of oral hypoglycemic drugs: Secondary | ICD-10-CM | POA: Diagnosis not present

## 2018-08-25 DIAGNOSIS — R1310 Dysphagia, unspecified: Secondary | ICD-10-CM | POA: Diagnosis not present

## 2018-08-25 NOTE — Telephone Encounter (Signed)
That's fine

## 2018-08-25 NOTE — Telephone Encounter (Signed)
FYI

## 2018-08-25 NOTE — Telephone Encounter (Signed)
Left message with verbal orders.    Thanks,   -Mickel Baas

## 2018-08-25 NOTE — Telephone Encounter (Signed)
Pt called saying she went to the neurology appt yesterday that she was referred to. by Dr. Caryn Section.  She said he changed a lot of her medications and put her on Zoloft.  She wants to discuss these changes with Dr. Caryn Section  [place]

## 2018-08-25 NOTE — Telephone Encounter (Signed)
We with [facility] called for verbal  PT orders 2 times a week for one week and once a week for three weeks  [phone]  You can leave a message   teri

## 2018-08-26 NOTE — Telephone Encounter (Signed)
Tried calling patient. Left message to call back. 

## 2018-08-26 NOTE — Telephone Encounter (Signed)
reviewed notes from Dr. Manuella Ghazi and agree, she should stop ECASA and continue clopidegreal, start sertraline that he prescribed.

## 2018-08-27 ENCOUNTER — Other Ambulatory Visit: Payer: Self-pay | Admitting: Family Medicine

## 2018-08-27 DIAGNOSIS — G8929 Other chronic pain: Secondary | ICD-10-CM

## 2018-08-27 NOTE — Telephone Encounter (Signed)
Patient advised.

## 2018-08-27 NOTE — Telephone Encounter (Signed)
Pt needs a refill on her oxycodone 10-325 on Aug 5th  [facility] [address]  Thanks  teri

## 2018-08-29 MED ORDER — OXYCODONE-ACETAMINOPHEN 10-325 MG PO TABS: ORAL_TABLET | ORAL | 0 refills | Status: DC

## 2018-08-31 ENCOUNTER — Other Ambulatory Visit: Payer: Self-pay | Admitting: Family Medicine

## 2018-08-31 DIAGNOSIS — I69393 Ataxia following cerebral infarction: Secondary | ICD-10-CM | POA: Diagnosis not present

## 2018-08-31 DIAGNOSIS — E1140 Type 2 diabetes mellitus with diabetic neuropathy, unspecified: Secondary | ICD-10-CM | POA: Diagnosis not present

## 2018-08-31 DIAGNOSIS — I10 Essential (primary) hypertension: Secondary | ICD-10-CM | POA: Diagnosis not present

## 2018-08-31 DIAGNOSIS — M5442 Lumbago with sciatica, left side: Secondary | ICD-10-CM | POA: Diagnosis not present

## 2018-08-31 DIAGNOSIS — G8929 Other chronic pain: Secondary | ICD-10-CM

## 2018-08-31 DIAGNOSIS — F411 Generalized anxiety disorder: Secondary | ICD-10-CM | POA: Diagnosis not present

## 2018-08-31 DIAGNOSIS — I663 Occlusion and stenosis of cerebellar arteries: Secondary | ICD-10-CM | POA: Insufficient documentation

## 2018-08-31 DIAGNOSIS — J439 Emphysema, unspecified: Secondary | ICD-10-CM | POA: Diagnosis not present

## 2018-08-31 NOTE — Telephone Encounter (Signed)
Pt needing refills on:  oxyCODONE-acetaminophen (PERCOCET) 10-325 MG tablet  Pt is running out of sucralfate (CARAFATE) 1 g tablet.  Wanting to know if she needs to keep taking this?  Please fill at:  [facility] ([facility] [place] [facility] [place] [phone] (Phone) [phone] (Fax)     Pt keeps crying and getting to her while taking sertraline (ZOLOFT) 25 MG tablet - please advise.  Thanks, [employer]

## 2018-09-02 DIAGNOSIS — I10 Essential (primary) hypertension: Secondary | ICD-10-CM | POA: Diagnosis not present

## 2018-09-02 DIAGNOSIS — F411 Generalized anxiety disorder: Secondary | ICD-10-CM | POA: Diagnosis not present

## 2018-09-02 DIAGNOSIS — I69393 Ataxia following cerebral infarction: Secondary | ICD-10-CM | POA: Diagnosis not present

## 2018-09-02 DIAGNOSIS — E1140 Type 2 diabetes mellitus with diabetic neuropathy, unspecified: Secondary | ICD-10-CM | POA: Diagnosis not present

## 2018-09-02 DIAGNOSIS — J439 Emphysema, unspecified: Secondary | ICD-10-CM | POA: Diagnosis not present

## 2018-09-02 DIAGNOSIS — M5442 Lumbago with sciatica, left side: Secondary | ICD-10-CM | POA: Diagnosis not present

## 2018-09-08 DIAGNOSIS — M5442 Lumbago with sciatica, left side: Secondary | ICD-10-CM | POA: Diagnosis not present

## 2018-09-08 DIAGNOSIS — I6389 Other cerebral infarction: Secondary | ICD-10-CM | POA: Diagnosis not present

## 2018-09-08 DIAGNOSIS — F411 Generalized anxiety disorder: Secondary | ICD-10-CM | POA: Diagnosis not present

## 2018-09-08 DIAGNOSIS — I69393 Ataxia following cerebral infarction: Secondary | ICD-10-CM | POA: Diagnosis not present

## 2018-09-08 DIAGNOSIS — I2510 Atherosclerotic heart disease of native coronary artery without angina pectoris: Secondary | ICD-10-CM | POA: Diagnosis not present

## 2018-09-08 DIAGNOSIS — I10 Essential (primary) hypertension: Secondary | ICD-10-CM | POA: Diagnosis not present

## 2018-09-08 DIAGNOSIS — J439 Emphysema, unspecified: Secondary | ICD-10-CM | POA: Diagnosis not present

## 2018-09-08 DIAGNOSIS — E1140 Type 2 diabetes mellitus with diabetic neuropathy, unspecified: Secondary | ICD-10-CM | POA: Diagnosis not present

## 2018-09-08 DIAGNOSIS — E782 Mixed hyperlipidemia: Secondary | ICD-10-CM | POA: Diagnosis not present

## 2018-09-08 DIAGNOSIS — R0602 Shortness of breath: Secondary | ICD-10-CM | POA: Diagnosis not present

## 2018-09-10 ENCOUNTER — Ambulatory Visit
Admission: RE | Admit: 2018-09-10 | Discharge: 2018-09-10 | Disposition: A | Discharge status: Home / Self Care | Payer: Medicare Other | Source: Ambulatory Visit | Attending: Oncology | Admitting: Oncology

## 2018-09-10 ENCOUNTER — Other Ambulatory Visit: Payer: Self-pay

## 2018-09-10 ENCOUNTER — Inpatient Hospital Stay: Payer: Medicare Other | Attending: Oncology

## 2018-09-10 DIAGNOSIS — C3491 Malignant neoplasm of unspecified part of right bronchus or lung: Secondary | ICD-10-CM

## 2018-09-10 DIAGNOSIS — Z85118 Personal history of other malignant neoplasm of bronchus and lung: Secondary | ICD-10-CM | POA: Insufficient documentation

## 2018-09-10 DIAGNOSIS — I313 Pericardial effusion (noninflammatory): Secondary | ICD-10-CM | POA: Diagnosis not present

## 2018-09-10 DIAGNOSIS — J8410 Pulmonary fibrosis, unspecified: Secondary | ICD-10-CM | POA: Diagnosis not present

## 2018-09-10 DIAGNOSIS — M549 Dorsalgia, unspecified: Secondary | ICD-10-CM | POA: Diagnosis not present

## 2018-09-10 DIAGNOSIS — C3490 Malignant neoplasm of unspecified part of unspecified bronchus or lung: Secondary | ICD-10-CM | POA: Diagnosis not present

## 2018-09-10 DIAGNOSIS — R918 Other nonspecific abnormal finding of lung field: Secondary | ICD-10-CM | POA: Diagnosis not present

## 2018-09-10 LAB — CREATININE, SERUM
Creatinine, Ser: 0.46 mg/dL (ref 0.44–1.00)
GFR calc Af Amer: >60 mL/min (ref >60)
GFR calc non Af Amer: >60 mL/min (ref >60)

## 2018-09-10 MED ORDER — IOHEXOL 300 MG/ML  SOLN
75.0000 mL | Freq: Once | INTRAMUSCULAR | Status: AC | PRN
  Administered 2018-09-10: 11:00:00 75 mL via INTRAVENOUS

## 2018-09-10 NOTE — Progress Notes (Deleted)
[place]  [phone] [phone]  ID: Yvette Riley OB: [birth year]  MR#: [id number]  PRF#:[id number]  Patient Care Team: Birdie Sons, MD as PCP - General (Family Medicine) Lorelee Cover., MD (Ophthalmology) Benedetto Goad, RN as Case Manager  CHIEF COMPLAINT: Clinical stage IIIB small cell lung cancer of the right lung.  INTERVAL HISTORY: Patient returns to clinic today for further evaluation and discussion of her imaging results.  She continues to have chronic shortness of breath.  She also complains of increased weakness and fatigue.  She has chronic back pain. She has no neurologic complaints.  She denies any recent fevers or illnesses.  She has a good appetite and denies weight loss.  She denies any chest pain, cough, or hemoptysis.  She denies any nausea, vomiting, constipation, or diarrhea.  She denies any melena or hematochezia.  She has no urinary complaints.  Patient offers no further specific complaints today.  REVIEW OF SYSTEMS:   Review of Systems  Constitutional: Positive for malaise/fatigue. Negative for fever and weight loss.  Respiratory: Positive for shortness of breath. Negative for cough and hemoptysis.   Cardiovascular: Negative.  Negative for chest pain and leg swelling.  Gastrointestinal: Negative.  Negative for abdominal pain and constipation.  Genitourinary: Negative.  Negative for dysuria.  Musculoskeletal: Positive for back pain. Negative for myalgias.  Skin: Negative.  Negative for rash.  Neurological: Positive for weakness. Negative for sensory change, focal weakness and headaches.  Psychiatric/Behavioral: Negative.  The patient is not nervous/anxious.     As per HPI. Otherwise, a complete review of systems is negative.  PAST MEDICAL HISTORY: Past Medical History:  Diagnosis Date   Allergy    Anemia    Anxiety    Arthritis    Asthma    Blood transfusion without reported diagnosis    C.  difficile diarrhea 04/07/2016   CAP (community acquired pneumonia) 03/21/2015   Combined forms of age-related cataract of both eyes 05/06/2016   COPD (chronic obstructive pulmonary disease) (HCC)    CVA (cerebral vascular accident) ([place]) 06/17/2018   Diabetes mellitus without complication (HCC)    Displacement of lumbar intervertebral disc without myelopathy    Emphysema of lung (HCC)    GERD (gastroesophageal reflux disease)    Glaucoma    History of chicken pox    Hypertension    Pneumonia 03/29/2015   Sepsis ([place]) 03/17/2016   Shortness of breath    Small cell lung cancer ([place])     PAST SURGICAL HISTORY: Past Surgical History:  Procedure Laterality Date   CESAREAN SECTION     TUBAL LIGATION      FAMILY HISTORY: Family History  Problem Relation Age of Onset   Heart failure Mother    Heart disease Mother    Stroke Mother    Heart disease Brother    COPD Brother    Cancer Maternal Aunt        Breast Cancer    ADVANCED DIRECTIVES (Y/N):  N  HEALTH MAINTENANCE: Social History   Tobacco Use   Smoking status: Former Smoker    Packs/day: 0.25    Years: 45.00    Pack years: 11.25    Types: Cigarettes    Quit date: 03/17/2016    Years since quitting: 2.4   Smokeless tobacco: Never Used   Tobacco comment: pt states she quit in 2016-2017  Substance Use Topics   Alcohol use: No    Alcohol/week: 0.0 standard drinks   Drug  use: No     Colonoscopy:  PAP:  Bone density:  Lipid panel:  Allergies  Allergen Reactions   Codeine Anaphylaxis   Crestor [Rosuvastatin] Anaphylaxis   Other Anaphylaxis   Penicillins Anaphylaxis, Rash and Other (See Comments)    Reaction:  Tongue swelling  Has patient had a PCN reaction causing immediate rash, facial/tongue/throat swelling, SOB or lightheadedness with hypotension:  Yes   Has patient had a PCN reaction causing severe rash involving mucus membranes or skin necrosis: No Has patient had a PCN reaction  that required hospitalization No Has patient had a PCN reaction occurring within the last 10 years: No If all of the above answers are "NO", then may proceed with Cephalosporin use.   Yellow Jacket Venom [Bee Venom] Anaphylaxis   Gemfibrozil Rash, Nausea And Vomiting and Swelling   Statins Nausea And Vomiting and Swelling   Trazodone And Nefazodone Nausea And Vomiting   Lipitor [Atorvastatin] Rash    Current Outpatient Medications  Medication Sig Dispense Refill   albuterol (PROVENTIL) (2.5 MG/3ML) 0.083% nebulizer solution Take 3 mLs (2.5 mg total) by nebulization every 6 (six) hours as needed for wheezing or shortness of breath. 75 mL 3   benzonatate (TESSALON PERLES) 100 MG capsule Take 1 capsule (100 mg total) by mouth 3 (three) times daily as needed for cough. 60 capsule 3   clonazePAM (KLONOPIN) 0.5 MG tablet Take 1 tablet (0.5 mg total) by mouth 2 (two) times daily for 15 days. 30 tablet 0   clopidogrel (PLAVIX) 75 MG tablet Take 1 tablet (75 mg total) by mouth daily. 30 tablet 3   ezetimibe (ZETIA) 10 MG tablet Take 1 tablet (10 mg total) by mouth daily. 30 tablet 12   gabapentin (NEURONTIN) 300 MG capsule Take 1 capsule (300 mg total) by mouth 5 (five) times daily. 150 capsule 5   glucose blood test strip Contour test strip. Use to check blood sugar once a day for for type 2 diabetes (E11.9) 100 each 4   Lancets MISC Use to check blood sugar once daily for type 2 diabetes E11.9 100 each 4   meloxicam (MOBIC) 15 MG tablet TAKE 1 TABLET BY MOUTH ONCE DAILY AS NEEDED FOR PAIN (Patient not taking: No sig reported) 90 tablet 3   metFORMIN (GLUCOPHAGE-XR) 500 MG 24 hr tablet Take 1 tablet (500 mg total) by mouth 2 (two) times daily with a meal. 60 tablet 12   montelukast (SINGULAIR) 10 MG tablet Take 1 tablet (10 mg total) by mouth at bedtime. 90 tablet 3   NARCAN 4 MG/0.1ML LIQD nasal spray kit Place 0.4 mg into the nose once.   99   oxyCODONE-acetaminophen (PERCOCET)  10-325 MG tablet One tablet every 4-6 hours as needed. 180 tablet 0   PROAIR HFA 108 (90 Base) MCG/ACT inhaler INHALE 2 PUFFS BY MOUTH EVERY 6 HOURS AS NEEDED FOR WHEEZING OR SHORTNESS OF BREATH  3   ramelteon (ROZEREM) 8 MG tablet Take 1 tablet (8 mg total) by mouth at bedtime. 30 tablet 3   sertraline (ZOLOFT) 25 MG tablet Take by mouth.     sucralfate (CARAFATE) 1 g tablet Take 1 tablet (1 g total) by mouth 4 (four) times daily -  with meals and at bedtime. 120 tablet 4   umeclidinium-vilanterol (ANORO ELLIPTA) 62.5-25 MCG/INH AEPB Inhale 1 puff into the lungs daily. 30 each 12   zaleplon (SONATA) 10 MG capsule TAKE 1 CAPSULE BY MOUTH IN THE EVENING. MAY TAKE SECOND CAPSULE AFTER 2  HOURS IF NEEDED 30 capsule 3   No current facility-administered medications for this visit.     OBJECTIVE: There were no vitals filed for this visit.   There is no height or weight on file to calculate BMI.    ECOG FS:0 - Asymptomatic  General: Well-developed, well-nourished, no acute distress. Eyes: Pink conjunctiva, anicteric sclera. HEENT: Normocephalic, moist mucous membranes. Lungs: Clear to auscultation bilaterally. Heart: Regular rate and rhythm. No rubs, murmurs, or gallops. Abdomen: Soft, nontender, nondistended. No organomegaly noted, normoactive bowel sounds. Musculoskeletal: No edema, cyanosis, or clubbing. Neuro: Alert, answering all questions appropriately. Cranial nerves grossly intact. Skin: No rashes or petechiae noted. Psych: Normal affect.   LAB RESULTS:  Lab Results  Component Value Date   NA 137 07/21/2018   K 3.7 07/21/2018   CL 101 07/21/2018   CO2 24 07/21/2018   GLUCOSE 145 (H) 07/21/2018   BUN 11 07/21/2018   CREATININE 0.46 09/10/2018   CALCIUM 9.1 07/21/2018   PROT 7.8 06/17/2018   ALBUMIN 4.3 06/17/2018   AST 21 06/17/2018   ALT 15 06/17/2018   ALKPHOS 77 06/17/2018   BILITOT 0.7 06/17/2018   GFRNONAA >60 09/10/2018   GFRAA >60 09/10/2018    Lab Results    Component Value Date   WBC 7.2 07/21/2018   NEUTROABS 7.2 06/17/2018   HGB 13.1 07/21/2018   HCT 41.2 07/21/2018   MCV 94.7 07/21/2018   PLT 292 07/21/2018     STUDIES: Ct Chest W Contrast  Result Date: 09/10/2018 CLINICAL DATA:  Restaging lung cancer. Status post chemotherapy and radiation therapy. EXAM: CT CHEST WITH CONTRAST TECHNIQUE: Multidetector CT imaging of the chest was performed during intravenous contrast administration. CONTRAST:  37m OMNIPAQUE IOHEXOL 300 MG/ML  SOLN COMPARISON:  03/05/2018 FINDINGS: Cardiovascular: The heart size is normal. There is trace pericardial fluid identified. Similar to previous exam. Aortic atheromatous crash that calcification in the LAD and left circumflex and RCA coronary arteries. Mediastinum/Nodes: No enlarged mediastinal, hilar, or axillary lymph nodes. Thyroid gland, trachea, and esophagus demonstrate no significant findings. Lungs/Pleura: No o pleural effusion or pneumothorax. Paramediastinal fibrosis and masslike architectural distortion within the right lung is again identified compatible with changes from external beam radiation. The appearance is unchanged when compared with the previous exam. Several new patchy areas of ground-glass attenuation are identified within the right upper lobe and apex, likely postinflammatory. Pulmonary nodule within the right lower lobe measures 5 mm, image 79/3. This is new compared with the previous exam. 4 mm left lower lobe nodule is also new, image 113/3. Upper Abdomen: No acute abnormality. Musculoskeletal: No acute or suspicious osseous abnormality identified. IMPRESSION: 1. 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 No specific findings to suggest local tumor recurrence. 2. There are 2 new small lower lobe pulmonary nodules which have a nonspecific appearance. The largest is in the right lower lobe measuring 5 mm. Consider short-term interval follow-up to ensure resolution with repeat CT of the  chest in 3-6 months. 3. Unchanged small pericardial effusion. 4. Aortic Atherosclerosis (ICD10-I70.0). Coronary artery calcifications. Electronically Signed   By: TKerby MoorsM.D.   On: 09/10/2018 11:25    ASSESSMENT: Clinical stage IIIB small cell lung cancer of the right lung.  PLAN:    1. Clinical stage IIIB small cell lung cancer of the right lung: Patient completed concurrent chemotherapy and XRT at outside facility [facility]. She completed cycle 6 of carboplatinum  and etoposide on August 15, 2016.  She also completed PCI.  Her most recent CT scan on March 05, 2018 reviewed independently and reported as above with no obvious evidence of progressive or recurrent disease.  Return to clinic in 6 months with repeat imaging and further evaluation.  2.  Cough/shortness of breath: Patient has requested referral to a different pulmonologist. 3.  Back pain: Chronic, continue symptomatic treatment. 4.  Weakness and fatigue: Will draw hemoglobin and iron stores at next clinic visit.  Patient expressed understanding and was in agreement with this plan. She also understands that She can call clinic at any time with any questions, concerns, or complaints.   Cancer Staging Small cell lung cancer, right [facility]) Staging form: Lung, AJCC 8th Edition - Clinical stage from 08/18/2016: Stage IIIB (cT2a, cN3, cM0) - Signed by Lloyd Huger, MD on 08/18/2016   Lloyd Huger, MD   09/10/2018 11:49 PM

## 2018-09-15 ENCOUNTER — Encounter: Payer: Self-pay | Admitting: Oncology

## 2018-09-15 ENCOUNTER — Inpatient Hospital Stay: Payer: Medicare Other | Admitting: Oncology

## 2018-09-16 DIAGNOSIS — R0602 Shortness of breath: Secondary | ICD-10-CM | POA: Diagnosis not present

## 2018-09-16 DIAGNOSIS — R55 Syncope and collapse: Secondary | ICD-10-CM | POA: Diagnosis not present

## 2018-09-16 DIAGNOSIS — I10 Essential (primary) hypertension: Secondary | ICD-10-CM | POA: Diagnosis not present

## 2018-09-16 DIAGNOSIS — E782 Mixed hyperlipidemia: Secondary | ICD-10-CM | POA: Diagnosis not present

## 2018-09-16 DIAGNOSIS — I2510 Atherosclerotic heart disease of native coronary artery without angina pectoris: Secondary | ICD-10-CM | POA: Diagnosis not present

## 2018-09-16 DIAGNOSIS — I6389 Other cerebral infarction: Secondary | ICD-10-CM | POA: Diagnosis not present

## 2018-09-18 ENCOUNTER — Encounter (INDEPENDENT_AMBULATORY_CARE_PROVIDER_SITE_OTHER): Payer: Medicare Other | Admitting: Family Medicine

## 2018-09-18 ENCOUNTER — Other Ambulatory Visit: Payer: Self-pay

## 2018-09-18 DIAGNOSIS — I10 Essential (primary) hypertension: Secondary | ICD-10-CM | POA: Diagnosis not present

## 2018-09-18 DIAGNOSIS — H409 Unspecified glaucoma: Secondary | ICD-10-CM

## 2018-09-18 DIAGNOSIS — I69393 Ataxia following cerebral infarction: Secondary | ICD-10-CM

## 2018-09-18 DIAGNOSIS — K219 Gastro-esophageal reflux disease without esophagitis: Secondary | ICD-10-CM

## 2018-09-18 DIAGNOSIS — F411 Generalized anxiety disorder: Secondary | ICD-10-CM

## 2018-09-18 DIAGNOSIS — E1140 Type 2 diabetes mellitus with diabetic neuropathy, unspecified: Secondary | ICD-10-CM

## 2018-09-18 DIAGNOSIS — M5441 Lumbago with sciatica, right side: Secondary | ICD-10-CM | POA: Diagnosis not present

## 2018-09-18 DIAGNOSIS — J439 Emphysema, unspecified: Secondary | ICD-10-CM

## 2018-09-19 ENCOUNTER — Telehealth: Payer: Self-pay | Admitting: Family Medicine

## 2018-09-19 NOTE — Telephone Encounter (Signed)
Please remind patient that she needs to bring her bottles of oxycodone/apap to her appointment on Tuesday. [place] [facility] requires that inspect her medication bottles. Since oxycodone/apap is schedule II drug.

## 2018-09-21 NOTE — Telephone Encounter (Signed)
LMTCB 09/21/2018  Thanks,   -Mickel Baas

## 2018-09-21 NOTE — Telephone Encounter (Signed)
Pt advised.   Thanks,   -Laura  

## 2018-09-22 ENCOUNTER — Encounter: Payer: Self-pay | Admitting: Family Medicine

## 2018-09-22 ENCOUNTER — Other Ambulatory Visit: Payer: Self-pay

## 2018-09-22 ENCOUNTER — Ambulatory Visit (INDEPENDENT_AMBULATORY_CARE_PROVIDER_SITE_OTHER): Payer: Medicare Other | Admitting: Family Medicine

## 2018-09-22 VITALS — BP 96/67 | HR 92 | Temp 97.1°F | Wt 170.0 lb

## 2018-09-22 DIAGNOSIS — F1190 Opioid use, unspecified, uncomplicated: Secondary | ICD-10-CM | POA: Diagnosis not present

## 2018-09-22 DIAGNOSIS — G8929 Other chronic pain: Secondary | ICD-10-CM

## 2018-09-22 DIAGNOSIS — I639 Cerebral infarction, unspecified: Secondary | ICD-10-CM

## 2018-09-22 DIAGNOSIS — M545 Low back pain, unspecified: Secondary | ICD-10-CM

## 2018-09-22 DIAGNOSIS — E1129 Type 2 diabetes mellitus with other diabetic kidney complication: Secondary | ICD-10-CM

## 2018-09-22 DIAGNOSIS — R809 Proteinuria, unspecified: Secondary | ICD-10-CM

## 2018-09-22 LAB — POCT GLYCOSYLATED HEMOGLOBIN (HGB A1C): Hemoglobin A1C: 6.4 % — AB (ref 4.0–5.6)

## 2018-09-22 MED ORDER — GABAPENTIN 600 MG PO TABS: ORAL_TABLET | ORAL | 5 refills | Status: DC

## 2018-09-22 NOTE — Progress Notes (Signed)
Patient: Yvette Riley Female    DOB: Aug 29, 1955   63 y.o.   MRN: [id number] Visit Date: 09/22/2018  Today's Provider: Lelon Huh, MD   Chief Complaint  Patient presents with  . Diabetes  . Back Pain  . Obesity   Subjective:     HPI     Diabetes Mellitus Type II, Follow-up:   Lab Results  Component Value Date   HGBA1C 6.3 (H) 06/17/2018   HGBA1C 6.4 (H) 06/02/2018   HGBA1C 6.5 (A) 08/08/2017   Last seen for diabetes 3 months ago.  Management since then includes Hold off on ARB due to recent CVA and low PB.  Consider adding it at follow up visit.  She reports excellent compliance with treatment. She is not having side effects.  Current symptoms include none and have been stable. Home blood sugar records: fasting range: 125  Episodes of hypoglycemia? no   Most Recent Eye Exam: Pt states she is due for an eye exam. Weight trend: stable Current diet: in general, a "healthy" diet   Current exercise: walking, also home exercises from physical therapy.  ------------------------------------------------------------------------ Follow up chronic back pain Continue on oxycodone/apap which she mostly takes on a schedule every 4 hours. Throughout the day. States she runs out early because she has to take one in the middle of the night. Advised that the Q4 hour schedule is to include the tablet she takes in the middle of the night. She brings in her bottle of oxycodone/apap today with 42 tablets left. Last dispensed #180 tablets on 08/30/2018, should have no less than 42 tablets.   Allergies  Allergen Reactions  . Codeine Anaphylaxis  . Crestor [Rosuvastatin] Anaphylaxis  . Other Anaphylaxis  . Penicillins Anaphylaxis, Rash and Other (See Comments)    Reaction:  Tongue swelling  Has patient had a PCN reaction causing immediate rash, facial/tongue/throat swelling, SOB or lightheadedness with hypotension:  Yes   Has patient had a PCN reaction causing severe rash  involving mucus membranes or skin necrosis: No Has patient had a PCN reaction that required hospitalization No Has patient had a PCN reaction occurring within the last 10 years: No If all of the above answers are "NO", then may proceed with Cephalosporin use.  . Yellow Jacket Venom [Bee Venom] Anaphylaxis  . Gemfibrozil Rash, Nausea And Vomiting and Swelling  . Statins Nausea And Vomiting and Swelling  . Trazodone And Nefazodone Nausea And Vomiting  . Lipitor [Atorvastatin] Rash     Current Outpatient Medications:  .  albuterol (PROVENTIL) (2.5 MG/3ML) 0.083% nebulizer solution, Take 3 mLs (2.5 mg total) by nebulization every 6 (six) hours as needed for wheezing or shortness of breath., Disp: 75 mL, Rfl: 3 .  benzonatate (TESSALON PERLES) 100 MG capsule, Take 1 capsule (100 mg total) by mouth 3 (three) times daily as needed for cough., Disp: 60 capsule, Rfl: 3 .  clopidogrel (PLAVIX) 75 MG tablet, Take 1 tablet (75 mg total) by mouth daily., Disp: 30 tablet, Rfl: 3 .  ezetimibe (ZETIA) 10 MG tablet, Take 1 tablet (10 mg total) by mouth daily., Disp: 30 tablet, Rfl: 12 .  gabapentin (NEURONTIN) 300 MG capsule, Take 1 capsule (300 mg total) by mouth 5 (five) times daily., Disp: 150 capsule, Rfl: 5 .  glucose blood test strip, Contour test strip. Use to check blood sugar once a day for for type 2 diabetes (E11.9), Disp: 100 each, Rfl: 4 .  Lancets MISC, Use to check  blood sugar once daily for type 2 diabetes E11.9, Disp: 100 each, Rfl: 4 .  metFORMIN (GLUCOPHAGE-XR) 500 MG 24 hr tablet, Take 1 tablet (500 mg total) by mouth 2 (two) times daily with a meal., Disp: 60 tablet, Rfl: 12 .  oxyCODONE-acetaminophen (PERCOCET) 10-325 MG tablet, One tablet every 4-6 hours as needed., Disp: 180 tablet, Rfl: 0 .  sertraline (ZOLOFT) 25 MG tablet, Take by mouth., Disp: , Rfl:  .  umeclidinium-vilanterol (ANORO ELLIPTA) 62.5-25 MCG/INH AEPB, Inhale 1 puff into the lungs daily., Disp: 30 each, Rfl: 12 .   zaleplon (SONATA) 10 MG capsule, TAKE 1 CAPSULE BY MOUTH IN THE EVENING. MAY TAKE SECOND CAPSULE AFTER 2 HOURS IF NEEDED, Disp: 30 capsule, Rfl: 3 .  clonazePAM (KLONOPIN) 0.5 MG tablet, Take 1 tablet (0.5 mg total) by mouth 2 (two) times daily for 15 days., Disp: 30 tablet, Rfl: 0 .  meloxicam (MOBIC) 15 MG tablet, TAKE 1 TABLET BY MOUTH ONCE DAILY AS NEEDED FOR PAIN (Patient not taking: No sig reported), Disp: 90 tablet, Rfl: 3 .  metoprolol succinate (TOPROL-XL) 50 MG 24 hr tablet, Take 50 mg by mouth daily., Disp: , Rfl:  .  montelukast (SINGULAIR) 10 MG tablet, Take 1 tablet (10 mg total) by mouth at bedtime. (Patient not taking: Reported on 09/22/2018), Disp: 90 tablet, Rfl: 3 .  NARCAN 4 MG/0.1ML LIQD nasal spray kit, Place 0.4 mg into the nose once. , Disp: , Rfl: 99 .  PROAIR HFA 108 (90 Base) MCG/ACT inhaler, INHALE 2 PUFFS BY MOUTH EVERY 6 HOURS AS NEEDED FOR WHEEZING OR SHORTNESS OF BREATH, Disp: , Rfl: 3 .  ramelteon (ROZEREM) 8 MG tablet, Take 1 tablet (8 mg total) by mouth at bedtime. (Patient not taking: Reported on 09/22/2018), Disp: 30 tablet, Rfl: 3 .  sucralfate (CARAFATE) 1 g tablet, Take 1 tablet (1 g total) by mouth 4 (four) times daily -  with meals and at bedtime., Disp: 120 tablet, Rfl: 4  Review of Systems  Constitutional: Negative.   Respiratory: Negative.   Cardiovascular: Negative.   Gastrointestinal: Negative.   Musculoskeletal: Positive for arthralgias, back pain and gait problem. Negative for joint swelling, myalgias, neck pain and neck stiffness.       Pt reports having trouble with lower back and bilateral leg pain   Neurological: Positive for weakness (especially on the right side.). Negative for dizziness, light-headedness and headaches.    Social History   Tobacco Use  . Smoking status: Former Smoker    Packs/day: 0.25    Years: 45.00    Pack years: 11.25    Types: Cigarettes    Quit date: 03/17/2016    Years since quitting: 2.5  . Smokeless tobacco:  Never Used  . Tobacco comment: pt states she quit in 2016-2017  Substance Use Topics  . Alcohol use: No    Alcohol/week: 0.0 standard drinks      Objective:   BP 96/67 (BP Location: Right Arm, Patient Position: Sitting, Cuff Size: Large)   Pulse 92   Temp (!) 97.1 F (36.2 C) (Oral)   Wt 170 lb (77.1 kg)   BMI 36.15 kg/m  Vitals:   09/22/18 1531  BP: 96/67  Pulse: 92  Temp: (!) 97.1 F (36.2 C)  TempSrc: Oral  Weight: 170 lb (77.1 kg)     Physical Exam  General Appearance:    Alert, cooperative, no distress, obese  Eyes:    PERRL, conjunctiva/corneas clear, EOM's intact  Lungs:     Clear to auscultation bilaterally, respirations unlabored  Heart:    Normal heart rate. Normal rhythm. No murmurs, rubs, or gallops.   MS:   All extremities are intact.   Neurologic:   Awake, alert, oriented x 3. No apparent focal neurological           defect.       Results for orders placed or performed in visit on 09/22/18  POCT glycosylated hemoglobin (Hb A1C)  Result Value Ref Range   Hemoglobin A1C 6.4 (A) 4.0 - 5.6 %       Assessment & Plan    1. Type 2 diabetes mellitus with microalbuminuria, without long-term current use of insulin (HCC) Well controlled.  Continue current medications.    2. Chronic, continuous use of opioids Pill count matches expected   3. Chronic midline low back pain, unspecified whether sciatica present Stable on current pain medication regiment.   4. Severe obesity (BMI 35.0-39.9) with comorbidity ([place]) Encourage healthier eating and weight loss which would also likely help control back pain.     The entirety of the information documented in the History of Present Illness, Review of Systems and Physical Exam were personally obtained by me. Portions of this information were initially documented by Ashley Royalty, CMA and reviewed by me for thoroughness and accuracy.      Lelon Huh, MD  [facility]

## 2018-09-24 ENCOUNTER — Other Ambulatory Visit: Payer: Self-pay | Admitting: Family Medicine

## 2018-09-24 DIAGNOSIS — G8929 Other chronic pain: Secondary | ICD-10-CM

## 2018-09-24 DIAGNOSIS — M5442 Lumbago with sciatica, left side: Secondary | ICD-10-CM

## 2018-09-24 NOTE — Telephone Encounter (Signed)
Patient calling for refill on Oxycodone to be sent to [facility] on [address]

## 2018-09-26 NOTE — Progress Notes (Deleted)
[place]  [phone] [phone]  ID: Yvette Riley OB: December 28, 1955  MR#: [id number]  DUK#:[id number]  Patient Care Team: Birdie Sons, MD as PCP - General (Family Medicine) Lorelee Cover., MD (Ophthalmology)  CHIEF COMPLAINT: Clinical stage IIIB small cell lung cancer of the right lung.  INTERVAL HISTORY: Patient returns to clinic today for further evaluation and discussion of her imaging results.  She continues to have chronic shortness of breath.  She also complains of increased weakness and fatigue.  She has chronic back pain. She has no neurologic complaints.  She denies any recent fevers or illnesses.  She has a good appetite and denies weight loss.  She denies any chest pain, cough, or hemoptysis.  She denies any nausea, vomiting, constipation, or diarrhea.  She denies any melena or hematochezia.  She has no urinary complaints.  Patient offers no further specific complaints today.  REVIEW OF SYSTEMS:   Review of Systems  Constitutional: Positive for malaise/fatigue. Negative for fever and weight loss.  Respiratory: Positive for shortness of breath. Negative for cough and hemoptysis.   Cardiovascular: Negative.  Negative for chest pain and leg swelling.  Gastrointestinal: Negative.  Negative for abdominal pain and constipation.  Genitourinary: Negative.  Negative for dysuria.  Musculoskeletal: Positive for back pain. Negative for myalgias.  Skin: Negative.  Negative for rash.  Neurological: Positive for weakness. Negative for sensory change, focal weakness and headaches.  Psychiatric/Behavioral: Negative.  The patient is not nervous/anxious.     As per HPI. Otherwise, a complete review of systems is negative.  PAST MEDICAL HISTORY: Past Medical History:  Diagnosis Date   Allergy    Anemia    Anxiety    Arthritis    Asthma    Blood transfusion without reported diagnosis    C. difficile diarrhea 04/07/2016   CAP  (community acquired pneumonia) 03/21/2015   Combined forms of age-related cataract of both eyes 05/06/2016   COPD (chronic obstructive pulmonary disease) (HCC)    CVA (cerebral vascular accident) ([place]) 06/17/2018   Diabetes mellitus without complication (HCC)    Displacement of lumbar intervertebral disc without myelopathy    Emphysema of lung (HCC)    GERD (gastroesophageal reflux disease)    Glaucoma    History of chicken pox    Hypertension    Pneumonia 03/29/2015   Sepsis ([place]) 03/17/2016   Shortness of breath    Small cell lung cancer ([place])     PAST SURGICAL HISTORY: Past Surgical History:  Procedure Laterality Date   CESAREAN SECTION     TUBAL LIGATION      FAMILY HISTORY: Family History  Problem Relation Age of Onset   Heart failure Mother    Heart disease Mother    Stroke Mother    Heart disease Brother    COPD Brother    Cancer Maternal Aunt        Breast Cancer    ADVANCED DIRECTIVES (Y/N):  N  HEALTH MAINTENANCE: Social History   Tobacco Use   Smoking status: Former Smoker    Packs/day: 0.25    Years: 45.00    Pack years: 11.25    Types: Cigarettes    Quit date: 03/17/2016    Years since quitting: 2.5   Smokeless tobacco: Never Used   Tobacco comment: pt states she quit in 2016-2017  Substance Use Topics   Alcohol use: No    Alcohol/week: 0.0 standard drinks   Drug use: No     Colonoscopy:  PAP:  Bone density:  Lipid panel:  Allergies  Allergen Reactions   Codeine Anaphylaxis   Crestor [Rosuvastatin] Anaphylaxis   Other Anaphylaxis   Penicillins Anaphylaxis, Rash and Other (See Comments)    Reaction:  Tongue swelling  Has patient had a PCN reaction causing immediate rash, facial/tongue/throat swelling, SOB or lightheadedness with hypotension:  Yes   Has patient had a PCN reaction causing severe rash involving mucus membranes or skin necrosis: No Has patient had a PCN reaction that required hospitalization No Has  patient had a PCN reaction occurring within the last 10 years: No If all of the above answers are "NO", then may proceed with Cephalosporin use.   Yellow Jacket Venom [Bee Venom] Anaphylaxis   Gemfibrozil Rash, Nausea And Vomiting and Swelling   Statins Nausea And Vomiting and Swelling   Trazodone And Nefazodone Nausea And Vomiting   Lipitor [Atorvastatin] Rash    Current Outpatient Medications  Medication Sig Dispense Refill   albuterol (PROVENTIL) (2.5 MG/3ML) 0.083% nebulizer solution Take 3 mLs (2.5 mg total) by nebulization every 6 (six) hours as needed for wheezing or shortness of breath. 75 mL 3   benzonatate (TESSALON PERLES) 100 MG capsule Take 1 capsule (100 mg total) by mouth 3 (three) times daily as needed for cough. 60 capsule 3   clonazePAM (KLONOPIN) 0.5 MG tablet Take 1 tablet (0.5 mg total) by mouth 2 (two) times daily for 15 days. 30 tablet 0   clopidogrel (PLAVIX) 75 MG tablet Take 1 tablet (75 mg total) by mouth daily. 30 tablet 3   ezetimibe (ZETIA) 10 MG tablet Take 1 tablet (10 mg total) by mouth daily. 30 tablet 12   gabapentin (NEURONTIN) 600 MG tablet Take one tablet up to 4 times a day 120 tablet 5   glucose blood test strip Contour test strip. Use to check blood sugar once a day for for type 2 diabetes (E11.9) 100 each 4   Lancets MISC Use to check blood sugar once daily for type 2 diabetes E11.9 100 each 4   meloxicam (MOBIC) 15 MG tablet TAKE 1 TABLET BY MOUTH ONCE DAILY AS NEEDED FOR PAIN (Patient not taking: No sig reported) 90 tablet 3   metFORMIN (GLUCOPHAGE-XR) 500 MG 24 hr tablet Take 1 tablet (500 mg total) by mouth 2 (two) times daily with a meal. 60 tablet 12   metoprolol succinate (TOPROL-XL) 50 MG 24 hr tablet Take 50 mg by mouth daily.     montelukast (SINGULAIR) 10 MG tablet Take 1 tablet (10 mg total) by mouth at bedtime. (Patient not taking: Reported on 09/22/2018) 90 tablet 3   NARCAN 4 MG/0.1ML LIQD nasal spray kit Place 0.4 mg  into the nose once.   99   oxyCODONE-acetaminophen (PERCOCET) 10-325 MG tablet One tablet every 4-6 hours as needed. 180 tablet 0   PROAIR HFA 108 (90 Base) MCG/ACT inhaler INHALE 2 PUFFS BY MOUTH EVERY 6 HOURS AS NEEDED FOR WHEEZING OR SHORTNESS OF BREATH  3   ramelteon (ROZEREM) 8 MG tablet Take 1 tablet (8 mg total) by mouth at bedtime. (Patient not taking: Reported on 09/22/2018) 30 tablet 3   sertraline (ZOLOFT) 25 MG tablet Take by mouth.     sucralfate (CARAFATE) 1 g tablet Take 1 tablet (1 g total) by mouth 4 (four) times daily -  with meals and at bedtime. 120 tablet 4   umeclidinium-vilanterol (ANORO ELLIPTA) 62.5-25 MCG/INH AEPB Inhale 1 puff into the lungs daily. 30 each 12  zaleplon (SONATA) 10 MG capsule TAKE 1 CAPSULE BY MOUTH IN THE EVENING. MAY TAKE SECOND CAPSULE AFTER 2 HOURS IF NEEDED 30 capsule 3   No current facility-administered medications for this visit.     OBJECTIVE: There were no vitals filed for this visit.   There is no height or weight on file to calculate BMI.    ECOG FS:0 - Asymptomatic  General: Well-developed, well-nourished, no acute distress. Eyes: Pink conjunctiva, anicteric sclera. HEENT: Normocephalic, moist mucous membranes. Lungs: Clear to auscultation bilaterally. Heart: Regular rate and rhythm. No rubs, murmurs, or gallops. Abdomen: Soft, nontender, nondistended. No organomegaly noted, normoactive bowel sounds. Musculoskeletal: No edema, cyanosis, or clubbing. Neuro: Alert, answering all questions appropriately. Cranial nerves grossly intact. Skin: No rashes or petechiae noted. Psych: Normal affect.   LAB RESULTS:  Lab Results  Component Value Date   NA 137 07/21/2018   K 3.7 07/21/2018   CL 101 07/21/2018   CO2 24 07/21/2018   GLUCOSE 145 (H) 07/21/2018   BUN 11 07/21/2018   CREATININE 0.46 09/10/2018   CALCIUM 9.1 07/21/2018   PROT 7.8 06/17/2018   ALBUMIN 4.3 06/17/2018   AST 21 06/17/2018   ALT 15 06/17/2018   ALKPHOS  77 06/17/2018   BILITOT 0.7 06/17/2018   GFRNONAA >60 09/10/2018   GFRAA >60 09/10/2018    Lab Results  Component Value Date   WBC 7.2 07/21/2018   NEUTROABS 7.2 06/17/2018   HGB 13.1 07/21/2018   HCT 41.2 07/21/2018   MCV 94.7 07/21/2018   PLT 292 07/21/2018     STUDIES: Ct Chest W Contrast  Result Date: 09/10/2018 CLINICAL DATA:  Restaging lung cancer. Status post chemotherapy and radiation therapy. EXAM: CT CHEST WITH CONTRAST TECHNIQUE: Multidetector CT imaging of the chest was performed during intravenous contrast administration. CONTRAST:  11m OMNIPAQUE IOHEXOL 300 MG/ML  SOLN COMPARISON:  03/05/2018 FINDINGS: Cardiovascular: The heart size is normal. There is trace pericardial fluid identified. Similar to previous exam. Aortic atheromatous crash that calcification in the LAD and left circumflex and RCA coronary arteries. Mediastinum/Nodes: No enlarged mediastinal, hilar, or axillary lymph nodes. Thyroid gland, trachea, and esophagus demonstrate no significant findings. Lungs/Pleura: No o pleural effusion or pneumothorax. Paramediastinal fibrosis and masslike architectural distortion within the right lung is again identified compatible with changes from external beam radiation. The appearance is unchanged when compared with the previous exam. Several new patchy areas of ground-glass attenuation are identified within the right upper lobe and apex, likely postinflammatory. Pulmonary nodule within the right lower lobe measures 5 mm, image 79/3. This is new compared with the previous exam. 4 mm left lower lobe nodule is also new, image 113/3. Upper Abdomen: No acute abnormality. Musculoskeletal: No acute or suspicious osseous abnormality identified. IMPRESSION: 1. 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 No specific findings to suggest local tumor recurrence. 2. There are 2 new small lower lobe pulmonary nodules which have a nonspecific appearance. The largest is in the right  lower lobe measuring 5 mm. Consider short-term interval follow-up to ensure resolution with repeat CT of the chest in 3-6 months. 3. Unchanged small pericardial effusion. 4. Aortic Atherosclerosis (ICD10-I70.0). Coronary artery calcifications. Electronically Signed   By: TKerby MoorsM.D.   On: 09/10/2018 11:25    ASSESSMENT: Clinical stage IIIB small cell lung cancer of the right lung.  PLAN:    1. Clinical stage IIIB small cell lung cancer of the right lung: Patient completed concurrent chemotherapy and XRT at outside facility and then  transferred care to [facility] to complete treatment and long-term monitoring for recurrence. She completed cycle 6 of carboplatinum and etoposide on August 15, 2016.  She also completed PCI.  Her most recent CT scan on March 05, 2018 reviewed independently and reported as above with no obvious evidence of progressive or recurrent disease.  Return to clinic in 6 months with repeat imaging and further evaluation.  2.  Cough/shortness of breath: Patient has requested referral to a different pulmonologist. 3.  Back pain: Chronic, continue symptomatic treatment. 4.  Weakness and fatigue: Will draw hemoglobin and iron stores at next clinic visit.  Patient expressed understanding and was in agreement with this plan. She also understands that She can call clinic at any time with any questions, concerns, or complaints.   Cancer Staging Small cell lung cancer, right [facility]) Staging form: Lung, AJCC 8th Edition - Clinical stage from 08/18/2016: Stage IIIB (cT2a, cN3, cM0) - Signed by Lloyd Huger, MD on 08/18/2016   Lloyd Huger, MD   09/26/2018 7:24 AM

## 2018-09-28 ENCOUNTER — Other Ambulatory Visit: Payer: Self-pay | Admitting: Family Medicine

## 2018-09-28 ENCOUNTER — Inpatient Hospital Stay: Payer: Medicare Other | Admitting: Oncology

## 2018-09-28 MED ORDER — OXYCODONE-ACETAMINOPHEN 10-325 MG PO TABS: ORAL_TABLET | ORAL | 0 refills | Status: DC

## 2018-09-28 MED ORDER — GABAPENTIN 600 MG PO TABS: ORAL_TABLET | ORAL | 5 refills | Status: DC

## 2018-09-28 NOTE — Telephone Encounter (Signed)
Pt needs refill on her Gabapentin 600 mg  [place]

## 2018-09-30 ENCOUNTER — Ambulatory Visit: Payer: Medicare Other | Attending: Radiation Oncology | Admitting: Radiation Oncology

## 2018-10-01 NOTE — Patient Instructions (Signed)
.   Please review the attached list of medications and notify my office if there are any errors.   . Please bring all of your medications to every appointment so we can make sure that our medication list is the same as yours.   . It is especially important to get the annual flu vaccine this year. If you haven't had it already, please go to your pharmacy or call the office as soon as possible to schedule you flu shot.  

## 2018-10-02 ENCOUNTER — Encounter: Payer: Self-pay | Admitting: Oncology

## 2018-10-02 ENCOUNTER — Inpatient Hospital Stay: Payer: Medicare Other | Admitting: Oncology

## 2018-10-02 ENCOUNTER — Other Ambulatory Visit: Payer: Self-pay

## 2018-10-21 ENCOUNTER — Other Ambulatory Visit: Payer: Self-pay | Admitting: Family Medicine

## 2018-10-21 DIAGNOSIS — G8929 Other chronic pain: Secondary | ICD-10-CM

## 2018-10-21 DIAGNOSIS — M5442 Lumbago with sciatica, left side: Secondary | ICD-10-CM

## 2018-10-21 NOTE — Telephone Encounter (Signed)
Pt needing a refill on:  oxyCODONE-acetaminophen (PERCOCET) 10-325 MG tablet  Please fill at:  [place] [place] [address] [phone] (Phone) [phone] (Fax)   Thanks, [employer]

## 2018-10-22 NOTE — Telephone Encounter (Signed)
Last dispensed 30 day supply on 09-28-2018

## 2018-10-26 ENCOUNTER — Other Ambulatory Visit: Payer: Self-pay | Admitting: *Deleted

## 2018-10-26 DIAGNOSIS — G8929 Other chronic pain: Secondary | ICD-10-CM

## 2018-10-26 DIAGNOSIS — M5442 Lumbago with sciatica, left side: Secondary | ICD-10-CM

## 2018-10-26 DIAGNOSIS — R05 Cough: Secondary | ICD-10-CM

## 2018-10-26 DIAGNOSIS — R053 Chronic cough: Secondary | ICD-10-CM

## 2018-10-27 MED ORDER — BENZONATATE 100 MG PO CAPS: 100.0000 mg | ORAL_CAPSULE | Freq: Three times a day (TID) | ORAL | 3 refills | Status: DC | PRN

## 2018-10-27 MED ORDER — OXYCODONE-ACETAMINOPHEN 10-325 MG PO TABS: ORAL_TABLET | ORAL | 0 refills | Status: DC

## 2018-10-27 MED ORDER — ZALEPLON 10 MG PO CAPS: ORAL_CAPSULE | ORAL | 3 refills | Status: DC

## 2018-11-07 ENCOUNTER — Encounter: Payer: Self-pay | Admitting: Family Medicine

## 2018-11-23 ENCOUNTER — Telehealth: Payer: Self-pay | Admitting: Family Medicine

## 2018-11-23 DIAGNOSIS — M5442 Lumbago with sciatica, left side: Secondary | ICD-10-CM

## 2018-11-23 DIAGNOSIS — G8929 Other chronic pain: Secondary | ICD-10-CM

## 2018-11-23 NOTE — Telephone Encounter (Signed)
Pt contacted office for refill request on the following medications:  oxyCODONE-acetaminophen (PERCOCET) 10-325 MG tablet  [facility] Last Rx: 10/27/2018 LOV: 09/22/2018 Please advise. Thanks TNP

## 2018-11-24 NOTE — Progress Notes (Signed)
Subjective:   Yvette Riley is a 63 y.o. female who presents for Medicare Annual (Subsequent) preventive examination.    This visit is being conducted through telemedicine due to the COVID-19 pandemic. This patient has given me verbal consent via doximity to conduct this visit, patient states they are participating from their home address. Some vital signs may be absent or patient reported.    Patient identification: identified by name, DOB, and current address  Review of Systems:  N/A  Cardiac Risk Factors include: diabetes mellitus;dyslipidemia;obesity (BMI >30kg/m2)     Objective:     Vitals: There were no vitals taken for this visit.  There is no height or weight on file to calculate BMI. Unable to obtain vitals due to visit being conducted via telephonically.   Advanced Directives 11/25/2018 07/21/2018 06/17/2018 06/17/2018 03/12/2018 11/20/2017 11/03/2017  Does Patient Have a Medical Advance Directive? _0  No No  Type of Advance Directive - - - - - - -  Does patient want to make changes to medical advance directive? - - - - - - -  Copy of [place] in Chart? - - - - - - -  Would patient like information on creating a medical advance directive? No - Patient declined No - Patient declined No - Patient declined No - Patient declined No - Patient declined No - Patient declined No - Patient declined    Tobacco Social History   Tobacco Use  Smoking Status Former Smoker  . Packs/day: 0.25  . Years: 45.00  . Pack years: 11.25  . Types: Cigarettes  . Quit date: 03/17/2016  . Years since quitting: 2.6  Smokeless Tobacco Never Used  Tobacco Comment   pt states she quit in 2016-2017     Counseling given: Not Answered Comment: pt states she quit in 2016-2017   Clinical Intake:  Pre-visit preparation completed: Yes  Pain : 0-10 Pain Score: 7  Pain Type: Chronic pain(post stroke) Pain Location: Other (Comment)(right side of body) Pain  Descriptors / Indicators: Aching Pain Frequency: Constant     Nutritional Risks: Nausea/ vomitting/ diarrhea(Occasional nausea when she does not take Sucralfate.) Diabetes: Yes  How often do you need to have someone help you when you read instructions, pamphlets, or other written materials from your doctor or pharmacy?: 1 - Never   Diabetes:  Is the patient diabetic?  Yes type 2 If diabetic, was a CBG obtained today?  No  Did the patient bring in their glucometer from home?  No  How often do you monitor your CBG's? Once a day.   Financial Strains and Diabetes Management:  Are you having any financial strains with the device, your supplies or your medication? No .  Does the patient want to be seen by Chronic Care Management for management of their diabetes?  No  Would the patient like to be referred to a Nutritionist or for Diabetic Management?  No   Diabetic Exams:  Diabetic Eye Exam: Completed 01/10/16. Overdue for diabetic eye exam. Pt has been advised about the importance in completing this exam.   Diabetic Foot Exam: Completed 08/11/06. Pt has been advised about the importance in completing this exam. Note made to follow up on this at next in office visit.     Interpreter Needed?: No  Information entered by :: [facility], LPN  Past Medical History:  Diagnosis Date  . Allergy   . Anemia   . Anxiety   . Arthritis   .  Asthma   . Blood transfusion without reported diagnosis   . C. difficile diarrhea 04/07/2016  . CAP (community acquired pneumonia) 03/21/2015  . Combined forms of age-related cataract of both eyes 05/06/2016  . COPD (chronic obstructive pulmonary disease) ([place])   . CVA (cerebral vascular accident) ([place]) 06/17/2018  . Diabetes mellitus without complication ([place])   . Displacement of lumbar intervertebral disc without myelopathy   . Emphysema of lung ([place])   . GERD (gastroesophageal reflux disease)   . Glaucoma   . History of chicken pox   . Hypertension    . Pneumonia 03/29/2015  . Sepsis ([place]) 03/17/2016  . Shortness of breath   . Small cell lung cancer [facility])    Past Surgical History:  Procedure Laterality Date  . CESAREAN SECTION    . TUBAL LIGATION     Family History  Problem Relation Age of Onset  . Heart failure Mother   . Heart disease Mother   . Stroke Mother   . Heart disease Brother   . COPD Brother   . Cancer Maternal Aunt        Breast Cancer   Social History   Socioeconomic History  . Marital status: Widowed    Spouse name: Not on file  . Number of children: 2  . Years of education: Not on file  . Highest education level: 10th grade  Occupational History  . Occupation: disable  Social Needs  . Financial resource strain: Somewhat hard  . Food insecurity    Worry: Sometimes true    Inability: Sometimes true  . Transportation needs    Medical: No    Non-medical: No  Tobacco Use  . Smoking status: Former Smoker    Packs/day: 0.25    Years: 45.00    Pack years: 11.25    Types: Cigarettes    Quit date: 03/17/2016    Years since quitting: 2.6  . Smokeless tobacco: Never Used  . Tobacco comment: pt states she quit in 2016-2017  Substance and Sexual Activity  . Alcohol use: No    Alcohol/week: 0.0 standard drinks  . Drug use: No  . Sexual activity: Not Currently    Birth control/protection: Abstinence  Lifestyle  . Physical activity    Days per week: 0 days    Minutes per session: 0 min  . Stress: Very much  Relationships  . Social Herbalist on phone: Patient refused    Gets together: Patient refused    Attends religious service: Patient refused    Active member of club or organization: Patient refused    Attends meetings of clubs or organizations: Patient refused    Relationship status: Patient refused  Other Topics Concern  . Not on file  Social History Narrative  . Not on file    Outpatient Encounter Medications as of 11/25/2018  Medication Sig  . albuterol (PROVENTIL) (2.5  MG/3ML) 0.083% nebulizer solution Take 3 mLs (2.5 mg total) by nebulization every 6 (six) hours as needed for wheezing or shortness of breath.  . benzonatate (TESSALON PERLES) 100 MG capsule Take 1 capsule (100 mg total) by mouth 3 (three) times daily as needed for cough.  . clopidogrel (PLAVIX) 75 MG tablet Take 1 tablet (75 mg total) by mouth daily.  [name] ezetimibe (ZETIA) 10 MG tablet Take 1 tablet (10 mg total) by mouth daily.  [name] gabapentin (NEURONTIN) 600 MG tablet Take one tablet up to 4 times a day  . glucose blood test strip  Contour test strip. Use to check blood sugar once a day for for type 2 diabetes (E11.9)  . Lancets MISC Use to check blood sugar once daily for type 2 diabetes E11.9  . meloxicam (MOBIC) 15 MG tablet TAKE 1 TABLET BY MOUTH ONCE DAILY AS NEEDED FOR PAIN  . metFORMIN (GLUCOPHAGE-XR) 500 MG 24 hr tablet Take 1 tablet (500 mg total) by mouth 2 (two) times daily with a meal.  . metoprolol succinate (TOPROL-XL) 50 MG 24 hr tablet Take 50 mg by mouth daily.  [name] NARCAN 4 MG/0.1ML LIQD nasal spray kit Place 0.4 mg into the nose once.   [name] PROAIR HFA 108 (90 Base) MCG/ACT inhaler INHALE 2 PUFFS BY MOUTH EVERY 6 HOURS AS NEEDED FOR WHEEZING OR SHORTNESS OF BREATH  . sertraline (ZOLOFT) 25 MG tablet Take 25 mg by mouth daily.   . sucralfate (CARAFATE) 1 g tablet Take 1 tablet (1 g total) by mouth 4 (four) times daily -  with meals and at bedtime.  [name] umeclidinium-vilanterol (ANORO ELLIPTA) 62.5-25 MCG/INH AEPB Inhale 1 puff into the lungs daily.  . zaleplon (SONATA) 10 MG capsule TAKE 1 CAPSULE BY MOUTH IN THE EVENING. MAY TAKE SECOND CAPSULE AFTER 2 HOURS IF NEEDED  . [DISCONTINUED] oxyCODONE-acetaminophen (PERCOCET) 10-325 MG tablet One tablet every 4-6 hours as needed.  . clonazePAM (KLONOPIN) 0.5 MG tablet Take 1 tablet (0.5 mg total) by mouth 2 (two) times daily for 15 days. (Patient not taking: Reported on 11/25/2018)  . montelukast (SINGULAIR) 10 MG tablet Take 1 tablet (10 mg  total) by mouth at bedtime. (Patient not taking: Reported on 09/22/2018)  . ramelteon (ROZEREM) 8 MG tablet Take 1 tablet (8 mg total) by mouth at bedtime. (Patient not taking: Reported on 09/22/2018)  . [DISCONTINUED] oxyCODONE-acetaminophen (PERCOCET) 10-325 MG tablet One tablet every 4-6 hours as needed.   No facility-administered encounter medications on file as of 11/25/2018.     Activities of Daily Living In your present state of health, do you have any difficulty performing the following activities: 11/25/2018 06/17/2018  Hearing? N N  Vision? Y N  Comment Needs a new eye glasses prescription. Pt plans to set up an eye exam this year. -  Difficulty concentrating or making decisions? N N  Walking or climbing stairs? Y Y  Comment Due to leg pains. -  Dressing or bathing? N Y  Doing errands, shopping? N Y  Conservation officer, nature and eating ? N -  Using the Toilet? N -  In the past six months, have you accidently leaked urine? N -  Do you have problems with loss of bowel control? N -  Managing your Medications? N -  Managing your Finances? N -  Housekeeping or managing your Housekeeping? N -  Some recent data might be hidden    Patient Care Team: Birdie Sons, MD as PCP - General (Family Medicine) Lorelee Cover., MD (Ophthalmology) Vladimir Crofts, MD as Consulting Physician (Neurology) Dionisio David, MD as Consulting Physician (Cardiology) Lloyd Huger, MD as Consulting Physician (Oncology) Noreene Filbert, MD as Referring Physician (Radiation Oncology)    Assessment:   This is a routine wellness examination for Karishma.  Exercise Activities and Dietary recommendations Current Exercise Habits: Home exercise routine, Type of exercise: stretching, Time (Minutes): 10, Frequency (Times/Week): 2, Weekly Exercise (Minutes/Week): 20, Intensity: Mild, Exercise limited by: orthopedic condition(s)  Goals      Patient Stated   . 'I am old and warn out and need  help finding a  place to go" (pt-stated)     Current Barriers:  . Level of care concerns  Clinical Social Work Clinical Goal(s):  [name] Over the next 90 days, client will work with SW to address concerns related to level of care concerns  Interventions: . Continued to explore patient's level of care needs . Confirmed that patient has now decided against facility care placement at this time . Contacted patient's provider to cancel request for the FL2 . Explored patient's current functioning and confirmed with patient her improved ability to take care of her own ADL's and IADL's with moderate assistance . Discussed with patient available network of support and confirmed that patient has hired a Actuary 3 days a week. She also discussed having a friend that will check on her regularly. . Discussed plans with patient for ongoing care management follow up if needed in the future and provided patient with direct contact information for care management team   Patient Self Care Activities:  . Performs ADL's independently . Performs IADL's independently  Please see past updates related to this goal by clicking on the "Past Updates" button in the selected goal        Other   . Prevent falls     Recommend to remove any items from the home that may cause slips or trips.        Fall Risk: Fall Risk  11/25/2018 11/20/2017 12/04/2016 10/01/2016 08/13/2016  Falls in the past year? 1 Yes No Yes No  Number falls in past yr: 1 2 or more - 2 or more -  Injury with Fall? 0 No - No -  Risk Factor Category  - High Fall Risk - High Fall Risk -  Risk for fall due to : - Impaired mobility;History of fall(s) - History of fall(s);Impaired balance/gait -  Follow up Falls prevention discussed Falls prevention discussed - Falls evaluation completed -    FALL RISK PREVENTION PERTAINING TO THE HOME:  Any stairs in or around the home? Yes  If so, are there any without handrails? No   Home free of loose throw rugs in walkways,  pet beds, electrical cords, etc? Yes  Adequate lighting in your home to reduce risk of falls? Yes   ASSISTIVE DEVICES UTILIZED TO PREVENT FALLS:  Life alert? No  Use of a cane, walker or w/c? Yes  Grab bars in the bathroom? No  Shower chair or bench in shower? No  Elevated toilet seat or a handicapped toilet? Yes   TIMED UP AND GO:  Was the test performed? No .    Depression Screen PHQ 2/9 Scores 11/25/2018 08/10/2018 11/20/2017 10/03/2017  PHQ - 2 Score 0 2 0 1  PHQ- 9 Score - _0 Cognitive Function: Declined today.      6CIT Screen 11/20/2017  What Year? 0 points  What month? 0 points  What time? 0 points  Count back from 20 0 points  Months in reverse 0 points  Repeat phrase 0 points  Total Score 0    Immunization History  Administered Date(s) Administered  . Influenza,inj,Quad PF,6+ Mos 12/13/2014, 11/23/2016, 10/13/2017  . Influenza-Unspecified 01/28/2014  . Pneumococcal Polysaccharide-23 02/24/2014    Qualifies for Shingles Vaccine? Yes . Due for Shingrix. Education has been provided regarding the importance of this vaccine. Pt has been advised to call insurance company to determine out of pocket expense. Advised may also receive vaccine at local pharmacy or Health Dept. Verbalized  acceptance and understanding.  Tdap: Although this vaccine is not a covered service during a Wellness Exam, does the patient still wish to receive this vaccine today?  No .   Flu Vaccine: Due for Flu vaccine. Does the patient want to receive this vaccine today?  No .  Pneumococcal Vaccine: Due for Pneumococcal vaccine. Does the patient want to receive this vaccine today?  No .   Screening Tests Health Maintenance  Topic Date Due  . Hepatitis C Screening  1955-08-12  . FOOT EXAM  10/01/1966  . OPHTHALMOLOGY EXAM  10/01/1966  . PAP SMEAR-Modifier  09/30/1977  . COLONOSCOPY  10/01/2006  . MAMMOGRAM  01/02/2015  . INFLUENZA VACCINE  08/29/2018  . TETANUS/TDAP  11/25/2019  (Originally 10/01/1975)  . HEMOGLOBIN A1C  03/25/2019  . URINE MICROALBUMIN  08/03/2019  . PNEUMOCOCCAL POLYSACCHARIDE VACCINE AGE 63-64 HIGH RISK  Completed  . HIV Screening  Completed    Cancer Screenings:  Colorectal Screening: Declined colonoscopy but is interested in completing the cologuard kit. Ordered today.   Mammogram: Completed 01/01/13. Ordered today. Pt provided with contact info and advised to call to schedule appt.   Lung Cancer Screening: (Low Dose CT Chest recommended if Age 47-80 years, 30 pack-year currently smoking OR have quit w/in 15years.) does qualify however had this completed 09/10/18. Repeat yearly.  Additional Screening:  Hepatitis C Screening: does qualify and would like this added to next blood work orders.  Dental Screening: Recommended annual dental exams for proper oral hygiene   Community Resource Referral:  CRR required this visit?  No       Plan:  I have personally reviewed and addressed the Medicare Annual Wellness questionnaire and have noted the following in the patient's chart:  A. Medical and social history B. Use of alcohol, tobacco or illicit drugs  C. Current medications and supplements D. Functional ability and status E.  Nutritional status F.  Physical activity G. Advance directives H. List of other physicians I.  Hospitalizations, surgeries, and ER visits in previous 12 months J.  [place] such as hearing and vision if needed, cognitive and depression L. Referrals and appointments   In addition, I have reviewed and discussed with patient certain preventive protocols, quality metrics, and best practice recommendations. A written personalized care plan for preventive services as well as general preventive health recommendations were provided to patient.   Glendora Score, [place]  [id number] Nurse Health Advisor   Nurse Notes: Pt needs a diabetic foot exam, Hep C lab order and flu shot at next in office  visit. Cologuard order mailed today. Pt to call and set up eye exam for this year.

## 2018-11-25 ENCOUNTER — Telehealth: Payer: Self-pay | Admitting: Family Medicine

## 2018-11-25 ENCOUNTER — Other Ambulatory Visit: Payer: Self-pay

## 2018-11-25 ENCOUNTER — Ambulatory Visit (INDEPENDENT_AMBULATORY_CARE_PROVIDER_SITE_OTHER): Payer: Medicare Other

## 2018-11-25 DIAGNOSIS — Z1211 Encounter for screening for malignant neoplasm of colon: Secondary | ICD-10-CM | POA: Diagnosis not present

## 2018-11-25 DIAGNOSIS — Z0000 Encounter for general adult medical examination without abnormal findings: Secondary | ICD-10-CM | POA: Diagnosis not present

## 2018-11-25 DIAGNOSIS — Z1231 Encounter for screening mammogram for malignant neoplasm of breast: Secondary | ICD-10-CM | POA: Diagnosis not present

## 2018-11-25 DIAGNOSIS — M5442 Lumbago with sciatica, left side: Secondary | ICD-10-CM

## 2018-11-25 DIAGNOSIS — G8929 Other chronic pain: Secondary | ICD-10-CM

## 2018-11-25 MED ORDER — OXYCODONE-ACETAMINOPHEN 10-325 MG PO TABS: ORAL_TABLET | ORAL | 0 refills | Status: DC

## 2018-11-25 NOTE — Telephone Encounter (Signed)
Pt was only able to get 41 filled of her oxyCODONE-acetaminophen (PERCOCET) 10-325 MG tablet of 180 filled today.  [facility] is needing the Rx written for the remaining amount of 139.  [place] (N), [place] [address] [phone] (Phone) [phone] (Fax)   Thanks, [employer]

## 2018-11-25 NOTE — Telephone Encounter (Signed)
° °  oxyCODONE-acetaminophen (PERCOCET) 10-325 MG tablet Was sent to the wrong pharmacy.  Send to:  [facility] Address: [address], [place], [place] [address]  Please call pt back to let her know this was done asap.   Thanks, [employer]

## 2018-11-25 NOTE — Telephone Encounter (Signed)
The prescription has been cancelled at the [facility] and a VM has been left with the patient of the correction of the medication being at the new pharmacy.

## 2018-11-25 NOTE — Telephone Encounter (Signed)
Have sent prescription to [facility] [address].  Please call [place], [address] can cancel prescription that was sent there.

## 2018-11-25 NOTE — Patient Instructions (Signed)
Yvette Riley , Thank you for taking time to come for your Medicare Wellness Visit. I appreciate your ongoing commitment to your health goals. Please review the following plan we discussed and let me know if I can assist you in the future.   Screening recommendations/referrals: Colonoscopy: Cologuard ordered today. Declined colonoscopy referral.  Mammogram: Ordered today. Pt aware she need to contact the imaging center to schedule apt.  Recommended yearly ophthalmology/optometry visit for glaucoma screening and checkup Recommended yearly dental visit for hygiene and checkup  Vaccinations: Influenza vaccine: Pt declines today.  Tdap vaccine: Pt declines today.  Shingles vaccine: Pt declines today.     Advanced directives: Advance directive discussed with you today. Even though you declined this today please call our office should you change your mind and we can give you the proper paperwork for you to fill out.  Conditions/risks identified: Fall risk prevention discussed today.   Next appointment: 02/23/18 @ 8:40 AM with Dr Caryn Section. Declined scheduling an AWV for 2021 at this time. Will need 60 minute appointment next year.   Preventive Care 40-64 Years, Female Preventive care refers to lifestyle choices and visits with your health care provider that can promote health and wellness. What does preventive care include?  A yearly physical exam. This is also called an annual well check.  Dental exams once or twice a year.  Routine eye exams. Ask your health care provider how often you should have your eyes checked.  Personal lifestyle choices, including:  Daily care of your teeth and gums.  Regular physical activity.  Eating a healthy diet.  Avoiding tobacco and drug use.  Limiting alcohol use.  Practicing safe sex.  Taking low-dose aspirin daily starting at age 70.  Taking vitamin and mineral supplements as recommended by your health care provider. What happens during an  annual well check? The services and screenings done by your health care provider during your annual well check will depend on your age, overall health, lifestyle risk factors, and family history of disease. Counseling  Your health care provider may ask you questions about your:  Alcohol use.  Tobacco use.  Drug use.  Emotional well-being.  Home and relationship well-being.  Sexual activity.  Eating habits.  Work and work Statistician.  Method of birth control.  Menstrual cycle.  Pregnancy history. Screening  You may have the following tests or measurements:  Height, weight, and BMI.  Blood pressure.  Lipid and cholesterol levels. These may be checked every 5 years, or more frequently if you are over 8 years old.  Skin check.  Lung cancer screening. You may have this screening every year starting at age 2 if you have a 30-pack-year history of smoking and currently smoke or have quit within the past 15 years.  Fecal occult blood test (FOBT) of the stool. You may have this test every year starting at age 17.  Flexible sigmoidoscopy or colonoscopy. You may have a sigmoidoscopy every 5 years or a colonoscopy every 10 years starting at age 64.  Hepatitis C blood test.  Hepatitis B blood test.  Sexually transmitted disease (STD) testing.  Diabetes screening. This is done by checking your blood sugar (glucose) after you have not eaten for a while (fasting). You may have this done every 1-3 years.  Mammogram. This may be done every 1-2 years. Talk to your health care provider about when you should start having regular mammograms. This may depend on whether you have a family history of breast cancer.  BRCA-related cancer screening. This may be done if you have a family history of breast, ovarian, tubal, or peritoneal cancers.  Pelvic exam and Pap test. This may be done every 3 years starting at age 91. Starting at age 79, this may be done every 5 years if you have a Pap  test in combination with an HPV test.  Bone density scan. This is done to screen for osteoporosis. You may have this scan if you are at high risk for osteoporosis. Discuss your test results, treatment options, and if necessary, the need for more tests with your health care provider. Vaccines  Your health care provider may recommend certain vaccines, such as:  Influenza vaccine. This is recommended every year.  Tetanus, diphtheria, and acellular pertussis (Tdap, Td) vaccine. You may need a Td booster every 10 years.  Zoster vaccine. You may need this after age 40.  Pneumococcal 13-valent conjugate (PCV13) vaccine. You may need this if you have certain conditions and were not previously vaccinated.  Pneumococcal polysaccharide (PPSV23) vaccine. You may need one or two doses if you smoke cigarettes or if you have certain conditions. Talk to your health care provider about which screenings and vaccines you need and how often you need them. This information is not intended to replace advice given to you by your health care provider. Make sure you discuss any questions you have with your health care provider. Document Released: 02/10/2015 Document Revised: 10/04/2015 Document Reviewed: 11/15/2014 Elsevier Interactive Patient Education  2017 [place] Prevention in the Home Falls can cause injuries. They can happen to people of all ages. There are many things you can do to make your home safe and to help prevent falls. What can I do on the outside of my home?  Regularly fix the edges of walkways and driveways and fix any cracks.  Remove anything that might make you trip as you walk through a door, such as a raised step or threshold.  Trim any bushes or trees on the path to your home.  Use bright outdoor lighting.  Clear any walking paths of anything that might make someone trip, such as rocks or tools.  Regularly check to see if handrails are loose or broken. Make sure that  both sides of any steps have handrails.  Any raised decks and porches should have guardrails on the edges.  Have any leaves, snow, or ice cleared regularly.  Use sand or salt on walking paths during winter.  Clean up any spills in your garage right away. This includes oil or grease spills. What can I do in the bathroom?  Use night lights.  Install grab bars by the toilet and in the tub and shower. Do not use towel bars as grab bars.  Use non-skid mats or decals in the tub or shower.  If you need to sit down in the shower, use a plastic, non-slip stool.  Keep the floor dry. Clean up any water that spills on the floor as soon as it happens.  Remove soap buildup in the tub or shower regularly.  Attach bath mats securely with double-sided non-slip rug tape.  Do not have throw rugs and other things on the floor that can make you trip. What can I do in the bedroom?  Use night lights.  Make sure that you have a light by your bed that is easy to reach.  Do not use any sheets or blankets that are too big for your bed. They  should not hang down onto the floor.  Have a firm chair that has side arms. You can use this for support while you get dressed.  Do not have throw rugs and other things on the floor that can make you trip. What can I do in the kitchen?  Clean up any spills right away.  Avoid walking on wet floors.  Keep items that you use a lot in easy-to-reach places.  If you need to reach something above you, use a strong step stool that has a grab bar.  Keep electrical cords out of the way.  Do not use floor polish or wax that makes floors slippery. If you must use wax, use non-skid floor wax.  Do not have throw rugs and other things on the floor that can make you trip. What can I do with my stairs?  Do not leave any items on the stairs.  Make sure that there are handrails on both sides of the stairs and use them. Fix handrails that are broken or loose. Make sure  that handrails are as long as the stairways.  Check any carpeting to make sure that it is firmly attached to the stairs. Fix any carpet that is loose or worn.  Avoid having throw rugs at the top or bottom of the stairs. If you do have throw rugs, attach them to the floor with carpet tape.  Make sure that you have a light switch at the top of the stairs and the bottom of the stairs. If you do not have them, ask someone to add them for you. What else can I do to help prevent falls?  Wear shoes that:  Do not have high heels.  Have rubber bottoms.  Are comfortable and fit you well.  Are closed at the toe. Do not wear sandals.  If you use a stepladder:  Make sure that it is fully opened. Do not climb a closed stepladder.  Make sure that both sides of the stepladder are locked into place.  Ask someone to hold it for you, if possible.  Clearly mark and make sure that you can see:  Any grab bars or handrails.  First and last steps.  Where the edge of each step is.  Use tools that help you move around (mobility aids) if they are needed. These include:  Canes.  Walkers.  Scooters.  Crutches.  Turn on the lights when you go into a dark area. Replace any light bulbs as soon as they burn out.  Set up your furniture so you have a clear path. Avoid moving your furniture around.  If any of your floors are uneven, fix them.  If there are any pets around you, be aware of where they are.  Review your medicines with your doctor. Some medicines can make you feel dizzy. This can increase your chance of falling. Ask your doctor what other things that you can do to help prevent falls. This information is not intended to replace advice given to you by your health care provider. Make sure you discuss any questions you have with your health care provider. Document Released: 11/10/2008 Document Revised: 06/22/2015 Document Reviewed: 02/18/2014 Elsevier Interactive Patient Education   2017 [employer].

## 2018-11-26 MED ORDER — OXYCODONE-ACETAMINOPHEN 10-325 MG PO TABS: ORAL_TABLET | ORAL | 0 refills | Status: AC

## 2018-12-03 NOTE — Progress Notes (Signed)
      Patient: Yvette Riley Female    DOB: 01/12/1956   63 y.o.   MRN: [id number] Visit Date: 12/04/2018  Today's Provider: Donald Fisher, MD   Chief Complaint  Patient presents with  . Leg Pain   Subjective:     Leg Pain  Incident onset: approximately 3 months ago when she had a stroke. There was no injury mechanism. The pain is present in the right leg, left leg, right hip and left hip. The quality of the pain is described as cramping, shooting and burning. The pain has been constant since onset. The symptoms are aggravated by movement and weight bearing. She has tried nothing for the symptoms.    Allergies  Allergen Reactions  . Codeine Anaphylaxis  . Other Anaphylaxis  . Penicillins Anaphylaxis, Rash and Other (See Comments)    Reaction:  Tongue swelling  Has patient had a PCN reaction causing immediate rash, facial/tongue/throat swelling, SOB or lightheadedness with hypotension:  Yes   Has patient had a PCN reaction causing severe rash involving mucus membranes or skin necrosis: No Has patient had a PCN reaction that required hospitalization No Has patient had a PCN reaction occurring within the last 10 years: No If all of the above answers are "NO", then may proceed with Cephalosporin use.  . Yellow Jacket Venom [Bee Venom] Anaphylaxis  . Crestor [Rosuvastatin] Nausea And Vomiting    Corrected prior adverse reaction per [facility] [employer] [facility]. On 12/02/3011  patient reported N & V when she takes Crestor  . Gemfibrozil Rash, Nausea And Vomiting and Swelling  . Trazodone And Nefazodone Nausea And Vomiting  . Lipitor [Atorvastatin] Nausea Only    By patient report 12/02/2011. Had also been prescribed pravastatin and lovastatin by previous MD. Unclear if those caused same side effects.      Current Outpatient Medications:  .  albuterol (PROVENTIL) (2.5 MG/3ML) 0.083% nebulizer solution, Take 3 mLs (2.5 mg total) by nebulization every 6 (six) hours as needed for wheezing or  shortness of breath., Disp: 75 mL, Rfl: 3 .  benzonatate (TESSALON PERLES) 100 MG capsule, Take 1 capsule (100 mg total) by mouth 3 (three) times daily as needed for cough., Disp: 60 capsule, Rfl: 3 .  clopidogrel (PLAVIX) 75 MG tablet, Take 1 tablet (75 mg total) by mouth daily., Disp: 30 tablet, Rfl: 3 .  ezetimibe (ZETIA) 10 MG tablet, Take 1 tablet (10 mg total) by mouth daily., Disp: 30 tablet, Rfl: 12 .  gabapentin (NEURONTIN) 600 MG tablet, Take one tablet up to 4 times a day, Disp: 120 tablet, Rfl: 5 .  glucose blood test strip, Contour test strip. Use to check blood sugar once a day for for type 2 diabetes (E11.9), Disp: 100 each, Rfl: 4 .  Lancets MISC, Use to check blood sugar once daily for type 2 diabetes E11.9, Disp: 100 each, Rfl: 4 .  meloxicam (MOBIC) 15 MG tablet, TAKE 1 TABLET BY MOUTH ONCE DAILY AS NEEDED FOR PAIN, Disp: 90 tablet, Rfl: 3 .  metFORMIN (GLUCOPHAGE-XR) 500 MG 24 hr tablet, Take 1 tablet (500 mg total) by mouth 2 (two) times daily with a meal., Disp: 60 tablet, Rfl: 12 .  metoprolol succinate (TOPROL-XL) 50 MG 24 hr tablet, Take 50 mg by mouth daily., Disp: , Rfl:  .  montelukast (SINGULAIR) 10 MG tablet, Take 1 tablet (10 mg total) by mouth at bedtime., Disp: 90 tablet, Rfl: 3 .  NARCAN 4 MG/0.1ML LIQD nasal spray kit, Place   0.4 mg into the nose once. , Disp: , Rfl: 99 .  oxyCODONE-acetaminophen (PERCOCET) 10-325 MG tablet, One tablet every 4-6 hours as needed., Disp: 180 tablet, Rfl: 0 .  oxyCODONE-acetaminophen (PERCOCET) 10-325 MG tablet, Take 1 tablet every 4-6 hours as needed, Disp: 139 tablet, Rfl: 0 .  PROAIR HFA 108 (90 Base) MCG/ACT inhaler, INHALE 2 PUFFS BY MOUTH EVERY 6 HOURS AS NEEDED FOR WHEEZING OR SHORTNESS OF BREATH, Disp: , Rfl: 3 .  sertraline (ZOLOFT) 25 MG tablet, Take 25 mg by mouth daily. , Disp: , Rfl:  .  sucralfate (CARAFATE) 1 g tablet, Take 1 tablet (1 g total) by mouth 4 (four) times daily -  with meals and at bedtime., Disp: 120 tablet,  Rfl: 4 .  umeclidinium-vilanterol (ANORO ELLIPTA) 62.5-25 MCG/INH AEPB, Inhale 1 puff into the lungs daily., Disp: 30 each, Rfl: 12 .  zaleplon (SONATA) 10 MG capsule, TAKE 1 CAPSULE BY MOUTH IN THE EVENING. MAY TAKE SECOND CAPSULE AFTER 2 HOURS IF NEEDED, Disp: 30 capsule, Rfl: 3 .  clonazePAM (KLONOPIN) 0.5 MG tablet, Take 1 tablet (0.5 mg total) by mouth 2 (two) times daily for 15 days. (Patient not taking: Reported on 11/25/2018), Disp: 30 tablet, Rfl: 0 .  ramelteon (ROZEREM) 8 MG tablet, Take 1 tablet (8 mg total) by mouth at bedtime. (Patient not taking: Reported on 09/22/2018), Disp: 30 tablet, Rfl: 3  Review of Systems  Constitutional: Negative.   Respiratory: Negative.   Cardiovascular: Negative.   Musculoskeletal: Negative.     Social History   Tobacco Use  . Smoking status: Former Smoker    Packs/day: 0.25    Years: 45.00    Pack years: 11.25    Types: Cigarettes    Quit date: 03/17/2016    Years since quitting: 2.7  . Smokeless tobacco: Never Used  . Tobacco comment: pt states she quit in 2016-2017  Substance Use Topics  . Alcohol use: No    Alcohol/week: 0.0 standard drinks      Objective:   BP 104/72 (BP Location: Right Arm, Patient Position: Sitting, Cuff Size: Large)   Pulse 80   Temp (!) 96.9 F (36.1 C) (Temporal)   Wt 177 lb (80.3 kg)   BMI 37.64 kg/m  Vitals:   12/04/18 0824  BP: 104/72  Pulse: 80  Temp: (!) 96.9 F (36.1 C)  TempSrc: Temporal  Weight: 177 lb (80.3 kg)  Body mass index is 37.64 kg/m.   Physical Exam  Diffusely tender back and sides of legs. Requires force of her arms to push herself up out of chair. Is able to take 5-6 steps without assistance but unsteady gait due to weakness.        Assessment & Plan    1. Other fatigue  - Comprehensive metabolic panel - TSH - CBC - Magnesium - CK (Creatine Kinase)  2. Weakness of both legs  - Comprehensive metabolic panel - TSH - CBC - Magnesium - CK (Creatine Kinase)   Consider neurology referral if labs normal.   3. Need for influenza vaccination  - Flu Vaccine QUAD 6+ mos PF IM (Fluarix Quad PF)  The entirety of the information documented in the History of Present Illness, Review of Systems and Physical Exam were personally obtained by me. Portions of this information were initially documented by Latasha Walston, CMA and reviewed by me for thoroughness and accuracy.     Donald Fisher, MD  [facility] 

## 2018-12-04 ENCOUNTER — Ambulatory Visit (INDEPENDENT_AMBULATORY_CARE_PROVIDER_SITE_OTHER): Payer: Medicare Other | Admitting: Family Medicine

## 2018-12-04 ENCOUNTER — Encounter: Payer: Self-pay | Admitting: Family Medicine

## 2018-12-04 ENCOUNTER — Other Ambulatory Visit: Payer: Self-pay

## 2018-12-04 VITALS — BP 104/72 | HR 80 | Temp 96.9°F | Wt 177.0 lb

## 2018-12-04 DIAGNOSIS — Z23 Encounter for immunization: Secondary | ICD-10-CM | POA: Diagnosis not present

## 2018-12-04 DIAGNOSIS — I639 Cerebral infarction, unspecified: Secondary | ICD-10-CM

## 2018-12-04 DIAGNOSIS — R29898 Other symptoms and signs involving the musculoskeletal system: Secondary | ICD-10-CM

## 2018-12-04 DIAGNOSIS — R5383 Other fatigue: Secondary | ICD-10-CM

## 2018-12-04 MED ORDER — SUCRALFATE 1 G PO TABS: 1.0000 g | ORAL_TABLET | Freq: Three times a day (TID) | ORAL | 4 refills | Status: DC

## 2018-12-04 NOTE — Patient Instructions (Signed)
.   Please review the attached list of medications and notify my office if there are any errors.   . Please bring all of your medications to every appointment so we can make sure that our medication list is the same as yours.   Please contact your eyecare professional to schedule a routine eye exam. I recommend seeing [facility] at [phone]

## 2018-12-04 NOTE — Progress Notes (Deleted)
{Method of visit:23308}   Patient: Yvette Riley Female    DOB: September 04, 1955   63 y.o.   MRN: [id number] Visit Date: 12/04/2018  Today's Provider: Lelon Huh, MD   Chief Complaint  Patient presents with  . Leg Pain   Subjective:     HPI  Allergies  Allergen Reactions  . Codeine Anaphylaxis  . Other Anaphylaxis  . Penicillins Anaphylaxis, Rash and Other (See Comments)    Reaction:  Tongue swelling  Has patient had a PCN reaction causing immediate rash, facial/tongue/throat swelling, SOB or lightheadedness with hypotension:  Yes   Has patient had a PCN reaction causing severe rash involving mucus membranes or skin necrosis: No Has patient had a PCN reaction that required hospitalization No Has patient had a PCN reaction occurring within the last 10 years: No If all of the above answers are "NO", then may proceed with Cephalosporin use.  . Yellow Jacket Venom [Bee Venom] Anaphylaxis  . Crestor [Rosuvastatin] Nausea And Vomiting    Corrected prior adverse reaction per [facility] [employer]. On 12/02/3011  patient reported N & V when she takes Crestor  . Gemfibrozil Rash, Nausea And Vomiting and Swelling  . Trazodone And Nefazodone Nausea And Vomiting  . Lipitor [Atorvastatin] Nausea Only    By patient report 12/02/2011. Had also been prescribed pravastatin and lovastatin by previous MD. Unclear if those caused same side effects.      Current Outpatient Medications:  .  albuterol (PROVENTIL) (2.5 MG/3ML) 0.083% nebulizer solution, Take 3 mLs (2.5 mg total) by nebulization every 6 (six) hours as needed for wheezing or shortness of breath., Disp: 75 mL, Rfl: 3 .  benzonatate (TESSALON PERLES) 100 MG capsule, Take 1 capsule (100 mg total) by mouth 3 (three) times daily as needed for cough., Disp: 60 capsule, Rfl: 3 .  clopidogrel (PLAVIX) 75 MG tablet, Take 1 tablet (75 mg total) by mouth daily., Disp: 30 tablet, Rfl: 3 .  ezetimibe (ZETIA) 10 MG tablet, Take 1 tablet (10 mg  total) by mouth daily., Disp: 30 tablet, Rfl: 12 .  gabapentin (NEURONTIN) 600 MG tablet, Take one tablet up to 4 times a day, Disp: 120 tablet, Rfl: 5 .  glucose blood test strip, Contour test strip. Use to check blood sugar once a day for for type 2 diabetes (E11.9), Disp: 100 each, Rfl: 4 .  Lancets MISC, Use to check blood sugar once daily for type 2 diabetes E11.9, Disp: 100 each, Rfl: 4 .  meloxicam (MOBIC) 15 MG tablet, TAKE 1 TABLET BY MOUTH ONCE DAILY AS NEEDED FOR PAIN, Disp: 90 tablet, Rfl: 3 .  metFORMIN (GLUCOPHAGE-XR) 500 MG 24 hr tablet, Take 1 tablet (500 mg total) by mouth 2 (two) times daily with a meal., Disp: 60 tablet, Rfl: 12 .  metoprolol succinate (TOPROL-XL) 50 MG 24 hr tablet, Take 50 mg by mouth daily., Disp: , Rfl:  .  montelukast (SINGULAIR) 10 MG tablet, Take 1 tablet (10 mg total) by mouth at bedtime., Disp: 90 tablet, Rfl: 3 .  NARCAN 4 MG/0.1ML LIQD nasal spray kit, Place 0.4 mg into the nose once. , Disp: , Rfl: 99 .  oxyCODONE-acetaminophen (PERCOCET) 10-325 MG tablet, One tablet every 4-6 hours as needed., Disp: 180 tablet, Rfl: 0 .  oxyCODONE-acetaminophen (PERCOCET) 10-325 MG tablet, Take 1 tablet every 4-6 hours as needed, Disp: 139 tablet, Rfl: 0 .  PROAIR HFA 108 (90 Base) MCG/ACT inhaler, INHALE 2 PUFFS BY MOUTH EVERY 6 HOURS AS  NEEDED FOR WHEEZING OR SHORTNESS OF BREATH, Disp: , Rfl: 3 .  sertraline (ZOLOFT) 25 MG tablet, Take 25 mg by mouth daily. , Disp: , Rfl:  .  sucralfate (CARAFATE) 1 g tablet, Take 1 tablet (1 g total) by mouth 4 (four) times daily -  with meals and at bedtime., Disp: 120 tablet, Rfl: 4 .  umeclidinium-vilanterol (ANORO ELLIPTA) 62.5-25 MCG/INH AEPB, Inhale 1 puff into the lungs daily., Disp: 30 each, Rfl: 12 .  zaleplon (SONATA) 10 MG capsule, TAKE 1 CAPSULE BY MOUTH IN THE EVENING. MAY TAKE SECOND CAPSULE AFTER 2 HOURS IF NEEDED, Disp: 30 capsule, Rfl: 3 .  clonazePAM (KLONOPIN) 0.5 MG tablet, Take 1 tablet (0.5 mg total) by mouth 2  (two) times daily for 15 days. (Patient not taking: Reported on 11/25/2018), Disp: 30 tablet, Rfl: 0 .  ramelteon (ROZEREM) 8 MG tablet, Take 1 tablet (8 mg total) by mouth at bedtime. (Patient not taking: Reported on 09/22/2018), Disp: 30 tablet, Rfl: 3  Review of Systems  Social History   Tobacco Use  . Smoking status: Former Smoker    Packs/day: 0.25    Years: 45.00    Pack years: 11.25    Types: Cigarettes    Quit date: 03/17/2016    Years since quitting: 2.7  . Smokeless tobacco: Never Used  . Tobacco comment: pt states she quit in 2016-2017  Substance Use Topics  . Alcohol use: No    Alcohol/week: 0.0 standard drinks      Objective:   BP 104/72 (BP Location: Right Arm, Patient Position: Sitting, Cuff Size: Large)   Pulse 80   Temp (!) 96.9 F (36.1 C) (Temporal)   Wt 177 lb (80.3 kg)   BMI 37.64 kg/m  Vitals:   12/04/18 0824  BP: 104/72  Pulse: 80  Temp: (!) 96.9 F (36.1 C)  TempSrc: Temporal  Weight: 177 lb (80.3 kg)  Body mass index is 37.64 kg/m.   Physical Exam   No results found for any visits on 12/04/18.     Assessment & Plan        Lelon Huh, MD  [facility]

## 2018-12-05 LAB — COMPREHENSIVE METABOLIC PANEL
ALT: 9 IU/L (ref 0–32)
AST: 14 IU/L (ref 0–40)
Albumin/Globulin Ratio: 1.8 (ref 1.2–2.2)
Albumin: 4.2 g/dL (ref 3.8–4.8)
Alkaline Phosphatase: 82 IU/L (ref 39–117)
BUN/Creatinine Ratio: 19 (ref 12–28)
BUN: 13 mg/dL (ref 8–27)
Bilirubin Total: 0.3 mg/dL (ref 0.0–1.2)
CO2: 25 mmol/L (ref 20–29)
Calcium: 9.2 mg/dL (ref 8.7–10.3)
Chloride: 102 mmol/L (ref 96–106)
Creatinine, Ser: 0.69 mg/dL (ref 0.57–1.00)
GFR calc Af Amer: 108 mL/min/{1.73_m2} (ref >59)
GFR calc non Af Amer: 94 mL/min/{1.73_m2} (ref >59)
Globulin, Total: 2.3 g/dL (ref 1.5–4.5)
Glucose: 126 mg/dL — ABNORMAL HIGH (ref 65–99)
Potassium: 4.7 mmol/L (ref 3.5–5.2)
Sodium: 141 mmol/L (ref 134–144)
Total Protein: 6.5 g/dL (ref 6.0–8.5)

## 2018-12-05 LAB — CBC
Hematocrit: 35.8 % (ref 34.0–46.6)
Hemoglobin: 12.1 g/dL (ref 11.1–15.9)
MCH: 31.2 pg (ref 26.6–33.0)
MCHC: 33.8 g/dL (ref 31.5–35.7)
MCV: 92 fL (ref 79–97)
Platelets: 210 10*3/uL (ref 150–450)
RBC: 3.88 x10E6/uL (ref 3.77–5.28)
RDW: 13.7 % (ref 11.7–15.4)
WBC: 7 10*3/uL (ref 3.4–10.8)

## 2018-12-05 LAB — CK: Total CK: 61 U/L (ref 32–182)

## 2018-12-05 LAB — MAGNESIUM: Magnesium: 1.8 mg/dL (ref 1.6–2.3)

## 2018-12-05 LAB — TSH: TSH: 3.05 u[IU]/mL (ref 0.450–4.500)

## 2018-12-07 ENCOUNTER — Telehealth: Payer: Self-pay

## 2018-12-07 NOTE — Telephone Encounter (Signed)
-----   Message from Birdie Sons, MD sent at 12/07/2018  7:53 AM EST ----- Labs all completely normal. Continue current medications.  Recommend referral to neurology for weakness of legs and history of CVA

## 2018-12-07 NOTE — Telephone Encounter (Signed)
LMTCB

## 2018-12-07 NOTE — Telephone Encounter (Signed)
Patient advised. She states she already sees neurology and has a follow up scheduled for December.

## 2018-12-11 DIAGNOSIS — I10 Essential (primary) hypertension: Secondary | ICD-10-CM | POA: Diagnosis not present

## 2018-12-11 DIAGNOSIS — R062 Wheezing: Secondary | ICD-10-CM | POA: Diagnosis not present

## 2018-12-11 DIAGNOSIS — I2510 Atherosclerotic heart disease of native coronary artery without angina pectoris: Secondary | ICD-10-CM | POA: Diagnosis not present

## 2018-12-11 DIAGNOSIS — E782 Mixed hyperlipidemia: Secondary | ICD-10-CM | POA: Diagnosis not present

## 2018-12-11 DIAGNOSIS — I6389 Other cerebral infarction: Secondary | ICD-10-CM | POA: Diagnosis not present

## 2018-12-11 DIAGNOSIS — R0602 Shortness of breath: Secondary | ICD-10-CM | POA: Diagnosis not present

## 2018-12-18 ENCOUNTER — Telehealth: Payer: Self-pay

## 2018-12-18 NOTE — Telephone Encounter (Signed)
Patient is requesting that all medications be transferred to [facility] on [address]. She states she had to move back to [place]. Medications were sent to [facility] in [place].

## 2018-12-18 NOTE — Telephone Encounter (Signed)
I tried calling patient to ask which specific medications she needed to be called in. Left message to call back. I also left detailed message advising patient to contact [facility] to see if they are able to transfer medications through their system.

## 2018-12-18 NOTE — Telephone Encounter (Signed)
fCopied from [place] [id number]. Topic: General - Other >> Dec 18, 2018 11:40 AM Rayann Heman wrote: Reason for CRM: pt is requesting a call back from [facility]. She states that she discuss getting all medication switched over to another pharmacy. Please advise

## 2018-12-21 DIAGNOSIS — R0602 Shortness of breath: Secondary | ICD-10-CM | POA: Diagnosis not present

## 2018-12-23 ENCOUNTER — Other Ambulatory Visit: Payer: Self-pay | Admitting: Family Medicine

## 2018-12-23 DIAGNOSIS — G8929 Other chronic pain: Secondary | ICD-10-CM

## 2018-12-23 DIAGNOSIS — M5442 Lumbago with sciatica, left side: Secondary | ICD-10-CM

## 2018-12-23 NOTE — Telephone Encounter (Signed)
Medication Refill - Medication: oxyCODONE-acetaminophen (PERCOCET) 10-325 MG tablet   Pt stated she knows it is a little early she just didn't want to forget    Has the patient contacted their pharmacy? No. (Agent: If no, request that the patient contact the pharmacy for the refill.) (Agent: If yes, when and what did the pharmacy advise?)  Preferred Pharmacy (with phone number or street name):  [facility] (N), [place] - [place] [phone] (Phone) [phone] (Fax)     Agent: Please be advised that RX refills may take up to 3 business days. We ask that you follow-up with your pharmacy.

## 2018-12-25 ENCOUNTER — Other Ambulatory Visit: Payer: Self-pay | Admitting: Family Medicine

## 2018-12-25 DIAGNOSIS — I2510 Atherosclerotic heart disease of native coronary artery without angina pectoris: Secondary | ICD-10-CM | POA: Diagnosis not present

## 2018-12-25 DIAGNOSIS — I10 Essential (primary) hypertension: Secondary | ICD-10-CM | POA: Diagnosis not present

## 2018-12-25 DIAGNOSIS — R0602 Shortness of breath: Secondary | ICD-10-CM | POA: Diagnosis not present

## 2018-12-25 DIAGNOSIS — I6389 Other cerebral infarction: Secondary | ICD-10-CM | POA: Diagnosis not present

## 2018-12-25 DIAGNOSIS — M5442 Lumbago with sciatica, left side: Secondary | ICD-10-CM

## 2018-12-25 DIAGNOSIS — E782 Mixed hyperlipidemia: Secondary | ICD-10-CM | POA: Diagnosis not present

## 2018-12-25 DIAGNOSIS — G8929 Other chronic pain: Secondary | ICD-10-CM

## 2018-12-25 NOTE — Telephone Encounter (Signed)
Copied from [place] [id number]. Topic: Quick Communication - Rx Refill/Question >> Dec 25, 2018  1:15 PM Yvette Rack wrote: Medication: oxyCODONE-acetaminophen (PERCOCET) 10-325 MG tablet  Has the patient contacted their pharmacy? no  Preferred Pharmacy (with phone number or street name): [facility] (N), [place] - [place] [phone] (Phone)  [phone] (Fax)  Agent: Please be advised that RX refills may take up to 3 business days. We ask that you follow-up with your pharmacy.

## 2018-12-25 NOTE — Telephone Encounter (Signed)
Requested medication (s) are due for refill today: yes  Requested medication (s) are on the active medication list: yes  Last refill: 11/25/2018  Future visit scheduled: yes  Notes to clinic:  Refill cannot be delegated    Requested Prescriptions  Pending Prescriptions Disp Refills   oxyCODONE-acetaminophen (PERCOCET) 10-325 MG tablet 180 tablet 0    Sig: One tablet every 4-6 hours as needed.     Not Delegated - Analgesics:  Opioid Agonist Combinations Failed - 12/25/2018  1:27 PM      Failed - This refill cannot be delegated      Passed - Urine Drug Screen completed in last 360 days.      Passed - Valid encounter within last 6 months    Recent Outpatient Visits          3 weeks ago Other fatigue   [facility] Birdie Sons, MD   3 months ago Type 2 diabetes mellitus with microalbuminuria, without long-term current use of insulin [facility])   [facility] Birdie Sons, MD   4 months ago Chronic, continuous use of opioids   [facility] Birdie Sons, MD   5 months ago Cerebellar infarct [facility])   [facility] Birdie Sons, MD   6 months ago Other fatigue   [facility] Birdie Sons, MD      Future Appointments            In 2 months Fisher, Kirstie Peri, MD [facility], [place]

## 2018-12-26 MED ORDER — OXYCODONE-ACETAMINOPHEN 10-325 MG PO TABS: ORAL_TABLET | ORAL | 0 refills | Status: DC

## 2018-12-28 ENCOUNTER — Other Ambulatory Visit: Payer: Self-pay | Admitting: Registered Nurse

## 2018-12-30 ENCOUNTER — Other Ambulatory Visit: Payer: Self-pay | Admitting: Cardiovascular Disease

## 2018-12-30 ENCOUNTER — Telehealth: Payer: Self-pay | Admitting: Family Medicine

## 2018-12-30 DIAGNOSIS — I2510 Atherosclerotic heart disease of native coronary artery without angina pectoris: Secondary | ICD-10-CM

## 2018-12-30 NOTE — Telephone Encounter (Signed)
[facility] faxed refill request for the following medications:  metoprolol succinate (TOPROL-XL) 50 MG 24 hr tablet    Please advise.

## 2018-12-30 NOTE — Telephone Encounter (Signed)
Dr. Caryn Section, this medication is listed in patient's chart as historical. Is this prescribed by you? Please advise.

## 2018-12-31 NOTE — Telephone Encounter (Signed)
I think this was prescribed by her cardiologist Dr. Humphrey Rolls or his NP Jake Bathe. Was either of their names on the fax?

## 2019-01-04 ENCOUNTER — Other Ambulatory Visit: Payer: Self-pay | Admitting: Cardiovascular Disease

## 2019-01-04 DIAGNOSIS — R9389 Abnormal findings on diagnostic imaging of other specified body structures: Secondary | ICD-10-CM

## 2019-01-04 NOTE — Telephone Encounter (Signed)
LMTCB to see if Cardiologist prescribes metoprolol. If so patient needs to request refills from Dr. Humphrey Rolls. Okay for PEC to advise.

## 2019-01-04 NOTE — Telephone Encounter (Signed)
Pt stated Dr. Caryn Section prescribed metoprolol years ago. Please advise.

## 2019-01-04 NOTE — Telephone Encounter (Signed)
Requested medication (s) are due for refill today:   Yes  Requested medication (s) are on the active medication list:   Yes  Future visit scheduled:   Yes   Last ordered: 09/18/2018   This medication was prescribed by a historical provider.     Not sure whether she is still taking this or not.   Requested Prescriptions  Pending Prescriptions Disp Refills   metoprolol succinate (TOPROL-XL) 50 MG 24 hr tablet       Sig: Take 1 tablet (50 mg total) by mouth daily.     Cardiovascular:  Beta Blockers Passed - 12/31/2018  8:20 AM      Passed - Last BP in normal range    BP Readings from Last 1 Encounters:  12/04/18 104/72         Passed - Last Heart Rate in normal range    Pulse Readings from Last 1 Encounters:  12/04/18 80         Passed - Valid encounter within last 6 months    Recent Outpatient Visits          1 month ago Other fatigue   [facility] Birdie Sons, MD   3 months ago Type 2 diabetes mellitus with microalbuminuria, without long-term current use of insulin [facility])   [facility] Birdie Sons, MD   5 months ago Chronic, continuous use of opioids   [facility] Birdie Sons, MD   6 months ago Cerebellar infarct [facility])   [facility] Birdie Sons, MD   7 months ago Other fatigue   [facility] Birdie Sons, MD      Future Appointments            In 1 month Fisher, Kirstie Peri, MD [facility], [place]

## 2019-01-05 MED ORDER — METOPROLOL SUCCINATE ER 25 MG PO TB24: 25.0000 mg | ORAL_TABLET | Freq: Every day | ORAL | 1 refills | Status: DC

## 2019-01-05 NOTE — Telephone Encounter (Signed)
Her BP has been I little low, I went ahead and refilled, but reduced the dose to 25mg .

## 2019-01-07 ENCOUNTER — Telehealth: Payer: Self-pay | Admitting: Oncology

## 2019-01-07 NOTE — Telephone Encounter (Signed)
Please send letter that patient needs to call for f/u if desired.

## 2019-01-07 NOTE — Telephone Encounter (Signed)
Pt left a VM that she needs to be seen back in clinic. Pt has No showed to last 3 appts. Please advise. Also we can never get her on the phone.

## 2019-01-13 ENCOUNTER — Encounter: Admission: RE | Admit: 2019-01-13 | Payer: Medicare HMO | Source: Ambulatory Visit

## 2019-01-14 ENCOUNTER — Ambulatory Visit
Admission: RE | Admit: 2019-01-14 | Discharge: 2019-01-14 | Disposition: A | Discharge status: Home / Self Care | Payer: Medicare HMO | Source: Ambulatory Visit | Attending: Cardiovascular Disease | Admitting: Cardiovascular Disease

## 2019-01-14 ENCOUNTER — Other Ambulatory Visit: Payer: Self-pay

## 2019-01-14 DIAGNOSIS — R9389 Abnormal findings on diagnostic imaging of other specified body structures: Secondary | ICD-10-CM

## 2019-01-14 NOTE — Progress Notes (Signed)
[place]  [phone]) [phone] [phone]) [phone]  ID: Yvette Riley OB: 11/16/1955  MR#: [id number]  HLK#:[id number]  Patient Care Team: Birdie Sons, MD as PCP - General (Family Medicine) Lorelee Cover., MD (Ophthalmology) Vladimir Crofts, MD as Consulting Physician (Neurology) Dionisio David, MD as Consulting Physician (Cardiology) Lloyd Huger, MD as Consulting Physician (Oncology) Noreene Filbert, MD as Referring Physician (Radiation Oncology)  CHIEF COMPLAINT: Clinical stage IIIB small cell lung cancer of the right lung.  INTERVAL HISTORY: Patient was last evaluated in clinic on March 12, 2018.  She has skipped or canceled multiple appointments in the interim.  She is referred back by her cardiology for concern of recurrence.  Patient reports she had a CVA this past summer and still has residual neurologic issues particularly with fine motor skills.  She otherwise feels well.  She has chronic shortness of breath as well as weakness and fatigue that are unchanged.  She denies any recent fevers or illnesses.  She has a good appetite and denies weight loss.  She denies any chest pain, cough, or hemoptysis.  She denies any nausea, vomiting, constipation, or diarrhea.  She denies any melena or hematochezia.  She has no urinary complaints.  Patient offers no further specific complaints today.  REVIEW OF SYSTEMS:   Review of Systems  Constitutional: Positive for malaise/fatigue. Negative for fever and weight loss.  Respiratory: Positive for shortness of breath. Negative for cough and hemoptysis.   Cardiovascular: Negative.  Negative for chest pain and leg swelling.  Gastrointestinal: Negative.  Negative for abdominal pain and constipation.  Genitourinary: Negative.  Negative for dysuria.  Musculoskeletal: Negative.  Negative for back pain and myalgias.  Skin: Negative.  Negative for rash.  Neurological: Positive for focal weakness and weakness.  Negative for sensory change and headaches.  Psychiatric/Behavioral: Negative.  The patient is not nervous/anxious.     As per HPI. Otherwise, a complete review of systems is negative.  PAST MEDICAL HISTORY: Past Medical History:  Diagnosis Date  . Allergy   . Anemia   . Anxiety   . Arthritis   . Asthma   . Blood transfusion without reported diagnosis   . C. difficile diarrhea 04/07/2016  . CAP (community acquired pneumonia) 03/21/2015  . Combined forms of age-related cataract of both eyes 05/06/2016  . COPD (chronic obstructive pulmonary disease) ([place])   . CVA (cerebral vascular accident) ([place]) 06/17/2018  . Diabetes mellitus without complication ([place])   . Displacement of lumbar intervertebral disc without myelopathy   . Emphysema of lung ([place])   . GERD (gastroesophageal reflux disease)   . Glaucoma   . History of chicken pox   . Hypertension   . Pneumonia 03/29/2015  . Sepsis ([place]) 03/17/2016  . Shortness of breath   . Small cell lung cancer ([place])     PAST SURGICAL HISTORY: Past Surgical History:  Procedure Laterality Date  . CESAREAN SECTION    . TUBAL LIGATION      FAMILY HISTORY: Family History  Problem Relation Age of Onset  . Heart failure Mother   . Heart disease Mother   . Stroke Mother   . Heart disease Brother   . COPD Brother   . Cancer Maternal Aunt        Breast Cancer    ADVANCED DIRECTIVES (Y/N):  N  HEALTH MAINTENANCE: Social History   Tobacco Use  . Smoking status: Former Smoker    Packs/day: 0.25    Years: 45.00  Pack years: 11.25    Types: Cigarettes    Quit date: 03/17/2016    Years since quitting: 2.8  . Smokeless tobacco: Never Used  . Tobacco comment: pt states she quit in 2016-2017  Substance Use Topics  . Alcohol use: No    Alcohol/week: 0.0 standard drinks  . Drug use: No     Colonoscopy:  PAP:  Bone density:  Lipid panel:  Allergies  Allergen Reactions  . Codeine Anaphylaxis  . Other Anaphylaxis  . Penicillins  Anaphylaxis, Rash and Other (See Comments)    Reaction:  Tongue swelling  Has patient had a PCN reaction causing immediate rash, facial/tongue/throat swelling, SOB or lightheadedness with hypotension:  Yes   Has patient had a PCN reaction causing severe rash involving mucus membranes or skin necrosis: No Has patient had a PCN reaction that required hospitalization No Has patient had a PCN reaction occurring within the last 10 years: No If all of the above answers are "NO", then may proceed with Cephalosporin use.  . [place] Venom [Bee Venom] Anaphylaxis  . Crestor [Rosuvastatin] Nausea And Vomiting    Corrected prior adverse reaction per [facility] [employer]. On 12/02/3011  patient reported N & V when she takes Crestor  . Gemfibrozil Rash, Nausea And Vomiting and Swelling  . Trazodone And Nefazodone Nausea And Vomiting  . Lipitor [Atorvastatin] Nausea Only    By patient report 12/02/2011. Had also been prescribed pravastatin and lovastatin by previous MD. Unclear if those caused same side effects.     Current Outpatient Medications  Medication Sig Dispense Refill  . albuterol (PROVENTIL) (2.5 MG/3ML) 0.083% nebulizer solution Take 3 mLs (2.5 mg total) by nebulization every 6 (six) hours as needed for wheezing or shortness of breath. 75 mL 3  . benzonatate (TESSALON PERLES) 100 MG capsule Take 1 capsule (100 mg total) by mouth 3 (three) times daily as needed for cough. 60 capsule 3  . clopidogrel (PLAVIX) 75 MG tablet Take 1 tablet (75 mg total) by mouth daily. 30 tablet 3  . ezetimibe (ZETIA) 10 MG tablet Take 1 tablet (10 mg total) by mouth daily. 30 tablet 12  . gabapentin (NEURONTIN) 600 MG tablet Take one tablet up to 4 times a day 120 tablet 5  . glucose blood test strip Contour test strip. Use to check blood sugar once a day for for type 2 diabetes (E11.9) 100 each 4  . Lancets MISC Use to check blood sugar once daily for type 2 diabetes E11.9 100 each 4  . meloxicam (MOBIC) 15 MG  tablet TAKE 1 TABLET BY MOUTH ONCE DAILY AS NEEDED FOR PAIN 90 tablet 3  . metFORMIN (GLUCOPHAGE-XR) 500 MG 24 hr tablet Take 1 tablet (500 mg total) by mouth 2 (two) times daily with a meal. 60 tablet 12  . metoprolol succinate (TOPROL-XL) 25 MG 24 hr tablet Take 1 tablet (25 mg total) by mouth daily. 90 tablet 1  . montelukast (SINGULAIR) 10 MG tablet Take 1 tablet (10 mg total) by mouth at bedtime. 90 tablet 3  . NARCAN 4 MG/0.1ML LIQD nasal spray kit Place 0.4 mg into the nose once.   99  . oxyCODONE-acetaminophen (PERCOCET) 10-325 MG tablet One tablet every 4-6 hours as needed. 180 tablet 0  . PROAIR HFA 108 (90 Base) MCG/ACT inhaler INHALE 2 PUFFS BY MOUTH EVERY 6 HOURS AS NEEDED FOR WHEEZING OR SHORTNESS OF BREATH  3  . sertraline (ZOLOFT) 25 MG tablet Take 25 mg by mouth daily.     [name]  zaleplon (SONATA) 10 MG capsule TAKE 1 CAPSULE BY MOUTH IN THE EVENING. MAY TAKE SECOND CAPSULE AFTER 2 HOURS IF NEEDED 30 capsule 3  . clonazePAM (KLONOPIN) 0.5 MG tablet Take 1 tablet (0.5 mg total) by mouth 2 (two) times daily for 15 days. (Patient not taking: Reported on 11/25/2018) 30 tablet 0  . ramelteon (ROZEREM) 8 MG tablet Take 1 tablet (8 mg total) by mouth at bedtime. (Patient not taking: Reported on 01/15/2019) 30 tablet 3  . sucralfate (CARAFATE) 1 g tablet TAKE 1 TABLET BY MOUTH 4 TIMES DAILY WITH MEALS AND AT BEDTIME 360 tablet 0  . umeclidinium-vilanterol (ANORO ELLIPTA) 62.5-25 MCG/INH AEPB Inhale 1 puff into the lungs daily. (Patient not taking: Reported on 01/15/2019) 30 each 12   No current facility-administered medications for this visit.    OBJECTIVE: Vitals:   01/18/19 0937  BP: 126/86  Pulse: 98  Resp: 20  Temp: 98 F (36.7 C)  SpO2: 98%     Body mass index is 36.79 kg/m.    ECOG FS:0 - Asymptomatic  General: Well-developed, well-nourished, no acute distress.  Sitting in a wheelchair. Eyes: Pink conjunctiva, anicteric sclera. HEENT: Normocephalic, moist mucous  membranes. Lungs: No audible wheezing or coughing. Heart: Regular rate and rhythm. Abdomen: Soft, nontender, no obvious distention. Musculoskeletal: No edema, cyanosis, or clubbing. Neuro: Alert, answering all questions appropriately. Cranial nerves grossly intact. Skin: No rashes or petechiae noted. Psych: Normal affect.   LAB RESULTS:  Lab Results  Component Value Date   NA 141 12/04/2018   K 4.7 12/04/2018   CL 102 12/04/2018   CO2 25 12/04/2018   GLUCOSE 126 (H) 12/04/2018   BUN 13 12/04/2018   CREATININE 0.69 12/04/2018   CALCIUM 9.2 12/04/2018   PROT 6.5 12/04/2018   ALBUMIN 4.2 12/04/2018   AST 14 12/04/2018   ALT 9 12/04/2018   ALKPHOS 82 12/04/2018   BILITOT 0.3 12/04/2018   GFRNONAA 94 12/04/2018   GFRAA 108 12/04/2018    Lab Results  Component Value Date   WBC 7.0 12/04/2018   NEUTROABS 7.2 06/17/2018   HGB 12.1 12/04/2018   HCT 35.8 12/04/2018   MCV 92 12/04/2018   PLT 210 12/04/2018     STUDIES: No results found.  ASSESSMENT: Clinical stage IIIB small cell lung cancer of the right lung.  PLAN:    1. Clinical stage IIIB small cell lung cancer of the right lung: Patient completed concurrent chemotherapy and XRT at outside facility and then transferred care to [facility] to complete treatment and long-term monitoring for recurrence. She completed cycle 6 of carboplatinum and etoposide on August 15, 2016.  She also completed PCI.  Her most recent CT scan of her chest on September 10, 2018 revealed 2 new small lower lobe pulmonary nodules which were nonspecific the largest in the right lower lobe measuring 5 mm.  Otherwise, there was no evidence of recurrent or progressive disease.  Patient already has a PET scan scheduled for January 27, 2019, therefore will follow up 1 day later to discuss the results.  If PET is positive, will pursue biopsy to confirm diagnosis.  If PET is negative no further follow-up is necessary and will likely repeat imaging with CT scan in 6  months. 2.  Cough/shortness of breath: Continue follow-up with pulmonology and cardiology as scheduled. 3.  Back pain: Patient does not complain of this today.   I spent a total of 25 minutes face-to-face with the patient and reviewing chart data of which  greater than 50% of the visit was spent in counseling and coordination of care as detailed above.   Patient expressed understanding and was in agreement with this plan. She also understands that She can call clinic at any time with any questions, concerns, or complaints.   Cancer Staging Small cell lung cancer, right [facility]) Staging form: Lung, AJCC 8th Edition - Clinical stage from 08/18/2016: Stage IIIB (cT2a, cN3, cM0) - Signed by Lloyd Huger, MD on 08/18/2016   Lloyd Huger, MD   01/18/2019 10:35 AM

## 2019-01-15 ENCOUNTER — Other Ambulatory Visit: Payer: Self-pay

## 2019-01-15 NOTE — Progress Notes (Signed)
Patient pre screened for office appointment, no questions or concerns today. Patient reminded of upcoming appointment time and date. Patient is a poor historian regarding which medications she is taking. Suggest asking her to bring all her medications in for her next visit.

## 2019-01-16 ENCOUNTER — Other Ambulatory Visit: Payer: Self-pay | Admitting: Family Medicine

## 2019-01-18 ENCOUNTER — Other Ambulatory Visit: Payer: Self-pay

## 2019-01-18 ENCOUNTER — Inpatient Hospital Stay: Payer: Medicare HMO | Attending: Oncology | Admitting: Oncology

## 2019-01-18 ENCOUNTER — Telehealth: Payer: Self-pay | Admitting: Family Medicine

## 2019-01-18 VITALS — BP 126/86 | HR 98 | Temp 98.0°F | Resp 20 | Wt 173.0 lb

## 2019-01-18 DIAGNOSIS — J449 Chronic obstructive pulmonary disease, unspecified: Secondary | ICD-10-CM | POA: Insufficient documentation

## 2019-01-18 DIAGNOSIS — Z7984 Long term (current) use of oral hypoglycemic drugs: Secondary | ICD-10-CM | POA: Insufficient documentation

## 2019-01-18 DIAGNOSIS — I10 Essential (primary) hypertension: Secondary | ICD-10-CM | POA: Insufficient documentation

## 2019-01-18 DIAGNOSIS — C3491 Malignant neoplasm of unspecified part of right bronchus or lung: Secondary | ICD-10-CM

## 2019-01-18 DIAGNOSIS — Z79899 Other long term (current) drug therapy: Secondary | ICD-10-CM | POA: Insufficient documentation

## 2019-01-18 DIAGNOSIS — Z8673 Personal history of transient ischemic attack (TIA), and cerebral infarction without residual deficits: Secondary | ICD-10-CM | POA: Insufficient documentation

## 2019-01-18 DIAGNOSIS — E119 Type 2 diabetes mellitus without complications: Secondary | ICD-10-CM | POA: Diagnosis not present

## 2019-01-18 NOTE — Telephone Encounter (Signed)
[facility] faxed request for the following medications:  sucralfate (CARAFATE) 1 g tablet - wanting a 90 day supply    Please advise.  Thanks, [employer]

## 2019-01-18 NOTE — Progress Notes (Signed)
Pt here stating "my heart doctor told me my cancer is back, I need Dr. Grayland Ormond to tell me that." Pt c/o generalized pain.

## 2019-01-18 NOTE — Telephone Encounter (Signed)
RX was sent on 12/20 for a 90 day supply.

## 2019-01-19 ENCOUNTER — Encounter
Admission: RE | Admit: 2019-01-19 | Discharge: 2019-01-19 | Disposition: A | Discharge status: Home / Self Care | Payer: Medicare HMO | Source: Ambulatory Visit | Attending: Cardiovascular Disease | Admitting: Cardiovascular Disease

## 2019-01-19 DIAGNOSIS — I2510 Atherosclerotic heart disease of native coronary artery without angina pectoris: Secondary | ICD-10-CM

## 2019-01-19 MED ORDER — TECHNETIUM TC 99M TETROFOSMIN IV KIT
29.9910 | PACK | Freq: Once | INTRAVENOUS | Status: AC | PRN
  Administered 2019-01-19: 12:00:00 29.991 via INTRAVENOUS

## 2019-01-19 MED ORDER — TECHNETIUM TC 99M TETROFOSMIN IV KIT
10.0000 | PACK | Freq: Once | INTRAVENOUS | Status: AC | PRN
  Administered 2019-01-19: 10:00:00 10.023 via INTRAVENOUS

## 2019-01-19 MED ORDER — REGADENOSON 0.4 MG/5ML IV SOLN
0.4000 mg | Freq: Once | INTRAVENOUS | Status: AC
  Administered 2019-01-19: 12:00:00 0.4 mg via INTRAVENOUS

## 2019-01-21 NOTE — Progress Notes (Signed)
[place]  [phone] [phone]  ID: [id number] PAT OB: 1955-09-20  MR#: [id number]  YTK#:[id number]  Patient Care Team: Birdie Sons, MD as PCP - General (Family Medicine) Lorelee Cover., MD (Ophthalmology) Vladimir Crofts, MD as Consulting Physician (Neurology) Dionisio David, MD as Consulting Physician (Cardiology) Lloyd Huger, MD as Consulting Physician (Oncology) Noreene Filbert, MD as Referring Physician (Radiation Oncology)  CHIEF COMPLAINT: Clinical stage IIIB small cell lung cancer of the right lung.  INTERVAL HISTORY: Patient returns to clinic today for further evaluation and discussion of her PET scan results. She continues to have residual neurologic complaints particularly with fine motor skills from her CVA this past year. She has chronic shortness of breath as well as weakness and fatigue that are unchanged.  She denies any recent fevers or illnesses.  She has a good appetite and denies weight loss.  She denies any chest pain, cough, or hemoptysis.  She denies any nausea, vomiting, constipation, or diarrhea.  She denies any melena or hematochezia.  She has no urinary complaints. Patient offers no further specific complaints today.  REVIEW OF SYSTEMS:   Review of Systems  Constitutional: Positive for malaise/fatigue. Negative for fever and weight loss.  Respiratory: Positive for shortness of breath. Negative for cough and hemoptysis.   Cardiovascular: Negative.  Negative for chest pain and leg swelling.  Gastrointestinal: Negative.  Negative for abdominal pain and constipation.  Genitourinary: Negative.  Negative for dysuria.  Musculoskeletal: Negative.  Negative for back pain and myalgias.  Skin: Negative.  Negative for rash.  Neurological: Positive for focal weakness and weakness. Negative for sensory change and headaches.  Psychiatric/Behavioral: Negative.  The patient is not nervous/anxious.     As per HPI.  Otherwise, a complete review of systems is negative.  PAST MEDICAL HISTORY: Past Medical History:  Diagnosis Date  . Allergy   . Anemia   . Anxiety   . Arthritis   . Asthma   . Blood transfusion without reported diagnosis   . C. difficile diarrhea 04/07/2016  . CAP (community acquired pneumonia) 03/21/2015  . Combined forms of age-related cataract of both eyes 05/06/2016  . COPD (chronic obstructive pulmonary disease) ([place])   . CVA (cerebral vascular accident) ([place]) 06/17/2018  . Diabetes mellitus without complication ([place])   . Displacement of lumbar intervertebral disc without myelopathy   . Emphysema of lung ([place])   . GERD (gastroesophageal reflux disease)   . Glaucoma   . History of chicken pox   . Hypertension   . Pneumonia 03/29/2015  . Sepsis ([place]) 03/17/2016  . Shortness of breath   . Small cell lung cancer ([place])     PAST SURGICAL HISTORY: Past Surgical History:  Procedure Laterality Date  . CESAREAN SECTION    . TUBAL LIGATION      FAMILY HISTORY: Family History  Problem Relation Age of Onset  . Heart failure Mother   . Heart disease Mother   . Stroke Mother   . Heart disease Brother   . COPD Brother   . Cancer Maternal Aunt        Breast Cancer    ADVANCED DIRECTIVES (Y/N):  N  HEALTH MAINTENANCE: Social History   Tobacco Use  . Smoking status: Former Smoker    Packs/day: 0.25    Years: 45.00    Pack years: 11.25    Types: Cigarettes    Quit date: 03/17/2016    Years since quitting: 2.8  . Smokeless tobacco:  Never Used  . Tobacco comment: pt states she quit in 2016-2017  Substance Use Topics  . Alcohol use: No    Alcohol/week: 0.0 standard drinks  . Drug use: No     Colonoscopy:  PAP:  Bone density:  Lipid panel:  Allergies  Allergen Reactions  . Codeine Anaphylaxis  . Other Anaphylaxis  . Penicillins Anaphylaxis, Rash and Other (See Comments)    Reaction:  Tongue swelling  Has patient had a PCN reaction causing immediate rash,  facial/tongue/throat swelling, SOB or lightheadedness with hypotension:  Yes   Has patient had a PCN reaction causing severe rash involving mucus membranes or skin necrosis: No Has patient had a PCN reaction that required hospitalization No Has patient had a PCN reaction occurring within the last 10 years: No If all of the above answers are "NO", then may proceed with Cephalosporin use.  . [place] Venom [Bee Venom] Anaphylaxis  . Crestor [Rosuvastatin] Nausea And Vomiting    Corrected prior adverse reaction per [facility] [employer]. On 12/02/3011  patient reported N & V when she takes Crestor  . Gemfibrozil Rash, Nausea And Vomiting and Swelling  . Trazodone And Nefazodone Nausea And Vomiting  . Lipitor [Atorvastatin] Nausea Only    By patient report 12/02/2011. Had also been prescribed pravastatin and lovastatin by previous MD. Unclear if those caused same side effects.     Current Outpatient Medications  Medication Sig Dispense Refill  . albuterol (PROVENTIL) (2.5 MG/3ML) 0.083% nebulizer solution Take 3 mLs (2.5 mg total) by nebulization every 6 (six) hours as needed for wheezing or shortness of breath. 75 mL 3  . benzonatate (TESSALON PERLES) 100 MG capsule Take 1 capsule (100 mg total) by mouth 3 (three) times daily as needed for cough. 60 capsule 3  . clonazePAM (KLONOPIN) 0.5 MG tablet Take 1 tablet (0.5 mg total) by mouth 2 (two) times daily for 15 days. (Patient not taking: Reported on 11/25/2018) 30 tablet 0  . clopidogrel (PLAVIX) 75 MG tablet Take 1 tablet (75 mg total) by mouth daily. 30 tablet 3  . ezetimibe (ZETIA) 10 MG tablet Take 1 tablet (10 mg total) by mouth daily. 30 tablet 12  . furosemide (LASIX) 20 MG tablet     . gabapentin (NEURONTIN) 600 MG tablet Take one tablet up to 4 times a day 120 tablet 5  . glucose blood test strip Contour test strip. Use to check blood sugar once a day for for type 2 diabetes (E11.9) 100 each 4  . ipratropium-albuterol (DUONEB) 0.5-2.5  (3) MG/3ML SOLN 3 ml    . Lancets MISC Use to check blood sugar once daily for type 2 diabetes E11.9 100 each 4  . lisinopril (ZESTRIL) 10 MG tablet     . meloxicam (MOBIC) 15 MG tablet TAKE 1 TABLET BY MOUTH ONCE DAILY AS NEEDED FOR PAIN 90 tablet 3  . metFORMIN (GLUCOPHAGE-XR) 500 MG 24 hr tablet Take 1 tablet (500 mg total) by mouth 2 (two) times daily with a meal. 60 tablet 12  . metoprolol succinate (TOPROL-XL) 25 MG 24 hr tablet Take 1 tablet (25 mg total) by mouth daily. 90 tablet 1  . montelukast (SINGULAIR) 10 MG tablet Take 1 tablet (10 mg total) by mouth at bedtime. 90 tablet 3  . NARCAN 4 MG/0.1ML LIQD nasal spray kit Place 0.4 mg into the nose once.   99  . oxyCODONE-acetaminophen (PERCOCET) 10-325 MG tablet One tablet every 4-6 hours as needed. 180 tablet 0  .  PROAIR HFA 108 (90 Base) MCG/ACT inhaler INHALE 2 PUFFS BY MOUTH EVERY 6 HOURS AS NEEDED FOR WHEEZING OR SHORTNESS OF BREATH  3  . ramelteon (ROZEREM) 8 MG tablet Take 1 tablet (8 mg total) by mouth at bedtime. (Patient not taking: Reported on 01/15/2019) 30 tablet 3  . sertraline (ZOLOFT) 25 MG tablet Take 25 mg by mouth daily.     . sucralfate (CARAFATE) 1 g tablet TAKE 1 TABLET BY MOUTH 4 TIMES DAILY WITH MEALS AND AT BEDTIME 360 tablet 0  . umeclidinium-vilanterol (ANORO ELLIPTA) 62.5-25 MCG/INH AEPB Inhale 1 puff into the lungs daily. (Patient not taking: Reported on 01/15/2019) 30 each 12  . zaleplon (SONATA) 10 MG capsule TAKE 1 CAPSULE BY MOUTH IN THE EVENING. MAY TAKE SECOND CAPSULE AFTER 2 HOURS IF NEEDED 30 capsule 2   No current facility-administered medications for this visit.    OBJECTIVE: Vitals:   01/28/19 0938  BP: 101/67  Pulse: 100  Resp: 20  Temp: (!) 97.4 F (36.3 C)  SpO2: 98%     Body mass index is 38.23 kg/m.    ECOG FS:0 - Asymptomatic  General: Well-developed, well-nourished, no acute distress. Sitting in a wheelchair. Eyes: Pink conjunctiva, anicteric sclera. HEENT: Normocephalic, moist  mucous membranes. Lungs: No audible wheezing or coughing. Heart: Regular rate and rhythm. Abdomen: Soft, nontender, no obvious distention. Musculoskeletal: No edema, cyanosis, or clubbing. Neuro: Alert, answering all questions appropriately. Cranial nerves grossly intact. Skin: No rashes or petechiae noted. Psych: Normal affect.  LAB RESULTS:  Lab Results  Component Value Date   NA 141 12/04/2018   K 4.7 12/04/2018   CL 102 12/04/2018   CO2 25 12/04/2018   GLUCOSE 126 (H) 12/04/2018   BUN 13 12/04/2018   CREATININE 0.69 12/04/2018   CALCIUM 9.2 12/04/2018   PROT 6.5 12/04/2018   ALBUMIN 4.2 12/04/2018   AST 14 12/04/2018   ALT 9 12/04/2018   ALKPHOS 82 12/04/2018   BILITOT 0.3 12/04/2018   GFRNONAA 94 12/04/2018   GFRAA 108 12/04/2018    Lab Results  Component Value Date   WBC 7.0 12/04/2018   NEUTROABS 7.2 06/17/2018   HGB 12.1 12/04/2018   HCT 35.8 12/04/2018   MCV 92 12/04/2018   PLT 210 12/04/2018     STUDIES: NM Myocar Multi W/Spect W/Wall Motion / EF  Result Date: 01/25/2019  The study is normal.  This is a low risk study.  Nuclear stress EF: 64%.  The left ventricular ejection fraction is normal (55-65%).    NM PET Image Restag (PS) Skull Base To Thigh  Result Date: 01/27/2019 CLINICAL DATA:  Subsequent treatment strategy for lung cancer. EXAM: NUCLEAR MEDICINE PET SKULL BASE TO THIGH TECHNIQUE: 9.23 mCi F-18 FDG was injected intravenously. Full-ring PET imaging was performed from the skull base to thigh after the radiotracer. CT data was obtained and used for attenuation correction and anatomic localization. Fasting blood glucose: 132 mg/dl COMPARISON:  CT chest 09/10/2018 and PET-CT 09/24/2016. FINDINGS: Mediastinal blood pool activity: SUV max 3.16 Liver activity: SUV max NA NECK: Scratch set asymmetric muscular uptake localizes to the right side of jaw. No hypermetabolic mass or adenopathy identified. Incidental CT findings: none CHEST: No FDG avid  axillary, supraclavicular, mediastinal, or hilar lymph nodes. Paramediastinal radiation change within the right midlung is noted including a bandlike area of masslike architectural distortion, fibrosis and ground-glass attenuation. No hypermetabolic pulmonary nodules are identified. The recently identified right lower lobe lung nodules, nonspecific, are not confidently seen  on today's study favoring of postinflammatory or infectious change. Incidental CT findings: Coronary artery calcifications. ABDOMEN/PELVIS: No abnormal tracer uptake within the liver, pancreas, spleen, or adrenal glands. No FDG avid abdominopelvic lymph nodes. Incidental CT findings: Aortic atherosclerosis. SKELETON: No focal hypermetabolic activity to suggest skeletal metastasis. Incidental CT findings: none IMPRESSION: 1. No FDG avid mass or adenopathy identified to suggest residual or recurrent disease. 2. Coronary artery calcifications 3.  Aortic Atherosclerosis (ICD10-I70.0). Electronically Signed   By: Kerby Moors M.D.   On: 01/27/2019 12:46    ASSESSMENT: Clinical stage IIIB small cell lung cancer of the right lung.  PLAN:    1. Clinical stage IIIB small cell lung cancer of the right lung: Patient completed concurrent chemotherapy and XRT at outside facility and then transferred care to [facility] to complete treatment and long-term monitoring for recurrence. She completed cycle 6 of carboplatinum and etoposide on August 15, 2016.  She also completed PCI. Her most recent CT scan of her chest on September 10, 2018 revealed 2 new small lower lobe pulmonary nodules which were nonspecific the largest in the right lower lobe measuring 5 mm. Follow-up PET scan on January 27, 2019 revealed no FDG avid mass or adenopathy suggestive of residual or recurrent disease. No intervention is needed at this time. Patient is greater than 2-1/2 years out from completing her treatment and can return to clinic in 1 year with repeat imaging and further  evaluation. 2.  Cough/shortness of breath: Chronic and unchanged. Continue follow-up with pulmonology and cardiology as scheduled. 3.  Back pain: Chronic and unchanged. Patient does not complain of this today.    Patient expressed understanding and was in agreement with this plan. She also understands that She can call clinic at any time with any questions, concerns, or complaints.   Cancer Staging Small cell lung cancer, right [facility]) Staging form: Lung, AJCC 8th Edition - Clinical stage from 08/18/2016: Stage IIIB (cT2a, cN3, cM0) - Signed by Lloyd Huger, MD on 08/18/2016   Lloyd Huger, MD   01/29/2019 8:57 AM

## 2019-01-25 ENCOUNTER — Other Ambulatory Visit: Payer: Self-pay | Admitting: Family Medicine

## 2019-01-25 LAB — NM MYOCAR MULTI W/SPECT W/WALL MOTION / EF
Estimated workload: 1 METS
Exercise duration (min): 1 min
Exercise duration (sec): 0 s
LV dias vol: 36 mL (ref 46–106)
LV sys vol: 13 mL
MPHR: 158 {beats}/min
Peak HR: 121 {beats}/min
Percent HR: 76 %
Rest HR: 85 {beats}/min
SDS: 1
SRS: 1
SSS: 0
TID: 0.78

## 2019-01-25 NOTE — Telephone Encounter (Signed)
Requested medication (s) are due for refill today: yes  Requested medication (s) are on the active medication list: yes  Last refill:  05/27/2018  Future visit scheduled: yes  Notes to clinic:  This refill cannot be delegated    Requested Prescriptions  Pending Prescriptions Disp Refills   zaleplon (SONATA) 10 MG capsule [Pharmacy Med Name: Zaleplon 10 MG Oral Capsule] 30 capsule 0    Sig: TAKE 1 CAPSULE BY MOUTH IN THE EVENING. MAY TAKE SECOND CAPSULE AFTER 2 HOURS IF NEEDED      Not Delegated - Psychiatry:  Anxiolytics/Hypnotics Failed - 01/25/2019 12:48 PM      Failed - This refill cannot be delegated      Passed - Urine Drug Screen completed in last 360 days.      Passed - Valid encounter within last 6 months    Recent Outpatient Visits           1 month ago Other fatigue   [facility] Birdie Sons, MD   4 months ago Type 2 diabetes mellitus with microalbuminuria, without long-term current use of insulin [facility])   [facility] Birdie Sons, MD   5 months ago Chronic, continuous use of opioids   [facility] Birdie Sons, MD   6 months ago Cerebellar infarct [facility])   [facility] Birdie Sons, MD   7 months ago Other fatigue   [facility] Birdie Sons, MD       Future Appointments             In 1 month Fisher, Kirstie Peri, MD [facility], [place]

## 2019-01-25 NOTE — Telephone Encounter (Signed)
Pt called saying she needs a refill on    Percocet 10-325   Gabapentin 600 mg  [address]  CB#  [phone]

## 2019-01-26 ENCOUNTER — Other Ambulatory Visit: Payer: Self-pay | Admitting: Family Medicine

## 2019-01-26 DIAGNOSIS — G8929 Other chronic pain: Secondary | ICD-10-CM

## 2019-01-26 NOTE — Telephone Encounter (Signed)
Requested medication (s) are due for refill today: yes  Requested medication (s) are on the active medication list: yes  Last refill:  12/26/2018  Future visit scheduled: yes  Notes to clinic:  Patient only has 6 pills left This refill cannot be delegated    Requested Prescriptions  Pending Prescriptions Disp Refills   oxyCODONE-acetaminophen (PERCOCET) 10-325 MG tablet 180 tablet 0    Sig: One tablet every 4-6 hours as needed.      Not Delegated - Analgesics:  Opioid Agonist Combinations Failed - 01/26/2019  8:38 AM      Failed - This refill cannot be delegated      Passed - Urine Drug Screen completed in last 360 days.      Passed - Valid encounter within last 6 months    Recent Outpatient Visits           1 month ago Other fatigue   [facility] Birdie Sons, MD   4 months ago Type 2 diabetes mellitus with microalbuminuria, without long-term current use of insulin [facility])   [facility] Birdie Sons, MD   5 months ago Chronic, continuous use of opioids   [facility] Birdie Sons, MD   7 months ago Cerebellar infarct [facility])   [facility] Birdie Sons, MD   7 months ago Other fatigue   [facility] Birdie Sons, MD       Future Appointments             In 4 weeks Caryn Section, Kirstie Peri, MD [facility], [place]

## 2019-01-26 NOTE — Telephone Encounter (Signed)
Medication Refill - Medication: oxyCODONE-acetaminophen (PERCOCET) 10-325 MG tablet  Has the patient contacted their pharmacy? No - states she only has 6 left (Agent: If no, request that the patient contact the pharmacy for the refill.) (Agent: If yes, when and what did the pharmacy advise?)  Preferred Pharmacy (with phone number or street name):  [facility]),  - [place] Phone:  [phone]  Fax:  [phone]     Agent: Please be advised that RX refills may take up to 3 business days. We ask that you follow-up with your pharmacy.

## 2019-01-26 NOTE — Telephone Encounter (Signed)
From [facility] 

## 2019-01-27 ENCOUNTER — Other Ambulatory Visit: Payer: Self-pay

## 2019-01-27 ENCOUNTER — Ambulatory Visit
Admission: RE | Admit: 2019-01-27 | Discharge: 2019-01-27 | Disposition: A | Discharge status: Home / Self Care | Payer: Medicare HMO | Source: Ambulatory Visit | Attending: Cardiovascular Disease | Admitting: Cardiovascular Disease

## 2019-01-27 DIAGNOSIS — I2510 Atherosclerotic heart disease of native coronary artery without angina pectoris: Secondary | ICD-10-CM | POA: Insufficient documentation

## 2019-01-27 DIAGNOSIS — C3490 Malignant neoplasm of unspecified part of unspecified bronchus or lung: Secondary | ICD-10-CM | POA: Diagnosis not present

## 2019-01-27 DIAGNOSIS — R9389 Abnormal findings on diagnostic imaging of other specified body structures: Secondary | ICD-10-CM | POA: Insufficient documentation

## 2019-01-27 DIAGNOSIS — I700 Atherosclerosis of aorta: Secondary | ICD-10-CM | POA: Diagnosis not present

## 2019-01-27 LAB — GLUCOSE, CAPILLARY: Glucose-Capillary: 132 mg/dL — ABNORMAL HIGH (ref 70–99)

## 2019-01-27 MED ORDER — FLUDEOXYGLUCOSE F - 18 (FDG) INJECTION
9.2300 | Freq: Once | INTRAVENOUS | Status: AC | PRN
  Administered 2019-01-27: 10:00:00 9.23 via INTRAVENOUS

## 2019-01-27 MED ORDER — OXYCODONE-ACETAMINOPHEN 10-325 MG PO TABS: ORAL_TABLET | ORAL | 0 refills | Status: DC

## 2019-01-28 ENCOUNTER — Encounter: Payer: Self-pay | Admitting: Oncology

## 2019-01-28 ENCOUNTER — Inpatient Hospital Stay (HOSPITAL_BASED_OUTPATIENT_CLINIC_OR_DEPARTMENT_OTHER): Payer: Medicare HMO | Admitting: Oncology

## 2019-01-28 ENCOUNTER — Other Ambulatory Visit: Payer: Self-pay

## 2019-01-28 VITALS — BP 101/67 | HR 100 | Temp 97.4°F | Resp 20 | Wt 179.8 lb

## 2019-01-28 DIAGNOSIS — C3491 Malignant neoplasm of unspecified part of right bronchus or lung: Secondary | ICD-10-CM | POA: Diagnosis not present

## 2019-01-28 DIAGNOSIS — J449 Chronic obstructive pulmonary disease, unspecified: Secondary | ICD-10-CM | POA: Diagnosis not present

## 2019-01-28 DIAGNOSIS — Z79899 Other long term (current) drug therapy: Secondary | ICD-10-CM | POA: Diagnosis not present

## 2019-01-28 DIAGNOSIS — Z7984 Long term (current) use of oral hypoglycemic drugs: Secondary | ICD-10-CM | POA: Diagnosis not present

## 2019-01-28 DIAGNOSIS — E119 Type 2 diabetes mellitus without complications: Secondary | ICD-10-CM | POA: Diagnosis not present

## 2019-01-28 DIAGNOSIS — I10 Essential (primary) hypertension: Secondary | ICD-10-CM | POA: Diagnosis not present

## 2019-01-28 DIAGNOSIS — Z8673 Personal history of transient ischemic attack (TIA), and cerebral infarction without residual deficits: Secondary | ICD-10-CM | POA: Diagnosis not present

## 2019-02-08 ENCOUNTER — Other Ambulatory Visit: Payer: Self-pay | Admitting: Family Medicine

## 2019-02-08 DIAGNOSIS — F419 Anxiety disorder, unspecified: Secondary | ICD-10-CM

## 2019-02-08 NOTE — Telephone Encounter (Signed)
Requested medication (s) are due for refill today: yes  Requested medication (s) are on the active medication list: yes  Last refill:  07/02/2018  Future visit scheduled: yes  Notes to clinic:  not delegated    Requested Prescriptions  Pending Prescriptions Disp Refills   clonazePAM (KLONOPIN) 0.5 MG tablet [Pharmacy Med Name: clonazePAM 0.5 MG Oral Tablet] 60 tablet 0    Sig: Take 1 tablet by mouth twice daily as needed      Not Delegated - Psychiatry:  Anxiolytics/Hypnotics Failed - 02/08/2019 11:09 AM      Failed - This refill cannot be delegated      Passed - Urine Drug Screen completed in last 360 days.      Passed - Valid encounter within last 6 months    Recent Outpatient Visits           2 months ago Other fatigue   [facility] Birdie Sons, MD   4 months ago Type 2 diabetes mellitus with microalbuminuria, without long-term current use of insulin [facility])   [facility] Birdie Sons, MD   6 months ago Chronic, continuous use of opioids   [facility] Birdie Sons, MD   7 months ago Cerebellar infarct [facility])   [facility] Birdie Sons, MD   8 months ago Other fatigue   [facility] Birdie Sons, MD       Future Appointments             In 2 weeks Fisher, Kirstie Peri, MD [facility], [place]

## 2019-02-18 ENCOUNTER — Telehealth: Payer: Self-pay | Admitting: Family Medicine

## 2019-02-18 NOTE — Telephone Encounter (Signed)
Patient has follow up office visit next week. Please remind her that she needs to bring all of her pain medications to the visit. This is now a requirement for management of opioid pain medications in [place].

## 2019-02-19 NOTE — Telephone Encounter (Signed)
LVM about appointment and to bring in her medications.

## 2019-02-24 ENCOUNTER — Ambulatory Visit (INDEPENDENT_AMBULATORY_CARE_PROVIDER_SITE_OTHER): Payer: Medicare HMO | Admitting: Family Medicine

## 2019-02-24 ENCOUNTER — Ambulatory Visit: Payer: Medicare Other | Admitting: Family Medicine

## 2019-02-24 VITALS — BP 100/60 | HR 122 | Temp 96.9°F | Wt 176.0 lb

## 2019-02-24 DIAGNOSIS — M5126 Other intervertebral disc displacement, lumbar region: Secondary | ICD-10-CM

## 2019-02-24 DIAGNOSIS — G8929 Other chronic pain: Secondary | ICD-10-CM

## 2019-02-24 DIAGNOSIS — E1129 Type 2 diabetes mellitus with other diabetic kidney complication: Secondary | ICD-10-CM | POA: Diagnosis not present

## 2019-02-24 DIAGNOSIS — M545 Low back pain, unspecified: Secondary | ICD-10-CM

## 2019-02-24 DIAGNOSIS — F1190 Opioid use, unspecified, uncomplicated: Secondary | ICD-10-CM

## 2019-02-24 DIAGNOSIS — R809 Proteinuria, unspecified: Secondary | ICD-10-CM | POA: Diagnosis not present

## 2019-02-24 LAB — POCT GLYCOSYLATED HEMOGLOBIN (HGB A1C): Hemoglobin A1C: 6.3 % — AB (ref 4.0–5.6)

## 2019-02-24 NOTE — Progress Notes (Signed)
Patient: Yvette Riley Female    DOB: Sep 10, 1955   64 y.o.   MRN: [id number] Visit Date: 02/24/2019  Today's Provider: Lelon Huh, MD   Chief Complaint  Patient presents with  . Diabetes  . Medication Management   Subjective:     HPI   The patient is a 64 year old female who presents for follow up of her diabetes.  She states that her glucose readings have been running 120's-130's.  She reports having 1 hypoglycemic event that was resolved after drinking orange juice.   Patient is behind on her metrics.  She has not been to the eye doctor and states that the eye doctors do not take Medicare and therefor she can not go.   Her last A1C was done on 09/22/18 and it was 6.4.  Medication Management The patient was instructed to bring in all of her medications and she went home to go get them but did not bring them back with her.  She is unable to recall her medications other than the ones noted.  She states that the doctors keep taking her off all of her medication.    Allergies  Allergen Reactions  . Codeine Anaphylaxis  . Other Anaphylaxis  . Penicillins Anaphylaxis, Rash and Other (See Comments)    Reaction:  Tongue swelling  Has patient had a PCN reaction causing immediate rash, facial/tongue/throat swelling, SOB or lightheadedness with hypotension:  Yes   Has patient had a PCN reaction causing severe rash involving mucus membranes or skin necrosis: No Has patient had a PCN reaction that required hospitalization No Has patient had a PCN reaction occurring within the last 10 years: No If all of the above answers are "NO", then may proceed with Cephalosporin use.  . Yellow Jacket Venom [Bee Venom] Anaphylaxis  . Crestor [Rosuvastatin] Nausea And Vomiting    Corrected prior adverse reaction per [facility] [employer]. On 12/02/3011  patient reported N & V when she takes Crestor  . Gemfibrozil Rash, Nausea And Vomiting and Swelling  . Trazodone And Nefazodone Nausea And  Vomiting  . Lipitor [Atorvastatin] Nausea Only    By patient report 12/02/2011. Had also been prescribed pravastatin and lovastatin by previous MD. Unclear if those caused same side effects.      Current Outpatient Medications:  .  ezetimibe (ZETIA) 10 MG tablet, Take 1 tablet (10 mg total) by mouth daily., Disp: 30 tablet, Rfl: 12 .  furosemide (LASIX) 20 MG tablet, , Disp: , Rfl:  .  gabapentin (NEURONTIN) 600 MG tablet, Take one tablet up to 4 times a day, Disp: 120 tablet, Rfl: 5 .  metFORMIN (GLUCOPHAGE-XR) 500 MG 24 hr tablet, Take 1 tablet (500 mg total) by mouth 2 (two) times daily with a meal., Disp: 60 tablet, Rfl: 12 .  metoprolol succinate (TOPROL-XL) 25 MG 24 hr tablet, Take 1 tablet (25 mg total) by mouth daily., Disp: 90 tablet, Rfl: 1 .  oxyCODONE-acetaminophen (PERCOCET) 10-325 MG tablet, One tablet every 4-6 hours as needed., Disp: 180 tablet, Rfl: 0 .  sertraline (ZOLOFT) 25 MG tablet, Take 25 mg by mouth daily. , Disp: , Rfl:  .  zaleplon (SONATA) 10 MG capsule, TAKE 1 CAPSULE BY MOUTH IN THE EVENING. MAY TAKE SECOND CAPSULE AFTER 2 HOURS IF NEEDED, Disp: 30 capsule, Rfl: 2 .  albuterol (PROVENTIL) (2.5 MG/3ML) 0.083% nebulizer solution, Take 3 mLs (2.5 mg total) by nebulization every 6 (six) hours as needed for wheezing or  shortness of breath., Disp: 75 mL, Rfl: 3 .  benzonatate (TESSALON PERLES) 100 MG capsule, Take 1 capsule (100 mg total) by mouth 3 (three) times daily as needed for cough., Disp: 60 capsule, Rfl: 3 .  clonazePAM (KLONOPIN) 0.5 MG tablet, Take 1 tablet by mouth twice daily as needed, Disp: 60 tablet, Rfl: 3 .  clopidogrel (PLAVIX) 75 MG tablet, Take 1 tablet (75 mg total) by mouth daily., Disp: 30 tablet, Rfl: 3 .  glucose blood test strip, Contour test strip. Use to check blood sugar once a day for for type 2 diabetes (E11.9), Disp: 100 each, Rfl: 4 .  ipratropium-albuterol (DUONEB) 0.5-2.5 (3) MG/3ML SOLN, 3 ml, Disp: , Rfl:  .  Lancets MISC, Use to check  blood sugar once daily for type 2 diabetes E11.9, Disp: 100 each, Rfl: 4 .  lisinopril (ZESTRIL) 10 MG tablet, , Disp: , Rfl:  .  meloxicam (MOBIC) 15 MG tablet, TAKE 1 TABLET BY MOUTH ONCE DAILY AS NEEDED FOR PAIN, Disp: 90 tablet, Rfl: 3 .  montelukast (SINGULAIR) 10 MG tablet, Take 1 tablet (10 mg total) by mouth at bedtime., Disp: 90 tablet, Rfl: 3 .  NARCAN 4 MG/0.1ML LIQD nasal spray kit, Place 0.4 mg into the nose once. , Disp: , Rfl: 99 .  PROAIR HFA 108 (90 Base) MCG/ACT inhaler, INHALE 2 PUFFS BY MOUTH EVERY 6 HOURS AS NEEDED FOR WHEEZING OR SHORTNESS OF BREATH, Disp: , Rfl: 3 .  ramelteon (ROZEREM) 8 MG tablet, Take 1 tablet (8 mg total) by mouth at bedtime. (Patient not taking: Reported on 01/15/2019), Disp: 30 tablet, Rfl: 3 .  sucralfate (CARAFATE) 1 g tablet, TAKE 1 TABLET BY MOUTH 4 TIMES DAILY WITH MEALS AND AT BEDTIME, Disp: 360 tablet, Rfl: 0 .  umeclidinium-vilanterol (ANORO ELLIPTA) 62.5-25 MCG/INH AEPB, Inhale 1 puff into the lungs daily. (Patient not taking: Reported on 01/15/2019), Disp: 30 each, Rfl: 12  Review of Systems  Constitutional: Positive for fatigue. Negative for chills and fever.  HENT: Negative for congestion, sinus pain and sore throat.   Respiratory: Positive for cough. Negative for chest tightness and shortness of breath.   Cardiovascular: Negative for chest pain and leg swelling.  Gastrointestinal: Negative for abdominal pain.  Endocrine: Positive for polyuria. Negative for polydipsia.    Social History   Tobacco Use  . Smoking status: Former Smoker    Packs/day: 0.25    Years: 45.00    Pack years: 11.25    Types: Cigarettes    Quit date: 03/17/2016    Years since quitting: 2.9  . Smokeless tobacco: Never Used  . Tobacco comment: pt states she quit in 2016-2017  Substance Use Topics  . Alcohol use: No    Alcohol/week: 0.0 standard drinks      Objective:   BP 100/60 (BP Location: Right Arm, Patient Position: Sitting, Cuff Size: Normal)    Pulse (!) 122   Temp (!) 96.9 F (36.1 C) (Skin)   Wt 176 lb (79.8 kg)   SpO2 94%   BMI 37.43 kg/m  Vitals:   02/24/19 0932  BP: 100/60  Pulse: (!) 122  Temp: (!) 96.9 F (36.1 C)  TempSrc: Skin  SpO2: 94%  Weight: 176 lb (79.8 kg)  Body mass index is 37.43 kg/m.   Physical Exam   General: Appearance:    Obese female in no acute distress  Eyes:    PERRL, conjunctiva/corneas clear, EOM's intact       Lungs:  Clear to auscultation bilaterally, respirations unlabored  Heart:    Tachycardic. Normal rhythm. No murmurs, rubs, or gallops.   MS:   All extremities are intact.   Neurologic:   Awake, alert, oriented x 3. No apparent focal neurological           defect.        Results for orders placed or performed in visit on 02/24/19  POCT glycosylated hemoglobin (Hb A1C)  Result Value Ref Range   Hemoglobin A1C 6.3 (A) 4.0 - 5.6 %   HbA1c POC (<> result, manual entry)     HbA1c, POC (prediabetic range)     HbA1c, POC (controlled diabetic range)         Assessment & Plan    1. Type 2 diabetes mellitus with microalbuminuria, without long-term current use of insulin (HCC) Well controlled.  Continue current medications.    2. Chronic, continuous use of opioids   3. Displacement of lumbar intervertebral disc without myelopathy   4. Chronic midline low back pain, unspecified whether sciatica present Stable on current medication regiment. Most recent UDS consistent with prescribed narcotics     Lelon Huh, MD  [facility]

## 2019-02-25 ENCOUNTER — Telehealth: Payer: Self-pay

## 2019-02-25 DIAGNOSIS — G8929 Other chronic pain: Secondary | ICD-10-CM

## 2019-02-25 NOTE — Telephone Encounter (Signed)
Her last one was sent in on 01/27/19   Copied from [place] [id number]. Topic: General - Other >> Feb 25, 2019 10:37 AM Greggory Keen D wrote: Reason for CRM: Pt called saying she went to take the dog out this morning and slipped down her steps in the snow.  She says she is ok but is hurting and wants to know if Dr. Caryn Section was going to call in her oxycodone.  She seen him yesterday in the office.  She first ask about the refill and then preceded to tell me about the fall.  She uses [address].  CB#  [phone]

## 2019-03-01 NOTE — Telephone Encounter (Signed)
Pt called to inquire about her refill for oxyCODONE-acetaminophen (PERCOCET) 10-325 MG tablet / please advise

## 2019-03-02 NOTE — Telephone Encounter (Signed)
Patient called in again checking on status of medication refill. Please advise as pt states she is in pain.

## 2019-03-12 ENCOUNTER — Telehealth: Payer: Self-pay | Admitting: Family Medicine

## 2019-03-12 MED ORDER — OXYCODONE-ACETAMINOPHEN 10-325 MG PO TABS: ORAL_TABLET | ORAL | 0 refills | Status: DC

## 2019-03-12 NOTE — Addendum Note (Signed)
Addended by: Birdie Sons on: 03/12/2019 04:13 PM   Modules accepted: Orders

## 2019-03-12 NOTE — Telephone Encounter (Signed)
Error

## 2019-03-12 NOTE — Telephone Encounter (Signed)
Patient called in again and is stating she is needing to have her script refilled before she goes out of town on Monday. Please advise.

## 2019-03-16 ENCOUNTER — Emergency Department: Payer: Medicare HMO

## 2019-03-16 ENCOUNTER — Other Ambulatory Visit: Payer: Self-pay

## 2019-03-16 ENCOUNTER — Encounter: Payer: Self-pay | Admitting: Emergency Medicine

## 2019-03-16 ENCOUNTER — Emergency Department
Admission: EM | Admit: 2019-03-16 | Discharge: 2019-03-16 | Disposition: A | Discharge status: Home / Self Care | Payer: Medicare HMO | Attending: Emergency Medicine | Admitting: Emergency Medicine

## 2019-03-16 DIAGNOSIS — R05 Cough: Secondary | ICD-10-CM | POA: Diagnosis not present

## 2019-03-16 DIAGNOSIS — I2510 Atherosclerotic heart disease of native coronary artery without angina pectoris: Secondary | ICD-10-CM | POA: Diagnosis not present

## 2019-03-16 DIAGNOSIS — J189 Pneumonia, unspecified organism: Secondary | ICD-10-CM | POA: Diagnosis not present

## 2019-03-16 DIAGNOSIS — E119 Type 2 diabetes mellitus without complications: Secondary | ICD-10-CM | POA: Diagnosis not present

## 2019-03-16 DIAGNOSIS — R1111 Vomiting without nausea: Secondary | ICD-10-CM | POA: Diagnosis not present

## 2019-03-16 DIAGNOSIS — Z20822 Contact with and (suspected) exposure to covid-19: Secondary | ICD-10-CM | POA: Insufficient documentation

## 2019-03-16 DIAGNOSIS — R059 Cough, unspecified: Secondary | ICD-10-CM

## 2019-03-16 DIAGNOSIS — R5381 Other malaise: Secondary | ICD-10-CM | POA: Diagnosis not present

## 2019-03-16 DIAGNOSIS — R112 Nausea with vomiting, unspecified: Secondary | ICD-10-CM | POA: Diagnosis not present

## 2019-03-16 DIAGNOSIS — J449 Chronic obstructive pulmonary disease, unspecified: Secondary | ICD-10-CM | POA: Insufficient documentation

## 2019-03-16 DIAGNOSIS — R110 Nausea: Secondary | ICD-10-CM | POA: Diagnosis not present

## 2019-03-16 DIAGNOSIS — Z87891 Personal history of nicotine dependence: Secondary | ICD-10-CM | POA: Diagnosis not present

## 2019-03-16 DIAGNOSIS — I10 Essential (primary) hypertension: Secondary | ICD-10-CM | POA: Insufficient documentation

## 2019-03-16 DIAGNOSIS — R509 Fever, unspecified: Secondary | ICD-10-CM | POA: Diagnosis not present

## 2019-03-16 LAB — SARS CORONAVIRUS 2 (TAT 6-24 HRS): SARS Coronavirus 2: NEGATIVE

## 2019-03-16 LAB — CBC
HCT: 37 % (ref 36.0–46.0)
Hemoglobin: 12 g/dL (ref 12.0–15.0)
MCH: 30.6 pg (ref 26.0–34.0)
MCHC: 32.4 g/dL (ref 30.0–36.0)
MCV: 94.4 fL (ref 80.0–100.0)
Platelets: 278 10*3/uL (ref 150–400)
RBC: 3.92 MIL/uL (ref 3.87–5.11)
RDW: 14.4 % (ref 11.5–15.5)
WBC: 6.1 10*3/uL (ref 4.0–10.5)
nRBC: 0 % (ref 0.0–0.2)

## 2019-03-16 LAB — BASIC METABOLIC PANEL
Anion gap: 14 (ref 5–15)
BUN: 13 mg/dL (ref 8–23)
CO2: 22 mmol/L (ref 22–32)
Calcium: 9.3 mg/dL (ref 8.9–10.3)
Chloride: 103 mmol/L (ref 98–111)
Creatinine, Ser: 0.62 mg/dL (ref 0.44–1.00)
GFR calc Af Amer: >60 mL/min (ref >60)
GFR calc non Af Amer: >60 mL/min (ref >60)
Glucose, Bld: 159 mg/dL — ABNORMAL HIGH (ref 70–99)
Potassium: 4.2 mmol/L (ref 3.5–5.1)
Sodium: 139 mmol/L (ref 135–145)

## 2019-03-16 MED ORDER — ONDANSETRON 4 MG PO TBDP: 4.0000 mg | ORAL_TABLET | Freq: Three times a day (TID) | ORAL | 0 refills | Status: DC | PRN

## 2019-03-16 MED ORDER — IPRATROPIUM-ALBUTEROL 0.5-2.5 (3) MG/3ML IN SOLN
3.0000 mL | Freq: Once | RESPIRATORY_TRACT | Status: AC
  Administered 2019-03-16: 3 mL via RESPIRATORY_TRACT
  Filled 2019-03-16: qty 3

## 2019-03-16 MED ORDER — PREDNISONE 10 MG (21) PO TBPK: ORAL_TABLET | ORAL | 0 refills | Status: DC

## 2019-03-16 MED ORDER — PREDNISONE 20 MG PO TABS
60.0000 mg | ORAL_TABLET | Freq: Once | ORAL | Status: AC
  Administered 2019-03-16: 60 mg via ORAL
  Filled 2019-03-16: qty 3

## 2019-03-16 MED ORDER — ONDANSETRON 4 MG PO TBDP
4.0000 mg | ORAL_TABLET | Freq: Once | ORAL | Status: AC
  Administered 2019-03-16: 4 mg via ORAL
  Filled 2019-03-16: qty 1

## 2019-03-16 NOTE — ED Triage Notes (Signed)
Pt from home via ems with reports of NV x 2 days. Pt has cough noted. Denies known exposure to anyone sick.

## 2019-03-16 NOTE — ED Provider Notes (Signed)
[facility] Emergency Department Provider Note       Time seen: ----------------------------------------- 2:12 PM on 03/16/2019 -----------------------------------------   I have reviewed the triage vital signs and the nursing notes.  HISTORY   Chief Complaint Cough, Fever, and Sore Throat    HPI Yvette Riley is a 64 y.o. female with a history of allergies, anemia, anxiety, asthma, COPD, diabetes, GERD, hypertension, pneumonia, small cell lung cancer who presents to the ED for coughing since yesterday without being able to stop.  Patient states she vomited all night last night as well.  She has had vomiting for 2 days.  She denies any recent sick exposures.  She denies any fever.  Past Medical History:  Diagnosis Date  . Allergy   . Anemia   . Anxiety   . Arthritis   . Asthma   . Blood transfusion without reported diagnosis   . C. difficile diarrhea 04/07/2016  . CAP (community acquired pneumonia) 03/21/2015  . Combined forms of age-related cataract of both eyes 05/06/2016  . COPD (chronic obstructive pulmonary disease) ([place])   . CVA (cerebral vascular accident) ([place]) 06/17/2018  . Diabetes mellitus without complication ([place])   . Displacement of lumbar intervertebral disc without myelopathy   . Emphysema of lung ([place])   . GERD (gastroesophageal reflux disease)   . Glaucoma   . History of chicken pox   . Hypertension   . Pneumonia 03/29/2015  . Sepsis ([place]) 03/17/2016  . Shortness of breath   . Small cell lung cancer [facility])     Patient Active Problem List   Diagnosis Date Noted  . 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 Chronic, continuous use of opioids 08/05/2018  . Cerebellar infarct ([place]) 06/17/2018  . Chronic cough 06/02/2018  . Anxiety 07/19/2016  . Small cell lung cancer, right ([place]) 05/13/2016  . Bilateral presbyopia 05/06/2016  . Combined forms of age-related cataract of both eyes 05/06/2016  . Dry eye syndrome of both  lacrimal glands 05/06/2016  . Chemoprophylaxis 04/29/2016  . Severe obesity (BMI 35.0-39.9) with comorbidity ([place]) 04/16/2016  . Elevated d-dimer 04/06/2016  . Weakness generalized 04/06/2016  . Mass of upper lobe of right lung 03/01/2016  . Coronary atherosclerosis 02/29/2016  . Allergic rhinitis 09/27/2014  . Asthma 09/27/2014  . Anemia 09/27/2014  . Hyperglyceridemia, pure 09/27/2014  . Glaucoma 09/27/2014  . Hip pain 09/27/2014  . Right knee pain 09/27/2014  . Displacement of lumbar intervertebral disc without myelopathy 09/27/2014  . Thoracic back pain 09/27/2014  . Insomnia 08/11/2014  . COPD (chronic obstructive pulmonary disease) ([place]) 06/12/2014  . Type 2 diabetes mellitus ([place]) 06/12/2014  . Back pain 06/12/2014  . Smoking greater than 30 pack years 03/28/2014    Past Surgical History:  Procedure Laterality Date  . CESAREAN SECTION    . TUBAL LIGATION      Allergies Codeine, Other, Penicillins, Yellow jacket venom [bee venom], Crestor [rosuvastatin], Gemfibrozil, Trazodone and nefazodone, and Lipitor [atorvastatin]  Social History Social History   Tobacco Use  . Smoking status: Former Smoker    Packs/day: 0.25    Years: 45.00    Pack years: 11.25    Types: Cigarettes    Quit date: 03/17/2016    Years since quitting: 2.9  . Smokeless tobacco: Never Used  . Tobacco comment: pt states she quit in 2016-2017  Substance Use Topics  . Alcohol use: No    Alcohol/week: 0.0 standard drinks  . Drug use: No  Review of Systems Constitutional: Negative for fever. Cardiovascular: Negative for chest pain. Respiratory: Positive for cough Gastrointestinal: Negative for abdominal pain, positive for nausea and vomiting Musculoskeletal: Negative for back pain. Skin: Negative for rash. Neurological: Negative for headaches, focal weakness or numbness.  All systems negative/normal/unremarkable except as stated in the  HPI  ____________________________________________   PHYSICAL EXAM:  VITAL SIGNS: ED Triage Vitals  Enc Vitals Group     BP 03/16/19 1036 122/77     Pulse Rate 03/16/19 1036 99     Resp 03/16/19 1036 16     Temp 03/16/19 1036 98.3 F (36.8 C)     Temp Source 03/16/19 1036 Oral     SpO2 03/16/19 1036 95 %     Weight 03/16/19 1037 175 lb 14.8 oz (79.8 kg)     Height 03/16/19 1037 4\' 11"  (1.499 m)     Head Circumference --      Peak Flow --      Pain Score 03/16/19 1036 10     Pain Loc --      Pain Edu? --      Excl. in [place]? --     Constitutional: Alert and oriented. Well appearing and in no distress. Eyes: Conjunctivae are normal. Normal extraocular movements. ENT      Head: Normocephalic and atraumatic.      Nose: No congestion/rhinnorhea.      Mouth/Throat: Mucous membranes are moist.      Neck: No stridor. Cardiovascular: Normal rate, regular rhythm. No murmurs, rubs, or gallops. Respiratory: Normal respiratory effort without tachypnea nor retractions.  Mild wheezing bilaterally Gastrointestinal: Soft and nontender. Normal bowel sounds Musculoskeletal: Nontender with normal range of motion in extremities. No lower extremity tenderness nor edema. Neurologic:  Normal speech and language. No gross focal neurologic deficits are appreciated.  Skin:  Skin is warm, dry and intact. No rash noted. Psychiatric: Mood and affect are normal. Speech and behavior are normal.  ____________________________________________  EKG: Interpreted by me.  Sinus rhythm with rate of 98 bpm, right bundle branch block, normal axis, borderline long QT  ____________________________________________  ED COURSE:  As part of my medical decision making, I reviewed the following data within the [place] History obtained from family if available, nursing notes, old chart and ekg, as well as notes from prior ED visits. Patient presented for cough with nausea and vomiting, we will assess  with labs and imaging as indicated at this time.   Procedures  [name] was evaluated in Emergency Department on 03/16/2019 for the symptoms described in the history of present illness. She was evaluated in the context of the global COVID-19 pandemic, which necessitated consideration that the patient might be at risk for infection with the SARS-CoV-2 virus that causes COVID-19. Institutional protocols and algorithms that pertain to the evaluation of patients at risk for COVID-19 are in a state of rapid change based on information released by regulatory bodies including the CDC and federal and state organizations. These policies and algorithms were followed during the patient's care in the ED.  ____________________________________________   LABS (pertinent positives/negatives)  Labs Reviewed  BASIC METABOLIC PANEL - Abnormal; Notable for the following components:      Result Value   Glucose, Bld 159 (*)    All other components within normal limits  SARS CORONAVIRUS 2 (TAT 6-24 HRS)  CBC    RADIOLOGY Chest x-ray IMPRESSION: Scarring with volume loss, likely age due to post radiation therapy change in the  right perihilar and posterior segment right upper lobe regions. Lungs elsewhere clear. Heart upper normal in size. No adenopathy appreciable.  ____________________________________________   DIFFERENTIAL DIAGNOSIS   Bronchitis, COPD, asthma, URI, COVID-19, pneumonia, dehydration, gastroenteritis  FINAL ASSESSMENT AND PLAN  Cough, vomiting and diarrhea   Plan: The patient had presented for cough as well as vomiting and diarrhea. Patient's labs were reassuring. Patient's imaging not reveal any acute process.  She was given a DuoNeb and steroids here.  She was also given Zofran for the nausea vomiting we sent off for a Covid swab.  Otherwise she is cleared for outpatient follow-up with normal vital signs.   Laurence Aly, MD    Note: This note was generated in part  or whole with voice recognition software. Voice recognition is usually quite accurate but there are transcription errors that can and very often do occur. I apologize for any typographical errors that were not detected and corrected.     Earleen Newport, MD 03/16/19 1414

## 2019-03-16 NOTE — ED Notes (Signed)
See triage note  Presents with cough and some n/v    States she developed cough last pm Vomiting also last pm.  Last time vomited was 10 am  States she also had fever at home but is afebrile on arrival

## 2019-03-16 NOTE — ED Triage Notes (Signed)
Says cough since yesterday and cannot stop.  Says she has vomited all night.  Says some fever.

## 2019-03-30 ENCOUNTER — Other Ambulatory Visit: Payer: Self-pay | Admitting: Family Medicine

## 2019-03-30 MED ORDER — ZALEPLON 10 MG PO CAPS: 10.0000 mg | ORAL_CAPSULE | Freq: Every day | ORAL | 3 refills | Status: DC

## 2019-03-30 NOTE — Telephone Encounter (Signed)
Medication Refill - Medication: zaleplon (SONATA) 10 MG capsule    Has the patient contacted their pharmacy? Yes.   (Agent: If no, request that the patient contact the pharmacy for the refill.) (Agent: If yes, when and what did the pharmacy advise?)  Preferred Pharmacy (with phone number or street name):  [facility] [place] Phone:  [phone]  Fax:  [phone]       Agent: Please be advised that RX refills may take up to 3 business days. We ask that you follow-up with your pharmacy.

## 2019-04-05 ENCOUNTER — Other Ambulatory Visit: Payer: Self-pay | Admitting: Family Medicine

## 2019-04-05 DIAGNOSIS — M5442 Lumbago with sciatica, left side: Secondary | ICD-10-CM

## 2019-04-05 DIAGNOSIS — M5441 Lumbago with sciatica, right side: Secondary | ICD-10-CM

## 2019-04-05 DIAGNOSIS — G8929 Other chronic pain: Secondary | ICD-10-CM

## 2019-04-05 NOTE — Telephone Encounter (Signed)
Pt called and stated she needs a refill for her oxyCODONE-acetaminophen (PERCOCET) 10-325 MG tablet [place] [place]age states she only has 6 tablets left and she would like to speak with Dr. Caryn Section please advise

## 2019-04-07 DIAGNOSIS — H5203 Hypermetropia, bilateral: Secondary | ICD-10-CM | POA: Diagnosis not present

## 2019-04-07 DIAGNOSIS — Z0100 Encounter for examination of eyes and vision without abnormal findings: Secondary | ICD-10-CM | POA: Diagnosis not present

## 2019-04-07 NOTE — Telephone Encounter (Signed)
Dispensed 30 days supply 03-13-2019. Next dispense due 03-15-2019

## 2019-04-08 ENCOUNTER — Telehealth: Payer: Self-pay

## 2019-04-08 ENCOUNTER — Other Ambulatory Visit: Payer: Self-pay | Admitting: Family Medicine

## 2019-04-08 DIAGNOSIS — F419 Anxiety disorder, unspecified: Secondary | ICD-10-CM

## 2019-04-08 DIAGNOSIS — G8929 Other chronic pain: Secondary | ICD-10-CM

## 2019-04-08 DIAGNOSIS — M5441 Lumbago with sciatica, right side: Secondary | ICD-10-CM

## 2019-04-08 NOTE — Telephone Encounter (Signed)
Requested medication (s) are due for refill today: yes  Requested medication (s) are on the active medication list: yes  Last refill:  03/12/19  Future visit scheduled:yes  Notes to clinic:  not delegated    Requested Prescriptions  Pending Prescriptions Disp Refills   oxyCODONE-acetaminophen (PERCOCET) 10-325 MG tablet 180 tablet 0    Sig: One tablet every 4-6 hours as needed.      Not Delegated - Analgesics:  Opioid Agonist Combinations Failed - 04/08/2019  8:19 AM      Failed - This refill cannot be delegated      Passed - Urine Drug Screen completed in last 360 days.      Passed - Valid encounter within last 6 months    Recent Outpatient Visits           1 month ago Type 2 diabetes mellitus with microalbuminuria, without long-term current use of insulin [facility])   [facility] Birdie Sons, MD   4 months ago Other fatigue   [facility] Birdie Sons, MD   6 months ago Type 2 diabetes mellitus with microalbuminuria, without long-term current use of insulin [facility])   [facility] Birdie Sons, MD   8 months ago Chronic, continuous use of opioids   [facility] Birdie Sons, MD   9 months ago Cerebellar infarct [facility])   [facility] Birdie Sons, MD       Future Appointments             In 1 month Fisher, Kirstie Peri, MD [facility], [place]

## 2019-04-08 NOTE — Telephone Encounter (Signed)
Last dispensed 30 day supply 03-13-2019. Next dispense due 04-12-2019

## 2019-04-08 NOTE — Telephone Encounter (Signed)
Copied from [place] [id number]. Topic: General - Other >> Apr 08, 2019  2:08 PM Rainey Pines A wrote: Patient requesting callback from nurse in regards to clarification on pain medication dispense date. Patient stated that she should be able to get her medication on 3/13 instead of 3/15. Please advise

## 2019-04-08 NOTE — Telephone Encounter (Signed)
Medication Refill - Medication: oxycodone  Has the patient contacted their pharmacy? Yes.   (Agent: If no, request that the patient contact the pharmacy for the refill.) (Agent: If yes, when and what did the pharmacy advise?)  Preferred Pharmacy (with phone number or street name):  [facility] [place], [place] [address]  [address] [place] [address]  Phone: [phone] Fax: [phone]  Not a 24 hour pharmacy; exact hours not known.   please be advised that RX refills may take up to 3 business days. We ask that you follow-up with your pharmacy.

## 2019-04-08 NOTE — Telephone Encounter (Signed)
Medication Refill: oxyCODONE-acetaminophen (PERCOCET) 10-325 MG tablet    Medication Request: Pt called requesting to have all of her medications transferred to [facility] temporarily, pt's son was in a motorcycle accident and she is having to care of him there.    [place], [place] [address]  [address] [place] [address]  Phone: [phone] Fax: [phone]

## 2019-04-08 NOTE — Telephone Encounter (Signed)
Patient called back to say that she is in [place] and will be there at least 2 months she wanted all the medication that she is taking currently to be transferred to the [facility] there. Right now she is waiting on a refill of her oxyCODONE-acetaminophen (PERCOCET) 10-325 MG tablet  To be sent to the [facility] Patient asking for a call back at Ph#   [phone]

## 2019-04-08 NOTE — Telephone Encounter (Signed)
Please advise 

## 2019-04-08 NOTE — Telephone Encounter (Signed)
Tried calling patient. Left message to call back. Which medications does she need (I need specific names of medications)? Is she due for refills on these medications now?  OK for PEC to ask patient questions and route back to office with patients response.

## 2019-04-09 MED ORDER — SUCRALFATE 1 G PO TABS: ORAL_TABLET | ORAL | 12 refills | Status: DC

## 2019-04-09 MED ORDER — GABAPENTIN 600 MG PO TABS: ORAL_TABLET | ORAL | 5 refills | Status: DC

## 2019-04-09 NOTE — Telephone Encounter (Signed)
Pt called and is requesting to have this sent to the pharmacy because she will be out of medication tomorrow. Please advise.

## 2019-04-09 NOTE — Telephone Encounter (Signed)
Patient advised that her Oxycodone prescription is not due until 03/15/2019. I explained to patient that she was given a 30 day supply on 03/13/2019, and since February only has 28 days, she should have enough medication to get by until 04/12/2019. Patient told me that she didn't realize that. She admitted to taking more pills because she fell down the steps one day and was in excruciating pain one night.    Patient would like all the following medications sent to [facility] in [place]. She states she plans to be in [place] for 2 months because she is helping to care for her son. Patient has tried getting [facility] in [place] to transfer prescriptions, but they told her she would have to contact her doctor to have new prescriptions sent in. Please re  -Gabapentin -Clonazepam -Metformin -Metoprolol -Sucralfate -Sonata -Oxycodone

## 2019-04-12 MED ORDER — OXYCODONE-ACETAMINOPHEN 10-325 MG PO TABS: ORAL_TABLET | ORAL | 0 refills | Status: DC

## 2019-04-12 NOTE — Telephone Encounter (Signed)
Pt calling back to check status. Pt would like medication called in today. Please advise

## 2019-04-29 MED ORDER — ZALEPLON 10 MG PO CAPS: 10.0000 mg | ORAL_CAPSULE | Freq: Every day | ORAL | 3 refills | Status: DC

## 2019-04-29 MED ORDER — SUCRALFATE 1 G PO TABS: ORAL_TABLET | ORAL | 12 refills | Status: DC

## 2019-04-29 MED ORDER — CLONAZEPAM 0.5 MG PO TABS: 0.5000 mg | ORAL_TABLET | Freq: Two times a day (BID) | ORAL | 3 refills | Status: DC | PRN

## 2019-04-29 MED ORDER — METFORMIN HCL ER 500 MG PO TB24: 500.0000 mg | ORAL_TABLET | Freq: Two times a day (BID) | ORAL | 12 refills | Status: DC

## 2019-04-29 MED ORDER — GABAPENTIN 600 MG PO TABS: ORAL_TABLET | ORAL | 5 refills | Status: DC

## 2019-04-29 MED ORDER — METOPROLOL SUCCINATE ER 25 MG PO TB24: 25.0000 mg | ORAL_TABLET | Freq: Every day | ORAL | 1 refills | Status: DC

## 2019-04-29 NOTE — Addendum Note (Signed)
Addended by: Birdie Sons on: 04/29/2019 05:11 PM   Modules accepted: Orders

## 2019-04-29 NOTE — Telephone Encounter (Signed)
Patient called in checking on status of having medications transferred to [facility]. Patient states she is having some trouble transferring from one [facility] to the other. Please advise.

## 2019-04-29 NOTE — Addendum Note (Signed)
Addended by: Jules Schick on: 04/29/2019 03:14 PM   Modules accepted: Orders

## 2019-05-06 ENCOUNTER — Other Ambulatory Visit: Payer: Self-pay | Admitting: Family Medicine

## 2019-05-06 ENCOUNTER — Telehealth: Payer: Self-pay

## 2019-05-06 DIAGNOSIS — F419 Anxiety disorder, unspecified: Secondary | ICD-10-CM

## 2019-05-06 NOTE — Telephone Encounter (Signed)
Copied from [place] [id number]. Topic: General - Inquiry >> May 06, 2019 12:58 PM Yvette Riley, NT wrote: Reason for CRM: Patient called in stating that [facility] will fax a request for her pain medication. Please advise.

## 2019-05-06 NOTE — Telephone Encounter (Signed)
[place] faxed refill request for the following medications: [employer] [phone] - phone [phone] - fax  metoprolol succinate (TOPROL-XL) 25 MG 24 hr tablet sucralfate (CARAFATE) 1 g tablet   [employer] TRUE METRIX AIR METER gabapentin (NEURONTIN) 600 MG tablet metFORMIN (GLUCOPHAGE-XR) 500 MG 24 hr tablet metoprolol succinate (TOPROL-XL) 25 MG 24 hr tablet  Please advise.  Thanks, [employer]

## 2019-05-07 ENCOUNTER — Telehealth: Payer: Self-pay | Admitting: Family Medicine

## 2019-05-07 NOTE — Telephone Encounter (Signed)
[place] faxed refill request for the following medications:   clonazePAM (KLONOPIN) 0.5 MG tablet   Please advise.  Thanks, [employer]

## 2019-05-07 NOTE — Telephone Encounter (Signed)
Prescriptions switched to mail order

## 2019-05-09 MED ORDER — METOPROLOL SUCCINATE ER 25 MG PO TB24: 25.0000 mg | ORAL_TABLET | Freq: Every day | ORAL | 4 refills | Status: DC

## 2019-05-09 MED ORDER — SUCRALFATE 1 G PO TABS: ORAL_TABLET | ORAL | 1 refills | Status: DC

## 2019-05-09 MED ORDER — CLONAZEPAM 0.5 MG PO TABS: 0.5000 mg | ORAL_TABLET | Freq: Two times a day (BID) | ORAL | 1 refills | Status: DC | PRN

## 2019-05-09 MED ORDER — METFORMIN HCL ER 500 MG PO TB24: 500.0000 mg | ORAL_TABLET | Freq: Two times a day (BID) | ORAL | 4 refills | Status: DC

## 2019-05-09 MED ORDER — GABAPENTIN 600 MG PO TABS: ORAL_TABLET | ORAL | 2 refills | Status: DC

## 2019-05-10 ENCOUNTER — Telehealth: Payer: Self-pay | Admitting: Family Medicine

## 2019-05-10 ENCOUNTER — Other Ambulatory Visit: Payer: Self-pay | Admitting: Family Medicine

## 2019-05-10 DIAGNOSIS — M5442 Lumbago with sciatica, left side: Secondary | ICD-10-CM

## 2019-05-10 DIAGNOSIS — G8929 Other chronic pain: Secondary | ICD-10-CM

## 2019-05-10 NOTE — Telephone Encounter (Signed)
Requested medication (s) are due for refill today: no  Requested medication (s) are on the active medication list: yes  Last refill:  yes  Future visit scheduled: yes  Notes to clinic:  Medication not delegated to NT to refill   Requested Prescriptions  Pending Prescriptions Disp Refills   oxyCODONE-acetaminophen (PERCOCET) 10-325 MG tablet 180 tablet 0    Sig: One tablet every 4-6 hours as needed.      Not Delegated - Analgesics:  Opioid Agonist Combinations Failed - 05/10/2019 11:36 AM      Failed - This refill cannot be delegated      Passed - Urine Drug Screen completed in last 360 days.      Passed - Valid encounter within last 6 months    Recent Outpatient Visits           2 months ago Type 2 diabetes mellitus with microalbuminuria, without long-term current use of insulin [facility])   [facility] Birdie Sons, MD   5 months ago Other fatigue   [facility] Birdie Sons, MD   7 months ago Type 2 diabetes mellitus with microalbuminuria, without long-term current use of insulin [facility])   [facility] Birdie Sons, MD   9 months ago Chronic, continuous use of opioids   [facility] Birdie Sons, MD   10 months ago Cerebellar infarct [facility])   [facility] Birdie Sons, MD       Future Appointments             In 2 weeks Fisher, Kirstie Peri, MD [facility], [facility]

## 2019-05-10 NOTE — Telephone Encounter (Signed)
[place] faxed refill request for the following medications:   [place], [place] True Metrix Air Meter  Please advise.  Thanks, [employer]

## 2019-05-10 NOTE — Telephone Encounter (Signed)
Medication Refill - Medication: oxycodone   Has the patient contacted their pharmacy? Yes.    (Agent: If no, request that the patient contact the pharmacy for the refill.) (Agent: If yes, when and what did the pharmacy advise?)  Preferred Pharmacy (with phone number or street name):  [place], [place] [address]  [address] [place] [address]  Phone: [phone] Fax: [phone]  Not a 24 hour pharmacy; exact hours not known.    Agent: Please be advised that RX refills may take up to 3 business days. We ask that you follow-up with your pharmacy.

## 2019-05-11 MED ORDER — OXYCODONE-ACETAMINOPHEN 10-325 MG PO TABS: ORAL_TABLET | ORAL | 0 refills | Status: DC

## 2019-05-11 MED ORDER — TRUE METRIX AIR GLUCOSE METER DEVI: 1.0000 | Freq: Once | 0 refills | Status: AC

## 2019-05-11 NOTE — Telephone Encounter (Signed)
Pt called to request status update on refill, please advise

## 2019-05-11 NOTE — Telephone Encounter (Signed)
RX sent to [facility].

## 2019-05-11 NOTE — Telephone Encounter (Signed)
Pt called to specify that she needs the round tablets and not the capsules.

## 2019-05-12 ENCOUNTER — Other Ambulatory Visit: Payer: Self-pay | Admitting: Family Medicine

## 2019-05-12 MED ORDER — GLUCOSE BLOOD VI STRP: ORAL_STRIP | 4 refills | Status: DC

## 2019-05-12 MED ORDER — LANCETS MISC: 4 refills | Status: DC

## 2019-05-12 NOTE — Telephone Encounter (Signed)
Medication Refill - Medication: glucose blood test strip  Lancets MISC   Has the patient contacted their pharmacy? Yes.   (Agent: If no, request that the patient contact the pharmacy for the refill.) (Agent: If yes, when and what did the pharmacy advise?)  Preferred Pharmacy (with phone number or street name):  [place], [place] [address]  [address] [place] [address]  Phone: [phone] Fax: [phone]     Agent: Please be advised that RX refills may take up to 3 business days. We ask that you follow-up with your pharmacy.

## 2019-05-12 NOTE — Telephone Encounter (Signed)
Requested Prescriptions  Pending Prescriptions Disp Refills  . glucose blood test strip 100 each 4    Sig: Contour test strip. Use to check blood sugar once a day for for type 2 diabetes (E11.9)     Endocrinology: Diabetes - Testing Supplies Passed - 05/12/2019  3:05 PM      Passed - Valid encounter within last 12 months    Recent Outpatient Visits          2 months ago Type 2 diabetes mellitus with microalbuminuria, without long-term current use of insulin [facility])   [facility] Birdie Sons, MD   5 months ago Other fatigue   [facility] Birdie Sons, MD   7 months ago Type 2 diabetes mellitus with microalbuminuria, without long-term current use of insulin [facility])   [facility] Birdie Sons, MD   9 months ago Chronic, continuous use of opioids   [facility] Birdie Sons, MD   10 months ago Cerebellar infarct [facility])   [facility] Birdie Sons, MD      Future Appointments            In 2 weeks Fisher, Kirstie Peri, MD [facility], [facility]           . Lancets MISC 100 each 4    Sig: Use to check blood sugar once daily for type 2 diabetes E11.9     Endocrinology: Diabetes - Testing Supplies Passed - 05/12/2019  3:05 PM      Passed - Valid encounter within last 12 months    Recent Outpatient Visits          2 months ago Type 2 diabetes mellitus with microalbuminuria, without long-term current use of insulin [facility])   [facility] Birdie Sons, MD   5 months ago Other fatigue   [facility] Birdie Sons, MD   7 months ago Type 2 diabetes mellitus with microalbuminuria, without long-term current use of insulin [facility])   [facility] Birdie Sons, MD   9 months ago Chronic, continuous use of opioids   [facility] Birdie Sons, MD   10 months ago Cerebellar infarct [facility])   [facility] Birdie Sons,  MD      Future Appointments            In 2 weeks Fisher, Kirstie Peri, MD [facility], [place]

## 2019-05-18 ENCOUNTER — Other Ambulatory Visit: Payer: Self-pay | Admitting: Family Medicine

## 2019-05-18 DIAGNOSIS — I69393 Ataxia following cerebral infarction: Secondary | ICD-10-CM | POA: Diagnosis not present

## 2019-05-18 DIAGNOSIS — M542 Cervicalgia: Secondary | ICD-10-CM | POA: Diagnosis not present

## 2019-05-18 DIAGNOSIS — M549 Dorsalgia, unspecified: Secondary | ICD-10-CM | POA: Diagnosis not present

## 2019-05-18 DIAGNOSIS — M25552 Pain in left hip: Secondary | ICD-10-CM | POA: Diagnosis not present

## 2019-05-18 DIAGNOSIS — I7789 Other specified disorders of arteries and arterioles: Secondary | ICD-10-CM | POA: Diagnosis not present

## 2019-05-18 DIAGNOSIS — I69312 Visuospatial deficit and spatial neglect following cerebral infarction: Secondary | ICD-10-CM | POA: Diagnosis not present

## 2019-05-18 DIAGNOSIS — J440 Chronic obstructive pulmonary disease with acute lower respiratory infection: Secondary | ICD-10-CM | POA: Diagnosis not present

## 2019-05-18 DIAGNOSIS — R519 Headache, unspecified: Secondary | ICD-10-CM | POA: Diagnosis not present

## 2019-05-18 DIAGNOSIS — J189 Pneumonia, unspecified organism: Secondary | ICD-10-CM | POA: Diagnosis not present

## 2019-05-18 DIAGNOSIS — E119 Type 2 diabetes mellitus without complications: Secondary | ICD-10-CM | POA: Diagnosis not present

## 2019-05-18 DIAGNOSIS — S79912A Unspecified injury of left hip, initial encounter: Secondary | ICD-10-CM | POA: Diagnosis not present

## 2019-05-18 DIAGNOSIS — F419 Anxiety disorder, unspecified: Secondary | ICD-10-CM | POA: Diagnosis not present

## 2019-05-18 DIAGNOSIS — Z8709 Personal history of other diseases of the respiratory system: Secondary | ICD-10-CM | POA: Insufficient documentation

## 2019-05-18 DIAGNOSIS — R0602 Shortness of breath: Secondary | ICD-10-CM | POA: Diagnosis not present

## 2019-05-18 DIAGNOSIS — R296 Repeated falls: Secondary | ICD-10-CM | POA: Diagnosis not present

## 2019-05-18 DIAGNOSIS — W010XXA Fall on same level from slipping, tripping and stumbling without subsequent striking against object, initial encounter: Secondary | ICD-10-CM | POA: Diagnosis not present

## 2019-05-18 DIAGNOSIS — I6389 Other cerebral infarction: Secondary | ICD-10-CM | POA: Diagnosis not present

## 2019-05-18 DIAGNOSIS — I10 Essential (primary) hypertension: Secondary | ICD-10-CM | POA: Diagnosis not present

## 2019-05-18 DIAGNOSIS — I639 Cerebral infarction, unspecified: Secondary | ICD-10-CM | POA: Diagnosis not present

## 2019-05-18 DIAGNOSIS — J441 Chronic obstructive pulmonary disease with (acute) exacerbation: Secondary | ICD-10-CM | POA: Diagnosis not present

## 2019-05-18 DIAGNOSIS — J3489 Other specified disorders of nose and nasal sinuses: Secondary | ICD-10-CM | POA: Diagnosis not present

## 2019-05-18 MED ORDER — SUCRALFATE 1 G PO TABS: ORAL_TABLET | ORAL | 1 refills | Status: DC

## 2019-05-18 MED ORDER — METOPROLOL SUCCINATE ER 25 MG PO TB24: 25.0000 mg | ORAL_TABLET | Freq: Every day | ORAL | 4 refills | Status: DC

## 2019-05-18 NOTE — Telephone Encounter (Signed)
[place] faxed refill request for the following medications:  metoprolol succinate (TOPROL-XL) 25 MG 24 hr tablet sucralfate (CARAFATE) 1 g tablet  Please advise.  Thanks, [employer]

## 2019-05-19 DIAGNOSIS — I69312 Visuospatial deficit and spatial neglect following cerebral infarction: Secondary | ICD-10-CM | POA: Diagnosis not present

## 2019-05-19 DIAGNOSIS — J441 Chronic obstructive pulmonary disease with (acute) exacerbation: Secondary | ICD-10-CM | POA: Diagnosis not present

## 2019-05-19 DIAGNOSIS — F419 Anxiety disorder, unspecified: Secondary | ICD-10-CM | POA: Diagnosis not present

## 2019-05-19 DIAGNOSIS — R296 Repeated falls: Secondary | ICD-10-CM | POA: Diagnosis not present

## 2019-05-19 DIAGNOSIS — R519 Headache, unspecified: Secondary | ICD-10-CM | POA: Diagnosis not present

## 2019-05-19 DIAGNOSIS — I10 Essential (primary) hypertension: Secondary | ICD-10-CM | POA: Diagnosis not present

## 2019-05-19 DIAGNOSIS — J440 Chronic obstructive pulmonary disease with acute lower respiratory infection: Secondary | ICD-10-CM | POA: Diagnosis not present

## 2019-05-19 DIAGNOSIS — J189 Pneumonia, unspecified organism: Secondary | ICD-10-CM | POA: Diagnosis not present

## 2019-05-19 DIAGNOSIS — I351 Nonrheumatic aortic (valve) insufficiency: Secondary | ICD-10-CM | POA: Diagnosis not present

## 2019-05-19 DIAGNOSIS — R918 Other nonspecific abnormal finding of lung field: Secondary | ICD-10-CM | POA: Diagnosis not present

## 2019-05-19 DIAGNOSIS — M549 Dorsalgia, unspecified: Secondary | ICD-10-CM | POA: Diagnosis not present

## 2019-05-19 DIAGNOSIS — I639 Cerebral infarction, unspecified: Secondary | ICD-10-CM | POA: Diagnosis not present

## 2019-05-19 DIAGNOSIS — I69393 Ataxia following cerebral infarction: Secondary | ICD-10-CM | POA: Diagnosis not present

## 2019-05-19 DIAGNOSIS — E119 Type 2 diabetes mellitus without complications: Secondary | ICD-10-CM | POA: Diagnosis not present

## 2019-05-20 DIAGNOSIS — J189 Pneumonia, unspecified organism: Secondary | ICD-10-CM | POA: Diagnosis not present

## 2019-05-20 DIAGNOSIS — I10 Essential (primary) hypertension: Secondary | ICD-10-CM | POA: Diagnosis not present

## 2019-05-20 DIAGNOSIS — I639 Cerebral infarction, unspecified: Secondary | ICD-10-CM | POA: Diagnosis not present

## 2019-05-20 DIAGNOSIS — E119 Type 2 diabetes mellitus without complications: Secondary | ICD-10-CM | POA: Diagnosis not present

## 2019-05-21 DIAGNOSIS — I10 Essential (primary) hypertension: Secondary | ICD-10-CM | POA: Diagnosis not present

## 2019-05-21 DIAGNOSIS — J189 Pneumonia, unspecified organism: Secondary | ICD-10-CM | POA: Diagnosis not present

## 2019-05-21 DIAGNOSIS — I639 Cerebral infarction, unspecified: Secondary | ICD-10-CM | POA: Diagnosis not present

## 2019-05-21 DIAGNOSIS — E119 Type 2 diabetes mellitus without complications: Secondary | ICD-10-CM | POA: Diagnosis not present

## 2019-05-24 ENCOUNTER — Telehealth: Payer: Self-pay | Admitting: Family Medicine

## 2019-05-24 NOTE — Telephone Encounter (Signed)
Patient has 3 appointments between now and May 7.  Can they all be consolidated?

## 2019-05-24 NOTE — Telephone Encounter (Signed)
Patient is calling to schedule hospital follow up. She has appt on Friday for a 20 min follow up. Patient would like to disscuss d/c medications and hospital follow up. Should she keep Friday appt and Hospital follow up appt? Please advise.

## 2019-05-24 NOTE — Telephone Encounter (Signed)
Can you change her appt on Friday the 30th to 40 minutes at 1:20, then cancel all her other appointments.

## 2019-05-27 NOTE — Progress Notes (Signed)
Called patient x 3 5 minutes apart starting at scheduled appointment time with no answer. No showed appt.

## 2019-05-28 ENCOUNTER — Ambulatory Visit (INDEPENDENT_AMBULATORY_CARE_PROVIDER_SITE_OTHER): Payer: Medicare HMO | Admitting: Family Medicine

## 2019-05-28 DIAGNOSIS — Z5329 Procedure and treatment not carried out because of patient's decision for other reasons: Secondary | ICD-10-CM

## 2019-05-31 ENCOUNTER — Ambulatory Visit: Payer: Self-pay | Admitting: *Deleted

## 2019-05-31 NOTE — Telephone Encounter (Signed)
Summary: please advise   Pt is calling and does not know when to take her medications. Pt would like callback      Patient complains of falling a lot- she was in the hospital at [facility] for 1 week- she wants to discuss medications. Patient has new medication and she does not have her after visit summery from them.Patient had studies on heart. Patient was told to stop taking some of the medication Dr Caryn Section had been prescribing. She wants to discuss this with Dr Caryn Section Friday- advised patinet to contact [facility] for a copy of discharge instruction and medication list- she states she will. Patient has appointment on Friday- she states she will discuss with PCP then- advised patient she should not duplicate medications used to treat same problem if she was told to discontinue.  Patient was riding in a car and she didn't have proper time to discuss this- says she will see PCP on Friday.  Reason for Disposition . [1] Caller requesting NON-URGENT health information AND [2] PCP's office is the best resource  Protocols used: INFORMATION ONLY CALL - NO TRIAGE-A-AH

## 2019-06-02 NOTE — Progress Notes (Signed)
Established patient visit   Patient: Yvette Riley   DOB: [birth year]   64 y.o. Female  MRN: [id number] Visit Date: 06/04/2019  Today's healthcare provider: Lelon Huh, MD   Chief Complaint  Patient presents with  . Hospitalization Follow-up   [employer] as a scribe for Lelon Huh, MD.,have documented all relevant documentation on the behalf of Lelon Huh, MD,as directed by  Lelon Huh, MD while in the presence of Lelon Huh, MD.  Subjective    HPI  Follow up Hospitalization  Patient was admitted to [facility] on 05/18/2019 and discharged on 05/21/2019. She was treated for Ataxia due to and not concurrent with ischemic cerebrovascular accident COPD, & Pneumonia. Treatment for this included Doxycycline 100 mg BID. Telephone follow up was done on N/A She reports good compliance with treatment. She reports this condition is worsened. She also reports having loose watery stools a few times every day for weeks. She describes this as "explosive" with fecal urgency. She has not seen any blood or mucous.  ----------------------------------------------------------------------------------------- -     Medications: Outpatient Medications Prior to Visit  Medication Sig  . albuterol (PROVENTIL) (2.5 MG/3ML) 0.083% nebulizer solution Take 3 mLs (2.5 mg total) by nebulization every 6 (six) hours as needed for wheezing or shortness of breath.  . benzonatate (TESSALON PERLES) 100 MG capsule Take 1 capsule (100 mg total) by mouth 3 (three) times daily as needed for cough.  . clonazePAM (KLONOPIN) 0.5 MG tablet Take 1 tablet (0.5 mg total) by mouth 2 (two) times daily as needed.  . clopidogrel (PLAVIX) 75 MG tablet Take 1 tablet (75 mg total) by mouth daily.  [name] ezetimibe (ZETIA) 10 MG tablet Take 1 tablet (10 mg total) by mouth daily.  . furosemide (LASIX) 20 MG tablet   . gabapentin (NEURONTIN) 600 MG tablet Take one tablet up to 4 times a day  . glucose  blood test strip Contour test strip. Use to check blood sugar once a day for for type 2 diabetes (E11.9)  . ipratropium-albuterol (DUONEB) 0.5-2.5 (3) MG/3ML SOLN 3 ml  . Lancets MISC Use to check blood sugar once daily for type 2 diabetes E11.9  . lisinopril (ZESTRIL) 10 MG tablet   . meloxicam (MOBIC) 15 MG tablet TAKE 1 TABLET BY MOUTH ONCE DAILY AS NEEDED FOR PAIN  . metFORMIN (GLUCOPHAGE-XR) 500 MG 24 hr tablet Take 1 tablet (500 mg total) by mouth 2 (two) times daily with a meal.  . metoprolol succinate (TOPROL-XL) 25 MG 24 hr tablet Take 1 tablet (25 mg total) by mouth daily.  . montelukast (SINGULAIR) 10 MG tablet Take 1 tablet (10 mg total) by mouth at bedtime.  [name] NARCAN 4 MG/0.1ML LIQD nasal spray kit Place 0.4 mg into the nose once.   . ondansetron (ZOFRAN ODT) 4 MG disintegrating tablet Take 1 tablet (4 mg total) by mouth every 8 (eight) hours as needed for nausea or vomiting.  [name] oxyCODONE-acetaminophen (PERCOCET) 10-325 MG tablet One tablet every 4-6 hours as needed.  [name] PROAIR HFA 108 (90 Base) MCG/ACT inhaler INHALE 2 PUFFS BY MOUTH EVERY 6 HOURS AS NEEDED FOR WHEEZING OR SHORTNESS OF BREATH  . sertraline (ZOLOFT) 25 MG tablet Take 25 mg by mouth daily.   . sucralfate (CARAFATE) 1 g tablet TAKE 1 TABLET BY MOUTH 4 TIMES DAILY WITH MEALS AND AT BEDTIME  . zaleplon (SONATA) 10 MG capsule Take 1 capsule (10 mg total) by mouth at bedtime. MAY TAKE SECOND CAPSULE AFTER  2 HOURS IF NEEDED  . [DISCONTINUED] predniSONE (STERAPRED UNI-PAK 21 TAB) 10 MG (21) TBPK tablet Dispense taper pack as directed   No facility-administered medications prior to visit.    Review of Systems  Constitutional: Positive for appetite change.  HENT: Positive for congestion.   Respiratory: Positive for cough.   Cardiovascular: Negative.   Gastrointestinal: Positive for diarrhea. Negative for nausea.  Musculoskeletal: Negative.   Neurological: Positive for dizziness.      Objective    BP (!) 81/56 (BP  Location: Right Arm, Patient Position: Sitting, Cuff Size: Normal)   Pulse (!) 120   Temp (!) 96.9 F (36.1 C) (Temporal)   Wt 168 lb 3.2 oz (76.3 kg)   BMI 33.97 kg/m    Physical Exam Cardiovascular:     Rate and Rhythm: Tachycardia present.  Pulmonary:     Breath sounds: Wheezing present.    General: Appearance:    Obese female in no acute distress  Eyes:    PERRL, conjunctiva/corneas clear, EOM's intact       Lungs:     Clear to auscultation bilaterally, respirations unlabored  Heart:    Tachycardic. Normal rhythm. No murmurs, rubs, or gallops.   MS:   All extremities are intact.   Neurologic:   Awake, alert, oriented x 3. No apparent focal neurological           defect.       No results found for any visits on 06/04/19.  Assessment & Plan    1. Essential hypertension Currently Hypotensive and tachycardic. Reviewed all of her recent hospital records.  Increase intake of Gatorade, water, clear fluids to help rehydrate.Hold Lisinopril at this time   2. Diarrhea, unspecified type Collect stool samples and return to lab.   - C difficile Toxins A+B W/Rflx - Clostridium Difficile by PCR - Ova and parasite examination - Stool culture - Fecal leukocytes - metroNIDAZOLE (FLAGYL) 500 MG tablet; Take 1 tablet (500 mg total) by mouth 2 (two) times daily for 7 days.  Dispense: 14 tablet; Refill: 0, to start after collecting stool sample   Return if symptoms worsen or fail to improve.      The entirety of the information documented in the History of Present Illness, Review of Systems and Physical Exam were personally obtained by me. Portions of this information were initially documented by the CMA and reviewed by me for thoroughness and accuracy.    Addressed extensive list of chronic and acute medical problems today requiring 45 minutes reviewing her medical record, counseling patient regarding her conditions and coordination of care.      Lelon Huh, MD  [facility] [phone] (phone) [phone] (fax)  [place]

## 2019-06-04 ENCOUNTER — Ambulatory Visit (INDEPENDENT_AMBULATORY_CARE_PROVIDER_SITE_OTHER): Payer: Medicare HMO | Admitting: Family Medicine

## 2019-06-04 ENCOUNTER — Encounter: Payer: Self-pay | Admitting: Family Medicine

## 2019-06-04 ENCOUNTER — Other Ambulatory Visit: Payer: Self-pay

## 2019-06-04 VITALS — BP 81/56 | HR 120 | Temp 96.9°F | Wt 168.2 lb

## 2019-06-04 DIAGNOSIS — I10 Essential (primary) hypertension: Secondary | ICD-10-CM

## 2019-06-04 DIAGNOSIS — E860 Dehydration: Secondary | ICD-10-CM

## 2019-06-04 DIAGNOSIS — R197 Diarrhea, unspecified: Secondary | ICD-10-CM | POA: Diagnosis not present

## 2019-06-04 MED ORDER — METRONIDAZOLE 500 MG PO TABS: 500.0000 mg | ORAL_TABLET | Freq: Two times a day (BID) | ORAL | 0 refills | Status: AC

## 2019-06-07 ENCOUNTER — Telehealth: Payer: Self-pay

## 2019-06-07 DIAGNOSIS — R197 Diarrhea, unspecified: Secondary | ICD-10-CM | POA: Diagnosis not present

## 2019-06-07 NOTE — Telephone Encounter (Signed)
OK, disability form is ready to pick up
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
## 2019-06-07 NOTE — Telephone Encounter (Signed)
Form placed up front for pick up. Tried calling patient. No answer, and voice mailbox not set up yet.

## 2019-06-08 ENCOUNTER — Telehealth: Payer: Self-pay | Admitting: Family Medicine

## 2019-06-08 DIAGNOSIS — R197 Diarrhea, unspecified: Secondary | ICD-10-CM | POA: Diagnosis not present

## 2019-06-08 NOTE — Chronic Care Management (AMB) (Signed)
  Chronic Care Management   Outreach Note  06/08/2019 Name: ELITA DAME MRN: [id number] DOB: 04-13-1955  NATESHA HASSEY is a 64 y.o. year old female who is a primary care patient of Caryn Section, Kirstie Peri, MD. I reached out to [name] by phone today in response to a referral sent by Ms. Emmaline Kluver Zipp's health plan.     An unsuccessful telephone outreach was attempted today. The patient was referred to the case management team for assistance with care management and care coordination.   Follow Up Plan: The care management team will reach out to the patient again over the next 7 days.  If patient returns call to provider office, please advise to call [facility]  at [place], [place], [place], [place] [address] Direct Dial: [phone] [email or web address]

## 2019-06-09 ENCOUNTER — Other Ambulatory Visit: Payer: Self-pay | Admitting: Family Medicine

## 2019-06-09 DIAGNOSIS — M5442 Lumbago with sciatica, left side: Secondary | ICD-10-CM

## 2019-06-09 DIAGNOSIS — G8929 Other chronic pain: Secondary | ICD-10-CM

## 2019-06-09 MED ORDER — OXYCODONE-ACETAMINOPHEN 10-325 MG PO TABS: ORAL_TABLET | ORAL | 0 refills | Status: DC

## 2019-06-09 NOTE — Telephone Encounter (Signed)
Medication Refill - Medication: Oxycodone  Has the patient contacted their pharmacy? Yes.   (Agent: If no, request that the patient contact the pharmacy for the refill.) (Agent: If yes, when and what did the pharmacy advise?)  Preferred Pharmacy (with phone number or street name):  [place] ([facility]), [place] - [place] [address]  Phone: [phone] Fax: [phone]  Not a 24 hour pharmacy; exact hours not known.     Agent: Please be advised that RX refills may take up to 3 business days. We ask that you follow-up with your pharmacy.

## 2019-06-10 ENCOUNTER — Telehealth: Payer: Self-pay | Admitting: Family Medicine

## 2019-06-11 LAB — OVA AND PARASITE EXAMINATION

## 2019-06-14 ENCOUNTER — Telehealth: Payer: Self-pay

## 2019-06-14 LAB — STOOL CULTURE: E coli, Shiga toxin Assay: NEGATIVE

## 2019-06-14 LAB — FECAL LEUKOCYTES

## 2019-06-14 NOTE — Telephone Encounter (Signed)
Patient called, unable to leave VM due to mailbox not set up.

## 2019-06-14 NOTE — Telephone Encounter (Signed)
-----   Message from Birdie Sons, MD sent at 06/13/2019 10:03 AM EDT ----- Stool tests are all negative. Please check with patient to see if dizziness and diarrhea have improved since stopping the lisinopril at her last visit, and see if she has been able to check her blood pressure.

## 2019-06-14 NOTE — Telephone Encounter (Signed)
Attempted to contact patient, no answer or voicemail. Okay for PEC to advise patient.

## 2019-06-15 NOTE — Telephone Encounter (Signed)
Left VM on home phone # to return call. Please see Latasha's message from 06/14/19 when patient returns call.

## 2019-06-16 NOTE — Chronic Care Management (AMB) (Signed)
  Chronic Care Management   Outreach Note  06/16/2019 Name: Yvette Riley MRN: [id number] DOB: Nov 12, 1955  Yvette Riley is a 64 y.o. year old female who is a primary care patient of Caryn Section, Kirstie Peri, MD. I reached out to [name] by phone today in response to a referral sent by Ms. [name]'s health plan.     A second unsuccessful telephone outreach was attempted today. The patient was referred to the case management team for assistance with care management and care coordination.   Follow Up Plan: A HIPPA compliant phone message was left for the patient providing contact information and requesting a return call.  The care management team will reach out to the patient again over the next 7 days.  If patient returns call to provider office, please advise to call [facility]  at [place], [place], [place], [place] [address] Direct Dial: [phone] [email or web address]: [email or web address]

## 2019-06-16 NOTE — Telephone Encounter (Signed)
Tried calling patient on cell phone number and was advised by a female that this was no longer her phone number. I tried calling her home number and left a message to call back.

## 2019-06-18 NOTE — Telephone Encounter (Signed)
[employer] called, left VM to return the call to the office.

## 2019-06-18 NOTE — Telephone Encounter (Signed)
Attempted to contact patient, no answer left a voicemail. Okay for P[facility] to advise patient.  

## 2019-06-18 NOTE — Telephone Encounter (Signed)
Patient was seen two weeks ago for dizziness and hypotension. Lisinopril was stopped. Please check with patient and see how BP is doing and if dizziness has improved.

## 2019-06-22 NOTE — Chronic Care Management (AMB) (Signed)
  Chronic Care Management   Outreach Note  06/22/2019 Name: Yvette Riley MRN: [id number] DOB: [birth year]  Yvette Riley is a 64 y.o. year old female who is a primary care patient of Caryn Section, Kirstie Peri, MD. I reached out to Yvette Riley by phone today in response to a referral sent by Yvette Riley's health plan.     Third unsuccessful telephone outreach was attempted today. The patient was referred to the case management team for assistance with care management and care coordination. The patient's primary care provider has been notified of our unsuccessful attempts to make or maintain contact with the patient. The care management team is pleased to engage with this patient at any time in the future should he/she be interested in assistance from the care management team.   Follow Up Plan: A HIPPA compliant phone message was left for the patient providing contact information and requesting a return call.  The care management team is available to follow up with the patient after provider conversation with the patient regarding recommendation for care management engagement and subsequent re-referral to the care management team.   Noreene Larsson, [place], [place], [place] [address] Direct Dial: [phone] [email or web address]: [email or web address]

## 2019-06-24 ENCOUNTER — Telehealth: Payer: Self-pay | Admitting: Family Medicine

## 2019-06-24 NOTE — Telephone Encounter (Signed)
Pt says that the Rx for oxyCODONE-acetaminophen (PERCOCET) 10-325 MG tablet  Should have been sent to the [facility] in [place] instead of [place].  Also,  clonazePAM (KLONOPIN) 0.5 MG tablet     Pharmacy: [place], [place] - [address] Phone:  [phone]  Fax:  [phone]

## 2019-06-25 NOTE — Telephone Encounter (Signed)
Please call [facility] [address] and see when they last dispensed oxycodone.

## 2019-06-25 NOTE — Telephone Encounter (Signed)
Per Ebony Hail at [facility] patient last picked up Oxycodone RX on 06/10/2019 and Clonazepam was last filled in January.

## 2019-06-29 ENCOUNTER — Telehealth: Payer: Self-pay | Admitting: Family Medicine

## 2019-06-29 NOTE — Telephone Encounter (Signed)
Pt is requesting that we assist her with her handicap placard/Paperwork. She wants her portion mailed to her home address if possible, please advise.

## 2019-06-29 NOTE — Telephone Encounter (Signed)
Patient is requesting Dr. Caryn Section to sign an application for a handicap placecard.

## 2019-06-29 NOTE — Telephone Encounter (Signed)
Form placed up front for pick up. Left message on patient's voice mail.

## 2019-07-07 ENCOUNTER — Other Ambulatory Visit: Payer: Self-pay | Admitting: Family Medicine

## 2019-07-07 NOTE — Telephone Encounter (Signed)
Medication Refill - Medication: clopidogrel (PLAVIX) 75 MG tablet   Preferred Pharmacy (with phone number or street name):  [place], [place] [address] Phone:  [phone]  Fax:  [phone]       Agent: Please be advised that RX refills may take up to 3 business days. We ask that you follow-up with your pharmacy.

## 2019-07-07 NOTE — Telephone Encounter (Signed)
Requested medication (s) are due for refill today: yes  Requested medication (s) are on the active medication list: yes  Last refill:  06/20/2018  Future visit scheduled: no  Notes to clinic: medication filled by a different provider Review for  refill   Requested Prescriptions  Pending Prescriptions Disp Refills   clopidogrel (PLAVIX) 75 MG tablet 30 tablet 3    Sig: Take 1 tablet (75 mg total) by mouth daily.      Hematology: Antiplatelets - clopidogrel Failed - 07/07/2019  3:18 PM      Failed - Evaluate AST, ALT within 2 months of therapy initiation.      Passed - ALT in normal range and within 360 days    ALT  Date Value Ref Range Status  12/04/2018 9 0 - 32 IU/L Final          Passed - AST in normal range and within 360 days    AST  Date Value Ref Range Status  12/04/2018 14 0 - 40 IU/L Final          Passed - HCT in normal range and within 180 days    HCT  Date Value Ref Range Status  03/16/2019 37.0 36.0 - 46.0 % Final   Hematocrit  Date Value Ref Range Status  12/04/2018 35.8 34.0 - 46.6 % Final          Passed - HGB in normal range and within 180 days    Hemoglobin  Date Value Ref Range Status  03/16/2019 12.0 12.0 - 15.0 g/dL Final  12/04/2018 12.1 11.1 - 15.9 g/dL Final          Passed - PLT in normal range and within 180 days    Platelets  Date Value Ref Range Status  03/16/2019 278 150 - 400 K/uL Final  12/04/2018 210 150 - 450 x10E3/uL Final          Passed - Valid encounter within last 6 months    Recent Outpatient Visits           1 month ago Essential hypertension   [facility] Birdie Sons, MD   1 month ago No-show for appointment   [facility] Birdie Sons, MD   4 months ago Type 2 diabetes mellitus with microalbuminuria, without long-term current use of insulin [facility])   [facility] Birdie Sons, MD   7 months ago Other fatigue   [facility] Birdie Sons,  MD   9 months ago Type 2 diabetes mellitus with microalbuminuria, without long-term current use of insulin [facility])   [facility] Caryn Section, Kirstie Peri, MD

## 2019-07-12 ENCOUNTER — Other Ambulatory Visit: Payer: Self-pay | Admitting: Family Medicine

## 2019-07-12 DIAGNOSIS — G8929 Other chronic pain: Secondary | ICD-10-CM

## 2019-07-12 DIAGNOSIS — M5442 Lumbago with sciatica, left side: Secondary | ICD-10-CM

## 2019-07-12 MED ORDER — OXYCODONE-ACETAMINOPHEN 10-325 MG PO TABS: ORAL_TABLET | ORAL | 0 refills | Status: DC

## 2019-07-12 NOTE — Telephone Encounter (Signed)
Pt states she spoke with someone at practice on Friday for Oxycodone refill. Last refill 06/09/2019  #180  0 refills. States she ran out and is "Hurting bad."  Would like refill sent to [facility] in [place].   Direct line call to triage. States she is at [place] waiting  . Assured NT would route to practice for PCPs review.

## 2019-07-13 ENCOUNTER — Other Ambulatory Visit: Payer: Self-pay | Admitting: Family Medicine

## 2019-07-13 DIAGNOSIS — G8929 Other chronic pain: Secondary | ICD-10-CM

## 2019-07-13 DIAGNOSIS — M5442 Lumbago with sciatica, left side: Secondary | ICD-10-CM

## 2019-07-13 MED ORDER — OXYCODONE-ACETAMINOPHEN 10-325 MG PO TABS: ORAL_TABLET | ORAL | 0 refills | Status: DC

## 2019-07-13 NOTE — Telephone Encounter (Signed)
Per [facility] patient has not picked up RX. RX was canceled.
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
## 2019-07-13 NOTE — Telephone Encounter (Signed)
Can you call the [facility] in [place] and see if prescription has been picked up. If not then cancel and we can send prescription to [facility] on [address].

## 2019-08-06 ENCOUNTER — Other Ambulatory Visit: Payer: Self-pay | Admitting: Family Medicine

## 2019-08-06 ENCOUNTER — Telehealth: Payer: Self-pay

## 2019-08-06 MED ORDER — METOPROLOL SUCCINATE ER 25 MG PO TB24: 25.0000 mg | ORAL_TABLET | Freq: Every day | ORAL | 2 refills | Status: DC

## 2019-08-06 NOTE — Telephone Encounter (Signed)
Patient called back to request her handicap decal to be renewed. Advised her on the previous CRM that the paperwork has been completed and upfront for her since 06/29/2019. Patient states that she will come to the office after lunch to pick up the document.

## 2019-08-06 NOTE — Telephone Encounter (Signed)
Medication Refill - Medication:  metoprolol succinate (TOPROL-XL) 25 MG 24 hr tablet  Has the patient contacted their pharmacy? Yes.   (Agent: If no, request that the patient contact the pharmacy for the refill.) (Agent: If yes, when and what did the pharmacy advise?)  Preferred Pharmacy (with phone number or street name):  [facility] [place] [address] [place] [address]  Phone: [phone] Fax: [phone]     Agent: Please be advised that RX refills may take up to 3 business days. We ask that you follow-up with your pharmacy.

## 2019-08-06 NOTE — Telephone Encounter (Signed)
Copied from [place] [id number]. Topic: General - Other >> Aug 06, 2019 11:13 AM Erick Blinks wrote: Reason for CRM: Patient is requesting to have her handicap placard reinstated. Please advise [phone]

## 2019-08-08 ENCOUNTER — Other Ambulatory Visit: Payer: Self-pay | Admitting: Family Medicine

## 2019-08-09 ENCOUNTER — Other Ambulatory Visit: Payer: Self-pay | Admitting: Family Medicine

## 2019-08-09 DIAGNOSIS — M5442 Lumbago with sciatica, left side: Secondary | ICD-10-CM

## 2019-08-09 DIAGNOSIS — G8929 Other chronic pain: Secondary | ICD-10-CM

## 2019-08-09 NOTE — Telephone Encounter (Signed)
Requested medication (s) are due for refill today unsure  Requested medication (s) are on the active medication list -yes  Future visit scheduled -yes  Last refill: 07/13/19  Notes to clinic: Request for non delegated Rx  Requested Prescriptions  Pending Prescriptions Disp Refills   oxyCODONE-acetaminophen (PERCOCET) 10-325 MG tablet 180 tablet 0    Sig: One tablet every 4-6 hours as needed.      Not Delegated - Analgesics:  Opioid Agonist Combinations Failed - 08/09/2019 10:14 AM      Failed - This refill cannot be delegated      Failed - Urine Drug Screen completed in last 360 days.      Passed - Valid encounter within last 6 months    Recent Outpatient Visits           2 months ago Essential hypertension   [facility] Birdie Sons, MD   2 months ago No-show for appointment   [facility] Birdie Sons, MD   5 months ago Type 2 diabetes mellitus with microalbuminuria, without long-term current use of insulin [facility])   [facility] Birdie Sons, MD   8 months ago Other fatigue   [facility] Birdie Sons, MD   10 months ago Type 2 diabetes mellitus with microalbuminuria, without long-term current use of insulin [facility])   [facility] Birdie Sons, MD       Future Appointments             In 3 weeks Caryn Section, Kirstie Peri, MD [facility]                Requested Prescriptions  Pending Prescriptions Disp Refills   oxyCODONE-acetaminophen (PERCOCET) 10-325 MG tablet 180 tablet 0    Sig: One tablet every 4-6 hours as needed.      Not Delegated - Analgesics:  Opioid Agonist Combinations Failed - 08/09/2019 10:14 AM      Failed - This refill cannot be delegated      Failed - Urine Drug Screen completed in last 360 days.      Passed - Valid encounter within last 6 months    Recent Outpatient Visits           2 months ago Essential hypertension   [facility] Birdie Sons, MD   2 months ago No-show for appointment   [facility] Birdie Sons, MD   5 months ago Type 2 diabetes mellitus with microalbuminuria, without long-term current use of insulin [facility])   [facility] Birdie Sons, MD   8 months ago Other fatigue   [facility] Birdie Sons, MD   10 months ago Type 2 diabetes mellitus with microalbuminuria, without long-term current use of insulin [facility])   [facility] Birdie Sons, MD       Future Appointments             In 3 weeks Fisher, Kirstie Peri, MD [facility]

## 2019-08-09 NOTE — Telephone Encounter (Signed)
Medication Refill - Medication: oxyCODONE-acetaminophen (PERCOCET) 10-325 MG tablet   Has the patient contacted their pharmacy? No. (Agent: If no, request that the patient contact the pharmacy for the refill.) (Agent: If yes, when and what did the pharmacy advise?)  Preferred Pharmacy (with phone number or street name): [facility] [place] - [place], [address] [place] [address]  Phone:  [phone] Fax:  [phone]   Agent: Please be advised that RX refills may take up to 3 business days. We ask that you follow-up with your pharmacy.

## 2019-08-11 ENCOUNTER — Other Ambulatory Visit: Payer: Self-pay | Admitting: Family Medicine

## 2019-08-11 DIAGNOSIS — M5442 Lumbago with sciatica, left side: Secondary | ICD-10-CM

## 2019-08-11 DIAGNOSIS — G8929 Other chronic pain: Secondary | ICD-10-CM

## 2019-08-11 MED ORDER — OXYCODONE-ACETAMINOPHEN 10-325 MG PO TABS: ORAL_TABLET | ORAL | 0 refills | Status: DC

## 2019-08-11 NOTE — Telephone Encounter (Signed)
Medication Refill - Medication: Hydrocodone  Has the patient contacted their pharmacy? Yes.  Patient states she has to call PCP for pain medication (Agent: If no, request that the patient contact the pharmacy for the refill.) (Agent: If yes, when and what did the pharmacy advise?)  Preferred Pharmacy (with phone number or street name): [facility]  Agent: Please be advised that RX refills may take up to 3 business days. We ask that you follow-up with your pharmacy.

## 2019-08-22 ENCOUNTER — Other Ambulatory Visit: Payer: Self-pay | Admitting: Family Medicine

## 2019-08-22 DIAGNOSIS — F419 Anxiety disorder, unspecified: Secondary | ICD-10-CM

## 2019-08-22 NOTE — Telephone Encounter (Signed)
Requested medication (s) are due for refill today: no  Requested medication (s) are on the active medication list: yes  Last refill:  05/09/19  Future visit scheduled: yes  Notes to clinic:  med not delegated to NT to RF   Requested Prescriptions  Pending Prescriptions Disp Refills   clonazePAM (KLONOPIN) 0.5 MG tablet [Pharmacy Med Name: clonazePAM 0.5 MG Oral Tablet] 60 tablet 0    Sig: Take 1 tablet by mouth twice daily as needed      Not Delegated - Psychiatry:  Anxiolytics/Hypnotics Failed - 08/22/2019 12:31 PM      Failed - This refill cannot be delegated      Failed - Urine Drug Screen completed in last 360 days.      Passed - Valid encounter within last 6 months    Recent Outpatient Visits           2 months ago Essential hypertension   [facility] Birdie Sons, MD   2 months ago No-show for appointment   [facility] Birdie Sons, MD   5 months ago Type 2 diabetes mellitus with microalbuminuria, without long-term current use of insulin [facility])   [facility] Birdie Sons, MD   8 months ago Other fatigue   [facility] Birdie Sons, MD   11 months ago Type 2 diabetes mellitus with microalbuminuria, without long-term current use of insulin [facility])   [facility] Birdie Sons, MD       Future Appointments             In 1 week Fisher, Kirstie Peri, MD [facility]

## 2019-08-24 NOTE — Telephone Encounter (Signed)
Pt called wanting to know if her Klonipin has been sent to the pharmacy. She states she does not use it that much but needs it for anxiety sometimes.  [place]  CB#  [phone]

## 2019-08-25 ENCOUNTER — Other Ambulatory Visit: Payer: Self-pay | Admitting: Family Medicine

## 2019-08-25 DIAGNOSIS — E119 Type 2 diabetes mellitus without complications: Secondary | ICD-10-CM

## 2019-08-25 DIAGNOSIS — E781 Pure hyperglyceridemia: Secondary | ICD-10-CM

## 2019-08-31 NOTE — Progress Notes (Deleted)
Established patient visit   Patient: Yvette Riley   DOB: 1955-08-10   64 y.o. Female  MRN: [id number] Visit Date: 09/01/2019  Today's healthcare provider: Lelon Huh, MD   No chief complaint on file.  Subjective    HPI  Diabetes Mellitus Type II, Follow-up  Lab Results  Component Value Date   HGBA1C 6.3 (A) 02/24/2019   HGBA1C 6.4 (A) 09/22/2018   HGBA1C 6.3 (H) 06/17/2018   Wt Readings from Last 3 Encounters:  06/04/19 168 lb 3.2 oz (76.3 kg)  03/16/19 175 lb 14.8 oz (79.8 kg)  02/24/19 176 lb (79.8 kg)   Last seen for diabetes 6 months ago.  Management since then includes continue same medications. She reports {excellent/good/fair/poor:19665} compliance with treatment. She {is/is not:21021397} having side effects. {document side effects if present:1} Symptoms: {Yes/No:20286} fatigue {Yes/No:20286} foot ulcerations  {Yes/No:20286} appetite changes {Yes/No:20286} nausea  {Yes/No:20286} paresthesia of the feet  {Yes/No:20286} polydipsia  {Yes/No:20286} polyuria {Yes/No:20286} visual disturbances   {Yes/No:20286} vomiting     Home blood sugar records: {diabetes glucometry results:16657}  Episodes of hypoglycemia? {Yes/No:20286} {enter symptoms and frequency of symptoms if yes:1}   Current insulin regiment: none Most Recent Eye Exam: not UTD {Current exercise:16438:::1} {Current diet habits:16563:::1}  Pertinent Labs: Lab Results  Component Value Date   CHOL 185 06/17/2018   HDL 44 06/17/2018   LDLCALC 75 06/17/2018   LDLDIRECT 110 (H) 06/02/2018   TRIG 330 (H) 06/17/2018   CHOLHDL 4.2 06/17/2018   Lab Results  Component Value Date   NA 139 03/16/2019   K 4.2 03/16/2019   CREATININE 0.62 03/16/2019   GFRNONAA >60 03/16/2019   GFRAA >60 03/16/2019   GLUCOSE 159 (H) 03/16/2019     ---------------------------------------------------------------------------------------------------  {Show patient history (optional):23778::" "}    Medications: Outpatient Medications Prior to Visit  Medication Sig  . albuterol (PROVENTIL) (2.5 MG/3ML) 0.083% nebulizer solution Take 3 mLs (2.5 mg total) by nebulization every 6 (six) hours as needed for wheezing or shortness of breath.  . benzonatate (TESSALON PERLES) 100 MG capsule Take 1 capsule (100 mg total) by mouth 3 (three) times daily as needed for cough.  . clonazePAM (KLONOPIN) 0.5 MG tablet Take 1 tablet by mouth twice daily as needed  . clopidogrel (PLAVIX) 75 MG tablet Take 1 tablet (75 mg total) by mouth daily.  [name] ezetimibe (ZETIA) 10 MG tablet Take 1 tablet by mouth once daily  . furosemide (LASIX) 20 MG tablet   . gabapentin (NEURONTIN) 600 MG tablet Take one tablet up to 4 times a day  . glucose blood test strip Contour test strip. Use to check blood sugar once a day for for type 2 diabetes (E11.9)  . ipratropium-albuterol (DUONEB) 0.5-2.5 (3) MG/3ML SOLN 3 ml  . Lancets MISC Use to check blood sugar once daily for type 2 diabetes E11.9  . lisinopril (ZESTRIL) 10 MG tablet   . meloxicam (MOBIC) 15 MG tablet TAKE 1 TABLET BY MOUTH ONCE DAILY AS NEEDED FOR PAIN  . metFORMIN (GLUCOPHAGE-XR) 500 MG 24 hr tablet Take 1 tablet (500 mg total) by mouth 2 (two) times daily with a meal.  . metoprolol succinate (TOPROL-XL) 25 MG 24 hr tablet Take 1 tablet (25 mg total) by mouth daily.  . montelukast (SINGULAIR) 10 MG tablet Take 1 tablet (10 mg total) by mouth at bedtime.  [name] NARCAN 4 MG/0.1ML LIQD nasal spray kit Place 0.4 mg into the nose once.   . ondansetron (ZOFRAN ODT) 4 MG disintegrating  tablet Take 1 tablet (4 mg total) by mouth every 8 (eight) hours as needed for nausea or vomiting.  [name] oxyCODONE-acetaminophen (PERCOCET) 10-325 MG tablet One tablet every 4-6 hours as needed.  [name] PROAIR HFA 108 (90 Base) MCG/ACT inhaler INHALE 2 PUFFS BY MOUTH EVERY 6 HOURS AS NEEDED FOR WHEEZING OR SHORTNESS OF BREATH  . sertraline (ZOLOFT) 25 MG tablet Take 25 mg by mouth daily.   .  sucralfate (CARAFATE) 1 g tablet TAKE 1 TABLET BY MOUTH 4 TIMES DAILY WITH MEALS AND AT BEDTIME  . zaleplon (SONATA) 10 MG capsule Take 1 capsule (10 mg total) by mouth at bedtime. MAY TAKE SECOND CAPSULE AFTER 2 HOURS IF NEEDED   No facility-administered medications prior to visit.    Review of Systems  {Heme  Chem  Endocrine  Serology  Results Review (optional):23779::" "}  Objective    There were no vitals taken for this visit. {Show previous vital signs (optional):23777::" "}  Physical Exam  ***  No results found for any visits on 09/01/19.  Assessment & Plan     ***  No follow-ups on file.      {provider attestation***:1}   Lelon Huh, MD  [facility] [phone] (phone) [phone] (fax)  [place]

## 2019-09-01 ENCOUNTER — Ambulatory Visit: Payer: Medicare HMO | Admitting: Family Medicine
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
## 2019-09-07 ENCOUNTER — Ambulatory Visit (INDEPENDENT_AMBULATORY_CARE_PROVIDER_SITE_OTHER): Payer: Medicare HMO | Admitting: Family Medicine

## 2019-09-07 ENCOUNTER — Encounter: Payer: Self-pay | Admitting: Family Medicine

## 2019-09-07 ENCOUNTER — Other Ambulatory Visit: Payer: Self-pay

## 2019-09-07 VITALS — BP 135/83 | HR 89 | Temp 98.3°F | Resp 18 | Wt 176.0 lb

## 2019-09-07 DIAGNOSIS — R809 Proteinuria, unspecified: Secondary | ICD-10-CM

## 2019-09-07 DIAGNOSIS — E781 Pure hyperglyceridemia: Secondary | ICD-10-CM | POA: Diagnosis not present

## 2019-09-07 DIAGNOSIS — M5442 Lumbago with sciatica, left side: Secondary | ICD-10-CM | POA: Diagnosis not present

## 2019-09-07 DIAGNOSIS — F439 Reaction to severe stress, unspecified: Secondary | ICD-10-CM

## 2019-09-07 DIAGNOSIS — M5126 Other intervertebral disc displacement, lumbar region: Secondary | ICD-10-CM | POA: Diagnosis not present

## 2019-09-07 DIAGNOSIS — R29898 Other symptoms and signs involving the musculoskeletal system: Secondary | ICD-10-CM

## 2019-09-07 DIAGNOSIS — G8929 Other chronic pain: Secondary | ICD-10-CM

## 2019-09-07 DIAGNOSIS — R531 Weakness: Secondary | ICD-10-CM

## 2019-09-07 DIAGNOSIS — E1129 Type 2 diabetes mellitus with other diabetic kidney complication: Secondary | ICD-10-CM | POA: Diagnosis not present

## 2019-09-07 DIAGNOSIS — J449 Chronic obstructive pulmonary disease, unspecified: Secondary | ICD-10-CM

## 2019-09-07 DIAGNOSIS — M5441 Lumbago with sciatica, right side: Secondary | ICD-10-CM

## 2019-09-07 MED ORDER — SERTRALINE HCL 50 MG PO TABS: 50.0000 mg | ORAL_TABLET | Freq: Every day | ORAL | 11 refills | Status: DC

## 2019-09-07 MED ORDER — GABAPENTIN 600 MG PO TABS: ORAL_TABLET | ORAL | 2 refills | Status: DC

## 2019-09-07 MED ORDER — PROAIR HFA 108 (90 BASE) MCG/ACT IN AERS: INHALATION_SPRAY | RESPIRATORY_TRACT | 3 refills | Status: DC

## 2019-09-07 MED ORDER — OXYCODONE-ACETAMINOPHEN 10-325 MG PO TABS: ORAL_TABLET | ORAL | 0 refills | Status: DC

## 2019-09-07 MED ORDER — TIOTROPIUM BROMIDE-OLODATEROL 2.5-2.5 MCG/ACT IN AERS: 2.0000 | INHALATION_SPRAY | Freq: Every day | RESPIRATORY_TRACT | 3 refills | Status: DC

## 2019-09-08 ENCOUNTER — Other Ambulatory Visit: Payer: Self-pay | Admitting: Family Medicine

## 2019-09-08 DIAGNOSIS — J4530 Mild persistent asthma, uncomplicated: Secondary | ICD-10-CM

## 2019-09-08 LAB — CBC
Hematocrit: 37.7 % (ref 34.0–46.6)
Hemoglobin: 12.2 g/dL (ref 11.1–15.9)
MCH: 30.8 pg (ref 26.6–33.0)
MCHC: 32.4 g/dL (ref 31.5–35.7)
MCV: 95 fL (ref 79–97)
Platelets: 293 10*3/uL (ref 150–450)
RBC: 3.96 x10E6/uL (ref 3.77–5.28)
RDW: 14 % (ref 11.7–15.4)
WBC: 6.9 10*3/uL (ref 3.4–10.8)

## 2019-09-08 LAB — HEMOGLOBIN A1C
Est. average glucose Bld gHb Est-mCnc: 137 mg/dL
Hgb A1c MFr Bld: 6.4 % — ABNORMAL HIGH (ref 4.8–5.6)

## 2019-09-08 LAB — COMPREHENSIVE METABOLIC PANEL
ALT: 13 IU/L (ref 0–32)
AST: 20 IU/L (ref 0–40)
Albumin/Globulin Ratio: 1.7 (ref 1.2–2.2)
Albumin: 4.7 g/dL (ref 3.8–4.8)
Alkaline Phosphatase: 90 IU/L (ref 48–121)
BUN/Creatinine Ratio: 16 (ref 12–28)
BUN: 12 mg/dL (ref 8–27)
Bilirubin Total: 0.4 mg/dL (ref 0.0–1.2)
CO2: 21 mmol/L (ref 20–29)
Calcium: 9.2 mg/dL (ref 8.7–10.3)
Chloride: 100 mmol/L (ref 96–106)
Creatinine, Ser: 0.73 mg/dL (ref 0.57–1.00)
GFR calc Af Amer: 102 mL/min/{1.73_m2} (ref >59)
GFR calc non Af Amer: 89 mL/min/{1.73_m2} (ref >59)
Globulin, Total: 2.7 g/dL (ref 1.5–4.5)
Glucose: 126 mg/dL — ABNORMAL HIGH (ref 65–99)
Potassium: 4.3 mmol/L (ref 3.5–5.2)
Sodium: 141 mmol/L (ref 134–144)
Total Protein: 7.4 g/dL (ref 6.0–8.5)

## 2019-09-08 LAB — CK: Total CK: 81 U/L (ref 32–182)

## 2019-09-08 LAB — T4, FREE: Free T4: 0.93 ng/dL (ref 0.82–1.77)

## 2019-09-08 LAB — TSH: TSH: 2.54 u[IU]/mL (ref 0.450–4.500)

## 2019-09-08 LAB — MAGNESIUM: Magnesium: 1.7 mg/dL (ref 1.6–2.3)

## 2019-09-08 MED ORDER — ALBUTEROL SULFATE HFA 108 (90 BASE) MCG/ACT IN AERS: 2.0000 | INHALATION_SPRAY | Freq: Four times a day (QID) | RESPIRATORY_TRACT | 3 refills | Status: DC | PRN

## 2019-09-08 NOTE — Progress Notes (Signed)
b

## 2019-10-01 ENCOUNTER — Other Ambulatory Visit: Payer: Self-pay | Admitting: Family Medicine

## 2019-10-01 DIAGNOSIS — F419 Anxiety disorder, unspecified: Secondary | ICD-10-CM

## 2019-10-01 NOTE — Telephone Encounter (Signed)
Requested medication (s) are due for refill today -yes  Requested medication (s) are on the active medication list -yes  Future visit scheduled -no  Last refill: 08/25/19  Notes to clinic: Request RF of non delegated Rx  Requested Prescriptions  Pending Prescriptions Disp Refills   clonazePAM (KLONOPIN) 0.5 MG tablet [Pharmacy Med Name: clonazePAM 0.5 MG Oral Tablet] 60 tablet 0    Sig: Take 1 tablet by mouth twice daily as needed      Not Delegated - Psychiatry:  Anxiolytics/Hypnotics Failed - 10/01/2019  2:19 PM      Failed - This refill cannot be delegated      Failed - Urine Drug Screen completed in last 360 days.      Passed - Valid encounter within last 6 months    Recent Outpatient Visits           3 weeks ago [place], Donald E, MD   3 months ago Essential hypertension   [facility] Birdie Sons, MD   4 months ago No-show for appointment   [facility] Birdie Sons, MD   7 months ago Type 2 diabetes mellitus with microalbuminuria, without long-term current use of insulin [facility])   [facility] Birdie Sons, MD   10 months ago Other fatigue   [facility] Birdie Sons, MD                  Requested Prescriptions  Pending Prescriptions Disp Refills   clonazePAM (KLONOPIN) 0.5 MG tablet [Pharmacy Med Name: clonazePAM 0.5 MG Oral Tablet] 60 tablet 0    Sig: Take 1 tablet by mouth twice daily as needed      Not Delegated - Psychiatry:  Anxiolytics/Hypnotics Failed - 10/01/2019  2:19 PM      Failed - This refill cannot be delegated      Failed - Urine Drug Screen completed in last 360 days.      Passed - Valid encounter within last 6 months    Recent Outpatient Visits           3 weeks ago [place], Donald E, MD   3 months ago Essential hypertension   [facility] Birdie Sons,  MD   4 months ago No-show for appointment   [facility] Birdie Sons, MD   7 months ago Type 2 diabetes mellitus with microalbuminuria, without long-term current use of insulin [facility])   [facility] Birdie Sons, MD   10 months ago Other fatigue   [facility] Birdie Sons, MD

## 2019-10-01 NOTE — Telephone Encounter (Signed)
Pt is completely out and would like refill before holiday weekend she says, please advise

## 2019-10-01 NOTE — Telephone Encounter (Signed)
Please advise 

## 2019-10-06 ENCOUNTER — Other Ambulatory Visit: Payer: Self-pay | Admitting: Family Medicine

## 2019-10-06 DIAGNOSIS — M5442 Lumbago with sciatica, left side: Secondary | ICD-10-CM

## 2019-10-06 DIAGNOSIS — G8929 Other chronic pain: Secondary | ICD-10-CM

## 2019-10-06 NOTE — Telephone Encounter (Signed)
RX REFILL oxyCODONE-acetaminophen (PERCOCET) 10-325 MG tablet [[id number]]  [place]), [place] - [place] Phone:  [phone]  Fax:  [phone]

## 2019-10-06 NOTE — Telephone Encounter (Signed)
Next refill not due until 10-07-2019

## 2019-10-06 NOTE — Telephone Encounter (Signed)
Requested medication (s) are due for refill today:  Yes  Requested medication (s) are on the active medication list:  Yes  Future visit scheduled:  No  Last Refill: 09/07/19; #180; no refills  Notes to clinic: medication is not delegated.  Requested Prescriptions  Pending Prescriptions Disp Refills   oxyCODONE-acetaminophen (PERCOCET) 10-325 MG tablet 180 tablet 0    Sig: One tablet every 4-6 hours as needed.      Not Delegated - Analgesics:  Opioid Agonist Combinations Failed - 10/06/2019 10:14 AM      Failed - This refill cannot be delegated      Failed - Urine Drug Screen completed in last 360 days.      Passed - Valid encounter within last 6 months    Recent Outpatient Visits           4 weeks ago [place], Donald E, MD   4 months ago Essential hypertension   [facility] Birdie Sons, MD   4 months ago No-show for appointment   [facility] Birdie Sons, MD   7 months ago Type 2 diabetes mellitus with microalbuminuria, without long-term current use of insulin [facility])   [facility] Birdie Sons, MD   10 months ago Other fatigue   [facility] Birdie Sons, MD

## 2019-10-07 MED ORDER — OXYCODONE-ACETAMINOPHEN 10-325 MG PO TABS: ORAL_TABLET | ORAL | 0 refills | Status: DC

## 2019-11-04 ENCOUNTER — Other Ambulatory Visit: Payer: Self-pay | Admitting: Family Medicine

## 2019-11-04 DIAGNOSIS — M5441 Lumbago with sciatica, right side: Secondary | ICD-10-CM

## 2019-11-04 DIAGNOSIS — G8929 Other chronic pain: Secondary | ICD-10-CM

## 2019-11-04 NOTE — Telephone Encounter (Signed)
Requested medication (s) are due for refill today: yes  Requested medication (s) are on the active medication list: yes  Last refill:  10/06/2019  Future visit scheduled: yes  Notes to clinic: This refill cannot be delegated   Requested Prescriptions  Pending Prescriptions Disp Refills   zaleplon (SONATA) 10 MG capsule 30 capsule 3    Sig: Take 1 capsule (10 mg total) by mouth at bedtime. MAY TAKE SECOND CAPSULE AFTER 2 HOURS IF NEEDED      Not Delegated - Psychiatry:  Anxiolytics/Hypnotics Failed - 11/04/2019  4:31 PM      Failed - This refill cannot be delegated      Failed - Urine Drug Screen completed in last 360 days.      Passed - Valid encounter within last 6 months    Recent Outpatient Visits           1 month ago [place], Donald E, MD   5 months ago Essential hypertension   [facility] Birdie Sons, MD   5 months ago No-show for appointment   [facility] Birdie Sons, MD   8 months ago Type 2 diabetes mellitus with microalbuminuria, without long-term current use of insulin [facility])   [facility] Birdie Sons, MD   11 months ago Other fatigue   [facility] Birdie Sons, MD       Future Appointments             In 2 weeks [place], Kirstie Peri, MD [facility]              oxyCODONE-acetaminophen (PERCOCET) 10-325 MG tablet 180 tablet 0    Sig: One tablet every 4-6 hours as needed.      Not Delegated - Analgesics:  Opioid Agonist Combinations Failed - 11/04/2019  4:31 PM      Failed - This refill cannot be delegated      Failed - Urine Drug Screen completed in last 360 days.      Passed - Valid encounter within last 6 months    Recent Outpatient Visits           1 month ago [place], Donald E, MD   5 months ago Essential hypertension   [facility] Birdie Sons, MD   5 months ago No-show for appointment   [facility] Birdie Sons, MD   8 months ago Type 2 diabetes mellitus with microalbuminuria, without long-term current use of insulin [facility])   [facility] Birdie Sons, MD   11 months ago Other fatigue   [facility] Birdie Sons, MD       Future Appointments             In 2 weeks Fisher, Kirstie Peri, MD [facility] [place]

## 2019-11-04 NOTE — Telephone Encounter (Signed)
Medication Refill - Medication: oxyCODONE-acetaminophen (PERCOCET) 10-325 MG tablet zaleplon (SONATA) 10 MG capsule   Has the patient contacted their pharmacy? Yes.   (Agent: If no, request that the patient contact the pharmacy for the refill.) (Agent: If yes, when and what did the pharmacy advise?)  Preferred Pharmacy (with phone number or street name):  [place] [address] [place] - [place] [address] [place] [address]  Phone: [phone] Fax: [phone]     Agent: Please be advised that RX refills may take up to 3 business days. We ask that you follow-up with your pharmacy.

## 2019-11-05 MED ORDER — OXYCODONE-ACETAMINOPHEN 10-325 MG PO TABS: ORAL_TABLET | ORAL | 0 refills | Status: DC

## 2019-11-05 MED ORDER — ZALEPLON 10 MG PO CAPS: 10.0000 mg | ORAL_CAPSULE | Freq: Every day | ORAL | 3 refills | Status: DC

## 2019-11-10 NOTE — Telephone Encounter (Signed)
error 

## 2019-11-19 ENCOUNTER — Other Ambulatory Visit: Payer: Self-pay | Admitting: Family Medicine

## 2019-11-23 ENCOUNTER — Telehealth: Payer: Self-pay

## 2019-11-23 DIAGNOSIS — J449 Chronic obstructive pulmonary disease, unspecified: Secondary | ICD-10-CM | POA: Diagnosis not present

## 2019-11-23 DIAGNOSIS — I10 Essential (primary) hypertension: Secondary | ICD-10-CM | POA: Diagnosis not present

## 2019-11-23 DIAGNOSIS — R112 Nausea with vomiting, unspecified: Secondary | ICD-10-CM | POA: Diagnosis not present

## 2019-11-23 DIAGNOSIS — Z20822 Contact with and (suspected) exposure to covid-19: Secondary | ICD-10-CM | POA: Diagnosis not present

## 2019-11-23 DIAGNOSIS — R638 Other symptoms and signs concerning food and fluid intake: Secondary | ICD-10-CM | POA: Diagnosis not present

## 2019-11-23 DIAGNOSIS — E119 Type 2 diabetes mellitus without complications: Secondary | ICD-10-CM | POA: Diagnosis not present

## 2019-11-23 DIAGNOSIS — R197 Diarrhea, unspecified: Secondary | ICD-10-CM | POA: Diagnosis not present

## 2019-11-23 DIAGNOSIS — G8929 Other chronic pain: Secondary | ICD-10-CM | POA: Diagnosis not present

## 2019-11-23 NOTE — Telephone Encounter (Signed)
Copied from [place] [id number]. Topic: General - Inquiry >> Nov 23, 2019  1:38 PM Scherrie Gerlach wrote: Reason for CRM: pt wants Dr Caryn Section to know she is in the [facility] hospital in [place].. Pt states she is unable to drink, eat, because she throws it right up.  Pt has severe diarrhea.  Pt hasn't been admitted as yet, but hopes she will because she is alone at home.

## 2019-11-24 ENCOUNTER — Ambulatory Visit: Payer: Medicare HMO | Admitting: Family Medicine

## 2019-11-24 ENCOUNTER — Telehealth: Payer: Self-pay

## 2019-11-24 DIAGNOSIS — R112 Nausea with vomiting, unspecified: Secondary | ICD-10-CM | POA: Diagnosis not present

## 2019-11-24 DIAGNOSIS — I10 Essential (primary) hypertension: Secondary | ICD-10-CM | POA: Diagnosis not present

## 2019-11-24 DIAGNOSIS — E119 Type 2 diabetes mellitus without complications: Secondary | ICD-10-CM | POA: Diagnosis not present

## 2019-11-24 DIAGNOSIS — R918 Other nonspecific abnormal finding of lung field: Secondary | ICD-10-CM | POA: Diagnosis not present

## 2019-11-24 DIAGNOSIS — R197 Diarrhea, unspecified: Secondary | ICD-10-CM | POA: Diagnosis not present

## 2019-11-24 DIAGNOSIS — R140 Abdominal distension (gaseous): Secondary | ICD-10-CM | POA: Diagnosis not present

## 2019-11-24 NOTE — Telephone Encounter (Signed)
Patient called wanting Dr. Caryn Section to know she was in the hospital at [facility].  She couldn't eat or drink anything so she went by ambulance there.

## 2019-11-25 DIAGNOSIS — R69 Illness, unspecified: Secondary | ICD-10-CM | POA: Diagnosis not present

## 2019-11-25 DIAGNOSIS — R638 Other symptoms and signs concerning food and fluid intake: Secondary | ICD-10-CM | POA: Diagnosis not present

## 2019-11-25 DIAGNOSIS — R079 Chest pain, unspecified: Secondary | ICD-10-CM | POA: Diagnosis not present

## 2019-11-25 DIAGNOSIS — R1110 Vomiting, unspecified: Secondary | ICD-10-CM | POA: Diagnosis not present

## 2019-11-25 DIAGNOSIS — Z4682 Encounter for fitting and adjustment of non-vascular catheter: Secondary | ICD-10-CM | POA: Diagnosis not present

## 2019-11-25 DIAGNOSIS — R197 Diarrhea, unspecified: Secondary | ICD-10-CM | POA: Diagnosis not present

## 2019-11-25 DIAGNOSIS — J449 Chronic obstructive pulmonary disease, unspecified: Secondary | ICD-10-CM | POA: Diagnosis not present

## 2019-11-25 DIAGNOSIS — J9601 Acute respiratory failure with hypoxia: Secondary | ICD-10-CM | POA: Diagnosis not present

## 2019-11-25 DIAGNOSIS — C3491 Malignant neoplasm of unspecified part of right bronchus or lung: Secondary | ICD-10-CM | POA: Diagnosis not present

## 2019-11-25 DIAGNOSIS — R110 Nausea: Secondary | ICD-10-CM | POA: Diagnosis not present

## 2019-11-25 DIAGNOSIS — Z20822 Contact with and (suspected) exposure to covid-19: Secondary | ICD-10-CM | POA: Diagnosis not present

## 2019-11-25 DIAGNOSIS — E119 Type 2 diabetes mellitus without complications: Secondary | ICD-10-CM | POA: Diagnosis not present

## 2019-11-25 DIAGNOSIS — G894 Chronic pain syndrome: Secondary | ICD-10-CM | POA: Diagnosis not present

## 2019-11-25 DIAGNOSIS — K2289 Other specified disease of esophagus: Secondary | ICD-10-CM | POA: Diagnosis not present

## 2019-11-25 DIAGNOSIS — K222 Esophageal obstruction: Secondary | ICD-10-CM | POA: Diagnosis not present

## 2019-11-25 DIAGNOSIS — Z7984 Long term (current) use of oral hypoglycemic drugs: Secondary | ICD-10-CM | POA: Diagnosis not present

## 2019-11-25 DIAGNOSIS — T18128A Food in esophagus causing other injury, initial encounter: Secondary | ICD-10-CM | POA: Diagnosis not present

## 2019-11-25 DIAGNOSIS — R296 Repeated falls: Secondary | ICD-10-CM | POA: Diagnosis not present

## 2019-11-25 DIAGNOSIS — R918 Other nonspecific abnormal finding of lung field: Secondary | ICD-10-CM | POA: Diagnosis not present

## 2019-11-25 DIAGNOSIS — I10 Essential (primary) hypertension: Secondary | ICD-10-CM | POA: Diagnosis not present

## 2019-11-25 DIAGNOSIS — R1310 Dysphagia, unspecified: Secondary | ICD-10-CM | POA: Diagnosis not present

## 2019-11-25 DIAGNOSIS — R112 Nausea with vomiting, unspecified: Secondary | ICD-10-CM | POA: Diagnosis not present

## 2019-11-25 DIAGNOSIS — Z923 Personal history of irradiation: Secondary | ICD-10-CM | POA: Diagnosis not present

## 2019-11-25 DIAGNOSIS — Z9911 Dependence on respirator [ventilator] status: Secondary | ICD-10-CM | POA: Diagnosis not present

## 2019-11-26 DIAGNOSIS — E119 Type 2 diabetes mellitus without complications: Secondary | ICD-10-CM | POA: Diagnosis not present

## 2019-11-26 DIAGNOSIS — R079 Chest pain, unspecified: Secondary | ICD-10-CM | POA: Diagnosis not present

## 2019-11-26 DIAGNOSIS — T18128A Food in esophagus causing other injury, initial encounter: Secondary | ICD-10-CM | POA: Diagnosis not present

## 2019-11-26 DIAGNOSIS — Z4682 Encounter for fitting and adjustment of non-vascular catheter: Secondary | ICD-10-CM | POA: Diagnosis not present

## 2019-11-26 DIAGNOSIS — I10 Essential (primary) hypertension: Secondary | ICD-10-CM | POA: Diagnosis not present

## 2019-11-26 DIAGNOSIS — J449 Chronic obstructive pulmonary disease, unspecified: Secondary | ICD-10-CM | POA: Diagnosis not present

## 2019-11-26 DIAGNOSIS — Z9911 Dependence on respirator [ventilator] status: Secondary | ICD-10-CM | POA: Diagnosis not present

## 2019-11-26 DIAGNOSIS — R69 Illness, unspecified: Secondary | ICD-10-CM | POA: Diagnosis not present

## 2019-11-26 DIAGNOSIS — R110 Nausea: Secondary | ICD-10-CM | POA: Diagnosis not present

## 2019-11-26 DIAGNOSIS — K222 Esophageal obstruction: Secondary | ICD-10-CM | POA: Diagnosis not present

## 2019-11-26 DIAGNOSIS — R638 Other symptoms and signs concerning food and fluid intake: Secondary | ICD-10-CM | POA: Diagnosis not present

## 2019-11-26 DIAGNOSIS — R918 Other nonspecific abnormal finding of lung field: Secondary | ICD-10-CM | POA: Diagnosis not present

## 2019-11-26 DIAGNOSIS — R1110 Vomiting, unspecified: Secondary | ICD-10-CM | POA: Diagnosis not present

## 2019-11-27 DIAGNOSIS — J9601 Acute respiratory failure with hypoxia: Secondary | ICD-10-CM | POA: Diagnosis not present

## 2019-11-27 DIAGNOSIS — R918 Other nonspecific abnormal finding of lung field: Secondary | ICD-10-CM | POA: Diagnosis not present

## 2019-11-27 DIAGNOSIS — R112 Nausea with vomiting, unspecified: Secondary | ICD-10-CM | POA: Diagnosis not present

## 2019-11-27 DIAGNOSIS — K2289 Other specified disease of esophagus: Secondary | ICD-10-CM | POA: Diagnosis not present

## 2019-11-27 DIAGNOSIS — K222 Esophageal obstruction: Secondary | ICD-10-CM | POA: Diagnosis not present

## 2019-11-27 DIAGNOSIS — C3491 Malignant neoplasm of unspecified part of right bronchus or lung: Secondary | ICD-10-CM | POA: Diagnosis not present

## 2019-11-27 DIAGNOSIS — Z9911 Dependence on respirator [ventilator] status: Secondary | ICD-10-CM | POA: Diagnosis not present

## 2019-11-27 DIAGNOSIS — T18128A Food in esophagus causing other injury, initial encounter: Secondary | ICD-10-CM | POA: Diagnosis not present

## 2019-11-28 DIAGNOSIS — J9601 Acute respiratory failure with hypoxia: Secondary | ICD-10-CM | POA: Diagnosis not present

## 2019-11-28 DIAGNOSIS — K222 Esophageal obstruction: Secondary | ICD-10-CM | POA: Diagnosis not present

## 2019-11-28 DIAGNOSIS — T18128A Food in esophagus causing other injury, initial encounter: Secondary | ICD-10-CM | POA: Diagnosis not present

## 2019-11-28 DIAGNOSIS — C3491 Malignant neoplasm of unspecified part of right bronchus or lung: Secondary | ICD-10-CM | POA: Diagnosis not present

## 2019-11-29 DIAGNOSIS — K222 Esophageal obstruction: Secondary | ICD-10-CM | POA: Diagnosis not present

## 2019-11-29 DIAGNOSIS — G894 Chronic pain syndrome: Secondary | ICD-10-CM | POA: Diagnosis not present

## 2019-11-29 DIAGNOSIS — R69 Illness, unspecified: Secondary | ICD-10-CM | POA: Diagnosis not present

## 2019-11-29 DIAGNOSIS — R1310 Dysphagia, unspecified: Secondary | ICD-10-CM | POA: Diagnosis not present

## 2019-11-29 DIAGNOSIS — J449 Chronic obstructive pulmonary disease, unspecified: Secondary | ICD-10-CM | POA: Diagnosis not present

## 2019-11-30 DIAGNOSIS — R69 Illness, unspecified: Secondary | ICD-10-CM | POA: Diagnosis not present

## 2019-11-30 DIAGNOSIS — G894 Chronic pain syndrome: Secondary | ICD-10-CM | POA: Diagnosis not present

## 2019-11-30 DIAGNOSIS — K222 Esophageal obstruction: Secondary | ICD-10-CM | POA: Diagnosis not present

## 2019-12-07 ENCOUNTER — Other Ambulatory Visit: Payer: Self-pay | Admitting: Family Medicine

## 2019-12-07 DIAGNOSIS — M5126 Other intervertebral disc displacement, lumbar region: Secondary | ICD-10-CM

## 2019-12-07 DIAGNOSIS — G8929 Other chronic pain: Secondary | ICD-10-CM

## 2019-12-07 DIAGNOSIS — M5442 Lumbago with sciatica, left side: Secondary | ICD-10-CM

## 2019-12-07 NOTE — Telephone Encounter (Signed)
Requested medication (s) are due for refill today: yes  Requested medication (s) are on the active medication list: yes  Last refill:  11/05/19  Future visit scheduled: no  Notes to clinic:  med not delegated to NT to RF   Requested Prescriptions  Pending Prescriptions Disp Refills   oxyCODONE-acetaminophen (PERCOCET) 10-325 MG tablet 180 tablet     Sig: One tablet every 4-6 hours as needed.      Not Delegated - Analgesics:  Opioid Agonist Combinations Failed - 12/07/2019  4:08 PM      Failed - This refill cannot be delegated      Failed - Urine Drug Screen completed in last 360 days      Passed - Valid encounter within last 6 months    Recent Outpatient Visits           3 months ago [place], Donald E, MD   6 months ago Essential hypertension   [facility] Birdie Sons, MD   6 months ago No-show for appointment   [facility] Birdie Sons, MD   9 months ago Type 2 diabetes mellitus with microalbuminuria, without long-term current use of insulin [facility])   [facility] Birdie Sons, MD   1 year ago Other fatigue   [facility] Birdie Sons, MD               Refused Prescriptions Disp Refills   gabapentin (NEURONTIN) 600 MG tablet 360 tablet     Sig: Take one tablet up to 4 times a day      Neurology: Anticonvulsants - gabapentin Passed - 12/07/2019  4:08 PM      Passed - Valid encounter within last 12 months    Recent Outpatient Visits           3 months ago [place], Donald E, MD   6 months ago Essential hypertension   [facility] Birdie Sons, MD   6 months ago No-show for appointment   [facility] Birdie Sons, MD   9 months ago Type 2 diabetes mellitus with microalbuminuria, without long-term current use of insulin [facility])   [facility] Birdie Sons, MD   1 year ago Other fatigue   [facility] Birdie Sons, MD

## 2019-12-07 NOTE — Progress Notes (Signed)
Subjective:   Yvette Riley is a 64 y.o. female who presents for Medicare Annual (Subsequent) preventive examination.  I connected with Yvette Riley today by telephone and verified that I am speaking with the correct person using two identifiers. Location patient: home Location provider: work Persons participating in the virtual visit: patient, provider.   I discussed the limitations, risks, security and privacy concerns of performing an evaluation and management service by telephone and the availability of in person appointments. I also discussed with the patient that there may be a patient responsible charge related to this service. The patient expressed understanding and verbally consented to this telephonic visit.    Interactive audio and video telecommunications were attempted between this provider and patient, however failed, due to patient having technical difficulties OR patient did not have access to video capability.  We continued and completed visit with audio only.   Review of Systems    N/A  Cardiac Risk Factors include: advanced age (>84mn, >>25women);diabetes mellitus;hypertension;obesity (BMI >30kg/m2);dyslipidemia     Objective:    Today's Vitals   12/08/19 1102  PainSc: 7    There is no height or weight on file to calculate BMI.  Advanced Directives 12/08/2019 03/16/2019 01/28/2019 01/15/2019 11/25/2018 07/21/2018 06/17/2018  Does Patient Have a Medical Advance Directive? _0  No No  Type of Advance Directive - - - - - - -  Does patient want to make changes to medical advance directive? - - - - - - -  Copy of [place] Chart? - - - - - - -  Would patient like information on creating a medical advance directive? No - Patient declined - Yes (MAU/Ambulatory/Procedural Areas - Information given) No - Patient declined No - Patient declined No - Patient declined No - Patient declined    Current Medications (verified) Outpatient  Encounter Medications as of 12/08/2019  Medication Sig   albuterol (VENTOLIN HFA) 108 (90 Base) MCG/ACT inhaler Inhale 2 puffs into the lungs every 6 (six) hours as needed for wheezing or shortness of breath.   ANORO ELLIPTA 62.5-25 MCG/INH AEPB Inhale 1 puff into the lungs daily.   clonazePAM (KLONOPIN) 0.5 MG tablet Take 1 tablet by mouth twice daily as needed   clopidogrel (PLAVIX) 75 MG tablet Take 1 tablet (75 mg total) by mouth daily.   ezetimibe (ZETIA) 10 MG tablet Take 1 tablet by mouth once daily   gabapentin (NEURONTIN) 600 MG tablet Take one tablet up to 4 times a day   glucose blood test strip Contour test strip. Use to check blood sugar once a day for for type 2 diabetes (E11.9)   Lancets MISC Use to check blood sugar once daily for type 2 diabetes E11.9   lisinopril (ZESTRIL) 10 MG tablet Take 10 mg by mouth daily.    meloxicam (MOBIC) 15 MG tablet TAKE 1 TABLET BY MOUTH ONCE DAILY AS NEEDED FOR PAIN   metFORMIN (GLUCOPHAGE-XR) 500 MG 24 hr tablet Take 1 tablet (500 mg total) by mouth 2 (two) times daily with a meal.   metoprolol succinate (TOPROL-XL) 25 MG 24 hr tablet Take 1 tablet (25 mg total) by mouth daily.   montelukast (SINGULAIR) 10 MG tablet Take 1 tablet (10 mg total) by mouth at bedtime.   NARCAN 4 MG/0.1ML LIQD nasal spray kit Place 0.4 mg into the nose once.    ondansetron (ZOFRAN ODT) 4 MG disintegrating tablet Take 1 tablet (4 mg total) by mouth  every 8 (eight) hours as needed for nausea or vomiting.   oxyCODONE-acetaminophen (PERCOCET) 10-325 MG tablet One tablet every 4-6 hours as needed.   sertraline (ZOLOFT) 50 MG tablet Take 1 tablet (50 mg total) by mouth daily.   sucralfate (CARAFATE) 1 g tablet TAKE 1 TABLET BY MOUTH 4 TIMES DAILY WITH MEALS AND AT BEDTIME   Tiotropium Bromide-Olodaterol 2.5-2.5 MCG/ACT AERS Inhale 2 puffs into the lungs daily.   zaleplon (SONATA) 10 MG capsule Take 1 capsule (10 mg total) by mouth at bedtime. MAY TAKE  SECOND CAPSULE AFTER 2 HOURS IF NEEDED   furosemide (LASIX) 20 MG tablet  (Patient not taking: Reported on 12/08/2019)   [DISCONTINUED] oxyCODONE-acetaminophen (PERCOCET) 10-325 MG tablet One tablet every 4-6 hours as needed.   No facility-administered encounter medications on file as of 12/08/2019.    Allergies (verified) Codeine, Other, Penicillins, Yellow jacket venom [bee venom], Crestor [rosuvastatin], Gemfibrozil, Trazodone and nefazodone, and Lipitor [atorvastatin]   History: Past Medical History:  Diagnosis Date   Allergy    Anemia    Anxiety    Arthritis    Asthma    Blood transfusion without reported diagnosis    C. difficile diarrhea 04/07/2016   CAP (community acquired pneumonia) 03/21/2015   Combined forms of age-related cataract of both eyes 05/06/2016   COPD (chronic obstructive pulmonary disease) ([place])    CVA (cerebral vascular accident) ([place]) 06/17/2018   Diabetes mellitus without complication ([place])    Displacement of lumbar intervertebral disc without myelopathy    Emphysema of lung ([facility])    GERD (gastroesophageal reflux disease)    Glaucoma    History of chicken pox    Hyperlipidemia    Hypertension    Pneumonia 03/29/2015   Sepsis ([place]) 03/17/2016   Shortness of breath    Small cell lung cancer ([place])    Past Surgical History:  Procedure Laterality Date   CESAREAN SECTION     TUBAL LIGATION     UPPER GI ENDOSCOPY     Family History  Problem Relation Age of Onset   Heart failure Mother    Heart disease Mother    Stroke Mother    Heart disease Brother    COPD Brother    Cancer Maternal Aunt        Breast Cancer   Social History   Socioeconomic History   Marital status: Widowed    Spouse name: Not on file   Number of children: 2   Years of education: Not on file   Highest education level: 10th grade  Occupational History   Occupation: disable  Tobacco Use   Smoking status: Former Smoker    Packs/day: 0.25     Years: 45.00    Pack years: 11.25    Types: Cigarettes    Quit date: 03/17/2016    Years since quitting: 3.7   Smokeless tobacco: Never Used   Tobacco comment: pt states she quit in 2016-2017  Vaping Use   Vaping Use: Never used  Substance and Sexual Activity   Alcohol use: No    Alcohol/week: 0.0 standard drinks   Drug use: No   Sexual activity: Not Currently    Birth control/protection: Abstinence  Other Topics Concern   Not on file  Social History Narrative   Not on file   Social Determinants of Health   Financial Resource Strain: Medium Risk   Difficulty of Paying Living Expenses: Somewhat hard  Food Insecurity: No Food Insecurity   Worried About Running Out of  Food in the Last Year: Never true   [place] in the Last Year: Never true  Transportation Needs: No Transportation Needs   Lack of Transportation (Medical): No   Lack of Transportation (Non-Medical): No  Physical Activity: Inactive   Days of Exercise per Week: 0 days   Minutes of Exercise per Session: 0 min  Stress: No Stress Concern Present   Feeling of Stress : Not at all  Social Connections: Socially Isolated   Frequency of Communication with Friends and Family: More than three times a week   Frequency of Social Gatherings with Friends and Family: More than three times a week   Attends Religious Services: Never   Marine scientist or Organizations: No   Attends Archivist Meetings: Never   Marital Status: Widowed    Tobacco Counseling Counseling given: Not Answered Comment: pt states she quit in 2016-2017   Clinical Intake:  Pre-visit preparation completed: Yes  Pain : 0-10 Pain Score: 7  Pain Type: Chronic pain Pain Location: Leg (both legs and lower back) Pain Descriptors / Indicators: Aching Pain Frequency: Constant Pain Relieving Factors: Taking Oxycodone and Tylenol as needed for pain.  Pain Relieving Factors: Taking Oxycodone and Tylenol as  needed for pain.  Nutritional Risks: None Diabetes: Yes  How often do you need to have someone help you when you read instructions, pamphlets, or other written materials from your doctor or pharmacy?: 1 - Never  Diabetic? Yes  Nutrition Risk Assessment:  Has the patient had any N/V/D within the last 2 months?  No  Does the patient have any non-healing wounds?  No  Has the patient had any unintentional weight loss or weight gain?  No   Diabetes:  Is the patient diabetic?  Yes  If diabetic, was a CBG obtained today?  No  Did the patient bring in their glucometer from home?  No  How often do you monitor your CBG's? Twice a day.   Financial Strains and Diabetes Management:  Are you having any financial strains with the device, your supplies or your medication? No .  Does the patient want to be seen by Chronic Care Management for management of their diabetes?  No  Would the patient like to be referred to a Nutritionist or for Diabetic Management?  No   Diabetic Exams:  Diabetic Eye Exam: Overdue for diabetic eye exam. Pt has been advised about the importance in completing this exam. Patient advised to call and schedule an eye exam. Diabetic Foot Exam: Overdue, Pt has been advised about the importance in completing this exam. Note made to follow on this at next in office apt.    Interpreter Needed?: No  Information entered by :: [facility], LPN   Activities of Daily Living In your present state of health, do you have any difficulty performing the following activities: 12/08/2019  Hearing? N  Vision? N  Difficulty concentrating or making decisions? N  Walking or climbing stairs? N  Dressing or bathing? N  Doing errands, shopping? N  Preparing Food and eating ? N  Using the Toilet? N  Do you have problems with loss of bowel control? N  Managing your Medications? N  Managing your Finances? N  Housekeeping or managing your Housekeeping? Y  Comment Has assistance with  housework.  Some recent data might be hidden    Patient Care Team: Birdie Sons, MD as PCP - General (Family Medicine) Lorelee Cover., MD (Ophthalmology) Vladimir Crofts,  MD as Consulting Physician (Neurology) Dionisio David, MD as Consulting Physician (Cardiology) Lloyd Huger, MD as Consulting Physician (Oncology) Noreene Filbert, MD as Referring Physician (Radiation Oncology)  Indicate any recent Medical Services you may have received from other than [facility] providers in the past year (date may be approximate).     Assessment:   This is a routine wellness examination for Yvette Riley.  Hearing/Vision screen No exam data present  Dietary issues and exercise activities discussed: Current Exercise Habits: The patient does not participate in regular exercise at present, Exercise limited by: orthopedic condition(s)  Goals      Patient Stated     'I am old and warn out and need help finding a place to go" (pt-stated)      Current Barriers:   Level of care concerns  Clinical Social Work Clinical Goal(s):   Over the next 90 days, client will work with SW to address concerns related to level of care concerns  Interventions:  Continued to explore patient's level of care needs  Confirmed that patient has now decided against facility care placement at this time  Contacted patient's provider to cancel request for the [facility]  Explored patient's current functioning and confirmed with patient her improved ability to take care of her own ADL's and IADL's with moderate assistance  Discussed with patient available network of support and confirmed that patient has hired a Actuary 3 days a week. She also discussed having a friend that will check on her regularly.  Discussed plans with patient for ongoing care management follow up if needed in the future and provided patient with direct contact information for care management team   Patient Self Care Activities:   Performs ADL's  independently  Performs IADL's independently  Please see past updates related to this goal by clicking on the "Past Updates" button in the selected goal        Other     Prevent falls      Recommend to remove any items from the home that may cause slips or trips.       Depression Screen PHQ 2/9 Scores 12/08/2019 11/25/2018 08/10/2018 11/20/2017 10/03/2017 10/01/2016 05/29/2016  PHQ - 2 Score 0 0 2 0 1 0 1  PHQ- 9 Score - - _0 - 7    Fall Risk Fall Risk  12/08/2019 11/25/2018 11/20/2017 12/04/2016 10/01/2016  Falls in the past year? 1 1 Yes No Yes  Number falls in past yr: _1 or more - 2 or more  Injury with Fall? 0 0 No - No  Risk Factor Category  - - High Fall Risk - High Fall Risk  Risk for fall due to : Impaired balance/gait - Impaired mobility;History of fall(s) - History of fall(s);Impaired balance/gait  Follow up Falls prevention discussed Falls prevention discussed Falls prevention discussed - Falls evaluation completed    Any stairs in or around the home? Yes  If so, are there any without handrails? No  Home free of loose throw rugs in walkways, pet beds, electrical cords, etc? Yes  Adequate lighting in your home to reduce risk of falls? Yes   ASSISTIVE DEVICES UTILIZED TO PREVENT FALLS:  Life alert? No  Use of a cane, walker or w/c? Yes  Grab bars in the bathroom? Yes  Shower chair or bench in shower? Yes  Elevated toilet seat or a handicapped toilet? No    Cognitive Function: Declined today.     6CIT Screen 11/20/2017  What  Year? 0 points  What month? 0 points  What time? 0 points  Count back from 20 0 points  Months in reverse 0 points  Repeat phrase 0 points  Total Score 0    Immunizations Immunization History  Administered Date(s) Administered   Influenza,inj,Quad PF,6+ Mos 12/13/2014, 11/23/2016, 10/13/2017, 12/04/2018   Influenza-Unspecified 01/28/2014   Moderna SARS-COVID-2 Vaccination 08/05/2019   Pneumococcal Polysaccharide-23  02/24/2014    TDAP status: Due, Education has been provided regarding the importance of this vaccine. Advised may receive this vaccine at local pharmacy or Health Dept. Aware to provide a copy of the vaccination record if obtained from local pharmacy or Health Dept. Verbalized acceptance and understanding. Flu Vaccine status: Declined, Education has been provided regarding the importance of this vaccine but patient still declined. Advised may receive this vaccine at local pharmacy or Health Dept. Aware to provide a copy of the vaccination record if obtained from local pharmacy or Health Dept. Verbalized acceptance and understanding. Covid-19 vaccine status: Completed vaccines  Qualifies for Shingles Vaccine? Yes   Zostavax completed No   Shingrix Completed?: No.    Education has been provided regarding the importance of this vaccine. Patient has been advised to call insurance company to determine out of pocket expense if they have not yet received this vaccine. Advised may also receive vaccine at local pharmacy or Health Dept. Verbalized acceptance and understanding.  Screening Tests Health Maintenance  Topic Date Due   Hepatitis C Screening  Never done   FOOT EXAM  Never done   OPHTHALMOLOGY EXAM  Never done   PAP SMEAR-Modifier  Never done   COLONOSCOPY  Never done   MAMMOGRAM  01/02/2015   INFLUENZA VACCINE  08/29/2019   COVID-19 Vaccine (2 - Moderna 2-dose series) 09/02/2019   TETANUS/TDAP  12/07/2020 (Originally 10/01/1974)   HEMOGLOBIN A1C  03/09/2020   PNEUMOCOCCAL POLYSACCHARIDE VACCINE AGE 67-64 HIGH RISK  Completed   HIV Screening  Completed    Health Maintenance  Health Maintenance Due  Topic Date Due   Hepatitis C Screening  Never done   FOOT EXAM  Never done   OPHTHALMOLOGY EXAM  Never done   PAP SMEAR-Modifier  Never done   COLONOSCOPY  Never done   MAMMOGRAM  01/02/2015   INFLUENZA VACCINE  08/29/2019   COVID-19 Vaccine (2 - Moderna 2-dose  series) 09/02/2019    Colorectal cancer screening: Declined colonoscopy referral. Cologuard order placed today.  Mammogram status: Ordered today. Pt provided with contact info and advised to call to schedule appt.   Lung Cancer Screening: (Low Dose CT Chest recommended if Age 64-80 years, 30 pack-year currently smoking OR have quit w/in 15years.) does qualify however has this scan scheduled for 01/24/20.  Additional Screening:  Hepatitis C Screening: does qualify and would like this added to next in office blood work orders.   Vision Screening: Recommended annual ophthalmology exams for early detection of glaucoma and other disorders of the eye. Is the patient up to date with their annual eye exam?  Yes  Who is the provider or what is the name of the office in which the patient attends annual eye exams? Dr Gloriann Loan If pt is not established with a provider, would they like to be referred to a provider to establish care? No .   Dental Screening: Recommended annual dental exams for proper oral hygiene  Community Resource Referral / Chronic Care Management: CRR required this visit? Yes, for assistance with getting dentures.   CCM required this visit?  No      Plan:     I have personally reviewed and noted the following in the patients chart:    Medical and social history  Use of alcohol, tobacco or illicit drugs   Current medications and supplements  Functional ability and status  Nutritional status  Physical activity  Advanced directives  List of other physicians  Hospitalizations, surgeries, and ER visits in previous 12 months  Vitals  Screenings to include cognitive, depression, and falls  Referrals and appointments  In addition, I have reviewed and discussed with patient certain preventive protocols, quality metrics, and best practice recommendations. A written personalized care plan for preventive services as well as general preventive health recommendations were  provided to patient.     Eila Runyan Coleman, [place]   [id number]/56/[id number]   Nurse Notes: Pt would like the Hep C lab order added to next in office blood work orders. Pt needs a diabetic foot exam and pap smear completed. Pt plans to schedule an eye exam with Dr Gloriann Loan. Cologuard and mammogram orders placed today. Pt declined a colonoscopy referral or a future flu shot. Pt has receive both Moderna vaccines but it unsure of the dates received. Pt to call back with these dates.

## 2019-12-07 NOTE — Telephone Encounter (Signed)
Requested Prescriptions  Pending Prescriptions Disp Refills  . gabapentin (NEURONTIN) 600 MG tablet 360 tablet     Sig: Take one tablet up to 4 times a day     Neurology: Anticonvulsants - gabapentin Passed - 12/07/2019  4:08 PM      Passed - Valid encounter within last 12 months    Recent Outpatient Visits          3 months ago [place], Donald E, MD   6 months ago Essential hypertension   [facility] Birdie Sons, MD   6 months ago No-show for appointment   [facility] Birdie Sons, MD   9 months ago Type 2 diabetes mellitus with microalbuminuria, without long-term current use of insulin [facility])   [facility] Birdie Sons, MD   1 year ago Other fatigue   [facility] Birdie Sons, MD             . oxyCODONE-acetaminophen (PERCOCET) 10-325 MG tablet 180 tablet     Sig: One tablet every 4-6 hours as needed.     Not Delegated - Analgesics:  Opioid Agonist Combinations Failed - 12/07/2019  4:08 PM      Failed - This refill cannot be delegated      Failed - Urine Drug Screen completed in last 360 days      Passed - Valid encounter within last 6 months    Recent Outpatient Visits          3 months ago [place], Donald E, MD   6 months ago Essential hypertension   [facility] Birdie Sons, MD   6 months ago No-show for appointment   [facility] Birdie Sons, MD   9 months ago Type 2 diabetes mellitus with microalbuminuria, without long-term current use of insulin [facility])   [facility] Birdie Sons, MD   1 year ago Other fatigue   [facility] Birdie Sons, MD

## 2019-12-07 NOTE — Telephone Encounter (Signed)
Medication Refill - Medication: oxyCODONE-acetaminophen (PERCOCET) 10-325 MG tablet gabapentin (NEURONTIN) 600 MG tablet      Preferred Pharmacy (with phone number or street name):  [facility] [place] [address] Phone:  [phone]  Fax:  [phone]       Agent: Please be advised that RX refills may take up to 3 business days. We ask that you follow-up with your pharmacy.

## 2019-12-08 ENCOUNTER — Other Ambulatory Visit: Payer: Self-pay

## 2019-12-08 ENCOUNTER — Ambulatory Visit (INDEPENDENT_AMBULATORY_CARE_PROVIDER_SITE_OTHER): Payer: Medicare HMO

## 2019-12-08 DIAGNOSIS — Z0000 Encounter for general adult medical examination without abnormal findings: Secondary | ICD-10-CM

## 2019-12-08 DIAGNOSIS — Z1211 Encounter for screening for malignant neoplasm of colon: Secondary | ICD-10-CM | POA: Diagnosis not present

## 2019-12-08 DIAGNOSIS — Z1231 Encounter for screening mammogram for malignant neoplasm of breast: Secondary | ICD-10-CM

## 2019-12-08 DIAGNOSIS — Z599 Problem related to housing and economic circumstances, unspecified: Secondary | ICD-10-CM

## 2019-12-08 MED ORDER — OXYCODONE-ACETAMINOPHEN 10-325 MG PO TABS: ORAL_TABLET | ORAL | 0 refills | Status: DC

## 2019-12-08 NOTE — Patient Instructions (Signed)
Yvette Riley , Thank you for taking time to come for your Medicare Wellness Visit. I appreciate your ongoing commitment to your health goals. Please review the following plan we discussed and let me know if I can assist you in the future.   Screening recommendations/referrals: Colonoscopy: Cologuard ordered today.  Mammogram: Ordered today. Pt aware she need to contact the office to schedule apt.  Recommended yearly ophthalmology/optometry visit for glaucoma screening and checkup Recommended yearly dental visit for hygiene and checkup  Vaccinations: Influenza vaccine: Currently due, declined receiving.  Tdap vaccine: Currently due, declined receiving.  Shingles vaccine: Shingrix discussed. Please contact your pharmacy for coverage information.     Advanced directives: Please bring a copy of your POA (Power of Attorney) and/or Living Will to your next appointment.   Conditions/risks identified: Fall risk preventatives discussed today.   Next appointment: 12/22/19 @ 8:00 AM with Dr Caryn Section.   Preventive Care 40-64 Years, Female Preventive care refers to lifestyle choices and visits with your health care provider that can promote health and wellness. What does preventive care include?  A yearly physical exam. This is also called an annual well check.  Dental exams once or twice a year.  Routine eye exams. Ask your health care provider how often you should have your eyes checked.  Personal lifestyle choices, including:  Daily care of your teeth and gums.  Regular physical activity.  Eating a healthy diet.  Avoiding tobacco and drug use.  Limiting alcohol use.  Practicing safe sex.  Taking low-dose aspirin daily starting at age 3.  Taking vitamin and mineral supplements as recommended by your health care provider. What happens during an annual well check? The services and screenings done by your health care provider during your annual well check will depend on your age,  overall health, lifestyle risk factors, and family history of disease. Counseling  Your health care provider may ask you questions about your:  Alcohol use.  Tobacco use.  Drug use.  Emotional well-being.  Home and relationship well-being.  Sexual activity.  Eating habits.  Work and work Statistician.  Method of birth control.  Menstrual cycle.  Pregnancy history. Screening  You may have the following tests or measurements:  Height, weight, and BMI.  Blood pressure.  Lipid and cholesterol levels. These may be checked every 5 years, or more frequently if you are over 71 years old.  Skin check.  Lung cancer screening. You may have this screening every year starting at age 35 if you have a 30-pack-year history of smoking and currently smoke or have quit within the past 15 years.  Fecal occult blood test (FOBT) of the stool. You may have this test every year starting at age 44.  Flexible sigmoidoscopy or colonoscopy. You may have a sigmoidoscopy every 5 years or a colonoscopy every 10 years starting at age 49.  Hepatitis C blood test.  Hepatitis B blood test.  Sexually transmitted disease (STD) testing.  Diabetes screening. This is done by checking your blood sugar (glucose) after you have not eaten for a while (fasting). You may have this done every 1-3 years.  Mammogram. This may be done every 1-2 years. Talk to your health care provider about when you should start having regular mammograms. This may depend on whether you have a family history of breast cancer.  BRCA-related cancer screening. This may be done if you have a family history of breast, ovarian, tubal, or peritoneal cancers.  Pelvic exam and Pap test. This may  be done every 3 years starting at age 56. Starting at age 91, this may be done every 5 years if you have a Pap test in combination with an HPV test.  Bone density scan. This is done to screen for osteoporosis. You may have this scan if you are at  high risk for osteoporosis. Discuss your test results, treatment options, and if necessary, the need for more tests with your health care provider. Vaccines  Your health care provider may recommend certain vaccines, such as:  Influenza vaccine. This is recommended every year.  Tetanus, diphtheria, and acellular pertussis (Tdap, Td) vaccine. You may need a Td booster every 10 years.  Zoster vaccine. You may need this after age 41.  Pneumococcal 13-valent conjugate (PCV13) vaccine. You may need this if you have certain conditions and were not previously vaccinated.  Pneumococcal polysaccharide (PPSV23) vaccine. You may need one or two doses if you smoke cigarettes or if you have certain conditions. Talk to your health care provider about which screenings and vaccines you need and how often you need them. This information is not intended to replace advice given to you by your health care provider. Make sure you discuss any questions you have with your health care provider. Document Released: 02/10/2015 Document Revised: 10/04/2015 Document Reviewed: 11/15/2014 Elsevier Interactive Patient Education  2017 [place] Prevention in the Home Falls can cause injuries. They can happen to people of all ages. There are many things you can do to make your home safe and to help prevent falls. What can I do on the outside of my home?  Regularly fix the edges of walkways and driveways and fix any cracks.  Remove anything that might make you trip as you walk through a door, such as a raised step or threshold.  Trim any bushes or trees on the path to your home.  Use bright outdoor lighting.  Clear any walking paths of anything that might make someone trip, such as rocks or tools.  Regularly check to see if handrails are loose or broken. Make sure that both sides of any steps have handrails.  Any raised decks and porches should have guardrails on the edges.  Have any leaves, snow, or  ice cleared regularly.  Use sand or salt on walking paths during winter.  Clean up any spills in your garage right away. This includes oil or grease spills. What can I do in the bathroom?  Use night lights.  Install grab bars by the toilet and in the tub and shower. Do not use towel bars as grab bars.  Use non-skid mats or decals in the tub or shower.  If you need to sit down in the shower, use a plastic, non-slip stool.  Keep the floor dry. Clean up any water that spills on the floor as soon as it happens.  Remove soap buildup in the tub or shower regularly.  Attach bath mats securely with double-sided non-slip rug tape.  Do not have throw rugs and other things on the floor that can make you trip. What can I do in the bedroom?  Use night lights.  Make sure that you have a light by your bed that is easy to reach.  Do not use any sheets or blankets that are too big for your bed. They should not hang down onto the floor.  Have a firm chair that has side arms. You can use this for support while you get dressed.  Do  not have throw rugs and other things on the floor that can make you trip. What can I do in the kitchen?  Clean up any spills right away.  Avoid walking on wet floors.  Keep items that you use a lot in easy-to-reach places.  If you need to reach something above you, use a strong step stool that has a grab bar.  Keep electrical cords out of the way.  Do not use floor polish or wax that makes floors slippery. If you must use wax, use non-skid floor wax.  Do not have throw rugs and other things on the floor that can make you trip. What can I do with my stairs?  Do not leave any items on the stairs.  Make sure that there are handrails on both sides of the stairs and use them. Fix handrails that are broken or loose. Make sure that handrails are as long as the stairways.  Check any carpeting to make sure that it is firmly attached to the stairs. Fix any carpet  that is loose or worn.  Avoid having throw rugs at the top or bottom of the stairs. If you do have throw rugs, attach them to the floor with carpet tape.  Make sure that you have a light switch at the top of the stairs and the bottom of the stairs. If you do not have them, ask someone to add them for you. What else can I do to help prevent falls?  Wear shoes that:  Do not have high heels.  Have rubber bottoms.  Are comfortable and fit you well.  Are closed at the toe. Do not wear sandals.  If you use a stepladder:  Make sure that it is fully opened. Do not climb a closed stepladder.  Make sure that both sides of the stepladder are locked into place.  Ask someone to hold it for you, if possible.  Clearly mark and make sure that you can see:  Any grab bars or handrails.  First and last steps.  Where the edge of each step is.  Use tools that help you move around (mobility aids) if they are needed. These include:  Canes.  Walkers.  Scooters.  Crutches.  Turn on the lights when you go into a dark area. Replace any light bulbs as soon as they burn out.  Set up your furniture so you have a clear path. Avoid moving your furniture around.  If any of your floors are uneven, fix them.  If there are any pets around you, be aware of where they are.  Review your medicines with your doctor. Some medicines can make you feel dizzy. This can increase your chance of falling. Ask your doctor what other things that you can do to help prevent falls. This information is not intended to replace advice given to you by your health care provider. Make sure you discuss any questions you have with your health care provider. Document Released: 11/10/2008 Document Revised: 06/22/2015 Document Reviewed: 02/18/2014 Elsevier Interactive Patient Education  2017 [employer].

## 2019-12-15 ENCOUNTER — Telehealth: Payer: Self-pay

## 2019-12-15 NOTE — Telephone Encounter (Signed)
Copied from [place] [phone]. Topic: Quick Communication - See Telephone Encounter >> Dec 15, 2019  2:20 PM Loma Boston wrote: CRM for notification. See Telephone encounter for: 12/15/19.PT called in wanting an antibotic for pt sates UTI as has been in hospital. Pt was told that a sample was generally provided and pt refuses and wants to ask dr. To call in antibiotic.

## 2019-12-16 ENCOUNTER — Telehealth: Payer: Self-pay | Admitting: Family Medicine

## 2019-12-16 NOTE — Telephone Encounter (Signed)
   SF 12/16/2019   Name: Yvette Riley   MRN: [id number]   DOB: May 07, 1955   AGE: 64 y.o.   GENDER: female   PCP Birdie Sons, MD.   Spoke with patient today regarding [facility] Referral for assistance with dentures. Patient stated that she is still in need. Ms. Huyett also stated that she has spoken with her insurance and they will not pay for the dentures, but instead she can be reimbursed. Informed patient that Care Guide call [employer] in her area to see what type of programs they have that might be able to assist her. Patient stated understanding.   [place], Care Management Phone: [phone] Email: [email or web address]

## 2019-12-16 NOTE — Telephone Encounter (Signed)
Patient called, left VM to return the call to the office. 

## 2019-12-16 NOTE — Telephone Encounter (Signed)
Tried calling patient. Unable to leave message. Voice mailbox full. Patient needs office visit for evaluation of possible UTI. OK for [facility] triage to advise. Please screen for COVID symptoms before scheduling an "in person" office visit.

## 2019-12-17 ENCOUNTER — Ambulatory Visit
Admission: EM | Admit: 2019-12-17 | Discharge: 2019-12-17 | Disposition: A | Discharge status: Home / Self Care | Payer: Medicare HMO | Attending: Family Medicine | Admitting: Family Medicine

## 2019-12-17 ENCOUNTER — Other Ambulatory Visit: Payer: Self-pay

## 2019-12-17 DIAGNOSIS — N390 Urinary tract infection, site not specified: Secondary | ICD-10-CM | POA: Diagnosis not present

## 2019-12-17 LAB — URINALYSIS, COMPLETE (UACMP) WITH MICROSCOPIC
Bilirubin Urine: NEGATIVE
Glucose, UA: NEGATIVE mg/dL
Hgb urine dipstick: NEGATIVE
Nitrite: POSITIVE — AB
Protein, ur: NEGATIVE mg/dL
Specific Gravity, Urine: 1.02 (ref 1.005–1.030)
pH: 5 (ref 5.0–8.0)

## 2019-12-17 MED ORDER — SULFAMETHOXAZOLE-TRIMETHOPRIM 800-160 MG PO TABS: 1.0000 | ORAL_TABLET | Freq: Two times a day (BID) | ORAL | 0 refills | Status: AC

## 2019-12-17 NOTE — Discharge Instructions (Addendum)
Continue to increase your water intake so that you increase your urine production and flush your urinary tract.  Take the Bactrim twice daily with a full glass of water for 7 days.  Continue to use the over-the-counter Azo as needed for urinary discomfort.  If you develop a fever, back pain, nausea or vomiting and cannot keep down fluids or medications return for reevaluation or follow-up in the ER.

## 2019-12-17 NOTE — ED Provider Notes (Signed)
[facility]    CSN: [id number] Arrival date & time: 12/17/19  3893      History   Chief Complaint Chief Complaint  Patient presents with  . Urinary Tract Infection    HPI Yvette Riley is a 64 y.o. female.   HPI   64 year old female here for evaluation of increased urinary frequency, vaginal itching, and vaginal burning.  Patient reports that her symptoms started 3 days ago.  She denies vaginal discharge or fever.  Denies blood in her urine, back pain, abdominal pain, nausea, or vomiting.  Patient reports that she does have pain when she urinates and has been taking over-the-counter Azo which helps.  Patient was admitted to [facility] at the end of October to beginning of November for a food bolus impaction in her esophagus and underwent endoscopy.  Patient reports that while she was in the hospital she had a urinary catheter.  Patient was discharged around the first of the month.  Past Medical History:  Diagnosis Date  . Allergy   . Anemia   . Anxiety   . Arthritis   . Asthma   . Blood transfusion without reported diagnosis   . C. difficile diarrhea 04/07/2016  . CAP (community acquired pneumonia) 03/21/2015  . Combined forms of age-related cataract of both eyes 05/06/2016  . COPD (chronic obstructive pulmonary disease) ([place])   . CVA (cerebral vascular accident) ([place]) 06/17/2018  . Diabetes mellitus without complication ([place])   . Displacement of lumbar intervertebral disc without myelopathy   . Emphysema of lung ([employer])   . GERD (gastroesophageal reflux disease)   . Glaucoma   . History of chicken pox   . Hyperlipidemia   . Hypertension   . Pneumonia 03/29/2015  . Sepsis ([place]) 03/17/2016  . Shortness of breath   . Small cell lung cancer [facility])     Patient Active Problem List   Diagnosis Date Noted  . 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 History of COPD 05/18/2019  . 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 Chronic, continuous use of opioids 08/05/2018  . Cerebellar infarct ([place]) 06/17/2018  . Chronic cough 06/02/2018  . Anxiety 07/19/2016  . Small cell lung cancer, right ([place]) 05/13/2016  . Bilateral presbyopia 05/06/2016  . Combined forms of age-related cataract of both eyes 05/06/2016  . Dry eye syndrome of both lacrimal glands 05/06/2016  . Chemoprophylaxis 04/29/2016  . Severe obesity (BMI 35.0-39.9) with comorbidity ([place]) 04/16/2016  . Elevated d-dimer 04/06/2016  . Weakness generalized 04/06/2016  . Mass of upper lobe of right lung 03/01/2016  . Coronary atherosclerosis 02/29/2016  . Allergic rhinitis 09/27/2014  . Asthma 09/27/2014  . Anemia 09/27/2014  . Hyperglyceridemia, pure 09/27/2014  . Glaucoma 09/27/2014  . Hip pain 09/27/2014  . Right knee pain 09/27/2014  . Displacement of lumbar intervertebral disc without myelopathy 09/27/2014  . Thoracic back pain 09/27/2014  . Insomnia 08/11/2014  . COPD (chronic obstructive pulmonary disease) ([place]) 06/12/2014  . Type 2 diabetes mellitus ([place]) 06/12/2014  . Back pain 06/12/2014  . Smoking greater than 30 pack years 03/28/2014    Past Surgical History:  Procedure Laterality Date  . CESAREAN SECTION    . TUBAL LIGATION    . UPPER GI ENDOSCOPY      OB History    Gravida  2   Para  2   Term      Preterm      AB  Living        SAB      TAB      Ectopic      Multiple      Live Births               Home Medications    Prior to Admission medications   Medication Sig Start Date End Date Taking? Authorizing Provider  albuterol (VENTOLIN HFA) 108 (90 Base) MCG/ACT inhaler Inhale 2 puffs into the lungs every 6 (six) hours as needed for wheezing or shortness of breath. 09/08/19   Birdie Sons, MD  ANORO ELLIPTA 62.5-25 MCG/INH AEPB Inhale 1 puff into the lungs daily. 11/30/19   [provider]  clonazePAM Bobbye [place]) 0.5 MG tablet Take 1 tablet by mouth twice daily as needed  10/01/19   Birdie Sons, MD  clopidogrel (PLAVIX) 75 MG tablet Take 1 tablet (75 mg total) by mouth daily. 06/20/18   Gladstone Lighter, MD  ezetimibe (ZETIA) 10 MG tablet Take 1 tablet by mouth once daily 08/25/19   Birdie Sons, MD  gabapentin (NEURONTIN) 600 MG tablet Take one tablet up to 4 times a day 09/07/19   Birdie Sons, MD  glucose blood test strip Contour test strip. Use to check blood sugar once a day for for type 2 diabetes (E11.9) 05/12/19   Birdie Sons, MD  Lancets MISC Use to check blood sugar once daily for type 2 diabetes E11.9 05/12/19   Birdie Sons, MD  lisinopril (ZESTRIL) 10 MG tablet Take 10 mg by mouth daily.  12/11/18   [provider]  meloxicam (MOBIC) 15 MG tablet TAKE 1 TABLET BY MOUTH ONCE DAILY AS NEEDED FOR PAIN 04/27/18   Birdie Sons, MD  metFORMIN (GLUCOPHAGE-XR) 500 MG 24 hr tablet Take 1 tablet (500 mg total) by mouth 2 (two) times daily with a meal. 05/09/19   Birdie Sons, MD  metoprolol succinate (TOPROL-XL) 25 MG 24 hr tablet Take 1 tablet (25 mg total) by mouth daily. 08/06/19   Birdie Sons, MD  montelukast (SINGULAIR) 10 MG tablet Take 1 tablet (10 mg total) by mouth at bedtime. 10/03/17   Birdie Sons, MD  NARCAN 4 MG/0.1ML LIQD nasal spray kit Place 0.4 mg into the nose once.  07/07/17   [provider]  ondansetron (ZOFRAN ODT) 4 MG disintegrating tablet Take 1 tablet (4 mg total) by mouth every 8 (eight) hours as needed for nausea or vomiting. 03/16/19   Earleen Newport, MD  oxyCODONE-acetaminophen (PERCOCET) 10-325 MG tablet One tablet every 4-6 hours as needed. 12/08/19   Birdie Sons, MD  sertraline (ZOLOFT) 50 MG tablet Take 1 tablet (50 mg total) by mouth daily. 09/07/19   Birdie Sons, MD  sucralfate (CARAFATE) 1 g tablet TAKE 1 TABLET BY MOUTH 4 TIMES DAILY WITH MEALS AND AT BEDTIME 11/19/19   Birdie Sons, MD  sulfamethoxazole-trimethoprim (BACTRIM DS) 800-160 MG tablet Take 1 tablet by  mouth 2 (two) times daily for 7 days. 12/17/19 12/24/19  Margarette Canada, NP  Tiotropium Bromide-Olodaterol 2.5-2.5 MCG/ACT AERS Inhale 2 puffs into the lungs daily. 09/07/19   Birdie Sons, MD  zaleplon (SONATA) 10 MG capsule Take 1 capsule (10 mg total) by mouth at bedtime. MAY TAKE SECOND CAPSULE AFTER 2 HOURS IF NEEDED 11/05/19   Birdie Sons, MD    Family History Family History  Problem Relation Age of Onset  . Heart failure Mother   .  Heart disease Mother   . Stroke Mother   . Heart disease Brother   . COPD Brother   . Cancer Maternal Aunt        Breast Cancer    Social History Social History   Tobacco Use  . Smoking status: Former Smoker    Packs/day: 0.25    Years: 45.00    Pack years: 11.25    Types: Cigarettes    Quit date: 03/17/2016    Years since quitting: 3.7  . Smokeless tobacco: Never Used  . Tobacco comment: pt states she quit in 2016-2017  Vaping Use  . Vaping Use: Never used  Substance Use Topics  . Alcohol use: No    Alcohol/week: 0.0 standard drinks  . Drug use: No     Allergies   Codeine, Other, Penicillins, Yellow jacket venom [bee venom], Crestor [rosuvastatin], Gemfibrozil, Trazodone and nefazodone, and Lipitor [atorvastatin]   Review of Systems Review of Systems  Constitutional: Negative for activity change, appetite change and fever.  HENT: Negative for congestion, rhinorrhea and sore throat.   Respiratory: Negative for cough, shortness of breath and wheezing.   Gastrointestinal: Negative for abdominal pain, diarrhea, nausea and vomiting.  Genitourinary: Positive for dysuria, frequency, urgency and vaginal pain. Negative for hematuria and vaginal discharge.  Musculoskeletal: Negative for arthralgias and myalgias.  Skin: Negative for rash.  Hematological: Negative.   Psychiatric/Behavioral: Negative.      Physical Exam Triage Vital Signs ED Triage Vitals  Enc Vitals Group     BP 12/17/19 0858 112/86     Pulse Rate 12/17/19 0858  (!) 103     Resp 12/17/19 0858 20     Temp 12/17/19 0858 98.2 F (36.8 C)     Temp Source 12/17/19 0858 Oral     SpO2 12/17/19 0858 98 %     Weight --      Height --      Head Circumference --      Peak Flow --      Pain Score 12/17/19 0856 0     Pain Loc --      Pain Edu? --      Excl. in [place]? --    No data found.  Updated Vital Signs BP 112/86 (BP Location: Right Arm)   Pulse (!) 103   Temp 98.2 F (36.8 C) (Oral)   Resp 20   SpO2 98%   Visual Acuity Right Eye Distance:   Left Eye Distance:   Bilateral Distance:    Right Eye Near:   Left Eye Near:    Bilateral Near:     Physical Exam Vitals and nursing note reviewed.  Constitutional:      General: She is not in acute distress.    Appearance: Normal appearance.  HENT:     Head: Normocephalic and atraumatic.  Eyes:     General: No scleral icterus.    Extraocular Movements: Extraocular movements intact.     Conjunctiva/sclera: Conjunctivae normal.     Pupils: Pupils are equal, round, and reactive to light.  Cardiovascular:     Rate and Rhythm: Normal rate and regular rhythm.     Pulses: Normal pulses.     Heart sounds: Normal heart sounds.  Pulmonary:     Effort: Pulmonary effort is normal.     Breath sounds: Wheezing and rhonchi present.     Comments: Patient has diffuse rhonchi and wheezes but denies cough or shortness of breath.  Patient states that she has a history  of lung cancer. Abdominal:     Tenderness: There is no right CVA tenderness or left CVA tenderness.  Musculoskeletal:        General: No swelling or tenderness. Normal range of motion.  Skin:    General: Skin is warm.     Capillary Refill: Capillary refill takes less than 2 seconds.     Findings: No erythema or rash.  Neurological:     General: No focal deficit present.     Mental Status: She is alert and oriented to person, place, and time.  Psychiatric:        Mood and Affect: Mood normal.        Behavior: Behavior normal.         Thought Content: Thought content normal.        Judgment: Judgment normal.      UC Treatments / Results  Labs (all labs ordered are listed, but only abnormal results are displayed) Labs Reviewed  URINALYSIS, COMPLETE (UACMP) WITH MICROSCOPIC - Abnormal; Notable for the following components:      Result Value   APPearance HAZY (*)    Ketones, ur TRACE (*)    Nitrite POSITIVE (*)    Leukocytes,Ua SMALL (*)    Bacteria, UA MANY (*)    All other components within normal limits  URINE CULTURE    EKG   Radiology No results found.  Procedures Procedures (including critical care time)  Medications Ordered in UC Medications - No data to display  Initial Impression / Assessment and Plan / UC Course  I have reviewed the triage vital signs and the nursing notes.  Pertinent labs & imaging results that were available during my care of the patient were reviewed by me and considered in my medical decision making (see chart for details).   Patient here for evaluation of increased urinary frequency with vaginal itching and burning.  Patient denies any vaginal discharge.  Patient complaining of pain with urination and increased frequency.  She denies blood in her urine.  Denies back pain, nausea, or vomiting or, or fever.  Will check UA.  UA shows small leukocytes but nitrite positive.  0-5 squamous, 11-20 WBCs, many bacteria.  Will send urine for culture and treat patient with Bactrim given she has anaphylaxis to penicillins ruling out cephalosporins.   Final Clinical Impressions(s) / UC Diagnoses   Final diagnoses:  Lower urinary tract infectious disease     Discharge Instructions     Continue to increase your water intake so that you increase your urine production and flush your urinary tract.  Take the Bactrim twice daily with a full glass of water for 7 days.  Continue to use the over-the-counter Azo as needed for urinary discomfort.  If you develop a fever, back pain,  nausea or vomiting and cannot keep down fluids or medications return for reevaluation or follow-up in the ER.    ED Prescriptions    Medication Sig Dispense Auth. Provider   sulfamethoxazole-trimethoprim (BACTRIM DS) 800-160 MG tablet Take 1 tablet by mouth 2 (two) times daily for 7 days. 14 tablet Margarette Canada, NP     PDMP not reviewed this encounter.   Margarette Canada, NP 12/17/19 [phone]

## 2019-12-17 NOTE — Telephone Encounter (Signed)
Tried calling patient. Left message to call back. This is the 3rd unsuccessful attempt at reaching patient, and she has not returned our call.

## 2019-12-17 NOTE — ED Triage Notes (Signed)
Pt is here with vaginal burning and itching with frequency that started 3 days ago, pt has taken AZO to relieve discomfort.

## 2019-12-20 LAB — URINE CULTURE
Culture: >=100000 — AB
Special Requests: NORMAL

## 2019-12-21 NOTE — Progress Notes (Deleted)
Established patient visit   Patient: Yvette Riley   DOB: 08-05-1955   64 y.o. Female  MRN: [id number] Visit Date: 12/22/2019  Today's healthcare provider: Lelon Huh, MD   No chief complaint on file.  Subjective    HPI  Diabetes Mellitus Type II, Follow-up  Lab Results  Component Value Date   HGBA1C 6.4 (H) 09/07/2019   HGBA1C 6.3 (A) 02/24/2019   HGBA1C 6.4 (A) 09/22/2018   Wt Readings from Last 3 Encounters:  09/07/19 176 lb (79.8 kg)  06/04/19 168 lb 3.2 oz (76.3 kg)  03/16/19 175 lb 14.8 oz (79.8 kg)   Last seen for diabetes 3 months ago.  Management since then includes continue same medication. She reports {excellent/good/fair/poor:19665} compliance with treatment. She {is/is not:21021397} having side effects. {document side effects if present:1} Symptoms: {Yes/No:20286} fatigue {Yes/No:20286} foot ulcerations  {Yes/No:20286} appetite changes {Yes/No:20286} nausea  {Yes/No:20286} paresthesia of the feet  {Yes/No:20286} polydipsia  {Yes/No:20286} polyuria {Yes/No:20286} visual disturbances   {Yes/No:20286} vomiting     Home blood sugar records: {diabetes glucometry results:16657}  Episodes of hypoglycemia? {Yes/No:20286} {enter symptoms and frequency of symptoms if yes:1}   Current insulin regiment: none Most Recent Eye Exam: not UTD {Current exercise:16438:::1} {Current diet habits:16563:::1}  Pertinent Labs: Lab Results  Component Value Date   CHOL 185 06/17/2018   HDL 44 06/17/2018   LDLCALC 75 06/17/2018   LDLDIRECT 110 (H) 06/02/2018   TRIG 330 (H) 06/17/2018   CHOLHDL 4.2 06/17/2018   Lab Results  Component Value Date   NA 141 09/07/2019   K 4.3 09/07/2019   CREATININE 0.73 09/07/2019   GFRNONAA 89 09/07/2019   GFRAA 102 09/07/2019   GLUCOSE 126 (H) 09/07/2019     ---------------------------------------------------------------------------------------------------  Follow up for COPD:  The patient was last seen for this 3  months ago. Changes made at last visit include changing to - Tiotropium Bromide-Olodaterol 2.5-2.5 MCG/ACT AERS due to insurance no longer covering An[place].  She reports {excellent/good/fair/poor:19665} compliance with treatment. She feels that condition is {improved/worse/unchanged:3041574}. She {is/is not:21021397} having side effects. ***  -----------------------------------------------------------------------------------------  Follow up for Situational stress:  The patient was last seen for this 3 months ago. Changes made at last visit include increasing Zoloft from $RemoveBef'25mg'EkGGgCQsbE$  to $R'50mg'Hr$  daily.  She reports {excellent/good/fair/poor:19665} compliance with treatment. She feels that condition is {improved/worse/unchanged:3041574}. She {is/is not:21021397} having side effects. ***  -----------------------------------------------------------------------------------------    Follow up urgent care visit  Patient was seen at an urgent care for lower UTI on 12/17/2019. Treatment for this included prescribing Bactrim. She reports {DESC; EXCELLENT/GOOD/FAIR:19992} compliance with treatment. She reports this condition is {improved/worse/unchanged:3041574}.  -----------------------------------------------------------------------------------------  Follow up for chronic pain:  The patient was last seen for this 3 months ago. Changes made at last visit include none; refilled medication.  She reports {excellent/good/fair/poor:19665} compliance with treatment. She feels that condition is {improved/worse/unchanged:3041574}. She {is/is not:21021397} having side effects. ***  -----------------------------------------------------------------------------------------    {Show patient history (optional):23778::" "}   Medications: Outpatient Medications Prior to Visit  Medication Sig  . albuterol (VENTOLIN HFA) 108 (90 Base) MCG/ACT inhaler Inhale 2 puffs into the lungs every 6 (six) hours as  needed for wheezing or shortness of breath.  Jearl Klinefelter ELLIPTA 62.5-25 MCG/INH AEPB Inhale 1 puff into the lungs daily.  . clonazePAM (KLONOPIN) 0.5 MG tablet Take 1 tablet by mouth twice daily as needed  . clopidogrel (PLAVIX) 75 MG tablet Take 1 tablet (75 mg total) by mouth daily.  [name] ezetimibe (ZETIA) 10  MG tablet Take 1 tablet by mouth once daily  . gabapentin (NEURONTIN) 600 MG tablet Take one tablet up to 4 times a day  . glucose blood test strip Contour test strip. Use to check blood sugar once a day for for type 2 diabetes (E11.9)  . Lancets MISC Use to check blood sugar once daily for type 2 diabetes E11.9  . lisinopril (ZESTRIL) 10 MG tablet Take 10 mg by mouth daily.   . meloxicam (MOBIC) 15 MG tablet TAKE 1 TABLET BY MOUTH ONCE DAILY AS NEEDED FOR PAIN  . metFORMIN (GLUCOPHAGE-XR) 500 MG 24 hr tablet Take 1 tablet (500 mg total) by mouth 2 (two) times daily with a meal.  . metoprolol succinate (TOPROL-XL) 25 MG 24 hr tablet Take 1 tablet (25 mg total) by mouth daily.  . montelukast (SINGULAIR) 10 MG tablet Take 1 tablet (10 mg total) by mouth at bedtime.  [name] NARCAN 4 MG/0.1ML LIQD nasal spray kit Place 0.4 mg into the nose once.   . ondansetron (ZOFRAN ODT) 4 MG disintegrating tablet Take 1 tablet (4 mg total) by mouth every 8 (eight) hours as needed for nausea or vomiting.  [name] oxyCODONE-acetaminophen (PERCOCET) 10-325 MG tablet One tablet every 4-6 hours as needed.  . sertraline (ZOLOFT) 50 MG tablet Take 1 tablet (50 mg total) by mouth daily.  . sucralfate (CARAFATE) 1 g tablet TAKE 1 TABLET BY MOUTH 4 TIMES DAILY WITH MEALS AND AT BEDTIME  . sulfamethoxazole-trimethoprim (BACTRIM DS) 800-160 MG tablet Take 1 tablet by mouth 2 (two) times daily for 7 days.  . Tiotropium Bromide-Olodaterol 2.5-2.5 MCG/ACT AERS Inhale 2 puffs into the lungs daily.  . zaleplon (SONATA) 10 MG capsule Take 1 capsule (10 mg total) by mouth at bedtime. MAY TAKE SECOND CAPSULE AFTER 2 HOURS IF NEEDED   No  facility-administered medications prior to visit.    Review of Systems  {Heme  Chem  Endocrine  Serology  Results Review (optional):23779::" "}  Objective    There were no vitals taken for this visit. {Show previous vital signs (optional):23777::" "}  Physical Exam  ***  No results found for any visits on 12/22/19.  Assessment & Plan     ***  No follow-ups on file.      {provider attestation***:1}   Lelon Huh, MD  [facility] [phone] (phone) [phone] (fax)  [place]

## 2019-12-22 ENCOUNTER — Ambulatory Visit: Payer: Medicare HMO | Admitting: Family Medicine

## 2019-12-22 DIAGNOSIS — R809 Proteinuria, unspecified: Secondary | ICD-10-CM

## 2019-12-28 NOTE — Telephone Encounter (Signed)
   SF 12/28/2019   Name: Yvette Riley   MRN: [id number]   DOB: [birth year]   AGE: 64 y.o.   GENDER: female   PCP Birdie Sons, MD.    Called patient regarding referral for assistance with affordable dentures. Left message for patient to give Care Guide a call. Patient might be able to find assistance with dentures from [employer] in [place]. Per their website the starting price for replacement dentures is $395 and they also have payment options as well. Will try to give patient a call within the week to give her this information.   [place], Care Management Phone: [phone] Email: [email or web address]

## 2019-12-30 NOTE — Telephone Encounter (Signed)
° °  SF 12/30/2019    Name: Yvette Riley    MRN: [id number]    DOB: 13-Aug-1955    AGE: 64 y.o.    GENDER: female    PCP Birdie Sons, MD.   Called patient regarding referral for denture assistance. Left message for patient to give Care Guide a call. Also left patient information for [employer] [facility] for the [place] office.  Closing referral for unable to re-connect with patient.   Closing referral pending any other needs of patient.    [place], Care Management Phone: [phone] Email: [email or web address]

## 2020-01-06 ENCOUNTER — Other Ambulatory Visit: Payer: Self-pay | Admitting: Family Medicine

## 2020-01-06 DIAGNOSIS — G8929 Other chronic pain: Secondary | ICD-10-CM

## 2020-01-06 DIAGNOSIS — M5442 Lumbago with sciatica, left side: Secondary | ICD-10-CM

## 2020-01-06 NOTE — Telephone Encounter (Signed)
Please review for refill. Refill not delegated per protocol.  Last refill: 12/08/19 #180

## 2020-01-06 NOTE — Telephone Encounter (Signed)
Medication Refill - Medication: oxyCODONE-acetaminophen (PERCOCET) 10-325 MG tablet    Has the patient contacted their pharmacy? yes (Agent: If no, request that the patient contact the pharmacy for the refill.) (Agent: If yes, when and what did the pharmacy advise?)Contact PCP  Preferred Pharmacy (with phone number or street name):  [id number] [place], [place] Phone:  [phone]  Fax:  [phone]       Agent: Please be advised that RX refills may take up to 3 business days. We ask that you follow-up with your pharmacy.

## 2020-01-07 ENCOUNTER — Other Ambulatory Visit: Payer: Self-pay | Admitting: Family Medicine

## 2020-01-07 DIAGNOSIS — M5126 Other intervertebral disc displacement, lumbar region: Secondary | ICD-10-CM

## 2020-01-07 DIAGNOSIS — G8929 Other chronic pain: Secondary | ICD-10-CM

## 2020-01-07 DIAGNOSIS — M5442 Lumbago with sciatica, left side: Secondary | ICD-10-CM

## 2020-01-07 MED ORDER — GABAPENTIN 600 MG PO TABS: ORAL_TABLET | ORAL | 2 refills | Status: DC

## 2020-01-07 MED ORDER — OXYCODONE-ACETAMINOPHEN 10-325 MG PO TABS: ORAL_TABLET | ORAL | 0 refills | Status: DC

## 2020-01-07 NOTE — Telephone Encounter (Signed)
Patient requesting prescription to be sent to [facility] [address] [facility] in [place].

## 2020-01-07 NOTE — Telephone Encounter (Signed)
Patient reports that she called the [facility] after 1:07pm when the medication was sent. The medication was not received by the [facility]. Please advise Cb- [phone]

## 2020-01-07 NOTE — Telephone Encounter (Signed)
Medication: oxyCODONE-acetaminophen (PERCOCET) 10-325 MG tablet [[id number]] - The below medication was sent the wrong pharamacy. And it be sent to the below [facility]  Has the patient contacted their pharmacy? YES  (Agent: If no, request that the patient contact the pharmacy for the refill.) (Agent: If yes, when and what did the pharmacy advise?)  Preferred Pharmacy (with phone number or street name): [facility] ([address]), [place] - [place], [place] [address]  Phone:  [phone] Fax:  [phone]   Agent: Please be advised that RX refills may take up to 3 business days. We ask that you follow-up with your pharmacy.

## 2020-01-07 NOTE — Telephone Encounter (Signed)
[facility] [address] [place] to confirm if they received Rx. Pharmacy is getting it ready now[facility] Patient advised.

## 2020-01-07 NOTE — Telephone Encounter (Signed)
Medication: gabapentin (NEURONTIN) 600 MG tablet [[id number]] ,   Has the patient contacted their pharmacy? YES  (Agent: If no, request that the patient contact the pharmacy for the refill.) (Agent: If yes, when and what did the pharmacy advise?)  Preferred Pharmacy (with phone number or street name): [facility] ([facility]), [place] - [place], [place] [address]  Phone:  [phone] Fax:  [phone]   Agent: Please be advised that RX refills may take up to 3 business days. We ask that you follow-up with your pharmacy.

## 2020-01-07 NOTE — Telephone Encounter (Signed)
Resent to [facility] [address] [address]. Please call and cancel [facility] [place].

## 2020-01-10 ENCOUNTER — Other Ambulatory Visit: Payer: Self-pay

## 2020-01-10 ENCOUNTER — Telehealth (INDEPENDENT_AMBULATORY_CARE_PROVIDER_SITE_OTHER): Payer: Medicare HMO | Admitting: Family Medicine

## 2020-01-10 DIAGNOSIS — F32A Depression, unspecified: Secondary | ICD-10-CM | POA: Diagnosis not present

## 2020-01-10 DIAGNOSIS — R69 Illness, unspecified: Secondary | ICD-10-CM | POA: Diagnosis not present

## 2020-01-10 DIAGNOSIS — F419 Anxiety disorder, unspecified: Secondary | ICD-10-CM | POA: Diagnosis not present

## 2020-01-10 NOTE — Progress Notes (Signed)
Virtual telephone visit    Virtual Visit via Telephone Note   This visit type was conducted due to national recommendations for restrictions regarding the COVID-19 Pandemic (e.g. social distancing) in an effort to limit this patient's exposure and mitigate transmission in our community. Due to her co-morbid illnesses, this patient is at least at moderate risk for complications without adequate follow up. This format is felt to be most appropriate for this patient at this time. The patient did not have access to video technology or had technical difficulties with video requiring transitioning to audio format only (telephone). Physical exam was limited to content and character of the telephone converstion.    Patient location: home Provider location: [facility]  I discussed the limitations of evaluation and management by telemedicine and the availability of in person appointments. The patient expressed understanding and agreed to proceed.   Visit Date: 01/10/2020  Today's healthcare provider: Mila Merry, MD   Chief Complaint  Patient presents with  . Depression   Subjective    HPI  Depression, Follow-up  She  was last seen for this 4 months ago. Changes made at last visit include increased sertraline from 25mg  to 50mg  daily.   She reports good compliance with treatment. She is not having side effects.   She reports good tolerance of treatment. Current symptoms include: fatigue She feels she is Improved since last visit.  Depression screen [facility] 2/9 01/10/2020 12/08/2019 11/25/2018  Decreased Interest 0 0 0  Down, Depressed, Hopeless 0 0 0  PHQ - 2 Score 0 0 0  Altered sleeping 1 - -  Tired, decreased energy 1 - -  Change in appetite 0 - -  Feeling bad or failure about yourself  0 - -  Trouble concentrating 0 - -  Moving slowly or fidgety/restless 0 - -  Suicidal thoughts 0 - -  PHQ-9 Score 2 - -  Difficult doing work/chores Not difficult at all - -  Some recent data  might be hidden        Medications: Outpatient Medications Prior to Visit  Medication Sig  . albuterol (VENTOLIN HFA) 108 (90 Base) MCG/ACT inhaler Inhale 2 puffs into the lungs every 6 (six) hours as needed for wheezing or shortness of breath.  13/10/2019 ELLIPTA 62.5-25 MCG/INH AEPB Inhale 1 puff into the lungs daily.  . clonazePAM (KLONOPIN) 0.5 MG tablet Take 1 tablet by mouth twice daily as needed  . clopidogrel (PLAVIX) 75 MG tablet Take 1 tablet (75 mg total) by mouth daily.  Ailene Ards ezetimibe (ZETIA) 10 MG tablet Take 1 tablet by mouth once daily  . gabapentin (NEURONTIN) 600 MG tablet Take one tablet up to 4 times a day  . glucose blood test strip Contour test strip. Use to check blood sugar once a day for for type 2 diabetes (E11.9)  . Lancets MISC Use to check blood sugar once daily for type 2 diabetes E11.9  . lisinopril (ZESTRIL) 10 MG tablet Take 10 mg by mouth daily.   . meloxicam (MOBIC) 15 MG tablet TAKE 1 TABLET BY MOUTH ONCE DAILY AS NEEDED FOR PAIN  . metFORMIN (GLUCOPHAGE-XR) 500 MG 24 hr tablet Take 1 tablet (500 mg total) by mouth 2 (two) times daily with a meal.  . metoprolol succinate (TOPROL-XL) 25 MG 24 hr tablet Take 1 tablet (25 mg total) by mouth daily.  . montelukast (SINGULAIR) 10 MG tablet Take 1 tablet (10 mg total) by mouth at bedtime.  02-[birth year]1985 NARCAN 4 MG/0.1ML  LIQD nasal spray kit Place 0.4 mg into the nose once.   . ondansetron (ZOFRAN ODT) 4 MG disintegrating tablet Take 1 tablet (4 mg total) by mouth every 8 (eight) hours as needed for nausea or vomiting.  [name] oxyCODONE-acetaminophen (PERCOCET) 10-325 MG tablet One tablet every 4-6 hours as needed.  . sertraline (ZOLOFT) 50 MG tablet Take 1 tablet (50 mg total) by mouth daily.  . sucralfate (CARAFATE) 1 g tablet TAKE 1 TABLET BY MOUTH 4 TIMES DAILY WITH MEALS AND AT BEDTIME  . Tiotropium Bromide-Olodaterol 2.5-2.5 MCG/ACT AERS Inhale 2 puffs into the lungs daily.  . zaleplon (SONATA) 10 MG capsule Take 1 capsule (10  mg total) by mouth at bedtime. MAY TAKE SECOND CAPSULE AFTER 2 HOURS IF NEEDED   No facility-administered medications prior to visit.    Review of Systems  Constitutional: Positive for activity change and fatigue.  Respiratory: Positive for shortness of breath.        No change from baseline  Musculoskeletal: Positive for arthralgias and back pain.  Psychiatric/Behavioral: Negative for agitation, decreased concentration, self-injury and sleep disturbance. The patient is not nervous/anxious.       Objective    There were no vitals taken for this visit.   Awake, alert, oriented x 3. In no apparent distress   Assessment & Plan     1. Anxiety   2. Depression, unspecified depression type Doing well on current medication regiment. Will continue unchanged.    Follow up 3-4 months.      I discussed the assessment and treatment plan with the patient. The patient was provided an opportunity to ask questions and all were answered. The patient agreed with the plan and demonstrated an understanding of the instructions.   The patient was advised to call back or seek an in-person evaluation if the symptoms worsen or if the condition fails to improve as anticipated.  I provided 8 minutes of non-face-to-face time during this encounter.    Lelon Huh, MD [facility] [phone] (phone) [phone] (fax)  [place]

## 2020-01-12 ENCOUNTER — Telehealth: Payer: Self-pay | Admitting: *Deleted

## 2020-01-12 NOTE — Telephone Encounter (Signed)
Copied from [place] [id number]. Topic: General - Other >> Jan 12, 2020 11:30 AM [name] wrote: Reason for CRM: Patient called in to ask Dr Caryn Section to please complete a new form for a handicap placard so that she can pick it up in the office. Stated that she just got a new car and need this ASAP please. Can be reached at Ph#  [phone]

## 2020-01-13 ENCOUNTER — Other Ambulatory Visit: Payer: Self-pay | Admitting: Family Medicine

## 2020-01-13 NOTE — Telephone Encounter (Signed)
Please review. Thanks!  

## 2020-01-13 NOTE — Telephone Encounter (Signed)
Requested medication (s) are due for refill today: yes  Requested medication (s) are on the active medication list: yes  Last refill:  11/05/19 #30 with 3 refills  Future visit scheduled: yes  Notes to clinic:  Please review for refill. Not delegated per protocol    Requested Prescriptions  Pending Prescriptions Disp Refills   zaleplon (SONATA) 10 MG capsule [Pharmacy Med Name: Zaleplon 10 MG Oral Capsule] 30 capsule 0    Sig: TAKE 1 CAPSULE BY MOUTH AT BEDTIME. MAY TAKE SECOND CAPSULE AFTER 2 HOURS IF NEEDED      Not Delegated - Psychiatry:  Anxiolytics/Hypnotics Failed - 01/13/2020 10:11 AM      Failed - This refill cannot be delegated      Failed - Urine Drug Screen completed in last 360 days      Passed - Valid encounter within last 6 months    Recent Outpatient Visits           3 days ago    [facility] Birdie Sons, MD   4 months ago [place], Donald E, MD   7 months ago Essential hypertension   [facility] Birdie Sons, MD   7 months ago No-show for appointment   [facility] Birdie Sons, MD   10 months ago Type 2 diabetes mellitus with microalbuminuria, without long-term current use of insulin [facility])   [facility] Birdie Sons, MD       Future Appointments             In 3 months Fisher, Kirstie Peri, MD [facility] [place]

## 2020-01-13 NOTE — Telephone Encounter (Signed)
Patient called again to check on her form for the handicap placard. Please advise

## 2020-01-17 NOTE — Telephone Encounter (Signed)
Pt calling back for the status of her her handi cap  placard  [phone]

## 2020-01-19 NOTE — Telephone Encounter (Signed)
Patient called back wanting to get an update on when the application for handicap place card would be ready. Patient advised that Dr. Caryn Section is out of the office until 01/24/2020.

## 2020-01-20 ENCOUNTER — Other Ambulatory Visit: Payer: Self-pay | Admitting: Family Medicine

## 2020-01-20 DIAGNOSIS — F419 Anxiety disorder, unspecified: Secondary | ICD-10-CM

## 2020-01-20 MED ORDER — CLOPIDOGREL BISULFATE 75 MG PO TABS
75.0000 mg | ORAL_TABLET | Freq: Every day | ORAL | 3 refills | Status: DC
Start: 2020-01-20 — End: 2020-04-03

## 2020-01-20 NOTE — Telephone Encounter (Signed)
Requested medication (s) are due for refill today: yes  Requested medication (s) are on the active medication list:yes  Future visit scheduled: yes  Notes to clinic:   This refill cannot be delegated Patient also asking about  handicap placard that was suppose to be filled out And placed at front desk  Requested Prescriptions  Pending Prescriptions Disp Refills   clonazePAM (KLONOPIN) 0.5 MG tablet 60 tablet 2    Sig: Take 1 tablet (0.5 mg total) by mouth 2 (two) times daily as needed.      Not Delegated - Psychiatry:  Anxiolytics/Hypnotics Failed - 01/20/2020  2:09 PM      Failed - This refill cannot be delegated      Failed - Urine Drug Screen completed in last 360 days      Passed - Valid encounter within last 6 months    Recent Outpatient Visits           1 week ago    [place], Kirstie Peri, MD   4 months ago [place], Donald E, MD   7 months ago Essential hypertension   [facility] Birdie Sons, MD   7 months ago No-show for appointment   [facility] Birdie Sons, MD   11 months ago Type 2 diabetes mellitus with microalbuminuria, without long-term current use of insulin [facility])   [facility] Birdie Sons, MD       Future Appointments             In 3 months Fisher, Kirstie Peri, MD [facility]              Signed Prescriptions Disp Refills   clopidogrel (PLAVIX) 75 MG tablet 30 tablet 3    Sig: Take 1 tablet (75 mg total) by mouth daily.      Hematology: Antiplatelets - clopidogrel Failed - 01/20/2020  2:09 PM      Failed - Evaluate AST, ALT within 2 months of therapy initiation.      Passed - ALT in normal range and within 360 days    ALT  Date Value Ref Range Status  09/07/2019 13 0 - 32 IU/L Final          Passed - AST in normal range and within 360 days    AST  Date Value Ref Range Status  09/07/2019 20  0 - 40 IU/L Final          Passed - HCT in normal range and within 180 days    Hematocrit  Date Value Ref Range Status  09/07/2019 37.7 34.0 - 46.6 % Final          Passed - HGB in normal range and within 180 days    Hemoglobin  Date Value Ref Range Status  09/07/2019 12.2 11.1 - 15.9 g/dL Final          Passed - PLT in normal range and within 180 days    Platelets  Date Value Ref Range Status  09/07/2019 293 150 - 450 x10E3/uL Final          Passed - Valid encounter within last 6 months    Recent Outpatient Visits           1 week ago    [place], Kirstie Peri, MD   4 months ago [place], Donald E, MD  7 months ago Essential hypertension   [place], MD   7 months ago No-show for appointment   [facility] Birdie Sons, MD   11 months ago Type 2 diabetes mellitus with microalbuminuria, without long-term current use of insulin [facility])   [facility] Birdie Sons, MD       Future Appointments             In 3 months Fisher, Kirstie Peri, MD [facility] [place]

## 2020-01-20 NOTE — Telephone Encounter (Signed)
1.  Pt request refill  clopidogrel (PLAVIX) 75 MG tablet  clonazePAM (KLONOPIN) 0.5 MG tablet  [id number] - [place] ([address]), [place] states she is completely out of the [place] ([place]), [place] [address]    2.  Pt states she also needs the application for handi cap placard.  She states she asked for that 2 weeks ago.  Please put at front desk.

## 2020-01-23 MED ORDER — CLONAZEPAM 0.5 MG PO TABS: 0.5000 mg | ORAL_TABLET | Freq: Two times a day (BID) | ORAL | 2 refills | Status: DC | PRN

## 2020-01-23 NOTE — Progress Notes (Deleted)
[place]  [phone] [phone]  ID: Yvette Riley OB: 1955-11-03  MR#: [id number]  NIO#:[id number]  Patient Care Team: Birdie Sons, MD as PCP - General (Family Medicine) Lorelee Cover., MD (Ophthalmology) Vladimir Crofts, MD as Consulting Physician (Neurology) Dionisio David, MD as Consulting Physician (Cardiology) Lloyd Huger, MD as Consulting Physician (Oncology) Noreene Filbert, MD as Referring Physician (Radiation Oncology)  CHIEF COMPLAINT: Clinical stage IIIB small cell lung cancer of the right lung.  INTERVAL HISTORY: Patient returns to clinic today for further evaluation and discussion of her PET scan results. She continues to have residual neurologic complaints particularly with fine motor skills from her CVA this past year. She has chronic shortness of breath as well as weakness and fatigue that are unchanged.  She denies any recent fevers or illnesses.  She has a good appetite and denies weight loss.  She denies any chest pain, cough, or hemoptysis.  She denies any nausea, vomiting, constipation, or diarrhea.  She denies any melena or hematochezia.  She has no urinary complaints. Patient offers no further specific complaints today.  REVIEW OF SYSTEMS:   Review of Systems  Constitutional: Positive for malaise/fatigue. Negative for fever and weight loss.  Respiratory: Positive for shortness of breath. Negative for cough and hemoptysis.   Cardiovascular: Negative.  Negative for chest pain and leg swelling.  Gastrointestinal: Negative.  Negative for abdominal pain and constipation.  Genitourinary: Negative.  Negative for dysuria.  Musculoskeletal: Negative.  Negative for back pain and myalgias.  Skin: Negative.  Negative for rash.  Neurological: Positive for focal weakness and weakness. Negative for sensory change and headaches.  Psychiatric/Behavioral: Negative.  The patient is not nervous/anxious.     As per HPI.  Otherwise, a complete review of systems is negative.  PAST MEDICAL HISTORY: Past Medical History:  Diagnosis Date  . Allergy   . Anemia   . Anxiety   . Arthritis   . Asthma   . Blood transfusion without reported diagnosis   . C. difficile diarrhea 04/07/2016  . CAP (community acquired pneumonia) 03/21/2015  . Combined forms of age-related cataract of both eyes 05/06/2016  . COPD (chronic obstructive pulmonary disease) ([place])   . CVA (cerebral vascular accident) ([place]) 06/17/2018  . Diabetes mellitus without complication ([place])   . Displacement of lumbar intervertebral disc without myelopathy   . Emphysema of lung ([place])   . GERD (gastroesophageal reflux disease)   . Glaucoma   . History of chicken pox   . Hyperlipidemia   . Hypertension   . Pneumonia 03/29/2015  . Sepsis ([place]) 03/17/2016  . Shortness of breath   . Small cell lung cancer ([place])     PAST SURGICAL HISTORY: Past Surgical History:  Procedure Laterality Date  . CESAREAN SECTION    . TUBAL LIGATION    . UPPER GI ENDOSCOPY      FAMILY HISTORY: Family History  Problem Relation Age of Onset  . Heart failure Mother   . Heart disease Mother   . Stroke Mother   . Heart disease Brother   . COPD Brother   . Cancer Maternal Aunt        Breast Cancer    ADVANCED DIRECTIVES (Y/N):  N  HEALTH MAINTENANCE: Social History   Tobacco Use  . Smoking status: Former Smoker    Packs/day: 0.25    Years: 45.00    Pack years: 11.25    Types: Cigarettes    Quit date: 03/17/2016  Years since quitting: 3.8  . Smokeless tobacco: Never Used  . Tobacco comment: pt states she quit in 2016-2017  Vaping Use  . Vaping Use: Never used  Substance Use Topics  . Alcohol use: No    Alcohol/week: 0.0 standard drinks  . Drug use: No     Colonoscopy:  PAP:  Bone density:  Lipid panel:  Allergies  Allergen Reactions  . Codeine Anaphylaxis  . Other Anaphylaxis  . Penicillins Anaphylaxis, Rash and Other (See Comments)     Reaction:  Tongue swelling  Has patient had a PCN reaction causing immediate rash, facial/tongue/throat swelling, SOB or lightheadedness with hypotension:  Yes   Has patient had a PCN reaction causing severe rash involving mucus membranes or skin necrosis: No Has patient had a PCN reaction that required hospitalization No Has patient had a PCN reaction occurring within the last 10 years: No If all of the above answers are "NO", then may proceed with Cephalosporin use.  . [place] Venom [Bee Venom] Anaphylaxis  . Crestor [Rosuvastatin] Nausea And Vomiting    Corrected prior adverse reaction per [facility] [employer]. On 12/02/3011  patient reported N & V when she takes Crestor  . Gemfibrozil Rash, Nausea And Vomiting and Swelling  . Trazodone And Nefazodone Nausea And Vomiting  . Lipitor [Atorvastatin] Nausea Only    By patient report 12/02/2011. Had also been prescribed pravastatin and lovastatin by previous MD. Unclear if those caused same side effects.     Current Outpatient Medications  Medication Sig Dispense Refill  . albuterol (VENTOLIN HFA) 108 (90 Base) MCG/ACT inhaler Inhale 2 puffs into the lungs every 6 (six) hours as needed for wheezing or shortness of breath. 18 g 3  . ANORO ELLIPTA 62.5-25 MCG/INH AEPB Inhale 1 puff into the lungs daily.    . clonazePAM (KLONOPIN) 0.5 MG tablet Take 1 tablet by mouth twice daily as needed 60 tablet 2  . clopidogrel (PLAVIX) 75 MG tablet Take 1 tablet (75 mg total) by mouth daily. 30 tablet 3  . ezetimibe (ZETIA) 10 MG tablet Take 1 tablet by mouth once daily 90 tablet 4  . gabapentin (NEURONTIN) 600 MG tablet Take one tablet up to 4 times a day 360 tablet 2  . glucose blood test strip Contour test strip. Use to check blood sugar once a day for for type 2 diabetes (E11.9) 100 each 4  . Lancets MISC Use to check blood sugar once daily for type 2 diabetes E11.9 100 each 4  . lisinopril (ZESTRIL) 10 MG tablet Take 10 mg by mouth daily.     .  meloxicam (MOBIC) 15 MG tablet TAKE 1 TABLET BY MOUTH ONCE DAILY AS NEEDED FOR PAIN 90 tablet 3  . metFORMIN (GLUCOPHAGE-XR) 500 MG 24 hr tablet Take 1 tablet (500 mg total) by mouth 2 (two) times daily with a meal. 180 tablet 4  . metoprolol succinate (TOPROL-XL) 25 MG 24 hr tablet Take 1 tablet (25 mg total) by mouth daily. 90 tablet 2  . montelukast (SINGULAIR) 10 MG tablet Take 1 tablet (10 mg total) by mouth at bedtime. 90 tablet 3  . NARCAN 4 MG/0.1ML LIQD nasal spray kit Place 0.4 mg into the nose once.   99  . ondansetron (ZOFRAN ODT) 4 MG disintegrating tablet Take 1 tablet (4 mg total) by mouth every 8 (eight) hours as needed for nausea or vomiting. 20 tablet 0  . oxyCODONE-acetaminophen (PERCOCET) 10-325 MG tablet One tablet every 4-6 hours as needed.  180 tablet 0  . sertraline (ZOLOFT) 50 MG tablet Take 1 tablet (50 mg total) by mouth daily. 30 tablet 11  . sucralfate (CARAFATE) 1 g tablet TAKE 1 TABLET BY MOUTH 4 TIMES DAILY WITH MEALS AND AT BEDTIME 360 tablet 0  . Tiotropium Bromide-Olodaterol 2.5-2.5 MCG/ACT AERS Inhale 2 puffs into the lungs daily. 4 g 3  . zaleplon (SONATA) 10 MG capsule TAKE 1 CAPSULE BY MOUTH AT BEDTIME. MAY TAKE SECOND CAPSULE AFTER 2 HOURS IF NEEDED 30 capsule 0   No current facility-administered medications for this visit.    OBJECTIVE: There were no vitals filed for this visit.   There is no height or weight on file to calculate BMI.    ECOG FS:0 - Asymptomatic  General: Well-developed, well-nourished, no acute distress. Sitting in a wheelchair. Eyes: Pink conjunctiva, anicteric sclera. HEENT: Normocephalic, moist mucous membranes. Lungs: No audible wheezing or coughing. Heart: Regular rate and rhythm. Abdomen: Soft, nontender, no obvious distention. Musculoskeletal: No edema, cyanosis, or clubbing. Neuro: Alert, answering all questions appropriately. Cranial nerves grossly intact. Skin: No rashes or petechiae noted. Psych: Normal affect.  LAB  RESULTS:  Lab Results  Component Value Date   NA 141 09/07/2019   K 4.3 09/07/2019   CL 100 09/07/2019   CO2 21 09/07/2019   GLUCOSE 126 (H) 09/07/2019   BUN 12 09/07/2019   CREATININE 0.73 09/07/2019   CALCIUM 9.2 09/07/2019   PROT 7.4 09/07/2019   ALBUMIN 4.7 09/07/2019   AST 20 09/07/2019   ALT 13 09/07/2019   ALKPHOS 90 09/07/2019   BILITOT 0.4 09/07/2019   GFRNONAA 89 09/07/2019   GFRAA 102 09/07/2019    Lab Results  Component Value Date   WBC 6.9 09/07/2019   NEUTROABS 7.2 06/17/2018   HGB 12.2 09/07/2019   HCT 37.7 09/07/2019   MCV 95 09/07/2019   PLT 293 09/07/2019     STUDIES: No results found.  ASSESSMENT: Clinical stage IIIB small cell lung cancer of the right lung.  PLAN:    1. Clinical stage IIIB small cell lung cancer of the right lung: Patient completed concurrent chemotherapy and XRT at outside facility and then transferred care to [facility] to complete treatment and long-term monitoring for recurrence. She completed cycle 6 of carboplatinum and etoposide on August 15, 2016.  She also completed PCI. Her most recent CT scan of her chest on September 10, 2018 revealed 2 new small lower lobe pulmonary nodules which were nonspecific the largest in the right lower lobe measuring 5 mm. Follow-up PET scan on January 27, 2019 revealed no FDG avid mass or adenopathy suggestive of residual or recurrent disease. No intervention is needed at this time. Patient is greater than 2-1/2 years out from completing her treatment and can return to clinic in 1 year with repeat imaging and further evaluation. 2.  Cough/shortness of breath: Chronic and unchanged. Continue follow-up with pulmonology and cardiology as scheduled. 3.  Back pain: Chronic and unchanged. Patient does not complain of this today.    Patient expressed understanding and was in agreement with this plan. She also understands that She can call clinic at any time with any questions, concerns, or complaints.   Cancer  Staging Small cell lung cancer, right [facility]) Staging form: Lung, AJCC 8th Edition - Clinical stage from 08/18/2016: Stage IIIB (cT2a, cN3, cM0) - Signed by Lloyd Huger, MD on 08/18/2016   Lloyd Huger, MD   01/23/2020 9:08 AM

## 2020-01-24 ENCOUNTER — Telehealth: Payer: Self-pay | Admitting: Oncology

## 2020-01-24 ENCOUNTER — Ambulatory Visit: Admission: RE | Admit: 2020-01-24 | Payer: Medicare HMO | Source: Ambulatory Visit

## 2020-01-24 NOTE — Telephone Encounter (Signed)
done

## 2020-01-24 NOTE — Telephone Encounter (Signed)
Pt moved 12/27 CT scan to 1/10. Called pt to move her follow-up appt with Dr. Grayland Ormond for results to after the scan was completed. Ask pt to return the call.

## 2020-01-25 ENCOUNTER — Inpatient Hospital Stay: Payer: Medicare HMO | Admitting: Oncology

## 2020-01-25 ENCOUNTER — Telehealth: Payer: Self-pay | Admitting: Oncology

## 2020-01-25 NOTE — Telephone Encounter (Signed)
2nd attempt to reach pt this am. No answer. Message left to contact office to r\s 12/28 appt until after CT scan is completed.

## 2020-02-02 ENCOUNTER — Telehealth: Payer: Self-pay

## 2020-02-02 ENCOUNTER — Other Ambulatory Visit: Payer: Self-pay | Admitting: Family Medicine

## 2020-02-02 DIAGNOSIS — G8929 Other chronic pain: Secondary | ICD-10-CM

## 2020-02-02 NOTE — Telephone Encounter (Signed)
Requested medication (s) are due for refill today: yes  Requested medication (s) are on the active medication list: yes  Last refill:  01/07/2020  Future visit scheduled: yes  Notes to clinic: this refill cannot be delegated    Requested Prescriptions  Pending Prescriptions Disp Refills   oxyCODONE-acetaminophen (PERCOCET) 10-325 MG tablet 180 tablet 0    Sig: One tablet every 4-6 hours as needed.      Not Delegated - Analgesics:  Opioid Agonist Combinations Failed - 02/02/2020 10:38 AM      Failed - This refill cannot be delegated      Failed - Urine Drug Screen completed in last 360 days      Passed - Valid encounter within last 6 months    Recent Outpatient Visits           3 weeks ago [place], Donald E, MD   4 months ago [place], Donald E, MD   8 months ago Essential hypertension   [facility] Birdie Sons, MD   8 months ago No-show for appointment   [facility] Birdie Sons, MD   11 months ago Type 2 diabetes mellitus with microalbuminuria, without long-term current use of insulin [facility])   [facility] Birdie Sons, MD       Future Appointments             In 3 months Fisher, Kirstie Peri, MD [facility] [place]

## 2020-02-02 NOTE — Telephone Encounter (Signed)
Copied from [place] [phone]. Topic: General - Other >> Feb 02, 2020 10:25 AM Keene Breath wrote: Reason for CRM: Patient called to ask the nurse or doctor to call her because she has some questions.  Would not elaborate and would like a call back at [phone]

## 2020-02-02 NOTE — Telephone Encounter (Signed)
Medication Refill - Medication: clopidogrel (PLAVIX) 75 MG tablet oxyCODONE-acetaminophen (PERCOCET) 10-325 MG tablet     Preferred Pharmacy (with phone number or street name):  [facility] [place] [facility] [place] Phone:  [phone]  Fax:  [phone]       Agent: Please be advised that RX refills may take up to 3 business days. We ask that you follow-up with your pharmacy.

## 2020-02-03 ENCOUNTER — Telehealth: Payer: Self-pay

## 2020-02-03 NOTE — Telephone Encounter (Signed)
Copied from [place] [phone]. Topic: Quick Communication - Rx Refill/Question >> Feb 03, 2020 10:55 AM Gillis Ends D wrote: Medication: Oxycodone 10/325  Has the patient contacted their pharmacy? No. (Agent: If no, request that the patient contact the pharmacy for the refill.) (Agent: If yes, when and what did the pharmacy advise?)  Preferred Pharmacy (with phone number or street name): [facility] [place]: Please be advised that RX refills may take up to 3 business days. We ask that you follow-up with your pharmacy.

## 2020-02-03 NOTE — Telephone Encounter (Signed)
Tried calling patient. Left message to call back. OK for [facility] triage to speak with patient to collect more detail and find out what her questions were.

## 2020-02-03 NOTE — Telephone Encounter (Signed)
Copied from [place] [phone]. Topic: General - Inquiry >> Feb 03, 2020 10:56 AM Gillis Ends D wrote: Reason for CRM: Patient would like a copy of her handicap sticker paperwork. She said she lost the other one. She asks that you leave it at the front desk and she will pick it up. Please advise

## 2020-02-04 MED ORDER — OXYCODONE-ACETAMINOPHEN 10-325 MG PO TABS: ORAL_TABLET | ORAL | 0 refills | Status: DC

## 2020-02-04 NOTE — Telephone Encounter (Signed)
Ok to fill out another one for the patient?

## 2020-02-07 ENCOUNTER — Ambulatory Visit: Payer: Medicare HMO

## 2020-02-07 NOTE — Telephone Encounter (Signed)
done

## 2020-02-07 NOTE — Telephone Encounter (Signed)
Patient called back to inquire if the Handicapped Placard form that she requested last Thursday. Asking for a call please when it is ready need it ASAP please Ph# [phone]

## 2020-02-10 ENCOUNTER — Telehealth: Payer: Self-pay | Admitting: Oncology

## 2020-02-10 NOTE — Telephone Encounter (Signed)
02/10/2020 Returned pts call from 02/09/20.  Wanted details about upcoming scans. Left VM with date, time, and NPO instructions for chest CT. Left call back # in case they have any questions SRW

## 2020-02-17 ENCOUNTER — Ambulatory Visit: Admission: RE | Admit: 2020-02-17 | Payer: Medicare HMO | Source: Ambulatory Visit

## 2020-02-19 NOTE — Progress Notes (Deleted)
[place]  [phone] [phone]  ID: TAFFIE ECKMANN OB: 1955/07/29  MR#: [id number]  GBT#:[id number]  Patient Care Team: Birdie Sons, MD as PCP - General (Family Medicine) Lorelee Cover., MD (Ophthalmology) Vladimir Crofts, MD as Consulting Physician (Neurology) Dionisio David, MD as Consulting Physician (Cardiology) Lloyd Huger, MD as Consulting Physician (Oncology) Noreene Filbert, MD as Referring Physician (Radiation Oncology)  CHIEF COMPLAINT: Clinical stage IIIB small cell lung cancer of the right lung.  INTERVAL HISTORY: Patient returns to clinic today for further evaluation and discussion of her PET scan results. She continues to have residual neurologic complaints particularly with fine motor skills from her CVA this past year. She has chronic shortness of breath as well as weakness and fatigue that are unchanged.  She denies any recent fevers or illnesses.  She has a good appetite and denies weight loss.  She denies any chest pain, cough, or hemoptysis.  She denies any nausea, vomiting, constipation, or diarrhea.  She denies any melena or hematochezia.  She has no urinary complaints. Patient offers no further specific complaints today.  REVIEW OF SYSTEMS:   Review of Systems  Constitutional: Positive for malaise/fatigue. Negative for fever and weight loss.  Respiratory: Positive for shortness of breath. Negative for cough and hemoptysis.   Cardiovascular: Negative.  Negative for chest pain and leg swelling.  Gastrointestinal: Negative.  Negative for abdominal pain and constipation.  Genitourinary: Negative.  Negative for dysuria.  Musculoskeletal: Negative.  Negative for back pain and myalgias.  Skin: Negative.  Negative for rash.  Neurological: Positive for focal weakness and weakness. Negative for sensory change and headaches.  Psychiatric/Behavioral: Negative.  The patient is not nervous/anxious.     As per HPI.  Otherwise, a complete review of systems is negative.  PAST MEDICAL HISTORY: Past Medical History:  Diagnosis Date  . Allergy   . Anemia   . Anxiety   . Arthritis   . Asthma   . Blood transfusion without reported diagnosis   . C. difficile diarrhea 04/07/2016  . CAP (community acquired pneumonia) 03/21/2015  . Combined forms of age-related cataract of both eyes 05/06/2016  . COPD (chronic obstructive pulmonary disease) ([place])   . CVA (cerebral vascular accident) ([place]) 06/17/2018  . Diabetes mellitus without complication ([place])   . Displacement of lumbar intervertebral disc without myelopathy   . Emphysema of lung ([place])   . GERD (gastroesophageal reflux disease)   . Glaucoma   . History of chicken pox   . Hyperlipidemia   . Hypertension   . Pneumonia 03/29/2015  . Sepsis ([place]) 03/17/2016  . Shortness of breath   . Small cell lung cancer ([place])     PAST SURGICAL HISTORY: Past Surgical History:  Procedure Laterality Date  . CESAREAN SECTION    . TUBAL LIGATION    . UPPER GI ENDOSCOPY      FAMILY HISTORY: Family History  Problem Relation Age of Onset  . Heart failure Mother   . Heart disease Mother   . Stroke Mother   . Heart disease Brother   . COPD Brother   . Cancer Maternal Aunt        Breast Cancer    ADVANCED DIRECTIVES (Y/N):  N  HEALTH MAINTENANCE: Social History   Tobacco Use  . Smoking status: Former Smoker    Packs/day: 0.25    Years: 45.00    Pack years: 11.25    Types: Cigarettes    Quit date: 03/17/2016  Years since quitting: 3.9  . Smokeless tobacco: Never Used  . Tobacco comment: pt states she quit in 2016-2017  Vaping Use  . Vaping Use: Never used  Substance Use Topics  . Alcohol use: No    Alcohol/week: 0.0 standard drinks  . Drug use: No     Colonoscopy:  PAP:  Bone density:  Lipid panel:  Allergies  Allergen Reactions  . Codeine Anaphylaxis  . Other Anaphylaxis  . Penicillins Anaphylaxis, Rash and Other (See Comments)     Reaction:  Tongue swelling  Has patient had a PCN reaction causing immediate rash, facial/tongue/throat swelling, SOB or lightheadedness with hypotension:  Yes   Has patient had a PCN reaction causing severe rash involving mucus membranes or skin necrosis: No Has patient had a PCN reaction that required hospitalization No Has patient had a PCN reaction occurring within the last 10 years: No If all of the above answers are "NO", then may proceed with Cephalosporin use.  . [place] Venom [Bee Venom] Anaphylaxis  . Crestor [Rosuvastatin] Nausea And Vomiting    Corrected prior adverse reaction per [facility] [employer]. On 12/02/3011  patient reported N & V when she takes Crestor  . Gemfibrozil Rash, Nausea And Vomiting and Swelling  . Trazodone And Nefazodone Nausea And Vomiting  . Lipitor [Atorvastatin] Nausea Only    By patient report 12/02/2011. Had also been prescribed pravastatin and lovastatin by previous MD. Unclear if those caused same side effects.     Current Outpatient Medications  Medication Sig Dispense Refill  . albuterol (VENTOLIN HFA) 108 (90 Base) MCG/ACT inhaler Inhale 2 puffs into the lungs every 6 (six) hours as needed for wheezing or shortness of breath. 18 g 3  . ANORO ELLIPTA 62.5-25 MCG/INH AEPB Inhale 1 puff into the lungs daily.    . clonazePAM (KLONOPIN) 0.5 MG tablet Take 1 tablet (0.5 mg total) by mouth 2 (two) times daily as needed. 60 tablet 2  . clopidogrel (PLAVIX) 75 MG tablet Take 1 tablet (75 mg total) by mouth daily. 30 tablet 3  . ezetimibe (ZETIA) 10 MG tablet Take 1 tablet by mouth once daily 90 tablet 4  . gabapentin (NEURONTIN) 600 MG tablet Take one tablet up to 4 times a day 360 tablet 2  . glucose blood test strip Contour test strip. Use to check blood sugar once a day for for type 2 diabetes (E11.9) 100 each 4  . Lancets MISC Use to check blood sugar once daily for type 2 diabetes E11.9 100 each 4  . lisinopril (ZESTRIL) 10 MG tablet Take 10 mg by  mouth daily.     . meloxicam (MOBIC) 15 MG tablet TAKE 1 TABLET BY MOUTH ONCE DAILY AS NEEDED FOR PAIN 90 tablet 3  . metFORMIN (GLUCOPHAGE-XR) 500 MG 24 hr tablet Take 1 tablet (500 mg total) by mouth 2 (two) times daily with a meal. 180 tablet 4  . metoprolol succinate (TOPROL-XL) 25 MG 24 hr tablet Take 1 tablet (25 mg total) by mouth daily. 90 tablet 2  . montelukast (SINGULAIR) 10 MG tablet Take 1 tablet (10 mg total) by mouth at bedtime. 90 tablet 3  . NARCAN 4 MG/0.1ML LIQD nasal spray kit Place 0.4 mg into the nose once.   99  . ondansetron (ZOFRAN ODT) 4 MG disintegrating tablet Take 1 tablet (4 mg total) by mouth every 8 (eight) hours as needed for nausea or vomiting. 20 tablet 0  . oxyCODONE-acetaminophen (PERCOCET) 10-325 MG tablet One tablet  every 4-6 hours as needed. 180 tablet 0  . sertraline (ZOLOFT) 50 MG tablet Take 1 tablet (50 mg total) by mouth daily. 30 tablet 11  . sucralfate (CARAFATE) 1 g tablet TAKE 1 TABLET BY MOUTH 4 TIMES DAILY WITH MEALS AND AT BEDTIME 360 tablet 0  . Tiotropium Bromide-Olodaterol 2.5-2.5 MCG/ACT AERS Inhale 2 puffs into the lungs daily. 4 g 3  . zaleplon (SONATA) 10 MG capsule TAKE 1 CAPSULE BY MOUTH AT BEDTIME. MAY TAKE SECOND CAPSULE AFTER 2 HOURS IF NEEDED 30 capsule 0   No current facility-administered medications for this visit.    OBJECTIVE: There were no vitals filed for this visit.   There is no height or weight on file to calculate BMI.    ECOG FS:0 - Asymptomatic  General: Well-developed, well-nourished, no acute distress. Sitting in a wheelchair. Eyes: Pink conjunctiva, anicteric sclera. HEENT: Normocephalic, moist mucous membranes. Lungs: No audible wheezing or coughing. Heart: Regular rate and rhythm. Abdomen: Soft, nontender, no obvious distention. Musculoskeletal: No edema, cyanosis, or clubbing. Neuro: Alert, answering all questions appropriately. Cranial nerves grossly intact. Skin: No rashes or petechiae noted. Psych:  Normal affect.  LAB RESULTS:  Lab Results  Component Value Date   NA 141 09/07/2019   K 4.3 09/07/2019   CL 100 09/07/2019   CO2 21 09/07/2019   GLUCOSE 126 (H) 09/07/2019   BUN 12 09/07/2019   CREATININE 0.73 09/07/2019   CALCIUM 9.2 09/07/2019   PROT 7.4 09/07/2019   ALBUMIN 4.7 09/07/2019   AST 20 09/07/2019   ALT 13 09/07/2019   ALKPHOS 90 09/07/2019   BILITOT 0.4 09/07/2019   GFRNONAA 89 09/07/2019   GFRAA 102 09/07/2019    Lab Results  Component Value Date   WBC 6.9 09/07/2019   NEUTROABS 7.2 06/17/2018   HGB 12.2 09/07/2019   HCT 37.7 09/07/2019   MCV 95 09/07/2019   PLT 293 09/07/2019     STUDIES: No results found.  ASSESSMENT: Clinical stage IIIB small cell lung cancer of the right lung.  PLAN:    1. Clinical stage IIIB small cell lung cancer of the right lung: Patient completed concurrent chemotherapy and XRT at outside facility and then transferred care to [facility] to complete treatment and long-term monitoring for recurrence. She completed cycle 6 of carboplatinum and etoposide on August 15, 2016.  She also completed PCI. Her most recent CT scan of her chest on September 10, 2018 revealed 2 new small lower lobe pulmonary nodules which were nonspecific the largest in the right lower lobe measuring 5 mm. Follow-up PET scan on January 27, 2019 revealed no FDG avid mass or adenopathy suggestive of residual or recurrent disease. No intervention is needed at this time. Patient is greater than 2-1/2 years out from completing her treatment and can return to clinic in 1 year with repeat imaging and further evaluation. 2.  Cough/shortness of breath: Chronic and unchanged. Continue follow-up with pulmonology and cardiology as scheduled. 3.  Back pain: Chronic and unchanged. Patient does not complain of this today.    Patient expressed understanding and was in agreement with this plan. She also understands that She can call clinic at any time with any questions, concerns, or  complaints.   Cancer Staging Small cell lung cancer, right [facility]) Staging form: Lung, AJCC 8th Edition - Clinical stage from 08/18/2016: Stage IIIB (cT2a, cN3, cM0) - Signed by Lloyd Huger, MD on 08/18/2016   Lloyd Huger, MD   02/19/2020 2:18 PM

## 2020-02-22 ENCOUNTER — Other Ambulatory Visit: Payer: Self-pay | Admitting: Family Medicine

## 2020-02-22 ENCOUNTER — Telehealth: Payer: Self-pay | Admitting: Oncology

## 2020-02-22 DIAGNOSIS — R053 Chronic cough: Secondary | ICD-10-CM

## 2020-02-22 NOTE — Telephone Encounter (Signed)
Requested medication (s) are due for refill today: expired medication  Requested medication (s) are on the active medication list: no   Last refill:  na  Future visit scheduled: yes in 2 months   Notes to clinic:  expired medication.     Requested Prescriptions  Pending Prescriptions Disp Refills   benzonatate (TESSALON) 100 MG capsule [Pharmacy Med Name: Benzonatate 100 MG Oral Capsule] 60 capsule 0    Sig: Take 1 capsule by mouth three times daily as needed for cough      Ear, Nose, and Throat:  Antitussives/Expectorants Passed - 02/22/2020 12:31 PM      Passed - Valid encounter within last 12 months    Recent Outpatient Visits           1 month ago [place], Donald E, MD   5 months ago [place], Donald E, MD   8 months ago Essential hypertension   [facility] Birdie Sons, MD   9 months ago No-show for appointment   [facility] Birdie Sons, MD   12 months ago Type 2 diabetes mellitus with microalbuminuria, without long-term current use of insulin [facility])   [facility] Birdie Sons, MD       Future Appointments             In 2 months Fisher, Kirstie Peri, MD [facility], [place]

## 2020-02-22 NOTE — Telephone Encounter (Signed)
Pt has missed scheduled CT scan 3 times. Per Dr. Grayland Ormond pt will need to call to reschedule all appts prior to MD visit. Pt provided information to contact Centralized Scheduling and [place] once CT scan has been rescheduled.

## 2020-02-24 ENCOUNTER — Ambulatory Visit: Payer: Medicare HMO | Admitting: Oncology

## 2020-02-24 DIAGNOSIS — C3491 Malignant neoplasm of unspecified part of right bronchus or lung: Secondary | ICD-10-CM

## 2020-02-28 ENCOUNTER — Telehealth: Payer: Self-pay | Admitting: *Deleted

## 2020-02-28 NOTE — Telephone Encounter (Signed)
Patient called asking when her appointment is to be with Dr Grayland Ormond. Her previous appointment was cancelled due to her cancelling her CT. Her Ct has been rescheduled for 03/06/20, but her follow up appointment has not been scheduled. Please reschedule her appointment and call her with date and time.

## 2020-03-06 ENCOUNTER — Telehealth: Payer: Self-pay | Admitting: Oncology

## 2020-03-06 ENCOUNTER — Ambulatory Visit
Admission: RE | Admit: 2020-03-06 | Discharge: 2020-03-06 | Disposition: A | Discharge status: Home / Self Care | Payer: Medicare HMO | Source: Ambulatory Visit | Attending: Oncology | Admitting: Oncology

## 2020-03-06 ENCOUNTER — Other Ambulatory Visit: Payer: Self-pay

## 2020-03-06 DIAGNOSIS — C3491 Malignant neoplasm of unspecified part of right bronchus or lung: Secondary | ICD-10-CM | POA: Diagnosis not present

## 2020-03-06 DIAGNOSIS — I700 Atherosclerosis of aorta: Secondary | ICD-10-CM | POA: Diagnosis not present

## 2020-03-06 DIAGNOSIS — J439 Emphysema, unspecified: Secondary | ICD-10-CM | POA: Diagnosis not present

## 2020-03-06 DIAGNOSIS — I2510 Atherosclerotic heart disease of native coronary artery without angina pectoris: Secondary | ICD-10-CM | POA: Diagnosis not present

## 2020-03-06 DIAGNOSIS — C3490 Malignant neoplasm of unspecified part of unspecified bronchus or lung: Secondary | ICD-10-CM | POA: Diagnosis not present

## 2020-03-06 LAB — POCT I-STAT CREATININE: Creatinine, Ser: 0.7 mg/dL (ref 0.44–1.00)

## 2020-03-06 MED ORDER — IOHEXOL 300 MG/ML  SOLN
75.0000 mL | Freq: Once | INTRAMUSCULAR | Status: AC | PRN
  Administered 2020-03-06: 75 mL via INTRAVENOUS

## 2020-03-06 NOTE — Telephone Encounter (Signed)
Returned phone call to patient to confirm her CT appt today 2/7 at 4pm with arrival time at 3:45.

## 2020-03-09 ENCOUNTER — Other Ambulatory Visit: Payer: Self-pay | Admitting: Family Medicine

## 2020-03-09 DIAGNOSIS — M5126 Other intervertebral disc displacement, lumbar region: Secondary | ICD-10-CM

## 2020-03-09 DIAGNOSIS — M5442 Lumbago with sciatica, left side: Secondary | ICD-10-CM

## 2020-03-09 DIAGNOSIS — G8929 Other chronic pain: Secondary | ICD-10-CM

## 2020-03-09 MED ORDER — GABAPENTIN 600 MG PO TABS: ORAL_TABLET | ORAL | 1 refills | Status: DC

## 2020-03-09 NOTE — Telephone Encounter (Signed)
Requested medication (s) are due for refill today: yes  Requested medication (s) are on the active medication list: yes  Last refill:  02/04/2020  Future visit scheduled:  yes  Notes to clinic:  this refill cannot be delegated    Requested Prescriptions  Pending Prescriptions Disp Refills   oxyCODONE-acetaminophen (PERCOCET) 10-325 MG tablet 180 tablet 0    Sig: One tablet every 4-6 hours as needed.      Not Delegated - Analgesics:  Opioid Agonist Combinations Failed - 03/09/2020  3:05 PM      Failed - This refill cannot be delegated      Failed - Urine Drug Screen completed in last 360 days      Passed - Valid encounter within last 6 months    Recent Outpatient Visits           1 month ago [place], Donald E, MD   6 months ago [place], Donald E, MD   9 months ago Essential hypertension   [facility] Birdie Sons, MD   9 months ago No-show for appointment   [facility] Birdie Sons, MD   1 year ago Type 2 diabetes mellitus with microalbuminuria, without long-term current use of insulin [facility])   [facility] Birdie Sons, MD       Future Appointments             In 2 months Fisher, Kirstie Peri, MD [facility], [facility]              Signed Prescriptions Disp Refills   gabapentin (NEURONTIN) 600 MG tablet 360 tablet 1    Sig: Take one tablet up to 4 times a day      Neurology: Anticonvulsants - gabapentin Passed - 03/09/2020  3:05 PM      Passed - Valid encounter within last 12 months    Recent Outpatient Visits           1 month ago [place], Donald E, MD   6 months ago [place], Donald E, MD   9 months ago Essential hypertension   [facility] Birdie Sons, MD   9 months ago No-show for appointment   [facility] Birdie Sons, MD   1 year ago Type 2 diabetes mellitus with microalbuminuria, without long-term current use of insulin [facility])   [facility] Birdie Sons, MD       Future Appointments             In 2 months Fisher, Kirstie Peri, MD [facility], [place]

## 2020-03-09 NOTE — Telephone Encounter (Signed)
Copied from [place] [phone]. Topic: Quick Communication - Rx Refill/Question >> Mar 09, 2020  2:59 PM Mcneil, Ja-Kwan wrote: Medication: gabapentin (NEURONTIN) 600 MG tablet and oxyCODONE-acetaminophen (PERCOCET) 10-325 MG tablet  Has the patient contacted their pharmacy? No - Pt stated Dr Caryn Section to her to call office to refill Rx  Preferred Pharmacy (with phone number or street name): [facility] [place]: Please be advised that RX refills may take up to 3 business days. We ask that you follow-up with your pharmacy.

## 2020-03-10 MED ORDER — OXYCODONE-ACETAMINOPHEN 10-325 MG PO TABS: ORAL_TABLET | ORAL | 0 refills | Status: DC

## 2020-03-19 NOTE — Progress Notes (Signed)
[place]  [phone] [phone]  ID: RIO TABER OB: 03/16/1955  MR#: [id number]  WEX#:[id number]  Patient Care Team: Birdie Sons, MD as PCP - General (Family Medicine) Lorelee Cover., MD (Ophthalmology) Vladimir Crofts, MD as Consulting Physician (Neurology) Dionisio David, MD as Consulting Physician (Cardiology) Lloyd Huger, MD as Consulting Physician (Oncology) Noreene Filbert, MD as Referring Physician (Radiation Oncology)  CHIEF COMPLAINT: Clinical stage IIIB small cell lung cancer of the right lung.  INTERVAL HISTORY: Patient returns to clinic today for routine yearly evaluation and discussion of her imaging results.  She continues to have residual neurologic effects from a CVA several years ago, but otherwise feels well.  She continues to have chronic shortness of breath and weakness and fatigue.  She denies any recent fevers or illnesses.  She has a good appetite and denies weight loss.  She denies any chest pain, cough, or hemoptysis.  She denies any nausea, vomiting, constipation, or diarrhea.  She denies any melena or hematochezia.  She has no urinary complaints.  Patient offers no further specific complaints today.  REVIEW OF SYSTEMS:   Review of Systems  Constitutional: Positive for malaise/fatigue. Negative for fever and weight loss.  Respiratory: Positive for shortness of breath. Negative for cough and hemoptysis.   Cardiovascular: Negative.  Negative for chest pain and leg swelling.  Gastrointestinal: Negative.  Negative for abdominal pain and constipation.  Genitourinary: Negative.  Negative for dysuria.  Musculoskeletal: Negative.  Negative for back pain and myalgias.  Skin: Negative.  Negative for rash.  Neurological: Positive for focal weakness and weakness. Negative for sensory change and headaches.  Psychiatric/Behavioral: Negative.  The patient is not nervous/anxious.     As per HPI. Otherwise, a  complete review of systems is negative.  PAST MEDICAL HISTORY: Past Medical History:  Diagnosis Date  . Allergy   . Anemia   . Anxiety   . Arthritis   . Asthma   . Blood transfusion without reported diagnosis   . C. difficile diarrhea 04/07/2016  . CAP (community acquired pneumonia) 03/21/2015  . Combined forms of age-related cataract of both eyes 05/06/2016  . COPD (chronic obstructive pulmonary disease) ([place])   . CVA (cerebral vascular accident) ([place]) 06/17/2018  . Diabetes mellitus without complication ([place])   . Displacement of lumbar intervertebral disc without myelopathy   . Emphysema of lung ([place])   . GERD (gastroesophageal reflux disease)   . Glaucoma   . History of chicken pox   . Hyperlipidemia   . Hypertension   . Pneumonia 03/29/2015  . Sepsis ([place]) 03/17/2016  . Shortness of breath   . Small cell lung cancer ([place])     PAST SURGICAL HISTORY: Past Surgical History:  Procedure Laterality Date  . CESAREAN SECTION    . TUBAL LIGATION    . UPPER GI ENDOSCOPY      FAMILY HISTORY: Family History  Problem Relation Age of Onset  . Heart failure Mother   . Heart disease Mother   . Stroke Mother   . Heart disease Brother   . COPD Brother   . Cancer Maternal Aunt        Breast Cancer    ADVANCED DIRECTIVES (Y/N):  N  HEALTH MAINTENANCE: Social History   Tobacco Use  . Smoking status: Former Smoker    Packs/day: 0.25    Years: 45.00    Pack years: 11.25    Types: Cigarettes    Quit date: 03/17/2016  Years since quitting: 4.0  . Smokeless tobacco: Never Used  . Tobacco comment: pt states she quit in 2016-2017  Vaping Use  . Vaping Use: Never used  Substance Use Topics  . Alcohol use: No    Alcohol/week: 0.0 standard drinks  . Drug use: No     Colonoscopy:  PAP:  Bone density:  Lipid panel:  Allergies  Allergen Reactions  . Codeine Anaphylaxis  . Other Anaphylaxis  . Penicillins Anaphylaxis, Rash and Other (See Comments)    Reaction:  Tongue  swelling  Has patient had a PCN reaction causing immediate rash, facial/tongue/throat swelling, SOB or lightheadedness with hypotension:  Yes   Has patient had a PCN reaction causing severe rash involving mucus membranes or skin necrosis: No Has patient had a PCN reaction that required hospitalization No Has patient had a PCN reaction occurring within the last 10 years: No If all of the above answers are "NO", then may proceed with Cephalosporin use.  . [place] Venom [Bee Venom] Anaphylaxis  . Crestor [Rosuvastatin] Nausea And Vomiting    Corrected prior adverse reaction per [facility] [employer]. On 12/02/3011  patient reported N & V when she takes Crestor  . Gemfibrozil Rash, Nausea And Vomiting and Swelling  . Trazodone And Nefazodone Nausea And Vomiting  . Lipitor [Atorvastatin] Nausea Only    By patient report 12/02/2011. Had also been prescribed pravastatin and lovastatin by previous MD. Unclear if those caused same side effects.     Current Outpatient Medications  Medication Sig Dispense Refill  . clonazePAM (KLONOPIN) 0.5 MG tablet Take 1 tablet (0.5 mg total) by mouth 2 (two) times daily as needed. 60 tablet 2  . clopidogrel (PLAVIX) 75 MG tablet Take 1 tablet (75 mg total) by mouth daily. 30 tablet 3  . ezetimibe (ZETIA) 10 MG tablet Take 1 tablet by mouth once daily 90 tablet 4  . gabapentin (NEURONTIN) 600 MG tablet Take one tablet up to 4 times a day 360 tablet 1  . glucose blood test strip Contour test strip. Use to check blood sugar once a day for for type 2 diabetes (E11.9) 100 each 4  . Lancets MISC Use to check blood sugar once daily for type 2 diabetes E11.9 100 each 4  . lisinopril (ZESTRIL) 10 MG tablet Take 10 mg by mouth daily.     . meloxicam (MOBIC) 15 MG tablet TAKE 1 TABLET BY MOUTH ONCE DAILY AS NEEDED FOR PAIN 90 tablet 3  . metFORMIN (GLUCOPHAGE-XR) 500 MG 24 hr tablet Take 1 tablet (500 mg total) by mouth 2 (two) times daily with a meal. 180 tablet 4  .  metoprolol succinate (TOPROL-XL) 25 MG 24 hr tablet Take 1 tablet (25 mg total) by mouth daily. 90 tablet 2  . montelukast (SINGULAIR) 10 MG tablet Take 1 tablet (10 mg total) by mouth at bedtime. 90 tablet 3  . NARCAN 4 MG/0.1ML LIQD nasal spray kit Place 0.4 mg into the nose once.   99  . oxyCODONE-acetaminophen (PERCOCET) 10-325 MG tablet One tablet every 4-6 hours as needed. 180 tablet 0  . sertraline (ZOLOFT) 50 MG tablet Take 1 tablet (50 mg total) by mouth daily. 30 tablet 11  . sucralfate (CARAFATE) 1 g tablet TAKE 1 TABLET BY MOUTH 4 TIMES DAILY WITH MEALS AND AT BEDTIME 360 tablet 0  . Tiotropium Bromide-Olodaterol 2.5-2.5 MCG/ACT AERS Inhale 2 puffs into the lungs daily. 4 g 3  . zaleplon (SONATA) 10 MG capsule TAKE 1 CAPSULE  BY MOUTH AT BEDTIME. MAY TAKE SECOND CAPSULE AFTER 2 HOURS IF NEEDED 30 capsule 0  . albuterol (VENTOLIN HFA) 108 (90 Base) MCG/ACT inhaler Inhale 2 puffs into the lungs every 6 (six) hours as needed for wheezing or shortness of breath. (Patient not taking: Reported on 03/23/2020) 18 g 3  . ANORO ELLIPTA 62.5-25 MCG/INH AEPB Inhale 1 puff into the lungs daily. (Patient not taking: Reported on 03/23/2020)    . benzonatate (TESSALON) 100 MG capsule Take 1 capsule by mouth three times daily as needed for cough (Patient not taking: Reported on 03/23/2020) 60 capsule 0  . ondansetron (ZOFRAN ODT) 4 MG disintegrating tablet Take 1 tablet (4 mg total) by mouth every 8 (eight) hours as needed for nausea or vomiting. (Patient not taking: Reported on 03/23/2020) 20 tablet 0   No current facility-administered medications for this visit.    OBJECTIVE: Vitals:   03/23/20 1442  BP: 123/83  Pulse: 100  Resp: 18  Temp: 99 F (37.2 C)     Body mass index is 35.24 kg/m.    ECOG FS:1 - Symptomatic but completely ambulatory  General: Well-developed, well-nourished, no acute distress.  Sitting in a wheelchair. Eyes: Pink conjunctiva, anicteric sclera. HEENT: Normocephalic, moist  mucous membranes. Lungs: No audible wheezing or coughing. Heart: Regular rate and rhythm. Abdomen: Soft, nontender, no obvious distention. Musculoskeletal: No edema, cyanosis, or clubbing. Neuro: Alert, answering all questions appropriately. Cranial nerves grossly intact. Skin: No rashes or petechiae noted. Psych: Normal affect.   LAB RESULTS:  Lab Results  Component Value Date   NA 141 09/07/2019   K 4.3 09/07/2019   CL 100 09/07/2019   CO2 21 09/07/2019   GLUCOSE 126 (H) 09/07/2019   BUN 12 09/07/2019   CREATININE 0.70 03/06/2020   CALCIUM 9.2 09/07/2019   PROT 7.4 09/07/2019   ALBUMIN 4.7 09/07/2019   AST 20 09/07/2019   ALT 13 09/07/2019   ALKPHOS 90 09/07/2019   BILITOT 0.4 09/07/2019   GFRNONAA 89 09/07/2019   GFRAA 102 09/07/2019    Lab Results  Component Value Date   WBC 6.9 09/07/2019   NEUTROABS 7.2 06/17/2018   HGB 12.2 09/07/2019   HCT 37.7 09/07/2019   MCV 95 09/07/2019   PLT 293 09/07/2019     STUDIES: CT CHEST W CONTRAST  Result Date: 03/07/2020 CLINICAL DATA:  Restaging small cell lung cancer. Initial diagnosis in July 2018. History of chemotherapy and radiation therapy. EXAM: CT CHEST WITH CONTRAST TECHNIQUE: Multidetector CT imaging of the chest was performed during intravenous contrast administration. CONTRAST:  23m OMNIPAQUE IOHEXOL 300 MG/ML  SOLN COMPARISON:  PET-CT 01/27/2019 and chest CT 09/10/2018 FINDINGS: Cardiovascular: The heart is normal in size. No pericardial effusion. The aorta is normal in caliber. No dissection. The branch vessels are patent. Stable coronary artery calcifications. Mediastinum/Nodes: No mediastinal or hilar mass or lymphadenopathy. Stable mild thickening of the midesophagus likely radiation related. Stable 8 mm paraesophageal lymph node on image number 88/2. Lungs/Pleura: Stable extensive radiation changes involving the right hilum and right paramediastinal lung. No findings suspicious for recurrent tumor. No suspicious  pulmonary nodules to suggest pulmonary metastatic disease. Patchy areas of tree-in-bud appearance in the right lung likely reflecting chronic inflammation or atypical infection such as MAC. I do not see any similar findings in the left lung. There was a small nodule noted in the right lower lobe on the previous which has resolved. No worrisome pulmonary nodules. Upper Abdomen: No significant upper abdominal findings. No hepatic or  adrenal gland lesions. No upper abdominal lymphadenopathy. Musculoskeletal: No breast masses, supraclavicular or axillary adenopathy. The bony thorax is intact. IMPRESSION: 1. Stable extensive radiation changes involving the right hilum and right paramediastinal lung. No findings suspicious for recurrent tumor. 2. No mediastinal or hilar mass or adenopathy. 3. Patchy areas of tree-in-bud appearance in the right lung likely reflecting chronic inflammation or atypical infection such as MAC. 4. No worrisome pulmonary nodules to suggest pulmonary metastatic disease. 5. No findings for upper abdominal metastatic disease. 6. Emphysema and aortic atherosclerosis. Aortic Atherosclerosis (ICD10-I70.0) and Emphysema (ICD10-J43.9). Electronically Signed   By: Marijo Sanes M.D.   On: 03/07/2020 10:38    ASSESSMENT: Clinical stage IIIB small cell lung cancer of the right lung.  PLAN:    1. Clinical stage IIIB small cell lung cancer of the right lung: Patient completed concurrent chemotherapy and XRT at outside facility and then transferred care to [facility] to complete treatment and long-term monitoring for recurrence. She completed cycle 6 of carboplatinum and etoposide on August 15, 2016.  She also completed PCI.  Her most recent imaging with CT scan on February 2022 reviewed independently and report as above with no obvious evidence of recurrent or progressive disease.  Continue follow-up and imaging yearly until patient is 5 years removed from completing her treatment in July 2023.  Return to  clinic in 1 year with repeat CT scan and further evaluation. 2.  Cough/shortness of breath: Chronic and unchanged.  Continue follow-up with pulmonology and cardiology as scheduled. 3.  Back pain: Chronic and unchanged.  Patient does not complain of this today.    Patient expressed understanding and was in agreement with this plan. She also understands that She can call clinic at any time with any questions, concerns, or complaints.   Cancer Staging Small cell lung cancer, right [facility]) Staging form: Lung, AJCC 8th Edition - Clinical stage from 08/18/2016: Stage IIIB (cT2a, cN3, cM0) - Signed by Lloyd Huger, MD on 08/18/2016 Laterality: Right   Lloyd Huger, MD   03/24/2020 6:31 AM

## 2020-03-23 ENCOUNTER — Encounter: Payer: Self-pay | Admitting: Oncology

## 2020-03-23 ENCOUNTER — Inpatient Hospital Stay: Payer: Medicare HMO | Attending: Oncology | Admitting: Oncology

## 2020-03-23 VITALS — BP 123/83 | HR 100 | Temp 99.0°F | Resp 18 | Wt 174.5 lb

## 2020-03-23 DIAGNOSIS — J439 Emphysema, unspecified: Secondary | ICD-10-CM | POA: Diagnosis not present

## 2020-03-23 DIAGNOSIS — Z79899 Other long term (current) drug therapy: Secondary | ICD-10-CM | POA: Diagnosis not present

## 2020-03-23 DIAGNOSIS — Z923 Personal history of irradiation: Secondary | ICD-10-CM | POA: Insufficient documentation

## 2020-03-23 DIAGNOSIS — C3491 Malignant neoplasm of unspecified part of right bronchus or lung: Secondary | ICD-10-CM | POA: Diagnosis not present

## 2020-03-23 DIAGNOSIS — Z9221 Personal history of antineoplastic chemotherapy: Secondary | ICD-10-CM | POA: Diagnosis not present

## 2020-03-23 DIAGNOSIS — E119 Type 2 diabetes mellitus without complications: Secondary | ICD-10-CM | POA: Insufficient documentation

## 2020-03-23 DIAGNOSIS — Z8673 Personal history of transient ischemic attack (TIA), and cerebral infarction without residual deficits: Secondary | ICD-10-CM | POA: Insufficient documentation

## 2020-03-23 DIAGNOSIS — Z87891 Personal history of nicotine dependence: Secondary | ICD-10-CM | POA: Diagnosis not present

## 2020-03-23 DIAGNOSIS — I10 Essential (primary) hypertension: Secondary | ICD-10-CM | POA: Insufficient documentation

## 2020-03-23 DIAGNOSIS — Z7984 Long term (current) use of oral hypoglycemic drugs: Secondary | ICD-10-CM | POA: Diagnosis not present

## 2020-03-23 DIAGNOSIS — G8929 Other chronic pain: Secondary | ICD-10-CM | POA: Diagnosis not present

## 2020-03-23 DIAGNOSIS — M549 Dorsalgia, unspecified: Secondary | ICD-10-CM | POA: Insufficient documentation

## 2020-03-23 NOTE — Progress Notes (Signed)
Pt in for 1 year follow up and CT results.

## 2020-03-31 ENCOUNTER — Other Ambulatory Visit: Payer: Self-pay | Admitting: Family Medicine

## 2020-04-03 ENCOUNTER — Other Ambulatory Visit: Payer: Self-pay | Admitting: Family Medicine

## 2020-04-03 ENCOUNTER — Telehealth: Payer: Self-pay | Admitting: Family Medicine

## 2020-04-03 ENCOUNTER — Other Ambulatory Visit: Payer: Self-pay

## 2020-04-03 DIAGNOSIS — G8929 Other chronic pain: Secondary | ICD-10-CM

## 2020-04-03 MED ORDER — METFORMIN HCL ER 500 MG PO TB24
500.0000 mg | ORAL_TABLET | Freq: Two times a day (BID) | ORAL | 0 refills | Status: DC
Start: 2020-04-03 — End: 2021-01-26

## 2020-04-03 NOTE — Telephone Encounter (Signed)
30 days supply dispensed 03-10-2020

## 2020-04-03 NOTE — Telephone Encounter (Signed)
Medication Refill - Medication: clopidogrel (PLAVIX) 75 MG tablet  Pt has one pill left and asked if this can be asap / please advise   Has the patient contacted their pharmacy? Yes.   (Agent: If no, request that the patient contact the pharmacy for the refill.) (Agent: If yes, when and what did the pharmacy advise?)call pcp  Preferred Pharmacy (with phone number or street name):   [facility] (N), [place] - [address], [address] ([place]) [place] [address]  Phone:  [phone] Fax:  [phone]   Agent: Please be advised that RX refills may take up to 3 business days. We ask that you follow-up with your pharmacy.

## 2020-04-03 NOTE — Telephone Encounter (Signed)
Patient would like the nurse to call her to go over her medications and let her know which ones need to get refilled.  She is unable to tell because of her stroke and would like to speak with the nurse.  CB# [phone]

## 2020-04-03 NOTE — Telephone Encounter (Signed)
Medication Refill - Medication: metFORMIN (GLUCOPHAGE-XR) 500 MG 24 hr tablet     Preferred Pharmacy (with phone number or street name):  [facility] [place] [place] - [place] Phone:  [phone]  Fax:  [phone]       Agent: Please be advised that RX refills may take up to 3 business days. We ask that you follow-up with your pharmacy.

## 2020-04-03 NOTE — Telephone Encounter (Signed)
Patient called requesting a refill

## 2020-04-07 MED ORDER — OXYCODONE-ACETAMINOPHEN 10-325 MG PO TABS: ORAL_TABLET | ORAL | 0 refills | Status: DC

## 2020-05-08 ENCOUNTER — Other Ambulatory Visit: Payer: Self-pay | Admitting: Family Medicine

## 2020-05-08 DIAGNOSIS — M5442 Lumbago with sciatica, left side: Secondary | ICD-10-CM

## 2020-05-08 DIAGNOSIS — G8929 Other chronic pain: Secondary | ICD-10-CM

## 2020-05-08 MED ORDER — OXYCODONE-ACETAMINOPHEN 10-325 MG PO TABS: ORAL_TABLET | ORAL | 0 refills | Status: DC

## 2020-05-08 NOTE — Telephone Encounter (Signed)
Requested medication (s) are due for refill today: yes  Requested medication (s) are on the active medication list: yes  Last refill:  04/07/20  Future visit scheduled: yes  Notes to clinic: not delegated    Requested Prescriptions  Pending Prescriptions Disp Refills   oxyCODONE-acetaminophen (PERCOCET) 10-325 MG tablet 180 tablet 0    Sig: One tablet every 4-6 hours as needed.      Not Delegated - Analgesics:  Opioid Agonist Combinations Failed - 05/08/2020 11:38 AM      Failed - This refill cannot be delegated      Failed - Urine Drug Screen completed in last 360 days      Passed - Valid encounter within last 6 months    Recent Outpatient Visits           3 months ago [place], Donald E, MD   8 months ago [place], Donald E, MD   11 months ago Essential hypertension   [facility] Birdie Sons, MD   11 months ago No-show for appointment   [facility] Birdie Sons, MD   1 year ago Type 2 diabetes mellitus with microalbuminuria, without long-term current use of insulin [facility])   [place], Kirstie Peri, MD       Future Appointments             Tomorrow Caryn [facility] Kirstie Peri, MD [facility] [place]

## 2020-05-08 NOTE — Telephone Encounter (Signed)
Medication Refill - Medication: oxyCODONE-acetaminophen (PERCOCET) 10-325 MG tablet    Has the patient contacted their pharmacy? No. (Agent: If no, request that the patient contact the pharmacy for the refill.) (Agent: If yes, when and what did the pharmacy advise?)  Preferred Pharmacy (with phone number or street name):  [facility] ([facility]), [place] - [address] [place] [address]  Phone: [phone] Fax: [phone]     Agent: Please be advised that RX refills may take up to 3 business days. We ask that you follow-up with your pharmacy.

## 2020-05-09 ENCOUNTER — Other Ambulatory Visit: Payer: Self-pay

## 2020-05-09 ENCOUNTER — Ambulatory Visit (INDEPENDENT_AMBULATORY_CARE_PROVIDER_SITE_OTHER): Payer: Medicare HMO | Admitting: Family Medicine

## 2020-05-09 ENCOUNTER — Encounter: Payer: Self-pay | Admitting: Family Medicine

## 2020-05-09 VITALS — BP 125/78 | HR 108 | Temp 97.9°F | Resp 16 | Ht 59.0 in | Wt 179.0 lb

## 2020-05-09 DIAGNOSIS — F32A Depression, unspecified: Secondary | ICD-10-CM

## 2020-05-09 DIAGNOSIS — R809 Proteinuria, unspecified: Secondary | ICD-10-CM

## 2020-05-09 DIAGNOSIS — I69393 Ataxia following cerebral infarction: Secondary | ICD-10-CM | POA: Diagnosis not present

## 2020-05-09 DIAGNOSIS — E781 Pure hyperglyceridemia: Secondary | ICD-10-CM | POA: Diagnosis not present

## 2020-05-09 DIAGNOSIS — Z8673 Personal history of transient ischemic attack (TIA), and cerebral infarction without residual deficits: Secondary | ICD-10-CM | POA: Diagnosis not present

## 2020-05-09 DIAGNOSIS — I10 Essential (primary) hypertension: Secondary | ICD-10-CM | POA: Diagnosis not present

## 2020-05-09 DIAGNOSIS — F419 Anxiety disorder, unspecified: Secondary | ICD-10-CM | POA: Diagnosis not present

## 2020-05-09 DIAGNOSIS — J449 Chronic obstructive pulmonary disease, unspecified: Secondary | ICD-10-CM

## 2020-05-09 DIAGNOSIS — E1129 Type 2 diabetes mellitus with other diabetic kidney complication: Secondary | ICD-10-CM

## 2020-05-09 DIAGNOSIS — R29818 Other symptoms and signs involving the nervous system: Secondary | ICD-10-CM

## 2020-05-09 DIAGNOSIS — R29898 Other symptoms and signs involving the musculoskeletal system: Secondary | ICD-10-CM

## 2020-05-09 DIAGNOSIS — F439 Reaction to severe stress, unspecified: Secondary | ICD-10-CM

## 2020-05-09 DIAGNOSIS — R69 Illness, unspecified: Secondary | ICD-10-CM | POA: Diagnosis not present

## 2020-05-09 LAB — POCT GLYCOSYLATED HEMOGLOBIN (HGB A1C)
Est. average glucose Bld gHb Est-mCnc: 134
Hemoglobin A1C: 6.3 % — AB (ref 4.0–5.6)

## 2020-05-09 MED ORDER — SERTRALINE HCL 50 MG PO TABS: 50.0000 mg | ORAL_TABLET | Freq: Every day | ORAL | 3 refills | Status: DC

## 2020-05-09 NOTE — Progress Notes (Signed)
Established patient visit   Patient: Yvette Riley   DOB: 1955/04/24   65 y.o. Female  MRN: [id number] Visit Date: 05/09/2020  Today's healthcare provider: Lelon Huh, MD   Chief Complaint  Patient presents with  . Anxiety  . Depression  . Diabetes  . COPD   Subjective    HPI  Depression, Follow-up  She  was last seen for this 4 months ago. Changes made at last visit include no changes.   She reports excellent compliance with treatment. She is not having side effects.  She reports excellent tolerance of treatment. Current symptoms include: depressed mood, difficulty concentrating, fatigue, feelings of worthlessness/guilt and insomnia She feels she is Worse since last visit.  Depression screen [facility] 2/9 05/09/2020 01/10/2020 12/08/2019  Decreased Interest 0 0 0  Down, Depressed, Hopeless 1 0 0  PHQ - 2 Score 1 0 0  Altered sleeping 2 1 -  Tired, decreased energy 0 1 -  Change in appetite 2 0 -  Feeling bad or failure about yourself  3 0 -  Trouble concentrating 2 0 -  Moving slowly or fidgety/restless 2 0 -  Suicidal thoughts 2 0 -  PHQ-9 Score 14 2 -  Difficult doing work/chores Very difficult Not difficult at all -  Some recent data might be hidden    ----------------------------------------------------------------------------------------- Diabetes Mellitus Type II, Follow-up  Lab Results  Component Value Date   HGBA1C 6.3 (A) 05/09/2020   HGBA1C 6.4 (H) 09/07/2019   HGBA1C 6.3 (A) 02/24/2019   Wt Readings from Last 3 Encounters:  05/09/20 179 lb (81.2 kg)  03/23/20 174 lb 8 oz (79.2 kg)  09/07/19 176 lb (79.8 kg)   Last seen for diabetes 6 months ago.  Management since then includes no changes. She reports excellent compliance with treatment. She is not having side effects. Symptoms: Yes fatigue No foot ulcerations  No appetite changes No nausea  Yes paresthesia of the feet  No polydipsia  No polyuria Yes visual disturbances   No vomiting      Home blood sugar records: fasting range: 120-150  Episodes of hypoglycemia? No   Current insulin regiment: none Most Recent Eye Exam: will schedule soon Current exercise: none Current diet habits: in general, an "unhealthy" diet  Pertinent Labs: Lab Results  Component Value Date   CHOL 185 06/17/2018   HDL 44 06/17/2018   LDLCALC 75 06/17/2018   LDLDIRECT 110 (H) 06/02/2018   TRIG 330 (H) 06/17/2018   CHOLHDL 4.2 06/17/2018   Lab Results  Component Value Date   NA 141 09/07/2019   K 4.3 09/07/2019   CREATININE 0.70 03/06/2020   GFRNONAA 89 09/07/2019   GFRAA 102 09/07/2019   GLUCOSE 126 (H) 09/07/2019     --------------------------------------------------------------------------------------------------- Follow up for chronic bilateral low back pain with bilateral sciatica  The patient was last seen for this 6 months ago. Changes made at last visit include no changes.  She reports excellent compliance with treatment. She feels that condition is Unchanged. She is not having side effects.  ----------------------------------------------------------------------------------------- COPD, Follow up  She was last seen for this 6 months ago. Changes made include change to no changes.   She reports excellent compliance with treatment. She is not having side effects. she uses rescue inhaler 3 per weeks. She IS experiencing cough and wheezing. She is NOT experiencing weight loss or fever. she reports breathing is Unchanged.  Pulmonary Functions Testing Results:  No results found for: FEV1, FVC, FEV1FVC,  TLC  ----------------------------------------------------------------------------------------- She has history of right cerebellar and occipital CVA in 2020 and report she continues to have persistent weakness and trouble with manipulation of objects in her right arm and hand, and weakness in her right leg with balance difficulties and several near falls. She was seen  once by Dr. Manuella Ghazi in July of 2020 but has not returned for follow up. She was prescribed sertraline, but she has apparently run out of that, but she has been more depressed since the death of her SO.     Patient Active Problem List   Diagnosis Date Noted  . 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 History of COPD 05/18/2019  . 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 Chronic, continuous use of opioids 08/05/2018  . Cerebellar infarct ([place]) 06/17/2018  . Chronic cough 06/02/2018  . Anxiety 07/19/2016  . Small cell lung cancer, right ([place]) 05/13/2016  . Bilateral presbyopia 05/06/2016  . Combined forms of age-related cataract of both eyes 05/06/2016  . Dry eye syndrome of both lacrimal glands 05/06/2016  . Chemoprophylaxis 04/29/2016  . Severe obesity (BMI 35.0-39.9) with comorbidity ([place]) 04/16/2016  . Elevated d-dimer 04/06/2016  . Weakness generalized 04/06/2016  . Mass of upper lobe of right lung 03/01/2016  . Coronary atherosclerosis 02/29/2016  . Allergic rhinitis 09/27/2014  . Asthma 09/27/2014  . Anemia 09/27/2014  . Hyperglyceridemia, pure 09/27/2014  . Glaucoma 09/27/2014  . Hip pain 09/27/2014  . Right knee pain 09/27/2014  . Displacement of lumbar intervertebral disc without myelopathy 09/27/2014  . Thoracic back pain 09/27/2014  . Insomnia 08/11/2014  . COPD (chronic obstructive pulmonary disease) ([place]) 06/12/2014  . Type 2 diabetes mellitus ([place]) 06/12/2014  . Back pain 06/12/2014  . Smoking greater than 30 pack years 03/28/2014   Social History   Tobacco Use  . Smoking status: Former Smoker    Packs/day: 0.25    Years: 45.00    Pack years: 11.25    Types: Cigarettes    Quit date: 03/17/2016    Years since quitting: 4.1  . Smokeless tobacco: Never Used  . Tobacco comment: pt states she quit in 2016-2017  Vaping Use  . Vaping Use: Never used  Substance Use Topics  . Alcohol use: No     Alcohol/week: 0.0 standard drinks  . Drug use: No   Allergies  Allergen Reactions  . Codeine Anaphylaxis  . Other Anaphylaxis  . Penicillins Anaphylaxis, Rash and Other (See Comments)    Reaction:  Tongue swelling  Has patient had a PCN reaction causing immediate rash, facial/tongue/throat swelling, SOB or lightheadedness with hypotension:  Yes   Has patient had a PCN reaction causing severe rash involving mucus membranes or skin necrosis: No Has patient had a PCN reaction that required hospitalization No Has patient had a PCN reaction occurring within the last 10 years: No If all of the above answers are "NO", then may proceed with Cephalosporin use.  . [place] Venom [Bee Venom] Anaphylaxis  . Crestor [Rosuvastatin] Nausea And Vomiting    Corrected prior adverse reaction per [facility] [employer]. On 12/02/3011  patient reported N & V when she takes Crestor  . Gemfibrozil Rash, Nausea And Vomiting and Swelling  . Trazodone And Nefazodone Nausea And Vomiting  . Lipitor [Atorvastatin] Nausea Only    By patient report 12/02/2011. Had also been prescribed pravastatin and lovastatin by previous MD. Unclear if those caused same side effects.  Medications: Outpatient Medications Prior to Visit  Medication Sig  . albuterol (VENTOLIN HFA) 108 (90 Base) MCG/ACT inhaler Inhale 2 puffs into the lungs every 6 (six) hours as needed for wheezing or shortness of breath.  Jearl Klinefelter ELLIPTA 62.5-25 MCG/INH AEPB Inhale 1 puff into the lungs daily.  . benzonatate (TESSALON) 100 MG capsule Take 1 capsule by mouth three times daily as needed for cough  . clonazePAM (KLONOPIN) 0.5 MG tablet Take 1 tablet (0.5 mg total) by mouth 2 (two) times daily as needed.  . clopidogrel (PLAVIX) 75 MG tablet Take 1 tablet by mouth once daily  . ezetimibe (ZETIA) 10 MG tablet Take 1 tablet by mouth once daily  . gabapentin (NEURONTIN) 600 MG tablet Take one tablet up to 4 times a day  . glucose blood test strip  Contour test strip. Use to check blood sugar once a day for for type 2 diabetes (E11.9)  . Lancets MISC Use to check blood sugar once daily for type 2 diabetes E11.9  . lisinopril (ZESTRIL) 10 MG tablet Take 10 mg by mouth daily.   . meloxicam (MOBIC) 15 MG tablet TAKE 1 TABLET BY MOUTH ONCE DAILY AS NEEDED FOR PAIN  . metFORMIN (GLUCOPHAGE-XR) 500 MG 24 hr tablet Take 1 tablet (500 mg total) by mouth 2 (two) times daily with a meal.  . metoprolol succinate (TOPROL-XL) 25 MG 24 hr tablet Take 1 tablet (25 mg total) by mouth daily.  . montelukast (SINGULAIR) 10 MG tablet Take 1 tablet (10 mg total) by mouth at bedtime.  [name] NARCAN 4 MG/0.1ML LIQD nasal spray kit Place 0.4 mg into the nose once.   . ondansetron (ZOFRAN ODT) 4 MG disintegrating tablet Take 1 tablet (4 mg total) by mouth every 8 (eight) hours as needed for nausea or vomiting.  [name] oxyCODONE-acetaminophen (PERCOCET) 10-325 MG tablet One tablet every 4-6 hours as needed.  . sertraline (ZOLOFT) 50 MG tablet Take 1 tablet (50 mg total) by mouth daily.  . sucralfate (CARAFATE) 1 g tablet TAKE 1 TABLET BY MOUTH 4 TIMES DAILY (WITH MEALS AND AT BEDTIME)  . Tiotropium Bromide-Olodaterol 2.5-2.5 MCG/ACT AERS Inhale 2 puffs into the lungs daily.  . zaleplon (SONATA) 10 MG capsule TAKE 1 CAPSULE BY MOUTH AT BEDTIME. MAY TAKE SECOND CAPSULE AFTER 2 HOURS IF NEEDED   No facility-administered medications prior to visit.    Review of Systems  Constitutional: Positive for activity change and fatigue. Negative for appetite change, chills and unexpected weight change.  Eyes: Positive for visual disturbance.  Respiratory: Positive for cough, shortness of breath and wheezing.   Cardiovascular: Negative for chest pain and palpitations.  Musculoskeletal: Positive for back pain, gait problem and myalgias.  Psychiatric/Behavioral: Positive for decreased concentration and sleep disturbance. The patient is nervous/anxious.         Objective    BP 125/78  (BP Location: Right Arm, Patient Position: Sitting, Cuff Size: Large)   Pulse (!) 108   Temp 97.9 F (36.6 C) (Oral)   Resp 16   Ht 4' 11" (1.499 m)   Wt 179 lb (81.2 kg)   SpO2 98%   BMI 36.15 kg/m  BP Readings from Last 3 Encounters:  05/09/20 125/78  03/23/20 123/83  12/17/19 112/86   Wt Readings from Last 3 Encounters:  05/09/20 179 lb (81.2 kg)  03/23/20 174 lb 8 oz (79.2 kg)  09/07/19 176 lb (79.8 kg)      Physical Exam    General: Appearance:  Mildly obese female in no acute distress  Eyes:    PERRL, conjunctiva/corneas clear, EOM's intact       Lungs:     Clear to auscultation bilaterally, respirations unlabored  Heart:    Tachycardic. Normal rhythm. No murmurs, rubs, or gallops.   MS:   All extremities are intact.   Neurologic:   Awake, alert, oriented x 3. Poor fine motor activities of left hand and poor balance.         Results for orders placed or performed in visit on 05/09/20  POCT glycosylated hemoglobin (Hb A1C)  Result Value Ref Range   Hemoglobin A1C 6.3 (A) 4.0 - 5.6 %   Est. average glucose Bld gHb Est-mCnc 134     Assessment & Plan     1. History of CVA in adulthood Continue clopidogrel. Intolerant to statins, continue ezetimibe.  - Ambulatory referral to Neurology  2. Fine motor impairment   3. 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 Discussed getting back in with PT/OT, but she feels she need further neuro re-evaluation.   4. Depression, unspecified depression type Start back on sertraline 95m daily  5. Type 2 diabetes mellitus with microalbuminuria, without long-term current use of insulin (HCC) Well controlled. Well controlled.  Continue current medications.    6. Essential hypertension Well controlled.  Continue current medications.    7. Anxiety Start back on sertraline as below.   8. Situational  stress  - sertraline (ZOLOFT) 50 MG tablet; Take 1 tablet (50 mg total) by mouth daily.  Dispense: 90 tablet; Refill: 3  9. Hyperglyceridemia, pure  - CBC - Comprehensive metabolic panel - Lipid panel  10. Chronic obstructive pulmonary disease, unspecified COPD type ([place] She reports current inhalers are working well        The entirety of the information documented in the History of Present Illness, Review of Systems and Physical Exam were personally obtained by me. Portions of this information were initially documented by the CMA and reviewed by me for thoroughness and accuracy.      DLelon Huh MD  [phone](phone) [phone](fax)  [place]

## 2020-05-09 NOTE — Patient Instructions (Addendum)
.   Please review the attached list of medications and notify my office if there are any errors.   . Please bring all of your medications to every appointment so we can make sure that our medication list is the same as yours.   . Please go to the lab draw station in [address] on the second floor of [facility]  when you are fasting for 8 hours. Normal hours are 8:00am to 11:30am and 1:00pm to 4:00pm Monday through Friday

## 2020-05-10 ENCOUNTER — Telehealth: Payer: Self-pay

## 2020-05-10 NOTE — Telephone Encounter (Signed)
Copied from CRM #[id number]. Topic: General - Other >> May 10, 2020  9:36 AM Mcneil, Ja-Kwan wrote: Reason for CRM: Pt stated her pharmacy informed her to contact provider to request Rx for a new Contour meter and test strips. Pt stated she will need the entire glucose meter kit. Pt requests that Rx is sent to [id number] - [place] ([place]), [place] - [address] 

## 2020-05-11 ENCOUNTER — Other Ambulatory Visit: Payer: Self-pay

## 2020-05-11 MED ORDER — CONTOUR BLOOD GLUCOSE SYSTEM W/DEVICE KIT: 1.0000 | PACK | Freq: Every day | 0 refills | Status: DC

## 2020-05-29 ENCOUNTER — Telehealth: Payer: Self-pay | Admitting: Family Medicine

## 2020-05-29 DIAGNOSIS — M5442 Lumbago with sciatica, left side: Secondary | ICD-10-CM

## 2020-05-29 DIAGNOSIS — G8929 Other chronic pain: Secondary | ICD-10-CM

## 2020-05-29 NOTE — Telephone Encounter (Signed)
Requested medications are due for refill today.  Yes  Requested medications are on the active medications list.  yes  Last refill. 05/08/2020  Future visit scheduled.   yes  Notes to clinic.  Medication not delegated.

## 2020-05-29 NOTE — Telephone Encounter (Signed)
Medication: oxyCODONE-acetaminophen (PERCOCET) 10-325 MG tablet [[id number]]   Has the patient contacted their pharmacy? YES (Agent: If no, request that the patient contact the pharmacy for the refill.) (Agent: If yes, when and what did the pharmacy advise?)  Preferred Pharmacy (with phone number or street name): [place] ([place]), [place] - [address] [place] ([place]) [place] [address] Phone: [phone] Fax: [phone] Hours: Not open 24 hours    Agent: Please be advised that RX refills may take up to 3 business days. We ask that you follow-up with your pharmacy.

## 2020-05-30 ENCOUNTER — Other Ambulatory Visit: Payer: Self-pay | Admitting: Family Medicine

## 2020-05-30 MED ORDER — GLUCOSE BLOOD VI STRP: ORAL_STRIP | 4 refills | Status: DC

## 2020-05-30 NOTE — Telephone Encounter (Signed)
Medication Refill - Medication: contour test strips  Has the patient contacted their pharmacy? Yes. Pt was told to call her md office (Preferred Pharmacy (with phone number or street name): [address] in [place] phone number [phone] Agent: Please be advised that RX refills may take up to 3 business days. We ask that you follow-up with your pharmacy.

## 2020-05-30 NOTE — Telephone Encounter (Signed)
30 day supply dispensed 05-09-2020. Is not due for refill until 06-07-2020

## 2020-05-30 NOTE — Telephone Encounter (Signed)
Pt is aware rx not due until 06-07-2020

## 2020-05-31 ENCOUNTER — Telehealth: Payer: Self-pay | Admitting: Family Medicine

## 2020-05-31 ENCOUNTER — Other Ambulatory Visit: Payer: Self-pay | Admitting: Family Medicine

## 2020-05-31 MED ORDER — ONETOUCH ULTRA 2 W/DEVICE KIT: PACK | 0 refills | Status: DC

## 2020-05-31 MED ORDER — ONETOUCH ULTRA VI STRP: ORAL_STRIP | 4 refills | Status: DC

## 2020-05-31 MED ORDER — ONETOUCH ULTRASOFT LANCETS MISC: 4 refills | Status: DC

## 2020-05-31 NOTE — Telephone Encounter (Addendum)
Pt staes her insurance company will not pay for the strips for her 64 year old meter. Pt needs new meter, lancels and test strips.  Pt needs a Contour Next meter with supplies  [place] ([place]), [place] - [place]

## 2020-05-31 NOTE — Progress Notes (Signed)
Per fax from [place] only OneTouch ultra products covered by pt insurance.

## 2020-05-31 NOTE — Telephone Encounter (Signed)
I called pharmacy and spoke with pharmacist. Per pharmacist, our office already sent over a prescription for OneTouch meter and supplies. Patient has picked supplies and no further action is needed.

## 2020-06-05 ENCOUNTER — Other Ambulatory Visit: Payer: Self-pay | Admitting: Family Medicine

## 2020-06-05 DIAGNOSIS — G8929 Other chronic pain: Secondary | ICD-10-CM

## 2020-06-05 DIAGNOSIS — M5442 Lumbago with sciatica, left side: Secondary | ICD-10-CM

## 2020-06-05 NOTE — Telephone Encounter (Signed)
Requested medication (s) are due for refill today: yes  Requested medication (s) are on the active medication list: yes  Last refill:  05/08/2020  Future visit scheduled: yes  Notes to clinic:  this refill cannot be delegated   Requested Prescriptions  Pending Prescriptions Disp Refills   oxyCODONE-acetaminophen (PERCOCET) 10-325 MG tablet 180 tablet 0    Sig: One tablet every 4-6 hours as needed.      There is no refill protocol information for this order

## 2020-06-05 NOTE — Telephone Encounter (Signed)
Medication Refill - Medication:   oxyCODONE-acetaminophen (PERCOCET) 10-325 MG tablet     Preferred Pharmacy (with phone number or street name):  [facility] [place] - [place] Phone:  [phone]  Fax:  [phone]       Agent: Please be advised that RX refills may take up to 3 business days. We ask that you follow-up with your pharmacy.

## 2020-06-06 NOTE — Telephone Encounter (Signed)
Pt states she has only 2 pills of the oxycodone.  Hopes her Rx will be sent in today for fill on 06/07/20

## 2020-06-07 MED ORDER — OXYCODONE-ACETAMINOPHEN 10-325 MG PO TABS: ORAL_TABLET | ORAL | 0 refills | Status: DC

## 2020-06-19 ENCOUNTER — Other Ambulatory Visit: Payer: Self-pay

## 2020-06-19 ENCOUNTER — Ambulatory Visit (INDEPENDENT_AMBULATORY_CARE_PROVIDER_SITE_OTHER): Payer: Medicare HMO | Admitting: Family Medicine

## 2020-06-19 ENCOUNTER — Telehealth: Payer: Self-pay

## 2020-06-19 VITALS — BP 119/79 | HR 100 | Ht <= 58 in | Wt 183.6 lb

## 2020-06-19 DIAGNOSIS — F5101 Primary insomnia: Secondary | ICD-10-CM

## 2020-06-19 DIAGNOSIS — I69393 Ataxia following cerebral infarction: Secondary | ICD-10-CM

## 2020-06-19 DIAGNOSIS — R809 Proteinuria, unspecified: Secondary | ICD-10-CM

## 2020-06-19 DIAGNOSIS — R053 Chronic cough: Secondary | ICD-10-CM

## 2020-06-19 DIAGNOSIS — E1129 Type 2 diabetes mellitus with other diabetic kidney complication: Secondary | ICD-10-CM

## 2020-06-19 DIAGNOSIS — F419 Anxiety disorder, unspecified: Secondary | ICD-10-CM

## 2020-06-19 DIAGNOSIS — I663 Occlusion and stenosis of cerebellar arteries: Secondary | ICD-10-CM

## 2020-06-19 DIAGNOSIS — R69 Illness, unspecified: Secondary | ICD-10-CM | POA: Diagnosis not present

## 2020-06-19 DIAGNOSIS — E781 Pure hyperglyceridemia: Secondary | ICD-10-CM

## 2020-06-19 DIAGNOSIS — I679 Cerebrovascular disease, unspecified: Secondary | ICD-10-CM | POA: Diagnosis not present

## 2020-06-19 DIAGNOSIS — R270 Ataxia, unspecified: Secondary | ICD-10-CM | POA: Diagnosis not present

## 2020-06-19 DIAGNOSIS — M5126 Other intervertebral disc displacement, lumbar region: Secondary | ICD-10-CM

## 2020-06-19 MED ORDER — CLONAZEPAM 0.5 MG PO TABS: 0.5000 mg | ORAL_TABLET | Freq: Two times a day (BID) | ORAL | 3 refills | Status: DC | PRN

## 2020-06-19 MED ORDER — BENZONATATE 100 MG PO CAPS: ORAL_CAPSULE | ORAL | 0 refills | Status: DC

## 2020-06-19 MED ORDER — ZALEPLON 10 MG PO CAPS: ORAL_CAPSULE | ORAL | 5 refills | Status: DC

## 2020-06-19 NOTE — Telephone Encounter (Signed)
Patient was seen today in the office and forgot to ask for a refill on Clonazepam. Please advise.

## 2020-06-19 NOTE — Progress Notes (Signed)
    Established patient visit   Patient: Yvette Riley   DOB: 01/14/1956   64 y.o. Female  MRN: [id number] Visit Date: 06/19/2020  Today's healthcare provider: Donald Fisher, MD   No chief complaint on file.  Subjective     Right arm pain Patient has had right arm pain and right arm twitching for over one month now. Patient describes pain as tight and heavy. Patient says there is a shooting pain from shoulder area to right hand. Patient is currently not taking prescribed medication for this but is taking oxycodone for another condition.   Right leg pain  Patient has had right leg pain and right leg twitching for over one month now. Patient describes pain as tight and heavy. Patient says pain is a shooting pain from knee to lower leg. Patient is currently not taking prescribed medication for this but is taking oxycodone for another condition.   Follow up balance problems.  She has history cerebellar by MRI in 2020.she was seen here April 12 with worsening ataxia and referred to Dr. Shah. She had appt scheduled on 4-26, but she states it was cancelled by his office and she keeps forgetting to call to reschedule. She has since developed and involuntary movement of her right arm and leg, and difficulty with balance continues to progress.      Medications: Outpatient Medications Prior to Visit  Medication Sig  . albuterol (VENTOLIN HFA) 108 (90 Base) MCG/ACT inhaler Inhale 2 puffs into the lungs every 6 (six) hours as needed for wheezing or shortness of breath.  . ANORO ELLIPTA 62.5-25 MCG/INH AEPB Inhale 1 puff into the lungs daily.  . benzonatate (TESSALON) 100 MG capsule Take 1 capsule by mouth three times daily as needed for cough  . Blood Glucose Monitoring Suppl (ONE TOUCH ULTRA 2) w/Device KIT Use to check sugar daily for type 2 diabetes E11.9  . clonazePAM (KLONOPIN) 0.5 MG tablet Take 1 tablet (0.5 mg total) by mouth 2 (two) times daily as needed.  . clopidogrel (PLAVIX) 75  MG tablet Take 1 tablet by mouth once daily  . ezetimibe (ZETIA) 10 MG tablet Take 1 tablet by mouth once daily  . gabapentin (NEURONTIN) 600 MG tablet Take one tablet up to 4 times a day  . glucose blood (ONETOUCH ULTRA) test strip Use to check blood sugar daily for type 2 diabetes E11.9  . Lancets (ONETOUCH ULTRASOFT) lancets Use to check sugar daily for type 2 diabetes E11.9  . lisinopril (ZESTRIL) 10 MG tablet Take 10 mg by mouth daily.   . meloxicam (MOBIC) 15 MG tablet TAKE 1 TABLET BY MOUTH ONCE DAILY AS NEEDED FOR PAIN  . metFORMIN (GLUCOPHAGE-XR) 500 MG 24 hr tablet Take 1 tablet (500 mg total) by mouth 2 (two) times daily with a meal.  . metoprolol succinate (TOPROL-XL) 25 MG 24 hr tablet Take 1 tablet (25 mg total) by mouth daily.  . montelukast (SINGULAIR) 10 MG tablet Take 1 tablet (10 mg total) by mouth at bedtime.  . NARCAN 4 MG/0.1ML LIQD nasal spray kit Place 0.4 mg into the nose once.   . ondansetron (ZOFRAN ODT) 4 MG disintegrating tablet Take 1 tablet (4 mg total) by mouth every 8 (eight) hours as needed for nausea or vomiting.  . oxyCODONE-acetaminophen (PERCOCET) 10-325 MG tablet One tablet every 4-6 hours as needed.  . sertraline (ZOLOFT) 50 MG tablet Take 1 tablet (50 mg total) by mouth daily.  . sucralfate (CARAFATE) 1 g tablet   TAKE 1 TABLET BY MOUTH 4 TIMES DAILY (WITH MEALS AND AT BEDTIME)  . Tiotropium Bromide-Olodaterol 2.5-2.5 MCG/ACT AERS Inhale 2 puffs into the lungs daily.  . zaleplon (SONATA) 10 MG capsule TAKE 1 CAPSULE BY MOUTH AT BEDTIME. MAY TAKE SECOND CAPSULE AFTER 2 HOURS IF NEEDED   No facility-administered medications prior to visit.    Review of Systems     Objective    There were no vitals taken for this visit.    Physical Exam   General appearance: Obese female, cooperative and in no acute distress Head: Normocephalic, without obvious abnormality, atraumatic Respiratory: Respirations even and unlabored, normal respiratory  rate Extremities: All extremities are intact.  Skin: Skin color, texture, turgor normal. No rashes seen  Psych: Appropriate mood and affect. Neurologic: Mental status: Alert, oriented to person, place, and time, thought content appropriate. Unable to ambulate without assistance due to gait disturbance.     Assessment & Plan     1. Chronic cough Needs refill - benzonatate (TESSALON) 100 MG capsule; Take 1 capsule by mouth three times daily as needed for cough  Dispense: 60 capsule; Refill: 0  2. 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  - MR Brain W Wo Contrast; Future  3. Embolism of superior cerebellar artery   4. Cerebrovascular disease   5. Primary insomnia refill- zaleplon (SONATA) 10 MG capsule; TAKE 1 CAPSULE BY MOUTH AT BEDTIME. MAY TAKE SECOND CAPSULE AFTER 2 HOURS IF NEEDED  Dispense: 30 capsule; Refill: 5  6. Type 2 diabetes mellitus with microalbuminuria, without long-term current use of insulin (HCC) Check A1c at follow up.   7. Hyperglyceridemia, pure  - CBC - Comprehensive metabolic panel - Lipid panel   No follow-ups on file.      The entirety of the information documented in the History of Present Illness, Review of Systems and Physical Exam were personally obtained by me. Portions of this information were initially documented by the CMA and reviewed by me for thoroughness and accuracy.      Donald Fisher, MD  [facility] [phone] (phone) [phone] (fax)  [facility] 

## 2020-06-19 NOTE — Telephone Encounter (Signed)
Patient is in need of Gabapentin sent to [place].

## 2020-06-20 LAB — CBC
Hematocrit: 36.8 % (ref 34.0–46.6)
Hemoglobin: 12.4 g/dL (ref 11.1–15.9)
MCH: 33.9 pg — ABNORMAL HIGH (ref 26.6–33.0)
MCHC: 33.7 g/dL (ref 31.5–35.7)
MCV: 101 fL — ABNORMAL HIGH (ref 79–97)
Platelets: 326 10*3/uL (ref 150–450)
RBC: 3.66 x10E6/uL — ABNORMAL LOW (ref 3.77–5.28)
RDW: 14.2 % (ref 11.7–15.4)
WBC: 9.4 10*3/uL (ref 3.4–10.8)

## 2020-06-20 LAB — COMPREHENSIVE METABOLIC PANEL
ALT: 11 IU/L (ref 0–32)
AST: 27 IU/L (ref 0–40)
Albumin/Globulin Ratio: 2 (ref 1.2–2.2)
Albumin: 4.9 g/dL — ABNORMAL HIGH (ref 3.8–4.8)
Alkaline Phosphatase: 102 IU/L (ref 44–121)
BUN/Creatinine Ratio: 20 (ref 12–28)
BUN: 11 mg/dL (ref 8–27)
Bilirubin Total: 0.5 mg/dL (ref 0.0–1.2)
CO2: 24 mmol/L (ref 20–29)
Calcium: 9.2 mg/dL (ref 8.7–10.3)
Chloride: 99 mmol/L (ref 96–106)
Creatinine, Ser: 0.56 mg/dL — ABNORMAL LOW (ref 0.57–1.00)
Globulin, Total: 2.4 g/dL (ref 1.5–4.5)
Glucose: 128 mg/dL — ABNORMAL HIGH (ref 65–99)
Potassium: 4.2 mmol/L (ref 3.5–5.2)
Sodium: 140 mmol/L (ref 134–144)
Total Protein: 7.3 g/dL (ref 6.0–8.5)
eGFR: 102 mL/min/{1.73_m2} (ref >59)

## 2020-06-20 LAB — LIPID PANEL
Chol/HDL Ratio: 6.5 ratio — ABNORMAL HIGH (ref 0.0–4.4)
Cholesterol, Total: 228 mg/dL — ABNORMAL HIGH (ref 100–199)
HDL: 35 mg/dL — ABNORMAL LOW (ref >39)
LDL Chol Calc (NIH): 82 mg/dL (ref 0–99)
Triglycerides: 689 mg/dL (ref 0–149)
VLDL Cholesterol Cal: 111 mg/dL — ABNORMAL HIGH (ref 5–40)

## 2020-06-20 MED ORDER — GABAPENTIN 600 MG PO TABS: ORAL_TABLET | ORAL | 1 refills | Status: DC

## 2020-06-21 ENCOUNTER — Other Ambulatory Visit: Payer: Self-pay

## 2020-06-21 MED ORDER — FENOFIBRATE 160 MG PO TABS: 160.0000 mg | ORAL_TABLET | Freq: Every day | ORAL | 0 refills | Status: DC

## 2020-06-29 ENCOUNTER — Telehealth: Payer: Self-pay | Admitting: *Deleted

## 2020-06-29 NOTE — Telephone Encounter (Signed)
Patient states she is returning call from the office- in review of chart- do not see call with notes- reviewed recent lab results, future appointments and will send call to office to make sure they were not trying to reach her for any other reason.

## 2020-06-30 ENCOUNTER — Other Ambulatory Visit: Payer: Self-pay

## 2020-06-30 DIAGNOSIS — J449 Chronic obstructive pulmonary disease, unspecified: Secondary | ICD-10-CM

## 2020-06-30 MED ORDER — TIOTROPIUM BROMIDE-OLODATEROL 2.5-2.5 MCG/ACT IN AERS: 2.0000 | INHALATION_SPRAY | Freq: Every day | RESPIRATORY_TRACT | 3 refills | Status: DC
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
## 2020-07-04 ENCOUNTER — Telehealth: Payer: Self-pay

## 2020-07-04 MED ORDER — IRON (FERROUS SULFATE) 325 (65 FE) MG PO TABS: 325.0000 mg | ORAL_TABLET | Freq: Every day | ORAL | 3 refills | Status: DC

## 2020-07-04 NOTE — Telephone Encounter (Signed)
Copied from [place]. Topic: General - Other >> Jul 04, 2020  8:42 AM Keene Breath wrote: Reason for CRM: Patient called to ask if the doctor could send in medication for her iron.  She stated that she thinks she needs some iron because she feels tired.  Please advise and call to discuss further

## 2020-07-04 NOTE — Telephone Encounter (Signed)
Please review.  Does she need an appointment?   Thanks,   -Mickel Baas

## 2020-07-04 NOTE — Addendum Note (Signed)
Addended by: Lelon Huh E on: 07/04/2020 12:13 PM   Modules accepted: Orders

## 2020-07-07 ENCOUNTER — Telehealth: Payer: Self-pay | Admitting: Family Medicine

## 2020-07-07 DIAGNOSIS — G8929 Other chronic pain: Secondary | ICD-10-CM

## 2020-07-07 DIAGNOSIS — M5442 Lumbago with sciatica, left side: Secondary | ICD-10-CM

## 2020-07-07 NOTE — Telephone Encounter (Signed)
Medication Refill - Medication: oxyCODONE-acetaminophen (PERCOCET) 10-325 MG tablet    Preferred Pharmacy (with phone number or street name):  [facility]), [place] - [place] Phone:  [phone]  Fax:  [phone]      Agent: Please be advised that RX refills may take up to 3 business days. We ask that you follow-up with your pharmacy.

## 2020-07-07 NOTE — Telephone Encounter (Signed)
Requested medication (s) are due for refill today: yes   Requested medication (s) are on the active medication list: yes   Last refill: 06/07/2020  Future visit scheduled: yes   Notes to clinic: this refill cannot be delegated   Requested Prescriptions  Pending Prescriptions Disp Refills   oxyCODONE-acetaminophen (PERCOCET) 10-325 MG tablet 180 tablet 0    Sig: One tablet every 4-6 hours as needed.      There is no refill protocol information for this order

## 2020-07-08 MED ORDER — OXYCODONE-ACETAMINOPHEN 10-325 MG PO TABS: ORAL_TABLET | ORAL | 0 refills | Status: DC

## 2020-07-17 ENCOUNTER — Other Ambulatory Visit: Payer: Self-pay | Admitting: Family Medicine

## 2020-07-17 DIAGNOSIS — F419 Anxiety disorder, unspecified: Secondary | ICD-10-CM

## 2020-07-17 NOTE — Telephone Encounter (Signed)
Requested medication (s) are due for refill today: Yes  Requested medication (s) are on the active medication list: Yes  Last refill:  06/19/20  Future visit scheduled: Yes  Notes to clinic:  Unable to refill per protocol, cannot delegate.      Requested Prescriptions  Pending Prescriptions Disp Refills   clonazePAM (KLONOPIN) 0.5 MG tablet 60 tablet 3    Sig: Take 1 tablet (0.5 mg total) by mouth 2 (two) times daily as needed.      Not Delegated - Psychiatry:  Anxiolytics/Hypnotics Failed - 07/17/2020 12:01 PM      Failed - This refill cannot be delegated      Failed - Urine Drug Screen completed in last 360 days      Passed - Valid encounter within last 6 months    Recent Outpatient Visits           4 weeks ago [place], MD   2 months ago History of CVA in adulthood   [place], Kirstie Peri, MD   6 months ago [place], Donald E, MD   10 months ago [place], Donald E, MD   1 year ago Essential hypertension   [facility] Birdie Sons, MD       Future Appointments             In 2 months [place], Kirstie Peri, MD [facility], [place]

## 2020-07-17 NOTE — Telephone Encounter (Signed)
Medication Refill - Medication: clonazePAM (KLONOPIN) 0.5 MG tablet     Preferred Pharmacy (with phone number or street name):  [facility] [place] Phone:  [phone]  Fax:  [phone]      Agent: Please be advised that RX refills may take up to 3 business days. We ask that you follow-up with your pharmacy.

## 2020-07-18 ENCOUNTER — Telehealth: Payer: Self-pay

## 2020-07-18 DIAGNOSIS — R809 Proteinuria, unspecified: Secondary | ICD-10-CM

## 2020-07-18 DIAGNOSIS — E1129 Type 2 diabetes mellitus with other diabetic kidney complication: Secondary | ICD-10-CM

## 2020-07-18 DIAGNOSIS — Z1231 Encounter for screening mammogram for malignant neoplasm of breast: Secondary | ICD-10-CM

## 2020-07-18 NOTE — Telephone Encounter (Signed)
-----   Message from Sofie Rower, [place] sent at 07/18/2020 12:51 PM EDT ----- Regarding: Diabetic Retinopathy and Mammogram Contact: [phone] Hello, patient would like to be scheduled for a diabetic retinopathy exam. Patient stated she would like for [facility] to schedule this because the previous eye doctor she saw does not accept her insurance any longer. Patient also needs a mammogram set up with [facility] and based on previous encounters they will need the order from you so that the patient can be scheduled. Thank you for your help in getting the patients the care they need.

## 2020-07-20 MED ORDER — CLONAZEPAM 0.5 MG PO TABS: 0.5000 mg | ORAL_TABLET | Freq: Two times a day (BID) | ORAL | 3 refills | Status: DC | PRN

## 2020-08-01 ENCOUNTER — Other Ambulatory Visit: Payer: Self-pay | Admitting: Family Medicine

## 2020-08-01 DIAGNOSIS — M5126 Other intervertebral disc displacement, lumbar region: Secondary | ICD-10-CM

## 2020-08-01 DIAGNOSIS — G8929 Other chronic pain: Secondary | ICD-10-CM

## 2020-08-01 DIAGNOSIS — M5442 Lumbago with sciatica, left side: Secondary | ICD-10-CM

## 2020-08-01 NOTE — Telephone Encounter (Signed)
Requested medication (s) are due for refill today: yes  Requested medication (s) are on the active medication list: yes  Last refill: 07/07/20  Future visit scheduled: yes  Notes to clinic:  this refill cannot be delegated    Requested Prescriptions  Pending Prescriptions Disp Refills   oxyCODONE-acetaminophen (PERCOCET) 10-325 MG tablet 180 tablet 0    Sig: One tablet every 4-6 hours as needed.      Not Delegated - Analgesics:  Opioid Agonist Combinations Failed - 08/01/2020  3:14 PM      Failed - This refill cannot be delegated      Failed - Urine Drug Screen completed in last 360 days      Passed - Valid encounter within last 6 months    Recent Outpatient Visits           1 month ago [place] Birdie Sons, MD   2 months ago History of CVA in adulthood   [place], Kirstie Peri, MD   6 months ago [place], Donald E, MD   10 months ago [place], Donald E, MD   1 year ago Essential hypertension   [place], Kirstie Peri, MD       Future Appointments             In 1 month Fisher, Kirstie Peri, MD [facility]               gabapentin (NEURONTIN) 600 MG tablet 360 tablet 1    Sig: Take one tablet up to 4 times a day      Neurology: Anticonvulsants - gabapentin Passed - 08/01/2020  3:14 PM      Passed - Valid encounter within last 12 months    Recent Outpatient Visits           1 month ago [place], Donald E, MD   2 months ago History of CVA in adulthood   [place], Kirstie Peri, MD   6 months ago [place], Donald E, MD   10 months ago [place], Donald E, MD   1 year ago Essential hypertension   [facility] Birdie Sons, MD       Future  Appointments             In 1 month Fisher, Kirstie Peri, MD [facility], [place]

## 2020-08-01 NOTE — Telephone Encounter (Signed)
Medication Refill - Medication:  -oxyCODONE-acetaminophen (PERCOCET) 10-325 MG tablet -gabapentin (NEURONTIN) 600 MG tablet   Has the patient contacted their pharmacy? No. Pt stated that it was a little early for the medication refill but pt states PCP asked her to remind him when she needed a refill.   Preferred Pharmacy (with phone number or street name): [facility]), [place] - [place]  Phone:  [phone] Fax:  [phone]  Agent: Please be advised that RX refills may take up to 3 business days. We ask that you follow-up with your pharmacy.

## 2020-08-03 NOTE — Telephone Encounter (Signed)
Last dispensed 30 days supply 07-08-2020

## 2020-08-07 ENCOUNTER — Other Ambulatory Visit: Payer: Self-pay | Admitting: Family Medicine

## 2020-08-07 DIAGNOSIS — G8929 Other chronic pain: Secondary | ICD-10-CM

## 2020-08-07 NOTE — Telephone Encounter (Signed)
Pt is leaving town for a family vacation, she is requesting to have her refill submitted as soon as possible. She leaves today and will not be back in town beyond today for a while.   [facility] [address]), [place] [address]  [place] [address]  Phone: [phone] Fax: [phone]

## 2020-08-07 NOTE — Telephone Encounter (Signed)
Medication Refill - Medication:   oxyCODONE-acetaminophen (PERCOCET) 10-325 MG tablet   Has the patient contacted their pharmacy? Need doctor approval.    Preferred Pharmacy (with phone number or street name):   [facility] [place] - [place], [place] [address]  Phone:  [phone]  Fax:  [phone]   Agent: Please be advised that RX refills may take up to 3 business days. We ask that you follow-up with your pharmacy.

## 2020-08-07 NOTE — Telephone Encounter (Signed)
Please review for refill. Refill not delegated per protocol.  Last refilled on 07/08/20 #180 with 0 refills.  Next OV: 09/25/20

## 2020-08-07 NOTE — Telephone Encounter (Signed)
Requested medication (s) are due for refill today: yes  Requested medication (s) are on the active medication list: yes  Last refill:  07/08/20 #180 0 refill  Future visit scheduled: yes in 1 month   Notes to clinic:  not delegated per protocol     Requested Prescriptions  Pending Prescriptions Disp Refills   oxyCODONE-acetaminophen (PERCOCET) 10-325 MG tablet 180 tablet 0    Sig: One tablet every 4-6 hours as needed.      Not Delegated - Analgesics:  Opioid Agonist Combinations Failed - 08/07/2020  1:11 PM      Failed - This refill cannot be delegated      Failed - Urine Drug Screen completed in last 360 days      Passed - Valid encounter within last 6 months    Recent Outpatient Visits           1 month ago [place], MD   3 months ago History of CVA in adulthood   [place], Kirstie Peri, MD   7 months ago [place], Donald E, MD   11 months ago [place], Donald E, MD   1 year ago Essential hypertension   [facility] Birdie Sons, MD       Future Appointments             In 1 month Fisher, Kirstie Peri, MD [facility], [place]

## 2020-08-08 ENCOUNTER — Telehealth: Payer: Self-pay

## 2020-08-08 MED ORDER — GABAPENTIN 600 MG PO TABS: ORAL_TABLET | ORAL | 1 refills | Status: DC

## 2020-08-08 MED ORDER — ANORO ELLIPTA 62.5-25 MCG/INH IN AEPB: 1.0000 | INHALATION_SPRAY | Freq: Every day | RESPIRATORY_TRACT | 5 refills | Status: DC

## 2020-08-08 MED ORDER — OXYCODONE-ACETAMINOPHEN 10-325 MG PO TABS: ORAL_TABLET | ORAL | 0 refills | Status: DC

## 2020-08-08 NOTE — Telephone Encounter (Signed)
[facility] faxed refill request for the following medications:  ANORO ELLIPTA 62.5-25 MCG/INH AEPB   Please advise.

## 2020-08-14 ENCOUNTER — Ambulatory Visit: Payer: Medicare HMO

## 2020-08-24 ENCOUNTER — Ambulatory Visit
Admission: RE | Admit: 2020-08-24 | Discharge: 2020-08-24 | Disposition: A | Discharge status: Home / Self Care | Payer: Medicare HMO | Source: Ambulatory Visit | Attending: Family Medicine | Admitting: Family Medicine

## 2020-08-24 ENCOUNTER — Other Ambulatory Visit: Payer: Self-pay

## 2020-08-24 DIAGNOSIS — R270 Ataxia, unspecified: Secondary | ICD-10-CM | POA: Diagnosis not present

## 2020-08-24 DIAGNOSIS — I639 Cerebral infarction, unspecified: Secondary | ICD-10-CM | POA: Diagnosis not present

## 2020-08-24 MED ORDER — GADOBUTROL 1 MMOL/ML IV SOLN
7.5000 mL | Freq: Once | INTRAVENOUS | Status: AC | PRN
  Administered 2020-08-24: 7.5 mL via INTRAVENOUS

## 2020-08-25 ENCOUNTER — Other Ambulatory Visit: Payer: Self-pay | Admitting: Family Medicine

## 2020-08-28 ENCOUNTER — Other Ambulatory Visit: Payer: Self-pay | Admitting: Family Medicine

## 2020-08-28 ENCOUNTER — Telehealth: Payer: Self-pay

## 2020-08-28 MED ORDER — CLOPIDOGREL BISULFATE 75 MG PO TABS: 75.0000 mg | ORAL_TABLET | Freq: Every day | ORAL | 1 refills | Status: DC

## 2020-08-28 NOTE — Telephone Encounter (Signed)
Rx already sent to pharmacy.

## 2020-08-28 NOTE — Telephone Encounter (Signed)
Copied from [place] [id number]. Topic: General - Other >> Aug 28, 2020 11:18 AM Yvette Rack wrote: Reason for CRM: Pt stated she lost her medication (clopidogrel (PLAVIX) 75 MG tablet) and she needs a new Rx to be sent to her pharmacy.

## 2020-08-31 ENCOUNTER — Telehealth: Payer: Self-pay

## 2020-08-31 DIAGNOSIS — R270 Ataxia, unspecified: Secondary | ICD-10-CM

## 2020-08-31 DIAGNOSIS — Z8673 Personal history of transient ischemic attack (TIA), and cerebral infarction without residual deficits: Secondary | ICD-10-CM

## 2020-08-31 DIAGNOSIS — E785 Hyperlipidemia, unspecified: Secondary | ICD-10-CM

## 2020-08-31 DIAGNOSIS — E1169 Type 2 diabetes mellitus with other specified complication: Secondary | ICD-10-CM

## 2020-08-31 NOTE — Telephone Encounter (Signed)
-----  Message from Birdie Sons, MD sent at 08/28/2020  8:01 AM EDT ----- MRI shows she has had a few small strokes in the cerebellum, which controls balance.  She needs referral to neurology, she can either be referred back to Dr. Manuella Ghazi or to [facility] neurology.  Need referral to physical therapy for ataxia and CVA.  Also need to stop by lab and check fasting lipids, met C and CBC since  staring the fenofibrate in May

## 2020-08-31 NOTE — Telephone Encounter (Signed)
Pt advised. She agreed to proceed with the referrals.  She would like to stay in [place] if possible.  Labs have been order, she is going to try and get them done before her appointment 09/25/2020  Thanks,   -Mickel Baas

## 2020-09-01 NOTE — Telephone Encounter (Signed)
Patient called back about discussing referral notes. Please all back

## 2020-09-04 NOTE — Telephone Encounter (Signed)
Pt asked if Yvette Riley can please give her a call today about her MRI and next step  / please advise

## 2020-09-13 DIAGNOSIS — R4189 Other symptoms and signs involving cognitive functions and awareness: Secondary | ICD-10-CM | POA: Diagnosis not present

## 2020-09-13 DIAGNOSIS — G479 Sleep disorder, unspecified: Secondary | ICD-10-CM | POA: Diagnosis not present

## 2020-09-13 DIAGNOSIS — I6930 Unspecified sequelae of cerebral infarction: Secondary | ICD-10-CM | POA: Diagnosis not present

## 2020-09-13 DIAGNOSIS — I639 Cerebral infarction, unspecified: Secondary | ICD-10-CM | POA: Diagnosis not present

## 2020-09-13 DIAGNOSIS — G629 Polyneuropathy, unspecified: Secondary | ICD-10-CM | POA: Diagnosis not present

## 2020-09-13 DIAGNOSIS — G4733 Obstructive sleep apnea (adult) (pediatric): Secondary | ICD-10-CM | POA: Diagnosis not present

## 2020-09-19 ENCOUNTER — Ambulatory Visit: Payer: Medicare HMO | Attending: Family Medicine

## 2020-09-21 ENCOUNTER — Ambulatory Visit: Payer: Medicare HMO

## 2020-09-25 ENCOUNTER — Other Ambulatory Visit: Payer: Self-pay

## 2020-09-25 ENCOUNTER — Ambulatory Visit (INDEPENDENT_AMBULATORY_CARE_PROVIDER_SITE_OTHER): Payer: Medicare HMO | Admitting: Family Medicine

## 2020-09-25 DIAGNOSIS — E785 Hyperlipidemia, unspecified: Secondary | ICD-10-CM

## 2020-09-25 DIAGNOSIS — Z8673 Personal history of transient ischemic attack (TIA), and cerebral infarction without residual deficits: Secondary | ICD-10-CM | POA: Diagnosis not present

## 2020-09-25 DIAGNOSIS — R270 Ataxia, unspecified: Secondary | ICD-10-CM

## 2020-09-25 DIAGNOSIS — F419 Anxiety disorder, unspecified: Secondary | ICD-10-CM | POA: Diagnosis not present

## 2020-09-25 DIAGNOSIS — R053 Chronic cough: Secondary | ICD-10-CM | POA: Diagnosis not present

## 2020-09-25 DIAGNOSIS — E7801 Familial hypercholesterolemia: Secondary | ICD-10-CM

## 2020-09-25 DIAGNOSIS — E1169 Type 2 diabetes mellitus with other specified complication: Secondary | ICD-10-CM | POA: Diagnosis not present

## 2020-09-25 DIAGNOSIS — R809 Proteinuria, unspecified: Secondary | ICD-10-CM | POA: Diagnosis not present

## 2020-09-25 DIAGNOSIS — E1129 Type 2 diabetes mellitus with other diabetic kidney complication: Secondary | ICD-10-CM | POA: Diagnosis not present

## 2020-09-25 DIAGNOSIS — R69 Illness, unspecified: Secondary | ICD-10-CM | POA: Diagnosis not present

## 2020-09-25 LAB — POCT GLYCOSYLATED HEMOGLOBIN (HGB A1C): Hemoglobin A1C: 6.9 % — AB (ref 4.0–5.6)

## 2020-09-25 MED ORDER — BENZONATATE 100 MG PO CAPS: ORAL_CAPSULE | ORAL | 5 refills | Status: DC

## 2020-09-25 MED ORDER — REPATHA SURECLICK 140 MG/ML ~~LOC~~ SOAJ: 140.0000 mg | SUBCUTANEOUS | 5 refills | Status: DC

## 2020-09-25 MED ORDER — CLONAZEPAM 0.5 MG PO TABS: 0.5000 mg | ORAL_TABLET | Freq: Two times a day (BID) | ORAL | 3 refills | Status: DC | PRN

## 2020-09-25 MED ORDER — ASPIRIN 81 MG PO TBEC: 81.0000 mg | DELAYED_RELEASE_TABLET | Freq: Every day | ORAL | Status: DC

## 2020-09-25 NOTE — Patient Instructions (Addendum)
Please review the attached list of medications and notify my office if there are any errors.   Start taking 81mg  aspirin every day  You can take OTC Delsym as needed to help with cough so you don't have to take the Tessalon so much

## 2020-09-25 NOTE — Progress Notes (Signed)
Established patient visit   Patient: Yvette Riley   DOB: 08/12/1955   65 y.o. Female  MRN: [id number] Visit Date: 09/25/2020  Today's healthcare provider: Lelon Huh, MD   Chief Complaint  Patient presents with   Diabetes   Subjective  -------------------------------------------------------------------------------------------------------------------- HPI  Diabetes Mellitus Type II, Follow-up  Lab Results  Component Value Date   HGBA1C 6.3 (A) 05/09/2020   HGBA1C 6.4 (H) 09/07/2019   HGBA1C 6.3 (A) 02/24/2019   Wt Readings from Last 3 Encounters:  06/19/20 183 lb 9.6 oz (83.3 kg)  05/09/20 179 lb (81.2 kg)  03/23/20 174 lb 8 oz (79.2 kg)   Last seen for diabetes 4 months ago.  Management since then includes no changes. She reports excellent compliance with treatment. She is not having side effects.  Symptoms: No fatigue No foot ulcerations  No appetite changes No nausea  No paresthesia of the feet  No polydipsia  No polyuria Yes visual disturbances   No vomiting     Home blood sugar records: fasting range: 200's  Episodes of hypoglycemia? No    Current insulin regiment: None  Current exercise: walking Current diet habits: in general, a "healthy" diet    Lipid/Cholesterol, Follow-up  Last lipid panel Other pertinent labs  Lab Results  Component Value Date   CHOL 228 (H) 06/19/2020   HDL 35 (L) 06/19/2020   LDLCALC 82 06/19/2020   LDLDIRECT 110 (H) 06/02/2018   TRIG 689 (HH) 06/19/2020   CHOLHDL 6.5 (H) 06/19/2020   Lab Results  Component Value Date   ALT 11 06/19/2020   AST 27 06/19/2020   PLT 326 06/19/2020   TSH 2.540 09/07/2019     She was last seen for this 4 months ago.  Management since that visit includes starting fenofibrate 117m daily .  She reports excellent compliance with treatment. She is not having side effects.   Symptoms: No chest pain No chest pressure/discomfort  No dyspnea Yes lower extremity edema  Yes  numbness or tingling of extremity No orthopnea  No palpitations No paroxysmal nocturnal dyspnea  No speech difficulty No syncope   Current diet: in general, a "healthy" diet   Current exercise: walking  The ASCVD Risk score (GLatexo, et al., 2013) failed to calculate for the following reasons:   The patient has a prior MI or stroke diagnosis  ---------------------------------------------------------------------------------------------------  She continues to have trouble with her balance and noted to have additional posterior infarcts on MRI from 07/2020. Has since seen Dr. SManuella Ghaziwho recommended home physical therapy. She has trouble getting up and down and using walking and is interested in getting a transfer chair.    Medications: Outpatient Medications Prior to Visit  Medication Sig   albuterol (VENTOLIN HFA) 108 (90 Base) MCG/ACT inhaler Inhale 2 puffs into the lungs every 6 (six) hours as needed for wheezing or shortness of breath.   ANORO ELLIPTA 62.5-25 MCG/INH AEPB Inhale 1 puff into the lungs daily.   benzonatate (TESSALON) 100 MG capsule Take 1 capsule by mouth three times daily as needed for cough   Blood Glucose Monitoring Suppl (ONE TOUCH ULTRA 2) w/Device KIT Use to check sugar daily for type 2 diabetes E11.9   clonazePAM (KLONOPIN) 0.5 MG tablet Take 1 tablet (0.5 mg total) by mouth 2 (two) times daily as needed.   clopidogrel (PLAVIX) 75 MG tablet Take 1 tablet (75 mg total) by mouth daily.   ezetimibe (ZETIA) 10 MG tablet Take 1  tablet by mouth once daily   fenofibrate 160 MG tablet Take 1 tablet (160 mg total) by mouth daily.   gabapentin (NEURONTIN) 600 MG tablet Take one tablet up to 4 times a day   glucose blood (ONETOUCH ULTRA) test strip Use to check blood sugar daily for type 2 diabetes E11.9   Iron, Ferrous Sulfate, 325 (65 Fe) MG TABS Take 325 mg by mouth daily.   Lancets (ONETOUCH ULTRASOFT) lancets Use to check sugar daily for type 2 diabetes E11.9    lisinopril (ZESTRIL) 10 MG tablet Take 10 mg by mouth daily.   meloxicam (MOBIC) 15 MG tablet TAKE 1 TABLET BY MOUTH ONCE DAILY AS NEEDED FOR PAIN   metFORMIN (GLUCOPHAGE-XR) 500 MG 24 hr tablet Take 1 tablet (500 mg total) by mouth 2 (two) times daily with a meal.   metoprolol succinate (TOPROL-XL) 25 MG 24 hr tablet Take 1 tablet (25 mg total) by mouth daily.   montelukast (SINGULAIR) 10 MG tablet Take 1 tablet (10 mg total) by mouth at bedtime.   NARCAN 4 MG/0.1ML LIQD nasal spray kit Place 0.4 mg into the nose once.    ondansetron (ZOFRAN ODT) 4 MG disintegrating tablet Take 1 tablet (4 mg total) by mouth every 8 (eight) hours as needed for nausea or vomiting.   oxyCODONE-acetaminophen (PERCOCET) 10-325 MG tablet One tablet every 4-6 hours as needed.   oxyCODONE-acetaminophen (PERCOCET) 10-325 MG tablet One tablet every 4-6 hours as needed.   sertraline (ZOLOFT) 50 MG tablet Take 1 tablet (50 mg total) by mouth daily.   sucralfate (CARAFATE) 1 g tablet TAKE 1 TABLET BY MOUTH 4 TIMES DAILY (WITH MEALS AND AT BEDTIME)   Tiotropium Bromide-Olodaterol 2.5-2.5 MCG/ACT AERS Inhale 2 puffs into the lungs daily.   zaleplon (SONATA) 10 MG capsule TAKE 1 CAPSULE BY MOUTH AT BEDTIME. MAY TAKE SECOND CAPSULE AFTER 2 HOURS IF NEEDED   No facility-administered medications prior to visit.    Review of Systems  Constitutional: Negative.   Respiratory: Negative.    Cardiovascular:  Positive for leg swelling. Negative for chest pain and palpitations.  Gastrointestinal:  Positive for diarrhea. Negative for abdominal distention, abdominal pain, anal bleeding, blood in stool, constipation, nausea, rectal pain and vomiting.  Neurological:  Positive for weakness, light-headedness and numbness (RIght sided due to CVA). Negative for dizziness and headaches.      Objective  -------------------------------------------------------------------------------------------------------------------- There were no vitals  taken for this visit.  Results for orders placed or performed in visit on 09/25/20  POCT glycosylated hemoglobin (Hb A1C)  Result Value Ref Range   Hemoglobin A1C 6.9 (A) 4.0 - 5.6 %    Assessment & Plan  ---------------------------------------------------------------------------------------------------------------------- 1. Type 2 diabetes mellitus with microalbuminuria, without long-term current use of insulin (HCC) Well controlled.  Continue current medications.    2. 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 Manuella Ghazi.   3. History of CVA in adulthood  - aspirin 81 MG EC tablet; Take 1 tablet (81 mg total) by mouth daily. Swallow whole. - Ambulatory referral to [place] (REPATHA SURECLICK) 585 MG/ML SOAJ; Inject 140 mg into the skin every 14 (fourteen) days.  Dispense: 2 mL; Refill: 5  4. Anxiety refill - clonazePAM (KLONOPIN) 0.5 MG tablet; Take 1 tablet (0.5 mg total) by mouth 2 (two) times daily as needed.  Dispense: 60 tablet; Refill: 3  5. Hyperlipidemia associated with type 2 diabetes mellitus ([place]) Intolerant to multiple statins. Is high risk for recurrent CVAs. Will  work on getting coverage for - Evolocumab (REPATHA SURECLICK) 888 MG/ML SOAJ; Inject 140 mg into the skin every 14 (fourteen) days.  Dispense: 2 mL; Refill: 5  6. Familial hypercholesteremia  - Evolocumab (REPATHA SURECLICK) 916 MG/ML SOAJ; Inject 140 mg into the skin every 14 (fourteen) days.  Dispense: 2 mL; Refill: 5  7. Chronic cough Refill  - benzonatate (TESSALON) 100 MG capsule; Take 1 capsule by mouth three times daily as needed for cough  Dispense: 60 capsule; Refill: 5       The entirety of the information documented in the History of Present Illness, Review of Systems and Physical Exam were personally obtained by me. Portions of this information were initially documented by the CMA and reviewed by me for thoroughness and accuracy.     Lelon Huh,  MD  [facility] [phone] (phone) [phone] (fax)  [place]

## 2020-09-29 ENCOUNTER — Emergency Department
Admission: EM | Admit: 2020-09-29 | Discharge: 2020-09-29 | Disposition: A | Discharge status: Home / Self Care | Payer: Medicare HMO | Attending: Emergency Medicine | Admitting: Emergency Medicine

## 2020-09-29 ENCOUNTER — Other Ambulatory Visit: Payer: Self-pay

## 2020-09-29 ENCOUNTER — Encounter: Payer: Self-pay | Admitting: Emergency Medicine

## 2020-09-29 ENCOUNTER — Emergency Department: Payer: Medicare HMO

## 2020-09-29 DIAGNOSIS — Z7902 Long term (current) use of antithrombotics/antiplatelets: Secondary | ICD-10-CM | POA: Insufficient documentation

## 2020-09-29 DIAGNOSIS — M47812 Spondylosis without myelopathy or radiculopathy, cervical region: Secondary | ICD-10-CM | POA: Diagnosis not present

## 2020-09-29 DIAGNOSIS — G9389 Other specified disorders of brain: Secondary | ICD-10-CM | POA: Diagnosis not present

## 2020-09-29 DIAGNOSIS — Z79899 Other long term (current) drug therapy: Secondary | ICD-10-CM | POA: Insufficient documentation

## 2020-09-29 DIAGNOSIS — S0990XA Unspecified injury of head, initial encounter: Secondary | ICD-10-CM | POA: Diagnosis not present

## 2020-09-29 DIAGNOSIS — R918 Other nonspecific abnormal finding of lung field: Secondary | ICD-10-CM | POA: Diagnosis not present

## 2020-09-29 DIAGNOSIS — M7918 Myalgia, other site: Secondary | ICD-10-CM

## 2020-09-29 DIAGNOSIS — Z85118 Personal history of other malignant neoplasm of bronchus and lung: Secondary | ICD-10-CM | POA: Insufficient documentation

## 2020-09-29 DIAGNOSIS — I639 Cerebral infarction, unspecified: Secondary | ICD-10-CM | POA: Diagnosis not present

## 2020-09-29 DIAGNOSIS — Z7982 Long term (current) use of aspirin: Secondary | ICD-10-CM | POA: Insufficient documentation

## 2020-09-29 DIAGNOSIS — M79601 Pain in right arm: Secondary | ICD-10-CM | POA: Diagnosis not present

## 2020-09-29 DIAGNOSIS — I10 Essential (primary) hypertension: Secondary | ICD-10-CM | POA: Diagnosis not present

## 2020-09-29 DIAGNOSIS — J45909 Unspecified asthma, uncomplicated: Secondary | ICD-10-CM | POA: Diagnosis not present

## 2020-09-29 DIAGNOSIS — J449 Chronic obstructive pulmonary disease, unspecified: Secondary | ICD-10-CM | POA: Diagnosis not present

## 2020-09-29 DIAGNOSIS — W101XXA Fall (on)(from) sidewalk curb, initial encounter: Secondary | ICD-10-CM | POA: Insufficient documentation

## 2020-09-29 DIAGNOSIS — R0789 Other chest pain: Secondary | ICD-10-CM | POA: Insufficient documentation

## 2020-09-29 DIAGNOSIS — M25551 Pain in right hip: Secondary | ICD-10-CM | POA: Insufficient documentation

## 2020-09-29 DIAGNOSIS — S79911A Unspecified injury of right hip, initial encounter: Secondary | ICD-10-CM | POA: Diagnosis not present

## 2020-09-29 DIAGNOSIS — Z87891 Personal history of nicotine dependence: Secondary | ICD-10-CM | POA: Diagnosis not present

## 2020-09-29 DIAGNOSIS — G319 Degenerative disease of nervous system, unspecified: Secondary | ICD-10-CM | POA: Diagnosis not present

## 2020-09-29 DIAGNOSIS — S199XXA Unspecified injury of neck, initial encounter: Secondary | ICD-10-CM | POA: Diagnosis not present

## 2020-09-29 DIAGNOSIS — J984 Other disorders of lung: Secondary | ICD-10-CM | POA: Diagnosis not present

## 2020-09-29 DIAGNOSIS — M40202 Unspecified kyphosis, cervical region: Secondary | ICD-10-CM | POA: Diagnosis not present

## 2020-09-29 NOTE — ED Triage Notes (Signed)
Patient presents ambulatory with walker stating yesterday morning she tripped on a curb and landed on her chest and right arm on concrete. States she hit right side of head on ground. On plavix. Speaking in full sentences. C/o right sided pain from neck down to hip.

## 2020-09-29 NOTE — ED Provider Notes (Signed)
[facility] Emergency Department Provider Note ____________________________________________  Time seen: Approximately 6:39 PM  I have reviewed the triage vital signs and the nursing notes.   HISTORY  Chief Complaint Fall    HPI Yvette Riley is a 65 y.o. female who presents to the emergency department for evaluation and treatment of chest wall pain, right arm pain and right hip pain after mechanical, nonsyncopal fall yesterday.  She tripped over the curb and landed on concrete.  She is on Plavix.  She takes oxycodone 2 times per day which has controlled her pain.  Past Medical History:  Diagnosis Date   Allergy    Anemia    Anxiety    Arthritis    Asthma    Blood transfusion without reported diagnosis    C. difficile diarrhea 04/07/2016   CAP (community acquired pneumonia) 03/21/2015   Combined forms of age-related cataract of both eyes 05/06/2016   COPD (chronic obstructive pulmonary disease) (HCC)    CVA (cerebral vascular accident) ([place]) 06/17/2018   Diabetes mellitus without complication ([place])    Displacement of lumbar intervertebral disc without myelopathy    Emphysema of lung (HCC)    GERD (gastroesophageal reflux disease)    Glaucoma    History of chicken pox    Hyperlipidemia    Hypertension    Pneumonia 03/29/2015   Sepsis ([place]) 03/17/2016   Shortness of breath    Small cell lung cancer [facility])     Patient Active Problem List   Diagnosis Date Noted   Ataxia due to and not concurrent with ischemic cerebrovascular accident (CVA) 05/18/2019   History of COPD 05/18/2019   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   Chronic, continuous use of opioids 08/05/2018   Cerebellar infarct ([place]) 06/17/2018   Chronic cough 06/02/2018   Anxiety 07/19/2016   Small cell lung cancer, right ([place]) 05/13/2016   Bilateral presbyopia 05/06/2016   Combined forms of age-related cataract of both eyes 05/06/2016   Dry eye syndrome of both lacrimal glands  05/06/2016   Chemoprophylaxis 04/29/2016   Severe obesity (BMI 35.0-39.9) with comorbidity ([place]) 04/16/2016   Weakness generalized 04/06/2016   Mass of upper lobe of right lung 03/01/2016   Coronary atherosclerosis 02/29/2016   Allergic rhinitis 09/27/2014   Asthma 09/27/2014   Anemia 09/27/2014   Hyperglyceridemia, pure 09/27/2014   Glaucoma 09/27/2014   Hip pain 09/27/2014   Right knee pain 09/27/2014   Displacement of lumbar intervertebral disc without myelopathy 09/27/2014   Thoracic back pain 09/27/2014   Insomnia 08/11/2014   COPD (chronic obstructive pulmonary disease) ([place]) 06/12/2014   Type 2 diabetes mellitus ([place]) 06/12/2014   Back pain 06/12/2014   Smoking greater than 30 pack years 03/28/2014    Past Surgical History:  Procedure Laterality Date   CESAREAN SECTION     TUBAL LIGATION     UPPER GI ENDOSCOPY      Prior to Admission medications   Medication Sig Start Date End Date Taking? Authorizing Provider  albuterol (VENTOLIN HFA) 108 (90 Base) MCG/ACT inhaler Inhale 2 puffs into the lungs every 6 (six) hours as needed for wheezing or shortness of breath. 09/08/19   Birdie Sons, MD  ANORO ELLIPTA 62.5-25 MCG/INH AEPB Inhale 1 puff into the lungs daily. 08/08/20   Birdie Sons, MD  aspirin 81 MG EC tablet Take 1 tablet (81 mg total) by mouth daily. Swallow whole. 09/25/20   Birdie Sons, MD  benzonatate (TESSALON) 100 MG capsule Take 1  capsule by mouth three times daily as needed for cough 09/25/20   Fisher, Donald E, MD  Blood Glucose Monitoring Suppl (ONE TOUCH ULTRA 2) w/Device KIT Use to check sugar daily for type 2 diabetes E11.9 05/31/20   Fisher, Donald E, MD  clonazePAM (KLONOPIN) 0.5 MG tablet Take 1 tablet (0.5 mg total) by mouth 2 (two) times daily as needed. 09/25/20   Fisher, Donald E, MD  clopidogrel (PLAVIX) 75 MG tablet Take 1 tablet (75 mg total) by mouth daily. 08/28/20   Fisher, Donald E, MD  Evolocumab (REPATHA SURECLICK) 140 MG/ML SOAJ Inject  140 mg into the skin every 14 (fourteen) days. 09/25/20   Fisher, Donald E, MD  ezetimibe (ZETIA) 10 MG tablet Take 1 tablet by mouth once daily 08/25/19   Fisher, Donald E, MD  fenofibrate 160 MG tablet Take 1 tablet (160 mg total) by mouth daily. 06/21/20   Fisher, Donald E, MD  gabapentin (NEURONTIN) 600 MG tablet Take one tablet up to 4 times a day 08/08/20   Fisher, Donald E, MD  glucose blood (ONETOUCH ULTRA) test strip Use to check blood sugar daily for type 2 diabetes E11.9 05/31/20   Fisher, Donald E, MD  Iron, Ferrous Sulfate, 325 (65 Fe) MG TABS Take 325 mg by mouth daily. 07/04/20   Fisher, Donald E, MD  Lancets (ONETOUCH ULTRASOFT) lancets Use to check sugar daily for type 2 diabetes E11.9 05/31/20   Fisher, Donald E, MD  lisinopril (ZESTRIL) 10 MG tablet Take 10 mg by mouth daily. 12/11/18   [provider]  meloxicam (MOBIC) 15 MG tablet TAKE 1 TABLET BY MOUTH ONCE DAILY AS NEEDED FOR PAIN 04/27/18   Fisher, Donald E, MD  metFORMIN (GLUCOPHAGE-XR) 500 MG 24 hr tablet Take 1 tablet (500 mg total) by mouth 2 (two) times daily with a meal. 04/03/20   Fisher, Donald E, MD  metoprolol succinate (TOPROL-XL) 25 MG 24 hr tablet Take 1 tablet (25 mg total) by mouth daily. 08/06/19   Fisher, Donald E, MD  montelukast (SINGULAIR) 10 MG tablet Take 1 tablet (10 mg total) by mouth at bedtime. 10/03/17   Fisher, Donald E, MD  NARCAN 4 MG/0.1ML LIQD nasal spray kit Place 0.4 mg into the nose once.  07/07/17   [provider]  ondansetron (ZOFRAN ODT) 4 MG disintegrating tablet Take 1 tablet (4 mg total) by mouth every 8 (eight) hours as needed for nausea or vomiting. 03/16/19   Williams, Jonathan E, MD  oxyCODONE-acetaminophen (PERCOCET) 10-325 MG tablet One tablet every 4-6 hours as needed. 08/08/20   Fisher, Donald E, MD  oxyCODONE-acetaminophen (PERCOCET) 10-325 MG tablet One tablet every 4-6 hours as needed. 08/08/20   Fisher, Donald E, MD  sertraline (ZOLOFT) 50 MG tablet Take 1 tablet (50 mg  total) by mouth daily. 05/09/20   Fisher, Donald E, MD  sucralfate (CARAFATE) 1 g tablet TAKE 1 TABLET BY MOUTH 4 TIMES DAILY (WITH MEALS AND AT BEDTIME) 03/31/20   Fisher, Donald E, MD  Tiotropium Bromide-Olodaterol 2.5-2.5 MCG/ACT AERS Inhale 2 puffs into the lungs daily. 06/30/20   Fisher, Donald E, MD  zaleplon (SONATA) 10 MG capsule TAKE 1 CAPSULE BY MOUTH AT BEDTIME. MAY TAKE SECOND CAPSULE AFTER 2 HOURS IF NEEDED 06/19/20   Fisher, Donald E, MD    Allergies Codeine, Other, Penicillins, Yellow jacket venom [bee venom], Crestor [rosuvastatin], Gemfibrozil, Trazodone and nefazodone, and Lipitor [atorvastatin]  Family History  Problem Relation Age of Onset   Heart failure Mother      Heart disease Mother    Stroke Mother    Heart disease Brother    COPD Brother    Cancer Maternal Aunt        Breast Cancer    Social History Social History   Tobacco Use   Smoking status: Former    Packs/day: 0.25    Years: 45.00    Pack years: 11.25    Types: Cigarettes    Quit date: 03/17/2016    Years since quitting: 4.5   Smokeless tobacco: Never   Tobacco comments:    pt states she quit in 2016-2017  Vaping Use   Vaping Use: Never used  Substance Use Topics   Alcohol use: No    Alcohol/week: 0.0 standard drinks   Drug use: No    Review of Systems Constitutional: Negative for fever. Cardiovascular: Negative for chest pain. Respiratory: Negative for shortness of breath. Musculoskeletal: Positive for right upper extremity, right hip pain, and right chest wall pain Skin: Negative for open wounds Neurological: Negative for decrease in sensation  ____________________________________________   PHYSICAL EXAM:  VITAL SIGNS: ED Triage Vitals  Enc Vitals Group     BP 09/29/20 1543 130/81     Pulse Rate 09/29/20 1543 (!) 103     Resp 09/29/20 1543 20     Temp 09/29/20 1543 99 F (37.2 C)     Temp Source 09/29/20 1543 Oral     SpO2 09/29/20 1543 96 %     Weight 09/29/20 1545 190 lb  (86.2 kg)     Height 09/29/20 1545 4' 10" (1.473 m)     Head Circumference --      Peak Flow --      Pain Score 09/29/20 1544 9     Pain Loc --      Pain Edu? --      Excl. in GC? --     Constitutional: Alert and oriented. Well appearing and in no acute distress. Eyes: Conjunctivae are clear without discharge or drainage Head: Atraumatic Neck: Supple.  No focal midline tenderness Respiratory: No cough. Respirations are even and unlabored. Musculoskeletal: Able to demonstrate range of motion of all 4 extremities. Neurologic: Awake, alert, oriented.  Motor and sensory function is intact. Skin: No open wounds or lesions. Psychiatric: Affect and behavior are appropriate.  ____________________________________________   LABS (all labs ordered are listed, but only abnormal results are displayed)  Labs Reviewed - No data to display ____________________________________________  RADIOLOGY  CT head, cervical spine negative for acute findings.  Image of the right ribs and chest negative for acute concerns.  Image of the right hip negative for bony abnormality.  I, Lovada Barwick, personally viewed and evaluated these images (plain radiographs) as part of my medical decision making, as well as reviewing the written report by the radiologist.  No results found. ____________________________________________   PROCEDURES  Procedures  ____________________________________________   INITIAL IMPRESSION / ASSESSMENT AND PLAN / ED COURSE  Yvette Riley is a 64 y.o. who presents to the emergency department for treatment and evaluation after mechanical, nonsyncopal fall that happened yesterday.  See HPI for further details.  Exam is reassuring.  CT scans and diagnostic images are all negative for acute concerns.  Patient encouraged to follow-up with her primary care provider if symptoms do not improve over the week.  She will continue taking her prescribed pain medication and was  advised not to increase her dosages or take more often than prescribed.  Medications - No data to   display  Pertinent labs & imaging results that were available during my care of the patient were reviewed by me and considered in my medical decision making (see chart for details).   _________________________________________   FINAL CLINICAL IMPRESSION(S) / ED DIAGNOSES  Final diagnoses:  Minor head injury, initial encounter  Musculoskeletal pain    ED Discharge Orders     None        If controlled substance prescribed during this visit, 12 month history viewed on the [place] prior to issuing an initial prescription for Schedule II or III opiod.    Victorino Dike, FNP 09/29/20 1844    [place]onia Drafts, MD 09/29/20 [phone]

## 2020-09-29 NOTE — Discharge Instructions (Addendum)
Follow-up with your primary care provider or return to the emergency department for symptoms of concern.

## 2020-10-09 ENCOUNTER — Other Ambulatory Visit: Payer: Self-pay | Admitting: Family Medicine

## 2020-10-09 DIAGNOSIS — M5441 Lumbago with sciatica, right side: Secondary | ICD-10-CM

## 2020-10-09 DIAGNOSIS — G8929 Other chronic pain: Secondary | ICD-10-CM

## 2020-10-09 NOTE — Telephone Encounter (Signed)
Medication Refill - Medication: oxyCODONE-acetaminophen (PERCOCET) 10-325 MG tablet   Has the patient contacted their pharmacy? No.  Preferred Pharmacy (with phone number or street name):  [place] (N), [place] [address] [place] [address]  Phone:  [phone]  Fax:  [phone]   Agent: Please be advised that RX refills may take up to 3 business days. We ask that you follow-up with your pharmacy.

## 2020-10-09 NOTE — Telephone Encounter (Signed)
Pt called back and wants to know the status of her refill request.

## 2020-10-09 NOTE — Telephone Encounter (Signed)
Pt called back to report that she only has three pills left. Wanted PCP to know

## 2020-10-09 NOTE — Telephone Encounter (Signed)
Pt called back again asking about the status of her refill request, please advise.

## 2020-10-09 NOTE — Telephone Encounter (Signed)
Last refill: 08/08/2020, #180, with no refills  Last office visit: 09/25/2020 Next office visit: 01/26/2021

## 2020-10-10 ENCOUNTER — Telehealth: Payer: Self-pay

## 2020-10-10 MED ORDER — OXYCODONE-ACETAMINOPHEN 10-325 MG PO TABS: ORAL_TABLET | ORAL | 0 refills | Status: DC

## 2020-10-10 MED ORDER — MELOXICAM 15 MG PO TABS: ORAL_TABLET | ORAL | 3 refills | Status: DC

## 2020-10-10 NOTE — Telephone Encounter (Signed)
Copied from [place] [id number]. Topic: General - Other >> Oct 10, 2020 10:02 AM Tessa Lerner A wrote: Reason for CRM: Patient would like to be contacted regarding their prescription for oxyCODONE-acetaminophen (PERCOCET) 10-325 MG tablet [[id number]]   The patient has questions related to the denial/delay of their medication   Please contact further when available

## 2020-10-10 NOTE — Addendum Note (Signed)
Addended by: Lelon Huh E on: 10/10/2020 12:09 PM   Modules accepted: Orders

## 2020-11-01 ENCOUNTER — Other Ambulatory Visit: Payer: Self-pay | Admitting: Family Medicine

## 2020-11-01 DIAGNOSIS — M5441 Lumbago with sciatica, right side: Secondary | ICD-10-CM

## 2020-11-01 DIAGNOSIS — G8929 Other chronic pain: Secondary | ICD-10-CM

## 2020-11-01 NOTE — Telephone Encounter (Signed)
Medication Refill - Medication: oxyCODONE-acetaminophen (PERCOCET) 10-325 MG   Has the patient contacted their pharmacy? yes (Agent: If no, request that the patient contact the pharmacy for the refill.) (Agent: If yes, when and what did the pharmacy advise?)contact pcp  Preferred Pharmacy (with phone number or street name): [place] [facility]), [place] - [address]  Phone:  [phone] Fax:  [phone] Has the patient been seen for an appointment in the last year OR does the patient have an upcoming appointment? yes  Agent: Please be advised that RX refills may take up to 3 business days. We ask that you follow-up with your pharmacy.

## 2020-11-01 NOTE — Telephone Encounter (Signed)
Requested medication (s) are due for refill today: DUe 11/09/20  Requested medication (s) are on the active medication list: yes  Last refill: 10/10/20 #180 0 refills  Future visit scheduled  yes 01/26/21  Notes to clinic: not delegated  Requested Prescriptions  Pending Prescriptions Disp Refills   oxyCODONE-acetaminophen (PERCOCET) 10-325 MG tablet 180 tablet 0    Sig: One tablet every 4-6 hours as needed.     Not Delegated - Analgesics:  Opioid Agonist Combinations Failed - 11/01/2020 12:28 PM      Failed - This refill cannot be delegated      Failed - Urine Drug Screen completed in last 360 days      Passed - Valid encounter within last 6 months    Recent Outpatient Visits           1 month ago Type 2 diabetes mellitus with microalbuminuria, without long-term current use of insulin [facility])   [facility] Birdie Sons, MD   4 months ago [place], Donald E, MD   5 months ago History of CVA in adulthood   [place], Kirstie Peri, MD   9 months ago [place], Donald E, MD   1 year ago [place], Donald E, MD       Future Appointments             In 2 months Fisher, Kirstie Peri, MD [facility], [place]

## 2020-11-02 ENCOUNTER — Telehealth: Payer: Self-pay | Admitting: Family Medicine

## 2020-11-02 NOTE — Telephone Encounter (Signed)
Copied from [place] [id number]. Topic: Appointment Scheduling - Scheduling Inquiry for Clinic >> Nov 01, 2020 12:16 PM Bayard Beaver wrote: Reason for HHI:DUPBDHD called in needs to reschedule AWV. Please call back

## 2020-11-06 NOTE — Telephone Encounter (Signed)
Sent message to [place] to contact patient regarding rescheduling AWV.

## 2020-11-07 ENCOUNTER — Telehealth: Payer: Self-pay
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
## 2020-11-09 ENCOUNTER — Ambulatory Visit: Payer: Self-pay | Admitting: *Deleted

## 2020-11-09 ENCOUNTER — Other Ambulatory Visit: Payer: Self-pay | Admitting: Family Medicine

## 2020-11-09 DIAGNOSIS — G8929 Other chronic pain: Secondary | ICD-10-CM

## 2020-11-09 MED ORDER — OXYCODONE-ACETAMINOPHEN 10-325 MG PO TABS: ORAL_TABLET | ORAL | 0 refills | Status: DC

## 2020-11-09 NOTE — Telephone Encounter (Signed)
Patient checking on the status of her oxyCODONE-acetaminophen (PERCOCET) 10-325 MG tablet. Patient states she has been without since 11/07/2020.      [place] (N), [place] - [address] Phone:  [phone]  Fax:  [phone]
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
## 2020-11-12 ENCOUNTER — Other Ambulatory Visit: Payer: Self-pay | Admitting: Family Medicine

## 2020-11-12 DIAGNOSIS — E7801 Familial hypercholesterolemia: Secondary | ICD-10-CM

## 2020-11-12 DIAGNOSIS — I2510 Atherosclerotic heart disease of native coronary artery without angina pectoris: Secondary | ICD-10-CM

## 2020-11-12 DIAGNOSIS — E785 Hyperlipidemia, unspecified: Secondary | ICD-10-CM

## 2020-11-12 DIAGNOSIS — E1169 Type 2 diabetes mellitus with other specified complication: Secondary | ICD-10-CM

## 2020-11-12 MED ORDER — PRALUENT 150 MG/ML ~~LOC~~ SOAJ: 1.0000 mL | SUBCUTANEOUS | 12 refills | Status: DC

## 2020-11-12 NOTE — Progress Notes (Signed)
Attempted Prior Auth for Repatha. Praluent is the preferred formulary alternative.

## 2020-11-22 ENCOUNTER — Telehealth: Payer: Self-pay | Admitting: Family Medicine

## 2020-11-22 DIAGNOSIS — M5126 Other intervertebral disc displacement, lumbar region: Secondary | ICD-10-CM

## 2020-11-22 NOTE — Telephone Encounter (Signed)
Pt called and stated that she would like an early refill on Gabapentin. Pt states that her son passed away in 10-22-22 and has not been doing well. She states that she has take more gabapentin than normal. Pt would like a call from the provider if possible. Please advise

## 2020-11-22 NOTE — Telephone Encounter (Signed)
Pt called back to follow on her previous request regarding having her Gabapentin filled early.

## 2020-11-24 MED ORDER — GABAPENTIN 600 MG PO TABS: ORAL_TABLET | ORAL | 1 refills | Status: DC

## 2020-11-24 NOTE — Telephone Encounter (Signed)
Last refill 08/08/2020 qty 360 with 1 refill

## 2020-12-06 ENCOUNTER — Other Ambulatory Visit: Payer: Self-pay | Admitting: Family Medicine

## 2020-12-06 DIAGNOSIS — G8929 Other chronic pain: Secondary | ICD-10-CM

## 2020-12-06 DIAGNOSIS — M5442 Lumbago with sciatica, left side: Secondary | ICD-10-CM

## 2020-12-06 DIAGNOSIS — J4530 Mild persistent asthma, uncomplicated: Secondary | ICD-10-CM

## 2020-12-06 NOTE — Telephone Encounter (Signed)
Copied from [place] [id number]. Topic: Quick Communication - Rx Refill/Question >> Dec 06, 2020  2:13 PM [name] wrote: Medication: oxyCODONE-acetaminophen (PERCOCET) 10-325 MG tablet, albuterol (VENTOLIN HFA) 108 (90 Base) MCG/ACT inhaler  Has the patient contacted their pharmacy? Yes.  Need new Rx  (Agent: If no, request that the patient contact the pharmacy for the refill. If patient does not wish to contact the pharmacy document the reason why and proceed with request.) (Agent: If yes, when and what did the pharmacy advise?)  Preferred Pharmacy (with phone number or street name): [place]), [place] - [address]  Phone:  [phone] Fax:  [phone]    Has the patient been seen for an appointment in the last year OR does the patient have an upcoming appointment? Yes.    Agent: Please be advised that RX refills may take up to 3 business days. We ask that you follow-up with your pharmacy.

## 2020-12-06 NOTE — Telephone Encounter (Signed)
This is not a [facility] patient. Please route to correct office.

## 2020-12-07 MED ORDER — ALBUTEROL SULFATE HFA 108 (90 BASE) MCG/ACT IN AERS: 2.0000 | INHALATION_SPRAY | Freq: Four times a day (QID) | RESPIRATORY_TRACT | 3 refills | Status: DC | PRN

## 2020-12-07 MED ORDER — OXYCODONE-ACETAMINOPHEN 10-325 MG PO TABS: ORAL_TABLET | ORAL | 0 refills | Status: DC

## 2020-12-07 NOTE — Telephone Encounter (Signed)
Requested medication (s) are due for refill today - yes  Requested medication (s) are on the active medication list -yes  Future visit scheduled -yes  Last refill: Albuterol 09/08/19 18g #3                 Oxycodone 11/09/20 #180  Notes to clinic: Request RF: expired Rx, non delegated Rx  Requested Prescriptions  Pending Prescriptions Disp Refills   albuterol (VENTOLIN HFA) 108 (90 Base) MCG/ACT inhaler 18 g 3    Sig: Inhale 2 puffs into the lungs every 6 (six) hours as needed for wheezing or shortness of breath.     Pulmonology:  Beta Agonists Failed - 12/07/2020  8:30 AM      Failed - One inhaler should last at least one month. If the patient is requesting refills earlier, contact the patient to check for uncontrolled symptoms.      Passed - Valid encounter within last 12 months    Recent Outpatient Visits           2 months ago Type 2 diabetes mellitus with microalbuminuria, without long-term current use of insulin [facility])   [facility] Birdie Sons, MD   5 months ago [place], Donald E, MD   7 months ago History of CVA in adulthood   [place], Kirstie Peri, MD   11 months ago [place], Donald E, MD   1 year ago [place], Donald E, MD       Future Appointments             In 1 month Fisher, Kirstie Peri, MD [facility]             oxyCODONE-acetaminophen (PERCOCET) 10-325 MG tablet 180 tablet 0    Sig: One tablet every 4-6 hours as needed.     Not Delegated - Analgesics:  Opioid Agonist Combinations Failed - 12/07/2020  8:30 AM      Failed - This refill cannot be delegated      Failed - Urine Drug Screen completed in last 360 days      Passed - Valid encounter within last 6 months    Recent Outpatient Visits           2 months ago Type 2 diabetes mellitus with microalbuminuria, without  long-term current use of insulin [facility])   [facility] Birdie Sons, MD   5 months ago [place], Donald E, MD   7 months ago History of CVA in adulthood   [place], Kirstie Peri, MD   11 months ago [place], Donald E, MD   1 year ago [place], Donald E, MD       Future Appointments             In 1 month Fisher, Kirstie Peri, MD [facility]               Requested Prescriptions  Pending Prescriptions Disp Refills   albuterol (VENTOLIN HFA) 108 (90 Base) MCG/ACT inhaler 18 g 3    Sig: Inhale 2 puffs into the lungs every 6 (six) hours as needed for wheezing or shortness of breath.     Pulmonology:  Beta Agonists Failed - 12/07/2020  8:30 AM  Failed - One inhaler should last at least one month. If the patient is requesting refills earlier, contact the patient to check for uncontrolled symptoms.      Passed - Valid encounter within last 12 months    Recent Outpatient Visits           2 months ago Type 2 diabetes mellitus with microalbuminuria, without long-term current use of insulin [facility])   [facility] Birdie Sons, MD   5 months ago [place], Donald E, MD   7 months ago History of CVA in adulthood   [place], Kirstie Peri, MD   11 months ago [place], Donald E, MD   1 year ago [place], Donald E, MD       Future Appointments             In 1 month Fisher, Kirstie Peri, MD [facility]             oxyCODONE-acetaminophen (PERCOCET) 10-325 MG tablet 180 tablet 0    Sig: One tablet every 4-6 hours as needed.     Not Delegated - Analgesics:  Opioid Agonist Combinations Failed - 12/07/2020  8:30 AM      Failed - This  refill cannot be delegated      Failed - Urine Drug Screen completed in last 360 days      Passed - Valid encounter within last 6 months    Recent Outpatient Visits           2 months ago Type 2 diabetes mellitus with microalbuminuria, without long-term current use of insulin [facility])   [facility] Birdie Sons, MD   5 months ago [place], Donald E, MD   7 months ago History of CVA in adulthood   [place], Kirstie Peri, MD   11 months ago [place], Donald E, MD   1 year ago [place], Donald E, MD       Future Appointments             In 1 month Fisher, Kirstie Peri, MD [facility], [place]

## 2020-12-07 NOTE — Telephone Encounter (Signed)
Copied from [place] [id number]. Topic: Quick Communication - Rx Refill/Question >> Dec 06, 2020  2:13 PM [name] wrote: Medication: oxyCODONE-acetaminophen (PERCOCET) 10-325 MG tablet, albuterol (VENTOLIN HFA) 108 (90 Base) MCG/ACT inhaler  Has the patient contacted their pharmacy? Yes.  Need new Rx  (Agent: If no, request that the patient contact the pharmacy for the refill. If patient does not wish to contact the pharmacy document the reason why and proceed with request.) (Agent: If yes, when and what did the pharmacy advise?)  Preferred Pharmacy (with phone number or street name): [place]), [place] - [address]  Phone:  [phone] Fax:  [phone]    Has the patient been seen for an appointment in the last year OR does the patient have an upcoming appointment? Yes.    Agent: Please be advised that RX refills may take up to 3 business days. We ask that you follow-up with your pharmacy.

## 2020-12-13 ENCOUNTER — Ambulatory Visit (INDEPENDENT_AMBULATORY_CARE_PROVIDER_SITE_OTHER): Payer: Medicare HMO

## 2020-12-13 DIAGNOSIS — F339 Major depressive disorder, recurrent, unspecified: Secondary | ICD-10-CM | POA: Diagnosis not present

## 2020-12-13 DIAGNOSIS — Z0000 Encounter for general adult medical examination without abnormal findings: Secondary | ICD-10-CM | POA: Diagnosis not present

## 2020-12-13 DIAGNOSIS — R69 Illness, unspecified: Secondary | ICD-10-CM | POA: Diagnosis not present

## 2020-12-13 NOTE — Patient Instructions (Signed)
[name] , Thank you for taking time to come for your Medicare Wellness Visit. I appreciate your ongoing commitment to your health goals. Please review the following plan we discussed and let me know if I can assist you in the future.   Screening recommendations/referrals: Colonoscopy: declined  Mammogram: overdue  Bone Density: overdue Recommended yearly ophthalmology/optometry visit for glaucoma screening and checkup Recommended yearly dental visit for hygiene and checkup  Vaccinations: Influenza vaccine: decined Pneumococcal vaccine: 02/24/14 Tdap vaccine: declined  Shingles vaccine: declined  Covid-19: 2 at [facility] per patient  Advanced directives: no declined   Conditions/risks identified:   Next appointment: Follow up in one year for your annual wellness visit.   Preventive Care 40-64 Years, Female Preventive care refers to lifestyle choices and visits with your health care provider that can promote health and wellness. What does preventive care include? A yearly physical exam. This is also called an annual well check. Dental exams once or twice a year. Routine eye exams. Ask your health care provider how often you should have your eyes checked. Personal lifestyle choices, including: Daily care of your teeth and gums. Regular physical activity. Eating a healthy diet. Avoiding tobacco and drug use. Limiting alcohol use. Practicing safe sex. Taking low-dose aspirin daily starting at age 66. Taking vitamin and mineral supplements as recommended by your health care provider. What happens during an annual well check? The services and screenings done by your health care provider during your annual well check will depend on your age, overall health, lifestyle risk factors, and family history of disease. Counseling  Your health care provider may ask you questions about your: Alcohol use. Tobacco use. Drug use. Emotional well-being. Home and relationship  well-being. Sexual activity. Eating habits. Work and work Statistician. Method of birth control. Menstrual cycle. Pregnancy history. Screening  You may have the following tests or measurements: Height, weight, and BMI. Blood pressure. Lipid and cholesterol levels. These may be checked every 5 years, or more frequently if you are over 13 years old. Skin check. Lung cancer screening. You may have this screening every year starting at age 25 if you have a 30-pack-year history of smoking and currently smoke or have quit within the past 15 years. Fecal occult blood test (FOBT) of the stool. You may have this test every year starting at age 82. Flexible sigmoidoscopy or colonoscopy. You may have a sigmoidoscopy every 5 years or a colonoscopy every 10 years starting at age 3. Hepatitis C blood test. Hepatitis B blood test. Sexually transmitted disease (STD) testing. Diabetes screening. This is done by checking your blood sugar (glucose) after you have not eaten for a while (fasting). You may have this done every 1-3 years. Mammogram. This may be done every 1-2 years. Talk to your health care provider about when you should start having regular mammograms. This may depend on whether you have a family history of breast cancer. BRCA-related cancer screening. This may be done if you have a family history of breast, ovarian, tubal, or peritoneal cancers. Pelvic exam and Pap test. This may be done every 3 years starting at age 48. Starting at age 59, this may be done every 5 years if you have a Pap test in combination with an HPV test. Bone density scan. This is done to screen for osteoporosis. You may have this scan if you are at high risk for osteoporosis. Discuss your test results, treatment options, and if necessary, the need for more tests with your health care  provider. Vaccines  Your health care provider may recommend certain vaccines, such as: Influenza vaccine. This is recommended every  year. Tetanus, diphtheria, and acellular pertussis (Tdap, Td) vaccine. You may need a Td booster every 10 years. Zoster vaccine. You may need this after age 64. Pneumococcal 13-valent conjugate (PCV13) vaccine. You may need this if you have certain conditions and were not previously vaccinated. Pneumococcal polysaccharide (PPSV23) vaccine. You may need one or two doses if you smoke cigarettes or if you have certain conditions. Talk to your health care provider about which screenings and vaccines you need and how often you need them. This information is not intended to replace advice given to you by your health care provider. Make sure you discuss any questions you have with your health care provider. Document Released: 02/10/2015 Document Revised: 10/04/2015 Document Reviewed: 11/15/2014 Elsevier Interactive Patient Education  2017 [place] Prevention in the Home Falls can cause injuries. They can happen to people of all ages. There are many things you can do to make your home safe and to help prevent falls. What can I do on the outside of my home? Regularly fix the edges of walkways and driveways and fix any cracks. Remove anything that might make you trip as you walk through a door, such as a raised step or threshold. Trim any bushes or trees on the path to your home. Use bright outdoor lighting. Clear any walking paths of anything that might make someone trip, such as rocks or tools. Regularly check to see if handrails are loose or broken. Make sure that both sides of any steps have handrails. Any raised decks and porches should have guardrails on the edges. Have any leaves, snow, or ice cleared regularly. Use sand or salt on walking paths during winter. Clean up any spills in your garage right away. This includes oil or grease spills. What can I do in the bathroom? Use night lights. Install grab bars by the toilet and in the tub and shower. Do not use towel bars as grab  bars. Use non-skid mats or decals in the tub or shower. If you need to sit down in the shower, use a plastic, non-slip stool. Keep the floor dry. Clean up any water that spills on the floor as soon as it happens. Remove soap buildup in the tub or shower regularly. Attach bath mats securely with double-sided non-slip rug tape. Do not have throw rugs and other things on the floor that can make you trip. What can I do in the bedroom? Use night lights. Make sure that you have a light by your bed that is easy to reach. Do not use any sheets or blankets that are too big for your bed. They should not hang down onto the floor. Have a firm chair that has side arms. You can use this for support while you get dressed. Do not have throw rugs and other things on the floor that can make you trip. What can I do in the kitchen? Clean up any spills right away. Avoid walking on wet floors. Keep items that you use a lot in easy-to-reach places. If you need to reach something above you, use a strong step stool that has a grab bar. Keep electrical cords out of the way. Do not use floor polish or wax that makes floors slippery. If you must use wax, use non-skid floor wax. Do not have throw rugs and other things on the floor that can make  you trip. What can I do with my stairs? Do not leave any items on the stairs. Make sure that there are handrails on both sides of the stairs and use them. Fix handrails that are broken or loose. Make sure that handrails are as long as the stairways. Check any carpeting to make sure that it is firmly attached to the stairs. Fix any carpet that is loose or worn. Avoid having throw rugs at the top or bottom of the stairs. If you do have throw rugs, attach them to the floor with carpet tape. Make sure that you have a light switch at the top of the stairs and the bottom of the stairs. If you do not have them, ask someone to add them for you. What else can I do to help prevent  falls? Wear shoes that: Do not have high heels. Have rubber bottoms. Are comfortable and fit you well. Are closed at the toe. Do not wear sandals. If you use a stepladder: Make sure that it is fully opened. Do not climb a closed stepladder. Make sure that both sides of the stepladder are locked into place. Ask someone to hold it for you, if possible. Clearly mark and make sure that you can see: Any grab bars or handrails. First and last steps. Where the edge of each step is. Use tools that help you move around (mobility aids) if they are needed. These include: Canes. Walkers. Scooters. Crutches. Turn on the lights when you go into a dark area. Replace any light bulbs as soon as they burn out. Set up your furniture so you have a clear path. Avoid moving your furniture around. If any of your floors are uneven, fix them. If there are any pets around you, be aware of where they are. Review your medicines with your doctor. Some medicines can make you feel dizzy. This can increase your chance of falling. Ask your doctor what other things that you can do to help prevent falls. This information is not intended to replace advice given to you by your health care provider. Make sure you discuss any questions you have with your health care provider. Document Released: 11/10/2008 Document Revised: 06/22/2015 Document Reviewed: 02/18/2014 Elsevier Interactive Patient Education  2017 [employer].

## 2020-12-13 NOTE — Progress Notes (Addendum)
Virtual Visit via Telephone Note  I connected with  Yvette Riley on 12/13/20 at  2:20 PM EST by telephone and verified that I am speaking with the correct person using two identifiers.  Location: Patient: HOME Provider: [facility] Persons participating in the virtual visit: patient/Nurse Health Advisor   I discussed the limitations, risks, security and privacy concerns of performing an evaluation and management service by telephone and the availability of in person appointments. The patient expressed understanding and agreed to proceed.  Interactive audio and video telecommunications were attempted between this nurse and patient, however failed, due to patient having technical difficulties OR patient did not have access to video capability.  We continued and completed visit with audio only.  Some vital signs may be absent or patient reported.    Yvette David, LPN    Subjective:   Yvette Riley is a 65 y.o. female who presents for Medicare Annual (Subsequent) preventive examination.  Review of Systems           Objective:    There were no vitals filed for this visit. There is no height or weight on file to calculate BMI.  Advanced Directives 09/29/2020 03/23/2020 12/08/2019 03/16/2019 01/28/2019 01/15/2019 11/25/2018  Does Patient Have a Medical Advance Directive? _0  No No  Type of Advance Directive - - - - - - -  Does patient want to make changes to medical advance directive? - - - - - - -  Copy of [place] in Chart? - - - - - - -  Would patient like information on creating a medical advance directive? - - No - Patient declined - Yes (MAU/Ambulatory/Procedural Areas - Information given) No - Patient declined No - Patient declined    Current Medications (verified) Outpatient Encounter Medications as of 12/13/2020  Medication Sig   albuterol (VENTOLIN HFA) 108 (90 Base) MCG/ACT inhaler Inhale 2 puffs into the lungs every 6 (six) hours as  needed for wheezing or shortness of breath.   Alirocumab (PRALUENT) 150 MG/ML SOAJ Inject 1 mL into the skin every 14 (fourteen) days.   ANORO ELLIPTA 62.5-25 MCG/INH AEPB Inhale 1 puff into the lungs daily.   aspirin 81 MG EC tablet Take 1 tablet (81 mg total) by mouth daily. Swallow whole.   benzonatate (TESSALON) 100 MG capsule Take 1 capsule by mouth three times daily as needed for cough   Blood Glucose Monitoring Suppl (ONE TOUCH ULTRA 2) w/Device KIT Use to check sugar daily for type 2 diabetes E11.9   clonazePAM (KLONOPIN) 0.5 MG tablet Take 1 tablet (0.5 mg total) by mouth 2 (two) times daily as needed.   clopidogrel (PLAVIX) 75 MG tablet Take 1 tablet (75 mg total) by mouth daily.   ezetimibe (ZETIA) 10 MG tablet Take 1 tablet by mouth once daily   fenofibrate 160 MG tablet Take 1 tablet (160 mg total) by mouth daily.   gabapentin (NEURONTIN) 600 MG tablet Take one tablet up to 4 times a day   glucose blood (ONETOUCH ULTRA) test strip Use to check blood sugar daily for type 2 diabetes E11.9   Iron, Ferrous Sulfate, 325 (65 Fe) MG TABS Take 325 mg by mouth daily.   Lancets (ONETOUCH ULTRASOFT) lancets Use to check sugar daily for type 2 diabetes E11.9   lisinopril (ZESTRIL) 10 MG tablet Take 10 mg by mouth daily.   meloxicam (MOBIC) 15 MG tablet TAKE 1 TABLET BY MOUTH ONCE DAILY AS NEEDED FOR  PAIN   metFORMIN (GLUCOPHAGE-XR) 500 MG 24 hr tablet Take 1 tablet (500 mg total) by mouth 2 (two) times daily with a meal.   metoprolol succinate (TOPROL-XL) 25 MG 24 hr tablet Take 1 tablet (25 mg total) by mouth daily.   montelukast (SINGULAIR) 10 MG tablet Take 1 tablet (10 mg total) by mouth at bedtime.   NARCAN 4 MG/0.1ML LIQD nasal spray kit Place 0.4 mg into the nose once.    ondansetron (ZOFRAN ODT) 4 MG disintegrating tablet Take 1 tablet (4 mg total) by mouth every 8 (eight) hours as needed for nausea or vomiting.   oxyCODONE-acetaminophen (PERCOCET) 10-325 MG tablet One tablet every 4-6  hours as needed.   sertraline (ZOLOFT) 50 MG tablet Take 1 tablet (50 mg total) by mouth daily.   sucralfate (CARAFATE) 1 g tablet TAKE 1 TABLET BY MOUTH 4 TIMES DAILY (WITH MEALS AND AT BEDTIME)   Tiotropium Bromide-Olodaterol 2.5-2.5 MCG/ACT AERS Inhale 2 puffs into the lungs daily.   zaleplon (SONATA) 10 MG capsule TAKE 1 CAPSULE BY MOUTH AT BEDTIME. MAY TAKE SECOND CAPSULE AFTER 2 HOURS IF NEEDED   No facility-administered encounter medications on file as of 12/13/2020.    Allergies (verified) Codeine, Other, Penicillins, Yellow jacket venom [bee venom], Crestor [rosuvastatin], Gemfibrozil, Trazodone and nefazodone, and Lipitor [atorvastatin]   History: Past Medical History:  Diagnosis Date   Allergy    Anemia    Anxiety    Arthritis    Asthma    Blood transfusion without reported diagnosis    C. difficile diarrhea 04/07/2016   CAP (community acquired pneumonia) 03/21/2015   Combined forms of age-related cataract of both eyes 05/06/2016   COPD (chronic obstructive pulmonary disease) ([place])    CVA (cerebral vascular accident) ([place]) 06/17/2018   Diabetes mellitus without complication ([place])    Displacement of lumbar intervertebral disc without myelopathy    Emphysema of lung ([facility])    GERD (gastroesophageal reflux disease)    Glaucoma    History of chicken pox    Hyperlipidemia    Hypertension    Pneumonia 03/29/2015   Sepsis ([place]) 03/17/2016   Shortness of breath    Small cell lung cancer ([place])    Past Surgical History:  Procedure Laterality Date   CESAREAN SECTION     TUBAL LIGATION     UPPER GI ENDOSCOPY     Family History  Problem Relation Age of Onset   Heart failure Mother    Heart disease Mother    Stroke Mother    Heart disease Brother    COPD Brother    Cancer Maternal Aunt        Breast Cancer   Social History   Socioeconomic History   Marital status: Widowed    Spouse name: Not on file   Number of children: 2   Years of education: Not on file   Highest  education level: 10th grade  Occupational History   Occupation: disable  Tobacco Use   Smoking status: Former    Packs/day: 0.25    Years: 45.00    Pack years: 11.25    Types: Cigarettes    Quit date: 03/17/2016    Years since quitting: 4.7   Smokeless tobacco: Never   Tobacco comments:    pt states she quit in 2016-2017  Vaping Use   Vaping Use: Never used  Substance and Sexual Activity   Alcohol use: No    Alcohol/week: 0.0 standard drinks   Drug use: No  Sexual activity: Not Currently    Birth control/protection: Abstinence  Other Topics Concern   Not on file  Social History Narrative   Not on file   Social Determinants of Health   Financial Resource Strain: Not on file  Food Insecurity: Not on file  Transportation Needs: Not on file  Physical Activity: Not on file  Stress: Not on file  Social Connections: Not on file    Tobacco Counseling Counseling given: Not Answered Tobacco comments: pt states she quit in 2016-2017   Clinical Intake:                 Diabetic?yes Nutrition Risk Assessment:  Has the patient had any N/V/D within the last 2 months?  No  Does the patient have any non-healing wounds?  No  Has the patient had any unintentional weight loss or weight gain?  No   Diabetes:  Is the patient diabetic?  Yes  If diabetic, was a CBG obtained today?  No  Did the patient bring in their glucometer from home?  No  How often do you monitor your CBG's? .   Financial Strains and Diabetes Management:  Are you having any financial strains with the device, your supplies or your medication? No .  Does the patient want to be seen by Chronic Care Management for management of their diabetes?  No  Would the patient like to be referred to a Nutritionist or for Diabetic Management?  No   Diabetic Exams:  Diabetic Eye Exam: Completed 01/10/16. Overdue for diabetic eye exam. Pt has been advised about the importance in completing this exam.   Diabetic  Foot Exam: Completed no. Pt has been advised about the importance in completing this exam.        Activities of Daily Living In your present state of health, do you have any difficulty performing the following activities: 09/25/2020 05/09/2020  Hearing? Tempie Donning  Vision? Y Y  Difficulty concentrating or making decisions? Tempie Donning  Walking or climbing stairs? Y Y  Dressing or bathing? Y N  Doing errands, shopping? Y N  Some recent data might be hidden    Patient Care Team: Birdie Sons, MD as PCP - General (Family Medicine) Lorelee Cover., MD (Ophthalmology) Vladimir Crofts, MD as Consulting Physician (Neurology) Yvette David, MD as Consulting Physician (Cardiology) Lloyd Huger, MD as Consulting Physician (Oncology) Noreene Filbert, MD as Referring Physician (Radiation Oncology)  Indicate any recent Medical Services you may have received from other than [facility] providers in the past year (date may be approximate).     Assessment:   This is a routine wellness examination for Kyree.  Hearing/Vision screen No results found.  Dietary issues and exercise activities discussed:     Goals Addressed   None    Depression Screen PHQ 2/9 Scores 09/25/2020 05/09/2020 01/10/2020 12/08/2019 11/25/2018 08/10/2018 11/20/2017  PHQ - 2 Score 1 1 0 0 0 2 0  PHQ- 9 Score _0 - - 9 3    Fall Risk Fall Risk  09/25/2020 12/08/2019 11/25/2018 11/20/2017 12/04/2016  Falls in the past year? _1 Yes No  Number falls in past yr: _2 or more -  Injury with Fall? 1 0 0 No -  Risk Factor Category  - - - High Fall Risk -  Risk for fall due to : History of fall(s) Impaired balance/gait - Impaired mobility;History of fall(s) -  Follow up Falls evaluation completed Falls  prevention discussed Falls prevention discussed Falls prevention discussed -    FALL RISK PREVENTION PERTAINING TO THE HOME:  Any stairs in or around the home? No  If so, are there any without handrails? No  Home free of  loose throw rugs in walkways, pet beds, electrical cords, etc? Yes  Adequate lighting in your home to reduce risk of falls? Yes   ASSISTIVE DEVICES UTILIZED TO PREVENT FALLS:  Life alert? No  Use of a cane, walker or w/c? Yes  Grab bars in the bathroom? Yes  Shower chair or bench in shower? Yes  Elevated toilet seat or a handicapped toilet? Yes    Cognitive Function:Cognitive status assessed by direct observation. On phone Patient is unable to complete screening 6CIT or MMSE.       6CIT Screen 11/20/2017  What Year? 0 points  What month? 0 points  What time? 0 points  Count back from 20 0 points  Months in reverse 0 points  Repeat phrase 0 points  Total Score 0    Immunizations Immunization History  Administered Date(s) Administered   Influenza,inj,Quad PF,6+ Mos 12/13/2014, 11/23/2016, 10/13/2017, 12/04/2018   Influenza-Unspecified 01/28/2014   Moderna Sars-Covid-2 Vaccination 08/05/2019   Pneumococcal Polysaccharide-23 02/24/2014    TDAP status: Due, Education has been provided regarding the importance of this vaccine. Advised may receive this vaccine at local pharmacy or Health Dept. Aware to provide a copy of the vaccination record if obtained from local pharmacy or Health Dept. Verbalized acceptance and understanding.  Flu Vaccine status: Declined, Education has been provided regarding the importance of this vaccine but patient still declined. Advised may receive this vaccine at local pharmacy or Health Dept. Aware to provide a copy of the vaccination record if obtained from local pharmacy or Health Dept. Verbalized acceptance and understanding.  Pneumococcal vaccine status: Up to date  Covid-19 vaccine status: Completed vaccines  Qualifies for Shingles Vaccine? Yes   Zostavax completed No   Shingrix Completed?: No.    Education has been provided regarding the importance of this vaccine. Patient has been advised to call insurance company to determine out of pocket  expense if they have not yet received this vaccine. Advised may also receive vaccine at local pharmacy or Health Dept. Verbalized acceptance and understanding.  Screening Tests Health Maintenance  Topic Date Due   FOOT EXAM  Never done   OPHTHALMOLOGY EXAM  Never done   Hepatitis C Screening  Never done   TETANUS/TDAP  Never done   Zoster Vaccines- Shingrix (1 of 2) Never done   PAP SMEAR-Modifier  Never done   COLONOSCOPY (Pts 45-50yr Insurance coverage will need to be confirmed)  Never done   MAMMOGRAM  01/02/2015   Pneumococcal Vaccine 151686Years old (2 - PCV) 02/25/2015   COVID-19 Vaccine (2 - Moderna risk series) 09/02/2019   INFLUENZA VACCINE  08/28/2020   HEMOGLOBIN A1C  03/27/2021   HIV Screening  Completed   HPV VACCINES  Aged Out    Health Maintenance  Health Maintenance Due  Topic Date Due   FOOT EXAM  Never done   OPHTHALMOLOGY EXAM  Never done   Hepatitis C Screening  Never done   TETANUS/TDAP  Never done   Zoster Vaccines- Shingrix (1 of 2) Never done   PAP SMEAR-Modifier  Never done   COLONOSCOPY (Pts 45-433yrInsurance coverage will need to be confirmed)  Never done   MAMMOGRAM  01/02/2015   Pneumococcal Vaccine 1964496ears old (2 - PCV) 02/25/2015  COVID-19 Vaccine (2 - Moderna risk series) 09/02/2019   INFLUENZA VACCINE  08/28/2020    Mammogram status: Completed 01/01/13. Repeat every year  Bone Density status: Ordered 12/13/20. Pt provided with contact info and advised to call to schedule appt.  Lung Cancer Screening: (Low Dose CT Chest recommended if Age 80-80 years, 30 pack-year currently smoking OR have quit w/in 15years.) does qualify.   Lung Cancer Screening Referral: has lung cancer  Additional Screening:  Hepatitis C Screening: does not qualify; Completed no  Vision Screening: Recommended annual ophthalmology exams for early detection of glaucoma and other disorders of the eye. Is the patient up to date with their annual eye exam?  No   Who is the provider or what is the name of the office in which the patient attends annual eye exams? Dr.Bell If pt is not established with a provider, would they like to be referred to a provider to establish care? No .   Dental Screening: Recommended annual dental exams for proper oral hygiene  Community Resource Referral / Chronic Care Management: CRR required this visit?  Yes   CCM required this visit?  No      Plan:     I have personally reviewed and noted the following in the patient's chart:   Medical and social history Use of alcohol, tobacco or illicit drugs  Current medications and supplements including opioid prescriptions.  Functional ability and status Nutritional status Physical activity Advanced directives List of other physicians Hospitalizations, surgeries, and ER visits in previous 12 months Vitals Screenings to include cognitive, depression, and falls Referrals and appointments  In addition, I have reviewed and discussed with patient certain preventive protocols, quality metrics, and best practice recommendations. A written personalized care plan for preventive services as well as general preventive health recommendations were provided to patient.     Yvette David, LPN   [id number]   Nurse Notes: pt needs someone to care for her when possible   I have reviewed the health advisor's note, was available for consultation, and agree with documentation and plan  Lelon Huh, MD

## 2020-12-29 ENCOUNTER — Telehealth: Payer: Self-pay | Admitting: Family Medicine

## 2020-12-29 ENCOUNTER — Other Ambulatory Visit: Payer: Self-pay | Admitting: Family Medicine

## 2020-12-29 DIAGNOSIS — G8929 Other chronic pain: Secondary | ICD-10-CM

## 2020-12-29 NOTE — Telephone Encounter (Signed)
Medication Refill - Medication:oxyCODONE-acetaminophen (PERCOCET) 10-325 MG tablet  Has the patient contacted their pharmacy? yes (Agent: If no, request that the patient contact the pharmacy for the refill. If patient does not wish to contact the pharmacy document the reason why and proceed with request.) (Agent: If yes, when and what did the pharmacy advise?)contact  pcp  Preferred Pharmacy (with phone number or street name):oxyCODONE-acetaminophen (PERCOCET) 10-325 MG tablet Has the patient been seen for an appointment in the last year OR does the patient have an upcoming appointment? yes  Agent: Please be advised that RX refills may take up to 3 business days. We ask that you follow-up with your pharmacy.

## 2020-12-30 NOTE — Telephone Encounter (Signed)
Requested medication (s) are due for refill today: no  Requested medication (s) are on the active medication list: yes  Last refill:  12/07/20 #180  Future visit scheduled: yes  Notes to clinic:  med not delegated to NT to RF   Requested Prescriptions  Pending Prescriptions Disp Refills   oxyCODONE-acetaminophen (PERCOCET) 10-325 MG tablet 180 tablet 0    Sig: One tablet every 4-6 hours as needed.     Not Delegated - Analgesics:  Opioid Agonist Combinations Failed - 12/30/2020  7:50 AM      Failed - This refill cannot be delegated      Failed - Urine Drug Screen completed in last 360 days      Passed - Valid encounter within last 6 months    Recent Outpatient Visits           3 months ago Type 2 diabetes mellitus with microalbuminuria, without long-term current use of insulin [facility])   [facility] Birdie Sons, MD   6 months ago [place], Donald E, MD   7 months ago History of CVA in adulthood   [place], Kirstie Peri, MD   11 months ago [place], Donald E, MD   1 year ago [place], Donald E, MD       Future Appointments             In 3 weeks Fisher, Kirstie Peri, MD [facility], [place]

## 2021-01-01 MED ORDER — OXYCODONE-ACETAMINOPHEN 10-325 MG PO TABS: ORAL_TABLET | ORAL | 0 refills | Status: DC

## 2021-01-10 DIAGNOSIS — R200 Anesthesia of skin: Secondary | ICD-10-CM | POA: Diagnosis not present

## 2021-01-10 DIAGNOSIS — S2249XA Multiple fractures of ribs, unspecified side, initial encounter for closed fracture: Secondary | ICD-10-CM | POA: Diagnosis not present

## 2021-01-10 DIAGNOSIS — W19XXXA Unspecified fall, initial encounter: Secondary | ICD-10-CM | POA: Diagnosis not present

## 2021-01-10 DIAGNOSIS — Z7902 Long term (current) use of antithrombotics/antiplatelets: Secondary | ICD-10-CM | POA: Diagnosis not present

## 2021-01-10 DIAGNOSIS — I10 Essential (primary) hypertension: Secondary | ICD-10-CM | POA: Diagnosis not present

## 2021-01-10 DIAGNOSIS — R918 Other nonspecific abnormal finding of lung field: Secondary | ICD-10-CM | POA: Diagnosis not present

## 2021-01-10 DIAGNOSIS — R519 Headache, unspecified: Secondary | ICD-10-CM | POA: Diagnosis not present

## 2021-01-10 DIAGNOSIS — R350 Frequency of micturition: Secondary | ICD-10-CM | POA: Diagnosis not present

## 2021-01-10 DIAGNOSIS — R630 Anorexia: Secondary | ICD-10-CM | POA: Diagnosis not present

## 2021-01-10 DIAGNOSIS — J449 Chronic obstructive pulmonary disease, unspecified: Secondary | ICD-10-CM | POA: Diagnosis not present

## 2021-01-10 DIAGNOSIS — R102 Pelvic and perineal pain: Secondary | ICD-10-CM | POA: Diagnosis not present

## 2021-01-10 DIAGNOSIS — M160 Bilateral primary osteoarthritis of hip: Secondary | ICD-10-CM | POA: Diagnosis not present

## 2021-01-10 DIAGNOSIS — R197 Diarrhea, unspecified: Secondary | ICD-10-CM | POA: Diagnosis not present

## 2021-01-10 DIAGNOSIS — S3992XA Unspecified injury of lower back, initial encounter: Secondary | ICD-10-CM | POA: Diagnosis not present

## 2021-01-10 DIAGNOSIS — E119 Type 2 diabetes mellitus without complications: Secondary | ICD-10-CM | POA: Diagnosis not present

## 2021-01-10 DIAGNOSIS — G319 Degenerative disease of nervous system, unspecified: Secondary | ICD-10-CM | POA: Diagnosis not present

## 2021-01-10 DIAGNOSIS — J189 Pneumonia, unspecified organism: Secondary | ICD-10-CM | POA: Diagnosis not present

## 2021-01-10 DIAGNOSIS — Z791 Long term (current) use of non-steroidal anti-inflammatories (NSAID): Secondary | ICD-10-CM | POA: Diagnosis not present

## 2021-01-10 DIAGNOSIS — R059 Cough, unspecified: Secondary | ICD-10-CM | POA: Diagnosis not present

## 2021-01-10 DIAGNOSIS — Z923 Personal history of irradiation: Secondary | ICD-10-CM | POA: Diagnosis not present

## 2021-01-10 DIAGNOSIS — M533 Sacrococcygeal disorders, not elsewhere classified: Secondary | ICD-10-CM | POA: Diagnosis not present

## 2021-01-10 DIAGNOSIS — Z85118 Personal history of other malignant neoplasm of bronchus and lung: Secondary | ICD-10-CM | POA: Diagnosis not present

## 2021-01-10 DIAGNOSIS — M6281 Muscle weakness (generalized): Secondary | ICD-10-CM | POA: Diagnosis not present

## 2021-01-10 DIAGNOSIS — Z7984 Long term (current) use of oral hypoglycemic drugs: Secondary | ICD-10-CM | POA: Diagnosis not present

## 2021-01-10 DIAGNOSIS — M47816 Spondylosis without myelopathy or radiculopathy, lumbar region: Secondary | ICD-10-CM | POA: Diagnosis not present

## 2021-01-10 DIAGNOSIS — R112 Nausea with vomiting, unspecified: Secondary | ICD-10-CM | POA: Diagnosis not present

## 2021-01-10 DIAGNOSIS — Z8673 Personal history of transient ischemic attack (TIA), and cerebral infarction without residual deficits: Secondary | ICD-10-CM | POA: Diagnosis not present

## 2021-01-10 DIAGNOSIS — Z20822 Contact with and (suspected) exposure to covid-19: Secondary | ICD-10-CM | POA: Diagnosis not present

## 2021-01-10 DIAGNOSIS — R7309 Other abnormal glucose: Secondary | ICD-10-CM | POA: Diagnosis not present

## 2021-01-10 DIAGNOSIS — S0990XA Unspecified injury of head, initial encounter: Secondary | ICD-10-CM | POA: Diagnosis not present

## 2021-01-10 DIAGNOSIS — I2510 Atherosclerotic heart disease of native coronary artery without angina pectoris: Secondary | ICD-10-CM | POA: Diagnosis not present

## 2021-01-10 DIAGNOSIS — R32 Unspecified urinary incontinence: Secondary | ICD-10-CM | POA: Diagnosis not present

## 2021-01-24 ENCOUNTER — Other Ambulatory Visit: Payer: Self-pay | Admitting: Family Medicine

## 2021-01-24 DIAGNOSIS — M5126 Other intervertebral disc displacement, lumbar region: Secondary | ICD-10-CM

## 2021-01-24 DIAGNOSIS — G8929 Other chronic pain: Secondary | ICD-10-CM

## 2021-01-24 NOTE — Telephone Encounter (Signed)
Medication Refill - Medication: gabapentin (NEURONTIN) 600 MG tablet  Has the patient contacted their pharmacy? No. Pt stated she always calls Dr Sabino Snipes office to get this refilled.  (Agent: If no, request that the patient contact the pharmacy for the refill. If patient does not wish to contact the pharmacy document the reason why and proceed with request.)  Preferred Pharmacy (with phone number or street name):   [place] (N), [place] - [address]  [place]) [place] [address]  Phone: [phone] Fax: [phone]  Hours: Not open 24 hours     Has the patient been seen for an appointment in the last year OR does the patient have an upcoming appointment? Yes.    Agent: Please be advised that RX refills may take up to 3 business days. We ask that you follow-up with your pharmacy.

## 2021-01-24 NOTE — Telephone Encounter (Signed)
Rx refilled 11/24/2020, #360 with 1 refill, a 6 month supply. Pt should have enough medication to last until 03/2021. Requested Prescriptions  Pending Prescriptions Disp Refills   gabapentin (NEURONTIN) 600 MG tablet 360 tablet 1    Sig: Take one tablet up to 4 times a day     Neurology: Anticonvulsants - gabapentin Passed - 01/24/2021  3:29 PM      Passed - Valid encounter within last 12 months    Recent Outpatient Visits          4 months ago Type 2 diabetes mellitus with microalbuminuria, without long-term current use of insulin [facility])   [facility] Birdie Sons, MD   7 months ago [place], Donald E, MD   8 months ago History of CVA in adulthood   [place], Kirstie Peri, MD   1 year ago [place], Donald E, MD   1 year ago [place], Donald E, MD      Future Appointments            In 2 days Fisher, Kirstie Peri, MD [facility], [place]

## 2021-01-24 NOTE — Telephone Encounter (Signed)
Requested medications are due for refill today.  unsure  Requested medications are on the active medications list.  yes  Last refill. 01/01/2021 #180 with 0 refills  Future visit scheduled.   yes  Notes to clinic.  Medication not delegated.    Requested Prescriptions  Pending Prescriptions Disp Refills   oxyCODONE-acetaminophen (PERCOCET) 10-325 MG tablet 180 tablet 0    Sig: One tablet every 4-6 hours as needed.     Not Delegated - Analgesics:  Opioid Agonist Combinations Failed - 01/24/2021  3:25 PM      Failed - This refill cannot be delegated      Failed - Urine Drug Screen completed in last 360 days      Passed - Valid encounter within last 6 months    Recent Outpatient Visits           4 months ago Type 2 diabetes mellitus with microalbuminuria, without long-term current use of insulin [facility])   [facility] Birdie Sons, MD   7 months ago [place], Donald E, MD   8 months ago History of CVA in adulthood   [place], Kirstie Peri, MD   1 year ago [place], Donald E, MD   1 year ago [place], Donald E, MD       Future Appointments             In 2 days Fisher, Kirstie Peri, MD [facility], [place]

## 2021-01-24 NOTE — Telephone Encounter (Signed)
Medication Refill - Medication:   oxyCODONE-acetaminophen (PERCOCET) 10-325 MG tablet   Has the patient contacted their pharmacy? No. Pt stated she always calls Dr Sabino Snipes office to get this refilled.  (Agent: If no, request that the patient contact the pharmacy for the refill. If patient does not wish to contact the pharmacy document the reason why and proceed with request.) (Agent: If yes, when and what did the pharmacy advise?)  Preferred Pharmacy (with phone number or street name):   [place] (N), [place] - [address]  [place] [address]  Phone: [phone] Fax: [phone]    Has the patient been seen for an appointment in the last year OR does the patient have an upcoming appointment? Yes.    Agent: Please be advised that RX refills may take up to 3 business days. We ask that you follow-up with your pharmacy.

## 2021-01-25 NOTE — Progress Notes (Signed)
Established patient visit   Patient: Yvette Riley   DOB: Dec 11, 1955   65 y.o. Female  MRN: [id number] Visit Date: 01/26/2021  Today's healthcare provider: Lelon Huh, MD   Chief Complaint  Patient presents with   Diabetes   Anxiety   Hyperlipidemia   Pain Management   Subjective    HPI  -Patient believes she had a stroke 2xs and thinks some of her symptoms could have been due to that. -Needed assistance with knowing how to get blood sugar; believed device was broken. Showed patient how to use and took sugar in office it was 165 mg/DL and patient has not eaten today -Reports being extra thirsty lately Diabetes Mellitus Type II, follow-up  Lab Results  Component Value Date   HGBA1C 6.9 (A) 09/25/2020   HGBA1C 6.3 (A) 05/09/2020   HGBA1C 6.4 (H) 09/07/2019   Last seen for diabetes 4 months ago.  Management since then includes continuing the same treatment. She reports excellent compliance with treatment. She is having side effects.   Episodes of hypoglycemia? Yes becomes extra fatigue   Current insulin regiment: none Most Recent Eye Exam: Needs to update -Reports not being able to see out of her right eye but no one is accepting new patients  --------------------------------------------------------------------------------------------------- Lipid/Cholesterol, follow-up  Last Lipid Panel: Lab Results  Component Value Date   CHOL 228 (H) 06/19/2020   LDLCALC 82 06/19/2020   LDLDIRECT 110 (H) 06/02/2018   HDL 35 (L) 06/19/2020   TRIG 689 ([place]) 06/19/2020    She was last seen for this 4 months ago.  Management since that visit includes work on getting coverage for - Evolocumab (REPATHA SURECLICK) 882 MG/ML SOAJ; Inject 140 mg into the skin every 14 (fourteen) days.  Dispense: 2 mL; Refill: 5.  She reports excellent compliance with treatment. (Taking Zetia) She is not having side effects.   Symptoms: No appetite changes No foot ulcerations  Yes chest  pain Yes chest pressure/discomfort  No dyspnea No orthopnea  Yes fatigue No lower extremity edema  Yes palpitations No paroxysmal nocturnal dyspnea  No nausea Yes numbness or tingling of extremity  No polydipsia No polyuria  No speech difficulty No syncope   She is following a Low Sodium diet. Current exercise: none  Last metabolic panel Lab Results  Component Value Date   GLUCOSE 128 (H) 06/19/2020   NA 140 06/19/2020   K 4.2 06/19/2020   BUN 11 06/19/2020   CREATININE 0.56 (L) 06/19/2020   EGFR 102 06/19/2020   GFRNONAA 89 09/07/2019   CALCIUM 9.2 06/19/2020   AST 27 06/19/2020   ALT 11 06/19/2020   The ASCVD Risk score (Arnett DK, et al., 2019) failed to calculate for the following reasons:   The patient has a prior MI or stroke diagnosis  ---------------------------------------------------------------------------------------------------  Anxiety, Follow-up  She was last seen for anxiety 4 months ago. Changes made at last visit include clonazePAM 0.5 mg.   She reports excellent compliance with treatment. She reports excellent tolerance of treatment. She is not having side effects.   She feels her anxiety is moderate and Worse since last visit.  Symptoms: Yes chest pain Yes difficulty concentrating  Yes dizziness Yes fatigue  No feelings of losing control Yes insomnia  Yes irritable Yes palpitations  Yes panic attacks Yes racing thoughts  Yes shortness of breath No sweating  Yes tremors/shakes    GAD-7 Results No flowsheet data found.  PHQ-9 Scores PHQ9 SCORE ONLY 12/13/2020 09/25/2020  05/09/2020  PHQ-9 Total Score _0 ---------------------------------------------------------------------------------------------------   Medications: Outpatient Medications Prior to Visit  Medication Sig   albuterol (VENTOLIN HFA) 108 (90 Base) MCG/ACT inhaler Inhale 2 puffs into the lungs every 6 (six) hours as needed for wheezing or shortness of breath.    Alirocumab (PRALUENT) 150 MG/ML SOAJ Inject 1 mL into the skin every 14 (fourteen) days.   ANORO ELLIPTA 62.5-25 MCG/INH AEPB Inhale 1 puff into the lungs daily.   aspirin 81 MG EC tablet Take 1 tablet (81 mg total) by mouth daily. Swallow whole.   benzonatate (TESSALON) 100 MG capsule Take 1 capsule by mouth three times daily as needed for cough (Patient not taking: Reported on 12/13/2020)   Blood Glucose Monitoring Suppl (ONE TOUCH ULTRA 2) w/Device KIT Use to check sugar daily for type 2 diabetes E11.9   clonazePAM (KLONOPIN) 0.5 MG tablet Take 1 tablet (0.5 mg total) by mouth 2 (two) times daily as needed.   clopidogrel (PLAVIX) 75 MG tablet Take 1 tablet (75 mg total) by mouth daily.   ezetimibe (ZETIA) 10 MG tablet Take 1 tablet by mouth once daily   fenofibrate 160 MG tablet Take 1 tablet (160 mg total) by mouth daily.   gabapentin (NEURONTIN) 600 MG tablet Take one tablet up to 4 times a day   glucose blood (ONETOUCH ULTRA) test strip Use to check blood sugar daily for type 2 diabetes E11.9   Iron, Ferrous Sulfate, 325 (65 Fe) MG TABS Take 325 mg by mouth daily.   Lancets (ONETOUCH ULTRASOFT) lancets Use to check sugar daily for type 2 diabetes E11.9   lisinopril (ZESTRIL) 10 MG tablet Take 10 mg by mouth daily.   meloxicam (MOBIC) 15 MG tablet TAKE 1 TABLET BY MOUTH ONCE DAILY AS NEEDED FOR PAIN (Patient not taking: Reported on 12/13/2020)   metFORMIN (GLUCOPHAGE-XR) 500 MG 24 hr tablet Take 1 tablet (500 mg total) by mouth 2 (two) times daily with a meal.   metoprolol succinate (TOPROL-XL) 25 MG 24 hr tablet Take 1 tablet (25 mg total) by mouth daily.   montelukast (SINGULAIR) 10 MG tablet Take 1 tablet (10 mg total) by mouth at bedtime.   NARCAN 4 MG/0.1ML LIQD nasal spray kit Place 0.4 mg into the nose once.    ondansetron (ZOFRAN ODT) 4 MG disintegrating tablet Take 1 tablet (4 mg total) by mouth every 8 (eight) hours as needed for nausea or vomiting. (Patient not taking: Reported on  12/13/2020)   oxyCODONE-acetaminophen (PERCOCET) 10-325 MG tablet One tablet every 4-6 hours as needed.   sertraline (ZOLOFT) 50 MG tablet Take 1 tablet (50 mg total) by mouth daily. (Patient not taking: Reported on 12/13/2020)   sucralfate (CARAFATE) 1 g tablet TAKE 1 TABLET BY MOUTH 4 TIMES DAILY (WITH MEALS AND AT BEDTIME) (Patient not taking: Reported on 12/13/2020)   Tiotropium Bromide-Olodaterol 2.5-2.5 MCG/ACT AERS Inhale 2 puffs into the lungs daily.   zaleplon (SONATA) 10 MG capsule TAKE 1 CAPSULE BY MOUTH AT BEDTIME. MAY TAKE SECOND CAPSULE AFTER 2 HOURS IF NEEDED   No facility-administered medications prior to visit.    Review of Systems     Objective    BP 113/71 (BP Location: Left Arm, Patient Position: Sitting, Cuff Size: Large)    Pulse (!) 124    Temp (!) 97.5 F (36.4 C) (Temporal)    Wt 179 lb 9.6 oz (81.5 kg)    SpO2 99%    BMI 37.54 kg/m  {  Show previous vital signs (optional):23777}  Physical Exam    General: Appearance:    Severely obese female in no acute distress  Eyes:    PERRL, conjunctiva/corneas clear, EOM's intact       Lungs:     Diffuse expiratory wheezes, respirations unlabored  Heart:    Tachycardic. Normal rhythm. No murmurs, rubs, or gallops.    MS:   All extremities are intact.    Neurologic:   Awake, alert, oriented x 3. No apparent focal neurological defect.        Results for orders placed or performed in visit on 01/26/21  POCT HgB A1C  Result Value Ref Range   Hemoglobin A1C 6.9 (A) 4.0 - 5.6 %     Assessment & Plan     1. Type 2 diabetes mellitus with microalbuminuria, without long-term current use of insulin (HCC) Well controlled.  Continue current medications.  refill metFORMIN (GLUCOPHAGE-XR) 500 MG 24 hr tablet; Take 1 tablet (500 mg total) by mouth 2 (two) times daily with a meal.  Dispense: 180 tablet; Refill: 4  2. Uncomplicated asthma, unspecified asthma severity, unspecified whether persistent Stable on current inhaler  rigment.   3. Chronic, continuous use of opioids Continue current medications.    4. Hyperglyceridemia, pure  - Lipid panel  5. B12 deficiency  - Vitamin B12  6. Type 2 diabetes mellitus without complication, without long-term current use of insulin (HCC)   7. Displacement of lumbar intervertebral disc without myelopathy  - gabapentin (NEURONTIN) 600 MG tablet; Take one tablet up to 4 times a day  Dispense: 360 tablet; Refill: 4  8. Primary insomnia refill zaleplon (SONATA) 10 MG capsule; TAKE 1 CAPSULE BY MOUTH AT BEDTIME. MAY TAKE SECOND CAPSULE AFTER 2 HOURS IF NEEDED  Dispense: 30 capsule; Refill: 5  9. Visual changes  - Ambulatory referral to Ophthalmology         The entirety of the information documented in the History of Present Illness, Review of Systems and Physical Exam were personally obtained by me. Portions of this information were initially documented by the CMA and reviewed by me for thoroughness and accuracy.     Lelon Huh, MD  [facility] [phone] (phone) [phone] (fax)  [place]

## 2021-01-26 ENCOUNTER — Encounter: Payer: Self-pay | Admitting: Family Medicine

## 2021-01-26 ENCOUNTER — Ambulatory Visit (INDEPENDENT_AMBULATORY_CARE_PROVIDER_SITE_OTHER): Payer: Medicare HMO | Admitting: Family Medicine

## 2021-01-26 ENCOUNTER — Other Ambulatory Visit: Payer: Self-pay

## 2021-01-26 VITALS — BP 113/71 | HR 124 | Temp 97.5°F | Wt 179.6 lb

## 2021-01-26 DIAGNOSIS — F1190 Opioid use, unspecified, uncomplicated: Secondary | ICD-10-CM

## 2021-01-26 DIAGNOSIS — E1129 Type 2 diabetes mellitus with other diabetic kidney complication: Secondary | ICD-10-CM

## 2021-01-26 DIAGNOSIS — R809 Proteinuria, unspecified: Secondary | ICD-10-CM | POA: Diagnosis not present

## 2021-01-26 DIAGNOSIS — H539 Unspecified visual disturbance: Secondary | ICD-10-CM | POA: Diagnosis not present

## 2021-01-26 DIAGNOSIS — E119 Type 2 diabetes mellitus without complications: Secondary | ICD-10-CM | POA: Diagnosis not present

## 2021-01-26 DIAGNOSIS — J45909 Unspecified asthma, uncomplicated: Secondary | ICD-10-CM

## 2021-01-26 DIAGNOSIS — F5101 Primary insomnia: Secondary | ICD-10-CM

## 2021-01-26 DIAGNOSIS — E538 Deficiency of other specified B group vitamins: Secondary | ICD-10-CM

## 2021-01-26 DIAGNOSIS — M5126 Other intervertebral disc displacement, lumbar region: Secondary | ICD-10-CM | POA: Diagnosis not present

## 2021-01-26 DIAGNOSIS — E781 Pure hyperglyceridemia: Secondary | ICD-10-CM

## 2021-01-26 DIAGNOSIS — R69 Illness, unspecified: Secondary | ICD-10-CM | POA: Diagnosis not present

## 2021-01-26 LAB — POCT GLYCOSYLATED HEMOGLOBIN (HGB A1C): Hemoglobin A1C: 6.9 % — AB (ref 4.0–5.6)

## 2021-01-26 MED ORDER — METFORMIN HCL ER 500 MG PO TB24: 500.0000 mg | ORAL_TABLET | Freq: Two times a day (BID) | ORAL | 4 refills | Status: DC

## 2021-01-26 MED ORDER — GABAPENTIN 600 MG PO TABS: ORAL_TABLET | ORAL | 4 refills | Status: DC

## 2021-01-26 MED ORDER — ZALEPLON 10 MG PO CAPS: ORAL_CAPSULE | ORAL | 5 refills | Status: DC

## 2021-01-26 NOTE — Telephone Encounter (Signed)
30 day refill sent 01-01-2021

## 2021-01-26 NOTE — Patient Instructions (Addendum)
Please review the attached list of medications and notify my office if there are any errors.   Please go to the lab draw station in [address] on the second floor of [facility]  when you are fasting for 8 hours. Normal hours are 8:00am to 11:30am and 1:00pm to 4:00pm Monday through Friday  THE LAB IS CLOSED ON MONDAY JANUARY 2nd

## 2021-01-29 MED ORDER — OXYCODONE-ACETAMINOPHEN 10-325 MG PO TABS: ORAL_TABLET | ORAL | 0 refills | Status: DC

## 2021-02-16 ENCOUNTER — Telehealth: Payer: Self-pay | Admitting: Family Medicine

## 2021-02-16 NOTE — Telephone Encounter (Signed)
Patient called, left VM that refills are at [facility] and to call them for the medication, call back to the office if any questions. [place] called and spoke to [place], [facility] about the refill(s) Gabapentin requested. Advised it was sent on 01/26/21 #360/4 refill(s). He says they have it on file because she already picked up a 3 month supply on 12/12/20 and it's too soon to refill. Patient will need to be told this information if she returns the call.

## 2021-02-16 NOTE — Telephone Encounter (Signed)
Copied from [place] [id number]. Topic: Quick Communication - Rx Refill/Question >> Feb 16, 2021 11:03 AM Tessa Lerner A wrote: Medication: gabapentin (NEURONTIN) 600 MG tablet [[id number]]   Has the patient contacted their pharmacy? No. (Agent: If no, request that the patient contact the pharmacy for the refill. If patient does not wish to contact the pharmacy document the reason why and proceed with request.) (Agent: If yes, when and what did the pharmacy advise?)  Preferred Pharmacy (with phone number or street name): [employer] (N), [place] - [address] [place] [address] Phone: [phone] Fax: [phone]   Has the patient been seen for an appointment in the last year OR does the patient have an upcoming appointment? Yes.    Agent: Please be advised that RX refills may take up to 3 business days. We ask that you follow-up with your pharmacy.

## 2021-02-16 NOTE — Telephone Encounter (Signed)
Copied from [place] [phone]. Topic: Quick Communication - Rx Refill/Question >> Feb 16, 2021 11:06 AM Tessa Lerner A wrote: Medication: zaleplon (SONATA) 10 MG capsule [[id number]]   Has the patient contacted their pharmacy? No. The patient was uncertain of the status of the medication  (Agent: If no, request that the patient contact the pharmacy for the refill. If patient does not wish to contact the pharmacy document the reason why and proceed with request.) (Agent: If yes, when and what did the pharmacy advise?)  Preferred Pharmacy (with phone number or street name): [place] (N), [place] - [address] [place] [address] Phone: [phone] Fax: [phone]   Has the patient been seen for an appointment in the last year OR does the patient have an upcoming appointment? Yes.    Agent: Please be advised that RX refills may take up to 3 business days. We ask that you follow-up with your pharmacy.

## 2021-02-16 NOTE — Telephone Encounter (Signed)
[place] called and spoke to [facility], [employer] about the refill(s) Zaleplon requested. Advised it was sent on 01/26/21 #30/5 refill(s). She says they do have it on file because she picked up a 30 day supply on 01/26/21 and it's too soon for refills. If patient returns call about this refill, let her know the above.

## 2021-02-22 ENCOUNTER — Telehealth: Payer: Self-pay

## 2021-02-22 NOTE — Telephone Encounter (Signed)
App't is on 02/27/21, are you ok with it being phone visit     Copied from [place] [phone]. Topic: Appointment Scheduling - Scheduling Inquiry for Clinic >> Feb 22, 2021  2:26 PM Erick Blinks wrote: Reason for CRM: Pt wants to have appt via phone, states she does not have a ride to the appt. Best contact: [phone]

## 2021-02-24 DIAGNOSIS — L539 Erythematous condition, unspecified: Secondary | ICD-10-CM | POA: Diagnosis not present

## 2021-02-24 DIAGNOSIS — J449 Chronic obstructive pulmonary disease, unspecified: Secondary | ICD-10-CM | POA: Diagnosis not present

## 2021-02-24 DIAGNOSIS — R079 Chest pain, unspecified: Secondary | ICD-10-CM | POA: Diagnosis not present

## 2021-02-24 DIAGNOSIS — H539 Unspecified visual disturbance: Secondary | ICD-10-CM | POA: Diagnosis not present

## 2021-02-24 DIAGNOSIS — I639 Cerebral infarction, unspecified: Secondary | ICD-10-CM | POA: Diagnosis not present

## 2021-02-24 DIAGNOSIS — G319 Degenerative disease of nervous system, unspecified: Secondary | ICD-10-CM | POA: Diagnosis not present

## 2021-02-24 DIAGNOSIS — E1140 Type 2 diabetes mellitus with diabetic neuropathy, unspecified: Secondary | ICD-10-CM | POA: Diagnosis not present

## 2021-02-24 DIAGNOSIS — G9341 Metabolic encephalopathy: Secondary | ICD-10-CM | POA: Diagnosis not present

## 2021-02-24 DIAGNOSIS — R000 Tachycardia, unspecified: Secondary | ICD-10-CM | POA: Diagnosis not present

## 2021-02-24 DIAGNOSIS — R1110 Vomiting, unspecified: Secondary | ICD-10-CM | POA: Diagnosis not present

## 2021-02-24 DIAGNOSIS — R918 Other nonspecific abnormal finding of lung field: Secondary | ICD-10-CM | POA: Diagnosis not present

## 2021-02-24 DIAGNOSIS — R4182 Altered mental status, unspecified: Secondary | ICD-10-CM | POA: Diagnosis not present

## 2021-02-24 DIAGNOSIS — R5383 Other fatigue: Secondary | ICD-10-CM | POA: Diagnosis not present

## 2021-02-24 DIAGNOSIS — I4510 Unspecified right bundle-branch block: Secondary | ICD-10-CM | POA: Diagnosis not present

## 2021-02-24 DIAGNOSIS — C3491 Malignant neoplasm of unspecified part of right bronchus or lung: Secondary | ICD-10-CM | POA: Diagnosis not present

## 2021-02-24 DIAGNOSIS — G4700 Insomnia, unspecified: Secondary | ICD-10-CM | POA: Diagnosis not present

## 2021-02-24 DIAGNOSIS — Z20822 Contact with and (suspected) exposure to covid-19: Secondary | ICD-10-CM | POA: Diagnosis not present

## 2021-02-24 DIAGNOSIS — I10 Essential (primary) hypertension: Secondary | ICD-10-CM | POA: Diagnosis not present

## 2021-02-25 DIAGNOSIS — H532 Diplopia: Secondary | ICD-10-CM | POA: Diagnosis not present

## 2021-02-25 DIAGNOSIS — H538 Other visual disturbances: Secondary | ICD-10-CM | POA: Diagnosis not present

## 2021-02-25 DIAGNOSIS — R4182 Altered mental status, unspecified: Secondary | ICD-10-CM | POA: Diagnosis not present

## 2021-02-25 DIAGNOSIS — R079 Chest pain, unspecified: Secondary | ICD-10-CM | POA: Diagnosis not present

## 2021-02-25 DIAGNOSIS — H5500 Unspecified nystagmus: Secondary | ICD-10-CM | POA: Diagnosis not present

## 2021-02-25 DIAGNOSIS — R296 Repeated falls: Secondary | ICD-10-CM | POA: Diagnosis not present

## 2021-02-25 DIAGNOSIS — Z8673 Personal history of transient ischemic attack (TIA), and cerebral infarction without residual deficits: Secondary | ICD-10-CM | POA: Diagnosis not present

## 2021-02-26 DIAGNOSIS — H5500 Unspecified nystagmus: Secondary | ICD-10-CM | POA: Diagnosis not present

## 2021-02-26 DIAGNOSIS — R296 Repeated falls: Secondary | ICD-10-CM | POA: Diagnosis not present

## 2021-02-26 DIAGNOSIS — H532 Diplopia: Secondary | ICD-10-CM | POA: Diagnosis not present

## 2021-02-26 DIAGNOSIS — G8929 Other chronic pain: Secondary | ICD-10-CM | POA: Diagnosis not present

## 2021-02-26 DIAGNOSIS — R079 Chest pain, unspecified: Secondary | ICD-10-CM | POA: Diagnosis not present

## 2021-02-26 NOTE — Progress Notes (Deleted)
Acute Office Visit  Subjective:    Patient ID: Yvette Riley, female    DOB: 07/23/1955, 66 y.o.   MRN: [id number]  No chief complaint on file.   HPI Patient is in today for evaluation after a fall.  Past Medical History:  Diagnosis Date   Allergy    Anemia    Anxiety    Arthritis    Asthma    Blood transfusion without reported diagnosis    C. difficile diarrhea 04/07/2016   CAP (community acquired pneumonia) 03/21/2015   Combined forms of age-related cataract of both eyes 05/06/2016   COPD (chronic obstructive pulmonary disease) (HCC)    CVA (cerebral vascular accident) ([place]) 06/17/2018   Diabetes mellitus without complication ([place])    Displacement of lumbar intervertebral disc without myelopathy    Emphysema of lung (HCC)    GERD (gastroesophageal reflux disease)    Glaucoma    History of chicken pox    Hyperlipidemia    Hypertension    Pneumonia 03/29/2015   Sepsis ([place]) 03/17/2016   Shortness of breath    Small cell lung cancer ([place])     Past Surgical History:  Procedure Laterality Date   CESAREAN SECTION     TUBAL LIGATION     UPPER GI ENDOSCOPY      Family History  Problem Relation Age of Onset   Heart failure Mother    Heart disease Mother    Stroke Mother    Heart disease Brother    COPD Brother    Cancer Maternal Aunt        Breast Cancer    Social History   Socioeconomic History   Marital status: Widowed    Spouse name: Not on file   Number of children: 2   Years of education: Not on file   Highest education level: 10th grade  Occupational History   Occupation: disable  Tobacco Use   Smoking status: Former    Packs/day: 0.25    Years: 45.00    Pack years: 11.25    Types: Cigarettes    Quit date: 03/17/2016    Years since quitting: 4.9   Smokeless tobacco: Never   Tobacco comments:    pt states she quit in 2016-2017  Vaping Use   Vaping Use: Never used  Substance and Sexual Activity   Alcohol use: No    Alcohol/week: 0.0 standard  drinks   Drug use: No   Sexual activity: Not Currently    Birth control/protection: Abstinence  Other Topics Concern   Not on file  Social History Narrative   Not on file   Social Determinants of Health   Financial Resource Strain: Low Risk    Difficulty of Paying Living Expenses: Not very hard  Food Insecurity: Unknown   Worried About Charity fundraiser in the Last Year: Not on file   [employer] of Food in the Last Year: Never true  Transportation Needs: No Transportation Needs   Lack of Transportation (Medical): No   Lack of Transportation (Non-Medical): No  Physical Activity: Inactive   Days of Exercise per Week: 0 days   Minutes of Exercise per Session: 0 min  Stress: No Stress Concern Present   Feeling of Stress : Not at all  Social Connections: Socially Isolated   Frequency of Communication with Friends and Family: More than three times a week   Frequency of Social Gatherings with Friends and Family: Three times a week   Attends Religious Services: Never  Active Member of Clubs or Organizations: No   Attends Archivist Meetings: Never   Marital Status: Widowed  Human resources officer Violence: Not At Risk   Fear of Current or Ex-Partner: No   Emotionally Abused: No   Physically Abused: No   Sexually Abused: No    Outpatient Medications Prior to Visit  Medication Sig Dispense Refill   albuterol (VENTOLIN HFA) 108 (90 Base) MCG/ACT inhaler Inhale 2 puffs into the lungs every 6 (six) hours as needed for wheezing or shortness of breath. 18 g 3   Alirocumab (PRALUENT) 150 MG/ML SOAJ Inject 1 mL into the skin every 14 (fourteen) days. 2 mL 12   ANORO ELLIPTA 62.5-25 MCG/INH AEPB Inhale 1 puff into the lungs daily. 60 each 5   aspirin 81 MG EC tablet Take 1 tablet (81 mg total) by mouth daily. Swallow whole.     benzonatate (TESSALON) 100 MG capsule Take 1 capsule by mouth three times daily as needed for cough (Patient not taking: Reported on 01/26/2021) 60 capsule 5    Blood Glucose Monitoring Suppl (ONE TOUCH ULTRA 2) w/Device KIT Use to check sugar daily for type 2 diabetes E11.9 1 kit 0   clonazePAM (KLONOPIN) 0.5 MG tablet Take 1 tablet (0.5 mg total) by mouth 2 (two) times daily as needed. 60 tablet 3   clopidogrel (PLAVIX) 75 MG tablet Take 1 tablet (75 mg total) by mouth daily. 90 tablet 1   ezetimibe (ZETIA) 10 MG tablet Take 1 tablet by mouth once daily 90 tablet 4   fenofibrate 160 MG tablet Take 1 tablet (160 mg total) by mouth daily. 90 tablet 0   gabapentin (NEURONTIN) 600 MG tablet Take one tablet up to 4 times a day 360 tablet 4   glucose blood (ONETOUCH ULTRA) test strip Use to check blood sugar daily for type 2 diabetes E11.9 100 strip 4   Iron, Ferrous Sulfate, 325 (65 Fe) MG TABS Take 325 mg by mouth daily. 100 tablet 3   Lancets (ONETOUCH ULTRASOFT) lancets Use to check sugar daily for type 2 diabetes E11.9 100 each 4   lisinopril (ZESTRIL) 10 MG tablet Take 10 mg by mouth daily.     meloxicam (MOBIC) 15 MG tablet TAKE 1 TABLET BY MOUTH ONCE DAILY AS NEEDED FOR PAIN 90 tablet 3   metFORMIN (GLUCOPHAGE-XR) 500 MG 24 hr tablet Take 1 tablet (500 mg total) by mouth 2 (two) times daily with a meal. 180 tablet 4   metoprolol succinate (TOPROL-XL) 25 MG 24 hr tablet Take 1 tablet (25 mg total) by mouth daily. 90 tablet 2   montelukast (SINGULAIR) 10 MG tablet Take 1 tablet (10 mg total) by mouth at bedtime. 90 tablet 3   NARCAN 4 MG/0.1ML LIQD nasal spray kit Place 0.4 mg into the nose once.   99   ondansetron (ZOFRAN ODT) 4 MG disintegrating tablet Take 1 tablet (4 mg total) by mouth every 8 (eight) hours as needed for nausea or vomiting. 20 tablet 0   oxyCODONE-acetaminophen (PERCOCET) 10-325 MG tablet One tablet every 4-6 hours as needed. 180 tablet 0   sertraline (ZOLOFT) 50 MG tablet Take 1 tablet (50 mg total) by mouth daily. 90 tablet 3   sucralfate (CARAFATE) 1 g tablet TAKE 1 TABLET BY MOUTH 4 TIMES DAILY (WITH MEALS AND AT BEDTIME) 360  tablet 4   Tiotropium Bromide-Olodaterol 2.5-2.5 MCG/ACT AERS Inhale 2 puffs into the lungs daily. 4 g 3   zaleplon (SONATA) 10 MG capsule  TAKE 1 CAPSULE BY MOUTH AT BEDTIME. MAY TAKE SECOND CAPSULE AFTER 2 HOURS IF NEEDED 30 capsule 5   No facility-administered medications prior to visit.    Allergies  Allergen Reactions   Codeine Anaphylaxis   Other Anaphylaxis   Penicillins Anaphylaxis, Rash and Other (See Comments)    Reaction:  Tongue swelling  Has patient had a PCN reaction causing immediate rash, facial/tongue/throat swelling, SOB or lightheadedness with hypotension:  Yes   Has patient had a PCN reaction causing severe rash involving mucus membranes or skin necrosis: No Has patient had a PCN reaction that required hospitalization No Has patient had a PCN reaction occurring within the last 10 years: No If all of the above answers are "NO", then may proceed with Cephalosporin use.   Yellow Jacket Venom [Bee Venom] Anaphylaxis   Crestor [Rosuvastatin] Nausea And Vomiting    Corrected prior adverse reaction per [facility] [employer]. On 12/02/3011  patient reported N & V when she takes Crestor   Gemfibrozil Rash, Nausea And Vomiting and Swelling   Trazodone And Nefazodone Nausea And Vomiting   Lipitor [Atorvastatin] Nausea Only    By patient report 12/02/2011. Had also been prescribed pravastatin and lovastatin by previous MD. Unclear if those caused same side effects.     Review of Systems     Objective:    Physical Exam  There were no vitals taken for this visit. Wt Readings from Last 3 Encounters:  01/26/21 179 lb 9.6 oz (81.5 kg)  09/29/20 190 lb (86.2 kg)  06/19/20 183 lb 9.6 oz (83.3 kg)    Health Maintenance Due  Topic Date Due   FOOT EXAM  Never done   OPHTHALMOLOGY EXAM  Never done   Hepatitis C Screening  Never done   TETANUS/TDAP  Never done   Zoster Vaccines- Shingrix (1 of 2) Never done   PAP SMEAR-Modifier  Never done   COLONOSCOPY (Pts 45-50yrs Insurance  coverage will need to be confirmed)  Never done   MAMMOGRAM  01/02/2015   COVID-19 Vaccine (2 - Moderna risk series) 09/02/2019   INFLUENZA VACCINE  08/28/2020    There are no preventive care reminders to display for this patient.   Lab Results  Component Value Date   TSH 2.540 09/07/2019   Lab Results  Component Value Date   WBC 9.4 06/19/2020   HGB 12.4 06/19/2020   HCT 36.8 06/19/2020   MCV 101 (H) 06/19/2020   PLT 326 06/19/2020   Lab Results  Component Value Date   NA 140 06/19/2020   K 4.2 06/19/2020   CO2 24 06/19/2020   GLUCOSE 128 (H) 06/19/2020   BUN 11 06/19/2020   CREATININE 0.56 (L) 06/19/2020   BILITOT 0.5 06/19/2020   ALKPHOS 102 06/19/2020   AST 27 06/19/2020   ALT 11 06/19/2020   PROT 7.3 06/19/2020   ALBUMIN 4.9 (H) 06/19/2020   CALCIUM 9.2 06/19/2020   ANIONGAP 14 03/16/2019   EGFR 102 06/19/2020   Lab Results  Component Value Date   CHOL 228 (H) 06/19/2020   Lab Results  Component Value Date   HDL 35 (L) 06/19/2020   Lab Results  Component Value Date   LDLCALC 82 06/19/2020   Lab Results  Component Value Date   TRIG 689 (HH) 06/19/2020   Lab Results  Component Value Date   CHOLHDL 6.5 (H) 06/19/2020   Lab Results  Component Value Date   HGBA1C 6.9 (A) 01/26/2021       Assessment &  Plan:   Problem List Items Addressed This Visit   None    No orders of the defined types were placed in this encounter.   Argentina Ponder Micaiah Remillard,acting as a scribe for Lelon Huh, MD.,have documented all relevant documentation on the behalf of Lelon Huh, MD,as directed by  Lelon Huh, MD while in the presence of Lelon Huh, Arnold, CMA

## 2021-02-27 ENCOUNTER — Telehealth: Payer: Self-pay

## 2021-02-27 ENCOUNTER — Ambulatory Visit: Payer: Medicare HMO | Admitting: Family Medicine

## 2021-02-27 DIAGNOSIS — C3491 Malignant neoplasm of unspecified part of right bronchus or lung: Secondary | ICD-10-CM | POA: Diagnosis not present

## 2021-02-27 DIAGNOSIS — J449 Chronic obstructive pulmonary disease, unspecified: Secondary | ICD-10-CM | POA: Diagnosis not present

## 2021-02-27 DIAGNOSIS — I10 Essential (primary) hypertension: Secondary | ICD-10-CM | POA: Diagnosis not present

## 2021-02-27 DIAGNOSIS — E119 Type 2 diabetes mellitus without complications: Secondary | ICD-10-CM | POA: Diagnosis not present

## 2021-02-27 NOTE — Telephone Encounter (Signed)
Copied from [place] [id number]. Topic: General - Other >> Feb 27, 2021  2:56 PM Valere Dross wrote: Reason for CRM: Pt states she is currently in the hospital, and stated they wont give her cough medicine, requesting if PCP could prescribe her some, pt also had a sch appt this afternoon. Please advise.

## 2021-02-28 ENCOUNTER — Telehealth: Payer: Self-pay | Admitting: Family Medicine

## 2021-02-28 ENCOUNTER — Telehealth: Payer: Self-pay

## 2021-02-28 ENCOUNTER — Other Ambulatory Visit: Payer: Self-pay | Admitting: Family Medicine

## 2021-02-28 DIAGNOSIS — M5126 Other intervertebral disc displacement, lumbar region: Secondary | ICD-10-CM

## 2021-02-28 DIAGNOSIS — G8929 Other chronic pain: Secondary | ICD-10-CM

## 2021-02-28 NOTE — Telephone Encounter (Signed)
Copied from [place] [id number]. Topic: Quick Communication - Rx Refill/Question >> Feb 28, 2021  9:53 AM Tessa Lerner A wrote: Medication: gabapentin (NEURONTIN) 600 MG tablet [[id number]]   Has the patient contacted their pharmacy? No. (Agent: If no, request that the patient contact the pharmacy for the refill. If patient does not wish to contact the pharmacy document the reason why and proceed with request.) (Agent: If yes, when and what did the pharmacy advise?)  Preferred Pharmacy (with phone number or street name): [facility] ([facility]), [place] - [address] [place] ([place]) [place] [address] Phone: [phone] Fax: [phone]   Has the patient been seen for an appointment in the last year OR does the patient have an upcoming appointment? Yes.    Agent: Please be advised that RX refills may take up to 3 business days. We ask that you follow-up with your pharmacy.

## 2021-02-28 NOTE — Telephone Encounter (Addendum)
Transition Care Management Unsuccessful Follow-up Telephone Call  Date of discharge and from where:  [place] 02-27-21 Dx: AMS- Toxic metabolic encephalopathy   Attempts:  1st Attempt  Reason for unsuccessful TCM follow-up call:  Left voice message  Transition Care Management Unsuccessful Follow-up Telephone Call  Date of discharge and from where:  [place] 02-27-21 Dx: AMS- Toxic metabolic encephalopathy   Attempts:  2nd Attempt  Reason for unsuccessful TCM follow-up call:  Left voice message  Transition Care Management Unsuccessful Follow-up Telephone Call  Date of discharge and from where:  [place] 02-27-21 Dx: AMS- Toxic metabolic encephalopathy   Attempts:  3rd Attempt  Reason for unsuccessful TCM follow-up call:  Left voice message

## 2021-02-28 NOTE — Telephone Encounter (Signed)
Requested medication (s) are due for refill today: due 03/01/21  Requested medication (s) are on the active medication list: yes    Last refill: 01/29/21 #180  0 refills  Future visit scheduled no  Notes to clinic:Not delegated  Requested Prescriptions  Pending Prescriptions Disp Refills   oxyCODONE-acetaminophen (PERCOCET) 10-325 MG tablet 180 tablet 0    Sig: One tablet every 4-6 hours as needed.     Not Delegated - Analgesics:  Opioid Agonist Combinations Failed - 02/28/2021  1:32 PM      Failed - This refill cannot be delegated      Failed - Urine Drug Screen completed in last 360 days      Passed - Valid encounter within last 3 months    Recent Outpatient Visits           1 month ago Type 2 diabetes mellitus with microalbuminuria, without long-term current use of insulin [facility]   [facility] Birdie Sons, MD   5 months ago Type 2 diabetes mellitus with microalbuminuria, without long-term current use of insulin [facility])   [facility] Birdie Sons, MD   8 months ago [place], Donald E, MD   9 months ago History of CVA in adulthood   [place], Kirstie Peri, MD   1 year ago [place], Kirstie Peri, MD

## 2021-02-28 NOTE — Telephone Encounter (Signed)
Steele called and spoke to [facility], Merchant navy officer about the refill(s) Gabapentin requested. Advised it was sent on 01/26/21 #360/4 refill(s). She says it was filled on yesterday and waiting for pickup. Patient called, left detailed message per DPR of the above and to call the office back if there are questions.

## 2021-02-28 NOTE — Telephone Encounter (Signed)
Requested medication (s) are due for refill today: yes  Requested medication (s) are on the active medication list: yes  Last refill:  01/29/21  Future visit scheduled: yes  Notes to clinic:  Unable to refill per protocol, cannot delegate.      Requested Prescriptions  Pending Prescriptions Disp Refills   oxyCODONE-acetaminophen (PERCOCET) 10-325 MG tablet 180 tablet 0    Sig: One tablet every 4-6 hours as needed.     Not Delegated - Analgesics:  Opioid Agonist Combinations Failed - 02/28/2021  1:32 PM      Failed - This refill cannot be delegated      Failed - Urine Drug Screen completed in last 360 days      Passed - Valid encounter within last 3 months    Recent Outpatient Visits           1 month ago Type 2 diabetes mellitus with microalbuminuria, without long-term current use of insulin [facility])   [facility] Birdie Sons, MD   5 months ago Type 2 diabetes mellitus with microalbuminuria, without long-term current use of insulin [facility])   [facility] Birdie Sons, MD   8 months ago [place], Donald E, MD   9 months ago History of CVA in adulthood   [place], Kirstie Peri, MD   1 year ago [place], Kirstie Peri, MD

## 2021-02-28 NOTE — Telephone Encounter (Signed)
Copied from [place] [id number]. Topic: Quick Communication - Rx Refill/Question >> Feb 28, 2021  9:50 AM Tessa Lerner A wrote: Medication: oxyCODONE-acetaminophen (PERCOCET) 10-325 MG tablet [[id number]]   Has the patient contacted their pharmacy? No. (Agent: If no, request that the patient contact the pharmacy for the refill. If patient does not wish to contact the pharmacy document the reason why and proceed with request.) (Agent: If yes, when and what did the pharmacy advise?)  Preferred Pharmacy (with phone number or street name): [facility] (N), [place] - [address] [place] ([place]) [place] [address] Phone: [phone] Fax: [phone]   Has the patient been seen for an appointment in the last year OR does the patient have an upcoming appointment? Yes.    Agent: Please be advised that RX refills may take up to 3 business days. We ask that you follow-up with your pharmacy.

## 2021-02-28 NOTE — Telephone Encounter (Signed)
Pt called reporting that she is completely out and is hoping to receive them soon

## 2021-03-01 MED ORDER — OXYCODONE-ACETAMINOPHEN 10-325 MG PO TABS: ORAL_TABLET | ORAL | 0 refills | Status: DC

## 2021-03-05 DIAGNOSIS — R4182 Altered mental status, unspecified: Secondary | ICD-10-CM | POA: Diagnosis not present

## 2021-03-09 ENCOUNTER — Inpatient Hospital Stay: Payer: Medicare HMO | Admitting: Family Medicine

## 2021-03-09 NOTE — Progress Notes (Deleted)
Established patient visit   Patient: Yvette Riley   DOB: 1955-11-16   66 y.o. Female  MRN: [id number] Visit Date: 03/09/2021  Today's healthcare provider: Lelon Huh, MD   No chief complaint on file.  Subjective    HPI  Follow up Hospitalization  Patient was admitted to [facility] on 02/24/2021 and discharged on 02/27/2021. She was treated for Toxic metabolic encephalopathy, Altered mental status and blurred vision. Treatment for this included -see discharge notes. Telephone follow up was attempted on 02/28/2021, but patient did not return phone call. She reports {excellent/good/fair:19992} compliance with treatment. She reports this condition is {resolved/improved/worsened:23923}.  ----------------------------------------------------------------------------------------- -   Medications: Outpatient Medications Prior to Visit  Medication Sig   albuterol (VENTOLIN HFA) 108 (90 Base) MCG/ACT inhaler Inhale 2 puffs into the lungs every 6 (six) hours as needed for wheezing or shortness of breath.   Alirocumab (PRALUENT) 150 MG/ML SOAJ Inject 1 mL into the skin every 14 (fourteen) days.   ANORO ELLIPTA 62.5-25 MCG/INH AEPB Inhale 1 puff into the lungs daily.   aspirin 81 MG EC tablet Take 1 tablet (81 mg total) by mouth daily. Swallow whole.   benzonatate (TESSALON) 100 MG capsule Take 1 capsule by mouth three times daily as needed for cough (Patient not taking: Reported on 01/26/2021)   Blood Glucose Monitoring Suppl (ONE TOUCH ULTRA 2) w/Device KIT Use to check sugar daily for type 2 diabetes E11.9   clonazePAM (KLONOPIN) 0.5 MG tablet Take 1 tablet (0.5 mg total) by mouth 2 (two) times daily as needed.   clopidogrel (PLAVIX) 75 MG tablet Take 1 tablet (75 mg total) by mouth daily.   ezetimibe (ZETIA) 10 MG tablet Take 1 tablet by mouth once daily   fenofibrate 160 MG tablet Take 1 tablet (160 mg total) by mouth daily.   gabapentin (NEURONTIN) 600 MG tablet Take one tablet up  to 4 times a day   glucose blood (ONETOUCH ULTRA) test strip Use to check blood sugar daily for type 2 diabetes E11.9   Iron, Ferrous Sulfate, 325 (65 Fe) MG TABS Take 325 mg by mouth daily.   Lancets (ONETOUCH ULTRASOFT) lancets Use to check sugar daily for type 2 diabetes E11.9   lisinopril (ZESTRIL) 10 MG tablet Take 10 mg by mouth daily.   meloxicam (MOBIC) 15 MG tablet TAKE 1 TABLET BY MOUTH ONCE DAILY AS NEEDED FOR PAIN   metFORMIN (GLUCOPHAGE-XR) 500 MG 24 hr tablet Take 1 tablet (500 mg total) by mouth 2 (two) times daily with a meal.   metoprolol succinate (TOPROL-XL) 25 MG 24 hr tablet Take 1 tablet (25 mg total) by mouth daily.   montelukast (SINGULAIR) 10 MG tablet Take 1 tablet (10 mg total) by mouth at bedtime.   NARCAN 4 MG/0.1ML LIQD nasal spray kit Place 0.4 mg into the nose once.    ondansetron (ZOFRAN ODT) 4 MG disintegrating tablet Take 1 tablet (4 mg total) by mouth every 8 (eight) hours as needed for nausea or vomiting.   oxyCODONE-acetaminophen (PERCOCET) 10-325 MG tablet One tablet every 4-6 hours as needed.   sertraline (ZOLOFT) 50 MG tablet Take 1 tablet (50 mg total) by mouth daily.   sucralfate (CARAFATE) 1 g tablet TAKE 1 TABLET BY MOUTH 4 TIMES DAILY (WITH MEALS AND AT BEDTIME)   Tiotropium Bromide-Olodaterol 2.5-2.5 MCG/ACT AERS Inhale 2 puffs into the lungs daily.   zaleplon (SONATA) 10 MG capsule TAKE 1 CAPSULE BY MOUTH AT BEDTIME. MAY TAKE SECOND CAPSULE AFTER  2 HOURS IF NEEDED   No facility-administered medications prior to visit.    Review of Systems  {Labs   Heme   Chem   Endocrine   Serology   Results Review (optional):23779}   Objective    There were no vitals taken for this visit. {Show previous vital signs (optional):23777}  Physical Exam  ***  No results found for any visits on 03/09/21.  Assessment & Plan     ***  No follow-ups on file.      {provider attestation***:1}   Lelon Huh, MD  [facility] [phone]  (phone) [phone] (fax)  [place]

## 2021-03-13 DIAGNOSIS — I10 Essential (primary) hypertension: Secondary | ICD-10-CM | POA: Diagnosis not present

## 2021-03-13 DIAGNOSIS — Z743 Need for continuous supervision: Secondary | ICD-10-CM | POA: Diagnosis not present

## 2021-03-13 DIAGNOSIS — Z5321 Procedure and treatment not carried out due to patient leaving prior to being seen by health care provider: Secondary | ICD-10-CM | POA: Diagnosis not present

## 2021-03-13 DIAGNOSIS — R110 Nausea: Secondary | ICD-10-CM | POA: Diagnosis not present

## 2021-03-13 DIAGNOSIS — R1110 Vomiting, unspecified: Secondary | ICD-10-CM | POA: Diagnosis not present

## 2021-03-13 DIAGNOSIS — R5381 Other malaise: Secondary | ICD-10-CM | POA: Diagnosis not present

## 2021-03-16 ENCOUNTER — Inpatient Hospital Stay: Payer: Medicare HMO | Admitting: Family Medicine

## 2021-03-16 ENCOUNTER — Telehealth: Payer: Self-pay

## 2021-03-16 NOTE — Telephone Encounter (Signed)
Copied from [place] [id number]. Topic: General - Other >> Mar 16, 2021  2:20 PM Valere Dross wrote: Reason for CRM: Pt called in stating she is needing someone to reach out to her insurance [employer] letting them know he is in the network. Please advise.

## 2021-03-19 ENCOUNTER — Inpatient Hospital Stay: Payer: Medicare HMO | Admitting: Family Medicine

## 2021-03-19 ENCOUNTER — Ambulatory Visit: Admission: RE | Admit: 2021-03-19 | Payer: Medicare HMO | Source: Ambulatory Visit

## 2021-03-19 NOTE — Progress Notes (Unsigned)
[place]  [phone] [phone]) [phone]  ID: VERGINIA TOOHEY OB: 10/05/1955  MR#: [id number]  QJF#:[id number]  Patient Care Team: Birdie Sons, MD as PCP - General (Family Medicine) Lorelee Cover., MD (Ophthalmology) Vladimir Crofts, MD as Consulting Physician (Neurology) Dionisio David, MD as Consulting Physician (Cardiology) Lloyd Huger, MD as Consulting Physician (Oncology) Noreene Filbert, MD as Referring Physician (Radiation Oncology)  CHIEF COMPLAINT: Clinical stage IIIB small cell lung cancer of the right lung.  INTERVAL HISTORY: Patient returns to clinic today for routine yearly evaluation and discussion of her imaging results.  She continues to have residual neurologic effects from a CVA several years ago, but otherwise feels well.  She continues to have chronic shortness of breath and weakness and fatigue.  She denies any recent fevers or illnesses.  She has a good appetite and denies weight loss.  She denies any chest pain, cough, or hemoptysis.  She denies any nausea, vomiting, constipation, or diarrhea.  She denies any melena or hematochezia.  She has no urinary complaints.  Patient offers no further specific complaints today.  REVIEW OF SYSTEMS:   Review of Systems  Constitutional:  Positive for malaise/fatigue. Negative for fever and weight loss.  Respiratory:  Positive for shortness of breath. Negative for cough and hemoptysis.   Cardiovascular: Negative.  Negative for chest pain and leg swelling.  Gastrointestinal: Negative.  Negative for abdominal pain and constipation.  Genitourinary: Negative.  Negative for dysuria.  Musculoskeletal: Negative.  Negative for back pain and myalgias.  Skin: Negative.  Negative for rash.  Neurological:  Positive for focal weakness and weakness. Negative for sensory change and headaches.  Psychiatric/Behavioral: Negative.  The patient is not nervous/anxious.    As per HPI. Otherwise, a  complete review of systems is negative.  PAST MEDICAL HISTORY: Past Medical History:  Diagnosis Date   Allergy    Anemia    Anxiety    Arthritis    Asthma    Blood transfusion without reported diagnosis    C. difficile diarrhea 04/07/2016   CAP (community acquired pneumonia) 03/21/2015   Combined forms of age-related cataract of both eyes 05/06/2016   COPD (chronic obstructive pulmonary disease) (HCC)    CVA (cerebral vascular accident) ([place]) 06/17/2018   Diabetes mellitus without complication (HCC)    Displacement of lumbar intervertebral disc without myelopathy    Emphysema of lung (HCC)    GERD (gastroesophageal reflux disease)    Glaucoma    History of chicken pox    Hyperlipidemia    Hypertension    Pneumonia 03/29/2015   Sepsis ([place]) 03/17/2016   Shortness of breath    Small cell lung cancer ([place])     PAST SURGICAL HISTORY: Past Surgical History:  Procedure Laterality Date   CESAREAN SECTION     TUBAL LIGATION     UPPER GI ENDOSCOPY      FAMILY HISTORY: Family History  Problem Relation Age of Onset   Heart failure Mother    Heart disease Mother    Stroke Mother    Heart disease Brother    COPD Brother    Cancer Maternal Aunt        Breast Cancer    ADVANCED DIRECTIVES (Y/N):  N  HEALTH MAINTENANCE: Social History   Tobacco Use   Smoking status: Former    Packs/day: 0.25    Years: 45.00    Pack years: 11.25    Types: Cigarettes    Quit date: 03/17/2016  Years since quitting: 5.0   Smokeless tobacco: Never   Tobacco comments:    pt states she quit in 2016-2017  Vaping Use   Vaping Use: Never used  Substance Use Topics   Alcohol use: No    Alcohol/week: 0.0 standard drinks   Drug use: No     Colonoscopy:  PAP:  Bone density:  Lipid panel:  Allergies  Allergen Reactions   Codeine Anaphylaxis   Other Anaphylaxis   Penicillins Anaphylaxis, Rash and Other (See Comments)    Reaction:  Tongue swelling  Has patient had a PCN reaction causing  immediate rash, facial/tongue/throat swelling, SOB or lightheadedness with hypotension:  Yes   Has patient had a PCN reaction causing severe rash involving mucus membranes or skin necrosis: No Has patient had a PCN reaction that required hospitalization No Has patient had a PCN reaction occurring within the last 10 years: No If all of the above answers are "NO", then may proceed with Cephalosporin use.   Yellow Jacket Venom [Bee Venom] Anaphylaxis   Crestor [Rosuvastatin] Nausea And Vomiting    Corrected prior adverse reaction per [facility] [employer]. On 12/02/3011  patient reported N & V when she takes Crestor   Gemfibrozil Rash, Nausea And Vomiting and Swelling   Trazodone And Nefazodone Nausea And Vomiting   Lipitor [Atorvastatin] Nausea Only    By patient report 12/02/2011. Had also been prescribed pravastatin and lovastatin by previous MD. Unclear if those caused same side effects.     Current Outpatient Medications  Medication Sig Dispense Refill   albuterol (VENTOLIN HFA) 108 (90 Base) MCG/ACT inhaler Inhale 2 puffs into the lungs every 6 (six) hours as needed for wheezing or shortness of breath. 18 g 3   Alirocumab (PRALUENT) 150 MG/ML SOAJ Inject 1 mL into the skin every 14 (fourteen) days. 2 mL 12   ANORO ELLIPTA 62.5-25 MCG/INH AEPB Inhale 1 puff into the lungs daily. 60 each 5   aspirin 81 MG EC tablet Take 1 tablet (81 mg total) by mouth daily. Swallow whole.     benzonatate (TESSALON) 100 MG capsule Take 1 capsule by mouth three times daily as needed for cough (Patient not taking: Reported on 01/26/2021) 60 capsule 5   Blood Glucose Monitoring Suppl (ONE TOUCH ULTRA 2) w/Device KIT Use to check sugar daily for type 2 diabetes E11.9 1 kit 0   clonazePAM (KLONOPIN) 0.5 MG tablet Take 1 tablet (0.5 mg total) by mouth 2 (two) times daily as needed. 60 tablet 3   clopidogrel (PLAVIX) 75 MG tablet Take 1 tablet (75 mg total) by mouth daily. 90 tablet 1   ezetimibe (ZETIA) 10 MG tablet  Take 1 tablet by mouth once daily 90 tablet 4   fenofibrate 160 MG tablet Take 1 tablet (160 mg total) by mouth daily. 90 tablet 0   gabapentin (NEURONTIN) 600 MG tablet Take one tablet up to 4 times a day 360 tablet 4   glucose blood (ONETOUCH ULTRA) test strip Use to check blood sugar daily for type 2 diabetes E11.9 100 strip 4   Iron, Ferrous Sulfate, 325 (65 Fe) MG TABS Take 325 mg by mouth daily. 100 tablet 3   Lancets (ONETOUCH ULTRASOFT) lancets Use to check sugar daily for type 2 diabetes E11.9 100 each 4   lisinopril (ZESTRIL) 10 MG tablet Take 10 mg by mouth daily.     meloxicam (MOBIC) 15 MG tablet TAKE 1 TABLET BY MOUTH ONCE DAILY AS NEEDED FOR PAIN 90 tablet  3   metFORMIN (GLUCOPHAGE-XR) 500 MG 24 hr tablet Take 1 tablet (500 mg total) by mouth 2 (two) times daily with a meal. 180 tablet 4   metoprolol succinate (TOPROL-XL) 25 MG 24 hr tablet Take 1 tablet (25 mg total) by mouth daily. 90 tablet 2   montelukast (SINGULAIR) 10 MG tablet Take 1 tablet (10 mg total) by mouth at bedtime. 90 tablet 3   NARCAN 4 MG/0.1ML LIQD nasal spray kit Place 0.4 mg into the nose once.   99   ondansetron (ZOFRAN ODT) 4 MG disintegrating tablet Take 1 tablet (4 mg total) by mouth every 8 (eight) hours as needed for nausea or vomiting. 20 tablet 0   oxyCODONE-acetaminophen (PERCOCET) 10-325 MG tablet One tablet every 4-6 hours as needed. 180 tablet 0   sertraline (ZOLOFT) 50 MG tablet Take 1 tablet (50 mg total) by mouth daily. 90 tablet 3   sucralfate (CARAFATE) 1 g tablet TAKE 1 TABLET BY MOUTH 4 TIMES DAILY (WITH MEALS AND AT BEDTIME) 360 tablet 4   Tiotropium Bromide-Olodaterol 2.5-2.5 MCG/ACT AERS Inhale 2 puffs into the lungs daily. 4 g 3   zaleplon (SONATA) 10 MG capsule TAKE 1 CAPSULE BY MOUTH AT BEDTIME. MAY TAKE SECOND CAPSULE AFTER 2 HOURS IF NEEDED 30 capsule 5   No current facility-administered medications for this visit.    OBJECTIVE: There were no vitals filed for this visit.    There  is no height or weight on file to calculate BMI.    ECOG FS:1 - Symptomatic but completely ambulatory  General: Well-developed, well-nourished, no acute distress.  Sitting in a wheelchair. Eyes: Pink conjunctiva, anicteric sclera. HEENT: Normocephalic, moist mucous membranes. Lungs: No audible wheezing or coughing. Heart: Regular rate and rhythm. Abdomen: Soft, nontender, no obvious distention. Musculoskeletal: No edema, cyanosis, or clubbing. Neuro: Alert, answering all questions appropriately. Cranial nerves grossly intact. Skin: No rashes or petechiae noted. Psych: Normal affect.   LAB RESULTS:  Lab Results  Component Value Date   NA 140 06/19/2020   K 4.2 06/19/2020   CL 99 06/19/2020   CO2 24 06/19/2020   GLUCOSE 128 (H) 06/19/2020   BUN 11 06/19/2020   CREATININE 0.56 (L) 06/19/2020   CALCIUM 9.2 06/19/2020   PROT 7.3 06/19/2020   ALBUMIN 4.9 (H) 06/19/2020   AST 27 06/19/2020   ALT 11 06/19/2020   ALKPHOS 102 06/19/2020   BILITOT 0.5 06/19/2020   GFRNONAA 89 09/07/2019   GFRAA 102 09/07/2019    Lab Results  Component Value Date   WBC 9.4 06/19/2020   NEUTROABS 7.2 06/17/2018   HGB 12.4 06/19/2020   HCT 36.8 06/19/2020   MCV 101 (H) 06/19/2020   PLT 326 06/19/2020     STUDIES: No results found.   ASSESSMENT: Clinical stage IIIB small cell lung cancer of the right lung.  PLAN:    1. Clinical stage IIIB small cell lung cancer of the right lung: Patient completed concurrent chemotherapy and XRT at outside facility and then transferred care to [facility] to complete treatment and long-term monitoring for recurrence. She completed cycle 6 of carboplatinum and etoposide on August 15, 2016.  She also completed PCI.  Her most recent imaging with CT scan on February 2022 reviewed independently and report as above with no obvious evidence of recurrent or progressive disease.  Continue follow-up and imaging yearly until patient is 5 years removed from completing her  treatment in July 2023.  Return to clinic in 1 year with repeat CT  scan and further evaluation. 2.  Cough/shortness of breath: Chronic and unchanged.  Continue follow-up with pulmonology and cardiology as scheduled. 3.  Back pain: Chronic and unchanged.  Patient does not complain of this today.    Patient expressed understanding and was in agreement with this plan. She also understands that She can call clinic at any time with any questions, concerns, or complaints.    Cancer Staging  Small cell lung cancer, right [facility]) Staging form: Lung, AJCC 8th Edition - Clinical stage from 08/18/2016: Stage IIIB (cT2a, cN3, cM0) - Signed by Lloyd Huger, MD on 08/18/2016 Laterality: Right   Lloyd Huger, MD   03/19/2021 8:44 AM

## 2021-03-20 NOTE — Telephone Encounter (Signed)
Patient advised that he is in the [employer]

## 2021-03-22 ENCOUNTER — Inpatient Hospital Stay: Payer: Medicare HMO | Admitting: Oncology

## 2021-03-22 DIAGNOSIS — C3491 Malignant neoplasm of unspecified part of right bronchus or lung: Secondary | ICD-10-CM

## 2021-03-29 ENCOUNTER — Other Ambulatory Visit: Payer: Self-pay | Admitting: Family Medicine

## 2021-03-29 ENCOUNTER — Telehealth: Payer: Self-pay

## 2021-03-29 DIAGNOSIS — M5126 Other intervertebral disc displacement, lumbar region: Secondary | ICD-10-CM

## 2021-03-29 DIAGNOSIS — C3491 Malignant neoplasm of unspecified part of right bronchus or lung: Secondary | ICD-10-CM

## 2021-03-29 DIAGNOSIS — R809 Proteinuria, unspecified: Secondary | ICD-10-CM

## 2021-03-29 MED ORDER — GABAPENTIN 600 MG PO TABS: ORAL_TABLET | ORAL | 2 refills | Status: DC

## 2021-03-29 NOTE — Telephone Encounter (Signed)
Copied from [place] [phone]. Topic: General - Other ?>> Mar 29, 2021 12:30 PM Yvette Riley wrote: ?Reason for CRM: Pt called in stating she is needing to speak with someone about getting some transportation to her upcoming appt, pt requested a call back. Please advise. ?

## 2021-03-29 NOTE — Telephone Encounter (Signed)
Requested Prescriptions  ?Pending Prescriptions Disp Refills  ?? gabapentin (NEURONTIN) 600 MG tablet 360 tablet 4  ?  Sig: Take one tablet up to 4 times a day  ?  ? Neurology: Anticonvulsants - gabapentin Failed - 03/29/2021 11:51 AM  ?  ?  Failed - Cr in normal range and within 360 days  ?  Creatinine  ?Date Value Ref Range Status  ?02/02/2012 0.68 0.60 - 1.30 mg/dL Final  ? ?Creatinine, Ser  ?Date Value Ref Range Status  ?06/19/2020 0.56 (L) 0.57 - 1.00 mg/dL Final  ?   ?  ?  Passed - Completed PHQ-2 or PHQ-9 in the last 360 days  ?  ?  Passed - Valid encounter within last 12 months  ?  Recent Outpatient Visits   ?      ? 2 months ago Type 2 diabetes mellitus with microalbuminuria, without long-term current use of insulin ([place])  ? [facility] Birdie Sons, MD  ? 6 months ago Type 2 diabetes mellitus with microalbuminuria, without long-term current use of insulin ([place])  ? [facility] Caryn Section, Kirstie Peri, MD  ? 9 months ago Ataxia  ? [facility] Birdie Sons, MD  ? 10 months ago History of CVA in adulthood  ? [facility] Caryn Section, Kirstie Peri, MD  ? 1 year ago Anxiety  ? [facility] Caryn Section, Kirstie Peri, MD  ?  ?  ?Future Appointments   ?        ? In 3 weeks Fisher, Kirstie Peri, MD [facility]  ?  ? ?  ?  ?  ? ? ?

## 2021-03-29 NOTE — Telephone Encounter (Signed)
Medication Refill - Medication: gabapentin (NEURONTIN) 600 MG tablet ? ?Has the patient contacted their pharmacy? No. Pt stated she has not contacted the pharmacy.  ? ?(Agent: If no, request that the patient contact the pharmacy for the refill. If patient does not wish to contact the pharmacy document the reason why and proceed with request.) ? ? ?Preferred Pharmacy (with phone number or street name):  ?[facility] ([facility] [place] [address] [place]  ?[place] ([place]) [place] [address]  ?Phone: [phone] Fax: [phone]  ?Hours: Not open 24 hours  ? ?Has the patient been seen for an appointment in the last year OR does the patient have an upcoming appointment? Yes.   ? ?Agent: Please be advised that RX refills may take up to 3 business days. We ask that you follow-up with your pharmacy.  ?

## 2021-03-29 NOTE — Telephone Encounter (Signed)
Please advise whether we have any referral resources for transportation services.  ?

## 2021-04-02 ENCOUNTER — Telehealth: Payer: Self-pay | Admitting: Family Medicine

## 2021-04-02 NOTE — Telephone Encounter (Signed)
[facility] called and stated that the pt now would like them as her preferred pharmacy and would like all medications to be transferred/ sent to them / they advised they have sent a fax request / please advise  ?

## 2021-04-02 NOTE — Telephone Encounter (Signed)
Have you receive a request? ?

## 2021-04-03 ENCOUNTER — Other Ambulatory Visit: Payer: Self-pay | Admitting: Family Medicine

## 2021-04-03 ENCOUNTER — Telehealth: Payer: Self-pay

## 2021-04-03 DIAGNOSIS — F5101 Primary insomnia: Secondary | ICD-10-CM

## 2021-04-03 DIAGNOSIS — M5442 Lumbago with sciatica, left side: Secondary | ICD-10-CM

## 2021-04-03 DIAGNOSIS — G8929 Other chronic pain: Secondary | ICD-10-CM

## 2021-04-03 NOTE — Telephone Encounter (Signed)
Copied from [place] [phone]. Topic: Quick Communication - Rx Refill/Question ?>> Apr 03, 2021  1:21 PM Yvette Rack wrote: ?Medication: oxyCODONE-acetaminophen (PERCOCET) 10-325 MG tablet and zaleplon (SONATA) 10 MG capsule ? ?Has the patient contacted their pharmacy? No. Pt previously told to contact provider ?(Agent: If no, request that the patient contact the pharmacy for the refill. If patient does not wish to contact the pharmacy document the reason why and proceed with request.) ?(Agent: If yes, when and what did the pharmacy advise?) ? ?Preferred Pharmacy (with phone number or street name): [facility] [place]  ?Phone: [phone] ?Fax: [phone] ? ?Has the patient been seen for an appointment in the last year OR does the patient have an upcoming appointment? Yes.   ? ?Agent: Please be advised that RX refills may take up to 3 business days. We ask that you follow-up with your pharmacy. ?

## 2021-04-03 NOTE — Telephone Encounter (Signed)
Copied from [place] [id number]. Topic: General - Other ?>> Apr 03, 2021  1:28 PM Yvette Rack wrote: ?Reason for CRM: Pt having issues with transportation and would like to ask Dr. Caryn Section if the hospital fu appt on 04/23/21 can be done by phone ?

## 2021-04-04 ENCOUNTER — Telehealth: Payer: Self-pay

## 2021-04-04 ENCOUNTER — Other Ambulatory Visit: Payer: Self-pay

## 2021-04-04 DIAGNOSIS — G8929 Other chronic pain: Secondary | ICD-10-CM

## 2021-04-04 MED ORDER — ZALEPLON 10 MG PO CAPS: ORAL_CAPSULE | ORAL | 5 refills | Status: DC

## 2021-04-04 MED ORDER — OXYCODONE-ACETAMINOPHEN 10-325 MG PO TABS: ORAL_TABLET | ORAL | 0 refills | Status: DC

## 2021-04-04 NOTE — Telephone Encounter (Signed)
Pt states she only has a small window to get to the pharmacy to pick up her medications. ?Pt is asking if Dr Caryn Section can please fill her pain med in the next hour?  She has only 2 pills left and her ride can only take her now, within the next hour. ?

## 2021-04-04 NOTE — Telephone Encounter (Signed)
Patient called back requesting a refill. She states she only has 2-4 pills left and needs a refill sent into the pharmacy. Due to the national shortage, patient is going to call her pharmacy to check and make sure they have it in stock. ?

## 2021-04-04 NOTE — Telephone Encounter (Signed)
That's fine

## 2021-04-04 NOTE — Telephone Encounter (Signed)
Copied from [place] [phone]. Topic: General - Other ?>> Apr 04, 2021 10:28 AM Valere Dross wrote: ?Reason for CRM: Pt called in stating her insurance told her that doc Caryn Section is not on her [place], but she states she has been seeing him for a long time, pt requested if a call could be returned to talk about this and see what's going on, please advise. ?

## 2021-04-04 NOTE — Telephone Encounter (Signed)
Patient advised.

## 2021-04-04 NOTE — Telephone Encounter (Signed)
Requested medication (s) are due for refill today: no ? ?Requested medication (s) are on the active medication list: yes ? ?Last refill:  zaleplon 01/26/21 #30/5, percocet 03/01/21 @180 /0 ? ?Future visit scheduled: yes ? ?Notes to clinic:  Unable to refill per protocol, cannot delegate. ? ? ? ?  ?Requested Prescriptions  ?Pending Prescriptions Disp Refills  ? oxyCODONE-acetaminophen (PERCOCET) 10-325 MG tablet 180 tablet 0  ?  Sig: One tablet every 4-6 hours as needed.  ?  ? Not Delegated - Analgesics:  Opioid Agonist Combinations Failed - 04/03/2021  2:31 PM  ?  ?  Failed - This refill cannot be delegated  ?  ?  Failed - Urine Drug Screen completed in last 360 days  ?  ?  Passed - Valid encounter within last 3 months  ?  Recent Outpatient Visits   ? ?      ? 2 months ago Type 2 diabetes mellitus with microalbuminuria, without long-term current use of insulin ([place])  ? [facility] Birdie Sons, MD  ? 6 months ago Type 2 diabetes mellitus with microalbuminuria, without long-term current use of insulin ([place])  ? [facility] Caryn Section, Kirstie Peri, MD  ? 9 months ago Ataxia  ? [facility] Birdie Sons, MD  ? 11 months ago History of CVA in adulthood  ? [facility] Caryn Section, Kirstie Peri, MD  ? 1 year ago Anxiety  ? [facility] Caryn Section, Kirstie Peri, MD  ? ?  ?  ?Future Appointments   ? ?        ? In 2 weeks Fisher, Kirstie Peri, MD [facility]  ? ?  ? ?  ?  ?  ? zaleplon (SONATA) 10 MG capsule 30 capsule 5  ?  Sig: TAKE 1 CAPSULE BY MOUTH AT BEDTIME. MAY TAKE SECOND CAPSULE AFTER 2 HOURS IF NEEDED  ?  ? Not Delegated - Psychiatry:  Anxiolytics/Hypnotics Failed - 04/03/2021  2:31 PM  ?  ?  Failed - This refill cannot be delegated  ?  ?  Failed - Urine Drug Screen completed in last 360 days  ?  ?  Passed - Valid encounter within last 6 months  ?  Recent Outpatient Visits   ? ?      ? 2 months ago Type 2 diabetes mellitus with microalbuminuria,  without long-term current use of insulin ([place])  ? [facility] Birdie Sons, MD  ? 6 months ago Type 2 diabetes mellitus with microalbuminuria, without long-term current use of insulin ([place])  ? [facility] Caryn Section, Kirstie Peri, MD  ? 9 months ago Ataxia  ? [facility] Birdie Sons, MD  ? 11 months ago History of CVA in adulthood  ? [facility] Caryn Section, Kirstie Peri, MD  ? 1 year ago Anxiety  ? [facility] Caryn Section, Kirstie Peri, MD  ? ?  ?  ?Future Appointments   ? ?        ? In 2 weeks Fisher, Kirstie Peri, MD [facility]  ? ?  ? ?  ?  ?  ? ?

## 2021-04-05 ENCOUNTER — Telehealth: Payer: Self-pay

## 2021-04-05 NOTE — Telephone Encounter (Signed)
? ?  Telephone encounter was:  Successful.  ?04/05/2021 ?Name: Yvette Riley MRN: [id number] DOB: [birth year] ? ?Yvette Riley is a 66 y.o. year old female who is a primary care patient of Fisher, Kirstie Peri, MD . The community resource team was consulted for assistance with Transportation Needs  ? ?Care guide performed the following interventions:  Pt answered: Pt advised she has a walker and able to get in out of vehicle with no support needed. She advised that she was told [employer] did not have transportatiob but that I can double check and find other resources as well. ? ?Follow Up Plan:  Care guide will outreach resources to assist patient with transportation. ? ?Cameron Proud ?Care Guide, Embedded Care Coordination ?[facility]  Care management  ?[place], [place]  ?Main Phone: [phone]  E-mail: [email or web address]  ?Website: [email or web address] ? ? ? ? ?

## 2021-04-05 NOTE — Telephone Encounter (Signed)
I called patient and gave her the number to the community resource agent that called her 1 hour ago regarding transportation assistance. ?

## 2021-04-05 NOTE — Telephone Encounter (Signed)
Spoke with patient to assure her that Dr. Caryn Section does accept [employer] and [employer].

## 2021-04-05 NOTE — Telephone Encounter (Signed)
Pt called again to speak with Arbie Cookey, please advise.  ?

## 2021-04-05 NOTE — Telephone Encounter (Signed)
Patient does not have a ride to her doctors appointments and needs some help.  Can you refer her to a social worker to help her please.

## 2021-04-05 NOTE — Telephone Encounter (Signed)
? ?  Telephone encounter was:  Unsuccessful.  04/05/2021 ?Name: Yvette Riley MRN: [id number] DOB: 15-Feb-1955 ? ?Unsuccessful outbound call made today to assist with:  Transportation Needs  ? ?Outreach Attempt:  1st Attempt ? ?A HIPAA compliant voice message was left requesting a return call.  Instructed patient to call back at [phone] at their earliest convenience. ? ?Cameron Proud ?Care Guide, Embedded Care Coordination ?[facility] Health  Care management  ?[place], [place]  ?Main Phone: [phone]  E-mail: [email or web address]  ?Website: [email or web address] ? ? ? ?

## 2021-04-05 NOTE — Telephone Encounter (Signed)
Pt called back asking for Arbie Cookey to call her back regarding transportation services. ?

## 2021-04-10 ENCOUNTER — Ambulatory Visit: Payer: Self-pay

## 2021-04-10 NOTE — Telephone Encounter (Signed)
?  Chief Complaint: falls ?Symptoms: falls every day and dizziness, increased weakness ?Frequency: 1 week or more ?Pertinent Negatives: Patient denies having any injuries ?Disposition: [] ED /[] Urgent Care (no appt availability in office) / [] Appointment(In office/virtual)/ []  [place] [facility]/ [] Home Care/ [] Refused Recommended Disposition /[] [place] [facility]/ []  Follow-up with PCP ?Additional Notes: Pt has no one to assist her or transportation. Pt states she is to the point where she cant take care of herself and fears going to a nursing home. Advised pt she would need to come in for appt or go to ED and be seen since she fell yesterday and hit her head on the coffee table. Pt refused to go to ED and would like to see if transportation would be set up so she can come in to see Dr. Caryn Section rather than doing a telephone visit. Advised her that I will send this to him and see which way is best to provide care.  ? ? ?Reason for Disposition ? [1] MODERATE weakness (i.e., interferes with work, school, normal activities) AND [2] new-onset or worsening ? ?Answer Assessment - Initial Assessment Questions ?1. MECHANISM: "How did the fall happen?" ?    Weakness of legs  ?3. ONSET: "When did the fall happen?" (e.g., minutes, hours, or days ago) ?    Every day for the last week  ?4. LOCATION: "What part of the body hit the ground?" (e.g., back, buttocks, head, hips, knees, hands, head, stomach) ?    Hit head on coffee table earlier this week  ?5. INJURY: "Did you hurt (injure) yourself when you fell?" If Yes, ask: "What did you injure? Tell me more about this?" (e.g., body area; type of injury; pain severity)" ?    yes ?9. OTHER SYMPTOMS: "Do you have any other symptoms?" (e.g., dizziness, fever, weakness; new onset or worsening).  ?    Dizziness, weakness in legs ? ?Protocols used: Falls and Falling-A-AH ? ?

## 2021-04-11 NOTE — Telephone Encounter (Signed)
I don't know how to help her with transportation. I put in a CCM order last week, they should be calling her to go over available resources. She needs to call 911 if she not is not able to get up ambulate safely  ?

## 2021-04-12 ENCOUNTER — Telehealth: Payer: Self-pay

## 2021-04-12 NOTE — Telephone Encounter (Signed)
I am working on the 3/27 appt as that is the only appt I see in Epic and what pt advised she needed a ride for. ?

## 2021-04-12 NOTE — Telephone Encounter (Signed)
? ?  Telephone encounter was:  Unsuccessful.  04/12/2021 ?Name: Yvette Riley MRN: [id number] DOB: 1955/12/06 ? ?Unsuccessful outbound call made today to assist with:  Transportation Needs  ? ?Outreach Attempt:  1st Attempt ? ?A HIPAA compliant voice message was left requesting a return call.  Instructed patient to call back at [phone] at their earliest convenience. ? ?Cameron Proud ?Care Guide, Embedded Care Coordination ?[facility] Health  Care management  ?[place], [place]  ?Main Phone: [phone]  E-mail: [email or web address]  ?Website: [email or web address] ? ? ?

## 2021-04-12 NOTE — Telephone Encounter (Signed)
Copied from [place] [id number]. Topic: General - Other >> Apr 12, 2021 12:09 PM Erick Blinks wrote: Reason for CRM: Pt says she needs assistance with transportation to her upcoming appt.   Best contact: [phone]

## 2021-04-12 NOTE — Telephone Encounter (Signed)
Attempted to reach pt, left VM to call back. 

## 2021-04-12 NOTE — Telephone Encounter (Signed)
I tried calling patient. Left message to call back. OK for [facility] triage to advise.  ?

## 2021-04-13 ENCOUNTER — Telehealth: Payer: Self-pay

## 2021-04-13 NOTE — Telephone Encounter (Signed)
? ?  Telephone encounter was:  Successful.  ?04/13/2021 ?Name: ZIZA HASTINGS MRN: [id number] DOB: Nov 05, 1955 ? ?LAVADA LANGSAM is a 66 y.o. year old female who is a primary care patient of Fisher, Kirstie Peri, MD . The community resource team was consulted for assistance with Transportation Needs  ? ?Care guide performed the following interventions:  I just talked to transportation and even though they stop taking rides 3/24 they can still send the request to an outside vendor. I will be sending [facility] [employer] [facility] and [facility] the request and they will figure out finances and let me know who will assist on getting the pt . This will be a one-time service. Pt should be established with [employer] after 3/27 appointment and be able to use them for all future rides.  ? ?Follow Up Plan:  Care guide will follow up with patient by phone over the next few days ? ?Cameron Proud ?Care Guide, Embedded Care Coordination ?[facility] Health  Care management  ?[place], [place]  ?Main Phone: [phone]  E-mail: [email or web address]  ?Website: [email or web address] ? ? ? ?

## 2021-04-13 NOTE — Telephone Encounter (Signed)
? ?  Telephone encounter was:  Successful.  ?04/13/2021 ?Name: Yvette Riley MRN: [id number] DOB: 11-23-1955 ? ?Yvette Riley is a 66 y.o. year old female who is a primary care patient of Fisher, Kirstie Peri, MD . The community resource team was consulted for assistance with Transportation Needs  ? ?Care guide performed the following interventions:  Patient called in. She stated that she fell recently and now needs assistance in and out of the vehicle. She is still going to be using her walker. CG will now look for a door to door vendor that will assist pt  . ? ?Follow Up Plan:  Care guide will outreach resources to assist patient with Transportation ? ?Cameron Proud ?Care Guide, Embedded Care Coordination ?[facility] Health  Care management  ?[place], [place]  ?Main Phone: [phone]  E-mail: [email or web address]  ?Website: [email or web address] ? ? ? ?

## 2021-04-16 ENCOUNTER — Telehealth: Payer: Self-pay

## 2021-04-16 NOTE — Telephone Encounter (Signed)
Copied from [place] [id number]. Topic: General - Other ?>> Apr 16, 2021  9:10 AM Tessa Lerner A wrote: ?Reason for CRM: Ja'Mesha with [facility] has called to share that the patient will be able to be transported to their appointment on 04/23/21 ? ?The patient had previously expressed a desire for this visit to be over the phone but that is no longer accurate  ? ?Please contact further if needed ?

## 2021-04-16 NOTE — Telephone Encounter (Signed)
Pt called w/ concerns about her ride to the dr on 3/27. ?I advised pt she will have a ride to her appt next. ?

## 2021-04-20 NOTE — Progress Notes (Signed)
?  ?I,Joseline E Rosas,acting as a scribe for Lelon Huh, MD.,have documented all relevant documentation on the behalf of Lelon Huh, MD,as directed by  Lelon Huh, MD while in the presence of Lelon Huh, MD.  ? ? ?Established patient visit ? ? ?Patient: Yvette Riley   DOB: May 22, 1955   66 y.o. Female  MRN: [id number] ?Visit Date: 04/23/2021 ? ?Today's healthcare provider: Lelon Huh, MD  ? ?Chief Complaint  ?Patient presents with  ? Hospitalization Follow-up  ? ?Subjective  ?  ?HPI  ?Follow up Hospitalization ? ?Patient was admitted to [facility] on 02/24/2021 and discharged on 02/27/2021. ?She was treated for altered mental status. ?Treatment for this included imaging, labs ECG which were unremarkable ?Ruled out new CVA. On neuroimaging.  ? ?She rapidly improved to baseline prior to discharge. Was likely multifactorial and aggravated by sedating medications. Was advised to put clonazepam and sonata on hold when discharged.  ? ?Telephone follow up was done on 02/28/2021 ?She reports good compliance with treatment. ?She reports this condition is unchanged. ? ?Urinary symptoms ? ?She reports new onset dysuria, urinary frequency, and urinary urgency. The current episode started a few days ago and is worsening. Patient states symptoms are moderate in intensity, occurring constantly.   ?Associated symptoms: ?No abdominal pain No back pain  ?No chills No constipation  ?Yes cramping No diarrhea  ?No discharge No fever  ?No hematuria No nausea  ?No vomiting   ? ?---------------------------------------------------------------------------------------  ? ?Her main complaint today is that she continue to have difficulty with her balance and equilibrium when she ambulates. She requires walker and still has frequent near falls. She does have history of known cerebellar CVA.  ? ?Medications: ?Outpatient Medications Prior to Visit  ?Medication Sig  ? albuterol (VENTOLIN HFA) 108 (90 Base) MCG/ACT inhaler Inhale 2 puffs  into the lungs every 6 (six) hours as needed for wheezing or shortness of breath.  ? ANORO ELLIPTA 62.5-25 MCG/INH AEPB Inhale 1 puff into the lungs daily.  ? aspirin 81 MG EC tablet Take 1 tablet (81 mg total) by mouth daily. Swallow whole.  ? Blood Glucose Monitoring Suppl (ONE TOUCH ULTRA 2) w/Device KIT Use to check sugar daily for type 2 diabetes E11.9  ? clonazePAM (KLONOPIN) 0.5 MG tablet Take 1 tablet (0.5 mg total) by mouth 2 (two) times daily as needed.  ? clopidogrel (PLAVIX) 75 MG tablet Take 1 tablet (75 mg total) by mouth daily.  ? ezetimibe (ZETIA) 10 MG tablet Take 1 tablet by mouth once daily  ? fenofibrate 160 MG tablet Take 1 tablet (160 mg total) by mouth daily.  ? gabapentin (NEURONTIN) 600 MG tablet Take one tablet up to 4 times a day  ? glucose blood (ONETOUCH ULTRA) test strip Use to check blood sugar daily for type 2 diabetes E11.9  ? Iron, Ferrous Sulfate, 325 (65 Fe) MG TABS Take 325 mg by mouth daily.  ? Lancets (ONETOUCH ULTRASOFT) lancets Use to check sugar daily for type 2 diabetes E11.9  ? lisinopril (ZESTRIL) 10 MG tablet Take 10 mg by mouth daily.  ? meloxicam (MOBIC) 15 MG tablet TAKE 1 TABLET BY MOUTH ONCE DAILY AS NEEDED FOR PAIN  ? metFORMIN (GLUCOPHAGE-XR) 500 MG 24 hr tablet Take 1 tablet (500 mg total) by mouth 2 (two) times daily with a meal.  ? metoprolol succinate (TOPROL-XL) 25 MG 24 hr tablet Take 1 tablet (25 mg total) by mouth daily.  ? montelukast (SINGULAIR) 10 MG tablet Take 1 tablet (10  mg total) by mouth at bedtime.  ? NARCAN 4 MG/0.1ML LIQD nasal spray kit Place 0.4 mg into the nose once.   ? ondansetron (ZOFRAN ODT) 4 MG disintegrating tablet Take 1 tablet (4 mg total) by mouth every 8 (eight) hours as needed for nausea or vomiting.  ? oxyCODONE-acetaminophen (PERCOCET) 10-325 MG tablet One tablet every 4-6 hours as needed.  ? sertraline (ZOLOFT) 50 MG tablet Take 1 tablet (50 mg total) by mouth daily.  ? sucralfate (CARAFATE) 1 g tablet TAKE 1 TABLET BY MOUTH 4  TIMES DAILY (WITH MEALS AND AT BEDTIME)  ? Tiotropium Bromide-Olodaterol 2.5-2.5 MCG/ACT AERS Inhale 2 puffs into the lungs daily.  ? zaleplon (SONATA) 10 MG capsule TAKE 1 CAPSULE BY MOUTH AT BEDTIME. MAY TAKE SECOND CAPSULE AFTER 2 HOURS IF NEEDED  ? Alirocumab (PRALUENT) 150 MG/ML SOAJ Inject 1 mL into the skin every 14 (fourteen) days.  ? benzonatate (TESSALON) 100 MG capsule Take 1 capsule by mouth three times daily as needed for cough (Patient not taking: Reported on 01/26/2021)  ? ?No facility-administered medications prior to visit.  ? ? ?Review of Systems ? ? ?  Objective  ?  ?BP 135/81 (BP Location: Left Arm, Patient Position: Sitting, Cuff Size: Large)   Pulse 90   Temp 98.5 ?F (36.9 ?C) (Oral)   Resp 16   Ht $R'4\' 10"'QP$  (1.473 m)   Wt 174 lb 9.6 oz (79.2 kg)   SpO2 96%   BMI 36.49 kg/m?  ? ? ?Physical Exam  ? ?General: Appearance:    Mildly obese female in no acute distress  ?Eyes:    PERRL, conjunctiva/corneas clear, EOM's intact       ?Lungs:     Clear to auscultation bilaterally, respirations unlabored  ?Heart:    Normal heart rate. Normal rhythm. No murmurs, rubs, or gallops.    ?MS:   All extremities are intact.    ?Neurologic:   Awake, alert, oriented x 3. Ataxic   ?   ?  ? ?Results for orders placed or performed in visit on 04/23/21  ?POCT urinalysis dipstick  ?Result Value Ref Range  ? Color, UA Yellow   ? Clarity, UA cloudy   ? Glucose, UA Negative Negative  ? Bilirubin, UA Negative   ? Ketones, UA Negative   ? Spec Grav, UA 1.020 1.010 - 1.025  ? Blood, UA Trace   ? pH, UA 6.0 5.0 - 8.0  ? Protein, UA Negative Negative  ? Urobilinogen, UA 0.2 0.2 or 1.0 E.U./dL  ? Nitrite, UA Positive   ? Leukocytes, UA Large (3+) (A) Negative  ? Appearance    ? Odor    ? ? Assessment & Plan  ?  ? ?1. Burning with urination ? ? ?2. Urinary tract infection without hematuria, site unspecified ?- Urine Culture ?- ciprofloxacin (CIPRO) 500 MG tablet; Take 1 tablet (500 mg total) by mouth 2 (two) times daily for  10 days.  Dispense: 20 tablet; Refill: 0 ? ?3. Mild persistent asthma without complication ?refill ?- albuterol (VENTOLIN HFA) 108 (90 Base) MCG/ACT inhaler; Inhale 2 puffs into the lungs every 6 (six) hours as needed for wheezing or shortness of breath.  Dispense: 18 g; Refill: 3 ? ?4. Weakness generalized ? ? ?5. History of cerebellar CVA with residual deficit ? ?- Ambulatory referral to Home Health ? ?refill- clopidogrel (PLAVIX) 75 MG tablet; Take 1 tablet (75 mg total) by mouth daily.  Dispense: 90 tablet; Refill: 4 ? ?6. 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 ?Addressed extensive list of chronic and acute medical problems today requiring 50 minutes reviewing her medical record, counseling patient regarding her conditions and coordination of care.    ?   ? ?The entirety of the information documented in the History of Present Illness, Review of Systems and Physical Exam were personally obtained by me. Portions of this information were initially documented by the CMA and reviewed by me for thoroughness and accuracy.   ? ? ?Lelon Huh, MD  ?[facility] ?[phone] (phone) ?[phone] (fax) ? ?[facility]  ?

## 2021-04-23 ENCOUNTER — Encounter: Payer: Self-pay | Admitting: Family Medicine

## 2021-04-23 ENCOUNTER — Telehealth: Payer: Self-pay

## 2021-04-23 ENCOUNTER — Other Ambulatory Visit: Payer: Self-pay

## 2021-04-23 ENCOUNTER — Ambulatory Visit (INDEPENDENT_AMBULATORY_CARE_PROVIDER_SITE_OTHER): Payer: Medicare HMO | Admitting: Family Medicine

## 2021-04-23 VITALS — BP 135/81 | HR 90 | Temp 98.5°F | Resp 16 | Ht <= 58 in | Wt 174.6 lb

## 2021-04-23 DIAGNOSIS — I69393 Ataxia following cerebral infarction: Secondary | ICD-10-CM | POA: Diagnosis not present

## 2021-04-23 DIAGNOSIS — R531 Weakness: Secondary | ICD-10-CM

## 2021-04-23 DIAGNOSIS — R300 Dysuria: Secondary | ICD-10-CM

## 2021-04-23 DIAGNOSIS — I6930 Unspecified sequelae of cerebral infarction: Secondary | ICD-10-CM

## 2021-04-23 DIAGNOSIS — N390 Urinary tract infection, site not specified: Secondary | ICD-10-CM | POA: Diagnosis not present

## 2021-04-23 DIAGNOSIS — I639 Cerebral infarction, unspecified: Secondary | ICD-10-CM

## 2021-04-23 DIAGNOSIS — J4530 Mild persistent asthma, uncomplicated: Secondary | ICD-10-CM | POA: Diagnosis not present

## 2021-04-23 LAB — POCT URINALYSIS DIPSTICK
Bilirubin, UA: NEGATIVE
Glucose, UA: NEGATIVE
Ketones, UA: NEGATIVE
Nitrite, UA: POSITIVE
Protein, UA: NEGATIVE
Spec Grav, UA: 1.02 (ref 1.010–1.025)
Urobilinogen, UA: 0.2 E.U./dL
pH, UA: 6 (ref 5.0–8.0)

## 2021-04-23 MED ORDER — ALBUTEROL SULFATE HFA 108 (90 BASE) MCG/ACT IN AERS: 2.0000 | INHALATION_SPRAY | Freq: Four times a day (QID) | RESPIRATORY_TRACT | 3 refills | Status: DC | PRN

## 2021-04-23 MED ORDER — CLOPIDOGREL BISULFATE 75 MG PO TABS: 75.0000 mg | ORAL_TABLET | Freq: Every day | ORAL | 4 refills | Status: DC

## 2021-04-23 MED ORDER — CIPROFLOXACIN HCL 500 MG PO TABS: 500.0000 mg | ORAL_TABLET | Freq: Two times a day (BID) | ORAL | 0 refills | Status: AC

## 2021-04-23 NOTE — Telephone Encounter (Signed)
? ?  Telephone encounter was:  Successful.  ?04/23/2021 ?Name: Yvette Riley MRN: [id number] DOB: October 08, 1955 ? ?Yvette Riley is a 66 y.o. year old female who is a primary care patient of Fisher, Kirstie Peri, MD . The community resource team was consulted for assistance with Transportation Needs  ? ?Care guide performed the following interventions:  Pt returned call. She was advised [facility] transportation called her Friday to give her the pick up time for transportation with [place]. Pt stated she did get the call and would be ready at 9am. For all future rides, Pt has been advised I have submitted a transportation application with [facility] and have sent her the contact information by mail. ? ?Follow Up Plan:  Care guide will follow up with patient by phone over the next few days to ensure mail has been received.  ? ?Cameron Proud ?Care Guide, Embedded Care Coordination ?[facility] Health  Care management  ?[place], [place]  ?Main Phone: [phone]  E-mail: [email or web address]  ?Website: [email or web address] ? ? ? ?

## 2021-04-23 NOTE — Telephone Encounter (Signed)
? ?  Telephone encounter was:  Unsuccessful.  04/23/2021 ?Name: Yvette Riley MRN: [id number] DOB: [birth year] ? ?Unsuccessful outbound call made today to assist with:  Transportation Needs  ? ?Outreach Attempt:  1st Attempt ? ?A HIPAA compliant voice message was left requesting a return call.  Instructed patient to call back at [phone] at their earliest convenience. ? ?Cameron Proud ?Care Guide, Embedded Care Coordination ?[facility] Health  Care management  ?[place], [place]  ?Main Phone: [phone]  E-mail: [email or web address]  ?Website: [email or web address] ? ? ?

## 2021-04-25 LAB — URINE CULTURE

## 2021-04-30 ENCOUNTER — Telehealth: Payer: Self-pay

## 2021-04-30 NOTE — Telephone Encounter (Signed)
Telephone encounter was:  Successful.  ?04/30/2021 ?Name: JAMILEE LAFOSSE     MRN: [id number]       DOB: 1955/11/04 ?  ?KENIESHA ADDERLY is a 66 y.o. year old female who is a primary care patient of Fisher, Kirstie Peri, MD . The community resource team was consulted for assistance with Transportation Needs  ?  ?Care guide performed the following interventions:  Patient advised she received packet in the mail. At this time, she does not have any further questions. Pt did advise, she needs assistance in filling out transportation paper. [facility] and nurse will come out on 05/05/2021. Pt needs to request for a sw to be assigned as well when nurse comes per advice given to me by CG SW.  SW will assist with filling out transportation paper. Pt does not have any transportation needs at this time, she is just needing to establish a vendor. ?  ?Follow Up Plan:  No further follow up planned at this time. The patient has been provided with needed resources. ?  ?Cameron Proud ?Care Guide, Embedded Care Coordination ?[facility] Health  Care management  ?[place], [place]  ?Main Phone: [phone]  E-mail: [email or web address]  ?Website: [email or web address] ?  ?  ?

## 2021-04-30 NOTE — Telephone Encounter (Signed)
? ?  Telephone encounter was:  Successful.  ?04/30/2021 ?Name: NEESHA LANGTON MRN: [id number] DOB: 02-Feb-1955 ? ?TASHEEMA PERRONE is a 66 y.o. year old female who is a primary care patient of Fisher, Kirstie Peri, MD . The community resource team was consulted for assistance with Transportation Needs  ? ?Care guide performed the following interventions:  Patient advised she received packet in the mail. At this time, she does not have any further questions. Pt did advise, she needs assistance in feeling out transportation paper. [facility] and nurse will come out on 05/05/2021. Pt needs to request for a sw to be assigned as well when nurse comes per advice given to me by CG SW.  SW will assist with filling out transportation paper. Pt does not have any transportation needs at this time, she is just needing to establish a vendor. ? ?Follow Up Plan:  No further follow up planned at this time. The patient has been provided with needed resources. ? ?Cameron Proud ?Care Guide, Embedded Care Coordination ?[facility] Health  Care management  ?[place], [place]  ?Main Phone: [phone]  E-mail: [email or web address]  ?Website: [email or web address] ? ? ? ?

## 2021-05-03 ENCOUNTER — Ambulatory Visit: Payer: Self-pay

## 2021-05-03 NOTE — Telephone Encounter (Signed)
3rd attempt, pt called. Left VM to call office back to discuss with a nurse if she still needed assistance. ? ?Summary: PT calling re labs clarify  ?  Pt called in about her lab results, the results were already given, Pt said the she received a call and that something was wrong and she needed to call dr back and make an appt. Needs results to be re stated as she thinks she needs to make an appt since something is wrong ? Pls clarify with pt, seems confused. [phone]  ?  ? ?

## 2021-05-03 NOTE — Telephone Encounter (Signed)
Pt advised of results again and states she wants to drink tea.  Advised to drink plenty of water instead and maybe a little tea if she has to.  States she needs appt / can't see out of rt eye and balance problem/ "falling all the time." ?Pt scheduled for next Mon 05-07-21.    ?

## 2021-05-03 NOTE — Telephone Encounter (Signed)
Patient called, left VM to return the call to the office to discuss results with a nurse. ? ?Summary: PT calling re labs clarify  ? Pt called in about her lab results, the results were already given, Pt said the she received a call and that something was wrong and she needed to call dr back and make an appt. Needs results to be re stated as she thinks she needs to make an appt since something is wrong ? Pls clarify with pt, seems confused. [phone]  ?  ? ?

## 2021-05-03 NOTE — Telephone Encounter (Signed)
2nd attempt, LVMTCB to discuss with a nurse ?

## 2021-05-05 DIAGNOSIS — E119 Type 2 diabetes mellitus without complications: Secondary | ICD-10-CM | POA: Diagnosis not present

## 2021-05-05 DIAGNOSIS — Z9181 History of falling: Secondary | ICD-10-CM | POA: Diagnosis not present

## 2021-05-05 DIAGNOSIS — Z7902 Long term (current) use of antithrombotics/antiplatelets: Secondary | ICD-10-CM | POA: Diagnosis not present

## 2021-05-05 DIAGNOSIS — Z7982 Long term (current) use of aspirin: Secondary | ICD-10-CM | POA: Diagnosis not present

## 2021-05-05 DIAGNOSIS — N390 Urinary tract infection, site not specified: Secondary | ICD-10-CM | POA: Diagnosis not present

## 2021-05-05 DIAGNOSIS — Z792 Long term (current) use of antibiotics: Secondary | ICD-10-CM | POA: Diagnosis not present

## 2021-05-05 DIAGNOSIS — I69351 Hemiplegia and hemiparesis following cerebral infarction affecting right dominant side: Secondary | ICD-10-CM | POA: Diagnosis not present

## 2021-05-05 DIAGNOSIS — Z7984 Long term (current) use of oral hypoglycemic drugs: Secondary | ICD-10-CM | POA: Diagnosis not present

## 2021-05-05 DIAGNOSIS — E785 Hyperlipidemia, unspecified: Secondary | ICD-10-CM | POA: Diagnosis not present

## 2021-05-05 DIAGNOSIS — J4530 Mild persistent asthma, uncomplicated: Secondary | ICD-10-CM | POA: Diagnosis not present

## 2021-05-05 DIAGNOSIS — I69393 Ataxia following cerebral infarction: Secondary | ICD-10-CM | POA: Diagnosis not present

## 2021-05-05 DIAGNOSIS — I10 Essential (primary) hypertension: Secondary | ICD-10-CM | POA: Diagnosis not present

## 2021-05-07 ENCOUNTER — Other Ambulatory Visit: Payer: Self-pay | Admitting: Family Medicine

## 2021-05-07 ENCOUNTER — Telehealth: Payer: Self-pay | Admitting: Family Medicine

## 2021-05-07 ENCOUNTER — Ambulatory Visit: Payer: Medicare HMO | Admitting: Family Medicine

## 2021-05-07 ENCOUNTER — Telehealth: Payer: Self-pay

## 2021-05-07 DIAGNOSIS — G8929 Other chronic pain: Secondary | ICD-10-CM

## 2021-05-07 DIAGNOSIS — J4530 Mild persistent asthma, uncomplicated: Secondary | ICD-10-CM

## 2021-05-07 DIAGNOSIS — E1129 Type 2 diabetes mellitus with other diabetic kidney complication: Secondary | ICD-10-CM

## 2021-05-07 NOTE — Telephone Encounter (Signed)
Verbal orders given  

## 2021-05-07 NOTE — Telephone Encounter (Signed)
Copied from [place]. Topic: Quick Communication - Home Health Verbal Orders ?>> May 07, 2021 11:34 AM Yvette Rack wrote: ?Caller/Agency: Shalini with [facility] ?Callback Number: [phone] ?Requesting OT/PT/Skilled Nursing/Social Work/Speech Therapy: PT ?Frequency: 1 time a week for 9 weeks and OT evaluation ?

## 2021-05-07 NOTE — Telephone Encounter (Signed)
Copied from [place] [phone]. Topic: General - Call Back - No Documentation ?>> May 07, 2021 12:55 PM Erick Blinks wrote: ?Reason for CRM: Mercie Eon from [facility] called to see if a fax was received today regarding the patient's Rxs. They are requesting refills of:  ? ?metFORMIN (GLUCOPHAGE-XR) 500 MG 24 hr tablet ?albuterol (VENTOLIN HFA) 108 (90 Base) MCG/ACT inhaler ?meloxicam (MOBIC) 15 MG tablet ? ?[facility] [place] [place] [address] ?[address] [place] [address] ?Phone: [phone] Fax: [phone] ?

## 2021-05-07 NOTE — Telephone Encounter (Signed)
Copied from [place] [phone]. Topic: Quick Communication - Rx Refill/Question ?>> May 07, 2021  8:58 AM Tessa Lerner A wrote: ?Medication: oxyCODONE-acetaminophen (PERCOCET) 10-325 MG tablet [[id number]]  ? ?Has the patient contacted their pharmacy? No. ?(Agent: If no, request that the patient contact the pharmacy for the refill. If patient does not wish to contact the pharmacy document the reason why and proceed with request.) ?(Agent: If yes, when and what did the pharmacy advise?) ? ?Preferred Pharmacy (with phone number or street name): [facility] [place] [facility] [place] [address] [address] ?Phone: [phone] Fax: [phone] ?Hours: Not open 24 hours ? ? ?Has the patient been seen for an appointment in the last year OR does the patient have an upcoming appointment? Yes.   ? ?Agent: Please be advised that RX refills may take up to 3 business days. We ask that you follow-up with your pharmacy. ?

## 2021-05-07 NOTE — Telephone Encounter (Signed)
Can you please check with patient regarding her visit today. Schedule note says she cannot see out of her right eye. Is this something new or acute? If so then she needs to go to ER as she may have had another stroke ?

## 2021-05-07 NOTE — Telephone Encounter (Signed)
I called and spoke with patient. She reports having loss of vision in her right eye and weakness on her right side. She reports that symptoms started 3 weeks ago. She states "I've had 4 strokes in the past, and I believe I've had another one. This feels like the other ones I had". I advised patient that she needs to go to the ER for immediate evaluation. Patient refused to call 911 or go by ambulance. She says that it will cost too much. Patient states that she has a friend that is on their way, who can take her to the ER. Patient agrees to go to the ER for evaluation of symptoms.  ?

## 2021-05-07 NOTE — Telephone Encounter (Signed)
Copied from [place] [phone]. Topic: General - Other ?>> May 07, 2021 11:41 AM Yvette Rack wrote: ?Reason for CRM: Shalini with [facility] reports pt has medications listed but she does not have any of the following medications: lisinopril (ZESTRIL) 10 MG tablet, ezetimibe (ZETIA) 10 MG tablet, ondansetron (ZOFRAN ODT) 4 MG disintegrating tablet, and clonazePAM (KLONOPIN) 0.5 MG tablet ?

## 2021-05-07 NOTE — Telephone Encounter (Signed)
Requested medications are due for refill today. Unsure seems too soon. ? ?Requested medications are on the active medications list.  yes ? ?Last refill. 04/24/2021 #180 0 refills ? ?Future visit scheduled.   no ? ?Notes to clinic.  Medication refill is not delegated. ? ? ? ?Requested Prescriptions  ?Pending Prescriptions Disp Refills  ? oxyCODONE-acetaminophen (PERCOCET) 10-325 MG tablet 180 tablet 0  ?  Sig: One tablet every 4-6 hours as needed.  ?  ? Not Delegated - Analgesics:  Opioid Agonist Combinations Failed - 05/07/2021 11:58 AM  ?  ?  Failed - This refill cannot be delegated  ?  ?  Failed - Urine Drug Screen completed in last 360 days  ?  ?  Passed - Valid encounter within last 3 months  ?  Recent Outpatient Visits   ? ?      ? 2 weeks ago Burning with urination  ? [facility] Birdie Sons, MD  ? 3 months ago Type 2 diabetes mellitus with microalbuminuria, without long-term current use of insulin ([place])  ? [facility] Birdie Sons, MD  ? 7 months ago Type 2 diabetes mellitus with microalbuminuria, without long-term current use of insulin ([place])  ? [facility] Caryn Section, Kirstie Peri, MD  ? 10 months ago Ataxia  ? [facility] Birdie Sons, MD  ? 12 months ago History of CVA in adulthood  ? [facility] Caryn Section, Kirstie Peri, MD  ? ?  ?  ? ?  ?  ?  ?  ?

## 2021-05-07 NOTE — Telephone Encounter (Signed)
That's fine

## 2021-05-08 ENCOUNTER — Ambulatory Visit: Payer: Self-pay

## 2021-05-08 ENCOUNTER — Ambulatory Visit: Payer: Medicare HMO | Admitting: Family Medicine

## 2021-05-08 MED ORDER — METFORMIN HCL ER 500 MG PO TB24: 500.0000 mg | ORAL_TABLET | Freq: Two times a day (BID) | ORAL | 0 refills | Status: DC

## 2021-05-08 MED ORDER — ALBUTEROL SULFATE HFA 108 (90 BASE) MCG/ACT IN AERS: 2.0000 | INHALATION_SPRAY | Freq: Four times a day (QID) | RESPIRATORY_TRACT | 1 refills | Status: DC | PRN

## 2021-05-08 MED ORDER — MELOXICAM 15 MG PO TABS: ORAL_TABLET | ORAL | 0 refills | Status: DC

## 2021-05-08 NOTE — Telephone Encounter (Signed)
Patient called back today requesting an appointment for vision loss of right eye. Patient did not go to the ER yesterday as recommended by Dr. Caryn Section. She refuses to go to the ER today due to cost and possibly long wait times. Patient states "Im not going to the hospital.  It cost too much! They're just going to sit me in the waiting room for 18 hours and tell me there is nothing they can do for me". I spoke with Dr. Rosanna Randy about patient's symptoms. Dr. Rosanna Randy advised me that patient needs to go to the ER for evaluation of possible stroke symptoms and imaging. I advised patient of Dr. Marlan Palau recommendations. Patient still refused going to the ER and demanded an appointment in our office. We didn't have any appointments in the office today. Our next available appointment was tomorrow at 10:40am with Tally Joe, FNP. Patient accepted appointment.  ?

## 2021-05-08 NOTE — Telephone Encounter (Signed)
LMTCB, if patient returns call okay for [facility] to advise patient of Tally Joe message below, patient should seek immediate medical attention at ED today. Please have [place] nurse speak and triage patient. KW ?

## 2021-05-08 NOTE — Telephone Encounter (Signed)
Sending to mail pharmacy ?Requested Prescriptions  ?Pending Prescriptions Disp Refills  ?? metFORMIN (GLUCOPHAGE-XR) 500 MG 24 hr tablet 180 tablet 4  ?  Sig: Take 1 tablet (500 mg total) by mouth 2 (two) times daily with a meal.  ?  ? Endocrinology:  Diabetes - Biguanides Failed - 05/07/2021  1:08 PM  ?  ?  Failed - Cr in normal range and within 360 days  ?  Creatinine  ?Date Value Ref Range Status  ?02/02/2012 0.68 0.60 - 1.30 mg/dL Final  ? ?Creatinine, Ser  ?Date Value Ref Range Status  ?06/19/2020 0.56 (L) 0.57 - 1.00 mg/dL Final  ?   ?  ?  Failed - B12 Level in normal range and within 720 days  ?  Vitamin B-12  ?Date Value Ref Range Status  ?03/20/2016 166 (L) 180 - 914 pg/mL Final  ?  Comment:  ?  (NOTE) ?This assay is not validated for testing neonatal or ?myeloproliferative syndrome specimens for Vitamin B12 levels. ?Performed at [facility], [place] [address] [place], [place] ?[address] ?  ?   ?  ?  Failed - CBC within normal limits and completed in the last 12 months  ?  WBC  ?Date Value Ref Range Status  ?06/19/2020 9.4 3.4 - 10.8 x10E3/uL Final  ?03/16/2019 6.1 4.0 - 10.5 K/uL Final  ? ?RBC  ?Date Value Ref Range Status  ?06/19/2020 3.66 (L) 3.77 - 5.28 x10E6/uL Final  ?03/16/2019 3.92 3.87 - 5.11 MIL/uL Final  ? ?Hemoglobin  ?Date Value Ref Range Status  ?06/19/2020 12.4 11.1 - 15.9 g/dL Final  ? ?Hematocrit  ?Date Value Ref Range Status  ?06/19/2020 36.8 34.0 - 46.6 % Final  ? ?MCHC  ?Date Value Ref Range Status  ?06/19/2020 33.7 31.5 - 35.7 g/dL Final  ?03/16/2019 32.4 30.0 - 36.0 g/dL Final  ? ?MCH  ?Date Value Ref Range Status  ?06/19/2020 33.9 (H) 26.6 - 33.0 pg Final  ?03/16/2019 30.6 26.0 - 34.0 pg Final  ? ?MCV  ?Date Value Ref Range Status  ?06/19/2020 101 (H) 79 - 97 fL Final  ?02/02/2012 96 80 - 100 fL Final  ? ?No results found for: PLTCOUNTKUC, LABPLAT, [place] ?RDW  ?Date Value Ref Range Status  ?06/19/2020 14.2 11.7 - 15.4 % Final  ?02/02/2012 14.2 11.5 - 14.5 % Final  ? ?  ?  ?   Passed - HBA1C is between 0 and 7.9 and within 180 days  ?  Hemoglobin A1C  ?Date Value Ref Range Status  ?01/26/2021 6.9 (A) 4.0 - 5.6 % Final  ? ?Hgb A1c MFr Bld  ?Date Value Ref Range Status  ?09/07/2019 6.4 (H) 4.8 - 5.6 % Final  ?  Comment:  ?           Prediabetes: 5.7 - 6.4 ?         Diabetes: >6.4 ?         Glycemic control for adults with diabetes: <7.0 ?  ?   ?  ?  Passed - eGFR in normal range and within 360 days  ?  EGFR (African American)  ?Date Value Ref Range Status  ?02/02/2012 >60  Final  ? ?GFR calc Af Wyvonnia Lora  ?Date Value Ref Range Status  ?09/07/2019 102 >59 mL/min/1.73 Final  ?  Comment:  ?  **[facility] currently reports eGFR in compliance with the current** ?  recommendations of the [employer]. [facility] will ?  update reporting as new guidelines are published  from the NKF-ASN ?  Task force. ?  ? ?EGFR (Non-African Amer.)  ?Date Value Ref Range Status  ?02/02/2012 >60  Final  ?  Comment:  ?  eGFR values <90mL/min/1.73 m2 may be an indication of chronic ?kidney disease (CKD). ?Calculated eGFR is useful in patients with stable renal function. ?The eGFR calculation will not be reliable in acutely ill patients ?when serum creatinine is changing rapidly. It is not useful in  ?patients on dialysis. The eGFR calculation may not be applicable ?to patients at the low and high extremes of body sizes, pregnant ?women, and vegetarians. ?  ? ?GFR calc non Af Amer  ?Date Value Ref Range Status  ?09/07/2019 89 >59 mL/min/1.73 Final  ? ?eGFR  ?Date Value Ref Range Status  ?06/19/2020 102 >59 mL/min/1.73 Final  ?   ?  ?  Passed - Valid encounter within last 6 months  ?  Recent Outpatient Visits   ?      ? 2 weeks ago Burning with urination  ? [facility] Birdie Sons, MD  ? 3 months ago Type 2 diabetes mellitus with microalbuminuria, without long-term current use of insulin ([place])  ? [facility] Birdie Sons, MD  ? 7 months ago Type 2 diabetes mellitus with  microalbuminuria, without long-term current use of insulin ([place])  ? [facility] Caryn Section, Kirstie Peri, MD  ? 10 months ago Ataxia  ? [facility] Birdie Sons, MD  ? 12 months ago History of CVA in adulthood  ? [facility] Caryn Section, Kirstie Peri, MD  ?  ?  ?Future Appointments   ?        ? Tomorrow Gwyneth Sprout, [place], [facility]  ?  ? ?  ?  ?  ?? meloxicam (MOBIC) 15 MG tablet 90 tablet 3  ?  Sig: TAKE 1 TABLET BY MOUTH ONCE DAILY AS NEEDED FOR PAIN  ?  ? Analgesics:  COX2 Inhibitors Failed - 05/07/2021  1:08 PM  ?  ?  Failed - Manual Review: Labs are only required if the patient has taken medication for more than 8 weeks.  ?  ?  Failed - Cr in normal range and within 360 days  ?  Creatinine  ?Date Value Ref Range Status  ?02/02/2012 0.68 0.60 - 1.30 mg/dL Final  ? ?Creatinine, Ser  ?Date Value Ref Range Status  ?06/19/2020 0.56 (L) 0.57 - 1.00 mg/dL Final  ?   ?  ?  Passed - HGB in normal range and within 360 days  ?  Hemoglobin  ?Date Value Ref Range Status  ?06/19/2020 12.4 11.1 - 15.9 g/dL Final  ?   ?  ?  Passed - HCT in normal range and within 360 days  ?  Hematocrit  ?Date Value Ref Range Status  ?06/19/2020 36.8 34.0 - 46.6 % Final  ?   ?  ?  Passed - AST in normal range and within 360 days  ?  AST  ?Date Value Ref Range Status  ?06/19/2020 27 0 - 40 IU/L Final  ?   ?  ?  Passed - ALT in normal range and within 360 days  ?  ALT  ?Date Value Ref Range Status  ?06/19/2020 11 0 - 32 IU/L Final  ?   ?  ?  Passed - eGFR is 30 or above and within 360 days  ?  EGFR (African American)  ?Date Value Ref Range Status  ?02/02/2012 >  60  Final  ? ?GFR calc Af Amer  ?Date Value Ref Range Status  ?09/07/2019 102 >59 mL/min/1.73 Final  ?  Comment:  ?  **[facility] currently reports eGFR in compliance with the current** ?  recommendations of the [employer]. [facility] will ?  update reporting as new guidelines are published from the NKF-ASN ?  Task force. ?   ? ?EGFR (Non-African Amer.)  ?Date Value Ref Range Status  ?02/02/2012 >60  Final  ?  Comment:  ?  eGFR values <76mL/min/1.73 m2 may be an indication of chronic ?kidney disease (CKD). ?Calculated eGFR is useful in patients with stable renal function. ?The eGFR calculation will not be reliable in acutely ill patients ?when serum creatinine is changing rapidly. It is not useful in  ?patients on dialysis. The eGFR calculation may not be applicable ?to patients at the low and high extremes of body sizes, pregnant ?women, and vegetarians. ?  ? ?GFR calc non Af Amer  ?Date Value Ref Range Status  ?09/07/2019 89 >59 mL/min/1.73 Final  ? ?eGFR  ?Date Value Ref Range Status  ?06/19/2020 102 >59 mL/min/1.73 Final  ?   ?  ?  Passed - Patient is not pregnant  ?  ?  Passed - Valid encounter within last 12 months  ?  Recent Outpatient Visits   ?      ? 2 weeks ago Burning with urination  ? [facility] Birdie Sons, MD  ? 3 months ago Type 2 diabetes mellitus with microalbuminuria, without long-term current use of insulin ([place])  ? [facility] Birdie Sons, MD  ? 7 months ago Type 2 diabetes mellitus with microalbuminuria, without long-term current use of insulin ([place])  ? [facility] Caryn Section, Kirstie Peri, MD  ? 10 months ago Ataxia  ? [facility] Birdie Sons, MD  ? 12 months ago History of CVA in adulthood  ? [facility] Caryn Section, Kirstie Peri, MD  ?  ?  ?Future Appointments   ?        ? Tomorrow Gwyneth Sprout, [place], [facility]  ?  ? ?  ?  ?  ?? albuterol (VENTOLIN HFA) 108 (90 Base) MCG/ACT inhaler 18 g 3  ?  Sig: Inhale 2 puffs into the lungs every 6 (six) hours as needed for wheezing or shortness of breath.  ?  ? Pulmonology:  Beta Agonists 2 Passed - 05/07/2021  1:08 PM  ?  ?  Passed - Last BP in normal range  ?  BP Readings from Last 1 Encounters:  ?04/23/21 135/81  ?   ?  ?  Passed - Last Heart Rate in normal range  ?  Pulse  Readings from Last 1 Encounters:  ?04/23/21 90  ?   ?  ?  Passed - Valid encounter within last 12 months  ?  Recent Outpatient Visits   ?      ? 2 weeks ago Burning with urination  ? [facility],

## 2021-05-08 NOTE — Telephone Encounter (Signed)
?  Chief Complaint: vision changes ?Symptoms: R eye blurred vision and pulling to R ?Frequency: 2 days ?Pertinent Negatives: Patient denies pain to R eye ?Disposition: [] ED /[] Urgent Care (no appt availability in office) / [] Appointment(In office/virtual)/ []  [place] [facility]/ [] Home Care/ [x] Refused Recommended Disposition /[] [place]/ []  Follow-up with PCP ?Additional Notes: pt advised of Dr. Caryn Section and Daneil Dan, NP recommendations and pt refuses to go to ED. She states she will try to go to eye dr but wasn't going to ED and sitting and waiting for hours. Pt advised to get eye dr appt and let her know she has appt tomorrow at 1040. Pt says she will be at appt tomorrow.  ? ?Reason for Disposition ? [1] Blurred vision or visual changes AND [2] present now AND [3] sudden onset or new (e.g., minutes, hours, days)  (Exception: seeing floaters / black specks OR previously diagnosed migraine headaches with same symptoms) ? ?Answer Assessment - Initial Assessment Questions ?1. DESCRIPTION: "What is the vision loss like? Describe it for me." (e.g., complete vision loss, blurred vision, double vision, floaters, etc.) ?    Feelings like R eye going to the right and vision changes ?2. LOCATION: "One or both eyes?" If one, ask: "Which eye?" ?    R eye, this side is where she had previous strokes ?3. SEVERITY: "Can you see anything?" If Yes, ask: "What can you see?" (e.g., fine print) ?    Yes just blurred ?4. ONSET: "When did this begin?" "Did it start suddenly or has this been gradual?" ?    The other day ?5. PATTERN: "Does this come and go, or has it been constant since it started?" ?    Constant for most part ?6. PAIN: "Is there any pain in your eye(s)?"  (Scale 1-10; or mild, moderate, severe) ?    0 ?7. CONTACTS-GLASSES: "Do you wear contacts or glasses?" ?    glasses ?9. OTHER SYMPTOMS: "Do you have any other symptoms?" (e.g., confusion, headache, arm or leg weakness, speech problems) ?     no ? ?Protocols used: Vision Loss or [id number] ? ?

## 2021-05-08 NOTE — Telephone Encounter (Signed)
Patient advised per nurse triage: ? ?[2:55 PM] [name], [name] ?Yvette Riley in the pt calls basket, I have done advised her of Daneil Dan advise but she still refused ED. she was going to try to see eye dr and I advised her of appt tomorrow.  ? ? ? ?

## 2021-05-08 NOTE — Telephone Encounter (Addendum)
I returned Shalini's call. She says that she went out to patient's home for a visit. Shalini checked patients medications and found that patient didn't have a few medications that were listed on her medication list. These medications are listed below:  ? ?Lisinopril,  ?Clonazepam,  ?Zetia, and  ?ondansetron. ? ? Shalini just wanted to let Dr. Caryn Section know about this. Patient told Shalini that some of these are prn, and the others she has to pick up at the pharmacy.  ?

## 2021-05-09 ENCOUNTER — Ambulatory Visit (INDEPENDENT_AMBULATORY_CARE_PROVIDER_SITE_OTHER): Payer: Medicare Other | Admitting: Physician Assistant

## 2021-05-09 ENCOUNTER — Ambulatory Visit
Admission: RE | Admit: 2021-05-09 | Discharge: 2021-05-09 | Disposition: A | Discharge status: Home / Self Care | Payer: Medicare Other | Source: Ambulatory Visit | Attending: Physician Assistant | Admitting: Physician Assistant

## 2021-05-09 ENCOUNTER — Other Ambulatory Visit: Payer: Self-pay

## 2021-05-09 ENCOUNTER — Encounter: Payer: Self-pay | Admitting: Physician Assistant

## 2021-05-09 VITALS — BP 111/73 | HR 105 | Temp 98.2°F | Resp 14 | Wt 173.1 lb

## 2021-05-09 DIAGNOSIS — R0602 Shortness of breath: Secondary | ICD-10-CM

## 2021-05-09 DIAGNOSIS — H53131 Sudden visual loss, right eye: Secondary | ICD-10-CM | POA: Insufficient documentation

## 2021-05-09 DIAGNOSIS — R059 Cough, unspecified: Secondary | ICD-10-CM | POA: Diagnosis not present

## 2021-05-09 DIAGNOSIS — H5461 Unqualified visual loss, right eye, normal vision left eye: Secondary | ICD-10-CM | POA: Diagnosis not present

## 2021-05-09 DIAGNOSIS — R0600 Dyspnea, unspecified: Secondary | ICD-10-CM | POA: Diagnosis not present

## 2021-05-09 DIAGNOSIS — R768 Other specified abnormal immunological findings in serum: Secondary | ICD-10-CM | POA: Diagnosis not present

## 2021-05-09 DIAGNOSIS — I63531 Cerebral infarction due to unspecified occlusion or stenosis of right posterior cerebral artery: Secondary | ICD-10-CM | POA: Diagnosis not present

## 2021-05-09 NOTE — Progress Notes (Signed)
?  ? ?I,Willett Lefeber Robinson,acting as a Education administrator for [employer], PA-C.,have documented all relevant documentation on the behalf of Mardene Speak, PA-C,as directed by  [employer], PA-C while in the presence of [employer], PA-C. ? ? ?Established patient visit ? ? ?Patient: Yvette Riley   DOB: 09-10-1955   66 y.o. Female  MRN: [id number] ?Visit Date: 05/09/2021 ? ?Today's healthcare provider: Mardene Speak, PA-C  ? ?Chief Complaint  ?Patient presents with  ? Eye Problem  ? ?Subjective  ?  ?Patient presents for right eye blurriness and "goes black sometimes" and feels like it's pulling to right/sometimes painful. Denies confusion, HA, arm or leg weakness, speech problems.  ?C/O right side weakness all the way down that side of body x 2 months. ?Patient has been advised by providers of our clinic to go to ER for the past two days and pt refuses to go.  She states she will try to go to eye dr but wasn't going to ED and sitting and waiting for hours ? ?Had a stroke 3 years ago. Received chemotherapy and radiotherapy in 2018 for lung cancer. ?Was seen at ED on 03/13/2021 for n/v/d, no note available for review ?Was seen at ED on 01/10/21 for abnormal blood glucose and fall ?CT brain showed ?        No acute intracranial abnormality. ?        Remote infarction right cerebellum and posterior right occipital lobe. ?        Moderate brain atrophy and microvascular disease. ?XR Chest showed ?New right-sided opacity concerning for airspace disease and/or loculated effusion. Further evaluation with CT is recommended.  ? ?Was seen at ED on 09/29/20 for minor head injury and musculoskeletal pain ? ?Eye Problem  ?The right eye is affected. This is a new problem. The current episode started 1 to 4 weeks ago. The problem occurs intermittently. The problem has been unchanged. There was no injury mechanism. The pain is moderate. There is No known exposure to pink eye. She Does not wear contacts. Associated symptoms include blurred  vision, double vision, itching and photophobia. Pertinent negatives include no eye discharge, eye redness, fever, foreign body sensation, nausea, recent URI or vomiting. She has tried nothing for the symptoms.  ? ?Medications: ?Outpatient Medications Prior to Visit  ?Medication Sig  ? albuterol (VENTOLIN HFA) 108 (90 Base) MCG/ACT inhaler Inhale 2 puffs into the lungs every 6 (six) hours as needed for wheezing or shortness of breath.  ? Alirocumab (PRALUENT) 150 MG/ML SOAJ Inject 1 mL into the skin every 14 (fourteen) days.  ? ANORO ELLIPTA 62.5-25 MCG/INH AEPB Inhale 1 puff into the lungs daily.  ? aspirin 81 MG EC tablet Take 1 tablet (81 mg total) by mouth daily. Swallow whole.  ? Blood Glucose Monitoring Suppl (ONE TOUCH ULTRA 2) w/Device KIT Use to check sugar daily for type 2 diabetes E11.9  ? clonazePAM (KLONOPIN) 0.5 MG tablet Take 1 tablet (0.5 mg total) by mouth 2 (two) times daily as needed.  ? clopidogrel (PLAVIX) 75 MG tablet Take 1 tablet (75 mg total) by mouth daily.  ? ezetimibe (ZETIA) 10 MG tablet Take 1 tablet by mouth once daily  ? fenofibrate 160 MG tablet Take 1 tablet (160 mg total) by mouth daily.  ? gabapentin (NEURONTIN) 600 MG tablet Take one tablet up to 4 times a day  ? glucose blood (ONETOUCH ULTRA) test strip Use to check blood sugar daily for type 2 diabetes E11.9  ? Iron,  Ferrous Sulfate, 325 (65 Fe) MG TABS Take 325 mg by mouth daily.  ? Lancets (ONETOUCH ULTRASOFT) lancets Use to check sugar daily for type 2 diabetes E11.9  ? lisinopril (ZESTRIL) 10 MG tablet Take 10 mg by mouth daily.  ? meloxicam (MOBIC) 15 MG tablet TAKE 1 TABLET BY MOUTH ONCE DAILY AS NEEDED FOR PAIN  ? metFORMIN (GLUCOPHAGE-XR) 500 MG 24 hr tablet Take 1 tablet (500 mg total) by mouth 2 (two) times daily with a meal.  ? metoprolol succinate (TOPROL-XL) 25 MG 24 hr tablet Take 1 tablet (25 mg total) by mouth daily.  ? montelukast (SINGULAIR) 10 MG tablet Take 1 tablet (10 mg total) by mouth at bedtime.  ?  NARCAN 4 MG/0.1ML LIQD nasal spray kit Place 0.4 mg into the nose once.   ? oxyCODONE-acetaminophen (PERCOCET) 10-325 MG tablet One tablet every 4-6 hours as needed.  ? sertraline (ZOLOFT) 50 MG tablet Take 1 tablet (50 mg total) by mouth daily.  ? sucralfate (CARAFATE) 1 g tablet TAKE 1 TABLET BY MOUTH 4 TIMES DAILY (WITH MEALS AND AT BEDTIME)  ? Tiotropium Bromide-Olodaterol 2.5-2.5 MCG/ACT AERS Inhale 2 puffs into the lungs daily.  ? zaleplon (SONATA) 10 MG capsule TAKE 1 CAPSULE BY MOUTH AT BEDTIME. MAY TAKE SECOND CAPSULE AFTER 2 HOURS IF NEEDED  ? benzonatate (TESSALON) 100 MG capsule Take 1 capsule by mouth three times daily as needed for cough (Patient not taking: Reported on 01/26/2021)  ? ondansetron (ZOFRAN ODT) 4 MG disintegrating tablet Take 1 tablet (4 mg total) by mouth every 8 (eight) hours as needed for nausea or vomiting. (Patient not taking: Reported on 05/09/2021)  ? ?No facility-administered medications prior to visit.  ? ? ?Review of Systems  ?Constitutional:  Negative for fever.  ?Eyes:  Positive for photophobia and itching. Negative for discharge and redness.  ?Gastrointestinal:  Negative for nausea and vomiting.  ?Endocrine: Positive for polyphagia.  ? ? ?  Objective  ?  ?BP 111/73 (BP Location: Right Arm, Patient Position: Sitting, Cuff Size: Normal)   Pulse (!) 105   Temp 98.2 ?F (36.8 ?C) (Oral)   Resp 14   Wt 173 lb 1.6 oz (78.5 kg)   SpO2 95%   BMI 36.18 kg/m?  ? ? ?Physical Exam ?Vitals and nursing note reviewed.  ?Constitutional:   ?   General: She is in acute distress.  ?   Appearance: Normal appearance. She is obese.  ?HENT:  ?   Head: Normocephalic and atraumatic.  ?Eyes:  ?   Comments: Nystagmus present on EOM test  ?Cardiovascular:  ?   Rate and Rhythm: Tachycardia present.  ?   Pulses: Normal pulses.  ?Pulmonary:  ?   Breath sounds: Rhonchi and rales present.  ?Neurological:  ?   General: No focal deficit present.  ?   Mental Status: She is alert and oriented to person,  place, and time.  ?   Sensory: No sensory deficit.  ?   Motor: Weakness (right side, residual deficit  p CVA in 2020) present.  ?   Coordination: Coordination abnormal.  ?  ?Ambulates with a walker ? ?No results found for any visits on 05/09/21. ? Assessment & Plan  ?  ? ?SOB/cough ?With abnormal lung exam but normal vitals ?Hx of SCLC ?Hx of COPD ?Former Smoker- Quit in 2020 ?-CBC ?-CMP ?-TSH ?-D-dimer ?-EKG results from today: incomplete RBBB, precordial leads ?-Troponin T ?She has an appt with Cardiologist this month ?Needs CXT ? ?Vision impairment of  the right eye ?OD 20/100 ?OS 20/70 ?Has a high fall risk, ambulates with walker ?Per patient, she had vision loss 3 times per day for the past 2 mo ?Neuro exam at baseline, Nystagmus on EOM test ?Hx of CVA in 2020, minor injury in 2022, fall in 2022 ?Needs to see ophthalmologist ?Needs CT brain ?   ?Patient refused to go to ER AMA ?The patient was advised to call back or seek an in-person evaluation if the symptoms worsen or if the condition fails to improve as anticipated. ? ?I discussed the assessment and treatment plan with the patient. The patient was provided an opportunity to ask questions and all were answered. The patient agreed with the plan and demonstrated an understanding of the instructions. ? ?The entirety of the information documented in the History of Present Illness, Review of Systems and Physical Exam were personally obtained by me. Portions of this information were initially documented by the CMA and reviewed by me for thoroughness and accuracy.   ? ? Total encounter time more than 40 minutes ? Greater than 50% was spent in counseling and coordination of care with the patient  ? She has transportation problems ? ?Mardene Speak, PA-C  ?[place] ?[phone] (phone) ?[phone] (fax) ? ?[facility] ?

## 2021-05-10 LAB — CBC WITH DIFFERENTIAL/PLATELET
Basophils Absolute: 0 10*3/uL (ref 0.0–0.2)
Basos: 0 %
EOS (ABSOLUTE): 0.4 10*3/uL (ref 0.0–0.4)
Eos: 5 %
Hematocrit: 35.1 % (ref 34.0–46.6)
Hemoglobin: 12.1 g/dL (ref 11.1–15.9)
Immature Grans (Abs): 0 10*3/uL (ref 0.0–0.1)
Immature Granulocytes: 0 %
Lymphocytes Absolute: 1.1 10*3/uL (ref 0.7–3.1)
Lymphs: 13 %
MCH: 36.6 pg — ABNORMAL HIGH (ref 26.6–33.0)
MCHC: 34.5 g/dL (ref 31.5–35.7)
MCV: 106 fL — ABNORMAL HIGH (ref 79–97)
Monocytes Absolute: 0.3 10*3/uL (ref 0.1–0.9)
Monocytes: 4 %
Neutrophils Absolute: 6.4 10*3/uL (ref 1.4–7.0)
Neutrophils: 78 %
Platelets: 325 10*3/uL (ref 150–450)
RBC: 3.31 x10E6/uL — ABNORMAL LOW (ref 3.77–5.28)
RDW: 16.9 % — ABNORMAL HIGH (ref 11.7–15.4)
WBC: 8.2 10*3/uL (ref 3.4–10.8)

## 2021-05-10 LAB — COMPREHENSIVE METABOLIC PANEL
ALT: 16 IU/L (ref 0–32)
AST: 33 IU/L (ref 0–40)
Albumin/Globulin Ratio: 1.6 (ref 1.2–2.2)
Albumin: 4.6 g/dL (ref 3.8–4.8)
Alkaline Phosphatase: 96 IU/L (ref 44–121)
BUN/Creatinine Ratio: 24 (ref 12–28)
BUN: 14 mg/dL (ref 8–27)
Bilirubin Total: 0.4 mg/dL (ref 0.0–1.2)
CO2: 23 mmol/L (ref 20–29)
Calcium: 9.3 mg/dL (ref 8.7–10.3)
Chloride: 105 mmol/L (ref 96–106)
Creatinine, Ser: 0.59 mg/dL (ref 0.57–1.00)
Globulin, Total: 2.8 g/dL (ref 1.5–4.5)
Glucose: 134 mg/dL — ABNORMAL HIGH (ref 70–99)
Potassium: 5 mmol/L (ref 3.5–5.2)
Sodium: 146 mmol/L — ABNORMAL HIGH (ref 134–144)
Total Protein: 7.4 g/dL (ref 6.0–8.5)
eGFR: 101 mL/min/{1.73_m2} (ref >59)

## 2021-05-10 LAB — D-DIMER, QUANTITATIVE: D-DIMER: 0.74 mg/L FEU — ABNORMAL HIGH (ref 0.00–0.49)

## 2021-05-10 LAB — TSH: TSH: 1.59 u[IU]/mL (ref 0.450–4.500)

## 2021-05-10 LAB — TROPONIN T: Troponin T (Highly Sensitive): 10 ng/L (ref 0–14)

## 2021-05-11 ENCOUNTER — Telehealth: Payer: Self-pay | Admitting: Family Medicine

## 2021-05-11 ENCOUNTER — Other Ambulatory Visit: Payer: Self-pay | Admitting: Family Medicine

## 2021-05-11 ENCOUNTER — Telehealth: Payer: Self-pay

## 2021-05-11 DIAGNOSIS — I10 Essential (primary) hypertension: Secondary | ICD-10-CM | POA: Diagnosis not present

## 2021-05-11 DIAGNOSIS — Z792 Long term (current) use of antibiotics: Secondary | ICD-10-CM | POA: Diagnosis not present

## 2021-05-11 DIAGNOSIS — E785 Hyperlipidemia, unspecified: Secondary | ICD-10-CM | POA: Diagnosis not present

## 2021-05-11 DIAGNOSIS — Z9181 History of falling: Secondary | ICD-10-CM | POA: Diagnosis not present

## 2021-05-11 DIAGNOSIS — N390 Urinary tract infection, site not specified: Secondary | ICD-10-CM | POA: Diagnosis not present

## 2021-05-11 DIAGNOSIS — Z7902 Long term (current) use of antithrombotics/antiplatelets: Secondary | ICD-10-CM | POA: Diagnosis not present

## 2021-05-11 DIAGNOSIS — G8929 Other chronic pain: Secondary | ICD-10-CM

## 2021-05-11 DIAGNOSIS — Z7984 Long term (current) use of oral hypoglycemic drugs: Secondary | ICD-10-CM | POA: Diagnosis not present

## 2021-05-11 DIAGNOSIS — I69393 Ataxia following cerebral infarction: Secondary | ICD-10-CM | POA: Diagnosis not present

## 2021-05-11 DIAGNOSIS — E119 Type 2 diabetes mellitus without complications: Secondary | ICD-10-CM | POA: Diagnosis not present

## 2021-05-11 DIAGNOSIS — Z7982 Long term (current) use of aspirin: Secondary | ICD-10-CM | POA: Diagnosis not present

## 2021-05-11 DIAGNOSIS — I69351 Hemiplegia and hemiparesis following cerebral infarction affecting right dominant side: Secondary | ICD-10-CM | POA: Diagnosis not present

## 2021-05-11 DIAGNOSIS — J4530 Mild persistent asthma, uncomplicated: Secondary | ICD-10-CM | POA: Diagnosis not present

## 2021-05-11 NOTE — Telephone Encounter (Signed)
Copied from [place] [id number]. Topic: General - Inquiry ?>> May 11, 2021  4:00 PM Scherrie Gerlach wrote: ?Reason for CRM: pt wants to know if you can tell her will select RX deliver opioids?  I advised her to all them, but she declined. ?i ?

## 2021-05-11 NOTE — Telephone Encounter (Signed)
Pt is calling to ask if transportation can be arranged for her appt on 05/30/21 Please advise CB- [phone] ?

## 2021-05-11 NOTE — Telephone Encounter (Signed)
Pt is calling for her lab results. Please advise CB- [phone] ?

## 2021-05-11 NOTE — Telephone Encounter (Signed)
Pt following up on request for her pain medicine ?oxyCODONE-acetaminophen (PERCOCET) 10-325 MG tablet ? ?Pt states she will be out this weekend and needs asap ? ?[place] (N),  - [place] ?

## 2021-05-11 NOTE — Telephone Encounter (Signed)
Pt following up on request for her pain medicine ?oxyCODONE-acetaminophen (PERCOCET) 10-325 MG tablet ?  ?Pt states she will be out this weekend and needs asap ?  ?[place] (N), [place] - [place] ?

## 2021-05-11 NOTE — Telephone Encounter (Signed)
Medication Refill - Medication: oxyCODONE-acetaminophen (PERCOCET) 10-325 MG tablet [[id number]]  ? ?Has the patient contacted their pharmacy? No. ?(Agent: If no, request that the patient contact the pharmacy for the refill. If patient does not wish to contact the pharmacy document the reason why and proceed with request.) ?(Agent: If yes, when and what did the pharmacy advise?) ? ?Preferred Pharmacy (with phone number or street name):  ?[facility] - [place], [address] [place] [address]  ?Phone: [phone] Fax: [phone]  ?Hours: Not open 24 hours  ? ?Has the patient been seen for an appointment in the last year OR does the patient have an upcoming appointment? Yes.  09/25/20 ? ?Agent: Please be advised that RX refills may take up to 3 business days. We ask that you follow-up with your pharmacy. ? ?

## 2021-05-11 NOTE — Telephone Encounter (Signed)
Attempted to reach pt. Yvette Riley  ?Okay for [facility] nurse to give results.  ?

## 2021-05-11 NOTE — Telephone Encounter (Signed)
Per Letitia Libra verbal/call [facility] and add B-12.  Waiting for conformation.   ?

## 2021-05-11 NOTE — Telephone Encounter (Signed)
Please ask patient to reach out to her insurance to see what transportation options she has available through them.  ? ? ?Thanks, ?Junius Argyle, PharmD, BCACP, CPP  ?Clinical Pharmacist Practitioner  ?[place] ?[phone] ? ?

## 2021-05-14 ENCOUNTER — Telehealth: Payer: Self-pay | Admitting: Family Medicine

## 2021-05-14 DIAGNOSIS — N390 Urinary tract infection, site not specified: Secondary | ICD-10-CM | POA: Diagnosis not present

## 2021-05-14 DIAGNOSIS — Z7984 Long term (current) use of oral hypoglycemic drugs: Secondary | ICD-10-CM | POA: Diagnosis not present

## 2021-05-14 DIAGNOSIS — I69351 Hemiplegia and hemiparesis following cerebral infarction affecting right dominant side: Secondary | ICD-10-CM | POA: Diagnosis not present

## 2021-05-14 DIAGNOSIS — Z792 Long term (current) use of antibiotics: Secondary | ICD-10-CM | POA: Diagnosis not present

## 2021-05-14 DIAGNOSIS — J4530 Mild persistent asthma, uncomplicated: Secondary | ICD-10-CM | POA: Diagnosis not present

## 2021-05-14 DIAGNOSIS — I69393 Ataxia following cerebral infarction: Secondary | ICD-10-CM | POA: Diagnosis not present

## 2021-05-14 DIAGNOSIS — Z7982 Long term (current) use of aspirin: Secondary | ICD-10-CM | POA: Diagnosis not present

## 2021-05-14 DIAGNOSIS — E119 Type 2 diabetes mellitus without complications: Secondary | ICD-10-CM | POA: Diagnosis not present

## 2021-05-14 DIAGNOSIS — E785 Hyperlipidemia, unspecified: Secondary | ICD-10-CM | POA: Diagnosis not present

## 2021-05-14 DIAGNOSIS — F419 Anxiety disorder, unspecified: Secondary | ICD-10-CM

## 2021-05-14 DIAGNOSIS — I10 Essential (primary) hypertension: Secondary | ICD-10-CM | POA: Diagnosis not present

## 2021-05-14 MED ORDER — OXYCODONE-ACETAMINOPHEN 10-325 MG PO TABS: ORAL_TABLET | ORAL | 0 refills | Status: DC

## 2021-05-14 NOTE — Telephone Encounter (Signed)
Patient advised to call pharmacy to ask about this.  ?

## 2021-05-14 NOTE — Telephone Encounter (Signed)
[facility] calling to ask if you can send Proventil in place of the  ?albuterol (VENTOLIN HFA) 108 (90 Base) MCG/ACT inhaler ? ?This is the preferred drug now. ? ?[facility] [place] [address] ? ?

## 2021-05-14 NOTE — Telephone Encounter (Signed)
Requested medication (s) are due for refill today: Yes ? ?Requested medication (s) are on the active medication list: Yes ? ?Last refill:  04/04/21 ? ?Future visit scheduled: Yes ? ?Notes to clinic:  See request. ? ? ? ?Requested Prescriptions  ?Pending Prescriptions Disp Refills  ? oxyCODONE-acetaminophen (PERCOCET) 10-325 MG tablet 180 tablet 0  ?  Sig: One tablet every 4-6 hours as needed.  ?  ? Not Delegated - Analgesics:  Opioid Agonist Combinations Failed - 05/11/2021  4:05 PM  ?  ?  Failed - This refill cannot be delegated  ?  ?  Failed - Urine Drug Screen completed in last 360 days  ?  ?  Passed - Valid encounter within last 3 months  ?  Recent Outpatient Visits   ? ?      ? 5 days ago Shortness of breath  ? [facility] Brooks, [place], PA-C  ? 3 weeks ago Burning with urination  ? [facility] Birdie Sons, MD  ? 3 months ago Type 2 diabetes mellitus with microalbuminuria, without long-term current use of insulin ([place])  ? [facility] Birdie Sons, MD  ? 7 months ago Type 2 diabetes mellitus with microalbuminuria, without long-term current use of insulin ([place])  ? [facility] Caryn Section, Kirstie Peri, MD  ? 10 months ago Ataxia  ? [facility] Caryn Section, Kirstie Peri, MD  ? ?  ?  ?Future Appointments   ? ?        ? In 2 weeks Fisher, Kirstie Peri, MD [facility], [facility]  ? ?  ? ? ?  ?  ?  ? ?

## 2021-05-14 NOTE — Telephone Encounter (Signed)
Patient notified of lab results and imaging results.  ?

## 2021-05-15 ENCOUNTER — Other Ambulatory Visit: Payer: Self-pay | Admitting: Physician Assistant

## 2021-05-15 DIAGNOSIS — R7989 Other specified abnormal findings of blood chemistry: Secondary | ICD-10-CM

## 2021-05-15 NOTE — Progress Notes (Signed)
[place] ,  ? ?Your labwork results are back. Your B12 level is very low, you need to start weekly B12 injection at our clinic.  ?Your D dimer is high . You need to do chest CT . I will place a referral for Chest CT. ?Scheduler will call you to schedule an appointment. ? ?Any questions please reach out to the office or message me on MyChart! ? ?Best, ? ?Mardene Speak, PA-C

## 2021-05-16 ENCOUNTER — Ambulatory Visit: Payer: Self-pay | Admitting: *Deleted

## 2021-05-16 ENCOUNTER — Encounter: Payer: Self-pay | Admitting: Emergency Medicine

## 2021-05-16 ENCOUNTER — Emergency Department: Payer: Medicare Other

## 2021-05-16 ENCOUNTER — Ambulatory Visit: Admission: RE | Admit: 2021-05-16 | Payer: Medicare Other | Source: Ambulatory Visit

## 2021-05-16 ENCOUNTER — Other Ambulatory Visit: Payer: Self-pay

## 2021-05-16 ENCOUNTER — Emergency Department
Admission: EM | Admit: 2021-05-16 | Discharge: 2021-05-16 | Disposition: A | Discharge status: Home / Self Care | Payer: Medicare Other | Attending: Emergency Medicine | Admitting: Emergency Medicine

## 2021-05-16 DIAGNOSIS — Z85118 Personal history of other malignant neoplasm of bronchus and lung: Secondary | ICD-10-CM | POA: Insufficient documentation

## 2021-05-16 DIAGNOSIS — R1030 Lower abdominal pain, unspecified: Secondary | ICD-10-CM | POA: Insufficient documentation

## 2021-05-16 DIAGNOSIS — Z20822 Contact with and (suspected) exposure to covid-19: Secondary | ICD-10-CM | POA: Insufficient documentation

## 2021-05-16 DIAGNOSIS — R0602 Shortness of breath: Secondary | ICD-10-CM | POA: Insufficient documentation

## 2021-05-16 DIAGNOSIS — R42 Dizziness and giddiness: Secondary | ICD-10-CM | POA: Diagnosis not present

## 2021-05-16 DIAGNOSIS — R112 Nausea with vomiting, unspecified: Secondary | ICD-10-CM | POA: Insufficient documentation

## 2021-05-16 DIAGNOSIS — K222 Esophageal obstruction: Secondary | ICD-10-CM

## 2021-05-16 DIAGNOSIS — R109 Unspecified abdominal pain: Secondary | ICD-10-CM | POA: Diagnosis not present

## 2021-05-16 DIAGNOSIS — I700 Atherosclerosis of aorta: Secondary | ICD-10-CM | POA: Diagnosis not present

## 2021-05-16 DIAGNOSIS — R079 Chest pain, unspecified: Secondary | ICD-10-CM | POA: Insufficient documentation

## 2021-05-16 DIAGNOSIS — R000 Tachycardia, unspecified: Secondary | ICD-10-CM | POA: Diagnosis not present

## 2021-05-16 DIAGNOSIS — R718 Other abnormality of red blood cells: Secondary | ICD-10-CM | POA: Insufficient documentation

## 2021-05-16 LAB — CBC
HCT: 36.4 % (ref 36.0–46.0)
Hemoglobin: 12 g/dL (ref 12.0–15.0)
MCH: 36.5 pg — ABNORMAL HIGH (ref 26.0–34.0)
MCHC: 33 g/dL (ref 30.0–36.0)
MCV: 110.6 fL — ABNORMAL HIGH (ref 80.0–100.0)
Platelets: 317 10*3/uL (ref 150–400)
RBC: 3.29 MIL/uL — ABNORMAL LOW (ref 3.87–5.11)
RDW: 16 % — ABNORMAL HIGH (ref 11.5–15.5)
WBC: 7.2 10*3/uL (ref 4.0–10.5)
nRBC: 0 % (ref 0.0–0.2)

## 2021-05-16 LAB — BASIC METABOLIC PANEL
Anion gap: 11 (ref 5–15)
BUN: 12 mg/dL (ref 8–23)
CO2: 25 mmol/L (ref 22–32)
Calcium: 9.1 mg/dL (ref 8.9–10.3)
Chloride: 103 mmol/L (ref 98–111)
Creatinine, Ser: 0.63 mg/dL (ref 0.44–1.00)
GFR, Estimated: >60 mL/min (ref >60)
Glucose, Bld: 157 mg/dL — ABNORMAL HIGH (ref 70–99)
Potassium: 3.6 mmol/L (ref 3.5–5.1)
Sodium: 139 mmol/L (ref 135–145)

## 2021-05-16 LAB — HEPATIC FUNCTION PANEL
ALT: 13 U/L (ref 0–44)
AST: 26 U/L (ref 15–41)
Albumin: 4.3 g/dL (ref 3.5–5.0)
Alkaline Phosphatase: 70 U/L (ref 38–126)
Bilirubin, Direct: 0.2 mg/dL (ref 0.0–0.2)
Indirect Bilirubin: 1.1 mg/dL — ABNORMAL HIGH (ref 0.3–0.9)
Total Bilirubin: 1.3 mg/dL — ABNORMAL HIGH (ref 0.3–1.2)
Total Protein: 7.5 g/dL (ref 6.5–8.1)

## 2021-05-16 LAB — LIPASE, BLOOD: Lipase: 31 U/L (ref 11–51)

## 2021-05-16 LAB — RESP PANEL BY RT-PCR (FLU A&B, COVID) ARPGX2
Influenza A by PCR: NEGATIVE
Influenza B by PCR: NEGATIVE
SARS Coronavirus 2 by RT PCR: NEGATIVE

## 2021-05-16 LAB — TROPONIN I (HIGH SENSITIVITY)
Troponin I (High Sensitivity): 4 ng/L (ref <18)
Troponin I (High Sensitivity): 5 ng/L (ref <18)

## 2021-05-16 MED ORDER — FENTANYL CITRATE PF 50 MCG/ML IJ SOSY
50.0000 ug | PREFILLED_SYRINGE | Freq: Once | INTRAMUSCULAR | Status: AC
  Administered 2021-05-16: 50 ug via INTRAVENOUS
  Filled 2021-05-16: qty 1

## 2021-05-16 MED ORDER — PANTOPRAZOLE SODIUM 20 MG PO TBEC: 20.0000 mg | DELAYED_RELEASE_TABLET | Freq: Every day | ORAL | 1 refills | Status: DC

## 2021-05-16 MED ORDER — SODIUM CHLORIDE 0.9 % IV BOLUS
1000.0000 mL | Freq: Once | INTRAVENOUS | Status: AC
  Administered 2021-05-16: 1000 mL via INTRAVENOUS

## 2021-05-16 MED ORDER — GLUCAGON (RDNA) 1 MG IJ KIT
1.0000 mg | PACK | Freq: Once | INTRAMUSCULAR | Status: AC
  Administered 2021-05-16: 1 mg via INTRAVENOUS
  Filled 2021-05-16 (×2): qty 1

## 2021-05-16 MED ORDER — ONDANSETRON HCL 4 MG/2ML IJ SOLN
4.0000 mg | Freq: Once | INTRAMUSCULAR | Status: AC
  Administered 2021-05-16: 4 mg via INTRAVENOUS
  Filled 2021-05-16: qty 2

## 2021-05-16 MED ORDER — IOHEXOL 350 MG/ML SOLN
80.0000 mL | Freq: Once | INTRAVENOUS | Status: AC | PRN
  Administered 2021-05-16: 80 mL via INTRAVENOUS
  Filled 2021-05-16: qty 80

## 2021-05-16 NOTE — ED Notes (Signed)
Pt transported to CT ?

## 2021-05-16 NOTE — ED Provider Notes (Signed)
? ?[facility] ?Provider Note ? ? ? Event Date/Time  ? First MD Initiated Contact with Patient 05/16/21 1248   ?  (approximate) ? ? ?History  ? ?Chest Pain and Nausea ? ? ?HPI ? ?TA FAIR is a 66 y.o. female with prior CVA, prior lung cancer status post treatment with prior CVA who comes in with concerns for chest pain, nausea vomiting, shortness of breath.  Patient reports that has not been feeling well for the past 3 days.  She states that anytime she tries to eat she will throw up.  She reports that she has not been able to keep down any of her pain medications.  She reports worsening pain in her abdomen and her and her chest.  Denies ever having this previously. ? ?I reviewed patient's records from her family doctor on 05/09/2021 where she came in for shortness of breath and had a positive D-dimer but did not get a follow-up CT scan.  She had a CT head that was negative for acute pathology ? ? ? ?Physical Exam  ? ?Triage Vital Signs: ?ED Triage Vitals  ?Enc Vitals Group  ?   BP 05/16/21 1052 (!) 139/111  ?   Pulse Rate 05/16/21 1052 (!) 118  ?   Resp 05/16/21 1052 20  ?   Temp 05/16/21 1052 98 ?F (36.7 ?C)  ?   Temp Source 05/16/21 1052 Oral  ?   SpO2 05/16/21 1052 92 %  ?   Weight 05/16/21 1053 173 lb 1.6 oz (78.5 kg)  ?   Height 05/16/21 1053 4\' 10"  (1.473 m)  ?   Head Circumference --   ?   Peak Flow --   ?   Pain Score 05/16/21 1053 9  ?   Pain Loc --   ?   Pain Edu? --   ?   Excl. in [place]? --   ? ? ?Most recent vital signs: ?Vitals:  ? 05/16/21 1052  ?BP: (!) 139/111  ?Pulse: (!) 118  ?Resp: 20  ?Temp: 98 ?F (36.7 ?C)  ?SpO2: 92%  ? ? ? ?General: Awake, no distress.  ?CV:  Good peripheral perfusion.  Tachycardic ?Resp:  Normal effort.  ?Abd:  No distention.  ?Other:  Reports some lower abdominal tenderness with palpation ? ? ?ED Results / Procedures / Treatments  ? ?Labs ?(all labs ordered are listed, but only abnormal results are displayed) ?Labs Reviewed  ?BASIC METABOLIC PANEL  - Abnormal; Notable for the following components:  ?    Result Value  ? Glucose, Bld 157 (*)   ? All other components within normal limits  ?CBC - Abnormal; Notable for the following components:  ? RBC 3.29 (*)   ? MCV 110.6 (*)   ? MCH 36.5 (*)   ? RDW 16.0 (*)   ? All other components within normal limits  ?TROPONIN I (HIGH SENSITIVITY)  ?TROPONIN I (HIGH SENSITIVITY)  ? ? ? ?EKG ? ?My interpretation of EKG: ? ?Sinus tachycardia with rate of 117 with out any ST elevation or T wave inversions, normal intervals ? ?RADIOLOGY ?I have reviewed the xray personally and no new pneumonia.  Similar posttreatment changes ? ? ? ?PROCEDURES: ? ?Critical Care performed: No ? ?Procedures ? ? ?MEDICATIONS ORDERED IN ED: ?Medications - No data to display ? ? ?IMPRESSION / MDM / ASSESSMENT AND PLAN / ED COURSE  ?I reviewed the triage vital signs and the nursing notes. ? ? ?Patient comes in tachycardic with  chest pain, shortness of breath, abdominal pain not keeping food down.  Reviewed the records that she had a CT head just a few days ago that was negative therefore do not feel like she has a new brain mass causing this.  However I am concerned about possible PE given she is tachycardic and had a positive D-dimer a few days ago has not gotten the CT yet so we will add on CT to further evaluate.  Also given the abdominal discomfort will get CT abdomen to evaluate for acute process. ? ?Troponin negative BMP reassuring.  CBC reassuring ? ?Patient handed off to oncoming team pending CT imaging and reassessment of oxygen levels, and PO challenge  ? ?The patient is on the cardiac monitor to evaluate for evidence of arrhythmia and/or significant heart rate changes. ? ?  ? ? ?FINAL CLINICAL IMPRESSION(S) / ED DIAGNOSES  ? ?Final diagnoses:  ?Nausea and vomiting, unspecified vomiting type  ? ? ? ?Rx / DC Orders  ? ?ED Discharge Orders   ? ? None  ? ?  ? ? ? ?Note:  This document was prepared using Dragon voice recognition software and may  include unintentional dictation errors. ?  ?Vanessa [place], MD ?05/16/21 1517 ? ?

## 2021-05-16 NOTE — Telephone Encounter (Signed)
?  Chief Complaint: unable to swallow solids/liquids ?Symptoms: unable to swallow solids/liquids, changes in breathing ?Frequency: 2 days ?Pertinent Negatives: Patient denies pain,sore throat ?Disposition: [x] ED /[] Urgent Care (no appt availability in office) / [] Appointment(In office/virtual)/ []  [place] Virtual [facility]/ [] Home Care/ [] Refused Recommended Disposition /[] [place] Mobile Bus/ []  Follow-up with PCP ?Additional Notes:    ?

## 2021-05-16 NOTE — ED Provider Notes (Signed)
----------------------------------------- ?  3:08 PM on 05/16/2021 ?----------------------------------------- ? ?Blood pressure (!) 139/111, pulse (!) 118, temperature 98 ?F (36.7 ?C), temperature source Oral, resp. rate 20, height 4\' 10"  (1.473 m), weight 78.5 kg, SpO2 92 %. ? ?Assuming care from Dr. Jari Pigg.  In short, Yvette Riley is a 66 y.o. female with a chief complaint of Chest Pain and Nausea ?[name]  Refer to the original H&P for additional details. ? ?The current plan of care is to follow-up CTA of chest and CT abdomen and pelvis, reassess nausea with PO challenge. ? ?----------------------------------------- ?5:51 PM on 05/16/2021 ?----------------------------------------- ? ?CTA of chest is negative for PE, does show accumulating material in the esophagus with mild associated dilation concerning for impacted food bolus.  CT of abdomen/pelvis shows signs of possible colitis but is otherwise unremarkable.  On reassessment, patient states that she has been unable to swallow either solids or liquids for the past 3 days, ever since she had ham for dinner.  She was given dose of IV glucagon, subsequently vomited up to chunks of what appeared to be meat.  She is now feeling better, feels like any blockage has cleared, and is able to tolerate water without difficulty.  She is appropriate for discharge home with close GI follow-up for endoscopy, will also be started on PPI in the meantime.  She was counseled to return to the ED for new worsening symptoms, patient agrees with plan. ?  ?Blake Divine, MD ?05/16/21 1753 ? ?

## 2021-05-16 NOTE — Telephone Encounter (Signed)
See triage notes- Call to patient- reports unable to swallow solids/liquids for 2 days ?

## 2021-05-16 NOTE — ED Notes (Signed)
Pt called nurse over to ask about her shirt, pt informed that it is in a bag in the back of her wheelchair. Pt inquired as to how long it would be before she taken back to a room. Pt informed that I cannot give her a timeframe but we are working to get pts back as soon as I can. Pt was reassured that her EKG had been looked at by MD.  ?

## 2021-05-16 NOTE — Telephone Encounter (Signed)
Pt would like a nurse to call her back to go over labs again ?

## 2021-05-16 NOTE — Telephone Encounter (Signed)
Reason for Disposition ? SEVERE difficulty swallowing (e.g., drooling or spitting, can't swallow water) ? ?Answer Assessment - Initial Assessment Questions ?1. SYMPTOM: "Are you having difficulty swallowing liquids, solids, or both?" ?    both ?2. ONSET: "When did the swallowing problems begin?"  ?    3 days ?3. CAUSE: "What do you think is causing the problem?"  ?    unsure ?4. CHRONIC/RECURRENT: "Is this a new problem for you?"  If no, ask: "How long have you had this problem?" (e.g., days, weeks, months)  ?    Yes- but worse ?5. OTHER SYMPTOMS: "Do you have any other symptoms?" (e.g., difficulty breathing, sore throat, swollen tongue, chest pain) ?    Changes in breathing, unable to swallow solids/liquids for 2 days- reflux/vomits ?6. PREGNANCY: "Is there any chance you are pregnant?" "When was your last menstrual period?" ? ?Protocols used: Swallowing Difficulty-A-AH ? ?

## 2021-05-16 NOTE — ED Notes (Signed)
Pt called nurse over requesting somewhere to lay down. Pt was informed we do not have anywhere for her to lay down but that she should be the next to go back to treatment room.  ?

## 2021-05-16 NOTE — ED Triage Notes (Signed)
Pt comes into the ED via POV c/o generalized chest pain that goes through her entire chest and radiates into her back.,  Pt also states that she is having nausea and she is unable to get food or water down.  Pt states she has a h/o MI's in the past, but denies any cardiac catheterizations.  Pt admits to [facility] and dizziness as well.  ?

## 2021-05-17 MED ORDER — ALBUTEROL SULFATE HFA 108 (90 BASE) MCG/ACT IN AERS: 2.0000 | INHALATION_SPRAY | Freq: Four times a day (QID) | RESPIRATORY_TRACT | 3 refills | Status: DC | PRN

## 2021-05-17 NOTE — Telephone Encounter (Signed)
Hydrocodone is not listed on active medication list. [facility] mail order pharmacy is requesting a refill. Please advise whether patient is supposed to be taking this medication. ?

## 2021-05-17 NOTE — Telephone Encounter (Addendum)
Caller faxed over a medication request on 05/13/2021 for HYDROCODONE 10/325 MG. Caller will re fax again today to fax  [phone].   ? ?[facility] - [place], [address] Phone:  [phone]  ?Fax:  [phone]  ?  ? ?

## 2021-05-18 ENCOUNTER — Ambulatory Visit: Payer: Self-pay

## 2021-05-18 LAB — VITAMIN B12: Vitamin B-12: <50 pg/mL — ABNORMAL LOW (ref 232–1245)

## 2021-05-18 LAB — SPECIMEN STATUS REPORT

## 2021-05-18 NOTE — Telephone Encounter (Signed)
Summary: medication question  ? Pt called in wanting to know some more information about her new medication, and how to take it, pantoprazole (PROTONIX) 20 MG tablet, please advise.   ?  ?Called pt - Voice mailbox is full. ?

## 2021-05-18 NOTE — Telephone Encounter (Signed)
? ?  Chief Complaint: Medication question ?Symptoms: What is pantoprazole used for? ?Frequency:  ?Pertinent Negatives: Patient denies any new s/s ?Disposition: [] ED /[] Urgent Care (no appt availability in office) / [] Appointment(In office/virtual)/ []  [place] [facility]/ [] Home Care/ [] Refused Recommended Disposition /[]  Mobile Bus/ [x]  Follow-up with PCP ?Additional Notes:  ? ?Summary: medication question  ? Pt called in wanting to know some more information about her new medication, and how to take it, pantoprazole (PROTONIX) 20 MG tablet, please advise.   ?  ? ?Reason for Disposition ? Caller has medicine question only, adult not sick, AND triager answers question ? ?Answer Assessment - Initial Assessment Questions ?1. NAME of MEDICATION: "What medicine are you calling about?" ?    Pantoprazole ?2. QUESTION: "What is your question?" (e.g., double dose of medicine, side effect) ?    What is this medication for? ?3. PRESCRIBING HCP: "Who prescribed it?" Reason: if prescribed by specialist, call should be referred to that group. ?     ?4. SYMPTOMS: "Do you have any symptoms?" ?     ?5. SEVERITY: If symptoms are present, ask "Are they mild, moderate or severe?" ?     ?6. PREGNANCY:  "Is there any chance that you are pregnant?" "When was your last menstrual period?" ?    na ? ?Protocols used: Medication Question Call-A-AH ? ?

## 2021-05-21 ENCOUNTER — Telehealth: Payer: Self-pay | Admitting: Physician Assistant

## 2021-05-21 NOTE — Telephone Encounter (Signed)
Please schedule this patient for B12 injections 1000 mcg parenterally once per week x4 weeks until the deficiency is corrected and then once per month (cyanocobalamin) . Check B12 level before this injection. Thank you ?

## 2021-05-22 ENCOUNTER — Other Ambulatory Visit: Payer: Self-pay | Admitting: Physician Assistant

## 2021-05-22 NOTE — Telephone Encounter (Signed)
Pt wanted a call back to go over the information below. Please advise.  ?

## 2021-05-22 NOTE — Telephone Encounter (Signed)
Attempted to reach pt for scheduling b-12 injections (Wed mornings / nurse visit) VM full and pt is not on MyChart.  Okay for  [facility] nurse to inform and schedule.   ?

## 2021-05-23 ENCOUNTER — Other Ambulatory Visit: Payer: Self-pay | Admitting: Physician Assistant

## 2021-05-23 DIAGNOSIS — E538 Deficiency of other specified B group vitamins: Secondary | ICD-10-CM

## 2021-05-23 MED ORDER — VITAMIN B-12 1000 MCG PO TABS: 1000.0000 ug | ORAL_TABLET | Freq: Every day | ORAL | 0 refills | Status: AC

## 2021-05-23 NOTE — Progress Notes (Signed)
Attempted to reach pt. VM full.  ?

## 2021-05-23 NOTE — Telephone Encounter (Signed)
Patient called and given the message below by [place], PA-C. Patient says she will not take the B12 shots and will take the B12 pills and for them to be sent to [place] below. Advised I will send this to [place].  ? ?Pharmacy: ?[facility] - [place], [address] Phone:  [phone]  ?Fax:  [phone]  ?  ? ?

## 2021-05-23 NOTE — Progress Notes (Unsigned)
Please, let patient know that Rx for B12 pills was sent to her designated pharmacy. ?Thank you ?

## 2021-05-24 ENCOUNTER — Telehealth: Payer: Self-pay

## 2021-05-24 ENCOUNTER — Ambulatory Visit: Payer: Self-pay | Admitting: *Deleted

## 2021-05-24 NOTE — Telephone Encounter (Signed)
Reason for Disposition ? [1] Follow-up call to recent contact AND [2] information only call, no triage required ? ?Answer Assessment - Initial Assessment Questions ?1. REASON FOR CALL or QUESTION: "What is your reason for calling today?" or "How can I best help you?" or "What question do you have that I can help answer?" ?    B12 pills rx ? ?I let her know the B12 pills rx was sent to her designated pharmacy.  [facility] on [address] ? ?Protocols used: Information Only Call - No Triage-A-AH ? ?

## 2021-05-24 NOTE — Telephone Encounter (Signed)
Copied from [place] [id number]. Topic: General - Other >> May 24, 2021 11:48 AM Erick Blinks wrote: Reason for CRM: Levada Dy from [facility] called to report that they are going to refax a request for the patients medication list  Best contact: [phone]

## 2021-05-24 NOTE — Telephone Encounter (Signed)
Called pt to let her know b-!2 Rx has been sent to pharmacy.  VM full.  If pt r/t's call okay for [facility] nurse to inform.   ?

## 2021-05-25 DIAGNOSIS — Z792 Long term (current) use of antibiotics: Secondary | ICD-10-CM | POA: Diagnosis not present

## 2021-05-25 DIAGNOSIS — Z7982 Long term (current) use of aspirin: Secondary | ICD-10-CM | POA: Diagnosis not present

## 2021-05-25 DIAGNOSIS — I69351 Hemiplegia and hemiparesis following cerebral infarction affecting right dominant side: Secondary | ICD-10-CM | POA: Diagnosis not present

## 2021-05-25 DIAGNOSIS — Z7984 Long term (current) use of oral hypoglycemic drugs: Secondary | ICD-10-CM | POA: Diagnosis not present

## 2021-05-25 DIAGNOSIS — I69393 Ataxia following cerebral infarction: Secondary | ICD-10-CM | POA: Diagnosis not present

## 2021-05-25 DIAGNOSIS — Z7902 Long term (current) use of antithrombotics/antiplatelets: Secondary | ICD-10-CM | POA: Diagnosis not present

## 2021-05-25 DIAGNOSIS — I10 Essential (primary) hypertension: Secondary | ICD-10-CM | POA: Diagnosis not present

## 2021-05-25 DIAGNOSIS — E119 Type 2 diabetes mellitus without complications: Secondary | ICD-10-CM | POA: Diagnosis not present

## 2021-05-25 DIAGNOSIS — E785 Hyperlipidemia, unspecified: Secondary | ICD-10-CM | POA: Diagnosis not present

## 2021-05-25 DIAGNOSIS — J4530 Mild persistent asthma, uncomplicated: Secondary | ICD-10-CM | POA: Diagnosis not present

## 2021-05-25 DIAGNOSIS — N390 Urinary tract infection, site not specified: Secondary | ICD-10-CM | POA: Diagnosis not present

## 2021-05-25 DIAGNOSIS — Z9181 History of falling: Secondary | ICD-10-CM | POA: Diagnosis not present

## 2021-05-25 NOTE — Telephone Encounter (Signed)
oxycodone/apap prescription form for [facility] was completely and sent to medical records to be faxed.  ?

## 2021-05-29 NOTE — Progress Notes (Deleted)
I,Jana Robinson,acting as a scribe for Lelon Huh, MD.,have documented all relevant documentation on the behalf of Lelon Huh, MD,as directed by  Lelon Huh, MD while in the presence of Lelon Huh, MD.'   Established patient visit   Patient: Yvette Riley   DOB: 01/04/1956   66 y.o. Female  MRN: [id number] Visit Date: 05/30/2021  Today's healthcare provider: Lelon Huh, MD   No chief complaint on file.  Subjective    HPI  ***  Medications: Outpatient Medications Prior to Visit  Medication Sig   albuterol (PROVENTIL HFA) 108 (90 Base) MCG/ACT inhaler Inhale 2 puffs into the lungs every 6 (six) hours as needed for wheezing or shortness of breath.   Alirocumab (PRALUENT) 150 MG/ML SOAJ Inject 1 mL into the skin every 14 (fourteen) days.   ANORO ELLIPTA 62.5-25 MCG/INH AEPB Inhale 1 puff into the lungs daily.   aspirin 81 MG EC tablet Take 1 tablet (81 mg total) by mouth daily. Swallow whole.   benzonatate (TESSALON) 100 MG capsule Take 1 capsule by mouth three times daily as needed for cough (Patient not taking: Reported on 01/26/2021)   Blood Glucose Monitoring Suppl (ONE TOUCH ULTRA 2) w/Device KIT Use to check sugar daily for type 2 diabetes E11.9   clonazePAM (KLONOPIN) 0.5 MG tablet Take 1 tablet (0.5 mg total) by mouth 2 (two) times daily as needed.   clopidogrel (PLAVIX) 75 MG tablet Take 1 tablet (75 mg total) by mouth daily.   ezetimibe (ZETIA) 10 MG tablet Take 1 tablet by mouth once daily   fenofibrate 160 MG tablet Take 1 tablet (160 mg total) by mouth daily.   gabapentin (NEURONTIN) 600 MG tablet Take one tablet up to 4 times a day   glucose blood (ONETOUCH ULTRA) test strip Use to check blood sugar daily for type 2 diabetes E11.9   Iron, Ferrous Sulfate, 325 (65 Fe) MG TABS Take 325 mg by mouth daily.   Lancets (ONETOUCH ULTRASOFT) lancets Use to check sugar daily for type 2 diabetes E11.9   lisinopril (ZESTRIL) 10 MG tablet Take 10 mg by mouth  daily.   meloxicam (MOBIC) 15 MG tablet TAKE 1 TABLET BY MOUTH ONCE DAILY AS NEEDED FOR PAIN   metFORMIN (GLUCOPHAGE-XR) 500 MG 24 hr tablet Take 1 tablet (500 mg total) by mouth 2 (two) times daily with a meal.   metoprolol succinate (TOPROL-XL) 25 MG 24 hr tablet Take 1 tablet (25 mg total) by mouth daily.   montelukast (SINGULAIR) 10 MG tablet Take 1 tablet (10 mg total) by mouth at bedtime.   NARCAN 4 MG/0.1ML LIQD nasal spray kit Place 0.4 mg into the nose once.    ondansetron (ZOFRAN ODT) 4 MG disintegrating tablet Take 1 tablet (4 mg total) by mouth every 8 (eight) hours as needed for nausea or vomiting. (Patient not taking: Reported on 05/09/2021)   oxyCODONE-acetaminophen (PERCOCET) 10-325 MG tablet One tablet every 4-6 hours as needed.   pantoprazole (PROTONIX) 20 MG tablet Take 1 tablet (20 mg total) by mouth daily.   sertraline (ZOLOFT) 50 MG tablet Take 1 tablet (50 mg total) by mouth daily.   sucralfate (CARAFATE) 1 g tablet TAKE 1 TABLET BY MOUTH 4 TIMES DAILY (WITH MEALS AND AT BEDTIME)   Tiotropium Bromide-Olodaterol 2.5-2.5 MCG/ACT AERS Inhale 2 puffs into the lungs daily.   vitamin B-12 (CYANOCOBALAMIN) 1000 MCG tablet Take 1 tablet (1,000 mcg total) by mouth daily.   zaleplon (SONATA) 10 MG capsule TAKE 1  CAPSULE BY MOUTH AT BEDTIME. MAY TAKE SECOND CAPSULE AFTER 2 HOURS IF NEEDED   No facility-administered medications prior to visit.    Review of Systems  {Labs  Heme  Chem  Endocrine  Serology  Results Review (optional):23779}   Objective    There were no vitals taken for this visit. {Show previous vital signs (optional):23777}  Physical Exam  ***  No results found for any visits on 05/30/21.  Assessment & Plan     ***  No follow-ups on file.      {provider attestation***:1}   Lelon Huh, MD  [facility] [phone] (phone) [phone] (fax)  [place]

## 2021-05-30 ENCOUNTER — Ambulatory Visit: Payer: Medicare Other | Admitting: Family Medicine

## 2021-06-01 ENCOUNTER — Telehealth: Payer: Self-pay | Admitting: *Deleted

## 2021-06-01 ENCOUNTER — Other Ambulatory Visit: Payer: Self-pay | Admitting: Emergency Medicine

## 2021-06-01 DIAGNOSIS — C3491 Malignant neoplasm of unspecified part of right bronchus or lung: Secondary | ICD-10-CM

## 2021-06-01 NOTE — Telephone Encounter (Signed)
Copied from [place] [id number]. Topic: Referral - Request for Referral >> Jun 01, 2021 11:30 AM Alanda Slim E wrote: Has patient seen PCP for this complaint? yes *If NO, is insurance requiring patient see PCP for this issue before PCP can refer them? Referral for which specialty: physical therapy and home health  Preferred provider/office:  Reason for referral: Needs PT to get home health services covered by insurance

## 2021-06-04 ENCOUNTER — Telehealth: Payer: Self-pay

## 2021-06-04 DIAGNOSIS — Z7982 Long term (current) use of aspirin: Secondary | ICD-10-CM | POA: Diagnosis not present

## 2021-06-04 DIAGNOSIS — I69393 Ataxia following cerebral infarction: Secondary | ICD-10-CM | POA: Diagnosis not present

## 2021-06-04 DIAGNOSIS — E785 Hyperlipidemia, unspecified: Secondary | ICD-10-CM | POA: Diagnosis not present

## 2021-06-04 DIAGNOSIS — Z7984 Long term (current) use of oral hypoglycemic drugs: Secondary | ICD-10-CM | POA: Diagnosis not present

## 2021-06-04 DIAGNOSIS — J4530 Mild persistent asthma, uncomplicated: Secondary | ICD-10-CM | POA: Diagnosis not present

## 2021-06-04 DIAGNOSIS — E119 Type 2 diabetes mellitus without complications: Secondary | ICD-10-CM | POA: Diagnosis not present

## 2021-06-04 DIAGNOSIS — I69351 Hemiplegia and hemiparesis following cerebral infarction affecting right dominant side: Secondary | ICD-10-CM | POA: Diagnosis not present

## 2021-06-04 DIAGNOSIS — Z7902 Long term (current) use of antithrombotics/antiplatelets: Secondary | ICD-10-CM | POA: Diagnosis not present

## 2021-06-04 DIAGNOSIS — N390 Urinary tract infection, site not specified: Secondary | ICD-10-CM | POA: Diagnosis not present

## 2021-06-04 DIAGNOSIS — Z9181 History of falling: Secondary | ICD-10-CM | POA: Diagnosis not present

## 2021-06-04 DIAGNOSIS — Z792 Long term (current) use of antibiotics: Secondary | ICD-10-CM | POA: Diagnosis not present

## 2021-06-04 DIAGNOSIS — I10 Essential (primary) hypertension: Secondary | ICD-10-CM | POA: Diagnosis not present

## 2021-06-04 NOTE — Telephone Encounter (Signed)
Copied from [place] [id number]. Topic: General - Other ?>> Jun 04, 2021  3:50 PM Yvette Rack wrote: ?Reason for CRM: Christy with [facility] reports patient had a trip and fall and has pain on back left side near her ribs. Cb# [phone] ?

## 2021-06-05 ENCOUNTER — Ambulatory Visit: Payer: Self-pay | Admitting: *Deleted

## 2021-06-05 DIAGNOSIS — N390 Urinary tract infection, site not specified: Secondary | ICD-10-CM | POA: Diagnosis not present

## 2021-06-05 DIAGNOSIS — Z7902 Long term (current) use of antithrombotics/antiplatelets: Secondary | ICD-10-CM | POA: Diagnosis not present

## 2021-06-05 DIAGNOSIS — I10 Essential (primary) hypertension: Secondary | ICD-10-CM | POA: Diagnosis not present

## 2021-06-05 DIAGNOSIS — Z7982 Long term (current) use of aspirin: Secondary | ICD-10-CM | POA: Diagnosis not present

## 2021-06-05 DIAGNOSIS — Z7984 Long term (current) use of oral hypoglycemic drugs: Secondary | ICD-10-CM | POA: Diagnosis not present

## 2021-06-05 DIAGNOSIS — E119 Type 2 diabetes mellitus without complications: Secondary | ICD-10-CM | POA: Diagnosis not present

## 2021-06-05 DIAGNOSIS — J4530 Mild persistent asthma, uncomplicated: Secondary | ICD-10-CM | POA: Diagnosis not present

## 2021-06-05 DIAGNOSIS — I69393 Ataxia following cerebral infarction: Secondary | ICD-10-CM | POA: Diagnosis not present

## 2021-06-05 DIAGNOSIS — Z9181 History of falling: Secondary | ICD-10-CM | POA: Diagnosis not present

## 2021-06-05 DIAGNOSIS — E785 Hyperlipidemia, unspecified: Secondary | ICD-10-CM | POA: Diagnosis not present

## 2021-06-05 DIAGNOSIS — I69351 Hemiplegia and hemiparesis following cerebral infarction affecting right dominant side: Secondary | ICD-10-CM | POA: Diagnosis not present

## 2021-06-05 DIAGNOSIS — Z792 Long term (current) use of antibiotics: Secondary | ICD-10-CM | POA: Diagnosis not present

## 2021-06-05 NOTE — Telephone Encounter (Signed)
Attempted to call patient - no answer - left message to call office ?Attempted to call Yvette Riley- no answer- left message to call office ?( FYI: Triage call started and in call back que) ?

## 2021-06-05 NOTE — Telephone Encounter (Signed)
Left message to call back, if [facility] or patient returns call pleas have [facility] nurse triage patient for fall and schedule a follow up appt with PCP to address. KW ?

## 2021-06-05 NOTE — Telephone Encounter (Signed)
Yvette Rileycalling. Saw pt yesterday. Pt stated she tripped and fell Sunday. Reports Left sided rib pain, at back, flank area. States no bruising, tender to touch. No other injuries. Pt able to do PT and OT.  ?Pt does not have a working phone. Yvette states she just did a drive by as they had not been able to reach her. States "Dead zone" No land line. She advised pt to apply ice. States her son will bring her ice packs. ?Unable to triage patient.   ?Reason for Disposition ?? [1] Caller requesting NON-URGENT health information AND [2] PCP's office is the best resource ? ?Protocols used: Information Only Call - No Triage-A-AH ? ?

## 2021-06-05 NOTE — Telephone Encounter (Signed)
Unable to contact patient- multiple attempts- left message to call office- see triage note ?

## 2021-06-05 NOTE — Telephone Encounter (Signed)
Third attempt to reach patient for triage-no answer- left message to call office ?

## 2021-06-05 NOTE — Telephone Encounter (Signed)
Attempted to call patient- no answer- left message to call office regarding recent fall and pain ?

## 2021-06-05 NOTE — Telephone Encounter (Signed)
Copied from [place] [id number]. Topic: General - Other ?>> Jun 04, 2021  3:50 PM Yvette Rack wrote: ?Reason for CRM: Christy with [facility] reports patient had a trip and fall and has pain on back left side near her ribs. Cb# [phone] ? ?Attempted to call patient- no answer- left message ?Attempted to call Alyse Low- left message to call office ?

## 2021-06-06 ENCOUNTER — Telehealth: Payer: Self-pay | Admitting: Family Medicine

## 2021-06-06 ENCOUNTER — Other Ambulatory Visit: Payer: Self-pay | Admitting: Family Medicine

## 2021-06-06 ENCOUNTER — Inpatient Hospital Stay: Admission: RE | Admit: 2021-06-06 | Payer: Medicare Other | Source: Ambulatory Visit

## 2021-06-06 DIAGNOSIS — G8929 Other chronic pain: Secondary | ICD-10-CM

## 2021-06-06 DIAGNOSIS — M5126 Other intervertebral disc displacement, lumbar region: Secondary | ICD-10-CM

## 2021-06-06 DIAGNOSIS — F5101 Primary insomnia: Secondary | ICD-10-CM

## 2021-06-06 NOTE — Telephone Encounter (Signed)
rx was sent to pharmacy on 03/29/21 #360/2 ?Requested Prescriptions  ?Pending Prescriptions Disp Refills  ?? oxyCODONE-acetaminophen (PERCOCET) 10-325 MG tablet 180 tablet 0  ?  Sig: One tablet every 4-6 hours as needed.  ?  ? Not Delegated - Analgesics:  Opioid Agonist Combinations Failed - 06/06/2021  1:44 PM  ?  ?  Failed - This refill cannot be delegated  ?  ?  Failed - Urine Drug Screen completed in last 360 days  ?  ?  Passed - Valid encounter within last 3 months  ?  Recent Outpatient Visits   ?      ? 4 weeks ago Shortness of breath  ? [facility] [place], [place], [place]  ? 1 month ago Burning with urination  ? [facility] Birdie Sons, MD  ? 4 months ago Type 2 diabetes mellitus with microalbuminuria, without long-term current use of insulin ([place])  ? [facility] Birdie Sons, MD  ? 8 months ago Type 2 diabetes mellitus with microalbuminuria, without long-term current use of insulin ([place])  ? [facility] Caryn Section, Kirstie Peri, MD  ? 11 months ago Ataxia  ? [facility] Caryn Section, Kirstie Peri, MD  ?  ?  ?Future Appointments   ?        ? In 3 weeks Fisher, Kirstie Peri, MD [facility]  ?  ? ?  ?  ?  ?? zaleplon (SONATA) 10 MG capsule 30 capsule 5  ?  Sig: TAKE 1 CAPSULE BY MOUTH AT BEDTIME. MAY TAKE SECOND CAPSULE AFTER 2 HOURS IF NEEDED  ?  ? Not Delegated - Psychiatry:  Anxiolytics/Hypnotics Failed - 06/06/2021  1:44 PM  ?  ?  Failed - This refill cannot be delegated  ?  ?  Failed - Urine Drug Screen completed in last 360 days  ?  ?  Passed - Valid encounter within last 6 months  ?  Recent Outpatient Visits   ?      ? 4 weeks ago Shortness of breath  ? [facility] [place], [place], [place]  ? 1 month ago Burning with urination  ? [facility] Birdie Sons, MD  ? 4 months ago Type 2 diabetes mellitus with microalbuminuria, without long-term current use of insulin ([place])  ? [facility]  Birdie Sons, MD  ? 8 months ago Type 2 diabetes mellitus with microalbuminuria, without long-term current use of insulin ([place])  ? [facility] Caryn Section, Kirstie Peri, MD  ? 11 months ago Ataxia  ? [facility] Caryn Section, Kirstie Peri, MD  ?  ?  ?Future Appointments   ?        ? In 3 weeks Fisher, Kirstie Peri, MD [facility]  ?  ? ?  ?  ?  ?? gabapentin (NEURONTIN) 600 MG tablet 360 tablet 2  ?  Sig: Take one tablet up to 4 times a day  ?  ? Neurology: Anticonvulsants - gabapentin Passed - 06/06/2021  1:44 PM  ?  ?  Passed - Cr in normal range and within 360 days  ?  Creatinine  ?Date Value Ref Range Status  ?02/02/2012 0.68 0.60 - 1.30 mg/dL Final  ? ?Creatinine, Ser  ?Date Value Ref Range Status  ?05/16/2021 0.63 0.44 - 1.00 mg/dL Final  ?   ?  ?  Passed - Completed PHQ-2 or PHQ-9 in the last 360 days  ?  ?  Passed -  Valid encounter within last 12 months  ?  Recent Outpatient Visits   ?      ? 4 weeks ago Shortness of breath  ? [facility] [place], [place], [place]  ? 1 month ago Burning with urination  ? [facility] Birdie Sons, MD  ? 4 months ago Type 2 diabetes mellitus with microalbuminuria, without long-term current use of insulin ([place])  ? [facility] Birdie Sons, MD  ? 8 months ago Type 2 diabetes mellitus with microalbuminuria, without long-term current use of insulin ([place])  ? [facility] Caryn Section, Kirstie Peri, MD  ? 11 months ago Ataxia  ? [facility] Caryn Section, Kirstie Peri, MD  ?  ?  ?Future Appointments   ?        ? In 3 weeks Fisher, Kirstie Peri, MD [facility]  ?  ? ?  ?  ?  ? ? ?

## 2021-06-06 NOTE — Telephone Encounter (Signed)
Home Health Verbal Orders - Caller/Agency: [facility] ? ?Callback Number: [phone] ? ?Requesting OT/PT/Skilled Nursing/Social Work/Speech Therapy: OT ? ?Frequency: 1w4 ?

## 2021-06-06 NOTE — Telephone Encounter (Signed)
Pt. Called and reports she is feeling better from her fall. "I don't have any pain now. I have good neighbors that check on me." Instructed to call as needed. ?

## 2021-06-06 NOTE — Telephone Encounter (Signed)
Medication Refill - Medication: zaleplon (SONATA) 10 MG capsule, ?gabapentin (NEURONTIN) 600 MG tablet ? ?Pt is requesting Rx refills to be sent via mail as she does not have transportation.  ? ?Has the patient contacted their pharmacy? No. ? ?(Agent: If no, request that the patient contact the pharmacy for the refill. If patient does not wish to contact the pharmacy document the reason why and proceed with request.) ? ?Preferred Pharmacy (with phone number or street name):  ?[facility] - [place], [address] [place] [address]  ?Phone: [phone] Fax: [phone]  ?Hours: Not open 24 hours  ? ?Has the patient been seen for an appointment in the last year OR does the patient have an upcoming appointment? Yes.   ? ?Agent: Please be advised that RX refills may take up to 3 business days. We ask that you follow-up with your pharmacy.  ?

## 2021-06-06 NOTE — Telephone Encounter (Signed)
That's fine

## 2021-06-06 NOTE — Telephone Encounter (Signed)
Pt called back in to let pcp know she also needed this medication refilled  ? ?oxyCODONE-acetaminophen (PERCOCET) 10-325 MG tablet  ?

## 2021-06-06 NOTE — Telephone Encounter (Signed)
Requested medication (s) are due for refill today: yes ? ?Requested medication (s) are on the active medication list: yes ? ?Last refill:  percocet 05/14/21 #180/0, zaleplon 04/04/21 #30/5 ? ?Future visit scheduled: yes ? ?Notes to clinic:  Unable to refill per protocol, cannot delegate. ? ?  ?Requested Prescriptions  ?Pending Prescriptions Disp Refills  ? oxyCODONE-acetaminophen (PERCOCET) 10-325 MG tablet 180 tablet 0  ?  Sig: One tablet every 4-6 hours as needed.  ?  ? Not Delegated - Analgesics:  Opioid Agonist Combinations Failed - 06/06/2021  1:44 PM  ?  ?  Failed - This refill cannot be delegated  ?  ?  Failed - Urine Drug Screen completed in last 360 days  ?  ?  Passed - Valid encounter within last 3 months  ?  Recent Outpatient Visits   ? ?      ? 4 weeks ago Shortness of breath  ? [facility] [place], [place], [place]  ? 1 month ago Burning with urination  ? [facility] Birdie Sons, MD  ? 4 months ago Type 2 diabetes mellitus with microalbuminuria, without long-term current use of insulin ([place])  ? [facility] Birdie Sons, MD  ? 8 months ago Type 2 diabetes mellitus with microalbuminuria, without long-term current use of insulin ([place])  ? [facility] Caryn Section, Kirstie Peri, MD  ? 11 months ago Ataxia  ? [facility] Caryn Section, Kirstie Peri, MD  ? ?  ?  ?Future Appointments   ? ?        ? In 3 weeks Fisher, Kirstie Peri, MD [facility]  ? ?  ? ? ?  ?  ?  ? zaleplon (SONATA) 10 MG capsule 30 capsule 5  ?  Sig: TAKE 1 CAPSULE BY MOUTH AT BEDTIME. MAY TAKE SECOND CAPSULE AFTER 2 HOURS IF NEEDED  ?  ? Not Delegated - Psychiatry:  Anxiolytics/Hypnotics Failed - 06/06/2021  1:44 PM  ?  ?  Failed - This refill cannot be delegated  ?  ?  Failed - Urine Drug Screen completed in last 360 days  ?  ?  Passed - Valid encounter within last 6 months  ?  Recent Outpatient Visits   ? ?      ? 4 weeks ago Shortness of breath  ? [facility] [place], [place], [place]  ? 1 month ago Burning with urination  ? [facility] Birdie Sons, MD  ? 4 months ago Type 2 diabetes mellitus with microalbuminuria, without long-term current use of insulin ([place])  ? [facility] Birdie Sons, MD  ? 8 months ago Type 2 diabetes mellitus with microalbuminuria, without long-term current use of insulin ([place])  ? [facility] Caryn Section, Kirstie Peri, MD  ? 11 months ago Ataxia  ? [facility] Caryn Section, Kirstie Peri, MD  ? ?  ?  ?Future Appointments   ? ?        ? In 3 weeks Fisher, Kirstie Peri, MD [facility]  ? ?  ? ? ?  ?  ?  ?Refused Prescriptions Disp Refills  ? gabapentin (NEURONTIN) 600 MG tablet 360 tablet 2  ?  Sig: Take one tablet up to 4 times a day  ?  ? Neurology: Anticonvulsants - gabapentin Passed - 06/06/2021  1:44 PM  ?  ?  Passed - Cr in normal range and within 360 days  ?  Creatinine  ?  Date Value Ref Range Status  ?02/02/2012 0.68 0.60 - 1.30 mg/dL Final  ? ?Creatinine, Ser  ?Date Value Ref Range Status  ?05/16/2021 0.63 0.44 - 1.00 mg/dL Final  ?  ?  ?  ?  Passed - Completed PHQ-2 or PHQ-9 in the last 360 days  ?  ?  Passed - Valid encounter within last 12 months  ?  Recent Outpatient Visits   ? ?      ? 4 weeks ago Shortness of breath  ? [facility], [place], [place]  ? 1 month ago Burning with urination  ? [facility] Birdie Sons, MD  ? 4 months ago Type 2 diabetes mellitus with microalbuminuria, without long-term current use of insulin ([place])  ? [facility] Birdie Sons, MD  ? 8 months ago Type 2 diabetes mellitus with microalbuminuria, without long-term current use of insulin ([place])  ? [facility] Caryn Section, Kirstie Peri, MD  ? 11 months ago Ataxia  ? [facility] Caryn Section, Kirstie Peri, MD  ? ?  ?  ?Future Appointments   ? ?        ? In 3 weeks Fisher, Kirstie Peri, MD [facility]  ? ?  ? ? ?  ?  ?   ? ?

## 2021-06-07 NOTE — Telephone Encounter (Signed)
Left detailed message advising of approved verbal orders.  ?

## 2021-06-07 NOTE — Telephone Encounter (Signed)
FYI Patient reports she is okay and is not in any increased pain and denies any concerns at this time.  ?

## 2021-06-08 ENCOUNTER — Telehealth: Payer: Self-pay

## 2021-06-08 DIAGNOSIS — E119 Type 2 diabetes mellitus without complications: Secondary | ICD-10-CM | POA: Diagnosis not present

## 2021-06-08 LAB — HM DIABETES EYE EXAM

## 2021-06-08 NOTE — Telephone Encounter (Signed)
Patient called and stated that her eye doctor, Dr. Leory Plowman at [facility] in [place] , told her that she needed to go to the ER for surgery for on her eyes. She states that she wants advise from her PCP because she is unable to got to the hospital at this time because she feels it is unnecessary. She states she has trouble seeing,but not in pain. Please give pt a call back to discuss. ?

## 2021-06-10 MED ORDER — OXYCODONE-ACETAMINOPHEN 10-325 MG PO TABS: ORAL_TABLET | ORAL | 0 refills | Status: DC

## 2021-06-10 MED ORDER — ZALEPLON 10 MG PO CAPS: ORAL_CAPSULE | ORAL | 5 refills | Status: DC

## 2021-06-10 MED ORDER — GABAPENTIN 600 MG PO TABS: ORAL_TABLET | ORAL | 2 refills | Status: DC

## 2021-06-11 ENCOUNTER — Other Ambulatory Visit: Payer: Self-pay | Admitting: Family Medicine

## 2021-06-11 NOTE — Telephone Encounter (Signed)
Attempted to reach pt for more details and how she is doing. LMTRC to office.  Okay for [facility] triage to inquire.   ?

## 2021-06-11 NOTE — Telephone Encounter (Signed)
Copied from [place] [phone]. Topic: General - Other ?>> Jun 11, 2021 11:31 AM Tessa Lerner A wrote: ?Reason for CRM: Medication Refill - Medication: albuterol (PROVENTIL HFA) 108 (90 Base) MCG/ACT inhaler [[id number]]  ? ?Has the patient contacted their pharmacy? Yes.  The patient's pharmacy has made contact on their behalf  ?(Agent: If no, request that the patient contact the pharmacy for the refill. If patient does not wish to contact the pharmacy document the reason why and proceed with request.) ?(Agent: If yes, when and what did the pharmacy advise?) ? ?Preferred Pharmacy (with phone number or street name): [facility] - [place], [address] [place] [address] ?Phone: [phone] Fax: [phone] ?Hours: Not open 24 hours ? ? ?Has the patient been seen for an appointment in the last year OR does the patient have an upcoming appointment? Yes.   ? ?Agent: Please be advised that RX refills may take up to 3 business days. We ask that you follow-up with your pharmacy. ?

## 2021-06-11 NOTE — Telephone Encounter (Signed)
Pt. States her eye doctor wants her to go to "ER to have MRI and CT scan but he didn't tell me why." States she had vision changes to both eyes, "but I don't have any pain with my eyes.I can't get to the emergency room. I have an appointment to see my eye doctor 07/03/21." Pt. Asking if Dr. Caryn Section can speak with her eye doctor to see why he wants her to go to ED. Please advise pt. ?

## 2021-06-11 NOTE — Telephone Encounter (Signed)
I called and spoke with patient. I advised her to contact her eye doctor and request to speak with their nursing staff to find out, and have them explain her eye doctors recommendations. Patient verbalized understanding and agreed to call their office.  ?

## 2021-06-12 MED ORDER — ALBUTEROL SULFATE HFA 108 (90 BASE) MCG/ACT IN AERS: 2.0000 | INHALATION_SPRAY | Freq: Four times a day (QID) | RESPIRATORY_TRACT | 3 refills | Status: DC | PRN

## 2021-06-12 NOTE — Telephone Encounter (Signed)
Requested Prescriptions  ?Pending Prescriptions Disp Refills  ?? albuterol (PROVENTIL HFA) 108 (90 Base) MCG/ACT inhaler 54 g 3  ?  Sig: Inhale 2 puffs into the lungs every 6 (six) hours as needed for wheezing or shortness of breath.  ?  ? Pulmonology:  Beta Agonists 2 Passed - 06/11/2021  3:01 PM  ?  ?  Passed - Last BP in normal range  ?  BP Readings from Last 1 Encounters:  ?05/16/21 134/72  ?   ?  ?  Passed - Last Heart Rate in normal range  ?  Pulse Readings from Last 1 Encounters:  ?05/16/21 89  ?   ?  ?  Passed - Valid encounter within last 12 months  ?  Recent Outpatient Visits   ?      ? 1 month ago Shortness of breath  ? [facility] [place], [place], [place]  ? 1 month ago Burning with urination  ? [facility] Birdie Sons, MD  ? 4 months ago Type 2 diabetes mellitus with microalbuminuria, without long-term current use of insulin ([place])  ? [facility] Birdie Sons, MD  ? 8 months ago Type 2 diabetes mellitus with microalbuminuria, without long-term current use of insulin ([place])  ? [facility] Caryn Section, Kirstie Peri, MD  ? 11 months ago Ataxia  ? [facility] Caryn Section, Kirstie Peri, MD  ?  ?  ?Future Appointments   ?        ? In 2 weeks Fisher, Kirstie Peri, MD [facility]  ?  ? ?  ?  ?  ? ?

## 2021-06-13 ENCOUNTER — Telehealth: Payer: Self-pay

## 2021-06-13 NOTE — Telephone Encounter (Signed)
Yvette Riley called from [facility] in regards to a prescription for Diclofenac gel 1% and Metoprolol 25mg  that was faxed to the office yesterday. She is calling to check the status of refill. Yvette Riley contact number is [phone]) [phone]. Please advise. ?

## 2021-06-14 ENCOUNTER — Telehealth: Payer: Self-pay | Admitting: *Deleted

## 2021-06-14 NOTE — Telephone Encounter (Addendum)
Patient called and left message to return her call regarding an appointment before 5 today or tomorrow

## 2021-06-15 ENCOUNTER — Telehealth: Payer: Self-pay

## 2021-06-15 ENCOUNTER — Encounter: Payer: Self-pay | Admitting: Family Medicine

## 2021-06-15 ENCOUNTER — Other Ambulatory Visit: Payer: Self-pay | Admitting: Family Medicine

## 2021-06-15 DIAGNOSIS — F439 Reaction to severe stress, unspecified: Secondary | ICD-10-CM

## 2021-06-15 DIAGNOSIS — F419 Anxiety disorder, unspecified: Secondary | ICD-10-CM

## 2021-06-15 DIAGNOSIS — F32A Depression, unspecified: Secondary | ICD-10-CM

## 2021-06-15 MED ORDER — SERTRALINE HCL 50 MG PO TABS: 50.0000 mg | ORAL_TABLET | Freq: Every day | ORAL | 4 refills | Status: AC

## 2021-06-15 MED ORDER — UMECLIDINIUM-VILANTEROL 62.5-25 MCG/ACT IN AEPB: 1.0000 | INHALATION_SPRAY | Freq: Every day | RESPIRATORY_TRACT | 4 refills | Status: DC

## 2021-06-15 MED ORDER — METOPROLOL SUCCINATE ER 25 MG PO TB24: 25.0000 mg | ORAL_TABLET | Freq: Every day | ORAL | 3 refills | Status: DC

## 2021-06-15 NOTE — Telephone Encounter (Signed)
Copied from [place] [id number]. Topic: General - Other >> Jun 15, 2021 11:57 AM Bayard Beaver wrote: Reason for CRM:pt called in needing assistance with filling out paperwork at home because she is having problem writing because of problems with her hand from previous stroke. Please call back to assist

## 2021-06-15 NOTE — Telephone Encounter (Signed)
Erroneous encounter

## 2021-06-18 ENCOUNTER — Telehealth: Payer: Self-pay

## 2021-06-18 DIAGNOSIS — C3491 Malignant neoplasm of unspecified part of right bronchus or lung: Secondary | ICD-10-CM

## 2021-06-18 DIAGNOSIS — R809 Proteinuria, unspecified: Secondary | ICD-10-CM

## 2021-06-18 NOTE — Telephone Encounter (Signed)
Copied from [place] [id number]. Topic: General - Other >> Jun 18, 2021  3:03 PM Valere Dross wrote: Reason for CRM: Pts insurance [employer] rep Tyrone Schimke [phone] called in stating pt reached out to them about getting some in home health services, and requested if PCP could put the orders in, please advise. >> Jun 18, 2021  4:24 PM McGill, Nelva Bush wrote: Pt is calling to follow up on home health services, and pt is requesting transportation for her upcoming visit on 07/02/2021 @ 3:00PM .

## 2021-06-18 NOTE — Telephone Encounter (Signed)
Please advise Home health orders?

## 2021-06-19 MED ORDER — DICLOFENAC SODIUM 1 % EX GEL: 4.0000 g | Freq: Four times a day (QID) | CUTANEOUS | 3 refills | Status: AC

## 2021-06-19 NOTE — Telephone Encounter (Signed)
Please advise if there are any resources available to help patient with this issue.

## 2021-06-19 NOTE — Addendum Note (Signed)
Addended by: Birdie Sons on: 06/19/2021 01:14 PM   Modules accepted: Orders

## 2021-06-19 NOTE — Telephone Encounter (Signed)
That's fine

## 2021-06-19 NOTE — Telephone Encounter (Signed)
Has already been done.

## 2021-06-21 NOTE — Telephone Encounter (Signed)
Tried returning [facility] rep Tawn's call. Left detailed message on secure voice message system advising of Dr. Maralyn Sago response to home health request.   Please reach out to patient regarding transportation needs for an upcoming appointment.

## 2021-06-22 NOTE — Telephone Encounter (Signed)
Please advise if ok to place new referral order for transportation assistance. I have an order pending in this encounter. Please review for accuracy.

## 2021-06-27 ENCOUNTER — Telehealth: Payer: Self-pay

## 2021-06-27 NOTE — Telephone Encounter (Signed)
Please Disregard opened in error

## 2021-06-27 NOTE — Telephone Encounter (Signed)
   Telephone encounter was:  Unsuccessful.  06/27/2021 Name: Yvette Riley MRN: [id number] DOB: [birth year]  Unsuccessful outbound call made today to assist with:  Transportation Needs   Outreach Attempt:  1st Attempt  A HIPAA compliant voice message was left requesting a return call.  Instructed patient to call back at earliest convenience. [place], Care Management  [phone] [address], [place], [place] [address] Phone: [phone] Email: [email or web address]

## 2021-06-28 ENCOUNTER — Ambulatory Visit: Admission: RE | Admit: 2021-06-28 | Payer: Medicare Other | Source: Ambulatory Visit

## 2021-06-28 ENCOUNTER — Telehealth: Payer: Self-pay | Admitting: Oncology

## 2021-06-28 NOTE — Telephone Encounter (Signed)
Per transportation, patient was unaware of her CT scan today and requested to reschedule. Scan has been moved to 6/8 and MD appt to 6/14 to allow time for results.   Attempts x 2 to reach patient by phone. Left VM with updated appointment information, including Lucianne Lei pick up and scan prep instructions. Requested she call back to confirm.

## 2021-06-29 ENCOUNTER — Telehealth: Payer: Self-pay

## 2021-06-29 NOTE — Telephone Encounter (Signed)
   Telephone encounter was:  Unsuccessful.  06/29/2021 Name: Yvette Riley MRN: [id number] DOB: Jun 02, 1955  Unsuccessful outbound call made today to assist with:  Transportation Needs   Outreach Attempt:  2nd Attempt  A HIPAA compliant voice message was left requesting a return call.  Instructed patient to call back at  earliest convenience.  [place], Care Management  [phone] [address], [place], [place] [address] Phone: [phone] Email: [email or web address]

## 2021-07-02 ENCOUNTER — Telehealth: Payer: Self-pay

## 2021-07-02 ENCOUNTER — Ambulatory Visit: Payer: Medicare Other | Admitting: Family Medicine

## 2021-07-02 NOTE — Telephone Encounter (Signed)
   Telephone encounter was:  Unsuccessful.  07/02/2021 Name: Yvette Riley MRN: [id number] DOB: Sep 01, 1955  Unsuccessful outbound call made today to assist with:  Transportation Needs   Outreach Attempt:  3rd Attempt.  Referral closed unable to contact patient.  A HIPAA compliant voice message was left requesting a return call.  Instructed patient to call back at  [employer]. [place], Care Management  [phone] [address], [place], [place] [address] Phone: [phone] Email: [email or web address]

## 2021-07-02 NOTE — Progress Notes (Deleted)
Established patient visit   Patient: Yvette Riley   DOB: [birth year]   66 y.o. Female  MRN: [id number] Visit Date: 07/02/2021  Today's healthcare provider: Lelon Huh, MD   No chief complaint on file.  Subjective    HPI  Anxiety, Follow-up  She was last seen for anxiety 1 months ago. Changes made at last visit include ***.   She reports {excellent/good/fair/poor:19665} compliance with treatment. She reports {good/fair/poor:18685} tolerance of treatment. She {is/is not:21021397} having side effects. {document side effects if present:1}  She feels her anxiety is {Desc; severity:60313} and {improved/worse/unchanged:3041574} since last visit.  Symptoms: {Yes/No:20286} chest pain {Yes/No:20286} difficulty concentrating  {Yes/No:20286} dizziness {Yes/No:20286} fatigue  {Yes/No:20286} feelings of losing control {Yes/No:20286} insomnia  {Yes/No:20286} irritable {Yes/No:20286} palpitations  {Yes/No:20286} panic attacks {Yes/No:20286} racing thoughts  {Yes/No:20286} shortness of breath {Yes/No:20286} sweating  {Yes/No:20286} tremors/shakes    GAD-7 Results     View : No data to display.          PHQ-9 Scores    01/26/2021   11:07 AM 12/13/2020    3:38 PM 09/25/2020    1:29 PM  PHQ9 SCORE ONLY  PHQ-9 Total Score _0 Depression, Follow-up  She  was last seen for this 1 months ago. Changes made at last visit include ***.   She reports {excellent/good/fair/poor:19665} compliance with treatment. She {is/is not:21021397} having side effects. ***  She reports {DESC; GOOD/FAIR/POOR:18685} tolerance of treatment. Current symptoms include: {Symptoms; depression:1002} She feels she is {improved/worse/unchanged:3041574} since last visit.     01/26/2021   11:07 AM 12/13/2020    3:38 PM 09/25/2020    1:29 PM  Depression screen PHQ 2/9  Decreased Interest _1 Down, Depressed, Hopeless 2 3 0  PHQ - 2 Score _2 Altered sleeping _3 Tired,  decreased energy _4 Change in appetite 2 0 1  Feeling bad or failure about yourself  2 0 0  Trouble concentrating 2 0 1  Moving slowly or fidgety/restless 2 0 0  Suicidal thoughts 0 0 0  PHQ-9 Score _5 Difficult doing work/chores Extremely dIfficult Not difficult at all Very difficult    -----------------------------------------------------------------------------------------  ---------------------------------------------------------------------------------------------------   Medications: Outpatient Medications Prior to Visit  Medication Sig   albuterol (PROVENTIL HFA) 108 (90 Base) MCG/ACT inhaler Inhale 2 puffs into the lungs every 6 (six) hours as needed for wheezing or shortness of breath.   Alirocumab (PRALUENT) 150 MG/ML SOAJ Inject 1 mL into the skin every 14 (fourteen) days.   aspirin 81 MG EC tablet Take 1 tablet (81 mg total) by mouth daily. Swallow whole.   benzonatate (TESSALON) 100 MG capsule Take 1 capsule by mouth three times daily as needed for cough (Patient not taking: Reported on 01/26/2021)   Blood Glucose Monitoring Suppl (ONE TOUCH ULTRA 2) w/Device KIT Use to check sugar daily for type 2 diabetes E11.9   clonazePAM (KLONOPIN) 0.5 MG tablet Take 1 tablet (0.5 mg total) by mouth 2 (two) times daily as needed.   clopidogrel (PLAVIX) 75 MG tablet Take 1 tablet (75 mg total) by mouth daily.   diclofenac Sodium (VOLTAREN) 1 % GEL Apply 4 g topically 4 (four) times daily.   ezetimibe (ZETIA) 10 MG tablet Take 1 tablet by mouth once daily   fenofibrate 160 MG tablet Take 1 tablet (160 mg total) by mouth daily.   gabapentin (NEURONTIN) 600 MG tablet Take one tablet  up to 4 times a day   glucose blood (ONETOUCH ULTRA) test strip Use to check blood sugar daily for type 2 diabetes E11.9   Iron, Ferrous Sulfate, 325 (65 Fe) MG TABS Take 325 mg by mouth daily.   Lancets (ONETOUCH ULTRASOFT) lancets Use to check sugar daily for type 2 diabetes E11.9   lisinopril  (ZESTRIL) 10 MG tablet Take 10 mg by mouth daily.   meloxicam (MOBIC) 15 MG tablet TAKE 1 TABLET BY MOUTH ONCE DAILY AS NEEDED FOR PAIN   metFORMIN (GLUCOPHAGE-XR) 500 MG 24 hr tablet Take 1 tablet (500 mg total) by mouth 2 (two) times daily with a meal.   metoprolol succinate (TOPROL-XL) 25 MG 24 hr tablet Take 1 tablet (25 mg total) by mouth daily.   montelukast (SINGULAIR) 10 MG tablet Take 1 tablet (10 mg total) by mouth at bedtime.   NARCAN 4 MG/0.1ML LIQD nasal spray kit Place 0.4 mg into the nose once.    ondansetron (ZOFRAN ODT) 4 MG disintegrating tablet Take 1 tablet (4 mg total) by mouth every 8 (eight) hours as needed for nausea or vomiting. (Patient not taking: Reported on 05/09/2021)   oxyCODONE-acetaminophen (PERCOCET) 10-325 MG tablet One tablet every 4-6 hours as needed.   pantoprazole (PROTONIX) 20 MG tablet Take 1 tablet (20 mg total) by mouth daily.   sertraline (ZOLOFT) 50 MG tablet Take 1 tablet (50 mg total) by mouth daily.   sucralfate (CARAFATE) 1 g tablet TAKE 1 TABLET BY MOUTH 4 TIMES DAILY (WITH MEALS AND AT BEDTIME)   Tiotropium Bromide-Olodaterol 2.5-2.5 MCG/ACT AERS Inhale 2 puffs into the lungs daily.   umeclidinium-vilanterol (ANORO ELLIPTA) 62.5-25 MCG/ACT AEPB Inhale 1 puff into the lungs daily at 6 (six) AM.   vitamin B-12 (CYANOCOBALAMIN) 1000 MCG tablet Take 1 tablet (1,000 mcg total) by mouth daily.   zaleplon (SONATA) 10 MG capsule TAKE 1 CAPSULE BY MOUTH AT BEDTIME. MAY TAKE SECOND CAPSULE AFTER 2 HOURS IF NEEDED   No facility-administered medications prior to visit.    Review of Systems  {Labs  Heme  Chem  Endocrine  Serology  Results Review (optional):23779}   Objective    There were no vitals taken for this visit. {Show previous vital signs (optional):23777}  Physical Exam  ***  No results found for any visits on 07/02/21.  Assessment & Plan     ***  No follow-ups on file.      {provider attestation***:1}   Lelon Huh, MD   [facility] [phone] (phone) [phone] (fax)  [place]

## 2021-07-03 DIAGNOSIS — E119 Type 2 diabetes mellitus without complications: Secondary | ICD-10-CM | POA: Diagnosis not present

## 2021-07-04 ENCOUNTER — Ambulatory Visit: Payer: Self-pay | Admitting: *Deleted

## 2021-07-04 ENCOUNTER — Ambulatory Visit: Payer: Medicare Other | Admitting: Oncology

## 2021-07-04 ENCOUNTER — Other Ambulatory Visit: Payer: Self-pay | Admitting: Family Medicine

## 2021-07-04 DIAGNOSIS — G8929 Other chronic pain: Secondary | ICD-10-CM

## 2021-07-04 NOTE — Telephone Encounter (Addendum)
  Chief Complaint: fall- injury to R hip/leg, hand swollen Symptoms: pain, swelling Frequency: fell 2 days ago Pertinent Negatives: Patient denies   Disposition: [] ED /[] Urgent Care (no appt availability in office) / [] Appointment(In office/virtual)/ []  [place] [facility]/ [] Home Care/ [x] Refused Recommended Disposition /[] [place] [facility]/ []  Follow-up with PCP Additional Notes: Advised ED now- call 911 for transport- but patient declines due to no ride home from ED. Patient is at a friend's home now and friend will not drive her. Patient wants PCP to order x ray at imaging at office- patient advised ED now- unsure if she will go.

## 2021-07-04 NOTE — Telephone Encounter (Signed)
Reason for Disposition  Injury (or injuries) that need emergency care  Answer Assessment - Initial Assessment Questions 1. MECHANISM: "How did the fall happen?"    2 days ago 2. DOMESTIC VIOLENCE AND ELDER ABUSE SCREENING: "Did you fall because someone pushed you or tried to hurt you?" If Yes, ask: "Are you safe now?"       3. ONSET: "When did the fall happen?" (e.g., minutes, hours, or days ago)     2 days ago-  4. LOCATION: "What part of the body hit the ground?" (e.g., back, buttocks, head, hips, knees, hands, head, stomach)     R hip/leg pain, collar bone, hand swollen 5. INJURY: "Did you hurt (injure) yourself when you fell?" If Yes, ask: "What did you injure? Tell me more about this?" (e.g., body area; type of injury; pain severity)"     Yes- whole R side 6. PAIN: "Is there any pain?" If Yes, ask: "How bad is the pain?" (e.g., Scale 1-10; or mild,  moderate, severe)   - NONE (0): No pain   - MILD (1-3): Doesn't interfere with normal activities    - MODERATE (4-7): Interferes with normal activities or awakens from sleep    - SEVERE (8-10): Excruciating pain, unable to do any normal activities      severe 7. SIZE: For cuts, bruises, or swelling, ask: "How large is it?" (e.g., inches or centimeters)      *No Answer* 8. PREGNANCY: "Is there any chance you are pregnant?" "When was your last menstrual period?"     *No Answer* 9. OTHER SYMPTOMS: "Do you have any other symptoms?" (e.g., dizziness, fever, weakness; new onset or worsening).      *No Answer* 10. CAUSE: "What do you think caused the fall (or falling)?" (e.g., tripped, dizzy spell)       *No Answer*  Protocols used: [facility]

## 2021-07-05 ENCOUNTER — Other Ambulatory Visit: Payer: Self-pay | Admitting: Family Medicine

## 2021-07-05 ENCOUNTER — Ambulatory Visit: Admission: RE | Admit: 2021-07-05 | Payer: Medicare Other | Source: Ambulatory Visit

## 2021-07-05 DIAGNOSIS — Z7984 Long term (current) use of oral hypoglycemic drugs: Secondary | ICD-10-CM | POA: Diagnosis not present

## 2021-07-05 DIAGNOSIS — G8929 Other chronic pain: Secondary | ICD-10-CM | POA: Diagnosis not present

## 2021-07-05 DIAGNOSIS — S59901A Unspecified injury of right elbow, initial encounter: Secondary | ICD-10-CM | POA: Diagnosis not present

## 2021-07-05 DIAGNOSIS — S199XXA Unspecified injury of neck, initial encounter: Secondary | ICD-10-CM | POA: Diagnosis not present

## 2021-07-05 DIAGNOSIS — Z043 Encounter for examination and observation following other accident: Secondary | ICD-10-CM | POA: Diagnosis not present

## 2021-07-05 DIAGNOSIS — S32010A Wedge compression fracture of first lumbar vertebra, initial encounter for closed fracture: Secondary | ICD-10-CM | POA: Diagnosis not present

## 2021-07-05 DIAGNOSIS — S0990XA Unspecified injury of head, initial encounter: Secondary | ICD-10-CM | POA: Diagnosis not present

## 2021-07-05 DIAGNOSIS — M25521 Pain in right elbow: Secondary | ICD-10-CM | POA: Diagnosis not present

## 2021-07-05 DIAGNOSIS — E1129 Type 2 diabetes mellitus with other diabetic kidney complication: Secondary | ICD-10-CM

## 2021-07-05 DIAGNOSIS — I10 Essential (primary) hypertension: Secondary | ICD-10-CM | POA: Diagnosis not present

## 2021-07-05 DIAGNOSIS — M19011 Primary osteoarthritis, right shoulder: Secondary | ICD-10-CM | POA: Diagnosis not present

## 2021-07-05 DIAGNOSIS — I2510 Atherosclerotic heart disease of native coronary artery without angina pectoris: Secondary | ICD-10-CM | POA: Diagnosis not present

## 2021-07-05 DIAGNOSIS — M47812 Spondylosis without myelopathy or radiculopathy, cervical region: Secondary | ICD-10-CM | POA: Diagnosis not present

## 2021-07-05 DIAGNOSIS — M19021 Primary osteoarthritis, right elbow: Secondary | ICD-10-CM | POA: Diagnosis not present

## 2021-07-05 DIAGNOSIS — Z7982 Long term (current) use of aspirin: Secondary | ICD-10-CM | POA: Diagnosis not present

## 2021-07-05 DIAGNOSIS — S4991XA Unspecified injury of right shoulder and upper arm, initial encounter: Secondary | ICD-10-CM | POA: Diagnosis not present

## 2021-07-05 DIAGNOSIS — R9431 Abnormal electrocardiogram [ECG] [EKG]: Secondary | ICD-10-CM | POA: Diagnosis not present

## 2021-07-05 DIAGNOSIS — Z87891 Personal history of nicotine dependence: Secondary | ICD-10-CM | POA: Diagnosis not present

## 2021-07-05 DIAGNOSIS — S32019A Unspecified fracture of first lumbar vertebra, initial encounter for closed fracture: Secondary | ICD-10-CM | POA: Diagnosis not present

## 2021-07-05 DIAGNOSIS — I69351 Hemiplegia and hemiparesis following cerebral infarction affecting right dominant side: Secondary | ICD-10-CM | POA: Diagnosis not present

## 2021-07-05 DIAGNOSIS — M9931 Osseous stenosis of neural canal of cervical region: Secondary | ICD-10-CM | POA: Diagnosis not present

## 2021-07-05 DIAGNOSIS — E785 Hyperlipidemia, unspecified: Secondary | ICD-10-CM | POA: Diagnosis not present

## 2021-07-05 DIAGNOSIS — Z79899 Other long term (current) drug therapy: Secondary | ICD-10-CM | POA: Diagnosis not present

## 2021-07-05 DIAGNOSIS — Z885 Allergy status to narcotic agent status: Secondary | ICD-10-CM | POA: Diagnosis not present

## 2021-07-05 DIAGNOSIS — M545 Low back pain, unspecified: Secondary | ICD-10-CM | POA: Diagnosis not present

## 2021-07-05 DIAGNOSIS — Z791 Long term (current) use of non-steroidal anti-inflammatories (NSAID): Secondary | ICD-10-CM | POA: Diagnosis not present

## 2021-07-05 DIAGNOSIS — R9389 Abnormal findings on diagnostic imaging of other specified body structures: Secondary | ICD-10-CM | POA: Diagnosis not present

## 2021-07-05 DIAGNOSIS — Z7902 Long term (current) use of antithrombotics/antiplatelets: Secondary | ICD-10-CM | POA: Diagnosis not present

## 2021-07-05 DIAGNOSIS — E119 Type 2 diabetes mellitus without complications: Secondary | ICD-10-CM | POA: Diagnosis not present

## 2021-07-05 DIAGNOSIS — S59911A Unspecified injury of right forearm, initial encounter: Secondary | ICD-10-CM | POA: Diagnosis not present

## 2021-07-05 DIAGNOSIS — M546 Pain in thoracic spine: Secondary | ICD-10-CM | POA: Diagnosis not present

## 2021-07-05 DIAGNOSIS — Z880 Allergy status to penicillin: Secondary | ICD-10-CM | POA: Diagnosis not present

## 2021-07-05 DIAGNOSIS — J449 Chronic obstructive pulmonary disease, unspecified: Secondary | ICD-10-CM | POA: Diagnosis not present

## 2021-07-05 NOTE — Telephone Encounter (Signed)
LMTCB-ok for [facility] nurse to give provider's message

## 2021-07-05 NOTE — Telephone Encounter (Signed)
Needs office visit or go to urgent care

## 2021-07-06 NOTE — Telephone Encounter (Signed)
Patient called, left VM to return the call to the office. ED visit on 07/05/21, see chart.

## 2021-07-06 NOTE — Telephone Encounter (Signed)
Attempted to call patient with provider response- unable to leave message mailbox is full.

## 2021-07-10 ENCOUNTER — Telehealth: Payer: Self-pay | Admitting: Oncology

## 2021-07-10 NOTE — Telephone Encounter (Signed)
Pt was a No Show for Ct scan. Cancelled follow up with Dr. Grayland Ormond. I left Vm for pt to let her know that we cancelled the follow up appt and her for her to call us back to reschedule.

## 2021-07-10 NOTE — Progress Notes (Deleted)
[place]  Telephone:([phone]) [phone] [phone]) [phone]  ID: Yvette Riley OB: 08-25-1955  MR#: [id number]  UXN#:[id number]  Patient Care Team: Birdie Sons, MD as PCP - General (Family Medicine) Lorelee Cover., MD (Ophthalmology) Vladimir Crofts, MD as Consulting Physician (Neurology) Dionisio David, MD as Consulting Physician (Cardiology) Lloyd Huger, MD as Consulting Physician (Oncology) Noreene Filbert, MD as Referring Physician (Radiation Oncology)  CHIEF COMPLAINT: Clinical stage IIIB small cell lung cancer of the right lung.  INTERVAL HISTORY: Patient returns to clinic today for routine yearly evaluation and discussion of her imaging results.  She continues to have residual neurologic effects from a CVA several years ago, but otherwise feels well.  She continues to have chronic shortness of breath and weakness and fatigue.  She denies any recent fevers or illnesses.  She has a good appetite and denies weight loss.  She denies any chest pain, cough, or hemoptysis.  She denies any nausea, vomiting, constipation, or diarrhea.  She denies any melena or hematochezia.  She has no urinary complaints.  Patient offers no further specific complaints today.  REVIEW OF SYSTEMS:   Review of Systems  Constitutional:  Positive for malaise/fatigue. Negative for fever and weight loss.  Respiratory:  Positive for shortness of breath. Negative for cough and hemoptysis.   Cardiovascular: Negative.  Negative for chest pain and leg swelling.  Gastrointestinal: Negative.  Negative for abdominal pain and constipation.  Genitourinary: Negative.  Negative for dysuria.  Musculoskeletal: Negative.  Negative for back pain and myalgias.  Skin: Negative.  Negative for rash.  Neurological:  Positive for focal weakness and weakness. Negative for sensory change and headaches.  Psychiatric/Behavioral: Negative.  The patient is not nervous/anxious.     As per HPI. Otherwise, a  complete review of systems is negative.  PAST MEDICAL HISTORY: Past Medical History:  Diagnosis Date   Allergy    Anemia    Anxiety    Arthritis    Asthma    Blood transfusion without reported diagnosis    C. difficile diarrhea 04/07/2016   CAP (community acquired pneumonia) 03/21/2015   Combined forms of age-related cataract of both eyes 05/06/2016   COPD (chronic obstructive pulmonary disease) (HCC)    CVA (cerebral vascular accident) ([place]) 06/17/2018   Diabetes mellitus without complication (HCC)    Displacement of lumbar intervertebral disc without myelopathy    Emphysema of lung (HCC)    GERD (gastroesophageal reflux disease)    Glaucoma    History of chicken pox    Hyperlipidemia    Hypertension    Pneumonia 03/29/2015   Sepsis ([place]) 03/17/2016   Shortness of breath    Small cell lung cancer ([place])     PAST SURGICAL HISTORY: Past Surgical History:  Procedure Laterality Date   CESAREAN SECTION     TUBAL LIGATION     UPPER GI ENDOSCOPY      FAMILY HISTORY: Family History  Problem Relation Age of Onset   Heart failure Mother    Heart disease Mother    Stroke Mother    Heart disease Brother    COPD Brother    Cancer Maternal Aunt        Breast Cancer    ADVANCED DIRECTIVES (Y/N):  N  HEALTH MAINTENANCE: Social History   Tobacco Use   Smoking status: Former    Packs/day: 0.25    Years: 45.00    Total pack years: 11.25    Types: Cigarettes    Quit  date: 03/17/2016    Years since quitting: 5.3   Smokeless tobacco: Never   Tobacco comments:    pt states she quit in 2016-2017  Vaping Use   Vaping Use: Never used  Substance Use Topics   Alcohol use: No    Alcohol/week: 0.0 standard drinks of alcohol   Drug use: No     Colonoscopy:  PAP:  Bone density:  Lipid panel:  Allergies  Allergen Reactions   Codeine Anaphylaxis   Other Anaphylaxis   Penicillins Anaphylaxis, Rash and Other (See Comments)    Reaction:  Tongue swelling  Has patient had a PCN  reaction causing immediate rash, facial/tongue/throat swelling, SOB or lightheadedness with hypotension:  Yes   Has patient had a PCN reaction causing severe rash involving mucus membranes or skin necrosis: No Has patient had a PCN reaction that required hospitalization No Has patient had a PCN reaction occurring within the last 10 years: No If all of the above answers are "NO", then may proceed with Cephalosporin use.   Yellow Jacket Venom [Bee Venom] Anaphylaxis   Crestor [Rosuvastatin] Nausea And Vomiting    Corrected prior adverse reaction per [facility] [employer]. On 12/02/3011  patient reported N & V when she takes Crestor   Gemfibrozil Rash, Nausea And Vomiting and Swelling   Trazodone And Nefazodone Nausea And Vomiting   Lipitor [Atorvastatin] Nausea Only    By patient report 12/02/2011. Had also been prescribed pravastatin and lovastatin by previous MD. Unclear if those caused same side effects.     Current Outpatient Medications  Medication Sig Dispense Refill   albuterol (PROVENTIL HFA) 108 (90 Base) MCG/ACT inhaler Inhale 2 puffs into the lungs every 6 (six) hours as needed for wheezing or shortness of breath. 54 g 3   Alirocumab (PRALUENT) 150 MG/ML SOAJ Inject 1 mL into the skin every 14 (fourteen) days. 2 mL 12   aspirin 81 MG EC tablet Take 1 tablet (81 mg total) by mouth daily. Swallow whole.     benzonatate (TESSALON) 100 MG capsule Take 1 capsule by mouth three times daily as needed for cough (Patient not taking: Reported on 01/26/2021) 60 capsule 5   Blood Glucose Monitoring Suppl (ONE TOUCH ULTRA 2) w/Device KIT Use to check sugar daily for type 2 diabetes E11.9 1 kit 0   clonazePAM (KLONOPIN) 0.5 MG tablet Take 1 tablet (0.5 mg total) by mouth 2 (two) times daily as needed. 60 tablet 3   clopidogrel (PLAVIX) 75 MG tablet Take 1 tablet (75 mg total) by mouth daily. 90 tablet 4   diclofenac Sodium (VOLTAREN) 1 % GEL Apply 4 g topically 4 (four) times daily. 350 g 3   ezetimibe  (ZETIA) 10 MG tablet Take 1 tablet by mouth once daily 90 tablet 4   fenofibrate 160 MG tablet Take 1 tablet (160 mg total) by mouth daily. 90 tablet 0   gabapentin (NEURONTIN) 600 MG tablet Take one tablet up to 4 times a day 360 tablet 2   glucose blood (ONETOUCH ULTRA) test strip Use to check blood sugar daily for type 2 diabetes E11.9 100 strip 4   Iron, Ferrous Sulfate, 325 (65 Fe) MG TABS Take 325 mg by mouth daily. 100 tablet 3   Lancets (ONETOUCH ULTRASOFT) lancets Use to check sugar daily for type 2 diabetes E11.9 100 each 4   lisinopril (ZESTRIL) 10 MG tablet Take 10 mg by mouth daily.     meloxicam (MOBIC) 15 MG tablet TAKE 1 TABLET BY  MOUTH ONCE DAILY AS NEEDED FOR PAIN 90 tablet 0   metFORMIN (GLUCOPHAGE-XR) 500 MG 24 hr tablet Take 1 tablet (500 mg total) by mouth 2 (two) times daily with a meal. 180 tablet 4   metoprolol succinate (TOPROL-XL) 25 MG 24 hr tablet Take 1 tablet (25 mg total) by mouth daily. 90 tablet 3   montelukast (SINGULAIR) 10 MG tablet Take 1 tablet (10 mg total) by mouth at bedtime. 90 tablet 3   NARCAN 4 MG/0.1ML LIQD nasal spray kit Place 0.4 mg into the nose once.   99   ondansetron (ZOFRAN ODT) 4 MG disintegrating tablet Take 1 tablet (4 mg total) by mouth every 8 (eight) hours as needed for nausea or vomiting. (Patient not taking: Reported on 05/09/2021) 20 tablet 0   oxyCODONE-acetaminophen (PERCOCET) 10-325 MG tablet TAKE ONE TABLET BY MOUTH EVERY 4 HOURS AS NEEDED 180 tablet 0   pantoprazole (PROTONIX) 20 MG tablet Take 1 tablet (20 mg total) by mouth daily. 30 tablet 1   sertraline (ZOLOFT) 50 MG tablet Take 1 tablet (50 mg total) by mouth daily. 90 tablet 4   sucralfate (CARAFATE) 1 g tablet TAKE 1 TABLET BY MOUTH 4 TIMES DAILY (WITH MEALS AND AT BEDTIME) 360 tablet 4   Tiotropium Bromide-Olodaterol 2.5-2.5 MCG/ACT AERS Inhale 2 puffs into the lungs daily. 4 g 3   umeclidinium-vilanterol (ANORO ELLIPTA) 62.5-25 MCG/ACT AEPB Inhale 1 puff into the lungs  daily at 6 (six) AM. 3 each 4   vitamin B-12 (CYANOCOBALAMIN) 1000 MCG tablet Take 1 tablet (1,000 mcg total) by mouth daily. 30 tablet 0   zaleplon (SONATA) 10 MG capsule TAKE 1 CAPSULE BY MOUTH AT BEDTIME. MAY TAKE SECOND CAPSULE AFTER 2 HOURS IF NEEDED 30 capsule 5   No current facility-administered medications for this visit.    OBJECTIVE: There were no vitals filed for this visit.    There is no height or weight on file to calculate BMI.    ECOG FS:1 - Symptomatic but completely ambulatory  General: Well-developed, well-nourished, no acute distress.  Sitting in a wheelchair. Eyes: Pink conjunctiva, anicteric sclera. HEENT: Normocephalic, moist mucous membranes. Lungs: No audible wheezing or coughing. Heart: Regular rate and rhythm. Abdomen: Soft, nontender, no obvious distention. Musculoskeletal: No edema, cyanosis, or clubbing. Neuro: Alert, answering all questions appropriately. Cranial nerves grossly intact. Skin: No rashes or petechiae noted. Psych: Normal affect.   LAB RESULTS:  Lab Results  Component Value Date   NA 139 05/16/2021   K 3.6 05/16/2021   CL 103 05/16/2021   CO2 25 05/16/2021   GLUCOSE 157 (H) 05/16/2021   BUN 12 05/16/2021   CREATININE 0.63 05/16/2021   CALCIUM 9.1 05/16/2021   PROT 7.5 05/16/2021   ALBUMIN 4.3 05/16/2021   AST 26 05/16/2021   ALT 13 05/16/2021   ALKPHOS 70 05/16/2021   BILITOT 1.3 (H) 05/16/2021   GFRNONAA >60 05/16/2021   GFRAA 102 09/07/2019    Lab Results  Component Value Date   WBC 7.2 05/16/2021   NEUTROABS 6.4 05/09/2021   HGB 12.0 05/16/2021   HCT 36.4 05/16/2021   MCV 110.6 (H) 05/16/2021   PLT 317 05/16/2021     STUDIES: No results found.   ASSESSMENT: Clinical stage IIIB small cell lung cancer of the right lung.  PLAN:    1. Clinical stage IIIB small cell lung cancer of the right lung: Patient completed concurrent chemotherapy and XRT at outside facility and then transferred care to [facility] to complete  treatment and long-term monitoring for recurrence. She completed cycle 6 of carboplatinum and etoposide on August 15, 2016.  She also completed PCI.  Her most recent imaging with CT scan on February 2022 reviewed independently and report as above with no obvious evidence of recurrent or progressive disease.  Continue follow-up and imaging yearly until patient is 5 years removed from completing her treatment in July 2023.  Return to clinic in 1 year with repeat CT scan and further evaluation. 2.  Cough/shortness of breath: Chronic and unchanged.  Continue follow-up with pulmonology and cardiology as scheduled. 3.  Back pain: Chronic and unchanged.  Patient does not complain of this today.    Patient expressed understanding and was in agreement with this plan. She also understands that She can call clinic at any time with any questions, concerns, or complaints.    Cancer Staging  Small cell lung cancer, right [facility]) Staging form: Lung, AJCC 8th Edition - Clinical stage from 08/18/2016: Stage IIIB (cT2a, cN3, cM0) - Signed by Lloyd Huger, MD on 08/18/2016 Laterality: Right   Lloyd Huger, MD   07/10/2021 7:15 AM

## 2021-07-11 ENCOUNTER — Inpatient Hospital Stay: Payer: Medicare Other | Admitting: Oncology

## 2021-07-11 ENCOUNTER — Inpatient Hospital Stay: Payer: Medicare Other

## 2021-07-19 ENCOUNTER — Ambulatory Visit: Payer: Self-pay | Admitting: *Deleted

## 2021-07-19 ENCOUNTER — Telehealth: Payer: Self-pay | Admitting: Family Medicine

## 2021-07-19 DIAGNOSIS — G8929 Other chronic pain: Secondary | ICD-10-CM

## 2021-07-19 NOTE — Telephone Encounter (Signed)
  Chief Complaint: multiple falls right hand swelling requesting refill on oxycodone   Symptoms: last fall 3 weeks ago and is having multiple falls since stroke every other day. Right side weakness and pain. Right hand swelling. Requesting refill on oxycodone. Reports dizziness at times  Frequency: na  Pertinent Negatives: Patient denies injury to right side or head.  Disposition: [] ED /[] Urgent Care (no appt availability in office) / [x] Appointment(In office/virtual)/ []  [place] [facility]/ [] Home Care/ [] Refused Recommended Disposition /[] [place] [facility]/ []  Follow-up with PCP Additional Notes:   Patient needs to set up transportation through [facility]. Earliest available 08/24/21. Please contact patient regarding earlier appt .    Reason for Disposition  [1] Fall AND [2] went to emergency department for evaluation or treatment  Answer Assessment - Initial Assessment Questions 1. MECHANISM: "How did the fall happen?"     Shoe caught the floor and was seen 07/05/21 per notes 2. DOMESTIC VIOLENCE AND ELDER ABUSE SCREENING: "Did you fall because someone pushed you or tried to hurt you?" If Yes, ask: "Are you safe now?"     na 3. ONSET: "When did the fall happen?" (e.g., minutes, hours, or days ago)     3 days ago  4. LOCATION: "What part of the body hit the ground?" (e.g., back, buttocks, head, hips, knees, hands, head, stomach)     Right side  and right side of head  5. INJURY: "Did you hurt (injure) yourself when you fell?" If Yes, ask: "What did you injure? Tell me more about this?" (e.g., body area; type of injury; pain severity)"     No  6. PAIN: "Is there any pain?" If Yes, ask: "How bad is the pain?" (e.g., Scale 1-10; or mild,  moderate, severe)   - NONE (0): No pain   - MILD (1-3): Doesn't interfere with normal activities    - MODERATE (4-7): Interferes with normal activities or awakens from sleep    - SEVERE (8-10): Excruciating pain, unable to do any normal activities       Yes right side  7. SIZE: For cuts, bruises, or swelling, ask: "How large is it?" (e.g., inches or centimeters)      Denies injury 8. PREGNANCY: "Is there any chance you are pregnant?" "When was your last menstrual period?"     na 9. OTHER SYMPTOMS: "Do you have any other symptoms?" (e.g., dizziness, fever, weakness; new onset or worsening).      Right side weak from stroke, difficulty waking even with walker dizziness at times. Note reports right hand swelling  10. CAUSE: "What do you think caused the fall (or falling)?" (e.g., tripped, dizzy spell)       Not sure. Right side weak due to stroke and shoe caught on floor  Protocols used: Falls and [facility]

## 2021-07-19 NOTE — Telephone Encounter (Signed)
Medication Refill - Medication: oxyCODONE-acetaminophen (PERCOCET) 10-325 MG tablet  Has the patient contacted their pharmacy? Yes.    Preferred Pharmacy (with phone number or street name):  [facility] ([place]) - [place], [address] Phone:  [phone]  Fax:  [phone]     Has the patient been seen for an appointment in the last year OR does the patient have an upcoming appointment? Yes.

## 2021-07-19 NOTE — Telephone Encounter (Signed)
Requested medication (s) are due for refill today - no  Requested medication (s) are on the active medication list -yes  Future visit scheduled -yes  Last refill: 07/07/21 #180  Notes to clinic: non delegated Rx, Duplicate request  Requested Prescriptions  Pending Prescriptions Disp Refills   oxyCODONE-acetaminophen (PERCOCET) 10-325 MG tablet 180 tablet 0    Sig: TAKE ONE TABLET BY MOUTH EVERY 4 HOURS AS NEEDED     Not Delegated - Analgesics:  Opioid Agonist Combinations Failed - 07/19/2021 12:55 PM      Failed - This refill cannot be delegated      Failed - Urine Drug Screen completed in last 360 days      Passed - Valid encounter within last 3 months    Recent Outpatient Visits           2 months ago Shortness of breath   [employer], [place], PA-C   2 months ago Burning with urination   [facility] Birdie Sons, MD   5 months ago Type 2 diabetes mellitus with microalbuminuria, without long-term current use of insulin ([place])   [facility] Birdie Sons, MD   9 months ago Type 2 diabetes mellitus with microalbuminuria, without long-term current use of insulin ([place])   [facility] Birdie Sons, MD   1 year ago [place], Kirstie Peri, MD       Future Appointments             In 1 month Fisher, Kirstie Peri, MD [facility]               Requested Prescriptions  Pending Prescriptions Disp Refills   oxyCODONE-acetaminophen (PERCOCET) 10-325 MG tablet 180 tablet 0    Sig: TAKE ONE TABLET BY MOUTH EVERY 4 HOURS AS NEEDED     Not Delegated - Analgesics:  Opioid Agonist Combinations Failed - 07/19/2021 12:55 PM      Failed - This refill cannot be delegated      Failed - Urine Drug Screen completed in last 360 days      Passed - Valid encounter within last 3 months    Recent Outpatient Visits           2 months ago Shortness of breath    [facility], [place], PA-C   2 months ago Burning with urination   [facility] Birdie Sons, MD   5 months ago Type 2 diabetes mellitus with microalbuminuria, without long-term current use of insulin [facility])   [facility] Birdie Sons, MD   9 months ago Type 2 diabetes mellitus with microalbuminuria, without long-term current use of insulin [facility])   [facility] Birdie Sons, MD   1 year ago [place], Kirstie Peri, MD       Future Appointments             In 1 month Fisher, Kirstie Peri, MD [facility], [place]

## 2021-07-20 MED ORDER — OXYCODONE-ACETAMINOPHEN 10-325 MG PO TABS: ORAL_TABLET | ORAL | 0 refills | Status: DC

## 2021-07-20 NOTE — Telephone Encounter (Signed)
Pt called and now wants this Rx called into since she spoke to [facility] and they are not sending this without a phone in. So the patient wants this resent to the pharmacy listed below  [facility] [address]), [place] - [address]  [address] [place] ([place]) [place] [address]  Phone: [phone] Fax: [phone]

## 2021-07-25 ENCOUNTER — Ambulatory Visit: Payer: Self-pay

## 2021-07-25 ENCOUNTER — Other Ambulatory Visit: Payer: Self-pay | Admitting: Family Medicine

## 2021-07-25 DIAGNOSIS — F419 Anxiety disorder, unspecified: Secondary | ICD-10-CM

## 2021-07-25 DIAGNOSIS — G8929 Other chronic pain: Secondary | ICD-10-CM

## 2021-07-25 MED ORDER — OXYCODONE-ACETAMINOPHEN 10-325 MG PO TABS: 1.0000 | ORAL_TABLET | ORAL | 0 refills | Status: AC | PRN

## 2021-07-25 NOTE — Telephone Encounter (Signed)
Requested medication (s) are due for refill today: Yes  Requested medication (s) are on the active medication list: Yes  Last refill:  09/25/20  Future visit scheduled: Yes  Notes to clinic:  See request.    Requested Prescriptions  Pending Prescriptions Disp Refills   clonazePAM (KLONOPIN) 0.5 MG tablet [Pharmacy Med Name: clonazePAM 0.5 MG Oral Tablet] 60 tablet 0    Sig: Take 1 tablet by mouth twice daily as needed     Not Delegated - Psychiatry: Anxiolytics/Hypnotics 2 Failed - 07/25/2021  1:56 PM      Failed - This refill cannot be delegated      Failed - Urine Drug Screen completed in last 360 days      Passed - Patient is not pregnant      Passed - Valid encounter within last 6 months    Recent Outpatient Visits           2 months ago Shortness of breath   [employer], [place], PA-C   3 months ago Burning with urination   [facility] Birdie Sons, MD   6 months ago Type 2 diabetes mellitus with microalbuminuria, without long-term current use of insulin [facility])   [facility] [place] Birdie Sons, MD   10 months ago Type 2 diabetes mellitus with microalbuminuria, without long-term current use of insulin [facility])   [facility] Birdie Sons, MD   1 year ago [place], Kirstie Peri, MD       Future Appointments             In 1 month Fisher, Kirstie Peri, MD [facility], [place]

## 2021-07-25 NOTE — Telephone Encounter (Signed)
  Chief Complaint: needs refill of pain medication- please send to [place]. Pt has 1 pill left. Pt called about having severe back pain Symptoms: pt stated her pain starts at mid back to her lower back and shoots down her legs, pt stated she has fallen twice while using her walker, leg weakness, headache Frequency: became severe yesterday Pertinent Negatives: Patient denies incontinence of bowel or bladder Disposition: [] ED /[x] Urgent Care (no appt availability in office) / [] Appointment(In office/virtual)/ []  [place] [facility]/ [] Home Care/ [] Refused Recommended Disposition /[] [place] [facility]/ []  Follow-up with PCP Additional Notes: pt refused disposition- stating she will wait to see the doctor at her upcoming OV.  Mail order is out of stock of the oxycodone-acetaminophen and requesting refill called in or sent to [facility] on file. Pt stated that she is not getting the relief of pain like she was and mentioned Morphine.  Called [facility] and spoke with Arbie Cookey regarding pain med and pt's call. Routing high priority. Reason for Disposition  [1] SEVERE back pain (e.g., excruciating, unable to do any normal activities) AND [2] not improved 2 hours after pain medicine  Answer Assessment - Initial Assessment Questions 1. ONSET: "When did the pain begin?"      Years worsened yesterday 2. LOCATION: "Where does it hurt?" (upper, mid or lower back)     Mid to lower back and sciatica 3. SEVERITY: "How bad is the pain?"  (e.g., Scale 1-10; mild, moderate, or severe)   - MILD (1-3): doesn't interfere with normal activities    - MODERATE (4-7): interferes with normal activities or awakens from sleep    - SEVERE (8-10): excruciating pain, unable to do any normal activities      severe 4. PATTERN: "Is the pain constant?" (e.g., yes, no; constant, intermittent)      yes 5. RADIATION: "Does the pain shoot into your legs or elsewhere?"     yes 6. CAUSE:  "What do you think is causing the back pain?"       Broken back after a fall 7. BACK OVERUSE:  "Any recent lifting of heavy objects, strenuous work or exercise?"     no 8. MEDICATIONS: "What have you taken so far for the pain?" (e.g., nothing, acetaminophen, NSAIDS)     Tylenol- stated pain medication is not working to alleviating the pain asking for Morphine 9. NEUROLOGIC SYMPTOMS: "Do you have any weakness, numbness, or problems with bowel/bladder control?"     no 10. OTHER SYMPTOMS: "Do you have any other symptoms?" (e.g., fever, abdominal pain, burning with urination, blood in urine)       Headache (4/10) x 2 days  Protocols used: Back Pain-A-AH

## 2021-07-25 NOTE — Telephone Encounter (Signed)
Pt called about Rx request for Oxycodone / she stated it needed to go to [place] on [address] due to [place] not having it / please advise asap/ pt states she has 1 pill left and has went to the pharmacy already once to get it,   Please advise.

## 2021-07-25 NOTE — Telephone Encounter (Signed)
Pt called about Rx request for Oxycodone / she stated it needed to go to [place] on [address] due to [place] not having it / please advise asap/ pt states she has 1 pill left and has went to the pharmacy already once to get it

## 2021-08-01 ENCOUNTER — Telehealth: Payer: Self-pay

## 2021-08-01 NOTE — Telephone Encounter (Signed)
Copied from [place] [phone]. Topic: General - Other >> Aug 01, 2021  9:40 AM [place] B wrote: Reason for CRM: Erin nurse from [employer], called in asking if patient can be revaluated for statin. Please call back

## 2021-08-02 ENCOUNTER — Telehealth: Payer: Self-pay

## 2021-08-02 NOTE — Telephone Encounter (Signed)
I'm not sure who called. I don't see any open encounters or notes that document that someone tried calling patient this morning. I tried called patient back and left a detailed message on her voice message system informing her of this.

## 2021-08-02 NOTE — Telephone Encounter (Signed)
Copied from [place] [phone]. Topic: General - Other >> Aug 02, 2021 10:42 AM [place] B wrote: Reason for CRM: Patient returning call, she said she received this morning, not sure what it's about. Please call back.

## 2021-08-07 ENCOUNTER — Ambulatory Visit: Payer: Self-pay

## 2021-08-07 NOTE — Telephone Encounter (Signed)
  Chief Complaint: Burning with urination Symptoms: Painful urination  Frequency: 3 days Pertinent Negatives: Patient denies Fever, flank pain Disposition: [] ED /[] Urgent Care (no appt availability in office) / [] Appointment(In office/virtual)/ []  [facility]/ [] Home Care/ [x] Refused Recommended Disposition /[] [facility]/ []  Follow-up with PCP Additional Notes: Pt states she is unable to come to office or go to UC d/t disability and lack of transportation. Pt would like an antibiotic called into pharmacy. Pt mentioned cipro.  Pt would like rx sent to :  [facility] - [place] - [address] Phone:  [phone]  Fax:  [phone]        Summary: burning when urinating   Patient states she is experiencing burning when urinating x 2d   Patient states she has been taking azo but it is not helping, patient states she does not have transportation to an appt and requesting a rx   Please fu w./ patient      Reason for Disposition  Age > 50 years  Answer Assessment - Initial Assessment Questions 1. SEVERITY: "How bad is the pain?"  (e.g., Scale 1-10; mild, moderate, or severe)   - MILD (1-3): complains slightly about urination hurting   - MODERATE (4-7): interferes with normal activities     - SEVERE (8-10): excruciating, unwilling or unable to urinate because of the pain      12/10 2. FREQUENCY: "How many times have you had painful urination today?"      2X 3. PATTERN: "Is pain present every time you urinate or just sometimes?"      Every time 4. ONSET: "When did the painful urination start?"      3 days ago 5. FEVER: "Do you have a fever?" If Yes, ask: "What is your temperature, how was it measured, and when did it start?"     no 6. PAST UTI: "Have you had a urine infection before?" If Yes, ask: "When was the last time?" and "What happened that time?"      20 years 7. CAUSE: "What do you think is causing the painful urination?"  (e.g., UTI,  scratch, Herpes sore)     UTI 8. OTHER SYMPTOMS: "Do you have any other symptoms?" (e.g., flank pain, vaginal discharge, genital sores, urgency, blood in urine)     Just pain 9. PREGNANCY: "Is there any chance you are pregnant?" "When was your last menstrual period?"     no  Protocols used: Urination Pain - Female-A-AH

## 2021-08-20 ENCOUNTER — Other Ambulatory Visit: Payer: Self-pay

## 2021-08-24 ENCOUNTER — Ambulatory Visit: Payer: Medicare Other | Admitting: Family Medicine

## 2021-08-28 ENCOUNTER — Telehealth: Payer: Self-pay | Admitting: Family Medicine

## 2021-08-28 DIAGNOSIS — M5126 Other intervertebral disc displacement, lumbar region: Secondary | ICD-10-CM

## 2021-08-28 NOTE — Telephone Encounter (Signed)
Medication Refill - Medication: gabapentin (NEURONTIN) 600 MG tablet  Has the patient contacted their pharmacy? Yes.   Pharmacy called to request refill (90 day supply)  Preferred Pharmacy (with phone number or street name):  [facility] - [place], [address] Phone:  [phone]  Fax:  [phone]     Has the patient been seen for an appointment in the last year OR does the patient have an upcoming appointment? Yes.    Agent: Please be advised that RX refills may take up to 3 business days. We ask that you follow-up with your pharmacy.

## 2021-08-28 NOTE — Telephone Encounter (Signed)
[facility] called and spoke to J-Me, Customer Care Associate about the refill(s) Gabapentin requested. Advised it was sent on 06/10/21 #360/2 refill(s). She says it was processed July 17th and 18th, which was not approved by insurance due to being too early. She says it was run through again on July 28th and it's showing processing, so that means the patient will be receiving it.

## 2021-09-03 ENCOUNTER — Telehealth: Payer: Self-pay | Admitting: *Deleted

## 2021-09-03 DIAGNOSIS — Z5982 Transportation insecurity: Secondary | ICD-10-CM

## 2021-09-03 DIAGNOSIS — Z658 Other specified problems related to psychosocial circumstances: Secondary | ICD-10-CM

## 2021-09-03 DIAGNOSIS — C3491 Malignant neoplasm of unspecified part of right bronchus or lung: Secondary | ICD-10-CM

## 2021-09-03 NOTE — Telephone Encounter (Signed)
Disregard last message. Not sure what my brain was thinking.

## 2021-09-03 NOTE — Telephone Encounter (Signed)
Patient requests a call back. Stating "I believe my cancer has come back."  Requesting a return phone call.  Reviewed chart. Patient has missed multiple apts/no shows in the cancer center.  I will call patient back to further discuss her concerns/symptoms

## 2021-09-03 NOTE — Telephone Encounter (Signed)
I have rescheduled CT scan and MD appt. I have left pt a detailed VM with appt details.

## 2021-09-03 NOTE — Telephone Encounter (Signed)
Patient returned my phone call. She was not able to get Abbie's phone call earlier. Reviewed time of apts for scans/ rescheduling apts to see Dr. Grayland Ormond.   Pt states that she has transportation issues and this is why she has not kept any apts in the past. She feels like she has fluid on her right lung again. She stated that she had the lungs tapped previously when her cancer occurred.  She has missed several appointments due to lack of transportation. She is requesting her CT scan to be r/s and apt with Dr. Grayland Ormond.  Patient reports decrease appetite. She reports fatigue and shortness of breath.   She would like her apts with Dr. Grayland Ormond to be mailed to her. Ledell Noss- will you mail out the apt to patient.   I will enter a Education officer, museum referral to [place] given pt's social barriers. Pt wants to discuss getting a home health aide in her home at apt with Dr. Grayland Ormond. She has no family support.

## 2021-09-05 ENCOUNTER — Inpatient Hospital Stay: Payer: Medicare Other | Attending: Oncology | Admitting: Licensed Clinical Social Worker

## 2021-09-05 DIAGNOSIS — C3491 Malignant neoplasm of unspecified part of right bronchus or lung: Secondary | ICD-10-CM

## 2021-09-05 DIAGNOSIS — Z85118 Personal history of other malignant neoplasm of bronchus and lung: Secondary | ICD-10-CM | POA: Insufficient documentation

## 2021-09-05 DIAGNOSIS — Z9221 Personal history of antineoplastic chemotherapy: Secondary | ICD-10-CM | POA: Insufficient documentation

## 2021-09-05 DIAGNOSIS — Z87891 Personal history of nicotine dependence: Secondary | ICD-10-CM | POA: Insufficient documentation

## 2021-09-05 DIAGNOSIS — Z923 Personal history of irradiation: Secondary | ICD-10-CM | POA: Insufficient documentation

## 2021-09-05 NOTE — Progress Notes (Signed)
[place] [facility]  Initial Assessment   Yvette Riley is a 66 y.o. year old female contacted by phone. Clinical Social Work was referred by medical provider for assessment of psychosocial needs.   SDOH (Social Determinants of Health) assessments performed: Yes SDOH Interventions    Flowsheet Row Most Recent Value  SDOH Interventions   Food Insecurity Interventions [id number] Referral, Other (Comment)  [Patietn's food insecurity is based on the inability of the patient to get groceries, due to transportation issues.]  Financial Strain Interventions Intervention Not Indicated  Housing Interventions Intervention Not Indicated  Physical Activity Interventions Intervention Not Indicated  Stress Interventions Provide Counseling  Transportation Interventions CCAR Lucianne Lei ([place]. Only), [id number] Referral  Depression Interventions/Treatment  Counseling       SDOH Screenings   Alcohol Screen: Low Risk  (09/05/2021)   Alcohol Screen    Last Alcohol Screening Score (AUDIT): 0  Depression (PHQ2-9): High Risk (09/05/2021)   Depression (PHQ2-9)    PHQ-2 Score: 15  Financial Resource Strain: Low Risk  (09/05/2021)   Overall Financial Resource Strain (CARDIA)    Difficulty of Paying Living Expenses: Not hard at all  Food Insecurity: No Food Insecurity (09/05/2021)   Hunger Vital Sign    Worried About Running Out of Food in the Last Year: Never true    Ran Out of Food in the Last Year: Never true  Housing: Low Risk  (09/05/2021)   Housing    Last Housing Risk Score: 0  Physical Activity: Inactive (09/05/2021)   Exercise Vital Sign    Days of Exercise per Week: 0 days    Minutes of Exercise per Session: 0 min  Social Connections: Socially Isolated (12/13/2020)   Social Connection and Isolation Panel [NHANES]    Frequency of Communication with Friends and Family: More than three times a week    Frequency of Social Gatherings with Friends and Family: Three times a week    Attends  Religious Services: Never    Active Member of Clubs or Organizations: No    Attends Archivist Meetings: Never    Marital Status: Widowed  Stress: Stress Concern Present (09/05/2021)   [place]    Feeling of Stress : Very much  Tobacco Use: Medium Risk (05/16/2021)   Patient History    Smoking Tobacco Use: Former    Smokeless Tobacco Use: Never    Passive Exposure: Not on file  Transportation Needs: Unmet Transportation Needs (09/05/2021)   PRAPARE - Hydrologist (Medical): Yes    Lack of Transportation (Non-Medical): Yes     Distress Screen completed: No    10/01/2016   10:05 AM  ONCBCN DISTRESS SCREENING  Screening Type Patient Declined  Distress experienced in past week (1-10) 7  Family Problem type Children      Family/Social Information:  Housing Arrangement: patient lives alone, patient's brother Yvette Riley (Brother)  [phone], is the main contact but patient indicated her brother is not available to assist her and is not a source of support. Family members/support persons in your life? Patient states she does not have a source of support either family or friends. Transportation concerns: yes, patient does not have any means of transportation.  [facility] will assist with transportation.  Employment: Disabled.  Income source: Network engineer concerns: No Type of concern: None Food access concerns: yes, patient is unable to find transportation to take her to get groceries  regularly.  Religious or spiritual practice: Not known Services Currently in place:  [facility] Medicare  Coping/ Adjustment to diagnosis: Patient understands treatment plan and what happens next? no, patient believes she is experiencing symptoms similar to when she was last diagnosed with lung cancer. Concerns about diagnosis and/or treatment: Pain or discomfort during procedures,  How will I care for myself, and Quality of life Patient reported stressors: Transportation, Food, Anxiety/ nervousness, Isolation/ feeling alone, and lack of support system Hopes and/or priorities: to get someone to assist her with getting groceries regularly Patient enjoys  N/A Current coping skills/ strengths: Average or above average intelligence , Financial means , General fund of knowledge , and Motivation for treatment/growth     SUMMARY: Current SDOH Barriers:  Limited social support, Transportation, Limited access to food, ADL IADL limitations, Inability to perform ADL's independently, and Inability to perform IADL's independently - Patient stated he is unable to perform all many ADLs/IADLs on her own due to her   Clinical Social Work Clinical Goal(s):  Demonstrate a reduction in symptoms related to :anxiety and adjustment  Explore community resource options for unmet needs related to:  Transportation, [employer] , Depression  , Social Connections, Stress, and Physical Activity Patient will work with SW to address concerns related to transportation, food insecurity Patient will work with CSW to address needs related to Medicaid application, on 02/24/5168 at 12:00PM  Interventions: Discussed common feeling and emotions when being diagnosed with cancer, and the importance of support during treatment Informed patient of the support team roles and support services at [facility] Provided CSW contact information and encouraged patient to call with any questions or concerns Provided patient with information about CSW role in patient care and available resources.   Follow Up Plan: CSW will see patient on 09/06/2021 at 12:00 PM Patient verbalizes understanding of plan: Yes  CSW spoke with patient who stated she is concerned she has relapsed and her lung cancer has returned.  Patient stated she does not have any support system, and although she does have contact with her brother, he is not able  to assist patient with care needs. Patient has increased difficulty with all ADLs/IADLs due to her health concerns, including recent back surgery and strokes. Patient mentioned she is unable to go to doctor's appointments or regularly get groceries because she doesn't have consistent transportation. Transportation has been set up for patient to get CT scan. CSW will assist patient with DSS paperwork, before her CAT scan.     Adelene Amas, LCSW

## 2021-09-06 ENCOUNTER — Inpatient Hospital Stay: Payer: Medicare Other

## 2021-09-06 ENCOUNTER — Ambulatory Visit
Admission: RE | Admit: 2021-09-06 | Discharge: 2021-09-06 | Disposition: A | Discharge status: Home / Self Care | Payer: Medicare Other | Source: Ambulatory Visit | Attending: Oncology | Admitting: Oncology

## 2021-09-06 ENCOUNTER — Inpatient Hospital Stay: Payer: Medicare Other | Admitting: Licensed Clinical Social Worker

## 2021-09-06 DIAGNOSIS — J479 Bronchiectasis, uncomplicated: Secondary | ICD-10-CM | POA: Diagnosis not present

## 2021-09-06 DIAGNOSIS — C3491 Malignant neoplasm of unspecified part of right bronchus or lung: Secondary | ICD-10-CM | POA: Insufficient documentation

## 2021-09-06 DIAGNOSIS — I3139 Other pericardial effusion (noninflammatory): Secondary | ICD-10-CM | POA: Diagnosis not present

## 2021-09-06 DIAGNOSIS — C3490 Malignant neoplasm of unspecified part of unspecified bronchus or lung: Secondary | ICD-10-CM | POA: Diagnosis not present

## 2021-09-06 LAB — POCT I-STAT CREATININE: Creatinine, Ser: 0.6 mg/dL (ref 0.44–1.00)

## 2021-09-06 MED ORDER — IOHEXOL 300 MG/ML  SOLN
75.0000 mL | Freq: Once | INTRAMUSCULAR | Status: AC | PRN
Start: 2021-09-06 — End: 2021-09-06
  Administered 2021-09-06: 75 mL via INTRAVENOUS

## 2021-09-06 NOTE — Progress Notes (Signed)
[facility] CSW Progress Note  Holiday representative met with patient to discuss Food Stamp benefits paperwork from [place]. Patient presented paperwork to [place].  CSW explained to patient what paperwork [place] what requesting and stated I would contact  records department to find out if they would be able to provide records of bills she has received from Aug 2022 - July 2023, per the request of [place]. Paperwork is due by 09/10/2021, CSW let patient know about the due date and let her know we could not guarantee we would be able to have the billing records to her by that date. Patient verbalized understanding.  CSW sent request for medical billing records to records manager.  Adelene Amas, LCSW

## 2021-09-08 NOTE — Progress Notes (Unsigned)
[facility]  [phone] [phone]  ID: Yvette Riley OB: 26-Apr-1955  MR#: [id number]  QIO#:[id number]  Patient Care Team: Birdie Sons, MD as PCP - General (Family Medicine) Lorelee Cover., MD (Ophthalmology) Vladimir Crofts, MD as Consulting Physician (Neurology) Dionisio David, MD as Consulting Physician (Cardiology) Lloyd Huger, MD as Consulting Physician (Oncology) Noreene Filbert, MD as Referring Physician (Radiation Oncology)  CHIEF COMPLAINT: Clinical stage IIIB small cell lung cancer of the right lung.  INTERVAL HISTORY: Patient returns to clinic today for routine yearly evaluation and discussion of her imaging results.  She continues to have residual neurologic effects from a CVA several years ago, but otherwise feels well.  She continues to have chronic shortness of breath and weakness and fatigue.  She denies any recent fevers or illnesses.  She has a good appetite and denies weight loss.  She denies any chest pain, cough, or hemoptysis.  She denies any nausea, vomiting, constipation, or diarrhea.  She denies any melena or hematochezia.  She has no urinary complaints.  Patient offers no further specific complaints today.  REVIEW OF SYSTEMS:   Review of Systems  Constitutional:  Positive for malaise/fatigue. Negative for fever and weight loss.  Respiratory:  Positive for shortness of breath. Negative for cough and hemoptysis.   Cardiovascular: Negative.  Negative for chest pain and leg swelling.  Gastrointestinal: Negative.  Negative for abdominal pain and constipation.  Genitourinary: Negative.  Negative for dysuria.  Musculoskeletal: Negative.  Negative for back pain and myalgias.  Skin: Negative.  Negative for rash.  Neurological:  Positive for focal weakness and weakness. Negative for sensory change and headaches.  Psychiatric/Behavioral: Negative.  The patient is not nervous/anxious.     As per HPI. Otherwise, a  complete review of systems is negative.  PAST MEDICAL HISTORY: Past Medical History:  Diagnosis Date   Allergy    Anemia    Anxiety    Arthritis    Asthma    Blood transfusion without reported diagnosis    C. difficile diarrhea 04/07/2016   CAP (community acquired pneumonia) 03/21/2015   Combined forms of age-related cataract of both eyes 05/06/2016   COPD (chronic obstructive pulmonary disease) (HCC)    CVA (cerebral vascular accident) ([place]) 06/17/2018   Diabetes mellitus without complication (HCC)    Displacement of lumbar intervertebral disc without myelopathy    Emphysema of lung (HCC)    GERD (gastroesophageal reflux disease)    Glaucoma    History of chicken pox    Hyperlipidemia    Hypertension    Pneumonia 03/29/2015   Sepsis ([place]) 03/17/2016   Shortness of breath    Small cell lung cancer ([place])     PAST SURGICAL HISTORY: Past Surgical History:  Procedure Laterality Date   CESAREAN SECTION     TUBAL LIGATION     UPPER GI ENDOSCOPY      FAMILY HISTORY: Family History  Problem Relation Age of Onset   Heart failure Mother    Heart disease Mother    Stroke Mother    Heart disease Brother    COPD Brother    Cancer Maternal Aunt        Breast Cancer    ADVANCED DIRECTIVES (Y/N):  N  HEALTH MAINTENANCE: Social History   Tobacco Use   Smoking status: Former    Packs/day: 0.25    Years: 45.00    Total pack years: 11.25    Types: Cigarettes    Quit  date: 03/17/2016    Years since quitting: 5.4   Smokeless tobacco: Never   Tobacco comments:    pt states she quit in 2016-2017  Vaping Use   Vaping Use: Never used  Substance Use Topics   Alcohol use: No    Alcohol/week: 0.0 standard drinks of alcohol   Drug use: No     Colonoscopy:  PAP:  Bone density:  Lipid panel:  Allergies  Allergen Reactions   Codeine Anaphylaxis   Other Anaphylaxis   Penicillins Anaphylaxis, Rash and Other (See Comments)    Reaction:  Tongue swelling  Has patient had a PCN  reaction causing immediate rash, facial/tongue/throat swelling, SOB or lightheadedness with hypotension:  Yes   Has patient had a PCN reaction causing severe rash involving mucus membranes or skin necrosis: No Has patient had a PCN reaction that required hospitalization No Has patient had a PCN reaction occurring within the last 10 years: No If all of the above answers are "NO", then may proceed with Cephalosporin use.   Yellow Jacket Venom [Bee Venom] Anaphylaxis   Crestor [Rosuvastatin] Nausea And Vomiting    Corrected prior adverse reaction per [facility] [employer]. On 12/02/3011  patient reported N & V when she takes Crestor   Gemfibrozil Rash, Nausea And Vomiting and Swelling   Trazodone And Nefazodone Nausea And Vomiting   Lipitor [Atorvastatin] Nausea Only    By patient report 12/02/2011. Had also been prescribed pravastatin and lovastatin by previous MD. Unclear if those caused same side effects.     Current Outpatient Medications  Medication Sig Dispense Refill   albuterol (PROVENTIL HFA) 108 (90 Base) MCG/ACT inhaler Inhale 2 puffs into the lungs every 6 (six) hours as needed for wheezing or shortness of breath. 54 g 3   Alirocumab (PRALUENT) 150 MG/ML SOAJ Inject 1 mL into the skin every 14 (fourteen) days. 2 mL 12   aspirin 81 MG EC tablet Take 1 tablet (81 mg total) by mouth daily. Swallow whole.     benzonatate (TESSALON) 100 MG capsule Take 1 capsule by mouth three times daily as needed for cough (Patient not taking: Reported on 01/26/2021) 60 capsule 5   Blood Glucose Monitoring Suppl (ONE TOUCH ULTRA 2) w/Device KIT Use to check sugar daily for type 2 diabetes E11.9 1 kit 0   clonazePAM (KLONOPIN) 0.5 MG tablet Take 1 tablet (0.5 mg total) by mouth 2 (two) times daily as needed. 30 tablet 0   clopidogrel (PLAVIX) 75 MG tablet Take 1 tablet (75 mg total) by mouth daily. 90 tablet 4   diclofenac Sodium (VOLTAREN) 1 % GEL Apply 4 g topically 4 (four) times daily. 350 g 3   ezetimibe  (ZETIA) 10 MG tablet Take 1 tablet by mouth once daily 90 tablet 4   fenofibrate 160 MG tablet Take 1 tablet (160 mg total) by mouth daily. 90 tablet 0   gabapentin (NEURONTIN) 600 MG tablet Take one tablet up to 4 times a day 360 tablet 2   glucose blood (ONETOUCH ULTRA) test strip Use to check blood sugar daily for type 2 diabetes E11.9 100 strip 4   Iron, Ferrous Sulfate, 325 (65 Fe) MG TABS Take 325 mg by mouth daily. 100 tablet 3   Lancets (ONETOUCH ULTRASOFT) lancets Use to check sugar daily for type 2 diabetes E11.9 100 each 4   lisinopril (ZESTRIL) 10 MG tablet Take 10 mg by mouth daily.     meloxicam (MOBIC) 15 MG tablet TAKE 1 TABLET BY  MOUTH ONCE DAILY AS NEEDED FOR PAIN 90 tablet 0   metFORMIN (GLUCOPHAGE-XR) 500 MG 24 hr tablet Take 1 tablet (500 mg total) by mouth 2 (two) times daily with a meal. 180 tablet 4   metoprolol succinate (TOPROL-XL) 25 MG 24 hr tablet Take 1 tablet (25 mg total) by mouth daily. 90 tablet 3   montelukast (SINGULAIR) 10 MG tablet Take 1 tablet (10 mg total) by mouth at bedtime. 90 tablet 3   NARCAN 4 MG/0.1ML LIQD nasal spray kit Place 0.4 mg into the nose once.   99   ondansetron (ZOFRAN ODT) 4 MG disintegrating tablet Take 1 tablet (4 mg total) by mouth every 8 (eight) hours as needed for nausea or vomiting. (Patient not taking: Reported on 05/09/2021) 20 tablet 0   pantoprazole (PROTONIX) 20 MG tablet Take 1 tablet (20 mg total) by mouth daily. 30 tablet 1   sertraline (ZOLOFT) 50 MG tablet Take 1 tablet (50 mg total) by mouth daily. 90 tablet 4   sucralfate (CARAFATE) 1 g tablet TAKE 1 TABLET BY MOUTH 4 TIMES DAILY (WITH MEALS AND AT BEDTIME) 360 tablet 4   Tiotropium Bromide-Olodaterol 2.5-2.5 MCG/ACT AERS Inhale 2 puffs into the lungs daily. 4 g 3   umeclidinium-vilanterol (ANORO ELLIPTA) 62.5-25 MCG/ACT AEPB Inhale 1 puff into the lungs daily at 6 (six) AM. 3 each 4   vitamin B-12 (CYANOCOBALAMIN) 1000 MCG tablet Take 1 tablet (1,000 mcg total) by mouth  daily. 30 tablet 0   zaleplon (SONATA) 10 MG capsule TAKE 1 CAPSULE BY MOUTH AT BEDTIME. MAY TAKE SECOND CAPSULE AFTER 2 HOURS IF NEEDED 30 capsule 5   No current facility-administered medications for this visit.    OBJECTIVE: There were no vitals filed for this visit.    There is no height or weight on file to calculate BMI.    ECOG FS:1 - Symptomatic but completely ambulatory  General: Well-developed, well-nourished, no acute distress.  Sitting in a wheelchair. Eyes: Pink conjunctiva, anicteric sclera. HEENT: Normocephalic, moist mucous membranes. Lungs: No audible wheezing or coughing. Heart: Regular rate and rhythm. Abdomen: Soft, nontender, no obvious distention. Musculoskeletal: No edema, cyanosis, or clubbing. Neuro: Alert, answering all questions appropriately. Cranial nerves grossly intact. Skin: No rashes or petechiae noted. Psych: Normal affect.   LAB RESULTS:  Lab Results  Component Value Date   NA 139 05/16/2021   K 3.6 05/16/2021   CL 103 05/16/2021   CO2 25 05/16/2021   GLUCOSE 157 (H) 05/16/2021   BUN 12 05/16/2021   CREATININE 0.60 09/06/2021   CALCIUM 9.1 05/16/2021   PROT 7.5 05/16/2021   ALBUMIN 4.3 05/16/2021   AST 26 05/16/2021   ALT 13 05/16/2021   ALKPHOS 70 05/16/2021   BILITOT 1.3 (H) 05/16/2021   GFRNONAA >60 05/16/2021   GFRAA 102 09/07/2019    Lab Results  Component Value Date   WBC 7.2 05/16/2021   NEUTROABS 6.4 05/09/2021   HGB 12.0 05/16/2021   HCT 36.4 05/16/2021   MCV 110.6 (H) 05/16/2021   PLT 317 05/16/2021     STUDIES: CT CHEST W CONTRAST  Result Date: 09/07/2021 CLINICAL DATA:  Small cell lung cancer treated with chemotherapy and radiation therapy. * Tracking Code: BO * EXAM: CT CHEST WITH CONTRAST TECHNIQUE: Multidetector CT imaging of the chest was performed during intravenous contrast administration. RADIATION DOSE REDUCTION: This exam was performed according to the departmental dose-optimization program which includes  automated exposure control, adjustment of the mA and/or kV according to patient  size and/or use of iterative reconstruction technique. CONTRAST:  40mL OMNIPAQUE IOHEXOL 300 MG/ML  SOLN COMPARISON:  05/16/2021. FINDINGS: Cardiovascular: Coronary artery calcification. Enlarged pulmonic trunk. Heart size normal. Small pericardial effusion, similar. Mediastinum/Nodes: No pathologically enlarged mediastinal lymph nodes. Soft tissue thickening in the right hilum, as before. No left hilar or axillary adenopathy. Mid esophageal wall thickening may be related to radiation therapy. Distal periesophageal lymph node measures 6 mm, decreased from 10 mm. Lungs/Pleura: Post radiation parenchymal consolidation, bronchiectasis and architectural distortion the medial right hemithorax. Similar scattered patchy peribronchovascular nodularity, indicative of scarring. A few scattered left-sided pulmonary nodules measure up to 4 mm in the left lower lobe (3/85), possibly decreased from 4 mm. No pleural fluid. Narrowing of the lobar bronchi on the right secondary to radiation therapy. Airway is otherwise unremarkable. Upper Abdomen: Liver is slightly decreased in attenuation diffusely. Adrenal glands are unremarkable. Visualized portions of the left kidney, spleen, pancreas stomach and bowel are grossly unremarkable. No upper abdominal adenopathy. Musculoskeletal: Degenerative changes in the spine. Old right rib fractures. No worrisome lytic or sclerotic lesions. Old L1 compression fracture. IMPRESSION: 1. Interval decrease in size of a distal periesophageal lymph node. 2. Similar to minimally decreased size of tiny left pulmonary nodules. 3. Similar small pericardial effusion. 4. Mid esophageal wall thickening, likely radiation related. 5. Liver appears steatotic. 6. Coronary artery calcification. 7. Enlarged pulmonic trunk, indicative pulmonary arterial hypertension. Electronically Signed   By: Lorin Picket M.D.   On: 09/07/2021 11:50      ASSESSMENT: Clinical stage IIIB small cell lung cancer of the right lung.  PLAN:    1. Clinical stage IIIB small cell lung cancer of the right lung: Patient completed concurrent chemotherapy and XRT at outside facility and then transferred care to [facility] to complete treatment and long-term monitoring for recurrence. She completed cycle 6 of carboplatinum and etoposide on August 15, 2016.  She also completed PCI.  Her most recent imaging with CT scan on February 2022 reviewed independently and report as above with no obvious evidence of recurrent or progressive disease.  Continue follow-up and imaging yearly until patient is 5 years removed from completing her treatment in July 2023.  Return to clinic in 1 year with repeat CT scan and further evaluation. 2.  Cough/shortness of breath: Chronic and unchanged.  Continue follow-up with pulmonology and cardiology as scheduled. 3.  Back pain: Chronic and unchanged.  Patient does not complain of this today.    Patient expressed understanding and was in agreement with this plan. She also understands that She can call clinic at any time with any questions, concerns, or complaints.    Cancer Staging  Small cell lung cancer, right [facility]) Staging form: Lung, AJCC 8th Edition - Clinical stage from 08/18/2016: Stage IIIB (cT2a, cN3, cM0) - Signed by Lloyd Huger, MD on 08/18/2016 Laterality: Right   Lloyd Huger, MD   09/08/2021 2:03 PM

## 2021-09-12 ENCOUNTER — Inpatient Hospital Stay (HOSPITAL_BASED_OUTPATIENT_CLINIC_OR_DEPARTMENT_OTHER): Payer: Medicare Other | Admitting: Oncology

## 2021-09-12 ENCOUNTER — Encounter: Payer: Self-pay | Admitting: Oncology

## 2021-09-12 ENCOUNTER — Inpatient Hospital Stay: Payer: Medicare Other

## 2021-09-12 VITALS — BP 121/91 | HR 120 | Temp 98.6°F | Resp 16 | Wt 157.2 lb

## 2021-09-12 DIAGNOSIS — C3491 Malignant neoplasm of unspecified part of right bronchus or lung: Secondary | ICD-10-CM

## 2021-09-12 DIAGNOSIS — Z87891 Personal history of nicotine dependence: Secondary | ICD-10-CM | POA: Diagnosis not present

## 2021-09-12 DIAGNOSIS — Z923 Personal history of irradiation: Secondary | ICD-10-CM | POA: Diagnosis not present

## 2021-09-12 DIAGNOSIS — Z9221 Personal history of antineoplastic chemotherapy: Secondary | ICD-10-CM | POA: Diagnosis not present

## 2021-09-12 DIAGNOSIS — Z85118 Personal history of other malignant neoplasm of bronchus and lung: Secondary | ICD-10-CM | POA: Diagnosis not present

## 2021-09-12 NOTE — Progress Notes (Unsigned)
Pt will like to know if she can have thoracentesis.

## 2021-09-14 ENCOUNTER — Telehealth: Payer: Self-pay

## 2021-09-14 NOTE — Telephone Encounter (Signed)
Copied from [place] [id number]. Topic: General - Other >> Sep 14, 2021  8:43 AM Everette C wrote: Reason for CRM: Reason for CRM: Jamie with [employer] would like to speak with a member of clinical staff about statin therapies for the patient   Please contact further when possible

## 2021-09-14 NOTE — Telephone Encounter (Signed)
Returned call to [facility]. Agent was asking questions about cholesterol medications the is on now. I advised the agent that we needed consent from patient to disclose health information. Agent verbalized understanding and agreed to fax over consent to release information to her.

## 2021-09-25 ENCOUNTER — Encounter: Payer: Self-pay | Admitting: Family Medicine

## 2021-09-25 ENCOUNTER — Ambulatory Visit (INDEPENDENT_AMBULATORY_CARE_PROVIDER_SITE_OTHER): Payer: Medicare Other | Admitting: Family Medicine

## 2021-09-25 VITALS — BP 112/80 | HR 142 | Temp 98.4°F | Resp 20 | Wt 163.0 lb

## 2021-09-25 DIAGNOSIS — R270 Ataxia, unspecified: Secondary | ICD-10-CM | POA: Diagnosis not present

## 2021-09-25 DIAGNOSIS — E781 Pure hyperglyceridemia: Secondary | ICD-10-CM | POA: Diagnosis not present

## 2021-09-25 DIAGNOSIS — R296 Repeated falls: Secondary | ICD-10-CM

## 2021-09-25 DIAGNOSIS — I6930 Unspecified sequelae of cerebral infarction: Secondary | ICD-10-CM

## 2021-09-25 DIAGNOSIS — M5441 Lumbago with sciatica, right side: Secondary | ICD-10-CM | POA: Diagnosis not present

## 2021-09-25 DIAGNOSIS — I2510 Atherosclerotic heart disease of native coronary artery without angina pectoris: Secondary | ICD-10-CM | POA: Diagnosis not present

## 2021-09-25 DIAGNOSIS — Z8673 Personal history of transient ischemic attack (TIA), and cerebral infarction without residual deficits: Secondary | ICD-10-CM

## 2021-09-25 DIAGNOSIS — E1129 Type 2 diabetes mellitus with other diabetic kidney complication: Secondary | ICD-10-CM

## 2021-09-25 DIAGNOSIS — E538 Deficiency of other specified B group vitamins: Secondary | ICD-10-CM | POA: Diagnosis not present

## 2021-09-25 DIAGNOSIS — M5442 Lumbago with sciatica, left side: Secondary | ICD-10-CM | POA: Diagnosis not present

## 2021-09-25 DIAGNOSIS — G8929 Other chronic pain: Secondary | ICD-10-CM | POA: Diagnosis not present

## 2021-09-25 DIAGNOSIS — R809 Proteinuria, unspecified: Secondary | ICD-10-CM | POA: Diagnosis not present

## 2021-09-25 MED ORDER — OXYCODONE-ACETAMINOPHEN 10-325 MG PO TABS: ORAL_TABLET | ORAL | 0 refills | Status: DC

## 2021-09-25 NOTE — Progress Notes (Signed)
I,Sulibeya S Dimas,acting as a scribe for Mila Merry, MD.,have documented all relevant documentation on the behalf of Mila Merry, MD,as directed by  Mila Merry, MD while in the presence of Mila Merry, MD.   Established patient visit   Patient: Yvette Riley   DOB: 09/23/1955   66 y.o. Female  MRN: [id number] Visit Date: 09/25/2021  Today's healthcare provider: Mila Merry, MD   Chief Complaint  Patient presents with   Fall   Subjective    HPI  Patient here today C/O multiple falls. She reports h/o stroke affecting right side. Patient reports leg pain causes her to fall also. Patient reports last fall this morning at home. She reports falling and hitting her head. She is requesting an order for wheelchair.    Medications: Outpatient Medications Prior to Visit  Medication Sig   albuterol (PROVENTIL HFA) 108 (90 Base) MCG/ACT inhaler Inhale 2 puffs into the lungs every 6 (six) hours as needed for wheezing or shortness of breath.   aspirin 81 MG EC tablet Take 1 tablet (81 mg total) by mouth daily. Swallow whole.   clonazePAM (KLONOPIN) 0.5 MG tablet Take 1 tablet (0.5 mg total) by mouth 2 (two) times daily as needed.   clopidogrel (PLAVIX) 75 MG tablet Take 1 tablet (75 mg total) by mouth daily.   diclofenac Sodium (VOLTAREN) 1 % GEL Apply 4 g topically 4 (four) times daily.   ezetimibe (ZETIA) 10 MG tablet Take 1 tablet by mouth once daily   fenofibrate 160 MG tablet Take 1 tablet (160 mg total) by mouth daily.   gabapentin (NEURONTIN) 600 MG tablet Take one tablet up to 4 times a day   glucose blood (ONETOUCH ULTRA) test strip Use to check blood sugar daily for type 2 diabetes E11.9   Lancets (ONETOUCH ULTRASOFT) lancets Use to check sugar daily for type 2 diabetes E11.9   lisinopril (ZESTRIL) 10 MG tablet Take 10 mg by mouth daily.   metFORMIN (GLUCOPHAGE-XR) 500 MG 24 hr tablet Take 1 tablet (500 mg total) by mouth 2 (two) times daily with a meal.    metoprolol succinate (TOPROL-XL) 25 MG 24 hr tablet Take 1 tablet (25 mg total) by mouth daily.   montelukast (SINGULAIR) 10 MG tablet Take 1 tablet (10 mg total) by mouth at bedtime.   NARCAN 4 MG/0.1ML LIQD nasal spray kit Place 0.4 mg into the nose once.    sucralfate (CARAFATE) 1 g tablet TAKE 1 TABLET BY MOUTH 4 TIMES DAILY (WITH MEALS AND AT BEDTIME) (Patient taking differently: as needed. TAKE 1 TABLET BY MOUTH 4 TIMES DAILY (WITH MEALS AND AT BEDTIME))   umeclidinium-vilanterol (ANORO ELLIPTA) 62.5-25 MCG/ACT AEPB Inhale 1 puff into the lungs daily at 6 (six) AM.   zaleplon (SONATA) 10 MG capsule TAKE 1 CAPSULE BY MOUTH AT BEDTIME. MAY TAKE SECOND CAPSULE AFTER 2 HOURS IF NEEDED   Alirocumab (PRALUENT) 150 MG/ML SOAJ Inject 1 mL into the skin every 14 (fourteen) days.   benzonatate (TESSALON) 100 MG capsule Take 1 capsule by mouth three times daily as needed for cough   Blood Glucose Monitoring Suppl (ONE TOUCH ULTRA 2) w/Device KIT Use to check sugar daily for type 2 diabetes E11.9 (Patient not taking: Reported on 09/25/2021)   Iron, Ferrous Sulfate, 325 (65 Fe) MG TABS Take 325 mg by mouth daily. (Patient not taking: Reported on 09/25/2021)   meloxicam (MOBIC) 15 MG tablet TAKE 1 TABLET BY MOUTH ONCE DAILY AS NEEDED FOR PAIN (  Patient not taking: Reported on 09/25/2021)   metoprolol tartrate (LOPRESSOR) 25 MG tablet Take 12.5 mg by mouth 2 (two) times daily.   ondansetron (ZOFRAN ODT) 4 MG disintegrating tablet Take 1 tablet (4 mg total) by mouth every 8 (eight) hours as needed for nausea or vomiting.   pantoprazole (PROTONIX) 20 MG tablet Take 1 tablet (20 mg total) by mouth daily. (Patient not taking: Reported on 09/25/2021)   sertraline (ZOLOFT) 50 MG tablet Take 1 tablet (50 mg total) by mouth daily. (Patient not taking: Reported on 09/25/2021)   Tiotropium Bromide-Olodaterol 2.5-2.5 MCG/ACT AERS Inhale 2 puffs into the lungs daily.   vitamin B-12 (CYANOCOBALAMIN) 1000 MCG tablet Take 1  tablet (1,000 mcg total) by mouth daily.   No facility-administered medications prior to visit.    Review of Systems  Constitutional:  Positive for activity change, appetite change and fatigue.  Respiratory:  Positive for shortness of breath and wheezing.   Cardiovascular:  Negative for chest pain.  Musculoskeletal:  Positive for back pain, gait problem and myalgias.       Objective    BP 112/80 (BP Location: Left Arm, Patient Position: Sitting, Cuff Size: Large)   Pulse (!) 142   Temp 98.4 F (36.9 C) (Oral)   Resp 20   Wt 163 lb (73.9 kg)   SpO2 93%   BMI 34.07 kg/m  BP Readings from Last 3 Encounters:  09/25/21 112/80  09/12/21 (!) 121/91  05/16/21 134/72   Wt Readings from Last 3 Encounters:  09/25/21 163 lb (73.9 kg)  09/12/21 157 lb 3.2 oz (71.3 kg)  05/16/21 173 lb 1.6 oz (78.5 kg)      Physical Exam   General: Appearance:    Mildly obese female in no acute distress  Eyes:    PERRL, conjunctiva/corneas clear, EOM's intact       Lungs:     Clear to auscultation bilaterally, respirations unlabored  Heart:    Tachycardic. Normal rhythm. No murmurs, rubs, or gallops.    MS:   All extremities are intact.    Neurologic:   Awake, alert, oriented x 3. No apparent focal neurological defect.         Assessment & Plan   1. Chronic bilateral low back pain with bilateral sciatica refill - oxyCODONE-acetaminophen (PERCOCET) 10-325 MG tablet; TAKE ONE TABLET BY MOUTH EVERY 4 HOURS AS NEEDED  Dispense: 180 tablet; Refill: 0  2. Atherosclerosis of coronary artery of native heart without angina pectoris, unspecified vessel or lesion type Asymptomatic. Compliant with medication.  Continue aggressive risk factor modification.    3. Type 2 diabetes mellitus with microalbuminuria, without long-term current use of insulin (HCC)  - Hemoglobin A1c  4. Hyperglyceridemia, pure   5. History of cerebellar CVA with residual deficit  - CBC - Comprehensive metabolic panel -  Lipid panel  6. B12 deficiency  - Vitamin B12  7. History of multiple cerebrovascular accidents (CVAs)  - LONG TERM MONITOR (3-14 DAYS); Future  8. Falls frequently  - Ambulatory referral to Home Health  9. Ataxia  - Ambulatory referral to [place]         The entirety of the information documented in the History of Present Illness, Review of Systems and Physical Exam were personally obtained by me. Portions of this information were initially documented by the CMA and reviewed by me for thoroughness and accuracy.     Lelon Huh, MD  [facility] [phone] (phone) [phone] (fax)  [facility]

## 2021-09-26 ENCOUNTER — Ambulatory Visit: Payer: Medicare Other | Attending: Family Medicine

## 2021-09-26 DIAGNOSIS — Z8673 Personal history of transient ischemic attack (TIA), and cerebral infarction without residual deficits: Secondary | ICD-10-CM

## 2021-10-08 ENCOUNTER — Other Ambulatory Visit: Payer: Self-pay | Admitting: Family Medicine

## 2021-10-08 DIAGNOSIS — M5126 Other intervertebral disc displacement, lumbar region: Secondary | ICD-10-CM

## 2021-10-08 DIAGNOSIS — F419 Anxiety disorder, unspecified: Secondary | ICD-10-CM

## 2021-10-08 NOTE — Telephone Encounter (Signed)
I called [facility] pharmacy and was informed that they did receive Oxycodone -Acetaminophen prescription that was sent in on 09/25/2021. I was also advised that the prescription is in the last stage to be filled and it should be shipped out to the patient soon. I tried calling patient to give an update. Phone number listed in patient's chart [phone] is not available; "call cannot be completed as dialed".   Gabapentin:  Last refill:06/10/2021 #360 with 2 refills  Clonazepam:  Last refill: 07/25/2021 #30 with 0 refills   Last office visit: 09/25/2021 No future office visit scheduled

## 2021-10-08 NOTE — Telephone Encounter (Signed)
Pt states pharmacy told her to call Dr Fisher:Needs new script and also a while since clonazepam .  Medication Refill - Medication:  oxyCODONE-acetaminophen (PERCOCET) 10-325 MG tablet 180 tablet 0 09/25/2021    Sig: TAKE ONE TABLET BY MOUTH EVERY 4 HOURS AS NEEDED   Sent to pharmacy as: oxyCODONE-acetaminophen (PERCOCET) 10-325 MG tablet   Earliest Fill Date: 09/25/2021   Notes to Pharmacy: Please send a new electronic prescription or call in a verbal order for controlled substance (class CIII-CV). Please note C-II medications require an e-script. Thanks!   gabapentin (NEURONTIN) 600 MG tablet clonazePAM (KLONOPIN) 0.5 MG tablet (Agent: If no, request that the patient contact the pharmacy for the refill. If patient does not wish to contact the pharmacy document the reason why and proceed with request.) (Agent: If yes, when and what did the pharmacy advise?) Call your doctor  Preferred Pharmacy (with phone number or street name):  [facility] [place], [address] Phone:  [phone]  Fax:  [phone]     Has the patient been seen for an appointment in the last year OR does the patient have an upcoming appointment? Yes.    Agent: Please be advised that RX refills may take up to 3 business days. We ask that you follow-up with your pharmacy.

## 2021-10-09 MED ORDER — CLONAZEPAM 0.5 MG PO TABS: 0.5000 mg | ORAL_TABLET | Freq: Two times a day (BID) | ORAL | 0 refills | Status: DC | PRN

## 2021-10-09 MED ORDER — GABAPENTIN 600 MG PO TABS: ORAL_TABLET | ORAL | 4 refills | Status: DC

## 2021-10-23 ENCOUNTER — Other Ambulatory Visit: Payer: Self-pay | Admitting: Family Medicine

## 2021-10-23 DIAGNOSIS — G8929 Other chronic pain: Secondary | ICD-10-CM

## 2021-10-23 NOTE — Telephone Encounter (Signed)
Requested medication (s) are due for refill today:   Provider to review  Requested medication (s) are on the active medication list:   Yes  Future visit scheduled:   No  Seen 4 wks ago   Last ordered: 09/25/2021 #180, 0 refills  Non delegated refill   Requested Prescriptions  Pending Prescriptions Disp Refills   oxyCODONE-acetaminophen (PERCOCET) 10-325 MG tablet [Pharmacy Med Name: oxycodone-acetaminophen 10 mg-325 mg tablet] 180 tablet 0    Sig: TAKE ONE TABLET BY MOUTH EVERY 4 HOURS AS NEEDED (VIAL)     Not Delegated - Analgesics:  Opioid Agonist Combinations Failed - 10/23/2021 11:51 AM      Failed - This refill cannot be delegated      Failed - Urine Drug Screen completed in last 360 days      Passed - Valid encounter within last 3 months    Recent Outpatient Visits           4 weeks ago Atherosclerosis of coronary artery of native heart without angina pectoris, unspecified vessel or lesion type   [facility] Birdie Sons, MD   5 months ago Shortness of breath   [facility] [place], [place], PA-C   6 months ago Burning with urination   [facility] Birdie Sons, MD   9 months ago Type 2 diabetes mellitus with microalbuminuria, without long-term current use of insulin [facility])   [facility] Birdie Sons, MD   1 year ago Type 2 diabetes mellitus with microalbuminuria, without long-term current use of insulin [facility])   [facility] Birdie Sons, MD

## 2021-11-14 ENCOUNTER — Ambulatory Visit: Payer: Self-pay | Admitting: *Deleted

## 2021-11-14 NOTE — Telephone Encounter (Signed)
  Chief Complaint: Tingling Right Side Symptoms: "Tingling entire right side of body." States comes and goes, "Different spots at different times. Not all at the same time."  Frequency: 3 weeks ago Pertinent Negatives: Patient denies numbness, pain Disposition: [] ED /[] Urgent Care (no appt availability in office) / [x] Appointment(In office/virtual)/ []  [place] [facility]/ [] Home Care/ [] Refused Recommended Disposition /[]  Mobile Bus/ []  Follow-up with PCP Additional Notes: Pt evasive historian. Reports lost vision in left eye "Six months ago" States loss of balance "For years." Clarified new symptoms that prompted call was tingling. Denies numbness,no increased weakness. Pt stated any appt would need to be 2 weeks pout due to transportation issues. Appt secured for Nov. 3rd with Dr. Caryn Section. Advised to EMS for any worsening symptoms. Pt verbalizes understanding.   Pt also states she needs Oxygen like they have on TV. Pt has home O2.. Asked multiple times what she was referring to, kept stating "Like they have on TV." Reason for Disposition  [1] Weakness of arm / hand, or leg / foot AND [2] is a chronic symptom (recurrent or ongoing AND present > 4 weeks)    "Tingling"  Answer Assessment - Initial Assessment Questions 1. SYMPTOM: "What is the main symptom you are concerned about?" (e.g., weakness, numbness)     Tingling right side 2. ONSET: "When did this start?" (minutes, hours, days; while sleeping)     3 weeks ago 3. LAST NORMAL: "When was the last time you (the patient) were normal (no symptoms)?"     3 weeks ago 4. PATTERN "Does this come and go, or has it been constant since it started?"  "Is it present now?"     Various places on right side tingle at times 5. CARDIAC SYMPTOMS: "Have you had any of the following symptoms: chest pain, difficulty breathing, palpitations?"      6. NEUROLOGIC SYMPTOMS: "Have you had any of the following symptoms: headache, dizziness, vision  loss, double vision, changes in speech, unsteady on your feet?"     6 months ago lost vision left eye 7. OTHER SYMPTOMS: "Do you have any other symptoms?"     Weakness right hand for 3 months  Protocols used: Neurologic Deficit-A-AH

## 2021-11-28 ENCOUNTER — Other Ambulatory Visit: Payer: Self-pay | Admitting: Family Medicine

## 2021-11-28 DIAGNOSIS — F5101 Primary insomnia: Secondary | ICD-10-CM

## 2021-11-28 DIAGNOSIS — G8929 Other chronic pain: Secondary | ICD-10-CM

## 2021-11-28 NOTE — Telephone Encounter (Signed)
Requested medication (s) are due for refill today: yes both  Requested medication (s) are on the active medication list: yes both meds    Last refill: Oxy: 10/26/21  #180  0 refills   Zaleplon 06/10/21  #30  5 refills  Future visit scheduled yes 11/30/21  Notes to clinic:Not delegated, please review. Thank you.  Requested Prescriptions  Pending Prescriptions Disp Refills   oxyCODONE-acetaminophen (PERCOCET) 10-325 MG tablet [Pharmacy Med Name: oxycodone-acetaminophen 10 mg-325 mg tablet] 180 tablet 0    Sig: TAKE ONE TABLET BY MOUTH EVERY 4 HOURS AS NEEDED (VIAL)     Not Delegated - Analgesics:  Opioid Agonist Combinations Failed - 11/28/2021  3:11 PM      Failed - This refill cannot be delegated      Failed - Urine Drug Screen completed in last 360 days      Passed - Valid encounter within last 3 months    Recent Outpatient Visits           2 months ago Atherosclerosis of coronary artery of native heart without angina pectoris, unspecified vessel or lesion type   [facility] Birdie Sons, MD   6 months ago Shortness of breath   [facility] [place], [place], PA-C   7 months ago Burning with urination   [facility] Birdie Sons, MD   10 months ago Type 2 diabetes mellitus with microalbuminuria, without long-term current use of insulin [facility])   [facility] Birdie Sons, MD   1 year ago Type 2 diabetes mellitus with microalbuminuria, without long-term current use of insulin [facility])   [facility] Birdie Sons, MD       Future Appointments             In 2 days Fisher, Kirstie Peri, MD [facility]             zaleplon (SONATA) 10 MG capsule [Pharmacy Med Name: zaleplon 10 mg capsule] 30 capsule 5    Sig: TAKE ONE CAPSULE BY MOUTH DAILY AT 9PM AT BEDTIME MAY TAKE SECOND CAPSULE AFTER 2 HOURS IF NEEDED (VIAL)     Not Delegated - Psychiatry:  Anxiolytics/Hypnotics Failed -  11/28/2021  3:11 PM      Failed - This refill cannot be delegated      Failed - Urine Drug Screen completed in last 360 days      Passed - Valid encounter within last 6 months    Recent Outpatient Visits           2 months ago Atherosclerosis of coronary artery of native heart without angina pectoris, unspecified vessel or lesion type   [facility] Birdie Sons, MD   6 months ago Shortness of breath   [facility] [place], [place], PA-C   7 months ago Burning with urination   [facility] Birdie Sons, MD   10 months ago Type 2 diabetes mellitus with microalbuminuria, without long-term current use of insulin [facility])   [facility] Birdie Sons, MD   1 year ago Type 2 diabetes mellitus with microalbuminuria, without long-term current use of insulin [facility])   [facility] Birdie Sons, MD       Future Appointments             In 2 days Fisher, Kirstie Peri, MD [facility]

## 2021-11-30 ENCOUNTER — Ambulatory Visit: Payer: Medicare Other | Admitting: Family Medicine

## 2021-11-30 NOTE — Progress Notes (Deleted)
Established patient visit   Patient: Yvette Riley   DOB: September 11, 1955   66 y.o. Female  MRN: [id number] Visit Date: 11/30/2021  Today's healthcare provider: Lelon Huh, MD   No chief complaint on file.  Subjective    HPI  ***  Medications: Outpatient Medications Prior to Visit  Medication Sig  . albuterol (PROVENTIL HFA) 108 (90 Base) MCG/ACT inhaler Inhale 2 puffs into the lungs every 6 (six) hours as needed for wheezing or shortness of breath.  . Alirocumab (PRALUENT) 150 MG/ML SOAJ Inject 1 mL into the skin every 14 (fourteen) days.  [name] aspirin 81 MG EC tablet Take 1 tablet (81 mg total) by mouth daily. Swallow whole.  . benzonatate (TESSALON) 100 MG capsule Take 1 capsule by mouth three times daily as needed for cough  . Blood Glucose Monitoring Suppl (ONE TOUCH ULTRA 2) w/Device KIT Use to check sugar daily for type 2 diabetes E11.9 (Patient not taking: Reported on 09/25/2021)  . clonazePAM (KLONOPIN) 0.5 MG tablet Take 1 tablet (0.5 mg total) by mouth 2 (two) times daily as needed.  . clopidogrel (PLAVIX) 75 MG tablet Take 1 tablet (75 mg total) by mouth daily.  . diclofenac Sodium (VOLTAREN) 1 % GEL Apply 4 g topically 4 (four) times daily.  [name] ezetimibe (ZETIA) 10 MG tablet Take 1 tablet by mouth once daily  . fenofibrate 160 MG tablet Take 1 tablet (160 mg total) by mouth daily.  [name] gabapentin (NEURONTIN) 600 MG tablet Take one tablet up to 4 times a day  . glucose blood (ONETOUCH ULTRA) test strip Use to check blood sugar daily for type 2 diabetes E11.9  . Iron, Ferrous Sulfate, 325 (65 Fe) MG TABS Take 325 mg by mouth daily. (Patient not taking: Reported on 09/25/2021)  . Lancets (ONETOUCH ULTRASOFT) lancets Use to check sugar daily for type 2 diabetes E11.9  . lisinopril (ZESTRIL) 10 MG tablet Take 10 mg by mouth daily.  . meloxicam (MOBIC) 15 MG tablet TAKE 1 TABLET BY MOUTH ONCE DAILY AS NEEDED FOR PAIN (Patient not taking: Reported on 09/25/2021)  . metFORMIN  (GLUCOPHAGE-XR) 500 MG 24 hr tablet Take 1 tablet (500 mg total) by mouth 2 (two) times daily with a meal.  . metoprolol succinate (TOPROL-XL) 25 MG 24 hr tablet Take 1 tablet (25 mg total) by mouth daily.  . metoprolol tartrate (LOPRESSOR) 25 MG tablet Take 12.5 mg by mouth 2 (two) times daily.  . montelukast (SINGULAIR) 10 MG tablet Take 1 tablet (10 mg total) by mouth at bedtime.  [name] NARCAN 4 MG/0.1ML LIQD nasal spray kit Place 0.4 mg into the nose once.   . ondansetron (ZOFRAN ODT) 4 MG disintegrating tablet Take 1 tablet (4 mg total) by mouth every 8 (eight) hours as needed for nausea or vomiting.  [name] oxyCODONE-acetaminophen (PERCOCET) 10-325 MG tablet TAKE ONE TABLET BY MOUTH EVERY 4 HOURS AS NEEDED (VIAL)  . pantoprazole (PROTONIX) 20 MG tablet Take 1 tablet (20 mg total) by mouth daily. (Patient not taking: Reported on 09/25/2021)  . sertraline (ZOLOFT) 50 MG tablet Take 1 tablet (50 mg total) by mouth daily. (Patient not taking: Reported on 09/25/2021)  . sucralfate (CARAFATE) 1 g tablet TAKE 1 TABLET BY MOUTH 4 TIMES DAILY (WITH MEALS AND AT BEDTIME) (Patient taking differently: as needed. TAKE 1 TABLET BY MOUTH 4 TIMES DAILY (WITH MEALS AND AT BEDTIME))  . Tiotropium Bromide-Olodaterol 2.5-2.5 MCG/ACT AERS Inhale 2 puffs into the lungs daily.  [name]  umeclidinium-vilanterol (ANORO ELLIPTA) 62.5-25 MCG/ACT AEPB Inhale 1 puff into the lungs daily at 6 (six) AM.  . vitamin B-12 (CYANOCOBALAMIN) 1000 MCG tablet Take 1 tablet (1,000 mcg total) by mouth daily.  . zaleplon (SONATA) 10 MG capsule TAKE ONE CAPSULE BY MOUTH DAILY AT 9PM AT BEDTIME MAY TAKE SECOND CAPSULE AFTER 2 HOURS IF NEEDED (VIAL)   No facility-administered medications prior to visit.    Review of Systems  Constitutional:  Negative for appetite change, chills, fatigue and fever.  Respiratory:  Negative for chest tightness and shortness of breath.   Cardiovascular:  Negative for chest pain and palpitations.  Gastrointestinal:   Negative for abdominal pain, nausea and vomiting.  Neurological:  Negative for dizziness and weakness.   {Labs  Heme  Chem  Endocrine  Serology  Results Review (optional):23779}   Objective    There were no vitals taken for this visit. {Show previous vital signs (optional):23777}  Physical Exam  ***  No results found for any visits on 11/30/21.  Assessment & Plan     ***  No follow-ups on file.      {provider attestation***:1}   Lelon Huh, MD  [facility] [phone] (phone) [phone] (fax)  [place]

## 2021-12-14 ENCOUNTER — Telehealth: Payer: Self-pay

## 2021-12-14 NOTE — Telephone Encounter (Signed)
Copied from [place] [id number]. Topic: General - Inquiry >> Dec 14, 2021 12:14 PM Erskine Squibb wrote: .Reason for CRM: The patient called in stating [facility] and was transferred to a nursing facility rehab in [place]. The place is called [facility] and she says it is too expensive, over $296 a day without insurance. She has [facility] Medicare and wants to know if there is somewhere closer and cheaper to go to. She is being discharged on Monday 11/20. Please assist patient further

## 2021-12-14 NOTE — Telephone Encounter (Signed)
She'll need to talk to a Education officer, museum. They should have a Education officer, museum at her current facility.

## 2021-12-17 NOTE — Telephone Encounter (Signed)
Patient advised as below.  

## 2021-12-24 ENCOUNTER — Ambulatory Visit: Payer: Self-pay | Admitting: *Deleted

## 2021-12-24 NOTE — Telephone Encounter (Signed)
Summary: Medication question   Patient states that she is currently at a nursing rehab facility and they are not giving her oxyCODONE-acetaminophen (PERCOCET) 10-325 MG tablet they are only giving her Tylenol.         Chief Complaint: requesting to continue percocet while in Rehab facility. Symptoms: back and leg pain  Frequency: na Pertinent Negatives: Patient denies na Disposition: [] ED /[] Urgent Care (no appt availability in office) / [] Appointment(In office/virtual)/ []  [place] [facility]/ [] Home Care/ [] Refused Recommended Disposition /[] [place] [facility]/ [x]  Follow-up with PCP Additional Notes:   Patient currently at [facility] rehab facility and was taken off of percocet and is only given tylenol for pain. Patient will be transferred to [facility] tomorrow and patient is requesting if PCP can continue to order Percocet for pain . Tylenol is not effective for pain. Please advise.       Reason for Disposition  [1] Caller has NON-URGENT medicine question about med that PCP prescribed AND [2] triager unable to answer question  Answer Assessment - Initial Assessment Questions 1. NAME of MEDICINE: "What medicine(s) are you calling about?"     percocet 2. QUESTION: "What is your question?" (e.g., double dose of medicine, side effect)     Patient would like to continue percocet while in Rehab facility. Can Dr. Caryn Section continue medication? 3. PRESCRIBER: "Who prescribed the medicine?" Reason: if prescribed by specialist, call should be referred to that group.     PCP 4. SYMPTOMS: "Do you have any symptoms?" If Yes, ask: "What symptoms are you having?"  "How bad are the symptoms (e.g., mild, moderate, severe)     Back and leg pain 5. PREGNANCY:  "Is there any chance that you are pregnant?" "When was your last menstrual period?"     na  Protocols used: Medication Question Call-A-AH

## 2021-12-24 NOTE — Telephone Encounter (Signed)
Please advise message below  °

## 2021-12-24 NOTE — Telephone Encounter (Signed)
I don't have any control what they give her at the rehab facility, they have providers that that order medications. We can send medical records of meds she has been taking. It looks like she has been at rehab since 11/10, why is this just now an issue?

## 2021-12-28 NOTE — Progress Notes (Deleted)
Established patient visit   Patient: Yvette Riley   DOB: January 19, 1956   66 y.o. Female  MRN: [id number] Visit Date: 12/31/2021  Today's healthcare provider: Lelon Huh, MD   No chief complaint on file.  Subjective    HPI  Follow up for OT and PT.  Medications: Outpatient Medications Prior to Visit  Medication Sig   albuterol (PROVENTIL HFA) 108 (90 Base) MCG/ACT inhaler Inhale 2 puffs into the lungs every 6 (six) hours as needed for wheezing or shortness of breath.   Alirocumab (PRALUENT) 150 MG/ML SOAJ Inject 1 mL into the skin every 14 (fourteen) days.   aspirin 81 MG EC tablet Take 1 tablet (81 mg total) by mouth daily. Swallow whole.   benzonatate (TESSALON) 100 MG capsule Take 1 capsule by mouth three times daily as needed for cough   Blood Glucose Monitoring Suppl (ONE TOUCH ULTRA 2) w/Device KIT Use to check sugar daily for type 2 diabetes E11.9 (Patient not taking: Reported on 09/25/2021)   clonazePAM (KLONOPIN) 0.5 MG tablet Take 1 tablet (0.5 mg total) by mouth 2 (two) times daily as needed.   clopidogrel (PLAVIX) 75 MG tablet Take 1 tablet (75 mg total) by mouth daily.   diclofenac Sodium (VOLTAREN) 1 % GEL Apply 4 g topically 4 (four) times daily.   ezetimibe (ZETIA) 10 MG tablet Take 1 tablet by mouth once daily   fenofibrate 160 MG tablet Take 1 tablet (160 mg total) by mouth daily.   gabapentin (NEURONTIN) 600 MG tablet Take one tablet up to 4 times a day   glucose blood (ONETOUCH ULTRA) test strip Use to check blood sugar daily for type 2 diabetes E11.9   Iron, Ferrous Sulfate, 325 (65 Fe) MG TABS Take 325 mg by mouth daily. (Patient not taking: Reported on 09/25/2021)   Lancets (ONETOUCH ULTRASOFT) lancets Use to check sugar daily for type 2 diabetes E11.9   lisinopril (ZESTRIL) 10 MG tablet Take 10 mg by mouth daily.   meloxicam (MOBIC) 15 MG tablet TAKE 1 TABLET BY MOUTH ONCE DAILY AS NEEDED FOR PAIN (Patient not taking: Reported on 09/25/2021)    metFORMIN (GLUCOPHAGE-XR) 500 MG 24 hr tablet Take 1 tablet (500 mg total) by mouth 2 (two) times daily with a meal.   metoprolol succinate (TOPROL-XL) 25 MG 24 hr tablet Take 1 tablet (25 mg total) by mouth daily.   metoprolol tartrate (LOPRESSOR) 25 MG tablet Take 12.5 mg by mouth 2 (two) times daily.   montelukast (SINGULAIR) 10 MG tablet Take 1 tablet (10 mg total) by mouth at bedtime.   NARCAN 4 MG/0.1ML LIQD nasal spray kit Place 0.4 mg into the nose once.    ondansetron (ZOFRAN ODT) 4 MG disintegrating tablet Take 1 tablet (4 mg total) by mouth every 8 (eight) hours as needed for nausea or vomiting.   oxyCODONE-acetaminophen (PERCOCET) 10-325 MG tablet TAKE ONE TABLET BY MOUTH EVERY 4 HOURS AS NEEDED (VIAL)   pantoprazole (PROTONIX) 20 MG tablet Take 1 tablet (20 mg total) by mouth daily. (Patient not taking: Reported on 09/25/2021)   sertraline (ZOLOFT) 50 MG tablet Take 1 tablet (50 mg total) by mouth daily. (Patient not taking: Reported on 09/25/2021)   sucralfate (CARAFATE) 1 g tablet TAKE 1 TABLET BY MOUTH 4 TIMES DAILY (WITH MEALS AND AT BEDTIME) (Patient taking differently: as needed. TAKE 1 TABLET BY MOUTH 4 TIMES DAILY (WITH MEALS AND AT BEDTIME))   Tiotropium Bromide-Olodaterol 2.5-2.5 MCG/ACT AERS Inhale 2 puffs into  the lungs daily.   umeclidinium-vilanterol (ANORO ELLIPTA) 62.5-25 MCG/ACT AEPB Inhale 1 puff into the lungs daily at 6 (six) AM.   vitamin B-12 (CYANOCOBALAMIN) 1000 MCG tablet Take 1 tablet (1,000 mcg total) by mouth daily.   zaleplon (SONATA) 10 MG capsule TAKE ONE CAPSULE BY MOUTH DAILY AT 9PM AT BEDTIME MAY TAKE SECOND CAPSULE AFTER 2 HOURS IF NEEDED (VIAL)   No facility-administered medications prior to visit.    Review of Systems  Constitutional:  Negative for appetite change, chills, fatigue and fever.  Respiratory:  Negative for chest tightness and shortness of breath.   Cardiovascular:  Negative for chest pain and palpitations.  Gastrointestinal:  Negative  for abdominal pain, nausea and vomiting.  Neurological:  Negative for dizziness and weakness.    {Labs  Heme  Chem  Endocrine  Serology  Results Review (optional):23779}   Objective    There were no vitals taken for this visit. {Show previous vital signs (optional):23777}  Physical Exam  ***  No results found for any visits on 12/31/21.  Assessment & Plan     ***  No follow-ups on file.      {provider attestation***:1}   Lelon Huh, MD  [facility] [phone] (phone) [phone] (fax)  [place]

## 2021-12-31 ENCOUNTER — Ambulatory Visit: Payer: Medicare Other | Admitting: Family Medicine

## 2022-01-31 DIAGNOSIS — R1310 Dysphagia, unspecified: Secondary | ICD-10-CM | POA: Diagnosis not present

## 2022-01-31 DIAGNOSIS — I10 Essential (primary) hypertension: Secondary | ICD-10-CM | POA: Diagnosis not present

## 2022-01-31 DIAGNOSIS — K219 Gastro-esophageal reflux disease without esophagitis: Secondary | ICD-10-CM | POA: Diagnosis not present

## 2022-01-31 DIAGNOSIS — G629 Polyneuropathy, unspecified: Secondary | ICD-10-CM | POA: Diagnosis not present

## 2022-01-31 DIAGNOSIS — J449 Chronic obstructive pulmonary disease, unspecified: Secondary | ICD-10-CM | POA: Diagnosis not present

## 2022-01-31 DIAGNOSIS — G8929 Other chronic pain: Secondary | ICD-10-CM | POA: Diagnosis not present

## 2022-01-31 DIAGNOSIS — E119 Type 2 diabetes mellitus without complications: Secondary | ICD-10-CM | POA: Diagnosis not present

## 2022-01-31 DIAGNOSIS — K5900 Constipation, unspecified: Secondary | ICD-10-CM | POA: Diagnosis not present

## 2022-02-07 ENCOUNTER — Emergency Department: Payer: Medicare HMO

## 2022-02-07 ENCOUNTER — Other Ambulatory Visit: Payer: Self-pay

## 2022-02-07 ENCOUNTER — Inpatient Hospital Stay
Admission: EM | Admit: 2022-02-07 | Discharge: 2022-02-09 | DRG: 392 | Disposition: A | Discharge status: Skilled Nursing Facility | Payer: Medicare HMO | Source: Skilled Nursing Facility | Attending: Osteopathic Medicine | Admitting: Osteopathic Medicine

## 2022-02-07 DIAGNOSIS — Z9221 Personal history of antineoplastic chemotherapy: Secondary | ICD-10-CM | POA: Diagnosis not present

## 2022-02-07 DIAGNOSIS — E860 Dehydration: Secondary | ICD-10-CM | POA: Diagnosis present

## 2022-02-07 DIAGNOSIS — I499 Cardiac arrhythmia, unspecified: Secondary | ICD-10-CM | POA: Diagnosis not present

## 2022-02-07 DIAGNOSIS — K2210 Ulcer of esophagus without bleeding: Secondary | ICD-10-CM | POA: Diagnosis present

## 2022-02-07 DIAGNOSIS — I2510 Atherosclerotic heart disease of native coronary artery without angina pectoris: Secondary | ICD-10-CM | POA: Diagnosis present

## 2022-02-07 DIAGNOSIS — Y842 Radiological procedure and radiotherapy as the cause of abnormal reaction of the patient, or of later complication, without mention of misadventure at the time of the procedure: Secondary | ICD-10-CM | POA: Diagnosis present

## 2022-02-07 DIAGNOSIS — J449 Chronic obstructive pulmonary disease, unspecified: Secondary | ICD-10-CM | POA: Diagnosis not present

## 2022-02-07 DIAGNOSIS — Z794 Long term (current) use of insulin: Secondary | ICD-10-CM | POA: Diagnosis not present

## 2022-02-07 DIAGNOSIS — Z923 Personal history of irradiation: Secondary | ICD-10-CM

## 2022-02-07 DIAGNOSIS — Z823 Family history of stroke: Secondary | ICD-10-CM

## 2022-02-07 DIAGNOSIS — I69393 Ataxia following cerebral infarction: Secondary | ICD-10-CM

## 2022-02-07 DIAGNOSIS — I10 Essential (primary) hypertension: Secondary | ICD-10-CM | POA: Diagnosis not present

## 2022-02-07 DIAGNOSIS — I6501 Occlusion and stenosis of right vertebral artery: Secondary | ICD-10-CM | POA: Diagnosis not present

## 2022-02-07 DIAGNOSIS — Z85118 Personal history of other malignant neoplasm of bronchus and lung: Secondary | ICD-10-CM

## 2022-02-07 DIAGNOSIS — C3491 Malignant neoplasm of unspecified part of right bronchus or lung: Secondary | ICD-10-CM | POA: Diagnosis present

## 2022-02-07 DIAGNOSIS — F17210 Nicotine dependence, cigarettes, uncomplicated: Secondary | ICD-10-CM | POA: Diagnosis not present

## 2022-02-07 DIAGNOSIS — R519 Headache, unspecified: Secondary | ICD-10-CM | POA: Diagnosis not present

## 2022-02-07 DIAGNOSIS — Z880 Allergy status to penicillin: Secondary | ICD-10-CM | POA: Diagnosis not present

## 2022-02-07 DIAGNOSIS — M47812 Spondylosis without myelopathy or radiculopathy, cervical region: Secondary | ICD-10-CM | POA: Diagnosis not present

## 2022-02-07 DIAGNOSIS — Z87891 Personal history of nicotine dependence: Secondary | ICD-10-CM | POA: Diagnosis not present

## 2022-02-07 DIAGNOSIS — Z79899 Other long term (current) drug therapy: Secondary | ICD-10-CM

## 2022-02-07 DIAGNOSIS — K219 Gastro-esophageal reflux disease without esophagitis: Secondary | ICD-10-CM | POA: Diagnosis present

## 2022-02-07 DIAGNOSIS — Z885 Allergy status to narcotic agent status: Secondary | ICD-10-CM

## 2022-02-07 DIAGNOSIS — I6930 Unspecified sequelae of cerebral infarction: Secondary | ICD-10-CM | POA: Diagnosis not present

## 2022-02-07 DIAGNOSIS — R112 Nausea with vomiting, unspecified: Secondary | ICD-10-CM | POA: Diagnosis not present

## 2022-02-07 DIAGNOSIS — R1319 Other dysphagia: Secondary | ICD-10-CM

## 2022-02-07 DIAGNOSIS — E119 Type 2 diabetes mellitus without complications: Secondary | ICD-10-CM | POA: Diagnosis not present

## 2022-02-07 DIAGNOSIS — J9811 Atelectasis: Secondary | ICD-10-CM | POA: Diagnosis not present

## 2022-02-07 DIAGNOSIS — R809 Proteinuria, unspecified: Secondary | ICD-10-CM | POA: Diagnosis not present

## 2022-02-07 DIAGNOSIS — Z6833 Body mass index (BMI) 33.0-33.9, adult: Secondary | ICD-10-CM | POA: Diagnosis not present

## 2022-02-07 DIAGNOSIS — R1310 Dysphagia, unspecified: Secondary | ICD-10-CM | POA: Diagnosis not present

## 2022-02-07 DIAGNOSIS — Z7982 Long term (current) use of aspirin: Secondary | ICD-10-CM

## 2022-02-07 DIAGNOSIS — J4489 Other specified chronic obstructive pulmonary disease: Secondary | ICD-10-CM | POA: Diagnosis present

## 2022-02-07 DIAGNOSIS — Z91038 Other insect allergy status: Secondary | ICD-10-CM

## 2022-02-07 DIAGNOSIS — R6510 Systemic inflammatory response syndrome (SIRS) of non-infectious origin without acute organ dysfunction: Secondary | ICD-10-CM | POA: Insufficient documentation

## 2022-02-07 DIAGNOSIS — E785 Hyperlipidemia, unspecified: Secondary | ICD-10-CM | POA: Diagnosis present

## 2022-02-07 DIAGNOSIS — R000 Tachycardia, unspecified: Secondary | ICD-10-CM | POA: Diagnosis not present

## 2022-02-07 DIAGNOSIS — G8929 Other chronic pain: Secondary | ICD-10-CM | POA: Diagnosis present

## 2022-02-07 DIAGNOSIS — Z1152 Encounter for screening for COVID-19: Secondary | ICD-10-CM

## 2022-02-07 DIAGNOSIS — Z8249 Family history of ischemic heart disease and other diseases of the circulatory system: Secondary | ICD-10-CM | POA: Diagnosis not present

## 2022-02-07 DIAGNOSIS — Z825 Family history of asthma and other chronic lower respiratory diseases: Secondary | ICD-10-CM

## 2022-02-07 DIAGNOSIS — E1129 Type 2 diabetes mellitus with other diabetic kidney complication: Secondary | ICD-10-CM | POA: Diagnosis not present

## 2022-02-07 DIAGNOSIS — K5730 Diverticulosis of large intestine without perforation or abscess without bleeding: Secondary | ICD-10-CM | POA: Diagnosis not present

## 2022-02-07 DIAGNOSIS — Z7984 Long term (current) use of oral hypoglycemic drugs: Secondary | ICD-10-CM

## 2022-02-07 DIAGNOSIS — Z888 Allergy status to other drugs, medicaments and biological substances status: Secondary | ICD-10-CM

## 2022-02-07 DIAGNOSIS — I69351 Hemiplegia and hemiparesis following cerebral infarction affecting right dominant side: Secondary | ICD-10-CM

## 2022-02-07 DIAGNOSIS — E876 Hypokalemia: Secondary | ICD-10-CM | POA: Diagnosis present

## 2022-02-07 DIAGNOSIS — E669 Obesity, unspecified: Secondary | ICD-10-CM | POA: Diagnosis not present

## 2022-02-07 DIAGNOSIS — R110 Nausea: Secondary | ICD-10-CM | POA: Diagnosis not present

## 2022-02-07 DIAGNOSIS — Z791 Long term (current) use of non-steroidal anti-inflammatories (NSAID): Secondary | ICD-10-CM

## 2022-02-07 DIAGNOSIS — Z7902 Long term (current) use of antithrombotics/antiplatelets: Secondary | ICD-10-CM

## 2022-02-07 DIAGNOSIS — Z803 Family history of malignant neoplasm of breast: Secondary | ICD-10-CM

## 2022-02-07 LAB — CBC WITH DIFFERENTIAL/PLATELET
Abs Immature Granulocytes: 0.04 10*3/uL (ref 0.00–0.07)
Basophils Absolute: 0 10*3/uL (ref 0.0–0.1)
Basophils Relative: 0 %
Eosinophils Absolute: 0.1 10*3/uL (ref 0.0–0.5)
Eosinophils Relative: 1 %
HCT: 43.7 % (ref 36.0–46.0)
Hemoglobin: 13.8 g/dL (ref 12.0–15.0)
Immature Granulocytes: 0 %
Lymphocytes Relative: 10 %
Lymphs Abs: 1.2 10*3/uL (ref 0.7–4.0)
MCH: 28.4 pg (ref 26.0–34.0)
MCHC: 31.6 g/dL (ref 30.0–36.0)
MCV: 89.9 fL (ref 80.0–100.0)
Monocytes Absolute: 0.6 10*3/uL (ref 0.1–1.0)
Monocytes Relative: 5 %
Neutro Abs: 10.1 10*3/uL — ABNORMAL HIGH (ref 1.7–7.7)
Neutrophils Relative %: 84 %
Platelets: 259 10*3/uL (ref 150–400)
RBC: 4.86 MIL/uL (ref 3.87–5.11)
RDW: 14.5 % (ref 11.5–15.5)
WBC: 12 10*3/uL — ABNORMAL HIGH (ref 4.0–10.5)
nRBC: 0 % (ref 0.0–0.2)

## 2022-02-07 LAB — COMPREHENSIVE METABOLIC PANEL
ALT: 12 U/L (ref 0–44)
AST: 23 U/L (ref 15–41)
Albumin: 5 g/dL (ref 3.5–5.0)
Alkaline Phosphatase: 69 U/L (ref 38–126)
Anion gap: 15 (ref 5–15)
BUN: 19 mg/dL (ref 8–23)
CO2: 24 mmol/L (ref 22–32)
Calcium: 9.4 mg/dL (ref 8.9–10.3)
Chloride: 101 mmol/L (ref 98–111)
Creatinine, Ser: 0.66 mg/dL (ref 0.44–1.00)
GFR, Estimated: >60 mL/min (ref >60)
Glucose, Bld: 138 mg/dL — ABNORMAL HIGH (ref 70–99)
Potassium: 3.6 mmol/L (ref 3.5–5.1)
Sodium: 140 mmol/L (ref 135–145)
Total Bilirubin: 1.9 mg/dL — ABNORMAL HIGH (ref 0.3–1.2)
Total Protein: 8.3 g/dL — ABNORMAL HIGH (ref 6.5–8.1)

## 2022-02-07 LAB — TROPONIN I (HIGH SENSITIVITY)
Troponin I (High Sensitivity): 4 ng/L (ref <18)
Troponin I (High Sensitivity): 5 ng/L (ref <18)

## 2022-02-07 LAB — RESP PANEL BY RT-PCR (RSV, FLU A&B, COVID)  RVPGX2
Influenza A by PCR: NEGATIVE
Influenza B by PCR: NEGATIVE
Resp Syncytial Virus by PCR: NEGATIVE
SARS Coronavirus 2 by RT PCR: NEGATIVE

## 2022-02-07 MED ORDER — FENTANYL CITRATE PF 50 MCG/ML IJ SOSY
50.0000 ug | PREFILLED_SYRINGE | Freq: Once | INTRAMUSCULAR | Status: AC
  Administered 2022-02-07: 50 ug via INTRAVENOUS
  Filled 2022-02-07: qty 1

## 2022-02-07 MED ORDER — HYDRALAZINE HCL 20 MG/ML IJ SOLN: 5.0000 mg | Freq: Four times a day (QID) | INTRAMUSCULAR | Status: DC | PRN

## 2022-02-07 MED ORDER — FLEET ENEMA 7-19 GM/118ML RE ENEM: 1.0000 | ENEMA | Freq: Once | RECTAL | Status: DC | PRN

## 2022-02-07 MED ORDER — KETOROLAC TROMETHAMINE 15 MG/ML IJ SOLN
15.0000 mg | Freq: Four times a day (QID) | INTRAMUSCULAR | Status: DC | PRN
  Administered 2022-02-07 – 2022-02-08 (×4): 15 mg via INTRAVENOUS
  Filled 2022-02-07 (×4): qty 1

## 2022-02-07 MED ORDER — ONDANSETRON HCL 4 MG/2ML IJ SOLN
4.0000 mg | Freq: Once | INTRAMUSCULAR | Status: AC
  Administered 2022-02-07: 4 mg via INTRAVENOUS
  Filled 2022-02-07: qty 2

## 2022-02-07 MED ORDER — IOHEXOL 300 MG/ML  SOLN
100.0000 mL | Freq: Once | INTRAMUSCULAR | Status: AC | PRN
  Administered 2022-02-07: 100 mL via INTRAVENOUS

## 2022-02-07 MED ORDER — SODIUM CHLORIDE 0.9 % IV SOLN: INTRAVENOUS | Status: DC

## 2022-02-07 MED ORDER — SODIUM CHLORIDE 0.9 % IV BOLUS
1000.0000 mL | Freq: Once | INTRAVENOUS | Status: AC
  Administered 2022-02-07: 1000 mL via INTRAVENOUS

## 2022-02-07 NOTE — ED Triage Notes (Signed)
First Nurse: Pt here via [facility] from [facility] with trouble swallowing and nausea x3 days. Pt has not taken her meds d/t same. Pt denies choking, pt has been seen at another facility for same.   135/80 96% RA 120

## 2022-02-07 NOTE — ED Provider Notes (Signed)
[facility] Provider Note    Event Date/Time   First MD Initiated Contact with Patient 02/07/22 1413     (approximate)   History   Dysphagia   HPI  Yvette Riley is a 67 y.o. female who comes in with concern for food bolus for the past 3 to 4 days.  She reports that she is without procedure done at [facility] but they sent her to the nursing home.  She reports that she vomits everything back up including water.  I reviewed a note from 12/06/2021 where she had possible vocal cord paralysis noted on CT scan.  She has a history of COPD, coronary disease, stroke, diabetes, small cell lung cancer status post chemoradiation.  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  She is initially unable to keep down liquids but then was tolerating soft foods without intervention she had an EGD last done on 11/9 that did not show any signs of esophageal stricture  Patient reports initially that she has had symptoms for the past few weeks but then later states that it is actually been going on for the past few days.  Is unclear if she is actually had anything that she tried to eat that got stuck or this is just been kind of gradually progressing.  She is not the best historian.  She also reports some abdominal pain and back pain as well.  Denies any chest pain, shortness of breath.  Physical Exam   Triage Vital Signs: ED Triage Vitals  Enc Vitals Group     BP 02/07/22 1331 129/84     Pulse Rate 02/07/22 1331 (!) 139     Resp 02/07/22 1331 18     Temp 02/07/22 1331 98.7 F (37.1 C)     Temp Source 02/07/22 1331 Oral     SpO2 02/07/22 1331 94 %     Weight --      Height --      Head Circumference --      Peak Flow --      Pain Score 02/07/22 1332 7     Pain Loc --      Pain Edu? --      Excl. in GC? --     Most recent vital signs: Vitals:   02/07/22 1331  BP: 129/84  Pulse: (!) 139  Resp: 18  Temp: 98.7 F  (37.1 C)  SpO2: 94%     General: Awake, no distress.  CV:  Good peripheral perfusion.  Tachycardic Resp:  Normal effort.  Abd:  No distention.  Some mild tenderness but no rebound, no guarding Other:     ED Results / Procedures / Treatments   Labs (all labs ordered are listed, but only abnormal results are displayed) Labs Reviewed  CBC WITH DIFFERENTIAL/PLATELET  COMPREHENSIVE METABOLIC PANEL  TROPONIN I (HIGH SENSITIVITY)     EKG  My interpretation of EKG:  Sinus tachycardia rate of 127 without any ST elevation, T wave inversion in V2 lead to lead III V3  RADIOLOGY I have reviewed the xray personally and intepretted and no PNA   IMPRESSION: Minimal left basilar atelectasis.    PROCEDURES:  Critical Care performed: No  Procedures   MEDICATIONS ORDERED IN ED: Medications  sodium chloride 0.9 % bolus 1,000 mL (1,000 mLs Intravenous New Bag/Given 02/07/22 1530)  iohexol (OMNIPAQUE) 300 MG/ML solution 100 mL (100 mLs Intravenous Contrast Given 02/07/22 1627)  fentaNYL (  SUBLIMAZE) injection 50 mcg (50 mcg Intravenous Given 02/07/22 1652)  ondansetron (ZOFRAN) injection 4 mg (4 mg Intravenous Given 02/07/22 1652)     IMPRESSION / MDM / ASSESSMENT AND PLAN / ED COURSE  I reviewed the triage vital signs and the nursing notes.   Patient's presentation is most consistent with acute presentation with potential threat to life or bodily function.   Patient comes in with tachycardia not able to tolerate p.o. unclear if this is related to abdominal issue or neck issue given she has had similar complaints previously and negative endoscopies I discussed the case with Dr. Tobi Bastos from GI so that he is aware.  Cbc elevated white count 12  CMP normal except t bili 1.9 Trop negative  IMPRESSION: 1. Mild diffuse colonic wall thickening versus normal under distension. Correlate clinically for colitis. 2. Patchy nodular ground-glass and airspace opacities in both lung bases, new  from prior, likely infectious/inflammatory.  Patient denies any diarrhea to suggest colitis.  She reports chronic cough I have lower suspicion for acute pneumonia.  Her CT neck was similar to prior and I discussed the case with GI Dr. Tobi Bastos who plan to scope her tomorrow does not need any other studies tonight.  Patient remains tachycardic.  I have discussed the hospital team for admission.       FINAL CLINICAL IMPRESSION(S) / ED DIAGNOSES   Final diagnoses:  None     Rx / DC Orders   ED Discharge Orders     None        Note:  This document was prepared using Dragon voice recognition software and may include unintentional dictation errors.   Concha Se, MD 02/08/22 [phone]

## 2022-02-07 NOTE — H&P (Addendum)
HISTORY AND PHYSICAL    KATHERIN RAMEY   YDX:[id number] DOB: Jan 12, 1956   Date of Service: 02/07/22 Requesting physician/APP from ED: Treatment Team:  Attending Provider: Emeterio Reeve, DO  PCP: Birdie Sons, MD     HPI: Yvette Riley is a 67 y.o. female with PMH CVA with residual right-sided weakness on aspirin/Plavix, CAD, HLD, DM2, HTN, GERD, lung cancer in remission status post radiation/chemotherapy, asthma/COPD, anxiety/depression, chronic pain on long term opiates.    Patient is a poor historian.  She reports history of dysphagia/difficulty swallowing, unclear timing but she states at least for a couple of weeks, worse over the past week or so to the point where she is unable to keep down liquids or solids, cannot recall last solid meal she had.  When asked about abdominal pain, she says "yeah, it is just uncomfortable," and then points to her left lower back, denies true abdominal pain but does report some "feels weird" on palpation of left lower quadrant on exam as below.  Denies diarrhea/constipation states she is not having bowel movements "there is nothing in there."  States she is "sort of nauseous but not really."  Denies fever, denies cough, reports mild headache.  Uncertain when she last took her usual p.o. medications, states she has not been able to keep pills down.  States she was hospitalized with [facility] "few weeks ago" but she is not sure what month it is now, thinks it was sometime after Thanksgiving and before Christmas, we do have records from the hospitalization 11/22/2021 to 12/07/2021 with regurgitation of stomach contents/unable to eat noted on discharge diagnoses and noted that she had an EGD 12/06/2021 which noted no esophageal stricture, normal duodenum, biopsied erythematous gastric mucosa, changes consistent with prior radiation therapy -patient states "they were supposed to do some kind of procedure but they sent me away and did not do anything."      Consultants:  Gastroenterology, Dr. 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 except sips/ice chips  SIRS (systemic inflammatory response syndrome) ([place]) Likely due to dehydration  IV fluids, normal saline No obvious source infection, patient is not symptomatic for colitis or pneumonia despite imaging findings, will hold off on antibiotics at this time, no sepsis suspected at this time but will monitor closely  COPD (chronic obstructive pulmonary disease) ([place]) Smoking greater than 30 pack years -patient quit when diagnosed with lung cancer Hx Small cell lung cancer, right ([place]) Radiation sequela likely causing esophageal abnormality/dysphagia DuoNebs as needed To take p.o., resume montelukast  Coronary atherosclerosis Essential hypertension Once able to take p.o., resume beta-blocker, aspirin, ACE, fibrate (statin intolerant) Hydralazine as needed elevated blood pressure  History of cerebellar CVA with residual deficit 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  Statin intolerant Right vertebral artery stenosis outpatient follow-up, no new CVA symptoms  Type 2 diabetes mellitus ([place]) A1c Holding p.o. medications for  now Start sliding scale once able to take p.o.     DVT prophylaxis: SCD Pertinent IV fluids/nutrition: IV fluids as above, n.p.o. pending EGD Central lines / invasive devices: None  Code Status: Full code, confirmed with patient at bedside upon admission Family Communication: Patient confirms her brother as emergency contact. I called him 02/07/22 6:50 PM and left HIPAA compliant voicemail re: non urgent update on family member under my care   Disposition: Inpatient given needing IV fluids TOC needs: None at this time Barriers to discharge / significant pending items: Pending EGD and ability to take p.o., hopefully able to discharge in the next 2 days or so                  Review of Systems:  Review of Systems  Constitutional:  Negative for chills, fever, malaise/fatigue and weight loss.  HENT:  Negative for sinus pain and sore throat.   Respiratory:  Negative for cough, hemoptysis and shortness of breath.   Cardiovascular:  Negative for chest pain, palpitations, orthopnea and leg swelling.  Gastrointestinal:  Positive for nausea and vomiting. Negative for abdominal pain, blood in stool, constipation and diarrhea.  Genitourinary:  Negative for dysuria, frequency and urgency.  Musculoskeletal:  Positive for back pain, joint pain and neck pain. Negative for myalgias.  Skin:  Negative for rash.  Neurological:  Positive for headaches. Negative for dizziness, tingling, tremors, speech change, focal weakness, seizures, loss of consciousness and weakness.  Psychiatric/Behavioral:  Negative for depression, substance abuse and suicidal ideas.        has a past medical history of Allergy, Anemia, Anxiety, Arthritis, Asthma, Blood transfusion without reported diagnosis, C. difficile diarrhea (04/07/2016), CAP (community acquired pneumonia) (03/21/2015), Combined forms of age-related cataract of both eyes (05/06/2016), COPD (chronic obstructive pulmonary disease) ([place]), CVA  (cerebral vascular accident) ([place]) (06/17/2018), Diabetes mellitus without complication ([place]), Displacement of lumbar intervertebral disc without myelopathy, Emphysema of lung ([place]), GERD (gastroesophageal reflux disease), Glaucoma, History of chicken pox, Hyperlipidemia, Hypertension, Pneumonia (03/29/2015), Sepsis ([place]) (03/17/2016), Shortness of breath, and Small cell lung cancer ([place]). (Not in an outpatient encounter)   Allergies  Allergen Reactions   Codeine Anaphylaxis   Other Anaphylaxis   Penicillins Anaphylaxis, Rash and Other (See Comments)    Reaction:  Tongue swelling  Has patient had a PCN reaction causing immediate rash, facial/tongue/throat swelling, SOB or lightheadedness with hypotension:  Yes   Has patient had a PCN reaction causing severe rash involving mucus membranes or skin necrosis: No Has patient had a PCN reaction that required hospitalization No Has patient had a PCN reaction occurring within the last 10 years: No If all of the above answers are "NO", then may proceed with Cephalosporin use.   Yellow Jacket Venom [Bee Venom] Anaphylaxis   Crestor [Rosuvastatin] Nausea And Vomiting    Corrected prior adverse reaction per [facility] [employer]. On 12/02/3011  patient reported N & V when she takes Crestor   Gemfibrozil Rash, Nausea And Vomiting and Swelling   Trazodone And Nefazodone Nausea And Vomiting   Lipitor [Atorvastatin] Nausea Only    By patient report 12/02/2011. Had also been prescribed pravastatin and lovastatin by previous MD. Unclear if those caused same side effects.       family history includes COPD in her brother; Cancer in her maternal aunt; Heart disease in her brother and  mother; Heart failure in her mother; Stroke in her mother. Past Surgical History:  Procedure Laterality Date   CESAREAN SECTION     TUBAL LIGATION     UPPER GI ENDOSCOPY            Objective Findings:  Vitals:   02/07/22 1331 02/07/22 1532 02/07/22 1600 02/07/22 1659  BP:  129/84 129/76 129/83 (!) 118/90  Pulse: (!) 139 (!) 117 (!) 115 (!) 113  Resp: 18 18 18 17   Temp: 98.7 F (37.1 C)     TempSrc: Oral     SpO2: 94% 97% 98% 98%   No intake or output data in the 24 hours ending 02/07/22 1807 There were no vitals filed for this visit.  Examination:  Physical Exam Constitutional:      General: She is not in acute distress.    Appearance: Normal appearance. She is obese.  HENT:     Head: Normocephalic and atraumatic.     Mouth/Throat:     Mouth: Mucous membranes are dry.  Eyes:     Extraocular Movements: Extraocular movements intact.  Cardiovascular:     Rate and Rhythm: Normal rate and regular rhythm.     Heart sounds: Normal heart sounds.  Pulmonary:     Effort: Pulmonary effort is normal. No respiratory distress.  Abdominal:     General: Abdomen is flat. Bowel sounds are normal. There is no distension.     Palpations: Abdomen is soft. There is no mass.     Tenderness: There is abdominal tenderness (report tender in LLQ but "more uncomfortable doesnt really hurt"). There is no guarding or rebound.  Musculoskeletal:     Cervical back: Normal range of motion.     Right lower leg: No edema.     Left lower leg: No edema.  Skin:    General: Skin is warm and dry.  Neurological:     Mental Status: She is alert and oriented to person, place, and time. Mental status is at baseline.     Motor: Weakness (RUE) present.  Psychiatric:        Mood and Affect: Mood normal.        Behavior: Behavior normal.         Current Outpatient Medications  Medication Instructions   albuterol (PROVENTIL HFA) 108 (90 Base) MCG/ACT inhaler 2 puffs, Inhalation, Every 6 hours PRN   Alirocumab (PRALUENT) 150 MG/ML SOAJ 1 mL, Subcutaneous, Every 14 days   aspirin EC 81 mg, Oral, Daily, Swallow whole.   benzonatate (TESSALON) 100 MG capsule Take 1 capsule by mouth three times daily as needed for cough   Blood Glucose Monitoring Suppl (ONE TOUCH ULTRA 2) w/Device  KIT Use to check sugar daily for type 2 diabetes E11.9   clonazePAM (KLONOPIN) 0.5 mg, Oral, 2 times daily PRN   clopidogrel (PLAVIX) 75 mg, Oral, Daily   cyanocobalamin (VITAMIN B12) 1,000 mcg, Oral, Daily   diclofenac Sodium (VOLTAREN) 4 g, Topical, 4 times daily   ezetimibe (ZETIA) 10 MG tablet Take 1 tablet by mouth once daily   fenofibrate 160 mg, Oral, Daily   gabapentin (NEURONTIN) 600 MG tablet Take one tablet up to 4 times a day   HUMALOG 100 UNIT/ML injection Subcutaneous   Iron (Ferrous Sulfate) 325 mg, Oral, Daily   lisinopril (ZESTRIL) 10 mg, Oral, Daily   meloxicam (MOBIC) 15 MG tablet TAKE 1 TABLET BY MOUTH ONCE DAILY AS NEEDED FOR PAIN   metFORMIN (GLUCOPHAGE-XR) 500 mg, Oral, 2 times daily  with meals   metoprolol succinate (TOPROL-XL) 25 mg, Oral, Daily   metoprolol tartrate (LOPRESSOR) 12.5 mg, Oral, 2 times daily   montelukast (SINGULAIR) 10 mg, Oral, Daily at bedtime   mupirocin ointment (BACTROBAN) 2 % 1 Application, Topical, 3 times daily   Narcan 0.4 mg, Nasal,  Once   NYSTATIN powder Topical   ondansetron (ZOFRAN ODT) 4 mg, Oral, Every 8 hours PRN   ondansetron (ZOFRAN) 4 MG tablet Oral   oxyCODONE-acetaminophen (PERCOCET) 10-325 MG tablet TAKE ONE TABLET BY MOUTH EVERY 4 HOURS AS NEEDED (VIAL)   pantoprazole (PROTONIX) 20 mg, Oral, Daily   senna (SENOKOT) 8.6 MG TABS tablet 2 tablets, Oral, Daily   sertraline (ZOLOFT) 50 mg, Oral, Daily   sucralfate (CARAFATE) 1 g tablet TAKE 1 TABLET BY MOUTH 4 TIMES DAILY (WITH MEALS AND AT BEDTIME)   Tiotropium Bromide-Olodaterol 2.5-2.5 MCG/ACT AERS 2 puffs, Inhalation, Daily   umeclidinium-vilanterol (ANORO ELLIPTA) 62.5-25 MCG/ACT AEPB 1 puff, Inhalation, Daily   zaleplon (SONATA) 10 MG capsule TAKE ONE CAPSULE BY MOUTH DAILY AT 9PM AT BEDTIME MAY TAKE SECOND CAPSULE AFTER 2 HOURS IF NEEDED (VIAL)        Data Reviewed: I have personally reviewed following labs and imaging studies  CBC: Recent Labs  Lab  02/07/22 1334  WBC 12.0*  NEUTROABS 10.1*  HGB 13.8  HCT 43.7  MCV 89.9  PLT 237   Basic Metabolic Panel: Recent Labs  Lab 02/07/22 1334  NA 140  K 3.6  CL 101  CO2 24  GLUCOSE 138*  BUN 19  CREATININE 0.66  CALCIUM 9.4   GFR: CrCl cannot be calculated (Unknown ideal weight.). Liver Function Tests: Recent Labs  Lab 02/07/22 1334  AST 23  ALT 12  ALKPHOS 69  BILITOT 1.9*  PROT 8.3*  ALBUMIN 5.0   No results for input(s): "LIPASE", "AMYLASE" in the last 168 hours. No results for input(s): "AMMONIA" in the last 168 hours. Coagulation Profile: No results for input(s): "INR", "PROTIME" in the last 168 hours. Cardiac Enzymes: No results for input(s): "CKTOTAL", "CKMB", "CKMBINDEX", "TROPONINI" in the last 168 hours. BNP (last 3 results) No results for input(s): "PROBNP" in the last 8760 hours. HbA1C: No results for input(s): "HGBA1C" in the last 72 hours. CBG: No results for input(s): "GLUCAP" in the last 168 hours. Lipid Profile: No results for input(s): "CHOL", "HDL", "LDLCALC", "TRIG", "CHOLHDL", "LDLDIRECT" in the last 72 hours. Thyroid Function Tests: No results for input(s): "TSH", "T4TOTAL", "FREET4", "T3FREE", "THYROIDAB" in the last 72 hours. Anemia Panel: No results for input(s): "VITAMINB12", "FOLATE", "FERRITIN", "TIBC", "IRON", "RETICCTPCT" in the last 72 hours. Most Recent Urinalysis On File:   Sepsis Labs: @LABRCNTIP (procalcitonin:4,lacticidven:4)  Recent Results (from the past 240 hour(s))  Resp panel by RT-PCR (RSV, Flu A&B, Covid) Anterior Nasal Swab     Status: None   Collection Time: 02/07/22  4:43 PM   Specimen: Anterior Nasal Swab  Result Value Ref Range Status   SARS Coronavirus 2 by RT PCR NEGATIVE NEGATIVE Final    Comment: (NOTE) SARS-CoV-2 target nucleic acids are NOT DETECTED.  The SARS-CoV-2 RNA is generally detectable in upper respiratory specimens during the acute phase of infection. The lowest concentration of SARS-CoV-2  viral copies this assay can detect is 138 copies/mL. A negative result does not preclude SARS-Cov-2 infection and should not be used as the sole basis for treatment or other patient management decisions. A negative result may occur with  improper specimen collection/handling, submission of specimen other than nasopharyngeal  swab, presence of viral mutation(s) within the areas targeted by this assay, and inadequate number of viral copies(<138 copies/mL). A negative result must be combined with clinical observations, patient history, and epidemiological information. The expected result is Negative.  Fact Sheet for Patients:  EntrepreneurPulse.com.au  Fact Sheet for Healthcare Providers:  IncredibleEmployment.be  This test is no t yet approved or cleared by the [place] FDA and  has been authorized for detection and/or diagnosis of SARS-CoV-2 by FDA under an Emergency Use Authorization (EUA). This EUA will remain  in effect (meaning this test can be used) for the duration of the COVID-19 declaration under Section 564(b)(1) of the Act, 21 U.S.C.section 360bbb-3(b)(1), unless the authorization is terminated  or revoked sooner.       Influenza A by PCR NEGATIVE NEGATIVE Final   Influenza B by PCR NEGATIVE NEGATIVE Final    Comment: (NOTE) The Xpert Xpress SARS-CoV-2/FLU/RSV plus assay is intended as an aid in the diagnosis of influenza from Nasopharyngeal swab specimens and should not be used as a sole basis for treatment. Nasal washings and aspirates are unacceptable for Xpert Xpress SARS-CoV-2/FLU/RSV testing.  Fact Sheet for Patients: EntrepreneurPulse.com.au  Fact Sheet for Healthcare Providers: IncredibleEmployment.be  This test is not yet approved or cleared by the [place] FDA and has been authorized for detection and/or diagnosis of SARS-CoV-2 by FDA under an Emergency Use Authorization  (EUA). This EUA will remain in effect (meaning this test can be used) for the duration of the COVID-19 declaration under Section 564(b)(1) of the Act, 21 U.S.C. section 360bbb-3(b)(1), unless the authorization is terminated or revoked.     Resp Syncytial Virus by PCR NEGATIVE NEGATIVE Final    Comment: (NOTE) Fact Sheet for Patients: EntrepreneurPulse.com.au  Fact Sheet for Healthcare Providers: IncredibleEmployment.be  This test is not yet approved or cleared by the [place] FDA and has been authorized for detection and/or diagnosis of SARS-CoV-2 by FDA under an Emergency Use Authorization (EUA). This EUA will remain in effect (meaning this test can be used) for the duration of the COVID-19 declaration under Section 564(b)(1) of the Act, 21 U.S.C. section 360bbb-3(b)(1), unless the authorization is terminated or revoked.  Performed at [facility], [address] [place], [place] [address]          Radiology Studies: CT HEAD WO CONTRAST (5MM)  Result Date: 02/07/2022 CLINICAL DATA:  New onset headache. Difficulty swallowing and nausea. EXAM: CT HEAD WITHOUT CONTRAST TECHNIQUE: Contiguous axial images were obtained from the base of the skull through the vertex without intravenous contrast. RADIATION DOSE REDUCTION: This exam was performed according to the departmental dose-optimization program which includes automated exposure control, adjustment of the mA and/or kV according to patient size and/or use of iterative reconstruction technique. COMPARISON:  05/09/2021 FINDINGS: Brain: Remote right cerebellar stroke. Small remote right occipital lobe stroke. Periventricular white matter and corona radiata hypodensities favor chronic ischemic microvascular white matter disease. Otherwise, the brainstem, cerebellum, cerebral peduncles, thalamus, basal ganglia, basilar cisterns, and ventricular system appear within normal limits. No  intracranial hemorrhage, mass lesion, or acute CVA. Vascular: Unremarkable Skull: Unremarkable Sinuses/Orbits: Unremarkable Other: No supplemental non-categorized findings. IMPRESSION: 1. No acute intracranial findings. 2. Remote right cerebellar and right occipital lobe infarcts. 3. Periventricular white matter and corona radiata hypodensities favor chronic ischemic microvascular white matter disease. Electronically Signed   By: Van Clines M.D.   On: 02/07/2022 17:07   CT Soft Tissue Neck W Contrast  Result Date: 02/07/2022 CLINICAL DATA:  Soft  tissue infection suspected, neck, xray done EXAM: CT NECK WITH CONTRAST TECHNIQUE: Multidetector CT imaging of the neck was performed using the standard protocol following the bolus administration of intravenous contrast. RADIATION DOSE REDUCTION: This exam was performed according to the departmental dose-optimization program which includes automated exposure control, adjustment of the mA and/or kV according to patient size and/or use of iterative reconstruction technique. CONTRAST:  159mL OMNIPAQUE IOHEXOL 300 MG/ML  SOLN COMPARISON:  None Available. FINDINGS: Pharynx and larynx: Mildly prominent tonsillar tissue without discrete mass. Unremarkable larynx. Salivary glands: No inflammation, mass, or stone. Thyroid: Normal. Lymph nodes: None enlarged or abnormal density. Vascular: Suspected severe stenosis of the right V2 vertebral artery due to osteophytes. Limited intracranial: Better evaluated on same day CT head. Visualized orbits: Negative. Mastoids and visualized paranasal sinuses: Clear. Skeleton: Multilevel degenerative change. No acute findings on limited assessment. Upper chest: Soft tissue filling and expanding the partially visualized intrathoracic esophagus. Partially imaged treatment change in the right perihilar region. IMPRESSION: 1. Soft tissue filling and expanding the partially visualized intrathoracic esophagus. The appearance is not  substantially changed relative to CT of the chest from April 19, 23, but findings are incompletely assessed/imaged. Consider further evaluation with endoscopy to evaluate for stricture and/or malignancy. 2. Partially imaged treatment change in the right perihilar region. CT of the chest could further characterize. 3. Suspected severe stenosis of the right V2 vertebral artery due to osteophytes. A CTA of the neck could further evaluate if clinically warranted. Electronically Signed   By: Margaretha Sheffield M.D.   On: 02/07/2022 16:57   CT ABDOMEN PELVIS W CONTRAST  Result Date: 02/07/2022 CLINICAL DATA:  Nausea and abdominal pain. EXAM: CT ABDOMEN AND PELVIS WITH CONTRAST TECHNIQUE: Multidetector CT imaging of the abdomen and pelvis was performed using the standard protocol following bolus administration of intravenous contrast. RADIATION DOSE REDUCTION: This exam was performed according to the departmental dose-optimization program which includes automated exposure control, adjustment of the mA and/or kV according to patient size and/or use of iterative reconstruction technique. CONTRAST:  120mL OMNIPAQUE IOHEXOL 300 MG/ML  SOLN COMPARISON:  CT abdomen and pelvis 05/16/2021 FINDINGS: Lower chest: Are ill-defined patchy nodular ground-glass and airspace opacities in both lung bases, new from prior. Hepatobiliary: No focal liver abnormality is seen. No gallstones, gallbladder wall thickening, or biliary dilatation. Pancreas: Unremarkable. No pancreatic ductal dilatation or surrounding inflammatory changes. Spleen: Normal in size without focal abnormality. Adrenals/Urinary Tract: Adrenal glands are unremarkable. Kidneys are normal, without renal calculi, focal lesion, or hydronephrosis. Bladder is unremarkable. Stomach/Bowel: There is mild diffuse colonic wall thickening versus normal under distension. There is no surrounding inflammation. The appendix, stomach and small bowel appear within normal limits. There are  few scattered colonic diverticula. No dilated bowel loops. No free air. Vascular/Lymphatic: Aortic atherosclerosis. No enlarged abdominal or pelvic lymph nodes. Reproductive: Uterus and bilateral adnexa are unremarkable. Other: No abdominal wall hernia or abnormality. No abdominopelvic ascites. Musculoskeletal: Mild chronic compression deformity of L1 is unchanged. No acute fractures are seen. IMPRESSION: 1. Mild diffuse colonic wall thickening versus normal under distension. Correlate clinically for colitis. 2. Patchy nodular ground-glass and airspace opacities in both lung bases, new from prior, likely infectious/inflammatory. Aortic Atherosclerosis (ICD10-I70.0). Electronically Signed   By: Ronney Asters M.D.   On: 02/07/2022 16:52   DG Chest 2 View  Result Date: 02/07/2022 CLINICAL DATA:  Tachycardia EXAM: CHEST - 2 VIEW COMPARISON:  05/16/2021, 09/06/2021 FINDINGS: Stable cardiomediastinal contours. Post treatment changes with scarring in the right perihilar  region with associated volume loss. Minimal left basilar atelectasis. Lungs are otherwise clear. No pleural effusion or pneumothorax. IMPRESSION: Minimal left basilar atelectasis. Electronically Signed   By: Davina Poke D.O.   On: 02/07/2022 14:53             LOS: 0 days       Emeterio Reeve, DO [facility] 02/07/2022, 6:07 PM    Dictation software may have been used to generate the above note. Typos may occur and escape review in typed/dictated notes. Please contact Dr Sheppard Coil directly for clarity if needed.  Staff may message me via secure chat in [place]  but this may not receive an immediate response,  please page me for urgent matters!  If 7PM-7AM, please contact night coverage [email or web address]

## 2022-02-07 NOTE — ED Notes (Signed)
Assisted pt to restroom  

## 2022-02-07 NOTE — ED Triage Notes (Addendum)
Pt presents to ED with c/o of food bolus for the past 3-4 days. Pt mentions she was supposed to have a procedure for this at [facility] but states it never happened and then sent her to a nursing home. Pt states she vomits everything back up including water. Pt tachycardiac in triage.

## 2022-02-07 NOTE — ED Notes (Signed)
This RN to bedside to collect blood-work and start fluids but pt leaving with imaging staff. Will complete once pt back to room. Pt's speech clear, pt doesn't remember what it was that she ate when she began to feel like something was stuck in the back of her throat, pt's resp reg/unlabored, skin dry and is calm.

## 2022-02-07 NOTE — ED Notes (Signed)
Called for transport to room.

## 2022-02-07 NOTE — ED Notes (Signed)
NT confirms will capture EKG soon.

## 2022-02-07 NOTE — ED Notes (Signed)
Pt called out for pain meds. MD notified. Pt gone to CT

## 2022-02-07 NOTE — ED Notes (Signed)
NT to bedside with portable EKG machine; notified pt left for imaging and should be back within next 10 minutes per imaging staff.

## 2022-02-08 ENCOUNTER — Inpatient Hospital Stay: Payer: Medicare HMO | Admitting: Anesthesiology

## 2022-02-08 ENCOUNTER — Encounter
Admission: EM | Disposition: A | Discharge status: Skilled Nursing Facility | Payer: Self-pay | Source: Skilled Nursing Facility | Attending: Osteopathic Medicine

## 2022-02-08 ENCOUNTER — Encounter: Payer: Self-pay | Admitting: Osteopathic Medicine

## 2022-02-08 DIAGNOSIS — E1129 Type 2 diabetes mellitus with other diabetic kidney complication: Secondary | ICD-10-CM | POA: Diagnosis not present

## 2022-02-08 DIAGNOSIS — I2510 Atherosclerotic heart disease of native coronary artery without angina pectoris: Secondary | ICD-10-CM | POA: Diagnosis not present

## 2022-02-08 DIAGNOSIS — J449 Chronic obstructive pulmonary disease, unspecified: Secondary | ICD-10-CM | POA: Diagnosis not present

## 2022-02-08 DIAGNOSIS — R1319 Other dysphagia: Secondary | ICD-10-CM | POA: Diagnosis not present

## 2022-02-08 HISTORY — PX: ESOPHAGOGASTRODUODENOSCOPY (EGD) WITH PROPOFOL: SHX5813

## 2022-02-08 LAB — COMPREHENSIVE METABOLIC PANEL
ALT: 11 U/L (ref 0–44)
AST: 15 U/L (ref 15–41)
Albumin: 4.2 g/dL (ref 3.5–5.0)
Alkaline Phosphatase: 61 U/L (ref 38–126)
Anion gap: 11 (ref 5–15)
BUN: 19 mg/dL (ref 8–23)
CO2: 23 mmol/L (ref 22–32)
Calcium: 8.3 mg/dL — ABNORMAL LOW (ref 8.9–10.3)
Chloride: 109 mmol/L (ref 98–111)
Creatinine, Ser: 0.64 mg/dL (ref 0.44–1.00)
GFR, Estimated: >60 mL/min (ref >60)
Glucose, Bld: 113 mg/dL — ABNORMAL HIGH (ref 70–99)
Potassium: 3 mmol/L — ABNORMAL LOW (ref 3.5–5.1)
Sodium: 143 mmol/L (ref 135–145)
Total Bilirubin: 1.4 mg/dL — ABNORMAL HIGH (ref 0.3–1.2)
Total Protein: 7.3 g/dL (ref 6.5–8.1)

## 2022-02-08 LAB — CBC
HCT: 38.6 % (ref 36.0–46.0)
Hemoglobin: 12.1 g/dL (ref 12.0–15.0)
MCH: 28.5 pg (ref 26.0–34.0)
MCHC: 31.3 g/dL (ref 30.0–36.0)
MCV: 90.8 fL (ref 80.0–100.0)
Platelets: 196 10*3/uL (ref 150–400)
RBC: 4.25 MIL/uL (ref 3.87–5.11)
RDW: 14.5 % (ref 11.5–15.5)
WBC: 8.7 10*3/uL (ref 4.0–10.5)
nRBC: 0 % (ref 0.0–0.2)

## 2022-02-08 LAB — MRSA NEXT GEN BY PCR, NASAL: MRSA by PCR Next Gen: NOT DETECTED

## 2022-02-08 SURGERY — ESOPHAGOGASTRODUODENOSCOPY (EGD) WITH PROPOFOL
Anesthesia: General

## 2022-02-08 MED ORDER — UMECLIDINIUM-VILANTEROL 62.5-25 MCG/ACT IN AEPB
1.0000 | INHALATION_SPRAY | Freq: Every day | RESPIRATORY_TRACT | Status: DC
  Administered 2022-02-09: 1 via RESPIRATORY_TRACT
  Filled 2022-02-08: qty 14

## 2022-02-08 MED ORDER — GABAPENTIN 300 MG PO CAPS
300.0000 mg | ORAL_CAPSULE | Freq: Two times a day (BID) | ORAL | Status: DC
  Administered 2022-02-08 – 2022-02-09 (×2): 300 mg via ORAL
  Filled 2022-02-08 (×2): qty 1

## 2022-02-08 MED ORDER — OXYCODONE-ACETAMINOPHEN 10-325 MG PO TABS: 1.0000 | ORAL_TABLET | ORAL | Status: DC | PRN

## 2022-02-08 MED ORDER — CLOPIDOGREL BISULFATE 75 MG PO TABS
75.0000 mg | ORAL_TABLET | Freq: Every day | ORAL | Status: DC
  Administered 2022-02-08 – 2022-02-09 (×2): 75 mg via ORAL
  Filled 2022-02-08 (×2): qty 1

## 2022-02-08 MED ORDER — ONDANSETRON HCL 4 MG PO TABS: 4.0000 mg | ORAL_TABLET | Freq: Four times a day (QID) | ORAL | Status: DC | PRN

## 2022-02-08 MED ORDER — POTASSIUM CHLORIDE 10 MEQ/100ML IV SOLN
10.0000 meq | INTRAVENOUS | Status: AC
  Administered 2022-02-08 (×4): 10 meq via INTRAVENOUS
  Filled 2022-02-08 (×4): qty 100

## 2022-02-08 MED ORDER — OXYCODONE-ACETAMINOPHEN 5-325 MG PO TABS
1.0000 | ORAL_TABLET | ORAL | Status: DC | PRN
  Administered 2022-02-08 – 2022-02-09 (×2): 1 via ORAL
  Filled 2022-02-08 (×2): qty 1

## 2022-02-08 MED ORDER — PANTOPRAZOLE SODIUM 40 MG PO TBEC
40.0000 mg | DELAYED_RELEASE_TABLET | Freq: Every day | ORAL | Status: DC
  Administered 2022-02-08 – 2022-02-09 (×2): 40 mg via ORAL
  Filled 2022-02-08 (×2): qty 1

## 2022-02-08 MED ORDER — POLYETHYLENE GLYCOL 3350 17 G PO PACK: 17.0000 g | PACK | Freq: Every day | ORAL | Status: DC | PRN

## 2022-02-08 MED ORDER — SODIUM CHLORIDE 0.9 % IV SOLN: INTRAVENOUS | Status: DC

## 2022-02-08 MED ORDER — SERTRALINE HCL 50 MG PO TABS
50.0000 mg | ORAL_TABLET | Freq: Every day | ORAL | Status: DC
  Administered 2022-02-08 – 2022-02-09 (×2): 50 mg via ORAL
  Filled 2022-02-08 (×2): qty 1

## 2022-02-08 MED ORDER — ALBUTEROL SULFATE (2.5 MG/3ML) 0.083% IN NEBU: 2.5000 mg | INHALATION_SOLUTION | Freq: Four times a day (QID) | RESPIRATORY_TRACT | Status: DC | PRN

## 2022-02-08 MED ORDER — OXYCODONE HCL 5 MG PO TABS
5.0000 mg | ORAL_TABLET | ORAL | Status: DC | PRN
  Administered 2022-02-09: 5 mg via ORAL
  Filled 2022-02-08: qty 1

## 2022-02-08 MED ORDER — SODIUM CHLORIDE 0.9 % IV SOLN: INTRAVENOUS | Status: DC | PRN

## 2022-02-08 MED ORDER — VITAMIN B-12 1000 MCG PO TABS
1000.0000 ug | ORAL_TABLET | Freq: Every day | ORAL | Status: DC
  Administered 2022-02-08 – 2022-02-09 (×2): 1000 ug via ORAL
  Filled 2022-02-08 (×2): qty 1

## 2022-02-08 MED ORDER — PROPOFOL 10 MG/ML IV BOLUS
INTRAVENOUS | Status: DC | PRN
  Administered 2022-02-08: 40 mg via INTRAVENOUS
  Administered 2022-02-08: 80 mg via INTRAVENOUS

## 2022-02-08 MED ORDER — METOPROLOL SUCCINATE ER 25 MG PO TB24
25.0000 mg | ORAL_TABLET | Freq: Every day | ORAL | Status: DC
  Administered 2022-02-08 – 2022-02-09 (×2): 25 mg via ORAL
  Filled 2022-02-08 (×2): qty 1

## 2022-02-08 MED ORDER — GLUCERNA SHAKE PO LIQD
237.0000 mL | Freq: Three times a day (TID) | ORAL | Status: DC
  Administered 2022-02-08 – 2022-02-09 (×3): 237 mL via ORAL

## 2022-02-08 MED ORDER — SENNA 8.6 MG PO TABS
2.0000 | ORAL_TABLET | Freq: Every day | ORAL | Status: DC
  Administered 2022-02-08 – 2022-02-09 (×2): 17.2 mg via ORAL
  Filled 2022-02-08 (×2): qty 2

## 2022-02-08 NOTE — Transfer of Care (Signed)
Immediate Anesthesia Transfer of Care Note  Patient: Yvette Riley  Procedure(s) Performed: ESOPHAGOGASTRODUODENOSCOPY (EGD) WITH PROPOFOL  Patient Location: PACU and Endoscopy Unit  Anesthesia Type:General  Level of Consciousness: drowsy and patient cooperative  Airway & Oxygen Therapy: Patient Spontanous Breathing  Post-op Assessment: Report given to RN and Post -op Vital signs reviewed and stable  Post vital signs: Reviewed and stable  Last Vitals:  Vitals Value Taken Time  BP 121/82 02/08/22 1120  Temp 36 C 02/08/22 1117  Pulse 125 02/08/22 1120  Resp 16 02/08/22 1120  SpO2 89 % 02/08/22 1120  Vitals shown include unvalidated device data.  Last Pain:  Vitals:   02/08/22 1117  TempSrc: Temporal  PainSc: Asleep         Complications: No notable events documented.

## 2022-02-08 NOTE — Progress Notes (Signed)
PT Cancellation Note  Patient Details Name: Yvette Riley MRN: [id number] DOB: [birth year]   Cancelled Treatment:    Reason Eval/Treat Not Completed: PT screened, no needs identified, will sign off. Chart reviewed. Pt reports being at her baseline functional ADL performance level, and at her baseline UE functioning level as prior to this admission. Pt. Reports a history of multiple strokes affecting the RUE, and reports no change since prior to this hospital admission. Pt. Reports residing at [facility], and plans to return to the facility upon discharge.  PT services are not  warranted at this time. Pt. is in agreement with his. Will complete the order, and sign off.   2:56 PM, 02/08/22 Rosamaria Lints, PT, DPT Physical Therapist - [facility]  [phone] (ASCOM)    Franchon Ketterman C 02/08/2022, 2:56 PM

## 2022-02-08 NOTE — Op Note (Signed)
[facility] Gastroenterology Patient Name: Yvette Riley Procedure Date: 02/08/2022 11:05 AM MRN: [id number] Account #: [id number] Date of Birth: 08/28/1955 Admit Type: Inpatient Age: 68 Room: [facility] ENDO ROOM 4 Gender: Female Note Status: Finalized Instrument Name: Patton Salles Endoscope [id number] Procedure:             Upper GI endoscopy Indications:           Dysphagia Providers:             Wyline Mood MD, MD Referring MD:          Demetrios Isaacs. Sherrie Mustache, MD (Referring MD) Medicines:             Monitored Anesthesia Care Complications:         No immediate complications. Procedure:             Pre-Anesthesia Assessment:                        - Prior to the procedure, a History and Physical was                         performed, and patient medications, allergies and                         sensitivities were reviewed. The patient's tolerance                         of previous anesthesia was reviewed.                        - The risks and benefits of the procedure and the                         sedation options and risks were discussed with the                         patient. All questions were answered and informed                         consent was obtained.                        - ASA Grade Assessment: II - A patient with mild                         systemic disease.                        After obtaining informed consent, the endoscope was                         passed under direct vision. Throughout the procedure,                         the patient's blood pressure, pulse, and oxygen                         saturations were monitored continuously. The Endoscope                         was introduced  through the mouth, and advanced to the                         third part of duodenum. The upper GI endoscopy was                         accomplished with ease. The patient tolerated the                         procedure well. Findings:      The examined duodenum  was normal.      The stomach was normal.      The cardia and gastric fundus were normal on retroflexion.      One superficial esophageal ulcer with no stigmata of recent bleeding was       found in the middle third of the esophagus. The lesion was 20 mm in       largest dimension. Impression:            - Normal examined duodenum.                        - Normal stomach.                        - Esophageal ulcer with no stigmata of recent bleeding.                        - No specimens collected. Recommendation:        - Return patient to hospital ward for ongoing care.                        - get speech and language pathology assesment, once ok                         with them mechanical soft diet                        Commence on prilosec 40 mg BID for large esophageal                         ulcer ? pill esophagitis , carafate 1id for 8 weeks ,                         repeat EGD with [facility] to check for healing in 8 weeks                         , if healed and still has dysphagia consider EUS to                         rule out external compression . Procedure Code(s):     --- Professional ---                        [phone], Esophagogastroduodenoscopy, flexible,                         transoral; diagnostic, including collection of  specimen(s) by brushing or washing, when performed                         (separate procedure) Diagnosis Code(s):     --- Professional ---                        K22.10, Ulcer of esophagus without bleeding                        R13.10, Dysphagia, unspecified CPT copyright 2022 American Medical Association. All rights reserved. The codes documented in this report are preliminary and upon coder review may  be revised to meet current compliance requirements. Wyline Mood, MD Wyline Mood MD, MD 02/08/2022 11:16:02 AM This report has been signed electronically. Number of Addenda: 0 Note Initiated On: 02/08/2022 11:05 AM Estimated  Blood Loss:  Estimated blood loss: none.      [facility]

## 2022-02-08 NOTE — Progress Notes (Signed)
Patient returned from EGD

## 2022-02-08 NOTE — TOC Initial Note (Signed)
Transition of Care [facility]) - Initial/Assessment Note    Patient Details  Name: Yvette Riley MRN: [id number] Date of Birth: May 10, 1955  Transition of Care [facility]) CM/SW Contact:    Liliana Cline, LCSW Phone Number: 02/08/2022, 8:44 AM  Clinical Narrative:                 Patient is a long term care resident at [facility]. Their Admissions Worker Gavin Pound confirmed that patient can return when medically ready.   Expected Discharge Plan: Long Term Nursing Home Barriers to Discharge: Continued Medical Work up   Patient Goals and CMS Choice            Expected Discharge Plan and Services       Living arrangements for the past 2 months: Skilled Nursing Facility                                      Prior Living Arrangements/Services Living arrangements for the past 2 months: Skilled Nursing Facility Lives with:: Facility Resident Patient language and need for interpreter reviewed:: Yes              Criminal Activity/Legal Involvement Pertinent to Current Situation/Hospitalization: No - Comment as needed  Activities of Daily Living Home Assistive Devices/Equipment: Wheelchair ADL Screening (condition at time of admission) Patient's cognitive ability adequate to safely complete daily activities?: Yes Is the patient deaf or have difficulty hearing?: No Does the patient have difficulty seeing, even when wearing glasses/contacts?: No Does the patient have difficulty concentrating, remembering, or making decisions?: Yes Patient able to express need for assistance with ADLs?: Yes Does the patient have difficulty dressing or bathing?: No Independently performs ADLs?: Yes (appropriate for developmental age) Does the patient have difficulty walking or climbing stairs?: No Weakness of Legs: Right Weakness of Arms/Hands: Right  Permission Sought/Granted                  Emotional Assessment         Alcohol / Substance Use: Not Applicable Psych  Involvement: No (comment)  Admission diagnosis:  Dysphagia [R13.10] Patient Active Problem List   Diagnosis Date Noted   Dysphagia 02/07/2022   Vertebral artery stenosis, right 02/07/2022   SIRS (systemic inflammatory response syndrome) (HCC) 02/07/2022   Nausea & vomiting 02/07/2022   Abnormal red cell volume 05/16/2021   B12 deficiency 01/26/2021   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) 05/18/2019   History of COPD 05/18/2019   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   Chronic, continuous use of opioids 08/05/2018   History of cerebellar CVA with residual deficit 06/17/2018   Chronic cough 06/02/2018   Anxiety 07/19/2016   Small cell lung cancer, right (HCC) 05/13/2016   Bilateral presbyopia 05/06/2016   Combined forms of age-related cataract of both eyes 05/06/2016   Dry eye syndrome of both lacrimal glands 05/06/2016   Chemoprophylaxis 04/29/2016   Severe obesity (BMI 35.0-39.9) with comorbidity (HCC) 04/16/2016   Weakness generalized 04/06/2016   Mass of upper lobe of right lung 03/01/2016   Coronary atherosclerosis 02/29/2016   Allergic rhinitis 09/27/2014   Asthma 09/27/2014   Anemia 09/27/2014   Hyperglyceridemia, pure 09/27/2014   Glaucoma 09/27/2014   Hip pain 09/27/2014   Right knee pain 09/27/2014   Displacement of lumbar intervertebral disc without myelopathy 09/27/2014   Thoracic back pain 09/27/2014   Insomnia 08/11/2014   COPD (chronic  obstructive pulmonary disease) (HCC) 06/12/2014   Type 2 diabetes mellitus (HCC) 06/12/2014   Back pain 06/12/2014   Smoking greater than 30 pack years 03/28/2014   PCP:  Malva Limes, MD Pharmacy:   [facility] - [place], [place] - [address] [place] [address] Phone: [phone] Fax: [phone]  [facility] [address]), [place] - [address]) [place] [address] Phone: [phone] Fax:  [phone]     Social Determinants of Health (SDOH) Social History: SDOH Screenings   Food Insecurity: No Food Insecurity (02/08/2022)  Housing: Low Risk  (02/08/2022)  Transportation Needs: No Transportation Needs (02/08/2022)  Utilities: Not At Risk (02/08/2022)  Alcohol Screen: Low Risk  (09/25/2021)  Depression (PHQ2-9): High Risk (09/25/2021)  Financial Resource Strain: Low Risk  (09/05/2021)  Physical Activity: Inactive (09/05/2021)  Social Connections: Socially Isolated (12/13/2020)  Stress: Stress Concern Present (09/05/2021)  Tobacco Use: Medium Risk (02/07/2022)   SDOH Interventions:     Readmission Risk Interventions     No data to display

## 2022-02-08 NOTE — Plan of Care (Signed)
  Problem: Health Behavior/Discharge Planning: Goal: Ability to manage health-related needs will improve Outcome: Progressing   Problem: Education: Goal: Knowledge of General Education information will improve Description: Including pain rating scale, medication(s)/side effects and non-pharmacologic comfort measures Outcome: Progressing   Problem: Clinical Measurements: Goal: Ability to maintain clinical measurements within normal limits will improve Outcome: Progressing

## 2022-02-08 NOTE — Progress Notes (Signed)
OT Cancellation Note  Patient Details Name: Yvette Riley MRN: [id number] DOB: [birth year]   Cancelled Treatment:    Reason Eval/Treat Not Completed: Pt. screened, no needs identified, will sign off. Pt. Chart was thoroughly reviewed, and Pt. functional history interview was obtained. Pt. Reports being at her baseline functional ADL performance level, and at her baseline UE functioning level as prior to this admission. Pt. Reports a history of multiple strokes affecting the RUE, and reports no change since prior to this hospital admission. Pt. Reports residing at [facility], and plans to return to the facility upon discharge.  OT services are not  warranted at this time. Pt. is in agreement with his. Will complete the order, and sign off.   Olegario Messier, MS, OTR/L 02/08/2022, 2:43 PM

## 2022-02-08 NOTE — Evaluation (Signed)
Clinical/Bedside Swallow Evaluation Patient Details  Name: Yvette Riley MRN: [id number] Date of Birth: 09-Jul-1955  Today's Date: 02/08/2022 Time: SLP Start Time (ACUTE ONLY): 1250 SLP Stop Time (ACUTE ONLY): 1340 SLP Time Calculation (min) (ACUTE ONLY): 50 min  Past Medical History:  Past Medical History:  Diagnosis Date   Allergy    Anemia    Anxiety    Arthritis    Asthma    Blood transfusion without reported diagnosis    C. difficile diarrhea 04/07/2016   CAP (community acquired pneumonia) 03/21/2015   Combined forms of age-related cataract of both eyes 05/06/2016   COPD (chronic obstructive pulmonary disease) (HCC)    CVA (cerebral vascular accident) (HCC) 06/17/2018   Diabetes mellitus without complication (HCC)    Displacement of lumbar intervertebral disc without myelopathy    Emphysema of lung (HCC)    GERD (gastroesophageal reflux disease)    Glaucoma    History of chicken pox    Hyperlipidemia    Hypertension    Pneumonia 03/29/2015   Sepsis (HCC) 03/17/2016   Shortness of breath    Small cell lung cancer (HCC)    Past Surgical History:  Past Surgical History:  Procedure Laterality Date   CESAREAN SECTION     TUBAL LIGATION     UPPER GI ENDOSCOPY     HPI:  Pt is a 67 y.o. female with PMH CVA ~1 year ago with residual right-sided weakness on aspirin/Plavix, CAD, HLD, DM2, HTN, GERD, lung cancer in remission status post radiation/chemotherapy, asthma/COPD, anxiety/depression, chronic pain on long term opiates. Patient is a fair historian re: her GI/swallowing history. She reports history of difficulty Pills ~2-3 minths ago ("b/f Thanskgiving") and stated a Pills "got lodged in my Esophagus and dissolved away".  Since that time, she has not been eating much solid foods and has been crushing her Pills in applesauce. Increased discomfort attempting to eat any foods or keep down liquids or solids down for the last week+.   In the emergency room she had a CT scan of the neck  abdomen pelvis and head that showed soft tissue filling and expanding the partially visualized intrathoracic Esophagus appearance not substantially changed from CT scan of the chest in April 2023 but findings are incomplete - admitted to hospital for Endoscopy for further evaluation.   EGD on 02/08/22 --  Large Esophageal Ulcer in the middle third of Esophagus possibly due to pill impaction but no other significant findings.  Suspect globus sensation due to history of radiation.  GI cleared pt for Mech Soft w/ PPI 40 mg BID.   CXR at admit: Minimal left basilar atelectasis.    Assessment / Plan / Recommendation  Clinical Impression    Pt seen for BSE this afternoon. Min drowsy initially s/p returning from EGD -- GI notes read. Pt helped to sit upright in bed; verbal and followed all instructions. A/O x3; engaged easily and eager to have something to eat/drink -- "I don't eat meats and I swallow my Pills CRUSHED in applesauce".  On RA; afebrile, WBC not elevated.    OF NOTE: Pt does endorses s/s of Esophageal phase Dysmotility and REFLUX at home in recent months w/ difficulty clearing Pills and meats through the Esophagus. She stated a Pill has been "lodged" in her Esophagus and "damaged it" in recent weeks/months. See H&P and GI notes along w/ EGD notes above -- noted EGD revealed "Large Esophageal Ulcer in the middle third of Esophagus possibly due to pill impaction".  Pt appears to present w/ functional oropharyngeal phase swallowing w/ No overt sensorimotor deficits of swallowing appreciated during oral intake of trials. No neuromuscular swallowing deficits. Pt appears at reduced risk for aspiration from an oropharyngeal phase standpoint following general aspiration precautions.  HOWEVER, pt has a baseline presentation of GERD and Esophageal phase Dysmotility -- ANY Dysmotility or Regurgitation of Reflux material can increase risk for aspiration of the Reflux material during Retrograde flow thus impact  Voicing and Pulmonary status. Pt described issues of globus and stated she does not eat meats and Crushes her Pills in puree now. She is also Edentulous and requires Time to mash/gum solid foods -- poorly masticated solids may impact timely Esophageal phase Motility.     Pt sat upright in bed and consumed several trials of thin liquids Via Cup, purees, and broken-down solid foods w/ No overt clinical s/s of aspiration noted; clear vocal quality b/t trials, no decline in pulmonary status, no cough, no multiple swallows noted post initial pharyngeal swallow, no decline in O2 sats(97%). Oral phase appeared [facility] for bolus management and timely A-P transfer/clearing of material. Mastication of softened solids required mashing/gumming d/t Edentulous status. Functional; moistened foods given.  OM exam was [facility]; No unilateral weakness. Speech clear, intelligible.    Recommend a more Mech Soft diet consistency (broken-down, moistened foods for ease of chewing) w/ thin liquids. General aspiration precautions. Rest Breaks during meals/oral intake to allow for Esophageal clearing. REFLUX precautions strongly recommended to lessen chance for Regurgitation -- HOB elevated at night when sleeping.    Recommend pt continue f/u w/ GI for assessment/management of Reflux and tx as indicated. Dietician f/u. Discussion and handouts given on REFLUX, impact of REFLUX on swallowing. No further skilled ST services indicated. MD to reconsult ST services if any new needs while admitted. NSG updated. Pt appreciative of Education information. SLP Visit Diagnosis: Dysphagia, unspecified (R13.10) (Esophageal phase dysmotility baseline; GERD)    Aspiration Risk   (reduced following general precautions(Reflux and aspiration))    Diet Recommendation   a more Mech Soft diet consistency (broken-down, moistened foods) w/ thin liquids. General aspiration precautions. Rest Breaks during meals/oral intake to allow for Esophageal clearing. REFLUX  precautions strongly recommended to lessen chance for Regurgitation -- HOB elevated at night when sleeping.  Medication Administration: Crushed with puree (for Esophageal clearing/motility; or in liquid form)    Other  Recommendations Recommended Consults: Consider GI evaluation;Consider esophageal assessment (ongoing f/u for management) Oral Care Recommendations: Oral care BID;Oral care before and after PO;Patient independent with oral care Other Recommendations:  (n/a)    Recommendations for follow up therapy are one component of a multi-disciplinary discharge planning process, led by the attending physician.  Recommendations may be updated based on patient status, additional functional criteria and insurance authorization.  Follow up Recommendations No SLP follow up      Assistance Recommended at Discharge  PRN  Functional Status Assessment Patient has had a recent decline in their functional status and demonstrates the ability to make significant improvements in function in a reasonable and predictable amount of time.  Frequency and Duration  (n/a)   (n/a)       Prognosis Prognosis for Safe Diet Advancement: Fair (-Good) Barriers to Reach Goals: Time post onset;Severity of deficits Barriers/Prognosis Comment: Edentulous - extra time for mashing/gumming; Esophageal phase Dysmotility at baseline      Swallow Study   General Date of Onset: 02/07/22 HPI: Pt is a 67 y.o. female with PMH CVA ~1 year ago  with residual right-sided weakness on aspirin/Plavix, CAD, HLD, DM2, HTN, GERD, lung cancer in remission status post radiation/chemotherapy, asthma/COPD, anxiety/depression, chronic pain on long term opiates. Patient is a fair historian re: her GI/swallowing history. She reports history of difficulty Pills ~2-3 minths ago ("b/f Thanskgiving") and stated a Pills "got lodged in my Esophagus and dissolved away".  Since that time, she has not been eating much solid foods and has been crushing  her Pills in applesauce. Increased discomfort attempting to eat any foods or keep down liquids or solids down for the last week+.  In the emergency room she had a CT scan of the neck abdomen pelvis and head that showed soft tissue filling and expanding the partially visualized intrathoracic Esophagus appearance not substantially changed from CT scan of the chest in April 2023 but findings are incomplete - admitted to hospital for Endoscopy for further evaluation.   EGD on 02/08/22 --  Large Esophageal Ulcer in the middle third of Esophagus possibly due to pill impaction but no other significant findings.  Suspect globus sensation due to history of radiation.  GI cleared pt for Mech Soft w/ PPI 40 mg BID.   CXR at admit: Minimal left basilar atelectasis. Type of Study: Bedside Swallow Evaluation Previous Swallow Assessment: BSE in 2020 - mech soft/regular Diet Prior to this Study: NPO (Regular diet at home - no meats) Temperature Spikes Noted: No (wbc 8.7) Respiratory Status: Room air History of Recent Intubation: No Behavior/Cognition: Alert;Cooperative;Pleasant mood Oral Cavity Assessment: Within Functional Limits Oral Care Completed by SLP: Recent completion by staff Oral Cavity - Dentition: Edentulous (does not wear her dentures) Vision: Functional for self-feeding Self-Feeding Abilities: Able to feed self;Needs assist;Needs set up Patient Positioning: Upright in bed (supported positioning) Baseline Vocal Quality: Normal Volitional Cough: Strong Volitional Swallow: Able to elicit    Oral/Motor/Sensory Function Overall Oral Motor/Sensory Function: Within functional limits   Ice Chips Ice chips: Within functional limits Presentation: Spoon (fed; 2 trials)   Thin Liquid Thin Liquid: Within functional limits Presentation: Cup;Self Fed (~5-6 ozs)    Nectar Thick Nectar Thick Liquid: Not tested   Honey Thick Honey Thick Liquid: Not tested   Puree Puree: Within functional limits Presentation:  Self Fed;Spoon (10+ trials)   Solid     Solid: Within functional limits (Edentulous - extra time for mashing/gumming) Presentation: Self Fed;Spoon (8 trials) Other Comments: moistened foods        Jerilynn Som, MS, CCC-SLP Speech Language Pathologist Rehab Services; [facility] [phone] (ascom) Michaline Kindig 02/08/2022,5:51 PM

## 2022-02-08 NOTE — Progress Notes (Addendum)
PROGRESS NOTE    Yvette Riley   SUP:[id number] DOB: 1955/01/30  DOA: 02/07/2022 Date of Service: 02/08/22 PCP: Malva Limes, MD     Brief Narrative / Hospital Course:  Yvette Riley is a 67 y.o. female with PMH CVA with residual right-sided weakness on aspirin/Plavix, CAD, HLD, DM2, HTN, GERD, lung cancer in remission status post radiation/chemotherapy, asthma/COPD, anxiety/depression, chronic pain on long term opiates. Patient is a poor historian. She reports history of dysphagia/difficulty swallowing, unclear timing but she states at least for a couple of weeks, worse over the past week or so to the point where she is unable to keep down liquids or solids, cannot recall last solid meal she had.  In the emergency room she had a CT scan of the neck abdomen pelvis and head that showed soft tissue filling and expanding the partially visualized intrathoracic esophagus appearance not substantially changed from CT scan of the chest in April 2023 but findings are incomplete - admitted to hospital for endoscopy for further evaluation.  EGD here 02/08/22 -large esophageal ulcer in the middle third of esophagus possibly due to pill impaction but no other significant findings.  Suspect globus sensation due to history of radiation.  SLP cleared.  Advancing diet.  If tolerating p.o., patient is advised that she will be discharged early tomorrow morning plan to proceed with a mechanical soft diet for 4 to 6 weeks with Prilosec 40 mg twice daily for 8 weeks and Carafate 4 times daily for 8 weeks she will need repeat EGD with her primary GI at [facility] in 6 to 8 weeks to confirm that the ulcer is healed.   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)   Vertebral artery stenosis, right   SIRS (systemic inflammatory response syndrome) (HCC)   Nausea & vomiting   Dysphagia with regurgitation solids/liquids Esophageal ulcer Globus sensation likely due to prior radiation Likely related to prior radiation damage to esophagus given hxd lung cancer treatment GI consulted S/p EGD today, see above  Advancing diet    SIRS (systemic inflammatory response syndrome) (HCC) -resolved as WBC have trended to normal limits Likely due to dehydration  IV fluids, normal saline No obvious source infection, patient is not symptomatic for colitis or pneumonia despite imaging findings, will hold off on antibiotics at this time, no sepsis suspected at this time but will monitor closely   Hypokalemia Replacing BMP in am  COPD (chronic obstructive pulmonary disease) (HCC) Smoking greater than 30 pack years -patient quit when diagnosed with lung cancer Hx Small cell lung cancer, right (HCC) Radiation sequela likely causing esophageal abnormality/dysphagia DuoNebs as needed resume montelukast   Coronary atherosclerosis Essential hypertension Tachycardia initially thought dehydration, now improved likely still present due to beta-blocker withdrawal resume beta-blocker, aspirin, ACE, fibrate (statin intolerant) Hydralazine as needed elevated blood pressure   History of cerebellar CVA with residual deficit 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  Statin intolerant Right vertebral artery stenosis outpatient follow-up, no new CVA symptoms   Type 2 diabetes mellitus (  HCC) A1c   DVT prophylaxis: SCD Pertinent IV fluids/nutrition: advancing diet Central lines / invasive devices: none  Code Status: FULL CODE  Current Admission Status: inpatient  TOC needs / Dispo  plan: none Barriers to discharge / significant pending items: advancing diet and ensuring can tolerate pills, anticipate d/c home early AM tomorrow             Subjective / Brief ROS:  Patient reports still having feeling of getting choked but is overall better and able to swallow solids now  Denies CP/SOB.  Pain controlled.  Denies new weakness.  Tolerating diet.  Reports no concerns w/ urination/defecation.   Family Communication: will try to call brother again this afternoon     Objective Findings:  Vitals:   02/08/22 1117 02/08/22 1120 02/08/22 1139 02/08/22 1229  BP: 120/87 121/82 123/84   Pulse: (!) 129  (!) 101 (!) 104  Resp: 20 16 12 16   Temp: (!) 96.8 F (36 C)   (!) 97.4 F (36.3 C)  TempSrc: Temporal   Oral  SpO2: 93% 93% 98% 99%  Weight:      Height:        Intake/Output Summary (Last 24 hours) at 02/08/2022 1550 Last data filed at 02/08/2022 1113 Gross per 24 hour  Intake 1752.23 ml  Output --  Net 1752.23 ml   Filed Weights   02/08/22 0105  Weight: 69.2 kg    Examination:  Physical Exam Constitutional:      General: She is not in acute distress.    Appearance: Normal appearance. She is not ill-appearing.  HENT:     Mouth/Throat:     Mouth: Mucous membranes are dry.  Cardiovascular:     Rate and Rhythm: Regular rhythm. Tachycardia present.     Heart sounds: No murmur heard. Pulmonary:     Effort: Pulmonary effort is normal.     Breath sounds: Normal breath sounds.  Abdominal:     General: Abdomen is flat. Bowel sounds are normal.     Palpations: Abdomen is soft.  Neurological:     Mental Status: She is alert.          Scheduled Medications:   clopidogrel  75 mg Oral Daily   cyanocobalamin  1,000 mcg Oral Daily   feeding supplement (GLUCERNA SHAKE)  237 mL Oral TID BM   gabapentin  300 mg Oral BID   metoprolol succinate  25 mg Oral Daily   pantoprazole  40 mg Oral Daily   senna  2 tablet Oral Daily   sertraline  50 mg  Oral Daily   [START ON 02/09/2022] umeclidinium-vilanterol  1 puff Inhalation Q0600    Continuous Infusions:  sodium chloride 100 mL/hr at 02/08/22 0550    PRN Medications:  albuterol, hydrALAZINE, ketorolac, ondansetron, polyethylene glycol, sodium phosphate  Antimicrobials from admission:  Anti-infectives (From admission, onward)    None           Data Reviewed:  I have personally reviewed the following...  CBC: Recent Labs  Lab 02/07/22 1334 02/08/22 0400  WBC 12.0* 8.7  NEUTROABS 10.1*  --   HGB 13.8 12.1  HCT 43.7 38.6  MCV 89.9 90.8  PLT 259 196   Basic Metabolic Panel: Recent Labs  Lab 02/07/22 1334 02/08/22 0400  NA 140 143  K 3.6 3.0*  CL 101 109  CO2 24 23  GLUCOSE 138* 113*  BUN 19 19  CREATININE 0.66 0.64  CALCIUM 9.4 8.3*   GFR: Estimated Creatinine  Clearance: 56.2 mL/min (by C-G formula based on SCr of 0.64 mg/dL). Liver Function Tests: Recent Labs  Lab 02/07/22 1334 02/08/22 0400  AST 23 15  ALT 12 11  ALKPHOS 69 61  BILITOT 1.9* 1.4*  PROT 8.3* 7.3  ALBUMIN 5.0 4.2   No results for input(s): "LIPASE", "AMYLASE" in the last 168 hours. No results for input(s): "AMMONIA" in the last 168 hours. Coagulation Profile: No results for input(s): "INR", "PROTIME" in the last 168 hours. Cardiac Enzymes: No results for input(s): "CKTOTAL", "CKMB", "CKMBINDEX", "TROPONINI" in the last 168 hours. BNP (last 3 results) No results for input(s): "PROBNP" in the last 8760 hours. HbA1C: No results for input(s): "HGBA1C" in the last 72 hours. CBG: No results for input(s): "GLUCAP" in the last 168 hours. Lipid Profile: No results for input(s): "CHOL", "HDL", "LDLCALC", "TRIG", "CHOLHDL", "LDLDIRECT" in the last 72 hours. Thyroid Function Tests: No results for input(s): "TSH", "T4TOTAL", "FREET4", "T3FREE", "THYROIDAB" in the last 72 hours. Anemia Panel: No results for input(s): "VITAMINB12", "FOLATE", "FERRITIN", "TIBC", "IRON", "RETICCTPCT"  in the last 72 hours. Most Recent Urinalysis On File:     Component Value Date/Time   COLORURINE YELLOW 12/17/2019 0900   APPEARANCEUR HAZY (A) 12/17/2019 0900   LABSPEC 1.020 12/17/2019 0900   PHURINE 5.0 12/17/2019 0900   GLUCOSEU NEGATIVE 12/17/2019 0900   HGBUR NEGATIVE 12/17/2019 0900   BILIRUBINUR Negative 04/23/2021 1043   KETONESUR TRACE (A) 12/17/2019 0900   PROTEINUR Negative 04/23/2021 1043   PROTEINUR NEGATIVE 12/17/2019 0900   UROBILINOGEN 0.2 04/23/2021 1043   NITRITE Positive 04/23/2021 1043   NITRITE POSITIVE (A) 12/17/2019 0900   LEUKOCYTESUR Large (3+) (A) 04/23/2021 1043   LEUKOCYTESUR SMALL (A) 12/17/2019 0900   Sepsis Labs: @LABRCNTIP (procalcitonin:4,lacticidven:4) Microbiology: Recent Results (from the past 240 hour(s))  Resp panel by RT-PCR (RSV, Flu A&B, Covid) Anterior Nasal Swab     Status: None   Collection Time: 02/07/22  4:43 PM   Specimen: Anterior Nasal Swab  Result Value Ref Range Status   SARS Coronavirus 2 by RT PCR NEGATIVE NEGATIVE Final    Comment: (NOTE) SARS-CoV-2 target nucleic acids are NOT DETECTED.  The SARS-CoV-2 RNA is generally detectable in upper respiratory specimens during the acute phase of infection. The lowest concentration of SARS-CoV-2 viral copies this assay can detect is 138 copies/mL. A negative result does not preclude SARS-Cov-2 infection and should not be used as the sole basis for treatment or other patient management decisions. A negative result may occur with  improper specimen collection/handling, submission of specimen other than nasopharyngeal swab, presence of viral mutation(s) within the areas targeted by this assay, and inadequate number of viral copies(<138 copies/mL). A negative result must be combined with clinical observations, patient history, and epidemiological information. The expected result is Negative.  Fact Sheet for Patients:  EntrepreneurPulse.com.au  Fact Sheet for  Healthcare Providers:  IncredibleEmployment.be  This test is no t yet approved or cleared by the [place] FDA and  has been authorized for detection and/or diagnosis of SARS-CoV-2 by FDA under an Emergency Use Authorization (EUA). This EUA will remain  in effect (meaning this test can be used) for the duration of the COVID-19 declaration under Section 564(b)(1) of the Act, 21 U.S.C.section 360bbb-3(b)(1), unless the authorization is terminated  or revoked sooner.       Influenza A by PCR NEGATIVE NEGATIVE Final   Influenza B by PCR NEGATIVE NEGATIVE Final    Comment: (NOTE) The Xpert Xpress SARS-CoV-2/FLU/RSV plus assay is intended  as an aid in the diagnosis of influenza from Nasopharyngeal swab specimens and should not be used as a sole basis for treatment. Nasal washings and aspirates are unacceptable for Xpert Xpress SARS-CoV-2/FLU/RSV testing.  Fact Sheet for Patients: BloggerCourse.com  Fact Sheet for Healthcare Providers: SeriousBroker.it  This test is not yet approved or cleared by the [place] FDA and has been authorized for detection and/or diagnosis of SARS-CoV-2 by FDA under an Emergency Use Authorization (EUA). This EUA will remain in effect (meaning this test can be used) for the duration of the COVID-19 declaration under Section 564(b)(1) of the Act, 21 U.S.C. section 360bbb-3(b)(1), unless the authorization is terminated or revoked.     Resp Syncytial Virus by PCR NEGATIVE NEGATIVE Final    Comment: (NOTE) Fact Sheet for Patients: BloggerCourse.com  Fact Sheet for Healthcare Providers: SeriousBroker.it  This test is not yet approved or cleared by the [place] FDA and has been authorized for detection and/or diagnosis of SARS-CoV-2 by FDA under an Emergency Use Authorization (EUA). This EUA will remain in effect (meaning this  test can be used) for the duration of the COVID-19 declaration under Section 564(b)(1) of the Act, 21 U.S.C. section 360bbb-3(b)(1), unless the authorization is terminated or revoked.  Performed at [facility], [address] [place], [place] [address]   MRSA Next Gen by PCR, Nasal     Status: None   Collection Time: 02/08/22  1:54 AM   Specimen: Nasal Mucosa; Nasal Swab  Result Value Ref Range Status   MRSA by PCR Next Gen NOT DETECTED NOT DETECTED Final    Comment: (NOTE) The GeneXpert MRSA Assay (FDA approved for NASAL specimens only), is one component of a comprehensive MRSA colonization surveillance program. It is not intended to diagnose MRSA infection nor to guide or monitor treatment for MRSA infections. Test performance is not FDA approved in patients less than 51 years old. Performed at [facility], [address], [place], [place] [address]       Radiology Studies last 3 days: CT HEAD WO CONTRAST ( )  Result Date: 02/07/2022 CLINICAL DATA:  New onset headache. Difficulty swallowing and nausea. EXAM: CT HEAD WITHOUT CONTRAST TECHNIQUE: Contiguous axial images were obtained from the base of the skull through the vertex without intravenous contrast. RADIATION DOSE REDUCTION: This exam was performed according to the departmental dose-optimization program which includes automated exposure control, adjustment of the mA and/or kV according to patient size and/or use of iterative reconstruction technique. COMPARISON:  05/09/2021 FINDINGS: Brain: Remote right cerebellar stroke. Small remote right occipital lobe stroke. Periventricular white matter and corona radiata hypodensities favor chronic ischemic microvascular white matter disease. Otherwise, the brainstem, cerebellum, cerebral peduncles, thalamus, basal ganglia, basilar cisterns, and ventricular system appear within normal limits. No intracranial hemorrhage, mass lesion, or acute CVA. Vascular:  Unremarkable Skull: Unremarkable Sinuses/Orbits: Unremarkable Other: No supplemental non-categorized findings. IMPRESSION: 1. No acute intracranial findings. 2. Remote right cerebellar and right occipital lobe infarcts. 3. Periventricular white matter and corona radiata hypodensities favor chronic ischemic microvascular white matter disease. Electronically Signed   By: Gaylyn Rong M.D.   On: 02/07/2022 17:07   CT Soft Tissue Neck W Contrast  Result Date: 02/07/2022 CLINICAL DATA:  Soft tissue infection suspected, neck, xray done EXAM: CT NECK WITH CONTRAST TECHNIQUE: Multidetector CT imaging of the neck was performed using the standard protocol following the bolus administration of intravenous contrast. RADIATION DOSE REDUCTION: This exam was performed according to the departmental dose-optimization program which includes  automated exposure control, adjustment of the mA and/or kV according to patient size and/or use of iterative reconstruction technique. CONTRAST:  OMNIPAQUE IOHEXOL 300 MG/ML  SOLN COMPARISON:  None Available. FINDINGS: Pharynx and larynx: Mildly prominent tonsillar tissue without discrete mass. Unremarkable larynx. Salivary glands: No inflammation, mass, or stone. Thyroid: Normal. Lymph nodes: None enlarged or abnormal density. Vascular: Suspected severe stenosis of the right V2 vertebral artery due to osteophytes. Limited intracranial: Better evaluated on same day CT head. Visualized orbits: Negative. Mastoids and visualized paranasal sinuses: Clear. Skeleton: Multilevel degenerative change. No acute findings on limited assessment. Upper chest: Soft tissue filling and expanding the partially visualized intrathoracic esophagus. Partially imaged treatment change in the right perihilar region. IMPRESSION: 1. Soft tissue filling and expanding the partially visualized intrathoracic esophagus. The appearance is not substantially changed relative to CT of the chest from April 19, 23, but  findings are incompletely assessed/imaged. Consider further evaluation with endoscopy to evaluate for stricture and/or malignancy. 2. Partially imaged treatment change in the right perihilar region. CT of the chest could further characterize. 3. Suspected severe stenosis of the right V2 vertebral artery due to osteophytes. A CTA of the neck could further evaluate if clinically warranted. Electronically Signed   By: Feliberto Harts M.D.   On: 02/07/2022 16:57   CT ABDOMEN PELVIS W CONTRAST  Result Date: 02/07/2022 CLINICAL DATA:  Nausea and abdominal pain. EXAM: CT ABDOMEN AND PELVIS WITH CONTRAST TECHNIQUE: Multidetector CT imaging of the abdomen and pelvis was performed using the standard protocol following bolus administration of intravenous contrast. RADIATION DOSE REDUCTION: This exam was performed according to the departmental dose-optimization program which includes automated exposure control, adjustment of the mA and/or kV according to patient size and/or use of iterative reconstruction technique. CONTRAST:  OMNIPAQUE IOHEXOL 300 MG/ML  SOLN COMPARISON:  CT abdomen and pelvis 05/16/2021 FINDINGS: Lower chest: Are ill-defined patchy nodular ground-glass and airspace opacities in both lung bases, new from prior. Hepatobiliary: No focal liver abnormality is seen. No gallstones, gallbladder wall thickening, or biliary dilatation. Pancreas: Unremarkable. No pancreatic ductal dilatation or surrounding inflammatory changes. Spleen: Normal in size without focal abnormality. Adrenals/Urinary Tract: Adrenal glands are unremarkable. Kidneys are normal, without renal calculi, focal lesion, or hydronephrosis. Bladder is unremarkable. Stomach/Bowel: There is mild diffuse colonic wall thickening versus normal under distension. There is no surrounding inflammation. The appendix, stomach and small bowel appear within normal limits. There are few scattered colonic diverticula. No dilated bowel loops. No free air.  Vascular/Lymphatic: Aortic atherosclerosis. No enlarged abdominal or pelvic lymph nodes. Reproductive: Uterus and bilateral adnexa are unremarkable. Other: No abdominal wall hernia or abnormality. No abdominopelvic ascites. Musculoskeletal: Mild chronic compression deformity of L1 is unchanged. No acute fractures are seen. IMPRESSION: 1. Mild diffuse colonic wall thickening versus normal under distension. Correlate clinically for colitis. 2. Patchy nodular ground-glass and airspace opacities in both lung bases, new from prior, likely infectious/inflammatory. Aortic Atherosclerosis (ICD10-I70.0). Electronically Signed   By: Darliss Cheney M.D.   On: 02/07/2022 16:52   DG Chest 2 View  Result Date: 02/07/2022 CLINICAL DATA:  Tachycardia EXAM: CHEST - 2 VIEW COMPARISON:  05/16/2021, 09/06/2021 FINDINGS: Stable cardiomediastinal contours. Post treatment changes with scarring in the right perihilar region with associated volume loss. Minimal left basilar atelectasis. Lungs are otherwise clear. No pleural effusion or pneumothorax. IMPRESSION: Minimal left basilar atelectasis. Electronically Signed   By: Duanne Guess D.O.   On: 02/07/2022 14:53  LOS: 1 day     Sunnie Nielsen, DO [facility] 02/08/2022, 3:50 PM    Dictation software may have been used to generate the above note. Typos may occur and escape review in typed/dictated notes. Please contact Dr Lyn Hollingshead directly for clarity if needed.  Staff may message me via secure chat in Epic  but this may not receive an immediate response,  please page me for urgent matters!  If 7PM-7AM, please contact night coverage [email or web address]

## 2022-02-08 NOTE — Hospital Course (Addendum)
Yvette Riley is a 67 y.o. female with PMH CVA with residual right-sided weakness on aspirin/Plavix, CAD, HLD, DM2, HTN, GERD, lung cancer in remission status post radiation/chemotherapy, asthma/COPD, anxiety/depression, chronic pain on long term opiates. Patient is a poor historian. She reports history of dysphagia/difficulty swallowing, unclear timing but she states at least for a couple of weeks, worse over the past week or so to the point where she is unable to keep down liquids or solids, cannot recall last solid meal she had.  In the emergency room she had a CT scan of the neck abdomen pelvis and head that showed soft tissue filling and expanding the partially visualized intrathoracic esophagus appearance not substantially changed from CT scan of the chest in April 2023 but findings are incomplete - admitted to hospital for endoscopy for further evaluation.  EGD here 02/08/22 -large esophageal ulcer in the middle third of esophagus possibly due to pill impaction but no other significant findings.  Suspect globus sensation due to history of radiation.  SLP cleared.  Advancing diet.  If tolerating p.o., patient is advised that she will be discharged early tomorrow morning plan to proceed with a mechanical soft diet for 4 to 6 weeks with Prilosec 40 mg twice daily for 8 weeks and Carafate 4 times daily for 8 weeks she will need repeat EGD with her primary GI at [facility] in 6 to 8 weeks to confirm that the ulcer is healed.   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    SIRS (systemic inflammatory response syndrome) (HCC) -resolved  Likely due to dehydration  Tachycardia likely rebound due to discontinuation of beta-blocker, resolved with restarting beta-blocker Sepsis r/o Monitor VS   Hypokalemia - resolved Follow outpatient   COPD (chronic obstructive pulmonary disease) (HCC) Smoking greater than 30 pack years -patient quit when diagnosed with lung cancer Hx Small cell lung cancer, right (HCC) Radiation sequela likely causing esophageal abnormality/dysphagia DuoNebs as needed resume montelukast   Coronary atherosclerosis Essential hypertension Tachycardia initially thought dehydration, improved likely was still present due to beta-blocker withdrawal - resolved back on beta blocker Continue home meds w/ beta-blocker, aspirin, ACE, fibrate (statin intolerant)   History of cerebellar CVA with residual deficit 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  Statin intolerant Right vertebral artery stenosis outpatient follow-up, no new CVA symptoms   Type 2 diabetes mellitus (HCC) A1c   DVT prophylaxis: SCD Pertinent IV fluids/nutrition: advancing diet Central  lines / invasive devices: none  Code Status: FULL CODE  Current Admission Status: inpatient  TOC needs / Dispo plan: none Barriers to discharge / significant pending items: advancing diet and ensuring can tolerate pills,  anticipate d/c home early AM tomorrow

## 2022-02-08 NOTE — NC FL2 (Signed)
[place] LEVEL OF CARE FORM     IDENTIFICATION  Patient Name: Yvette Riley Birthdate: [birth year] Sex: female Admission Date (Current Location): 02/07/2022  [facility] and [place] Number:  Engineering geologist and Address:  [facility], [address], [place], [place] [address]      Provider Number: [id number]  Attending Physician Name and Address:  Emeterio Reeve, DO  Relative Name and Phone Number:  [name] (Brother) [phone] [facility])    Current Level of Care: Hospital Recommended Level of Care: [place] Prior Approval Number:    Date Approved/Denied:   PASRR Number: [id number]  Discharge Plan:      Current Diagnoses: Patient Active Problem List   Diagnosis Date Noted   Dysphagia 02/07/2022   Vertebral artery stenosis, right 02/07/2022   SIRS (systemic inflammatory response syndrome) ([place]) 02/07/2022   Nausea & vomiting 02/07/2022   Abnormal red cell volume 05/16/2021   B12 deficiency 01/26/2021   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) 05/18/2019   History of COPD 05/18/2019   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   Chronic, continuous use of opioids 08/05/2018   History of cerebellar CVA with residual deficit 06/17/2018   Chronic cough 06/02/2018   Anxiety 07/19/2016   Small cell lung cancer, right ([place]) 05/13/2016   Bilateral presbyopia 05/06/2016   Combined forms of age-related cataract of both eyes 05/06/2016   Dry eye syndrome of both lacrimal glands 05/06/2016   Chemoprophylaxis 04/29/2016   Severe obesity (BMI 35.0-39.9) with comorbidity ([place]) 04/16/2016   Weakness generalized 04/06/2016   Mass of upper lobe of right lung 03/01/2016   Coronary atherosclerosis 02/29/2016   Allergic rhinitis 09/27/2014   Asthma 09/27/2014   Anemia 09/27/2014   Hyperglyceridemia, pure 09/27/2014   Glaucoma 09/27/2014   Hip pain 09/27/2014   Right knee pain 09/27/2014    Displacement of lumbar intervertebral disc without myelopathy 09/27/2014   Thoracic back pain 09/27/2014   Insomnia 08/11/2014   COPD (chronic obstructive pulmonary disease) ([place]) 06/12/2014   Type 2 diabetes mellitus ([place]) 06/12/2014   Back pain 06/12/2014   Smoking greater than 30 pack years 03/28/2014    Orientation RESPIRATION BLADDER Height & Weight        Normal   Weight: 152 lb 8.9 oz (69.2 kg) Height:  4\' 9"  (144.8 cm)  BEHAVIORAL SYMPTOMS/MOOD NEUROLOGICAL BOWEL NUTRITION STATUS        Diet  AMBULATORY STATUS COMMUNICATION OF NEEDS Skin     Verbally Other (Comment) (rash abdomen)                       Personal Care Assistance Level of Assistance  Bathing, Feeding, Dressing           Functional Limitations Info             SPECIAL CARE FACTORS FREQUENCY                       Contractures      Additional Factors Info  Code Status, Allergies Code Status Info: full Allergies Info: Allergies: Other, Penicillins, Yellow Jacket Venom (Bee Venom), Codeine, Crestor (Rosuvastatin), Gemfibrozil, Trazodone And Nefazodone, Lipitor (Atorvastatin)           Current Medications (02/08/2022):  This is the current hospital active medication list Current Facility-Administered Medications  Medication Dose Route Frequency Provider Last Rate Last Admin   0.9 %  sodium chloride infusion   Intravenous Continuous  Sunnie Nielsen, DO 100 mL/hr at 02/08/22 0550 New Bag at 02/08/22 0550   hydrALAZINE (APRESOLINE) injection 5 mg  5 mg Intravenous Q6H PRN Sunnie Nielsen, DO       ketorolac (TORADOL) 15 MG/ML injection 15 mg  15 mg Intravenous Q6H PRN Sunnie Nielsen, DO   15 mg at 02/08/22 3810   potassium chloride 10 mEq in 100 mL IVPB  10 mEq Intravenous Q1 Hr x 4 Sunnie Nielsen, DO       sodium phosphate (FLEET) 7-19 GM/118ML enema 1 enema  1 enema Rectal Once PRN Sunnie Nielsen, DO         Discharge Medications: Please see discharge summary for  a list of discharge medications.  Relevant Imaging Results:  Relevant Lab Results:   Additional Information SS #: [id number]  Lucella Pommier E Brynley Cuddeback, LCSW

## 2022-02-08 NOTE — Anesthesia Preprocedure Evaluation (Addendum)
Anesthesia Evaluation  Patient identified by MRN, date of birth, ID band Patient awake    Reviewed: Allergy & Precautions, NPO status , Patient's Chart, lab work & pertinent test results  Airway Mallampati: III  TM Distance: >3 FB Neck ROM: full    Dental  (+) Edentulous Lower, Edentulous Upper   Pulmonary shortness of breath, asthma , COPD, former smoker lung cancer in remission status post radiation/chemotherapy   Pulmonary exam normal        Cardiovascular hypertension, + CAD  Normal cardiovascular exam     Neuro/Psych  PSYCHIATRIC DISORDERS Anxiety Depression    Chronic pain on chronic opiate therapy  CVA, Residual Symptoms    GI/Hepatic Neg liver ROS,GERD  Medicated,,  Endo/Other  diabetes    Renal/GU negative Renal ROS  negative genitourinary   Musculoskeletal   Abdominal   Peds  Hematology  (+) Blood dyscrasia, anemia   Anesthesia Other Findings Past Medical History: No date: Allergy No date: Anemia No date: Anxiety No date: Arthritis No date: Asthma No date: Blood transfusion without reported diagnosis 04/07/2016: C. difficile diarrhea 03/21/2015: CAP (community acquired pneumonia) 05/06/2016: Combined forms of age-related cataract of both eyes No date: COPD (chronic obstructive pulmonary disease) (HCC) 06/17/2018: CVA (cerebral vascular accident) (HCC) No date: Diabetes mellitus without complication (HCC) No date: Displacement of lumbar intervertebral disc without myelopathy No date: Emphysema of lung (HCC) No date: GERD (gastroesophageal reflux disease) No date: Glaucoma No date: History of chicken pox No date: Hyperlipidemia No date: Hypertension 03/29/2015: Pneumonia 03/17/2016: Sepsis (HCC) No date: Shortness of breath No date: Small cell lung cancer (HCC)  Past Surgical History: No date: CESAREAN SECTION No date: TUBAL LIGATION No date: UPPER GI ENDOSCOPY  BMI    Body Mass Index: 33.01  kg/m      Reproductive/Obstetrics negative OB ROS                             Anesthesia Physical Anesthesia Plan  ASA: 3  Anesthesia Plan: General   Post-op Pain Management: Minimal or no pain anticipated   Induction: Intravenous  PONV Risk Score and Plan: Propofol infusion and TIVA  Airway Management Planned: Natural Airway and Nasal Cannula  Additional Equipment:   Intra-op Plan:   Post-operative Plan:   Informed Consent: I have reviewed the patients History and Physical, chart, labs and discussed the procedure including the risks, benefits and alternatives for the proposed anesthesia with the patient or authorized representative who has indicated his/her understanding and acceptance.     Dental Advisory Given  Plan Discussed with: Anesthesiologist, CRNA and Surgeon  Anesthesia Plan Comments: (Patient consented for risks of anesthesia including but not limited to:  - adverse reactions to medications - risk of airway placement if required - damage to eyes, teeth, lips or other oral mucosa - nerve damage due to positioning  - sore throat or hoarseness - Damage to heart, brain, nerves, lungs, other parts of body or loss of life  Patient voiced understanding.)        Anesthesia Quick Evaluation

## 2022-02-08 NOTE — Anesthesia Postprocedure Evaluation (Signed)
Anesthesia Post Note  Patient: Yvette Riley  Procedure(s) Performed: ESOPHAGOGASTRODUODENOSCOPY (EGD) WITH PROPOFOL  Patient location during evaluation: Endoscopy Anesthesia Type: General Level of consciousness: awake and alert Pain management: pain level controlled Vital Signs Assessment: post-procedure vital signs reviewed and stable Respiratory status: spontaneous breathing, nonlabored ventilation, respiratory function stable and patient connected to nasal cannula oxygen Cardiovascular status: blood pressure returned to baseline and stable Postop Assessment: no apparent nausea or vomiting Anesthetic complications: no  No notable events documented.   Last Vitals:  Vitals:   02/08/22 1117 02/08/22 1120  BP: 120/87 121/82  Pulse: (!) 129   Resp: 20 16  Temp: (!) 36 C   SpO2: 93% 93%    Last Pain:  Vitals:   02/08/22 1117  TempSrc: Temporal  PainSc: Asleep                 Louie Boston

## 2022-02-08 NOTE — Consult Note (Signed)
Wyline Mood , MD [address], [place], [place], [address] [address], [place], [place], [address] Phone: [phone]  Fax: [phone]  Consultation  Referring Provider: Dr. Lyn Hollingshead Primary Care Physician:  Malva Limes, MD Primary Gastroenterologist: [facility]     Reason for Consultation:   Dysphagia  Date of Admission:  02/07/2022 Date of Consultation:  02/08/2022         HPI:   Yvette Riley is a 67 y.o. female who is known to [facility] in fact had an upper endoscopy in November 2023 for dysphagia where no abnormalities were seen and the plan was for outpatient esophageal manometry.  She has a history of CVA CAD on aspirin Plavix hypertension diabetes lung cancer in remission status postradiation chemotherapy asthma COPD present to the emergency room with difficulty swallowing going on for at least a couple of weeks.  In the emergency room she had a CT scan of the neck abdomen pelvis and head that showed soft tissue filling and expanding the partially visualized intrathoracic esophagus appearance not substantially changed from CT scan of the chest in April 2023 but findings are incomplete assessed consider endoscopy for further evaluation.  She says difficulty in swallowing is not something new this has been going on for a few months affect solids and liquids both.  Points to her neck where the food gets stuck.  Past Medical History:  Diagnosis Date   Allergy    Anemia    Anxiety    Arthritis    Asthma    Blood transfusion without reported diagnosis    C. difficile diarrhea 04/07/2016   CAP (community acquired pneumonia) 03/21/2015   Combined forms of age-related cataract of both eyes 05/06/2016   COPD (chronic obstructive pulmonary disease) (HCC)    CVA (cerebral vascular accident) (HCC) 06/17/2018   Diabetes mellitus without complication (HCC)    Displacement of lumbar intervertebral disc without myelopathy    Emphysema of lung (HCC)    GERD  (gastroesophageal reflux disease)    Glaucoma    History of chicken pox    Hyperlipidemia    Hypertension    Pneumonia 03/29/2015   Sepsis (HCC) 03/17/2016   Shortness of breath    Small cell lung cancer (HCC)     Past Surgical History:  Procedure Laterality Date   CESAREAN SECTION     TUBAL LIGATION     UPPER GI ENDOSCOPY      Prior to Admission medications   Medication Sig Start Date End Date Taking? Authorizing Provider  clopidogrel (PLAVIX) 75 MG tablet Take 1 tablet (75 mg total) by mouth daily. 04/23/21  Yes Malva Limes, MD  gabapentin (NEURONTIN) 300 MG capsule Take 300 mg by mouth 2 (two) times daily.   Yes [provider]  HUMALOG 100 UNIT/ML injection Inject 2-12 Units into the skin 3 (three) times daily with meals. 200-249: 2u 250-299: 4u 300-349: 6u 350-399: 8u 400-449: 10u 450-499: 12u 12/27/21  Yes [provider]  metFORMIN (GLUCOPHAGE) 500 MG tablet Take 500 mg by mouth 2 (two) times daily with a meal.   Yes [provider]  NYSTATIN powder Apply 1 Application topically every 12 (twelve) hours as needed (for fungal rash).   Yes [provider]  ondansetron (ZOFRAN) 4 MG tablet Take 4 mg by mouth every 6 (six) hours as needed for nausea. 02/05/22  Yes [provider]  pantoprazole (PROTONIX) 40 MG tablet Take 40 mg by mouth  daily.   Yes [provider]  polyethylene glycol (MIRALAX / GLYCOLAX) 17 g packet Take 17 g by mouth daily as needed for mild constipation.   Yes [provider]  senna (SENOKOT) 8.6 MG TABS tablet Take 2 tablets by mouth daily. 01/31/22  Yes [provider]  albuterol (PROVENTIL HFA) 108 (90 Base) MCG/ACT inhaler Inhale 2 puffs into the lungs every 6 (six) hours as needed for wheezing or shortness of breath. 06/12/21   Malva Limes, MD  diclofenac Sodium (VOLTAREN) 1 % GEL Apply 4 g topically 4 (four) times daily. Patient taking differently: Apply 4 g topically 4 (four)  times daily as needed (pain). to lower back 06/19/21   Malva Limes, MD  meloxicam (MOBIC) 15 MG tablet TAKE 1 TABLET BY MOUTH ONCE DAILY AS NEEDED FOR PAIN 05/08/21   Malva Limes, MD  metoprolol succinate (TOPROL-XL) 25 MG 24 hr tablet Take 1 tablet (25 mg total) by mouth daily. 06/15/21   Malva Limes, MD  oxyCODONE-acetaminophen (PERCOCET) 10-325 MG tablet TAKE ONE TABLET BY MOUTH EVERY 4 HOURS AS NEEDED (VIAL) 11/28/21   Malva Limes, MD  sertraline (ZOLOFT) 50 MG tablet Take 1 tablet (50 mg total) by mouth daily. 06/15/21   Malva Limes, MD  umeclidinium-vilanterol [facility] ELLIPTA) 62.5-25 MCG/ACT AEPB Inhale 1 puff into the lungs daily at 6 (six) AM. 06/15/21   Fisher, Demetrios Isaacs, MD  vitamin B-12 (CYANOCOBALAMIN) 1000 MCG tablet Take 1 tablet (1,000 mcg total) by mouth daily. 05/23/21   Debera Lat, PA-C    Family History  Problem Relation Age of Onset   Heart failure Mother    Heart disease Mother    Stroke Mother    Heart disease Brother    COPD Brother    Cancer Maternal Aunt        Breast Cancer     Social History   Tobacco Use   Smoking status: Former    Packs/day: 0.25    Years: 45.00    Total pack years: 11.25    Types: Cigarettes    Quit date: 03/17/2016    Years since quitting: 5.9   Smokeless tobacco: Never   Tobacco comments:    pt states she quit in 2016-2017  Vaping Use   Vaping Use: Never used  Substance Use Topics   Alcohol use: No    Alcohol/week: 0.0 standard drinks of alcohol   Drug use: No    Allergies as of 02/07/2022 - Review Complete 02/07/2022  Allergen Reaction Noted   Other Anaphylaxis 04/06/2016   Penicillins Anaphylaxis, Rash, and Other (See Comments) 03/28/2014   Yellow jacket venom [bee venom] Anaphylaxis 03/28/2014   Codeine Hives 03/28/2014   Crestor [rosuvastatin] Nausea And Vomiting 03/28/2014   Gemfibrozil Rash, Nausea And Vomiting, and Swelling 03/28/2014   Trazodone and nefazodone Nausea And Vomiting 10/10/2015    Lipitor [atorvastatin] Nausea Only 03/28/2014    Review of Systems:    All systems reviewed and negative except where noted in HPI.   Physical Exam:  Vital signs in last 24 hours: Temp:  [98.2 F (36.8 C)-98.7 F (37.1 C)] 98.3 F (36.8 C) (01/12 0742) Pulse Rate:  [95-139] 105 (01/12 0742) Resp:  [16-18] 18 (01/12 0742) BP: (97-129)/(69-90) 120/80 (01/12 0742) SpO2:  [92 %-99 %] 92 % (01/12 0742) Weight:  [69.2 kg] 69.2 kg (01/12 0105) Last BM Date : 02/06/22 General:   Pleasant, cooperative in NAD Head:  Normocephalic and atraumatic. Eyes:  No icterus.   Conjunctiva pink. PERRLA. Ears:  Normal auditory acuity. Neck:  Supple; no masses or thyroidomegaly Lungs: Respirations even and unlabored. Lungs clear to auscultation bilaterally.   No wheezes, crackles, or rhonchi.  Heart:  Regular rate and rhythm;  Without murmur, clicks, rubs or gallops Abdomen:  Soft, nondistended, nontender. Normal bowel sounds. No appreciable masses or hepatomegaly.  No rebound or guarding.  Neurologic:  Alert and oriented x3;  grossly normal neurologically. Skin:  Intact without significant lesions or rashes. Cervical Nodes:  No significant cervical adenopathy. Psych:  Alert and cooperative. Normal affect.  LAB RESULTS: Recent Labs    02/07/22 1334 02/08/22 0400  WBC 12.0* 8.7  HGB 13.8 12.1  HCT 43.7 38.6  PLT 259 196   BMET Recent Labs    02/07/22 1334 02/08/22 0400  NA 140 143  K 3.6 3.0*  CL 101 109  CO2 24 23  GLUCOSE 138* 113*  BUN 19 19  CREATININE 0.66 0.64  CALCIUM 9.4 8.3*   LFT Recent Labs    02/08/22 0400  PROT 7.3  ALBUMIN 4.2  AST 15  ALT 11  ALKPHOS 61  BILITOT 1.4*   PT/INR No results for input(s): "LABPROT", "INR" in the last 72 hours.  STUDIES: CT HEAD WO CONTRAST ( )  Result Date: 02/07/2022 CLINICAL DATA:  New onset headache. Difficulty swallowing and nausea. EXAM: CT HEAD WITHOUT CONTRAST TECHNIQUE: Contiguous axial images were obtained from  the base of the skull through the vertex without intravenous contrast. RADIATION DOSE REDUCTION: This exam was performed according to the departmental dose-optimization program which includes automated exposure control, adjustment of the mA and/or kV according to patient size and/or use of iterative reconstruction technique. COMPARISON:  05/09/2021 FINDINGS: Brain: Remote right cerebellar stroke. Small remote right occipital lobe stroke. Periventricular white matter and corona radiata hypodensities favor chronic ischemic microvascular white matter disease. Otherwise, the brainstem, cerebellum, cerebral peduncles, thalamus, basal ganglia, basilar cisterns, and ventricular system appear within normal limits. No intracranial hemorrhage, mass lesion, or acute CVA. Vascular: Unremarkable Skull: Unremarkable Sinuses/Orbits: Unremarkable Other: No supplemental non-categorized findings. IMPRESSION: 1. No acute intracranial findings. 2. Remote right cerebellar and right occipital lobe infarcts. 3. Periventricular white matter and corona radiata hypodensities favor chronic ischemic microvascular white matter disease. Electronically Signed   By: Gaylyn Rong M.D.   On: 02/07/2022 17:07   CT Soft Tissue Neck W Contrast  Result Date: 02/07/2022 CLINICAL DATA:  Soft tissue infection suspected, neck, xray done EXAM: CT NECK WITH CONTRAST TECHNIQUE: Multidetector CT imaging of the neck was performed using the standard protocol following the bolus administration of intravenous contrast. RADIATION DOSE REDUCTION: This exam was performed according to the departmental dose-optimization program which includes automated exposure control, adjustment of the mA and/or kV according to patient size and/or use of iterative reconstruction technique. CONTRAST:  OMNIPAQUE IOHEXOL 300 MG/ML  SOLN COMPARISON:  None Available. FINDINGS: Pharynx and larynx: Mildly prominent tonsillar tissue without discrete mass. Unremarkable larynx.  Salivary glands: No inflammation, mass, or stone. Thyroid: Normal. Lymph nodes: None enlarged or abnormal density. Vascular: Suspected severe stenosis of the right V2 vertebral artery due to osteophytes. Limited intracranial: Better evaluated on same day CT head. Visualized orbits: Negative. Mastoids and visualized paranasal sinuses: Clear. Skeleton: Multilevel degenerative change. No acute findings on limited assessment. Upper chest: Soft tissue filling and expanding the partially visualized intrathoracic esophagus. Partially imaged treatment change in the right perihilar region. IMPRESSION: 1. Soft tissue filling and expanding the partially visualized  intrathoracic esophagus. The appearance is not substantially changed relative to CT of the chest from April 19, 23, but findings are incompletely assessed/imaged. Consider further evaluation with endoscopy to evaluate for stricture and/or malignancy. 2. Partially imaged treatment change in the right perihilar region. CT of the chest could further characterize. 3. Suspected severe stenosis of the right V2 vertebral artery due to osteophytes. A CTA of the neck could further evaluate if clinically warranted. Electronically Signed   By: Feliberto Harts M.D.   On: 02/07/2022 16:57   CT ABDOMEN PELVIS W CONTRAST  Result Date: 02/07/2022 CLINICAL DATA:  Nausea and abdominal pain. EXAM: CT ABDOMEN AND PELVIS WITH CONTRAST TECHNIQUE: Multidetector CT imaging of the abdomen and pelvis was performed using the standard protocol following bolus administration of intravenous contrast. RADIATION DOSE REDUCTION: This exam was performed according to the departmental dose-optimization program which includes automated exposure control, adjustment of the mA and/or kV according to patient size and/or use of iterative reconstruction technique. CONTRAST:  OMNIPAQUE IOHEXOL 300 MG/ML  SOLN COMPARISON:  CT abdomen and pelvis 05/16/2021 FINDINGS: Lower chest: Are ill-defined  patchy nodular ground-glass and airspace opacities in both lung bases, new from prior. Hepatobiliary: No focal liver abnormality is seen. No gallstones, gallbladder wall thickening, or biliary dilatation. Pancreas: Unremarkable. No pancreatic ductal dilatation or surrounding inflammatory changes. Spleen: Normal in size without focal abnormality. Adrenals/Urinary Tract: Adrenal glands are unremarkable. Kidneys are normal, without renal calculi, focal lesion, or hydronephrosis. Bladder is unremarkable. Stomach/Bowel: There is mild diffuse colonic wall thickening versus normal under distension. There is no surrounding inflammation. The appendix, stomach and small bowel appear within normal limits. There are few scattered colonic diverticula. No dilated bowel loops. No free air. Vascular/Lymphatic: Aortic atherosclerosis. No enlarged abdominal or pelvic lymph nodes. Reproductive: Uterus and bilateral adnexa are unremarkable. Other: No abdominal wall hernia or abnormality. No abdominopelvic ascites. Musculoskeletal: Mild chronic compression deformity of L1 is unchanged. No acute fractures are seen. IMPRESSION: 1. Mild diffuse colonic wall thickening versus normal under distension. Correlate clinically for colitis. 2. Patchy nodular ground-glass and airspace opacities in both lung bases, new from prior, likely infectious/inflammatory. Aortic Atherosclerosis (ICD10-I70.0). Electronically Signed   By: Darliss Cheney M.D.   On: 02/07/2022 16:52   DG Chest 2 View  Result Date: 02/07/2022 CLINICAL DATA:  Tachycardia EXAM: CHEST - 2 VIEW COMPARISON:  05/16/2021, 09/06/2021 FINDINGS: Stable cardiomediastinal contours. Post treatment changes with scarring in the right perihilar region with associated volume loss. Minimal left basilar atelectasis. Lungs are otherwise clear. No pleural effusion or pneumothorax. IMPRESSION: Minimal left basilar atelectasis. Electronically Signed   By: Duanne Guess D.O.   On: 02/07/2022 14:53       Impression / Plan:   Yvette Riley is a 67 y.o. y/o female with dysphagia.  History of lung cancer s/p chemotherapy and radiation.  Recent upper endoscopy at [facility] showed no abnormality and plan was for outpatient esophageal manometry.  In the ER she had a CT scan of the neck which showed an abnormality in the esophagus and recommended upper endoscopy.  Plan 1.  EGD 2.  If EGD is negative she will need inpatient speech pathology consult and possibly a modified barium swallow if possible to assess for transfer dysphagia , outpatient EUS to rule out any extra luminal abnormality causing obstruction from external compression and manometry at [facility]  I have discussed alternative options, risks & benefits,  which include, but are not limited to, bleeding, infection, perforation,respiratory complication &  drug reaction.  The patient agrees with this plan & written consent will be obtained.     Thank you for involving me in the care of this patient.      LOS: 1 day   Wyline Mood, MD  02/08/2022, 10:36 AM

## 2022-02-09 DIAGNOSIS — I2510 Atherosclerotic heart disease of native coronary artery without angina pectoris: Secondary | ICD-10-CM | POA: Diagnosis not present

## 2022-02-09 DIAGNOSIS — R1319 Other dysphagia: Secondary | ICD-10-CM | POA: Diagnosis not present

## 2022-02-09 DIAGNOSIS — J449 Chronic obstructive pulmonary disease, unspecified: Secondary | ICD-10-CM | POA: Diagnosis not present

## 2022-02-09 DIAGNOSIS — E1129 Type 2 diabetes mellitus with other diabetic kidney complication: Secondary | ICD-10-CM | POA: Diagnosis not present

## 2022-02-09 LAB — BASIC METABOLIC PANEL
Anion gap: 8 (ref 5–15)
BUN: 12 mg/dL (ref 8–23)
CO2: 20 mmol/L — ABNORMAL LOW (ref 22–32)
Calcium: 8 mg/dL — ABNORMAL LOW (ref 8.9–10.3)
Chloride: 112 mmol/L — ABNORMAL HIGH (ref 98–111)
Creatinine, Ser: 0.57 mg/dL (ref 0.44–1.00)
GFR, Estimated: >60 mL/min (ref >60)
Glucose, Bld: 115 mg/dL — ABNORMAL HIGH (ref 70–99)
Potassium: 3.6 mmol/L (ref 3.5–5.1)
Sodium: 140 mmol/L (ref 135–145)

## 2022-02-09 MED ORDER — SUCRALFATE 1 GM/10ML PO SUSP: 1.0000 g | Freq: Four times a day (QID) | ORAL | 0 refills | Status: DC

## 2022-02-09 MED ORDER — OMEPRAZOLE 40 MG PO CPDR: 40.0000 mg | DELAYED_RELEASE_CAPSULE | Freq: Two times a day (BID) | ORAL | 0 refills | Status: AC

## 2022-02-09 NOTE — Discharge Summary (Signed)
Physician Discharge Summary   Patient: Yvette Riley MRN: [id number]  DOB: [birth year]   Admit:     Date of Admission: 02/07/2022 Admitted from: home   Discharge: Date of discharge: 02/09/22 Disposition: Home Condition at discharge: good  CODE STATUS: FULL CODE      Discharge Physician: Sunnie Nielsen, DO [facility]     PCP: Malva Limes, MD  Recommendations for Outpatient Follow-up:  Follow up with PCP Malva Limes, MD in 1-2 weeks Follow w/ GI for repeat EGD  Please obtain labs/tests: CBC, BMP in 1-2 weeks  Consider neurology follow up re: vertebral artery stenosis as below  Please follow up on the following pending results: none PCP AND OTHER OUTPATIENT PROVIDERS: SEE BELOW FOR SPECIFIC DISCHARGE INSTRUCTIONS PRINTED FOR PATIENT IN ADDITION TO GENERIC AVS PATIENT INFO     Discharge Instructions     Diet - low sodium heart healthy   Complete by: As directed    Discharge instructions   Complete by: As directed    mechanical soft diet for 4 to 6 weeks - see printed information  Prilosec 40 mg twice daily for 8 weeks - prescription sent Carafate 4 times daily for 8 weeks - prescription sent will need repeat EGD with primary GI at [facility] in 6 to 8 weeks to confirm that the ulcer is healed.   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 female with PMH CVA with residual right-sided weakness on  aspirin/Plavix, CAD, HLD, DM2, HTN, GERD, lung cancer in remission status post radiation/chemotherapy, asthma/COPD, anxiety/depression, chronic pain on long term opiates. Patient is a poor historian. She reports history of dysphagia/difficulty swallowing, unclear timing but she states at least for a couple of weeks, worse over the past week or so to the point where she is unable to keep down liquids or solids, cannot recall last solid meal she had.  In the emergency room she had a CT scan of the neck abdomen pelvis and head that showed soft tissue filling and expanding the partially visualized intrathoracic esophagus appearance not substantially changed from CT scan of the chest in April 2023 but findings are incomplete - admitted to hospital for endoscopy for further evaluation.  EGD here 02/08/22 -large esophageal ulcer in the middle third of esophagus possibly due to pill impaction but no other significant findings.  Suspect globus sensation due to history of radiation.  SLP cleared.  Advancing diet.  If tolerating p.o., patient is advised that she will be discharged early tomorrow morning plan to proceed with a mechanical soft diet for 4 to 6 weeks with Prilosec 40 mg twice daily for 8 weeks and Carafate 4 times daily for 8 weeks she will need repeat EGD with her primary GI at [facility] in 6 to 8 weeks to confirm that the ulcer is healed.  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    SIRS (systemic inflammatory response syndrome) (HCC) -resolved  Likely due to dehydration  Tachycardia likely rebound due to discontinuation of beta-blocker, resolved with restarting beta-blocker Sepsis r/o Monitor VS   Hypokalemia - resolved Follow outpatient   COPD (chronic obstructive pulmonary disease) (HCC) Smoking greater than 30 pack years -patient quit when diagnosed with lung cancer Hx Small cell lung cancer, right (HCC) Radiation sequela likely causing esophageal abnormality/dysphagia DuoNebs as needed resume montelukast   Coronary atherosclerosis Essential hypertension Tachycardia initially thought dehydration, improved likely was still present due to beta-blocker withdrawal - resolved back on beta blocker Continue home meds w/ beta-blocker, aspirin, ACE, fibrate (statin intolerant)   History of cerebellar CVA with residual deficit 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  Statin intolerant Right vertebral artery stenosis outpatient follow-up, no new CVA symptoms   Type 2 diabetes mellitus (HCC) A1c          Discharge Instructions  Allergies as of 02/09/2022       Reactions   Other Anaphylaxis   Penicillins Anaphylaxis, Rash, Other (See Comments)   Reaction:  Tongue swelling  Has patient had a PCN reaction causing immediate rash, facial/tongue/throat swelling, SOB or lightheadedness with hypotension:  Yes   Has patient had a PCN reaction causing severe rash  involving mucus membranes or skin necrosis: No Has patient had a PCN reaction that required hospitalization No Has patient had a PCN reaction occurring within the last 10 years: No If all of the above answers are "NO", then may proceed with Cephalosporin use.   Yellow Jacket Venom [bee Venom] Anaphylaxis   Codeine Hives   Crestor [rosuvastatin] Nausea And Vomiting   Corrected prior adverse reaction per [facility] [employer]. On 12/02/3011  patient reported N & V when she takes Crestor   Gemfibrozil Rash, Nausea And Vomiting, Swelling   Trazodone And Nefazodone Nausea And Vomiting   Lipitor [atorvastatin] Nausea Only   By patient report 12/02/2011. Had also been prescribed pravastatin and lovastatin by previous MD. Unclear if those caused same side effects.         Medication List     STOP taking these medications    meloxicam 15 MG tablet Commonly known as: MOBIC   pantoprazole 40 MG tablet Commonly known as: PROTONIX       TAKE these medications    albuterol 108 (90 Base) MCG/ACT inhaler Commonly known as: Proventil HFA Inhale 2 puffs  into the lungs every 6 (six) hours as needed for wheezing or shortness of breath.   clopidogrel 75 MG tablet Commonly known as: PLAVIX Take 1 tablet (75 mg total) by mouth daily.   cyanocobalamin 1000 MCG tablet Commonly known as: VITAMIN B12 Take 1 tablet (1,000 mcg total) by mouth daily.   diclofenac Sodium 1 % Gel Commonly known as: VOLTAREN Apply 4 g topically 4 (four) times daily. What changed:  when to take this reasons to take this additional instructions   gabapentin 300 MG capsule Commonly known as: NEURONTIN Take 300 mg by mouth 2 (two) times daily.   HumaLOG 100 UNIT/ML injection Generic drug: insulin lispro Inject 2-12 Units into the skin 3 (three) times daily with meals. 200-249: 2u 250-299: 4u 300-349: 6u 350-399: 8u 400-449: 10u 450-499: 12u   metFORMIN 500 MG tablet Commonly known as: GLUCOPHAGE Take 500 mg  by mouth 2 (two) times daily with a meal.   metoprolol succinate 25 MG 24 hr tablet Commonly known as: TOPROL-XL Take 1 tablet (25 mg total) by mouth daily.   nystatin powder Generic drug: nystatin Apply 1 Application topically every 12 (twelve) hours as needed (for fungal rash).   omeprazole 40 MG capsule Commonly known as: PRILOSEC Take 1 capsule (40 mg total) by mouth 2 (two) times daily. Take in the morning and at dinnertime.   ondansetron 4 MG tablet Commonly known as: ZOFRAN Take 4 mg by mouth every 6 (six) hours as needed for nausea.   oxyCODONE-acetaminophen 10-325 MG tablet Commonly known as: PERCOCET TAKE ONE TABLET BY MOUTH EVERY 4 HOURS AS NEEDED (VIAL)   polyethylene glycol 17 g packet Commonly known as: MIRALAX / GLYCOLAX Take 17 g by mouth daily as needed for mild constipation.   senna 8.6 MG Tabs tablet Commonly known as: SENOKOT Take 2 tablets by mouth daily.   sertraline 50 MG tablet Commonly known as: ZOLOFT Take 1 tablet (50 mg total) by mouth daily.   sucralfate 1 GM/10ML suspension Commonly known as: Carafate Take 10 mLs (1 g total) by mouth 4 (four) times daily.   umeclidinium-vilanterol 62.5-25 MCG/ACT Aepb Commonly known as: ANORO ELLIPTA Inhale 1 puff into the lungs daily at 6 (six) AM.          Allergies  Allergen Reactions   Other Anaphylaxis   Penicillins Anaphylaxis, Rash and Other (See Comments)    Reaction:  Tongue swelling  Has patient had a PCN reaction causing immediate rash, facial/tongue/throat swelling, SOB or lightheadedness with hypotension:  Yes   Has patient had a PCN reaction causing severe rash involving mucus membranes or skin necrosis: No Has patient had a PCN reaction that required hospitalization No Has patient had a PCN reaction occurring within the last 10 years: No If all of the above answers are "NO", then may proceed with Cephalosporin use.   Yellow Jacket Venom [Bee Venom] Anaphylaxis   Codeine Hives    Crestor [Rosuvastatin] Nausea And Vomiting    Corrected prior adverse reaction per [facility] [employer]. On 12/02/3011  patient reported N & V when she takes Crestor   Gemfibrozil Rash, Nausea And Vomiting and Swelling   Trazodone And Nefazodone Nausea And Vomiting   Lipitor [Atorvastatin] Nausea Only    By patient report 12/02/2011. Had also been prescribed pravastatin and lovastatin by previous MD. Unclear if those caused same side effects.      Subjective: Patient tolerating diet this morning, no pain, reports feeling much better compared to yesterday  Discharge Exam: BP 106/77 (BP Location: Left Arm)   Pulse 91   Temp 98.3 F (36.8 C) (Oral)   Resp 18   Ht 4\' 9"  (1.448 m)   Wt 69.2 kg   SpO2 97%   BMI 33.01 kg/m  General: Pt is alert, awake, not in acute distress Cardiovascular: RRR, S1/S2 +, no rubs, no gallops Respiratory: + wheezing, no rhonchi Abdominal: Soft, NT, ND, bowel sounds + Extremities: no edema, no cyanosis     The results of significant diagnostics from this hospitalization (including imaging, microbiology, ancillary and laboratory) are listed below for reference.     Microbiology: Recent Results (from the past 240 hour(s))  Resp panel by RT-PCR (RSV, Flu A&B, Covid) Anterior Nasal Swab     Status: None   Collection Time: 02/07/22  4:43 PM   Specimen: Anterior Nasal Swab  Result Value Ref Range Status   SARS Coronavirus 2 by RT PCR NEGATIVE NEGATIVE Final    Comment: (NOTE) SARS-CoV-2 target nucleic acids are NOT DETECTED.  The SARS-CoV-2 RNA is generally detectable in upper respiratory specimens during the acute phase of infection. The lowest concentration of SARS-CoV-2 viral copies this assay can detect is 138 copies/mL. A negative result does not preclude SARS-Cov-2 infection and should not be used as the sole basis for treatment or other patient management decisions. A negative result may occur with  improper specimen collection/handling,  submission of specimen other than nasopharyngeal swab, presence of viral mutation(s) within the areas targeted by this assay, and inadequate number of viral copies(<138 copies/mL). A negative result must be combined with clinical observations, patient history, and epidemiological information. The expected result is Negative.  Fact Sheet for Patients:  04/08/22  Fact Sheet for Healthcare Providers:  BloggerCourse.com  This test is no t yet approved or cleared by the [place] FDA and  has been authorized for detection and/or diagnosis of SARS-CoV-2 by FDA under an Emergency Use Authorization (EUA). This EUA will remain  in effect (meaning this test can be used) for the duration of the COVID-19 declaration under Section 564(b)(1) of the Act, 21 U.S.C.section 360bbb-3(b)(1), unless the authorization is terminated  or revoked sooner.       Influenza A by PCR NEGATIVE NEGATIVE Final   Influenza B by PCR NEGATIVE NEGATIVE Final    Comment: (NOTE) The Xpert Xpress SARS-CoV-2/FLU/RSV plus assay is intended as an aid in the diagnosis of influenza from Nasopharyngeal swab specimens and should not be used as a sole basis for treatment. Nasal washings and aspirates are unacceptable for Xpert Xpress SARS-CoV-2/FLU/RSV testing.  Fact Sheet for Patients: [place]  Fact Sheet for Healthcare Providers: BloggerCourse.com  This test is not yet approved or cleared by the [employer] FDA and has been authorized for detection and/or diagnosis of SARS-CoV-2 by FDA under an Emergency Use Authorization (EUA). This EUA will remain in effect (meaning this test can be used) for the duration of the COVID-19 declaration under Section 564(b)(1) of the Act, 21 U.S.C. section 360bbb-3(b)(1), unless the authorization is terminated or revoked.     Resp Syncytial Virus by PCR NEGATIVE  NEGATIVE Final    Comment: (NOTE) Fact Sheet for Patients: [place]  Fact Sheet for Healthcare Providers: BloggerCourse.com  This test is not yet approved or cleared by the [employer] FDA and has been authorized for detection and/or diagnosis of SARS-CoV-2 by FDA under an Emergency Use Authorization (EUA). This EUA will remain in effect (meaning this test can be used) for the duration  of the COVID-19 declaration under Section 564(b)(1) of the Act, 21 U.S.C. section 360bbb-3(b)(1), unless the authorization is terminated or revoked.  Performed at [facility], [address] [place], [place] [address]   MRSA Next Gen by PCR, Nasal     Status: None   Collection Time: 02/08/22  1:54 AM   Specimen: Nasal Mucosa; Nasal Swab  Result Value Ref Range Status   MRSA by PCR Next Gen NOT DETECTED NOT DETECTED Final    Comment: (NOTE) The GeneXpert MRSA Assay (FDA approved for NASAL specimens only), is one component of a comprehensive MRSA colonization surveillance program. It is not intended to diagnose MRSA infection nor to guide or monitor treatment for MRSA infections. Test performance is not FDA approved in patients less than 43 years old. Performed at [facility], [address] [place], [place] [address]      Labs: BNP (last 3 results) No results for input(s): "BNP" in the last 8760 hours. Basic Metabolic Panel: Recent Labs  Lab 02/07/22 1334 02/08/22 0400 02/09/22 0656  NA 140 143 140  K 3.6 3.0* 3.6  CL 101 109 112*  CO2 24 23 20*  GLUCOSE 138* 113* 115*  BUN 19 19 12   CREATININE 0.66 0.64 0.57  CALCIUM 9.4 8.3* 8.0*   Liver Function Tests: Recent Labs  Lab 02/07/22 1334 02/08/22 0400  AST 23 15  ALT 12 11  ALKPHOS 69 61  BILITOT 1.9* 1.4*  PROT 8.3* 7.3  ALBUMIN 5.0 4.2   No results for input(s): "LIPASE", "AMYLASE" in the last 168 hours. No results for input(s): "AMMONIA"  in the last 168 hours. CBC: Recent Labs  Lab 02/07/22 1334 02/08/22 0400  WBC 12.0* 8.7  NEUTROABS 10.1*  --   HGB 13.8 12.1  HCT 43.7 38.6  MCV 89.9 90.8  PLT 259 196   Cardiac Enzymes: No results for input(s): "CKTOTAL", "CKMB", "CKMBINDEX", "TROPONINI" in the last 168 hours. BNP: Invalid input(s): "POCBNP" CBG: No results for input(s): "GLUCAP" in the last 168 hours. D-Dimer No results for input(s): "DDIMER" in the last 72 hours. Hgb A1c No results for input(s): "HGBA1C" in the last 72 hours. Lipid Profile No results for input(s): "CHOL", "HDL", "LDLCALC", "TRIG", "CHOLHDL", "LDLDIRECT" in the last 72 hours. Thyroid function studies No results for input(s): "TSH", "T4TOTAL", "T3FREE", "THYROIDAB" in the last 72 hours.  Invalid input(s): "FREET3" Anemia work up No results for input(s): "VITAMINB12", "FOLATE", "FERRITIN", "TIBC", "IRON", "RETICCTPCT" in the last 72 hours. Urinalysis N/a  Sepsis Labs Recent Labs  Lab 02/07/22 1334 02/08/22 0400  WBC 12.0* 8.7   Microbiology Recent Results (from the past 240 hour(s))  Resp panel by RT-PCR (RSV, Flu A&B, Covid) Anterior Nasal Swab     Status: None   Collection Time: 02/07/22  4:43 PM   Specimen: Anterior Nasal Swab  Result Value Ref Range Status   SARS Coronavirus 2 by RT PCR NEGATIVE NEGATIVE Final    Comment: (NOTE) SARS-CoV-2 target nucleic acids are NOT DETECTED.  The SARS-CoV-2 RNA is generally detectable in upper respiratory specimens during the acute phase of infection. The lowest concentration of SARS-CoV-2 viral copies this assay can detect is 138 copies/mL. A negative result does not preclude SARS-Cov-2 infection and should not be used as the sole basis for treatment or other patient management decisions. A negative result may occur with  improper specimen collection/handling, submission of specimen other than nasopharyngeal swab, presence of viral mutation(s) within the areas targeted by this assay,  and  inadequate number of viral copies(<138 copies/mL). A negative result must be combined with clinical observations, patient history, and epidemiological information. The expected result is Negative.  Fact Sheet for Patients:  BloggerCourse.com  Fact Sheet for Healthcare Providers:  [employer]  This test is no t yet approved or cleared by the [place] FDA and  has been authorized for detection and/or diagnosis of SARS-CoV-2 by FDA under an Emergency Use Authorization (EUA). This EUA will remain  in effect (meaning this test can be used) for the duration of the COVID-19 declaration under Section 564(b)(1) of the Act, 21 U.S.C.section 360bbb-3(b)(1), unless the authorization is terminated  or revoked sooner.       Influenza A by PCR NEGATIVE NEGATIVE Final   Influenza B by PCR NEGATIVE NEGATIVE Final    Comment: (NOTE) The Xpert Xpress SARS-CoV-2/FLU/RSV plus assay is intended as an aid in the diagnosis of influenza from Nasopharyngeal swab specimens and should not be used as a sole basis for treatment. Nasal washings and aspirates are unacceptable for Xpert Xpress SARS-CoV-2/FLU/RSV testing.  Fact Sheet for Patients: BloggerCourse.com  Fact Sheet for Healthcare Providers: [employer]  This test is not yet approved or cleared by the [place] FDA and has been authorized for detection and/or diagnosis of SARS-CoV-2 by FDA under an Emergency Use Authorization (EUA). This EUA will remain in effect (meaning this test can be used) for the duration of the COVID-19 declaration under Section 564(b)(1) of the Act, 21 U.S.C. section 360bbb-3(b)(1), unless the authorization is terminated or revoked.     Resp Syncytial Virus by PCR NEGATIVE NEGATIVE Final    Comment: (NOTE) Fact Sheet for Patients: BloggerCourse.com  Fact Sheet for  Healthcare Providers: [employer]  This test is not yet approved or cleared by the [place] FDA and has been authorized for detection and/or diagnosis of SARS-CoV-2 by FDA under an Emergency Use Authorization (EUA). This EUA will remain in effect (meaning this test can be used) for the duration of the COVID-19 declaration under Section 564(b)(1) of the Act, 21 U.S.C. section 360bbb-3(b)(1), unless the authorization is terminated or revoked.  Performed at [facility], [address] [place], [place] [address]   MRSA Next Gen by PCR, Nasal     Status: None   Collection Time: 02/08/22  1:54 AM   Specimen: Nasal Mucosa; Nasal Swab  Result Value Ref Range Status   MRSA by PCR Next Gen NOT DETECTED NOT DETECTED Final    Comment: (NOTE) The GeneXpert MRSA Assay (FDA approved for NASAL specimens only), is one component of a comprehensive MRSA colonization surveillance program. It is not intended to diagnose MRSA infection nor to guide or monitor treatment for MRSA infections. Test performance is not FDA approved in patients less than 19 years old. Performed at [facility], [address] [place], [place] [address]    Imaging CT HEAD WO CONTRAST ( )  Result Date: 02/07/2022 CLINICAL DATA:  New onset headache. Difficulty swallowing and nausea. EXAM: CT HEAD WITHOUT CONTRAST TECHNIQUE: Contiguous axial images were obtained from the base of the skull through the vertex without intravenous contrast. RADIATION DOSE REDUCTION: This exam was performed according to the departmental dose-optimization program which includes automated exposure control, adjustment of the mA and/or kV according to patient size and/or use of iterative reconstruction technique. COMPARISON:  05/09/2021 FINDINGS: Brain: Remote right cerebellar stroke. Small remote right occipital lobe stroke. Periventricular white matter and corona radiata hypodensities favor chronic  ischemic microvascular white matter disease. Otherwise, the brainstem,  cerebellum, cerebral peduncles, thalamus, basal ganglia, basilar cisterns, and ventricular system appear within normal limits. No intracranial hemorrhage, mass lesion, or acute CVA. Vascular: Unremarkable Skull: Unremarkable Sinuses/Orbits: Unremarkable Other: No supplemental non-categorized findings. IMPRESSION: 1. No acute intracranial findings. 2. Remote right cerebellar and right occipital lobe infarcts. 3. Periventricular white matter and corona radiata hypodensities favor chronic ischemic microvascular white matter disease. Electronically Signed   By: Gaylyn Rong M.D.   On: 02/07/2022 17:07   CT Soft Tissue Neck W Contrast  Result Date: 02/07/2022 CLINICAL DATA:  Soft tissue infection suspected, neck, xray done EXAM: CT NECK WITH CONTRAST TECHNIQUE: Multidetector CT imaging of the neck was performed using the standard protocol following the bolus administration of intravenous contrast. RADIATION DOSE REDUCTION: This exam was performed according to the departmental dose-optimization program which includes automated exposure control, adjustment of the mA and/or kV according to patient size and/or use of iterative reconstruction technique. CONTRAST:  OMNIPAQUE IOHEXOL 300 MG/ML  SOLN COMPARISON:  None Available. FINDINGS: Pharynx and larynx: Mildly prominent tonsillar tissue without discrete mass. Unremarkable larynx. Salivary glands: No inflammation, mass, or stone. Thyroid: Normal. Lymph nodes: None enlarged or abnormal density. Vascular: Suspected severe stenosis of the right V2 vertebral artery due to osteophytes. Limited intracranial: Better evaluated on same day CT head. Visualized orbits: Negative. Mastoids and visualized paranasal sinuses: Clear. Skeleton: Multilevel degenerative change. No acute findings on limited assessment. Upper chest: Soft tissue filling and expanding the partially visualized intrathoracic  esophagus. Partially imaged treatment change in the right perihilar region. IMPRESSION: 1. Soft tissue filling and expanding the partially visualized intrathoracic esophagus. The appearance is not substantially changed relative to CT of the chest from April 19, 23, but findings are incompletely assessed/imaged. Consider further evaluation with endoscopy to evaluate for stricture and/or malignancy. 2. Partially imaged treatment change in the right perihilar region. CT of the chest could further characterize. 3. Suspected severe stenosis of the right V2 vertebral artery due to osteophytes. A CTA of the neck could further evaluate if clinically warranted. Electronically Signed   By: Feliberto Harts M.D.   On: 02/07/2022 16:57   CT ABDOMEN PELVIS W CONTRAST  Result Date: 02/07/2022 CLINICAL DATA:  Nausea and abdominal pain. EXAM: CT ABDOMEN AND PELVIS WITH CONTRAST TECHNIQUE: Multidetector CT imaging of the abdomen and pelvis was performed using the standard protocol following bolus administration of intravenous contrast. RADIATION DOSE REDUCTION: This exam was performed according to the departmental dose-optimization program which includes automated exposure control, adjustment of the mA and/or kV according to patient size and/or use of iterative reconstruction technique. CONTRAST:  OMNIPAQUE IOHEXOL 300 MG/ML  SOLN COMPARISON:  CT abdomen and pelvis 05/16/2021 FINDINGS: Lower chest: Are ill-defined patchy nodular ground-glass and airspace opacities in both lung bases, new from prior. Hepatobiliary: No focal liver abnormality is seen. No gallstones, gallbladder wall thickening, or biliary dilatation. Pancreas: Unremarkable. No pancreatic ductal dilatation or surrounding inflammatory changes. Spleen: Normal in size without focal abnormality. Adrenals/Urinary Tract: Adrenal glands are unremarkable. Kidneys are normal, without renal calculi, focal lesion, or hydronephrosis. Bladder is unremarkable.  Stomach/Bowel: There is mild diffuse colonic wall thickening versus normal under distension. There is no surrounding inflammation. The appendix, stomach and small bowel appear within normal limits. There are few scattered colonic diverticula. No dilated bowel loops. No free air. Vascular/Lymphatic: Aortic atherosclerosis. No enlarged abdominal or pelvic lymph nodes. Reproductive: Uterus and bilateral adnexa are unremarkable. Other: No abdominal wall hernia or abnormality. No abdominopelvic ascites. Musculoskeletal: Mild chronic compression  deformity of L1 is unchanged. No acute fractures are seen. IMPRESSION: 1. Mild diffuse colonic wall thickening versus normal under distension. Correlate clinically for colitis. 2. Patchy nodular ground-glass and airspace opacities in both lung bases, new from prior, likely infectious/inflammatory. Aortic Atherosclerosis (ICD10-I70.0). Electronically Signed   By: Darliss Cheney M.D.   On: 02/07/2022 16:52   DG Chest 2 View  Result Date: 02/07/2022 CLINICAL DATA:  Tachycardia EXAM: CHEST - 2 VIEW COMPARISON:  05/16/2021, 09/06/2021 FINDINGS: Stable cardiomediastinal contours. Post treatment changes with scarring in the right perihilar region with associated volume loss. Minimal left basilar atelectasis. Lungs are otherwise clear. No pleural effusion or pneumothorax. IMPRESSION: Minimal left basilar atelectasis. Electronically Signed   By: Duanne Guess D.O.   On: 02/07/2022 14:53      Time coordinating discharge: over 30 minutes  SIGNED:  Sunnie Nielsen DO [facility]

## 2022-02-09 NOTE — TOC CM/SW Note (Signed)
Call received from Essie Hart, with [facility]. States patient arrived to facility; however, they are in need of updated discharge summary  CSW faxed discharge summary to [phone], per request. No additional concerns and/or needs noted.

## 2022-02-11 ENCOUNTER — Encounter: Payer: Self-pay | Admitting: Gastroenterology

## 2022-02-19 DIAGNOSIS — E119 Type 2 diabetes mellitus without complications: Secondary | ICD-10-CM | POA: Diagnosis not present

## 2022-02-19 DIAGNOSIS — G8929 Other chronic pain: Secondary | ICD-10-CM | POA: Diagnosis not present

## 2022-03-02 DIAGNOSIS — E119 Type 2 diabetes mellitus without complications: Secondary | ICD-10-CM | POA: Diagnosis not present

## 2022-03-06 DIAGNOSIS — K5900 Constipation, unspecified: Secondary | ICD-10-CM | POA: Diagnosis not present

## 2022-03-06 DIAGNOSIS — J449 Chronic obstructive pulmonary disease, unspecified: Secondary | ICD-10-CM | POA: Diagnosis not present

## 2022-03-06 DIAGNOSIS — I6930 Unspecified sequelae of cerebral infarction: Secondary | ICD-10-CM | POA: Diagnosis not present

## 2022-03-06 DIAGNOSIS — I10 Essential (primary) hypertension: Secondary | ICD-10-CM | POA: Diagnosis not present

## 2022-03-06 DIAGNOSIS — I2510 Atherosclerotic heart disease of native coronary artery without angina pectoris: Secondary | ICD-10-CM | POA: Diagnosis not present

## 2022-03-06 DIAGNOSIS — G629 Polyneuropathy, unspecified: Secondary | ICD-10-CM | POA: Diagnosis not present

## 2022-03-06 DIAGNOSIS — M542 Cervicalgia: Secondary | ICD-10-CM | POA: Diagnosis not present

## 2022-03-13 DIAGNOSIS — M6281 Muscle weakness (generalized): Secondary | ICD-10-CM | POA: Diagnosis not present

## 2022-03-14 DIAGNOSIS — Z765 Malingerer [conscious simulation]: Secondary | ICD-10-CM | POA: Diagnosis not present

## 2022-03-14 DIAGNOSIS — M6281 Muscle weakness (generalized): Secondary | ICD-10-CM | POA: Diagnosis not present

## 2022-03-14 DIAGNOSIS — G8929 Other chronic pain: Secondary | ICD-10-CM | POA: Diagnosis not present

## 2022-03-14 DIAGNOSIS — G4700 Insomnia, unspecified: Secondary | ICD-10-CM | POA: Diagnosis not present

## 2022-03-15 DIAGNOSIS — M6281 Muscle weakness (generalized): Secondary | ICD-10-CM | POA: Diagnosis not present

## 2022-03-18 DIAGNOSIS — M6281 Muscle weakness (generalized): Secondary | ICD-10-CM | POA: Diagnosis not present

## 2022-03-19 DIAGNOSIS — M6281 Muscle weakness (generalized): Secondary | ICD-10-CM | POA: Diagnosis not present

## 2022-03-20 DIAGNOSIS — M6281 Muscle weakness (generalized): Secondary | ICD-10-CM | POA: Diagnosis not present

## 2022-03-21 DIAGNOSIS — M6281 Muscle weakness (generalized): Secondary | ICD-10-CM | POA: Diagnosis not present

## 2022-03-22 DIAGNOSIS — M6281 Muscle weakness (generalized): Secondary | ICD-10-CM | POA: Diagnosis not present

## 2022-03-25 DIAGNOSIS — M6281 Muscle weakness (generalized): Secondary | ICD-10-CM | POA: Diagnosis not present

## 2022-03-26 DIAGNOSIS — M6281 Muscle weakness (generalized): Secondary | ICD-10-CM | POA: Diagnosis not present

## 2022-03-28 DIAGNOSIS — G4700 Insomnia, unspecified: Secondary | ICD-10-CM | POA: Diagnosis not present

## 2022-03-28 DIAGNOSIS — G8929 Other chronic pain: Secondary | ICD-10-CM | POA: Diagnosis not present

## 2022-03-28 DIAGNOSIS — M6281 Muscle weakness (generalized): Secondary | ICD-10-CM | POA: Diagnosis not present

## 2022-03-28 DIAGNOSIS — W57XXXA Bitten or stung by nonvenomous insect and other nonvenomous arthropods, initial encounter: Secondary | ICD-10-CM | POA: Diagnosis not present

## 2022-03-28 DIAGNOSIS — Z765 Malingerer [conscious simulation]: Secondary | ICD-10-CM | POA: Diagnosis not present

## 2022-03-30 DIAGNOSIS — M6281 Muscle weakness (generalized): Secondary | ICD-10-CM | POA: Diagnosis not present

## 2022-04-01 DIAGNOSIS — M6281 Muscle weakness (generalized): Secondary | ICD-10-CM | POA: Diagnosis not present

## 2022-04-02 DIAGNOSIS — M6281 Muscle weakness (generalized): Secondary | ICD-10-CM | POA: Diagnosis not present

## 2022-04-03 DIAGNOSIS — M6281 Muscle weakness (generalized): Secondary | ICD-10-CM | POA: Diagnosis not present

## 2022-04-08 DIAGNOSIS — M6281 Muscle weakness (generalized): Secondary | ICD-10-CM | POA: Diagnosis not present

## 2022-04-09 DIAGNOSIS — M6281 Muscle weakness (generalized): Secondary | ICD-10-CM | POA: Diagnosis not present

## 2022-04-10 DIAGNOSIS — M6281 Muscle weakness (generalized): Secondary | ICD-10-CM | POA: Diagnosis not present

## 2022-04-11 DIAGNOSIS — M6281 Muscle weakness (generalized): Secondary | ICD-10-CM | POA: Diagnosis not present

## 2022-04-12 DIAGNOSIS — M6281 Muscle weakness (generalized): Secondary | ICD-10-CM | POA: Diagnosis not present

## 2022-04-15 DIAGNOSIS — M6281 Muscle weakness (generalized): Secondary | ICD-10-CM | POA: Diagnosis not present

## 2022-04-17 DIAGNOSIS — M6281 Muscle weakness (generalized): Secondary | ICD-10-CM | POA: Diagnosis not present

## 2022-04-20 DIAGNOSIS — M6281 Muscle weakness (generalized): Secondary | ICD-10-CM | POA: Diagnosis not present

## 2022-04-22 DIAGNOSIS — M6281 Muscle weakness (generalized): Secondary | ICD-10-CM | POA: Diagnosis not present

## 2022-04-24 DIAGNOSIS — M6281 Muscle weakness (generalized): Secondary | ICD-10-CM | POA: Diagnosis not present

## 2022-04-25 DIAGNOSIS — W57XXXA Bitten or stung by nonvenomous insect and other nonvenomous arthropods, initial encounter: Secondary | ICD-10-CM | POA: Diagnosis not present

## 2022-04-26 DIAGNOSIS — M6281 Muscle weakness (generalized): Secondary | ICD-10-CM | POA: Diagnosis not present

## 2022-04-29 DIAGNOSIS — M6281 Muscle weakness (generalized): Secondary | ICD-10-CM | POA: Diagnosis not present

## 2022-04-29 DIAGNOSIS — R1314 Dysphagia, pharyngoesophageal phase: Secondary | ICD-10-CM | POA: Diagnosis not present

## 2022-05-01 DIAGNOSIS — R1314 Dysphagia, pharyngoesophageal phase: Secondary | ICD-10-CM | POA: Diagnosis not present

## 2022-05-01 DIAGNOSIS — M6281 Muscle weakness (generalized): Secondary | ICD-10-CM | POA: Diagnosis not present

## 2022-05-09 DIAGNOSIS — J449 Chronic obstructive pulmonary disease, unspecified: Secondary | ICD-10-CM | POA: Diagnosis not present

## 2022-05-09 DIAGNOSIS — G4700 Insomnia, unspecified: Secondary | ICD-10-CM | POA: Diagnosis not present

## 2022-05-09 DIAGNOSIS — Z765 Malingerer [conscious simulation]: Secondary | ICD-10-CM | POA: Diagnosis not present

## 2022-05-14 DIAGNOSIS — E119 Type 2 diabetes mellitus without complications: Secondary | ICD-10-CM | POA: Diagnosis not present

## 2022-05-14 DIAGNOSIS — Z79899 Other long term (current) drug therapy: Secondary | ICD-10-CM | POA: Diagnosis not present

## 2022-05-16 DIAGNOSIS — E569 Vitamin deficiency, unspecified: Secondary | ICD-10-CM | POA: Diagnosis not present

## 2022-05-17 ENCOUNTER — Ambulatory Visit: Payer: 59 | Admitting: Anesthesiology

## 2022-05-17 ENCOUNTER — Ambulatory Visit: Admit: 2022-05-17 | Payer: 59 | Admitting: Gastroenterology

## 2022-05-17 ENCOUNTER — Encounter
Admission: EM | Disposition: A | Discharge status: Home / Self Care | Payer: Self-pay | Source: Home / Self Care | Attending: Emergency Medicine

## 2022-05-17 ENCOUNTER — Encounter: Payer: Self-pay | Admitting: Emergency Medicine

## 2022-05-17 ENCOUNTER — Ambulatory Visit
Admission: EM | Admit: 2022-05-17 | Discharge: 2022-05-17 | Disposition: A | Discharge status: Home / Self Care | Payer: 59 | Attending: Emergency Medicine | Admitting: Emergency Medicine

## 2022-05-17 ENCOUNTER — Other Ambulatory Visit: Payer: Self-pay

## 2022-05-17 DIAGNOSIS — Z85118 Personal history of other malignant neoplasm of bronchus and lung: Secondary | ICD-10-CM | POA: Insufficient documentation

## 2022-05-17 DIAGNOSIS — Z923 Personal history of irradiation: Secondary | ICD-10-CM | POA: Insufficient documentation

## 2022-05-17 DIAGNOSIS — G8929 Other chronic pain: Secondary | ICD-10-CM | POA: Insufficient documentation

## 2022-05-17 DIAGNOSIS — Z9221 Personal history of antineoplastic chemotherapy: Secondary | ICD-10-CM | POA: Diagnosis not present

## 2022-05-17 DIAGNOSIS — Z87891 Personal history of nicotine dependence: Secondary | ICD-10-CM | POA: Diagnosis not present

## 2022-05-17 DIAGNOSIS — E119 Type 2 diabetes mellitus without complications: Secondary | ICD-10-CM | POA: Diagnosis not present

## 2022-05-17 DIAGNOSIS — T17300A Unspecified foreign body in larynx causing asphyxiation, initial encounter: Secondary | ICD-10-CM | POA: Diagnosis not present

## 2022-05-17 DIAGNOSIS — F32A Depression, unspecified: Secondary | ICD-10-CM | POA: Diagnosis not present

## 2022-05-17 DIAGNOSIS — I2510 Atherosclerotic heart disease of native coronary artery without angina pectoris: Secondary | ICD-10-CM | POA: Diagnosis not present

## 2022-05-17 DIAGNOSIS — T18108A Unspecified foreign body in esophagus causing other injury, initial encounter: Secondary | ICD-10-CM | POA: Diagnosis not present

## 2022-05-17 DIAGNOSIS — Z7984 Long term (current) use of oral hypoglycemic drugs: Secondary | ICD-10-CM | POA: Insufficient documentation

## 2022-05-17 DIAGNOSIS — I10 Essential (primary) hypertension: Secondary | ICD-10-CM | POA: Insufficient documentation

## 2022-05-17 DIAGNOSIS — F419 Anxiety disorder, unspecified: Secondary | ICD-10-CM | POA: Diagnosis not present

## 2022-05-17 DIAGNOSIS — K219 Gastro-esophageal reflux disease without esophagitis: Secondary | ICD-10-CM | POA: Insufficient documentation

## 2022-05-17 DIAGNOSIS — J439 Emphysema, unspecified: Secondary | ICD-10-CM | POA: Insufficient documentation

## 2022-05-17 DIAGNOSIS — W44F3XA Food entering into or through a natural orifice, initial encounter: Secondary | ICD-10-CM | POA: Insufficient documentation

## 2022-05-17 DIAGNOSIS — Z8673 Personal history of transient ischemic attack (TIA), and cerebral infarction without residual deficits: Secondary | ICD-10-CM | POA: Diagnosis not present

## 2022-05-17 DIAGNOSIS — T18128A Food in esophagus causing other injury, initial encounter: Secondary | ICD-10-CM | POA: Insufficient documentation

## 2022-05-17 DIAGNOSIS — K56699 Other intestinal obstruction unspecified as to partial versus complete obstruction: Secondary | ICD-10-CM

## 2022-05-17 HISTORY — PX: ESOPHAGOGASTRODUODENOSCOPY: SHX5428

## 2022-05-17 LAB — CBG MONITORING, ED: Glucose-Capillary: 129 mg/dL — ABNORMAL HIGH (ref 70–99)

## 2022-05-17 SURGERY — EGD (ESOPHAGOGASTRODUODENOSCOPY)
Anesthesia: General

## 2022-05-17 SURGERY — ESOPHAGOGASTRODUODENOSCOPY (EGD) WITH PROPOFOL
Anesthesia: General

## 2022-05-17 MED ORDER — PROPOFOL 10 MG/ML IV BOLUS
INTRAVENOUS | Status: AC
  Filled 2022-05-17: qty 20

## 2022-05-17 MED ORDER — LIDOCAINE HCL (CARDIAC) PF 100 MG/5ML IV SOSY
PREFILLED_SYRINGE | INTRAVENOUS | Status: DC | PRN
  Administered 2022-05-17: 80 mg via INTRAVENOUS

## 2022-05-17 MED ORDER — SUGAMMADEX SODIUM 200 MG/2ML IV SOLN
INTRAVENOUS | Status: DC | PRN
  Administered 2022-05-17: 200 mg via INTRAVENOUS

## 2022-05-17 MED ORDER — FENTANYL CITRATE (PF) 100 MCG/2ML IJ SOLN
INTRAMUSCULAR | Status: AC
  Filled 2022-05-17: qty 2

## 2022-05-17 MED ORDER — GLUCAGON HCL RDNA (DIAGNOSTIC) 1 MG IJ SOLR
1.0000 mg | Freq: Once | INTRAMUSCULAR | Status: AC
  Administered 2022-05-17: 1 mg via INTRAVENOUS
  Filled 2022-05-17: qty 1

## 2022-05-17 MED ORDER — ONDANSETRON HCL 4 MG/2ML IJ SOLN
4.0000 mg | Freq: Once | INTRAMUSCULAR | Status: AC
  Administered 2022-05-17: 4 mg via INTRAVENOUS
  Filled 2022-05-17: qty 2

## 2022-05-17 MED ORDER — ROCURONIUM BROMIDE 100 MG/10ML IV SOLN
INTRAVENOUS | Status: DC | PRN
  Administered 2022-05-17: 20 mg via INTRAVENOUS

## 2022-05-17 MED ORDER — SUCCINYLCHOLINE CHLORIDE 200 MG/10ML IV SOSY
PREFILLED_SYRINGE | INTRAVENOUS | Status: DC | PRN
  Administered 2022-05-17: 100 mg via INTRAVENOUS

## 2022-05-17 MED ORDER — FENTANYL CITRATE (PF) 100 MCG/2ML IJ SOLN
INTRAMUSCULAR | Status: DC | PRN
  Administered 2022-05-17 (×2): 50 ug via INTRAVENOUS

## 2022-05-17 MED ORDER — IPRATROPIUM-ALBUTEROL 0.5-2.5 (3) MG/3ML IN SOLN
3.0000 mL | Freq: Once | RESPIRATORY_TRACT | Status: AC
  Administered 2022-05-17: 3 mL via RESPIRATORY_TRACT

## 2022-05-17 MED ORDER — ESMOLOL HCL 100 MG/10ML IV SOLN
INTRAVENOUS | Status: DC | PRN
  Administered 2022-05-17: 20 mg via INTRAVENOUS

## 2022-05-17 MED ORDER — DEXAMETHASONE SODIUM PHOSPHATE 10 MG/ML IJ SOLN
INTRAMUSCULAR | Status: DC | PRN
  Administered 2022-05-17: 10 mg via INTRAVENOUS

## 2022-05-17 MED ORDER — PROPOFOL 10 MG/ML IV BOLUS
INTRAVENOUS | Status: DC | PRN
  Administered 2022-05-17: 150 mg via INTRAVENOUS

## 2022-05-17 MED ORDER — IPRATROPIUM-ALBUTEROL 0.5-2.5 (3) MG/3ML IN SOLN
RESPIRATORY_TRACT | Status: AC
  Filled 2022-05-17: qty 3

## 2022-05-17 MED ORDER — ONDANSETRON HCL 4 MG/2ML IJ SOLN
INTRAMUSCULAR | Status: DC | PRN
  Administered 2022-05-17: 4 mg via INTRAVENOUS

## 2022-05-17 MED ORDER — SODIUM CHLORIDE 0.9 % IV SOLN: INTRAVENOUS | Status: DC

## 2022-05-17 MED ORDER — MORPHINE SULFATE (PF) 4 MG/ML IV SOLN
4.0000 mg | Freq: Once | INTRAVENOUS | Status: AC
  Administered 2022-05-17: 4 mg via INTRAVENOUS
  Filled 2022-05-17: qty 1

## 2022-05-17 NOTE — H&P (Signed)
[name], MD [facility] [address] [place], [place] [address] [phone] Fax : [phone]  Primary Care Physician:  Malva Limes, MD Primary Gastroenterologist:  Dr. Servando Snare  Pre-Procedure History & Physical: HPI:  Yvette Riley is a 67 y.o. female is here for an endoscopy.   Past Medical History:  Diagnosis Date   Allergy    Anemia    Anxiety    Arthritis    Asthma    Blood transfusion without reported diagnosis    C. difficile diarrhea 04/07/2016   CAP (community acquired pneumonia) 03/21/2015   Combined forms of age-related cataract of both eyes 05/06/2016   COPD (chronic obstructive pulmonary disease)    CVA (cerebral vascular accident) 06/17/2018   Diabetes mellitus without complication    Displacement of lumbar intervertebral disc without myelopathy    Emphysema of lung    GERD (gastroesophageal reflux disease)    Glaucoma    History of chicken pox    Hyperlipidemia    Hypertension    Pneumonia 03/29/2015   Sepsis 03/17/2016   Shortness of breath    Small cell lung cancer     Past Surgical History:  Procedure Laterality Date   CESAREAN SECTION     ESOPHAGOGASTRODUODENOSCOPY (EGD) WITH PROPOFOL N/A 02/08/2022   Procedure: ESOPHAGOGASTRODUODENOSCOPY (EGD) WITH PROPOFOL;  Surgeon: Wyline Mood, MD;  Location: [facility] ENDOSCOPY;  Service: Gastroenterology;  Laterality: N/A;   TUBAL LIGATION     UPPER GI ENDOSCOPY      Prior to Admission medications   Medication Sig Start Date End Date Taking? Authorizing Provider  albuterol (PROVENTIL) (2.5 MG/3ML) 0.083% nebulizer solution Take 2.5 mg by nebulization every 6 (six) hours.   Yes [provider]  clopidogrel (PLAVIX) 75 MG tablet Take 1 tablet (75 mg total) by mouth daily. 04/23/21  Yes Malva Limes, MD  diclofenac Sodium (VOLTAREN) 1 % GEL Apply 4 g topically 4 (four) times daily. Patient taking differently: Apply 2 g topically every 4 (four) hours as needed (pain). (Apply to lower  back/back of neck) 06/19/21  Yes Fisher, Demetrios Isaacs, MD  gabapentin (NEURONTIN) 300 MG capsule Take 300 mg by mouth 2 (two) times daily.   Yes [provider]  lidocaine 4 % Place 1 patch onto the skin daily. (Apply to neck and remove after 12 hours)   Yes [provider]  magnesium oxide (MAG-OX) 400 (240 Mg) MG tablet Take 400 mg by mouth daily.   Yes [provider]  metFORMIN (GLUCOPHAGE) 500 MG tablet Take 500 mg by mouth 2 (two) times daily with a meal.   Yes [provider]  metoprolol succinate (TOPROL-XL) 25 MG 24 hr tablet Take 1 tablet (25 mg total) by mouth daily. 06/15/21  Yes Malva Limes, MD  nystatin (MYCOSTATIN/NYSTOP) powder Apply 1 Application topically every 12 (twelve) hours as needed (for fungal rash).   Yes [provider]  omeprazole (PRILOSEC) 40 MG capsule Take 1 capsule (40 mg total) by mouth 2 (two) times daily. Take in the morning and at dinnertime. Patient taking differently: Take 40 mg by mouth in the morning and at bedtime. 02/09/22  Yes Sunnie Nielsen, DO  ondansetron (ZOFRAN) 4 MG tablet Take 4 mg by mouth every 6 (six) hours as needed for nausea. 02/05/22  Yes [provider]  oxyCODONE-acetaminophen (PERCOCET) 10-325 MG tablet TAKE ONE TABLET BY MOUTH EVERY 4 HOURS AS NEEDED (VIAL) Patient taking differently: Take 1 tablet by mouth every 6 (six) hours as  needed for pain. 11/28/21  Yes Malva Limes, MD  polyethylene glycol (MIRALAX / GLYCOLAX) 17 g packet Take 17 g by mouth daily as needed for mild constipation.   Yes [provider]  senna (SENOKOT) 8.6 MG TABS tablet Take 2 tablets by mouth daily. 01/31/22  Yes [provider]  sertraline (ZOLOFT) 50 MG tablet Take 1 tablet (50 mg total) by mouth daily. 06/15/21  Yes Malva Limes, MD  umeclidinium-vilanterol [facility] ELLIPTA) 62.5-25 MCG/ACT AEPB Inhale 1 puff into the lungs daily at 6 (six) AM. 06/15/21  Yes Fisher, Demetrios Isaacs, MD  vitamin  B-12 (CYANOCOBALAMIN) 1000 MCG tablet Take 1 tablet (1,000 mcg total) by mouth daily. 05/23/21  Yes Ostwalt, Edmon Crape, PA-C  zaleplon (SONATA) 5 MG capsule Take 5 mg by mouth at bedtime.   Yes [provider]    Allergies as of 05/17/2022 - Review Complete 05/17/2022  Allergen Reaction Noted   Other Anaphylaxis 04/06/2016   Penicillins Anaphylaxis, Rash, and Other (See Comments) 03/28/2014   Yellow jacket venom [bee venom] Anaphylaxis 03/28/2014   Codeine Hives 03/28/2014   Crestor [rosuvastatin] Nausea And Vomiting 03/28/2014   Gemfibrozil Rash, Nausea And Vomiting, and Swelling 03/28/2014   Trazodone and nefazodone Nausea And Vomiting 10/10/2015   Lipitor [atorvastatin] Nausea Only 03/28/2014    Family History  Problem Relation Age of Onset   Heart failure Mother    Heart disease Mother    Stroke Mother    Heart disease Brother    COPD Brother    Cancer Maternal Aunt        Breast Cancer    Social History   Socioeconomic History   Marital status: Widowed    Spouse name: Not on file   Number of children: 2   Years of education: Not on file   Highest education level: 10th grade  Occupational History   Occupation: disable  Tobacco Use   Smoking status: Former    Packs/day: 0.25    Years: 45.00    Additional pack years: 0.00    Total pack years: 11.25    Types: Cigarettes    Quit date: 03/17/2016    Years since quitting: 6.1   Smokeless tobacco: Never   Tobacco comments:    pt states she quit in 2016-2017  Vaping Use   Vaping Use: Never used  Substance and Sexual Activity   Alcohol use: No    Alcohol/week: 0.0 standard drinks of alcohol   Drug use: No   Sexual activity: Not Currently    Birth control/protection: Abstinence  Other Topics Concern   Not on file  Social History Narrative   Not on file   Social Determinants of Health   Financial Resource Strain: Low Risk  (09/05/2021)   Overall Financial Resource Strain (CARDIA)    Difficulty of Paying  Living Expenses: Not hard at all  Food Insecurity: No Food Insecurity (02/08/2022)   Hunger Vital Sign    Worried About Running Out of Food in the Last Year: Never true    Ran Out of Food in the Last Year: Never true  Transportation Needs: No Transportation Needs (02/08/2022)   PRAPARE - Administrator, Civil Service (Medical): No    Lack of Transportation (Non-Medical): No  Physical Activity: Inactive (09/05/2021)   Exercise Vital Sign    Days of Exercise per Week: 0 days    Minutes of Exercise per Session: 0 min  Stress: Stress Concern Present (09/05/2021)   [employer] of  Occupational Health - Occupational Stress Questionnaire    Feeling of Stress : Very much  Social Connections: Socially Isolated (12/13/2020)   Social Connection and Isolation Panel [NHANES]    Frequency of Communication with Friends and Family: More than three times a week    Frequency of Social Gatherings with Friends and Family: Three times a week    Attends Religious Services: Never    Active Member of Clubs or Organizations: No    Attends Banker Meetings: Never    Marital Status: Widowed  Intimate Partner Violence: Not At Risk (02/08/2022)   Humiliation, Afraid, Rape, and Kick questionnaire    Fear of Current or Ex-Partner: No    Emotionally Abused: No    Physically Abused: No    Sexually Abused: No    Review of Systems: See HPI, otherwise negative ROS  Physical Exam: BP 93/72 (BP Location: Left Arm)   Pulse (!) 101   Temp 98.7 F (37.1 C)   Resp 16   Ht  (1.448 m)   SpO2 96%   BMI 33.01 kg/m  General:   Alert,  pleasant and cooperative in NAD Head:  Normocephalic and atraumatic. Neck:  Supple; no masses or thyromegaly. Lungs:  Clear throughout to auscultation.    Heart:  Regular rate and rhythm. Abdomen:  Soft, nontender and nondistended. Normal bowel sounds, without guarding, and without rebound.   Neurologic:  Alert and  oriented x4;  grossly normal  neurologically.  Impression/Plan: SALVATRICE MORANDI is here for an endoscopy to be performed for food bolus removal  Risks, benefits, limitations, and alternatives regarding  endoscopy have been reviewed with the patient.  Questions have been answered.  All parties agreeable.   [name], MD  05/17/2022, 2:52 PM

## 2022-05-17 NOTE — ED Provider Notes (Signed)
[facility] Provider Note    Event Date/Time   First MD Initiated Contact with Patient 05/17/22 1147     (approximate)   History   Chief Complaint Foreign Body   HPI  Yvette Riley is a 67 y.o. female with past medical history of hypertension, hyperlipidemia, diabetes, CAD, stroke, GERD, and lung cancer who presents to the ED complaining of esophageal foreign body.  Patient reports that she was eating chicken and rice for dinner last night when she felt like some of it got stuck in her throat.  She hoped the discomfort would improve when she woke up this morning, but continues to feel like something is stuck.  She states that she has been unable to keep down either liquids or solids since the onset of symptoms.  She states that she continually has to spit and will vomit when she tries to take sips of water.  She has been unable to take her medications thus far this morning.  She denies significant difficulty breathing.     Physical Exam   Triage Vital Signs: ED Triage Vitals  Enc Vitals Group     BP 05/17/22 1129 93/72     Pulse Rate 05/17/22 1129 (!) 101     Resp 05/17/22 1129 16     Temp 05/17/22 1129 98.7 F (37.1 C)     Temp src --      SpO2 05/17/22 1129 96 %     Weight --      Height 05/17/22 1128  (1.448 m)     Head Circumference --      Peak Flow --      Pain Score 05/17/22 1127 0     Pain Loc --      Pain Edu? --      Excl. in GC? --     Most recent vital signs: Vitals:   05/17/22 1129  BP: 93/72  Pulse: (!) 101  Resp: 16  Temp: 98.7 F (37.1 C)  SpO2: 96%    Constitutional: Alert and oriented. Eyes: Conjunctivae are normal. Head: Atraumatic. Nose: No congestion/rhinnorhea. Mouth/Throat: Mucous membranes are moist.  Posterior oropharynx clear.  Patient continuously spitting. Cardiovascular: Normal rate, regular rhythm. Grossly normal heart sounds.  2+ radial pulses bilaterally. Respiratory: Normal respiratory  effort.  No retractions. Lungs CTAB. Gastrointestinal: Soft and nontender. No distention. Musculoskeletal: No lower extremity tenderness nor edema.  Neurologic:  Normal speech and language. No gross focal neurologic deficits are appreciated.    ED Results / Procedures / Treatments   Labs (all labs ordered are listed, but only abnormal results are displayed) Labs Reviewed - No data to display   PROCEDURES:  Critical Care performed: No  Procedures   MEDICATIONS ORDERED IN ED: Medications  glucagon (human recombinant) (GLUCAGEN) injection 1 mg (1 mg Intravenous Given 05/17/22 1255)  ondansetron (ZOFRAN) injection 4 mg (4 mg Intravenous Given 05/17/22 1305)  morphine (PF) 4 MG/ML injection 4 mg (4 mg Intravenous Given 05/17/22 1309)     IMPRESSION / MDM / ASSESSMENT AND PLAN / ED COURSE  I reviewed the triage vital signs and the nursing notes.                              67 y.o. female with past medical history of hypertension, hyperlipidemia, diabetes, CAD, stroke, GERD, and lung cancer who presents to the ED with esophageal foreign body and difficulty swallowing since  last night.  Patient's presentation is most consistent with acute presentation with potential threat to life or bodily function.  Differential diagnosis includes, but is not limited to, esophageal food bolus, globus sensation, GERD.  Patient nontoxic-appearing and in no acute distress, vital signs are unremarkable.  She is unable to tolerate her oral secretions but no evidence of airway impairment at this time.  She was given IV glucagon and immediately began to vomit afterwards, but only vomited foam and did not have improvement in her symptoms.  She was given Zofran and morphine via IV as she has missed her usual dose of chronic pain medication as well as her other medications.  Case discussed with Dr. Servando Snare of GI, who will take patient for endoscopy.      FINAL CLINICAL IMPRESSION(S) / ED DIAGNOSES   Final  diagnoses:  Food bolus obstruction of intestine     Rx / DC Orders   ED Discharge Orders     None        Note:  This document was prepared using Dragon voice recognition software and may include unintentional dictation errors.   Chesley Noon, MD 05/17/22 [id number]

## 2022-05-17 NOTE — ED Triage Notes (Signed)
Pt to ED via [facility] from [facility] for having food stuck in her throat. Pt states that she was eating last night and her food would not go all the way down. Pt states that she is not able to eat or drink or take her medications this morning. Pt states that she has had issue with this in the past. Pt is able to speak in complete sentences.

## 2022-05-17 NOTE — ED Notes (Signed)
First Nurse Note: Pt to ED via [facility] from [facility] for food stuck in her throat since last night. Pt is A & O x 4. Pt vomited her morning meds due to not being able to get them down.

## 2022-05-17 NOTE — ED Notes (Signed)
Patient had episode of emesis, throwing up white phlegm. Patient states she can "swallow" now.

## 2022-05-17 NOTE — Anesthesia Postprocedure Evaluation (Signed)
Anesthesia Post Note  Patient: Yvette Riley  Procedure(s) Performed: ESOPHAGOGASTRODUODENOSCOPY (EGD)  Patient location during evaluation: Endoscopy Anesthesia Type: General Level of consciousness: awake and alert Pain management: pain level controlled Vital Signs Assessment: post-procedure vital signs reviewed and stable Respiratory status: spontaneous breathing, nonlabored ventilation, respiratory function stable and patient connected to nasal cannula oxygen Cardiovascular status: blood pressure returned to baseline and stable Postop Assessment: no apparent nausea or vomiting Anesthetic complications: yes   Encounter Notable Events  Notable Event Outcome Phase Comment  Difficult to intubate - unexpected  Intraprocedure Filed from anesthesia note documentation.     Last Vitals:  Vitals:   05/17/22 1601 05/17/22 1611  BP: (!) 121/91 113/66  Pulse: (!) 125 (!) 120  Resp: (!) 21 16  Temp:    SpO2: 94% 94%    Last Pain:  Vitals:   05/17/22 1611  TempSrc:   PainSc: 0-No pain                 Louie Boston

## 2022-05-17 NOTE — Anesthesia Procedure Notes (Signed)
Procedure Name: Intubation Date/Time: 05/17/2022 3:22 PM  Performed by: Ginger Carne, CRNAPre-anesthesia Checklist: Patient identified, Emergency Drugs available, Suction available, Patient being monitored and Timeout performed Oxygen Delivery Method: Circle system utilized Preoxygenation: Pre-oxygenation with 100% oxygen Induction Type: IV induction, Rapid sequence and Cricoid Pressure applied Laryngoscope Size: 3 and McGraph Tube type: Oral Tube size: 6.5 mm Number of attempts: 1 Airway Equipment and Method: Stylet and Video-laryngoscopy Placement Confirmation: ETT inserted through vocal cords under direct vision, positive ETCO2 and breath sounds checked- equal and bilateral Secured at: 19 cm Tube secured with: Tape Dental Injury: Teeth and Oropharynx as per pre-operative assessment  Difficulty Due To: Difficulty was unanticipated

## 2022-05-17 NOTE — ED Notes (Signed)
Patient states when she swallows the water "it comes back up." Patient c/o nausea. Dr. Larinda Buttery aware.

## 2022-05-17 NOTE — Transfer of Care (Signed)
Immediate Anesthesia Transfer of Care Note  Patient: Yvette Riley  Procedure(s) Performed: ESOPHAGOGASTRODUODENOSCOPY (EGD)  Patient Location: Endoscopy Unit  Anesthesia Type:General  Level of Consciousness: awake, alert , and oriented  Airway & Oxygen Therapy: Patient Spontanous Breathing  Post-op Assessment: Report given to RN and Post -op Vital signs reviewed and stable  Post vital signs: Reviewed and stable  Last Vitals:  Vitals Value Taken Time  BP 149/100 05/17/22 1551  Temp 36 C 05/17/22 1551  Pulse 130 05/17/22 1553  Resp 16 05/17/22 1553  SpO2 94 % 05/17/22 1553  Vitals shown include unvalidated device data.  Last Pain:  Vitals:   05/17/22 1551  TempSrc: Temporal  PainSc: 0-No pain         Complications:  Encounter Notable Events  Notable Event Outcome Phase Comment  Difficult to intubate - unexpected  Intraprocedure Filed from anesthesia note documentation.

## 2022-05-17 NOTE — ED Notes (Signed)
Patient has a clear voice, occasional congestive cough. Patient appears to be handling her oral secretions well, but states she is not able to eat or drink.

## 2022-05-17 NOTE — Anesthesia Preprocedure Evaluation (Addendum)
Anesthesia Evaluation  Patient identified by MRN, date of birth, ID band Patient awake    Reviewed: Allergy & Precautions, NPO status , Patient's Chart, lab work & pertinent test results  Airway Mallampati: III  TM Distance: >3 FB Neck ROM: full    Dental  (+) Edentulous Lower, Edentulous Upper   Pulmonary shortness of breath, asthma , COPD,  COPD inhaler, former smoker lung cancer in remission status post radiation/chemotherapy   + rhonchi        Cardiovascular hypertension, + CAD  Normal cardiovascular exam     Neuro/Psych  PSYCHIATRIC DISORDERS Anxiety Depression    Chronic pain on chronic opiate therapy  CVA, Residual Symptoms    GI/Hepatic Neg liver ROS,GERD  Medicated,,  Endo/Other  diabetes, Type 2    Renal/GU negative Renal ROS  negative genitourinary   Musculoskeletal   Abdominal  (+) + obese  Peds  Hematology  (+) Blood dyscrasia, anemia   Anesthesia Other Findings Foreign Body Esophagus  Past Medical History: No date: Allergy No date: Anemia No date: Anxiety No date: Arthritis No date: Asthma No date: Blood transfusion without reported diagnosis 04/07/2016: C. difficile diarrhea 03/21/2015: CAP (community acquired pneumonia) 05/06/2016: Combined forms of age-related cataract of both eyes No date: COPD (chronic obstructive pulmonary disease) (HCC) 06/17/2018: CVA (cerebral vascular accident) (HCC) No date: Diabetes mellitus without complication (HCC) No date: Displacement of lumbar intervertebral disc without myelopathy No date: Emphysema of lung (HCC) No date: GERD (gastroesophageal reflux disease) No date: Glaucoma No date: History of chicken pox No date: Hyperlipidemia No date: Hypertension 03/29/2015: Pneumonia 03/17/2016: Sepsis (HCC) No date: Shortness of breath No date: Small cell lung cancer (HCC)  Past Surgical History: No date: CESAREAN SECTION No date: TUBAL LIGATION No date: UPPER GI  ENDOSCOPY  BMI    Body Mass Index: 33.01 kg/m      Reproductive/Obstetrics negative OB ROS                             Anesthesia Physical Anesthesia Plan  ASA: 3  Anesthesia Plan: General   Post-op Pain Management: Minimal or no pain anticipated   Induction: Intravenous and Rapid sequence  PONV Risk Score and Plan: Ondansetron and Treatment may vary due to age or medical condition  Airway Management Planned: Oral ETT  Additional Equipment:   Intra-op Plan:   Post-operative Plan: Extubation in OR  Informed Consent: I have reviewed the patients History and Physical, chart, labs and discussed the procedure including the risks, benefits and alternatives for the proposed anesthesia with the patient or authorized representative who has indicated his/her understanding and acceptance.     Dental Advisory Given  Plan Discussed with: Anesthesiologist, CRNA and Surgeon  Anesthesia Plan Comments: (Pre-op Neb  Patient consented for risks of anesthesia including but not limited to:  - adverse reactions to medications - risk of airway placement if required - damage to eyes, teeth, lips or other oral mucosa - nerve damage due to positioning  - sore throat or hoarseness - Damage to heart, brain, nerves, lungs, other parts of body or loss of life  Patient voiced understanding.)        Anesthesia Quick Evaluation

## 2022-05-20 ENCOUNTER — Encounter: Payer: Self-pay | Admitting: Gastroenterology

## 2022-05-21 DIAGNOSIS — M6281 Muscle weakness (generalized): Secondary | ICD-10-CM | POA: Diagnosis not present

## 2022-05-21 DIAGNOSIS — E559 Vitamin D deficiency, unspecified: Secondary | ICD-10-CM | POA: Diagnosis not present

## 2022-05-21 DIAGNOSIS — R1314 Dysphagia, pharyngoesophageal phase: Secondary | ICD-10-CM | POA: Diagnosis not present

## 2022-05-21 DIAGNOSIS — E119 Type 2 diabetes mellitus without complications: Secondary | ICD-10-CM | POA: Diagnosis not present

## 2022-05-21 DIAGNOSIS — Z79899 Other long term (current) drug therapy: Secondary | ICD-10-CM | POA: Diagnosis not present

## 2022-05-22 DIAGNOSIS — R1314 Dysphagia, pharyngoesophageal phase: Secondary | ICD-10-CM | POA: Diagnosis not present

## 2022-05-22 DIAGNOSIS — M6281 Muscle weakness (generalized): Secondary | ICD-10-CM | POA: Diagnosis not present

## 2022-05-22 NOTE — Op Note (Addendum)
[facility] Gastroenterology Patient Name: Yvette Riley Procedure Date: 05/17/2022 2:40 PM MRN: [id number] Account #: [id number] Date of Birth: [birth year] Admit Type: Emergency Department Age: 67 Room: [facility] ENDO ROOM 3 Gender: Female Note Status: Finalized Instrument Name: Upper Endoscope [id number] [id number] Procedure:             Upper GI endoscopy Indications:           Foreign body in the esophagus Providers:             [name] MD, MD Referring MD:          Demetrios Isaacs. Sherrie Mustache, MD (Referring MD) Medicines:             General Anesthesia Complications:         No immediate complications. Procedure:             Pre-Anesthesia Assessment:                        - Prior to the procedure, a History and Physical was                         performed, and patient medications and allergies were                         reviewed. The patient's tolerance of previous                         anesthesia was also reviewed. The risks and benefits                         of the procedure and the sedation options and risks                         were discussed with the patient. All questions were                         answered, and informed consent was obtained. Prior                         Anticoagulants: The patient has taken no anticoagulant                         or antiplatelet agents. ASA Grade Assessment: II - A                         patient with mild systemic disease. After reviewing                         the risks and benefits, the patient was deemed in                         satisfactory condition to undergo the procedure.                        After obtaining informed consent, the endoscope was                         passed under direct vision. Throughout the procedure,  the patient's blood pressure, pulse, and oxygen                         saturations were monitored continuously. The Endoscope                         was introduced through  the mouth, and advanced to the                         second part of duodenum. The upper GI endoscopy was                         accomplished without difficulty. The patient tolerated                         the procedure well. Findings:      Food was found in the upper third of the esophagus. Removal was       accomplished with a Tripod.      The stomach was normal.      The examined duodenum was normal. No biopsies or other specimens were       collected for this exam. Impression:            - Food in the upper third of the esophagus. Removal                         was successful.                        - Normal stomach.                        - Normal examined duodenum. Recommendation:        - Discharge patient to home.                        - Mechanical soft diet.                        - Continue present medications.                        - Repeat upper endoscopy in 1 month for retreatment                         with Dr. Tobi Bastos. Procedure Code(s):     --- Professional ---                        [phone], Esophagogastroduodenoscopy, flexible,                         transoral; with removal of foreign body(s) Diagnosis Code(s):     --- Professional ---                        T18.108A, Unspecified foreign body in esophagus                         causing other injury, initial encounter CPT copyright 2022 American Medical Association. All rights reserved. The codes documented in this report are preliminary  and upon coder review may  be revised to meet current compliance requirements. [name] MD, MD 05/17/2022 3:43:48 PM This report has been signed electronically. Number of Addenda: 0 Note Initiated On: 05/17/2022 2:40 PM Estimated Blood Loss:  Estimated blood loss: none.      [facility]

## 2022-05-23 DIAGNOSIS — M6281 Muscle weakness (generalized): Secondary | ICD-10-CM | POA: Diagnosis not present

## 2022-05-23 DIAGNOSIS — R1314 Dysphagia, pharyngoesophageal phase: Secondary | ICD-10-CM | POA: Diagnosis not present

## 2022-05-24 DIAGNOSIS — R21 Rash and other nonspecific skin eruption: Secondary | ICD-10-CM | POA: Diagnosis not present

## 2022-05-24 DIAGNOSIS — R1314 Dysphagia, pharyngoesophageal phase: Secondary | ICD-10-CM | POA: Diagnosis not present

## 2022-05-24 DIAGNOSIS — M6281 Muscle weakness (generalized): Secondary | ICD-10-CM | POA: Diagnosis not present

## 2022-05-27 DIAGNOSIS — M6281 Muscle weakness (generalized): Secondary | ICD-10-CM | POA: Diagnosis not present

## 2022-05-27 DIAGNOSIS — R1314 Dysphagia, pharyngoesophageal phase: Secondary | ICD-10-CM | POA: Diagnosis not present

## 2022-05-28 DIAGNOSIS — M6281 Muscle weakness (generalized): Secondary | ICD-10-CM | POA: Diagnosis not present

## 2022-05-28 DIAGNOSIS — R1314 Dysphagia, pharyngoesophageal phase: Secondary | ICD-10-CM | POA: Diagnosis not present

## 2022-05-29 DIAGNOSIS — E612 Magnesium deficiency: Secondary | ICD-10-CM | POA: Diagnosis not present

## 2022-05-30 DIAGNOSIS — R1314 Dysphagia, pharyngoesophageal phase: Secondary | ICD-10-CM | POA: Diagnosis not present

## 2022-05-31 DIAGNOSIS — R1314 Dysphagia, pharyngoesophageal phase: Secondary | ICD-10-CM | POA: Diagnosis not present

## 2022-06-02 DIAGNOSIS — R1314 Dysphagia, pharyngoesophageal phase: Secondary | ICD-10-CM | POA: Diagnosis not present

## 2022-06-03 DIAGNOSIS — R1314 Dysphagia, pharyngoesophageal phase: Secondary | ICD-10-CM | POA: Diagnosis not present

## 2022-06-04 DIAGNOSIS — R1314 Dysphagia, pharyngoesophageal phase: Secondary | ICD-10-CM | POA: Diagnosis not present

## 2022-06-05 DIAGNOSIS — R1314 Dysphagia, pharyngoesophageal phase: Secondary | ICD-10-CM | POA: Diagnosis not present

## 2022-06-06 DIAGNOSIS — G8929 Other chronic pain: Secondary | ICD-10-CM | POA: Diagnosis not present

## 2022-06-06 DIAGNOSIS — R1314 Dysphagia, pharyngoesophageal phase: Secondary | ICD-10-CM | POA: Diagnosis not present

## 2022-06-07 ENCOUNTER — Inpatient Hospital Stay: Payer: 59 | Attending: Oncology | Admitting: Oncology

## 2022-06-07 ENCOUNTER — Encounter: Payer: Self-pay | Admitting: Oncology

## 2022-06-07 VITALS — BP 120/82 | HR 103 | Temp 97.9°F | Resp 18 | Ht <= 58 in | Wt 168.0 lb

## 2022-06-07 DIAGNOSIS — Z08 Encounter for follow-up examination after completed treatment for malignant neoplasm: Secondary | ICD-10-CM | POA: Insufficient documentation

## 2022-06-07 DIAGNOSIS — Z87891 Personal history of nicotine dependence: Secondary | ICD-10-CM | POA: Insufficient documentation

## 2022-06-07 DIAGNOSIS — C3491 Malignant neoplasm of unspecified part of right bronchus or lung: Secondary | ICD-10-CM | POA: Diagnosis not present

## 2022-06-07 DIAGNOSIS — Z85118 Personal history of other malignant neoplasm of bronchus and lung: Secondary | ICD-10-CM | POA: Insufficient documentation

## 2022-06-07 NOTE — Progress Notes (Signed)
[facility]  [phone] [phone]  ID: Yvette Riley OB: 07-04-1955  MR#: [id number]  WUX#:[id number]  Patient Care Team: Malva Limes, MD as PCP - General (Family Medicine) Irene Limbo., MD (Ophthalmology) Lonell Face, MD as Consulting Physician (Neurology) Laurier Nancy, MD as Consulting Physician (Cardiology) Jeralyn Ruths, MD as Consulting Physician (Oncology) Carmina Miller, MD as Referring Physician (Radiation Oncology)  CHIEF COMPLAINT: Clinical stage IIIB small cell lung cancer of the right lung.  INTERVAL HISTORY: Patient last seen in clinic in August 2023 at which point she was discharged after completing 5 years of surveillance of the above-stated malignancy.  Patient noticed increasing cough as well as declining performance status and is concerned her malignancy has returned and presented for further evaluation.  She now resides in a nursing home.  She has chronic weakness and fatigue.  She has no neurologic complaints today.  She denies any recent fevers or illnesses.  She has a fair appetite and denies weight loss.  She denies any chest pain, cough, or hemoptysis.  She has chronic shortness of breath.  She denies any nausea, vomiting, constipation, or diarrhea.  She denies any melena or hematochezia.  She has no urinary complaints.  Patient offers no further specific complaints today.  REVIEW OF SYSTEMS:   Review of Systems  Constitutional:  Positive for malaise/fatigue. Negative for fever and weight loss.  Respiratory:  Positive for shortness of breath. Negative for cough and hemoptysis.   Cardiovascular: Negative.  Negative for chest pain and leg swelling.  Gastrointestinal: Negative.  Negative for abdominal pain and constipation.  Genitourinary: Negative.  Negative for dysuria.  Musculoskeletal: Negative.  Negative for back pain and myalgias.  Skin: Negative.  Negative for rash.  Neurological:  Positive for  weakness. Negative for sensory change, focal weakness and headaches.  Psychiatric/Behavioral: Negative.  The patient is not nervous/anxious.     As per HPI. Otherwise, a complete review of systems is negative.  PAST MEDICAL HISTORY: Past Medical History:  Diagnosis Date   Allergy    Anemia    Anxiety    Arthritis    Asthma    Blood transfusion without reported diagnosis    C. difficile diarrhea 04/07/2016   CAP (community acquired pneumonia) 03/21/2015   Combined forms of age-related cataract of both eyes 05/06/2016   COPD (chronic obstructive pulmonary disease) (HCC)    CVA (cerebral vascular accident) (HCC) 06/17/2018   Diabetes mellitus without complication (HCC)    Displacement of lumbar intervertebral disc without myelopathy    Emphysema of lung (HCC)    GERD (gastroesophageal reflux disease)    Glaucoma    History of chicken pox    Hyperlipidemia    Hypertension    Pneumonia 03/29/2015   Sepsis (HCC) 03/17/2016   Shortness of breath    Small cell lung cancer (HCC)     PAST SURGICAL HISTORY: Past Surgical History:  Procedure Laterality Date   CESAREAN SECTION     ESOPHAGOGASTRODUODENOSCOPY N/A 05/17/2022   Procedure: ESOPHAGOGASTRODUODENOSCOPY (EGD);  Surgeon: [name], MD;  Location: [facility] ENDOSCOPY;  Service: Endoscopy;  Laterality: N/A;   ESOPHAGOGASTRODUODENOSCOPY (EGD) WITH PROPOFOL N/A 02/08/2022   Procedure: ESOPHAGOGASTRODUODENOSCOPY (EGD) WITH PROPOFOL;  Surgeon: Wyline Mood, MD;  Location: [facility] ENDOSCOPY;  Service: Gastroenterology;  Laterality: N/A;   TUBAL LIGATION     UPPER GI ENDOSCOPY      FAMILY HISTORY: Family History  Problem Relation Age of Onset   Heart failure Mother  Heart disease Mother    Stroke Mother    Heart disease Brother    COPD Brother    Cancer Maternal Aunt        Breast Cancer    ADVANCED DIRECTIVES (Y/N):  N  HEALTH MAINTENANCE: Social History   Tobacco Use   Smoking status: Former    Packs/day: 0.25    Years: 45.00     Additional pack years: 0.00    Total pack years: 11.25    Types: Cigarettes    Quit date: 03/17/2016    Years since quitting: 6.2   Smokeless tobacco: Never   Tobacco comments:    pt states she quit in 2016-2017  Vaping Use   Vaping Use: Never used  Substance Use Topics   Alcohol use: No    Alcohol/week: 0.0 standard drinks of alcohol   Drug use: No     Colonoscopy:  PAP:  Bone density:  Lipid panel:  Allergies  Allergen Reactions   Other Anaphylaxis   Penicillins Anaphylaxis, Rash and Other (See Comments)    Reaction:  Tongue swelling  Has patient had a PCN reaction causing immediate rash, facial/tongue/throat swelling, SOB or lightheadedness with hypotension:  Yes   Has patient had a PCN reaction causing severe rash involving mucus membranes or skin necrosis: No Has patient had a PCN reaction that required hospitalization No Has patient had a PCN reaction occurring within the last 10 years: No If all of the above answers are "NO", then may proceed with Cephalosporin use.   Yellow Jacket Venom [Bee Venom] Anaphylaxis   Codeine Hives   Crestor [Rosuvastatin] Nausea And Vomiting    Corrected prior adverse reaction per [facility] [employer]. On 12/02/3011  patient reported N & V when she takes Crestor   Gemfibrozil Rash, Nausea And Vomiting and Swelling   Trazodone And Nefazodone Nausea And Vomiting   Lipitor [Atorvastatin] Nausea Only    By patient report 12/02/2011. Had also been prescribed pravastatin and lovastatin by previous MD. Unclear if those caused same side effects.     Current Outpatient Medications  Medication Sig Dispense Refill   albuterol (PROVENTIL) (2.5 MG/3ML) 0.083% nebulizer solution Take 2.5 mg by nebulization every 6 (six) hours.     clopidogrel (PLAVIX) 75 MG tablet Take 1 tablet (75 mg total) by mouth daily. 90 tablet 4   diclofenac Sodium (VOLTAREN) 1 % GEL Apply 4 g topically 4 (four) times daily. (Patient taking differently: Apply 2 g topically  every 4 (four) hours as needed (pain). (Apply to lower back/back of neck)) 350 g 3   gabapentin (NEURONTIN) 300 MG capsule Take 300 mg by mouth 2 (two) times daily.     lidocaine 4 % Place 1 patch onto the skin daily. (Apply to neck and remove after 12 hours)     magnesium oxide (MAG-OX) 400 (240 Mg) MG tablet Take 400 mg by mouth daily.     metFORMIN (GLUCOPHAGE) 500 MG tablet Take 500 mg by mouth 2 (two) times daily with a meal.     metoprolol succinate (TOPROL-XL) 25 MG 24 hr tablet Take 1 tablet (25 mg total) by mouth daily. 90 tablet 3   nystatin (MYCOSTATIN/NYSTOP) powder Apply 1 Application topically every 12 (twelve) hours as needed (for fungal rash).     omeprazole (PRILOSEC) 40 MG capsule Take 1 capsule (40 mg total) by mouth 2 (two) times daily. Take in the morning and at dinnertime. (Patient taking differently: Take 40 mg by mouth in the morning and  at bedtime.) 120 capsule 0   ondansetron (ZOFRAN) 4 MG tablet Take 4 mg by mouth every 6 (six) hours as needed for nausea.     oxyCODONE-acetaminophen (PERCOCET) 10-325 MG tablet TAKE ONE TABLET BY MOUTH EVERY 4 HOURS AS NEEDED (VIAL) (Patient taking differently: Take 1 tablet by mouth every 6 (six) hours as needed for pain.) 180 tablet 0   polyethylene glycol (MIRALAX / GLYCOLAX) 17 g packet Take 17 g by mouth daily as needed for mild constipation.     senna (SENOKOT) 8.6 MG TABS tablet Take 2 tablets by mouth daily.     sertraline (ZOLOFT) 50 MG tablet Take 1 tablet (50 mg total) by mouth daily. 90 tablet 4   umeclidinium-vilanterol (ANORO ELLIPTA) 62.5-25 MCG/ACT AEPB Inhale 1 puff into the lungs daily at 6 (six) AM. 3 each 4   vitamin B-12 (CYANOCOBALAMIN) 1000 MCG tablet Take 1 tablet (1,000 mcg total) by mouth daily. 30 tablet 0   zaleplon (SONATA) 5 MG capsule Take 5 mg by mouth at bedtime.     montelukast (SINGULAIR) 10 MG tablet Take 10 mg by mouth at bedtime.     No current facility-administered medications for this visit.     OBJECTIVE: Vitals:   06/07/22 1028  BP: 120/82  Pulse: (!) 103  Resp: 18  Temp: 97.9 F (36.6 C)  SpO2: 95%     Body mass index is 36.35 kg/m.    ECOG FS:2 - Symptomatic, <50% confined to bed  General: Well-developed, well-nourished, no acute distress.  Sitting in a wheelchair. Eyes: Pink conjunctiva, anicteric sclera. HEENT: Normocephalic, moist mucous membranes. Lungs: No audible wheezing or coughing. Heart: Regular rate and rhythm. Abdomen: Soft, nontender, no obvious distention. Musculoskeletal: No edema, cyanosis, or clubbing. Neuro: Alert, answering all questions appropriately. Cranial nerves grossly intact. Skin: No rashes or petechiae noted. Psych: Normal affect.  LAB RESULTS:  Lab Results  Component Value Date   NA 140 02/09/2022   K 3.6 02/09/2022   CL 112 (H) 02/09/2022   CO2 20 (L) 02/09/2022   GLUCOSE 115 (H) 02/09/2022   BUN 12 02/09/2022   CREATININE 0.57 02/09/2022   CALCIUM 8.0 (L) 02/09/2022   PROT 7.3 02/08/2022   ALBUMIN 4.2 02/08/2022   AST 15 02/08/2022   ALT 11 02/08/2022   ALKPHOS 61 02/08/2022   BILITOT 1.4 (H) 02/08/2022   GFRNONAA >60 02/09/2022   GFRAA 102 09/07/2019    Lab Results  Component Value Date   WBC 8.7 02/08/2022   NEUTROABS 10.1 (H) 02/07/2022   HGB 12.1 02/08/2022   HCT 38.6 02/08/2022   MCV 90.8 02/08/2022   PLT 196 02/08/2022     STUDIES: No results found.   ASSESSMENT: Clinical stage IIIB small cell lung cancer of the right lung.  PLAN:    Clinical stage IIIB small cell lung cancer of the right lung: Patient completed concurrent chemotherapy and XRT at outside facility and then transferred care to [facility] to complete treatment and long-term monitoring for recurrence. She completed cycle 6 of carboplatinum and etoposide on August 15, 2016.  She also completed PCI.  Her most recent imaging on September 06, 2021 reviewed independently with no evidence of recurrent or progressive disease.  Given patient's new onset  symptoms of cough, will repeat CT of the chest in the next 1 to 2 weeks.  Patient was also given a referral to pulmonary.  If CT is negative, follow-up with pulmonary as directed and no follow-up is necessary in the cancer  center.  If CT is positive or suspicious, will arrange follow-up after her imaging to discuss the results.  No follow-up has been scheduled at this time. Cough/shortness of breath: Referral to pulmonary as above.   Weakness and fatigue/declining performance status: Likely multifactorial.  Patient is now in a nursing home.  I spent a total of 30 minutes reviewing chart data, face-to-face evaluation with the patient, counseling and coordination of care as detailed above.  Patient expressed understanding and was in agreement with this plan. She also understands that She can call clinic at any time with any questions, concerns, or complaints.    Cancer Staging  Small cell lung cancer, right [facility]) Staging form: Lung, AJCC 8th Edition - Clinical stage from 08/18/2016: Stage IIIB (cT2a, cN3, cM0) - Signed by Jeralyn Ruths, MD on 08/18/2016 Laterality: Right   Jeralyn Ruths, MD   06/07/2022 10:51 AM

## 2022-06-07 NOTE — Progress Notes (Signed)
Patient states she called to make a follow up with Dr. Orlie Dakin due to increased SOB. She is worried that her cancer has grown back and would like to have some imaging done.

## 2022-06-09 DIAGNOSIS — R1314 Dysphagia, pharyngoesophageal phase: Secondary | ICD-10-CM | POA: Diagnosis not present

## 2022-06-10 DIAGNOSIS — R1314 Dysphagia, pharyngoesophageal phase: Secondary | ICD-10-CM | POA: Diagnosis not present

## 2022-06-18 ENCOUNTER — Encounter: Payer: Self-pay | Admitting: Student in an Organized Health Care Education/Training Program

## 2022-06-20 ENCOUNTER — Ambulatory Visit
Admission: RE | Admit: 2022-06-20 | Discharge: 2022-06-20 | Disposition: A | Discharge status: Home / Self Care | Payer: 59 | Source: Ambulatory Visit | Attending: Oncology | Admitting: Oncology

## 2022-06-20 DIAGNOSIS — C3491 Malignant neoplasm of unspecified part of right bronchus or lung: Secondary | ICD-10-CM | POA: Insufficient documentation

## 2022-06-20 DIAGNOSIS — R918 Other nonspecific abnormal finding of lung field: Secondary | ICD-10-CM | POA: Diagnosis not present

## 2022-06-20 DIAGNOSIS — C3490 Malignant neoplasm of unspecified part of unspecified bronchus or lung: Secondary | ICD-10-CM | POA: Diagnosis not present

## 2022-06-20 DIAGNOSIS — G4700 Insomnia, unspecified: Secondary | ICD-10-CM | POA: Diagnosis not present

## 2022-06-20 LAB — POCT I-STAT CREATININE: Creatinine, Ser: 0.8 mg/dL (ref 0.44–1.00)

## 2022-06-20 MED ORDER — IOHEXOL 300 MG/ML  SOLN
80.0000 mL | Freq: Once | INTRAMUSCULAR | Status: AC | PRN
  Administered 2022-06-20: 75 mL via INTRAVENOUS

## 2022-06-26 ENCOUNTER — Telehealth: Payer: Self-pay | Admitting: *Deleted

## 2022-06-26 NOTE — Telephone Encounter (Signed)
Call placed to patient to follow up on CT results, CT looks good at this time per Dr. Orlie Dakin. Patient does not require follow up at the cancer center at this time but does need to follow with pulmonary. Referral was placed to [facility] on 5/10. Follow up call placed to [place] to update that patient is at [facility] and that all appointments would need to be scheduled through [facility]. Pulmonary office given contact number for [facility] and will follow up.   Patient verbalized understanding and is in agreement with plan.

## 2022-07-01 DIAGNOSIS — K219 Gastro-esophageal reflux disease without esophagitis: Secondary | ICD-10-CM | POA: Diagnosis not present

## 2022-07-01 DIAGNOSIS — R1310 Dysphagia, unspecified: Secondary | ICD-10-CM | POA: Diagnosis not present

## 2022-07-03 DIAGNOSIS — L0291 Cutaneous abscess, unspecified: Secondary | ICD-10-CM | POA: Diagnosis not present

## 2022-07-08 DIAGNOSIS — L0291 Cutaneous abscess, unspecified: Secondary | ICD-10-CM | POA: Diagnosis not present

## 2022-07-09 DIAGNOSIS — J449 Chronic obstructive pulmonary disease, unspecified: Secondary | ICD-10-CM | POA: Diagnosis not present

## 2022-07-09 DIAGNOSIS — L98499 Non-pressure chronic ulcer of skin of other sites with unspecified severity: Secondary | ICD-10-CM | POA: Diagnosis not present

## 2022-07-09 DIAGNOSIS — I10 Essential (primary) hypertension: Secondary | ICD-10-CM | POA: Diagnosis not present

## 2022-07-09 DIAGNOSIS — E119 Type 2 diabetes mellitus without complications: Secondary | ICD-10-CM | POA: Diagnosis not present

## 2022-07-10 ENCOUNTER — Other Ambulatory Visit: Payer: Self-pay

## 2022-07-10 ENCOUNTER — Emergency Department: Payer: 59

## 2022-07-10 ENCOUNTER — Encounter
Admission: EM | Disposition: A | Discharge status: Nursing Home (Includes ICF) | Payer: Self-pay | Source: Home / Self Care | Attending: Emergency Medicine

## 2022-07-10 ENCOUNTER — Ambulatory Visit
Admission: EM | Admit: 2022-07-10 | Discharge: 2022-07-10 | Disposition: A | Discharge status: Other Acute Inpatient Hospital | Payer: 59 | Attending: Emergency Medicine | Admitting: Emergency Medicine

## 2022-07-10 ENCOUNTER — Ambulatory Visit: Payer: 59 | Admitting: Certified Registered"

## 2022-07-10 ENCOUNTER — Encounter: Payer: Self-pay | Admitting: Emergency Medicine

## 2022-07-10 DIAGNOSIS — Z8673 Personal history of transient ischemic attack (TIA), and cerebral infarction without residual deficits: Secondary | ICD-10-CM | POA: Insufficient documentation

## 2022-07-10 DIAGNOSIS — L0291 Cutaneous abscess, unspecified: Secondary | ICD-10-CM | POA: Diagnosis not present

## 2022-07-10 DIAGNOSIS — J4489 Other specified chronic obstructive pulmonary disease: Secondary | ICD-10-CM | POA: Diagnosis not present

## 2022-07-10 DIAGNOSIS — Z87891 Personal history of nicotine dependence: Secondary | ICD-10-CM | POA: Diagnosis not present

## 2022-07-10 DIAGNOSIS — W44F3XA Food entering into or through a natural orifice, initial encounter: Secondary | ICD-10-CM | POA: Insufficient documentation

## 2022-07-10 DIAGNOSIS — E119 Type 2 diabetes mellitus without complications: Secondary | ICD-10-CM | POA: Diagnosis not present

## 2022-07-10 DIAGNOSIS — R279 Unspecified lack of coordination: Secondary | ICD-10-CM | POA: Diagnosis not present

## 2022-07-10 DIAGNOSIS — K449 Diaphragmatic hernia without obstruction or gangrene: Secondary | ICD-10-CM | POA: Insufficient documentation

## 2022-07-10 DIAGNOSIS — I2510 Atherosclerotic heart disease of native coronary artery without angina pectoris: Secondary | ICD-10-CM | POA: Insufficient documentation

## 2022-07-10 DIAGNOSIS — I10 Essential (primary) hypertension: Secondary | ICD-10-CM | POA: Diagnosis not present

## 2022-07-10 DIAGNOSIS — J449 Chronic obstructive pulmonary disease, unspecified: Secondary | ICD-10-CM | POA: Diagnosis not present

## 2022-07-10 DIAGNOSIS — I6930 Unspecified sequelae of cerebral infarction: Secondary | ICD-10-CM | POA: Diagnosis not present

## 2022-07-10 DIAGNOSIS — K219 Gastro-esophageal reflux disease without esophagitis: Secondary | ICD-10-CM | POA: Insufficient documentation

## 2022-07-10 DIAGNOSIS — Z836 Family history of other diseases of the respiratory system: Secondary | ICD-10-CM | POA: Diagnosis not present

## 2022-07-10 DIAGNOSIS — F32A Depression, unspecified: Secondary | ICD-10-CM | POA: Diagnosis not present

## 2022-07-10 DIAGNOSIS — R1310 Dysphagia, unspecified: Secondary | ICD-10-CM | POA: Diagnosis not present

## 2022-07-10 DIAGNOSIS — Z823 Family history of stroke: Secondary | ICD-10-CM | POA: Diagnosis not present

## 2022-07-10 DIAGNOSIS — R1110 Vomiting, unspecified: Secondary | ICD-10-CM | POA: Diagnosis not present

## 2022-07-10 DIAGNOSIS — Z8249 Family history of ischemic heart disease and other diseases of the circulatory system: Secondary | ICD-10-CM | POA: Insufficient documentation

## 2022-07-10 DIAGNOSIS — T18128A Food in esophagus causing other injury, initial encounter: Secondary | ICD-10-CM | POA: Diagnosis not present

## 2022-07-10 DIAGNOSIS — J439 Emphysema, unspecified: Secondary | ICD-10-CM | POA: Insufficient documentation

## 2022-07-10 DIAGNOSIS — Z8711 Personal history of peptic ulcer disease: Secondary | ICD-10-CM | POA: Diagnosis not present

## 2022-07-10 DIAGNOSIS — Z743 Need for continuous supervision: Secondary | ICD-10-CM | POA: Diagnosis not present

## 2022-07-10 DIAGNOSIS — K222 Esophageal obstruction: Secondary | ICD-10-CM | POA: Diagnosis not present

## 2022-07-10 DIAGNOSIS — A4902 Methicillin resistant Staphylococcus aureus infection, unspecified site: Secondary | ICD-10-CM | POA: Diagnosis not present

## 2022-07-10 HISTORY — PX: ESOPHAGOGASTRODUODENOSCOPY: SHX5428

## 2022-07-10 LAB — COMPREHENSIVE METABOLIC PANEL
ALT: 14 U/L (ref 0–44)
AST: 29 U/L (ref 15–41)
Albumin: 5 g/dL (ref 3.5–5.0)
Alkaline Phosphatase: 83 U/L (ref 38–126)
Anion gap: 14 (ref 5–15)
BUN: 15 mg/dL (ref 8–23)
CO2: 25 mmol/L (ref 22–32)
Calcium: 9.5 mg/dL (ref 8.9–10.3)
Chloride: 98 mmol/L (ref 98–111)
Creatinine, Ser: 0.76 mg/dL (ref 0.44–1.00)
GFR, Estimated: >60 mL/min (ref >60)
Glucose, Bld: 132 mg/dL — ABNORMAL HIGH (ref 70–99)
Potassium: 4.4 mmol/L (ref 3.5–5.1)
Sodium: 137 mmol/L (ref 135–145)
Total Bilirubin: 1 mg/dL (ref 0.3–1.2)
Total Protein: 8.5 g/dL — ABNORMAL HIGH (ref 6.5–8.1)

## 2022-07-10 LAB — CBC WITH DIFFERENTIAL/PLATELET
Abs Immature Granulocytes: 0.04 10*3/uL (ref 0.00–0.07)
Basophils Absolute: 0 10*3/uL (ref 0.0–0.1)
Basophils Relative: 0 %
Eosinophils Absolute: 0.1 10*3/uL (ref 0.0–0.5)
Eosinophils Relative: 1 %
HCT: 40.9 % (ref 36.0–46.0)
Hemoglobin: 13 g/dL (ref 12.0–15.0)
Immature Granulocytes: 0 %
Lymphocytes Relative: 11 %
Lymphs Abs: 1 10*3/uL (ref 0.7–4.0)
MCH: 27.4 pg (ref 26.0–34.0)
MCHC: 31.8 g/dL (ref 30.0–36.0)
MCV: 86.1 fL (ref 80.0–100.0)
Monocytes Absolute: 0.3 10*3/uL (ref 0.1–1.0)
Monocytes Relative: 3 %
Neutro Abs: 7.5 10*3/uL (ref 1.7–7.7)
Neutrophils Relative %: 85 %
Platelets: 284 10*3/uL (ref 150–400)
RBC: 4.75 MIL/uL (ref 3.87–5.11)
RDW: 13.7 % (ref 11.5–15.5)
WBC: 9 10*3/uL (ref 4.0–10.5)
nRBC: 0 % (ref 0.0–0.2)

## 2022-07-10 LAB — LIPASE, BLOOD: Lipase: 31 U/L (ref 11–51)

## 2022-07-10 LAB — CBG MONITORING, ED: Glucose-Capillary: 124 mg/dL — ABNORMAL HIGH (ref 70–99)

## 2022-07-10 SURGERY — EGD (ESOPHAGOGASTRODUODENOSCOPY)
Anesthesia: General

## 2022-07-10 MED ORDER — LIDOCAINE HCL (CARDIAC) PF 100 MG/5ML IV SOSY
PREFILLED_SYRINGE | INTRAVENOUS | Status: DC | PRN
  Administered 2022-07-10: 100 mg via INTRAVENOUS

## 2022-07-10 MED ORDER — GLUCAGON HCL RDNA (DIAGNOSTIC) 1 MG IJ SOLR
1.0000 mg | Freq: Once | INTRAMUSCULAR | Status: AC
  Administered 2022-07-10: 1 mg via INTRAVENOUS
  Filled 2022-07-10: qty 1

## 2022-07-10 MED ORDER — PROPOFOL 10 MG/ML IV BOLUS
INTRAVENOUS | Status: AC
  Filled 2022-07-10: qty 20

## 2022-07-10 MED ORDER — SUCCINYLCHOLINE CHLORIDE 200 MG/10ML IV SOSY
PREFILLED_SYRINGE | INTRAVENOUS | Status: DC | PRN
  Administered 2022-07-10: 100 mg via INTRAVENOUS

## 2022-07-10 MED ORDER — DEXMEDETOMIDINE HCL IN NACL 200 MCG/50ML IV SOLN
INTRAVENOUS | Status: DC | PRN
  Administered 2022-07-10: 8 ug via INTRAVENOUS

## 2022-07-10 MED ORDER — FENTANYL CITRATE (PF) 100 MCG/2ML IJ SOLN
INTRAMUSCULAR | Status: AC
  Filled 2022-07-10: qty 2

## 2022-07-10 MED ORDER — LACTATED RINGERS IV BOLUS
1000.0000 mL | Freq: Once | INTRAVENOUS | Status: AC
  Administered 2022-07-10: 1000 mL via INTRAVENOUS

## 2022-07-10 MED ORDER — MORPHINE SULFATE (PF) 4 MG/ML IV SOLN
4.0000 mg | Freq: Once | INTRAVENOUS | Status: AC
  Administered 2022-07-10: 4 mg via INTRAVENOUS
  Filled 2022-07-10: qty 1

## 2022-07-10 MED ORDER — ESMOLOL HCL 100 MG/10ML IV SOLN
INTRAVENOUS | Status: DC | PRN
  Administered 2022-07-10: 50 mg via INTRAVENOUS
  Administered 2022-07-10: 20 mg via INTRAVENOUS

## 2022-07-10 MED ORDER — FENTANYL CITRATE (PF) 100 MCG/2ML IJ SOLN
INTRAMUSCULAR | Status: DC | PRN
  Administered 2022-07-10 (×2): 50 ug via INTRAVENOUS

## 2022-07-10 MED ORDER — PROPOFOL 10 MG/ML IV BOLUS
INTRAVENOUS | Status: DC | PRN
  Administered 2022-07-10: 120 mg via INTRAVENOUS

## 2022-07-10 MED ORDER — ONDANSETRON HCL 4 MG/2ML IJ SOLN
INTRAMUSCULAR | Status: DC | PRN
  Administered 2022-07-10: 4 mg via INTRAVENOUS

## 2022-07-10 MED ORDER — DEXAMETHASONE SODIUM PHOSPHATE 10 MG/ML IJ SOLN
INTRAMUSCULAR | Status: DC | PRN
  Administered 2022-07-10: 5 mg via INTRAVENOUS

## 2022-07-10 MED ORDER — ONDANSETRON HCL 4 MG/2ML IJ SOLN
4.0000 mg | Freq: Once | INTRAMUSCULAR | Status: AC
  Administered 2022-07-10: 4 mg via INTRAVENOUS
  Filled 2022-07-10: qty 2

## 2022-07-10 MED ORDER — SODIUM CHLORIDE 0.9 % IV SOLN: INTRAVENOUS | Status: DC

## 2022-07-10 MED ORDER — SUCCINYLCHOLINE CHLORIDE 200 MG/10ML IV SOSY
PREFILLED_SYRINGE | INTRAVENOUS | Status: AC
  Filled 2022-07-10: qty 10

## 2022-07-10 NOTE — ED Triage Notes (Signed)
Pt via [facility] from [facility] c/o difficulty swallowing x 2 days. Pt has been unable to take PO meds and cannot eat or drink due to inability to swallow. Pt has hx multiple strokes and neuropathy; ambulatory with walker. A/O x 4. VSS per EMS

## 2022-07-10 NOTE — Anesthesia Preprocedure Evaluation (Signed)
Anesthesia Evaluation  Patient identified by MRN, date of birth, ID band Patient awake    Reviewed: Allergy & Precautions, NPO status , Patient's Chart, lab work & pertinent test results  History of Anesthesia Complications Negative for: history of anesthetic complications  Airway Mallampati: III  TM Distance: >3 FB Neck ROM: full    Dental  (+) Edentulous Lower, Edentulous Upper   Pulmonary shortness of breath, asthma , neg sleep apnea, COPD,  COPD inhaler, Patient abstained from smoking.Not current smoker, former smoker lung cancer in remission status post radiation/chemotherapy. Breathing feels at baseline today. Has chronic cough. Room air SpO2 = 98%    + decreased breath sounds      Cardiovascular Exercise Tolerance: Good METShypertension, Pt. on medications + CAD  (-) Past MI Normal cardiovascular exam(-) dysrhythmias  Rhythm:Regular Rate:Normal - Systolic murmurs    Neuro/Psych  PSYCHIATRIC DISORDERS Anxiety Depression    Chronic pain on chronic opiate therapy  CVA, Residual Symptoms    GI/Hepatic Neg liver ROS,GERD  Medicated,,  Endo/Other  diabetes, Type 2    Renal/GU negative Renal ROS  negative genitourinary   Musculoskeletal   Abdominal  (+) + obese  Peds  Hematology  (+) Blood dyscrasia, anemia   Anesthesia Other Findings Foreign Body Esophagus  Past Medical History: No date: Allergy No date: Anemia No date: Anxiety No date: Arthritis No date: Asthma No date: Blood transfusion without reported diagnosis 04/07/2016: C. difficile diarrhea 03/21/2015: CAP (community acquired pneumonia) 05/06/2016: Combined forms of age-related cataract of both eyes No date: COPD (chronic obstructive pulmonary disease) (HCC) 06/17/2018: CVA (cerebral vascular accident) (HCC) No date: Diabetes mellitus without complication (HCC) No date: Displacement of lumbar intervertebral disc without myelopathy No date: Emphysema of  lung (HCC) No date: GERD (gastroesophageal reflux disease) No date: Glaucoma No date: History of chicken pox No date: Hyperlipidemia No date: Hypertension 03/29/2015: Pneumonia 03/17/2016: Sepsis (HCC) No date: Shortness of breath No date: Small cell lung cancer (HCC)  Past Surgical History: No date: CESAREAN SECTION No date: TUBAL LIGATION No date: UPPER GI ENDOSCOPY  BMI    Body Mass Index: 33.01 kg/m      Reproductive/Obstetrics negative OB ROS                             Anesthesia Physical Anesthesia Plan  ASA: 3 and emergent  Anesthesia Plan: General   Post-op Pain Management: Minimal or no pain anticipated   Induction: Intravenous and Rapid sequence  PONV Risk Score and Plan: 3 and Ondansetron and Treatment may vary due to age or medical condition  Airway Management Planned: Oral ETT and Video Laryngoscope Planned  Additional Equipment: None  Intra-op Plan:   Post-operative Plan: Extubation in OR  Informed Consent: I have reviewed the patients History and Physical, chart, labs and discussed the procedure including the risks, benefits and alternatives for the proposed anesthesia with the patient or authorized representative who has indicated his/her understanding and acceptance.     Dental Advisory Given  Plan Discussed with: Anesthesiologist, CRNA and Surgeon  Anesthesia Plan Comments: (Discussed risks of anesthesia with patient, including PONV, sore throat, lip/dental/eye damage. Rare risks discussed as well, such as cardiorespiratory and neurological sequelae, aspiration, and allergic reactions. Discussed the role of CRNA in patient's perioperative care. Patient understands.)        Anesthesia Quick Evaluation

## 2022-07-10 NOTE — Transfer of Care (Signed)
Immediate Anesthesia Transfer of Care Note  Patient: Yvette Riley  Procedure(s) Performed: Procedure(s): ESOPHAGOGASTRODUODENOSCOPY (EGD) (N/A)  Patient Location: PACU and Endoscopy Unit  Anesthesia Type:General  Level of Consciousness: sedated  Airway & Oxygen Therapy: Patient Spontanous Breathing and Patient connected to nasal cannula oxygen  Post-op Assessment: Report given to RN and Post -op Vital signs reviewed and stable  Post vital signs: Reviewed and stable  Last Vitals:  Vitals:   07/10/22 1939 07/10/22 2014  BP:  (!) 130/90  Pulse:  (!) 106  Resp:  16  Temp:  (!) 35.8 C  SpO2: 98% 100%    Complications: No apparent anesthesia complications

## 2022-07-10 NOTE — Progress Notes (Signed)
Patient for discharge after EGD and removal of food impaction.  Trying to call [facility] to give report and have pick up but no answer after several attempts. ED in Surg so Called 911 for assistance with getting in touch with [facility]. 911 notified [facility] to call us in Endo. No family available. Nurse that called me back says the van doesn't run at night so called for EMS transport back to [facility].

## 2022-07-10 NOTE — ED Provider Notes (Signed)
[facility] Provider Note    Event Date/Time   First MD Initiated Contact with Patient 07/10/22 1625     (approximate)   History   Chief Complaint Dysphagia   HPI  Yvette Riley is a 67 y.o. female with past medical history of diabetes, stroke, COPD, anemia, CAD, and lung cancer who presents to the ED complaining of dysphagia.  Patient reports that she has been unable to swallow either liquids or solids since yesterday morning, feels like there is something stuck in her throat.  She states she dealt with this once previously and had to have endoscopy to have food removed.  She reports feeling fine after dinner 2 nights ago, but when she woke up in the morning she did try to drink some water and it came right back up.  Ever since then, she has had vomiting after liquids, solids, or her medication.  She points to the middle of her neck where she feels like there is an obstruction.  She denies any difficulty breathing.      Physical Exam   Triage Vital Signs: ED Triage Vitals [07/10/22 1559]  Enc Vitals Group     BP (!) 136/110     Pulse Rate (!) 111     Resp 18     Temp 98.4 F (36.9 C)     Temp Source Oral     SpO2 92 %     Weight 175 lb (79.4 kg)     Height 4\' 9"  (1.448 m)     Head Circumference      Peak Flow      Pain Score 5     Pain Loc      Pain Edu?      Excl. in GC?     Most recent vital signs: Vitals:   07/10/22 1559  BP: (!) 136/110  Pulse: (!) 111  Resp: 18  Temp: 98.4 F (36.9 C)  SpO2: 92%    Constitutional: Alert and oriented. Eyes: Conjunctivae are normal. Head: Atraumatic. Nose: No congestion/rhinnorhea. Mouth/Throat: Mucous membranes are moist.  Posterior oropharynx clear. Cardiovascular: Normal rate, regular rhythm. Grossly normal heart sounds.  2+ radial pulses bilaterally. Respiratory: Normal respiratory effort.  No retractions. Lungs CTAB. Gastrointestinal: Soft and nontender. No  distention. Musculoskeletal: No lower extremity tenderness nor edema.  Neurologic:  Normal speech and language. No gross focal neurologic deficits are appreciated.    ED Results / Procedures / Treatments   Labs (all labs ordered are listed, but only abnormal results are displayed) Labs Reviewed  COMPREHENSIVE METABOLIC PANEL - Abnormal; Notable for the following components:      Result Value   Glucose, Bld 132 (*)    Total Protein 8.5 (*)    All other components within normal limits  CBC WITH DIFFERENTIAL/PLATELET  LIPASE, BLOOD    RADIOLOGY Soft tissue neck x-ray reviewed and interpreted by me with no foreign body noted.  PROCEDURES:  Critical Care performed: No  Procedures   MEDICATIONS ORDERED IN ED: Medications  glucagon (human recombinant) (GLUCAGEN) injection 1 mg (1 mg Intravenous Given 07/10/22 1756)  morphine (PF) 4 MG/ML injection 4 mg (4 mg Intravenous Given 07/10/22 1800)  ondansetron (ZOFRAN) injection 4 mg (4 mg Intravenous Given 07/10/22 1759)  lactated ringers bolus 1,000 mL (1,000 mLs Intravenous New Bag/Given 07/10/22 1758)     IMPRESSION / MDM / ASSESSMENT AND PLAN / ED COURSE  I reviewed the triage vital signs and the nursing notes.  67 y.o. female with past medical history of diabetes, stroke, COPD, anemia, CAD, and lung cancer who presents to the ED with inability to swallow and feeling like there is something stuck in her throat since yesterday morning.  Patient's presentation is most consistent with acute presentation with potential threat to life or bodily function.  Differential diagnosis includes, but is not limited to, impacted food bolus, dysphagia, dehydration, electrolyte abnormality, AKI.  Patient nontoxic-appearing and in no acute distress, vital signs remarkable for tachycardia but otherwise reassuring.  On record review, patient was seen for similar symptoms 2 months ago, found to have impacted food bolus in  her upper esophagus at that time.  She describes current symptoms as exactly the same, has now been dealing with this for about 36 hours.  Given extended time period we will screen labs, trial dose of IV glucagon and also give IV morphine and Zofran for pain and nausea.  Plan to discuss with GI if no improvement.  Labs are unremarkable with no significant anemia, leukocytosis, lecture abnormality, or AKI.  LFTs and lipase are also unremarkable.  Patient continues to frequently spit and feel like something is stuck in her throat despite IV glucagon.  Case discussed with Dr. Norma Fredrickson of GI, who will take patient for endoscopy.      FINAL CLINICAL IMPRESSION(S) / ED DIAGNOSES   Final diagnoses:  Esophageal obstruction due to food impaction     Rx / DC Orders   ED Discharge Orders     None        Note:  This document was prepared using Dragon voice recognition software and may include unintentional dictation errors.   Chesley Noon, MD 07/10/22 [phone]

## 2022-07-10 NOTE — Anesthesia Postprocedure Evaluation (Signed)
Anesthesia Post Note  Patient: Yvette Riley  Procedure(s) Performed: ESOPHAGOGASTRODUODENOSCOPY (EGD)  Patient location during evaluation: Endoscopy Anesthesia Type: General Level of consciousness: awake and alert Pain management: pain level controlled Vital Signs Assessment: post-procedure vital signs reviewed and stable Respiratory status: spontaneous breathing, nonlabored ventilation, respiratory function stable and patient connected to nasal cannula oxygen Cardiovascular status: blood pressure returned to baseline and stable Postop Assessment: no apparent nausea or vomiting Anesthetic complications: no   No notable events documented.   Last Vitals:  Vitals:   07/10/22 2054 07/10/22 2104  BP: 114/79 125/80  Pulse: (!) 108 (!) 110  Resp: 13 14  Temp:    SpO2: 94% 94%    Last Pain:  Vitals:   07/10/22 2034  TempSrc:   PainSc: 0-No pain                 Corinda Gubler

## 2022-07-10 NOTE — ED Provider Triage Note (Signed)
Emergency Medicine Provider Triage Evaluation Note  Yvette Riley , a 67 y.o. female  was evaluated in triage.  Pt complains of difficulty swallowing. Patient has 3-4 days of symptoms. Also complaining of body aches. Hx of having FB get stuck in esophagus. Last occurrence was in April of this year  Review of Systems  Positive: Unable to swallow Negative: Fever, congestion, cough, emesis  Physical Exam  BP (!) 136/110 (BP Location: Left Arm)   Pulse (!) 111   Temp 98.4 F (36.9 C) (Oral)   Resp 18   Ht 4\' 9"  (1.448 m)   Wt 79.4 kg   SpO2 92%   BMI 37.87 kg/m  Gen:   Awake, no distress   Resp:  Normal effort  MSK:   Moves extremities without difficulty  Other:    Medical Decision Making  Medically screening exam initiated at 4:01 PM.  Appropriate orders placed.  Yvette Riley was informed that the remainder of the evaluation will be completed by another provider, this initial triage assessment does not replace that evaluation, and the importance of remaining in the ED until their evaluation is complete.  Soft tissue xray. Will try meds/swallow test when roomed   Racheal Patches, PA-C 07/10/22 1601

## 2022-07-10 NOTE — Op Note (Signed)
[facility] Patient Name: Yvette Riley Procedure Date: 07/10/2022 7:35 PM MRN: [id number] Account #: [id number] Date of Birth: [birth year] Admit Type: Outpatient Age: 67 Room: [facility] ENDO ROOM 1 Gender: Female Note Status: Finalized Instrument Name: Upper Endoscope [id number] Procedure:             Upper GI endoscopy Indications:           Dysphagia, Foreign body in the esophagus Providers:             Boykin Nearing. Erasmo Vertz MD, MD Medicines:             Propofol per Anesthesia, General Anesthesia Complications:         No immediate complications. Estimated blood loss: None. Procedure:             Pre-Anesthesia Assessment:                        - The risks and benefits of the procedure and the                         sedation options and risks were discussed with the                         patient. All questions were answered and informed                         consent was obtained.                        - Patient identification and proposed procedure were                         verified prior to the procedure by the nurse. The                         procedure was verified in the procedure room.                        - ASA Grade Assessment: III - A patient with severe                         systemic disease.                        - After reviewing the risks and benefits, the patient                         was deemed in satisfactory condition to undergo the                         procedure.                        After obtaining informed consent, the endoscope was                         passed under direct vision. Throughout the procedure,                         the patient's blood pressure, pulse, and oxygen  saturations were monitored continuously. The                         Endosonoscope was introduced through the mouth, and                         advanced to the third part of duodenum. The upper GI                          endoscopy was somewhat difficult due to presence of                         foreign body. Successful completion of the procedure                         was aided by performing the maneuvers documented                         (below) in this report. The patient tolerated the                         procedure well. Findings:      Food was found in the upper third of the esophagus, 27 cm from the       incisors. Removal was accomplished with a Tripod. The foreign body       removal site was examined following endoscope reinsertion and showed       congestion, erosion and erythema. No biopsies or other specimens were       collected for this exam.      The exam of the esophagus was otherwise normal.      A 2 cm hiatal hernia was present.      The exam of the stomach was otherwise normal.      The examined duodenum was normal. Impression:            - Food was found in the esophagus. Removal was                         successful.                        - 2 cm hiatal hernia.                        - Normal examined duodenum. Recommendation:        - Patient has a contact number available for                         emergencies. The signs and symptoms of potential                         delayed complications were discussed with the patient.                         Return to normal activities tomorrow. Written                         discharge instructions were provided to the patient.                        -  Pureed diet for the rest of the patient's life.                        - Continue present medications.                        - Return to Dr. Wyline Mood in 2 months.                        - Call [facility] for appointment [phone].                        Continue Omeprazole.                        - The findings and recommendations were discussed with                         the patient. Stanton Kidney MD, MD 07/10/2022 8:08:11 PM This report has been signed  electronically. Number of Addenda: 0 Note Initiated On: 07/10/2022 7:35 PM Estimated Blood Loss:  Estimated blood loss: none.      [facility]

## 2022-07-10 NOTE — Anesthesia Procedure Notes (Signed)
Procedure Name: Intubation Date/Time: 07/10/2022 7:52 PM  Performed by: Stormy Fabian, CRNAPre-anesthesia Checklist: Patient identified, Patient being monitored, Timeout performed, Emergency Drugs available and Suction available Patient Re-evaluated:Patient Re-evaluated prior to induction Oxygen Delivery Method: Circle system utilized Preoxygenation: Pre-oxygenation with 100% oxygen Induction Type: IV induction and Rapid sequence Ventilation: Mask ventilation without difficulty Laryngoscope Size: Mac, 3 and 4 Grade View: Grade I Tube type: Oral Tube size: 6.5 mm Number of attempts: 1 Airway Equipment and Method: Stylet Placement Confirmation: ETT inserted through vocal cords under direct vision, positive ETCO2 and breath sounds checked- equal and bilateral Secured at: 20 cm Tube secured with: Tape Dental Injury: Teeth and Oropharynx as per pre-operative assessment

## 2022-07-10 NOTE — Consult Note (Signed)
[facility] Inpatient Consult Note   Jamey Reas, M.D.  Reason for Consult: Esophageal food impaction, dysphagia   Attending Requesting Consult: Chesley Noon, M.D. (ED)   History of Present Illness: Yvette Riley is a 67 y.o. female who was unable to drink water yesterday after eating a breakfast consisting of eggs and grits.  She said she was unable to tolerate the meal because it "would not go down".  She tried to chase it with water which was unsuccessful resulting in her vomiting part of the eggs and grits.  For the remainder of the day yesterday and all day today she has been unable to hold down her secretions or any liquids she has attempted to ingest.  She denies any hematemesis, abdominal pain, syncope or presyncopal episodes.  She has had a history of food impaction in the past and has seen Dr. Tobi Bastos and Dr. Servando Snare for urgent upper endoscopies.  She has been placed on omeprazole which she says has "made me sick".  She is completely edentulous and has dentures, but does not wear them all the time when she eats.  She admits to eating a regular diet that is not pured and or restricted in any way in terms of consistency. Patient denies any change in bowel habits hematochezia or weight loss.  She takes Plavix for history of cerebrovascular disease and stroke.  She does not recall the timing of her last dose of Plavix. Patient was seen in the emergency room by Dr. Chesley Noon who witnessed patient spitting up into a bag.  He attempted 1 mg IV glucagon to no avail.  He then called gastroenterology to perform urgent endoscopy for presumed foreign body in the esophagus with food impaction.  Past Medical History:  Past Medical History:  Diagnosis Date   Allergy    Anemia    Anxiety    Arthritis    Asthma    Blood transfusion without reported diagnosis    C. difficile diarrhea 04/07/2016   CAP (community acquired pneumonia) 03/21/2015   Combined forms of age-related  cataract of both eyes 05/06/2016   COPD (chronic obstructive pulmonary disease) (HCC)    CVA (cerebral vascular accident) (HCC) 06/17/2018   Diabetes mellitus without complication (HCC)    Displacement of lumbar intervertebral disc without myelopathy    Emphysema of lung (HCC)    GERD (gastroesophageal reflux disease)    Glaucoma    History of chicken pox    Hyperlipidemia    Hypertension    Pneumonia 03/29/2015   Sepsis (HCC) 03/17/2016   Shortness of breath    Small cell lung cancer (HCC)     Problem List: Patient Active Problem List   Diagnosis Date Noted   Food bolus obstruction of intestine (HCC) 05/17/2022   Dysphagia 02/07/2022   Vertebral artery stenosis, right 02/07/2022   SIRS (systemic inflammatory response syndrome) (HCC) 02/07/2022   Nausea & vomiting 02/07/2022   Abnormal red cell volume 05/16/2021   B12 deficiency 01/26/2021   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) 05/18/2019   History of COPD 05/18/2019   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   Chronic, continuous use of opioids 08/05/2018   History of cerebellar CVA with residual deficit 06/17/2018   Chronic cough 06/02/2018   Anxiety 07/19/2016   Small cell lung cancer, right (HCC) 05/13/2016   Bilateral presbyopia 05/06/2016   Combined forms of age-related cataract of both eyes 05/06/2016   Dry eye syndrome of both lacrimal  glands 05/06/2016   Chemoprophylaxis 04/29/2016   Severe obesity (BMI 35.0-39.9) with comorbidity (HCC) 04/16/2016   Weakness generalized 04/06/2016   Mass of upper lobe of right lung 03/01/2016   Coronary atherosclerosis 02/29/2016   Allergic rhinitis 09/27/2014   Asthma 09/27/2014   Anemia 09/27/2014   Hyperglyceridemia, pure 09/27/2014   Glaucoma 09/27/2014   Hip pain 09/27/2014   Right knee pain 09/27/2014   Displacement of lumbar intervertebral disc without myelopathy 09/27/2014   Thoracic back pain 09/27/2014   Insomnia 08/11/2014   COPD (chronic  obstructive pulmonary disease) (HCC) 06/12/2014   Type 2 diabetes mellitus (HCC) 06/12/2014   Back pain 06/12/2014   Smoking greater than 30 pack years 03/28/2014    Past Surgical History: Past Surgical History:  Procedure Laterality Date   CESAREAN SECTION     ESOPHAGOGASTRODUODENOSCOPY N/A 05/17/2022   Procedure: ESOPHAGOGASTRODUODENOSCOPY (EGD);  Surgeon: [name], MD;  Location: [facility] ENDOSCOPY;  Service: Endoscopy;  Laterality: N/A;   ESOPHAGOGASTRODUODENOSCOPY (EGD) WITH PROPOFOL N/A 02/08/2022   Procedure: ESOPHAGOGASTRODUODENOSCOPY (EGD) WITH PROPOFOL;  Surgeon: Wyline Mood, MD;  Location: [facility] ENDOSCOPY;  Service: Gastroenterology;  Laterality: N/A;   TUBAL LIGATION     UPPER GI ENDOSCOPY      Allergies: Allergies  Allergen Reactions   Other Anaphylaxis   Penicillins Anaphylaxis, Rash and Other (See Comments)    Reaction:  Tongue swelling  Has patient had a PCN reaction causing immediate rash, facial/tongue/throat swelling, SOB or lightheadedness with hypotension:  Yes   Has patient had a PCN reaction causing severe rash involving mucus membranes or skin necrosis: No Has patient had a PCN reaction that required hospitalization No Has patient had a PCN reaction occurring within the last 10 years: No If all of the above answers are "NO", then may proceed with Cephalosporin use.   Yellow Jacket Venom [Bee Venom] Anaphylaxis   Codeine Hives   Crestor [Rosuvastatin] Nausea And Vomiting    Corrected prior adverse reaction per [facility] [employer]. On 12/02/3011  patient reported N & V when she takes Crestor   Gemfibrozil Rash, Nausea And Vomiting and Swelling   Trazodone And Nefazodone Nausea And Vomiting   Lipitor [Atorvastatin] Nausea Only    By patient report 12/02/2011. Had also been prescribed pravastatin and lovastatin by previous MD. Unclear if those caused same side effects.     Home Medications: (Not in a hospital admission)  Home medication reconciliation was  completed with the patient.   Scheduled Inpatient Medications:    Continuous Inpatient Infusions:    PRN Inpatient Medications:    Family History: family history includes COPD in her brother; Cancer in her maternal aunt; Heart disease in her brother and mother; Heart failure in her mother; Stroke in her mother.   GI Family History: Negative  Social History:   reports that she quit smoking about 6 years ago. Her smoking use included cigarettes. She has a 11.25 pack-year smoking history. She has never used smokeless tobacco. She reports that she does not drink alcohol and does not use drugs. The patient denies ETOH, tobacco, or drug use.    Review of Systems: Review of Systems - Negative except that in history of present illness  Physical Examination: BP (!) 136/110 (BP Location: Left Arm)   Pulse (!) 111   Temp 98.4 F (36.9 C) (Oral)   Resp 18   Ht 4\' 9"  (1.448 m)   Wt 79.4 kg   SpO2 92%   BMI 37.87 kg/m  Physical Exam Constitutional:  General: She is not in acute distress.    Appearance: Normal appearance. She is obese. She is not toxic-appearing.  HENT:     Head: Normocephalic and atraumatic.     Nose: Nose normal.     Mouth/Throat:     Mouth: Mucous membranes are moist.     Pharynx: Oropharynx is clear.  Eyes:     General: No scleral icterus.    Conjunctiva/sclera: Conjunctivae normal.     Pupils: Pupils are equal, round, and reactive to light.  Cardiovascular:     Rate and Rhythm: Normal rate.     Pulses: Normal pulses.  Pulmonary:     Effort: Pulmonary effort is normal.     Breath sounds: Normal breath sounds. No wheezing or rales.  Abdominal:     General: Bowel sounds are normal. There is no distension.     Palpations: Abdomen is soft. There is no mass.     Tenderness: There is no abdominal tenderness. There is no guarding.  Musculoskeletal:        General: No swelling.  Skin:    General: Skin is warm and dry.  Neurological:     General: No  focal deficit present.     Mental Status: She is alert and oriented to person, place, and time.  Psychiatric:        Mood and Affect: Mood normal.        Thought Content: Thought content normal.        Judgment: Judgment normal.     Data: Lab Results  Component Value Date   WBC 9.0 07/10/2022   HGB 13.0 07/10/2022   HCT 40.9 07/10/2022   MCV 86.1 07/10/2022   PLT 284 07/10/2022   Recent Labs  Lab 07/10/22 1645  HGB 13.0   Lab Results  Component Value Date   NA 137 07/10/2022   K 4.4 07/10/2022   CL 98 07/10/2022   CO2 25 07/10/2022   BUN 15 07/10/2022   CREATININE 0.76 07/10/2022   Lab Results  Component Value Date   ALT 14 07/10/2022   AST 29 07/10/2022   ALKPHOS 83 07/10/2022   BILITOT 1.0 07/10/2022   No results for input(s): "APTT", "INR", "PTT" in the last 168 hours.    Latest Ref Rng & Units 07/10/2022    4:45 PM 02/08/2022    4:00 AM 02/07/2022    1:34 PM  CBC  WBC 4.0 - 10.5 K/uL 9.0  8.7  12.0   Hemoglobin 12.0 - 15.0 g/dL 56.2  13.0  86.5   Hematocrit 36.0 - 46.0 % 40.9  38.6  43.7   Platelets 150 - 400 K/uL 284  196  259     STUDIES: DG Neck Soft Tissue  Result Date: 07/10/2022 CLINICAL DATA:  Foreign body sensation. Difficulty swallowing for 2 days. EXAM: NECK SOFT TISSUES - 2 VIEW COMPARISON:  X-ray 04/02/2017.  Neck CT 02/07/2022 FINDINGS: Loss of normal cervical lordosis with multifocal degenerative changes greatest at C5-6. Osteopenia. Preserved prevertebral soft tissues and epiglottis. No soft tissue gas and thickening. Curvature of the cervical spine. Opacity seen at the right upper lung at the edge of the imaging field. Please correlate with prior CT 02/07/2022. patient is edentulous. IMPRESSION: No specific soft tissue gas or thickening by x-ray in the neck. No definite radiopaque foreign body. Further workup with cross-sectional imaging as clinically appropriate. Electronically Signed   By: Karen Kays M.D.   On: 07/10/2022 16:38    @IMAGES @  Assessment:  1.  Presumed esophageal food impaction with dysphagia. 2.  History of GERD on omeprazole. 3.  History of esophageal ulcer. 4.  Edentulous with probable poor mastication resulting in large food boluses in the esophagus. 5.  Noncompliant with wearing dentures.    Recommendations: 1.  Urgent endoscopy with endotracheal intubation/general anesthesia-The patient understands the nature of the planned procedure, indications, risks, alternatives and potential complications including but not limited to bleeding, infection, perforation, damage to internal organs and possible oversedation/side effects from anesthesia. The patient agrees and gives consent to proceed.  Please refer to procedure notes for findings, recommendations and patient disposition/instructions.   Thank you for the consult. Please call with questions or concerns.  Rosina Lowenstein, "Mellody Dance MD [facility] [address] [place], [place] [address] [phone]  07/10/2022 7:21 PM

## 2022-07-11 ENCOUNTER — Encounter: Payer: Self-pay | Admitting: Internal Medicine

## 2022-07-19 ENCOUNTER — Telehealth: Payer: Self-pay | Admitting: Student in an Organized Health Care Education/Training Program

## 2022-07-19 ENCOUNTER — Ambulatory Visit (INDEPENDENT_AMBULATORY_CARE_PROVIDER_SITE_OTHER): Payer: 59 | Admitting: Student in an Organized Health Care Education/Training Program

## 2022-07-19 ENCOUNTER — Encounter: Payer: Self-pay | Admitting: Student in an Organized Health Care Education/Training Program

## 2022-07-19 ENCOUNTER — Ambulatory Visit: Payer: 59

## 2022-07-19 VITALS — BP 124/72 | HR 118 | Temp 97.7°F | Ht <= 58 in | Wt 177.6 lb

## 2022-07-19 DIAGNOSIS — J42 Unspecified chronic bronchitis: Secondary | ICD-10-CM | POA: Diagnosis not present

## 2022-07-19 DIAGNOSIS — J4540 Moderate persistent asthma, uncomplicated: Secondary | ICD-10-CM

## 2022-07-19 DIAGNOSIS — G4733 Obstructive sleep apnea (adult) (pediatric): Secondary | ICD-10-CM

## 2022-07-19 MED ORDER — TRELEGY ELLIPTA 200-62.5-25 MCG/ACT IN AEPB
1.0000 | INHALATION_SPRAY | Freq: Every day | RESPIRATORY_TRACT | 12 refills | Status: AC
Start: 2022-07-19 — End: ?

## 2022-07-19 NOTE — Telephone Encounter (Signed)
I have spoke with Yvette Riley and answered her questions about the HST machine

## 2022-07-19 NOTE — Telephone Encounter (Signed)
Trelegy was ordered Fluticasone-Umeclidin-Vilant (TRELEGY ELLIPTA) 200-62.5-25 MCG/ACT AEPB. That should have routed to her pharmacy.  As to the home sleep test, Synetta Fail showed the patient and her aid how to use the test. Adding Delray Alt and Synetta Fail to the thread for input.   Thanks, habib

## 2022-07-19 NOTE — Telephone Encounter (Signed)
Pt's care home is calling. She returned with the HST box with just pictures as "directions" They need to know when and for how long she should use this HST machine.  Also, see AVS ppwk. States to pick up Trelegy but it does not say dosage and how many times a day.  [employer] Contact: Davetta She is the [employer].  [phone]

## 2022-07-19 NOTE — Progress Notes (Signed)
Synopsis: Referred in for cough by Jeralyn Ruths, MD  Assessment & Plan:   1. Moderate persistent asthma without complication 2. Chronic bronchitis, unspecified chronic bronchitis type (HCC)  Presenting for the evaluation of cough, wheeze, and shortness of breath in the setting of small cell lung cancer treated with chemoradiation as well as history of COPD and asthma.  I suspect that the patient has COPD/asthma overlap syndrome compounded by the effects of her malignancy as well as radiation changes.  I have reviewed her CT scan which shows radiation changes in the right upper lobe and hilar region without other lung disease.  She is wheezy on exam today.  For COPD/asthma overlap syndrome, I will switch her from Anoro Ellipta to Trelegy Ellipta adding high-dose ICS to her regimen.  Her max eosinophil count was 400 in the past and I suspect she would benefit from being on inhaled corticosteroids.  She is also maintained on montelukast.  I will obtain a pulmonary function test to assess the degree of obstruction as well as reversibility.  I will also evaluate lung volumes and DLCO in the same test.  Finally, given her concern for sleep apnea, body habitus, and snoring, I will order a home sleep study to assess for obstructive sleep apnea.  -PFT's -switch Anoro to Trelegy -Home sleep study   Return in about 3 months (around 10/19/2022).  I spent 60 minutes caring for this patient today, including preparing to see the patient, obtaining a medical history , reviewing a separately obtained history, performing a medically appropriate examination and/or evaluation, counseling and educating the patient/family/caregiver, ordering medications, tests, or procedures, documenting clinical information in the electronic health record, and independently interpreting results (not separately reported/billed) and communicating results to the patient/family/caregiver  Raechel Chute, MD [facility]  Critical [facility] 07/19/2022 9:01 AM    End of visit medications:  Meds ordered this encounter  Medications   Fluticasone-Umeclidin-Vilant (TRELEGY ELLIPTA) 200-62.5-25 MCG/ACT AEPB    Sig: Inhale 1 puff into the lungs daily.    Dispense:  30 each    Refill:  12     Current Outpatient Medications:    albuterol (PROVENTIL) (2.5 MG/3ML) 0.083% nebulizer solution, Take 2.5 mg by nebulization every 6 (six) hours., Disp: , Rfl:    clopidogrel (PLAVIX) 75 MG tablet, Take 1 tablet (75 mg total) by mouth daily., Disp: 90 tablet, Rfl: 4   diclofenac Sodium (VOLTAREN) 1 % GEL, Apply 4 g topically 4 (four) times daily. (Patient taking differently: Apply 2 g topically every 4 (four) hours as needed (pain). (Apply to lower back/back of neck)), Disp: 350 g, Rfl: 3   Fluticasone-Umeclidin-Vilant (TRELEGY ELLIPTA) 200-62.5-25 MCG/ACT AEPB, Inhale 1 puff into the lungs daily., Disp: 30 each, Rfl: 12   gabapentin (NEURONTIN) 300 MG capsule, Take 300 mg by mouth 2 (two) times daily., Disp: , Rfl:    lidocaine 4 %, Place 1 patch onto the skin daily. (Apply to neck and remove after 12 hours), Disp: , Rfl:    magnesium oxide (MAG-OX) 400 (240 Mg) MG tablet, Take 400 mg by mouth daily., Disp: , Rfl:    metFORMIN (GLUCOPHAGE) 500 MG tablet, Take 500 mg by mouth 2 (two) times daily with a meal., Disp: , Rfl:    metoprolol succinate (TOPROL-XL) 25 MG 24 hr tablet, Take 1 tablet (25 mg total) by mouth daily., Disp: 90 tablet, Rfl: 3   montelukast (SINGULAIR) 10 MG tablet, Take 10 mg by mouth at bedtime., Disp: , Rfl:  nystatin (MYCOSTATIN/NYSTOP) powder, Apply 1 Application topically every 12 (twelve) hours as needed (for fungal rash)., Disp: , Rfl:    omeprazole (PRILOSEC) 40 MG capsule, Take 1 capsule (40 mg total) by mouth 2 (two) times daily. Take in the morning and at dinnertime. (Patient taking differently: Take 40 mg by mouth in the morning and at bedtime.), Disp: 120 capsule, Rfl: 0   ondansetron (ZOFRAN) 4 MG  tablet, Take 4 mg by mouth every 6 (six) hours as needed for nausea., Disp: , Rfl:    oxyCODONE-acetaminophen (PERCOCET) 10-325 MG tablet, TAKE ONE TABLET BY MOUTH EVERY 4 HOURS AS NEEDED (VIAL) (Patient taking differently: Take 1 tablet by mouth every 6 (six) hours as needed for pain.), Disp: 180 tablet, Rfl: 0   polyethylene glycol (MIRALAX / GLYCOLAX) 17 g packet, Take 17 g by mouth daily as needed for mild constipation., Disp: , Rfl:    senna (SENOKOT) 8.6 MG TABS tablet, Take 2 tablets by mouth daily., Disp: , Rfl:    sertraline (ZOLOFT) 50 MG tablet, Take 1 tablet (50 mg total) by mouth daily., Disp: 90 tablet, Rfl: 4   vitamin B-12 (CYANOCOBALAMIN) 1000 MCG tablet, Take 1 tablet (1,000 mcg total) by mouth daily., Disp: 30 tablet, Rfl: 0   zaleplon (SONATA) 5 MG capsule, Take 5 mg by mouth at bedtime., Disp: , Rfl:    Subjective:   PATIENT ID: Yvette Riley GENDER: female DOB: 1955-08-07, MRN: [id number]  Chief Complaint  Patient presents with   pulmonary consult    CT 5/29-SOB with exertion, dry cough and wheezing.     HPI  Patient is a 67 year old female presenting to clinic for the evaluation of cough and shortness of breath.  She has a history of small cell lung cancer previously treated with chemoradiation, now in remission, followed by Dr. Orlie Dakin from oncology.  She is referred to [place] for the evaluation of cough and shortness of breath.  She reports chronic symptoms that have been persistent, she was previously told she has asthma as well as COPD.  She tells me that she was diagnosed with asthma as a child and and had been on many inhalers over the years.  She started smoking at a very young age (37) and quit in 2013.  She is currently maintained on an oral Ellipta which she feels gives her some relief.  She is also using her nebulizers on a daily basis.  Patient currently reports a cough that is constant and sometimes productive.  She reports a wheeze that is also constant and  feels that she is wheezing at the time of our interview.  She is very short of breath with exertion but not much while at rest.  She does wake up from sleep feeling short of breath.  She tells me that she had been on oxygen in the past but not as of recently.  She snores and thinks she might have sleep apnea.  Ancillary information including prior medications, full medical/surgical/family/social histories, and PFTs (when available) are listed below and have been reviewed.   Review of Systems  Constitutional:  Negative for chills, fever and weight loss.  Respiratory:  Positive for cough, shortness of breath and wheezing.   Cardiovascular:  Negative for chest pain.     Objective:   Vitals:   07/19/22 0835  BP: 124/72  Pulse: (!) 118  Temp: 97.7 F (36.5 C)  TempSrc: Temporal  SpO2: 98%  Weight: 177 lb 9.6 oz (80.6 kg)  Height: 4'  9" (1.448 m)   98% on RA  BMI Readings from Last 3 Encounters:  07/19/22 38.43 kg/m  07/10/22 37.87 kg/m  06/07/22 36.35 kg/m   Wt Readings from Last 3 Encounters:  07/19/22 177 lb 9.6 oz (80.6 kg)  07/10/22 175 lb (79.4 kg)  06/07/22 168 lb (76.2 kg)    Physical Exam Constitutional:      General: She is not in acute distress.    Appearance: Normal appearance. She is not ill-appearing.  Cardiovascular:     Rate and Rhythm: Normal rate and regular rhythm.     Pulses: Normal pulses.     Heart sounds: Normal heart sounds.  Pulmonary:     Effort: Pulmonary effort is normal.     Breath sounds: Wheezing present.  Abdominal:     Palpations: Abdomen is soft.  Neurological:     Mental Status: She is alert. Mental status is at baseline.       Ancillary Information    Past Medical History:  Diagnosis Date   Allergy    Anemia    Anxiety    Arthritis    Asthma    Blood transfusion without reported diagnosis    C. difficile diarrhea 04/07/2016   CAP (community acquired pneumonia) 03/21/2015   Combined forms of age-related cataract of  both eyes 05/06/2016   COPD (chronic obstructive pulmonary disease) (HCC)    CVA (cerebral vascular accident) (HCC) 06/17/2018   Diabetes mellitus without complication (HCC)    Displacement of lumbar intervertebral disc without myelopathy    Emphysema of lung (HCC)    GERD (gastroesophageal reflux disease)    Glaucoma    History of chicken pox    Hyperlipidemia    Hypertension    Pneumonia 03/29/2015   Sepsis (HCC) 03/17/2016   Shortness of breath    Small cell lung cancer (HCC)      Family History  Problem Relation Age of Onset   Heart failure Mother    Heart disease Mother    Stroke Mother    Heart disease Brother    COPD Brother    Cancer Maternal Aunt        Breast Cancer     Past Surgical History:  Procedure Laterality Date   CESAREAN SECTION     ESOPHAGOGASTRODUODENOSCOPY N/A 05/17/2022   Procedure: ESOPHAGOGASTRODUODENOSCOPY (EGD);  Surgeon: [name], MD;  Location: [facility] ENDOSCOPY;  Service: Endoscopy;  Laterality: N/A;   ESOPHAGOGASTRODUODENOSCOPY N/A 07/10/2022   Procedure: ESOPHAGOGASTRODUODENOSCOPY (EGD);  Surgeon: Toledo, Boykin Nearing, MD;  Location: [facility] ENDOSCOPY;  Service: Gastroenterology;  Laterality: N/A;   ESOPHAGOGASTRODUODENOSCOPY (EGD) WITH PROPOFOL N/A 02/08/2022   Procedure: ESOPHAGOGASTRODUODENOSCOPY (EGD) WITH PROPOFOL;  Surgeon: Wyline Mood, MD;  Location: [facility] ENDOSCOPY;  Service: Gastroenterology;  Laterality: N/A;   TUBAL LIGATION     UPPER GI ENDOSCOPY      Social History   Socioeconomic History   Marital status: Widowed    Spouse name: Not on file   Number of children: 2   Years of education: Not on file   Highest education level: 10th grade  Occupational History   Occupation: disable  Tobacco Use   Smoking status: Former    Packs/day: 0.25    Years: 45.00    Additional pack years: 0.00    Total pack years: 11.25    Types: Cigarettes    Quit date: 03/17/2016    Years since quitting: 6.3   Smokeless tobacco: Never   Tobacco comments:     pt states she  quit in 2016-2017  Vaping Use   Vaping Use: Never used  Substance and Sexual Activity   Alcohol use: No    Alcohol/week: 0.0 standard drinks of alcohol   Drug use: No   Sexual activity: Not Currently    Birth control/protection: Abstinence  Other Topics Concern   Not on file  Social History Narrative   Not on file   Social Determinants of Health   Financial Resource Strain: Low Risk  (09/05/2021)   Overall Financial Resource Strain (CARDIA)    Difficulty of Paying Living Expenses: Not hard at all  Food Insecurity: No Food Insecurity (02/08/2022)   Hunger Vital Sign    Worried About Running Out of Food in the Last Year: Never true    Ran Out of Food in the Last Year: Never true  Transportation Needs: No Transportation Needs (02/08/2022)   PRAPARE - Administrator, Civil Service (Medical): No    Lack of Transportation (Non-Medical): No  Physical Activity: Inactive (09/05/2021)   Exercise Vital Sign    Days of Exercise per Week: 0 days    Minutes of Exercise per Session: 0 min  Stress: Stress Concern Present (09/05/2021)   [employer] of Occupational Health - Occupational Stress Questionnaire    Feeling of Stress : Very much  Social Connections: Socially Isolated (12/13/2020)   Social Connection and Isolation Panel [NHANES]    Frequency of Communication with Friends and Family: More than three times a week    Frequency of Social Gatherings with Friends and Family: Three times a week    Attends Religious Services: Never    Active Member of Clubs or Organizations: No    Attends Banker Meetings: Never    Marital Status: Widowed  Intimate Partner Violence: Not At Risk (02/08/2022)   Humiliation, Afraid, Rape, and Kick questionnaire    Fear of Current or Ex-Partner: No    Emotionally Abused: No    Physically Abused: No    Sexually Abused: No     Allergies  Allergen Reactions   Other Anaphylaxis   Penicillins Anaphylaxis, Rash and  Other (See Comments)    Reaction:  Tongue swelling  Has patient had a PCN reaction causing immediate rash, facial/tongue/throat swelling, SOB or lightheadedness with hypotension:  Yes   Has patient had a PCN reaction causing severe rash involving mucus membranes or skin necrosis: No Has patient had a PCN reaction that required hospitalization No Has patient had a PCN reaction occurring within the last 10 years: No If all of the above answers are "NO", then may proceed with Cephalosporin use.   Yellow Jacket Venom [Bee Venom] Anaphylaxis   Codeine Hives   Crestor [Rosuvastatin] Nausea And Vomiting    Corrected prior adverse reaction per [facility] [employer]. On 12/02/3011  patient reported N & V when she takes Crestor   Gemfibrozil Rash, Nausea And Vomiting and Swelling   Trazodone And Nefazodone Nausea And Vomiting   Lipitor [Atorvastatin] Nausea Only    By patient report 12/02/2011. Had also been prescribed pravastatin and lovastatin by previous MD. Unclear if those caused same side effects.      CBC    Component Value Date/Time   WBC 9.0 07/10/2022 1645   RBC 4.75 07/10/2022 1645   HGB 13.0 07/10/2022 1645   HGB 12.1 05/09/2021 1342   HCT 40.9 07/10/2022 1645   HCT 35.1 05/09/2021 1342   PLT 284 07/10/2022 1645   PLT 325 05/09/2021 1342   MCV 86.1  07/10/2022 1645   MCV 106 (H) 05/09/2021 1342   MCV 96 02/02/2012 1114   MCH 27.4 07/10/2022 1645   MCHC 31.8 07/10/2022 1645   RDW 13.7 07/10/2022 1645   RDW 16.9 (H) 05/09/2021 1342   RDW 14.2 02/02/2012 1114   LYMPHSABS 1.0 07/10/2022 1645   LYMPHSABS 1.1 05/09/2021 1342   MONOABS 0.3 07/10/2022 1645   EOSABS 0.1 07/10/2022 1645   EOSABS 0.4 05/09/2021 1342   BASOSABS 0.0 07/10/2022 1645   BASOSABS 0.0 05/09/2021 1342    Pulmonary Functions Testing Results:     No data to display          Outpatient Medications Prior to Visit  Medication Sig Dispense Refill   albuterol (PROVENTIL) (2.5 MG/3ML) 0.083% nebulizer  solution Take 2.5 mg by nebulization every 6 (six) hours.     clopidogrel (PLAVIX) 75 MG tablet Take 1 tablet (75 mg total) by mouth daily. 90 tablet 4   diclofenac Sodium (VOLTAREN) 1 % GEL Apply 4 g topically 4 (four) times daily. (Patient taking differently: Apply 2 g topically every 4 (four) hours as needed (pain). (Apply to lower back/back of neck)) 350 g 3   gabapentin (NEURONTIN) 300 MG capsule Take 300 mg by mouth 2 (two) times daily.     lidocaine 4 % Place 1 patch onto the skin daily. (Apply to neck and remove after 12 hours)     magnesium oxide (MAG-OX) 400 (240 Mg) MG tablet Take 400 mg by mouth daily.     metFORMIN (GLUCOPHAGE) 500 MG tablet Take 500 mg by mouth 2 (two) times daily with a meal.     metoprolol succinate (TOPROL-XL) 25 MG 24 hr tablet Take 1 tablet (25 mg total) by mouth daily. 90 tablet 3   montelukast (SINGULAIR) 10 MG tablet Take 10 mg by mouth at bedtime.     nystatin (MYCOSTATIN/NYSTOP) powder Apply 1 Application topically every 12 (twelve) hours as needed (for fungal rash).     omeprazole (PRILOSEC) 40 MG capsule Take 1 capsule (40 mg total) by mouth 2 (two) times daily. Take in the morning and at dinnertime. (Patient taking differently: Take 40 mg by mouth in the morning and at bedtime.) 120 capsule 0   ondansetron (ZOFRAN) 4 MG tablet Take 4 mg by mouth every 6 (six) hours as needed for nausea.     oxyCODONE-acetaminophen (PERCOCET) 10-325 MG tablet TAKE ONE TABLET BY MOUTH EVERY 4 HOURS AS NEEDED (VIAL) (Patient taking differently: Take 1 tablet by mouth every 6 (six) hours as needed for pain.) 180 tablet 0   polyethylene glycol (MIRALAX / GLYCOLAX) 17 g packet Take 17 g by mouth daily as needed for mild constipation.     senna (SENOKOT) 8.6 MG TABS tablet Take 2 tablets by mouth daily.     sertraline (ZOLOFT) 50 MG tablet Take 1 tablet (50 mg total) by mouth daily. 90 tablet 4   vitamin B-12 (CYANOCOBALAMIN) 1000 MCG tablet Take 1 tablet (1,000 mcg total) by  mouth daily. 30 tablet 0   zaleplon (SONATA) 5 MG capsule Take 5 mg by mouth at bedtime.     umeclidinium-vilanterol (ANORO ELLIPTA) 62.5-25 MCG/ACT AEPB Inhale 1 puff into the lungs daily at 6 (six) AM. 3 each 4   No facility-administered medications prior to visit.

## 2022-07-19 NOTE — Telephone Encounter (Signed)
Yvette Riley has been notified.   Nothing further needed.

## 2022-07-19 NOTE — Telephone Encounter (Signed)
Pt. Was seen today please ask for B hall coordinator Davetta she needs more instruction about the HST box the pt. Got today

## 2022-07-23 DIAGNOSIS — G4733 Obstructive sleep apnea (adult) (pediatric): Secondary | ICD-10-CM | POA: Diagnosis not present

## 2022-07-24 ENCOUNTER — Other Ambulatory Visit: Payer: Self-pay | Admitting: Student in an Organized Health Care Education/Training Program

## 2022-07-24 DIAGNOSIS — G4733 Obstructive sleep apnea (adult) (pediatric): Secondary | ICD-10-CM

## 2022-07-24 NOTE — Progress Notes (Signed)
ATC patient. Unable to leave vm due to mailbox being full. Will call back.

## 2022-07-25 DIAGNOSIS — N390 Urinary tract infection, site not specified: Secondary | ICD-10-CM | POA: Diagnosis not present

## 2022-07-25 DIAGNOSIS — G4700 Insomnia, unspecified: Secondary | ICD-10-CM | POA: Diagnosis not present

## 2022-07-25 NOTE — Progress Notes (Signed)
Patient is aware of results and voiced her understanding. She agrees with treatment plan. Nothing further needed.

## 2022-07-25 NOTE — Progress Notes (Signed)
ATC patient x2- unable to leave vm due to mailbox being full. Will try once more.

## 2022-07-29 DIAGNOSIS — Z1612 Extended spectrum beta lactamase (ESBL) resistance: Secondary | ICD-10-CM | POA: Diagnosis not present

## 2022-07-29 DIAGNOSIS — R300 Dysuria: Secondary | ICD-10-CM | POA: Diagnosis not present

## 2022-07-29 DIAGNOSIS — N390 Urinary tract infection, site not specified: Secondary | ICD-10-CM | POA: Diagnosis not present

## 2022-08-03 DIAGNOSIS — N390 Urinary tract infection, site not specified: Secondary | ICD-10-CM | POA: Diagnosis not present

## 2022-08-06 DIAGNOSIS — R300 Dysuria: Secondary | ICD-10-CM | POA: Diagnosis not present

## 2022-08-06 DIAGNOSIS — R1314 Dysphagia, pharyngoesophageal phase: Secondary | ICD-10-CM | POA: Diagnosis not present

## 2022-08-06 DIAGNOSIS — N390 Urinary tract infection, site not specified: Secondary | ICD-10-CM | POA: Diagnosis not present

## 2022-08-06 DIAGNOSIS — M6281 Muscle weakness (generalized): Secondary | ICD-10-CM | POA: Diagnosis not present

## 2022-08-12 DIAGNOSIS — N390 Urinary tract infection, site not specified: Secondary | ICD-10-CM | POA: Diagnosis not present

## 2022-08-12 DIAGNOSIS — G8929 Other chronic pain: Secondary | ICD-10-CM | POA: Diagnosis not present

## 2022-08-22 DIAGNOSIS — G4700 Insomnia, unspecified: Secondary | ICD-10-CM | POA: Diagnosis not present

## 2022-08-27 DIAGNOSIS — M6281 Muscle weakness (generalized): Secondary | ICD-10-CM | POA: Diagnosis not present

## 2022-08-27 DIAGNOSIS — R1314 Dysphagia, pharyngoesophageal phase: Secondary | ICD-10-CM | POA: Diagnosis not present

## 2022-08-28 DIAGNOSIS — R1314 Dysphagia, pharyngoesophageal phase: Secondary | ICD-10-CM | POA: Diagnosis not present

## 2022-08-28 DIAGNOSIS — M6281 Muscle weakness (generalized): Secondary | ICD-10-CM | POA: Diagnosis not present

## 2022-08-29 DIAGNOSIS — M6281 Muscle weakness (generalized): Secondary | ICD-10-CM | POA: Diagnosis not present

## 2022-08-30 DIAGNOSIS — M6281 Muscle weakness (generalized): Secondary | ICD-10-CM | POA: Diagnosis not present

## 2022-09-02 DIAGNOSIS — M6281 Muscle weakness (generalized): Secondary | ICD-10-CM | POA: Diagnosis not present

## 2022-09-03 DIAGNOSIS — M6281 Muscle weakness (generalized): Secondary | ICD-10-CM | POA: Diagnosis not present

## 2022-09-04 DIAGNOSIS — M6281 Muscle weakness (generalized): Secondary | ICD-10-CM | POA: Diagnosis not present

## 2022-09-05 ENCOUNTER — Encounter: Payer: Self-pay | Admitting: Urology

## 2022-09-05 ENCOUNTER — Ambulatory Visit (INDEPENDENT_AMBULATORY_CARE_PROVIDER_SITE_OTHER): Payer: 59 | Admitting: Urology

## 2022-09-05 VITALS — BP 133/88 | HR 106 | Ht <= 58 in | Wt 178.0 lb

## 2022-09-05 DIAGNOSIS — Z09 Encounter for follow-up examination after completed treatment for conditions other than malignant neoplasm: Secondary | ICD-10-CM | POA: Diagnosis not present

## 2022-09-05 DIAGNOSIS — Z87440 Personal history of urinary (tract) infections: Secondary | ICD-10-CM | POA: Diagnosis not present

## 2022-09-05 DIAGNOSIS — N390 Urinary tract infection, site not specified: Secondary | ICD-10-CM

## 2022-09-05 LAB — MICROSCOPIC EXAMINATION

## 2022-09-05 LAB — URINALYSIS, COMPLETE
Bilirubin, UA: NEGATIVE
Glucose, UA: NEGATIVE
Ketones, UA: NEGATIVE
Nitrite, UA: NEGATIVE
Protein,UA: NEGATIVE
RBC, UA: NEGATIVE
Specific Gravity, UA: 1.015 (ref 1.005–1.030)
Urobilinogen, Ur: 0.2 mg/dL (ref 0.2–1.0)
pH, UA: 5.5 (ref 5.0–7.5)

## 2022-09-05 LAB — BLADDER SCAN AMB NON-IMAGING: Scan Result: 45

## 2022-09-05 NOTE — Progress Notes (Signed)
I,Dina M Abdulla,acting as a scribe for Riki Altes, MD.,have documented all relevant documentation on the behalf of Riki Altes, MD,as directed by  Riki Altes, MD while in the presence of Riki Altes, MD.  8/8/22024 2:59 PM   Yvette Riley 02-Nov-1955 [id number]  Referring provider: Sherol Dade, DO [address] [place],  [place] [address]  Chief Complaint  Patient presents with   Recurrent UTI    HPI: Yvette Riley is a 67 y.o. female referred for evaluation of UTI.  Currently at [facility]. She was treated for Klebsiella UTI that was diagnosed 08/03/2022. States her symptoms were frequency, urgency, and dysuria. Symptoms resolved with antibiotic. She denies recurrent infections and states this was the first infection she has had in 20 years. No complaints today.  Denies bothersome LUTS. Denies flank, abdominal, or pelvic pain. CT abdomen/pelvis with contrast performed January 2024, showed no urinary tract calculi or GU abnormalities.   PMH: Past Medical History:  Diagnosis Date   Allergy    Anemia    Anxiety    Arthritis    Asthma    Blood transfusion without reported diagnosis    C. difficile diarrhea 04/07/2016   CAP (community acquired pneumonia) 03/21/2015   Combined forms of age-related cataract of both eyes 05/06/2016   COPD (chronic obstructive pulmonary disease) (HCC)    CVA (cerebral vascular accident) (HCC) 06/17/2018   Diabetes mellitus without complication (HCC)    Displacement of lumbar intervertebral disc without myelopathy    Emphysema of lung (HCC)    GERD (gastroesophageal reflux disease)    Glaucoma    History of chicken pox    Hyperlipidemia    Hypertension    Pneumonia 03/29/2015   Sepsis (HCC) 03/17/2016   Shortness of breath    Small cell lung cancer [facility])     Surgical History: Past Surgical History:  Procedure Laterality Date   CESAREAN SECTION     ESOPHAGOGASTRODUODENOSCOPY N/A 05/17/2022   Procedure:  ESOPHAGOGASTRODUODENOSCOPY (EGD);  Surgeon: [name], MD;  Location: [facility] ENDOSCOPY;  Service: Endoscopy;  Laterality: N/A;   ESOPHAGOGASTRODUODENOSCOPY N/A 07/10/2022   Procedure: ESOPHAGOGASTRODUODENOSCOPY (EGD);  Surgeon: Toledo, Boykin Nearing, MD;  Location: [facility] ENDOSCOPY;  Service: Gastroenterology;  Laterality: N/A;   ESOPHAGOGASTRODUODENOSCOPY (EGD) WITH PROPOFOL N/A 02/08/2022   Procedure: ESOPHAGOGASTRODUODENOSCOPY (EGD) WITH PROPOFOL;  Surgeon: Wyline Mood, MD;  Location: [facility] ENDOSCOPY;  Service: Gastroenterology;  Laterality: N/A;   TUBAL LIGATION     UPPER GI ENDOSCOPY      Home Medications:  Allergies as of 09/05/2022       Reactions   Other Anaphylaxis   Penicillins Anaphylaxis, Rash, Other (See Comments)   Reaction:  Tongue swelling  Has patient had a PCN reaction causing immediate rash, facial/tongue/throat swelling, SOB or lightheadedness with hypotension:  Yes   Has patient had a PCN reaction causing severe rash involving mucus membranes or skin necrosis: No Has patient had a PCN reaction that required hospitalization No Has patient had a PCN reaction occurring within the last 10 years: No If all of the above answers are "NO", then may proceed with Cephalosporin use.   Yellow Jacket Venom [bee Venom] Anaphylaxis   Codeine Hives   Crestor [rosuvastatin] Nausea And Vomiting   Corrected prior adverse reaction per [facility] [employer]. On 12/02/3011  patient reported N & V when she takes Crestor   Gemfibrozil Rash, Nausea And Vomiting, Swelling   Trazodone And Nefazodone Nausea And Vomiting   Lipitor [atorvastatin]  Nausea Only   By patient report 12/02/2011. Had also been prescribed pravastatin and lovastatin by previous MD. Unclear if those caused same side effects.         Medication List        Accurate as of September 05, 2022  2:59 PM. If you have any questions, ask your nurse or doctor.          albuterol (2.5 MG/3ML) 0.083% nebulizer solution Commonly known as:  PROVENTIL Take 2.5 mg by nebulization every 6 (six) hours.   clopidogrel 75 MG tablet Commonly known as: PLAVIX Take 1 tablet (75 mg total) by mouth daily.   cyanocobalamin 1000 MCG tablet Commonly known as: VITAMIN B12 Take 1 tablet (1,000 mcg total) by mouth daily.   diclofenac Sodium 1 % Gel Commonly known as: VOLTAREN Apply 4 g topically 4 (four) times daily. What changed:  how much to take when to take this reasons to take this additional instructions   gabapentin 300 MG capsule Commonly known as: NEURONTIN Take 300 mg by mouth 2 (two) times daily.   lidocaine 4 % Place 1 patch onto the skin daily. (Apply to neck and remove after 12 hours)   magnesium oxide 400 (240 Mg) MG tablet Commonly known as: MAG-OX Take 400 mg by mouth daily.   metFORMIN 500 MG tablet Commonly known as: GLUCOPHAGE Take 500 mg by mouth 2 (two) times daily with a meal.   metoprolol succinate 25 MG 24 hr tablet Commonly known as: TOPROL-XL Take 1 tablet (25 mg total) by mouth daily.   montelukast 10 MG tablet Commonly known as: SINGULAIR Take 10 mg by mouth at bedtime.   nystatin powder Commonly known as: MYCOSTATIN/NYSTOP Apply 1 Application topically every 12 (twelve) hours as needed (for fungal rash).   omeprazole 40 MG capsule Commonly known as: PRILOSEC Take 1 capsule (40 mg total) by mouth 2 (two) times daily. Take in the morning and at dinnertime. What changed:  when to take this additional instructions   ondansetron 4 MG tablet Commonly known as: ZOFRAN Take 4 mg by mouth every 6 (six) hours as needed for nausea.   oxyCODONE-acetaminophen 10-325 MG tablet Commonly known as: PERCOCET TAKE ONE TABLET BY MOUTH EVERY 4 HOURS AS NEEDED (VIAL) What changed: See the new instructions.   polyethylene glycol 17 g packet Commonly known as: MIRALAX / GLYCOLAX Take 17 g by mouth daily as needed for mild constipation.   senna 8.6 MG Tabs tablet Commonly known as: SENOKOT Take 2  tablets by mouth daily.   sertraline 50 MG tablet Commonly known as: ZOLOFT Take 1 tablet (50 mg total) by mouth daily.   Trelegy Ellipta 200-62.5-25 MCG/ACT Aepb Generic drug: Fluticasone-Umeclidin-Vilant Inhale 1 puff into the lungs daily.   zaleplon 5 MG capsule Commonly known as: SONATA Take 5 mg by mouth at bedtime.        Allergies:  Allergies  Allergen Reactions   Other Anaphylaxis   Penicillins Anaphylaxis, Rash and Other (See Comments)    Reaction:  Tongue swelling  Has patient had a PCN reaction causing immediate rash, facial/tongue/throat swelling, SOB or lightheadedness with hypotension:  Yes   Has patient had a PCN reaction causing severe rash involving mucus membranes or skin necrosis: No Has patient had a PCN reaction that required hospitalization No Has patient had a PCN reaction occurring within the last 10 years: No If all of the above answers are "NO", then may proceed with Cephalosporin use.   Yellow Jacket Venom [  Bee Venom] Anaphylaxis   Codeine Hives   Crestor [Rosuvastatin] Nausea And Vomiting    Corrected prior adverse reaction per [facility] [employer]. On 12/02/3011  patient reported N & V when she takes Crestor   Gemfibrozil Rash, Nausea And Vomiting and Swelling   Trazodone And Nefazodone Nausea And Vomiting   Lipitor [Atorvastatin] Nausea Only    By patient report 12/02/2011. Had also been prescribed pravastatin and lovastatin by previous MD. Unclear if those caused same side effects.     Family History: Family History  Problem Relation Age of Onset   Heart failure Mother    Heart disease Mother    Stroke Mother    Heart disease Brother    COPD Brother    Cancer Maternal Aunt        Breast Cancer    Social History:  reports that she quit smoking about 6 years ago. Her smoking use included cigarettes. She started smoking about 51 years ago. She has a 11.3 pack-year smoking history. She has never used smokeless tobacco. She reports that she  does not drink alcohol and does not use drugs.   Physical Exam: BP 133/88   Pulse (!) 106   Ht 4\' 9"  (1.448 m)   Wt 178 lb (80.7 kg)   BMI 38.52 kg/m   Constitutional:  Alert and oriented, No acute distress. HEENT: [place] AT, moist mucus membranes.  Trachea midline, no masses. Cardiovascular: No clubbing, cyanosis, or edema. Respiratory: Normal respiratory effort, no increased work of breathing. GI: Abdomen is soft, nontender, nondistended, no abdominal masses Skin: No rashes, bruises or suspicious lesions. Neurologic: Grossly intact, no focal deficits, moving all 4 extremities. Psychiatric: Normal mood and affect.   Laboratory Data:  Urinalysis  Dipstick trace leukocytes/microscopy negative.    Assessment & Plan:    According to patient and available records for review, she had an isolated UTI, which was treated and she is asymptomatic.  Urinalysis today clear. Recent CT showed no abstract abnormalities. PVR today 45 mL. No further evaluation needed and she may follow up as needed.  I have reviewed the above documentation for accuracy and completeness, and I agree with the above.   Riki Altes, MD  [facility] [address] [place], [place] [address] [phone]

## 2022-09-06 DIAGNOSIS — M6281 Muscle weakness (generalized): Secondary | ICD-10-CM | POA: Diagnosis not present

## 2022-09-07 DIAGNOSIS — M6281 Muscle weakness (generalized): Secondary | ICD-10-CM | POA: Diagnosis not present

## 2022-09-10 DIAGNOSIS — M6281 Muscle weakness (generalized): Secondary | ICD-10-CM | POA: Diagnosis not present

## 2022-09-11 DIAGNOSIS — M6281 Muscle weakness (generalized): Secondary | ICD-10-CM | POA: Diagnosis not present

## 2022-09-12 DIAGNOSIS — J449 Chronic obstructive pulmonary disease, unspecified: Secondary | ICD-10-CM | POA: Diagnosis not present

## 2022-09-12 DIAGNOSIS — I6930 Unspecified sequelae of cerebral infarction: Secondary | ICD-10-CM | POA: Diagnosis not present

## 2022-09-12 DIAGNOSIS — I10 Essential (primary) hypertension: Secondary | ICD-10-CM | POA: Diagnosis not present

## 2022-09-12 DIAGNOSIS — L089 Local infection of the skin and subcutaneous tissue, unspecified: Secondary | ICD-10-CM | POA: Diagnosis not present

## 2022-09-12 DIAGNOSIS — M6281 Muscle weakness (generalized): Secondary | ICD-10-CM | POA: Diagnosis not present

## 2022-09-12 DIAGNOSIS — R1310 Dysphagia, unspecified: Secondary | ICD-10-CM | POA: Diagnosis not present

## 2022-09-13 DIAGNOSIS — M6281 Muscle weakness (generalized): Secondary | ICD-10-CM | POA: Diagnosis not present

## 2022-09-16 DIAGNOSIS — E559 Vitamin D deficiency, unspecified: Secondary | ICD-10-CM | POA: Diagnosis not present

## 2022-09-16 DIAGNOSIS — E119 Type 2 diabetes mellitus without complications: Secondary | ICD-10-CM | POA: Diagnosis not present

## 2022-09-16 DIAGNOSIS — Z79899 Other long term (current) drug therapy: Secondary | ICD-10-CM | POA: Diagnosis not present

## 2022-09-16 DIAGNOSIS — M6281 Muscle weakness (generalized): Secondary | ICD-10-CM | POA: Diagnosis not present

## 2022-09-17 DIAGNOSIS — B372 Candidiasis of skin and nail: Secondary | ICD-10-CM | POA: Diagnosis not present

## 2022-09-17 DIAGNOSIS — M6281 Muscle weakness (generalized): Secondary | ICD-10-CM | POA: Diagnosis not present

## 2022-09-18 DIAGNOSIS — M6281 Muscle weakness (generalized): Secondary | ICD-10-CM | POA: Diagnosis not present

## 2022-09-19 DIAGNOSIS — M6281 Muscle weakness (generalized): Secondary | ICD-10-CM | POA: Diagnosis not present

## 2022-09-20 DIAGNOSIS — M6281 Muscle weakness (generalized): Secondary | ICD-10-CM | POA: Diagnosis not present

## 2022-09-23 DIAGNOSIS — E785 Hyperlipidemia, unspecified: Secondary | ICD-10-CM | POA: Diagnosis not present

## 2022-09-23 DIAGNOSIS — E559 Vitamin D deficiency, unspecified: Secondary | ICD-10-CM | POA: Diagnosis not present

## 2022-09-23 DIAGNOSIS — E039 Hypothyroidism, unspecified: Secondary | ICD-10-CM | POA: Diagnosis not present

## 2022-09-23 DIAGNOSIS — M6281 Muscle weakness (generalized): Secondary | ICD-10-CM | POA: Diagnosis not present

## 2022-09-23 DIAGNOSIS — Z131 Encounter for screening for diabetes mellitus: Secondary | ICD-10-CM | POA: Diagnosis not present

## 2022-09-23 DIAGNOSIS — E538 Deficiency of other specified B group vitamins: Secondary | ICD-10-CM | POA: Diagnosis not present

## 2022-09-24 DIAGNOSIS — M6281 Muscle weakness (generalized): Secondary | ICD-10-CM | POA: Diagnosis not present

## 2022-09-25 DIAGNOSIS — M6281 Muscle weakness (generalized): Secondary | ICD-10-CM | POA: Diagnosis not present

## 2022-09-25 DIAGNOSIS — R21 Rash and other nonspecific skin eruption: Secondary | ICD-10-CM | POA: Diagnosis not present

## 2022-09-26 DIAGNOSIS — M6281 Muscle weakness (generalized): Secondary | ICD-10-CM | POA: Diagnosis not present

## 2022-09-27 DIAGNOSIS — M6281 Muscle weakness (generalized): Secondary | ICD-10-CM | POA: Diagnosis not present

## 2022-09-30 DIAGNOSIS — M6281 Muscle weakness (generalized): Secondary | ICD-10-CM | POA: Diagnosis not present

## 2022-10-01 DIAGNOSIS — M6281 Muscle weakness (generalized): Secondary | ICD-10-CM | POA: Diagnosis not present

## 2022-10-02 DIAGNOSIS — M6281 Muscle weakness (generalized): Secondary | ICD-10-CM | POA: Diagnosis not present

## 2022-10-03 DIAGNOSIS — M6281 Muscle weakness (generalized): Secondary | ICD-10-CM | POA: Diagnosis not present

## 2022-10-04 DIAGNOSIS — M6281 Muscle weakness (generalized): Secondary | ICD-10-CM | POA: Diagnosis not present

## 2022-10-07 DIAGNOSIS — M6281 Muscle weakness (generalized): Secondary | ICD-10-CM | POA: Diagnosis not present

## 2022-10-08 DIAGNOSIS — M6281 Muscle weakness (generalized): Secondary | ICD-10-CM | POA: Diagnosis not present

## 2022-10-09 DIAGNOSIS — M6281 Muscle weakness (generalized): Secondary | ICD-10-CM | POA: Diagnosis not present

## 2022-10-10 DIAGNOSIS — M6281 Muscle weakness (generalized): Secondary | ICD-10-CM | POA: Diagnosis not present

## 2022-10-14 DIAGNOSIS — M6281 Muscle weakness (generalized): Secondary | ICD-10-CM | POA: Diagnosis not present

## 2022-10-15 DIAGNOSIS — M6281 Muscle weakness (generalized): Secondary | ICD-10-CM | POA: Diagnosis not present

## 2022-10-17 DIAGNOSIS — G453 Amaurosis fugax: Secondary | ICD-10-CM | POA: Diagnosis not present

## 2022-10-17 DIAGNOSIS — H25813 Combined forms of age-related cataract, bilateral: Secondary | ICD-10-CM | POA: Diagnosis not present

## 2022-10-17 DIAGNOSIS — E119 Type 2 diabetes mellitus without complications: Secondary | ICD-10-CM | POA: Diagnosis not present

## 2022-10-17 DIAGNOSIS — H35012 Changes in retinal vascular appearance, left eye: Secondary | ICD-10-CM | POA: Diagnosis not present

## 2022-10-18 DIAGNOSIS — M6281 Muscle weakness (generalized): Secondary | ICD-10-CM | POA: Diagnosis not present

## 2022-10-19 DIAGNOSIS — Z79899 Other long term (current) drug therapy: Secondary | ICD-10-CM | POA: Diagnosis not present

## 2022-10-20 DIAGNOSIS — M6281 Muscle weakness (generalized): Secondary | ICD-10-CM | POA: Diagnosis not present

## 2022-10-21 DIAGNOSIS — M6281 Muscle weakness (generalized): Secondary | ICD-10-CM | POA: Diagnosis not present

## 2022-10-22 ENCOUNTER — Telehealth: Payer: Self-pay

## 2022-10-22 ENCOUNTER — Ambulatory Visit: Payer: 59 | Attending: Student in an Organized Health Care Education/Training Program

## 2022-10-22 DIAGNOSIS — Z87891 Personal history of nicotine dependence: Secondary | ICD-10-CM | POA: Diagnosis not present

## 2022-10-22 DIAGNOSIS — G4700 Insomnia, unspecified: Secondary | ICD-10-CM | POA: Diagnosis not present

## 2022-10-22 DIAGNOSIS — J4540 Moderate persistent asthma, uncomplicated: Secondary | ICD-10-CM | POA: Diagnosis not present

## 2022-10-22 DIAGNOSIS — H532 Diplopia: Secondary | ICD-10-CM | POA: Diagnosis not present

## 2022-10-22 DIAGNOSIS — J42 Unspecified chronic bronchitis: Secondary | ICD-10-CM | POA: Diagnosis not present

## 2022-10-22 DIAGNOSIS — I6523 Occlusion and stenosis of bilateral carotid arteries: Secondary | ICD-10-CM | POA: Diagnosis not present

## 2022-10-22 LAB — PULMONARY FUNCTION TEST ARMC ONLY
DL/VA % pred: 79 %
DL/VA: 3.48 ml/min/mmHg/L
DLCO unc % pred: 60 %
DLCO unc: 9.36 ml/min/mmHg
FEF 25-75 Post: 0.88 L/s
FEF 25-75 Pre: 0.96 L/s
FEF2575-%Change-Post: -7 %
FEF2575-%Pred-Post: 51 %
FEF2575-%Pred-Pre: 56 %
FEV1-%Change-Post: 2 %
FEV1-%Pred-Post: 67 %
FEV1-%Pred-Pre: 65 %
FEV1-Post: 1.18 L
FEV1-Pre: 1.16 L
FEV1FVC-%Change-Post: -2 %
FEV1FVC-%Pred-Pre: 97 %
FEV6-%Change-Post: 5 %
FEV6-%Pred-Post: 73 %
FEV6-%Pred-Pre: 69 %
FEV6-Post: 1.63 L
FEV6-Pre: 1.55 L
FEV6FVC-%Pred-Post: 104 %
FEV6FVC-%Pred-Pre: 104 %
FVC-%Change-Post: 5 %
FVC-%Pred-Post: 70 %
FVC-%Pred-Pre: 66 %
FVC-Post: 1.63 L
FVC-Pre: 1.55 L
Post FEV1/FVC ratio: 72 %
Post FEV6/FVC ratio: 100 %
Pre FEV1/FVC ratio: 75 %
Pre FEV6/FVC Ratio: 100 %
RV % pred: 104 %
RV: 1.84 L
TLC % pred: 87 %
TLC: 3.49 L

## 2022-10-22 MED ORDER — ALBUTEROL SULFATE (2.5 MG/3ML) 0.083% IN NEBU
2.5000 mg | INHALATION_SOLUTION | Freq: Once | RESPIRATORY_TRACT | Status: AC
  Administered 2022-10-22: 2.5 mg via RESPIRATORY_TRACT
  Filled 2022-10-22: qty 3

## 2022-10-22 NOTE — Telephone Encounter (Signed)
Spoke to [place] with [employer]. He stated that according to [employer]'s record, cpap order was voided per patient's brother request, as skilled nursing was going to provide patient with CPAP.   Called [facility] and was transferred to nurses station. Line rang for >70min. No option to leave vm. Will call back.

## 2022-10-23 ENCOUNTER — Encounter: Payer: Self-pay | Admitting: Student in an Organized Health Care Education/Training Program

## 2022-10-23 ENCOUNTER — Ambulatory Visit: Payer: 59 | Admitting: Student in an Organized Health Care Education/Training Program

## 2022-10-23 VITALS — BP 116/78 | HR 105 | Temp 98.1°F | Ht <= 58 in | Wt 183.0 lb

## 2022-10-23 DIAGNOSIS — J4540 Moderate persistent asthma, uncomplicated: Secondary | ICD-10-CM

## 2022-10-23 DIAGNOSIS — G4733 Obstructive sleep apnea (adult) (pediatric): Secondary | ICD-10-CM

## 2022-10-23 DIAGNOSIS — J42 Unspecified chronic bronchitis: Secondary | ICD-10-CM | POA: Diagnosis not present

## 2022-10-23 DIAGNOSIS — M6281 Muscle weakness (generalized): Secondary | ICD-10-CM | POA: Diagnosis not present

## 2022-10-23 NOTE — Progress Notes (Signed)
Assessment & Plan:   #Moderate persistent asthma without complication #Chronic bronchitis, unspecified chronic bronchitis type (HCC) #OSA (obstructive sleep apnea)   Presenting for the evaluation of cough, wheeze, and shortness of breath in the setting of small cell lung cancer treated with chemoradiation as well as history of COPD and asthma.  I suspect that the patient has COPD/asthma overlap syndrome compounded by the effects of her malignancy as well as radiation changes. With initiation of Trelegy Ellipta, she reports feeling much better with improvement in symptoms overall. Her max eosinophil count was 400 in the past and I suspect she would benefit from being continued on inhaled corticosteroids.  She is also maintained on montelukast.   PFT's show findings consistent with PRiSM (preserved ratio, impaired spirometry) with no reversibility of obstruction following albuterol.  Finally, given her concern for sleep apnea, body habitus, and snoring, sleep study was obtained and was positive. We initiated CPAP but this is yet to arrive and we will re-attempt today to get it delivered.   - Continue Trelegy Ellipta - AMB REFERRAL FOR DME  Return in about 1 year (around 10/23/2023).  I spent 30 minutes caring for this patient today, including preparing to see the patient, obtaining a medical history , reviewing a separately obtained history, performing a medically appropriate examination and/or evaluation, counseling and educating the patient/family/caregiver, ordering medications, tests, or procedures, and documenting clinical information in the electronic health record  Raechel Chute, MD [facility] 10/23/2022 9:53 AM    End of visit medications:  No orders of the defined types were placed in this encounter.    Current Outpatient Medications:    albuterol (PROVENTIL) (2.5 MG/3ML) 0.083% nebulizer solution, Take 2.5 mg by nebulization every 6 (six) hours., Disp: ,  Rfl:    busPIRone (BUSPAR) 5 MG tablet, Take 5 mg by mouth 3 (three) times daily., Disp: , Rfl:    clonazePAM (KLONOPIN) 0.5 MG tablet, TAKE ONE TABLET (0.5 mg total) BY MOUTH TWICE DAILY AS NEEDED, Disp: , Rfl:    clopidogrel (PLAVIX) 75 MG tablet, Take 1 tablet (75 mg total) by mouth daily., Disp: 90 tablet, Rfl: 4   diclofenac Sodium (VOLTAREN) 1 % GEL, Apply 4 g topically 4 (four) times daily. (Patient taking differently: Apply 2 g topically every 4 (four) hours as needed (pain). (Apply to lower back/back of neck)), Disp: 350 g, Rfl: 3   ezetimibe (ZETIA) 10 MG tablet, Take 10 mg by mouth daily., Disp: , Rfl:    Fluticasone-Umeclidin-Vilant (TRELEGY ELLIPTA) 200-62.5-25 MCG/ACT AEPB, Inhale 1 puff into the lungs daily., Disp: 30 each, Rfl: 12   gabapentin (NEURONTIN) 300 MG capsule, Take 300 mg by mouth 2 (two) times daily., Disp: , Rfl:    lidocaine 4 %, Place 1 patch onto the skin daily. (Apply to neck and remove after 12 hours), Disp: , Rfl:    magnesium oxide (MAG-OX) 400 (240 Mg) MG tablet, Take 400 mg by mouth daily., Disp: , Rfl:    metFORMIN (GLUCOPHAGE) 500 MG tablet, Take 500 mg by mouth 2 (two) times daily with a meal., Disp: , Rfl:    metoprolol succinate (TOPROL-XL) 25 MG 24 hr tablet, Take 1 tablet (25 mg total) by mouth daily., Disp: 90 tablet, Rfl: 3   montelukast (SINGULAIR) 10 MG tablet, Take 10 mg by mouth at bedtime., Disp: , Rfl:    nystatin (MYCOSTATIN/NYSTOP) powder, Apply 1 Application topically every 12 (twelve) hours as needed (for fungal rash)., Disp: , Rfl:  omeprazole (PRILOSEC) 40 MG capsule, Take 1 capsule (40 mg total) by mouth 2 (two) times daily. Take in the morning and at dinnertime. (Patient taking differently: Take 40 mg by mouth in the morning and at bedtime.), Disp: 120 capsule, Rfl: 0   ondansetron (ZOFRAN) 4 MG tablet, Take 4 mg by mouth every 6 (six) hours as needed for nausea., Disp: , Rfl:    oxyCODONE-acetaminophen (PERCOCET) 10-325 MG tablet, TAKE  ONE TABLET BY MOUTH EVERY 4 HOURS AS NEEDED (VIAL) (Patient taking differently: Take 1 tablet by mouth every 6 (six) hours as needed for pain.), Disp: 180 tablet, Rfl: 0   polyethylene glycol (MIRALAX / GLYCOLAX) 17 g packet, Take 17 g by mouth daily as needed for mild constipation., Disp: , Rfl:    senna (SENOKOT) 8.6 MG TABS tablet, Take 2 tablets by mouth daily., Disp: , Rfl:    sertraline (ZOLOFT) 50 MG tablet, Take 1 tablet (50 mg total) by mouth daily., Disp: 90 tablet, Rfl: 4   vitamin B-12 (CYANOCOBALAMIN) 1000 MCG tablet, Take 1 tablet (1,000 mcg total) by mouth daily., Disp: 30 tablet, Rfl: 0   zaleplon (SONATA) 5 MG capsule, Take 5 mg by mouth at bedtime., Disp: , Rfl:    Subjective:   PATIENT ID: Yvette Riley GENDER: female DOB: [birth year], MRN: [id number]  Chief Complaint  Patient presents with   Follow-up    DOE. Wheezing. Dry cough.     HPI  Patient is a 67 year old female presenting to clinic for follow up of cough and shortness of breath.   She has a history of small cell lung cancer previously treated with chemoradiation, now in remission, followed by Dr. Orlie Dakin from oncology.  She was referred to [place] for the evaluation of cough and shortness of breath.  She reports chronic symptoms that have been persistent, she was previously told she has asthma as well as COPD.  She tells me that she was diagnosed with asthma as a child and and had been on many inhalers over the years.  She started smoking at a very young age (66) and quit in 2013.  During our previous visit, I added ICS to her regimen by switching her to Trelegy Ellipta. She's felt better with this regimen, and reports that the Trelegy helps much. She continues to have symptoms or shortness of breath and cough as well as wheeze, thou this is better. Shortness of breath is also significantly improved compared to prior. Patient is yet to receive her CPAP machine.  Patient currently resides at a nursing facility. She  is a non-smoker. She does not have any current exposures.  Ancillary information including prior medications, full medical/surgical/family/social histories, and PFTs (when available) are listed below and have been reviewed.   Review of Systems  Constitutional:  Negative for chills, fever and weight loss.  Respiratory:  Positive for cough, shortness of breath and wheezing.   Cardiovascular:  Negative for chest pain.     Objective:   Vitals:   10/23/22 0933  BP: 116/78  Pulse: (!) 105  Temp: 98.1 F (36.7 C)  SpO2: 94%  Weight: 183 lb (83 kg)  Height: 4\' 9"  (1.448 m)   94% on RA  BMI Readings from Last 3 Encounters:  10/23/22 39.60 kg/m  09/05/22 38.52 kg/m  07/19/22 38.43 kg/m   Wt Readings from Last 3 Encounters:  10/23/22 183 lb (83 kg)  09/05/22 178 lb (80.7 kg)  07/19/22 177 lb 9.6 oz (80.6 kg)    Physical  Exam Constitutional:      General: She is not in acute distress.    Appearance: Normal appearance. She is not ill-appearing.  Cardiovascular:     Rate and Rhythm: Normal rate and regular rhythm.     Pulses: Normal pulses.     Heart sounds: Normal heart sounds.  Pulmonary:     Effort: Pulmonary effort is normal.     Breath sounds: Wheezing present.  Abdominal:     Palpations: Abdomen is soft.  Neurological:     Mental Status: She is alert. Mental status is at baseline.       Ancillary Information    Past Medical History:  Diagnosis Date   Allergy    Anemia    Anxiety    Arthritis    Asthma    Blood transfusion without reported diagnosis    C. difficile diarrhea 04/07/2016   CAP (community acquired pneumonia) 03/21/2015   Combined forms of age-related cataract of both eyes 05/06/2016   COPD (chronic obstructive pulmonary disease) (HCC)    CVA (cerebral vascular accident) (HCC) 06/17/2018   Diabetes mellitus without complication (HCC)    Displacement of lumbar intervertebral disc without myelopathy    Emphysema of lung (HCC)    GERD  (gastroesophageal reflux disease)    Glaucoma    History of chicken pox    Hyperlipidemia    Hypertension    Pneumonia 03/29/2015   Sepsis (HCC) 03/17/2016   Shortness of breath    Small cell lung cancer (HCC)      Family History  Problem Relation Age of Onset   Heart failure Mother    Heart disease Mother    Stroke Mother    Heart disease Brother    COPD Brother    Cancer Maternal Aunt        Breast Cancer     Past Surgical History:  Procedure Laterality Date   CESAREAN SECTION     ESOPHAGOGASTRODUODENOSCOPY N/A 05/17/2022   Procedure: ESOPHAGOGASTRODUODENOSCOPY (EGD);  Surgeon: [name], MD;  Location: [facility] ENDOSCOPY;  Service: Endoscopy;  Laterality: N/A;   ESOPHAGOGASTRODUODENOSCOPY N/A 07/10/2022   Procedure: ESOPHAGOGASTRODUODENOSCOPY (EGD);  Surgeon: Toledo, Boykin Nearing, MD;  Location: [facility] ENDOSCOPY;  Service: Gastroenterology;  Laterality: N/A;   ESOPHAGOGASTRODUODENOSCOPY (EGD) WITH PROPOFOL N/A 02/08/2022   Procedure: ESOPHAGOGASTRODUODENOSCOPY (EGD) WITH PROPOFOL;  Surgeon: Wyline Mood, MD;  Location: [facility] ENDOSCOPY;  Service: Gastroenterology;  Laterality: N/A;   TUBAL LIGATION     UPPER GI ENDOSCOPY      Social History   Socioeconomic History   Marital status: Widowed    Spouse name: Not on file   Number of children: 2   Years of education: Not on file   Highest education level: 10th grade  Occupational History   Occupation: disable  Tobacco Use   Smoking status: Former    Current packs/day: 0.00    Average packs/day: 0.3 packs/day for 45.0 years (11.3 ttl pk-yrs)    Types: Cigarettes    Start date: 03/18/1971    Quit date: 03/17/2016    Years since quitting: 6.6   Smokeless tobacco: Never   Tobacco comments:    pt states she quit in 2016-2017  Vaping Use   Vaping status: Never Used  Substance and Sexual Activity   Alcohol use: No    Alcohol/week: 0.0 standard drinks of alcohol   Drug use: No   Sexual activity: Not Currently    Birth  control/protection: Abstinence  Other Topics Concern   Not on file  Social History Narrative   Not on file   Social Determinants of Health   Financial Resource Strain: Low Risk  (09/05/2021)   Overall Financial Resource Strain (CARDIA)    Difficulty of Paying Living Expenses: Not hard at all  Food Insecurity: No Food Insecurity (02/08/2022)   Hunger Vital Sign    Worried About Running Out of Food in the Last Year: Never true    Ran Out of Food in the Last Year: Never true  Transportation Needs: No Transportation Needs (02/08/2022)   PRAPARE - Administrator, Civil Service (Medical): No    Lack of Transportation (Non-Medical): No  Physical Activity: Inactive (09/05/2021)   Exercise Vital Sign    Days of Exercise per Week: 0 days    Minutes of Exercise per Session: 0 min  Stress: Stress Concern Present (09/05/2021)   [employer] of Occupational Health - Occupational Stress Questionnaire    Feeling of Stress : Very much  Social Connections: Unknown (06/01/2021)   Received from [facility]   Social Network    Social Network: Not on file  Intimate Partner Violence: Not At Risk (02/08/2022)   Humiliation, Afraid, Rape, and Kick questionnaire    Fear of Current or Ex-Partner: No    Emotionally Abused: No    Physically Abused: No    Sexually Abused: No     Allergies  Allergen Reactions   Other Anaphylaxis   Penicillins Anaphylaxis, Rash and Other (See Comments)    Reaction:  Tongue swelling  Has patient had a PCN reaction causing immediate rash, facial/tongue/throat swelling, SOB or lightheadedness with hypotension:  Yes   Has patient had a PCN reaction causing severe rash involving mucus membranes or skin necrosis: No Has patient had a PCN reaction that required hospitalization No Has patient had a PCN reaction occurring within the last 10 years: No If all of the above answers are "NO", then may proceed with Cephalosporin use.   Yellow Jacket Venom [Bee  Venom] Anaphylaxis   Codeine Hives   Crestor [Rosuvastatin] Nausea And Vomiting    Corrected prior adverse reaction per [facility] [employer]. On 12/02/3011  patient reported N & V when she takes Crestor   Gemfibrozil Rash, Nausea And Vomiting and Swelling   Trazodone And Nefazodone Nausea And Vomiting   Lipitor [Atorvastatin] Nausea Only    By patient report 12/02/2011. Had also been prescribed pravastatin and lovastatin by previous MD. Unclear if those caused same side effects.      CBC    Component Value Date/Time   WBC 9.0 07/10/2022 1645   RBC 4.75 07/10/2022 1645   HGB 13.0 07/10/2022 1645   HGB 12.1 05/09/2021 1342   HCT 40.9 07/10/2022 1645   HCT 35.1 05/09/2021 1342   PLT 284 07/10/2022 1645   PLT 325 05/09/2021 1342   MCV 86.1 07/10/2022 1645   MCV 106 (H) 05/09/2021 1342   MCV 96 02/02/2012 1114   MCH 27.4 07/10/2022 1645   MCHC 31.8 07/10/2022 1645   RDW 13.7 07/10/2022 1645   RDW 16.9 (H) 05/09/2021 1342   RDW 14.2 02/02/2012 1114   LYMPHSABS 1.0 07/10/2022 1645   LYMPHSABS 1.1 05/09/2021 1342   MONOABS 0.3 07/10/2022 1645   EOSABS 0.1 07/10/2022 1645   EOSABS 0.4 05/09/2021 1342   BASOSABS 0.0 07/10/2022 1645   BASOSABS 0.0 05/09/2021 1342    Pulmonary Functions Testing Results:     No data to display  Outpatient Medications Prior to Visit  Medication Sig Dispense Refill   albuterol (PROVENTIL) (2.5 MG/3ML) 0.083% nebulizer solution Take 2.5 mg by nebulization every 6 (six) hours.     busPIRone (BUSPAR) 5 MG tablet Take 5 mg by mouth 3 (three) times daily.     clonazePAM (KLONOPIN) 0.5 MG tablet TAKE ONE TABLET (0.5 mg total) BY MOUTH TWICE DAILY AS NEEDED     clopidogrel (PLAVIX) 75 MG tablet Take 1 tablet (75 mg total) by mouth daily. 90 tablet 4   diclofenac Sodium (VOLTAREN) 1 % GEL Apply 4 g topically 4 (four) times daily. (Patient taking differently: Apply 2 g topically every 4 (four) hours as needed (pain). (Apply to lower back/back of  neck)) 350 g 3   ezetimibe (ZETIA) 10 MG tablet Take 10 mg by mouth daily.     Fluticasone-Umeclidin-Vilant (TRELEGY ELLIPTA) 200-62.5-25 MCG/ACT AEPB Inhale 1 puff into the lungs daily. 30 each 12   gabapentin (NEURONTIN) 300 MG capsule Take 300 mg by mouth 2 (two) times daily.     lidocaine 4 % Place 1 patch onto the skin daily. (Apply to neck and remove after 12 hours)     magnesium oxide (MAG-OX) 400 (240 Mg) MG tablet Take 400 mg by mouth daily.     metFORMIN (GLUCOPHAGE) 500 MG tablet Take 500 mg by mouth 2 (two) times daily with a meal.     metoprolol succinate (TOPROL-XL) 25 MG 24 hr tablet Take 1 tablet (25 mg total) by mouth daily. 90 tablet 3   montelukast (SINGULAIR) 10 MG tablet Take 10 mg by mouth at bedtime.     nystatin (MYCOSTATIN/NYSTOP) powder Apply 1 Application topically every 12 (twelve) hours as needed (for fungal rash).     omeprazole (PRILOSEC) 40 MG capsule Take 1 capsule (40 mg total) by mouth 2 (two) times daily. Take in the morning and at dinnertime. (Patient taking differently: Take 40 mg by mouth in the morning and at bedtime.) 120 capsule 0   ondansetron (ZOFRAN) 4 MG tablet Take 4 mg by mouth every 6 (six) hours as needed for nausea.     oxyCODONE-acetaminophen (PERCOCET) 10-325 MG tablet TAKE ONE TABLET BY MOUTH EVERY 4 HOURS AS NEEDED (VIAL) (Patient taking differently: Take 1 tablet by mouth every 6 (six) hours as needed for pain.) 180 tablet 0   polyethylene glycol (MIRALAX / GLYCOLAX) 17 g packet Take 17 g by mouth daily as needed for mild constipation.     senna (SENOKOT) 8.6 MG TABS tablet Take 2 tablets by mouth daily.     sertraline (ZOLOFT) 50 MG tablet Take 1 tablet (50 mg total) by mouth daily. 90 tablet 4   vitamin B-12 (CYANOCOBALAMIN) 1000 MCG tablet Take 1 tablet (1,000 mcg total) by mouth daily. 30 tablet 0   zaleplon (SONATA) 5 MG capsule Take 5 mg by mouth at bedtime.     No facility-administered medications prior to visit.

## 2022-10-23 NOTE — Telephone Encounter (Signed)
Patient currently in office. This will be addressed during visit.

## 2022-10-24 DIAGNOSIS — L281 Prurigo nodularis: Secondary | ICD-10-CM | POA: Diagnosis not present

## 2022-10-24 DIAGNOSIS — E039 Hypothyroidism, unspecified: Secondary | ICD-10-CM | POA: Diagnosis not present

## 2022-10-24 DIAGNOSIS — L299 Pruritus, unspecified: Secondary | ICD-10-CM | POA: Diagnosis not present

## 2022-10-24 DIAGNOSIS — G8929 Other chronic pain: Secondary | ICD-10-CM | POA: Diagnosis not present

## 2022-10-24 DIAGNOSIS — M6281 Muscle weakness (generalized): Secondary | ICD-10-CM | POA: Diagnosis not present

## 2022-10-24 DIAGNOSIS — L304 Erythema intertrigo: Secondary | ICD-10-CM | POA: Diagnosis not present

## 2022-10-27 DIAGNOSIS — M6281 Muscle weakness (generalized): Secondary | ICD-10-CM | POA: Diagnosis not present

## 2022-10-30 DIAGNOSIS — I10 Essential (primary) hypertension: Secondary | ICD-10-CM | POA: Diagnosis not present

## 2022-10-30 DIAGNOSIS — R000 Tachycardia, unspecified: Secondary | ICD-10-CM | POA: Diagnosis not present

## 2022-10-31 DIAGNOSIS — G4733 Obstructive sleep apnea (adult) (pediatric): Secondary | ICD-10-CM | POA: Diagnosis not present

## 2022-10-31 DIAGNOSIS — R1310 Dysphagia, unspecified: Secondary | ICD-10-CM | POA: Diagnosis not present

## 2022-11-01 ENCOUNTER — Telehealth: Payer: Self-pay | Admitting: Student in an Organized Health Care Education/Training Program

## 2022-11-01 NOTE — Telephone Encounter (Signed)
Pt's nursing home is asking for a copy of the order with the settings for their records. Please fax to [phone]

## 2022-11-01 NOTE — Telephone Encounter (Signed)
Cpap order and settings have been [facility]. [facility] is aware. Nothing further needed.

## 2022-11-04 DIAGNOSIS — M6281 Muscle weakness (generalized): Secondary | ICD-10-CM | POA: Diagnosis not present

## 2022-11-06 DIAGNOSIS — M6281 Muscle weakness (generalized): Secondary | ICD-10-CM | POA: Diagnosis not present

## 2022-11-07 DIAGNOSIS — G4700 Insomnia, unspecified: Secondary | ICD-10-CM | POA: Diagnosis not present

## 2022-11-07 DIAGNOSIS — Z23 Encounter for immunization: Secondary | ICD-10-CM | POA: Diagnosis not present

## 2022-11-08 DIAGNOSIS — Z79899 Other long term (current) drug therapy: Secondary | ICD-10-CM | POA: Diagnosis not present

## 2022-11-11 ENCOUNTER — Encounter: Payer: Self-pay | Admitting: Cardiovascular Disease

## 2022-11-11 ENCOUNTER — Ambulatory Visit (INDEPENDENT_AMBULATORY_CARE_PROVIDER_SITE_OTHER): Payer: 59 | Admitting: Cardiovascular Disease

## 2022-11-11 VITALS — BP 130/90 | HR 100 | Ht <= 58 in | Wt 183.0 lb

## 2022-11-11 DIAGNOSIS — J449 Chronic obstructive pulmonary disease, unspecified: Secondary | ICD-10-CM

## 2022-11-11 DIAGNOSIS — I663 Occlusion and stenosis of cerebellar arteries: Secondary | ICD-10-CM | POA: Diagnosis not present

## 2022-11-11 DIAGNOSIS — I2510 Atherosclerotic heart disease of native coronary artery without angina pectoris: Secondary | ICD-10-CM | POA: Diagnosis not present

## 2022-11-11 DIAGNOSIS — R0789 Other chest pain: Secondary | ICD-10-CM | POA: Diagnosis not present

## 2022-11-11 DIAGNOSIS — I4711 Inappropriate sinus tachycardia, so stated: Secondary | ICD-10-CM

## 2022-11-11 DIAGNOSIS — M6281 Muscle weakness (generalized): Secondary | ICD-10-CM | POA: Diagnosis not present

## 2022-11-11 DIAGNOSIS — G4733 Obstructive sleep apnea (adult) (pediatric): Secondary | ICD-10-CM

## 2022-11-11 DIAGNOSIS — C3491 Malignant neoplasm of unspecified part of right bronchus or lung: Secondary | ICD-10-CM

## 2022-11-11 DIAGNOSIS — I6501 Occlusion and stenosis of right vertebral artery: Secondary | ICD-10-CM

## 2022-11-11 DIAGNOSIS — I10 Essential (primary) hypertension: Secondary | ICD-10-CM

## 2022-11-11 MED ORDER — METOPROLOL SUCCINATE ER 50 MG PO TB24
50.0000 mg | ORAL_TABLET | Freq: Every day | ORAL | 11 refills | Status: DC
Start: 2022-11-11 — End: 2022-12-20

## 2022-11-11 NOTE — Progress Notes (Signed)
Cardiology Office Note   Date:  11/11/2022   ID:  Yvette Riley, DOB 11/19/1955, MRN [id number]  PCP:  Sherol Dade, DO  Cardiologist:  Adrian Blackwater, MD      History of Present Illness: Yvette Riley is a 67 y.o. female who presents for  Chief Complaint  Patient presents with   Establish Care    Consult    At [facility], had multiple strokes on wheel chair and has chest pain  Chest Pain  This is a recurrent problem. The current episode started more than 1 month ago. The problem has been waxing and waning. The pain is at a severity of 5/10. The pain is moderate. The quality of the pain is described as heavy. Associated symptoms include a cough, PND and shortness of breath. Pertinent negatives include no lower extremity edema.      Past Medical History:  Diagnosis Date   Allergy    Anemia    Anxiety    Arthritis    Asthma    Blood transfusion without reported diagnosis    C. difficile diarrhea 04/07/2016   CAP (community acquired pneumonia) 03/21/2015   Combined forms of age-related cataract of both eyes 05/06/2016   COPD (chronic obstructive pulmonary disease) (HCC)    CVA (cerebral vascular accident) (HCC) 06/17/2018   Diabetes mellitus without complication (HCC)    Displacement of lumbar intervertebral disc without myelopathy    Emphysema of lung (HCC)    GERD (gastroesophageal reflux disease)    Glaucoma    History of chicken pox    Hyperlipidemia    Hypertension    Pneumonia 03/29/2015   Sepsis (HCC) 03/17/2016   Shortness of breath    Small cell lung cancer [facility])      Past Surgical History:  Procedure Laterality Date   CESAREAN SECTION     ESOPHAGOGASTRODUODENOSCOPY N/A 05/17/2022   Procedure: ESOPHAGOGASTRODUODENOSCOPY (EGD);  Surgeon: [name], MD;  Location: [facility] ENDOSCOPY;  Service: Endoscopy;  Laterality: N/A;   ESOPHAGOGASTRODUODENOSCOPY N/A 07/10/2022   Procedure: ESOPHAGOGASTRODUODENOSCOPY (EGD);  Surgeon: Toledo, Boykin Nearing,  MD;  Location: [facility] ENDOSCOPY;  Service: Gastroenterology;  Laterality: N/A;   ESOPHAGOGASTRODUODENOSCOPY (EGD) WITH PROPOFOL N/A 02/08/2022   Procedure: ESOPHAGOGASTRODUODENOSCOPY (EGD) WITH PROPOFOL;  Surgeon: Wyline Mood, MD;  Location: [facility] ENDOSCOPY;  Service: Gastroenterology;  Laterality: N/A;   TUBAL LIGATION     UPPER GI ENDOSCOPY       Current Outpatient Medications  Medication Sig Dispense Refill   albuterol (PROVENTIL) (2.5 MG/3ML) 0.083% nebulizer solution Take 2.5 mg by nebulization every 6 (six) hours.     busPIRone (BUSPAR) 5 MG tablet Take 5 mg by mouth 3 (three) times daily.     clopidogrel (PLAVIX) 75 MG tablet Take 1 tablet (75 mg total) by mouth daily. 90 tablet 4   diclofenac Sodium (VOLTAREN) 1 % GEL Apply 4 g topically 4 (four) times daily. (Patient taking differently: Apply 2 g topically every 4 (four) hours as needed (pain). (Apply to lower back/back of neck)) 350 g 3   ezetimibe (ZETIA) 10 MG tablet Take 10 mg by mouth daily.     Fluticasone-Umeclidin-Vilant (TRELEGY ELLIPTA) 200-62.5-25 MCG/ACT AEPB Inhale 1 puff into the lungs daily. 30 each 12   gabapentin (NEURONTIN) 300 MG capsule Take 300 mg by mouth 2 (two) times daily.     levothyroxine (SYNTHROID) 50 MCG tablet Take 50 mcg by mouth daily before breakfast.     lidocaine 4 % Place 1 patch onto the  skin daily. (Apply to neck and remove after 12 hours)     magnesium oxide (MAG-OX) 400 (240 Mg) MG tablet Take 400 mg by mouth daily.     metFORMIN (GLUCOPHAGE) 500 MG tablet Take 500 mg by mouth 2 (two) times daily with a meal.     metoprolol succinate (TOPROL XL) 50 MG 24 hr tablet Take 1 tablet (50 mg total) by mouth daily. Take with or immediately following a meal. 30 tablet 11   omeprazole (PRILOSEC) 40 MG capsule Take 1 capsule (40 mg total) by mouth 2 (two) times daily. Take in the morning and at dinnertime. (Patient taking differently: Take 40 mg by mouth in the morning and at bedtime.) 120 capsule 0    ondansetron (ZOFRAN) 4 MG tablet Take 4 mg by mouth every 6 (six) hours as needed for nausea.     oxyCODONE-acetaminophen (PERCOCET) 10-325 MG tablet TAKE ONE TABLET BY MOUTH EVERY 4 HOURS AS NEEDED (VIAL) (Patient taking differently: Take 1 tablet by mouth every 6 (six) hours as needed for pain.) 180 tablet 0   polyethylene glycol (MIRALAX / GLYCOLAX) 17 g packet Take 17 g by mouth daily as needed for mild constipation.     senna (SENOKOT) 8.6 MG TABS tablet Take 2 tablets by mouth daily.     sertraline (ZOLOFT) 25 MG tablet Take 25 mg by mouth daily.     sertraline (ZOLOFT) 50 MG tablet Take 1 tablet (50 mg total) by mouth daily. 90 tablet 4   vitamin B-12 (CYANOCOBALAMIN) 1000 MCG tablet Take 1 tablet (1,000 mcg total) by mouth daily. 30 tablet 0   zaleplon (SONATA) 5 MG capsule Take 5 mg by mouth at bedtime.     clonazePAM (KLONOPIN) 0.5 MG tablet TAKE ONE TABLET (0.5 mg total) BY MOUTH TWICE DAILY AS NEEDED (Patient not taking: Reported on 11/11/2022)     montelukast (SINGULAIR) 10 MG tablet Take 10 mg by mouth at bedtime.     nystatin (MYCOSTATIN/NYSTOP) powder Apply 1 Application topically every 12 (twelve) hours as needed (for fungal rash).     No current facility-administered medications for this visit.    Allergies:   Other, Penicillins, Yellow jacket venom [bee venom], Codeine, Crestor [rosuvastatin], Gemfibrozil, Trazodone and nefazodone, and Lipitor [atorvastatin]    Social History:   reports that she quit smoking about 6 years ago. Her smoking use included cigarettes. She started smoking about 51 years ago. She has a 11.3 pack-year smoking history. She has never used smokeless tobacco. She reports that she does not drink alcohol and does not use drugs.   Family History:  family history includes COPD in her brother; Cancer in her maternal aunt; Heart disease in her brother and mother; Heart failure in her mother; Stroke in her mother.    ROS:     Review of Systems   Constitutional: Negative.   HENT: Negative.    Eyes: Negative.   Respiratory:  Positive for cough and shortness of breath.   Cardiovascular:  Positive for chest pain and PND.  Gastrointestinal: Negative.   Genitourinary: Negative.   Musculoskeletal: Negative.   Skin: Negative.   Neurological: Negative.   Endo/Heme/Allergies: Negative.   Psychiatric/Behavioral: Negative.    All other systems reviewed and are negative.     All other systems are reviewed and negative.    PHYSICAL EXAM: VS:  BP (!) 130/90   Pulse 100   Ht 4\' 9"  (1.448 m)   Wt 183 lb (83 kg)   SpO2 97%  BMI 39.60 kg/m  , BMI Body mass index is 39.6 kg/m. Last weight:  Wt Readings from Last 3 Encounters:  11/11/22 183 lb (83 kg)  10/23/22 183 lb (83 kg)  09/05/22 178 lb (80.7 kg)     Physical Exam Constitutional:      Appearance: Normal appearance.  Cardiovascular:     Rate and Rhythm: Normal rate and regular rhythm.     Heart sounds: Normal heart sounds.  Pulmonary:     Effort: Pulmonary effort is normal.     Breath sounds: Normal breath sounds.  Musculoskeletal:     Right lower leg: No edema.     Left lower leg: No edema.  Neurological:     Mental Status: She is alert.       EKG: SINUS TACHYCARDIA RBBB 101/MIN  Recent Labs: 07/10/2022: ALT 14; BUN 15; Creatinine, Ser 0.76; Hemoglobin 13.0; Platelets 284; Potassium 4.4; Sodium 137    Lipid Panel    Component Value Date/Time   CHOL 228 (H) 06/19/2020 1405   TRIG 689 (HH) 06/19/2020 1405   HDL 35 (L) 06/19/2020 1405   CHOLHDL 6.5 (H) 06/19/2020 1405   CHOLHDL 4.2 06/17/2018 0514   VLDL 66 (H) 06/17/2018 0514   LDLCALC 82 06/19/2020 1405   LDLDIRECT 110 (H) 06/02/2018 1155      Other studies Reviewed: Additional studies/ records that were reviewed today include:  Review of the above records demonstrates:       No data to display            ASSESSMENT AND PLAN:    ICD-10-CM   1. Other chest pain  R07.89 MYOCARDIAL  PERFUSION IMAGING    PCV ECHOCARDIOGRAM COMPLETE    metoprolol succinate (TOPROL XL) 50 MG 24 hr tablet   DO ECHO, STRESS TEST    2. Atherosclerosis of coronary artery of native heart without angina pectoris, unspecified vessel or lesion type  I25.10 MYOCARDIAL PERFUSION IMAGING    PCV ECHOCARDIOGRAM COMPLETE    metoprolol succinate (TOPROL XL) 50 MG 24 hr tablet    3. Small cell lung cancer, right (HCC)  C34.91 MYOCARDIAL PERFUSION IMAGING    PCV ECHOCARDIOGRAM COMPLETE    metoprolol succinate (TOPROL XL) 50 MG 24 hr tablet    4. Chronic obstructive pulmonary disease, unspecified COPD type (HCC)  J44.9 MYOCARDIAL PERFUSION IMAGING    PCV ECHOCARDIOGRAM COMPLETE    metoprolol succinate (TOPROL XL) 50 MG 24 hr tablet    5. Vertebral artery stenosis, right  I65.01 MYOCARDIAL PERFUSION IMAGING    PCV ECHOCARDIOGRAM COMPLETE    metoprolol succinate (TOPROL XL) 50 MG 24 hr tablet    6. Embolism of superior cerebellar artery  I66.3 MYOCARDIAL PERFUSION IMAGING    PCV ECHOCARDIOGRAM COMPLETE    metoprolol succinate (TOPROL XL) 50 MG 24 hr tablet    7. Obstructive sleep apnea syndrome  G47.33 MYOCARDIAL PERFUSION IMAGING    PCV ECHOCARDIOGRAM COMPLETE    metoprolol succinate (TOPROL XL) 50 MG 24 hr tablet   ON CPAP, NEEDS O2    8. Inappropriate sinus tachycardia (HCC)  I47.11 metoprolol succinate (TOPROL XL) 50 MG 24 hr tablet   change  metoprololxl 50       Problem List Items Addressed This Visit       Cardiovascular and Mediastinum   Coronary atherosclerosis   Relevant Medications   metoprolol succinate (TOPROL XL) 50 MG 24 hr tablet   Other Relevant Orders   MYOCARDIAL PERFUSION IMAGING   PCV ECHOCARDIOGRAM COMPLETE  Embolism of superior cerebellar artery   Relevant Medications   metoprolol succinate (TOPROL XL) 50 MG 24 hr tablet   Other Relevant Orders   MYOCARDIAL PERFUSION IMAGING   PCV ECHOCARDIOGRAM COMPLETE   Vertebral artery stenosis, right   Relevant  Medications   metoprolol succinate (TOPROL XL) 50 MG 24 hr tablet   Other Relevant Orders   MYOCARDIAL PERFUSION IMAGING   PCV ECHOCARDIOGRAM COMPLETE     Respiratory   COPD (chronic obstructive pulmonary disease) (HCC) (Chronic)   Relevant Medications   metoprolol succinate (TOPROL XL) 50 MG 24 hr tablet   Other Relevant Orders   MYOCARDIAL PERFUSION IMAGING   PCV ECHOCARDIOGRAM COMPLETE   Small cell lung cancer, right (HCC)   Relevant Medications   metoprolol succinate (TOPROL XL) 50 MG 24 hr tablet   Other Relevant Orders   MYOCARDIAL PERFUSION IMAGING   PCV ECHOCARDIOGRAM COMPLETE   Other Visit Diagnoses     Other chest pain    -  Primary   DO ECHO, STRESS TEST   Relevant Medications   metoprolol succinate (TOPROL XL) 50 MG 24 hr tablet   Other Relevant Orders   MYOCARDIAL PERFUSION IMAGING   PCV ECHOCARDIOGRAM COMPLETE   Obstructive sleep apnea syndrome       ON CPAP, NEEDS O2   Relevant Medications   metoprolol succinate (TOPROL XL) 50 MG 24 hr tablet   Other Relevant Orders   MYOCARDIAL PERFUSION IMAGING   PCV ECHOCARDIOGRAM COMPLETE   Inappropriate sinus tachycardia (HCC)       change  metoprololxl 50   Relevant Medications   metoprolol succinate (TOPROL XL) 50 MG 24 hr tablet          Disposition:   Return in about 4 weeks (around 12/09/2022) for ECHO, STRESS TEST AND F/U.    Total time spent: 50 minutes  Signed,  Adrian Blackwater, MD  11/11/2022 11:34 AM    [facility]

## 2022-11-12 DIAGNOSIS — M6281 Muscle weakness (generalized): Secondary | ICD-10-CM | POA: Diagnosis not present

## 2022-11-13 DIAGNOSIS — J449 Chronic obstructive pulmonary disease, unspecified: Secondary | ICD-10-CM | POA: Diagnosis not present

## 2022-11-13 DIAGNOSIS — E538 Deficiency of other specified B group vitamins: Secondary | ICD-10-CM | POA: Diagnosis not present

## 2022-11-13 DIAGNOSIS — K219 Gastro-esophageal reflux disease without esophagitis: Secondary | ICD-10-CM | POA: Diagnosis not present

## 2022-11-13 DIAGNOSIS — G4700 Insomnia, unspecified: Secondary | ICD-10-CM | POA: Diagnosis not present

## 2022-11-13 DIAGNOSIS — G8921 Chronic pain due to trauma: Secondary | ICD-10-CM | POA: Diagnosis not present

## 2022-11-13 DIAGNOSIS — E559 Vitamin D deficiency, unspecified: Secondary | ICD-10-CM | POA: Diagnosis not present

## 2022-11-13 DIAGNOSIS — I6930 Unspecified sequelae of cerebral infarction: Secondary | ICD-10-CM | POA: Diagnosis not present

## 2022-11-14 DIAGNOSIS — G4700 Insomnia, unspecified: Secondary | ICD-10-CM | POA: Diagnosis not present

## 2022-11-15 ENCOUNTER — Ambulatory Visit (INDEPENDENT_AMBULATORY_CARE_PROVIDER_SITE_OTHER): Payer: 59 | Admitting: Nurse Practitioner

## 2022-11-15 ENCOUNTER — Encounter (INDEPENDENT_AMBULATORY_CARE_PROVIDER_SITE_OTHER): Payer: Self-pay | Admitting: Vascular Surgery

## 2022-11-15 VITALS — BP 118/73 | HR 102 | Resp 18 | Ht <= 58 in | Wt 183.0 lb

## 2022-11-15 DIAGNOSIS — E1129 Type 2 diabetes mellitus with other diabetic kidney complication: Secondary | ICD-10-CM

## 2022-11-15 DIAGNOSIS — M6281 Muscle weakness (generalized): Secondary | ICD-10-CM | POA: Diagnosis not present

## 2022-11-15 DIAGNOSIS — I6523 Occlusion and stenosis of bilateral carotid arteries: Secondary | ICD-10-CM

## 2022-11-15 DIAGNOSIS — R809 Proteinuria, unspecified: Secondary | ICD-10-CM

## 2022-11-15 DIAGNOSIS — I6501 Occlusion and stenosis of right vertebral artery: Secondary | ICD-10-CM | POA: Diagnosis not present

## 2022-11-16 NOTE — Progress Notes (Signed)
Subjective:    Patient ID: Yvette Riley, female    DOB: 07/05/1955, 67 y.o.   MRN: [id number] Chief Complaint  Patient presents with   New Patient (Initial Visit)    NP. consult. 0-59% stenosis. report in referral packet. [id number].    Yvette Riley is a 67 year old female who is referred today due to concern for possible carotid stenosis.  The patient has had multiple strokes previously had some underlying weakness.  At an outside facility she underwent a carotid artery duplex which shows a 0 to 49% stenosis in the bilateral internal carotid arteries.  At that time the right vertebral artery was not seen in the left vertebral artery had antegrade flow.  In reviewing the noninvasive studies the velocities are very low and because no plaque is identified is suggestive that stenosis is likely at about 0%.  When utilizing criteria here and [place] [facility] she would be considered a 1 to 39% stenosis which is very minimal.  She has not had any recent CVA-like symptoms.    Review of Systems  Neurological:  Positive for weakness.  All other systems reviewed and are negative.      Objective:   Physical Exam Vitals reviewed.  HENT:     Head: Normocephalic.  Neck:     Vascular: No carotid bruit.  Cardiovascular:     Rate and Rhythm: Normal rate and regular rhythm.     Pulses: Normal pulses.     Heart sounds: Normal heart sounds.  Pulmonary:     Effort: Pulmonary effort is normal.  Skin:    General: Skin is warm and dry.  Neurological:     Mental Status: She is alert and oriented to person, place, and time.     Motor: Weakness present.     Gait: Gait abnormal.  Psychiatric:        Mood and Affect: Mood normal.        Behavior: Behavior normal.        Thought Content: Thought content normal.        Judgment: Judgment normal.     BP 118/73 (BP Location: Right Arm)   Pulse (!) 102   Resp 18   Ht 4\' 9"  (1.448 m)   Wt 183 lb (83 kg)   BMI 39.60 kg/m    Past Medical History:  Diagnosis Date   Allergy    Anemia    Anxiety    Arthritis    Asthma    Blood transfusion without reported diagnosis    C. difficile diarrhea 04/07/2016   CAP (community acquired pneumonia) 03/21/2015   Combined forms of age-related cataract of both eyes 05/06/2016   COPD (chronic obstructive pulmonary disease) (HCC)    CVA (cerebral vascular accident) (HCC) 06/17/2018   Diabetes mellitus without complication (HCC)    Displacement of lumbar intervertebral disc without myelopathy    Emphysema of lung (HCC)    GERD (gastroesophageal reflux disease)    Glaucoma    History of chicken pox    Hyperlipidemia    Hypertension    Pneumonia 03/29/2015   Sepsis (HCC) 03/17/2016   Shortness of breath    Small cell lung cancer (HCC)     Social History   Socioeconomic History   Marital status: Widowed    Spouse name: Not on file   Number of children: 2   Years of education: Not on file   Highest education level: 10th grade  Occupational History   Occupation: disable  Tobacco Use   Smoking status: Former    Current packs/day: 0.00    Average packs/day: 0.3 packs/day for 45.0 years (11.3 ttl pk-yrs)    Types: Cigarettes    Start date: 03/18/1971    Quit date: 03/17/2016    Years since quitting: 6.6   Smokeless tobacco: Never   Tobacco comments:    pt states she quit in 2016-2017  Vaping Use   Vaping status: Never Used  Substance and Sexual Activity   Alcohol use: No    Alcohol/week: 0.0 standard drinks of alcohol   Drug use: No   Sexual activity: Not Currently    Birth control/protection: Abstinence  Other Topics Concern   Not on file  Social History Narrative   Not on file   Social Determinants of Health   Financial Resource Strain: Low Risk  (09/05/2021)   Overall Financial Resource Strain (CARDIA)    Difficulty of Paying Living Expenses: Not hard at all  Food Insecurity: No Food Insecurity (02/08/2022)   Hunger Vital Sign    Worried About Running  Out of Food in the Last Year: Never true    Ran Out of Food in the Last Year: Never true  Transportation Needs: No Transportation Needs (02/08/2022)   PRAPARE - Administrator, Civil Service (Medical): No    Lack of Transportation (Non-Medical): No  Physical Activity: Inactive (09/05/2021)   Exercise Vital Sign    Days of Exercise per Week: 0 days    Minutes of Exercise per Session: 0 min  Stress: Stress Concern Present (09/05/2021)   [employer] of Occupational Health - Occupational Stress Questionnaire    Feeling of Stress : Very much  Social Connections: Unknown (06/01/2021)   [facility] from [facility]   Social Network    Social Network: Not on file  Intimate Partner Violence: Not At Risk (02/08/2022)   Humiliation, Afraid, Rape, and Kick questionnaire    Fear of Current or Ex-Partner: No    Emotionally Abused: No    Physically Abused: No    Sexually Abused: No    Past Surgical History:  Procedure Laterality Date   CESAREAN SECTION     ESOPHAGOGASTRODUODENOSCOPY N/A 05/17/2022   Procedure: ESOPHAGOGASTRODUODENOSCOPY (EGD);  Surgeon: [name], MD;  Location: [facility] ENDOSCOPY;  Service: Endoscopy;  Laterality: N/A;   ESOPHAGOGASTRODUODENOSCOPY N/A 07/10/2022   Procedure: ESOPHAGOGASTRODUODENOSCOPY (EGD);  Surgeon: Toledo, Boykin Nearing, MD;  Location: [facility] ENDOSCOPY;  Service: Gastroenterology;  Laterality: N/A;   ESOPHAGOGASTRODUODENOSCOPY (EGD) WITH PROPOFOL N/A 02/08/2022   Procedure: ESOPHAGOGASTRODUODENOSCOPY (EGD) WITH PROPOFOL;  Surgeon: Wyline Mood, MD;  Location: [facility] ENDOSCOPY;  Service: Gastroenterology;  Laterality: N/A;   TUBAL LIGATION     UPPER GI ENDOSCOPY      Family History  Problem Relation Age of Onset   Heart failure Mother    Heart disease Mother    Stroke Mother    Heart disease Brother    COPD Brother    Cancer Maternal Aunt        Breast Cancer    Allergies  Allergen Reactions   Other Anaphylaxis   Penicillins  Anaphylaxis, Rash and Other (See Comments)    Reaction:  Tongue swelling  Has patient had a PCN reaction causing immediate rash, facial/tongue/throat swelling, SOB or lightheadedness with hypotension:  Yes   Has patient had a PCN reaction causing severe rash involving mucus membranes or skin necrosis: No Has patient had a PCN reaction that required hospitalization No Has patient had a  PCN reaction occurring within the last 10 years: No If all of the above answers are "NO", then may proceed with Cephalosporin use.   Yellow Jacket Venom [Bee Venom] Anaphylaxis   Codeine Hives   Crestor [Rosuvastatin] Nausea And Vomiting    Corrected prior adverse reaction per [facility] [employer]. On 12/02/3011  patient reported N & V when she takes Crestor   Gemfibrozil Rash, Nausea And Vomiting and Swelling   Trazodone And Nefazodone Nausea And Vomiting   Lipitor [Atorvastatin] Nausea Only    By patient report 12/02/2011. Had also been prescribed pravastatin and lovastatin by previous MD. Unclear if those caused same side effects.        Latest Ref Rng & Units 07/10/2022    4:45 PM 02/08/2022    4:00 AM 02/07/2022    1:34 PM  CBC  WBC 4.0 - 10.5 K/uL 9.0  8.7  12.0   Hemoglobin 12.0 - 15.0 g/dL 10.1  75.1  02.5   Hematocrit 36.0 - 46.0 % 40.9  38.6  43.7   Platelets 150 - 400 K/uL 284  196  259       CMP     Component Value Date/Time   NA 137 07/10/2022 1645   NA 146 (H) 05/09/2021 1342   NA 137 02/02/2012 1114   K 4.4 07/10/2022 1645   K 3.9 02/02/2012 1114   CL 98 07/10/2022 1645   CL 103 02/02/2012 1114   CO2 25 07/10/2022 1645   CO2 24 02/02/2012 1114   GLUCOSE 132 (H) 07/10/2022 1645   GLUCOSE 226 (H) 02/02/2012 1114   BUN 15 07/10/2022 1645   BUN 14 05/09/2021 1342   BUN 14 02/02/2012 1114   CREATININE 0.76 07/10/2022 1645   CREATININE 0.68 02/02/2012 1114   CALCIUM 9.5 07/10/2022 1645   CALCIUM 8.6 02/02/2012 1114   PROT 8.5 (H) 07/10/2022 1645   PROT 7.4 05/09/2021 1342    ALBUMIN 5.0 07/10/2022 1645   ALBUMIN 4.6 05/09/2021 1342   AST 29 07/10/2022 1645   ALT 14 07/10/2022 1645   ALKPHOS 83 07/10/2022 1645   BILITOT 1.0 07/10/2022 1645   BILITOT 0.4 05/09/2021 1342   EGFR 101 05/09/2021 1342   GFRNONAA >60 07/10/2022 1645   GFRNONAA >60 02/02/2012 1114     No results found.     Assessment & Plan:   1. Bilateral carotid artery stenosis Had a long discussion with the patient regarding carotid disease.  The noninvasive studies done at the outside facility indicate that there is likely very minimal stenosis.  Given that this is the first look we will try to have patient return in 3 months to repeat noninvasive studies.  Discussed with patient that if it continues to show minimal stenosis she will be followed up annually.  We also discussed that typically with asymptomatic carotid disease, we typically plan on repair at 75% or greater of stenosis.  Currently patient is on adequate medication including Zetia and Plavix and no medication changes indicated at this time.  2. Type 2 diabetes mellitus with diabetic microalbuminuria, without long-term current use of insulin (HCC) Continue hypoglycemic medications as already ordered, these medications have been reviewed and there are no changes at this time.  Hgb A1C to be monitored as already arranged by primary service  3. Vertebral artery stenosis, right Previous notes are consistent with studies that indicate occlusion of the right vertebral artery.  I have discussed with patient that once these arteries are occluded we are unable  to intervene due to the risk of causing significant injury.  Patient will continue to notice fatigue with plan as noted above.   Current Outpatient Medications on File Prior to Visit  Medication Sig Dispense Refill   albuterol (PROVENTIL) (2.5 MG/3ML) 0.083% nebulizer solution Take 2.5 mg by nebulization every 6 (six) hours.     busPIRone (BUSPAR) 5 MG tablet Take 5 mg by mouth 3  (three) times daily.     clopidogrel (PLAVIX) 75 MG tablet Take 1 tablet (75 mg total) by mouth daily. 90 tablet 4   diclofenac Sodium (VOLTAREN) 1 % GEL Apply 4 g topically 4 (four) times daily. (Patient taking differently: Apply 2 g topically every 4 (four) hours as needed (pain). (Apply to lower back/back of neck)) 350 g 3   econazole nitrate 1 % cream Apply 1 Application topically 2 (two) times daily.     ezetimibe (ZETIA) 10 MG tablet Take 10 mg by mouth daily.     Fluticasone-Umeclidin-Vilant (TRELEGY ELLIPTA) 200-62.5-25 MCG/ACT AEPB Inhale 1 puff into the lungs daily. 30 each 12   gabapentin (NEURONTIN) 300 MG capsule Take 300 mg by mouth 2 (two) times daily.     levothyroxine (SYNTHROID) 50 MCG tablet Take 50 mcg by mouth daily before breakfast.     lidocaine 4 % Place 1 patch onto the skin daily. (Apply to neck and remove after 12 hours)     magnesium oxide (MAG-OX) 400 (240 Mg) MG tablet Take 400 mg by mouth daily.     metFORMIN (GLUCOPHAGE) 500 MG tablet Take 500 mg by mouth 2 (two) times daily with a meal.     metoprolol succinate (TOPROL XL) 50 MG 24 hr tablet Take 1 tablet (50 mg total) by mouth daily. Take with or immediately following a meal. 30 tablet 11   montelukast (SINGULAIR) 10 MG tablet Take 10 mg by mouth at bedtime.     nystatin (MYCOSTATIN/NYSTOP) powder Apply 1 Application topically every 12 (twelve) hours as needed (for fungal rash).     omeprazole (PRILOSEC) 40 MG capsule Take 1 capsule (40 mg total) by mouth 2 (two) times daily. Take in the morning and at dinnertime. (Patient taking differently: Take 40 mg by mouth in the morning and at bedtime.) 120 capsule 0   ondansetron (ZOFRAN) 4 MG tablet Take 4 mg by mouth every 6 (six) hours as needed for nausea.     oxyCODONE-acetaminophen (PERCOCET) 10-325 MG tablet TAKE ONE TABLET BY MOUTH EVERY 4 HOURS AS NEEDED (VIAL) (Patient taking differently: Take 1 tablet by mouth every 6 (six) hours as needed for pain.) 180 tablet 0    polyethylene glycol (MIRALAX / GLYCOLAX) 17 g packet Take 17 g by mouth daily as needed for mild constipation.     senna (SENOKOT) 8.6 MG TABS tablet Take 2 tablets by mouth daily.     sertraline (ZOLOFT) 25 MG tablet Take 25 mg by mouth daily.     sertraline (ZOLOFT) 50 MG tablet Take 1 tablet (50 mg total) by mouth daily. 90 tablet 4   umeclidinium-vilanterol (ANORO ELLIPTA) 62.5-25 MCG/ACT AEPB Inhale 1 puff into the lungs daily.     vitamin B-12 (CYANOCOBALAMIN) 1000 MCG tablet Take 1 tablet (1,000 mcg total) by mouth daily. 30 tablet 0   zaleplon (SONATA) 5 MG capsule Take 5 mg by mouth at bedtime.     clonazePAM (KLONOPIN) 0.5 MG tablet TAKE ONE TABLET (0.5 mg total) BY MOUTH TWICE DAILY AS NEEDED (Patient not taking: Reported on 11/11/2022)  No current facility-administered medications on file prior to visit.    There are no Patient Instructions on file for this visit. No follow-ups on file.   Georgiana Spinner, NP

## 2022-11-18 DIAGNOSIS — M6281 Muscle weakness (generalized): Secondary | ICD-10-CM | POA: Diagnosis not present

## 2022-11-19 DIAGNOSIS — M6281 Muscle weakness (generalized): Secondary | ICD-10-CM | POA: Diagnosis not present

## 2022-11-20 ENCOUNTER — Encounter
Admission: RE | Disposition: A | Discharge status: Home / Self Care | Payer: Self-pay | Source: Home / Self Care | Attending: Internal Medicine

## 2022-11-20 ENCOUNTER — Ambulatory Visit: Payer: 59 | Admitting: Anesthesiology

## 2022-11-20 ENCOUNTER — Ambulatory Visit
Admission: RE | Admit: 2022-11-20 | Discharge: 2022-11-20 | Disposition: A | Discharge status: Home / Self Care | Payer: 59 | Attending: Internal Medicine | Admitting: Internal Medicine

## 2022-11-20 ENCOUNTER — Encounter: Payer: Self-pay | Admitting: Internal Medicine

## 2022-11-20 DIAGNOSIS — K2289 Other specified disease of esophagus: Secondary | ICD-10-CM | POA: Diagnosis not present

## 2022-11-20 DIAGNOSIS — M6281 Muscle weakness (generalized): Secondary | ICD-10-CM | POA: Diagnosis not present

## 2022-11-20 DIAGNOSIS — Z87891 Personal history of nicotine dependence: Secondary | ICD-10-CM | POA: Diagnosis not present

## 2022-11-20 DIAGNOSIS — I2510 Atherosclerotic heart disease of native coronary artery without angina pectoris: Secondary | ICD-10-CM | POA: Diagnosis not present

## 2022-11-20 DIAGNOSIS — E1151 Type 2 diabetes mellitus with diabetic peripheral angiopathy without gangrene: Secondary | ICD-10-CM | POA: Diagnosis not present

## 2022-11-20 DIAGNOSIS — Z79899 Other long term (current) drug therapy: Secondary | ICD-10-CM | POA: Insufficient documentation

## 2022-11-20 DIAGNOSIS — Z8673 Personal history of transient ischemic attack (TIA), and cerebral infarction without residual deficits: Secondary | ICD-10-CM | POA: Diagnosis not present

## 2022-11-20 DIAGNOSIS — I10 Essential (primary) hypertension: Secondary | ICD-10-CM | POA: Insufficient documentation

## 2022-11-20 DIAGNOSIS — E119 Type 2 diabetes mellitus without complications: Secondary | ICD-10-CM | POA: Diagnosis not present

## 2022-11-20 DIAGNOSIS — Z538 Procedure and treatment not carried out for other reasons: Secondary | ICD-10-CM | POA: Insufficient documentation

## 2022-11-20 DIAGNOSIS — Z6839 Body mass index (BMI) 39.0-39.9, adult: Secondary | ICD-10-CM | POA: Insufficient documentation

## 2022-11-20 DIAGNOSIS — Z85118 Personal history of other malignant neoplasm of bronchus and lung: Secondary | ICD-10-CM | POA: Diagnosis not present

## 2022-11-20 DIAGNOSIS — L603 Nail dystrophy: Secondary | ICD-10-CM | POA: Diagnosis not present

## 2022-11-20 DIAGNOSIS — K2940 Chronic atrophic gastritis without bleeding: Secondary | ICD-10-CM | POA: Diagnosis not present

## 2022-11-20 DIAGNOSIS — R1310 Dysphagia, unspecified: Secondary | ICD-10-CM | POA: Diagnosis not present

## 2022-11-20 DIAGNOSIS — E785 Hyperlipidemia, unspecified: Secondary | ICD-10-CM | POA: Diagnosis not present

## 2022-11-20 DIAGNOSIS — J439 Emphysema, unspecified: Secondary | ICD-10-CM | POA: Insufficient documentation

## 2022-11-20 DIAGNOSIS — Z7984 Long term (current) use of oral hypoglycemic drugs: Secondary | ICD-10-CM | POA: Diagnosis not present

## 2022-11-20 DIAGNOSIS — F419 Anxiety disorder, unspecified: Secondary | ICD-10-CM | POA: Diagnosis not present

## 2022-11-20 DIAGNOSIS — K2950 Unspecified chronic gastritis without bleeding: Secondary | ICD-10-CM | POA: Diagnosis not present

## 2022-11-20 DIAGNOSIS — J45909 Unspecified asthma, uncomplicated: Secondary | ICD-10-CM | POA: Insufficient documentation

## 2022-11-20 DIAGNOSIS — L602 Onychogryphosis: Secondary | ICD-10-CM | POA: Diagnosis not present

## 2022-11-20 DIAGNOSIS — K222 Esophageal obstruction: Secondary | ICD-10-CM | POA: Diagnosis not present

## 2022-11-20 DIAGNOSIS — K219 Gastro-esophageal reflux disease without esophagitis: Secondary | ICD-10-CM | POA: Insufficient documentation

## 2022-11-20 HISTORY — PX: BIOPSY: SHX5522

## 2022-11-20 HISTORY — PX: ESOPHAGOGASTRODUODENOSCOPY (EGD) WITH PROPOFOL: SHX5813

## 2022-11-20 HISTORY — PX: BALLOON DILATION: SHX5330

## 2022-11-20 LAB — GLUCOSE, CAPILLARY: Glucose-Capillary: 122 mg/dL — ABNORMAL HIGH (ref 70–99)

## 2022-11-20 SURGERY — ESOPHAGOGASTRODUODENOSCOPY (EGD) WITH PROPOFOL
Anesthesia: General

## 2022-11-20 MED ORDER — SODIUM CHLORIDE 0.9 % IV SOLN: INTRAVENOUS | Status: DC

## 2022-11-20 MED ORDER — PROPOFOL 1000 MG/100ML IV EMUL
INTRAVENOUS | Status: AC
  Filled 2022-11-20: qty 100

## 2022-11-20 MED ORDER — LIDOCAINE HCL URETHRAL/MUCOSAL 2 % EX GEL
CUTANEOUS | Status: AC
Start: 2022-11-20 — End: ?
  Filled 2022-11-20: qty 5

## 2022-11-20 MED ORDER — PROPOFOL 10 MG/ML IV BOLUS
INTRAVENOUS | Status: DC | PRN
  Administered 2022-11-20 (×2): 50 mg via INTRAVENOUS
  Administered 2022-11-20: 100 mg via INTRAVENOUS

## 2022-11-20 MED ORDER — LIDOCAINE HCL (CARDIAC) PF 100 MG/5ML IV SOSY
PREFILLED_SYRINGE | INTRAVENOUS | Status: DC | PRN
  Administered 2022-11-20: 100 mg via INTRAVENOUS

## 2022-11-20 NOTE — Anesthesia Postprocedure Evaluation (Signed)
Anesthesia Post Note  Patient: Yvette Riley  Procedure(s) Performed: ESOPHAGOGASTRODUODENOSCOPY (EGD) WITH PROPOFOL BIOPSY BALLOON DILATION  Anesthesia Type: General Anesthetic complications: no   There were no known notable events for this encounter.   Last Vitals:  Vitals:   11/20/22 0933 11/20/22 0944  BP: 120/87 101/74  Pulse: 95 94  Resp: 12 15  Temp:    SpO2: 96% 96%    Last Pain:  Vitals:   11/20/22 0933  TempSrc:   PainSc: 0-No pain                 VAN STAVEREN,Edessa Jakubowicz

## 2022-11-20 NOTE — Interval H&P Note (Signed)
History and Physical Interval Note:  11/20/2022 8:53 AM  Yvette Riley  has presented today for surgery, with the diagnosis of R13.10 (ICD-10-CM) - Dysphagia, unspecified type.  The various methods of treatment have been discussed with the patient and family. After consideration of risks, benefits and other options for treatment, the patient has consented to  Procedure(s): ESOPHAGOGASTRODUODENOSCOPY (EGD) WITH PROPOFOL (N/A) as a surgical intervention.  The patient's history has been reviewed, patient examined, no change in status, stable for surgery.  I have reviewed the patient's chart and labs.  Questions were answered to the patient's satisfaction.     [place], [place]

## 2022-11-20 NOTE — Op Note (Signed)
[facility] Patient Name: Yvette Riley Procedure Date: 11/20/2022 8:54 AM MRN: [id number] Account #: [id number] Date of Birth: 1955-04-20 Admit Type: Outpatient Age: 67 Room: [facility] ENDO ROOM 2 Gender: Female Note Status: Finalized Instrument Name: Laurette Schimke [id number] Procedure:             Upper GI endoscopy Indications:           Dysphagia, Gastro-esophageal reflux disease Providers:             Boykin Nearing. Norma Fredrickson MD, MD Referring MD:          Sherol Dade DO Medicines:             Propofol per Anesthesia Complications:         No immediate complications. Estimated blood loss:                         Minimal. Procedure:             Pre-Anesthesia Assessment:                        - The risks and benefits of the procedure and the                         sedation options and risks were discussed with the                         patient. All questions were answered and informed                         consent was obtained.                        - Patient identification and proposed procedure were                         verified prior to the procedure by the nurse. The                         procedure was verified in the procedure room.                        - ASA Grade Assessment: III - A patient with severe                         systemic disease.                        - After reviewing the risks and benefits, the patient                         was deemed in satisfactory condition to undergo the                         procedure.                        After obtaining informed consent, the endoscope was                         passed under direct vision. Throughout  the procedure,                         the patient's blood pressure, pulse, and oxygen                         saturations were monitored continuously. The Endoscope                         was introduced through the mouth, and advanced to the                          third part of duodenum. The upper GI endoscopy was                         accomplished without difficulty. The patient tolerated                         the procedure well. Findings:      The examined duodenum was normal.      Diffuse atrophic mucosa was found in the gastric fundus and in the       gastric body. Two biopsies were obtained with cold forceps for histology       in the gastric body.      The exam of the stomach was otherwise normal.      The examined duodenum was normal.      Localized mild erythema was found in the upper third of the esophagus.       Two biopsies were obtained in the upper third of the esophagus with cold       forceps for evaluation of eosinophilic esophagitis. Estimated blood loss       was minimal.      Normal mucosa was found in the lower third of the esophagus. Two       biopsies were obtained with cold forceps for evaluation of eosinophilic       esophagitis in the lower third of the esophagus. Estimated blood loss       was minimal.      One benign-appearing, intrinsic mild stenosis was found at the       gastroesophageal junction. This stenosis measured 1.5 cm (inner       diameter) x less than one cm (in length). The stenosis was traversed.       The scope was withdrawn. Dilation was attempted, but the lesion was not       amenable to treatment with a Maloney dilator because the dilator could       not be passed at 54 Fr. The scope was withdrawn. Dilation was attempted,       but the lesion was not amenable to treatment with a Maloney dilator       because the dilator could not be passed at 50 Fr. The scope was       withdrawn. Dilation was performed with a Maloney dilator with moderate       resistance at 46 Fr. The dilation site was examined following endoscope       reinsertion and showed moderate mucosal disruption. Estimated blood loss       was minimal. Impression:            - Normal examined duodenum.                        -  Gastric  mucosal atrophy.                        - Normal examined duodenum.                        - Erythema in the upper third of the esophagus.                        - Normal mucosa was found in the lower third of the                         esophagus.                        - Benign-appearing esophageal stenosis. Unable to                         dilate. Dilated.                        - Biopsies performed in the gastric body.                        - Two biopsies were obtained in the upper third of the                         esophagus.                        - Biopsies performed in the lower third of the                         esophagus. Recommendation:        - Patient has a contact number available for                         emergencies. The signs and symptoms of potential                         delayed complications were discussed with the patient.                         Return to normal activities tomorrow. Written                         discharge instructions were provided to the patient.                        - Advance diet as tolerated today.                        - Mechanical soft diet for 1 day, then advance as                         tolerated to resume regular diet.                        - Continue present medications.                        -  Await pathology results.                        - Follow up with Tawni Pummel, PA-C at [facility]. [phone].                        - Telephone GI office to schedule appointment in 2                         months.                        - The findings and recommendations were discussed with                         the patient. Procedure Code(s):     --- Professional ---                        [phone], Esophagogastroduodenoscopy, flexible,                         transoral; with biopsy, single or multiple                        [id number], Dilation of esophagus, by unguided sound or                          bougie, single or multiple passes Diagnosis Code(s):     --- Professional ---                        K21.9, Gastro-esophageal reflux disease without                         esophagitis                        R13.10, Dysphagia, unspecified                        K22.2, Esophageal obstruction                        K22.89, Other specified disease of esophagus                        K31.89, Other diseases of stomach and duodenum CPT copyright 2022 American [employer] Association. All rights reserved. The codes documented in this report are preliminary and upon coder review may  be revised to meet current compliance requirements. Stanton Kidney MD, MD 11/20/2022 9:23:46 AM This report has been signed electronically. Number of Addenda: 0 Note Initiated On: 11/20/2022 8:54 AM Estimated Blood Loss:  Estimated blood loss was minimal.      [facility]

## 2022-11-20 NOTE — H&P (Signed)
Outpatient short stay form Pre-procedure 11/20/2022 8:51 AM Yvette Riley, M.D.  Primary Physician: Sherol Dade, D.O.  Reason for visit:  04/2022-EGD--Impression: - Food in the upper third of the esophagus. Removal was successful. - Normal stomach. - Normal examined duodenum. Recommendation: - Discharge patient to home. - Mechanical soft diet. - Continue present medications. - Repeat upper endoscopy in 1 month for retreatment  with Dr. Tobi Bastos.   History of present illness:  We reviewed that she is had ER visits requiring EGD for food bolus on January 12, April 19 and June 12 of this year. We are not able to advance her diet until she has a repeat EGD for evaluation/potential dilation. She is on Plavix and will need to be held for 5 days prior to repeat EGD, clearance sent to her PCP. We have arranged a repeat EGD with Dr. Norma Riley who she requests. Continue PPI twice daily in interim.     Current Facility-Administered Medications:    0.9 %  sodium chloride infusion, , Intravenous, Continuous, [place], Boykin Nearing, MD, Last Rate: 20 mL/hr at 11/20/22 0808, New Bag at 11/20/22 0808  Medications Prior to Admission  Medication Sig Dispense Refill Last Dose   busPIRone (BUSPAR) 5 MG tablet Take 5 mg by mouth 3 (three) times daily.   11/19/2022   ezetimibe (ZETIA) 10 MG tablet Take 10 mg by mouth daily.   11/19/2022   gabapentin (NEURONTIN) 300 MG capsule Take 300 mg by mouth 2 (two) times daily.   11/19/2022   levothyroxine (SYNTHROID) 50 MCG tablet Take 50 mcg by mouth daily before breakfast.   11/19/2022   magnesium oxide (MAG-OX) 400 (240 Mg) MG tablet Take 400 mg by mouth daily.   11/19/2022   metFORMIN (GLUCOPHAGE) 500 MG tablet Take 500 mg by mouth 2 (two) times daily with a meal.   11/19/2022   metoprolol succinate (TOPROL XL) 50 MG 24 hr tablet Take 1 tablet (50 mg total) by mouth daily. Take with or immediately following a meal. 30 tablet 11 11/19/2022   montelukast  (SINGULAIR) 10 MG tablet Take 10 mg by mouth at bedtime.   11/19/2022   ondansetron (ZOFRAN) 4 MG tablet Take 4 mg by mouth every 6 (six) hours as needed for nausea.   11/19/2022   sertraline (ZOLOFT) 25 MG tablet Take 25 mg by mouth daily.   11/19/2022   sertraline (ZOLOFT) 50 MG tablet Take 1 tablet (50 mg total) by mouth daily. 90 tablet 4 11/19/2022   zaleplon (SONATA) 5 MG capsule Take 5 mg by mouth at bedtime.   11/19/2022   albuterol (PROVENTIL) (2.5 MG/3ML) 0.083% nebulizer solution Take 2.5 mg by nebulization every 6 (six) hours.      clonazePAM (KLONOPIN) 0.5 MG tablet TAKE ONE TABLET (0.5 mg total) BY MOUTH TWICE DAILY AS NEEDED (Patient not taking: Reported on 11/11/2022)      clopidogrel (PLAVIX) 75 MG tablet Take 1 tablet (75 mg total) by mouth daily. 90 tablet 4 11/14/2022   diclofenac Sodium (VOLTAREN) 1 % GEL Apply 4 g topically 4 (four) times daily. (Patient taking differently: Apply 2 g topically every 4 (four) hours as needed (pain). (Apply to lower back/back of neck)) 350 g 3    econazole nitrate 1 % cream Apply 1 Application topically 2 (two) times daily.      Fluticasone-Umeclidin-Vilant (TRELEGY ELLIPTA) 200-62.5-25 MCG/ACT AEPB Inhale 1 puff into the lungs daily. 30 each 12    lidocaine 4 % Place 1 patch onto the skin  daily. (Apply to neck and remove after 12 hours)      nystatin (MYCOSTATIN/NYSTOP) powder Apply 1 Application topically every 12 (twelve) hours as needed (for fungal rash).      omeprazole (PRILOSEC) 40 MG capsule Take 1 capsule (40 mg total) by mouth 2 (two) times daily. Take in the morning and at dinnertime. (Patient taking differently: Take 40 mg by mouth in the morning and at bedtime.) 120 capsule 0    oxyCODONE-acetaminophen (PERCOCET) 10-325 MG tablet TAKE ONE TABLET BY MOUTH EVERY 4 HOURS AS NEEDED (VIAL) (Patient taking differently: Take 1 tablet by mouth every 6 (six) hours as needed for pain.) 180 tablet 0    polyethylene glycol (MIRALAX / GLYCOLAX) 17  g packet Take 17 g by mouth daily as needed for mild constipation.      senna (SENOKOT) 8.6 MG TABS tablet Take 2 tablets by mouth daily.      umeclidinium-vilanterol (ANORO ELLIPTA) 62.5-25 MCG/ACT AEPB Inhale 1 puff into the lungs daily.      vitamin B-12 (CYANOCOBALAMIN) 1000 MCG tablet Take 1 tablet (1,000 mcg total) by mouth daily. 30 tablet 0      Allergies  Allergen Reactions   Other Anaphylaxis   Penicillins Anaphylaxis, Rash and Other (See Comments)    Reaction:  Tongue swelling  Has patient had a PCN reaction causing immediate rash, facial/tongue/throat swelling, SOB or lightheadedness with hypotension:  Yes   Has patient had a PCN reaction causing severe rash involving mucus membranes or skin necrosis: No Has patient had a PCN reaction that required hospitalization No Has patient had a PCN reaction occurring within the last 10 years: No If all of the above answers are "NO", then may proceed with Cephalosporin use.   Yellow Jacket Venom [Bee Venom] Anaphylaxis   Codeine Hives   Crestor [Rosuvastatin] Nausea And Vomiting    Corrected prior adverse reaction per [facility] [employer]. On 12/02/3011  patient reported N & V when she takes Crestor   Gemfibrozil Rash, Nausea And Vomiting and Swelling   Trazodone And Nefazodone Nausea And Vomiting   Lipitor [Atorvastatin] Nausea Only    By patient report 12/02/2011. Had also been prescribed pravastatin and lovastatin by previous MD. Unclear if those caused same side effects.      Past Medical History:  Diagnosis Date   Allergy    Anemia    Anxiety    Arthritis    Asthma    Blood transfusion without reported diagnosis    C. difficile diarrhea 04/07/2016   CAP (community acquired pneumonia) 03/21/2015   Combined forms of age-related cataract of both eyes 05/06/2016   COPD (chronic obstructive pulmonary disease) (HCC)    CVA (cerebral vascular accident) (HCC) 06/17/2018   Diabetes mellitus without complication (HCC)    Displacement of  lumbar intervertebral disc without myelopathy    Emphysema of lung (HCC)    GERD (gastroesophageal reflux disease)    Glaucoma    History of chicken pox    Hyperlipidemia    Hypertension    Pneumonia 03/29/2015   Sepsis (HCC) 03/17/2016   Shortness of breath    Small cell lung cancer (HCC)     Review of systems:  Otherwise negative.    Physical Exam  Gen: Alert, oriented. Appears stated age.  HEENT: [place]/AT. PERRLA. Lungs: CTA, no wheezes. CV: RR nl S1, S2. Abd: soft, benign, no masses. BS+ Ext: No edema. Pulses 2+    Planned procedures: Proceed with  EGD. The patient understands the  nature of the planned procedure, indications, risks, alternatives and potential complications including but not limited to bleeding, infection, perforation, damage to internal organs and possible oversedation/side effects from anesthesia. The patient agrees and gives consent to proceed.  Please refer to procedure notes for findings, recommendations and patient disposition/instructions.     Yvette Riley K. Norma Riley, M.D. Gastroenterology 11/20/2022  8:51 AM

## 2022-11-20 NOTE — Anesthesia Preprocedure Evaluation (Addendum)
Anesthesia Evaluation  Patient identified by MRN, date of birth, ID band Patient awake    Reviewed: Allergy & Precautions, NPO status , Patient's Chart, lab work & pertinent test results  History of Anesthesia Complications (+) DIFFICULT AIRWAY and history of anesthetic complications  Airway Mallampati: III  TM Distance: >3 FB Neck ROM: full  Mouth opening: Limited Mouth Opening  Dental  (+) Upper Dentures, Lower Dentures   Pulmonary neg pulmonary ROS, shortness of breath and with exertion, COPD,  COPD inhaler, Patient abstained from smoking., former smoker Hx of lung cancer   Pulmonary exam normal + rhonchi        Cardiovascular Exercise Tolerance: Poor hypertension, Pt. on medications + CAD and + DOE  negative cardio ROS Normal cardiovascular exam Rhythm:Regular Rate:Normal     Neuro/Psych   Anxiety     CVA, Residual Symptoms negative neurological ROS  negative psych ROS   GI/Hepatic negative GI ROS, Neg liver ROS,GERD  Medicated,,  Endo/Other  negative endocrine ROSdiabetes, Well Controlled, Type 2, Oral Hypoglycemic Agents  Morbid obesity  Renal/GU negative Renal ROS  negative genitourinary   Musculoskeletal   Abdominal  (+) + obese  Peds negative pediatric ROS (+)  Hematology negative hematology ROS (+)   Anesthesia Other Findings Past Medical History: No date: Allergy No date: Anemia No date: Anxiety No date: Arthritis No date: Asthma No date: Blood transfusion without reported diagnosis 04/07/2016: C. difficile diarrhea 03/21/2015: CAP (community acquired pneumonia) 05/06/2016: Combined forms of age-related cataract of both eyes No date: COPD (chronic obstructive pulmonary disease) (HCC) 06/17/2018: CVA (cerebral vascular accident) (HCC) No date: Diabetes mellitus without complication (HCC) No date: Displacement of lumbar intervertebral disc without myelopathy No date: Emphysema of lung (HCC) No date:  GERD (gastroesophageal reflux disease) No date: Glaucoma No date: History of chicken pox No date: Hyperlipidemia No date: Hypertension 03/29/2015: Pneumonia 03/17/2016: Sepsis (HCC) No date: Shortness of breath No date: Small cell lung cancer [facility])  Past Surgical History: No date: CESAREAN SECTION 05/17/2022: ESOPHAGOGASTRODUODENOSCOPY; N/A     Comment:  Procedure: ESOPHAGOGASTRODUODENOSCOPY (EGD);  Surgeon:               [name], MD;  Location: [facility] ENDOSCOPY;  Service:               Endoscopy;  Laterality: N/A; 07/10/2022: ESOPHAGOGASTRODUODENOSCOPY; N/A     Comment:  Procedure: ESOPHAGOGASTRODUODENOSCOPY (EGD);  Surgeon:               [place], Boykin Nearing, MD;  Location: [facility] ENDOSCOPY;                Service: Gastroenterology;  Laterality: N/A; 02/08/2022: ESOPHAGOGASTRODUODENOSCOPY (EGD) WITH PROPOFOL; N/A     Comment:  Procedure: ESOPHAGOGASTRODUODENOSCOPY (EGD) WITH               PROPOFOL;  Surgeon: Wyline Mood, MD;  Location: [facility]               ENDOSCOPY;  Service: Gastroenterology;  Laterality: N/A; No date: TUBAL LIGATION No date: UPPER GI ENDOSCOPY     Reproductive/Obstetrics negative OB ROS                             Anesthesia Physical Anesthesia Plan  ASA: 3  Anesthesia Plan: General   Post-op Pain Management:    Induction: Intravenous  PONV Risk Score and Plan: Propofol infusion and TIVA  Airway Management Planned: Natural Airway and Nasal Cannula  Additional Equipment:   Intra-op Plan:   Post-operative Plan:   Informed Consent: I have reviewed the patients History and Physical, chart, labs and discussed the procedure including the risks, benefits and alternatives for the proposed anesthesia with the patient or authorized representative who has indicated his/her understanding and acceptance.     Dental Advisory Given  Plan Discussed with: Anesthesiologist, CRNA and Surgeon  Anesthesia Plan Comments:         Anesthesia Quick Evaluation

## 2022-11-20 NOTE — Transfer of Care (Signed)
Immediate Anesthesia Transfer of Care Note  Patient: Yvette Riley  Procedure(s) Performed: ESOPHAGOGASTRODUODENOSCOPY (EGD) WITH PROPOFOL BIOPSY BALLOON DILATION  Patient Location: PACU  Anesthesia Type:General  Level of Consciousness: awake and sedated  Airway & Oxygen Therapy: Patient Spontanous Breathing and Patient connected to face mask oxygen  Post-op Assessment: Report given to RN and Post -op Vital signs reviewed and stable  Post vital signs: Reviewed and stable  Last Vitals:  Vitals Value Taken Time  BP    Temp    Pulse    Resp    SpO2      Last Pain:  Vitals:   11/20/22 0804  TempSrc: Temporal  PainSc: 7          Complications: There were no known notable events for this encounter.

## 2022-11-21 ENCOUNTER — Encounter: Payer: Self-pay | Admitting: Internal Medicine

## 2022-11-21 ENCOUNTER — Telehealth: Payer: Self-pay | Admitting: Pharmacist

## 2022-11-21 DIAGNOSIS — Z23 Encounter for immunization: Secondary | ICD-10-CM | POA: Diagnosis not present

## 2022-11-21 DIAGNOSIS — M6281 Muscle weakness (generalized): Secondary | ICD-10-CM | POA: Diagnosis not present

## 2022-11-21 NOTE — Progress Notes (Signed)
11/21/2022  Patient ID: Nonah Mattes, female   DOB: Nov 22, 1955, 67 y.o.   MRN: [id number]  Pharmacy Quality Measure Review  This patient is appearing on report for being at risk of failing the measure for Statin Use in Persons with Diabetes (SUPD) medications this calendar year.   Prior trials of: Atorvastatin, Rosuvastatin, Gemfibrozil, Pravastatin, Lovastatin  Could add diagnosis code of T46.6X5A due to history of rash, nausea, and vomiting with statin medications. However, would not exclude patient from needing to take the medications for diabetes  Do NOT believe the patient is taking Ezetimibe. Currently in a nursing home for the past 6 months, so fill history is NOT filtering into history.   Patient said I can call [facility] and ask for second floor nurse for an updated medication list. Says when she leaves visits, the documents are sealed and handed to caregivers at the facility. Therefore, she does NOT know if she is taking Zetia  **Patient will fail the metric for this year, but no longer a [facility] patient for next year   Marlowe Aschoff, PharmD [facility] Phone Number: [phone]

## 2022-11-22 LAB — SURGICAL PATHOLOGY

## 2022-12-01 DIAGNOSIS — G4733 Obstructive sleep apnea (adult) (pediatric): Secondary | ICD-10-CM | POA: Diagnosis not present

## 2022-12-12 ENCOUNTER — Ambulatory Visit (INDEPENDENT_AMBULATORY_CARE_PROVIDER_SITE_OTHER): Payer: 59

## 2022-12-12 DIAGNOSIS — J449 Chronic obstructive pulmonary disease, unspecified: Secondary | ICD-10-CM

## 2022-12-12 DIAGNOSIS — I663 Occlusion and stenosis of cerebellar arteries: Secondary | ICD-10-CM

## 2022-12-12 DIAGNOSIS — R0789 Other chest pain: Secondary | ICD-10-CM

## 2022-12-12 DIAGNOSIS — I2510 Atherosclerotic heart disease of native coronary artery without angina pectoris: Secondary | ICD-10-CM

## 2022-12-12 DIAGNOSIS — C3491 Malignant neoplasm of unspecified part of right bronchus or lung: Secondary | ICD-10-CM

## 2022-12-12 DIAGNOSIS — I6501 Occlusion and stenosis of right vertebral artery: Secondary | ICD-10-CM

## 2022-12-12 DIAGNOSIS — G4733 Obstructive sleep apnea (adult) (pediatric): Secondary | ICD-10-CM

## 2022-12-12 MED ORDER — TECHNETIUM TC 99M SESTAMIBI GENERIC - CARDIOLITE
11.0000 | Freq: Once | INTRAVENOUS | Status: AC | PRN
  Administered 2022-12-12: 11 via INTRAVENOUS

## 2022-12-12 MED ORDER — TECHNETIUM TC 99M SESTAMIBI GENERIC - CARDIOLITE
33.4000 | Freq: Once | INTRAVENOUS | Status: AC | PRN
  Administered 2022-12-12: 33.4 via INTRAVENOUS

## 2022-12-13 ENCOUNTER — Ambulatory Visit (INDEPENDENT_AMBULATORY_CARE_PROVIDER_SITE_OTHER): Payer: 59 | Admitting: Adult Health

## 2022-12-13 ENCOUNTER — Encounter: Payer: Self-pay | Admitting: Adult Health

## 2022-12-13 VITALS — BP 126/78 | HR 103 | Temp 97.8°F | Ht <= 58 in | Wt 191.0 lb

## 2022-12-13 DIAGNOSIS — F17210 Nicotine dependence, cigarettes, uncomplicated: Secondary | ICD-10-CM

## 2022-12-13 DIAGNOSIS — C3491 Malignant neoplasm of unspecified part of right bronchus or lung: Secondary | ICD-10-CM | POA: Diagnosis not present

## 2022-12-13 DIAGNOSIS — G4733 Obstructive sleep apnea (adult) (pediatric): Secondary | ICD-10-CM

## 2022-12-13 NOTE — Patient Instructions (Addendum)
Change CPAP pressure to 5 to 10 cmH2O.  Saline nasal gel At bedtime   Wear CPAP At bedtime  all night long for at least 6hr or more.   Continue on Trelegy 1 puff daily, rinse after use .  Albuterol inhaler As needed    Set up CT chest    Follow up with Dr. Aundria Rud  in 3 months and As needed

## 2022-12-13 NOTE — Progress Notes (Signed)
@Patient  ID: Yvette Riley, female    DOB: 19-Dec-1955, 67 y.o.   MRN: [id number]  Chief Complaint  Patient presents with   Follow-up   Discussed the use of AI scribe software for clinical note transcription with the patient, who gave verbal consent to proceed.  Referring provider: Sherol Dade, *  HPI: 67 year old female former smoker followed for COPD with asthma and sleep apnea. Medical history significant for small cell lung cancer status post chemoradiation (2018)  TEST/EVENTS :  Home sleep study July 21, 2022 showed mild sleep apnea with AHI at 7.0/hour and SpO2 low at 80%  PFTs October 22, 2022 showed moderate restriction with FEV1 at 67%, ratio 72, FVC's 70%, DLCO 60%.  CT chest Jun 20, 2022 showed radiation changes in the right upper lobe/perihilar region, mild groundglass opacities in the right upper and left lower lobe may reflect radiation changes no suspicious pulmonary nodules, enlarged paraesophageal node previously 6 mm now 10mm suspicious for nodal metastasis .     12/13/2022 Follow up : COPD with asthma, sleep apnea, history of small cell lung cancer  Patient presents for a 79-month follow-up.  Patient has history of COPD with asthma.  Says overall breathing is doing okay.  She remains on Trelegy inhaler daily.  Says she has a daily minimally productive cough that is chronic since having radiation therapy.  Denies any significant shortness of breath.  Is sedentary.   Patient was found to have mild obstructive sleep apnea was started on CPAP last visit.  Patient says she has been having trouble tolerating CPAP.  Says the pressure is way too high causes her to be very uncomfortable and she has been unable to wear it for any amount of time.  CPAP download shows poor compliance patient is on auto CPAP 6 to 20 cm H2O.  AHI 4.5/hour.  Positive mask leaks.  She is using a fullface mask. We discussed adjusting her CPAP pressure for comfort.  Also adding in saline  nasal gel for nasal dryness.  The patient also has a history of using oxygen therapy, which she found more tolerable than the CPAP. However, she is currently does not have oxygen.   The patient is a former smoker, having quit six years ago after a 40-year history of smoking a pack and a half a day.  (60-pack-year history)  As above patient has a history of small cell lung cancer diagnosed in 2018 status post chemoradiation.  CT chest Jun 20, 2022 showed radiation changes in the right hemothorax.  A small distal paraesophageal node that has increased in size from 6 mm to 10 mm.  We discussed setting patient up for a follow-up CT chest for monitoring.    Allergies  Allergen Reactions   Other Anaphylaxis   Penicillins Anaphylaxis, Rash and Other (See Comments)    Reaction:  Tongue swelling  Has patient had a PCN reaction causing immediate rash, facial/tongue/throat swelling, SOB or lightheadedness with hypotension:  Yes   Has patient had a PCN reaction causing severe rash involving mucus membranes or skin necrosis: No Has patient had a PCN reaction that required hospitalization No Has patient had a PCN reaction occurring within the last 10 years: No If all of the above answers are "NO", then may proceed with Cephalosporin use.   Yellow Jacket Venom [Bee Venom] Anaphylaxis   Codeine Hives   Crestor [Rosuvastatin] Nausea And Vomiting    Corrected prior adverse reaction per [facility] [employer]. On 12/02/3011  patient reported  N & V when she takes Crestor   Gemfibrozil Rash, Nausea And Vomiting and Swelling   Trazodone And Nefazodone Nausea And Vomiting   Lipitor [Atorvastatin] Nausea Only    By patient report 12/02/2011. Had also been prescribed pravastatin and lovastatin by previous MD. Unclear if those caused same side effects.     Immunization History  Administered Date(s) Administered   Fluad Quad(high Dose 65+) 12/07/2021   Influenza,inj,Quad PF,6+ Mos 12/13/2014, 11/23/2016, 10/13/2017,  12/04/2018, 02/27/2021   Influenza-Unspecified 01/28/2014   Moderna Sars-Covid-2 Vaccination 08/05/2019   Pneumococcal Polysaccharide-23 02/24/2014    Past Medical History:  Diagnosis Date   Allergy    Anemia    Anxiety    Arthritis    Asthma    Blood transfusion without reported diagnosis    C. difficile diarrhea 04/07/2016   CAP (community acquired pneumonia) 03/21/2015   Combined forms of age-related cataract of both eyes 05/06/2016   COPD (chronic obstructive pulmonary disease) (HCC)    CVA (cerebral vascular accident) (HCC) 06/17/2018   Diabetes mellitus without complication (HCC)    Displacement of lumbar intervertebral disc without myelopathy    Emphysema of lung (HCC)    GERD (gastroesophageal reflux disease)    Glaucoma    History of chicken pox    Hyperlipidemia    Hypertension    Pneumonia 03/29/2015   Sepsis (HCC) 03/17/2016   Shortness of breath    Small cell lung cancer (HCC)     Tobacco History: Social History   Tobacco Use  Smoking Status Former   Current packs/day: 0.00   Average packs/day: 0.3 packs/day for 45.0 years (11.3 ttl pk-yrs)   Types: Cigarettes   Start date: 03/18/1971   Quit date: 03/17/2016   Years since quitting: 6.7  Smokeless Tobacco Never  Tobacco Comments   pt states she quit in 2016-2017   Counseling given: Not Answered Tobacco comments: pt states she quit in 2016-2017   Outpatient Medications Prior to Visit  Medication Sig Dispense Refill   albuterol (PROVENTIL) (2.5 MG/3ML) 0.083% nebulizer solution Take 2.5 mg by nebulization every 6 (six) hours.     busPIRone (BUSPAR) 5 MG tablet Take 5 mg by mouth 3 (three) times daily.     clonazePAM (KLONOPIN) 0.5 MG tablet TAKE ONE TABLET (0.5 mg total) BY MOUTH TWICE DAILY AS NEEDED (Patient not taking: Reported on 11/11/2022)     clopidogrel (PLAVIX) 75 MG tablet Take 1 tablet (75 mg total) by mouth daily. 90 tablet 4   diclofenac Sodium (VOLTAREN) 1 % GEL Apply 4 g topically 4 (four)  times daily. (Patient taking differently: Apply 2 g topically every 4 (four) hours as needed (pain). (Apply to lower back/back of neck)) 350 g 3   econazole nitrate 1 % cream Apply 1 Application topically 2 (two) times daily.     ezetimibe (ZETIA) 10 MG tablet Take 10 mg by mouth daily.     Fluticasone-Umeclidin-Vilant (TRELEGY ELLIPTA) 200-62.5-25 MCG/ACT AEPB Inhale 1 puff into the lungs daily. 30 each 12   gabapentin (NEURONTIN) 300 MG capsule Take 300 mg by mouth 2 (two) times daily.     levothyroxine (SYNTHROID) 50 MCG tablet Take 50 mcg by mouth daily before breakfast.     lidocaine 4 % Place 1 patch onto the skin daily. (Apply to neck and remove after 12 hours)     magnesium oxide (MAG-OX) 400 (240 Mg) MG tablet Take 400 mg by mouth daily.     metFORMIN (GLUCOPHAGE) 500 MG tablet Take 500  mg by mouth 2 (two) times daily with a meal.     metoprolol succinate (TOPROL XL) 50 MG 24 hr tablet Take 1 tablet (50 mg total) by mouth daily. Take with or immediately following a meal. 30 tablet 11   montelukast (SINGULAIR) 10 MG tablet Take 10 mg by mouth at bedtime.     nystatin (MYCOSTATIN/NYSTOP) powder Apply 1 Application topically every 12 (twelve) hours as needed (for fungal rash).     omeprazole (PRILOSEC) 40 MG capsule Take 1 capsule (40 mg total) by mouth 2 (two) times daily. Take in the morning and at dinnertime. (Patient taking differently: Take 40 mg by mouth in the morning and at bedtime.) 120 capsule 0   ondansetron (ZOFRAN) 4 MG tablet Take 4 mg by mouth every 6 (six) hours as needed for nausea.     oxyCODONE-acetaminophen (PERCOCET) 10-325 MG tablet TAKE ONE TABLET BY MOUTH EVERY 4 HOURS AS NEEDED (VIAL) (Patient taking differently: Take 1 tablet by mouth every 6 (six) hours as needed for pain.) 180 tablet 0   polyethylene glycol (MIRALAX / GLYCOLAX) 17 g packet Take 17 g by mouth daily as needed for mild constipation.     senna (SENOKOT) 8.6 MG TABS tablet Take 2 tablets by mouth daily.      sertraline (ZOLOFT) 25 MG tablet Take 25 mg by mouth daily.     sertraline (ZOLOFT) 50 MG tablet Take 1 tablet (50 mg total) by mouth daily. 90 tablet 4   umeclidinium-vilanterol (ANORO ELLIPTA) 62.5-25 MCG/ACT AEPB Inhale 1 puff into the lungs daily.     vitamin B-12 (CYANOCOBALAMIN) 1000 MCG tablet Take 1 tablet (1,000 mcg total) by mouth daily. 30 tablet 0   zaleplon (SONATA) 5 MG capsule Take 5 mg by mouth at bedtime.     No facility-administered medications prior to visit.     Review of Systems:   Constitutional:   No  weight loss, night sweats,  Fevers, chills, +fatigue, or  lassitude.  HEENT:   No headaches,  Difficulty swallowing,  Tooth/dental problems, or  Sore throat,                No sneezing, itching, ear ache, nasal congestion, post nasal drip,   CV:  No chest pain,  Orthopnea, PND, swelling in lower extremities, anasarca, dizziness, palpitations, syncope.   GI  No heartburn, indigestion, abdominal pain, nausea, vomiting, diarrhea, change in bowel habits, loss of appetite, bloody stools.   Resp:   No chest wall deformity  Skin: no rash or lesions.  GU: no dysuria, change in color of urine, no urgency or frequency.  No flank pain, no hematuria   MS:  No joint pain or swelling.  No decreased range of motion.  No back pain.    Physical Exam  BP 126/78 (BP Location: Right Arm, Patient Position: Sitting, Cuff Size: Normal)   Pulse (!) 103   Temp 97.8 F (36.6 C) (Temporal)   Ht 4\' 9"  (1.448 m)   Wt 191 lb (86.6 kg)   SpO2 94%   BMI 41.33 kg/m   GEN: A/Ox3; pleasant , NAD, well nourished    HEENT:  /AT,  EACs-clear, TMs-wnl, NOSE-clear, THROAT-clear, no lesions, no postnasal drip or exudate noted.   NECK:  Supple w/ fair ROM; no JVD; normal carotid impulses w/o bruits; no thyromegaly or nodules palpated; no lymphadenopathy.    RESP scattered rhonchi bilaterally  no accessory muscle use, no dullness to percussion  CARD:  RRR, no m/r/g, no  peripheral  edema, pulses intact, no cyanosis or clubbing.  GI:   Soft & nt; nml bowel sounds; no organomegaly or masses detected.   Musco: Warm bil, no deformities or joint swelling noted.   Neuro: alert, no focal deficits noted.    Skin: Warm, no lesions or rashes    Lab Results:      BNP No results found for: "BNP"  ProBNP No results found for: "PROBNP"  Imaging: No results found.  albuterol (PROVENTIL) (2.5 MG/3ML) 0.083% nebulizer solution 2.5 mg     Date Action Dose Route User   Discharged on 11/20/2022   Admitted on 11/20/2022   10/22/2022 1424 Given 2.5 mg Nebulization Kearney Hard H, RT      technetium sestamibi generic (CARDIOLITE) injection 11 millicurie     Date Action Dose Route User   12/12/2022 0815 Contrast Given 11 millicurie Intravenous (Left Arm) Beckham, Adam      technetium sestamibi generic (CARDIOLITE) injection 33.4 millicurie     Date Action Dose Route User   12/12/2022 1000 Contrast Given 33.4 millicurie Intravenous (Left Arm) Beckham, Adam          Latest Ref Rng & Units 10/22/2022    2:37 PM  PFT Results  FVC-Pre L 1.55   FVC-Predicted Pre % 66   FVC-Post L 1.63   FVC-Predicted Post % 70   Pre FEV1/FVC % % 75   Post FEV1/FCV % % 72   FEV1-Pre L 1.16   FEV1-Predicted Pre % 65   FEV1-Post L 1.18   DLCO uncorrected ml/min/mmHg 9.36   DLCO UNC% % 60   DLVA Predicted % 79   TLC L 3.49   TLC % Predicted % 87   RV % Predicted % 104     No results found for: "NITRICOXIDE"      Assessment & Plan:  Assessment and Plan    Chronic Obstructive Pulmonary Disease (COPD) with Asthma   Continue on maintenance regimen using a Trelegy inhaler daily and has not experienced recent flare-ups or needed antibiotics.   Small Cell Lung Cancer   She underwent chemo-radiation in 2018 for small cell lung cancer -CT chest May 2024 did show an enlarging paraesophageal node from 6 mm to 10 mm.  Will set up for a CT chest follow-up.  Obstructive  Sleep Apnea   She has mild obstructive sleep apnea, evidenced by a home sleep study on July 21, 2022, showing an AHI of 7.0/hour and a lowest SpO2 of 80%. She is currently using an auto CPAP set between 6-20 cm H2O but is experiencing difficulty tolerating it due to high pressures and resultant epistaxis. She prefers oxygen therapy but understands the insurance constraints. We discussed lowering the CPAP pressure to 5-10 cm H2O and using saline gel for nasal dryness to improve tolerance.  General Health Maintenance   She is up to date on flu and pneumonia vaccinations. We will ensure she remains up to date with vaccinations.  Follow-up   We will follow up with Dr. Elgie Collard in 3 months.         Rubye Oaks, NP 12/13/2022

## 2022-12-17 ENCOUNTER — Ambulatory Visit: Payer: 59

## 2022-12-17 DIAGNOSIS — G4733 Obstructive sleep apnea (adult) (pediatric): Secondary | ICD-10-CM

## 2022-12-17 DIAGNOSIS — I6501 Occlusion and stenosis of right vertebral artery: Secondary | ICD-10-CM

## 2022-12-17 DIAGNOSIS — J449 Chronic obstructive pulmonary disease, unspecified: Secondary | ICD-10-CM

## 2022-12-17 DIAGNOSIS — C3491 Malignant neoplasm of unspecified part of right bronchus or lung: Secondary | ICD-10-CM

## 2022-12-17 DIAGNOSIS — R0789 Other chest pain: Secondary | ICD-10-CM

## 2022-12-17 DIAGNOSIS — I663 Occlusion and stenosis of cerebellar arteries: Secondary | ICD-10-CM

## 2022-12-17 DIAGNOSIS — I340 Nonrheumatic mitral (valve) insufficiency: Secondary | ICD-10-CM

## 2022-12-17 DIAGNOSIS — I2510 Atherosclerotic heart disease of native coronary artery without angina pectoris: Secondary | ICD-10-CM

## 2022-12-20 ENCOUNTER — Encounter: Payer: Self-pay | Admitting: Cardiovascular Disease

## 2022-12-20 ENCOUNTER — Ambulatory Visit (INDEPENDENT_AMBULATORY_CARE_PROVIDER_SITE_OTHER): Payer: 59 | Admitting: Cardiovascular Disease

## 2022-12-20 VITALS — BP 132/82 | HR 105 | Ht 59.0 in | Wt 187.2 lb

## 2022-12-20 DIAGNOSIS — I4711 Inappropriate sinus tachycardia, so stated: Secondary | ICD-10-CM

## 2022-12-20 DIAGNOSIS — R0602 Shortness of breath: Secondary | ICD-10-CM | POA: Diagnosis not present

## 2022-12-20 DIAGNOSIS — I2510 Atherosclerotic heart disease of native coronary artery without angina pectoris: Secondary | ICD-10-CM

## 2022-12-20 DIAGNOSIS — I6501 Occlusion and stenosis of right vertebral artery: Secondary | ICD-10-CM

## 2022-12-20 DIAGNOSIS — C3491 Malignant neoplasm of unspecified part of right bronchus or lung: Secondary | ICD-10-CM | POA: Diagnosis not present

## 2022-12-20 DIAGNOSIS — R0789 Other chest pain: Secondary | ICD-10-CM | POA: Diagnosis not present

## 2022-12-20 DIAGNOSIS — I663 Occlusion and stenosis of cerebellar arteries: Secondary | ICD-10-CM

## 2022-12-20 MED ORDER — METOPROLOL SUCCINATE ER 100 MG PO TB24: 100.0000 mg | ORAL_TABLET | Freq: Every day | ORAL | 11 refills | Status: DC

## 2022-12-20 MED ORDER — SPIRONOLACTONE 25 MG PO TABS: 12.5000 mg | ORAL_TABLET | Freq: Every day | ORAL | 11 refills | Status: DC

## 2022-12-20 NOTE — Progress Notes (Signed)
Cardiology Office Note   Date:  12/20/2022   ID:  Yvette Riley, DOB March 23, 1955, MRN [id number]  PCP:  Sherol Dade, DO  Cardiologist:  Adrian Blackwater, MD      History of Present Illness: Yvette Riley is a 67 y.o. female who presents for No chief complaint on file.   Doing well, but balance a issue due to CVA      Past Medical History:  Diagnosis Date   Allergy    Anemia    Anxiety    Arthritis    Asthma    Blood transfusion without reported diagnosis    C. difficile diarrhea 04/07/2016   CAP (community acquired pneumonia) 03/21/2015   Combined forms of age-related cataract of both eyes 05/06/2016   COPD (chronic obstructive pulmonary disease) (HCC)    CVA (cerebral vascular accident) (HCC) 06/17/2018   Diabetes mellitus without complication (HCC)    Displacement of lumbar intervertebral disc without myelopathy    Emphysema of lung (HCC)    GERD (gastroesophageal reflux disease)    Glaucoma    History of chicken pox    Hyperlipidemia    Hypertension    Pneumonia 03/29/2015   Sepsis (HCC) 03/17/2016   Shortness of breath    Small cell lung cancer [facility])      Past Surgical History:  Procedure Laterality Date   BALLOON DILATION  11/20/2022   Procedure: BALLOON DILATION;  Surgeon: Norma Fredrickson, Boykin Nearing, MD;  Location: [facility] ENDOSCOPY;  Service: Gastroenterology;;   BIOPSY  11/20/2022   Procedure: BIOPSY;  Surgeon: Norma Fredrickson, Boykin Nearing, MD;  Location: [facility] ENDOSCOPY;  Service: Gastroenterology;;   CESAREAN SECTION     ESOPHAGOGASTRODUODENOSCOPY N/A 05/17/2022   Procedure: ESOPHAGOGASTRODUODENOSCOPY (EGD);  Surgeon: [name], MD;  Location: [facility] ENDOSCOPY;  Service: Endoscopy;  Laterality: N/A;   ESOPHAGOGASTRODUODENOSCOPY N/A 07/10/2022   Procedure: ESOPHAGOGASTRODUODENOSCOPY (EGD);  Surgeon: Toledo, Boykin Nearing, MD;  Location: [facility] ENDOSCOPY;  Service: Gastroenterology;  Laterality: N/A;   ESOPHAGOGASTRODUODENOSCOPY (EGD) WITH PROPOFOL N/A 02/08/2022    Procedure: ESOPHAGOGASTRODUODENOSCOPY (EGD) WITH PROPOFOL;  Surgeon: Wyline Mood, MD;  Location: [facility] ENDOSCOPY;  Service: Gastroenterology;  Laterality: N/A;   ESOPHAGOGASTRODUODENOSCOPY (EGD) WITH PROPOFOL N/A 11/20/2022   Procedure: ESOPHAGOGASTRODUODENOSCOPY (EGD) WITH PROPOFOL;  Surgeon: Toledo, Boykin Nearing, MD;  Location: [facility] ENDOSCOPY;  Service: Gastroenterology;  Laterality: N/A;   TUBAL LIGATION     UPPER GI ENDOSCOPY       Current Outpatient Medications  Medication Sig Dispense Refill   metoprolol succinate (TOPROL XL) 100 MG 24 hr tablet Take 1 tablet (100 mg total) by mouth daily. Take with or immediately following a meal. 30 tablet 11   spironolactone (ALDACTONE) 25 MG tablet Take 0.5 tablets (12.5 mg total) by mouth daily. 30 tablet 11   albuterol (PROVENTIL) (2.5 MG/3ML) 0.083% nebulizer solution Take 2.5 mg by nebulization every 6 (six) hours.     busPIRone (BUSPAR) 5 MG tablet Take 5 mg by mouth 3 (three) times daily.     cholecalciferol (VITAMIN D3) 25 MCG (1000 UNIT) tablet Take 1,000 Units by mouth daily.     clonazePAM (KLONOPIN) 0.5 MG tablet TAKE ONE TABLET (0.5 mg total) BY MOUTH TWICE DAILY AS NEEDED (Patient not taking: Reported on 11/11/2022)     clopidogrel (PLAVIX) 75 MG tablet Take 1 tablet (75 mg total) by mouth daily. 90 tablet 4   diclofenac Sodium (VOLTAREN) 1 % GEL Apply 4 g topically 4 (four) times daily. (Patient taking differently: Apply 2 g topically every 4 (  four) hours as needed (pain). (Apply to lower back/back of neck)) 350 g 3   econazole nitrate 1 % cream Apply 1 Application topically 2 (two) times daily.     ezetimibe (ZETIA) 10 MG tablet Take 10 mg by mouth daily.     Fluticasone-Umeclidin-Vilant (TRELEGY ELLIPTA) 200-62.5-25 MCG/ACT AEPB Inhale 1 puff into the lungs daily. 30 each 12   gabapentin (NEURONTIN) 300 MG capsule Take 300 mg by mouth 2 (two) times daily.     hydrocortisone cream 1 % Apply 1 Application topically every 6 (six) hours as  needed for itching.     ketoconazole (NIZORAL) 2 % shampoo Apply 1 Application topically 2 (two) times a week.     levothyroxine (SYNTHROID) 50 MCG tablet Take 50 mcg by mouth daily before breakfast.     lidocaine 4 % Place 1 patch onto the skin daily. (Apply to neck and remove after 12 hours)     loperamide (IMODIUM) 2 MG capsule Take by mouth as needed for diarrhea or loose stools.     Magnesium 400 MG TABS Take 1 tablet by mouth 2 (two) times daily.     metFORMIN (GLUCOPHAGE) 500 MG tablet Take 500 mg by mouth 2 (two) times daily with a meal.     montelukast (SINGULAIR) 10 MG tablet Take 10 mg by mouth at bedtime. (Patient not taking: Reported on 12/13/2022)     nystatin (MYCOSTATIN/NYSTOP) powder Apply 1 Application topically every 12 (twelve) hours as needed (for fungal rash).     omeprazole (PRILOSEC) 40 MG capsule Take 1 capsule (40 mg total) by mouth 2 (two) times daily. Take in the morning and at dinnertime. (Patient taking differently: Take 40 mg by mouth in the morning and at bedtime.) 120 capsule 0   ondansetron (ZOFRAN) 4 MG tablet Take 4 mg by mouth every 6 (six) hours as needed for nausea.     oxyCODONE-acetaminophen (PERCOCET) 10-325 MG tablet TAKE ONE TABLET BY MOUTH EVERY 4 HOURS AS NEEDED (VIAL) (Patient taking differently: Take 1 tablet by mouth every 6 (six) hours as needed for pain.) 180 tablet 0   polyethylene glycol (MIRALAX / GLYCOLAX) 17 g packet Take 17 g by mouth daily as needed for mild constipation.     senna (SENOKOT) 8.6 MG TABS tablet Take 2 tablets by mouth daily.     sertraline (ZOLOFT) 50 MG tablet Take 1 tablet (50 mg total) by mouth daily. 90 tablet 4   vitamin B-12 (CYANOCOBALAMIN) 1000 MCG tablet Take 1 tablet (1,000 mcg total) by mouth daily. 30 tablet 0   zaleplon (SONATA) 5 MG capsule Take 5 mg by mouth at bedtime.     No current facility-administered medications for this visit.    Allergies:   Other, Penicillins, Yellow jacket venom [bee venom],  Codeine, Crestor [rosuvastatin], Gemfibrozil, Trazodone and nefazodone, and Lipitor [atorvastatin]    Social History:   reports that she quit smoking about 6 years ago. Her smoking use included cigarettes. She started smoking about 51 years ago. She has a 11.3 pack-year smoking history. She has never used smokeless tobacco. She reports that she does not drink alcohol and does not use drugs.   Family History:  family history includes COPD in her brother; Cancer in her maternal aunt; Heart disease in her brother and mother; Heart failure in her mother; Stroke in her mother.    ROS:     Review of Systems  Constitutional: Negative.   HENT: Negative.    Eyes: Negative.  Respiratory: Negative.    Gastrointestinal: Negative.   Genitourinary: Negative.   Musculoskeletal: Negative.   Skin: Negative.   Neurological: Negative.   Endo/Heme/Allergies: Negative.   Psychiatric/Behavioral: Negative.    All other systems reviewed and are negative.     All other systems are reviewed and negative.    PHYSICAL EXAM: VS:  BP 132/82   Pulse (!) 105   Wt 187 lb 3.2 oz (84.9 kg)   SpO2 96%   BMI 40.51 kg/m  , BMI Body mass index is 40.51 kg/m. Last weight:  Wt Readings from Last 3 Encounters:  12/20/22 187 lb 3.2 oz (84.9 kg)  12/13/22 191 lb (86.6 kg)  11/15/22 183 lb (83 kg)     Physical Exam Constitutional:      Appearance: Normal appearance.  Cardiovascular:     Rate and Rhythm: Normal rate and regular rhythm.     Heart sounds: Normal heart sounds.  Pulmonary:     Effort: Pulmonary effort is normal.     Breath sounds: Normal breath sounds.  Musculoskeletal:     Right lower leg: No edema.     Left lower leg: No edema.  Neurological:     Mental Status: She is alert.       EKG:   Recent Labs: 07/10/2022: ALT 14; BUN 15; Creatinine, Ser 0.76; Hemoglobin 13.0; Platelets 284; Potassium 4.4; Sodium 137    Lipid Panel    Component Value Date/Time   CHOL 228 (H) 06/19/2020  1405   TRIG 689 (HH) 06/19/2020 1405   HDL 35 (L) 06/19/2020 1405   CHOLHDL 6.5 (H) 06/19/2020 1405   CHOLHDL 4.2 06/17/2018 0514   VLDL 66 (H) 06/17/2018 0514   LDLCALC 82 06/19/2020 1405   LDLDIRECT 110 (H) 06/02/2018 1155      Other studies Reviewed: Additional studies/ records that were reviewed today include:  Review of the above records demonstrates:       No data to display            ASSESSMENT AND PLAN:    ICD-10-CM   1. Vertebral artery stenosis, right  I65.01 metoprolol succinate (TOPROL XL) 100 MG 24 hr tablet    spironolactone (ALDACTONE) 25 MG tablet    2. 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 Atherosclerosis of coronary artery of native heart without angina pectoris, unspecified vessel or lesion type  I25.10 metoprolol succinate (TOPROL XL) 100 MG 24 hr tablet    spironolactone (ALDACTONE) 25 MG tablet   stress test normal, echo had LVEF 54%, mild MR. Has diastolic dysfunction, add aldactone 12.5 daily    4. Small cell lung cancer, right (HCC)  C34.91 metoprolol succinate (TOPROL XL) 100 MG 24 hr tablet    spironolactone (ALDACTONE) 25 MG tablet    5. Inappropriate sinus tachycardia (HCC)  I47.11 metoprolol succinate (TOPROL XL) 100 MG 24 hr tablet    spironolactone (ALDACTONE) 25 MG tablet   Increase dosage of metoprolol.    6. Chest pain, non-cardiac  R07.89 metoprolol succinate (TOPROL XL) 100 MG 24 hr tablet    spironolactone (ALDACTONE) 25 MG tablet   Stress test had no ischaemia, but had Sinus tachycardia    7. SOB (shortness of breath)  R06.02 metoprolol succinate (TOPROL XL) 100 MG 24 hr tablet    spironolactone (ALDACTONE) 25 MG tablet   Wear cpap with use of saline nasal  drops for epistaxis       Problem List Items Addressed This Visit       Cardiovascular and Mediastinum   Coronary atherosclerosis   Relevant Medications    metoprolol succinate (TOPROL XL) 100 MG 24 hr tablet   spironolactone (ALDACTONE) 25 MG tablet   Embolism of superior cerebellar artery   Relevant Medications   metoprolol succinate (TOPROL XL) 100 MG 24 hr tablet   spironolactone (ALDACTONE) 25 MG tablet   Vertebral artery stenosis, right - Primary   Relevant Medications   metoprolol succinate (TOPROL XL) 100 MG 24 hr tablet   spironolactone (ALDACTONE) 25 MG tablet     Respiratory   Small cell lung cancer, right (HCC)   Relevant Medications   metoprolol succinate (TOPROL XL) 100 MG 24 hr tablet   spironolactone (ALDACTONE) 25 MG tablet   Other Visit Diagnoses     Inappropriate sinus tachycardia (HCC)       Increase dosage of metoprolol.   Relevant Medications   metoprolol succinate (TOPROL XL) 100 MG 24 hr tablet   spironolactone (ALDACTONE) 25 MG tablet   Chest pain, non-cardiac       Stress test had no ischaemia, but had Sinus tachycardia   Relevant Medications   metoprolol succinate (TOPROL XL) 100 MG 24 hr tablet   spironolactone (ALDACTONE) 25 MG tablet   SOB (shortness of breath)       Wear cpap with use of saline nasal drops for epistaxis   Relevant Medications   metoprolol succinate (TOPROL XL) 100 MG 24 hr tablet   spironolactone (ALDACTONE) 25 MG tablet          Disposition:   Return in about 4 weeks (around 01/17/2023).    Total time spent: 30 minutes  Signed,  Adrian Blackwater, MD  12/20/2022 11:05 AM    [facility]

## 2022-12-23 DIAGNOSIS — G8921 Chronic pain due to trauma: Secondary | ICD-10-CM | POA: Diagnosis not present

## 2022-12-25 ENCOUNTER — Ambulatory Visit: Payer: 59 | Admitting: Anesthesiology

## 2022-12-25 ENCOUNTER — Encounter
Admission: EM | Disposition: A | Discharge status: Home / Self Care | Payer: Self-pay | Source: Home / Self Care | Attending: Emergency Medicine

## 2022-12-25 ENCOUNTER — Other Ambulatory Visit: Payer: Self-pay

## 2022-12-25 ENCOUNTER — Emergency Department: Payer: 59

## 2022-12-25 ENCOUNTER — Ambulatory Visit
Admission: EM | Admit: 2022-12-25 | Discharge: 2022-12-25 | Disposition: A | Discharge status: Home / Self Care | Payer: 59 | Attending: Emergency Medicine | Admitting: Emergency Medicine

## 2022-12-25 DIAGNOSIS — Z87891 Personal history of nicotine dependence: Secondary | ICD-10-CM | POA: Insufficient documentation

## 2022-12-25 DIAGNOSIS — Z743 Need for continuous supervision: Secondary | ICD-10-CM | POA: Diagnosis not present

## 2022-12-25 DIAGNOSIS — Z6836 Body mass index (BMI) 36.0-36.9, adult: Secondary | ICD-10-CM | POA: Diagnosis not present

## 2022-12-25 DIAGNOSIS — I2510 Atherosclerotic heart disease of native coronary artery without angina pectoris: Secondary | ICD-10-CM | POA: Insufficient documentation

## 2022-12-25 DIAGNOSIS — R1310 Dysphagia, unspecified: Secondary | ICD-10-CM

## 2022-12-25 DIAGNOSIS — Z8673 Personal history of transient ischemic attack (TIA), and cerebral infarction without residual deficits: Secondary | ICD-10-CM | POA: Diagnosis not present

## 2022-12-25 DIAGNOSIS — R69 Illness, unspecified: Secondary | ICD-10-CM | POA: Diagnosis not present

## 2022-12-25 DIAGNOSIS — Z8249 Family history of ischemic heart disease and other diseases of the circulatory system: Secondary | ICD-10-CM | POA: Insufficient documentation

## 2022-12-25 DIAGNOSIS — E119 Type 2 diabetes mellitus without complications: Secondary | ICD-10-CM | POA: Insufficient documentation

## 2022-12-25 DIAGNOSIS — J4489 Other specified chronic obstructive pulmonary disease: Secondary | ICD-10-CM | POA: Diagnosis not present

## 2022-12-25 DIAGNOSIS — T18108A Unspecified foreign body in esophagus causing other injury, initial encounter: Secondary | ICD-10-CM | POA: Diagnosis not present

## 2022-12-25 DIAGNOSIS — Z03822 Encounter for observation for suspected aspirated (inhaled) foreign body ruled out: Secondary | ICD-10-CM | POA: Diagnosis not present

## 2022-12-25 DIAGNOSIS — W44F3XA Food entering into or through a natural orifice, initial encounter: Secondary | ICD-10-CM | POA: Diagnosis not present

## 2022-12-25 DIAGNOSIS — T18128A Food in esophagus causing other injury, initial encounter: Secondary | ICD-10-CM | POA: Diagnosis not present

## 2022-12-25 DIAGNOSIS — I10 Essential (primary) hypertension: Secondary | ICD-10-CM | POA: Insufficient documentation

## 2022-12-25 DIAGNOSIS — E66813 Obesity, class 3: Secondary | ICD-10-CM | POA: Diagnosis not present

## 2022-12-25 HISTORY — PX: ESOPHAGOGASTRODUODENOSCOPY (EGD) WITH PROPOFOL: SHX5813

## 2022-12-25 LAB — CBC
HCT: 36.9 % (ref 36.0–46.0)
Hemoglobin: 11.8 g/dL — ABNORMAL LOW (ref 12.0–15.0)
MCH: 27.1 pg (ref 26.0–34.0)
MCHC: 32 g/dL (ref 30.0–36.0)
MCV: 84.6 fL (ref 80.0–100.0)
Platelets: 214 10*3/uL (ref 150–400)
RBC: 4.36 MIL/uL (ref 3.87–5.11)
RDW: 13.7 % (ref 11.5–15.5)
WBC: 8.5 10*3/uL (ref 4.0–10.5)
nRBC: 0 % (ref 0.0–0.2)

## 2022-12-25 LAB — BASIC METABOLIC PANEL
Anion gap: 12 (ref 5–15)
BUN: 14 mg/dL (ref 8–23)
CO2: 25 mmol/L (ref 22–32)
Calcium: 9.1 mg/dL (ref 8.9–10.3)
Chloride: 99 mmol/L (ref 98–111)
Creatinine, Ser: 0.7 mg/dL (ref 0.44–1.00)
GFR, Estimated: >60 mL/min (ref >60)
Glucose, Bld: 151 mg/dL — ABNORMAL HIGH (ref 70–99)
Potassium: 4 mmol/L (ref 3.5–5.1)
Sodium: 136 mmol/L (ref 135–145)

## 2022-12-25 LAB — CBG MONITORING, ED: Glucose-Capillary: 150 mg/dL — ABNORMAL HIGH (ref 70–99)

## 2022-12-25 SURGERY — ESOPHAGOGASTRODUODENOSCOPY (EGD) WITH PROPOFOL
Anesthesia: General

## 2022-12-25 MED ORDER — FENTANYL CITRATE (PF) 100 MCG/2ML IJ SOLN
INTRAMUSCULAR | Status: DC | PRN
  Administered 2022-12-25: 50 ug via INTRAVENOUS

## 2022-12-25 MED ORDER — LIDOCAINE HCL (PF) 2 % IJ SOLN
INTRAMUSCULAR | Status: AC
  Filled 2022-12-25: qty 5

## 2022-12-25 MED ORDER — SODIUM CHLORIDE 0.9 % IV SOLN: INTRAVENOUS | Status: DC

## 2022-12-25 MED ORDER — PROPOFOL 10 MG/ML IV BOLUS
INTRAVENOUS | Status: AC
  Filled 2022-12-25: qty 20

## 2022-12-25 MED ORDER — FENTANYL CITRATE (PF) 100 MCG/2ML IJ SOLN
INTRAMUSCULAR | Status: AC
  Filled 2022-12-25: qty 2

## 2022-12-25 MED ORDER — PROPOFOL 10 MG/ML IV BOLUS
INTRAVENOUS | Status: DC | PRN
  Administered 2022-12-25: 150 ug/kg/min via INTRAVENOUS

## 2022-12-25 MED ORDER — MORPHINE SULFATE (PF) 2 MG/ML IV SOLN
2.0000 mg | Freq: Once | INTRAVENOUS | Status: AC
  Administered 2022-12-25: 2 mg via INTRAVENOUS
  Filled 2022-12-25: qty 1

## 2022-12-25 MED ORDER — ESMOLOL HCL 100 MG/10ML IV SOLN
INTRAVENOUS | Status: DC | PRN
  Administered 2022-12-25: 5 mg via INTRAVENOUS
  Administered 2022-12-25: 50 mg via INTRAVENOUS

## 2022-12-25 MED ORDER — IPRATROPIUM-ALBUTEROL 0.5-2.5 (3) MG/3ML IN SOLN
3.0000 mL | Freq: Once | RESPIRATORY_TRACT | Status: AC
  Administered 2022-12-25: 3 mL via RESPIRATORY_TRACT

## 2022-12-25 MED ORDER — SUCCINYLCHOLINE CHLORIDE 200 MG/10ML IV SOSY
PREFILLED_SYRINGE | INTRAVENOUS | Status: DC | PRN
  Administered 2022-12-25: 100 mg via INTRAVENOUS

## 2022-12-25 MED ORDER — DEXAMETHASONE SODIUM PHOSPHATE 10 MG/ML IJ SOLN
INTRAMUSCULAR | Status: AC
  Filled 2022-12-25: qty 1

## 2022-12-25 MED ORDER — GLUCAGON HCL RDNA (DIAGNOSTIC) 1 MG IJ SOLR
1.0000 mg | Freq: Once | INTRAMUSCULAR | Status: AC
  Administered 2022-12-25: 1 mg via INTRAVENOUS
  Filled 2022-12-25: qty 1

## 2022-12-25 MED ORDER — ONDANSETRON HCL 4 MG/2ML IJ SOLN
INTRAMUSCULAR | Status: AC
  Filled 2022-12-25: qty 2

## 2022-12-25 MED ORDER — ONDANSETRON HCL 4 MG/2ML IJ SOLN
INTRAMUSCULAR | Status: DC | PRN
  Administered 2022-12-25: 4 mg via INTRAVENOUS

## 2022-12-25 MED ORDER — LIDOCAINE HCL (CARDIAC) PF 100 MG/5ML IV SOSY
PREFILLED_SYRINGE | INTRAVENOUS | Status: DC | PRN
  Administered 2022-12-25: 100 mg via INTRAVENOUS

## 2022-12-25 MED ORDER — LACTATED RINGERS IV SOLN: INTRAVENOUS | Status: DC | PRN

## 2022-12-25 MED ORDER — DEXAMETHASONE SODIUM PHOSPHATE 10 MG/ML IJ SOLN
INTRAMUSCULAR | Status: DC | PRN
  Administered 2022-12-25: 10 mg via INTRAVENOUS

## 2022-12-25 MED ORDER — DEXMEDETOMIDINE HCL IN NACL 80 MCG/20ML IV SOLN
INTRAVENOUS | Status: DC | PRN
  Administered 2022-12-25: 8 ug via INTRAVENOUS

## 2022-12-25 MED ORDER — IPRATROPIUM-ALBUTEROL 0.5-2.5 (3) MG/3ML IN SOLN
RESPIRATORY_TRACT | Status: AC
  Filled 2022-12-25: qty 3

## 2022-12-25 NOTE — Transfer of Care (Signed)
Immediate Anesthesia Transfer of Care Note  Patient: Yvette Riley  Procedure(s) Performed: ESOPHAGOGASTRODUODENOSCOPY (EGD) WITH PROPOFOL  Patient Location: PACU  Anesthesia Type:General  Level of Consciousness: awake and drowsy  Airway & Oxygen Therapy: Patient Spontanous Breathing  Post-op Assessment: Report given to RN  Post vital signs: Reviewed and stable  Last Vitals:  Vitals Value Taken Time  BP 113/98 12/25/22 1453  Temp 35.7 C 12/25/22 1453  Pulse 115 12/25/22 1455  Resp 19 12/25/22 1455  SpO2 95 % 12/25/22 1455  Vitals shown include unfiled device data.  Last Pain:  Vitals:   12/25/22 1453  TempSrc: Temporal  PainSc: Asleep         Complications: There were no known notable events for this encounter.

## 2022-12-25 NOTE — ED Provider Notes (Signed)
[facility] Provider Note    Event Date/Time   First MD Initiated Contact with Patient 12/25/22 1026     (approximate)   History   Esophageal foreign body   HPI  Yvette Riley is a 68 y.o. female with a history of esophageal impaction and dilation presents with complaints of foreign body in the esophagus.  Patient reports she was taking her thyroid pill yesterday when she felt to get lodged in her esophagus.  She is not tolerating p.o.'s.     Physical Exam   Triage Vital Signs: ED Triage Vitals [12/25/22 0939]  Encounter Vitals Group     BP (!) 129/115     Systolic BP Percentile      Diastolic BP Percentile      Pulse Rate (!) 104     Resp 19     Temp 98.1 F (36.7 C)     Temp src      SpO2 95 %     Weight 81.6 kg (180 lb)     Height 1.499 m (4\' 11" )     Head Circumference      Peak Flow      Pain Score 4     Pain Loc      Pain Education      Exclude from Growth Chart     Most recent vital signs: Vitals:   12/25/22 0939 12/25/22 1350  BP: (!) 129/115 (!) 137/93  Pulse: (!) 104 (!) 103  Resp: 19 15  Temp: 98.1 F (36.7 C)   SpO2: 95% 97%     General: Awake, no distress.  CV:  Good peripheral perfusion.  Resp:  Normal effort.  Abd:  No distention.  Other:  No pharyngeal foreign body noted   ED Results / Procedures / Treatments   Labs (all labs ordered are listed, but only abnormal results are displayed) Labs Reviewed  CBC - Abnormal; Notable for the following components:      Result Value   Hemoglobin 11.8 (*)    All other components within normal limits  BASIC METABOLIC PANEL - Abnormal; Notable for the following components:   Glucose, Bld 151 (*)    All other components within normal limits  CBG MONITORING, ED - Abnormal; Notable for the following components:   Glucose-Capillary 150 (*)    All other components within normal limits     EKG     RADIOLOGY Chest x-ray viewed interpret by me, no acute  abnormality    PROCEDURES:  Critical Care performed:   Procedures   MEDICATIONS ORDERED IN ED: Medications  0.9 %  sodium chloride infusion ( Intravenous New Bag/Given 12/25/22 1228)  0.9 %  sodium chloride infusion (has no administration in time range)  glucagon (human recombinant) (GLUCAGEN) injection 1 mg (1 mg Intravenous Given 12/25/22 1120)  morphine (PF) 2 MG/ML injection 2 mg (2 mg Intravenous Given 12/25/22 1234)     IMPRESSION / MDM / ASSESSMENT AND PLAN / ED COURSE  I reviewed the triage vital signs and the nursing notes. Patient's presentation is most consistent with acute presentation with potential threat to life or bodily function.  Patient with likely esophageal foreign body versus pill esophagitis.  Not tolerating p.o.'s, history of esophageal impactions related to food.  IV glucagon given followed by [employer] with no improvement  Discussed with Dr. Allegra Lai of GI, she will take to endoscopy        FINAL CLINICAL IMPRESSION(S) / ED DIAGNOSES  Final diagnoses:  Esophageal foreign body, initial encounter     Rx / DC Orders   ED Discharge Orders     None        Note:  This document was prepared using Dragon voice recognition software and may include unintentional dictation errors.   Jene Every, MD 12/25/22 [phone]

## 2022-12-25 NOTE — ED Notes (Signed)
See triage notes. Patient c/o choking on her thyroid medication last night. Patient feels like it is stuck in the back of her throat.

## 2022-12-25 NOTE — ED Triage Notes (Signed)
PT arrives via POV from [facility]. Pt reports she got choked on her thyroid med last night. Pt reports she feels like it is stuck in the back of her throat. Pt is AxOx4. No apparent distress, airway patent, pt able to speak in complete sentences.

## 2022-12-25 NOTE — Op Note (Signed)
[facility] Gastroenterology Patient Name: Yvette Riley Procedure Date: 12/25/2022 2:22 PM MRN: [id number] Account #: [id number] Date of Birth: Feb 04, 1955 Admit Type: Outpatient Age: 67 Room: [facility] ENDO ROOM 4 Gender: Female Note Status: Finalized Instrument Name: Upper Endoscope [id number] Procedure:             Upper GI endoscopy Indications:           Foreign body in the esophagus Providers:             Toney Reil MD, MD Medicines:             General Anesthesia Complications:         No immediate complications. Estimated blood loss: None. Procedure:             Pre-Anesthesia Assessment:                        - Prior to the procedure, a History and Physical was                         performed, and patient medications and allergies were                         reviewed. The patient is competent. The risks and                         benefits of the procedure and the sedation options and                         risks were discussed with the patient. All questions                         were answered and informed consent was obtained.                         Patient identification and proposed procedure were                         verified by the physician, the nurse, the                         anesthesiologist, the anesthetist and the technician                         in the pre-procedure area in the procedure room in the                         endoscopy suite. Mental Status Examination: alert and                         oriented. Airway Examination: normal oropharyngeal                         airway and neck mobility. Respiratory Examination:                         clear to auscultation. CV Examination: normal.  Prophylactic Antibiotics: The patient does not require                         prophylactic antibiotics. Prior Anticoagulants: The                         patient has taken no anticoagulant or antiplatelet                          agents. ASA Grade Assessment: III - A patient with                         severe systemic disease. After reviewing the risks and                         benefits, the patient was deemed in satisfactory                         condition to undergo the procedure. The anesthesia                         plan was to use general anesthesia. Immediately prior                         to administration of medications, the patient was                         re-assessed for adequacy to receive sedatives. The                         heart rate, respiratory rate, oxygen saturations,                         blood pressure, adequacy of pulmonary ventilation, and                         response to care were monitored throughout the                         procedure. The physical status of the patient was                         re-assessed after the procedure.                        After obtaining informed consent, the endoscope was                         passed under direct vision. Throughout the procedure,                         the patient's blood pressure, pulse, and oxygen                         saturations were monitored continuously. The                         Endosonoscope was introduced through the mouth, and  advanced to the second part of duodenum. The upper GI                         endoscopy was accomplished without difficulty. The                         patient tolerated the procedure well. Findings:      Food was found in the mid esophagus. Removal was accomplished with a       Raptor grasping device. Estimated blood loss: none.      The entire examined stomach was normal.      The cardia and gastric fundus were normal on retroflexion.      The examined duodenum was normal.      The gastroesophageal junction and examined esophagus were normal. Impression:            - Food in the mid esophagus. Removal was successful.                         - Normal stomach.                        - Normal examined duodenum.                        - Normal gastroesophageal junction and esophagus. Recommendation:        - Discharge patient to home (with escort).                        - Chopped diet.                        - Wear dentures while eating and chew well                        - Food impaction episodes are secondary to pt not                         wearig dentures while eating Procedure Code(s):     --- Professional ---                        [phone], Esophagogastroduodenoscopy, flexible,                         transoral; with removal of foreign body(s) Diagnosis Code(s):     --- Professional ---                        U04.540J, Food in esophagus causing other injury,                         initial encounter                        T18.108A, Unspecified foreign body in esophagus                         causing other injury, initial encounter CPT copyright 2022 American Medical Association. All rights reserved. The codes documented in this report are preliminary and upon coder review may  be revised to meet current  compliance requirements. Dr. Libby Maw Toney Reil MD, MD 12/25/2022 2:46:51 PM This report has been signed electronically. Number of Addenda: 0 Note Initiated On: 12/25/2022 2:22 PM Estimated Blood Loss:  Estimated blood loss: none.      [facility]

## 2022-12-25 NOTE — Anesthesia Preprocedure Evaluation (Addendum)
Anesthesia Evaluation  Patient identified by MRN, date of birth, ID band Patient awake    Reviewed: Allergy & Precautions, NPO status , Patient's Chart, lab work & pertinent test results  Airway Mallampati: III  TM Distance: >3 FB Neck ROM: full    Dental  (+) Edentulous Upper, Edentulous Lower   Pulmonary neg pulmonary ROS, shortness of breath and with exertion, COPD,  COPD inhaler, former smoker   Pulmonary exam normal  + decreased breath sounds      Cardiovascular Exercise Tolerance: Good hypertension, Pt. on medications + CAD  negative cardio ROS Normal cardiovascular exam Rhythm:Regular Rate:Normal     Neuro/Psych   Anxiety     CVA, Residual Symptoms negative neurological ROS  negative psych ROS   GI/Hepatic negative GI ROS, Neg liver ROS,GERD  Medicated,,  Endo/Other  negative endocrine ROSdiabetes, Type 2, Oral Hypoglycemic Agents  Class 3 obesity  Renal/GU negative Renal ROS     Musculoskeletal   Abdominal  (+) + obese  Peds negative pediatric ROS (+)  Hematology negative hematology ROS (+) Blood dyscrasia, anemia   Anesthesia Other Findings Past Medical History: No date: Allergy No date: Anemia No date: Anxiety No date: Arthritis No date: Asthma No date: Blood transfusion without reported diagnosis 04/07/2016: C. difficile diarrhea 03/21/2015: CAP (community acquired pneumonia) 05/06/2016: Combined forms of age-related cataract of both eyes No date: COPD (chronic obstructive pulmonary disease) (HCC) 06/17/2018: CVA (cerebral vascular accident) (HCC) No date: Diabetes mellitus without complication (HCC) No date: Displacement of lumbar intervertebral disc without myelopathy No date: Emphysema of lung (HCC) No date: GERD (gastroesophageal reflux disease) No date: Glaucoma No date: History of chicken pox No date: Hyperlipidemia No date: Hypertension 03/29/2015: Pneumonia 03/17/2016: Sepsis (HCC) No date:  Shortness of breath No date: Small cell lung cancer [facility])  Past Surgical History: 11/20/2022: BALLOON DILATION     Comment:  Procedure: BALLOON DILATION;  Surgeon: Norma Fredrickson, Boykin Nearing, MD;  Location: [facility] ENDOSCOPY;  Service:               Gastroenterology;; 11/20/2022: BIOPSY     Comment:  Procedure: BIOPSY;  Surgeon: Toledo, Boykin Nearing, MD;                Location: [facility] ENDOSCOPY;  Service: Gastroenterology;; No date: CESAREAN SECTION 05/17/2022: ESOPHAGOGASTRODUODENOSCOPY; N/A     Comment:  Procedure: ESOPHAGOGASTRODUODENOSCOPY (EGD);  Surgeon:               [name], MD;  Location: [facility] ENDOSCOPY;  Service:               Endoscopy;  Laterality: N/A; 07/10/2022: ESOPHAGOGASTRODUODENOSCOPY; N/A     Comment:  Procedure: ESOPHAGOGASTRODUODENOSCOPY (EGD);  Surgeon:               Toledo, Boykin Nearing, MD;  Location: [facility] ENDOSCOPY;                Service: Gastroenterology;  Laterality: N/A; 02/08/2022: ESOPHAGOGASTRODUODENOSCOPY (EGD) WITH PROPOFOL; N/A     Comment:  Procedure: ESOPHAGOGASTRODUODENOSCOPY (EGD) WITH               PROPOFOL;  Surgeon: Wyline Mood, MD;  Location: [facility]               ENDOSCOPY;  Service: Gastroenterology;  Laterality: N/A; 11/20/2022: ESOPHAGOGASTRODUODENOSCOPY (EGD) WITH PROPOFOL; N/A     Comment:  Procedure: ESOPHAGOGASTRODUODENOSCOPY (EGD) WITH  PROPOFOL;  Surgeon: Toledo, Boykin Nearing, MD;  Location:               [facility] ENDOSCOPY;  Service: Gastroenterology;  Laterality:               N/A; No date: TUBAL LIGATION No date: UPPER GI ENDOSCOPY  BMI    Body Mass Index: 36.33 kg/m      Reproductive/Obstetrics negative OB ROS                             Anesthesia Physical Anesthesia Plan  ASA: 3 and emergent  Anesthesia Plan: General   Post-op Pain Management:    Induction: Intravenous  PONV Risk Score and Plan: Ondansetron, Dexamethasone, Midazolam and Treatment may vary due to age or medical  condition  Airway Management Planned: Oral ETT  Additional Equipment:   Intra-op Plan:   Post-operative Plan: Extubation in OR  Informed Consent: I have reviewed the patients History and Physical, chart, labs and discussed the procedure including the risks, benefits and alternatives for the proposed anesthesia with the patient or authorized representative who has indicated his/her understanding and acceptance.     Dental Advisory Given  Plan Discussed with: CRNA and Surgeon  Anesthesia Plan Comments:        Anesthesia Quick Evaluation

## 2022-12-25 NOTE — Consult Note (Signed)
Yvette Repress, MD [address]  [address]  [place], [place] [address]  Main: [phone]  Fax: [phone] Pager: [phone]   Consultation  Referring Provider:     No ref. provider found Primary Care Physician:  Sherol Dade, DO Primary Gastroenterologist: Dr Norma Fredrickson      Reason for Consultation: Pill stuck in esophagus  Date of Admission:  12/25/2022 Date of Consultation:  12/25/2022         HPI:   Yvette Riley is a 67 y.o. female known history of esophageal ulcer, food impaction in the esophagus, x 2 in 2024, most recently in 06/2022 when she presented with food bolus, underwent upper endoscopy with removal of the bolus.  Subsequently, underwent upper endoscopy by Dr. Norma Fredrickson on 11/20/2022, was dilated with [address], esophageal biopsies were performed, with no evidence of eosinophilic esophagitis. She now presents with pill stuck in her esophagus last night.  She said, she could not eat her dinner.  She was taking pills and felt it was stuck.  She is from [facility], not sure if she is taking Plavix.  She is not sure if she is taking proton pump inhibitor daily.  She was spitting up saliva when I saw her in the ER.  She was given [employer] as well as glucagon in the ER without any success.  NSAIDs: None  Antiplts/Anticoagulants/Anti thrombotics: Plavix  Upper endoscopy 11/20/2022 FINAL DIAGNOSIS        1. Esophagus, biopsy, Proximal, cbx :       REACTIVE SQUAMOUS MUCOSA       NEGATIVE FOR GLANDULAR EPITHELIUM, EOSINOPHILS, DYSPLASIA AND CARCINOMA        2. Esophagus, biopsy, Distal, cbx :       REACTIVE SQUAMOUS MUCOSA       NEGATIVE FOR GLANDULAR EPITHELIUM, EOSINOPHILS, DYSPLASIA AND CARCINOMA        3. Stomach- Body, cbx :       ANTRAL-TYPE GASTRIC MUCOSA SHOWING CHRONIC GASTRITIS WITH LYMPHOID AGGREGATE       HELICOBACTER STAIN NEGATIVE (IHC, ADEQUATE CONTROL)       NEGATIVE FOR INTESTINAL METAPLASIA, DYSPLASIA AND CARCINOMA  Past  Medical History:  Diagnosis Date   Allergy    Anemia    Anxiety    Arthritis    Asthma    Blood transfusion without reported diagnosis    C. difficile diarrhea 04/07/2016   CAP (community acquired pneumonia) 03/21/2015   Combined forms of age-related cataract of both eyes 05/06/2016   COPD (chronic obstructive pulmonary disease) (HCC)    CVA (cerebral vascular accident) (HCC) 06/17/2018   Diabetes mellitus without complication (HCC)    Displacement of lumbar intervertebral disc without myelopathy    Emphysema of lung (HCC)    GERD (gastroesophageal reflux disease)    Glaucoma    History of chicken pox    Hyperlipidemia    Hypertension    Pneumonia 03/29/2015   Sepsis (HCC) 03/17/2016   Shortness of breath    Small cell lung cancer (HCC)     Past Surgical History:  Procedure Laterality Date   BALLOON DILATION  11/20/2022   Procedure: BALLOON DILATION;  Surgeon: Norma Fredrickson, Boykin Nearing, MD;  Location: [facility] ENDOSCOPY;  Service: Gastroenterology;;   BIOPSY  11/20/2022   Procedure: BIOPSY;  Surgeon: Norma Fredrickson, Boykin Nearing, MD;  Location: [facility] ENDOSCOPY;  Service: Gastroenterology;;   CESAREAN SECTION     ESOPHAGOGASTRODUODENOSCOPY N/A 05/17/2022   Procedure: ESOPHAGOGASTRODUODENOSCOPY (EGD);  Surgeon: [name], MD;  Location: [facility] ENDOSCOPY;  Service: Endoscopy;  Laterality: N/A;   ESOPHAGOGASTRODUODENOSCOPY N/A 07/10/2022   Procedure: ESOPHAGOGASTRODUODENOSCOPY (EGD);  Surgeon: Toledo, Boykin Nearing, MD;  Location: [facility] ENDOSCOPY;  Service: Gastroenterology;  Laterality: N/A;   ESOPHAGOGASTRODUODENOSCOPY (EGD) WITH PROPOFOL N/A 02/08/2022   Procedure: ESOPHAGOGASTRODUODENOSCOPY (EGD) WITH PROPOFOL;  Surgeon: Wyline Mood, MD;  Location: [facility] ENDOSCOPY;  Service: Gastroenterology;  Laterality: N/A;   ESOPHAGOGASTRODUODENOSCOPY (EGD) WITH PROPOFOL N/A 11/20/2022   Procedure: ESOPHAGOGASTRODUODENOSCOPY (EGD) WITH PROPOFOL;  Surgeon: Toledo, Boykin Nearing, MD;  Location: [facility] ENDOSCOPY;  Service:  Gastroenterology;  Laterality: N/A;   TUBAL LIGATION     UPPER GI ENDOSCOPY       Current Facility-Administered Medications:    0.9 %  sodium chloride infusion, , Intravenous, Continuous, Ayeza Therriault, Loel Dubonnet, MD, Last Rate: 20 mL/hr at 12/25/22 1228, New Bag at 12/25/22 1228   0.9 %  sodium chloride infusion, , Intravenous, Continuous, Arina Torry, Loel Dubonnet, MD    Family History  Problem Relation Age of Onset   Heart failure Mother    Heart disease Mother    Stroke Mother    Heart disease Brother    COPD Brother    Cancer Maternal Aunt        Breast Cancer     Social History   Tobacco Use   Smoking status: Former    Current packs/day: 0.00    Average packs/day: 0.3 packs/day for 45.0 years (11.3 ttl pk-yrs)    Types: Cigarettes    Start date: 03/18/1971    Quit date: 03/17/2016    Years since quitting: 6.7   Smokeless tobacco: Never   Tobacco comments:    pt states she quit in 2016-2017  Vaping Use   Vaping status: Never Used  Substance Use Topics   Alcohol use: No    Alcohol/week: 0.0 standard drinks of alcohol   Drug use: No    Allergies as of 12/25/2022 - Review Complete 12/25/2022  Allergen Reaction Noted   Other Anaphylaxis 04/06/2016   Penicillins Anaphylaxis, Rash, and Other (See Comments) 03/28/2014   Yellow jacket venom [bee venom] Anaphylaxis 03/28/2014   Codeine Hives 03/28/2014   Crestor [rosuvastatin] Nausea And Vomiting 03/28/2014   Gemfibrozil Rash, Nausea And Vomiting, and Swelling 03/28/2014   Trazodone and nefazodone Nausea And Vomiting 10/10/2015   Lipitor [atorvastatin] Nausea Only 03/28/2014    Review of Systems:    All systems reviewed and negative except where noted in HPI.   Physical Exam:  Vital signs in last 24 hours: Temp:  [98.1 F (36.7 C)] 98.1 F (36.7 C) (11/27 0939) Pulse Rate:  [103-104] 103 (11/27 1350) Resp:  [15-19] 15 (11/27 1350) BP: (129-137)/(93-115) 137/93 (11/27 1350) SpO2:  [95 %-97 %] 97 % (11/27  1350) Weight:  [81.6 kg] 81.6 kg (11/27 1350)   General:   Pleasant, cooperative in NAD Head:  Normocephalic and atraumatic. Eyes:   No icterus.   Conjunctiva pink. PERRLA. Ears:  Normal auditory acuity. Neck:  Supple; no masses or thyroidomegaly Lungs: Respirations even and unlabored. Lungs clear to auscultation bilaterally.   No wheezes, crackles, or rhonchi.  Heart:  Regular rate and rhythm;  Without murmur, clicks, rubs or gallops Abdomen:  Soft, nondistended, nontender. Normal bowel sounds. No appreciable masses or hepatomegaly.  No rebound or guarding.  Rectal:  Not performed. Msk:  Symmetrical without gross deformities.  Strength normal Extremities:  Without edema, cyanosis or clubbing. Neurologic:  Alert and oriented x3;  grossly normal neurologically. Skin:  Intact without significant lesions or  rashes. Psych:  Alert and cooperative. Normal affect.  LAB RESULTS:    Latest Ref Rng & Units 12/25/2022   11:03 AM 07/10/2022    4:45 PM 02/08/2022    4:00 AM  CBC  WBC 4.0 - 10.5 K/uL 8.5  9.0  8.7   Hemoglobin 12.0 - 15.0 g/dL 10.2  72.5  36.6   Hematocrit 36.0 - 46.0 % 36.9  40.9  38.6   Platelets 150 - 400 K/uL 214  284  196     BMET    Latest Ref Rng & Units 12/25/2022   11:03 AM 07/10/2022    4:45 PM 06/20/2022   11:35 AM  BMP  Glucose 70 - 99 mg/dL 440  347    BUN 8 - 23 mg/dL 14  15    Creatinine 4.25 - 1.00 mg/dL 9.56  3.87  5.64   Sodium 135 - 145 mmol/L 136  137    Potassium 3.5 - 5.1 mmol/L 4.0  4.4    Chloride 98 - 111 mmol/L 99  98    CO2 22 - 32 mmol/L 25  25    Calcium 8.9 - 10.3 mg/dL 9.1  9.5      LFT    Latest Ref Rng & Units 07/10/2022    4:45 PM 02/08/2022    4:00 AM 02/07/2022    1:34 PM  Hepatic Function  Total Protein 6.5 - 8.1 g/dL 8.5  7.3  8.3   Albumin 3.5 - 5.0 g/dL 5.0  4.2  5.0   AST 15 - 41 U/L 29  15  23    ALT 0 - 44 U/L 14  11  12    Alk Phosphatase 38 - 126 U/L 83  61  69   Total Bilirubin 0.3 - 1.2 mg/dL 1.0  1.4  1.9       STUDIES: DG Chest 2 View  Result Date: 12/25/2022 CLINICAL DATA:  Possible aspiration. EXAM: CHEST - 2 VIEW COMPARISON:  CT chest dated Jun 20, 2022. Chest x-ray dated February 07, 2022. FINDINGS: Stable cardiomediastinal silhouette with normal heart size. Similar post treatment changes with scarring in the parahilar right lung. No focal consolidation, pleural effusion, or pneumothorax. No acute osseous abnormality. Chronic T12 and L1 compression deformities again noted. IMPRESSION: 1. No acute cardiopulmonary disease. Electronically Signed   By: Obie Dredge M.D.   On: 12/25/2022 11:15      Impression / Plan:   TANEQUA SCHERSCHEL is a 67 y.o. female with history of CVA, COPD, hypertension, hyperlipidemia, tobacco use, esophageal dysphagia with food impaction, no evidence of significant esophagitis presents with pill stuck in the esophagus  Discussed with patient regarding upper endoscopy for further evaluation Recommend long-term PPI Protonix 40 mg p.o. twice daily before meals Close follow-up with Dr. Norma Fredrickson as outpatient Patient is wearing dentures, denies having any hard meats.  Advised her to chew thoroughly while eating  I have discussed alternative options, risks & benefits,  which include, but are not limited to, bleeding, infection, perforation,respiratory complication & drug reaction.  The patient agrees with this plan & written consent will be obtained.     Thank you for involving me in the care of this patient.      LOS: 0 days   Lannette Donath, MD  12/25/2022, 1:59 PM    Note: This dictation was prepared with Dragon dictation along with smaller phrase technology. Any transcriptional errors that result from this process are unintentional.

## 2022-12-25 NOTE — Anesthesia Procedure Notes (Signed)
Procedure Name: Intubation Date/Time: 12/25/2022 2:29 PM  Performed by: Lysbeth Penner, CRNAPre-anesthesia Checklist: Patient identified, Emergency Drugs available, Suction available and Patient being monitored Patient Re-evaluated:Patient Re-evaluated prior to induction Oxygen Delivery Method: Circle system utilized Preoxygenation: Pre-oxygenation with 100% oxygen Induction Type: IV induction Ventilation: Mask ventilation without difficulty Laryngoscope Size: McGrath and 3 Grade View: Grade I Tube type: Oral Number of attempts: 1 Airway Equipment and Method: Stylet and Oral airway Placement Confirmation: ETT inserted through vocal cords under direct vision, positive ETCO2 and breath sounds checked- equal and bilateral Secured at: 20 cm Tube secured with: Tape Dental Injury: Teeth and Oropharynx as per pre-operative assessment

## 2022-12-30 ENCOUNTER — Encounter: Payer: Self-pay | Admitting: Gastroenterology

## 2022-12-30 ENCOUNTER — Ambulatory Visit
Admission: RE | Admit: 2022-12-30 | Discharge: 2022-12-30 | Disposition: A | Discharge status: Home / Self Care | Payer: 59 | Source: Ambulatory Visit | Attending: Family Medicine | Admitting: Family Medicine

## 2022-12-30 DIAGNOSIS — C3491 Malignant neoplasm of unspecified part of right bronchus or lung: Secondary | ICD-10-CM | POA: Diagnosis not present

## 2022-12-30 DIAGNOSIS — I700 Atherosclerosis of aorta: Secondary | ICD-10-CM | POA: Diagnosis not present

## 2022-12-30 DIAGNOSIS — K2210 Ulcer of esophagus without bleeding: Secondary | ICD-10-CM | POA: Diagnosis not present

## 2022-12-31 DIAGNOSIS — G4733 Obstructive sleep apnea (adult) (pediatric): Secondary | ICD-10-CM | POA: Diagnosis not present

## 2023-01-02 DIAGNOSIS — R1310 Dysphagia, unspecified: Secondary | ICD-10-CM | POA: Diagnosis not present

## 2023-01-03 NOTE — Anesthesia Postprocedure Evaluation (Signed)
Anesthesia Post Note  Patient: Yvette Riley  Procedure(s) Performed: ESOPHAGOGASTRODUODENOSCOPY (EGD) WITH PROPOFOL  Patient location during evaluation: PACU Anesthesia Type: General Level of consciousness: awake Pain management: pain level controlled Vital Signs Assessment: post-procedure vital signs reviewed and stable Respiratory status: spontaneous breathing and patient connected to nasal cannula oxygen Cardiovascular status: blood pressure returned to baseline Anesthetic complications: no   There were no known notable events for this encounter.   Last Vitals:  Vitals:   12/25/22 1523 12/25/22 1532  BP: 123/78 123/78  Pulse:    Resp:    Temp:    SpO2: 94% 94%    Last Pain:  Vitals:   12/25/22 1453  TempSrc: Temporal                 VAN STAVEREN,Audreyanna Butkiewicz

## 2023-01-04 ENCOUNTER — Emergency Department
Admission: EM | Admit: 2023-01-04 | Discharge: 2023-01-04 | Disposition: A | Discharge status: Home / Self Care | Payer: 59 | Attending: Emergency Medicine | Admitting: Emergency Medicine

## 2023-01-04 ENCOUNTER — Encounter: Payer: Self-pay | Admitting: Pharmacy Technician

## 2023-01-04 ENCOUNTER — Emergency Department: Payer: 59

## 2023-01-04 ENCOUNTER — Other Ambulatory Visit: Payer: Self-pay

## 2023-01-04 DIAGNOSIS — J439 Emphysema, unspecified: Secondary | ICD-10-CM | POA: Diagnosis not present

## 2023-01-04 DIAGNOSIS — E119 Type 2 diabetes mellitus without complications: Secondary | ICD-10-CM | POA: Insufficient documentation

## 2023-01-04 DIAGNOSIS — H409 Unspecified glaucoma: Secondary | ICD-10-CM | POA: Diagnosis not present

## 2023-01-04 DIAGNOSIS — L03113 Cellulitis of right upper limb: Secondary | ICD-10-CM | POA: Insufficient documentation

## 2023-01-04 DIAGNOSIS — I214 Non-ST elevation (NSTEMI) myocardial infarction: Secondary | ICD-10-CM | POA: Diagnosis not present

## 2023-01-04 DIAGNOSIS — E538 Deficiency of other specified B group vitamins: Secondary | ICD-10-CM | POA: Diagnosis not present

## 2023-01-04 DIAGNOSIS — Z66 Do not resuscitate: Secondary | ICD-10-CM | POA: Diagnosis not present

## 2023-01-04 DIAGNOSIS — Z85118 Personal history of other malignant neoplasm of bronchus and lung: Secondary | ICD-10-CM | POA: Diagnosis not present

## 2023-01-04 DIAGNOSIS — E871 Hypo-osmolality and hyponatremia: Secondary | ICD-10-CM | POA: Diagnosis not present

## 2023-01-04 DIAGNOSIS — R0689 Other abnormalities of breathing: Secondary | ICD-10-CM | POA: Diagnosis not present

## 2023-01-04 DIAGNOSIS — E039 Hypothyroidism, unspecified: Secondary | ICD-10-CM | POA: Diagnosis not present

## 2023-01-04 DIAGNOSIS — R059 Cough, unspecified: Secondary | ICD-10-CM | POA: Diagnosis not present

## 2023-01-04 DIAGNOSIS — W19XXXA Unspecified fall, initial encounter: Secondary | ICD-10-CM | POA: Diagnosis not present

## 2023-01-04 DIAGNOSIS — I2510 Atherosclerotic heart disease of native coronary artery without angina pectoris: Secondary | ICD-10-CM | POA: Diagnosis not present

## 2023-01-04 DIAGNOSIS — M545 Low back pain, unspecified: Secondary | ICD-10-CM | POA: Insufficient documentation

## 2023-01-04 DIAGNOSIS — Z20822 Contact with and (suspected) exposure to covid-19: Secondary | ICD-10-CM | POA: Insufficient documentation

## 2023-01-04 DIAGNOSIS — E785 Hyperlipidemia, unspecified: Secondary | ICD-10-CM | POA: Diagnosis not present

## 2023-01-04 DIAGNOSIS — R9089 Other abnormal findings on diagnostic imaging of central nervous system: Secondary | ICD-10-CM | POA: Diagnosis not present

## 2023-01-04 DIAGNOSIS — I700 Atherosclerosis of aorta: Secondary | ICD-10-CM | POA: Diagnosis not present

## 2023-01-04 DIAGNOSIS — I10 Essential (primary) hypertension: Secondary | ICD-10-CM | POA: Diagnosis not present

## 2023-01-04 DIAGNOSIS — M160 Bilateral primary osteoarthritis of hip: Secondary | ICD-10-CM | POA: Diagnosis not present

## 2023-01-04 DIAGNOSIS — R21 Rash and other nonspecific skin eruption: Secondary | ICD-10-CM | POA: Diagnosis not present

## 2023-01-04 DIAGNOSIS — M25552 Pain in left hip: Secondary | ICD-10-CM | POA: Diagnosis not present

## 2023-01-04 DIAGNOSIS — S0003XA Contusion of scalp, initial encounter: Secondary | ICD-10-CM | POA: Diagnosis not present

## 2023-01-04 DIAGNOSIS — I6782 Cerebral ischemia: Secondary | ICD-10-CM | POA: Diagnosis not present

## 2023-01-04 DIAGNOSIS — Z1152 Encounter for screening for COVID-19: Secondary | ICD-10-CM | POA: Diagnosis not present

## 2023-01-04 DIAGNOSIS — J449 Chronic obstructive pulmonary disease, unspecified: Secondary | ICD-10-CM | POA: Insufficient documentation

## 2023-01-04 DIAGNOSIS — I4510 Unspecified right bundle-branch block: Secondary | ICD-10-CM | POA: Diagnosis not present

## 2023-01-04 DIAGNOSIS — F0390 Unspecified dementia without behavioral disturbance: Secondary | ICD-10-CM | POA: Insufficient documentation

## 2023-01-04 DIAGNOSIS — R1110 Vomiting, unspecified: Secondary | ICD-10-CM | POA: Diagnosis not present

## 2023-01-04 DIAGNOSIS — G8921 Chronic pain due to trauma: Secondary | ICD-10-CM | POA: Diagnosis not present

## 2023-01-04 DIAGNOSIS — R197 Diarrhea, unspecified: Secondary | ICD-10-CM | POA: Insufficient documentation

## 2023-01-04 DIAGNOSIS — L0390 Cellulitis, unspecified: Secondary | ICD-10-CM | POA: Diagnosis not present

## 2023-01-04 DIAGNOSIS — M19021 Primary osteoarthritis, right elbow: Secondary | ICD-10-CM | POA: Diagnosis not present

## 2023-01-04 DIAGNOSIS — G8929 Other chronic pain: Secondary | ICD-10-CM | POA: Insufficient documentation

## 2023-01-04 DIAGNOSIS — W19XXXD Unspecified fall, subsequent encounter: Secondary | ICD-10-CM | POA: Diagnosis not present

## 2023-01-04 DIAGNOSIS — Z7984 Long term (current) use of oral hypoglycemic drugs: Secondary | ICD-10-CM | POA: Diagnosis not present

## 2023-01-04 DIAGNOSIS — R112 Nausea with vomiting, unspecified: Secondary | ICD-10-CM | POA: Insufficient documentation

## 2023-01-04 DIAGNOSIS — R55 Syncope and collapse: Secondary | ICD-10-CM | POA: Diagnosis not present

## 2023-01-04 DIAGNOSIS — S0990XA Unspecified injury of head, initial encounter: Secondary | ICD-10-CM | POA: Diagnosis not present

## 2023-01-04 DIAGNOSIS — M79631 Pain in right forearm: Secondary | ICD-10-CM | POA: Diagnosis not present

## 2023-01-04 DIAGNOSIS — Z8249 Family history of ischemic heart disease and other diseases of the circulatory system: Secondary | ICD-10-CM | POA: Diagnosis not present

## 2023-01-04 DIAGNOSIS — Z743 Need for continuous supervision: Secondary | ICD-10-CM | POA: Diagnosis not present

## 2023-01-04 DIAGNOSIS — I878 Other specified disorders of veins: Secondary | ICD-10-CM | POA: Diagnosis not present

## 2023-01-04 DIAGNOSIS — E559 Vitamin D deficiency, unspecified: Secondary | ICD-10-CM | POA: Diagnosis not present

## 2023-01-04 DIAGNOSIS — J4489 Other specified chronic obstructive pulmonary disease: Secondary | ICD-10-CM | POA: Diagnosis not present

## 2023-01-04 DIAGNOSIS — R6889 Other general symptoms and signs: Secondary | ICD-10-CM | POA: Diagnosis not present

## 2023-01-04 DIAGNOSIS — R296 Repeated falls: Secondary | ICD-10-CM | POA: Diagnosis not present

## 2023-01-04 DIAGNOSIS — I499 Cardiac arrhythmia, unspecified: Secondary | ICD-10-CM | POA: Diagnosis not present

## 2023-01-04 DIAGNOSIS — G9389 Other specified disorders of brain: Secondary | ICD-10-CM | POA: Diagnosis not present

## 2023-01-04 DIAGNOSIS — K838 Other specified diseases of biliary tract: Secondary | ICD-10-CM | POA: Diagnosis not present

## 2023-01-04 DIAGNOSIS — R0902 Hypoxemia: Secondary | ICD-10-CM | POA: Diagnosis not present

## 2023-01-04 DIAGNOSIS — G629 Polyneuropathy, unspecified: Secondary | ICD-10-CM | POA: Diagnosis not present

## 2023-01-04 DIAGNOSIS — E11610 Type 2 diabetes mellitus with diabetic neuropathic arthropathy: Secondary | ICD-10-CM | POA: Diagnosis not present

## 2023-01-04 DIAGNOSIS — R1011 Right upper quadrant pain: Secondary | ICD-10-CM | POA: Diagnosis not present

## 2023-01-04 DIAGNOSIS — I69351 Hemiplegia and hemiparesis following cerebral infarction affecting right dominant side: Secondary | ICD-10-CM | POA: Diagnosis not present

## 2023-01-04 DIAGNOSIS — D649 Anemia, unspecified: Secondary | ICD-10-CM | POA: Diagnosis not present

## 2023-01-04 DIAGNOSIS — Z136 Encounter for screening for cardiovascular disorders: Secondary | ICD-10-CM | POA: Diagnosis not present

## 2023-01-04 DIAGNOSIS — M8580 Other specified disorders of bone density and structure, unspecified site: Secondary | ICD-10-CM | POA: Diagnosis not present

## 2023-01-04 DIAGNOSIS — M6281 Muscle weakness (generalized): Secondary | ICD-10-CM | POA: Diagnosis not present

## 2023-01-04 DIAGNOSIS — C3491 Malignant neoplasm of unspecified part of right bronchus or lung: Secondary | ICD-10-CM | POA: Diagnosis not present

## 2023-01-04 DIAGNOSIS — R29818 Other symptoms and signs involving the nervous system: Secondary | ICD-10-CM | POA: Diagnosis not present

## 2023-01-04 DIAGNOSIS — S199XXA Unspecified injury of neck, initial encounter: Secondary | ICD-10-CM | POA: Diagnosis not present

## 2023-01-04 DIAGNOSIS — Z7401 Bed confinement status: Secondary | ICD-10-CM | POA: Diagnosis not present

## 2023-01-04 LAB — LIPASE, BLOOD: Lipase: 22 U/L (ref 11–51)

## 2023-01-04 LAB — COMPREHENSIVE METABOLIC PANEL
ALT: 19 U/L (ref 0–44)
AST: 31 U/L (ref 15–41)
Albumin: 4.4 g/dL (ref 3.5–5.0)
Alkaline Phosphatase: 78 U/L (ref 38–126)
Anion gap: 13 (ref 5–15)
BUN: 12 mg/dL (ref 8–23)
CO2: 24 mmol/L (ref 22–32)
Calcium: 9 mg/dL (ref 8.9–10.3)
Chloride: 98 mmol/L (ref 98–111)
Creatinine, Ser: 0.76 mg/dL (ref 0.44–1.00)
GFR, Estimated: >60 mL/min (ref >60)
Glucose, Bld: 189 mg/dL — ABNORMAL HIGH (ref 70–99)
Potassium: 3.9 mmol/L (ref 3.5–5.1)
Sodium: 135 mmol/L (ref 135–145)
Total Bilirubin: 1.4 mg/dL — ABNORMAL HIGH (ref <1.2)
Total Protein: 7.9 g/dL (ref 6.5–8.1)

## 2023-01-04 LAB — URINALYSIS, ROUTINE W REFLEX MICROSCOPIC
Bilirubin Urine: NEGATIVE
Glucose, UA: NEGATIVE mg/dL
Hgb urine dipstick: NEGATIVE
Ketones, ur: 5 mg/dL — AB
Leukocytes,Ua: NEGATIVE
Nitrite: NEGATIVE
Protein, ur: NEGATIVE mg/dL
Specific Gravity, Urine: 1.017 (ref 1.005–1.030)
pH: 7 (ref 5.0–8.0)

## 2023-01-04 LAB — CBC
HCT: 39.3 % (ref 36.0–46.0)
Hemoglobin: 12.5 g/dL (ref 12.0–15.0)
MCH: 26.9 pg (ref 26.0–34.0)
MCHC: 31.8 g/dL (ref 30.0–36.0)
MCV: 84.5 fL (ref 80.0–100.0)
Platelets: 202 10*3/uL (ref 150–400)
RBC: 4.65 MIL/uL (ref 3.87–5.11)
RDW: 14.2 % (ref 11.5–15.5)
WBC: 14.4 10*3/uL — ABNORMAL HIGH (ref 4.0–10.5)
nRBC: 0 % (ref 0.0–0.2)

## 2023-01-04 LAB — SARS CORONAVIRUS 2 BY RT PCR: SARS Coronavirus 2 by RT PCR: NEGATIVE

## 2023-01-04 MED ORDER — SULFAMETHOXAZOLE-TRIMETHOPRIM 800-160 MG PO TABS
1.0000 | ORAL_TABLET | Freq: Once | ORAL | Status: AC
  Administered 2023-01-04: 1 via ORAL
  Filled 2023-01-04: qty 1

## 2023-01-04 MED ORDER — SODIUM CHLORIDE 0.9 % IV BOLUS
1000.0000 mL | Freq: Once | INTRAVENOUS | Status: AC
  Administered 2023-01-04: 1000 mL via INTRAVENOUS

## 2023-01-04 MED ORDER — GABAPENTIN 300 MG PO CAPS
300.0000 mg | ORAL_CAPSULE | ORAL | Status: AC
  Administered 2023-01-04: 300 mg via ORAL
  Filled 2023-01-04: qty 1

## 2023-01-04 MED ORDER — SULFAMETHOXAZOLE-TRIMETHOPRIM 800-160 MG PO TABS: 1.0000 | ORAL_TABLET | Freq: Two times a day (BID) | ORAL | 0 refills | Status: DC

## 2023-01-04 MED ORDER — ONDANSETRON HCL 4 MG/2ML IJ SOLN
4.0000 mg | Freq: Once | INTRAMUSCULAR | Status: AC
  Administered 2023-01-04: 4 mg via INTRAVENOUS
  Filled 2023-01-04: qty 2

## 2023-01-04 MED ORDER — OXYCODONE-ACETAMINOPHEN 5-325 MG PO TABS
2.0000 | ORAL_TABLET | Freq: Once | ORAL | Status: AC
Start: 2023-01-04 — End: 2023-01-04
  Administered 2023-01-04: 2 via ORAL
  Filled 2023-01-04: qty 2

## 2023-01-04 NOTE — ED Notes (Signed)
Patient transported to CT 

## 2023-01-04 NOTE — ED Provider Notes (Signed)
[facility] Provider Note    Event Date/Time   First MD Initiated Contact with Patient 01/04/23 1901     (approximate)   History   Chief Complaint: Emesis   HPI  Yvette Riley is a 67 y.o. female with a history of COPD, stroke, dementia, diabetes who is brought to the ED today due to nausea vomiting diarrhea that started this morning.  At facility she was given Phenergan for the symptoms, and subsequently seemed confused.  Sister at bedside also reports that patient has had multiple falls recently but without any serious injuries.  Patient does complain of chronic back pain as well as some pain in her right forearm.  Sister notes that patient has had MRSA infection in the right forearm in the past.          Physical Exam   Triage Vital Signs: ED Triage Vitals  Encounter Vitals Group     BP 01/04/23 1630 124/71     Systolic BP Percentile --      Diastolic BP Percentile --      Pulse Rate 01/04/23 1630 81     Resp 01/04/23 1630 16     Temp 01/04/23 1630 98.7 F (37.1 C)     Temp src --      SpO2 01/04/23 1630 93 %     Weight --      Height --      Head Circumference --      Peak Flow --      Pain Score 01/04/23 1624 6     Pain Loc --      Pain Education --      Exclude from Growth Chart --     Most recent vital signs: Vitals:   01/04/23 1630 01/04/23 2020  BP: 124/71 (!) 145/87  Pulse: 81 (!) 125  Resp: 16 20  Temp: 98.7 F (37.1 C) 98.2 F (36.8 C)  SpO2: 93% 94%    General: Awake, no distress.  CV:  Good peripheral perfusion.  Regular rate and rhythm Resp:  Normal effort.  Clear to auscultation bilaterally Abd:  No distention.  Mild right upper quadrant tenderness. Other:  Full range of motion in extremities.  Long bones stable.  There is induration erythema warmth tenderness over a well-demarcated area of the right forearm without fluctuance or open wounds.  No purulent drainage.  No crepitus.  No streaking   ED Results  / Procedures / Treatments   Labs (all labs ordered are listed, but only abnormal results are displayed) Labs Reviewed  COMPREHENSIVE METABOLIC PANEL - Abnormal; Notable for the following components:      Result Value   Glucose, Bld 189 (*)    Total Bilirubin 1.4 (*)    All other components within normal limits  CBC - Abnormal; Notable for the following components:   WBC 14.4 (*)    All other components within normal limits  URINALYSIS, ROUTINE W REFLEX MICROSCOPIC - Abnormal; Notable for the following components:   Color, Urine YELLOW (*)    APPearance CLEAR (*)    Ketones, ur 5 (*)    All other components within normal limits  SARS CORONAVIRUS 2 BY RT PCR  LIPASE, BLOOD     EKG    RADIOLOGY Chest x-ray interpreted by me, unremarkable.  Radiology report reviewed.  Ultrasound abdomen right upper quadrant unremarkable  CT head and cervical spine negative.   PROCEDURES:  Procedures   MEDICATIONS ORDERED IN ED: Medications  oxyCODONE-acetaminophen (PERCOCET/ROXICET) 5-325 MG per tablet 2 tablet (has no administration in time range)  gabapentin (NEURONTIN) capsule 300 mg (has no administration in time range)  sulfamethoxazole-trimethoprim (BACTRIM DS) 800-160 MG per tablet 1 tablet (has no administration in time range)  sodium chloride 0.9 % bolus 1,000 mL (1,000 mLs Intravenous New Bag/Given 01/04/23 2040)  ondansetron (ZOFRAN) injection 4 mg (4 mg Intravenous Given 01/04/23 2040)     IMPRESSION / MDM / ASSESSMENT AND PLAN / ED COURSE  I reviewed the triage vital signs and the nursing notes.  DDx: Dehydration, AKI, electrolyte abnormality, UTI, forearm fracture, forearm cellulitis, intracranial hemorrhage, C-spine fracture, cholecystitis, COVID, pancreatitis, viral gastroenteritis  Patient's presentation is most consistent with acute presentation with potential threat to life or bodily function.  Patient brought to the ED due to confusion after being given Phenergan  for viral gastroenteritis symptoms.  Currently her mental status is appropriate and patient does seem to be at her baseline.  However, she has clinically apparent cellulitis of the right upper extremity.  With her falls will obtain an x-ray to ensure there is not an underlying fracture.  Will start on Bactrim due to cellulitis MRSA history.  With right upper quadrant abdominal tenderness, ultrasound obtained which is unremarkable.  CT head and cervical spine for trauma evaluation negative.  Discussed with patient and her sister at bedside, both agreeable to patient being discharged back to her care facility with prescription for Bactrim.  Will give her evening dose of Percocet and gabapentin now.       FINAL CLINICAL IMPRESSION(S) / ED DIAGNOSES   Final diagnoses:  Right arm cellulitis  Vomiting and diarrhea  Type 2 diabetes mellitus without complication, without long-term current use of insulin (HCC)  Chronic low back pain, unspecified back pain laterality, unspecified whether sciatica present     Rx / DC Orders   ED Discharge Orders          Ordered    sulfamethoxazole-trimethoprim (BACTRIM DS) 800-160 MG tablet  2 times daily        01/04/23 2123             Note:  This document was prepared using Dragon voice recognition software and may include unintentional dictation errors.   Sharman Cheek, MD 01/04/23 2128

## 2023-01-04 NOTE — ED Notes (Signed)
Pt denies nausea at this time.

## 2023-01-04 NOTE — ED Triage Notes (Addendum)
Pt bib ems from [facility] around 0600 today pt with NVD, denies fever. Given phenergan for nausea at facility with relief. Facility also reported to ems that pt was altered, however ems reports pt A&OX4. Pt has had several falls today without injury (not on anticoags). Pt complains of back and leg pain. Red rash noted to pt R forearm. HR 124 94% RA ET 35 144/80 99.109F

## 2023-01-04 NOTE — ED Notes (Signed)
Notified EDP Stafford of HR of 124.  Pt is on Metoprolol at facility.

## 2023-01-04 NOTE — ED Notes (Signed)
This RN was assisting pt to bed. Her family advised me she was in a nursing home and doesn't walk. She is only able to stand and pivot. This RN was not aware of same. Pt placed in bed with assistance of another RN. Pt is caox4 and answers all questions at this time.

## 2023-01-06 DIAGNOSIS — R112 Nausea with vomiting, unspecified: Secondary | ICD-10-CM | POA: Diagnosis not present

## 2023-01-06 DIAGNOSIS — L0390 Cellulitis, unspecified: Secondary | ICD-10-CM | POA: Diagnosis not present

## 2023-01-06 DIAGNOSIS — E559 Vitamin D deficiency, unspecified: Secondary | ICD-10-CM | POA: Diagnosis not present

## 2023-01-07 ENCOUNTER — Inpatient Hospital Stay: Payer: 59

## 2023-01-07 ENCOUNTER — Emergency Department: Payer: 59

## 2023-01-07 ENCOUNTER — Encounter: Payer: Self-pay | Admitting: Emergency Medicine

## 2023-01-07 ENCOUNTER — Other Ambulatory Visit: Payer: Self-pay

## 2023-01-07 ENCOUNTER — Inpatient Hospital Stay
Admission: EM | Admit: 2023-01-07 | Discharge: 2023-01-14 | DRG: 322 | Disposition: A | Discharge status: Skilled Nursing Facility | Payer: 59 | Source: Skilled Nursing Facility | Attending: Internal Medicine | Admitting: Internal Medicine

## 2023-01-07 DIAGNOSIS — Z8249 Family history of ischemic heart disease and other diseases of the circulatory system: Secondary | ICD-10-CM

## 2023-01-07 DIAGNOSIS — Z9221 Personal history of antineoplastic chemotherapy: Secondary | ICD-10-CM

## 2023-01-07 DIAGNOSIS — E871 Hypo-osmolality and hyponatremia: Secondary | ICD-10-CM | POA: Diagnosis present

## 2023-01-07 DIAGNOSIS — F0390 Unspecified dementia without behavioral disturbance: Secondary | ICD-10-CM | POA: Diagnosis present

## 2023-01-07 DIAGNOSIS — Z66 Do not resuscitate: Secondary | ICD-10-CM | POA: Diagnosis present

## 2023-01-07 DIAGNOSIS — G8929 Other chronic pain: Secondary | ICD-10-CM | POA: Diagnosis present

## 2023-01-07 DIAGNOSIS — R112 Nausea with vomiting, unspecified: Secondary | ICD-10-CM | POA: Diagnosis not present

## 2023-01-07 DIAGNOSIS — Z7984 Long term (current) use of oral hypoglycemic drugs: Secondary | ICD-10-CM | POA: Diagnosis not present

## 2023-01-07 DIAGNOSIS — I214 Non-ST elevation (NSTEMI) myocardial infarction: Secondary | ICD-10-CM | POA: Diagnosis not present

## 2023-01-07 DIAGNOSIS — I10 Essential (primary) hypertension: Secondary | ICD-10-CM | POA: Diagnosis present

## 2023-01-07 DIAGNOSIS — Z743 Need for continuous supervision: Secondary | ICD-10-CM | POA: Diagnosis not present

## 2023-01-07 DIAGNOSIS — R55 Syncope and collapse: Secondary | ICD-10-CM | POA: Diagnosis present

## 2023-01-07 DIAGNOSIS — J439 Emphysema, unspecified: Secondary | ICD-10-CM | POA: Diagnosis present

## 2023-01-07 DIAGNOSIS — G9389 Other specified disorders of brain: Secondary | ICD-10-CM | POA: Diagnosis not present

## 2023-01-07 DIAGNOSIS — E66812 Obesity, class 2: Secondary | ICD-10-CM | POA: Diagnosis present

## 2023-01-07 DIAGNOSIS — W19XXXD Unspecified fall, subsequent encounter: Secondary | ICD-10-CM

## 2023-01-07 DIAGNOSIS — Z79890 Hormone replacement therapy (postmenopausal): Secondary | ICD-10-CM

## 2023-01-07 DIAGNOSIS — H409 Unspecified glaucoma: Secondary | ICD-10-CM | POA: Diagnosis present

## 2023-01-07 DIAGNOSIS — E039 Hypothyroidism, unspecified: Secondary | ICD-10-CM | POA: Diagnosis not present

## 2023-01-07 DIAGNOSIS — Z79899 Other long term (current) drug therapy: Secondary | ICD-10-CM

## 2023-01-07 DIAGNOSIS — G629 Polyneuropathy, unspecified: Secondary | ICD-10-CM | POA: Diagnosis not present

## 2023-01-07 DIAGNOSIS — I69351 Hemiplegia and hemiparesis following cerebral infarction affecting right dominant side: Secondary | ICD-10-CM

## 2023-01-07 DIAGNOSIS — R197 Diarrhea, unspecified: Secondary | ICD-10-CM | POA: Diagnosis present

## 2023-01-07 DIAGNOSIS — J449 Chronic obstructive pulmonary disease, unspecified: Secondary | ICD-10-CM | POA: Diagnosis not present

## 2023-01-07 DIAGNOSIS — Z7902 Long term (current) use of antithrombotics/antiplatelets: Secondary | ICD-10-CM

## 2023-01-07 DIAGNOSIS — E538 Deficiency of other specified B group vitamins: Secondary | ICD-10-CM | POA: Diagnosis not present

## 2023-01-07 DIAGNOSIS — D649 Anemia, unspecified: Secondary | ICD-10-CM | POA: Diagnosis present

## 2023-01-07 DIAGNOSIS — I2510 Atherosclerotic heart disease of native coronary artery without angina pectoris: Secondary | ICD-10-CM | POA: Diagnosis present

## 2023-01-07 DIAGNOSIS — Z85118 Personal history of other malignant neoplasm of bronchus and lung: Secondary | ICD-10-CM

## 2023-01-07 DIAGNOSIS — F32A Depression, unspecified: Secondary | ICD-10-CM | POA: Diagnosis present

## 2023-01-07 DIAGNOSIS — R296 Repeated falls: Secondary | ICD-10-CM | POA: Diagnosis present

## 2023-01-07 DIAGNOSIS — Z555 Less than a high school diploma: Secondary | ICD-10-CM

## 2023-01-07 DIAGNOSIS — E119 Type 2 diabetes mellitus without complications: Secondary | ICD-10-CM | POA: Diagnosis present

## 2023-01-07 DIAGNOSIS — E669 Obesity, unspecified: Secondary | ICD-10-CM | POA: Diagnosis present

## 2023-01-07 DIAGNOSIS — Z1152 Encounter for screening for COVID-19: Secondary | ICD-10-CM

## 2023-01-07 DIAGNOSIS — Z6837 Body mass index (BMI) 37.0-37.9, adult: Secondary | ICD-10-CM

## 2023-01-07 DIAGNOSIS — M25552 Pain in left hip: Secondary | ICD-10-CM | POA: Diagnosis not present

## 2023-01-07 DIAGNOSIS — I878 Other specified disorders of veins: Secondary | ICD-10-CM | POA: Diagnosis not present

## 2023-01-07 DIAGNOSIS — C3491 Malignant neoplasm of unspecified part of right bronchus or lung: Secondary | ICD-10-CM | POA: Diagnosis present

## 2023-01-07 DIAGNOSIS — L03113 Cellulitis of right upper limb: Secondary | ICD-10-CM | POA: Diagnosis present

## 2023-01-07 DIAGNOSIS — A084 Viral intestinal infection, unspecified: Secondary | ICD-10-CM | POA: Diagnosis present

## 2023-01-07 DIAGNOSIS — I700 Atherosclerosis of aorta: Secondary | ICD-10-CM | POA: Diagnosis not present

## 2023-01-07 DIAGNOSIS — W19XXXA Unspecified fall, initial encounter: Secondary | ICD-10-CM | POA: Diagnosis present

## 2023-01-07 DIAGNOSIS — Z803 Family history of malignant neoplasm of breast: Secondary | ICD-10-CM

## 2023-01-07 DIAGNOSIS — M8580 Other specified disorders of bone density and structure, unspecified site: Secondary | ICD-10-CM | POA: Diagnosis not present

## 2023-01-07 DIAGNOSIS — Z823 Family history of stroke: Secondary | ICD-10-CM

## 2023-01-07 DIAGNOSIS — Z880 Allergy status to penicillin: Secondary | ICD-10-CM

## 2023-01-07 DIAGNOSIS — J4489 Other specified chronic obstructive pulmonary disease: Secondary | ICD-10-CM | POA: Diagnosis present

## 2023-01-07 DIAGNOSIS — R29818 Other symptoms and signs involving the nervous system: Secondary | ICD-10-CM | POA: Diagnosis not present

## 2023-01-07 DIAGNOSIS — I4510 Unspecified right bundle-branch block: Secondary | ICD-10-CM | POA: Diagnosis present

## 2023-01-07 DIAGNOSIS — G8921 Chronic pain due to trauma: Secondary | ICD-10-CM | POA: Diagnosis not present

## 2023-01-07 DIAGNOSIS — Z923 Personal history of irradiation: Secondary | ICD-10-CM

## 2023-01-07 DIAGNOSIS — S0990XA Unspecified injury of head, initial encounter: Secondary | ICD-10-CM | POA: Diagnosis not present

## 2023-01-07 DIAGNOSIS — Z885 Allergy status to narcotic agent status: Secondary | ICD-10-CM

## 2023-01-07 DIAGNOSIS — Z825 Family history of asthma and other chronic lower respiratory diseases: Secondary | ICD-10-CM

## 2023-01-07 DIAGNOSIS — R0902 Hypoxemia: Secondary | ICD-10-CM | POA: Diagnosis not present

## 2023-01-07 DIAGNOSIS — R9089 Other abnormal findings on diagnostic imaging of central nervous system: Secondary | ICD-10-CM | POA: Diagnosis not present

## 2023-01-07 DIAGNOSIS — S0003XA Contusion of scalp, initial encounter: Secondary | ICD-10-CM | POA: Diagnosis not present

## 2023-01-07 DIAGNOSIS — M160 Bilateral primary osteoarthritis of hip: Secondary | ICD-10-CM | POA: Diagnosis not present

## 2023-01-07 DIAGNOSIS — F419 Anxiety disorder, unspecified: Secondary | ICD-10-CM | POA: Diagnosis present

## 2023-01-07 DIAGNOSIS — Z87891 Personal history of nicotine dependence: Secondary | ICD-10-CM

## 2023-01-07 DIAGNOSIS — E785 Hyperlipidemia, unspecified: Secondary | ICD-10-CM | POA: Diagnosis not present

## 2023-01-07 DIAGNOSIS — Z888 Allergy status to other drugs, medicaments and biological substances status: Secondary | ICD-10-CM

## 2023-01-07 DIAGNOSIS — M545 Low back pain, unspecified: Secondary | ICD-10-CM | POA: Diagnosis present

## 2023-01-07 DIAGNOSIS — Z8614 Personal history of Methicillin resistant Staphylococcus aureus infection: Secondary | ICD-10-CM

## 2023-01-07 DIAGNOSIS — K219 Gastro-esophageal reflux disease without esophagitis: Secondary | ICD-10-CM | POA: Diagnosis present

## 2023-01-07 HISTORY — DX: Non-ST elevation (NSTEMI) myocardial infarction: I21.4

## 2023-01-07 LAB — CK: Total CK: 163 U/L (ref 38–234)

## 2023-01-07 LAB — BASIC METABOLIC PANEL
Anion gap: 13 (ref 5–15)
BUN: 14 mg/dL (ref 8–23)
CO2: 21 mmol/L — ABNORMAL LOW (ref 22–32)
Calcium: 8.6 mg/dL — ABNORMAL LOW (ref 8.9–10.3)
Chloride: 100 mmol/L (ref 98–111)
Creatinine, Ser: 0.92 mg/dL (ref 0.44–1.00)
GFR, Estimated: >60 mL/min (ref >60)
Glucose, Bld: 137 mg/dL — ABNORMAL HIGH (ref 70–99)
Potassium: 4 mmol/L (ref 3.5–5.1)
Sodium: 134 mmol/L — ABNORMAL LOW (ref 135–145)

## 2023-01-07 LAB — CBC
HCT: 34.8 % — ABNORMAL LOW (ref 36.0–46.0)
Hemoglobin: 11.1 g/dL — ABNORMAL LOW (ref 12.0–15.0)
MCH: 27.2 pg (ref 26.0–34.0)
MCHC: 31.9 g/dL (ref 30.0–36.0)
MCV: 85.3 fL (ref 80.0–100.0)
Platelets: 227 10*3/uL (ref 150–400)
RBC: 4.08 MIL/uL (ref 3.87–5.11)
RDW: 14.4 % (ref 11.5–15.5)
WBC: 7.7 10*3/uL (ref 4.0–10.5)
nRBC: 0 % (ref 0.0–0.2)

## 2023-01-07 LAB — HEPATIC FUNCTION PANEL
ALT: 16 U/L (ref 0–44)
AST: 29 U/L (ref 15–41)
Albumin: 3.9 g/dL (ref 3.5–5.0)
Alkaline Phosphatase: 80 U/L (ref 38–126)
Bilirubin, Direct: 0.1 mg/dL (ref 0.0–0.2)
Indirect Bilirubin: 0.5 mg/dL (ref 0.3–0.9)
Total Bilirubin: 0.6 mg/dL (ref <1.2)
Total Protein: 7.3 g/dL (ref 6.5–8.1)

## 2023-01-07 LAB — URINALYSIS, ROUTINE W REFLEX MICROSCOPIC
Bacteria, UA: NONE SEEN
Bilirubin Urine: NEGATIVE
Glucose, UA: NEGATIVE mg/dL
Hgb urine dipstick: NEGATIVE
Ketones, ur: NEGATIVE mg/dL
Nitrite: NEGATIVE
Protein, ur: NEGATIVE mg/dL
Specific Gravity, Urine: 1.026 (ref 1.005–1.030)
pH: 5 (ref 5.0–8.0)

## 2023-01-07 LAB — TROPONIN I (HIGH SENSITIVITY)
Troponin I (High Sensitivity): 184 ng/L (ref <18)
Troponin I (High Sensitivity): 385 ng/L (ref <18)

## 2023-01-07 MED ORDER — IOHEXOL 350 MG/ML SOLN
75.0000 mL | Freq: Once | INTRAVENOUS | Status: AC | PRN
  Administered 2023-01-07: 75 mL via INTRAVENOUS

## 2023-01-07 MED ORDER — CLOPIDOGREL BISULFATE 75 MG PO TABS
75.0000 mg | ORAL_TABLET | Freq: Every day | ORAL | Status: DC
  Administered 2023-01-08: 75 mg via ORAL
  Filled 2023-01-07: qty 1

## 2023-01-07 MED ORDER — BUSPIRONE HCL 10 MG PO TABS
5.0000 mg | ORAL_TABLET | Freq: Three times a day (TID) | ORAL | Status: DC
  Administered 2023-01-08 – 2023-01-14 (×18): 5 mg via ORAL
  Filled 2023-01-07 (×18): qty 1

## 2023-01-07 MED ORDER — ONDANSETRON HCL 4 MG/2ML IJ SOLN
4.0000 mg | Freq: Four times a day (QID) | INTRAMUSCULAR | Status: DC | PRN
  Administered 2023-01-11 – 2023-01-12 (×3): 4 mg via INTRAVENOUS
  Filled 2023-01-07 (×3): qty 2

## 2023-01-07 MED ORDER — UMECLIDINIUM BROMIDE 62.5 MCG/ACT IN AEPB
1.0000 | INHALATION_SPRAY | Freq: Every day | RESPIRATORY_TRACT | Status: DC
  Administered 2023-01-08: 1 via RESPIRATORY_TRACT
  Filled 2023-01-07 (×2): qty 7

## 2023-01-07 MED ORDER — SERTRALINE HCL 50 MG PO TABS
50.0000 mg | ORAL_TABLET | Freq: Every day | ORAL | Status: DC
  Administered 2023-01-08 – 2023-01-14 (×7): 50 mg via ORAL
  Filled 2023-01-07 (×7): qty 1

## 2023-01-07 MED ORDER — ASPIRIN 81 MG PO TBEC
81.0000 mg | DELAYED_RELEASE_TABLET | Freq: Every day | ORAL | Status: DC
  Administered 2023-01-08 – 2023-01-14 (×7): 81 mg via ORAL
  Filled 2023-01-07 (×7): qty 1

## 2023-01-07 MED ORDER — ALBUTEROL SULFATE (2.5 MG/3ML) 0.083% IN NEBU
2.5000 mg | INHALATION_SOLUTION | Freq: Four times a day (QID) | RESPIRATORY_TRACT | Status: DC
  Administered 2023-01-08 – 2023-01-10 (×7): 2.5 mg via RESPIRATORY_TRACT
  Filled 2023-01-07 (×9): qty 3

## 2023-01-07 MED ORDER — ACETAMINOPHEN 325 MG PO TABS: 650.0000 mg | ORAL_TABLET | ORAL | Status: DC | PRN

## 2023-01-07 MED ORDER — HEPARIN (PORCINE) 25000 UT/250ML-% IV SOLN
1200.0000 [IU]/h | INTRAVENOUS | Status: DC
Start: 2023-01-07 — End: 2023-01-08
  Administered 2023-01-07: 750 [IU]/h via INTRAVENOUS
  Filled 2023-01-07 (×2): qty 250

## 2023-01-07 MED ORDER — METOPROLOL SUCCINATE ER 50 MG PO TB24
50.0000 mg | ORAL_TABLET | Freq: Every day | ORAL | Status: DC
  Administered 2023-01-08 – 2023-01-14 (×7): 50 mg via ORAL
  Filled 2023-01-07 (×7): qty 1

## 2023-01-07 MED ORDER — LEVOTHYROXINE SODIUM 50 MCG PO TABS
50.0000 ug | ORAL_TABLET | Freq: Every day | ORAL | Status: DC
  Administered 2023-01-08 – 2023-01-14 (×6): 50 ug via ORAL
  Filled 2023-01-07 (×7): qty 1

## 2023-01-07 MED ORDER — SODIUM CHLORIDE 0.9 % IV SOLN: 250.0000 mL | INTRAVENOUS | Status: AC | PRN

## 2023-01-07 MED ORDER — OXYCODONE-ACETAMINOPHEN 5-325 MG PO TABS
2.0000 | ORAL_TABLET | Freq: Once | ORAL | Status: AC
Start: 2023-01-07 — End: 2023-01-07
  Administered 2023-01-07: 2 via ORAL
  Filled 2023-01-07: qty 2

## 2023-01-07 MED ORDER — FLUTICASONE FUROATE-VILANTEROL 200-25 MCG/ACT IN AEPB
1.0000 | INHALATION_SPRAY | Freq: Every day | RESPIRATORY_TRACT | Status: DC
  Administered 2023-01-08: 1 via RESPIRATORY_TRACT
  Filled 2023-01-07 (×2): qty 28

## 2023-01-07 MED ORDER — INSULIN ASPART 100 UNIT/ML IJ SOLN: 0.0000 [IU] | INTRAMUSCULAR | Status: DC | PRN

## 2023-01-07 MED ORDER — SODIUM CHLORIDE 0.9% FLUSH: 3.0000 mL | INTRAVENOUS | Status: DC | PRN

## 2023-01-07 MED ORDER — SODIUM CHLORIDE 0.9% FLUSH
3.0000 mL | Freq: Two times a day (BID) | INTRAVENOUS | Status: DC
  Administered 2023-01-08 – 2023-01-14 (×8): 3 mL via INTRAVENOUS

## 2023-01-07 MED ORDER — NITROGLYCERIN 0.4 MG SL SUBL: 0.4000 mg | SUBLINGUAL_TABLET | SUBLINGUAL | Status: DC | PRN

## 2023-01-07 MED ORDER — HEPARIN BOLUS VIA INFUSION
3600.0000 [IU] | Freq: Once | INTRAVENOUS | Status: AC
  Administered 2023-01-07: 3600 [IU] via INTRAVENOUS
  Filled 2023-01-07: qty 3600

## 2023-01-07 NOTE — ED Provider Triage Note (Signed)
Emergency Medicine Provider Triage Evaluation Note  Yvette Riley , a 67 y.o. female  was evaluated in triage.  Pt complains of multiple falls over the past few days. Patient has left hip pain from her most recent fall.   Review of Systems  Positive: Left hip pain Negative:   Physical Exam  BP 106/71 (BP Location: Left Arm)   Pulse 99   Temp 98.2 F (36.8 C) (Oral)   Resp 18   Ht 4\' 10"  (1.473 m)   Wt 81.6 kg   SpO2 96%   BMI 37.62 kg/m  Gen:   Awake, no distress   Resp:  Normal effort  MSK:   Moves extremities without difficulty  Other:    Medical Decision Making  Medically screening exam initiated at 1:57 PM.  Appropriate orders placed.  Yvette Riley was informed that the remainder of the evaluation will be completed by another provider, this initial triage assessment does not replace that evaluation, and the importance of remaining in the ED until their evaluation is complete.     Cameron Ali, PA-C 01/07/23 1359

## 2023-01-07 NOTE — H&P (Signed)
History and Physical    Patient: Yvette Riley NWG:[id number] DOB: [birth year] DOA: 01/07/2023 DOS: the patient was seen and examined on 01/08/2023 PCP: Sherol Dade, DO  Patient coming from: [facility]  Chief Complaint:  Chief Complaint  Patient presents with   Fall    HPI: Yvette Riley is a 67 y.o. female with medical history significant for frequent falls, allergy to penicillin, yellowjacket venom, codeine, Crestor, gemfibrozil, trazodone, Lipitor CVA with residual right-sided weakness on aspirin/Plavix, CAD, HLD, DM2, HTN, GERD, small cell lung cancer in remission s /p radiation/chemotherapy, asthma/COPD, anxiety/depression, chronic pain on long term opiates,  coming to [place] for fall.  Chart review shows patient had an upper endoscopy on December 25, 2022 showing:- Food in the mid esophagus.  Chart review shows patient had an upper endoscopy in November 2024 showing- Normal stomach. - Normal examined duodenum. - Normal gastroesophageal junction and esophagus.   In emergency room vitals trend shows: Vitals:   01/07/23 1930 01/07/23 2000 01/07/23 2030 01/07/23 2359  BP: 132/75 (!) 147/84 112/80 117/74  Pulse: 89 98 98 95  Temp:      Resp: 15 13 14 11   Height:      Weight:      SpO2: 96% 100% 96% 95%  TempSrc:      BMI (Calculated):      EKG sinus tach with right bundle branch block, PR interval 186, ST depression noted in V2 and V3   Troponin of 184 and repeat troponin of 385. Hip x-ray done today is negative for any acute fractures. MRI of the brain: No acute finding and Chronic right cerebellar and right occipital infarcts. Ultrasound of the abdomen done 2 to 3 days ago shows gallbladder sludge no cholecystitis and hepatic steatosis.  Labs are notable for : -Metabolic panel showing hyponatremia of 134 glucose 137 normal kidney function bicarb of 21. -CBC showing white count of 7.7 hemoglobin of 11.1 platelets of 227. -Urinalysis shows cloudy urine with  small leukocytes 35 WBCs.  In the ED pt received: Medications  heparin ADULT infusion 100 units/mL (25000 units/277mL) (750 Units/hr Intravenous New Bag/Given 01/07/23 2029)  clopidogrel (PLAVIX) tablet 75 mg (has no administration in time range)  fluticasone furoate-vilanterol (BREO ELLIPTA) 200-25 MCG/ACT 1 puff (has no administration in time range)    And  umeclidinium bromide (INCRUSE ELLIPTA) 62.5 MCG/ACT 1 puff (has no administration in time range)  metoprolol succinate (TOPROL-XL) 24 hr tablet 100 mg (has no administration in time range)  sertraline (ZOLOFT) tablet 50 mg (has no administration in time range)  albuterol (PROVENTIL) (2.5 MG/3ML) 0.083% nebulizer solution 2.5 mg (has no administration in time range)  busPIRone (BUSPAR) tablet 5 mg (has no administration in time range)  levothyroxine (SYNTHROID) tablet 50 mcg (has no administration in time range)  sodium chloride flush (NS) 0.9 % injection 3 mL (has no administration in time range)  sodium chloride flush (NS) 0.9 % injection 3 mL (has no administration in time range)  0.9 %  sodium chloride infusion (has no administration in time range)  aspirin EC tablet 81 mg (has no administration in time range)  nitroGLYCERIN (NITROSTAT) SL tablet 0.4 mg (has no administration in time range)  acetaminophen (TYLENOL) tablet 650 mg (has no administration in time range)  ondansetron (ZOFRAN) injection 4 mg (has no administration in time range)  insulin aspart (novoLOG) injection 0-9 Units (has no administration in time range)  oxyCODONE-acetaminophen (PERCOCET/ROXICET) 5-325 MG per tablet 2 tablet (2 tablets Oral Given  01/07/23 1948)  heparin bolus via infusion 3,600 Units (3,600 Units Intravenous Bolus from Bag 01/07/23 2029)  iohexol (OMNIPAQUE) 350 MG/ML injection 75 mL (75 mLs Intravenous Contrast Given 01/07/23 2041)   Review of Systems  Cardiovascular:  Positive for chest pain.  Musculoskeletal:  Positive for falls.   Past  Medical History:  Diagnosis Date   Allergy    Anemia    Anxiety    Arthritis    Asthma    Blood transfusion without reported diagnosis    C. difficile diarrhea 04/07/2016   CAP (community acquired pneumonia) 03/21/2015   Combined forms of age-related cataract of both eyes 05/06/2016   COPD (chronic obstructive pulmonary disease) (HCC)    CVA (cerebral vascular accident) (HCC) 06/17/2018   Diabetes mellitus without complication (HCC)    Displacement of lumbar intervertebral disc without myelopathy    Emphysema of lung (HCC)    GERD (gastroesophageal reflux disease)    Glaucoma    History of chicken pox    Hyperlipidemia    Hypertension    NSTEMI (non-ST elevated myocardial infarction) (HCC) 01/07/2023   Pneumonia 03/29/2015   Sepsis (HCC) 03/17/2016   Shortness of breath    Small cell lung cancer (HCC)    Past Surgical History:  Procedure Laterality Date   BALLOON DILATION  11/20/2022   Procedure: BALLOON DILATION;  Surgeon: Norma Fredrickson, Boykin Nearing, MD;  Location: [facility] ENDOSCOPY;  Service: Gastroenterology;;   BIOPSY  11/20/2022   Procedure: BIOPSY;  Surgeon: Norma Fredrickson, Boykin Nearing, MD;  Location: [facility] ENDOSCOPY;  Service: Gastroenterology;;   CESAREAN SECTION     ESOPHAGOGASTRODUODENOSCOPY N/A 05/17/2022   Procedure: ESOPHAGOGASTRODUODENOSCOPY (EGD);  Surgeon: [name], MD;  Location: [facility] ENDOSCOPY;  Service: Endoscopy;  Laterality: N/A;   ESOPHAGOGASTRODUODENOSCOPY N/A 07/10/2022   Procedure: ESOPHAGOGASTRODUODENOSCOPY (EGD);  Surgeon: Toledo, Boykin Nearing, MD;  Location: [facility] ENDOSCOPY;  Service: Gastroenterology;  Laterality: N/A;   ESOPHAGOGASTRODUODENOSCOPY (EGD) WITH PROPOFOL N/A 02/08/2022   Procedure: ESOPHAGOGASTRODUODENOSCOPY (EGD) WITH PROPOFOL;  Surgeon: Wyline Mood, MD;  Location: [facility] ENDOSCOPY;  Service: Gastroenterology;  Laterality: N/A;   ESOPHAGOGASTRODUODENOSCOPY (EGD) WITH PROPOFOL N/A 11/20/2022   Procedure: ESOPHAGOGASTRODUODENOSCOPY (EGD) WITH PROPOFOL;  Surgeon: Toledo,  Boykin Nearing, MD;  Location: [facility] ENDOSCOPY;  Service: Gastroenterology;  Laterality: N/A;   ESOPHAGOGASTRODUODENOSCOPY (EGD) WITH PROPOFOL N/A 12/25/2022   Procedure: ESOPHAGOGASTRODUODENOSCOPY (EGD) WITH PROPOFOL;  Surgeon: Toney Reil, MD;  Location: [facility] ENDOSCOPY;  Service: Gastroenterology;  Laterality: N/A;   TUBAL LIGATION     UPPER GI ENDOSCOPY      reports that she quit smoking about 6 years ago. Her smoking use included cigarettes. She started smoking about 51 years ago. She has a 11.3 pack-year smoking history. She has never used smokeless tobacco. She reports that she does not drink alcohol and does not use drugs.  Allergies  Allergen Reactions   Other Anaphylaxis   Penicillins Anaphylaxis, Rash and Other (See Comments)    Reaction:  Tongue swelling  Has patient had a PCN reaction causing immediate rash, facial/tongue/throat swelling, SOB or lightheadedness with hypotension:  Yes   Has patient had a PCN reaction causing severe rash involving mucus membranes or skin necrosis: No Has patient had a PCN reaction that required hospitalization No Has patient had a PCN reaction occurring within the last 10 years: No If all of the above answers are "NO", then may proceed with Cephalosporin use.   Yellow Jacket Venom [Bee Venom] Anaphylaxis   Codeine Hives   Crestor [Rosuvastatin] Nausea And Vomiting  Corrected prior adverse reaction per [facility] [employer]. On 12/02/3011  patient reported N & V when she takes Crestor   Gemfibrozil Rash, Nausea And Vomiting and Swelling   Trazodone And Nefazodone Nausea And Vomiting   Lipitor [Atorvastatin] Nausea Only    By patient report 12/02/2011. Had also been prescribed pravastatin and lovastatin by previous MD. Unclear if those caused same side effects.     Family History  Problem Relation Age of Onset   Heart failure Mother    Heart disease Mother    Stroke Mother    Heart disease Brother    COPD Brother    Cancer Maternal Aunt         Breast Cancer    Prior to Admission medications   Medication Sig Start Date End Date Taking? Authorizing Provider  albuterol (PROVENTIL) (2.5 MG/3ML) 0.083% nebulizer solution Take 2.5 mg by nebulization every 6 (six) hours.    [provider]  busPIRone (BUSPAR) 5 MG tablet Take 5 mg by mouth 3 (three) times daily.    [provider]  cholecalciferol (VITAMIN D3) 25 MCG (1000 UNIT) tablet Take 1,000 Units by mouth daily.    [provider]  clonazePAM (KLONOPIN) 0.5 MG tablet TAKE ONE TABLET (0.5 mg total) BY MOUTH TWICE DAILY AS NEEDED Patient not taking: Reported on 11/11/2022 11/12/21   [provider]  clopidogrel (PLAVIX) 75 MG tablet Take 1 tablet (75 mg total) by mouth daily. 04/23/21   Malva Limes, MD  diclofenac Sodium (VOLTAREN) 1 % GEL Apply 4 g topically 4 (four) times daily. Patient taking differently: Apply 2 g topically every 4 (four) hours as needed (pain). (Apply to lower back/back of neck) 06/19/21   Malva Limes, MD  econazole nitrate 1 % cream Apply 1 Application topically 2 (two) times daily. 10/28/22   [provider]  ezetimibe (ZETIA) 10 MG tablet Take 10 mg by mouth daily. 10/17/22   [provider]  Fluticasone-Umeclidin-Vilant (TRELEGY ELLIPTA) 200-62.5-25 MCG/ACT AEPB Inhale 1 puff into the lungs daily. 07/19/22   Raechel Chute, MD  gabapentin (NEURONTIN) 300 MG capsule Take 300 mg by mouth 2 (two) times daily.    [provider]  hydrocortisone cream 1 % Apply 1 Application topically every 6 (six) hours as needed for itching.    [provider]  ketoconazole (NIZORAL) 2 % shampoo Apply 1 Application topically 2 (two) times a week.    [provider]  levothyroxine (SYNTHROID) 50 MCG tablet Take 50 mcg by mouth daily before breakfast.    [provider]  lidocaine 4 % Place 1 patch onto the skin daily. (Apply to neck and remove after 12 hours)    [provider]   loperamide (IMODIUM) 2 MG capsule Take by mouth as needed for diarrhea or loose stools.    [provider]  Magnesium 400 MG TABS Take 1 tablet by mouth 2 (two) times daily.    [provider]  metFORMIN (GLUCOPHAGE) 500 MG tablet Take 500 mg by mouth 2 (two) times daily with a meal.    [provider]  metoprolol succinate (TOPROL XL) 100 MG 24 hr tablet Take 1 tablet (100 mg total) by mouth daily. Take with or immediately following a meal. 12/20/22 12/20/23  Adrian Blackwater A, MD  montelukast (SINGULAIR) 10 MG tablet Take 10 mg by mouth at bedtime. Patient not taking: Reported on 12/13/2022    [provider]  nystatin (MYCOSTATIN/NYSTOP) powder Apply 1 Application topically  every 12 (twelve) hours as needed (for fungal rash).    [provider]  omeprazole (PRILOSEC) 40 MG capsule Take 1 capsule (40 mg total) by mouth 2 (two) times daily. Take in the morning and at dinnertime. Patient taking differently: Take 40 mg by mouth in the morning and at bedtime. 02/09/22   Sunnie Nielsen, DO  ondansetron (ZOFRAN) 4 MG tablet Take 4 mg by mouth every 6 (six) hours as needed for nausea. 02/05/22   [provider]  oxyCODONE-acetaminophen (PERCOCET) 10-325 MG tablet TAKE ONE TABLET BY MOUTH EVERY 4 HOURS AS NEEDED (VIAL) Patient taking differently: Take 1 tablet by mouth every 6 (six) hours as needed for pain. 11/28/21   Malva Limes, MD  polyethylene glycol (MIRALAX / GLYCOLAX) 17 g packet Take 17 g by mouth daily as needed for mild constipation.    [provider]  senna (SENOKOT) 8.6 MG TABS tablet Take 2 tablets by mouth daily. 01/31/22   [provider]  sertraline (ZOLOFT) 50 MG tablet Take 1 tablet (50 mg total) by mouth daily. 06/15/21   Malva Limes, MD  spironolactone (ALDACTONE) 25 MG tablet Take 0.5 tablets (12.5 mg total) by mouth daily. 12/20/22 12/20/23  Laurier Nancy, MD  sulfamethoxazole-trimethoprim (BACTRIM DS)  800-160 MG tablet Take 1 tablet by mouth 2 (two) times daily. 01/04/23   Sharman Cheek, MD  vitamin B-12 (CYANOCOBALAMIN) 1000 MCG tablet Take 1 tablet (1,000 mcg total) by mouth daily. 05/23/21   Debera Lat, PA-C  zaleplon (SONATA) 5 MG capsule Take 5 mg by mouth at bedtime.    [provider]     Vitals:   01/07/23 1930 01/07/23 2000 01/07/23 2030 01/07/23 2359  BP: 132/75 (!) 147/84 112/80 117/74  Pulse: 89 98 98 95  Resp: 15 13 14 11   Temp:      TempSrc:      SpO2: 96% 100% 96% 95%  Weight:      Height:       Physical Exam Constitutional:      Appearance: She is obese. She is ill-appearing.  HENT:     Head: Normocephalic and atraumatic.     Right Ear: External ear normal.     Left Ear: External ear normal.  Eyes:     Extraocular Movements: Extraocular movements intact.  Cardiovascular:     Rate and Rhythm: Regular rhythm. Tachycardia present.     Heart sounds: Normal heart sounds.  Pulmonary:     Effort: Pulmonary effort is normal.     Breath sounds: Normal breath sounds.  Abdominal:     General: Bowel sounds are normal.     Palpations: Abdomen is soft.  Musculoskeletal:     Right lower leg: No edema.     Left lower leg: No edema.  Neurological:     General: No focal deficit present.     Mental Status: She is alert and oriented to person, place, and time.  Psychiatric:        Behavior: Behavior normal.   Labs on Admission: I have personally reviewed following labs and imaging studies Results for orders placed or performed during the hospital encounter of 01/07/23 (from the past 24 hour(s))  Urinalysis, Routine w reflex microscopic -Urine, Clean Catch     Status: Abnormal   Collection Time: 01/07/23  1:58 PM  Result Value Ref Range   Color, Urine YELLOW (A) YELLOW   APPearance CLOUDY (A) CLEAR   Specific Gravity, Urine 1.026 1.005 - 1.030  pH 5.0 5.0 - 8.0   Glucose, UA NEGATIVE NEGATIVE mg/dL   Hgb urine dipstick NEGATIVE NEGATIVE   Bilirubin  Urine NEGATIVE NEGATIVE   Ketones, ur NEGATIVE NEGATIVE mg/dL   Protein, ur NEGATIVE NEGATIVE mg/dL   Nitrite NEGATIVE NEGATIVE   Leukocytes,Ua SMALL (A) NEGATIVE   RBC / HPF 0-5 0 - 5 RBC/hpf   WBC, UA 0-5 0 - 5 WBC/hpf   Bacteria, UA NONE SEEN NONE SEEN   Squamous Epithelial / HPF 0-5 0 - 5 /HPF   Mucus PRESENT   Basic metabolic panel     Status: Abnormal   Collection Time: 01/07/23  2:27 PM  Result Value Ref Range   Sodium 134 (L) 135 - 145 mmol/L   Potassium 4.0 3.5 - 5.1 mmol/L   Chloride 100 98 - 111 mmol/L   CO2 21 (L) 22 - 32 mmol/L   Glucose, Bld 137 (H) 70 - 99 mg/dL   BUN 14 8 - 23 mg/dL   Creatinine, Ser 1.61 0.44 - 1.00 mg/dL   Calcium 8.6 (L) 8.9 - 10.3 mg/dL   GFR, Estimated >09 >60 mL/min   Anion gap 13 5 - 15  CBC     Status: Abnormal   Collection Time: 01/07/23  2:27 PM  Result Value Ref Range   WBC 7.7 4.0 - 10.5 K/uL   RBC 4.08 3.87 - 5.11 MIL/uL   Hemoglobin 11.1 (L) 12.0 - 15.0 g/dL   HCT 45.4 (L) 09.8 - 11.9 %   MCV 85.3 80.0 - 100.0 fL   MCH 27.2 26.0 - 34.0 pg   MCHC 31.9 30.0 - 36.0 g/dL   RDW 14.7 82.9 - 56.2 %   Platelets 227 150 - 400 K/uL   nRBC 0.0 0.0 - 0.2 %  Troponin I (High Sensitivity)     Status: Abnormal   Collection Time: 01/07/23  2:27 PM  Result Value Ref Range   Troponin I (High Sensitivity) 184 (HH) <18 ng/L  Troponin I (High Sensitivity)     Status: Abnormal   Collection Time: 01/07/23  6:32 PM  Result Value Ref Range   Troponin I (High Sensitivity) 385 (HH) <18 ng/L  CK     Status: None   Collection Time: 01/07/23  6:32 PM  Result Value Ref Range   Total CK 163 38 - 234 U/L  Hepatic function panel     Status: None   Collection Time: 01/07/23  6:32 PM  Result Value Ref Range   Total Protein 7.3 6.5 - 8.1 g/dL   Albumin 3.9 3.5 - 5.0 g/dL   AST 29 15 - 41 U/L   ALT 16 0 - 44 U/L   Alkaline Phosphatase 80 38 - 126 U/L   Total Bilirubin 0.6 <1.2 mg/dL   Bilirubin, Direct 0.1 0.0 - 0.2 mg/dL   Indirect Bilirubin 0.5 0.3  - 0.9 mg/dL   CBC: Recent Labs  Lab 01/04/23 1638 01/07/23 1427  WBC 14.4* 7.7  HGB 12.5 11.1*  HCT 39.3 34.8*  MCV 84.5 85.3  PLT 202 227   Basic Metabolic Panel: Recent Labs  Lab 01/04/23 1638 01/07/23 1427  NA 135 134*  K 3.9 4.0  CL 98 100  CO2 24 21*  GLUCOSE 189* 137*  BUN 12 14  CREATININE 0.76 0.92  CALCIUM 9.0 8.6*   GFR: Estimated Creatinine Clearance: 54.3 mL/min (by C-G formula based on SCr of 0.92 mg/dL). Liver Function Tests: Recent Labs  Lab 01/04/23 1638 01/07/23 1832  AST  31 29  ALT 19 16  ALKPHOS 78 80  BILITOT 1.4* 0.6  PROT 7.9 7.3  ALBUMIN 4.4 3.9   Recent Labs  Lab 01/04/23 1638  LIPASE 22   Urinalysis    Component Value Date/Time   COLORURINE YELLOW (A) 01/07/2023 1358   APPEARANCEUR CLOUDY (A) 01/07/2023 1358   APPEARANCEUR Clear 09/05/2022 1045   LABSPEC 1.026 01/07/2023 1358   PHURINE 5.0 01/07/2023 1358   GLUCOSEU NEGATIVE 01/07/2023 1358   HGBUR NEGATIVE 01/07/2023 1358   BILIRUBINUR NEGATIVE 01/07/2023 1358   BILIRUBINUR Negative 09/05/2022 1045   KETONESUR NEGATIVE 01/07/2023 1358   PROTEINUR NEGATIVE 01/07/2023 1358   UROBILINOGEN 0.2 04/23/2021 1043   NITRITE NEGATIVE 01/07/2023 1358   LEUKOCYTESUR SMALL (A) 01/07/2023 1358   Unresulted Labs (From admission, onward)     Start     Ordered   01/08/23 0500  CBC  Tomorrow morning,   R        01/07/23 2005   01/08/23 0500  Lipid panel  Tomorrow morning,   R        01/07/23 2028   01/08/23 0500  Basic metabolic panel  Tomorrow morning,   R        01/07/23 2028   01/08/23 0230  Heparin level (unfractionated)  Once-Timed,   TIMED        01/07/23 2039   01/07/23 2029  Hemoglobin A1c  Once,   R       Comments: To assess prior glycemic control    01/07/23 2029   01/07/23 2024  TSH  Once,   R        01/07/23 2028   01/07/23 2024  T4, free  Once,   R        01/07/23 2028   01/07/23 2023  HIV Antibody (routine testing w rflx)  (HIV Antibody (Routine testing w reflex)  panel)  Once,   R        01/07/23 2028   01/07/23 2013  Lactic acid, plasma  (Lactic Acid)  STAT Now then every 3 hours,   STAT      01/07/23 2012   01/07/23 2005  Protime-INR  ONCE - STAT,   STAT        01/07/23 2004   01/07/23 2004  APTT  ONCE - STAT,   STAT       Comments: To start heparin drip    01/07/23 2004           Radiological Exams on Admission: CT Angio Chest Pulmonary Embolism (PE) W or WO Contrast  Result Date: 01/07/2023 CLINICAL DATA:  Recent syncopal episode, history of lung carcinoma, initial encounter EXAM: CT ANGIOGRAPHY CHEST WITH CONTRAST TECHNIQUE: Multidetector CT imaging of the chest was performed using the standard protocol during bolus administration of intravenous contrast. Multiplanar CT image reconstructions and MIPs were obtained to evaluate the vascular anatomy. RADIATION DOSE REDUCTION: This exam was performed according to the departmental dose-optimization program which includes automated exposure control, adjustment of the mA and/or kV according to patient size and/or use of iterative reconstruction technique. CONTRAST:  75mL OMNIPAQUE IOHEXOL 350 MG/ML SOLN COMPARISON:  12/30/2022 FINDINGS: Cardiovascular: Thoracic aorta shows mild atherosclerotic calcifications. No aneurysmal dilatation or dissection is noted. No cardiac enlargement is seen. Coronary calcifications are noted. The pulmonary artery shows a normal branching pattern bilaterally. No filling defect to suggest pulmonary embolism is seen. Mediastinum/Nodes: Thoracic inlet is within normal limits. No hilar or mediastinal adenopathy is noted. Stable distal paraesophageal node  is noted measuring 8 mm in short axis. Lungs/Pleura: Lungs are well aerated bilaterally. Diffuse emphysematous changes are seen. No acute infiltrate is seen. Post radiation changes are noted in the medial aspect of the right upper and lower lobes stable from the prior study. No sizable effusion is seen. No parenchymal nodules are  noted. Upper Abdomen: Visualized upper abdomen shows e acute abnormality. Musculoskeletal: Mild degenerative changes of the thoracic spine are noted. No acute rib abnormality is seen. Review of the MIP images confirms the above findings. IMPRESSION: No evidence of pulmonary emboli. Post radiation changes in the medial right hemithorax. These are stable from the prior exam. Stable distal paraesophageal lymph node. Aortic Atherosclerosis (ICD10-I70.0). Electronically Signed   By: Alcide Clever M.D.   On: 01/07/2023 20:55   MR BRAIN WO CONTRAST  Result Date: 01/07/2023 CLINICAL DATA:  Neuro deficit with acute stroke suspected. EXAM: MRI HEAD WITHOUT CONTRAST TECHNIQUE: Multiplanar, multiecho pulse sequences of the brain and surrounding structures were obtained without intravenous contrast. COMPARISON:  Head CT 01/04/2023 FINDINGS: Brain: No acute infarction, hemorrhage, hydrocephalus, extra-axial collection or mass lesion. Extensive chronic infarction in the right cerebellum and to a lesser extent in the right occipital lobe. Ischemic gliosis in the periventricular white matter is mild for age. Mild cerebral volume loss, more pronounced volume loss in the posterior fossa, stable from a 2022 brain MRI. Vascular: Major flow voids are stable, including diminutive right vertebral artery Skull and upper cervical spine: Normal marrow signal Sinuses/Orbits: Unremarkable IMPRESSION: 1. No acute finding. 2. Chronic right cerebellar and right occipital infarcts. Electronically Signed   By: Tiburcio Pea M.D.   On: 01/07/2023 19:43   DG Hip Unilat W or Wo Pelvis 2-3 Views Left  Result Date: 01/07/2023 CLINICAL DATA:  Fall and left hip pain. EXAM: DG HIP (WITH OR WITHOUT PELVIS) 2-3V LEFT COMPARISON:  None Available. FINDINGS: There is no acute fracture or dislocation. The bones are osteopenic. Mild bilateral hip arthritic changes. The soft tissues are unremarkable. IMPRESSION: 1. No acute fracture or dislocation. 2.  Osteopenia. Electronically Signed   By: Elgie Collard M.D.   On: 01/07/2023 15:42    Data Reviewed: Relevant notes from primary care and specialist visits, past discharge summaries as available in EHR, including Care Everywhere. Prior diagnostic testing as pertinent to current admission diagnoses Updated medications and problem lists for reconciliation ED course, including vitals, labs, imaging, treatment and response to treatment Triage notes, nursing and pharmacy notes and ED provider's notes Notable results as noted in HPI  Assessment and Plan: * Fall Patient is recent frequent fall history and presentation is suspicious for syncopal episode, which may be cardiac or pulmonary, we will obtain a CT angio chest to rule out pulmonary embolism. Addendum: Patient CT angio was negative for pulmonary embolism and or any cancer or structural issues.  NSTEMI (non-ST elevated myocardial infarction) [facility]) Patient noted to have abnormal EKG and troponin elevations with unstable angina symptoms of chest pain over the past 48 hours.  Will continue patient on heparin gtt..  Nitroglycerin.  Continue metoprolol.  Lipitor not given due to allergy.  Will continue with Zetia.  Will obtain an A1c, free T4, TSH, lipid panel.  Cardiology consult. Patient CT angio is negative will continue with NSTEMI treatment and consult cardiology.  Small cell lung cancer, right (HCC) Will obtain CT imaging as patient's history of small cell lung cancer consult give rise to recurrence and possible mets and/or pericardial effusion.  Also this is very in  her case for excluding pulmonary embolism.  Glaucoma Resume any home eyedrops.  Anemia Current hemoglobin is stable at 11.1, MCV at 85.3, normal RDW.  Patient noted to have B12 deficiency with a level less than 50 about a year ago.  And will continue patient's p.o. B12 supplement as well.  Type 2 diabetes mellitus (HCC) Last A1c was a year ago at 6.9 and current regimen  includes metformin and NovoLog. We will cover patient with glycemic protocol currently.  COPD (chronic obstructive pulmonary disease) (HCC) Clinically stable no wheezing noted on exam no respiratory distress. Continue with patient's Trelegy Ellipta, Singulair, as needed albuterol.    DVT prophylaxis:  Heparin GTT  Consults:  Cardiology: [facility]  Advance Care Planning:    Code Status: Full Code   Family Communication:  Daughter at bedside  Disposition Plan:  SNF  Severity of Illness: The appropriate patient status for this patient is INPATIENT. Inpatient status is judged to be reasonable and necessary in order to provide the required intensity of service to ensure the patient's safety. The patient's presenting symptoms, physical exam findings, and initial radiographic and laboratory data in the context of their chronic comorbidities is felt to place them at high risk for further clinical deterioration. Furthermore, it is not anticipated that the patient will be medically stable for discharge from the hospital within 2 midnights of admission.   * I certify that at the point of admission it is my clinical judgment that the patient will require inpatient hospital care spanning beyond 2 midnights from the point of admission due to high intensity of service, high risk for further deterioration and high frequency of surveillance required.*  Author: Gertha Calkin, MD 01/08/2023 12:55 AM  For on call review [email or web address].  Orders Placed This Encounter  Procedures   DG Hip Unilat W or Wo Pelvis 2-3 Views Left    Standing Status:   Standing    Number of Occurrences:   1    Order Specific Question:   Reason for Exam (SYMPTOM  OR DIAGNOSIS REQUIRED)    Answer:   fall   MR BRAIN WO CONTRAST    Standing Status:   Standing    Number of Occurrences:   1    Order Specific Question:   What is the patient's sedation requirement?    Answer:   No Sedation    Order Specific Question:   Does the  patient have a pacemaker or implanted devices?    Answer:   No    Order Specific Question:   Radiology Contrast Protocol - do NOT remove file path    Answer:   [email or web address]   CT Angio Chest Pulmonary Embolism (PE) W or WO Contrast    Standing Status:   Standing    Number of Occurrences:   1    Order Specific Question:   Does the patient have a contrast media/X-ray dye allergy?    Answer:   No    Order Specific Question:   If indicated for the ordered procedure, I authorize the administration of contrast media per Radiology protocol    Answer:   Yes   Basic metabolic panel    Standing Status:   Standing    Number of Occurrences:   1   CBC    Standing Status:   Standing    Number of Occurrences:   1   Urinalysis, Routine w reflex microscopic -Urine, Clean Catch    Standing Status:   Standing  Number of Occurrences:   1    Order Specific Question:   Specimen Source    Answer:   Urine, Clean Catch [76]   CK    Standing Status:   Standing    Number of Occurrences:   1   APTT    To start heparin drip    Standing Status:   Standing    Number of Occurrences:   1   Protime-INR    Standing Status:   Standing    Number of Occurrences:   1   CBC    Standing Status:   Standing    Number of Occurrences:   1   Lactic acid, plasma    Standing Status:   Standing    Number of Occurrences:   2   HIV Antibody (routine testing w rflx)    Standing Status:   Standing    Number of Occurrences:   1   TSH    Standing Status:   Standing    Number of Occurrences:   1   T4, free    Standing Status:   Standing    Number of Occurrences:   1   Lipid panel    Standing Status:   Standing    Number of Occurrences:   1   Basic metabolic panel    Standing Status:   Standing    Number of Occurrences:   1   Hemoglobin A1c    To assess prior glycemic control    Standing Status:   Standing    Number of Occurrences:   1   Heparin level (unfractionated)     Standing Status:   Standing    Number of Occurrences:   1   Hepatic function panel    Standing Status:   Standing    Number of Occurrences:   1   Diet NPO time specified    Standing Status:   Standing    Number of Occurrences:   1   Document Height and Actual Weight    Use scales to weigh patient, not stated or estimated weight.    Standing Status:   Standing    Number of Occurrences:   1   Vital signs    Standing Status:   Standing    Number of Occurrences:   1   Cardiac monitoring    Standing Status:   Standing    Number of Occurrences:   1   Notify physician (specify) Notify physician for pulse less than 50 or greater than 100, systolic BP less than 100 or greater than 160 ( before calling MD verify blood pressure manually), ongoing or recurrent chest pain.    Notify physician for pulse less than 50 or greater than 100, systolic BP less than 100 or greater than 160 ( before calling MD verify blood pressure manually), ongoing or recurrent chest pain.    Standing Status:   Standing    Number of Occurrences:   20   Apply Angina, Rule Out Myocardial Infarction Care Plan    Standing Status:   Standing    Number of Occurrences:   1   Insert and maintain IV access or saline lock    Standing Status:   Standing    Number of Occurrences:   1   Patient Education:    Patient education per clinical pathway.    Standing Status:   Standing    Number of Occurrences:   1   If diabetic or glucose  greater than 140 mg/dL, notify MD for Glycemic Control (SSI) Order Set    Standing Status:   Standing    Number of Occurrences:   20   Patient education per clinical pathway    Standing Status:   Standing    Number of Occurrences:   1   Initiate Oral Care Protocol    Standing Status:   Standing    Number of Occurrences:   1   Initiate Carrier Fluid Protocol    Standing Status:   Standing    Number of Occurrences:   1   Nurse to provide smoking / tobacco cessation education    Standing Status:    Standing    Number of Occurrences:   1   Beta blocker already ordered    Standing Status:   Standing    Number of Occurrences:   1   RN may order Cardiology PRN Orders utilizing "Cardiology PRN medications" (through manage orders) for the following patient needs:    indigestion (Maalox), cough (Robitussin DM), constipation (MOM), diarrhea (Imodium), or minor skin irritation (Hydrocortisone Cream)    Standing Status:   Standing    Number of Occurrences:   [phone]   Up with assistance only    Standing Status:   Standing    Number of Occurrences:   20   Strict intake and output    Standing Status:   Standing    Number of Occurrences:   1   Apply Diabetes Mellitus Care Plan    Standing Status:   Standing    Number of Occurrences:   1   STAT CBG when hypoglycemia is suspected. If treated, recheck every 15 minutes after each treatment until CBG >/= 70 mg/dl    Standing Status:   Standing    Number of Occurrences:   1   Refer to Hypoglycemia Protocol Sidebar Report for treatment of CBG < 70 mg/dl    Standing Status:   Standing    Number of Occurrences:   1   No HS correction Insulin    Standing Status:   Standing    Number of Occurrences:   1   Full code    Standing Status:   Standing    Number of Occurrences:   1    Order Specific Question:   By:    Answer:   Other   heparin per pharmacy consult    Standing Status:   Standing    Number of Occurrences:   1    Order Specific Question:   Indication:    Answer:   ACS / STEMI   Consult to hospitalist    Standing Status:   Standing    Number of Occurrences:   1    Order Specific Question:   Place call to:    Answer:   [facility]    Order Specific Question:   Reason for Consult    Answer:   Admit    Order Specific Question:   Diagnosis/Clinical Info for Consult:    Answer:   NSTEMI   Inpatient consult to Cardiology [facility] only - Select consulting group: [facility]; Consult Timeframe: ROUTINE - requires response within 24 hours; Reason for Consult?  NSTEMI    Standing Status:   Standing    Number of Occurrences:   1    Order Specific Question:   [facility] only - Select consulting group    Answer:   [facility]    Order Specific Question:   Consult Timeframe  Answer:   ROUTINE - requires response within 24 hours    Order Specific Question:   Reason for Consult?    Answer:   NSTEMI   Oxygen therapy    May discontinue if sats > 92% and patient not symptomatic    Standing Status:   Standing    Number of Occurrences:   1   CBG monitoring, ED    Standing Status:   Standing    Number of Occurrences:   1   ED EKG    Altered mental status    Standing Status:   Standing    Number of Occurrences:   1    Order Specific Question:   Reason for Exam    Answer:   Other (See Comments)   EKG 12-lead    Standing Status:   Standing    Number of Occurrences:   20    Order Specific Question:   Notes    Answer:   Chest Pain   EKG 12-Lead    Standing Status:   Standing    Number of Occurrences:   1    Order Specific Question:   Notes    Answer:   chest pain   Admit to Inpatient (patient's expected length of stay will be greater than 2 midnights or inpatient only procedure)    Standing Status:   Standing    Number of Occurrences:   1    Order Specific Question:   Hospital Area    Answer:   [facility] [[id number]]    Order Specific Question:   Level of Care    Answer:   Progressive [102]    Order Specific Question:   Admit to Progressive based on following criteria    Answer:   CARDIOVASCULAR & THORACIC of moderate stability with acute coronary syndrome symptoms/low risk myocardial infarction/hypertensive urgency/arrhythmias/heart failure potentially compromising stability and stable post cardiovascular intervention patients.    Order Specific Question:   Covid Evaluation    Answer:   Asymptomatic - no recent exposure (last 10 days) testing not required    Order Specific Question:   Diagnosis    Answer:   Fall [[id number]]    Order  Specific Question:   Admitting Physician    Answer:   Darrold Junker    Order Specific Question:   Attending Physician    Answer:   Darrold Junker    Order Specific Question:   Certification:    Answer:   I certify this patient will need inpatient services for at least 2 midnights    Order Specific Question:   Expected Medical Readiness    Answer:   01/09/2023

## 2023-01-07 NOTE — Assessment & Plan Note (Signed)
Patient noted to have abnormal EKG and troponin elevations with unstable angina symptoms of chest pain over the past 48 hours.  Will continue patient on heparin gtt..  Nitroglycerin.  Continue metoprolol.  Lipitor not given due to allergy.  Will continue with Zetia.  Will obtain an A1c, free T4, TSH, lipid panel.  Cardiology consult. Patient CT angio is negative will continue with NSTEMI treatment and consult cardiology.

## 2023-01-07 NOTE — ED Notes (Signed)
Pt here [facility] from [facility] to several falls-7 falls in 4 days. HX of stroke with R sided deficits.   VSS per EMS

## 2023-01-07 NOTE — Assessment & Plan Note (Signed)
Clinically stable no wheezing noted on exam no respiratory distress. Continue with patient's Trelegy Ellipta, Singulair, as needed albuterol.

## 2023-01-07 NOTE — Assessment & Plan Note (Addendum)
Will obtain CT imaging as patient's history of small cell lung cancer consult give rise to recurrence and possible mets and/or pericardial effusion.  Also this is very in her case for excluding pulmonary embolism.

## 2023-01-07 NOTE — Assessment & Plan Note (Signed)
Resume any home eyedrops.

## 2023-01-07 NOTE — Assessment & Plan Note (Signed)
Last A1c was a year ago at 6.9 and current regimen includes metformin and NovoLog. We will cover patient with glycemic protocol currently.

## 2023-01-07 NOTE — ED Triage Notes (Signed)
Patient to ED via [facility] from [facility] for multiple falls. Seen for same on 12/7. Denies complaints from fall today except for left hip pain. Does take blood thinners. Pt denies hitting head but family states she did. States she is having some generalized weakness.

## 2023-01-07 NOTE — Assessment & Plan Note (Signed)
Patient is recent frequent fall history and presentation is suspicious for syncopal episode, which may be cardiac or pulmonary, we will obtain a CT angio chest to rule out pulmonary embolism. Addendum: Patient CT angio was negative for pulmonary embolism and or any cancer or structural issues.

## 2023-01-07 NOTE — Assessment & Plan Note (Signed)
Current hemoglobin is stable at 11.1, MCV at 85.3, normal RDW.  Patient noted to have B12 deficiency with a level less than 50 about a year ago.  And will continue patient's p.o. B12 supplement as well.

## 2023-01-07 NOTE — ED Provider Notes (Signed)
[facility] Provider Note    Event Date/Time   First MD Initiated Contact with Patient 01/07/23 1646     (approximate)  History   Chief Complaint: Fall  HPI  Yvette Riley is a 67 y.o. female with a past medical history of anemia, anxiety, COPD, several small CVAs, diabetes, gastric reflux, hypertension, hyperlipidemia who presents from her nursing facility for multiple falls.  According to the patient and the daughter patient lives at [facility], states a history of falls over the past year or 2 but daughter states it is 1 fall every 6 months or so.  She states over the last 3 days the patient has had 6 or 7 falls including tonight.  Believes the patient has hit her head at times although the patient does not believe she has.  Takes Plavix as her only blood thinner per daughter.  Patient denies any focal weakness or numbness denies any dysuria any recent illnesses such as fever cough congestion vomiting or diarrhea.  States she had a COVID test performed over the weekend that was negative due to the increased weakness.  Physical Exam   Triage Vital Signs: ED Triage Vitals  Encounter Vitals Group     BP 01/07/23 1353 106/71     Systolic BP Percentile --      Diastolic BP Percentile --      Pulse Rate 01/07/23 1353 99     Resp 01/07/23 1353 18     Temp 01/07/23 1353 98.2 F (36.8 C)     Temp Source 01/07/23 1353 Oral     SpO2 01/07/23 1353 96 %     Weight 01/07/23 1354 180 lb (81.6 kg)     Height 01/07/23 1354 4\' 10"  (1.473 m)     Head Circumference --      Peak Flow --      Pain Score 01/07/23 1354 8     Pain Loc --      Pain Education --      Exclude from Growth Chart --     Most recent vital signs: Vitals:   01/07/23 1353  BP: 106/71  Pulse: 99  Resp: 18  Temp: 98.2 F (36.8 C)  SpO2: 96%    General: Awake, no distress.  CV:  Good peripheral perfusion.  Regular rate and rhythm  Resp:  Normal effort.  Equal breath sounds  bilaterally.  Abd:  No distention.  Soft, nontender.  No rebound or guarding. Other:  Patient has good grip strength bilaterally and 5/5 strength in bilateral upper extremities.  Does have mild decree strength in left lower extremity 4+/5 compared to 5/5 in the right lower extremity.  No cranial nerve deficits.   ED Results / Procedures / Treatments   EKG  EKG viewed and interpreted by myself shows sinus tachycardia 101 bpm with a narrow QRS, normal axis, normal intervals, nonspecific ST changes.  RADIOLOGY  I reviewed and interpreted the hip x-ray images.  No fracture on my evaluation. Radiology has reviewed the x-ray images.  No fracture or dislocation.  MEDICATIONS ORDERED IN ED: Medications - No data to display   IMPRESSION / MDM / ASSESSMENT AND PLAN / ED COURSE  I reviewed the triage vital signs and the nursing notes.  Patient's presentation is most consistent with acute presentation with potential threat to life or bodily function.  Patient presents to the emergency department for increased falls over the last 3 days.  Patient initially was complaining of  some left hip pain but denies any currently.  X-rays negative.  Patient's CBC is reassuring chemistry is reassuring.  We will check a urine sample, I have added on a troponin as a precaution.  Will also obtain an MRI of the brain given the patient's multiple strokes in the past per daughter.  Overall patient appears well.  Will continue to closely monitor while awaiting results.  Patient's troponin has resulted at 184.  Will await MRI results prior to starting heparin to ensure that there is no acute infarct.  Patient's MRI has resulted negative for acute infarct does show chronic infarct.  Patient's repeat troponin has gone up to 385.  Patient continues to deny any chest pain or shortness of breath.  We will start the patient on heparin we will admit to the hospital service.  EKG does not appear to show any significant  finding.  CRITICAL CARE Performed by: Minna Antis   Total critical care time: 30 minutes  Critical care time was exclusive of separately billable procedures and treating other patients.  Critical care was necessary to treat or prevent imminent or life-threatening deterioration.  Critical care was time spent personally by me on the following activities: development of treatment plan with patient and/or surrogate as well as nursing, discussions with consultants, evaluation of patient's response to treatment, examination of patient, obtaining history from patient or surrogate, ordering and performing treatments and interventions, ordering and review of laboratory studies, ordering and review of radiographic studies, pulse oximetry and re-evaluation of patient's condition.   FINAL CLINICAL IMPRESSION(S) / ED DIAGNOSES   Falls NSTEMI  Note:  This document was prepared using Dragon voice recognition software and may include unintentional dictation errors.   Minna Antis, MD 01/07/23 1950

## 2023-01-07 NOTE — Progress Notes (Signed)
PHARMACY - ANTICOAGULATION CONSULT NOTE  Pharmacy Consult for heparin drip  Indication: chest pain/ACS  Allergies  Allergen Reactions   Other Anaphylaxis   Penicillins Anaphylaxis, Rash and Other (See Comments)    Reaction:  Tongue swelling  Has patient had a PCN reaction causing immediate rash, facial/tongue/throat swelling, SOB or lightheadedness with hypotension:  Yes   Has patient had a PCN reaction causing severe rash involving mucus membranes or skin necrosis: No Has patient had a PCN reaction that required hospitalization No Has patient had a PCN reaction occurring within the last 10 years: No If all of the above answers are "NO", then may proceed with Cephalosporin use.   Yellow Jacket Venom [Bee Venom] Anaphylaxis   Codeine Hives   Crestor [Rosuvastatin] Nausea And Vomiting    Corrected prior adverse reaction per [facility] [employer]. On 12/02/3011  patient reported N & V when she takes Crestor   Gemfibrozil Rash, Nausea And Vomiting and Swelling   Trazodone And Nefazodone Nausea And Vomiting   Lipitor [Atorvastatin] Nausea Only    By patient report 12/02/2011. Had also been prescribed pravastatin and lovastatin by previous MD. Unclear if those caused same side effects.     Patient Measurements: Height: 4\' 10"  (147.3 cm) Weight: 81.6 kg (180 lb) IBW/kg (Calculated) : 40.9 Heparin Dosing Weight: 60 kg  Vital Signs: Temp: 98.2 F (36.8 C) (12/10 1353) Temp Source: Oral (12/10 1353) BP: 112/80 (12/10 2030) Pulse Rate: 98 (12/10 2030)  Labs: Recent Labs    01/07/23 1427 01/07/23 1832  HGB 11.1*  --   HCT 34.8*  --   PLT 227  --   CREATININE 0.92  --   CKTOTAL  --  163  TROPONINIHS 184* 385*    Estimated Creatinine Clearance: 54.3 mL/min (by C-G formula based on SCr of 0.92 mg/dL).   Medical History: Past Medical History:  Diagnosis Date   Allergy    Anemia    Anxiety    Arthritis    Asthma    Blood transfusion without reported diagnosis    C. difficile  diarrhea 04/07/2016   CAP (community acquired pneumonia) 03/21/2015   Combined forms of age-related cataract of both eyes 05/06/2016   COPD (chronic obstructive pulmonary disease) (HCC)    CVA (cerebral vascular accident) (HCC) 06/17/2018   Diabetes mellitus without complication (HCC)    Displacement of lumbar intervertebral disc without myelopathy    Emphysema of lung (HCC)    GERD (gastroesophageal reflux disease)    Glaucoma    History of chicken pox    Hyperlipidemia    Hypertension    Pneumonia 03/29/2015   Sepsis (HCC) 03/17/2016   Shortness of breath    Small cell lung cancer (HCC)     Medications:  (Not in a hospital admission)  Scheduled:   albuterol  2.5 mg Nebulization Q6H   [START ON 01/08/2023] aspirin EC  81 mg Oral Daily   busPIRone  5 mg Oral TID   clopidogrel  75 mg Oral Daily   [START ON 01/08/2023] fluticasone furoate-vilanterol  1 puff Inhalation Daily   And   [START ON 01/08/2023] umeclidinium bromide  1 puff Inhalation Daily   [START ON 01/08/2023] levothyroxine  50 mcg Oral QAC breakfast   metoprolol succinate  100 mg Oral Daily   sertraline  50 mg Oral Daily   sodium chloride flush  3 mL Intravenous Q12H   Infusions:   sodium chloride     heparin 750 Units/hr (01/07/23 2029)  Assessment: 67 yo F to start heparin drip for ACS/STEMI. On plavix PTA  Hgb 11.1  plt 227  aPTT pending   INR pending  Goal of Therapy:  Heparin level 0.3-0.7 units/ml Monitor platelets by anticoagulation protocol: Yes   Plan:  Give 3600 units bolus x 1 Start heparin infusion at 750 units/hr Check anti-Xa level in 6 hours and daily while on heparin Continue to monitor H&H and platelets  Machele Deihl A 01/07/2023,8:41 PM

## 2023-01-08 ENCOUNTER — Inpatient Hospital Stay: Payer: 59 | Admitting: Certified Registered"

## 2023-01-08 ENCOUNTER — Inpatient Hospital Stay: Payer: 59

## 2023-01-08 ENCOUNTER — Other Ambulatory Visit: Payer: Self-pay

## 2023-01-08 ENCOUNTER — Encounter
Admission: EM | Disposition: A | Discharge status: Skilled Nursing Facility | Payer: Self-pay | Source: Home / Self Care | Attending: Internal Medicine

## 2023-01-08 DIAGNOSIS — I214 Non-ST elevation (NSTEMI) myocardial infarction: Secondary | ICD-10-CM | POA: Diagnosis not present

## 2023-01-08 DIAGNOSIS — W19XXXD Unspecified fall, subsequent encounter: Secondary | ICD-10-CM | POA: Diagnosis not present

## 2023-01-08 DIAGNOSIS — H409 Unspecified glaucoma: Secondary | ICD-10-CM | POA: Diagnosis not present

## 2023-01-08 DIAGNOSIS — R55 Syncope and collapse: Secondary | ICD-10-CM

## 2023-01-08 HISTORY — PX: CORONARY STENT INTERVENTION: CATH118234

## 2023-01-08 HISTORY — PX: LEFT HEART CATH AND CORONARY ANGIOGRAPHY: CATH118249

## 2023-01-08 LAB — BASIC METABOLIC PANEL
Anion gap: 9 (ref 5–15)
BUN: 16 mg/dL (ref 8–23)
CO2: 24 mmol/L (ref 22–32)
Calcium: 8.5 mg/dL — ABNORMAL LOW (ref 8.9–10.3)
Chloride: 101 mmol/L (ref 98–111)
Creatinine, Ser: 0.89 mg/dL (ref 0.44–1.00)
GFR, Estimated: >60 mL/min (ref >60)
Glucose, Bld: 147 mg/dL — ABNORMAL HIGH (ref 70–99)
Potassium: 4.1 mmol/L (ref 3.5–5.1)
Sodium: 134 mmol/L — ABNORMAL LOW (ref 135–145)

## 2023-01-08 LAB — GLUCOSE, CAPILLARY
Glucose-Capillary: 129 mg/dL — ABNORMAL HIGH (ref 70–99)
Glucose-Capillary: 121 mg/dL — ABNORMAL HIGH (ref 70–99)
Glucose-Capillary: 140 mg/dL — ABNORMAL HIGH (ref 70–99)

## 2023-01-08 LAB — PROTIME-INR
INR: 1 (ref 0.8–1.2)
Prothrombin Time: 13.1 s (ref 11.4–15.2)

## 2023-01-08 LAB — TROPONIN I (HIGH SENSITIVITY)
Troponin I (High Sensitivity): 646 ng/L (ref <18)
Troponin I (High Sensitivity): 788 ng/L (ref <18)
Troponin I (High Sensitivity): 915 ng/L (ref <18)

## 2023-01-08 LAB — HIV ANTIBODY (ROUTINE TESTING W REFLEX): HIV Screen 4th Generation wRfx: NONREACTIVE

## 2023-01-08 LAB — CBC
HCT: 33.2 % — ABNORMAL LOW (ref 36.0–46.0)
Hemoglobin: 10.6 g/dL — ABNORMAL LOW (ref 12.0–15.0)
MCH: 26.8 pg (ref 26.0–34.0)
MCHC: 31.9 g/dL (ref 30.0–36.0)
MCV: 83.8 fL (ref 80.0–100.0)
Platelets: 199 10*3/uL (ref 150–400)
RBC: 3.96 MIL/uL (ref 3.87–5.11)
RDW: 14.4 % (ref 11.5–15.5)
WBC: 6.3 10*3/uL (ref 4.0–10.5)
nRBC: 0 % (ref 0.0–0.2)

## 2023-01-08 LAB — LIPID PANEL
Cholesterol: 167 mg/dL (ref 0–200)
HDL: 31 mg/dL — ABNORMAL LOW (ref >40)
LDL Cholesterol: UNDETERMINED mg/dL (ref 0–99)
Total CHOL/HDL Ratio: 5.4 {ratio}
Triglycerides: 561 mg/dL — ABNORMAL HIGH (ref <150)
VLDL: UNDETERMINED mg/dL (ref 0–40)

## 2023-01-08 LAB — CBG MONITORING, ED
Glucose-Capillary: 132 mg/dL — ABNORMAL HIGH (ref 70–99)
Glucose-Capillary: 124 mg/dL — ABNORMAL HIGH (ref 70–99)
Glucose-Capillary: 140 mg/dL — ABNORMAL HIGH (ref 70–99)

## 2023-01-08 LAB — POCT ACTIVATED CLOTTING TIME: Activated Clotting Time: 343 s

## 2023-01-08 LAB — HEMOGLOBIN A1C
Hgb A1c MFr Bld: 6.7 % — ABNORMAL HIGH (ref 4.8–5.6)
Mean Plasma Glucose: 145.59 mg/dL

## 2023-01-08 LAB — HEPARIN LEVEL (UNFRACTIONATED)
Heparin Unfractionated: <0.1 [IU]/mL — ABNORMAL LOW (ref 0.30–0.70)
Heparin Unfractionated: 0.1 [IU]/mL — ABNORMAL LOW (ref 0.30–0.70)

## 2023-01-08 LAB — T4, FREE: Free T4: 0.73 ng/dL (ref 0.61–1.12)

## 2023-01-08 LAB — TSH: TSH: 7.022 u[IU]/mL — ABNORMAL HIGH (ref 0.350–4.500)

## 2023-01-08 LAB — LACTIC ACID, PLASMA: Lactic Acid, Venous: 1.7 mmol/L (ref 0.5–1.9)

## 2023-01-08 LAB — APTT: aPTT: 32 s (ref 24–36)

## 2023-01-08 SURGERY — LEFT HEART CATH AND CORONARY ANGIOGRAPHY
Anesthesia: Moderate Sedation

## 2023-01-08 MED ORDER — MIDAZOLAM HCL 2 MG/2ML IJ SOLN
INTRAMUSCULAR | Status: AC
Start: 2023-01-08 — End: ?
  Filled 2023-01-08: qty 2

## 2023-01-08 MED ORDER — MIDAZOLAM HCL 2 MG/2ML IJ SOLN
INTRAMUSCULAR | Status: DC | PRN
  Administered 2023-01-08: 1 mg via INTRAVENOUS

## 2023-01-08 MED ORDER — SODIUM CHLORIDE 0.9% FLUSH
3.0000 mL | INTRAVENOUS | Status: DC | PRN
Start: 2023-01-08 — End: 2023-01-08

## 2023-01-08 MED ORDER — IOHEXOL 300 MG/ML  SOLN
INTRAMUSCULAR | Status: DC | PRN
  Administered 2023-01-08: 116 mL

## 2023-01-08 MED ORDER — SODIUM CHLORIDE 0.9 % IV SOLN: 250.0000 mL | INTRAVENOUS | Status: DC | PRN

## 2023-01-08 MED ORDER — FENTANYL CITRATE (PF) 100 MCG/2ML IJ SOLN
INTRAMUSCULAR | Status: AC
  Filled 2023-01-08: qty 2

## 2023-01-08 MED ORDER — HYDRALAZINE HCL 20 MG/ML IJ SOLN: 10.0000 mg | INTRAMUSCULAR | Status: AC | PRN

## 2023-01-08 MED ORDER — SODIUM CHLORIDE 0.9% FLUSH: 3.0000 mL | Freq: Two times a day (BID) | INTRAVENOUS | Status: DC

## 2023-01-08 MED ORDER — CLOPIDOGREL BISULFATE 75 MG PO TABS
ORAL_TABLET | ORAL | Status: DC | PRN
  Administered 2023-01-08: 300 mg via ORAL

## 2023-01-08 MED ORDER — FENTANYL CITRATE (PF) 100 MCG/2ML IJ SOLN
INTRAMUSCULAR | Status: DC | PRN
  Administered 2023-01-08: 25 ug via INTRAVENOUS

## 2023-01-08 MED ORDER — ASPIRIN 81 MG PO CHEW
CHEWABLE_TABLET | ORAL | Status: AC
Start: 2023-01-08 — End: ?
  Filled 2023-01-08: qty 3

## 2023-01-08 MED ORDER — LIDOCAINE HCL 1 % IJ SOLN
INTRAMUSCULAR | Status: AC
  Filled 2023-01-08: qty 20

## 2023-01-08 MED ORDER — IOHEXOL 300 MG/ML  SOLN
INTRAMUSCULAR | Status: DC | PRN
  Administered 2023-01-08: 98 mL

## 2023-01-08 MED ORDER — SODIUM CHLORIDE 0.9% FLUSH: 3.0000 mL | INTRAVENOUS | Status: DC | PRN

## 2023-01-08 MED ORDER — OXYCODONE-ACETAMINOPHEN 5-325 MG PO TABS
1.0000 | ORAL_TABLET | Freq: Once | ORAL | Status: AC
  Administered 2023-01-08: 1 via ORAL
  Filled 2023-01-08: qty 1

## 2023-01-08 MED ORDER — HEPARIN BOLUS VIA INFUSION
1800.0000 [IU] | Freq: Once | INTRAVENOUS | Status: AC
  Administered 2023-01-08: 1800 [IU] via INTRAVENOUS
  Filled 2023-01-08: qty 1800

## 2023-01-08 MED ORDER — BIVALIRUDIN TRIFLUOROACETATE 250 MG IV SOLR
INTRAVENOUS | Status: AC
  Filled 2023-01-08: qty 250

## 2023-01-08 MED ORDER — OXYCODONE-ACETAMINOPHEN 5-325 MG PO TABS
2.0000 | ORAL_TABLET | Freq: Four times a day (QID) | ORAL | Status: DC | PRN
  Administered 2023-01-08 – 2023-01-13 (×15): 2 via ORAL
  Filled 2023-01-08 (×16): qty 2

## 2023-01-08 MED ORDER — ASPIRIN 81 MG PO CHEW
81.0000 mg | CHEWABLE_TABLET | Freq: Every day | ORAL | Status: DC
  Filled 2023-01-08: qty 1

## 2023-01-08 MED ORDER — LIDOCAINE HCL (PF) 1 % IJ SOLN
INTRAMUSCULAR | Status: DC | PRN
  Administered 2023-01-08: 20 mL

## 2023-01-08 MED ORDER — BIVALIRUDIN BOLUS VIA INFUSION - CUPID
INTRAVENOUS | Status: DC | PRN
  Administered 2023-01-08: 61.2 mg via INTRAVENOUS

## 2023-01-08 MED ORDER — SODIUM CHLORIDE 0.9 % WEIGHT BASED INFUSION: 1.0000 mL/kg/h | INTRAVENOUS | Status: DC

## 2023-01-08 MED ORDER — SODIUM CHLORIDE 0.9 % WEIGHT BASED INFUSION
3.0000 mL/kg/h | INTRAVENOUS | Status: AC
Start: 2023-01-08 — End: 2023-01-08

## 2023-01-08 MED ORDER — CLOPIDOGREL BISULFATE 75 MG PO TABS
ORAL_TABLET | ORAL | Status: AC
Start: 2023-01-08 — End: ?
  Filled 2023-01-08: qty 4

## 2023-01-08 MED ORDER — ASPIRIN 81 MG PO CHEW
CHEWABLE_TABLET | ORAL | Status: DC | PRN
  Administered 2023-01-08: 243 mg via ORAL

## 2023-01-08 MED ORDER — CLOPIDOGREL BISULFATE 75 MG PO TABS
75.0000 mg | ORAL_TABLET | Freq: Every day | ORAL | Status: DC
  Administered 2023-01-09 – 2023-01-14 (×6): 75 mg via ORAL
  Filled 2023-01-08 (×6): qty 1

## 2023-01-08 MED ORDER — INSULIN ASPART 100 UNIT/ML IJ SOLN: 0.0000 [IU] | Freq: Every day | INTRAMUSCULAR | Status: DC

## 2023-01-08 MED ORDER — ASPIRIN 81 MG PO CHEW: 81.0000 mg | CHEWABLE_TABLET | ORAL | Status: DC

## 2023-01-08 MED ORDER — METOPROLOL TARTRATE 5 MG/5ML IV SOLN
INTRAVENOUS | Status: AC
Start: 2023-01-08 — End: ?
  Filled 2023-01-08: qty 5

## 2023-01-08 MED ORDER — INSULIN ASPART 100 UNIT/ML IJ SOLN
0.0000 [IU] | Freq: Three times a day (TID) | INTRAMUSCULAR | Status: DC
  Administered 2023-01-09: 2 [IU] via SUBCUTANEOUS
  Administered 2023-01-09: 3 [IU] via SUBCUTANEOUS
  Administered 2023-01-09 – 2023-01-10 (×2): 2 [IU] via SUBCUTANEOUS
  Administered 2023-01-10: 5 [IU] via SUBCUTANEOUS
  Administered 2023-01-10: 3 [IU] via SUBCUTANEOUS
  Administered 2023-01-11: 2 [IU] via SUBCUTANEOUS
  Administered 2023-01-11 – 2023-01-13 (×6): 3 [IU] via SUBCUTANEOUS
  Administered 2023-01-13: 2 [IU] via SUBCUTANEOUS
  Filled 2023-01-08 (×14): qty 1

## 2023-01-08 MED ORDER — LABETALOL HCL 5 MG/ML IV SOLN: 10.0000 mg | INTRAVENOUS | Status: AC | PRN

## 2023-01-08 MED ORDER — METOPROLOL TARTRATE 5 MG/5ML IV SOLN
INTRAVENOUS | Status: DC | PRN
  Administered 2023-01-08 (×2): 2.5 mg via INTRAVENOUS

## 2023-01-08 MED ORDER — HEPARIN (PORCINE) IN NACL 1000-0.9 UT/500ML-% IV SOLN
INTRAVENOUS | Status: DC | PRN
  Administered 2023-01-08 (×2): 500 mL

## 2023-01-08 MED ORDER — SODIUM CHLORIDE 0.9 % IV SOLN
INTRAVENOUS | Status: DC | PRN
  Administered 2023-01-08: 1.75 mg/kg/h via INTRAVENOUS

## 2023-01-08 SURGICAL SUPPLY — 20 items
BALLN TREK RX 2.25X12 (BALLOONS) ×1
BALLOON TREK RX 2.25X12 (BALLOONS) IMPLANT
CATH INFINITI 5FR ANG PIGTAIL (CATHETERS) IMPLANT
CATH INFINITI 5FR MULTPACK ANG (CATHETERS) IMPLANT
CATH VISTA GUIDE 6FR XB3 (CATHETERS) IMPLANT
DEVICE CLOSURE MYNXGRIP 6/7F (Vascular Products) IMPLANT
KIT ENCORE 26 ADVANTAGE (KITS) IMPLANT
NDL PERC 18GX7CM (NEEDLE) IMPLANT
NEEDLE PERC 18GX7CM (NEEDLE) ×1 IMPLANT
PACK CARDIAC CATH (CUSTOM PROCEDURE TRAY) ×1 IMPLANT
PANNUS RETENTION SYSTEM 2 PAD (MISCELLANEOUS) IMPLANT
PROTECTION STATION PRESSURIZED (MISCELLANEOUS) ×1
SET ATX-X65L (MISCELLANEOUS) IMPLANT
SHEATH AVANTI 5FR X 11CM (SHEATH) IMPLANT
SHEATH AVANTI 6FR X 11CM (SHEATH) IMPLANT
STATION PROTECTION PRESSURIZED (MISCELLANEOUS) IMPLANT
STENT ONYX FRONTIER 2.25X12 (Permanent Stent) IMPLANT
TUBING CIL FLEX 10 FLL-RA (TUBING) IMPLANT
WIRE G HI TQ BMW 190 (WIRE) IMPLANT
WIRE GUIDERIGHT .035X150 (WIRE) IMPLANT

## 2023-01-08 NOTE — ED Notes (Signed)
Cardiology at bedside.

## 2023-01-08 NOTE — Consult Note (Signed)
Yvette Riley is a 67 y.o. female  [id number]  Primary Cardiologist: Adrian Blackwater Reason for Consultation: Non-STEMI  HPI: This is a 67 year old white female with been repeatedly having chest pain and repeated dizziness with an falling out presented to the emergency room with blood test showing elevated troponin.  I was asked to evaluate the patient as patient keeps falling and feeling dizzy and lightheaded.  She also had repeated episodes of chest pain.   Review of Systems: No syncope   Past Medical History:  Diagnosis Date   Allergy    Anemia    Anxiety    Arthritis    Asthma    Blood transfusion without reported diagnosis    C. difficile diarrhea 04/07/2016   CAP (community acquired pneumonia) 03/21/2015   Combined forms of age-related cataract of both eyes 05/06/2016   COPD (chronic obstructive pulmonary disease) (HCC)    CVA (cerebral vascular accident) (HCC) 06/17/2018   Diabetes mellitus without complication (HCC)    Displacement of lumbar intervertebral disc without myelopathy    Emphysema of lung (HCC)    GERD (gastroesophageal reflux disease)    Glaucoma    History of chicken pox    Hyperlipidemia    Hypertension    NSTEMI (non-ST elevated myocardial infarction) (HCC) 01/07/2023   Pneumonia 03/29/2015   Sepsis (HCC) 03/17/2016   Shortness of breath    Small cell lung cancer (HCC)     (Not in a hospital admission)     albuterol  2.5 mg Nebulization Q6H   aspirin EC  81 mg Oral Daily   busPIRone  5 mg Oral TID   clopidogrel  75 mg Oral Daily   fluticasone furoate-vilanterol  1 puff Inhalation Daily   And   umeclidinium bromide  1 puff Inhalation Daily   levothyroxine  50 mcg Oral Q0600   metoprolol succinate  50 mg Oral Daily   sertraline  50 mg Oral Daily   sodium chloride flush  3 mL Intravenous Q12H    Infusions:  sodium chloride     heparin 950 Units/hr (01/08/23 0424)    Allergies  Allergen Reactions   Other Anaphylaxis   Penicillins  Anaphylaxis, Rash and Other (See Comments)    Reaction:  Tongue swelling  Has patient had a PCN reaction causing immediate rash, facial/tongue/throat swelling, SOB or lightheadedness with hypotension:  Yes   Has patient had a PCN reaction causing severe rash involving mucus membranes or skin necrosis: No Has patient had a PCN reaction that required hospitalization No Has patient had a PCN reaction occurring within the last 10 years: No If all of the above answers are "NO", then may proceed with Cephalosporin use.   Yellow Jacket Venom [Bee Venom] Anaphylaxis   Codeine Hives   Crestor [Rosuvastatin] Nausea And Vomiting    Corrected prior adverse reaction per [facility] [employer]. On 12/02/3011  patient reported N & V when she takes Crestor   Gemfibrozil Rash, Nausea And Vomiting and Swelling   Trazodone And Nefazodone Nausea And Vomiting   Lipitor [Atorvastatin] Nausea Only    By patient report 12/02/2011. Had also been prescribed pravastatin and lovastatin by previous MD. Unclear if those caused same side effects.     Social History   Socioeconomic History   Marital status: Widowed    Spouse name: Not on file   Number of children: 2   Years of education: Not on file   Highest education level: 10th grade  Occupational History  Occupation: disable  Tobacco Use   Smoking status: Former    Current packs/day: 0.00    Average packs/day: 0.3 packs/day for 45.0 years (11.3 ttl pk-yrs)    Types: Cigarettes    Start date: 03/18/1971    Quit date: 03/17/2016    Years since quitting: 6.8   Smokeless tobacco: Never   Tobacco comments:    pt states she quit in 2016-2017  Vaping Use   Vaping status: Never Used  Substance and Sexual Activity   Alcohol use: No    Alcohol/week: 0.0 standard drinks of alcohol   Drug use: No   Sexual activity: Not Currently    Birth control/protection: Abstinence  Other Topics Concern   Not on file  Social History Narrative   Not on file   Social  Determinants of Health   Financial Resource Strain: Low Risk  (09/05/2021)   Overall Financial Resource Strain (CARDIA)    Difficulty of Paying Living Expenses: Not hard at all  Food Insecurity: No Food Insecurity (02/08/2022)   Hunger Vital Sign    Worried About Running Out of Food in the Last Year: Never true    Ran Out of Food in the Last Year: Never true  Transportation Needs: No Transportation Needs (02/08/2022)   PRAPARE - Administrator, Civil Service (Medical): No    Lack of Transportation (Non-Medical): No  Physical Activity: Inactive (09/05/2021)   Exercise Vital Sign    Days of Exercise per Week: 0 days    Minutes of Exercise per Session: 0 min  Stress: Stress Concern Present (09/05/2021)   [employer] of Occupational Health - Occupational Stress Questionnaire    Feeling of Stress : Very much  Social Connections: Unknown (06/01/2021)   Received from [facility]   Social Network    Social Network: Not on file  Intimate Partner Violence: Not At Risk (02/08/2022)   Humiliation, Afraid, Rape, and Kick questionnaire    Fear of Current or Ex-Partner: No    Emotionally Abused: No    Physically Abused: No    Sexually Abused: No    Family History  Problem Relation Age of Onset   Heart failure Mother    Heart disease Mother    Stroke Mother    Heart disease Brother    COPD Brother    Cancer Maternal Aunt        Breast Cancer    PHYSICAL EXAM: Vitals:   01/08/23 0730 01/08/23 0830  BP: 124/87 135/73  Pulse: 100 98  Resp:    Temp:    SpO2: 96% 95%    No intake or output data in the 24 hours ending 01/08/23 0935  General:  Well appearing. No respiratory difficulty HEENT: normal Neck: supple. no JVD. Carotids 2+ bilat; no bruits. No lymphadenopathy or thryomegaly appreciated. Cor: PMI nondisplaced. Regular rate & rhythm. No rubs, gallops or murmurs. Lungs: clear Abdomen: soft, nontender, nondistended. No hepatosplenomegaly. No bruits or  masses. Good bowel sounds. Extremities: no cyanosis, clubbing, rash, edema Neuro: alert & oriented x 3, cranial nerves grossly intact. moves all 4 extremities w/o difficulty. Affect pleasant.  ECG: Normal sinus rhythm nonspecific ST-T changes and right bundle branch block heart rate 97  Results for orders placed or performed during the hospital encounter of 01/07/23 (from the past 24 hour(s))  Urinalysis, Routine w reflex microscopic -Urine, Clean Catch     Status: Abnormal   Collection Time: 01/07/23  1:58 PM  Result Value Ref Range  Color, Urine YELLOW (A) YELLOW   APPearance CLOUDY (A) CLEAR   Specific Gravity, Urine 1.026 1.005 - 1.030   pH 5.0 5.0 - 8.0   Glucose, UA NEGATIVE NEGATIVE mg/dL   Hgb urine dipstick NEGATIVE NEGATIVE   Bilirubin Urine NEGATIVE NEGATIVE   Ketones, ur NEGATIVE NEGATIVE mg/dL   Protein, ur NEGATIVE NEGATIVE mg/dL   Nitrite NEGATIVE NEGATIVE   Leukocytes,Ua SMALL (A) NEGATIVE   RBC / HPF 0-5 0 - 5 RBC/hpf   WBC, UA 0-5 0 - 5 WBC/hpf   Bacteria, UA NONE SEEN NONE SEEN   Squamous Epithelial / HPF 0-5 0 - 5 /HPF   Mucus PRESENT   Hemoglobin A1c     Status: Abnormal   Collection Time: 01/07/23  2:24 PM  Result Value Ref Range   Hgb A1c MFr Bld 6.7 (H) 4.8 - 5.6 %   Mean Plasma Glucose 145.59 mg/dL  Basic metabolic panel     Status: Abnormal   Collection Time: 01/07/23  2:27 PM  Result Value Ref Range   Sodium 134 (L) 135 - 145 mmol/L   Potassium 4.0 3.5 - 5.1 mmol/L   Chloride 100 98 - 111 mmol/L   CO2 21 (L) 22 - 32 mmol/L   Glucose, Bld 137 (H) 70 - 99 mg/dL   BUN 14 8 - 23 mg/dL   Creatinine, Ser 2.99 0.44 - 1.00 mg/dL   Calcium 8.6 (L) 8.9 - 10.3 mg/dL   GFR, Estimated >37 >16 mL/min   Anion gap 13 5 - 15  CBC     Status: Abnormal   Collection Time: 01/07/23  2:27 PM  Result Value Ref Range   WBC 7.7 4.0 - 10.5 K/uL   RBC 4.08 3.87 - 5.11 MIL/uL   Hemoglobin 11.1 (L) 12.0 - 15.0 g/dL   HCT 96.7 (L) 89.3 - 81.0 %   MCV 85.3 80.0 - 100.0  fL   MCH 27.2 26.0 - 34.0 pg   MCHC 31.9 30.0 - 36.0 g/dL   RDW 17.5 10.2 - 58.5 %   Platelets 227 150 - 400 K/uL   nRBC 0.0 0.0 - 0.2 %  Troponin I (High Sensitivity)     Status: Abnormal   Collection Time: 01/07/23  2:27 PM  Result Value Ref Range   Troponin I (High Sensitivity) 184 (HH) <18 ng/L  Troponin I (High Sensitivity)     Status: Abnormal   Collection Time: 01/07/23  6:32 PM  Result Value Ref Range   Troponin I (High Sensitivity) 385 (HH) <18 ng/L  CK     Status: None   Collection Time: 01/07/23  6:32 PM  Result Value Ref Range   Total CK 163 38 - 234 U/L  Hepatic function panel     Status: None   Collection Time: 01/07/23  6:32 PM  Result Value Ref Range   Total Protein 7.3 6.5 - 8.1 g/dL   Albumin 3.9 3.5 - 5.0 g/dL   AST 29 15 - 41 U/L   ALT 16 0 - 44 U/L   Alkaline Phosphatase 80 38 - 126 U/L   Total Bilirubin 0.6 <1.2 mg/dL   Bilirubin, Direct 0.1 0.0 - 0.2 mg/dL   Indirect Bilirubin 0.5 0.3 - 0.9 mg/dL  Troponin I (High Sensitivity)     Status: Abnormal   Collection Time: 01/08/23  1:34 AM  Result Value Ref Range   Troponin I (High Sensitivity) 646 (HH) <18 ng/L  APTT     Status: None  Collection Time: 01/08/23  1:34 AM  Result Value Ref Range   aPTT 32 24 - 36 seconds  Protime-INR     Status: None   Collection Time: 01/08/23  1:34 AM  Result Value Ref Range   Prothrombin Time 13.1 11.4 - 15.2 seconds   INR 1.0 0.8 - 1.2  Lactic acid, plasma     Status: None   Collection Time: 01/08/23  1:34 AM  Result Value Ref Range   Lactic Acid, Venous 1.7 0.5 - 1.9 mmol/L  TSH     Status: Abnormal   Collection Time: 01/08/23  1:34 AM  Result Value Ref Range   TSH 7.022 (H) 0.350 - 4.500 uIU/mL  T4, free     Status: None   Collection Time: 01/08/23  1:34 AM  Result Value Ref Range   Free T4 0.73 0.61 - 1.12 ng/dL  CBC     Status: Abnormal   Collection Time: 01/08/23  3:28 AM  Result Value Ref Range   WBC 6.3 4.0 - 10.5 K/uL   RBC 3.96 3.87 - 5.11 MIL/uL    Hemoglobin 10.6 (L) 12.0 - 15.0 g/dL   HCT 16.1 (L) 09.6 - 04.5 %   MCV 83.8 80.0 - 100.0 fL   MCH 26.8 26.0 - 34.0 pg   MCHC 31.9 30.0 - 36.0 g/dL   RDW 40.9 81.1 - 91.4 %   Platelets 199 150 - 400 K/uL   nRBC 0.0 0.0 - 0.2 %  Lipid panel     Status: Abnormal   Collection Time: 01/08/23  3:28 AM  Result Value Ref Range   Cholesterol 167 0 - 200 mg/dL   Triglycerides 782 (H) <150 mg/dL   HDL 31 (L) >95 mg/dL   Total CHOL/HDL Ratio 5.4 RATIO   VLDL UNABLE TO CALCULATE IF TRIGLYCERIDE OVER 400 mg/dL 0 - 40 mg/dL   LDL Cholesterol UNABLE TO CALCULATE IF TRIGLYCERIDE OVER 400 mg/dL 0 - 99 mg/dL  Basic metabolic panel     Status: Abnormal   Collection Time: 01/08/23  3:28 AM  Result Value Ref Range   Sodium 134 (L) 135 - 145 mmol/L   Potassium 4.1 3.5 - 5.1 mmol/L   Chloride 101 98 - 111 mmol/L   CO2 24 22 - 32 mmol/L   Glucose, Bld 147 (H) 70 - 99 mg/dL   BUN 16 8 - 23 mg/dL   Creatinine, Ser 6.21 0.44 - 1.00 mg/dL   Calcium 8.5 (L) 8.9 - 10.3 mg/dL   GFR, Estimated >30 >86 mL/min   Anion gap 9 5 - 15  Heparin level (unfractionated)     Status: Abnormal   Collection Time: 01/08/23  3:28 AM  Result Value Ref Range   Heparin Unfractionated <0.10 (L) 0.30 - 0.70 IU/mL  CBG monitoring, ED     Status: Abnormal   Collection Time: 01/08/23  3:32 AM  Result Value Ref Range   Glucose-Capillary 132 (H) 70 - 99 mg/dL  CBG monitoring, ED     Status: Abnormal   Collection Time: 01/08/23  7:29 AM  Result Value Ref Range   Glucose-Capillary 124 (H) 70 - 99 mg/dL   CT Angio Chest Pulmonary Embolism (PE) W or WO Contrast  Result Date: 01/07/2023 CLINICAL DATA:  Recent syncopal episode, history of lung carcinoma, initial encounter EXAM: CT ANGIOGRAPHY CHEST WITH CONTRAST TECHNIQUE: Multidetector CT imaging of the chest was performed using the standard protocol during bolus administration of intravenous contrast. Multiplanar CT image reconstructions and MIPs were  obtained to evaluate the  vascular anatomy. RADIATION DOSE REDUCTION: This exam was performed according to the departmental dose-optimization program which includes automated exposure control, adjustment of the mA and/or kV according to patient size and/or use of iterative reconstruction technique. CONTRAST:  75mL OMNIPAQUE IOHEXOL 350 MG/ML SOLN COMPARISON:  12/30/2022 FINDINGS: Cardiovascular: Thoracic aorta shows mild atherosclerotic calcifications. No aneurysmal dilatation or dissection is noted. No cardiac enlargement is seen. Coronary calcifications are noted. The pulmonary artery shows a normal branching pattern bilaterally. No filling defect to suggest pulmonary embolism is seen. Mediastinum/Nodes: Thoracic inlet is within normal limits. No hilar or mediastinal adenopathy is noted. Stable distal paraesophageal node is noted measuring 8 mm in short axis. Lungs/Pleura: Lungs are well aerated bilaterally. Diffuse emphysematous changes are seen. No acute infiltrate is seen. Post radiation changes are noted in the medial aspect of the right upper and lower lobes stable from the prior study. No sizable effusion is seen. No parenchymal nodules are noted. Upper Abdomen: Visualized upper abdomen shows e acute abnormality. Musculoskeletal: Mild degenerative changes of the thoracic spine are noted. No acute rib abnormality is seen. Review of the MIP images confirms the above findings. IMPRESSION: No evidence of pulmonary emboli. Post radiation changes in the medial right hemithorax. These are stable from the prior exam. Stable distal paraesophageal lymph node. Aortic Atherosclerosis (ICD10-I70.0). Electronically Signed   By: Alcide Clever M.D.   On: 01/07/2023 20:55   MR BRAIN WO CONTRAST  Result Date: 01/07/2023 CLINICAL DATA:  Neuro deficit with acute stroke suspected. EXAM: MRI HEAD WITHOUT CONTRAST TECHNIQUE: Multiplanar, multiecho pulse sequences of the brain and surrounding structures were obtained without intravenous contrast.  COMPARISON:  Head CT 01/04/2023 FINDINGS: Brain: No acute infarction, hemorrhage, hydrocephalus, extra-axial collection or mass lesion. Extensive chronic infarction in the right cerebellum and to a lesser extent in the right occipital lobe. Ischemic gliosis in the periventricular white matter is mild for age. Mild cerebral volume loss, more pronounced volume loss in the posterior fossa, stable from a 2022 brain MRI. Vascular: Major flow voids are stable, including diminutive right vertebral artery Skull and upper cervical spine: Normal marrow signal Sinuses/Orbits: Unremarkable IMPRESSION: 1. No acute finding. 2. Chronic right cerebellar and right occipital infarcts. Electronically Signed   By: Tiburcio Pea M.D.   On: 01/07/2023 19:43   DG Hip Unilat W or Wo Pelvis 2-3 Views Left  Result Date: 01/07/2023 CLINICAL DATA:  Fall and left hip pain. EXAM: DG HIP (WITH OR WITHOUT PELVIS) 2-3V LEFT COMPARISON:  None Available. FINDINGS: There is no acute fracture or dislocation. The bones are osteopenic. Mild bilateral hip arthritic changes. The soft tissues are unremarkable. IMPRESSION: 1. No acute fracture or dislocation. 2. Osteopenia. Electronically Signed   By: Elgie Collard M.D.   On: 01/07/2023 15:42     ASSESSMENT AND PLAN: Non-STEMI with elevated troponin with gradually going up associated with presyncope and falling out and chest pain.  Patient was explained risk and benefits patient has agreed to left heart catheterization which will be scheduled 1030 this morning.  Ebunoluwa Gernert Welton Flakes

## 2023-01-08 NOTE — Progress Notes (Signed)
PHARMACY - ANTICOAGULATION CONSULT NOTE  Pharmacy Consult for heparin drip  Indication: chest pain/ACS  Allergies  Allergen Reactions   Other Anaphylaxis   Penicillins Anaphylaxis, Rash and Other (See Comments)    Reaction:  Tongue swelling  Has patient had a PCN reaction causing immediate rash, facial/tongue/throat swelling, SOB or lightheadedness with hypotension:  Yes   Has patient had a PCN reaction causing severe rash involving mucus membranes or skin necrosis: No Has patient had a PCN reaction that required hospitalization No Has patient had a PCN reaction occurring within the last 10 years: No If all of the above answers are "NO", then may proceed with Cephalosporin use.   Yellow Jacket Venom [Bee Venom] Anaphylaxis   Codeine Hives   Crestor [Rosuvastatin] Nausea And Vomiting    Corrected prior adverse reaction per [facility] [employer]. On 12/02/3011  patient reported N & V when she takes Crestor   Gemfibrozil Rash, Nausea And Vomiting and Swelling   Trazodone And Nefazodone Nausea And Vomiting   Lipitor [Atorvastatin] Nausea Only    By patient report 12/02/2011. Had also been prescribed pravastatin and lovastatin by previous MD. Unclear if those caused same side effects.     Patient Measurements: Height: 4\' 10"  (147.3 cm) Weight: 81.6 kg (180 lb) IBW/kg (Calculated) : 40.9 Heparin Dosing Weight: 60 kg  Vital Signs: Temp: 98.6 F (37 C) (12/11 0330) BP: 129/83 (12/11 0330) Pulse Rate: 94 (12/11 0330)  Labs: Recent Labs    01/07/23 1427 01/07/23 1832 01/08/23 0134 01/08/23 0328  HGB 11.1*  --   --  10.6*  HCT 34.8*  --   --  33.2*  PLT 227  --   --  199  APTT  --   --  32  --   LABPROT  --   --  13.1  --   INR  --   --  1.0  --   HEPARINUNFRC  --   --   --  <0.10*  CREATININE 0.92  --   --  0.89  CKTOTAL  --  163  --   --   TROPONINIHS 184* 385* 646*  --     Estimated Creatinine Clearance: 56.1 mL/min (by C-G formula based on SCr of 0.89 mg/dL).   Medical  History: Past Medical History:  Diagnosis Date   Allergy    Anemia    Anxiety    Arthritis    Asthma    Blood transfusion without reported diagnosis    C. difficile diarrhea 04/07/2016   CAP (community acquired pneumonia) 03/21/2015   Combined forms of age-related cataract of both eyes 05/06/2016   COPD (chronic obstructive pulmonary disease) (HCC)    CVA (cerebral vascular accident) (HCC) 06/17/2018   Diabetes mellitus without complication (HCC)    Displacement of lumbar intervertebral disc without myelopathy    Emphysema of lung (HCC)    GERD (gastroesophageal reflux disease)    Glaucoma    History of chicken pox    Hyperlipidemia    Hypertension    NSTEMI (non-ST elevated myocardial infarction) (HCC) 01/07/2023   Pneumonia 03/29/2015   Sepsis (HCC) 03/17/2016   Shortness of breath    Small cell lung cancer (HCC)     Medications:  (Not in a hospital admission)  Scheduled:   albuterol  2.5 mg Nebulization Q6H   aspirin EC  81 mg Oral Daily   busPIRone  5 mg Oral TID   clopidogrel  75 mg Oral Daily   fluticasone furoate-vilanterol  1 puff Inhalation Daily   And   umeclidinium bromide  1 puff Inhalation Daily   levothyroxine  50 mcg Oral Q0600   metoprolol succinate  50 mg Oral Daily   sertraline  50 mg Oral Daily   sodium chloride flush  3 mL Intravenous Q12H   Infusions:   sodium chloride     heparin 750 Units/hr (01/07/23 2029)    Assessment: 67 yo F to start heparin drip for ACS/STEMI. On plavix PTA  Hgb 11.1  plt 227  aPTT pending   INR pending  Goal of Therapy:  Heparin level 0.3-0.7 units/ml Monitor platelets by anticoagulation protocol: Yes   12/11 0328 HL <0.10, subtherapeutic  Plan:  Bolus 1600 units x 1 Increase heparin infusion to 950 units/hr Recheck HL in 6 hr after rate change CBC daily while on heparin.  Otelia Sergeant, PharmD, MBA 01/08/2023 4:20 AM

## 2023-01-08 NOTE — ED Notes (Signed)
Pt reports 10/10 pain to back and head. No prn orders for pain score greater than 3 ordered. Will notify MD

## 2023-01-08 NOTE — Progress Notes (Addendum)
PHARMACY - ANTICOAGULATION CONSULT NOTE  Pharmacy Consult for heparin drip  Indication: chest pain/ACS  Allergies  Allergen Reactions   Other Anaphylaxis   Penicillins Anaphylaxis, Rash and Other (See Comments)    Reaction:  Tongue swelling  Has patient had a PCN reaction causing immediate rash, facial/tongue/throat swelling, SOB or lightheadedness with hypotension:  Yes   Has patient had a PCN reaction causing severe rash involving mucus membranes or skin necrosis: No Has patient had a PCN reaction that required hospitalization No Has patient had a PCN reaction occurring within the last 10 years: No If all of the above answers are "NO", then may proceed with Cephalosporin use.   Yellow Jacket Venom [Bee Venom] Anaphylaxis   Codeine Hives   Crestor [Rosuvastatin] Nausea And Vomiting    Corrected prior adverse reaction per [facility] [employer]. On 12/02/3011  patient reported N & V when she takes Crestor   Gemfibrozil Rash, Nausea And Vomiting and Swelling   Trazodone And Nefazodone Nausea And Vomiting   Lipitor [Atorvastatin] Nausea Only    By patient report 12/02/2011. Had also been prescribed pravastatin and lovastatin by previous MD. Unclear if those caused same side effects.     Patient Measurements: Height: 4\' 10"  (147.3 cm) Weight: 81.6 kg (180 lb) IBW/kg (Calculated) : 40.9 Heparin Dosing Weight: 60 kg  Vital Signs: Temp: 98.6 F (37 C) (12/11 0330) BP: 114/78 (12/11 1100) Pulse Rate: 91 (12/11 1100)  Labs: Recent Labs    01/07/23 1427 01/07/23 1832 01/08/23 0134 01/08/23 0328 01/08/23 0901 01/08/23 1049  HGB 11.1*  --   --  10.6*  --   --   HCT 34.8*  --   --  33.2*  --   --   PLT 227  --   --  199  --   --   APTT  --   --  32  --   --   --   LABPROT  --   --  13.1  --   --   --   INR  --   --  1.0  --   --   --   HEPARINUNFRC  --   --   --  <0.10*  --  0.10*  CREATININE 0.92  --   --  0.89  --   --   CKTOTAL  --  163  --   --   --   --   TROPONINIHS 184*  385* 646*  --  788* 915*    Estimated Creatinine Clearance: 56.1 mL/min (by C-G formula based on SCr of 0.89 mg/dL).   Medical History: Past Medical History:  Diagnosis Date   Allergy    Anemia    Anxiety    Arthritis    Asthma    Blood transfusion without reported diagnosis    C. difficile diarrhea 04/07/2016   CAP (community acquired pneumonia) 03/21/2015   Combined forms of age-related cataract of both eyes 05/06/2016   COPD (chronic obstructive pulmonary disease) (HCC)    CVA (cerebral vascular accident) (HCC) 06/17/2018   Diabetes mellitus without complication (HCC)    Displacement of lumbar intervertebral disc without myelopathy    Emphysema of lung (HCC)    GERD (gastroesophageal reflux disease)    Glaucoma    History of chicken pox    Hyperlipidemia    Hypertension    NSTEMI (non-ST elevated myocardial infarction) (HCC) 01/07/2023   Pneumonia 03/29/2015   Sepsis (HCC) 03/17/2016   Shortness of breath  Small cell lung cancer (HCC)     Medications:  (Not in a hospital admission)  Scheduled:   albuterol  2.5 mg Nebulization Q6H   [START ON 01/09/2023] aspirin  81 mg Oral Pre-Cath   aspirin EC  81 mg Oral Daily   busPIRone  5 mg Oral TID   clopidogrel  75 mg Oral Daily   fluticasone furoate-vilanterol  1 puff Inhalation Daily   And   umeclidinium bromide  1 puff Inhalation Daily   heparin  1,800 Units Intravenous Once   levothyroxine  50 mcg Oral Q0600   metoprolol succinate  50 mg Oral Daily   sertraline  50 mg Oral Daily   sodium chloride flush  3 mL Intravenous Q12H   sodium chloride flush  3 mL Intravenous Q12H   Infusions:   sodium chloride     sodium chloride     sodium chloride     heparin 950 Units/hr (01/08/23 0424)    Assessment: 67 y.o. female with PMH including frequent falls, CVA (2020), CAD, HTN, T2DM, HLD, hypertriglyceridemia who presents following a fall. Patient evaluated by cardiology who diagnosed NSTEMI and has scheduled a LHC to be  performed today. She is not on chronic anticoagulation. Pharmacy has been consulted to initiate and manage heparin infusion.  Goal of Therapy:  Heparin level 0.3-0.7 units/ml Monitor platelets by anticoagulation protocol: Yes Patient with hypertriglyceridemia, which can falsely suppress heparin levels  Date Time aPTT/HL Rate/Comment 12/11 0328 <0.10  Subtherapeutic 12/11 1049 0.10  Subtherapeutic   Plan:  Give 1800 units bolus x1; then increase rate of heparin infusion to 1200 units/hour. Check heparin level in 6 hours, then daily once at least two levels are consecutively therapeutic. Continue to monitor CBC daily while on heparin infusion.   Will M. Dareen Piano, PharmD Clinical Pharmacist 01/08/2023 11:38 AM

## 2023-01-08 NOTE — Progress Notes (Signed)
PROGRESS NOTE    Yvette Riley  WUX:[id number] DOB: [birth year] DOA: 01/07/2023 PCP: Sherol Dade, DO    Brief Narrative:   67 y.o. female with medical history significant for frequent falls,Lipitor CVA with residual right-sided weakness on aspirin/Plavix, CAD, HLD, DM2, HTN, GERD, small cell lung cancer in remission s /p radiation/chemotherapy, asthma/COPD, anxiety/depression, chronic pain on long term opiates,  coming to [place] for fall.  Chart review shows patient had an upper endoscopy on December 25, 2022 showing:- Food in the mid esophagus.  Chart review shows patient had an upper endoscopy in November 2024 showing- Normal stomach. - Normal examined duodenum. - Normal gastroesophageal junction and esophagus.    Assessment & Plan:   Principal Problem:   Fall Active Problems:   NSTEMI (non-ST elevated myocardial infarction) (HCC)   COPD (chronic obstructive pulmonary disease) (HCC)   Type 2 diabetes mellitus (HCC)   Anemia   Glaucoma   Small cell lung cancer, right (HCC)  * Fall Patient reports multiple falls within the last several weeks.  Etiology unclear CT angio negative for PE Unable to rule out syncopal events Plan: Fall precautions Therapy evaluations    NSTEMI (non-ST elevated myocardial infarction) (HCC) Slowly uptrending troponins Cardiology consulted Plan: Diagnostic catheterization today with angioplasty Uninterrupted DAPT x 12 months Possible repeat PCI in 2 to 3 weeks  Small cell lung cancer, right [facility]) Outpatient follow-up   Glaucoma Resume any home eyedrops.   Anemia Continue p.o. B12 supplement   Type 2 diabetes mellitus (HCC) Last A1c was a year ago at 6.9 and current regimen includes metformin and NovoLog. We will cover patient with glycemic protocol currently.   COPD (chronic obstructive pulmonary disease) (HCC) Clinically stable no wheezing noted on exam no respiratory distress. Continue with patient's Trelegy Ellipta,  Singulair, as needed albuterol.      DVT prophylaxis: Heparin GTT Code Status: DNR Family Communication: None today Disposition Plan: Status is: Inpatient Remains inpatient appropriate because: NSTEMI on heparin gtt.   Level of care: Progressive  Consultants:  Cardiology  Procedures:  Left heart catheterization  Antimicrobials: None   Subjective: Seen and examined.  Sitting in bed.  Endorses back pain.  Objective: Vitals:   01/08/23 1600 01/08/23 1615 01/08/23 1630 01/08/23 1645  BP: (!) 125/92 (!) 123/95 117/65 (!) 117/90  Pulse: 87 82 83 84  Resp: 13 16 14 12   Temp:      TempSrc:      SpO2: 96% 98% 100% 92%  Weight:      Height:       No intake or output data in the 24 hours ending 01/08/23 1649 Filed Weights   01/07/23 1354 01/08/23 1326  Weight: 81.6 kg 81.6 kg    Examination:  General exam: Appears calm and comfortable  Respiratory system: Scattered crackles bilaterally.  Normal work of breathing.  Room air Cardiovascular system: S1-2, RRR, no murmurs, no pedal edema Gastrointestinal system: Soft, T/ND, bowel sounds Central nervous system: Alert and oriented. No focal neurological deficits. Extremities: Symmetric 5 x 5 power. Skin: No rashes, lesions or ulcers Psychiatry: Judgement and insight appear normal. Mood & affect appropriate.     Data Reviewed: I have personally reviewed following labs and imaging studies  CBC: Recent Labs  Lab 01/04/23 1638 01/07/23 1427 01/08/23 0328  WBC 14.4* 7.7 6.3  HGB 12.5 11.1* 10.6*  HCT 39.3 34.8* 33.2*  MCV 84.5 85.3 83.8  PLT 202 227 199   Basic Metabolic Panel: Recent Labs  Lab 01/04/23 1638  01/07/23 1427 01/08/23 0328  NA 135 134* 134*  K 3.9 4.0 4.1  CL 98 100 101  CO2 24 21* 24  GLUCOSE 189* 137* 147*  BUN 12 14 16   CREATININE 0.76 0.92 0.89  CALCIUM 9.0 8.6* 8.5*   GFR: Estimated Creatinine Clearance: 56.1 mL/min (by C-G formula based on SCr of 0.89 mg/dL). Liver Function  Tests: Recent Labs  Lab 01/04/23 1638 01/07/23 1832  AST 31 29  ALT 19 16  ALKPHOS 78 80  BILITOT 1.4* 0.6  PROT 7.9 7.3  ALBUMIN 4.4 3.9   Recent Labs  Lab 01/04/23 1638  LIPASE 22   No results for input(s): "AMMONIA" in the last 168 hours. Coagulation Profile: Recent Labs  Lab 01/08/23 0134  INR 1.0   Cardiac Enzymes: Recent Labs  Lab 01/07/23 1832  CKTOTAL 163   BNP (last 3 results) No results for input(s): "PROBNP" in the last 8760 hours. HbA1C: Recent Labs    01/07/23 1424  HGBA1C 6.7*   CBG: Recent Labs  Lab 01/08/23 0332 01/08/23 0729 01/08/23 1139 01/08/23 1352  GLUCAP 132* 124* 140* 129*   Lipid Profile: Recent Labs    01/08/23 0328  CHOL 167  HDL 31*  LDLCALC UNABLE TO CALCULATE IF TRIGLYCERIDE OVER 400 mg/dL  TRIG 875*  CHOLHDL 5.4   Thyroid Function Tests: Recent Labs    01/08/23 0134  TSH 7.022*  FREET4 0.73   Anemia Panel: No results for input(s): "VITAMINB12", "FOLATE", "FERRITIN", "TIBC", "IRON", "RETICCTPCT" in the last 72 hours. Sepsis Labs: Recent Labs  Lab 01/08/23 0134  LATICACIDVEN 1.7    Recent Results (from the past 240 hour(s))  SARS Coronavirus 2 by RT PCR (hospital order, performed in [facility] hospital lab) *cepheid single result test* Anterior Nasal Swab     Status: None   Collection Time: 01/04/23  8:06 PM   Specimen: Anterior Nasal Swab  Result Value Ref Range Status   SARS Coronavirus 2 by RT PCR NEGATIVE NEGATIVE Final    Comment: (NOTE) SARS-CoV-2 target nucleic acids are NOT DETECTED.  The SARS-CoV-2 RNA is generally detectable in upper and lower respiratory specimens during the acute phase of infection. The lowest concentration of SARS-CoV-2 viral copies this assay can detect is 250 copies / mL. A negative result does not preclude SARS-CoV-2 infection and should not be used as the sole basis for treatment or other patient management decisions.  A negative result may occur with improper  specimen collection / handling, submission of specimen other than nasopharyngeal swab, presence of viral mutation(s) within the areas targeted by this assay, and inadequate number of viral copies (<250 copies / mL). A negative result must be combined with clinical observations, patient history, and epidemiological information.  Fact Sheet for Patients:   RoadLapTop.co.za  Fact Sheet for Healthcare Providers: http://kim-miller.com/  This test is not yet approved or  cleared by the [place] FDA and has been authorized for detection and/or diagnosis of SARS-CoV-2 by FDA under an Emergency Use Authorization (EUA).  This EUA will remain in effect (meaning this test can be used) for the duration of the COVID-19 declaration under Section 564(b)(1) of the Act, 21 U.S.C. section 360bbb-3(b)(1), unless the authorization is terminated or revoked sooner.  Performed at [facility], [address], [place], [place] [address]          Radiology Studies: CARDIAC CATHETERIZATION  Result Date: 01/08/2023   Prox RCA lesion is 40% stenosed.   3rd Mrg lesion is 20% stenosed.  1st Mrg-1 lesion is 70% stenosed.   1st Mrg-2 lesion is 70% stenosed.   Mid LAD lesion is 95% stenosed.   LV end diastolic pressure is moderately elevated.   The left ventricular ejection fraction is greater than 65% by visual estimate.   Recommend dual antiplatelet therapy with Aspirin 81mg  daily and Ticagrelor 90mg  twice daily. Patient has two-vessel disease with critical mid LAD and high-grade lesion in OM1 in the proximal and midportion.  RCA is a small vessel and has mild disease in the midportion.  LVEF was normal about 65% but elevated LVEDP of 27.  Normal wall motion.  Patient has a history of presyncope chest pain and elevated troponin 900 range all suggestive of possible culprit for the symptoms being mid LAD.  Will pursue PCI and stenting of the mid LAD and  follow-up PCI and stenting in OM as an outpatient in 2 to 3 weeks.  Patient will be kept overnight and will be discharged either tomorrow or day after depending on how she does.   CT Angio Chest Pulmonary Embolism (PE) W or WO Contrast  Result Date: 01/07/2023 CLINICAL DATA:  Recent syncopal episode, history of lung carcinoma, initial encounter EXAM: CT ANGIOGRAPHY CHEST WITH CONTRAST TECHNIQUE: Multidetector CT imaging of the chest was performed using the standard protocol during bolus administration of intravenous contrast. Multiplanar CT image reconstructions and MIPs were obtained to evaluate the vascular anatomy. RADIATION DOSE REDUCTION: This exam was performed according to the departmental dose-optimization program which includes automated exposure control, adjustment of the mA and/or kV according to patient size and/or use of iterative reconstruction technique. CONTRAST:  75mL OMNIPAQUE IOHEXOL 350 MG/ML SOLN COMPARISON:  12/30/2022 FINDINGS: Cardiovascular: Thoracic aorta shows mild atherosclerotic calcifications. No aneurysmal dilatation or dissection is noted. No cardiac enlargement is seen. Coronary calcifications are noted. The pulmonary artery shows a normal branching pattern bilaterally. No filling defect to suggest pulmonary embolism is seen. Mediastinum/Nodes: Thoracic inlet is within normal limits. No hilar or mediastinal adenopathy is noted. Stable distal paraesophageal node is noted measuring 8 mm in short axis. Lungs/Pleura: Lungs are well aerated bilaterally. Diffuse emphysematous changes are seen. No acute infiltrate is seen. Post radiation changes are noted in the medial aspect of the right upper and lower lobes stable from the prior study. No sizable effusion is seen. No parenchymal nodules are noted. Upper Abdomen: Visualized upper abdomen shows e acute abnormality. Musculoskeletal: Mild degenerative changes of the thoracic spine are noted. No acute rib abnormality is seen. Review of  the MIP images confirms the above findings. IMPRESSION: No evidence of pulmonary emboli. Post radiation changes in the medial right hemithorax. These are stable from the prior exam. Stable distal paraesophageal lymph node. Aortic Atherosclerosis (ICD10-I70.0). Electronically Signed   By: Alcide Clever M.D.   On: 01/07/2023 20:55   MR BRAIN WO CONTRAST  Result Date: 01/07/2023 CLINICAL DATA:  Neuro deficit with acute stroke suspected. EXAM: MRI HEAD WITHOUT CONTRAST TECHNIQUE: Multiplanar, multiecho pulse sequences of the brain and surrounding structures were obtained without intravenous contrast. COMPARISON:  Head CT 01/04/2023 FINDINGS: Brain: No acute infarction, hemorrhage, hydrocephalus, extra-axial collection or mass lesion. Extensive chronic infarction in the right cerebellum and to a lesser extent in the right occipital lobe. Ischemic gliosis in the periventricular white matter is mild for age. Mild cerebral volume loss, more pronounced volume loss in the posterior fossa, stable from a 2022 brain MRI. Vascular: Major flow voids are stable, including diminutive right vertebral artery Skull and upper cervical spine:  Normal marrow signal Sinuses/Orbits: Unremarkable IMPRESSION: 1. No acute finding. 2. Chronic right cerebellar and right occipital infarcts. Electronically Signed   By: Tiburcio Pea M.D.   On: 01/07/2023 19:43   DG Hip Unilat W or Wo Pelvis 2-3 Views Left  Result Date: 01/07/2023 CLINICAL DATA:  Fall and left hip pain. EXAM: DG HIP (WITH OR WITHOUT PELVIS) 2-3V LEFT COMPARISON:  None Available. FINDINGS: There is no acute fracture or dislocation. The bones are osteopenic. Mild bilateral hip arthritic changes. The soft tissues are unremarkable. IMPRESSION: 1. No acute fracture or dislocation. 2. Osteopenia. Electronically Signed   By: Elgie Collard M.D.   On: 01/07/2023 15:42        Scheduled Meds:  [MAR Hold] albuterol  2.5 mg Nebulization Q6H   [START ON 01/09/2023] aspirin   81 mg Oral Pre-Cath   [START ON 01/09/2023] aspirin  81 mg Oral Daily   [MAR Hold] aspirin EC  81 mg Oral Daily   [MAR Hold] busPIRone  5 mg Oral TID   [MAR Hold] clopidogrel  75 mg Oral Daily   [START ON 01/09/2023] clopidogrel  75 mg Oral Q breakfast   [MAR Hold] fluticasone furoate-vilanterol  1 puff Inhalation Daily   And   [MAR Hold] umeclidinium bromide  1 puff Inhalation Daily   [MAR Hold] levothyroxine  50 mcg Oral Q0600   [MAR Hold] metoprolol succinate  50 mg Oral Daily   [MAR Hold] sertraline  50 mg Oral Daily   [MAR Hold] sodium chloride flush  3 mL Intravenous Q12H   sodium chloride flush  3 mL Intravenous Q12H   [START ON 01/09/2023] sodium chloride flush  3 mL Intravenous Q12H   Continuous Infusions:  [MAR Hold] sodium chloride     sodium chloride     [START ON 01/09/2023] sodium chloride     sodium chloride     sodium chloride 1 mL/kg/hr (01/08/23 1647)   sodium chloride     bivalirudin (ANGIOMAX) 250 mg in sodium chloride 0.9 % 50 mL (5 mg/mL) infusion Stopped (01/08/23 1530)     LOS: 1 day   Tresa Moore, MD [facility]   If 7PM-7AM, please contact night-coverage  01/08/2023, 4:49 PM

## 2023-01-08 NOTE — Progress Notes (Signed)
Patient underwent cardiac catheterization without complication.  Patient has a normal left ventricular systolic function approximately 65% with mild moderately elevated LVEDP of 28.  She has two-vessel coronary artery disease with critical mid LAD 95% lesion.  She also has OM1 multiple lesion in the proximal and mid.  Portion about 70 to 80%.  She also has mild disease in the mid nondominant right coronary.  Patient will have PCI and stenting of the mid LAD in 2 to 3 weeks if she still has symptoms we will do PCI and stenting of the OM1.  Patient can be discharged tomorrow if she is stable otherwise thereafter on Friday.

## 2023-01-08 NOTE — CV Procedure (Signed)
Brief post PCI report  Indication non-STEMI angina Diagnostic cardiac cath performed by Dr. Welton Flakes Plan intervention of mid LAD Stage distal  OM 2 for a later date  XB LAD 6 French BMW wire Angiomax IV Aspirin Plavix Predilatation balloon 3.25 x 12 mm trek  Successful PCI and stent of mid LAD 95% TIMI-3 flow Deployment of a 2.25 x 12 mm frontier Onyx to 12 atm Lesion reduced from 95 down to 0%  Patient is to continue aspirin Plavix for at least 12 months Angiomax discontinued soon after the case Patient is to be transferred to regular floor bed Right groin access site was sealed with a Mynx Patient tolerated procedure well No complications  Full PCI/Stent note to follow [facility] Interventional cardiology

## 2023-01-08 NOTE — ED Notes (Signed)
MD at bedside. 

## 2023-01-08 NOTE — Progress Notes (Signed)
Patient fell right at beginning of shift change  piror to shift report . Tiffany night CN and day RN Victorino Dike went to see patient. Per CN patient was on the floor.Day RN notified day provider without patient attachment and stated that she had to go and stated that she will be back tommorrw to document.  This nurse notified NP Jawo and CT of the head ordered. Patient is alert and oriented x4. Vital sign stable. She has a knot on the mid posterior of her head. called and spoke with Sima Matas patient's brother on the the event. Patient on the way to CT now.

## 2023-01-08 NOTE — Plan of Care (Signed)
  Problem: Education: Goal: Ability to describe self-care measures that may prevent or decrease complications (Diabetes Survival Skills Education) will improve Outcome: Progressing Goal: Individualized Educational Video(s) Outcome: Progressing   Problem: Coping: Goal: Ability to adjust to condition or change in health will improve Outcome: Progressing   Problem: Fluid Volume: Goal: Ability to maintain a balanced intake and output will improve Outcome: Progressing   Problem: Health Behavior/Discharge Planning: Goal: Ability to identify and utilize available resources and services will improve Outcome: Progressing Goal: Ability to manage health-related needs will improve Outcome: Progressing   Problem: Metabolic: Goal: Ability to maintain appropriate glucose levels will improve Outcome: Progressing   Problem: Nutritional: Goal: Maintenance of adequate nutrition will improve Outcome: Progressing Goal: Progress toward achieving an optimal weight will improve Outcome: Progressing   Problem: Skin Integrity: Goal: Risk for impaired skin integrity will decrease Outcome: Progressing   Problem: Tissue Perfusion: Goal: Adequacy of tissue perfusion will improve Outcome: Progressing   Problem: Education: Goal: Understanding of cardiac disease, CV risk reduction, and recovery process will improve Outcome: Progressing Goal: Individualized Educational Video(s) Outcome: Progressing   Problem: Activity: Goal: Ability to tolerate increased activity will improve Outcome: Progressing   Problem: Cardiac: Goal: Ability to achieve and maintain adequate cardiovascular perfusion will improve Outcome: Progressing   Problem: Health Behavior/Discharge Planning: Goal: Ability to safely manage health-related needs after discharge will improve Outcome: Progressing   Problem: Education: Goal: Understanding of CV disease, CV risk reduction, and recovery process will improve Outcome:  Progressing Goal: Individualized Educational Video(s) Outcome: Progressing   Problem: Activity: Goal: Ability to return to baseline activity level will improve Outcome: Progressing   Problem: Cardiovascular: Goal: Ability to achieve and maintain adequate cardiovascular perfusion will improve Outcome: Progressing Goal: Vascular access site(s) Level 0-1 will be maintained Outcome: Progressing   Problem: Health Behavior/Discharge Planning: Goal: Ability to safely manage health-related needs after discharge will improve Outcome: Progressing   Problem: Education: Goal: Knowledge of General Education information will improve Description: Including pain rating scale, medication(s)/side effects and non-pharmacologic comfort measures Outcome: Progressing   Problem: Health Behavior/Discharge Planning: Goal: Ability to manage health-related needs will improve Outcome: Progressing   Problem: Clinical Measurements: Goal: Ability to maintain clinical measurements within normal limits will improve Outcome: Progressing Goal: Will remain free from infection Outcome: Progressing Goal: Diagnostic test results will improve Outcome: Progressing Goal: Respiratory complications will improve Outcome: Progressing Goal: Cardiovascular complication will be avoided Outcome: Progressing   Problem: Activity: Goal: Risk for activity intolerance will decrease Outcome: Progressing   Problem: Nutrition: Goal: Adequate nutrition will be maintained Outcome: Progressing   Problem: Coping: Goal: Level of anxiety will decrease Outcome: Progressing   Problem: Elimination: Goal: Will not experience complications related to bowel motility Outcome: Progressing Goal: Will not experience complications related to urinary retention Outcome: Progressing   Problem: Pain Management: Goal: General experience of comfort will improve Outcome: Progressing   Problem: Safety: Goal: Ability to remain free from  injury will improve Outcome: Progressing   Problem: Skin Integrity: Goal: Risk for impaired skin integrity will decrease Outcome: Progressing

## 2023-01-09 ENCOUNTER — Other Ambulatory Visit: Payer: Self-pay

## 2023-01-09 DIAGNOSIS — R55 Syncope and collapse: Secondary | ICD-10-CM | POA: Diagnosis not present

## 2023-01-09 DIAGNOSIS — W19XXXD Unspecified fall, subsequent encounter: Secondary | ICD-10-CM | POA: Diagnosis not present

## 2023-01-09 DIAGNOSIS — I214 Non-ST elevation (NSTEMI) myocardial infarction: Secondary | ICD-10-CM | POA: Diagnosis not present

## 2023-01-09 LAB — GLUCOSE, CAPILLARY
Glucose-Capillary: 124 mg/dL — ABNORMAL HIGH (ref 70–99)
Glucose-Capillary: 146 mg/dL — ABNORMAL HIGH (ref 70–99)
Glucose-Capillary: 137 mg/dL — ABNORMAL HIGH (ref 70–99)
Glucose-Capillary: 170 mg/dL — ABNORMAL HIGH (ref 70–99)

## 2023-01-09 LAB — CBC
HCT: 33.5 % — ABNORMAL LOW (ref 36.0–46.0)
Hemoglobin: 10.8 g/dL — ABNORMAL LOW (ref 12.0–15.0)
MCH: 26.7 pg (ref 26.0–34.0)
MCHC: 32.2 g/dL (ref 30.0–36.0)
MCV: 82.7 fL (ref 80.0–100.0)
Platelets: 207 10*3/uL (ref 150–400)
RBC: 4.05 MIL/uL (ref 3.87–5.11)
RDW: 14 % (ref 11.5–15.5)
WBC: 7.4 10*3/uL (ref 4.0–10.5)
nRBC: 0 % (ref 0.0–0.2)

## 2023-01-09 LAB — BASIC METABOLIC PANEL
Anion gap: 10 (ref 5–15)
BUN: 9 mg/dL (ref 8–23)
CO2: 24 mmol/L (ref 22–32)
Calcium: 8.4 mg/dL — ABNORMAL LOW (ref 8.9–10.3)
Chloride: 101 mmol/L (ref 98–111)
Creatinine, Ser: 0.65 mg/dL (ref 0.44–1.00)
GFR, Estimated: >60 mL/min (ref >60)
Glucose, Bld: 126 mg/dL — ABNORMAL HIGH (ref 70–99)
Potassium: 3.8 mmol/L (ref 3.5–5.1)
Sodium: 135 mmol/L (ref 135–145)

## 2023-01-09 MED ORDER — ENOXAPARIN SODIUM 40 MG/0.4ML IJ SOSY
40.0000 mg | PREFILLED_SYRINGE | INTRAMUSCULAR | Status: DC
  Administered 2023-01-09 – 2023-01-13 (×5): 40 mg via SUBCUTANEOUS
  Filled 2023-01-09 (×5): qty 0.4

## 2023-01-09 MED ORDER — SODIUM CHLORIDE 0.9% FLUSH
3.0000 mL | Freq: Two times a day (BID) | INTRAVENOUS | Status: DC
Start: 2023-01-09 — End: 2023-01-14
  Administered 2023-01-09 – 2023-01-14 (×11): 3 mL via INTRAVENOUS

## 2023-01-09 MED ORDER — DICLOFENAC SODIUM 1 % EX GEL
4.0000 g | Freq: Four times a day (QID) | CUTANEOUS | Status: DC
Start: 2023-01-09 — End: 2023-01-14
  Administered 2023-01-09 – 2023-01-14 (×16): 4 g via TOPICAL
  Filled 2023-01-09: qty 100

## 2023-01-09 NOTE — TOC Progression Note (Signed)
Transition of Care [facility]) - Progression Note    Patient Details  Name: Yvette Riley MRN: [id number] Date of Birth: 04-14-1955  [facility]) CM/SW Contact  Truddie Hidden, RN Phone Number: 01/09/2023, 3:26 PM  Clinical Narrative:    Patient is LTC at [facility] and has a STR rec from therapy. Auth will be required for STR.         Expected Discharge Plan and Services                                               Social Determinants of Health (SDOH) Interventions SDOH Screenings   Food Insecurity: No Food Insecurity (01/08/2023)  Housing: Low Risk  (01/08/2023)  Transportation Needs: No Transportation Needs (01/08/2023)  Utilities: Not At Risk (01/08/2023)  Alcohol Screen: Low Risk  (09/25/2021)  Depression (PHQ2-9): High Risk (09/25/2021)  Financial Resource Strain: Low Risk  (09/05/2021)  Physical Activity: Inactive (09/05/2021)  Social Connections: Unknown (06/01/2021)   Received from [facility]  Stress: Stress Concern Present (09/05/2021)  Tobacco Use: Medium Risk (01/07/2023)    Readmission Risk Interventions     No data to display

## 2023-01-09 NOTE — Evaluation (Signed)
Physical Therapy Evaluation Patient Details Name: Yvette Riley MRN: [id number] DOB: [birth year] Today's Date: 01/09/2023  History of Present Illness  Yvette Riley is a 67 y.o. female with medical history significant for frequent falls, allergy to penicillin, yellowjacket venom, codeine, Crestor, gemfibrozil, trazodone, Lipitor CVA with residual right-sided weakness on aspirin/Plavix, CAD, HLD, DM2, HTN, GERD, small cell lung cancer in remission s /p radiation/chemotherapy, asthma/COPD, anxiety/depression, chronic pain on long term opiates, coming to [place] for fall. Patient is s/p cardiac cath 01/08/23.  Clinical Impression  Patient received in bed, she is A & O, states she fell yesterday trying to move tray and slipped. Patient requires cues and min A for bed mobility. Heavy use of bed rail. She is able to stand with min A and pivot to recliner and then to [facility] for BM. NT in room at end of session. Patient will continue to benefit from skilled PT to improve safety and independence with mobility. She is limited by chronic back pain.      If plan is discharge home, recommend the following: A lot of help with walking and/or transfers;A little help with bathing/dressing/bathroom;Help with stairs or ramp for entrance;Assist for transportation;Direct supervision/assist for medications management   Can travel by private vehicle   No    Equipment Recommendations None recommended by PT  Recommendations for Other Services       Functional Status Assessment Patient has had a recent decline in their functional status and demonstrates the ability to make significant improvements in function in a reasonable and predictable amount of time.     Precautions / Restrictions Precautions Precautions: Fall Precaution Comments: patient had fall here yesterday Restrictions Weight Bearing Restrictions Per Provider Order: No      Mobility  Bed Mobility Overal bed mobility: Needs Assistance Bed  Mobility: Supine to Sit     Supine to sit: Contact guard          Transfers Overall transfer level: Needs assistance Equipment used: 1 person hand held assist Transfers: Sit to/from Stand, Bed to chair/wheelchair/BSC Sit to Stand: Contact guard assist, Min assist   Step pivot transfers: Min assist       General transfer comment: patient requires cues for safety, hand placement, progression    Ambulation/Gait               General Gait Details: not currently ambulatory  Stairs            Wheelchair Mobility     Tilt Bed    Modified Rankin (Stroke Patients Only)       Balance Overall balance assessment: Needs assistance, History of Falls Sitting-balance support: Feet supported Sitting balance-Leahy Scale: Good     Standing balance support: Bilateral upper extremity supported, During functional activity Standing balance-Leahy Scale: Fair                               Pertinent Vitals/Pain Pain Assessment Pain Assessment: Faces Faces Pain Scale: Hurts even more Pain Location: back, she states is chronic Pain Descriptors / Indicators: Discomfort, Grimacing, Moaning Pain Intervention(s): Monitored during session, Repositioned    Home Living Family/patient expects to be discharged to:: Skilled nursing facility                   Additional Comments: [facility]    Prior Function Prior Level of Function : Needs assist;History of Falls (last six months)  Physical Assist : Mobility (physical);ADLs (physical)     Mobility Comments: generally uses MWC .Was amb with PT with RW but was discharged some time ago. ADLs Comments: set up for grooming, feeding, UB dressing; assist for LB dressing;     Extremity/Trunk Assessment   Upper Extremity Assessment Upper Extremity Assessment: Defer to OT evaluation    Lower Extremity Assessment Lower Extremity Assessment: Generalized weakness    Cervical /  Trunk Assessment Cervical / Trunk Assessment: Normal  Communication   Communication Communication: No apparent difficulties Cueing Techniques: Verbal cues  Cognition Arousal: Alert Behavior During Therapy: WFL for tasks assessed/performed Overall Cognitive Status: Within Functional Limits for tasks assessed                                          General Comments      Exercises     Assessment/Plan    PT Assessment Patient needs continued PT services  PT Problem List Decreased strength;Decreased activity tolerance;Decreased balance;Decreased mobility;Decreased safety awareness;Decreased knowledge of use of DME;Pain;Obesity       PT Treatment Interventions DME instruction;Gait training;Therapeutic exercise;Balance training;Functional mobility training;Therapeutic activities;Patient/family education    PT Goals (Current goals can be found in the Care Plan section)  Acute Rehab PT Goals Patient Stated Goal: to return to LTC PT Goal Formulation: With patient Time For Goal Achievement: 01/16/23 Potential to Achieve Goals: Good    Frequency Min 1X/week     Co-evaluation               AM-PAC PT "6 Clicks" Mobility  Outcome Measure Help needed turning from your back to your side while in a flat bed without using bedrails?: A Little Help needed moving from lying on your back to sitting on the side of a flat bed without using bedrails?: A Lot Help needed moving to and from a bed to a chair (including a wheelchair)?: A Little Help needed standing up from a chair using your arms (e.g., wheelchair or bedside chair)?: A Little Help needed to walk in hospital room?: A Lot Help needed climbing 3-5 steps with a railing? : Total 6 Click Score: 14    End of Session   Activity Tolerance: Patient limited by pain Patient left: Other (comment) (patient left with NT sitting on BSC, RN also notified) Nurse Communication: Mobility status PT Visit Diagnosis: Other  abnormalities of gait and mobility (R26.89);Muscle weakness (generalized) (M62.81);Repeated falls (R29.6);Difficulty in walking, not elsewhere classified (R26.2);Pain Pain - part of body:  (back)    Time: 2440-1027 PT Time Calculation (min) (ACUTE ONLY): 18 min   Charges:   PT Evaluation $PT Eval Moderate Complexity: 1 Mod   PT General Charges $$ ACUTE PT VISIT: 1 Visit         Yvette Riley, PT, GCS 01/09/23,9:53 AM

## 2023-01-09 NOTE — Evaluation (Signed)
Occupational Therapy Evaluation Patient Details Name: Yvette Riley MRN: [id number] DOB: March 09, 1955 Today's Date: 01/09/2023   History of Present Illness Yvette Riley is a 67 y.o. female with medical history significant for frequent falls, allergy to penicillin, yellowjacket venom, codeine, Crestor, gemfibrozil, trazodone, Lipitor CVA with residual right-sided weakness on aspirin/Plavix, CAD, HLD, DM2, HTN, GERD, small cell lung cancer in remission s /p radiation/chemotherapy, asthma/COPD, anxiety/depression, chronic pain on long term opiates, coming to [place] for fall. Patient is s/p cardiac cath 01/08/23.   Clinical Impression   Chart reviewed, pt greeted in bed, agreeable to OT evaulation. Pt reports she had a fall last night, is slightly weaker than baseline. PTA pt lives at LTC, has assist for LB dressing, ADLs, transfer to [place], typically uses mwc throughout facility. Pt presents with deficits in strength, endurance,activity tolerance affecting safe and optimal ADL completion.Bed mobility completed with supervision, SPT to bedside chair and bsc with MIN A+1-2, LB dressing completed with MAX A, feeding/grooming with SET UP. Pt is performing Adl slightly below previous baseline, would beneift from ongoing OT to address deficits.     If plan is discharge home, recommend the following: A little help with walking and/or transfers;A little help with bathing/dressing/bathroom;Help with stairs or ramp for entrance;Assist for transportation;Assistance with cooking/housework    Functional Status Assessment  Patient has had a recent decline in their functional status and demonstrates the ability to make significant improvements in function in a reasonable and predictable amount of time.  Equipment Recommendations  Other (comment) (per next venue of care)    Recommendations for Other Services       Precautions / Restrictions Precautions Precautions: Fall Precaution Comments: patient had fall  here yesterday Restrictions Weight Bearing Restrictions Per Provider Order: No      Mobility Bed Mobility Overal bed mobility: Needs Assistance Bed Mobility: Supine to Sit     Supine to sit: Contact guard          Transfers Overall transfer level: Needs assistance Equipment used: 1 person hand held assist, 2 person hand held assist Transfers: Sit to/from Stand Sit to Stand: Contact guard assist, Min assist           General transfer comment: intermittent vcs throughout for safety      Balance Overall balance assessment: Needs assistance, History of Falls Sitting-balance support: Feet supported Sitting balance-Leahy Scale: Good     Standing balance support: Bilateral upper extremity supported, During functional activity Standing balance-Leahy Scale: Fair                             ADL either performed or assessed with clinical judgement   ADL Overall ADL's : Needs assistance/impaired     Grooming: Set up;Sitting           Upper Body Dressing : Minimal assistance;Sitting   Lower Body Dressing: Maximal assistance;Sitting/lateral leans   Toilet Transfer: Minimal assistance (+1-2 stand pivot transfer)                   Vision Patient Visual Report: No change from baseline       Perception         Praxis         Pertinent Vitals/Pain Pain Assessment Pain Assessment: Faces Faces Pain Scale: Hurts even more Pain Location: back, she states is chronic Pain Descriptors / Indicators: Discomfort, Grimacing, Moaning Pain Intervention(s): Monitored during session, Repositioned     Extremity/Trunk Assessment  Upper Extremity Assessment Upper Extremity Assessment: Generalized weakness   Lower Extremity Assessment Lower Extremity Assessment: Generalized weakness   Cervical / Trunk Assessment Cervical / Trunk Assessment: Normal   Communication Communication Communication: No apparent difficulties Cueing Techniques: Verbal  cues   Cognition Arousal: Alert Behavior During Therapy: WFL for tasks assessed/performed Overall Cognitive Status: No family/caregiver present to determine baseline cognitive functioning Area of Impairment: Safety/judgement                         Safety/Judgement: Decreased awareness of deficits           General Comments  vss throughout, noted raised bump on head from fall last night    Exercises Other Exercises Other Exercises: edu re: role of OT, role of rehab, discharge recommendations   Shoulder Instructions      Home Living Family/patient expects to be discharged to:: Skilled nursing facility                                 Additional Comments: [facility]      Prior Functioning/Environment Prior Level of Function : Needs assist;History of Falls (last six months)       Physical Assist : Mobility (physical);ADLs (physical)     Mobility Comments: generally uses MWC .Was amb with PT with RW but was discharged some time ago. ADLs Comments: set up for grooming, feeding, UB dressing; assist for LB dressing, bathing; assist for all IADLs        OT Problem List:        OT Treatment/Interventions: Self-care/ADL training;Energy conservation;Therapeutic exercise;Patient/family education;Therapeutic activities;DME and/or AE instruction;Balance training    OT Goals(Current goals can be found in the care plan section) Acute Rehab OT Goals Patient Stated Goal: return to LTC with rehab OT Goal Formulation: With patient Time For Goal Achievement: 01/23/23 Potential to Achieve Goals: Good ADL Goals Pt Will Perform Grooming: with set-up;with supervision;sitting Pt Will Perform Lower Body Dressing: with mod assist;sitting/lateral leans Pt Will Transfer to Toilet: with contact guard assist;stand pivot transfer Pt Will Perform Toileting - Clothing Manipulation and hygiene: with mod assist;sitting/lateral leans  OT Frequency: Min  1X/week    Co-evaluation PT/OT/SLP Co-Evaluation/Treatment: Yes Reason for Co-Treatment: For patient/therapist safety   OT goals addressed during session: ADL's and self-care      AM-PAC OT "6 Clicks" Daily Activity     Outcome Measure Help from another person eating meals?: None Help from another person taking care of personal grooming?: None Help from another person toileting, which includes using toliet, bedpan, or urinal?: A Little Help from another person bathing (including washing, rinsing, drying)?: A Little Help from another person to put on and taking off regular upper body clothing?: None Help from another person to put on and taking off regular lower body clothing?: A Lot 6 Click Score: 20   End of Session Nurse Communication: Mobility status  Activity Tolerance: Patient tolerated treatment well Patient left: Other (comment) (on bsc with all needs met, NT in room)  OT Visit Diagnosis: Other abnormalities of gait and mobility (R26.89)                Time: 1610-9604 OT Time Calculation (min): 18 min Charges:  OT General Charges $OT Visit: 1 Visit OT Evaluation $OT Eval Low Complexity: 1 Low  Oleta Mouse, OTD OTR/L  01/09/23, 1:31 PM

## 2023-01-09 NOTE — Progress Notes (Signed)
       CROSS COVER NOTE  NAME: Yvette Riley MRN: [id number] DOB : January 03, 1956    Date of Service   01/09/2023   HPI/Events of Note   Nurse reported that patient fell during a shift change hitting her head.  No LOC was reported.  Patient alert and oriented and denied any pain.  Chart review showed that patient is anticoagulated secondary to an NSTEMI during his admission.  As concern for possible ICH given coagulation status.  Interventions   -Patient sent for stat head CT which was negative for acute intracranial abnormality. -Nursing instructed to monitor patient and report if any acute changes present.       Dailynn Nancarrow Lamin Geradine Girt, MSN, APRN, AGACNP-BC [facility] [place] Pager: [phone]. Check Amion for Availability

## 2023-01-09 NOTE — Progress Notes (Signed)
At 1905 Patient called out and asked for staff to come check on her. Upon entering the room patient was noted to have a trail of urine on the floor, purewick and tubing on the floor, and gown on the floor. The patient was laying across the bed with urine dripping down her head, back, and legs. Patient stated she had hit her head. Upon examining the patients head there was a large knot on the right/back side of her head that protruded off the skull. Larkin Ina NP notified about the fall. Patient cleaned up and educated on the importance of using the call light. Bed lowered to the floor and bed alarm applied. Floor mats applied by [facility] RN. I accompanied patient down to have a CT completed.

## 2023-01-09 NOTE — Plan of Care (Signed)
   Problem: Activity: Goal: Ability to return to baseline activity level will improve Outcome: Not Progressing

## 2023-01-09 NOTE — Progress Notes (Signed)
SUBJECTIVE: Patient feeling much better   Vitals:   01/09/23 0000 01/09/23 0315 01/09/23 0338 01/09/23 0724  BP: 111/86  114/77   Pulse: 96  (!) 108   Resp: 16  16   Temp: 97.8 F (36.6 C)  97.8 F (36.6 C)   TempSrc: Oral     SpO2: 98% 96% 100% 97%  Weight:      Height:        Intake/Output Summary (Last 24 hours) at 01/09/2023 0801 Last data filed at 01/09/2023 0600 Gross per 24 hour  Intake 1378.48 ml  Output 400 ml  Net 978.48 ml    LABS: Basic Metabolic Panel: Recent Labs    01/08/23 0328 01/09/23 0535  NA 134* 135  K 4.1 3.8  CL 101 101  CO2 24 24  GLUCOSE 147* 126*  BUN 16 9  CREATININE 0.89 0.65  CALCIUM 8.5* 8.4*   Liver Function Tests: Recent Labs    01/07/23 1832  AST 29  ALT 16  ALKPHOS 80  BILITOT 0.6  PROT 7.3  ALBUMIN 3.9   No results for input(s): "LIPASE", "AMYLASE" in the last 72 hours. CBC: Recent Labs    01/08/23 0328 01/09/23 0535  WBC 6.3 7.4  HGB 10.6* 10.8*  HCT 33.2* 33.5*  MCV 83.8 82.7  PLT 199 207   Cardiac Enzymes: Recent Labs    01/07/23 1832  CKTOTAL 163   BNP: Invalid input(s): "POCBNP" D-Dimer: No results for input(s): "DDIMER" in the last 72 hours. Hemoglobin A1C: Recent Labs    01/07/23 1424  HGBA1C 6.7*   Fasting Lipid Panel: Recent Labs    01/08/23 0328  CHOL 167  HDL 31*  LDLCALC UNABLE TO CALCULATE IF TRIGLYCERIDE OVER 400 mg/dL  TRIG 644*  CHOLHDL 5.4   Thyroid Function Tests: Recent Labs    01/08/23 0134  TSH 7.022*   Anemia Panel: No results for input(s): "VITAMINB12", "FOLATE", "FERRITIN", "TIBC", "IRON", "RETICCTPCT" in the last 72 hours.   PHYSICAL EXAM General: Well developed, well nourished, in no acute distress HEENT:  Normocephalic and atramatic Neck:  No JVD.  Lungs: Clear bilaterally to auscultation and percussion. Heart: HRRR . Normal S1 and S2 without gallops or murmurs.  Abdomen: Bowel sounds are positive, abdomen soft and non-tender  Msk:  Back normal,  normal gait. Normal strength and tone for age. Extremities: No clubbing, cyanosis or edema.   Neuro: Alert and oriented X 3. Psych:  Good affect, responds appropriately  TELEMETRY: NSR  ASSESSMENT AND PLAN: S/p PCI, doing well. Right groin ok. Cardiac point of view can go home with f/o Monday 9 AM.   ICD-10-CM   1. NSTEMI (non-ST elevated myocardial infarction) (HCC)  I21.4 CARDIAC CATHETERIZATION    CARDIAC CATHETERIZATION    CARDIAC CATHETERIZATION    CARDIAC CATHETERIZATION      Principal Problem:   Fall Active Problems:   COPD (chronic obstructive pulmonary disease) (HCC)   Type 2 diabetes mellitus (HCC)   Anemia   Glaucoma   Small cell lung cancer, right (HCC)   NSTEMI (non-ST elevated myocardial infarction) [facility])    Adrian Blackwater, MD, [facility] 01/09/2023 8:01 AM

## 2023-01-09 NOTE — Progress Notes (Signed)
PROGRESS NOTE    Yvette Riley  MVH:[id number] DOB: [birth year] DOA: 01/07/2023 PCP: Sherol Dade, DO    Brief Narrative:   67 y.o. female with medical history significant for frequent falls,Lipitor CVA with residual right-sided weakness on aspirin/Plavix, CAD, HLD, DM2, HTN, GERD, small cell lung cancer in remission s /p radiation/chemotherapy, asthma/COPD, anxiety/depression, chronic pain on long term opiates,  coming to [place] for fall.  Chart review shows patient had an upper endoscopy on December 25, 2022 showing:- Food in the mid esophagus.  Chart review shows patient had an upper endoscopy in November 2024 showing- Normal stomach. - Normal examined duodenum. - Normal gastroesophageal junction and esophagus.    Assessment & Plan:   Principal Problem:   Fall Active Problems:   NSTEMI (non-ST elevated myocardial infarction) (HCC)   COPD (chronic obstructive pulmonary disease) (HCC)   Type 2 diabetes mellitus (HCC)   Anemia   Glaucoma   Small cell lung cancer, right (HCC)  * Fall Patient reports multiple falls within the last several weeks.  Etiology unclear CT angio negative for PE Unable to rule out syncopal events Plan: Follow precautions.  Therapy evaluations completed.  Current recommendation for skilled nursing facility.  Patient currently long-term care resident at [facility].  Will need insurance authorization initiated for consideration for short-term rehab discharge.    NSTEMI (non-ST elevated myocardial infarction) [facility]) Slowly uptrending troponins Cardiology consulted Plan: Diagnostic catheterization 12/11 with angioplasty Uninterrupted DAPT x 12 months Possible repeat PCI in 2 to 3 weeks as outpatient  Small cell lung cancer, right [facility]) Outpatient follow-up   Glaucoma Resume home eyedrops.   Anemia Continue p.o. B12 supplement   Type 2 diabetes mellitus (HCC) Last A1c was a year ago at 6.9 and current regimen includes metformin and  NovoLog. We will cover patient with glycemic protocol currently.   COPD (chronic obstructive pulmonary disease) (HCC) Clinically stable no wheezing noted on exam no respiratory distress. Continue with patient's Trelegy Ellipta, Singulair, as needed albuterol.      DVT prophylaxis: SQ Lovenox Code Status: DNR Family Communication: None today Disposition Plan: Status is: Inpatient Remains inpatient appropriate because: Unsafe discharge plan.  Patient will need skilled nursing facility.  Insurance authorization initiated.   Level of care: Progressive  Consultants:  Cardiology  Procedures:  Left heart catheterization  Antimicrobials: None   Subjective: Seen and examined.  Lying in bed.  No visible distress.  Back pain improved.  Objective: Vitals:   01/09/23 0724 01/09/23 0806 01/09/23 1233 01/09/23 1401  BP:  111/80 92/81   Pulse:  (!) 110 99   Resp:  18 20   Temp:  98.2 F (36.8 C) 97.8 F (36.6 C)   TempSrc:  Oral Oral   SpO2: 97% 94% 100% 99%  Weight:      Height:        Intake/Output Summary (Last 24 hours) at 01/09/2023 1426 Last data filed at 01/09/2023 0600 Gross per 24 hour  Intake 1378.48 ml  Output 400 ml  Net 978.48 ml   Filed Weights   01/07/23 1354 01/08/23 1326  Weight: 81.6 kg 81.6 kg    Examination:  General exam: NAD.  Appears fatigued Respiratory system: Bibasilar crackles.  Normal work of breathing.  Room air Cardiovascular system: S1-2, RRR, no murmurs, no pedal edema Gastrointestinal system: Soft, T/ND, bowel sounds Central nervous system: Alert and oriented. No focal neurological deficits. Extremities: Symmetric 5 x 5 power. Skin: No rashes, lesions or ulcers Psychiatry: Judgement and insight  appear normal. Mood & affect appropriate.     Data Reviewed: I have personally reviewed following labs and imaging studies  CBC: Recent Labs  Lab 01/04/23 1638 01/07/23 1427 01/08/23 0328 01/09/23 0535  WBC 14.4* 7.7 6.3 7.4   HGB 12.5 11.1* 10.6* 10.8*  HCT 39.3 34.8* 33.2* 33.5*  MCV 84.5 85.3 83.8 82.7  PLT 202 227 199 207   Basic Metabolic Panel: Recent Labs  Lab 01/04/23 1638 01/07/23 1427 01/08/23 0328 01/09/23 0535  NA 135 134* 134* 135  K 3.9 4.0 4.1 3.8  CL 98 100 101 101  CO2 24 21* 24 24  GLUCOSE 189* 137* 147* 126*  BUN 12 14 16 9   CREATININE 0.76 0.92 0.89 0.65  CALCIUM 9.0 8.6* 8.5* 8.4*   GFR: Estimated Creatinine Clearance: 62.5 mL/min (by C-G formula based on SCr of 0.65 mg/dL). Liver Function Tests: Recent Labs  Lab 01/04/23 1638 01/07/23 1832  AST 31 29  ALT 19 16  ALKPHOS 78 80  BILITOT 1.4* 0.6  PROT 7.9 7.3  ALBUMIN 4.4 3.9   Recent Labs  Lab 01/04/23 1638  LIPASE 22   No results for input(s): "AMMONIA" in the last 168 hours. Coagulation Profile: Recent Labs  Lab 01/08/23 0134  INR 1.0   Cardiac Enzymes: Recent Labs  Lab 01/07/23 1832  CKTOTAL 163   BNP (last 3 results) No results for input(s): "PROBNP" in the last 8760 hours. HbA1C: Recent Labs    01/07/23 1424  HGBA1C 6.7*   CBG: Recent Labs  Lab 01/08/23 1352 01/08/23 1803 01/08/23 2325 01/09/23 0805 01/09/23 1235  GLUCAP 129* 121* 140* 124* 146*   Lipid Profile: Recent Labs    01/08/23 0328  CHOL 167  HDL 31*  LDLCALC UNABLE TO CALCULATE IF TRIGLYCERIDE OVER 400 mg/dL  TRIG 536*  CHOLHDL 5.4   Thyroid Function Tests: Recent Labs    01/08/23 0134  TSH 7.022*  FREET4 0.73   Anemia Panel: No results for input(s): "VITAMINB12", "FOLATE", "FERRITIN", "TIBC", "IRON", "RETICCTPCT" in the last 72 hours. Sepsis Labs: Recent Labs  Lab 01/08/23 0134  LATICACIDVEN 1.7    Recent Results (from the past 240 hours)  SARS Coronavirus 2 by RT PCR (hospital order, performed in [facility] hospital lab) *cepheid single result test* Anterior Nasal Swab     Status: None   Collection Time: 01/04/23  8:06 PM   Specimen: Anterior Nasal Swab  Result Value Ref Range Status   SARS  Coronavirus 2 by RT PCR NEGATIVE NEGATIVE Final    Comment: (NOTE) SARS-CoV-2 target nucleic acids are NOT DETECTED.  The SARS-CoV-2 RNA is generally detectable in upper and lower respiratory specimens during the acute phase of infection. The lowest concentration of SARS-CoV-2 viral copies this assay can detect is 250 copies / mL. A negative result does not preclude SARS-CoV-2 infection and should not be used as the sole basis for treatment or other patient management decisions.  A negative result may occur with improper specimen collection / handling, submission of specimen other than nasopharyngeal swab, presence of viral mutation(s) within the areas targeted by this assay, and inadequate number of viral copies (<250 copies / mL). A negative result must be combined with clinical observations, patient history, and epidemiological information.  Fact Sheet for Patients:   RoadLapTop.co.za  Fact Sheet for Healthcare Providers: http://kim-miller.com/  This test is not yet approved or  cleared by the [place] FDA and has been authorized for detection and/or diagnosis of SARS-CoV-2 by FDA under  an Emergency Use Authorization (EUA).  This EUA will remain in effect (meaning this test can be used) for the duration of the COVID-19 declaration under Section 564(b)(1) of the Act, 21 U.S.C. section 360bbb-3(b)(1), unless the authorization is terminated or revoked sooner.  Performed at [facility], [address], [place], [place] [address]          Radiology Studies: CARDIAC CATHETERIZATION Result Date: 01/09/2023   Mid LAD lesion is 95% stenosed.   Prox RCA lesion is 40% stenosed.   3rd Mrg lesion is 20% stenosed.   1st Mrg-1 lesion is 70% stenosed.   1st Mrg-2 lesion is 70% stenosed.   A stent was successfully placed.   Post intervention, there is a 0% residual stenosis.   Anticipated discharge date to be determined.    Recommend uninterrupted dual antiplatelet therapy with Aspirin 81mg  daily and Clopidogrel 75mg  daily for a minimum of 12 months (ACS-Class I recommendation). Conclusion Inpatient referred for intervention with high-grade lesion in mid LAD 95% Patient has additional disease in the distal circumflex which will be staged if she does not respond to medical therapy Patient was referred by Dr. Welton Flakes Successful PCI and stent of mid LAD Deployment of a 2.25 x 12 mm frontier Onyx to 12 atm Lesion reduced from 95 down to 0% TIMI-3 flow maintained throughout the case Continue aspirin Plavix Patient tolerated procedure well No complications Consider staged intervention of circumflex Patient to follow-up with primary cardiologist in 1 to 2 weeks   CT HEAD WO CONTRAST ( ) Result Date: 01/08/2023 CLINICAL DATA:  Initial evaluation for acute head trauma. Anticoagulated. EXAM: CT HEAD WITHOUT CONTRAST TECHNIQUE: Contiguous axial images were obtained from the base of the skull through the vertex without intravenous contrast. RADIATION DOSE REDUCTION: This exam was performed according to the departmental dose-optimization program which includes automated exposure control, adjustment of the mA and/or kV according to patient size and/or use of iterative reconstruction technique. COMPARISON:  Prior study from 01/07/2023 FINDINGS: Brain: Age-related cerebral atrophy with chronic microvascular ischemic disease. Chronic right occipital and right cerebellar infarcts. No acute intracranial hemorrhage. No acute large vessel territory infarct. No mass lesion or midline shift. No hydrocephalus or extra-axial fluid collection. Vascular: No abnormal hyperdense vessel. Scattered vascular calcifications noted within the carotid siphons. Skull: Small soft tissue contusion at the right parietal scalp. Calvarium intact. Sinuses/Orbits: Globes and orbital soft tissues within normal limits. Paranasal sinuses and mastoid air cells are largely clear.  Other: None. IMPRESSION: 1. No acute intracranial abnormality. 2. Small soft tissue contusion at the right parietal scalp. No underlying calvarial fracture. 3. Age-related cerebral atrophy with chronic microvascular ischemic disease, with chronic right occipital and right cerebellar infarcts. Electronically Signed   By: Rise Mu M.D.   On: 01/08/2023 20:37   CARDIAC CATHETERIZATION Result Date: 01/08/2023   Prox RCA lesion is 40% stenosed.   3rd Mrg lesion is 20% stenosed.   1st Mrg-1 lesion is 70% stenosed.   1st Mrg-2 lesion is 70% stenosed.   Mid LAD lesion is 95% stenosed.   LV end diastolic pressure is moderately elevated.   The left ventricular ejection fraction is greater than 65% by visual estimate.   Recommend dual antiplatelet therapy with Aspirin 81mg  daily and Ticagrelor 90mg  twice daily. Patient has two-vessel disease with critical mid LAD and high-grade lesion in OM1 in the proximal and midportion.  RCA is a small vessel and has mild disease in the midportion.  LVEF was normal about 65% but elevated  LVEDP of 27.  Normal wall motion.  Patient has a history of presyncope chest pain and elevated troponin 900 range all suggestive of possible culprit for the symptoms being mid LAD.  Will pursue PCI and stenting of the mid LAD and follow-up PCI and stenting in OM as an outpatient in 2 to 3 weeks.  Patient will be kept overnight and will be discharged either tomorrow or day after depending on how she does.   CT Angio Chest Pulmonary Embolism (PE) W or WO Contrast Result Date: 01/07/2023 CLINICAL DATA:  Recent syncopal episode, history of lung carcinoma, initial encounter EXAM: CT ANGIOGRAPHY CHEST WITH CONTRAST TECHNIQUE: Multidetector CT imaging of the chest was performed using the standard protocol during bolus administration of intravenous contrast. Multiplanar CT image reconstructions and MIPs were obtained to evaluate the vascular anatomy. RADIATION DOSE REDUCTION: This exam was  performed according to the departmental dose-optimization program which includes automated exposure control, adjustment of the mA and/or kV according to patient size and/or use of iterative reconstruction technique. CONTRAST:  75mL OMNIPAQUE IOHEXOL 350 MG/ML SOLN COMPARISON:  12/30/2022 FINDINGS: Cardiovascular: Thoracic aorta shows mild atherosclerotic calcifications. No aneurysmal dilatation or dissection is noted. No cardiac enlargement is seen. Coronary calcifications are noted. The pulmonary artery shows a normal branching pattern bilaterally. No filling defect to suggest pulmonary embolism is seen. Mediastinum/Nodes: Thoracic inlet is within normal limits. No hilar or mediastinal adenopathy is noted. Stable distal paraesophageal node is noted measuring 8 mm in short axis. Lungs/Pleura: Lungs are well aerated bilaterally. Diffuse emphysematous changes are seen. No acute infiltrate is seen. Post radiation changes are noted in the medial aspect of the right upper and lower lobes stable from the prior study. No sizable effusion is seen. No parenchymal nodules are noted. Upper Abdomen: Visualized upper abdomen shows e acute abnormality. Musculoskeletal: Mild degenerative changes of the thoracic spine are noted. No acute rib abnormality is seen. Review of the MIP images confirms the above findings. IMPRESSION: No evidence of pulmonary emboli. Post radiation changes in the medial right hemithorax. These are stable from the prior exam. Stable distal paraesophageal lymph node. Aortic Atherosclerosis (ICD10-I70.0). Electronically Signed   By: Alcide Clever M.D.   On: 01/07/2023 20:55   MR BRAIN WO CONTRAST Result Date: 01/07/2023 CLINICAL DATA:  Neuro deficit with acute stroke suspected. EXAM: MRI HEAD WITHOUT CONTRAST TECHNIQUE: Multiplanar, multiecho pulse sequences of the brain and surrounding structures were obtained without intravenous contrast. COMPARISON:  Head CT 01/04/2023 FINDINGS: Brain: No acute  infarction, hemorrhage, hydrocephalus, extra-axial collection or mass lesion. Extensive chronic infarction in the right cerebellum and to a lesser extent in the right occipital lobe. Ischemic gliosis in the periventricular white matter is mild for age. Mild cerebral volume loss, more pronounced volume loss in the posterior fossa, stable from a 2022 brain MRI. Vascular: Major flow voids are stable, including diminutive right vertebral artery Skull and upper cervical spine: Normal marrow signal Sinuses/Orbits: Unremarkable IMPRESSION: 1. No acute finding. 2. Chronic right cerebellar and right occipital infarcts. Electronically Signed   By: Tiburcio Pea M.D.   On: 01/07/2023 19:43        Scheduled Meds:  albuterol  2.5 mg Nebulization Q6H   aspirin EC  81 mg Oral Daily   busPIRone  5 mg Oral TID   clopidogrel  75 mg Oral Q breakfast   diclofenac Sodium  4 g Topical QID   fluticasone furoate-vilanterol  1 puff Inhalation Daily   And   umeclidinium bromide  1  puff Inhalation Daily   insulin aspart  0-15 Units Subcutaneous TID WC   insulin aspart  0-5 Units Subcutaneous QHS   levothyroxine  50 mcg Oral Q0600   metoprolol succinate  50 mg Oral Daily   sertraline  50 mg Oral Daily   sodium chloride flush  3 mL Intravenous Q12H   sodium chloride flush  3 mL Intravenous Q12H   Continuous Infusions:     LOS: 2 days   Tresa Moore, MD [facility]   If 7PM-7AM, please contact night-coverage  01/09/2023, 2:26 PM

## 2023-01-10 ENCOUNTER — Encounter: Payer: Self-pay | Admitting: Cardiovascular Disease

## 2023-01-10 DIAGNOSIS — I214 Non-ST elevation (NSTEMI) myocardial infarction: Secondary | ICD-10-CM | POA: Diagnosis not present

## 2023-01-10 DIAGNOSIS — W19XXXD Unspecified fall, subsequent encounter: Secondary | ICD-10-CM | POA: Diagnosis not present

## 2023-01-10 LAB — GLUCOSE, CAPILLARY
Glucose-Capillary: 148 mg/dL — ABNORMAL HIGH (ref 70–99)
Glucose-Capillary: 174 mg/dL — ABNORMAL HIGH (ref 70–99)
Glucose-Capillary: 213 mg/dL — ABNORMAL HIGH (ref 70–99)
Glucose-Capillary: 116 mg/dL — ABNORMAL HIGH (ref 70–99)

## 2023-01-10 MED ORDER — MELATONIN 5 MG PO TABS
5.0000 mg | ORAL_TABLET | Freq: Once | ORAL | Status: AC
  Administered 2023-01-10: 5 mg via ORAL
  Filled 2023-01-10: qty 1

## 2023-01-10 MED ORDER — ALBUTEROL SULFATE (2.5 MG/3ML) 0.083% IN NEBU: 2.5000 mg | INHALATION_SOLUTION | Freq: Four times a day (QID) | RESPIRATORY_TRACT | Status: DC | PRN

## 2023-01-10 NOTE — Progress Notes (Signed)
PROGRESS NOTE    Yvette Riley  WUJ:[id number] DOB: 07-Oct-1955 DOA: 01/07/2023 PCP: Sherol Dade, DO    Brief Narrative:   67 y.o. female with medical history significant for frequent falls,Lipitor CVA with residual right-sided weakness on aspirin/Plavix, CAD, HLD, DM2, HTN, GERD, small cell lung cancer in remission s /p radiation/chemotherapy, asthma/COPD, anxiety/depression, chronic pain on long term opiates,  coming to [place] for fall.  Chart review shows patient had an upper endoscopy on December 25, 2022 showing:- Food in the mid esophagus.  Chart review shows patient had an upper endoscopy in November 2024 showing- Normal stomach. - Normal examined duodenum. - Normal gastroesophageal junction and esophagus.    Assessment & Plan:   Principal Problem:   Fall Active Problems:   NSTEMI (non-ST elevated myocardial infarction) (HCC)   COPD (chronic obstructive pulmonary disease) (HCC)   Type 2 diabetes mellitus (HCC)   Anemia   Glaucoma   Small cell lung cancer, right (HCC)  * Fall Patient reports multiple falls within the last several weeks.  Etiology unclear CT angio negative for PE Unable to rule out syncopal events Plan: Fall precautions.  Therapy evaluations.  Current recommendation for skilled nursing facility.  Will need to initiation of insurance authorization in order for consideration for SNF discharge    NSTEMI (non-ST elevated myocardial infarction) [facility]) Slowly uptrending troponins Cardiology consulted Plan: Diagnostic catheterization 12/11 with angioplasty Uninterrupted DAPT x 12 months Possible repeat PCI in 2 to 3 weeks as outpatient Cleared for discharge from cardiology standpoint  Small cell lung cancer, right [facility]) Outpatient follow-up   Glaucoma Resume home eyedrops.   Anemia Continue p.o. B12 supplement   Type 2 diabetes mellitus (HCC) Last A1c was a year ago at 6.9 and current regimen includes metformin and NovoLog. We will cover  patient with glycemic protocol currently.   COPD (chronic obstructive pulmonary disease) (HCC) Clinically stable no wheezing noted on exam no respiratory distress. Continue with patient's Trelegy Ellipta, Singulair, as needed albuterol.  Obesity BMI 37.6.  Complicating factor in overall care and prognosis.      DVT prophylaxis: SQ Lovenox Code Status: DNR Family Communication: None today Disposition Plan: Status is: Inpatient Remains inpatient appropriate because: Unsafe discharge plan.  Patient will need skilled nursing facility.  Insurance authorization initiated.  Medically appropriate for discharge to SNF   Level of care: Progressive  Consultants:  Cardiology  Procedures:  Left heart catheterization  Antimicrobials: None   Subjective: Seen and examined.  Lying in bed.  No distress.  No complaints  Objective: Vitals:   01/09/23 2046 01/10/23 0004 01/10/23 0324 01/10/23 0828  BP: 116/75 111/84 120/81 113/76  Pulse: (!) 103 (!) 107 (!) 113 (!) 104  Resp: 18 19  15   Temp: 98 F (36.7 C) 98.1 F (36.7 C) 98.3 F (36.8 C) (!) 97.3 F (36.3 C)  TempSrc: Oral     SpO2: 97% 98% 96% 95%  Weight:      Height:        Intake/Output Summary (Last 24 hours) at 01/10/2023 1248 Last data filed at 01/10/2023 1100 Gross per 24 hour  Intake 730 ml  Output 250 ml  Net 480 ml   Filed Weights   01/07/23 1354 01/08/23 1326  Weight: 81.6 kg 81.6 kg    Examination:  General exam: No acute distress Respiratory system: Bibasilar crackles.  Normal work of breathing.  Room air Cardiovascular system: S1-2, RRR, no murmurs, no pedal edema Gastrointestinal system: Obese, soft, NT/ND, normal bowel sounds  Central nervous system: Alert and oriented. No focal neurological deficits. Extremities: Symmetric 5 x 5 power. Skin: No rashes, lesions or ulcers Psychiatry: Judgement and insight appear normal. Mood & affect appropriate.     Data Reviewed: I have personally reviewed  following labs and imaging studies  CBC: Recent Labs  Lab 01/04/23 1638 01/07/23 1427 01/08/23 0328 01/09/23 0535  WBC 14.4* 7.7 6.3 7.4  HGB 12.5 11.1* 10.6* 10.8*  HCT 39.3 34.8* 33.2* 33.5*  MCV 84.5 85.3 83.8 82.7  PLT 202 227 199 207   Basic Metabolic Panel: Recent Labs  Lab 01/04/23 1638 01/07/23 1427 01/08/23 0328 01/09/23 0535  NA 135 134* 134* 135  K 3.9 4.0 4.1 3.8  CL 98 100 101 101  CO2 24 21* 24 24  GLUCOSE 189* 137* 147* 126*  BUN 12 14 16 9   CREATININE 0.76 0.92 0.89 0.65  CALCIUM 9.0 8.6* 8.5* 8.4*   GFR: Estimated Creatinine Clearance: 62.5 mL/min (by C-G formula based on SCr of 0.65 mg/dL). Liver Function Tests: Recent Labs  Lab 01/04/23 1638 01/07/23 1832  AST 31 29  ALT 19 16  ALKPHOS 78 80  BILITOT 1.4* 0.6  PROT 7.9 7.3  ALBUMIN 4.4 3.9   Recent Labs  Lab 01/04/23 1638  LIPASE 22   No results for input(s): "AMMONIA" in the last 168 hours. Coagulation Profile: Recent Labs  Lab 01/08/23 0134  INR 1.0   Cardiac Enzymes: Recent Labs  Lab 01/07/23 1832  CKTOTAL 163   BNP (last 3 results) No results for input(s): "PROBNP" in the last 8760 hours. HbA1C: Recent Labs    01/07/23 1424  HGBA1C 6.7*   CBG: Recent Labs  Lab 01/09/23 0805 01/09/23 1235 01/09/23 1637 01/09/23 2116 01/10/23 0830  GLUCAP 124* 146* 137* 170* 148*   Lipid Profile: Recent Labs    01/08/23 0328  CHOL 167  HDL 31*  LDLCALC UNABLE TO CALCULATE IF TRIGLYCERIDE OVER 400 mg/dL  TRIG 841*  CHOLHDL 5.4  LDLDIRECT 81   Thyroid Function Tests: Recent Labs    01/08/23 0134  TSH 7.022*  FREET4 0.73   Anemia Panel: No results for input(s): "VITAMINB12", "FOLATE", "FERRITIN", "TIBC", "IRON", "RETICCTPCT" in the last 72 hours. Sepsis Labs: Recent Labs  Lab 01/08/23 0134  LATICACIDVEN 1.7    Recent Results (from the past 240 hours)  SARS Coronavirus 2 by RT PCR (hospital order, performed in [facility] hospital lab) *cepheid single result  test* Anterior Nasal Swab     Status: None   Collection Time: 01/04/23  8:06 PM   Specimen: Anterior Nasal Swab  Result Value Ref Range Status   SARS Coronavirus 2 by RT PCR NEGATIVE NEGATIVE Final    Comment: (NOTE) SARS-CoV-2 target nucleic acids are NOT DETECTED.  The SARS-CoV-2 RNA is generally detectable in upper and lower respiratory specimens during the acute phase of infection. The lowest concentration of SARS-CoV-2 viral copies this assay can detect is 250 copies / mL. A negative result does not preclude SARS-CoV-2 infection and should not be used as the sole basis for treatment or other patient management decisions.  A negative result may occur with improper specimen collection / handling, submission of specimen other than nasopharyngeal swab, presence of viral mutation(s) within the areas targeted by this assay, and inadequate number of viral copies (<250 copies / mL). A negative result must be combined with clinical observations, patient history, and epidemiological information.  Fact Sheet for Patients:   RoadLapTop.co.za  Fact Sheet for Healthcare Providers:  http://kim-miller.com/  This test is not yet approved or  cleared by the [place] and has been authorized for detection and/or diagnosis of SARS-CoV-2 by FDA under an Emergency Use Authorization (EUA).  This EUA will remain in effect (meaning this test can be used) for the duration of the COVID-19 declaration under Section 564(b)(1) of the Act, 21 U.S.C. section 360bbb-3(b)(1), unless the authorization is terminated or revoked sooner.  Performed at [facility], [address], [place], [place] [address]          Radiology Studies: CARDIAC CATHETERIZATION Result Date: 01/09/2023   Mid LAD lesion is 95% stenosed.   Prox RCA lesion is 40% stenosed.   3rd Mrg lesion is 20% stenosed.   1st Mrg-1 lesion is 70% stenosed.   1st Mrg-2 lesion is 70%  stenosed.   A drug-eluting stent was successfully placed using a STENT ONYX FRONTIER 2.25X12.   Post intervention, there is a 0% residual stenosis.   Anticipated discharge date to be determined.   Recommend uninterrupted dual antiplatelet therapy with Aspirin 81mg  daily and Clopidogrel 75mg  daily for a minimum of 12 months (ACS-Class I recommendation). Conclusion Inpatient referred for intervention with high-grade lesion in mid LAD 95% Patient has additional disease in the distal circumflex which will be staged if she does not respond to medical therapy Patient was referred by Dr. Welton Flakes Successful PCI and stent of mid LAD Deployment of a 2.25 x 12 mm frontier Onyx to 12 atm Lesion reduced from 95 down to 0% TIMI-3 flow maintained throughout the case Continue aspirin Plavix Patient tolerated procedure well No complications Consider staged intervention of circumflex Patient to follow-up with primary cardiologist in 1 to 2 weeks   CT HEAD WO CONTRAST ( ) Result Date: 01/08/2023 CLINICAL DATA:  Initial evaluation for acute head trauma. Anticoagulated. EXAM: CT HEAD WITHOUT CONTRAST TECHNIQUE: Contiguous axial images were obtained from the base of the skull through the vertex without intravenous contrast. RADIATION DOSE REDUCTION: This exam was performed according to the departmental dose-optimization program which includes automated exposure control, adjustment of the mA and/or kV according to patient size and/or use of iterative reconstruction technique. COMPARISON:  Prior study from 01/07/2023 FINDINGS: Brain: Age-related cerebral atrophy with chronic microvascular ischemic disease. Chronic right occipital and right cerebellar infarcts. No acute intracranial hemorrhage. No acute large vessel territory infarct. No mass lesion or midline shift. No hydrocephalus or extra-axial fluid collection. Vascular: No abnormal hyperdense vessel. Scattered vascular calcifications noted within the carotid siphons. Skull: Small  soft tissue contusion at the right parietal scalp. Calvarium intact. Sinuses/Orbits: Globes and orbital soft tissues within normal limits. Paranasal sinuses and mastoid air cells are largely clear. Other: None. IMPRESSION: 1. No acute intracranial abnormality. 2. Small soft tissue contusion at the right parietal scalp. No underlying calvarial fracture. 3. Age-related cerebral atrophy with chronic microvascular ischemic disease, with chronic right occipital and right cerebellar infarcts. Electronically Signed   By: Rise Mu M.D.   On: 01/08/2023 20:37   CARDIAC CATHETERIZATION Result Date: 01/08/2023   Prox RCA lesion is 40% stenosed.   3rd Mrg lesion is 20% stenosed.   1st Mrg-1 lesion is 70% stenosed.   1st Mrg-2 lesion is 70% stenosed.   Mid LAD lesion is 95% stenosed.   LV end diastolic pressure is moderately elevated.   The left ventricular ejection fraction is greater than 65% by visual estimate.   Recommend dual antiplatelet therapy with Aspirin 81mg  daily and Ticagrelor 90mg  twice daily. Patient has two-vessel  disease with critical mid LAD and high-grade lesion in OM1 in the proximal and midportion.  RCA is a small vessel and has mild disease in the midportion.  LVEF was normal about 65% but elevated LVEDP of 27.  Normal wall motion.  Patient has a history of presyncope chest pain and elevated troponin 900 range all suggestive of possible culprit for the symptoms being mid LAD.  Will pursue PCI and stenting of the mid LAD and follow-up PCI and stenting in OM as an outpatient in 2 to 3 weeks.  Patient will be kept overnight and will be discharged either tomorrow or day after depending on how she does.        Scheduled Meds:  aspirin EC  81 mg Oral Daily   busPIRone  5 mg Oral TID   clopidogrel  75 mg Oral Q breakfast   diclofenac Sodium  4 g Topical QID   enoxaparin (LOVENOX) injection  40 mg Subcutaneous Q24H   fluticasone furoate-vilanterol  1 puff Inhalation Daily   And    umeclidinium bromide  1 puff Inhalation Daily   insulin aspart  0-15 Units Subcutaneous TID WC   insulin aspart  0-5 Units Subcutaneous QHS   levothyroxine  50 mcg Oral Q0600   metoprolol succinate  50 mg Oral Daily   sertraline  50 mg Oral Daily   sodium chloride flush  3 mL Intravenous Q12H   sodium chloride flush  3 mL Intravenous Q12H   Continuous Infusions:     LOS: 3 days   Tresa Moore, MD [facility]   If 7PM-7AM, please contact night-coverage  01/10/2023, 12:48 PM

## 2023-01-10 NOTE — Care Management Important Message (Signed)
Important Message  Patient Details  Name: Yvette Riley MRN: [id number] Date of Birth: 1955/11/16   Important Message Given:  N/A - LOS <3 / Initial given by admissions     [name] 01/10/2023, 10:42 AM

## 2023-01-10 NOTE — Plan of Care (Signed)
  Problem: Coping: Goal: Ability to adjust to condition or change in health will improve Outcome: Progressing   Problem: Fluid Volume: Goal: Ability to maintain a balanced intake and output will improve Outcome: Progressing   Problem: Nutritional: Goal: Maintenance of adequate nutrition will improve Outcome: Progressing   Problem: Skin Integrity: Goal: Risk for impaired skin integrity will decrease Outcome: Progressing   Problem: Activity: Goal: Ability to tolerate increased activity will improve Outcome: Progressing

## 2023-01-11 DIAGNOSIS — I214 Non-ST elevation (NSTEMI) myocardial infarction: Secondary | ICD-10-CM | POA: Diagnosis not present

## 2023-01-11 DIAGNOSIS — W19XXXD Unspecified fall, subsequent encounter: Secondary | ICD-10-CM | POA: Diagnosis not present

## 2023-01-11 LAB — CBC WITH DIFFERENTIAL/PLATELET
Abs Immature Granulocytes: 0.07 10*3/uL (ref 0.00–0.07)
Basophils Absolute: 0 10*3/uL (ref 0.0–0.1)
Basophils Relative: 1 %
Eosinophils Absolute: 0.4 10*3/uL (ref 0.0–0.5)
Eosinophils Relative: 5 %
HCT: 34 % — ABNORMAL LOW (ref 36.0–46.0)
Hemoglobin: 10.7 g/dL — ABNORMAL LOW (ref 12.0–15.0)
Immature Granulocytes: 1 %
Lymphocytes Relative: 17 %
Lymphs Abs: 1.3 10*3/uL (ref 0.7–4.0)
MCH: 26.8 pg (ref 26.0–34.0)
MCHC: 31.5 g/dL (ref 30.0–36.0)
MCV: 85.2 fL (ref 80.0–100.0)
Monocytes Absolute: 0.4 10*3/uL (ref 0.1–1.0)
Monocytes Relative: 6 %
Neutro Abs: 5.3 10*3/uL (ref 1.7–7.7)
Neutrophils Relative %: 70 %
Platelets: 226 10*3/uL (ref 150–400)
RBC: 3.99 MIL/uL (ref 3.87–5.11)
RDW: 14.1 % (ref 11.5–15.5)
WBC: 7.4 10*3/uL (ref 4.0–10.5)
nRBC: 0 % (ref 0.0–0.2)

## 2023-01-11 LAB — LIPOPROTEIN A (LPA): Lipoprotein (a): 67.9 nmol/L — ABNORMAL HIGH (ref <75.0)

## 2023-01-11 LAB — BASIC METABOLIC PANEL
Anion gap: 11 (ref 5–15)
BUN: 15 mg/dL (ref 8–23)
CO2: 26 mmol/L (ref 22–32)
Calcium: 9.1 mg/dL (ref 8.9–10.3)
Chloride: 99 mmol/L (ref 98–111)
Creatinine, Ser: 0.75 mg/dL (ref 0.44–1.00)
GFR, Estimated: >60 mL/min (ref >60)
Glucose, Bld: 142 mg/dL — ABNORMAL HIGH (ref 70–99)
Potassium: 4 mmol/L (ref 3.5–5.1)
Sodium: 136 mmol/L (ref 135–145)

## 2023-01-11 LAB — LDL CHOLESTEROL, DIRECT: Direct LDL: 81 mg/dL (ref 0–99)

## 2023-01-11 LAB — GLUCOSE, CAPILLARY
Glucose-Capillary: 126 mg/dL — ABNORMAL HIGH (ref 70–99)
Glucose-Capillary: 185 mg/dL — ABNORMAL HIGH (ref 70–99)
Glucose-Capillary: 178 mg/dL — ABNORMAL HIGH (ref 70–99)
Glucose-Capillary: 124 mg/dL — ABNORMAL HIGH (ref 70–99)

## 2023-01-11 NOTE — Plan of Care (Signed)
  Problem: Pain Management: Goal: General experience of comfort will improve Outcome: Progressing

## 2023-01-11 NOTE — Progress Notes (Signed)
PROGRESS NOTE    Yvette Riley  EVO:[id number] DOB: 11/20/1955 DOA: 01/07/2023 PCP: Sherol Dade, DO    Brief Narrative:   67 y.o. female with medical history significant for frequent falls,Lipitor CVA with residual right-sided weakness on aspirin/Plavix, CAD, HLD, DM2, HTN, GERD, small cell lung cancer in remission s /p radiation/chemotherapy, asthma/COPD, anxiety/depression, chronic pain on long term opiates,  coming to [place] for fall.  Chart review shows patient had an upper endoscopy on December 25, 2022 showing:- Food in the mid esophagus.  Chart review shows patient had an upper endoscopy in November 2024 showing- Normal stomach. - Normal examined duodenum. - Normal gastroesophageal junction and esophagus.    Assessment & Plan:   Principal Problem:   Fall Active Problems:   NSTEMI (non-ST elevated myocardial infarction) (HCC)   COPD (chronic obstructive pulmonary disease) (HCC)   Type 2 diabetes mellitus (HCC)   Anemia   Glaucoma   Small cell lung cancer, right (HCC)  Fall Patient reports multiple falls within the last several weeks.  Etiology unclear CT angio negative for PE Unable to rule out syncopal events Plan: Fall precautions.  Therapy evaluations.  Current recommendation for skilled nursing facility.  PT contacted to reevaluate today in order for [place] to initiate insurance authorization.    NSTEMI (non-ST elevated myocardial infarction) [facility]) Slowly uptrending troponins Cardiology consulted Plan: Diagnostic catheterization 12/11 with angioplasty Uninterrupted DAPT x 12 months Possible repeat PCI in 2 to 3 weeks as outpatient Cleared for discharge from cardiology standpoint  Small cell lung cancer, right [facility]) Outpatient follow-up   Glaucoma Continue home ophthalmic gtt.   Anemia Continue p.o. B12 supplement   Type 2 diabetes mellitus (HCC) Last A1c was a year ago at 6.9 and current regimen includes metformin and NovoLog. We will cover patient  with glycemic protocol currently.   COPD (chronic obstructive pulmonary disease) (HCC) Clinically stable no wheezing noted on exam no respiratory distress. Continue with patient's Trelegy Ellipta, Singulair, as needed albuterol.  Obesity BMI 37.6.  Complicating factor in overall care and prognosis.      DVT prophylaxis: SQ Lovenox Code Status: DNR Family Communication: None today Disposition Plan: Status is: Inpatient Remains inpatient appropriate because: Unsafe discharge plan.  Patient will need skilled nursing facility.  Insurance authorization initiated.  Medically appropriate for discharge to SNF   Level of care: Progressive  Consultants:  Cardiology  Procedures:  Left heart catheterization  Antimicrobials: None   Subjective: Seen and examined.  Resting comfortably in bed.  No distress.  No complaints of pain.  Objective: Vitals:   01/10/23 2337 01/11/23 0413 01/11/23 0756 01/11/23 1214  BP: 108/71 106/78 110/61 121/73  Pulse: 91 (!) 108 89 92  Resp: 18 20 16 16   Temp: 97.7 F (36.5 C) 97.8 F (36.6 C) 98 F (36.7 C) 98.5 F (36.9 C)  TempSrc:      SpO2: 98% 97% 97% 95%  Weight:      Height:        Intake/Output Summary (Last 24 hours) at 01/11/2023 1253 Last data filed at 01/11/2023 0828 Gross per 24 hour  Intake 273 ml  Output 200 ml  Net 73 ml   Filed Weights   01/07/23 1354 01/08/23 1326  Weight: 81.6 kg 81.6 kg    Examination:  General exam: No apparent distress Respiratory system: Lungs clear.  Normal work of breathing.  Room air Cardiovascular system: S1-2, RRR, no murmurs, no pedal edema Gastrointestinal system: Obese, soft, NT/ND, normal bowel sounds Central nervous  system: Alert and oriented. No focal neurological deficits. Extremities: Symmetric 5 x 5 power. Skin: No rashes, lesions or ulcers Psychiatry: Judgement and insight appear normal. Mood & affect appropriate.     Data Reviewed: I have personally reviewed following  labs and imaging studies  CBC: Recent Labs  Lab 01/04/23 1638 01/07/23 1427 01/08/23 0328 01/09/23 0535 01/11/23 0838  WBC 14.4* 7.7 6.3 7.4 7.4  NEUTROABS  --   --   --   --  5.3  HGB 12.5 11.1* 10.6* 10.8* 10.7*  HCT 39.3 34.8* 33.2* 33.5* 34.0*  MCV 84.5 85.3 83.8 82.7 85.2  PLT 202 227 199 207 226   Basic Metabolic Panel: Recent Labs  Lab 01/04/23 1638 01/07/23 1427 01/08/23 0328 01/09/23 0535 01/11/23 0838  NA 135 134* 134* 135 136  K 3.9 4.0 4.1 3.8 4.0  CL 98 100 101 101 99  CO2 24 21* 24 24 26   GLUCOSE 189* 137* 147* 126* 142*  BUN 12 14 16 9 15   CREATININE 0.76 0.92 0.89 0.65 0.75  CALCIUM 9.0 8.6* 8.5* 8.4* 9.1   GFR: Estimated Creatinine Clearance: 62.5 mL/min (by C-G formula based on SCr of 0.75 mg/dL). Liver Function Tests: Recent Labs  Lab 01/04/23 1638 01/07/23 1832  AST 31 29  ALT 19 16  ALKPHOS 78 80  BILITOT 1.4* 0.6  PROT 7.9 7.3  ALBUMIN 4.4 3.9   Recent Labs  Lab 01/04/23 1638  LIPASE 22   No results for input(s): "AMMONIA" in the last 168 hours. Coagulation Profile: Recent Labs  Lab 01/08/23 0134  INR 1.0   Cardiac Enzymes: Recent Labs  Lab 01/07/23 1832  CKTOTAL 163   BNP (last 3 results) No results for input(s): "PROBNP" in the last 8760 hours. HbA1C: No results for input(s): "HGBA1C" in the last 72 hours.  CBG: Recent Labs  Lab 01/10/23 1325 01/10/23 1603 01/10/23 2100 01/11/23 0758 01/11/23 1216  GLUCAP 174* 213* 116* 126* 185*   Lipid Profile: No results for input(s): "CHOL", "HDL", "LDLCALC", "TRIG", "CHOLHDL", "LDLDIRECT" in the last 72 hours.  Thyroid Function Tests: No results for input(s): "TSH", "T4TOTAL", "FREET4", "T3FREE", "THYROIDAB" in the last 72 hours.  Anemia Panel: No results for input(s): "VITAMINB12", "FOLATE", "FERRITIN", "TIBC", "IRON", "RETICCTPCT" in the last 72 hours. Sepsis Labs: Recent Labs  Lab 01/08/23 0134  LATICACIDVEN 1.7    Recent Results (from the past 240 hours)   SARS Coronavirus 2 by RT PCR (hospital order, performed in [facility] hospital lab) *cepheid single result test* Anterior Nasal Swab     Status: None   Collection Time: 01/04/23  8:06 PM   Specimen: Anterior Nasal Swab  Result Value Ref Range Status   SARS Coronavirus 2 by RT PCR NEGATIVE NEGATIVE Final    Comment: (NOTE) SARS-CoV-2 target nucleic acids are NOT DETECTED.  The SARS-CoV-2 RNA is generally detectable in upper and lower respiratory specimens during the acute phase of infection. The lowest concentration of SARS-CoV-2 viral copies this assay can detect is 250 copies / mL. A negative result does not preclude SARS-CoV-2 infection and should not be used as the sole basis for treatment or other patient management decisions.  A negative result may occur with improper specimen collection / handling, submission of specimen other than nasopharyngeal swab, presence of viral mutation(s) within the areas targeted by this assay, and inadequate number of viral copies (<250 copies / mL). A negative result must be combined with clinical observations, patient history, and epidemiological information.  Fact Sheet  for Patients:   RoadLapTop.co.za  Fact Sheet for Healthcare Providers: http://kim-miller.com/  This test is not yet approved or  cleared by the [place] FDA and has been authorized for detection and/or diagnosis of SARS-CoV-2 by FDA under an Emergency Use Authorization (EUA).  This EUA will remain in effect (meaning this test can be used) for the duration of the COVID-19 declaration under Section 564(b)(1) of the Act, 21 U.S.C. section 360bbb-3(b)(1), unless the authorization is terminated or revoked sooner.  Performed at [facility], [address] [place], [place] [address]          Radiology Studies: No results found.       Scheduled Meds:  aspirin EC  81 mg Oral Daily   busPIRone  5 mg Oral TID    clopidogrel  75 mg Oral Q breakfast   diclofenac Sodium  4 g Topical QID   enoxaparin (LOVENOX) injection  40 mg Subcutaneous Q24H   fluticasone furoate-vilanterol  1 puff Inhalation Daily   And   umeclidinium bromide  1 puff Inhalation Daily   insulin aspart  0-15 Units Subcutaneous TID WC   insulin aspart  0-5 Units Subcutaneous QHS   levothyroxine  50 mcg Oral Q0600   metoprolol succinate  50 mg Oral Daily   sertraline  50 mg Oral Daily   sodium chloride flush  3 mL Intravenous Q12H   sodium chloride flush  3 mL Intravenous Q12H   Continuous Infusions:     LOS: 4 days   Tresa Moore, MD [facility]   If 7PM-7AM, please contact night-coverage  01/11/2023, 12:53 PM

## 2023-01-11 NOTE — Progress Notes (Signed)
Physical Therapy Treatment Patient Details Name: Yvette Riley MRN: [id number] DOB: Aug 28, 1955 Today's Date: 01/11/2023   History of Present Illness Yvette Riley is a 67 y.o. female with medical history significant for frequent falls, allergy to penicillin, yellowjacket venom, codeine, Crestor, gemfibrozil, trazodone, Lipitor CVA with residual right-sided weakness on aspirin/Plavix, CAD, HLD, DM2, HTN, GERD, small cell lung cancer in remission s /p radiation/chemotherapy, asthma/COPD, anxiety/depression, chronic pain on long term opiates, coming to [place] for fall. Patient is s/p cardiac cath 01/08/23.    PT Comments  Pt was long sitting in bed, alert and O x 3. Agrees to session. Pt slightly SOB at rest but on rm air with sao2 97%. Sao2 maintained > 94% on rm air throughout session. HR < 100 bpm even with OOB activity. Pt was able to safely exit bed, stand to RW (required assistance to don pants), and tolerate ambulate 1 x 5 ft then 1 x 20 ft. She does have one episode of knee buckling with min assist intervention to prevent fall. Overall she tolerates session well and is progressing. Dc recs remain appropriate to maximize her safety and independence with all ADLs while decreasing fall risk. Pt has had fall during this admission and endorses frequent falls over past year.      If plan is discharge home, recommend the following: A little help with walking and/or transfers;A little help with bathing/dressing/bathroom;Assistance with cooking/housework;Direct supervision/assist for medications management;Direct supervision/assist for financial management;Assist for transportation;Help with stairs or ramp for entrance     Equipment Recommendations  Other (comment) (Defer to next level of care)       Precautions / Restrictions Precautions Precautions: Fall Restrictions Weight Bearing Restrictions Per Provider Order: No     Mobility  Bed Mobility Overal bed mobility: Needs Assistance Bed  Mobility: Supine to Sit  Supine to sit: Supervision  General bed mobility comments: no physical assistance to exit R side of bed.    Transfers Overall transfer level: Needs assistance Equipment used: Rolling walker (2 wheels) Transfers: Sit to/from Stand Sit to Stand: Contact guard assist  General transfer comment: Pt requires CGA for safety to stand form lowest bed height. Author lowered RW however pt wold benefit form youth RW    Ambulation/Gait Ambulation/Gait assistance: Contact guard assist, Min assist Gait Distance (Feet): 20 Feet Assistive device: Rolling walker (2 wheels) Gait Pattern/deviations: Step-to pattern, Knees buckling Gait velocity: decreased  General Gait Details: Pt ambulated 1 x 5 ft then 1 x 20 ft with use of RW. distance limited by back pain but overall tolerates well. One small episode of knee buckling with min assist to prevent fall    Balance Overall balance assessment: Needs assistance, History of Falls Sitting-balance support: Feet supported Sitting balance-Leahy Scale: Good     Standing balance support: Bilateral upper extremity supported, During functional activity Standing balance-Leahy Scale: Fair       Cognition Arousal: Alert Behavior During Therapy: WFL for tasks assessed/performed Overall Cognitive Status: Within Functional Limits for tasks assessed      General Comments: Pt is A and O x 3. remains cooperative throughout. Follows commands consistently throughout session               Pertinent Vitals/Pain Pain Assessment Pain Assessment: 0-10 Pain Score: 4  Pain Location: back, she states is chronic Pain Descriptors / Indicators: Discomfort, Grimacing, Moaning Pain Intervention(s): Limited activity within patient's tolerance, Monitored during session, Premedicated before session, Repositioned     PT Goals (current goals can  now be found in the care plan section) Acute Rehab PT Goals Patient Stated Goal: get back to [place] Progress towards PT goals: Progressing toward goals    Frequency    Min 1X/week       AM-PAC PT "6 Clicks" Mobility   Outcome Measure  Help needed turning from your back to your side while in a flat bed without using bedrails?: A Little Help needed moving from lying on your back to sitting on the side of a flat bed without using bedrails?: A Little Help needed moving to and from a bed to a chair (including a wheelchair)?: A Little Help needed standing up from a chair using your arms (e.g., wheelchair or bedside chair)?: A Little Help needed to walk in hospital room?: A Little Help needed climbing 3-5 steps with a railing? : A Lot 6 Click Score: 17    End of Session   Activity Tolerance: Patient tolerated treatment well Patient left: in chair;with call bell/phone within reach;with chair alarm set Nurse Communication: Mobility status PT Visit Diagnosis: Other abnormalities of gait and mobility (R26.89);Muscle weakness (generalized) (M62.81);Repeated falls (R29.6);Difficulty in walking, not elsewhere classified (R26.2);Pain     Time: 1259-1316 PT Time Calculation (min) (ACUTE ONLY): 17 min  Charges:    $Gait Training: 8-22 mins PT General Charges $$ ACUTE PT VISIT: 1 Visit                     Jetta Lout PTA 01/11/23, 1:33 PM

## 2023-01-12 ENCOUNTER — Inpatient Hospital Stay: Payer: 59

## 2023-01-12 DIAGNOSIS — I214 Non-ST elevation (NSTEMI) myocardial infarction: Secondary | ICD-10-CM | POA: Diagnosis not present

## 2023-01-12 DIAGNOSIS — C3491 Malignant neoplasm of unspecified part of right bronchus or lung: Secondary | ICD-10-CM

## 2023-01-12 DIAGNOSIS — W19XXXD Unspecified fall, subsequent encounter: Secondary | ICD-10-CM | POA: Diagnosis not present

## 2023-01-12 DIAGNOSIS — J449 Chronic obstructive pulmonary disease, unspecified: Secondary | ICD-10-CM | POA: Diagnosis not present

## 2023-01-12 LAB — GLUCOSE, CAPILLARY
Glucose-Capillary: 159 mg/dL — ABNORMAL HIGH (ref 70–99)
Glucose-Capillary: 110 mg/dL — ABNORMAL HIGH (ref 70–99)
Glucose-Capillary: 193 mg/dL — ABNORMAL HIGH (ref 70–99)
Glucose-Capillary: 193 mg/dL — ABNORMAL HIGH (ref 70–99)

## 2023-01-12 MED ORDER — MORPHINE SULFATE (PF) 2 MG/ML IV SOLN
2.0000 mg | Freq: Once | INTRAVENOUS | Status: AC
  Administered 2023-01-12: 2 mg via INTRAVENOUS
  Filled 2023-01-12 (×2): qty 1

## 2023-01-12 MED ORDER — GUAIFENESIN ER 600 MG PO TB12
1200.0000 mg | ORAL_TABLET | Freq: Two times a day (BID) | ORAL | Status: DC
  Administered 2023-01-12 (×2): 1200 mg via ORAL
  Filled 2023-01-12 (×2): qty 2

## 2023-01-12 MED ORDER — GUAIFENESIN 200 MG PO TABS
600.0000 mg | ORAL_TABLET | Freq: Four times a day (QID) | ORAL | Status: DC
  Filled 2023-01-12 (×2): qty 3

## 2023-01-12 NOTE — Plan of Care (Signed)
  Problem: Nutritional: Goal: Maintenance of adequate nutrition will improve Outcome: Progressing   Problem: Activity: Goal: Ability to return to baseline activity level will improve Outcome: Progressing   Problem: Pain Management: Goal: General experience of comfort will improve Outcome: Progressing

## 2023-01-12 NOTE — Progress Notes (Signed)
PROGRESS NOTE    Yvette Riley  ZOX:[id number] DOB: [birth year] DOA: 01/07/2023 PCP: Sherol Dade, DO    Brief Narrative:   67 y.o. female with medical history significant for frequent falls,Lipitor CVA with residual right-sided weakness on aspirin/Plavix, CAD, HLD, DM2, HTN, GERD, small cell lung cancer in remission s /p radiation/chemotherapy, asthma/COPD, anxiety/depression, chronic pain on long term opiates,  coming to [place] for fall.  Chart review shows patient had an upper endoscopy on December 25, 2022 showing:- Food in the mid esophagus.  Chart review shows patient had an upper endoscopy in November 2024 showing- Normal stomach. - Normal examined duodenum. - Normal gastroesophageal junction and esophagus.   12/15: Vomited once, KUB pending.   Assessment & Plan:   Principal Problem:   Fall Active Problems:   NSTEMI (non-ST elevated myocardial infarction) (HCC)   COPD (chronic obstructive pulmonary disease) (HCC)   Type 2 diabetes mellitus (HCC)   Anemia   Glaucoma   Small cell lung cancer, right (HCC)  Fall Patient reports multiple falls within the last several weeks.  Etiology unclear CT angio negative for PE Unable to rule out syncopal events Plan: Fall precautions.  Therapy evaluations.  Current recommendation for skilled nursing facility.  She is a [facility] at [facility] but will need new authorization for STR    NSTEMI (non-ST elevated myocardial infarction) [facility]) Slowly uptrending troponins Cardiology consulted Plan: Diagnostic catheterization 12/11 with angioplasty Uninterrupted DAPT x 12 months Possible repeat PCI in 2 to 3 weeks as outpatient Cleared for discharge from cardiology standpoint  Small cell lung cancer, right [facility]) Outpatient follow-up   Glaucoma Continue home ophthalmic gtt.   Anemia Continue p.o. B12 supplement   Type 2 diabetes mellitus (HCC) Last A1c was a year ago at 6.9 and current regimen includes  metformin and NovoLog. We will cover patient with glycemic protocol currently.   COPD (chronic obstructive pulmonary disease) (HCC) Clinically stable no wheezing noted on exam no respiratory distress. Continue with patient's Trelegy Ellipta, Singulair, as needed albuterol.  Obesity BMI 37.6.  Complicating factor in overall care and prognosis.   Vomiting As needed Zofran.  Check KUB.  She did have bowel movement on 12/14   DVT prophylaxis: SQ Lovenox Code Status: DNR Family Communication: None today Disposition Plan: Status is: Inpatient Remains inpatient appropriate because: Unsafe discharge plan.  Patient will need skilled nursing facility.  Insurance authorization initiated.  Medically appropriate for discharge to SNF   Level of care: Progressive  Consultants:  Cardiology  Procedures:  Left heart catheterization  Antimicrobials: None   Subjective:  Vomited once this morning, feeling tired  Objective: Vitals:   01/11/23 2341 01/12/23 0312 01/12/23 0807 01/12/23 0819  BP: 124/79 119/66 (!) 131/106 131/80  Pulse: 99 97 (!) 112 (!) 101  Resp: 18 18    Temp: 98.8 F (37.1 C) 98.8 F (37.1 C) 98.1 F (36.7 C)   TempSrc:      SpO2: 96% 94% 94%   Weight:      Height:        Intake/Output Summary (Last 24 hours) at 01/12/2023 1029 Last data filed at 01/12/2023 0606 Gross per 24 hour  Intake 300 ml  Output --  Net 300 ml   Filed Weights   01/07/23 1354 01/08/23 1326  Weight: 81.6 kg 81.6 kg    Examination:  General exam: No apparent distress Respiratory system: Lungs clear.  Normal work of breathing.  Room air Cardiovascular system: S1-2, RRR, no murmurs,  no pedal edema Gastrointestinal system: Obese, soft, NT/ND, normal bowel sounds Central nervous system: Alert and oriented. No focal neurological deficits. Extremities: Symmetric 5 x 5 power. Skin: No rashes, lesions or ulcers Psychiatry: Judgement and insight appear normal. Mood & affect  appropriate.     Data Reviewed: I have personally reviewed following labs and imaging studies  CBC: Recent Labs  Lab 01/07/23 1427 01/08/23 0328 01/09/23 0535 01/11/23 0838  WBC 7.7 6.3 7.4 7.4  NEUTROABS  --   --   --  5.3  HGB 11.1* 10.6* 10.8* 10.7*  HCT 34.8* 33.2* 33.5* 34.0*  MCV 85.3 83.8 82.7 85.2  PLT 227 199 207 226   Basic Metabolic Panel: Recent Labs  Lab 01/07/23 1427 01/08/23 0328 01/09/23 0535 01/11/23 0838  NA 134* 134* 135 136  K 4.0 4.1 3.8 4.0  CL 100 101 101 99  CO2 21* 24 24 26   GLUCOSE 137* 147* 126* 142*  BUN 14 16 9 15   CREATININE 0.92 0.89 0.65 0.75  CALCIUM 8.6* 8.5* 8.4* 9.1   GFR: Estimated Creatinine Clearance: 62.5 mL/min (by C-G formula based on SCr of 0.75 mg/dL). Liver Function Tests: Recent Labs  Lab 01/07/23 1832  AST 29  ALT 16  ALKPHOS 80  BILITOT 0.6  PROT 7.3  ALBUMIN 3.9   No results for input(s): "LIPASE", "AMYLASE" in the last 168 hours.  No results for input(s): "AMMONIA" in the last 168 hours. Coagulation Profile: Recent Labs  Lab 01/08/23 0134  INR 1.0   Cardiac Enzymes: Recent Labs  Lab 01/07/23 1832  CKTOTAL 163   BNP (last 3 results) No results for input(s): "PROBNP" in the last 8760 hours. HbA1C: No results for input(s): "HGBA1C" in the last 72 hours.  CBG: Recent Labs  Lab 01/11/23 0758 01/11/23 1216 01/11/23 1700 01/11/23 2055 01/12/23 0809  GLUCAP 126* 185* 178* 124* 159*   Lipid Profile: No results for input(s): "CHOL", "HDL", "LDLCALC", "TRIG", "CHOLHDL", "LDLDIRECT" in the last 72 hours.  Thyroid Function Tests: No results for input(s): "TSH", "T4TOTAL", "FREET4", "T3FREE", "THYROIDAB" in the last 72 hours.  Anemia Panel: No results for input(s): "VITAMINB12", "FOLATE", "FERRITIN", "TIBC", "IRON", "RETICCTPCT" in the last 72 hours. Sepsis Labs: Recent Labs  Lab 01/08/23 0134  LATICACIDVEN 1.7    Recent Results (from the past 240 hours)  SARS Coronavirus 2 by RT PCR  (hospital order, performed in [facility] hospital lab) *cepheid single result test* Anterior Nasal Swab     Status: None   Collection Time: 01/04/23  8:06 PM   Specimen: Anterior Nasal Swab  Result Value Ref Range Status   SARS Coronavirus 2 by RT PCR NEGATIVE NEGATIVE Final    Comment: (NOTE) SARS-CoV-2 target nucleic acids are NOT DETECTED.  The SARS-CoV-2 RNA is generally detectable in upper and lower respiratory specimens during the acute phase of infection. The lowest concentration of SARS-CoV-2 viral copies this assay can detect is 250 copies / mL. A negative result does not preclude SARS-CoV-2 infection and should not be used as the sole basis for treatment or other patient management decisions.  A negative result may occur with improper specimen collection / handling, submission of specimen other than nasopharyngeal swab, presence of viral mutation(s) within the areas targeted by this assay, and inadequate number of viral copies (<250 copies / mL). A negative result must be combined with clinical observations, patient history, and epidemiological information.  Fact Sheet for Patients:   RoadLapTop.co.za  Fact Sheet for Healthcare Providers: http://kim-miller.com/  This  test is not yet approved or  cleared by the [place] and has been authorized for detection and/or diagnosis of SARS-CoV-2 by FDA under an Emergency Use Authorization (EUA).  This EUA will remain in effect (meaning this test can be used) for the duration of the COVID-19 declaration under Section 564(b)(1) of the Act, 21 U.S.C. section 360bbb-3(b)(1), unless the authorization is terminated or revoked sooner.  Performed at [facility], [address] [place], [place] [address]          Radiology Studies: No results found.       Scheduled Meds:  aspirin EC  81 mg Oral Daily   busPIRone  5 mg Oral TID   clopidogrel  75 mg Oral Q  breakfast   diclofenac Sodium  4 g Topical QID   enoxaparin (LOVENOX) injection  40 mg Subcutaneous Q24H   fluticasone furoate-vilanterol  1 puff Inhalation Daily   And   umeclidinium bromide  1 puff Inhalation Daily   guaiFENesin  1,200 mg Oral BID   insulin aspart  0-15 Units Subcutaneous TID WC   insulin aspart  0-5 Units Subcutaneous QHS   levothyroxine  50 mcg Oral Q0600   metoprolol succinate  50 mg Oral Daily   sertraline  50 mg Oral Daily   sodium chloride flush  3 mL Intravenous Q12H   sodium chloride flush  3 mL Intravenous Q12H   Continuous Infusions:     LOS: 5 days   Delfino Lovett, MD [facility] Time spent 35 minutes  If 7PM-7AM, please contact night-coverage  01/12/2023, 10:29 AM

## 2023-01-12 NOTE — [place] FL2 (Signed)
[place] MEDICAID FL2 LEVEL OF CARE FORM     IDENTIFICATION  Patient Name: Yvette Riley Birthdate: 1955/05/05 Sex: female Admission Date (Current Location): 01/07/2023  [place] and [place] Number:  [address] [id number] [address] Facility and Address:  [facility], [address] [place], [place] [address]      Provider Number: [id number]  Attending Physician Name and Address:  Delfino Lovett, MD  Relative Name and Phone Number:  Abigail Butts (Sister)  [phone] [facility])    Current Level of Care: Hospital Recommended Level of Care: Skilled Nursing Facility Prior Approval Number: [id number]  Date Approved/Denied: 06/18/18 PASRR Number: [id number]  Discharge Plan: SNF    Current Diagnoses: Patient Active Problem List   Diagnosis Date Noted   Fall 01/07/2023   NSTEMI (non-ST elevated myocardial infarction) (HCC) 01/07/2023   Esophageal foreign body, initial encounter 12/25/2022   Food bolus obstruction of intestine (HCC) 05/17/2022   Dysphagia 02/07/2022   Vertebral artery stenosis, right 02/07/2022   SIRS (systemic inflammatory response syndrome) (HCC) 02/07/2022   Nausea & vomiting 02/07/2022   Abnormal red cell volume 05/16/2021   B12 deficiency 01/26/2021   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) 05/18/2019   History of COPD 05/18/2019   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   Chronic, continuous use of opioids 08/05/2018   History of cerebellar CVA with residual deficit 06/17/2018   Chronic cough 06/02/2018   Anxiety 07/19/2016   Small cell lung cancer, right (HCC) 05/13/2016   Bilateral presbyopia 05/06/2016   Combined forms of age-related cataract of both eyes 05/06/2016   Dry eye syndrome of both lacrimal glands 05/06/2016   Chemoprophylaxis 04/29/2016   Severe obesity (BMI 35.0-39.9) with comorbidity (HCC) 04/16/2016   Weakness generalized 04/06/2016   Mass of upper lobe of right lung 03/01/2016   Coronary  atherosclerosis 02/29/2016   Allergic rhinitis 09/27/2014   Asthma 09/27/2014   Anemia 09/27/2014   Hyperglyceridemia, pure 09/27/2014   Glaucoma 09/27/2014   Hip pain 09/27/2014   Right knee pain 09/27/2014   Displacement of lumbar intervertebral disc without myelopathy 09/27/2014   Thoracic back pain 09/27/2014   Insomnia 08/11/2014   COPD (chronic obstructive pulmonary disease) (HCC) 06/12/2014   Type 2 diabetes mellitus (HCC) 06/12/2014   Back pain 06/12/2014   Smoking greater than 30 pack years 03/28/2014    Orientation RESPIRATION BLADDER Height & Weight     Self, Time, Situation, Place  Normal Continent Weight: 81.6 kg Height:  4\' 10"  (147.3 cm)  BEHAVIORAL SYMPTOMS/MOOD NEUROLOGICAL BOWEL NUTRITION STATUS      Continent Diet  AMBULATORY STATUS COMMUNICATION OF NEEDS Skin   Limited Assist (But htx of frequent t falls and balance issues.) Verbally Bruising (Bil Upper Arms)                       Personal Care Assistance Level of Assistance  Bathing, Dressing Bathing Assistance: Maximum assistance   Dressing Assistance: Maximum assistance     Functional Limitations Info  Sight Sight Info: Impaired        SPECIAL CARE FACTORS FREQUENCY  PT (By licensed PT), OT (By licensed OT)     PT Frequency: 5x/week OT Frequency: 5x/week            Contractures Contractures Info: Not present    Additional Factors Info  Code Status, Allergies Code Status Info: DNR Limited, see chart for details. Allergies Info: Other, Penicillins, Yellow Jacket Venom (Bee Venom), Codeine, Crestor (Rosuvastatin), Gemfibrozil, Trazodone And Nefazodone,  Lipitor (Atorvastatin)           Current Medications (01/12/2023):  This is the current hospital active medication list Current Facility-Administered Medications  Medication Dose Route Frequency Provider Last Rate Last Admin   acetaminophen (TYLENOL) tablet 650 mg  650 mg Oral Q4H PRN Gertha Calkin, MD       albuterol  (PROVENTIL) (2.5 MG/3ML) 0.083% nebulizer solution 2.5 mg  2.5 mg Nebulization Q6H PRN Lolita Patella B, MD       aspirin EC tablet 81 mg  81 mg Oral Daily Gertha Calkin, MD   81 mg at 01/12/23 0827   busPIRone (BUSPAR) tablet 5 mg  5 mg Oral TID Gertha Calkin, MD   5 mg at 01/12/23 0827   clopidogrel (PLAVIX) tablet 75 mg  75 mg Oral Q breakfast Callwood, Dwayne D, MD   75 mg at 01/12/23 1610   diclofenac Sodium (VOLTAREN) 1 % topical gel 4 g  4 g Topical QID Lolita Patella B, MD   4 g at 01/12/23 1013   enoxaparin (LOVENOX) injection 40 mg  40 mg Subcutaneous Q24H Sreenath, Sudheer B, MD   40 mg at 01/11/23 1441   fluticasone furoate-vilanterol (BREO ELLIPTA) 200-25 MCG/ACT 1 puff  1 puff Inhalation Daily Gertha Calkin, MD   1 puff at 01/08/23 0837   And   umeclidinium bromide (INCRUSE ELLIPTA) 62.5 MCG/ACT 1 puff  1 puff Inhalation Daily Gertha Calkin, MD   1 puff at 01/08/23 0837   guaiFENesin (MUCINEX) 12 hr tablet 1,200 mg  1,200 mg Oral BID Manuela Schwartz, NP   1,200 mg at 01/12/23 1013   insulin aspart (novoLOG) injection 0-15 Units  0-15 Units Subcutaneous TID WC Lolita Patella B, MD   3 Units at 01/12/23 9604   insulin aspart (novoLOG) injection 0-5 Units  0-5 Units Subcutaneous QHS Lolita Patella B, MD       levothyroxine (SYNTHROID) tablet 50 mcg  50 mcg Oral Q0600 Gertha Calkin, MD   50 mcg at 01/11/23 0542   metoprolol succinate (TOPROL-XL) 24 hr tablet 50 mg  50 mg Oral Daily Gertha Calkin, MD   50 mg at 01/12/23 0827   nitroGLYCERIN (NITROSTAT) SL tablet 0.4 mg  0.4 mg Sublingual Q5 Min x 3 PRN Gertha Calkin, MD       ondansetron [facility]) injection 4 mg  4 mg Intravenous Q6H PRN Gertha Calkin, MD   4 mg at 01/12/23 0853   oxyCODONE-acetaminophen (PERCOCET/ROXICET) 5-325 MG per tablet 2 tablet  2 tablet Oral Q6H PRN Lolita Patella B, MD   2 tablet at 01/11/23 2042   sertraline (ZOLOFT) tablet 50 mg  50 mg Oral Daily Gertha Calkin, MD   50 mg at 01/12/23 0827   sodium  chloride flush (NS) 0.9 % injection 3 mL  3 mL Intravenous Q12H Irena Cords V, MD   3 mL at 01/12/23 1014   sodium chloride flush (NS) 0.9 % injection 3 mL  3 mL Intravenous PRN Gertha Calkin, MD       sodium chloride flush (NS) 0.9 % injection 3 mL  3 mL Intravenous PRN Adrian Blackwater A, MD       sodium chloride flush (NS) 0.9 % injection 3 mL  3 mL Intravenous Q12H Lolita Patella B, MD   3 mL at 01/12/23 0825     Discharge Medications: Please see discharge summary for a list of discharge medications.  Relevant Imaging Results:  Relevant Lab Results:   Additional Information SS #: [id number]  Bing Quarry, RN

## 2023-01-12 NOTE — TOC Progression Note (Signed)
Transition of Care [facility]) - Progression Note    Patient Details  Name: Yvette Riley MRN: [id number] Date of Birth: 03/04/1955  Transition of Care [facility]) CM/SW Contact  Bing Quarry, RN Phone Number: 01/12/2023, 12:47 PM  Clinical Narrative:  12/15: Insurance authorization started. ID # [id number].  Gabriel Cirri MSN RN CM  Care Management Department.  [place]  [facility] Direct Dial: [phone] Main Office Phone: [phone] Weekends Only           Expected Discharge Plan and Services                                               Social Determinants of Health (SDOH) Interventions SDOH Screenings   Food Insecurity: No Food Insecurity (01/08/2023)  Housing: Low Risk  (01/08/2023)  Transportation Needs: No Transportation Needs (01/08/2023)  Utilities: Not At Risk (01/08/2023)  Alcohol Screen: Low Risk  (09/25/2021)  Depression (PHQ2-9): High Risk (09/25/2021)  Financial Resource Strain: Low Risk  (09/05/2021)  Physical Activity: Inactive (09/05/2021)  Social Connections: Unknown (06/01/2021)   Received from [facility], [facility]  Stress: Stress Concern Present (09/05/2021)  Tobacco Use: Medium Risk (01/07/2023)    Readmission Risk Interventions     No data to display

## 2023-01-13 DIAGNOSIS — J449 Chronic obstructive pulmonary disease, unspecified: Secondary | ICD-10-CM | POA: Diagnosis not present

## 2023-01-13 DIAGNOSIS — I214 Non-ST elevation (NSTEMI) myocardial infarction: Secondary | ICD-10-CM | POA: Diagnosis not present

## 2023-01-13 DIAGNOSIS — C3491 Malignant neoplasm of unspecified part of right bronchus or lung: Secondary | ICD-10-CM | POA: Diagnosis not present

## 2023-01-13 DIAGNOSIS — W19XXXD Unspecified fall, subsequent encounter: Secondary | ICD-10-CM | POA: Diagnosis not present

## 2023-01-13 LAB — CBC
HCT: 33.2 % — ABNORMAL LOW (ref 36.0–46.0)
Hemoglobin: 10.4 g/dL — ABNORMAL LOW (ref 12.0–15.0)
MCH: 26.3 pg (ref 26.0–34.0)
MCHC: 31.3 g/dL (ref 30.0–36.0)
MCV: 84.1 fL (ref 80.0–100.0)
Platelets: 232 10*3/uL (ref 150–400)
RBC: 3.95 MIL/uL (ref 3.87–5.11)
RDW: 14.3 % (ref 11.5–15.5)
WBC: 6.7 10*3/uL (ref 4.0–10.5)
nRBC: 0 % (ref 0.0–0.2)

## 2023-01-13 LAB — GLUCOSE, CAPILLARY
Glucose-Capillary: 136 mg/dL — ABNORMAL HIGH (ref 70–99)
Glucose-Capillary: 161 mg/dL — ABNORMAL HIGH (ref 70–99)
Glucose-Capillary: 170 mg/dL — ABNORMAL HIGH (ref 70–99)
Glucose-Capillary: 172 mg/dL — ABNORMAL HIGH (ref 70–99)

## 2023-01-13 LAB — COMPREHENSIVE METABOLIC PANEL
ALT: 15 U/L (ref 0–44)
AST: 28 U/L (ref 15–41)
Albumin: 3.8 g/dL (ref 3.5–5.0)
Alkaline Phosphatase: 74 U/L (ref 38–126)
Anion gap: 12 (ref 5–15)
BUN: 21 mg/dL (ref 8–23)
CO2: 25 mmol/L (ref 22–32)
Calcium: 8.7 mg/dL — ABNORMAL LOW (ref 8.9–10.3)
Chloride: 99 mmol/L (ref 98–111)
Creatinine, Ser: 0.92 mg/dL (ref 0.44–1.00)
GFR, Estimated: >60 mL/min (ref >60)
Glucose, Bld: 148 mg/dL — ABNORMAL HIGH (ref 70–99)
Potassium: 3.8 mmol/L (ref 3.5–5.1)
Sodium: 136 mmol/L (ref 135–145)
Total Bilirubin: 0.5 mg/dL (ref <1.2)
Total Protein: 6.8 g/dL (ref 6.5–8.1)

## 2023-01-13 MED ORDER — OXYCODONE-ACETAMINOPHEN 5-325 MG PO TABS
2.0000 | ORAL_TABLET | Freq: Four times a day (QID) | ORAL | Status: DC | PRN
  Administered 2023-01-13 – 2023-01-14 (×3): 2 via ORAL
  Filled 2023-01-13 (×3): qty 2

## 2023-01-13 MED ORDER — GUAIFENESIN 200 MG PO TABS
600.0000 mg | ORAL_TABLET | Freq: Four times a day (QID) | ORAL | Status: DC
  Administered 2023-01-13 – 2023-01-14 (×4): 600 mg via ORAL
  Filled 2023-01-13 (×3): qty 3

## 2023-01-13 MED ORDER — OXYCODONE-ACETAMINOPHEN 5-325 MG PO TABS: 1.0000 | ORAL_TABLET | Freq: Four times a day (QID) | ORAL | Status: DC | PRN

## 2023-01-13 NOTE — Plan of Care (Signed)

## 2023-01-13 NOTE — Progress Notes (Signed)
PROGRESS NOTE    Yvette Riley  ZOX:[id number] DOB: 08-Aug-1955 DOA: 01/07/2023 PCP: Sherol Dade, DO    Brief Narrative:   67 y.o. female with medical history significant for frequent falls,Lipitor CVA with residual right-sided weakness on aspirin/Plavix, CAD, HLD, DM2, HTN, GERD, small cell lung cancer in remission s /p radiation/chemotherapy, asthma/COPD, anxiety/depression, chronic pain on long term opiates,  coming to [place] for fall.  Chart review shows patient had an upper endoscopy on December 25, 2022 showing:- Food in the mid esophagus.  Chart review shows patient had an upper endoscopy in November 2024 showing- Normal stomach. - Normal examined duodenum. - Normal gastroesophageal junction and esophagus.   12/15: Vomited once, KUB neg 12/16: waiting for SNF, tapering narcotics   Assessment & Plan:   Principal Problem:   Fall Active Problems:   NSTEMI (non-ST elevated myocardial infarction) (HCC)   COPD (chronic obstructive pulmonary disease) (HCC)   Type 2 diabetes mellitus (HCC)   Anemia   Glaucoma   Small cell lung cancer, right (HCC)  Fall Patient reports multiple falls within the last several weeks.  Etiology unclear CT angio negative for PE Unable to rule out syncopal events Plan: Fall precautions.  Therapy evaluations.  Current recommendation for skilled nursing facility.  She is a long-term care resident at [facility] but will need new authorization for STR  NSTEMI (non-ST elevated myocardial infarction) [facility]) Slowly uptrending troponins Cardiology consulted Plan: Diagnostic catheterization 12/11 with angioplasty Uninterrupted DAPT x 12 months Possible repeat PCI in 2 to 3 weeks as outpatient Cleared for discharge from cardiology standpoint  Small cell lung cancer, right [facility]) Outpatient follow-up   Glaucoma Continue home ophthalmic gtt.   Anemia Continue p.o. B12 supplement   Type 2 diabetes mellitus (HCC) Last A1c was a year ago at  6.9 and current regimen includes metformin and NovoLog. We will cover patient with glycemic protocol currently.   COPD (chronic obstructive pulmonary disease) (HCC) Clinically stable no wheezing noted on exam no respiratory distress. Continue with patient's Trelegy Ellipta, Singulair, as needed albuterol.  Obesity BMI 37.6.  Complicating factor in overall care and prognosis.   Vomiting As needed Zofran. normal KUB.  She did have bowel movement on 12/14   DVT prophylaxis: SQ Lovenox Code Status: DNR Family Communication: None today Disposition Plan: Status is: Inpatient Remains inpatient appropriate because: Unsafe discharge plan.  Patient will need skilled nursing facility.  Insurance authorization initiated.  Medically appropriate for discharge to SNF   Level of care: Progressive  Consultants:  Cardiology  Procedures:  Left heart catheterization  Antimicrobials: None   Subjective:  Feeling ok, no new issues, waiting for placement  Objective: Vitals:   01/13/23 0405 01/13/23 0721 01/13/23 1250 01/13/23 1617  BP: 111/74 102/73 111/76 109/76  Pulse: 97 91 96 (!) 114  Resp:      Temp: 98.4 F (36.9 C) 98.4 F (36.9 C) 97.8 F (36.6 C) 97.8 F (36.6 C)  TempSrc:      SpO2: 93% 96% 93% 94%  Weight:      Height:        Intake/Output Summary (Last 24 hours) at 01/13/2023 1633 Last data filed at 01/13/2023 0400 Gross per 24 hour  Intake 357 ml  Output 100 ml  Net 257 ml   Filed Weights   01/07/23 1354 01/08/23 1326  Weight: 81.6 kg 81.6 kg    Examination:  General exam: No apparent distress Respiratory system: Lungs clear.  Normal work of breathing.  Room  air Cardiovascular system: S1-2, RRR, no murmurs, no pedal edema Gastrointestinal system: Obese, soft, NT/ND, normal bowel sounds Central nervous system: Alert and oriented. No focal neurological deficits. Extremities: Symmetric 5 x 5 power. Skin: No rashes, lesions or ulcers Psychiatry: Judgement  and insight appear normal. Mood & affect appropriate.     Data Reviewed: I have personally reviewed following labs and imaging studies  CBC: Recent Labs  Lab 01/07/23 1427 01/08/23 0328 01/09/23 0535 01/11/23 0838 01/13/23 0636  WBC 7.7 6.3 7.4 7.4 6.7  NEUTROABS  --   --   --  5.3  --   HGB 11.1* 10.6* 10.8* 10.7* 10.4*  HCT 34.8* 33.2* 33.5* 34.0* 33.2*  MCV 85.3 83.8 82.7 85.2 84.1  PLT 227 199 207 226 232   Basic Metabolic Panel: Recent Labs  Lab 01/07/23 1427 01/08/23 0328 01/09/23 0535 01/11/23 0838 01/13/23 0636  NA 134* 134* 135 136 136  K 4.0 4.1 3.8 4.0 3.8  CL 100 101 101 99 99  CO2 21* 24 24 26 25   GLUCOSE 137* 147* 126* 142* 148*  BUN 14 16 9 15 21   CREATININE 0.92 0.89 0.65 0.75 0.92  CALCIUM 8.6* 8.5* 8.4* 9.1 8.7*   GFR: Estimated Creatinine Clearance: 54.3 mL/min (by C-G formula based on SCr of 0.92 mg/dL). Liver Function Tests: Recent Labs  Lab 01/07/23 1832 01/13/23 0636  AST 29 28  ALT 16 15  ALKPHOS 80 74  BILITOT 0.6 0.5  PROT 7.3 6.8  ALBUMIN 3.9 3.8   No results for input(s): "LIPASE", "AMYLASE" in the last 168 hours.  No results for input(s): "AMMONIA" in the last 168 hours. Coagulation Profile: Recent Labs  Lab 01/08/23 0134  INR 1.0   Cardiac Enzymes: Recent Labs  Lab 01/07/23 1832  CKTOTAL 163   BNP (last 3 results) No results for input(s): "PROBNP" in the last 8760 hours. HbA1C: No results for input(s): "HGBA1C" in the last 72 hours.  CBG: Recent Labs  Lab 01/12/23 1619 01/12/23 1944 01/13/23 0721 01/13/23 1244 01/13/23 1619  GLUCAP 193* 193* 136* 161* 170*   Lipid Profile: No results for input(s): "CHOL", "HDL", "LDLCALC", "TRIG", "CHOLHDL", "LDLDIRECT" in the last 72 hours.  Thyroid Function Tests: No results for input(s): "TSH", "T4TOTAL", "FREET4", "T3FREE", "THYROIDAB" in the last 72 hours.  Anemia Panel: No results for input(s): "VITAMINB12", "FOLATE", "FERRITIN", "TIBC", "IRON", "RETICCTPCT"  in the last 72 hours. Sepsis Labs: Recent Labs  Lab 01/08/23 0134  LATICACIDVEN 1.7    Recent Results (from the past 240 hours)  SARS Coronavirus 2 by RT PCR (hospital order, performed in [facility] hospital lab) *cepheid single result test* Anterior Nasal Swab     Status: None   Collection Time: 01/04/23  8:06 PM   Specimen: Anterior Nasal Swab  Result Value Ref Range Status   SARS Coronavirus 2 by RT PCR NEGATIVE NEGATIVE Final    Comment: (NOTE) SARS-CoV-2 target nucleic acids are NOT DETECTED.  The SARS-CoV-2 RNA is generally detectable in upper and lower respiratory specimens during the acute phase of infection. The lowest concentration of SARS-CoV-2 viral copies this assay can detect is 250 copies / mL. A negative result does not preclude SARS-CoV-2 infection and should not be used as the sole basis for treatment or other patient management decisions.  A negative result may occur with improper specimen collection / handling, submission of specimen other than nasopharyngeal swab, presence of viral mutation(s) within the areas targeted by this assay, and inadequate number of viral copies (<  250 copies / mL). A negative result must be combined with clinical observations, patient history, and epidemiological information.  Fact Sheet for Patients:   RoadLapTop.co.za  Fact Sheet for Healthcare Providers: http://kim-miller.com/  This test is not yet approved or  cleared by the [place] FDA and has been authorized for detection and/or diagnosis of SARS-CoV-2 by FDA under an Emergency Use Authorization (EUA).  This EUA will remain in effect (meaning this test can be used) for the duration of the COVID-19 declaration under Section 564(b)(1) of the Act, 21 U.S.C. section 360bbb-3(b)(1), unless the authorization is terminated or revoked sooner.  Performed at [facility], [address] [place], [place] [address]           Radiology Studies: DG Abd 1 View Result Date: 01/12/2023 CLINICAL DATA:  67 year old female with nausea vomiting. EXAM: ABDOMEN - 1 VIEW COMPARISON:  CT Abdomen and Pelvis 12/09/2022. FINDINGS: Portable AP supine view at 0920 hours. Lung bases appears stable, negative. Non obstructed bowel gas pattern. Decompressed stomach. No evidence of pneumoperitoneum on these supine views. Pelvic phleboliths. No acute osseous abnormality identified. IMPRESSION: Non obstructed bowel gas pattern. Electronically Signed   By: Odessa Fleming M.D.   On: 01/12/2023 12:40         Scheduled Meds:  aspirin EC  81 mg Oral Daily   busPIRone  5 mg Oral TID   clopidogrel  75 mg Oral Q breakfast   diclofenac Sodium  4 g Topical QID   enoxaparin (LOVENOX) injection  40 mg Subcutaneous Q24H   fluticasone furoate-vilanterol  1 puff Inhalation Daily   And   umeclidinium bromide  1 puff Inhalation Daily   guaiFENesin  600 mg Oral Q6H   insulin aspart  0-15 Units Subcutaneous TID WC   insulin aspart  0-5 Units Subcutaneous QHS   levothyroxine  50 mcg Oral Q0600   metoprolol succinate  50 mg Oral Daily   sertraline  50 mg Oral Daily   sodium chloride flush  3 mL Intravenous Q12H   sodium chloride flush  3 mL Intravenous Q12H   Continuous Infusions:     LOS: 6 days   Delfino Lovett, MD [facility] Time spent 35 minutes  If 7PM-7AM, please contact night-coverage  01/13/2023, 4:33 PM

## 2023-01-13 NOTE — TOC Progression Note (Signed)
Transition of Care [facility]) - Progression Note    Patient Details  Name: Yvette Riley MRN: [id number] Date of Birth: Sep 25, 1955  [facility] of [facility]) CM/SW Contact  Truddie Hidden, RN Phone Number: 01/13/2023, 9:14 AM  Clinical Narrative:    [facility] pending per Navi portal.        Expected Discharge Plan and Services                                               Social Determinants of Health (SDOH) Interventions SDOH Screenings   Food Insecurity: No Food Insecurity (01/08/2023)  Housing: Low Risk  (01/08/2023)  Transportation Needs: No Transportation Needs (01/08/2023)  Utilities: Not At Risk (01/08/2023)  Alcohol Screen: Low Risk  (09/25/2021)  Depression (PHQ2-9): High Risk (09/25/2021)  Financial Resource Strain: Low Risk  (09/05/2021)  Physical Activity: Inactive (09/05/2021)  Social Connections: Unknown (06/01/2021)   Received from [facility]  Stress: Stress Concern Present (09/05/2021)  Tobacco Use: Medium Risk (01/07/2023)    Readmission Risk Interventions     No data to display

## 2023-01-14 DIAGNOSIS — J449 Chronic obstructive pulmonary disease, unspecified: Secondary | ICD-10-CM | POA: Diagnosis not present

## 2023-01-14 DIAGNOSIS — Z7401 Bed confinement status: Secondary | ICD-10-CM | POA: Diagnosis not present

## 2023-01-14 DIAGNOSIS — I214 Non-ST elevation (NSTEMI) myocardial infarction: Secondary | ICD-10-CM | POA: Diagnosis not present

## 2023-01-14 DIAGNOSIS — M6281 Muscle weakness (generalized): Secondary | ICD-10-CM | POA: Diagnosis not present

## 2023-01-14 DIAGNOSIS — E039 Hypothyroidism, unspecified: Secondary | ICD-10-CM | POA: Diagnosis not present

## 2023-01-14 DIAGNOSIS — R0602 Shortness of breath: Secondary | ICD-10-CM | POA: Diagnosis not present

## 2023-01-14 DIAGNOSIS — C3491 Malignant neoplasm of unspecified part of right bronchus or lung: Secondary | ICD-10-CM | POA: Diagnosis not present

## 2023-01-14 DIAGNOSIS — I2510 Atherosclerotic heart disease of native coronary artery without angina pectoris: Secondary | ICD-10-CM | POA: Diagnosis not present

## 2023-01-14 DIAGNOSIS — K219 Gastro-esophageal reflux disease without esophagitis: Secondary | ICD-10-CM | POA: Diagnosis not present

## 2023-01-14 DIAGNOSIS — G4733 Obstructive sleep apnea (adult) (pediatric): Secondary | ICD-10-CM | POA: Diagnosis not present

## 2023-01-14 DIAGNOSIS — I4711 Inappropriate sinus tachycardia, so stated: Secondary | ICD-10-CM | POA: Diagnosis not present

## 2023-01-14 DIAGNOSIS — R6889 Other general symptoms and signs: Secondary | ICD-10-CM | POA: Diagnosis not present

## 2023-01-14 DIAGNOSIS — I10 Essential (primary) hypertension: Secondary | ICD-10-CM | POA: Diagnosis not present

## 2023-01-14 DIAGNOSIS — W19XXXD Unspecified fall, subsequent encounter: Secondary | ICD-10-CM | POA: Diagnosis not present

## 2023-01-14 DIAGNOSIS — G8929 Other chronic pain: Secondary | ICD-10-CM | POA: Diagnosis not present

## 2023-01-14 DIAGNOSIS — G629 Polyneuropathy, unspecified: Secondary | ICD-10-CM | POA: Diagnosis not present

## 2023-01-14 DIAGNOSIS — E11610 Type 2 diabetes mellitus with diabetic neuropathic arthropathy: Secondary | ICD-10-CM | POA: Diagnosis not present

## 2023-01-14 DIAGNOSIS — E119 Type 2 diabetes mellitus without complications: Secondary | ICD-10-CM | POA: Diagnosis not present

## 2023-01-14 DIAGNOSIS — R0789 Other chest pain: Secondary | ICD-10-CM | POA: Diagnosis not present

## 2023-01-14 DIAGNOSIS — I5033 Acute on chronic diastolic (congestive) heart failure: Secondary | ICD-10-CM | POA: Diagnosis not present

## 2023-01-14 DIAGNOSIS — E538 Deficiency of other specified B group vitamins: Secondary | ICD-10-CM | POA: Diagnosis not present

## 2023-01-14 DIAGNOSIS — E785 Hyperlipidemia, unspecified: Secondary | ICD-10-CM | POA: Diagnosis not present

## 2023-01-14 DIAGNOSIS — G8921 Chronic pain due to trauma: Secondary | ICD-10-CM | POA: Diagnosis not present

## 2023-01-14 LAB — GLUCOSE, CAPILLARY: Glucose-Capillary: 122 mg/dL — ABNORMAL HIGH (ref 70–99)

## 2023-01-14 MED ORDER — ASPIRIN 81 MG PO TBEC: 81.0000 mg | DELAYED_RELEASE_TABLET | Freq: Every day | ORAL | 12 refills | Status: AC

## 2023-01-14 MED ORDER — OXYCODONE-ACETAMINOPHEN 5-325 MG PO TABS: 2.0000 | ORAL_TABLET | Freq: Four times a day (QID) | ORAL | 0 refills | Status: AC | PRN

## 2023-01-14 NOTE — Plan of Care (Signed)
  Problem: Pain Management: Goal: General experience of comfort will improve Outcome: Progressing   Problem: Safety: Goal: Ability to remain free from injury will improve Outcome: Progressing   Problem: Elimination: Goal: Will not experience complications related to urinary retention Outcome: Progressing   Problem: Coping: Goal: Level of anxiety will decrease Outcome: Progressing   Problem: Clinical Measurements: Goal: Respiratory complications will improve Outcome: Progressing   Problem: Clinical Measurements: Goal: Cardiovascular complication will be avoided Outcome: Progressing

## 2023-01-14 NOTE — Care Management Important Message (Signed)
Important Message  Patient Details  Name: Yvette Riley MRN: [id number] Date of Birth: Jun 18, 1955   Important Message Given:  Yes - Medicare IM     Yvette Riley 01/14/2023, 10:09 AM

## 2023-01-14 NOTE — TOC Transition Note (Signed)
Transition of Care [facility]) - Progression Note    Patient Details  Name: Yvette Riley MRN: [id number] Date of Birth: 03-21-1955  Transition of Care [facility]) CM/SW Contact  Truddie Hidden, RN Phone Number: 01/14/2023, 9:38 AM  Clinical Narrative:    Per Navi portal patient is approved for SNF Spoke with Gavin Pound in admissions at [facility] facility patient admission confirmed for today. Patient assigned room # 205 B Nurse will call report to [phone] Face sheet and medical necessity forms printed to the floor to be added to the EMS pack EMS arranged "She's the first available."  Discharge summary and SNF transfer report sent in HUB.  Nurse, and family notified spoke with TOC signing off.         Expected Discharge Plan and Services                                               Social Determinants of Health (SDOH) Interventions SDOH Screenings   Food Insecurity: No Food Insecurity (01/08/2023)  Housing: Low Risk  (01/08/2023)  Transportation Needs: No Transportation Needs (01/08/2023)  Utilities: Not At Risk (01/08/2023)  Alcohol Screen: Low Risk  (09/25/2021)  Depression (PHQ2-9): High Risk (09/25/2021)  Financial Resource Strain: Low Risk  (09/05/2021)  Physical Activity: Inactive (09/05/2021)  Social Connections: Unknown (06/01/2021)   Received from [facility]  Stress: Stress Concern Present (09/05/2021)  Tobacco Use: Medium Risk (01/07/2023)    Readmission Risk Interventions     No data to display

## 2023-01-14 NOTE — Discharge Summary (Signed)
Physician Discharge Summary   Patient: Yvette Riley MRN: [id number] DOB: 1955-03-26  Admit date:     01/07/2023  Discharge date: 01/14/23  Discharge Physician: Delfino Lovett   PCP: Sherol Dade, DO   Recommendations at discharge:   Follow-up with outpatient providers as requested  Discharge Diagnoses: Principal Problem:   Fall Active Problems:   NSTEMI (non-ST elevated myocardial infarction) (HCC)   COPD (chronic obstructive pulmonary disease) (HCC)   Type 2 diabetes mellitus (HCC)   Anemia   Glaucoma   Small cell lung cancer, right [facility])  Hospital Course: Assessment and Plan:  67 y.o. female with medical history significant for frequent falls,Lipitor CVA with residual right-sided weakness on aspirin/Plavix, CAD, HLD, DM2, HTN, GERD, small cell lung cancer in remission s /p radiation/chemotherapy, asthma/COPD, anxiety/depression, chronic pain on long term opiates,  coming to [place] for fall.  Chart review shows patient had an upper endoscopy on December 25, 2022 showing:- Food in the mid esophagus.  Chart review shows patient had an upper endoscopy in November 2024 showing- Normal stomach. - Normal examined duodenum. - Normal gastroesophageal junction and esophagus.    12/15: Vomited once, KUB neg 12/16: waiting for SNF, tapering narcotics   Fall Patient reports multiple falls within the last several weeks.  Etiology unclear CT angio negative for PE Unable to rule out syncopal events Fall precautions.  Therapy evaluations.  Current recommendation for skilled nursing facility.  She is a long-term care resident at [facility] but needing for STR for now   NSTEMI (non-ST elevated myocardial infarction) [facility]) Cardiology seen Diagnostic catheterization 12/11 with angioplasty Uninterrupted DAPT x 12 months Possible repeat PCI in 2 to 3 weeks as outpatient Cleared for discharge from cardiology standpoint   Small cell lung cancer, right [facility]) Outpatient follow-up    Glaucoma Continue home ophthalmic gtt.   Anemia Continue p.o. B12 supplement   Type 2 diabetes mellitus (HCC) Last A1c was a year ago at 6.9   COPD (chronic obstructive pulmonary disease) (HCC) Clinically stable no wheezing noted on exam no respiratory distress. Continue home regimen   Obesity BMI 37.6.  Complicating factor in overall care and prognosis.   Vomiting As needed Zofran. normal KUB.  She did have bowel movement on 12/14 Now resolved.  She is tolerating diet       Consultants: Cardiology Procedures performed: Cardiac cath on 12/11. Successful PCI and stent of mid LAD  Disposition: Skilled nursing facility Diet recommendation:  Discharge Diet Orders (From admission, onward)     Start     Ordered   01/14/23 0000  Diet - low sodium heart healthy        01/14/23 0952           Carb modified diet DISCHARGE MEDICATION: Allergies as of 01/14/2023       Reactions   Other Anaphylaxis   Penicillins Anaphylaxis, Rash, Other (See Comments)   Reaction:  Tongue swelling  Has patient had a PCN reaction causing immediate rash, facial/tongue/throat swelling, SOB or lightheadedness with hypotension:  Yes   Has patient had a PCN reaction causing severe rash involving mucus membranes or skin necrosis: No Has patient had a PCN reaction that required hospitalization No Has patient had a PCN reaction occurring within the last 10 years: No If all of the above answers are "NO", then may proceed with Cephalosporin use.   Yellow Jacket Venom [bee Venom] Anaphylaxis   Codeine Hives   Crestor [rosuvastatin] Nausea And Vomiting   Corrected prior  adverse reaction per [facility] [employer]. On 12/02/3011  patient reported N & V when she takes Crestor   Gemfibrozil Rash, Nausea And Vomiting, Swelling   Trazodone And Nefazodone Nausea And Vomiting   Lipitor [atorvastatin] Nausea Only   By patient report 12/02/2011. Had also been prescribed pravastatin and lovastatin by previous MD.  Unclear if those caused same side effects.         Medication List     STOP taking these medications    oxyCODONE-acetaminophen 10-325 MG tablet Commonly known as: PERCOCET Replaced by: oxyCODONE-acetaminophen 5-325 MG tablet   spironolactone 25 MG tablet Commonly known as: Aldactone   sulfamethoxazole-trimethoprim 800-160 MG tablet Commonly known as: Bactrim DS       TAKE these medications    albuterol (2.5 MG/3ML) 0.083% nebulizer solution Commonly known as: PROVENTIL Take 2.5 mg by nebulization every 6 (six) hours.   aspirin EC 81 MG tablet Take 1 tablet (81 mg total) by mouth daily. Swallow whole. Start taking on: January 15, 2023   busPIRone 5 MG tablet Commonly known as: BUSPAR Take 5 mg by mouth 3 (three) times daily.   clopidogrel 75 MG tablet Commonly known as: PLAVIX Take 1 tablet (75 mg total) by mouth daily.   cyanocobalamin 1000 MCG tablet Commonly known as: VITAMIN B12 Take 1 tablet (1,000 mcg total) by mouth daily.   diclofenac Sodium 1 % Gel Commonly known as: VOLTAREN Apply 4 g topically 4 (four) times daily. What changed:  how much to take when to take this reasons to take this additional instructions   econazole nitrate 1 % cream Apply 1 Application topically 2 (two) times daily as needed.   ezetimibe 10 MG tablet Commonly known as: ZETIA Take 10 mg by mouth daily.   gabapentin 300 MG capsule Commonly known as: NEURONTIN Take 300 mg by mouth 2 (two) times daily.   hydrocortisone cream 1 % Apply 1 Application topically every 6 (six) hours as needed for itching.   ketoconazole 2 % shampoo Commonly known as: NIZORAL Apply 1 Application topically 2 (two) times a week. Apply to scalp twice a week during shower on Wednesday and Saturday. Lather and allow to sit for 5 minutes before rinsing.   levothyroxine 50 MCG tablet Commonly known as: SYNTHROID Take 50 mcg by mouth daily before breakfast.   loperamide 2 MG capsule Commonly  known as: IMODIUM Take by mouth as needed for diarrhea or loose stools.   Magnesium 400 MG Tabs Take 1 tablet by mouth 2 (two) times daily.   metFORMIN 500 MG tablet Commonly known as: GLUCOPHAGE Take 500 mg by mouth 2 (two) times daily with a meal.   metoprolol succinate 50 MG 24 hr tablet Commonly known as: TOPROL-XL Take 50 mg by mouth daily. Take with or immediately following a meal. What changed: Another medication with the same name was removed. Continue taking this medication, and follow the directions you see here.   miconazole 2 % powder Commonly known as: MICOTIN Apply 1 Application topically daily. Apply to affected areas on abdomen daily.   omeprazole 40 MG capsule Commonly known as: PRILOSEC Take 1 capsule (40 mg total) by mouth 2 (two) times daily. Take in the morning and at dinnertime.   ondansetron 4 MG tablet Commonly known as: ZOFRAN Take 4 mg by mouth every 6 (six) hours as needed for nausea.   oxyCODONE-acetaminophen 5-325 MG tablet Commonly known as: PERCOCET/ROXICET Take 2 tablets by mouth every 6 (six) hours as needed for up  to 3 days for moderate pain (pain score 4-6) or severe pain (pain score 7-10). Replaces: oxyCODONE-acetaminophen 10-325 MG tablet   senna 8.6 MG Tabs tablet Commonly known as: SENOKOT Take 2 tablets by mouth daily.   sertraline 50 MG tablet Commonly known as: ZOLOFT Take 1 tablet (50 mg total) by mouth daily.   Trelegy Ellipta 200-62.5-25 MCG/ACT Aepb Generic drug: Fluticasone-Umeclidin-Vilant Inhale 1 puff into the lungs daily.   Vitamin D 50 MCG (2000 UT) Caps Take 1,000 Units by mouth daily.   white petrolatum ointment Apply 1 application  topically daily. After washing with soap and water, apply petrolatum to left lateral back (biopsy site) once a day and cover with Band-Aid.   zaleplon 5 MG capsule Commonly known as: SONATA Take 5 mg by mouth at bedtime.        Discharge Exam: Filed Weights   01/07/23 1354  01/08/23 1326  Weight: 81.6 kg 81.6 kg   General exam: No apparent distress Respiratory system: Lungs clear.  Normal work of breathing.  Room air Cardiovascular system: S1-2, RRR, no murmurs, no pedal edema Gastrointestinal system: Obese, soft, NT/ND, normal bowel sounds Central nervous system: Alert and oriented. No focal neurological deficits. Extremities: Symmetric 5 x 5 power. Skin: No rashes, lesions or ulcers Psychiatry: Judgement and insight appear normal. Mood & affect appropriate.   Condition at discharge: fair  The results of significant diagnostics from this hospitalization (including imaging, microbiology, ancillary and laboratory) are listed below for reference.   Imaging Studies: DG Abd 1 View Result Date: 01/12/2023 CLINICAL DATA:  67 year old female with nausea vomiting. EXAM: ABDOMEN - 1 VIEW COMPARISON:  CT Abdomen and Pelvis 12/09/2022. FINDINGS: Portable AP supine view at 0920 hours. Lung bases appears stable, negative. Non obstructed bowel gas pattern. Decompressed stomach. No evidence of pneumoperitoneum on these supine views. Pelvic phleboliths. No acute osseous abnormality identified. IMPRESSION: Non obstructed bowel gas pattern. Electronically Signed   By: Odessa Fleming M.D.   On: 01/12/2023 12:40   CARDIAC CATHETERIZATION Result Date: 01/09/2023   Mid LAD lesion is 95% stenosed.   Prox RCA lesion is 40% stenosed.   3rd Mrg lesion is 20% stenosed.   1st Mrg-1 lesion is 70% stenosed.   1st Mrg-2 lesion is 70% stenosed.   A drug-eluting stent was successfully placed using a STENT ONYX FRONTIER 2.25X12.   Post intervention, there is a 0% residual stenosis.   Anticipated discharge date to be determined.   Recommend uninterrupted dual antiplatelet therapy with Aspirin 81mg  daily and Clopidogrel 75mg  daily for a minimum of 12 months (ACS-Class I recommendation). Conclusion Inpatient referred for intervention with high-grade lesion in mid LAD 95% Patient has additional disease in  the distal circumflex which will be staged if she does not respond to medical therapy Patient was referred by Dr. Welton Flakes Successful PCI and stent of mid LAD Deployment of a 2.25 x 12 mm frontier Onyx to 12 atm Lesion reduced from 95 down to 0% TIMI-3 flow maintained throughout the case Continue aspirin Plavix Patient tolerated procedure well No complications Consider staged intervention of circumflex Patient to follow-up with primary cardiologist in 1 to 2 weeks   CT HEAD WO CONTRAST ( ) Result Date: 01/08/2023 CLINICAL DATA:  Initial evaluation for acute head trauma. Anticoagulated. EXAM: CT HEAD WITHOUT CONTRAST TECHNIQUE: Contiguous axial images were obtained from the base of the skull through the vertex without intravenous contrast. RADIATION DOSE REDUCTION: This exam was performed according to the departmental dose-optimization program which includes automated exposure  control, adjustment of the mA and/or kV according to patient size and/or use of iterative reconstruction technique. COMPARISON:  Prior study from 01/07/2023 FINDINGS: Brain: Age-related cerebral atrophy with chronic microvascular ischemic disease. Chronic right occipital and right cerebellar infarcts. No acute intracranial hemorrhage. No acute large vessel territory infarct. No mass lesion or midline shift. No hydrocephalus or extra-axial fluid collection. Vascular: No abnormal hyperdense vessel. Scattered vascular calcifications noted within the carotid siphons. Skull: Small soft tissue contusion at the right parietal scalp. Calvarium intact. Sinuses/Orbits: Globes and orbital soft tissues within normal limits. Paranasal sinuses and mastoid air cells are largely clear. Other: None. IMPRESSION: 1. No acute intracranial abnormality. 2. Small soft tissue contusion at the right parietal scalp. No underlying calvarial fracture. 3. Age-related cerebral atrophy with chronic microvascular ischemic disease, with chronic right occipital and right  cerebellar infarcts. Electronically Signed   By: Rise Mu M.D.   On: 01/08/2023 20:37   CARDIAC CATHETERIZATION Result Date: 01/08/2023   Prox RCA lesion is 40% stenosed.   3rd Mrg lesion is 20% stenosed.   1st Mrg-1 lesion is 70% stenosed.   1st Mrg-2 lesion is 70% stenosed.   Mid LAD lesion is 95% stenosed.   LV end diastolic pressure is moderately elevated.   The left ventricular ejection fraction is greater than 65% by visual estimate.   Recommend dual antiplatelet therapy with Aspirin 81mg  daily and Ticagrelor 90mg  twice daily. Patient has two-vessel disease with critical mid LAD and high-grade lesion in OM1 in the proximal and midportion.  RCA is a small vessel and has mild disease in the midportion.  LVEF was normal about 65% but elevated LVEDP of 27.  Normal wall motion.  Patient has a history of presyncope chest pain and elevated troponin 900 range all suggestive of possible culprit for the symptoms being mid LAD.  Will pursue PCI and stenting of the mid LAD and follow-up PCI and stenting in OM as an outpatient in 2 to 3 weeks.  Patient will be kept overnight and will be discharged either tomorrow or day after depending on how she does.   CT Angio Chest Pulmonary Embolism (PE) W or WO Contrast Result Date: 01/07/2023 CLINICAL DATA:  Recent syncopal episode, history of lung carcinoma, initial encounter EXAM: CT ANGIOGRAPHY CHEST WITH CONTRAST TECHNIQUE: Multidetector CT imaging of the chest was performed using the standard protocol during bolus administration of intravenous contrast. Multiplanar CT image reconstructions and MIPs were obtained to evaluate the vascular anatomy. RADIATION DOSE REDUCTION: This exam was performed according to the departmental dose-optimization program which includes automated exposure control, adjustment of the mA and/or kV according to patient size and/or use of iterative reconstruction technique. CONTRAST:  75mL OMNIPAQUE IOHEXOL 350 MG/ML SOLN COMPARISON:   12/30/2022 FINDINGS: Cardiovascular: Thoracic aorta shows mild atherosclerotic calcifications. No aneurysmal dilatation or dissection is noted. No cardiac enlargement is seen. Coronary calcifications are noted. The pulmonary artery shows a normal branching pattern bilaterally. No filling defect to suggest pulmonary embolism is seen. Mediastinum/Nodes: Thoracic inlet is within normal limits. No hilar or mediastinal adenopathy is noted. Stable distal paraesophageal node is noted measuring 8 mm in short axis. Lungs/Pleura: Lungs are well aerated bilaterally. Diffuse emphysematous changes are seen. No acute infiltrate is seen. Post radiation changes are noted in the medial aspect of the right upper and lower lobes stable from the prior study. No sizable effusion is seen. No parenchymal nodules are noted. Upper Abdomen: Visualized upper abdomen shows e acute abnormality. Musculoskeletal: Mild degenerative changes of  the thoracic spine are noted. No acute rib abnormality is seen. Review of the MIP images confirms the above findings. IMPRESSION: No evidence of pulmonary emboli. Post radiation changes in the medial right hemithorax. These are stable from the prior exam. Stable distal paraesophageal lymph node. Aortic Atherosclerosis (ICD10-I70.0). Electronically Signed   By: Alcide Clever M.D.   On: 01/07/2023 20:55   MR BRAIN WO CONTRAST Result Date: 01/07/2023 CLINICAL DATA:  Neuro deficit with acute stroke suspected. EXAM: MRI HEAD WITHOUT CONTRAST TECHNIQUE: Multiplanar, multiecho pulse sequences of the brain and surrounding structures were obtained without intravenous contrast. COMPARISON:  Head CT 01/04/2023 FINDINGS: Brain: No acute infarction, hemorrhage, hydrocephalus, extra-axial collection or mass lesion. Extensive chronic infarction in the right cerebellum and to a lesser extent in the right occipital lobe. Ischemic gliosis in the periventricular white matter is mild for age. Mild cerebral volume loss, more  pronounced volume loss in the posterior fossa, stable from a 2022 brain MRI. Vascular: Major flow voids are stable, including diminutive right vertebral artery Skull and upper cervical spine: Normal marrow signal Sinuses/Orbits: Unremarkable IMPRESSION: 1. No acute finding. 2. Chronic right cerebellar and right occipital infarcts. Electronically Signed   By: Tiburcio Pea M.D.   On: 01/07/2023 19:43   DG Hip Unilat W or Wo Pelvis 2-3 Views Left Result Date: 01/07/2023 CLINICAL DATA:  Fall and left hip pain. EXAM: DG HIP (WITH OR WITHOUT PELVIS) 2-3V LEFT COMPARISON:  None Available. FINDINGS: There is no acute fracture or dislocation. The bones are osteopenic. Mild bilateral hip arthritic changes. The soft tissues are unremarkable. IMPRESSION: 1. No acute fracture or dislocation. 2. Osteopenia. Electronically Signed   By: Elgie Collard M.D.   On: 01/07/2023 15:42   US ABDOMEN LIMITED RUQ (LIVER/GB) Result Date: 01/04/2023 CLINICAL DATA:  Right upper quadrant pain, vomiting EXAM: ULTRASOUND ABDOMEN LIMITED RIGHT UPPER QUADRANT COMPARISON:  02/07/2022 FINDINGS: Gallbladder: Nonshadowing echogenic material layering dependently in the gallbladder consistent with gallbladder sludge. No shadowing gallstones. No gallbladder wall thickening or pericholecystic fluid. Negative sonographic Murphy sign. Common bile duct: Diameter: 5 mm Liver: Heterogeneous increased liver echotexture may reflect hepatic steatosis. No focal liver abnormalities or intrahepatic duct dilation. Portal vein is patent on color Doppler imaging with normal direction of blood flow towards the liver. Other: None. IMPRESSION: 1. Gallbladder sludge, no evidence of shadowing gallstones or acute cholecystitis. 2. Heterogeneous increased liver echotexture consistent with hepatic steatosis. Electronically Signed   By: Sharlet Salina M.D.   On: 01/04/2023 20:05   CT CHEST WO CONTRAST Result Date: 01/04/2023 CLINICAL DATA:  Esophageal ulcer.  History of non-small cell lung cancer. EXAM: CT CHEST WITHOUT CONTRAST TECHNIQUE: Multidetector CT imaging of the chest was performed following the standard protocol without IV contrast. RADIATION DOSE REDUCTION: This exam was performed according to the departmental dose-optimization program which includes automated exposure control, adjustment of the mA and/or kV according to patient size and/or use of iterative reconstruction technique. COMPARISON:  06/20/2022 FINDINGS: Cardiovascular: The heart is normal in size. Trace pericardial fluid. No evidence of thoracic aortic aneurysm. Moderate coronary atherosclerosis of the LAD and left circumflex. Mediastinum/Nodes: 8 mm short axis distal paraesophageal node (series 2/image 81), previously 10 mm. Visualized thyroid is unremarkable. Lungs/Pleura: Radiation changes in the medial right hemithorax. No new/suspicious pulmonary nodules. Mild centrilobular predominant. No focal consolidation. No pleural effusion or pneumothorax. Upper Abdomen: Visualized upper abdomen is grossly unremarkable. Musculoskeletal: Mild superior endplate changes at L1, chronic. IMPRESSION: Radiation changes in the medial right hemithorax. No findings  specific for recurrent or metastatic disease. 8 mm short axis distal paraesophageal node, mildly improved. Attention on follow-up is suggested. Aortic Atherosclerosis (ICD10-I70.0). Electronically Signed   By: Charline Bills M.D.   On: 01/04/2023 19:50   DG Forearm Right Result Date: 01/04/2023 CLINICAL DATA:  Larey Seat, right forearm pain and swelling EXAM: RIGHT FOREARM - 2 VIEW COMPARISON:  None Available. FINDINGS: Frontal and lateral views of the right forearm are obtained. No acute displaced fracture. Alignment is anatomic. There is mild osteoarthritis of the right elbow. Diffuse soft tissue swelling throughout the forearm. IMPRESSION: 1. Diffuse soft tissue swelling.  No acute displaced fracture. Electronically Signed   By: Sharlet Salina M.D.    On: 01/04/2023 19:48   DG Chest 1 View Result Date: 01/04/2023 CLINICAL DATA:  Right upper quadrant pain, vomiting, cough, fell, history of right-sided small cell lung cancer EXAM: CHEST  1 VIEW COMPARISON:  12/25/2022, 12/30/2022 FINDINGS: Single frontal view of the chest demonstrates a stable enlarged cardiac silhouette. Chronic post therapeutic changes are seen at the right hilum. No acute airspace disease, effusion, or pneumothorax. No acute bony abnormalities. IMPRESSION: 1. Chronic post therapeutic changes at the right hilum. 2. No acute intrathoracic process. Electronically Signed   By: Sharlet Salina M.D.   On: 01/04/2023 19:47   CT Head Wo Contrast Result Date: 01/04/2023 CLINICAL DATA:  Trauma EXAM: CT HEAD WITHOUT CONTRAST CT CERVICAL SPINE WITHOUT CONTRAST TECHNIQUE: Multidetector CT imaging of the head and cervical spine was performed following the standard protocol without intravenous contrast. Multiplanar CT image reconstructions of the cervical spine were also generated. RADIATION DOSE REDUCTION: This exam was performed according to the departmental dose-optimization program which includes automated exposure control, adjustment of the mA and/or kV according to patient size and/or use of iterative reconstruction technique. COMPARISON:  CT head dated 02/07/2022 FINDINGS: CT HEAD FINDINGS Brain: No evidence of acute infarction, hemorrhage, hydrocephalus, extra-axial collection or mass lesion/mass effect. Encephalomalacic changes related to prior right cerebellar infarct. Subcortical white matter and periventricular small vessel ischemic changes. Vascular: No hyperdense vessel or unexpected calcification. Skull: Normal. Negative for fracture or focal lesion. Sinuses/Orbits: The visualized paranasal sinuses are essentially clear. The mastoid air cells are unopacified. Other: None. CT CERVICAL SPINE FINDINGS Alignment: Reversal of the normal lower cervical lordosis. Skull base and vertebrae: No  acute fracture. No primary bone lesion or focal pathologic process. Soft tissues and spinal canal: No prevertebral fluid or swelling. No visible canal hematoma. Disc levels: Degenerative changes of the mid/lower cervical spine. Spinal canal is patent. Upper chest: Radiation changes in the medial right upper lung, unchanged. Other: Visualized thyroid is unremarkable. IMPRESSION: No acute intracranial abnormality. Small vessel ischemic changes. Prior right cerebellar infarct. No traumatic injury to the cervical spine. Degenerative changes. Electronically Signed   By: Charline Bills M.D.   On: 01/04/2023 19:45   CT Cervical Spine Wo Contrast Result Date: 01/04/2023 CLINICAL DATA:  Trauma EXAM: CT HEAD WITHOUT CONTRAST CT CERVICAL SPINE WITHOUT CONTRAST TECHNIQUE: Multidetector CT imaging of the head and cervical spine was performed following the standard protocol without intravenous contrast. Multiplanar CT image reconstructions of the cervical spine were also generated. RADIATION DOSE REDUCTION: This exam was performed according to the departmental dose-optimization program which includes automated exposure control, adjustment of the mA and/or kV according to patient size and/or use of iterative reconstruction technique. COMPARISON:  CT head dated 02/07/2022 FINDINGS: CT HEAD FINDINGS Brain: No evidence of acute infarction, hemorrhage, hydrocephalus, extra-axial collection or mass lesion/mass effect. Encephalomalacic  changes related to prior right cerebellar infarct. Subcortical white matter and periventricular small vessel ischemic changes. Vascular: No hyperdense vessel or unexpected calcification. Skull: Normal. Negative for fracture or focal lesion. Sinuses/Orbits: The visualized paranasal sinuses are essentially clear. The mastoid air cells are unopacified. Other: None. CT CERVICAL SPINE FINDINGS Alignment: Reversal of the normal lower cervical lordosis. Skull base and vertebrae: No acute fracture. No  primary bone lesion or focal pathologic process. Soft tissues and spinal canal: No prevertebral fluid or swelling. No visible canal hematoma. Disc levels: Degenerative changes of the mid/lower cervical spine. Spinal canal is patent. Upper chest: Radiation changes in the medial right upper lung, unchanged. Other: Visualized thyroid is unremarkable. IMPRESSION: No acute intracranial abnormality. Small vessel ischemic changes. Prior right cerebellar infarct. No traumatic injury to the cervical spine. Degenerative changes. Electronically Signed   By: Charline Bills M.D.   On: 01/04/2023 19:45   DG Chest 2 View Result Date: 12/25/2022 CLINICAL DATA:  Possible aspiration. EXAM: CHEST - 2 VIEW COMPARISON:  CT chest dated Jun 20, 2022. Chest x-ray dated February 07, 2022. FINDINGS: Stable cardiomediastinal silhouette with normal heart size. Similar post treatment changes with scarring in the parahilar right lung. No focal consolidation, pleural effusion, or pneumothorax. No acute osseous abnormality. Chronic T12 and L1 compression deformities again noted. IMPRESSION: 1. No acute cardiopulmonary disease. Electronically Signed   By: Obie Dredge M.D.   On: 12/25/2022 11:15   PCV ECHOCARDIOGRAM COMPLETE Result Date: 12/19/2022 Images from the original result were not included. Reason for Visit  INDICATIONS:  I25.10 Echocardiogram: An echocardiogram in (2-d) mode was performed and in Doppler mode with color flow velocity mapping was performed. ventricular septum thickness 1.16 cm, L ventricular posterior wall thickness (diastole) 1.03 cm, left atrium size 3.4 cm, aortic root diameter 3.4 cm, L ventricle diastolic dimension 3.74 cm, L ventricle systolic dimension 1.71, L ventricle ejection fraction 85.7 %, and LV fractional shortening 54.3 % L ventricular outflow tract internal diameter 3.0 cm, L ventricular outflow tract flow velocity .857 m/s, aortic valve cusps 1.9 cm , aortic valve flow velocity 1.28 (m/sec),  aortic valve systolic calculated mean flow gradient 3 mmHg, mitral valve diastolic peak flow velocity E 1.61 m/sec, and mitral valve diastolic peak flow E/A ratio 7.5 Mitral valve has mild regurgitation ASSESSMENT Suboptimal study due to poor windows. Normal chamber sizes. Normal left ventricular systolic function. Mild left ventricular hypertrophy with GRADE 2 (psuedonormalization) diastolic dysfunction. Normal right ventricular systolic function. Normal right ventricular diastolic function. Normal left ventricular wall motion. Normal right ventricular wall motion. Normal pulmonary artery pressure. Mild mitral regurgitation. No pericardial effusion. Mild left ventricle hypertrophy    Microbiology: Results for orders placed or performed during the hospital encounter of 01/04/23  SARS Coronavirus 2 by RT PCR (hospital order, performed in [facility] hospital lab) *cepheid single result test* Anterior Nasal Swab     Status: None   Collection Time: 01/04/23  8:06 PM   Specimen: Anterior Nasal Swab  Result Value Ref Range Status   SARS Coronavirus 2 by RT PCR NEGATIVE NEGATIVE Final    Comment: (NOTE) SARS-CoV-2 target nucleic acids are NOT DETECTED.  The SARS-CoV-2 RNA is generally detectable in upper and lower respiratory specimens during the acute phase of infection. The lowest concentration of SARS-CoV-2 viral copies this assay can detect is 250 copies / mL. A negative result does not preclude SARS-CoV-2 infection and should not be used as the sole basis for treatment or other patient management decisions.  A negative result may occur with improper specimen collection / handling, submission of specimen other than nasopharyngeal swab, presence of viral mutation(s) within the areas targeted by this assay, and inadequate number of viral copies (<250 copies / mL). A negative result must be combined with clinical observations, patient history, and epidemiological information.  Fact Sheet for  Patients:   RoadLapTop.co.za  Fact Sheet for Healthcare Providers: http://kim-miller.com/  This test is not yet approved or  cleared by the [place] FDA and has been authorized for detection and/or diagnosis of SARS-CoV-2 by FDA under an Emergency Use Authorization (EUA).  This EUA will remain in effect (meaning this test can be used) for the duration of the COVID-19 declaration under Section 564(b)(1) of the Act, 21 U.S.C. section 360bbb-3(b)(1), unless the authorization is terminated or revoked sooner.  Performed at [facility], [address] [place], [place] [address]     Labs: CBC: Recent Labs  Lab 01/07/23 1427 01/08/23 0328 01/09/23 0535 01/11/23 0838 01/13/23 0636  WBC 7.7 6.3 7.4 7.4 6.7  NEUTROABS  --   --   --  5.3  --   HGB 11.1* 10.6* 10.8* 10.7* 10.4*  HCT 34.8* 33.2* 33.5* 34.0* 33.2*  MCV 85.3 83.8 82.7 85.2 84.1  PLT 227 199 207 226 232   Basic Metabolic Panel: Recent Labs  Lab 01/07/23 1427 01/08/23 0328 01/09/23 0535 01/11/23 0838 01/13/23 0636  NA 134* 134* 135 136 136  K 4.0 4.1 3.8 4.0 3.8  CL 100 101 101 99 99  CO2 21* 24 24 26 25   GLUCOSE 137* 147* 126* 142* 148*  BUN 14 16 9 15 21   CREATININE 0.92 0.89 0.65 0.75 0.92  CALCIUM 8.6* 8.5* 8.4* 9.1 8.7*   Liver Function Tests: Recent Labs  Lab 01/07/23 1832 01/13/23 0636  AST 29 28  ALT 16 15  ALKPHOS 80 74  BILITOT 0.6 0.5  PROT 7.3 6.8  ALBUMIN 3.9 3.8   CBG: Recent Labs  Lab 01/13/23 0721 01/13/23 1244 01/13/23 1619 01/13/23 2115 01/14/23 0753  GLUCAP 136* 161* 170* 172* 122*    Discharge time spent: greater than 30 minutes.  Signed: Delfino Lovett, MD [facility] 01/14/2023

## 2023-01-15 ENCOUNTER — Telehealth: Payer: Self-pay | Admitting: Oncology

## 2023-01-15 DIAGNOSIS — E119 Type 2 diabetes mellitus without complications: Secondary | ICD-10-CM | POA: Diagnosis not present

## 2023-01-15 DIAGNOSIS — E039 Hypothyroidism, unspecified: Secondary | ICD-10-CM | POA: Diagnosis not present

## 2023-01-15 DIAGNOSIS — I2510 Atherosclerotic heart disease of native coronary artery without angina pectoris: Secondary | ICD-10-CM | POA: Diagnosis not present

## 2023-01-15 DIAGNOSIS — J449 Chronic obstructive pulmonary disease, unspecified: Secondary | ICD-10-CM | POA: Diagnosis not present

## 2023-01-15 DIAGNOSIS — I10 Essential (primary) hypertension: Secondary | ICD-10-CM | POA: Diagnosis not present

## 2023-01-15 DIAGNOSIS — E785 Hyperlipidemia, unspecified: Secondary | ICD-10-CM | POA: Diagnosis not present

## 2023-01-15 DIAGNOSIS — K219 Gastro-esophageal reflux disease without esophagitis: Secondary | ICD-10-CM | POA: Diagnosis not present

## 2023-01-15 DIAGNOSIS — I214 Non-ST elevation (NSTEMI) myocardial infarction: Secondary | ICD-10-CM | POA: Diagnosis not present

## 2023-01-15 NOTE — Telephone Encounter (Signed)
Vivia Birmingham from [facility] called to make pt hospital follow up, please advise on when to schedule this appt please.   [phone] is the callback number ask for [facility].   Appts need to be scheduled before 2pm due to transportation

## 2023-01-16 DIAGNOSIS — G8929 Other chronic pain: Secondary | ICD-10-CM | POA: Diagnosis not present

## 2023-01-16 NOTE — Consult Note (Signed)
[facility] Liaison Note  01/16/2023  Yvette Riley [birth year] [id number]  Location: RN Hospital Liaison screened the patient remotely at [facility].  Insurance: [employer]   Yvette Riley is a 67 y.o. female who is a Primary Care Patient of Simpson-Tarokh, Geoffery Spruce, DO The patient was screened for  day readmission hospitalization with noted extreme risk score for unplanned readmission risk with 1 IP/1 ED in 6 months.  The patient was assessed for potential Care Management service needs for post hospital transition for care coordination. Review of patient's electronic medical record reveals patient due to a fall. Pt transition to SNF level of care for ongoing rehabilitation. Facility will continue to address pt's needs.   [facility] Care Management/Population Health does not replace or interfere with any arrangements made by the Inpatient Transition of Care team.   For questions contact:   Elliot Cousin, RN, [facility] Liaison [place]   [facility], Population Health Office Hours [email or web address]  8:00 am-6:00 pm Direct Dial: [phone] mobile [phone] [Office toll free line] Office Hours are M-F 8:30 - 5 pm [email or web address]

## 2023-01-20 ENCOUNTER — Ambulatory Visit (INDEPENDENT_AMBULATORY_CARE_PROVIDER_SITE_OTHER): Payer: 59 | Admitting: Cardiovascular Disease

## 2023-01-20 ENCOUNTER — Encounter: Payer: Self-pay | Admitting: Cardiovascular Disease

## 2023-01-20 VITALS — BP 100/89 | HR 120 | Ht <= 58 in | Wt 187.0 lb

## 2023-01-20 DIAGNOSIS — R0789 Other chest pain: Secondary | ICD-10-CM | POA: Diagnosis not present

## 2023-01-20 DIAGNOSIS — I5033 Acute on chronic diastolic (congestive) heart failure: Secondary | ICD-10-CM

## 2023-01-20 DIAGNOSIS — I2510 Atherosclerotic heart disease of native coronary artery without angina pectoris: Secondary | ICD-10-CM | POA: Diagnosis not present

## 2023-01-20 DIAGNOSIS — R0602 Shortness of breath: Secondary | ICD-10-CM | POA: Diagnosis not present

## 2023-01-20 DIAGNOSIS — G4733 Obstructive sleep apnea (adult) (pediatric): Secondary | ICD-10-CM | POA: Diagnosis not present

## 2023-01-20 DIAGNOSIS — J449 Chronic obstructive pulmonary disease, unspecified: Secondary | ICD-10-CM | POA: Diagnosis not present

## 2023-01-20 DIAGNOSIS — I4711 Inappropriate sinus tachycardia, so stated: Secondary | ICD-10-CM

## 2023-01-20 MED ORDER — METOPROLOL TARTRATE 50 MG PO TABS: 50.0000 mg | ORAL_TABLET | Freq: Two times a day (BID) | ORAL | 11 refills | Status: DC

## 2023-01-20 MED ORDER — RANOLAZINE ER 500 MG PO TB12: 500.0000 mg | ORAL_TABLET | Freq: Two times a day (BID) | ORAL | 2 refills | Status: DC

## 2023-01-20 MED ORDER — DAPAGLIFLOZIN PROPANEDIOL 10 MG PO TABS: 10.0000 mg | ORAL_TABLET | Freq: Every day | ORAL | 3 refills | Status: DC

## 2023-01-20 NOTE — Progress Notes (Signed)
Cardiology Office Note   Date:  01/20/2023   ID:  Yvette Riley, DOB [birth year], MRN [id number]  PCP:  Sherol Dade, DO  Cardiologist:  Adrian Blackwater, MD      History of Present Illness: Yvette Riley is a 67 y.o. female who presents for No chief complaint on file.   Chest Pain       Past Medical History:  Diagnosis Date   Allergy    Anemia    Anxiety    Arthritis    Asthma    Blood transfusion without reported diagnosis    C. difficile diarrhea 04/07/2016   CAP (community acquired pneumonia) 03/21/2015   Combined forms of age-related cataract of both eyes 05/06/2016   COPD (chronic obstructive pulmonary disease) (HCC)    CVA (cerebral vascular accident) (HCC) 06/17/2018   Diabetes mellitus without complication (HCC)    Displacement of lumbar intervertebral disc without myelopathy    Emphysema of lung (HCC)    GERD (gastroesophageal reflux disease)    Glaucoma    History of chicken pox    Hyperlipidemia    Hypertension    NSTEMI (non-ST elevated myocardial infarction) (HCC) 01/07/2023   Pneumonia 03/29/2015   Sepsis (HCC) 03/17/2016   Shortness of breath    Small cell lung cancer (HCC)      Past Surgical History:  Procedure Laterality Date   BALLOON DILATION  11/20/2022   Procedure: BALLOON DILATION;  Surgeon: Norma Fredrickson, Boykin Nearing, MD;  Location: [facility] ENDOSCOPY;  Service: Gastroenterology;;   BIOPSY  11/20/2022   Procedure: BIOPSY;  Surgeon: Toledo, Boykin Nearing, MD;  Location: [facility] ENDOSCOPY;  Service: Gastroenterology;;   CESAREAN SECTION     CORONARY STENT INTERVENTION N/A 01/08/2023   Procedure: CORONARY STENT INTERVENTION;  Surgeon: Alwyn Pea, MD;  Location: [facility] INVASIVE CV LAB;  Service: Cardiovascular;  Laterality: N/A;   ESOPHAGOGASTRODUODENOSCOPY N/A 05/17/2022   Procedure: ESOPHAGOGASTRODUODENOSCOPY (EGD);  Surgeon: [name], MD;  Location: [facility] ENDOSCOPY;  Service: Endoscopy;  Laterality: N/A;   ESOPHAGOGASTRODUODENOSCOPY N/A  07/10/2022   Procedure: ESOPHAGOGASTRODUODENOSCOPY (EGD);  Surgeon: Toledo, Boykin Nearing, MD;  Location: [facility] ENDOSCOPY;  Service: Gastroenterology;  Laterality: N/A;   ESOPHAGOGASTRODUODENOSCOPY (EGD) WITH PROPOFOL N/A 02/08/2022   Procedure: ESOPHAGOGASTRODUODENOSCOPY (EGD) WITH PROPOFOL;  Surgeon: Wyline Mood, MD;  Location: [facility] ENDOSCOPY;  Service: Gastroenterology;  Laterality: N/A;   ESOPHAGOGASTRODUODENOSCOPY (EGD) WITH PROPOFOL N/A 11/20/2022   Procedure: ESOPHAGOGASTRODUODENOSCOPY (EGD) WITH PROPOFOL;  Surgeon: Toledo, Boykin Nearing, MD;  Location: [facility] ENDOSCOPY;  Service: Gastroenterology;  Laterality: N/A;   ESOPHAGOGASTRODUODENOSCOPY (EGD) WITH PROPOFOL N/A 12/25/2022   Procedure: ESOPHAGOGASTRODUODENOSCOPY (EGD) WITH PROPOFOL;  Surgeon: Toney Reil, MD;  Location: [facility] ENDOSCOPY;  Service: Gastroenterology;  Laterality: N/A;   LEFT HEART CATH AND CORONARY ANGIOGRAPHY N/A 01/08/2023   Procedure: LEFT HEART CATH AND CORONARY ANGIOGRAPHY;  Surgeon: Laurier Nancy, MD;  Location: [facility] INVASIVE CV LAB;  Service: Cardiovascular;  Laterality: N/A;   TUBAL LIGATION     UPPER GI ENDOSCOPY       Current Outpatient Medications  Medication Sig Dispense Refill   dapagliflozin propanediol (FARXIGA) 10 MG TABS tablet Take 1 tablet (10 mg total) by mouth daily before breakfast. 30 tablet 3   metoprolol tartrate (LOPRESSOR) 50 MG tablet Take 1 tablet (50 mg total) by mouth 2 (two) times daily. 60 tablet 11   ranolazine (RANEXA) 500 MG 12 hr tablet Take 1 tablet (500 mg total) by mouth 2 (two) times daily. 60 tablet 2   albuterol (PROVENTIL) (  2.5 MG/3ML) 0.083% nebulizer solution Take 2.5 mg by nebulization every 6 (six) hours.     aspirin EC 81 MG tablet Take 1 tablet (81 mg total) by mouth daily. Swallow whole. 30 tablet 12   busPIRone (BUSPAR) 5 MG tablet Take 5 mg by mouth 3 (three) times daily.     Cholecalciferol (VITAMIN D) 50 MCG (2000 UT) CAPS Take 1,000 Units by mouth daily.      clopidogrel (PLAVIX) 75 MG tablet Take 1 tablet (75 mg total) by mouth daily. 90 tablet 4   diclofenac Sodium (VOLTAREN) 1 % GEL Apply 4 g topically 4 (four) times daily. (Patient taking differently: Apply 2 g topically every 6 (six) hours as needed (pain). Apply to back of neck, not to exceed 16 grams per day.) 350 g 3   econazole nitrate 1 % cream Apply 1 Application topically 2 (two) times daily as needed.     ezetimibe (ZETIA) 10 MG tablet Take 10 mg by mouth daily.     Fluticasone-Umeclidin-Vilant (TRELEGY ELLIPTA) 200-62.5-25 MCG/ACT AEPB Inhale 1 puff into the lungs daily. 30 each 12   gabapentin (NEURONTIN) 300 MG capsule Take 300 mg by mouth 2 (two) times daily.     hydrocortisone cream 1 % Apply 1 Application topically every 6 (six) hours as needed for itching.     ketoconazole (NIZORAL) 2 % shampoo Apply 1 Application topically 2 (two) times a week. Apply to scalp twice a week during shower on Wednesday and Saturday. Lather and allow to sit for 5 minutes before rinsing.     levothyroxine (SYNTHROID) 50 MCG tablet Take 50 mcg by mouth daily before breakfast.     loperamide (IMODIUM) 2 MG capsule Take by mouth as needed for diarrhea or loose stools.     Magnesium 400 MG TABS Take 1 tablet by mouth 2 (two) times daily.     metFORMIN (GLUCOPHAGE) 500 MG tablet Take 500 mg by mouth 2 (two) times daily with a meal.     miconazole (MICOTIN) 2 % powder Apply 1 Application topically daily. Apply to affected areas on abdomen daily.     omeprazole (PRILOSEC) 40 MG capsule Take 1 capsule (40 mg total) by mouth 2 (two) times daily. Take in the morning and at dinnertime. 120 capsule 0   ondansetron (ZOFRAN) 4 MG tablet Take 4 mg by mouth every 6 (six) hours as needed for nausea.     senna (SENOKOT) 8.6 MG TABS tablet Take 2 tablets by mouth daily.     sertraline (ZOLOFT) 50 MG tablet Take 1 tablet (50 mg total) by mouth daily. 90 tablet 4   vitamin B-12 (CYANOCOBALAMIN) 1000 MCG tablet Take 1 tablet  (1,000 mcg total) by mouth daily. 30 tablet 0   white petrolatum ointment Apply 1 application  topically daily. After washing with soap and water, apply petrolatum to left lateral back (biopsy site) once a day and cover with Band-Aid.     zaleplon (SONATA) 5 MG capsule Take 5 mg by mouth at bedtime.     No current facility-administered medications for this visit.    Allergies:   Other, Penicillins, Yellow jacket venom [bee venom], Codeine, Crestor [rosuvastatin], Gemfibrozil, Trazodone and nefazodone, and Lipitor [atorvastatin]    Social History:   reports that she quit smoking about 6 years ago. Her smoking use included cigarettes. She started smoking about 51 years ago. She has a 11.3 pack-year smoking history. She has never used smokeless tobacco. She reports that she does not drink  alcohol and does not use drugs.   Family History:  family history includes COPD in her brother; Cancer in her maternal aunt; Heart disease in her brother and mother; Heart failure in her mother; Stroke in her mother.    ROS:     Review of Systems  Constitutional: Negative.   HENT: Negative.    Eyes: Negative.   Respiratory: Negative.    Cardiovascular:  Positive for chest pain.  Gastrointestinal: Negative.   Genitourinary: Negative.   Musculoskeletal: Negative.   Skin: Negative.   Neurological: Negative.   Endo/Heme/Allergies: Negative.   Psychiatric/Behavioral: Negative.    All other systems reviewed and are negative.     All other systems are reviewed and negative.    PHYSICAL EXAM: VS:  BP 100/89   Pulse (!) 120   Ht 4\' 9"  (1.448 m)   Wt 187 lb (84.8 kg)   SpO2 95%   BMI 40.47 kg/m  , BMI Body mass index is 40.47 kg/m. Last weight:  Wt Readings from Last 3 Encounters:  01/20/23 187 lb (84.8 kg)  01/08/23 179 lb 14.3 oz (81.6 kg)  12/25/22 179 lb 14.3 oz (81.6 kg)     Physical Exam Constitutional:      Appearance: Normal appearance.  Cardiovascular:     Rate and Rhythm: Normal  rate and regular rhythm.     Heart sounds: Normal heart sounds.  Pulmonary:     Effort: Pulmonary effort is normal.     Breath sounds: Normal breath sounds.  Musculoskeletal:     Right lower leg: No edema.     Left lower leg: No edema.  Neurological:     Mental Status: She is alert.       EKG:   Recent Labs: 01/08/2023: TSH 7.022 01/13/2023: ALT 15; BUN 21; Creatinine, Ser 0.92; Hemoglobin 10.4; Platelets 232; Potassium 3.8; Sodium 136    Lipid Panel    Component Value Date/Time   CHOL 167 01/08/2023 0328   CHOL 228 (H) 06/19/2020 1405   TRIG 561 (H) 01/08/2023 0328   HDL 31 (L) 01/08/2023 0328   HDL 35 (L) 06/19/2020 1405   CHOLHDL 5.4 01/08/2023 0328   VLDL UNABLE TO CALCULATE IF TRIGLYCERIDE OVER 400 mg/dL 53/66/4403 4742   LDLCALC UNABLE TO CALCULATE IF TRIGLYCERIDE OVER 400 mg/dL 59/56/3875 6433   LDLCALC 82 06/19/2020 1405   LDLDIRECT 81 01/08/2023 0328      Other studies Reviewed: Additional studies/ records that were reviewed today include:  Review of the above records demonstrates:       No data to display            ASSESSMENT AND PLAN:    ICD-10-CM   1. Atherosclerosis of coronary artery of native heart without angina pectoris, unspecified vessel or lesion type  I25.10 metoprolol tartrate (LOPRESSOR) 50 MG tablet    ranolazine (RANEXA) 500 MG 12 hr tablet    dapagliflozin propanediol (FARXIGA) 10 MG TABS tablet   had PCI of LAD  for 95%, and still has OM high grade disease needsing PCI 2 weeks    2. Inappropriate sinus tachycardia (HCC)  I47.11 metoprolol tartrate (LOPRESSOR) 50 MG tablet    ranolazine (RANEXA) 500 MG 12 hr tablet    dapagliflozin propanediol (FARXIGA) 10 MG TABS tablet   increase metoprolol 50 bid    3. Chest pain, non-cardiac  R07.89 metoprolol tartrate (LOPRESSOR) 50 MG tablet    ranolazine (RANEXA) 500 MG 12 hr tablet    dapagliflozin propanediol (FARXIGA) 10  MG TABS tablet    4. SOB (shortness of breath)  R06.02  metoprolol tartrate (LOPRESSOR) 50 MG tablet    ranolazine (RANEXA) 500 MG 12 hr tablet    dapagliflozin propanediol (FARXIGA) 10 MG TABS tablet    5. Chronic obstructive pulmonary disease, unspecified COPD type (HCC)  J44.9 metoprolol tartrate (LOPRESSOR) 50 MG tablet    ranolazine (RANEXA) 500 MG 12 hr tablet    dapagliflozin propanediol (FARXIGA) 10 MG TABS tablet    6. Obstructive sleep apnea syndrome  G47.33 metoprolol tartrate (LOPRESSOR) 50 MG tablet    ranolazine (RANEXA) 500 MG 12 hr tablet    dapagliflozin propanediol (FARXIGA) 10 MG TABS tablet    7. CHF (congestive heart failure), NYHA class III, acute on chronic, diastolic (HCC)  I50.33 dapagliflozin propanediol (FARXIGA) 10 MG TABS tablet   add farxiag       Problem List Items Addressed This Visit       Cardiovascular and Mediastinum   Coronary atherosclerosis - Primary   Relevant Medications   metoprolol tartrate (LOPRESSOR) 50 MG tablet   ranolazine (RANEXA) 500 MG 12 hr tablet   dapagliflozin propanediol (FARXIGA) 10 MG TABS tablet     Respiratory   COPD (chronic obstructive pulmonary disease) (HCC) (Chronic)   Relevant Medications   metoprolol tartrate (LOPRESSOR) 50 MG tablet   ranolazine (RANEXA) 500 MG 12 hr tablet   dapagliflozin propanediol (FARXIGA) 10 MG TABS tablet   Other Visit Diagnoses       Inappropriate sinus tachycardia (HCC)       increase metoprolol 50 bid   Relevant Medications   metoprolol tartrate (LOPRESSOR) 50 MG tablet   ranolazine (RANEXA) 500 MG 12 hr tablet   dapagliflozin propanediol (FARXIGA) 10 MG TABS tablet     Chest pain, non-cardiac       Relevant Medications   metoprolol tartrate (LOPRESSOR) 50 MG tablet   ranolazine (RANEXA) 500 MG 12 hr tablet   dapagliflozin propanediol (FARXIGA) 10 MG TABS tablet     SOB (shortness of breath)       Relevant Medications   metoprolol tartrate (LOPRESSOR) 50 MG tablet   ranolazine (RANEXA) 500 MG 12 hr tablet   dapagliflozin  propanediol (FARXIGA) 10 MG TABS tablet     Obstructive sleep apnea syndrome       Relevant Medications   metoprolol tartrate (LOPRESSOR) 50 MG tablet   ranolazine (RANEXA) 500 MG 12 hr tablet   dapagliflozin propanediol (FARXIGA) 10 MG TABS tablet     CHF (congestive heart failure), NYHA class III, acute on chronic, diastolic (HCC)       add farxiag   Relevant Medications   metoprolol tartrate (LOPRESSOR) 50 MG tablet   ranolazine (RANEXA) 500 MG 12 hr tablet   dapagliflozin propanediol (FARXIGA) 10 MG TABS tablet          Disposition:   Return in about 2 weeks (around 02/03/2023).    Total time spent: 30 minutes  Signed,  Adrian Blackwater, MD  01/20/2023 10:51 AM    [facility]

## 2023-02-03 ENCOUNTER — Ambulatory Visit (INDEPENDENT_AMBULATORY_CARE_PROVIDER_SITE_OTHER): Payer: 59 | Admitting: Cardiovascular Disease

## 2023-02-03 ENCOUNTER — Encounter: Payer: Self-pay | Admitting: Cardiovascular Disease

## 2023-02-03 VITALS — BP 129/72 | HR 111 | Ht <= 58 in | Wt 189.0 lb

## 2023-02-03 DIAGNOSIS — I663 Occlusion and stenosis of cerebellar arteries: Secondary | ICD-10-CM

## 2023-02-03 DIAGNOSIS — R0789 Other chest pain: Secondary | ICD-10-CM | POA: Diagnosis not present

## 2023-02-03 DIAGNOSIS — I2510 Atherosclerotic heart disease of native coronary artery without angina pectoris: Secondary | ICD-10-CM | POA: Diagnosis not present

## 2023-02-03 DIAGNOSIS — Z0130 Encounter for examination of blood pressure without abnormal findings: Secondary | ICD-10-CM

## 2023-02-03 DIAGNOSIS — G8929 Other chronic pain: Secondary | ICD-10-CM | POA: Diagnosis not present

## 2023-02-03 DIAGNOSIS — I5033 Acute on chronic diastolic (congestive) heart failure: Secondary | ICD-10-CM | POA: Diagnosis not present

## 2023-02-03 DIAGNOSIS — I4711 Inappropriate sinus tachycardia, so stated: Secondary | ICD-10-CM

## 2023-02-03 DIAGNOSIS — G4700 Insomnia, unspecified: Secondary | ICD-10-CM | POA: Diagnosis not present

## 2023-02-03 MED ORDER — METOPROLOL TARTRATE 100 MG PO TABS: 100.0000 mg | ORAL_TABLET | Freq: Two times a day (BID) | ORAL | 11 refills | Status: DC

## 2023-02-03 NOTE — Progress Notes (Signed)
 Cardiology Office Note   Date:  02/03/2023   ID:  PEYSON DELAO, DOB 03-Mar-1955, MRN [id number]  PCP:  Andi Jointer, DO  Cardiologist:  Denyse Bathe, MD      History of Present Illness: Yvette Riley is a 68 y.o. female who presents for No chief complaint on file.   Doing well      Past Medical History:  Diagnosis Date   Allergy    Anemia    Anxiety    Arthritis    Asthma    Blood transfusion without reported diagnosis    C. difficile diarrhea 04/07/2016   CAP (community acquired pneumonia) 03/21/2015   Combined forms of age-related cataract of both eyes 05/06/2016   COPD (chronic obstructive pulmonary disease) (HCC)    CVA (cerebral vascular accident) (HCC) 06/17/2018   Diabetes mellitus without complication (HCC)    Displacement of lumbar intervertebral disc without myelopathy    Emphysema of lung (HCC)    GERD (gastroesophageal reflux disease)    Glaucoma    History of chicken pox    Hyperlipidemia    Hypertension    NSTEMI (non-ST elevated myocardial infarction) (HCC) 01/07/2023   Pneumonia 03/29/2015   Sepsis (HCC) 03/17/2016   Shortness of breath    Small cell lung cancer (HCC)      Past Surgical History:  Procedure Laterality Date   BALLOON DILATION  11/20/2022   Procedure: BALLOON DILATION;  Surgeon: Aundria, Ladell POUR, MD;  Location: [facility] ENDOSCOPY;  Service: Gastroenterology;;   BIOPSY  11/20/2022   Procedure: BIOPSY;  Surgeon: Toledo, Ladell POUR, MD;  Location: [facility] ENDOSCOPY;  Service: Gastroenterology;;   CESAREAN SECTION     CORONARY STENT INTERVENTION N/A 01/08/2023   Procedure: CORONARY STENT INTERVENTION;  Surgeon: Florencio Cara BIRCH, MD;  Location: [facility] INVASIVE CV LAB;  Service: Cardiovascular;  Laterality: N/A;   ESOPHAGOGASTRODUODENOSCOPY N/A 05/17/2022   Procedure: ESOPHAGOGASTRODUODENOSCOPY (EGD);  Surgeon: Jinny Carmine, MD;  Location: [facility] ENDOSCOPY;  Service: Endoscopy;  Laterality: N/A;   ESOPHAGOGASTRODUODENOSCOPY N/A  07/10/2022   Procedure: ESOPHAGOGASTRODUODENOSCOPY (EGD);  Surgeon: Toledo, Ladell POUR, MD;  Location: [facility] ENDOSCOPY;  Service: Gastroenterology;  Laterality: N/A;   ESOPHAGOGASTRODUODENOSCOPY (EGD) WITH PROPOFOL  N/A 02/08/2022   Procedure: ESOPHAGOGASTRODUODENOSCOPY (EGD) WITH PROPOFOL ;  Surgeon: Therisa Bi, MD;  Location: [facility] ENDOSCOPY;  Service: Gastroenterology;  Laterality: N/A;   ESOPHAGOGASTRODUODENOSCOPY (EGD) WITH PROPOFOL  N/A 11/20/2022   Procedure: ESOPHAGOGASTRODUODENOSCOPY (EGD) WITH PROPOFOL ;  Surgeon: Toledo, Ladell POUR, MD;  Location: [facility] ENDOSCOPY;  Service: Gastroenterology;  Laterality: N/A;   ESOPHAGOGASTRODUODENOSCOPY (EGD) WITH PROPOFOL  N/A 12/25/2022   Procedure: ESOPHAGOGASTRODUODENOSCOPY (EGD) WITH PROPOFOL ;  Surgeon: Unk Corinn Skiff, MD;  Location: [facility] ENDOSCOPY;  Service: Gastroenterology;  Laterality: N/A;   LEFT HEART CATH AND CORONARY ANGIOGRAPHY N/A 01/08/2023   Procedure: LEFT HEART CATH AND CORONARY ANGIOGRAPHY;  Surgeon: Bathe Denyse LABOR, MD;  Location: [facility] INVASIVE CV LAB;  Service: Cardiovascular;  Laterality: N/A;   TUBAL LIGATION     UPPER GI ENDOSCOPY       Current Outpatient Medications  Medication Sig Dispense Refill   albuterol  (PROVENTIL ) (2.5 MG/3ML) 0.083% nebulizer solution Take 2.5 mg by nebulization every 6 (six) hours.     aspirin  EC 81 MG tablet Take 1 tablet (81 mg total) by mouth daily. Swallow whole. 30 tablet 12   busPIRone  (BUSPAR ) 5 MG tablet Take 5 mg by mouth 3 (three) times daily.     Cholecalciferol (VITAMIN D) 50 MCG (2000 UT) CAPS Take 1,000 Units by mouth daily.  clopidogrel  (PLAVIX ) 75 MG tablet Take 1 tablet (75 mg total) by mouth daily. 90 tablet 4   dapagliflozin  propanediol (FARXIGA ) 10 MG TABS tablet Take 1 tablet (10 mg total) by mouth daily before breakfast. 30 tablet 3   econazole nitrate 1 % cream Apply 1 Application topically 2 (two) times daily as needed.     ezetimibe  (ZETIA ) 10 MG tablet Take 10 mg by mouth  daily.     Fluticasone -Umeclidin-Vilant (TRELEGY ELLIPTA ) 200-62.5-25 MCG/ACT AEPB Inhale 1 puff into the lungs daily. 30 each 12   gabapentin  (NEURONTIN ) 300 MG capsule Take 300 mg by mouth 2 (two) times daily.     hydrocortisone cream 1 % Apply 1 Application topically every 6 (six) hours as needed for itching.     ketoconazole  (NIZORAL ) 2 % shampoo Apply 1 Application topically 2 (two) times a week. Apply to scalp twice a week during shower on Wednesday and Saturday. Lather and allow to sit for 5 minutes before rinsing.     levothyroxine  (SYNTHROID ) 50 MCG tablet Take 50 mcg by mouth daily before breakfast.     loperamide (IMODIUM) 2 MG capsule Take by mouth as needed for diarrhea or loose stools.     Magnesium 400 MG TABS Take 1 tablet by mouth 2 (two) times daily.     metFORMIN  (GLUCOPHAGE ) 500 MG tablet Take 500 mg by mouth 2 (two) times daily with a meal.     metoprolol  tartrate (LOPRESSOR ) 100 MG tablet Take 1 tablet (100 mg total) by mouth 2 (two) times daily. 60 tablet 11   miconazole (MICOTIN) 2 % powder Apply 1 Application topically daily. Apply to affected areas on abdomen daily.     omeprazole  (PRILOSEC) 40 MG capsule Take 1 capsule (40 mg total) by mouth 2 (two) times daily. Take in the morning and at dinnertime. 120 capsule 0   ondansetron  (ZOFRAN ) 4 MG tablet Take 4 mg by mouth every 6 (six) hours as needed for nausea.     ranolazine  (RANEXA ) 500 MG 12 hr tablet Take 1 tablet (500 mg total) by mouth 2 (two) times daily. 60 tablet 2   senna (SENOKOT) 8.6 MG TABS tablet Take 2 tablets by mouth daily.     sertraline  (ZOLOFT ) 50 MG tablet Take 1 tablet (50 mg total) by mouth daily. 90 tablet 4   vitamin B-12 (CYANOCOBALAMIN ) 1000 MCG tablet Take 1 tablet (1,000 mcg total) by mouth daily. 30 tablet 0   white petrolatum ointment Apply 1 application  topically daily. After washing with soap and water, apply petrolatum to left lateral back (biopsy site) once a day and cover with Band-Aid.      zaleplon  (SONATA ) 5 MG capsule Take 5 mg by mouth at bedtime.     diclofenac  Sodium (VOLTAREN ) 1 % GEL Apply 4 g topically 4 (four) times daily. (Patient taking differently: Apply 2 g topically every 6 (six) hours as needed (pain). Apply to back of neck, not to exceed 16 grams per day.) 350 g 3   No current facility-administered medications for this visit.    Allergies:   Other, Penicillins, Yellow jacket venom [bee venom], Codeine, Crestor [rosuvastatin], Gemfibrozil , Trazodone and nefazodone, and Lipitor [atorvastatin]    Social History:   reports that she quit smoking about 6 years ago. Her smoking use included cigarettes. She started smoking about 51 years ago. She has a 11.3 pack-year smoking history. She has never used smokeless tobacco. She reports that she does not drink alcohol and does not use  drugs.   Family History:  family history includes COPD in her brother; Cancer in her maternal aunt; Heart disease in her brother and mother; Heart failure in her mother; Stroke in her mother.    ROS:     Review of Systems  Constitutional: Negative.   HENT: Negative.    Eyes: Negative.   Respiratory: Negative.    Gastrointestinal: Negative.   Genitourinary: Negative.   Musculoskeletal: Negative.   Skin: Negative.   Neurological: Negative.   Endo/Heme/Allergies: Negative.   Psychiatric/Behavioral: Negative.    All other systems reviewed and are negative.     All other systems are reviewed and negative.    PHYSICAL EXAM: VS:  BP 129/72   Pulse (!) 111   Ht 4' 9 (1.448 m)   Wt 189 lb (85.7 kg)   SpO2 95%   BMI 40.90 kg/m  , BMI Body mass index is 40.9 kg/m. Last weight:  Wt Readings from Last 3 Encounters:  02/03/23 189 lb (85.7 kg)  01/20/23 187 lb (84.8 kg)  01/08/23 179 lb 14.3 oz (81.6 kg)     Physical Exam Constitutional:      Appearance: Normal appearance.  Cardiovascular:     Rate and Rhythm: Normal rate and regular rhythm.     Heart sounds: Normal heart  sounds.  Pulmonary:     Effort: Pulmonary effort is normal.     Breath sounds: Normal breath sounds.  Musculoskeletal:     Right lower leg: No edema.     Left lower leg: No edema.  Neurological:     Mental Status: She is alert.       EKG:   Recent Labs: 01/08/2023: TSH 7.022 01/13/2023: ALT 15; BUN 21; Creatinine, Ser 0.92; Hemoglobin 10.4; Platelets 232; Potassium 3.8; Sodium 136    Lipid Panel    Component Value Date/Time   CHOL 167 01/08/2023 0328   CHOL 228 (H) 06/19/2020 1405   TRIG 561 (H) 01/08/2023 0328   HDL 31 (L) 01/08/2023 0328   HDL 35 (L) 06/19/2020 1405   CHOLHDL 5.4 01/08/2023 0328   VLDL UNABLE TO CALCULATE IF TRIGLYCERIDE OVER 400 mg/dL 87/88/7975 9671   LDLCALC UNABLE TO CALCULATE IF TRIGLYCERIDE OVER 400 mg/dL 87/88/7975 9671   LDLCALC 82 06/19/2020 1405   LDLDIRECT 81 01/08/2023 0328      Other studies Reviewed: Additional studies/ records that were reviewed today include:  Review of the above records demonstrates:       No data to display            ASSESSMENT AND PLAN:    ICD-10-CM   1. Atherosclerosis of coronary artery of native heart without angina pectoris, unspecified vessel or lesion type  I25.10 metoprolol  tartrate (LOPRESSOR ) 100 MG tablet    2. Embolism of superior cerebellar artery  I66.3 metoprolol  tartrate (LOPRESSOR ) 100 MG tablet    3. Inappropriate sinus tachycardia (HCC)  I47.11 metoprolol  tartrate (LOPRESSOR ) 100 MG tablet   increase metoprolol  100 bid    4. CHF (congestive heart failure), NYHA class III, acute on chronic, diastolic (HCC)  I50.33 metoprolol  tartrate (LOPRESSOR ) 100 MG tablet    5. Chest pain, non-cardiac  R07.89 metoprolol  tartrate (LOPRESSOR ) 100 MG tablet       Problem List Items Addressed This Visit       Cardiovascular and Mediastinum   Coronary atherosclerosis - Primary   Relevant Medications   metoprolol  tartrate (LOPRESSOR ) 100 MG tablet   Embolism of superior cerebellar artery    Relevant  Medications   metoprolol  tartrate (LOPRESSOR ) 100 MG tablet   Other Visit Diagnoses       Inappropriate sinus tachycardia (HCC)       increase metoprolol  100 bid   Relevant Medications   metoprolol  tartrate (LOPRESSOR ) 100 MG tablet     CHF (congestive heart failure), NYHA class III, acute on chronic, diastolic (HCC)       Relevant Medications   metoprolol  tartrate (LOPRESSOR ) 100 MG tablet     Chest pain, non-cardiac       Relevant Medications   metoprolol  tartrate (LOPRESSOR ) 100 MG tablet          Disposition:   Return in about 5 weeks (around 03/10/2023).    Total time spent: 30 minutes  Signed,  Denyse Bathe, MD  02/03/2023 10:14 AM    [facility]

## 2023-02-06 DIAGNOSIS — Z79899 Other long term (current) drug therapy: Secondary | ICD-10-CM | POA: Diagnosis not present

## 2023-02-12 DIAGNOSIS — Z79899 Other long term (current) drug therapy: Secondary | ICD-10-CM | POA: Diagnosis not present

## 2023-02-13 DIAGNOSIS — E785 Hyperlipidemia, unspecified: Secondary | ICD-10-CM | POA: Diagnosis not present

## 2023-02-13 DIAGNOSIS — I2510 Atherosclerotic heart disease of native coronary artery without angina pectoris: Secondary | ICD-10-CM | POA: Diagnosis not present

## 2023-02-13 DIAGNOSIS — J449 Chronic obstructive pulmonary disease, unspecified: Secondary | ICD-10-CM | POA: Diagnosis not present

## 2023-02-24 DIAGNOSIS — E785 Hyperlipidemia, unspecified: Secondary | ICD-10-CM | POA: Diagnosis not present

## 2023-03-10 ENCOUNTER — Ambulatory Visit (INDEPENDENT_AMBULATORY_CARE_PROVIDER_SITE_OTHER): Payer: 59 | Admitting: Cardiovascular Disease

## 2023-03-10 ENCOUNTER — Encounter: Payer: Self-pay | Admitting: Cardiovascular Disease

## 2023-03-10 ENCOUNTER — Telehealth: Payer: Self-pay

## 2023-03-10 VITALS — BP 110/72 | HR 111 | Ht 59.0 in | Wt 182.0 lb

## 2023-03-10 DIAGNOSIS — G4733 Obstructive sleep apnea (adult) (pediatric): Secondary | ICD-10-CM | POA: Diagnosis not present

## 2023-03-10 DIAGNOSIS — I5033 Acute on chronic diastolic (congestive) heart failure: Secondary | ICD-10-CM | POA: Diagnosis not present

## 2023-03-10 DIAGNOSIS — R000 Tachycardia, unspecified: Secondary | ICD-10-CM | POA: Diagnosis not present

## 2023-03-10 DIAGNOSIS — G8929 Other chronic pain: Secondary | ICD-10-CM | POA: Diagnosis not present

## 2023-03-10 DIAGNOSIS — Z955 Presence of coronary angioplasty implant and graft: Secondary | ICD-10-CM

## 2023-03-10 DIAGNOSIS — R0602 Shortness of breath: Secondary | ICD-10-CM | POA: Diagnosis not present

## 2023-03-10 DIAGNOSIS — R0789 Other chest pain: Secondary | ICD-10-CM | POA: Diagnosis not present

## 2023-03-10 DIAGNOSIS — I4711 Inappropriate sinus tachycardia, so stated: Secondary | ICD-10-CM | POA: Diagnosis not present

## 2023-03-10 DIAGNOSIS — Z0130 Encounter for examination of blood pressure without abnormal findings: Secondary | ICD-10-CM

## 2023-03-10 DIAGNOSIS — I10 Essential (primary) hypertension: Secondary | ICD-10-CM | POA: Diagnosis not present

## 2023-03-10 MED ORDER — METOPROLOL SUCCINATE ER 100 MG PO TB24: 100.0000 mg | ORAL_TABLET | Freq: Every day | ORAL | 11 refills | Status: AC

## 2023-03-10 MED ORDER — METOPROLOL SUCCINATE ER 200 MG PO TB24: 200.0000 mg | ORAL_TABLET | Freq: Every day | ORAL | 11 refills | Status: DC

## 2023-03-10 NOTE — Telephone Encounter (Signed)
 Amy Leighton Punches with [facility] called asking for clarification for patients metoprolol  xl, she's got a 100mg  and 200mg  dose on file. She was previously on a 50mg  dose please advise what dose the patient should be taking

## 2023-03-10 NOTE — Progress Notes (Addendum)
Cardiology Office Note   Date:  03/11/2023   ID:  Yvette Riley, DOB 12/04/1955, MRN [id number]  PCP:  Sherol Dade, DO  Cardiologist:  Adrian Blackwater, MD      History of Present Illness: Yvette Riley is a 68 y.o. female who presents for No chief complaint on file.   Chest Pain  This is a recurrent problem. The current episode started more than 1 month ago. The problem has been resolved. The pain is at a severity of 4/10. The quality of the pain is described as dull.      Past Medical History:  Diagnosis Date   Allergy    Anemia    Anxiety    Arthritis    Asthma    Blood transfusion without reported diagnosis    C. difficile diarrhea 04/07/2016   CAP (community acquired pneumonia) 03/21/2015   Combined forms of age-related cataract of both eyes 05/06/2016   COPD (chronic obstructive pulmonary disease) (HCC)    CVA (cerebral vascular accident) (HCC) 06/17/2018   Diabetes mellitus without complication (HCC)    Displacement of lumbar intervertebral disc without myelopathy    Emphysema of lung (HCC)    GERD (gastroesophageal reflux disease)    Glaucoma    History of chicken pox    Hyperlipidemia    Hypertension    NSTEMI (non-ST elevated myocardial infarction) (HCC) 01/07/2023   Pneumonia 03/29/2015   Sepsis (HCC) 03/17/2016   Shortness of breath    Small cell lung cancer (HCC)      Past Surgical History:  Procedure Laterality Date   BALLOON DILATION  11/20/2022   Procedure: BALLOON DILATION;  Surgeon: Norma Fredrickson, Boykin Nearing, MD;  Location: [facility] ENDOSCOPY;  Service: Gastroenterology;;   BIOPSY  11/20/2022   Procedure: BIOPSY;  Surgeon: Toledo, Boykin Nearing, MD;  Location: [facility] ENDOSCOPY;  Service: Gastroenterology;;   CESAREAN SECTION     CORONARY STENT INTERVENTION N/A 01/08/2023   Procedure: CORONARY STENT INTERVENTION;  Surgeon: Alwyn Pea, MD;  Location: [facility] INVASIVE CV LAB;  Service: Cardiovascular;  Laterality: N/A;   ESOPHAGOGASTRODUODENOSCOPY  N/A 05/17/2022   Procedure: ESOPHAGOGASTRODUODENOSCOPY (EGD);  Surgeon: [name], MD;  Location: [facility] ENDOSCOPY;  Service: Endoscopy;  Laterality: N/A;   ESOPHAGOGASTRODUODENOSCOPY N/A 07/10/2022   Procedure: ESOPHAGOGASTRODUODENOSCOPY (EGD);  Surgeon: Toledo, Boykin Nearing, MD;  Location: [facility] ENDOSCOPY;  Service: Gastroenterology;  Laterality: N/A;   ESOPHAGOGASTRODUODENOSCOPY (EGD) WITH PROPOFOL N/A 02/08/2022   Procedure: ESOPHAGOGASTRODUODENOSCOPY (EGD) WITH PROPOFOL;  Surgeon: Wyline Mood, MD;  Location: [facility] ENDOSCOPY;  Service: Gastroenterology;  Laterality: N/A;   ESOPHAGOGASTRODUODENOSCOPY (EGD) WITH PROPOFOL N/A 11/20/2022   Procedure: ESOPHAGOGASTRODUODENOSCOPY (EGD) WITH PROPOFOL;  Surgeon: Toledo, Boykin Nearing, MD;  Location: [facility] ENDOSCOPY;  Service: Gastroenterology;  Laterality: N/A;   ESOPHAGOGASTRODUODENOSCOPY (EGD) WITH PROPOFOL N/A 12/25/2022   Procedure: ESOPHAGOGASTRODUODENOSCOPY (EGD) WITH PROPOFOL;  Surgeon: Toney Reil, MD;  Location: [facility] ENDOSCOPY;  Service: Gastroenterology;  Laterality: N/A;   LEFT HEART CATH AND CORONARY ANGIOGRAPHY N/A 01/08/2023   Procedure: LEFT HEART CATH AND CORONARY ANGIOGRAPHY;  Surgeon: Laurier Nancy, MD;  Location: [facility] INVASIVE CV LAB;  Service: Cardiovascular;  Laterality: N/A;   TUBAL LIGATION     UPPER GI ENDOSCOPY       Current Outpatient Medications  Medication Sig Dispense Refill   metoprolol (TOPROL XL) 200 MG 24 hr tablet Take 1 tablet (200 mg total) by mouth daily. 30 tablet 11   metoprolol succinate (TOPROL XL) 100 MG 24 hr tablet Take 1 tablet (100 mg  total) by mouth daily. Take with or immediately following a meal. 30 tablet 11   albuterol (PROVENTIL) (2.5 MG/3ML) 0.083% nebulizer solution Take 2.5 mg by nebulization every 6 (six) hours.     aspirin EC 81 MG tablet Take 1 tablet (81 mg total) by mouth daily. Swallow whole. 30 tablet 12   busPIRone (BUSPAR) 5 MG tablet Take 5 mg by mouth 3 (three) times daily.      Cholecalciferol (VITAMIN D) 50 MCG (2000 UT) CAPS Take 1,000 Units by mouth daily.     clopidogrel (PLAVIX) 75 MG tablet Take 1 tablet (75 mg total) by mouth daily. 90 tablet 4   dapagliflozin propanediol (FARXIGA) 10 MG TABS tablet Take 1 tablet (10 mg total) by mouth daily before breakfast. 30 tablet 3   diclofenac Sodium (VOLTAREN) 1 % GEL Apply 4 g topically 4 (four) times daily. (Patient taking differently: Apply 2 g topically every 6 (six) hours as needed (pain). Apply to back of neck, not to exceed 16 grams per day.) 350 g 3   econazole nitrate 1 % cream Apply 1 Application topically 2 (two) times daily as needed.     ezetimibe (ZETIA) 10 MG tablet Take 10 mg by mouth daily.     Fluticasone-Umeclidin-Vilant (TRELEGY ELLIPTA) 200-62.5-25 MCG/ACT AEPB Inhale 1 puff into the lungs daily. 30 each 12   gabapentin (NEURONTIN) 300 MG capsule Take 300 mg by mouth 2 (two) times daily.     hydrocortisone cream 1 % Apply 1 Application topically every 6 (six) hours as needed for itching.     ketoconazole (NIZORAL) 2 % shampoo Apply 1 Application topically 2 (two) times a week. Apply to scalp twice a week during shower on Wednesday and Saturday. Lather and allow to sit for 5 minutes before rinsing.     levothyroxine (SYNTHROID) 50 MCG tablet Take 50 mcg by mouth daily before breakfast.     loperamide (IMODIUM) 2 MG capsule Take by mouth as needed for diarrhea or loose stools.     Magnesium 400 MG TABS Take 1 tablet by mouth 2 (two) times daily.     metFORMIN (GLUCOPHAGE) 500 MG tablet Take 500 mg by mouth 2 (two) times daily with a meal.     miconazole (MICOTIN) 2 % powder Apply 1 Application topically daily. Apply to affected areas on abdomen daily.     omeprazole (PRILOSEC) 40 MG capsule Take 1 capsule (40 mg total) by mouth 2 (two) times daily. Take in the morning and at dinnertime. 120 capsule 0   ondansetron (ZOFRAN) 4 MG tablet Take 4 mg by mouth every 6 (six) hours as needed for nausea.      ranolazine (RANEXA) 500 MG 12 hr tablet Take 1 tablet (500 mg total) by mouth 2 (two) times daily. 60 tablet 2   senna (SENOKOT) 8.6 MG TABS tablet Take 2 tablets by mouth daily.     sertraline (ZOLOFT) 50 MG tablet Take 1 tablet (50 mg total) by mouth daily. 90 tablet 4   vitamin B-12 (CYANOCOBALAMIN) 1000 MCG tablet Take 1 tablet (1,000 mcg total) by mouth daily. 30 tablet 0   white petrolatum ointment Apply 1 application  topically daily. After washing with soap and water, apply petrolatum to left lateral back (biopsy site) once a day and cover with Band-Aid.     zaleplon (SONATA) 5 MG capsule Take 5 mg by mouth at bedtime.     No current facility-administered medications for this visit.    Allergies:  Other, Penicillins, Yellow jacket venom [bee venom], Codeine, Crestor [rosuvastatin], Gemfibrozil, Trazodone and nefazodone, and Lipitor [atorvastatin]    Social History:   reports that she quit smoking about 6 years ago. Her smoking use included cigarettes. She started smoking about 52 years ago. She has a 11.3 pack-year smoking history. She has never used smokeless tobacco. She reports that she does not drink alcohol and does not use drugs.   Family History:  family history includes COPD in her brother; Cancer in her maternal aunt; Heart disease in her brother and mother; Heart failure in her mother; Stroke in her mother.    ROS:     Review of Systems  Constitutional: Negative.   HENT: Negative.    Eyes: Negative.   Respiratory: Negative.    Cardiovascular:  Positive for chest pain.  Gastrointestinal: Negative.   Genitourinary: Negative.   Musculoskeletal: Negative.   Skin: Negative.   Neurological: Negative.   Endo/Heme/Allergies: Negative.   Psychiatric/Behavioral: Negative.    All other systems reviewed and are negative.     All other systems are reviewed and negative.    PHYSICAL EXAM: VS:  BP 110/72   Pulse (!) 111   Ht 4\' 11"  (1.499 m)   Wt 182 lb (82.6 kg)    SpO2 94%   BMI 36.76 kg/m  , BMI Body mass index is 36.76 kg/m. Last weight:  Wt Readings from Last 3 Encounters:  03/10/23 182 lb (82.6 kg)  02/03/23 189 lb (85.7 kg)  01/20/23 187 lb (84.8 kg)     Physical Exam Constitutional:      Appearance: Normal appearance.  Cardiovascular:     Rate and Rhythm: Normal rate and regular rhythm.     Heart sounds: Normal heart sounds.  Pulmonary:     Effort: Pulmonary effort is normal.     Breath sounds: Normal breath sounds.  Musculoskeletal:     Right lower leg: No edema.     Left lower leg: No edema.  Neurological:     Mental Status: She is alert.       EKG:   Recent Labs: 01/08/2023: TSH 7.022 01/13/2023: ALT 15; BUN 21; Creatinine, Ser 0.92; Hemoglobin 10.4; Platelets 232; Potassium 3.8; Sodium 136    Lipid Panel    Component Value Date/Time   CHOL 167 01/08/2023 0328   CHOL 228 (H) 06/19/2020 1405   TRIG 561 (H) 01/08/2023 0328   HDL 31 (L) 01/08/2023 0328   HDL 35 (L) 06/19/2020 1405   CHOLHDL 5.4 01/08/2023 0328   VLDL UNABLE TO CALCULATE IF TRIGLYCERIDE OVER 400 mg/dL 16/10/9602 5409   LDLCALC UNABLE TO CALCULATE IF TRIGLYCERIDE OVER 400 mg/dL 81/19/1478 2956   LDLCALC 82 06/19/2020 1405   LDLDIRECT 81 01/08/2023 0328      Other studies Reviewed: Additional studies/ records that were reviewed today include:  Review of the above records demonstrates:       No data to display            ASSESSMENT AND PLAN:    ICD-10-CM   1. Inappropriate sinus tachycardia (HCC)  I47.11 metoprolol (TOPROL XL) 200 MG 24 hr tablet    metoprolol succinate (TOPROL XL) 100 MG 24 hr tablet   change metoprolol to 300 mg po daily as still tachycardic on 200 mg    2. CHF (congestive heart failure), NYHA class III, acute on chronic, diastolic (HCC)  I50.33 metoprolol (TOPROL XL) 200 MG 24 hr tablet    metoprolol succinate (TOPROL XL) 100 MG  24 hr tablet    3. SOB (shortness of breath)  R06.02 metoprolol (TOPROL XL) 200 MG 24  hr tablet    metoprolol succinate (TOPROL XL) 100 MG 24 hr tablet    4. Other chest pain  R07.89 metoprolol (TOPROL XL) 200 MG 24 hr tablet    metoprolol succinate (TOPROL XL) 100 MG 24 hr tablet   will see in 4 weeks to set up cath and PCI    5. Obstructive sleep apnea syndrome  G47.33 metoprolol (TOPROL XL) 200 MG 24 hr tablet    metoprolol succinate (TOPROL XL) 100 MG 24 hr tablet    6. History of placement of stent in LAD coronary artery  Z95.5 metoprolol (TOPROL XL) 200 MG 24 hr tablet    metoprolol succinate (TOPROL XL) 100 MG 24 hr tablet   12/25 had PCI/stent in mid LAD, but need staged PCI of LCX as it had also high grade lesion. Has infrequent chest pain. Will add metoprolol as tachycardic.       Problem List Items Addressed This Visit   None Visit Diagnoses       Inappropriate sinus tachycardia (HCC)    -  Primary   change metoprolol to 300 mg po daily as still tachycardic on 200 mg   Relevant Medications   metoprolol (TOPROL XL) 200 MG 24 hr tablet   metoprolol succinate (TOPROL XL) 100 MG 24 hr tablet     CHF (congestive heart failure), NYHA class III, acute on chronic, diastolic (HCC)       Relevant Medications   metoprolol (TOPROL XL) 200 MG 24 hr tablet   metoprolol succinate (TOPROL XL) 100 MG 24 hr tablet     SOB (shortness of breath)       Relevant Medications   metoprolol (TOPROL XL) 200 MG 24 hr tablet   metoprolol succinate (TOPROL XL) 100 MG 24 hr tablet     Other chest pain       will see in 4 weeks to set up cath and PCI   Relevant Medications   metoprolol (TOPROL XL) 200 MG 24 hr tablet   metoprolol succinate (TOPROL XL) 100 MG 24 hr tablet     Obstructive sleep apnea syndrome       Relevant Medications   metoprolol (TOPROL XL) 200 MG 24 hr tablet   metoprolol succinate (TOPROL XL) 100 MG 24 hr tablet     History of placement of stent in LAD coronary artery       12/25 had PCI/stent in mid LAD, but need staged PCI of LCX as it had also high grade  lesion. Has infrequent chest pain. Will add metoprolol as tachycardic.   Relevant Medications   metoprolol (TOPROL XL) 200 MG 24 hr tablet   metoprolol succinate (TOPROL XL) 100 MG 24 hr tablet          Disposition:   Return in about 4 weeks (around 04/07/2023).    Total time spent: 40 minutes  Signed,  Adrian Blackwater, MD  03/11/2023 11:43 AM    [facility]

## 2023-03-11 ENCOUNTER — Other Ambulatory Visit: Payer: Self-pay

## 2023-03-11 NOTE — Telephone Encounter (Signed)
Amy informed.

## 2023-03-12 DIAGNOSIS — I2510 Atherosclerotic heart disease of native coronary artery without angina pectoris: Secondary | ICD-10-CM | POA: Diagnosis not present

## 2023-03-12 DIAGNOSIS — I69351 Hemiplegia and hemiparesis following cerebral infarction affecting right dominant side: Secondary | ICD-10-CM | POA: Diagnosis not present

## 2023-03-12 DIAGNOSIS — G629 Polyneuropathy, unspecified: Secondary | ICD-10-CM | POA: Diagnosis not present

## 2023-03-12 DIAGNOSIS — I10 Essential (primary) hypertension: Secondary | ICD-10-CM | POA: Diagnosis not present

## 2023-03-12 DIAGNOSIS — E039 Hypothyroidism, unspecified: Secondary | ICD-10-CM | POA: Diagnosis not present

## 2023-03-12 DIAGNOSIS — E785 Hyperlipidemia, unspecified: Secondary | ICD-10-CM | POA: Diagnosis not present

## 2023-03-12 DIAGNOSIS — J449 Chronic obstructive pulmonary disease, unspecified: Secondary | ICD-10-CM | POA: Diagnosis not present

## 2023-03-12 DIAGNOSIS — I6930 Unspecified sequelae of cerebral infarction: Secondary | ICD-10-CM | POA: Diagnosis not present

## 2023-03-13 DIAGNOSIS — K8020 Calculus of gallbladder without cholecystitis without obstruction: Secondary | ICD-10-CM | POA: Diagnosis not present

## 2023-03-13 DIAGNOSIS — K760 Fatty (change of) liver, not elsewhere classified: Secondary | ICD-10-CM | POA: Diagnosis not present

## 2023-03-17 DIAGNOSIS — K8020 Calculus of gallbladder without cholecystitis without obstruction: Secondary | ICD-10-CM | POA: Diagnosis not present

## 2023-03-17 DIAGNOSIS — M6281 Muscle weakness (generalized): Secondary | ICD-10-CM | POA: Diagnosis not present

## 2023-03-17 DIAGNOSIS — R160 Hepatomegaly, not elsewhere classified: Secondary | ICD-10-CM | POA: Diagnosis not present

## 2023-03-18 DIAGNOSIS — M6281 Muscle weakness (generalized): Secondary | ICD-10-CM | POA: Diagnosis not present

## 2023-03-20 DIAGNOSIS — M6281 Muscle weakness (generalized): Secondary | ICD-10-CM | POA: Diagnosis not present

## 2023-03-21 DIAGNOSIS — M6281 Muscle weakness (generalized): Secondary | ICD-10-CM | POA: Diagnosis not present

## 2023-03-24 DIAGNOSIS — M6281 Muscle weakness (generalized): Secondary | ICD-10-CM | POA: Diagnosis not present

## 2023-03-26 DIAGNOSIS — M6281 Muscle weakness (generalized): Secondary | ICD-10-CM | POA: Diagnosis not present

## 2023-03-27 DIAGNOSIS — M6281 Muscle weakness (generalized): Secondary | ICD-10-CM | POA: Diagnosis not present

## 2023-03-28 DIAGNOSIS — M6281 Muscle weakness (generalized): Secondary | ICD-10-CM | POA: Diagnosis not present

## 2023-03-31 DIAGNOSIS — Z8614 Personal history of Methicillin resistant Staphylococcus aureus infection: Secondary | ICD-10-CM | POA: Diagnosis not present

## 2023-03-31 DIAGNOSIS — L0291 Cutaneous abscess, unspecified: Secondary | ICD-10-CM | POA: Diagnosis not present

## 2023-04-01 DIAGNOSIS — M6281 Muscle weakness (generalized): Secondary | ICD-10-CM | POA: Diagnosis not present

## 2023-04-02 DIAGNOSIS — M6281 Muscle weakness (generalized): Secondary | ICD-10-CM | POA: Diagnosis not present

## 2023-04-03 DIAGNOSIS — M6281 Muscle weakness (generalized): Secondary | ICD-10-CM | POA: Diagnosis not present

## 2023-04-04 DIAGNOSIS — M6281 Muscle weakness (generalized): Secondary | ICD-10-CM | POA: Diagnosis not present

## 2023-04-07 ENCOUNTER — Ambulatory Visit (INDEPENDENT_AMBULATORY_CARE_PROVIDER_SITE_OTHER): Payer: 59 | Admitting: Cardiovascular Disease

## 2023-04-07 ENCOUNTER — Encounter: Payer: Self-pay | Admitting: Cardiovascular Disease

## 2023-04-07 VITALS — BP 108/62 | HR 90 | Ht <= 58 in | Wt 187.0 lb

## 2023-04-07 DIAGNOSIS — R0602 Shortness of breath: Secondary | ICD-10-CM

## 2023-04-07 DIAGNOSIS — R0789 Other chest pain: Secondary | ICD-10-CM | POA: Diagnosis not present

## 2023-04-07 DIAGNOSIS — G4733 Obstructive sleep apnea (adult) (pediatric): Secondary | ICD-10-CM

## 2023-04-07 DIAGNOSIS — I4711 Inappropriate sinus tachycardia, so stated: Secondary | ICD-10-CM | POA: Diagnosis not present

## 2023-04-07 DIAGNOSIS — I5033 Acute on chronic diastolic (congestive) heart failure: Secondary | ICD-10-CM | POA: Diagnosis not present

## 2023-04-07 DIAGNOSIS — Z0130 Encounter for examination of blood pressure without abnormal findings: Secondary | ICD-10-CM

## 2023-04-07 NOTE — Progress Notes (Addendum)
 Cardiology Office Note   Date:  04/07/2023   ID:  Yvette Riley, Yvette Riley 03-21-1955, MRN [id number]  PCP:  Sherol Dade, DO  Cardiologist:  Adrian Blackwater, MD      History of Present Illness: Yvette Riley is a 68 y.o. female who presents for  Chief Complaint  Patient presents with  . Follow-up    4 week follow up     Palpitations  This is a recurrent problem. The current episode started more than 1 month ago. The problem has been gradually improving.      Past Medical History:  Diagnosis Date  . Allergy   . Anemia   . Anxiety   . Arthritis   . Asthma   . Blood transfusion without reported diagnosis   . C. difficile diarrhea 04/07/2016  . CAP (community acquired pneumonia) 03/21/2015  . Combined forms of age-related cataract of both eyes 05/06/2016  . COPD (chronic obstructive pulmonary disease) (HCC)   . CVA (cerebral vascular accident) (HCC) 06/17/2018  . Diabetes mellitus without complication (HCC)   . Displacement of lumbar intervertebral disc without myelopathy   . Emphysema of lung (HCC)   . GERD (gastroesophageal reflux disease)   . Glaucoma   . History of chicken pox   . Hyperlipidemia   . Hypertension   . NSTEMI (non-ST elevated myocardial infarction) (HCC) 01/07/2023  . Pneumonia 03/29/2015  . Sepsis (HCC) 03/17/2016  . Shortness of breath   . Small cell lung cancer [facility])      Past Surgical History:  Procedure Laterality Date  . BALLOON DILATION  11/20/2022   Procedure: BALLOON DILATION;  Surgeon: Norma Fredrickson, Boykin Nearing, MD;  Location: [facility] ENDOSCOPY;  Service: Gastroenterology;;  . BIOPSY  11/20/2022   Procedure: BIOPSY;  Surgeon: Toledo, Boykin Nearing, MD;  Location: [facility] ENDOSCOPY;  Service: Gastroenterology;;  . CESAREAN SECTION    . CORONARY STENT INTERVENTION N/A 01/08/2023   Procedure: CORONARY STENT INTERVENTION;  Surgeon: Alwyn Pea, MD;  Location: [facility] INVASIVE CV LAB;  Service: Cardiovascular;  Laterality: N/A;  .  ESOPHAGOGASTRODUODENOSCOPY N/A 05/17/2022   Procedure: ESOPHAGOGASTRODUODENOSCOPY (EGD);  Surgeon: [name], MD;  Location: [facility] ENDOSCOPY;  Service: Endoscopy;  Laterality: N/A;  . ESOPHAGOGASTRODUODENOSCOPY N/A 07/10/2022   Procedure: ESOPHAGOGASTRODUODENOSCOPY (EGD);  Surgeon: Toledo, Boykin Nearing, MD;  Location: [facility] ENDOSCOPY;  Service: Gastroenterology;  Laterality: N/A;  . ESOPHAGOGASTRODUODENOSCOPY (EGD) WITH PROPOFOL N/A 02/08/2022   Procedure: ESOPHAGOGASTRODUODENOSCOPY (EGD) WITH PROPOFOL;  Surgeon: Wyline Mood, MD;  Location: [facility] ENDOSCOPY;  Service: Gastroenterology;  Laterality: N/A;  . ESOPHAGOGASTRODUODENOSCOPY (EGD) WITH PROPOFOL N/A 11/20/2022   Procedure: ESOPHAGOGASTRODUODENOSCOPY (EGD) WITH PROPOFOL;  Surgeon: Toledo, Boykin Nearing, MD;  Location: [facility] ENDOSCOPY;  Service: Gastroenterology;  Laterality: N/A;  . ESOPHAGOGASTRODUODENOSCOPY (EGD) WITH PROPOFOL N/A 12/25/2022   Procedure: ESOPHAGOGASTRODUODENOSCOPY (EGD) WITH PROPOFOL;  Surgeon: Toney Reil, MD;  Location: [facility] ENDOSCOPY;  Service: Gastroenterology;  Laterality: N/A;  . LEFT HEART CATH AND CORONARY ANGIOGRAPHY N/A 01/08/2023   Procedure: LEFT HEART CATH AND CORONARY ANGIOGRAPHY;  Surgeon: Laurier Nancy, MD;  Location: [facility] INVASIVE CV LAB;  Service: Cardiovascular;  Laterality: N/A;  . TUBAL LIGATION    . UPPER GI ENDOSCOPY       Current Outpatient Medications  Medication Sig Dispense Refill  . albuterol (PROVENTIL) (2.5 MG/3ML) 0.083% nebulizer solution Take 2.5 mg by nebulization every 6 (six) hours.    [name] aspirin EC 81 MG tablet Take 1 tablet (81 mg total) by mouth daily. Swallow whole. 30 tablet 12  .  busPIRone (BUSPAR) 5 MG tablet Take 5 mg by mouth 3 (three) times daily.    . Cholecalciferol (VITAMIN D) 50 MCG (2000 UT) CAPS Take 1,000 Units by mouth daily.    . clopidogrel (PLAVIX) 75 MG tablet Take 1 tablet (75 mg total) by mouth daily. 90 tablet 4  . dapagliflozin propanediol (FARXIGA) 10 MG TABS  tablet Take 1 tablet (10 mg total) by mouth daily before breakfast. 30 tablet 3  . diclofenac Sodium (VOLTAREN) 1 % GEL Apply 4 g topically 4 (four) times daily. (Patient taking differently: Apply 2 g topically every 6 (six) hours as needed (pain). Apply to back of neck, not to exceed 16 grams per day.) 350 g 3  . econazole nitrate 1 % cream Apply 1 Application topically 2 (two) times daily as needed.    . ezetimibe (ZETIA) 10 MG tablet Take 10 mg by mouth daily.    . Fluticasone-Umeclidin-Vilant (TRELEGY ELLIPTA) 200-62.5-25 MCG/ACT AEPB Inhale 1 puff into the lungs daily. 30 each 12  . gabapentin (NEURONTIN) 300 MG capsule Take 300 mg by mouth 2 (two) times daily.    . hydrocortisone cream 1 % Apply 1 Application topically every 6 (six) hours as needed for itching.    [name] ketoconazole (NIZORAL) 2 % shampoo Apply 1 Application topically 2 (two) times a week. Apply to scalp twice a week during shower on Wednesday and Saturday. Lather and allow to sit for 5 minutes before rinsing.    [name] levothyroxine (SYNTHROID) 50 MCG tablet Take 50 mcg by mouth daily before breakfast.    . loperamide (IMODIUM) 2 MG capsule Take by mouth as needed for diarrhea or loose stools.    . Magnesium 400 MG TABS Take 1 tablet by mouth 2 (two) times daily.    . metFORMIN (GLUCOPHAGE) 500 MG tablet Take 500 mg by mouth 2 (two) times daily with a meal.    . metoprolol succinate (TOPROL XL) 100 MG 24 hr tablet Take 1 tablet (100 mg total) by mouth daily. Take with or immediately following a meal. 30 tablet 11  . miconazole (MICOTIN) 2 % powder Apply 1 Application topically daily. Apply to affected areas on abdomen daily.    [name] omeprazole (PRILOSEC) 40 MG capsule Take 1 capsule (40 mg total) by mouth 2 (two) times daily. Take in the morning and at dinnertime. 120 capsule 0  . ondansetron (ZOFRAN) 4 MG tablet Take 4 mg by mouth every 6 (six) hours as needed for nausea.    . ranolazine (RANEXA) 500 MG 12 hr tablet Take 1 tablet (500 mg  total) by mouth 2 (two) times daily. 60 tablet 2  . senna (SENOKOT) 8.6 MG TABS tablet Take 2 tablets by mouth daily.    . sertraline (ZOLOFT) 50 MG tablet Take 1 tablet (50 mg total) by mouth daily. 90 tablet 4  . vitamin B-12 (CYANOCOBALAMIN) 1000 MCG tablet Take 1 tablet (1,000 mcg total) by mouth daily. 30 tablet 0  . white petrolatum ointment Apply 1 application  topically daily. After washing with soap and water, apply petrolatum to left lateral back (biopsy site) once a day and cover with Band-Aid.    [name] zaleplon (SONATA) 5 MG capsule Take 5 mg by mouth at bedtime.     No current facility-administered medications for this visit.    Allergies:   Other, Penicillins, Yellow jacket venom [bee venom], Codeine, Crestor [rosuvastatin], Gemfibrozil, Trazodone and nefazodone, and Lipitor [atorvastatin]    Social History:   reports that she  quit smoking about 7 years ago. Her smoking use included cigarettes. She started smoking about 52 years ago. She has a 11.3 pack-year smoking history. She has never used smokeless tobacco. She reports that she does not drink alcohol and does not use drugs.   Family History:  family history includes COPD in her brother; Cancer in her maternal aunt; Heart disease in her brother and mother; Heart failure in her mother; Stroke in her mother.    ROS:     Review of Systems  Constitutional: Negative.   HENT: Negative.    Eyes: Negative.   Respiratory: Negative.    Cardiovascular:  Positive for palpitations.  Gastrointestinal: Negative.   Genitourinary: Negative.   Musculoskeletal: Negative.   Skin: Negative.   Neurological: Negative.   Endo/Heme/Allergies: Negative.   Psychiatric/Behavioral: Negative.    All other systems reviewed and are negative.     All other systems are reviewed and negative.    PHYSICAL EXAM: VS:  BP 108/62   Pulse 90   Ht 4\' 9"  (1.448 m)   Wt 187 lb (84.8 kg)   SpO2 92%   BMI 40.47 kg/m  , BMI Body mass index is 40.47  kg/m. Last weight:  Wt Readings from Last 3 Encounters:  04/07/23 187 lb (84.8 kg)  03/10/23 182 lb (82.6 kg)  02/03/23 189 lb (85.7 kg)     Physical Exam Constitutional:      Appearance: Normal appearance.  Cardiovascular:     Rate and Rhythm: Normal rate and regular rhythm.     Heart sounds: Normal heart sounds.  Pulmonary:     Effort: Pulmonary effort is normal.     Breath sounds: Normal breath sounds.  Musculoskeletal:     Right lower leg: No edema.     Left lower leg: No edema.  Neurological:     Mental Status: She is alert.      EKG:   Recent Labs: 01/08/2023: TSH 7.022 01/13/2023: ALT 15; BUN 21; Creatinine, Ser 0.92; Hemoglobin 10.4; Platelets 232; Potassium 3.8; Sodium 136    Lipid Panel    Component Value Date/Time   CHOL 167 01/08/2023 0328   CHOL 228 (H) 06/19/2020 1405   TRIG 561 (H) 01/08/2023 0328   HDL 31 (L) 01/08/2023 0328   HDL 35 (L) 06/19/2020 1405   CHOLHDL 5.4 01/08/2023 0328   VLDL UNABLE TO CALCULATE IF TRIGLYCERIDE OVER 400 mg/dL 72/53/6644 0347   LDLCALC UNABLE TO CALCULATE IF TRIGLYCERIDE OVER 400 mg/dL 42/59/5638 7564   LDLCALC 82 06/19/2020 1405   LDLDIRECT 81 01/08/2023 0328      Other studies Reviewed: Additional studies/ records that were reviewed today include:  Review of the above records demonstrates:       No data to display            ASSESSMENT AND PLAN:    ICD-10-CM   1. Inappropriate sinus tachycardia (HCC)  I47.11    occasional palpitation, take extra dose metoprolol if needed    2. CHF (congestive heart failure), NYHA class III, acute on chronic, diastolic (HCC)  I50.33     3. SOB (shortness of breath)  R06.02     4. Other chest pain  R07.89     5. Obstructive sleep apnea syndrome  G47.33        Problem List Items Addressed This Visit   None Visit Diagnoses       Inappropriate sinus tachycardia (HCC)    -  Primary   occasional palpitation, take  extra dose metoprolol if needed     CHF  (congestive heart failure), NYHA class III, acute on chronic, diastolic (HCC)         SOB (shortness of breath)         Other chest pain         Obstructive sleep apnea syndrome              Disposition:   Return in about 3 months (around 07/08/2023).    Total time spent: 30 minutes  Signed,  Adrian Blackwater, MD  04/07/2023 1:21 PM    [facility]

## 2023-04-08 DIAGNOSIS — M6281 Muscle weakness (generalized): Secondary | ICD-10-CM | POA: Diagnosis not present

## 2023-04-09 DIAGNOSIS — G8929 Other chronic pain: Secondary | ICD-10-CM | POA: Diagnosis not present

## 2023-04-09 DIAGNOSIS — M6281 Muscle weakness (generalized): Secondary | ICD-10-CM | POA: Diagnosis not present

## 2023-04-10 ENCOUNTER — Ambulatory Visit: Payer: 59 | Admitting: Student in an Organized Health Care Education/Training Program

## 2023-04-10 VITALS — BP 124/72 | HR 103 | Temp 98.8°F | Ht <= 58 in | Wt 187.8 lb

## 2023-04-10 DIAGNOSIS — J42 Unspecified chronic bronchitis: Secondary | ICD-10-CM | POA: Diagnosis not present

## 2023-04-10 DIAGNOSIS — J4540 Moderate persistent asthma, uncomplicated: Secondary | ICD-10-CM | POA: Diagnosis not present

## 2023-04-10 DIAGNOSIS — G4733 Obstructive sleep apnea (adult) (pediatric): Secondary | ICD-10-CM | POA: Diagnosis not present

## 2023-04-10 DIAGNOSIS — M6281 Muscle weakness (generalized): Secondary | ICD-10-CM | POA: Diagnosis not present

## 2023-04-11 DIAGNOSIS — M6281 Muscle weakness (generalized): Secondary | ICD-10-CM | POA: Diagnosis not present

## 2023-04-12 NOTE — Progress Notes (Signed)
 Assessment & Plan:   1. Moderate persistent asthma without complication (Primary) 2. Chronic bronchitis, unspecified chronic bronchitis type (HCC) 3. OSA (obstructive sleep apnea)  Presenting for follow up of cough/wheeze in the setting of history of small cell lung cancer treated with chemoradiation as well as history of COPD and asthma. PFT's show findings consistent with PRiSM (preserved ratio, impaired spirometry) with no reversibility of obstruction following albuterol.  I suspect that the patient has COPD/asthma overlap syndrome compounded by the effects of her malignancy as well as radiation changes. With initiation of Trelegy Ellipta, she reports feeling much better with improvement in symptoms overall. Her max eosinophil count was 400 in the past and I suspect she would benefit from being continued on inhaled corticosteroids.  She is also maintained on montelukast.   Finally, given her concern for sleep apnea, body habitus, and snoring, sleep study was obtained and was positive. We initiated CPAP but she did not tolerate it well. Discussed at length with patient, and offered referral to sleep specialist which she is interested in pursuing.   -refer to sleep medicine -continue Trelegy one puff once daily  Return in about 1 year (around 04/09/2024).  I spent 32 minutes caring for this patient today, including preparing to see the patient, obtaining a medical history , reviewing a separately obtained history, performing a medically appropriate examination and/or evaluation, counseling and educating the patient/family/caregiver, referring and communicating with other health care professionals (not separately reported), and documenting clinical information in the electronic health record  Raechel Chute, MD [facility]   End of visit medications:  No orders of the defined types were placed in this encounter.    Current Outpatient Medications:    albuterol  (PROVENTIL) (2.5 MG/3ML) 0.083% nebulizer solution, Take 2.5 mg by nebulization every 6 (six) hours., Disp: , Rfl:    aspirin EC 81 MG tablet, Take 1 tablet (81 mg total) by mouth daily. Swallow whole., Disp: 30 tablet, Rfl: 12   busPIRone (BUSPAR) 5 MG tablet, Take 5 mg by mouth 3 (three) times daily., Disp: , Rfl:    Cholecalciferol (VITAMIN D) 50 MCG (2000 UT) CAPS, Take 1,000 Units by mouth daily., Disp: , Rfl:    clopidogrel (PLAVIX) 75 MG tablet, Take 1 tablet (75 mg total) by mouth daily., Disp: 90 tablet, Rfl: 4   dapagliflozin propanediol (FARXIGA) 10 MG TABS tablet, Take 1 tablet (10 mg total) by mouth daily before breakfast., Disp: 30 tablet, Rfl: 3   diclofenac Sodium (VOLTAREN) 1 % GEL, Apply 4 g topically 4 (four) times daily. (Patient taking differently: Apply 2 g topically every 6 (six) hours as needed (pain). Apply to back of neck, not to exceed 16 grams per day.), Disp: 350 g, Rfl: 3   econazole nitrate 1 % cream, Apply 1 Application topically 2 (two) times daily as needed., Disp: , Rfl:    ezetimibe (ZETIA) 10 MG tablet, Take 10 mg by mouth daily., Disp: , Rfl:    Fluticasone-Umeclidin-Vilant (TRELEGY ELLIPTA) 200-62.5-25 MCG/ACT AEPB, Inhale 1 puff into the lungs daily., Disp: 30 each, Rfl: 12   gabapentin (NEURONTIN) 300 MG capsule, Take 300 mg by mouth 2 (two) times daily., Disp: , Rfl:    hydrocortisone cream 1 %, Apply 1 Application topically every 6 (six) hours as needed for itching., Disp: , Rfl:    ketoconazole (NIZORAL) 2 % shampoo, Apply 1 Application topically 2 (two) times a week. Apply to scalp twice a week during shower on Wednesday and  Saturday. Lather and allow to sit for 5 minutes before rinsing., Disp: , Rfl:    levothyroxine (SYNTHROID) 50 MCG tablet, Take 50 mcg by mouth daily before breakfast., Disp: , Rfl:    loperamide (IMODIUM) 2 MG capsule, Take by mouth as needed for diarrhea or loose stools., Disp: , Rfl:    Magnesium 400 MG TABS, Take 1 tablet by mouth 2  (two) times daily., Disp: , Rfl:    metFORMIN (GLUCOPHAGE) 500 MG tablet, Take 500 mg by mouth 2 (two) times daily with a meal., Disp: , Rfl:    metoprolol succinate (TOPROL XL) 100 MG 24 hr tablet, Take 1 tablet (100 mg total) by mouth daily. Take with or immediately following a meal., Disp: 30 tablet, Rfl: 11   miconazole (MICOTIN) 2 % powder, Apply 1 Application topically daily. Apply to affected areas on abdomen daily., Disp: , Rfl:    omeprazole (PRILOSEC) 40 MG capsule, Take 1 capsule (40 mg total) by mouth 2 (two) times daily. Take in the morning and at dinnertime., Disp: 120 capsule, Rfl: 0   ondansetron (ZOFRAN) 4 MG tablet, Take 4 mg by mouth every 6 (six) hours as needed for nausea., Disp: , Rfl:    ranolazine (RANEXA) 500 MG 12 hr tablet, Take 1 tablet (500 mg total) by mouth 2 (two) times daily., Disp: 60 tablet, Rfl: 2   senna (SENOKOT) 8.6 MG TABS tablet, Take 2 tablets by mouth daily., Disp: , Rfl:    sertraline (ZOLOFT) 50 MG tablet, Take 1 tablet (50 mg total) by mouth daily., Disp: 90 tablet, Rfl: 4   vitamin B-12 (CYANOCOBALAMIN) 1000 MCG tablet, Take 1 tablet (1,000 mcg total) by mouth daily., Disp: 30 tablet, Rfl: 0   white petrolatum ointment, Apply 1 application  topically daily. After washing with soap and water, apply petrolatum to left lateral back (biopsy site) once a day and cover with Band-Aid., Disp: , Rfl:    zaleplon (SONATA) 5 MG capsule, Take 5 mg by mouth at bedtime., Disp: , Rfl:    Subjective:   PATIENT ID: Nonah Mattes GENDER: female DOB: 1956/01/01, MRN: [id number]  Chief Complaint  Patient presents with   Follow-up    SOB with exertion, prod cough with clear sputum and wheezing. Could not tolerate cpap    HPI  Patient is a 68 year old female presenting to clinic for follow up.  She feels well overall, but continues to have some residual symptoms of cough and wheeze. No fevers or chills reported, and no worsening of her respiratory status. We had  added ICS to her regimen and switched inhalers to Trelegy during a prior visit with notable improvement in symptoms. She reports that she received a CPAP machine but was unable to tolerate using it, so sent it back.  She's not had any exacerbations since our last visit. She did get hospitalized in December of 2024 following a fall and was noted to have NSTEMI s/p LHC with angioplasty.   She has a history of small cell lung cancer previously treated with chemoradiation, now in remission, followed by Dr. Orlie Dakin from oncology.  She was referred to [place] for the evaluation of cough and shortness of breath.  She reports chronic symptoms that have been persistent, she was previously told she has asthma as well as COPD.  She tells me that she was diagnosed with asthma as a child and and had been on many inhalers over the years.  She started smoking at a very young age (59)  and quit in 2013.    Patient currently resides at a nursing facility. She is a non-smoker. She does not have any current exposures.  Ancillary information including prior medications, full medical/surgical/family/social histories, and PFTs (when available) are listed below and have been reviewed.   Review of Systems  Constitutional:  Negative for chills, fever and weight loss.  Respiratory:  Positive for cough, shortness of breath and wheezing.   Cardiovascular:  Negative for chest pain.     Objective:   Vitals:   04/10/23 0951  BP: 124/72  Pulse: (!) 103  Temp: 98.8 F (37.1 C)  TempSrc: Temporal  SpO2: 95%  Weight: 187 lb 12.8 oz (85.2 kg)  Height: 4\' 9"  (1.448 m)   95% on RA  BMI Readings from Last 3 Encounters:  04/10/23 40.64 kg/m  04/07/23 40.47 kg/m  03/10/23 36.76 kg/m   Wt Readings from Last 3 Encounters:  04/10/23 187 lb 12.8 oz (85.2 kg)  04/07/23 187 lb (84.8 kg)  03/10/23 182 lb (82.6 kg)    Physical Exam Constitutional:      General: She is not in acute distress.    Appearance: Normal  appearance. She is not ill-appearing.  Cardiovascular:     Rate and Rhythm: Normal rate and regular rhythm.     Pulses: Normal pulses.     Heart sounds: Normal heart sounds.  Pulmonary:     Effort: Pulmonary effort is normal. No respiratory distress.     Breath sounds: No wheezing or rhonchi.  Abdominal:     Palpations: Abdomen is soft.  Neurological:     Mental Status: She is alert. Mental status is at baseline.       Ancillary Information    Past Medical History:  Diagnosis Date   Allergy    Anemia    Anxiety    Arthritis    Asthma    Blood transfusion without reported diagnosis    C. difficile diarrhea 04/07/2016   CAP (community acquired pneumonia) 03/21/2015   Combined forms of age-related cataract of both eyes 05/06/2016   COPD (chronic obstructive pulmonary disease) (HCC)    CVA (cerebral vascular accident) (HCC) 06/17/2018   Diabetes mellitus without complication (HCC)    Displacement of lumbar intervertebral disc without myelopathy    Emphysema of lung (HCC)    GERD (gastroesophageal reflux disease)    Glaucoma    History of chicken pox    Hyperlipidemia    Hypertension    NSTEMI (non-ST elevated myocardial infarction) (HCC) 01/07/2023   Pneumonia 03/29/2015   Sepsis (HCC) 03/17/2016   Shortness of breath    Small cell lung cancer (HCC)      Family History  Problem Relation Age of Onset   Heart failure Mother    Heart disease Mother    Stroke Mother    Heart disease Brother    COPD Brother    Cancer Maternal Aunt        Breast Cancer     Past Surgical History:  Procedure Laterality Date   BALLOON DILATION  11/20/2022   Procedure: BALLOON DILATION;  Surgeon: Norma Fredrickson, Boykin Nearing, MD;  Location: [facility] ENDOSCOPY;  Service: Gastroenterology;;   BIOPSY  11/20/2022   Procedure: BIOPSY;  Surgeon: Toledo, Boykin Nearing, MD;  Location: [facility] ENDOSCOPY;  Service: Gastroenterology;;   CESAREAN SECTION     CORONARY STENT INTERVENTION N/A 01/08/2023   Procedure: CORONARY  STENT INTERVENTION;  Surgeon: Alwyn Pea, MD;  Location: [facility] INVASIVE CV LAB;  Service: Cardiovascular;  Laterality: N/A;   ESOPHAGOGASTRODUODENOSCOPY N/A 05/17/2022   Procedure: ESOPHAGOGASTRODUODENOSCOPY (EGD);  Surgeon: [name], MD;  Location: [facility] ENDOSCOPY;  Service: Endoscopy;  Laterality: N/A;   ESOPHAGOGASTRODUODENOSCOPY N/A 07/10/2022   Procedure: ESOPHAGOGASTRODUODENOSCOPY (EGD);  Surgeon: Toledo, Boykin Nearing, MD;  Location: [facility] ENDOSCOPY;  Service: Gastroenterology;  Laterality: N/A;   ESOPHAGOGASTRODUODENOSCOPY (EGD) WITH PROPOFOL N/A 02/08/2022   Procedure: ESOPHAGOGASTRODUODENOSCOPY (EGD) WITH PROPOFOL;  Surgeon: Wyline Mood, MD;  Location[facility] ENDOSCOPY;  Service: Gastroenterology;  Laterality: N/A;   ESOPHAGOGASTRODUODENOSCOPY (EGD) WITH PROPOFOL N/A 11/20/2022   Procedure: ESOPHAGOGASTRODUODENOSCOPY (EGD) WITH PROPOFOL;  Surgeon: Toledo, Boykin Nearing, MD;  Location: [facility] ENDOSCOPY;  Service: Gastroenterology;  Laterality: N/A;   ESOPHAGOGASTRODUODENOSCOPY (EGD) WITH PROPOFOL N/A 12/25/2022   Procedure: ESOPHAGOGASTRODUODENOSCOPY (EGD) WITH PROPOFOL;  Surgeon: Toney Reil, MD;  Location: [facility] ENDOSCOPY;  Service: Gastroenterology;  Laterality: N/A;   LEFT HEART CATH AND CORONARY ANGIOGRAPHY N/A 01/08/2023   Procedure: LEFT HEART CATH AND CORONARY ANGIOGRAPHY;  Surgeon: Laurier Nancy, MD;  Location: [facility] INVASIVE CV LAB;  Service: Cardiovascular;  Laterality: N/A;   TUBAL LIGATION     UPPER GI ENDOSCOPY      Social History   Socioeconomic History   Marital status: Widowed    Spouse name: Not on file   Number of children: 2   Years of education: Not on file   Highest education level: 10th grade  Occupational History   Occupation: disable  Tobacco Use   Smoking status: Former    Current packs/day: 0.00    Average packs/day: 0.3 packs/day for 45.0 years (11.3 ttl pk-yrs)    Types: Cigarettes    Start date: 03/18/1971    Quit date: 03/17/2016    Years  since quitting: 7.0   Smokeless tobacco: Never   Tobacco comments:    pt states she quit in 2016-2017  Vaping Use   Vaping status: Never Used  Substance and Sexual Activity   Alcohol use: No    Alcohol/week: 0.0 standard drinks of alcohol   Drug use: No   Sexual activity: Not Currently    Birth control/protection: Abstinence  Other Topics Concern   Not on file  Social History Narrative   Not on file   Social Drivers of Health   Financial Resource Strain: Low Risk  (09/05/2021)   Overall Financial Resource Strain (CARDIA)    Difficulty of Paying Living Expenses: Not hard at all  Food Insecurity: No Food Insecurity (01/08/2023)   Hunger Vital Sign    Worried About Running Out of Food in the Last Year: Never true    Ran Out of Food in the Last Year: Never true  Transportation Needs: No Transportation Needs (01/08/2023)   PRAPARE - Administrator, Civil Service (Medical): No    Lack of Transportation (Non-Medical): No  Physical Activity: Inactive (09/05/2021)   Exercise Vital Sign    Days of Exercise per Week: 0 days    Minutes of Exercise per Session: 0 min  Stress: Stress Concern Present (09/05/2021)   [employer] of Occupational Health - Occupational Stress Questionnaire    Feeling of Stress : Very much  Social Connections: Unknown (06/01/2021)   Received from [facility]   Social Network    Social Network: Not on file  Intimate Partner Violence: Not At Risk (01/08/2023)   Humiliation, Afraid, Rape, and Kick questionnaire    Fear of Current or Ex-Partner: No    Emotionally Abused: No    Physically Abused: No  Sexually Abused: No     Allergies  Allergen Reactions   Other Anaphylaxis   Penicillins Anaphylaxis, Rash and Other (See Comments)    Reaction:  Tongue swelling  Has patient had a PCN reaction causing immediate rash, facial/tongue/throat swelling, SOB or lightheadedness with hypotension:  Yes   Has patient had a PCN reaction  causing severe rash involving mucus membranes or skin necrosis: No Has patient had a PCN reaction that required hospitalization No Has patient had a PCN reaction occurring within the last 10 years: No If all of the above answers are "NO", then may proceed with Cephalosporin use.   Yellow Jacket Venom [Bee Venom] Anaphylaxis   Codeine Hives   Crestor [Rosuvastatin] Nausea And Vomiting    Corrected prior adverse reaction per [facility] [employer]. On 12/02/3011  patient reported N & V when she takes Crestor   Gemfibrozil Rash, Nausea And Vomiting and Swelling   Trazodone And Nefazodone Nausea And Vomiting   Lipitor [Atorvastatin] Nausea Only    By patient report 12/02/2011. Had also been prescribed pravastatin and lovastatin by previous MD. Unclear if those caused same side effects.      CBC    Component Value Date/Time   WBC 6.7 01/13/2023 0636   RBC 3.95 01/13/2023 0636   HGB 10.4 (L) 01/13/2023 0636   HGB 12.1 05/09/2021 1342   HCT 33.2 (L) 01/13/2023 0636   HCT 35.1 05/09/2021 1342   PLT 232 01/13/2023 0636   PLT 325 05/09/2021 1342   MCV 84.1 01/13/2023 0636   MCV 106 (H) 05/09/2021 1342   MCV 96 02/02/2012 1114   MCH 26.3 01/13/2023 0636   MCHC 31.3 01/13/2023 0636   RDW 14.3 01/13/2023 0636   RDW 16.9 (H) 05/09/2021 1342   RDW 14.2 02/02/2012 1114   LYMPHSABS 1.3 01/11/2023 0838   LYMPHSABS 1.1 05/09/2021 1342   MONOABS 0.4 01/11/2023 0838   EOSABS 0.4 01/11/2023 0838   EOSABS 0.4 05/09/2021 1342   BASOSABS 0.0 01/11/2023 0838   BASOSABS 0.0 05/09/2021 1342    Pulmonary Functions Testing Results:    Latest Ref Rng & Units 10/22/2022    2:37 PM  PFT Results  FVC-Pre L 1.55   FVC-Predicted Pre % 66   FVC-Post L 1.63   FVC-Predicted Post % 70   Pre FEV1/FVC % % 75   Post FEV1/FCV % % 72   FEV1-Pre L 1.16   FEV1-Predicted Pre % 65   FEV1-Post L 1.18   DLCO uncorrected ml/min/mmHg 9.36   DLCO UNC% % 60   DLVA Predicted % 79   TLC L 3.49   TLC % Predicted % 87    RV % Predicted % 104     Outpatient Medications Prior to Visit  Medication Sig Dispense Refill   albuterol (PROVENTIL) (2.5 MG/3ML) 0.083% nebulizer solution Take 2.5 mg by nebulization every 6 (six) hours.     aspirin EC 81 MG tablet Take 1 tablet (81 mg total) by mouth daily. Swallow whole. 30 tablet 12   busPIRone (BUSPAR) 5 MG tablet Take 5 mg by mouth 3 (three) times daily.     Cholecalciferol (VITAMIN D) 50 MCG (2000 UT) CAPS Take 1,000 Units by mouth daily.     clopidogrel (PLAVIX) 75 MG tablet Take 1 tablet (75 mg total) by mouth daily. 90 tablet 4   dapagliflozin propanediol (FARXIGA) 10 MG TABS tablet Take 1 tablet (10 mg total) by mouth daily before breakfast. 30 tablet 3   diclofenac Sodium (VOLTAREN)  1 % GEL Apply 4 g topically 4 (four) times daily. (Patient taking differently: Apply 2 g topically every 6 (six) hours as needed (pain). Apply to back of neck, not to exceed 16 grams per day.) 350 g 3   econazole nitrate 1 % cream Apply 1 Application topically 2 (two) times daily as needed.     ezetimibe (ZETIA) 10 MG tablet Take 10 mg by mouth daily.     Fluticasone-Umeclidin-Vilant (TRELEGY ELLIPTA) 200-62.5-25 MCG/ACT AEPB Inhale 1 puff into the lungs daily. 30 each 12   gabapentin (NEURONTIN) 300 MG capsule Take 300 mg by mouth 2 (two) times daily.     hydrocortisone cream 1 % Apply 1 Application topically every 6 (six) hours as needed for itching.     ketoconazole (NIZORAL) 2 % shampoo Apply 1 Application topically 2 (two) times a week. Apply to scalp twice a week during shower on Wednesday and Saturday. Lather and allow to sit for 5 minutes before rinsing.     levothyroxine (SYNTHROID) 50 MCG tablet Take 50 mcg by mouth daily before breakfast.     loperamide (IMODIUM) 2 MG capsule Take by mouth as needed for diarrhea or loose stools.     Magnesium 400 MG TABS Take 1 tablet by mouth 2 (two) times daily.     metFORMIN (GLUCOPHAGE) 500 MG tablet Take 500 mg by mouth 2 (two) times  daily with a meal.     metoprolol succinate (TOPROL XL) 100 MG 24 hr tablet Take 1 tablet (100 mg total) by mouth daily. Take with or immediately following a meal. 30 tablet 11   miconazole (MICOTIN) 2 % powder Apply 1 Application topically daily. Apply to affected areas on abdomen daily.     omeprazole (PRILOSEC) 40 MG capsule Take 1 capsule (40 mg total) by mouth 2 (two) times daily. Take in the morning and at dinnertime. 120 capsule 0   ondansetron (ZOFRAN) 4 MG tablet Take 4 mg by mouth every 6 (six) hours as needed for nausea.     ranolazine (RANEXA) 500 MG 12 hr tablet Take 1 tablet (500 mg total) by mouth 2 (two) times daily. 60 tablet 2   senna (SENOKOT) 8.6 MG TABS tablet Take 2 tablets by mouth daily.     sertraline (ZOLOFT) 50 MG tablet Take 1 tablet (50 mg total) by mouth daily. 90 tablet 4   vitamin B-12 (CYANOCOBALAMIN) 1000 MCG tablet Take 1 tablet (1,000 mcg total) by mouth daily. 30 tablet 0   white petrolatum ointment Apply 1 application  topically daily. After washing with soap and water, apply petrolatum to left lateral back (biopsy site) once a day and cover with Band-Aid.     zaleplon (SONATA) 5 MG capsule Take 5 mg by mouth at bedtime.     No facility-administered medications prior to visit.

## 2023-04-14 DIAGNOSIS — M6281 Muscle weakness (generalized): Secondary | ICD-10-CM | POA: Diagnosis not present

## 2023-04-14 DIAGNOSIS — K219 Gastro-esophageal reflux disease without esophagitis: Secondary | ICD-10-CM | POA: Diagnosis not present

## 2023-04-14 DIAGNOSIS — E538 Deficiency of other specified B group vitamins: Secondary | ICD-10-CM | POA: Diagnosis not present

## 2023-04-14 DIAGNOSIS — L0291 Cutaneous abscess, unspecified: Secondary | ICD-10-CM | POA: Diagnosis not present

## 2023-04-14 DIAGNOSIS — G8929 Other chronic pain: Secondary | ICD-10-CM | POA: Diagnosis not present

## 2023-04-15 ENCOUNTER — Encounter: Payer: Self-pay | Admitting: Sleep Medicine

## 2023-04-15 ENCOUNTER — Institutional Professional Consult (permissible substitution): Admitting: Sleep Medicine

## 2023-04-15 DIAGNOSIS — M6281 Muscle weakness (generalized): Secondary | ICD-10-CM | POA: Diagnosis not present

## 2023-04-16 DIAGNOSIS — E612 Magnesium deficiency: Secondary | ICD-10-CM | POA: Diagnosis not present

## 2023-04-16 DIAGNOSIS — M6281 Muscle weakness (generalized): Secondary | ICD-10-CM | POA: Diagnosis not present

## 2023-04-16 DIAGNOSIS — E039 Hypothyroidism, unspecified: Secondary | ICD-10-CM | POA: Diagnosis not present

## 2023-04-16 DIAGNOSIS — E119 Type 2 diabetes mellitus without complications: Secondary | ICD-10-CM | POA: Diagnosis not present

## 2023-04-16 DIAGNOSIS — I10 Essential (primary) hypertension: Secondary | ICD-10-CM | POA: Diagnosis not present

## 2023-04-18 DIAGNOSIS — M6281 Muscle weakness (generalized): Secondary | ICD-10-CM | POA: Diagnosis not present

## 2023-04-19 DIAGNOSIS — M6281 Muscle weakness (generalized): Secondary | ICD-10-CM | POA: Diagnosis not present

## 2023-04-19 IMAGING — CT CT HEAD W/O CM
4 series · 16 of 47 positions shown, 18 images · non-contrast
Comparison: MRI 08/24/2020, CT brain 07/21/2018

CLINICAL DATA: Head trauma hit right-side of head

EXAM:
CT HEAD WITHOUT CONTRAST
CT CERVICAL SPINE WITHOUT CONTRAST
TECHNIQUE: Multidetector CT imaging of the head and cervical spine was
performed following the standard protocol without intravenous
contrast. Multiplanar CT image reconstructions of the cervical spine
were also generated.

[Series 2: head wo · axial · 0.39mm/px · z∈[-125,-25]mm · 7 of 28 slices shown, 9 images]
[im 4/28  brain]
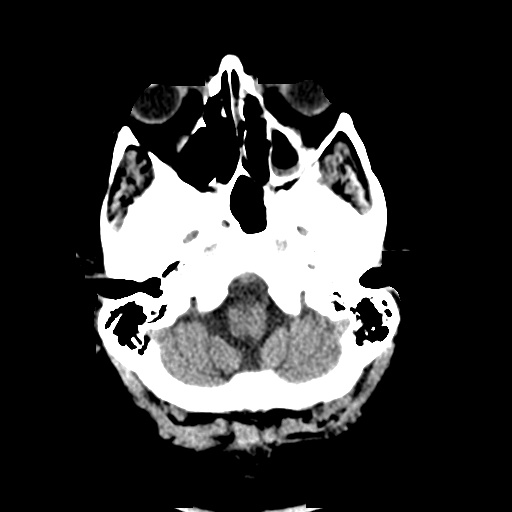
[im 4/28  bone]
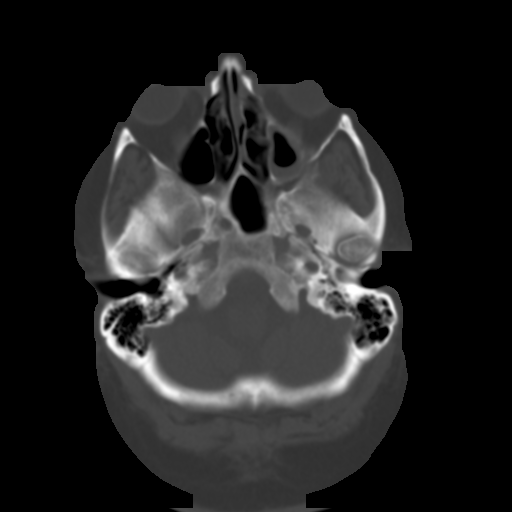
[im 7/28  brain]
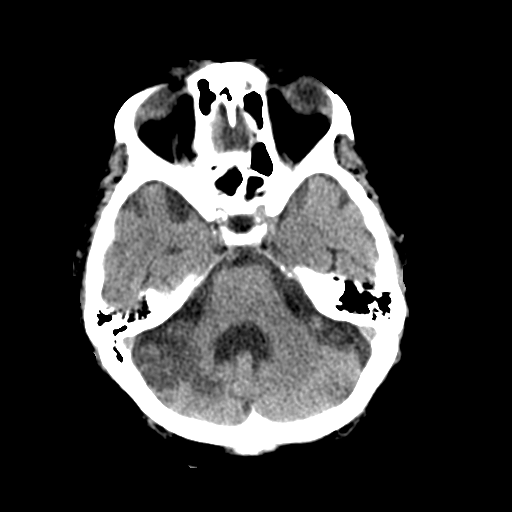
[im 11/28  brain]
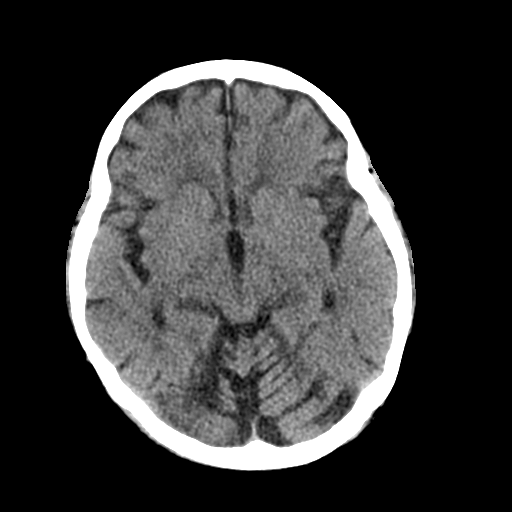
[im 14/28  brain]
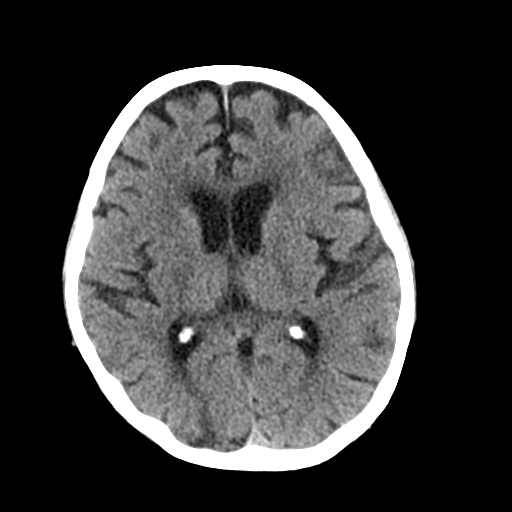
[im 17/28  brain]
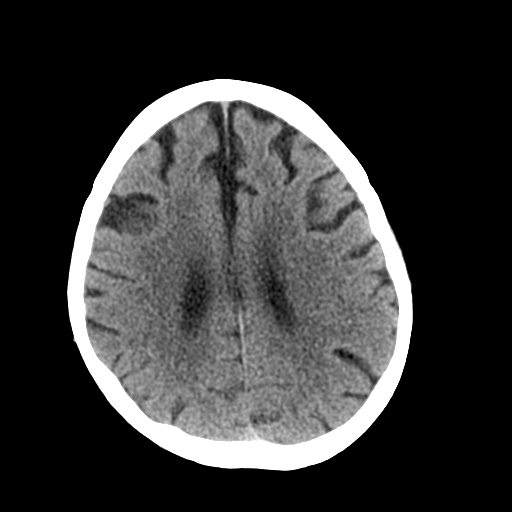
[im 17/28  bone]
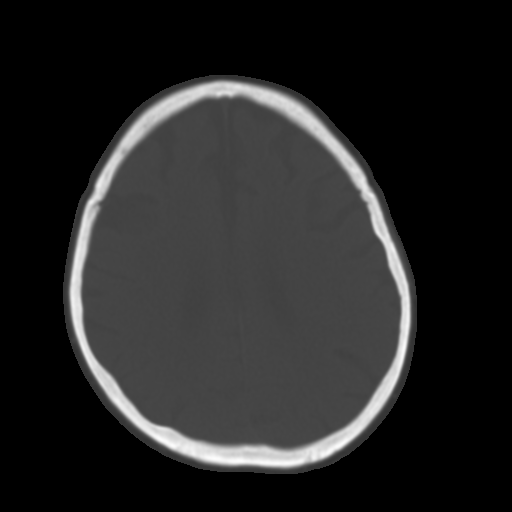
[im 21/28  brain]
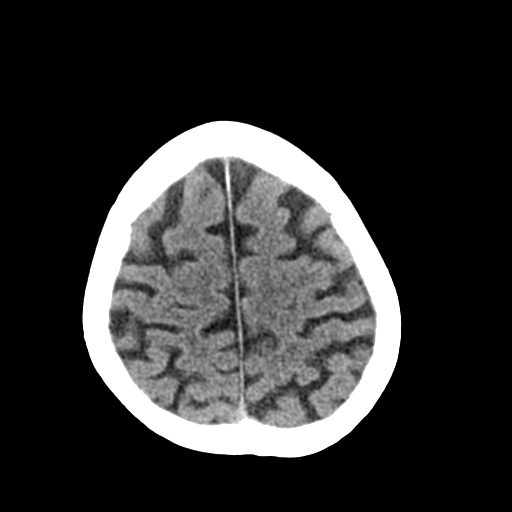
[im 24/28  brain]
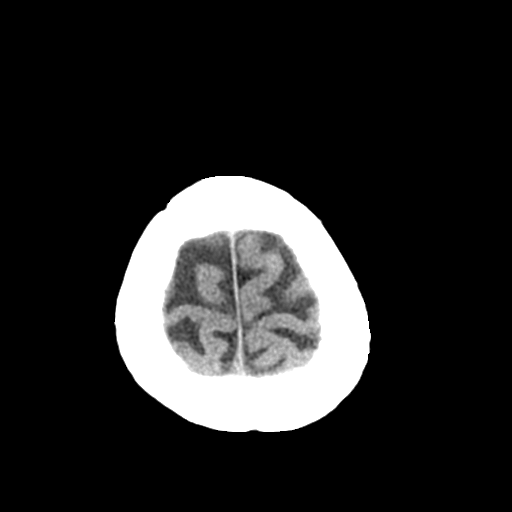

[Series 3: head bone · axial · 0.39mm/px · z∈[-128,-100]mm · 3 of 68 slices shown]
[im 7/68  bone]
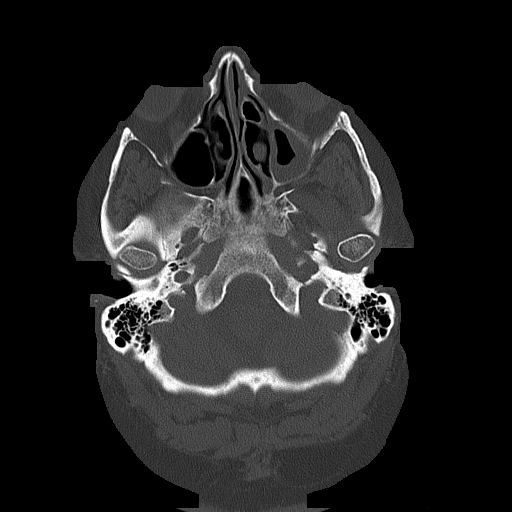
[im 14/68  bone]
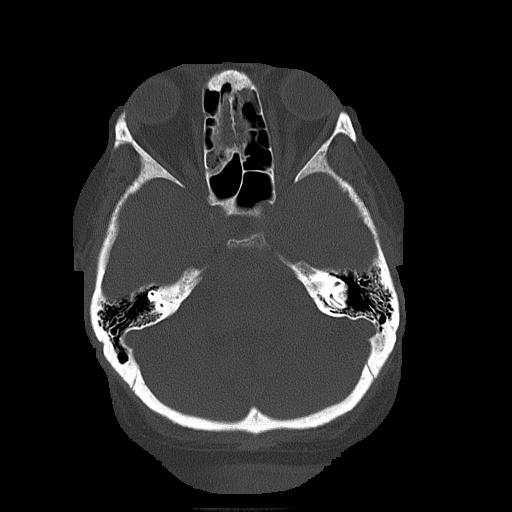
[im 21/68  bone]
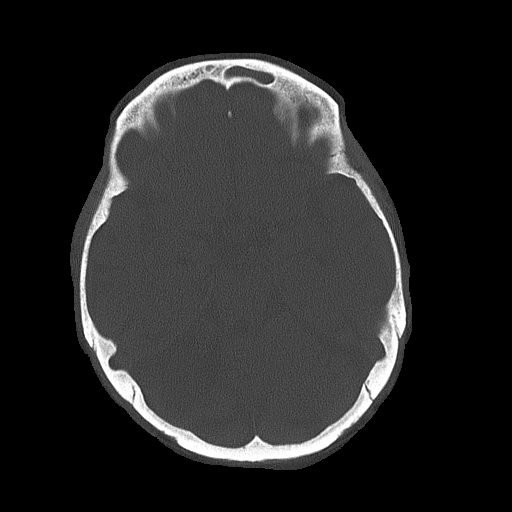

[Series 4: coronal soft tissue · coronal · 0.28mm/px · 3 of 59 slices shown]
[im 20/59  brain]
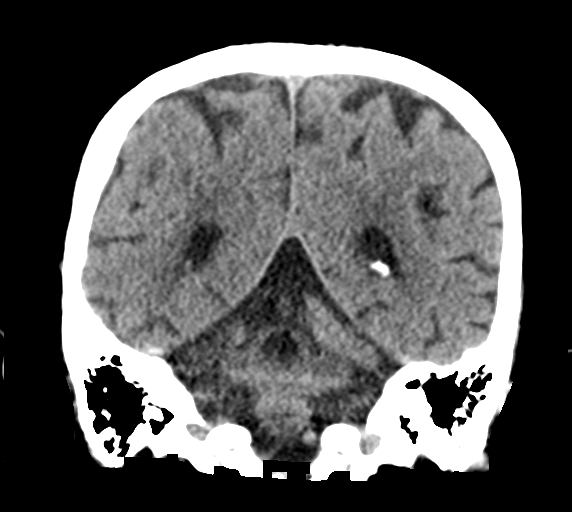
[im 26/59  brain]
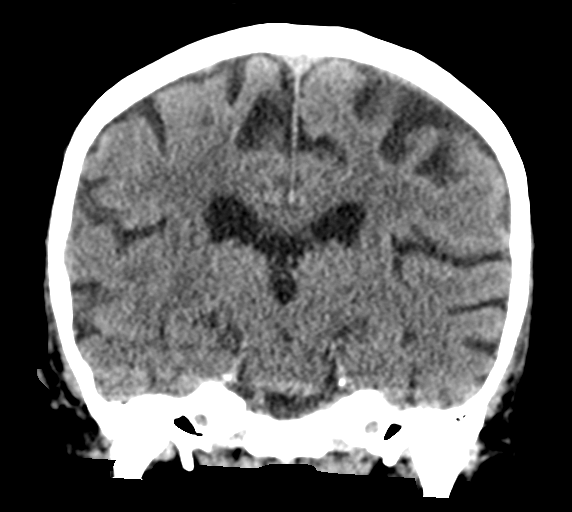
[im 33/59  brain]
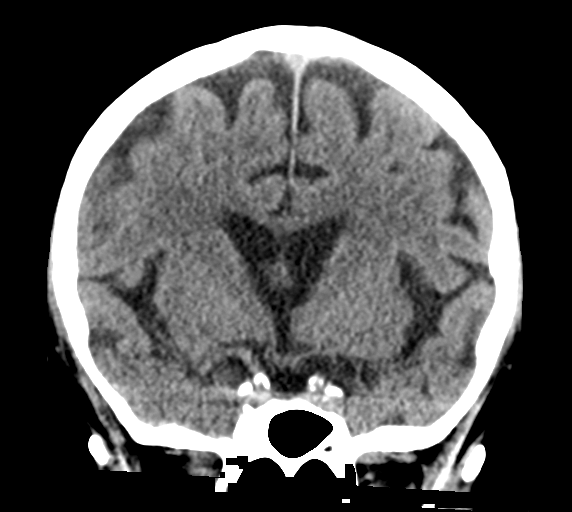

[Series 5: sagittal soft tissue · sagittal · 0.28mm/px · 3 of 55 slices shown]
[im 19/55  brain]
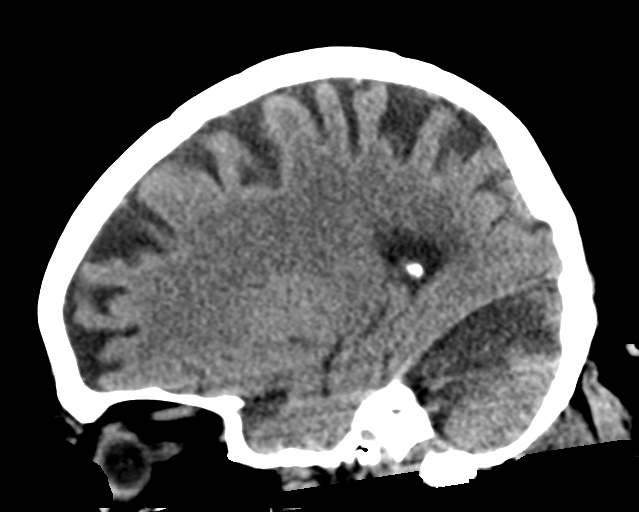
[im 28/55  brain]
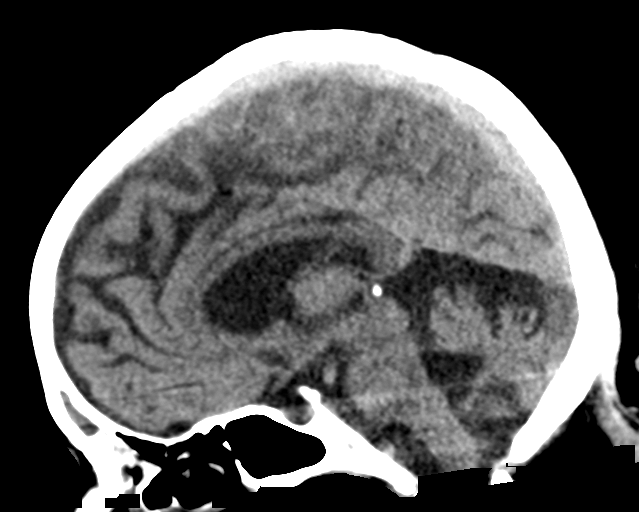
[im 37/55  brain]
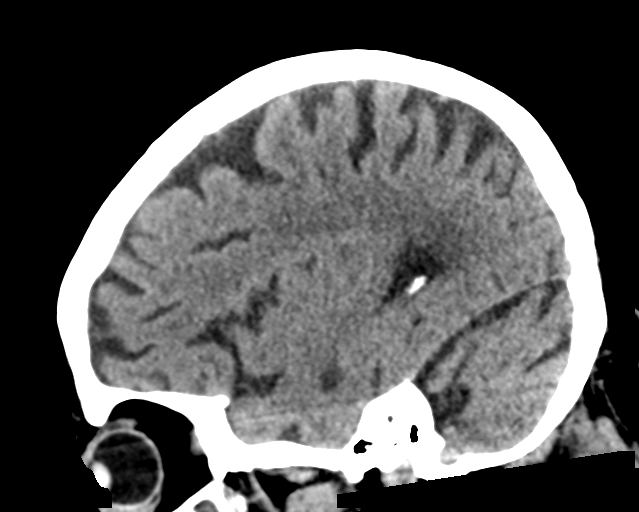

[16 of 47 positions shown; findings below may reference images not displayed]

FINDINGS: CT HEAD FINDINGS

Brain: No acute territorial infarction, hemorrhage or intracranial
mass. Encephalomalacia within the right greater than left cerebellar
hemispheres. Small right occipital chronic appearing infarct.
Cerebellar atrophy. Moderate generalized atrophy with mild chronic
small vessel ischemic changes of the white matter. Nonenlarged
ventricles

Vascular: No hyperdense vessels.  Carotid vascular calcification

Skull: Normal. Negative for fracture or focal lesion.

Sinuses/Orbits: Mucosal thickening in the sinuses

Other: None

CT CERVICAL SPINE FINDINGS

Alignment: Mild kyphosis of cervical spine. Grade 1 anterolisthesis
C 4 on C5, likely degenerative. Facet alignment is maintained.

Skull base and vertebrae: No acute fracture. No primary bone lesion
or focal pathologic process.

Soft tissues and spinal canal: No prevertebral fluid or swelling. No
visible canal hematoma.

Disc levels: Advanced degenerative change C5-C6 with moderate
degenerative change at C6-C7. Facet degenerative changes at multiple
levels with foraminal narrowing

Upper chest: Negative.

Other: None
IMPRESSION: 1. No CT evidence for acute intracranial abnormality. Chronic
cerebellar cerebellar and right occipital infarcts. Atrophy and
chronic small vessel ischemic changes of the white matter
2. Mild kyphosis of the cervical spine without acute fracture
identified. Degenerative changes. Grade 1 anterolisthesis C4 on C5
is probably degenerative

## 2023-04-19 IMAGING — CR DG RIBS W/ CHEST 3+V*R*
3 series · 3 of 3 positions shown · non-contrast
Comparison: Chest radiograph 03/16/2019.  Chest CT 03/06/2020

CLINICAL DATA: Fall onto right side.

EXAM:
RIGHT RIBS AND CHEST - 3+ VIEW

[chest pa]
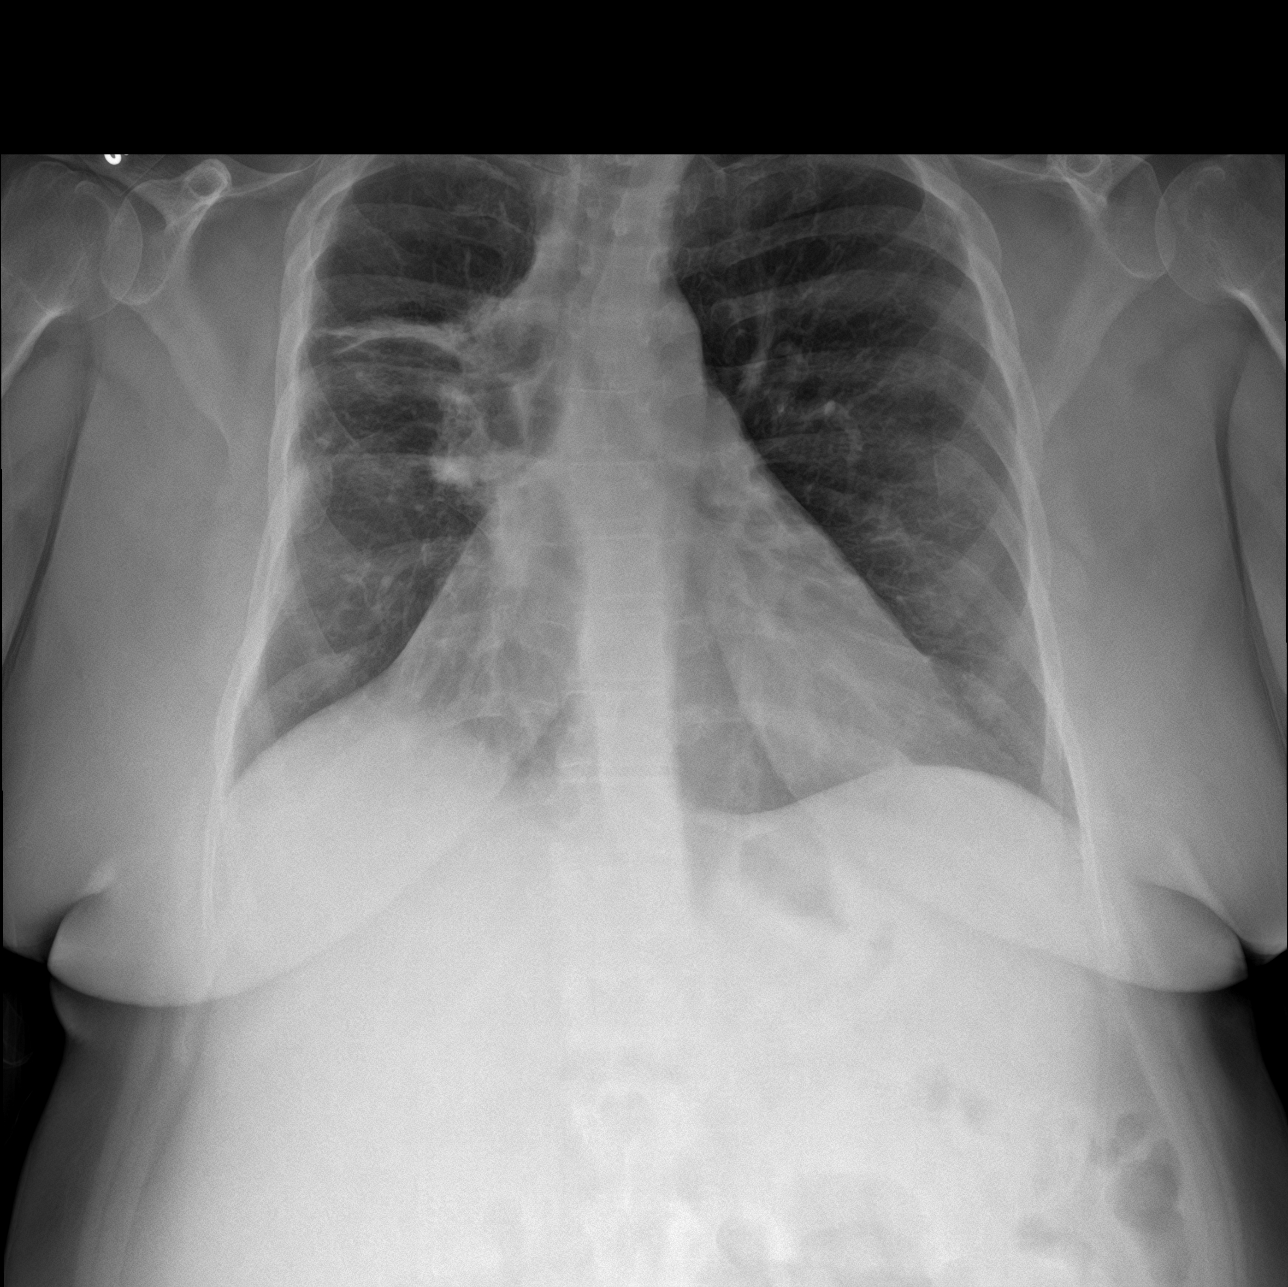

[rib pa]
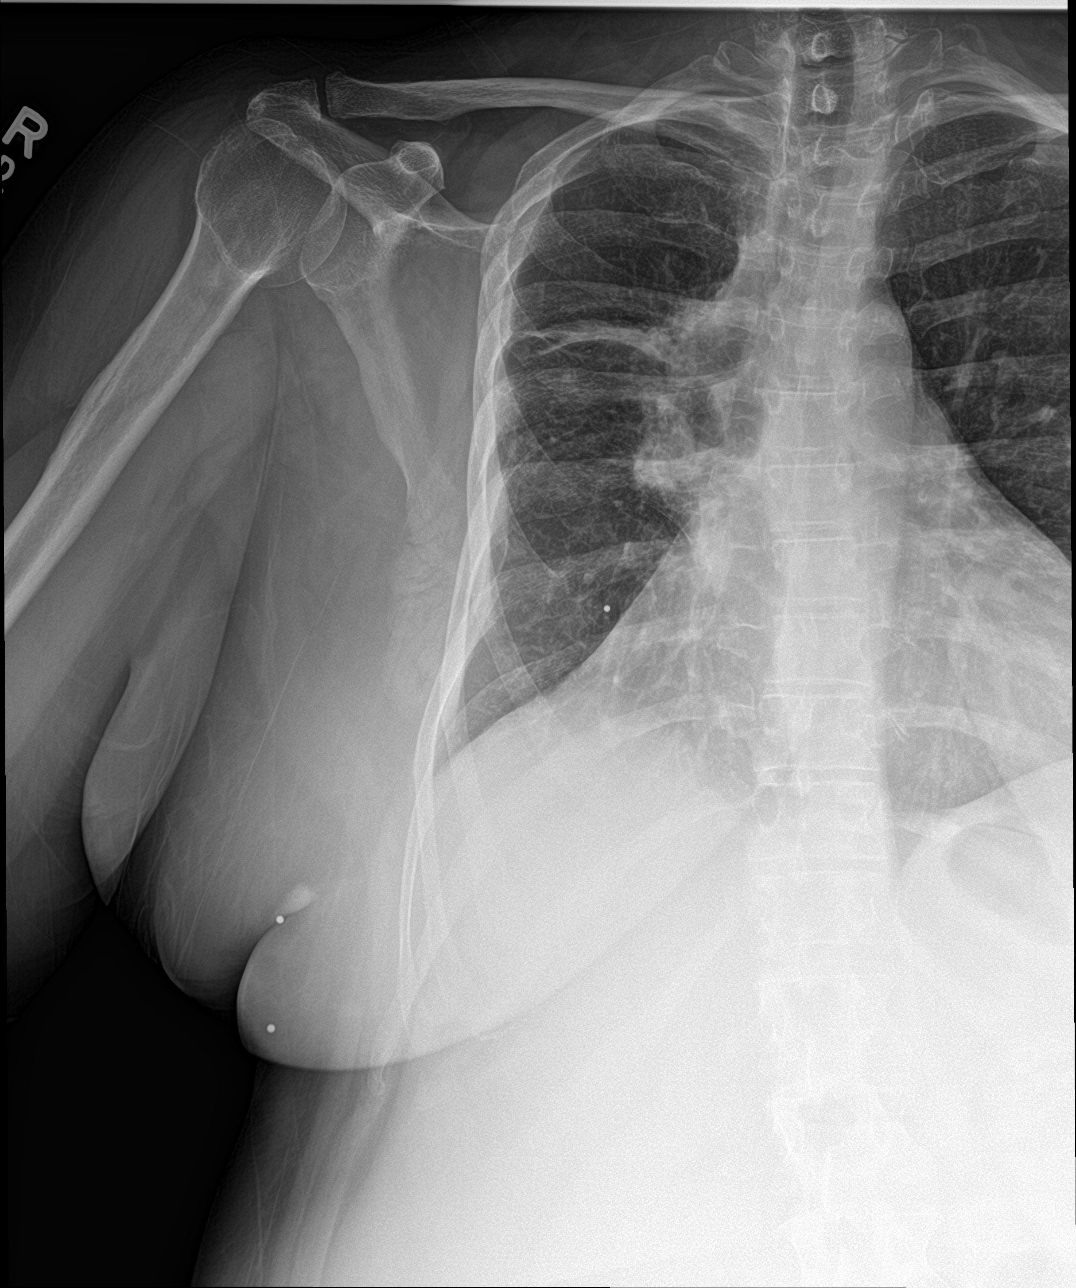

[rib ap obl]
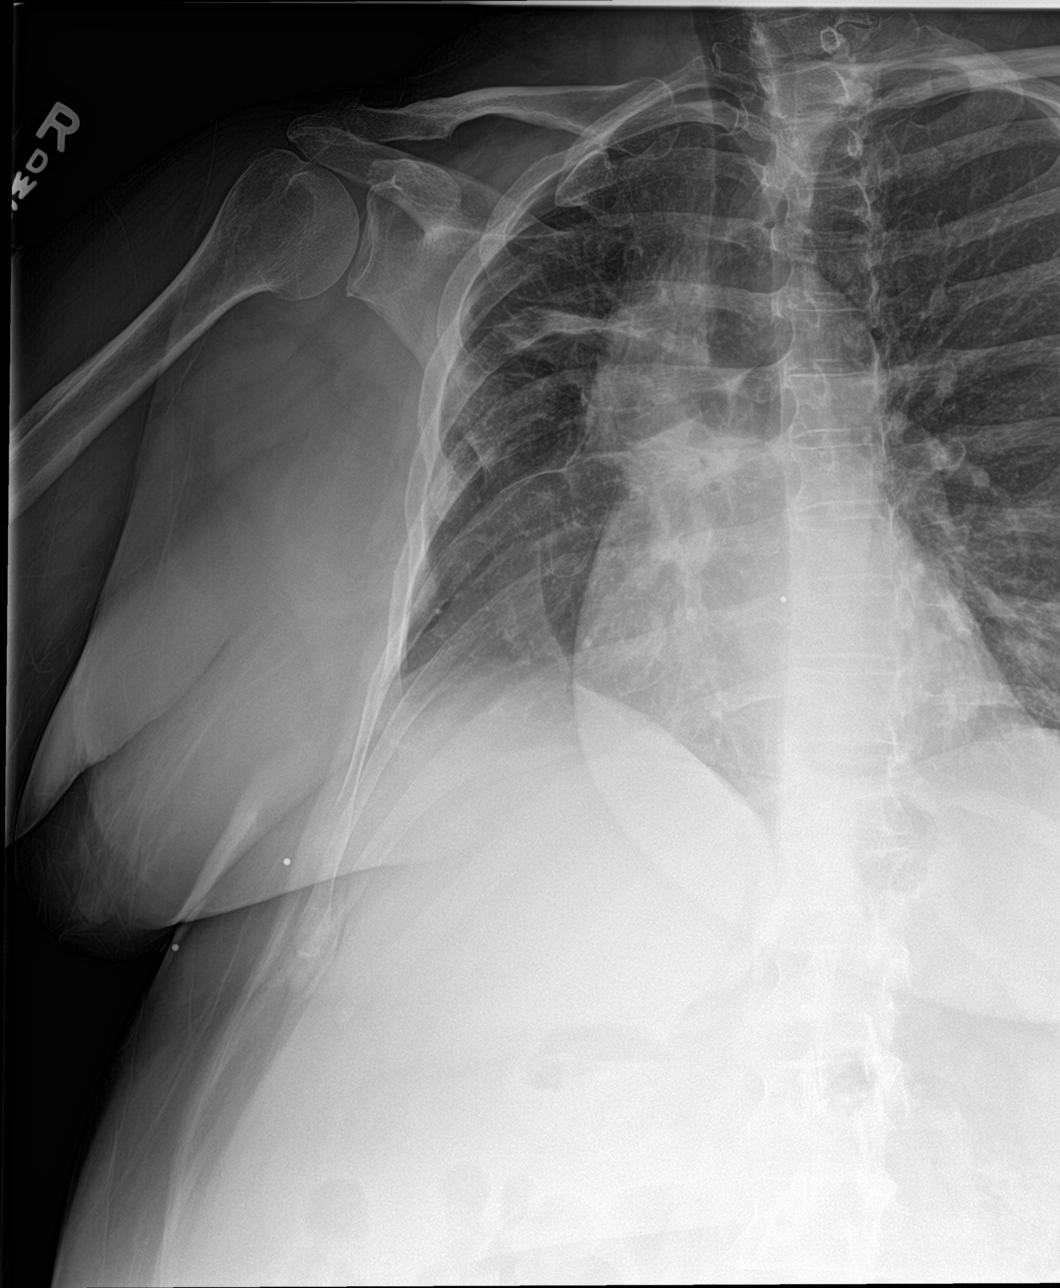

[3 of 3 positions shown; findings below may reference images not displayed]

FINDINGS: No acute right rib fracture. No obvious focal bone lesion. Chronic
volume loss in the right hemithorax with bandlike opacity in the
upper lung zone. No pneumothorax. No pleural effusion. Stable heart
size and mediastinal contours.
IMPRESSION: 1. No acute right rib fracture or pulmonary complication.
2. Chronic treatment related change with volume loss in the right
hemithorax and upper lung zone scarring.

## 2023-04-19 IMAGING — CR DG HIP (WITH OR WITHOUT PELVIS) 2-3V*R*
3 series · 3 of 3 positions shown · non-contrast
Comparison: None.

CLINICAL DATA: Fall.  Tripped on a curb landing on right side.

EXAM:
DG HIP (WITH OR WITHOUT PELVIS) 2-3V RIGHT

[pelvis ap]
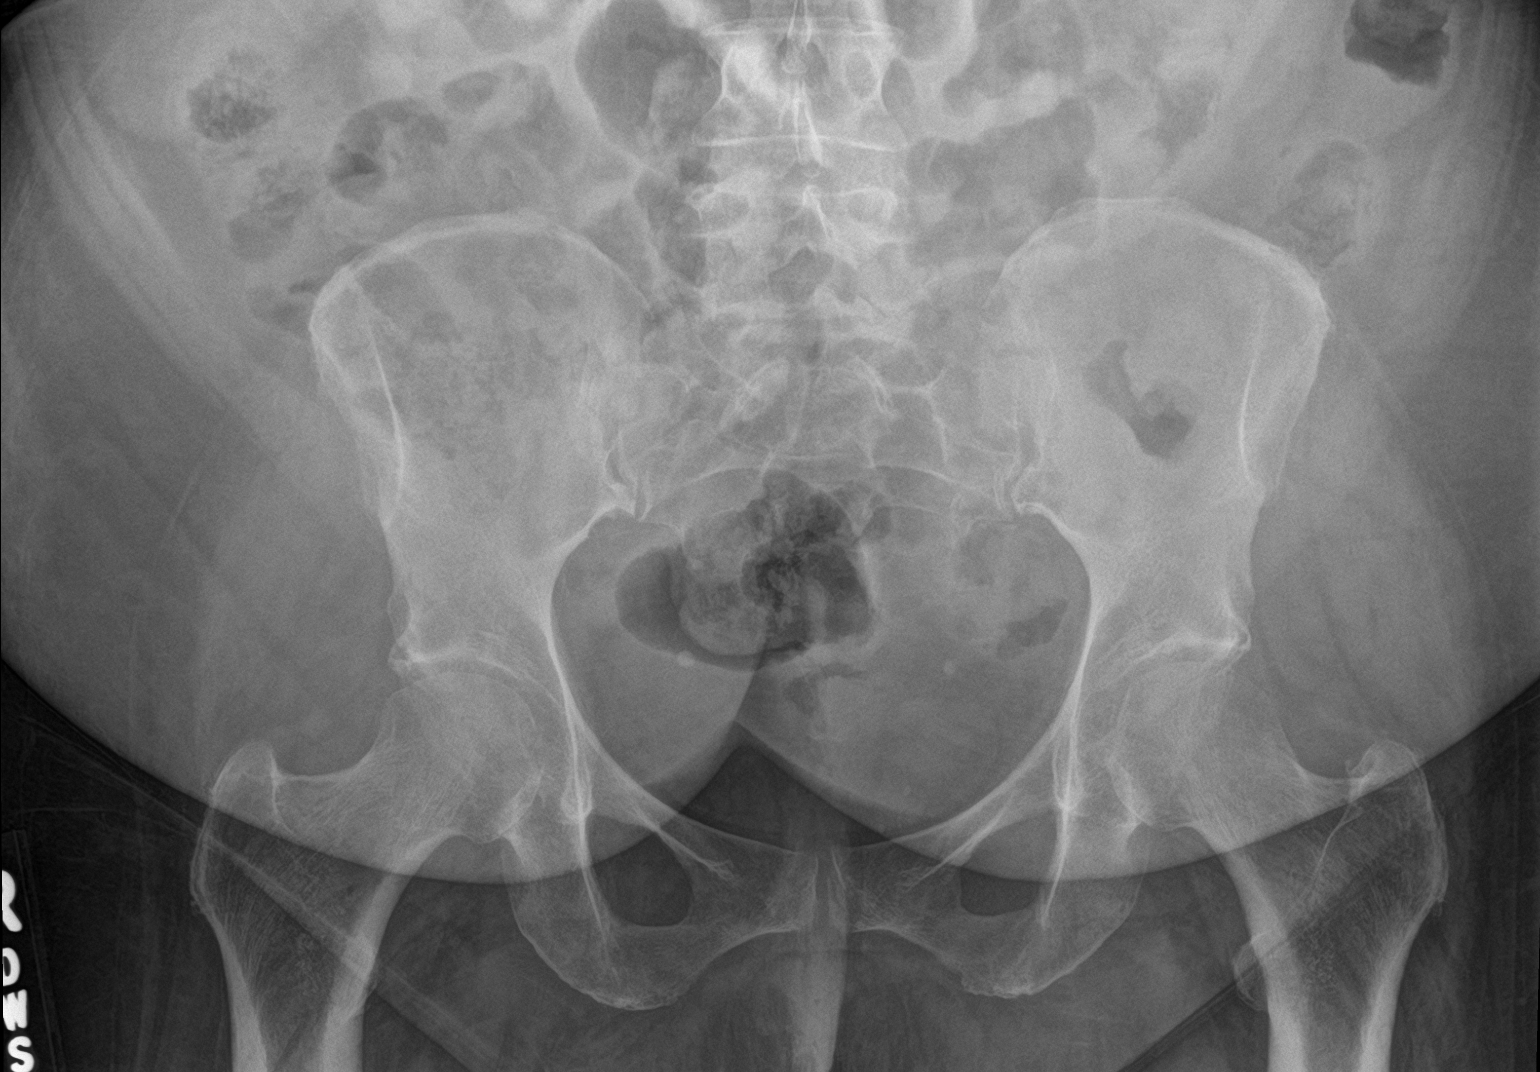

[hip ap]
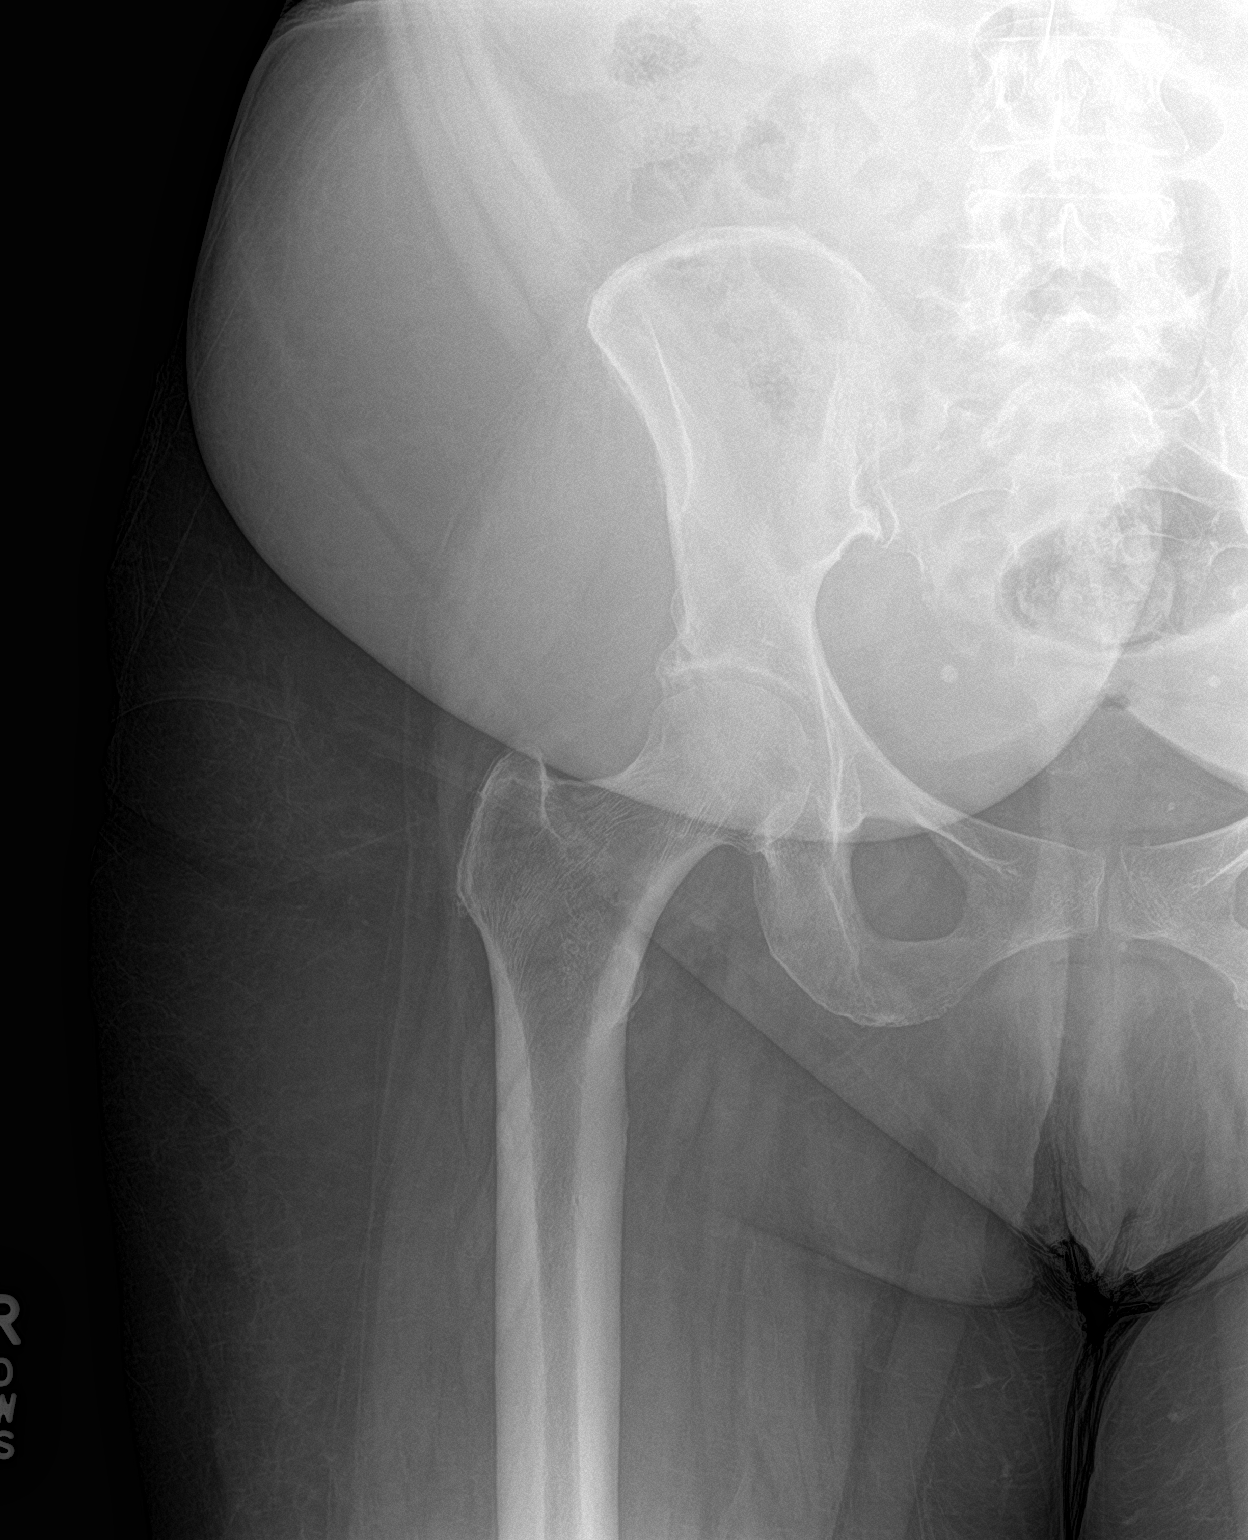

[hip lat]
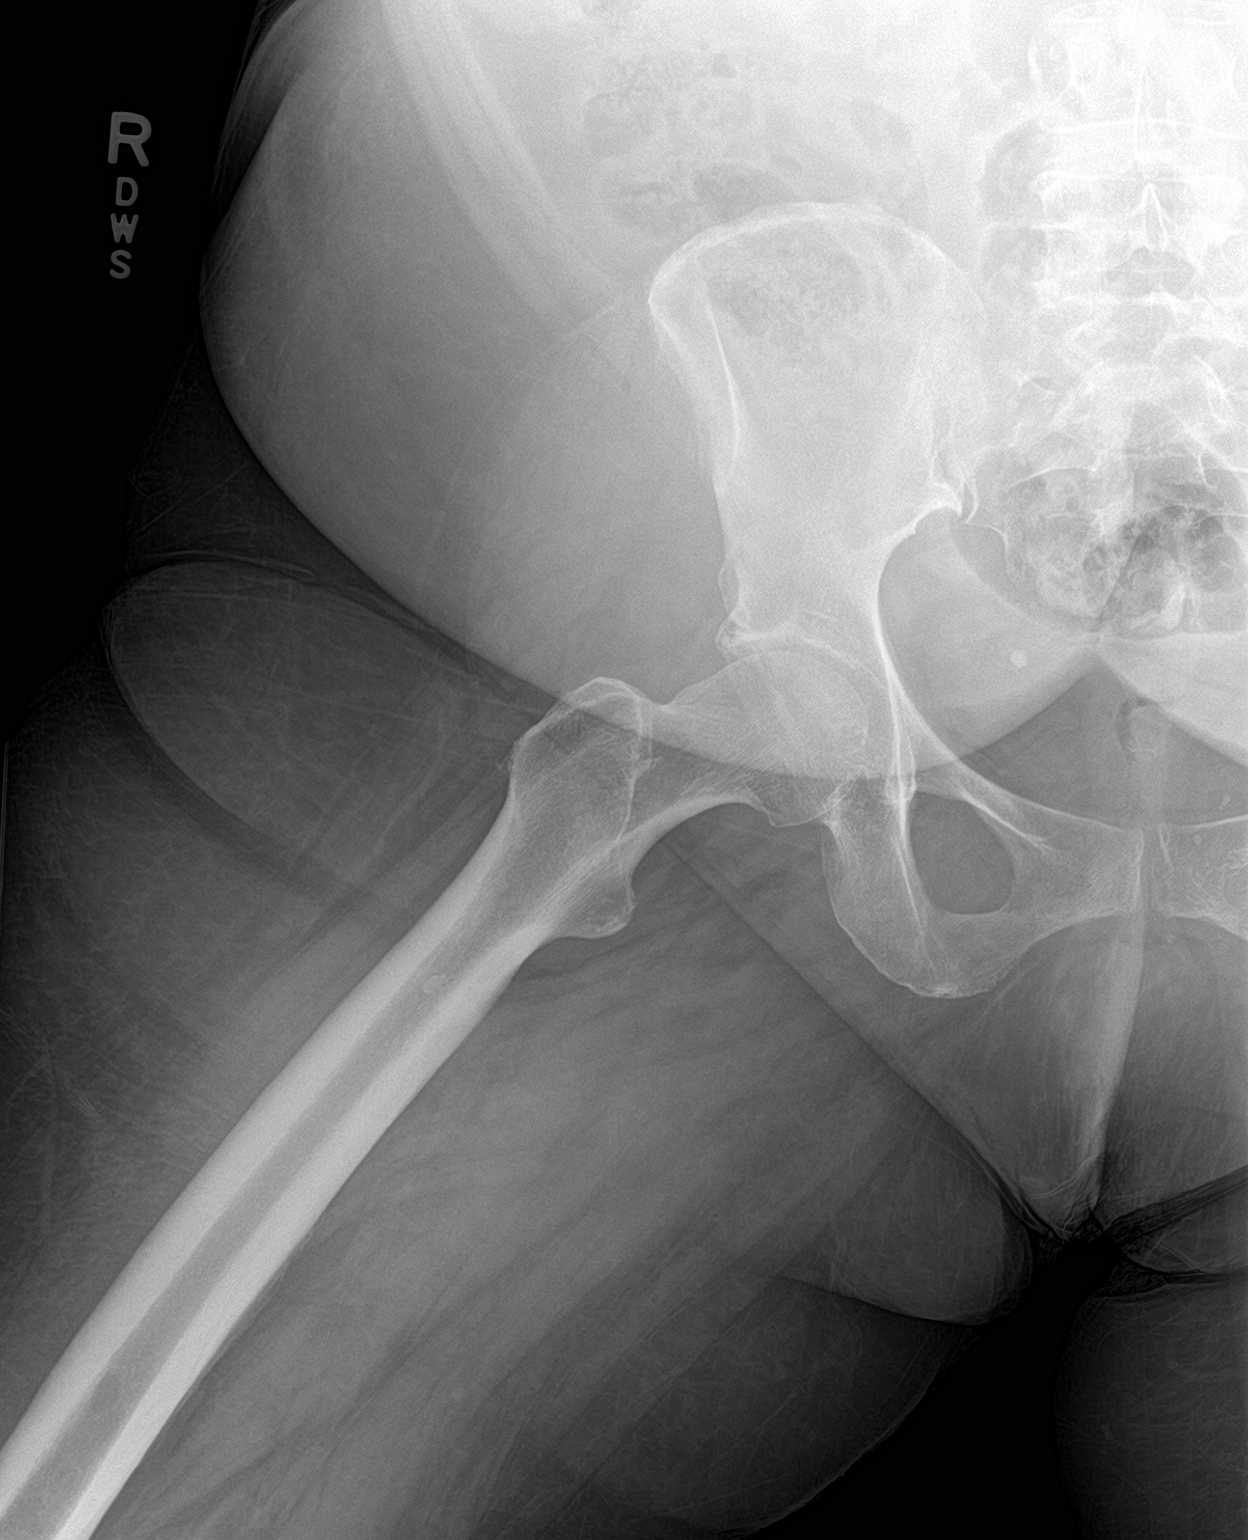

[3 of 3 positions shown; findings below may reference images not displayed]

FINDINGS: Cortical margins of the pelvis and right hip are intact. No evidence
of fracture. The femoral head is seated in the acetabulum. There is
mild lateral acetabular spurring. Intact pubic rami and remainder of
the bony pelvis. Pubic symphysis and sacroiliac joints are
congruent. No focal soft tissue abnormality is seen.
IMPRESSION: No fracture of the pelvis or right hip.

## 2023-04-21 DIAGNOSIS — M6281 Muscle weakness (generalized): Secondary | ICD-10-CM | POA: Diagnosis not present

## 2023-04-21 DIAGNOSIS — E119 Type 2 diabetes mellitus without complications: Secondary | ICD-10-CM | POA: Diagnosis not present

## 2023-04-21 DIAGNOSIS — E039 Hypothyroidism, unspecified: Secondary | ICD-10-CM | POA: Diagnosis not present

## 2023-04-22 DIAGNOSIS — M6281 Muscle weakness (generalized): Secondary | ICD-10-CM | POA: Diagnosis not present

## 2023-04-23 DIAGNOSIS — M6281 Muscle weakness (generalized): Secondary | ICD-10-CM | POA: Diagnosis not present

## 2023-04-24 DIAGNOSIS — M6281 Muscle weakness (generalized): Secondary | ICD-10-CM | POA: Diagnosis not present

## 2023-04-25 DIAGNOSIS — M6281 Muscle weakness (generalized): Secondary | ICD-10-CM | POA: Diagnosis not present

## 2023-04-25 DIAGNOSIS — R051 Acute cough: Secondary | ICD-10-CM | POA: Diagnosis not present

## 2023-04-28 DIAGNOSIS — C3490 Malignant neoplasm of unspecified part of unspecified bronchus or lung: Secondary | ICD-10-CM | POA: Diagnosis not present

## 2023-04-28 DIAGNOSIS — M6281 Muscle weakness (generalized): Secondary | ICD-10-CM | POA: Diagnosis not present

## 2023-04-28 DIAGNOSIS — R9389 Abnormal findings on diagnostic imaging of other specified body structures: Secondary | ICD-10-CM | POA: Diagnosis not present

## 2023-04-29 DIAGNOSIS — M6281 Muscle weakness (generalized): Secondary | ICD-10-CM | POA: Diagnosis not present

## 2023-04-30 DIAGNOSIS — M6281 Muscle weakness (generalized): Secondary | ICD-10-CM | POA: Diagnosis not present

## 2023-05-01 DIAGNOSIS — M6281 Muscle weakness (generalized): Secondary | ICD-10-CM | POA: Diagnosis not present

## 2023-05-02 DIAGNOSIS — M6281 Muscle weakness (generalized): Secondary | ICD-10-CM | POA: Diagnosis not present

## 2023-05-05 DIAGNOSIS — Z1211 Encounter for screening for malignant neoplasm of colon: Secondary | ICD-10-CM | POA: Diagnosis not present

## 2023-05-05 DIAGNOSIS — Z23 Encounter for immunization: Secondary | ICD-10-CM | POA: Diagnosis not present

## 2023-05-05 DIAGNOSIS — G8929 Other chronic pain: Secondary | ICD-10-CM | POA: Diagnosis not present

## 2023-05-05 DIAGNOSIS — Z1239 Encounter for other screening for malignant neoplasm of breast: Secondary | ICD-10-CM | POA: Diagnosis not present

## 2023-05-09 DIAGNOSIS — H35011 Changes in retinal vascular appearance, right eye: Secondary | ICD-10-CM | POA: Diagnosis not present

## 2023-05-09 DIAGNOSIS — H50331 Intermittent monocular exotropia, right eye: Secondary | ICD-10-CM | POA: Diagnosis not present

## 2023-05-14 DIAGNOSIS — M7989 Other specified soft tissue disorders: Secondary | ICD-10-CM | POA: Diagnosis not present

## 2023-05-14 DIAGNOSIS — D509 Iron deficiency anemia, unspecified: Secondary | ICD-10-CM | POA: Diagnosis not present

## 2023-05-14 DIAGNOSIS — L0390 Cellulitis, unspecified: Secondary | ICD-10-CM | POA: Diagnosis not present

## 2023-05-14 DIAGNOSIS — K580 Irritable bowel syndrome with diarrhea: Secondary | ICD-10-CM | POA: Diagnosis not present

## 2023-05-15 DIAGNOSIS — R2231 Localized swelling, mass and lump, right upper limb: Secondary | ICD-10-CM | POA: Diagnosis not present

## 2023-05-16 ENCOUNTER — Encounter (INDEPENDENT_AMBULATORY_CARE_PROVIDER_SITE_OTHER): Payer: 59

## 2023-05-16 ENCOUNTER — Ambulatory Visit (INDEPENDENT_AMBULATORY_CARE_PROVIDER_SITE_OTHER): Payer: 59 | Admitting: Nurse Practitioner

## 2023-05-19 ENCOUNTER — Other Ambulatory Visit (INDEPENDENT_AMBULATORY_CARE_PROVIDER_SITE_OTHER): Payer: Self-pay | Admitting: Nurse Practitioner

## 2023-05-19 DIAGNOSIS — I6523 Occlusion and stenosis of bilateral carotid arteries: Secondary | ICD-10-CM

## 2023-05-21 ENCOUNTER — Ambulatory Visit (INDEPENDENT_AMBULATORY_CARE_PROVIDER_SITE_OTHER): Payer: 59

## 2023-05-21 ENCOUNTER — Encounter (INDEPENDENT_AMBULATORY_CARE_PROVIDER_SITE_OTHER): Payer: Self-pay | Admitting: Nurse Practitioner

## 2023-05-21 ENCOUNTER — Ambulatory Visit (INDEPENDENT_AMBULATORY_CARE_PROVIDER_SITE_OTHER): Payer: 59 | Admitting: Nurse Practitioner

## 2023-05-21 VITALS — BP 116/76 | HR 98 | Resp 18 | Ht <= 58 in | Wt 187.0 lb

## 2023-05-21 DIAGNOSIS — I6501 Occlusion and stenosis of right vertebral artery: Secondary | ICD-10-CM

## 2023-05-21 DIAGNOSIS — R809 Proteinuria, unspecified: Secondary | ICD-10-CM | POA: Diagnosis not present

## 2023-05-21 DIAGNOSIS — I6523 Occlusion and stenosis of bilateral carotid arteries: Secondary | ICD-10-CM

## 2023-05-21 DIAGNOSIS — E1129 Type 2 diabetes mellitus with other diabetic kidney complication: Secondary | ICD-10-CM

## 2023-05-26 NOTE — Progress Notes (Signed)
 Subjective:    Patient ID: Yvette Riley, female    DOB: Jun 26, 1955, 68 y.o.   MRN: [id number] Chief Complaint  Patient presents with   Follow-up    6 month carotid    Yvette Riley is a 68 year old female who is referred today due to concern for possible carotid stenosis.  The patient has had multiple strokes previously had some underlying weakness.  At an outside facility she underwent a carotid artery duplex which shows a 0 to 49% stenosis in the bilateral internal carotid arteries.  At that time the right vertebral artery was not seen in the left vertebral artery had antegrade flow.  In reviewing the noninvasive studies the velocities are very low and because no plaque is identified is suggestive that stenosis is likely at about 0%.  When utilizing criteria here and [facility] she would be considered a 1 to 39% stenosis which is very minimal.  She has not had any recent CVA-like symptoms.  Today, her noninvasive studies show that she continues to have 139% stenosis bilaterally.  She continues to have a no right vertebral artery occlusion with antegrade flow in the left vertebral artery.  She has normal flow hemodynamics in the bilateral subclavian arteries.    Review of Systems  Neurological:  Positive for weakness.  All other systems reviewed and are negative.      Objective:   Physical Exam Vitals reviewed.  HENT:     Head: Normocephalic.  Neck:     Vascular: No carotid bruit.  Cardiovascular:     Rate and Rhythm: Normal rate and regular rhythm.     Pulses: Normal pulses.     Heart sounds: Normal heart sounds.  Pulmonary:     Effort: Pulmonary effort is normal.  Skin:    General: Skin is warm and dry.  Neurological:     Mental Status: She is alert and oriented to person, place, and time.     Motor: Weakness present.     Gait: Gait abnormal.  Psychiatric:        Mood and Affect: Mood normal.        Behavior: Behavior normal.        Thought  Content: Thought content normal.        Judgment: Judgment normal.     BP 116/76   Pulse 98   Resp 18   Ht 4\' 9"  (1.448 m)   Wt 187 lb (84.8 kg)   BMI 40.47 kg/m   Past Medical History:  Diagnosis Date   Allergy    Anemia    Anxiety    Arthritis    Asthma    Blood transfusion without reported diagnosis    C. difficile diarrhea 04/07/2016   CAP (community acquired pneumonia) 03/21/2015   Combined forms of age-related cataract of both eyes 05/06/2016   COPD (chronic obstructive pulmonary disease) (HCC)    CVA (cerebral vascular accident) (HCC) 06/17/2018   Diabetes mellitus without complication (HCC)    Displacement of lumbar intervertebral disc without myelopathy    Emphysema of lung (HCC)    GERD (gastroesophageal reflux disease)    Glaucoma    History of chicken pox    Hyperlipidemia    Hypertension    NSTEMI (non-ST elevated myocardial infarction) (HCC) 01/07/2023   Pneumonia 03/29/2015   Sepsis (HCC) 03/17/2016   Shortness of breath    Small cell lung cancer (HCC)     Social History   Socioeconomic History  Marital status: Widowed    Spouse name: Not on file   Number of children: 2   Years of education: Not on file   Highest education level: 10th grade  Occupational History   Occupation: disable  Tobacco Use   Smoking status: Former    Current packs/day: 0.00    Average packs/day: 0.3 packs/day for 45.0 years (11.3 ttl pk-yrs)    Types: Cigarettes    Start date: 03/18/1971    Quit date: 03/17/2016    Years since quitting: 7.1   Smokeless tobacco: Never   Tobacco comments:    pt states she quit in 2016-2017  Vaping Use   Vaping status: Never Used  Substance and Sexual Activity   Alcohol use: No    Alcohol/week: 0.0 standard drinks of alcohol   Drug use: No   Sexual activity: Not Currently    Birth control/protection: Abstinence  Other Topics Concern   Not on file  Social History Narrative   Not on file   Social Drivers of Health   Financial  Resource Strain: Low Risk  (09/05/2021)   Overall Financial Resource Strain (CARDIA)    Difficulty of Paying Living Expenses: Not hard at all  Food Insecurity: No Food Insecurity (01/08/2023)   Hunger Vital Sign    Worried About Running Out of Food in the Last Year: Never true    Ran Out of Food in the Last Year: Never true  Transportation Needs: No Transportation Needs (01/08/2023)   PRAPARE - Administrator, Civil Service (Medical): No    Lack of Transportation (Non-Medical): No  Physical Activity: Inactive (09/05/2021)   Exercise Vital Sign    Days of Exercise per Week: 0 days    Minutes of Exercise per Session: 0 min  Stress: Stress Concern Present (09/05/2021)   [employer] of Occupational Health - Occupational Stress Questionnaire    Feeling of Stress : Very much  Social Connections: Unknown (06/01/2021)   [facility] from [facility]   Social Network    Social Network: Not on file  Intimate Partner Violence: Not At Risk (01/08/2023)   Humiliation, Afraid, Rape, and Kick questionnaire    Fear of Current or Ex-Partner: No    Emotionally Abused: No    Physically Abused: No    Sexually Abused: No    Past Surgical History:  Procedure Laterality Date   BALLOON DILATION  11/20/2022   Procedure: BALLOON DILATION;  Surgeon: Corky Diener, Alphonsus Jeans, MD;  Location: [facility] ENDOSCOPY;  Service: Gastroenterology;;   BIOPSY  11/20/2022   Procedure: BIOPSY;  Surgeon: Toledo, Alphonsus Jeans, MD;  Location: [facility] ENDOSCOPY;  Service: Gastroenterology;;   CESAREAN SECTION     CORONARY STENT INTERVENTION N/A 01/08/2023   Procedure: CORONARY STENT INTERVENTION;  Surgeon: Antonette Batters, MD;  Location: [facility] INVASIVE CV LAB;  Service: Cardiovascular;  Laterality: N/A;   ESOPHAGOGASTRODUODENOSCOPY N/A 05/17/2022   Procedure: ESOPHAGOGASTRODUODENOSCOPY (EGD);  Surgeon: Marnee Sink, MD;  Location: [facility] ENDOSCOPY;  Service: Endoscopy;  Laterality: N/A;   ESOPHAGOGASTRODUODENOSCOPY  N/A 07/10/2022   Procedure: ESOPHAGOGASTRODUODENOSCOPY (EGD);  Surgeon: Toledo, Alphonsus Jeans, MD;  Location: [facility] ENDOSCOPY;  Service: Gastroenterology;  Laterality: N/A;   ESOPHAGOGASTRODUODENOSCOPY (EGD) WITH PROPOFOL  N/A 02/08/2022   Procedure: ESOPHAGOGASTRODUODENOSCOPY (EGD) WITH PROPOFOL ;  Surgeon: Luke Salaam, MD;  Location: [facility] ENDOSCOPY;  Service: Gastroenterology;  Laterality: N/A;   ESOPHAGOGASTRODUODENOSCOPY (EGD) WITH PROPOFOL  N/A 11/20/2022   Procedure: ESOPHAGOGASTRODUODENOSCOPY (EGD) WITH PROPOFOL ;  Surgeon: Toledo, Alphonsus Jeans, MD;  Location: [facility] ENDOSCOPY;  Service:  Gastroenterology;  Laterality: N/A;   ESOPHAGOGASTRODUODENOSCOPY (EGD) WITH PROPOFOL  N/A 12/25/2022   Procedure: ESOPHAGOGASTRODUODENOSCOPY (EGD) WITH PROPOFOL ;  Surgeon: Selena Daily, MD;  Location: [facility] ENDOSCOPY;  Service: Gastroenterology;  Laterality: N/A;   LEFT HEART CATH AND CORONARY ANGIOGRAPHY N/A 01/08/2023   Procedure: LEFT HEART CATH AND CORONARY ANGIOGRAPHY;  Surgeon: Cherrie Cornwall, MD;  Location: [facility] INVASIVE CV LAB;  Service: Cardiovascular;  Laterality: N/A;   TUBAL LIGATION     UPPER GI ENDOSCOPY      Family History  Problem Relation Age of Onset   Heart failure Mother    Heart disease Mother    Stroke Mother    Heart disease Brother    COPD Brother    Cancer Maternal Aunt        Breast Cancer    Allergies  Allergen Reactions   Other Anaphylaxis   Penicillins Anaphylaxis, Rash and Other (See Comments)    Reaction:  Tongue swelling  Has patient had a PCN reaction causing immediate rash, facial/tongue/throat swelling, SOB or lightheadedness with hypotension:  Yes   Has patient had a PCN reaction causing severe rash involving mucus membranes or skin necrosis: No Has patient had a PCN reaction that required hospitalization No Has patient had a PCN reaction occurring within the last 10 years: No If all of the above answers are "NO", then may proceed with Cephalosporin use.   Yellow  Jacket Venom [Bee Venom] Anaphylaxis   Codeine Hives   Crestor [Rosuvastatin] Nausea And Vomiting    Corrected prior adverse reaction per [facility] [employer]. On 12/02/3011  patient reported N & V when she takes Crestor   Gemfibrozil  Rash, Nausea And Vomiting and Swelling   Trazodone And Nefazodone Nausea And Vomiting   Lipitor [Atorvastatin] Nausea Only    By patient report 12/02/2011. Had also been prescribed pravastatin and lovastatin by previous MD. Unclear if those caused same side effects.        Latest Ref Rng & Units 01/13/2023    6:36 AM 01/11/2023    8:38 AM 01/09/2023    5:35 AM  CBC  WBC 4.0 - 10.5 K/uL 6.7  7.4  7.4   Hemoglobin 12.0 - 15.0 g/dL 47.8  29.5  62.1   Hematocrit 36.0 - 46.0 % 33.2  34.0  33.5   Platelets 150 - 400 K/uL 232  226  207       CMP     Component Value Date/Time   NA 136 01/13/2023 0636   NA 146 (H) 05/09/2021 1342   NA 137 02/02/2012 1114   K 3.8 01/13/2023 0636   K 3.9 02/02/2012 1114   CL 99 01/13/2023 0636   CL 103 02/02/2012 1114   CO2 25 01/13/2023 0636   CO2 24 02/02/2012 1114   GLUCOSE 148 (H) 01/13/2023 0636   GLUCOSE 226 (H) 02/02/2012 1114   BUN 21 01/13/2023 0636   BUN 14 05/09/2021 1342   BUN 14 02/02/2012 1114   CREATININE 0.92 01/13/2023 0636   CREATININE 0.68 02/02/2012 1114   CALCIUM 8.7 (L) 01/13/2023 0636   CALCIUM 8.6 02/02/2012 1114   PROT 6.8 01/13/2023 0636   PROT 7.4 05/09/2021 1342   ALBUMIN 3.8 01/13/2023 0636   ALBUMIN 4.6 05/09/2021 1342   AST 28 01/13/2023 0636   ALT 15 01/13/2023 0636   ALKPHOS 74 01/13/2023 0636   BILITOT 0.5 01/13/2023 0636   BILITOT 0.4 05/09/2021 1342   EGFR 101 05/09/2021 1342   GFRNONAA >60 01/13/2023 0636  GFRNONAA >60 02/02/2012 1114     No results found.     Assessment & Plan:   1. Bilateral carotid artery stenosis Recommend:  Given the patient's asymptomatic subcritical stenosis no further invasive testing or surgery at this time.  Duplex ultrasound shows  <40% stenosis bilaterally.  Continue antiplatelet therapy as prescribed Continue management of CAD, HTN and Hyperlipidemia Healthy heart diet,  encouraged exercise at least 4 times per week  Follow up in 12 months with duplex ultrasound and physical exam   2. Type 2 diabetes mellitus with diabetic microalbuminuria, without long-term current use of insulin  (HCC) Continue hypoglycemic medications as already ordered, these medications have been reviewed and there are no changes at this time.  Hgb A1C to be monitored as already arranged by primary service  3. Vertebral artery stenosis, right Previous notes are consistent with studies that indicate occlusion of the right vertebral artery.  I have discussed with patient that once these arteries are occluded we are unable to intervene due to the risk of causing significant injury.  Patient will continue to notice fatigue with plan as noted above.   Current Outpatient Medications on File Prior to Visit  Medication Sig Dispense Refill   albuterol  (PROVENTIL ) (2.5 MG/3ML) 0.083% nebulizer solution Take 2.5 mg by nebulization every 6 (six) hours.     aspirin  EC 81 MG tablet Take 1 tablet (81 mg total) by mouth daily. Swallow whole. 30 tablet 12   busPIRone  (BUSPAR ) 5 MG tablet Take 5 mg by mouth 3 (three) times daily.     Cholecalciferol (VITAMIN D) 50 MCG (2000 UT) CAPS Take 1,000 Units by mouth daily.     clopidogrel  (PLAVIX ) 75 MG tablet Take 1 tablet (75 mg total) by mouth daily. 90 tablet 4   dapagliflozin  propanediol (FARXIGA ) 10 MG TABS tablet Take 1 tablet (10 mg total) by mouth daily before breakfast. 30 tablet 3   diclofenac  Sodium (VOLTAREN ) 1 % GEL Apply 4 g topically 4 (four) times daily. (Patient taking differently: Apply 2 g topically every 6 (six) hours as needed (pain). Apply to back of neck, not to exceed 16 grams per day.) 350 g 3   doxycycline  (MONODOX ) 100 MG capsule Take 100 mg by mouth 2 (two) times daily.     econazole nitrate  1 % cream Apply 1 Application topically 2 (two) times daily as needed.     ezetimibe  (ZETIA ) 10 MG tablet Take 10 mg by mouth daily.     Fluticasone -Umeclidin-Vilant (TRELEGY ELLIPTA ) 200-62.5-25 MCG/ACT AEPB Inhale 1 puff into the lungs daily. 30 each 12   gabapentin  (NEURONTIN ) 300 MG capsule Take 300 mg by mouth 2 (two) times daily.     hydrocortisone cream 1 % Apply 1 Application topically every 6 (six) hours as needed for itching.     ipratropium-albuterol  (DUONEB) 0.5-2.5 (3) MG/3ML SOLN      ketoconazole  (NIZORAL ) 2 % shampoo Apply 1 Application topically 2 (two) times a week. Apply to scalp twice a week during shower on Wednesday and Saturday. Lather and allow to sit for 5 minutes before rinsing.     levothyroxine  (SYNTHROID ) 50 MCG tablet Take 50 mcg by mouth daily before breakfast.     loperamide (IMODIUM) 2 MG capsule Take by mouth as needed for diarrhea or loose stools.     Magnesium 400 MG TABS Take 1 tablet by mouth 2 (two) times daily.     metFORMIN  (GLUCOPHAGE ) 500 MG tablet Take 500 mg by mouth 2 (two) times daily  with a meal.     metoprolol  succinate (TOPROL  XL) 100 MG 24 hr tablet Take 1 tablet (100 mg total) by mouth daily. Take with or immediately following a meal. 30 tablet 11   miconazole (MICOTIN) 2 % powder Apply 1 Application topically daily. Apply to affected areas on abdomen daily.     omeprazole  (PRILOSEC) 40 MG capsule Take 1 capsule (40 mg total) by mouth 2 (two) times daily. Take in the morning and at dinnertime. 120 capsule 0   ondansetron  (ZOFRAN ) 4 MG tablet Take 4 mg by mouth every 6 (six) hours as needed for nausea.     oxyCODONE -acetaminophen  (PERCOCET) 10-325 MG tablet Take 1 tablet by mouth 4 (four) times daily as needed.     predniSONE  (DELTASONE ) 20 MG tablet Take 20 mg by mouth daily.     ranolazine  (RANEXA ) 500 MG 12 hr tablet Take 1 tablet (500 mg total) by mouth 2 (two) times daily. 60 tablet 2   senna (SENOKOT) 8.6 MG TABS tablet Take 2 tablets by  mouth daily.     sertraline  (ZOLOFT ) 50 MG tablet Take 1 tablet (50 mg total) by mouth daily. 90 tablet 4   SOLU-MEDROL , PF, 40 MG injection ACT-O-VIAL      vitamin B-12 (CYANOCOBALAMIN ) 1000 MCG tablet Take 1 tablet (1,000 mcg total) by mouth daily. 30 tablet 0   white petrolatum ointment Apply 1 application  topically daily. After washing with soap and water, apply petrolatum to left lateral back (biopsy site) once a day and cover with Band-Aid.     zaleplon  (SONATA ) 5 MG capsule Take 5 mg by mouth at bedtime.     No current facility-administered medications on file prior to visit.    There are no Patient Instructions on file for this visit. No follow-ups on file.   Keatyn Luck E Favio Moder, NP

## 2023-05-28 DIAGNOSIS — R600 Localized edema: Secondary | ICD-10-CM | POA: Diagnosis not present

## 2023-05-30 DIAGNOSIS — I10 Essential (primary) hypertension: Secondary | ICD-10-CM | POA: Diagnosis not present

## 2023-06-04 DIAGNOSIS — C3410 Malignant neoplasm of upper lobe, unspecified bronchus or lung: Secondary | ICD-10-CM | POA: Diagnosis not present

## 2023-06-04 DIAGNOSIS — E039 Hypothyroidism, unspecified: Secondary | ICD-10-CM | POA: Diagnosis not present

## 2023-06-05 DIAGNOSIS — E039 Hypothyroidism, unspecified: Secondary | ICD-10-CM | POA: Diagnosis not present

## 2023-06-05 DIAGNOSIS — C3490 Malignant neoplasm of unspecified part of unspecified bronchus or lung: Secondary | ICD-10-CM | POA: Diagnosis not present

## 2023-06-05 DIAGNOSIS — L0390 Cellulitis, unspecified: Secondary | ICD-10-CM | POA: Diagnosis not present

## 2023-06-05 DIAGNOSIS — M7989 Other specified soft tissue disorders: Secondary | ICD-10-CM | POA: Diagnosis not present

## 2023-06-05 DIAGNOSIS — R600 Localized edema: Secondary | ICD-10-CM | POA: Diagnosis not present

## 2023-06-06 DIAGNOSIS — M19031 Primary osteoarthritis, right wrist: Secondary | ICD-10-CM | POA: Diagnosis not present

## 2023-06-06 DIAGNOSIS — M19021 Primary osteoarthritis, right elbow: Secondary | ICD-10-CM | POA: Diagnosis not present

## 2023-06-09 DIAGNOSIS — M7989 Other specified soft tissue disorders: Secondary | ICD-10-CM | POA: Diagnosis not present

## 2023-06-09 DIAGNOSIS — L0390 Cellulitis, unspecified: Secondary | ICD-10-CM | POA: Diagnosis not present

## 2023-06-09 DIAGNOSIS — E119 Type 2 diabetes mellitus without complications: Secondary | ICD-10-CM | POA: Diagnosis not present

## 2023-06-11 DIAGNOSIS — E119 Type 2 diabetes mellitus without complications: Secondary | ICD-10-CM | POA: Diagnosis not present

## 2023-06-11 DIAGNOSIS — E785 Hyperlipidemia, unspecified: Secondary | ICD-10-CM | POA: Diagnosis not present

## 2023-06-11 DIAGNOSIS — I2510 Atherosclerotic heart disease of native coronary artery without angina pectoris: Secondary | ICD-10-CM | POA: Diagnosis not present

## 2023-06-11 DIAGNOSIS — J449 Chronic obstructive pulmonary disease, unspecified: Secondary | ICD-10-CM | POA: Diagnosis not present

## 2023-06-11 DIAGNOSIS — E039 Hypothyroidism, unspecified: Secondary | ICD-10-CM | POA: Diagnosis not present

## 2023-06-11 DIAGNOSIS — E559 Vitamin D deficiency, unspecified: Secondary | ICD-10-CM | POA: Diagnosis not present

## 2023-06-13 ENCOUNTER — Ambulatory Visit: Admitting: Student in an Organized Health Care Education/Training Program

## 2023-06-13 ENCOUNTER — Encounter: Payer: Self-pay | Admitting: Student in an Organized Health Care Education/Training Program

## 2023-06-13 VITALS — BP 110/68 | HR 115 | Temp 97.1°F | Ht <= 58 in | Wt 189.0 lb

## 2023-06-13 DIAGNOSIS — R2681 Unsteadiness on feet: Secondary | ICD-10-CM | POA: Diagnosis not present

## 2023-06-13 DIAGNOSIS — G4733 Obstructive sleep apnea (adult) (pediatric): Secondary | ICD-10-CM | POA: Diagnosis not present

## 2023-06-13 DIAGNOSIS — J42 Unspecified chronic bronchitis: Secondary | ICD-10-CM | POA: Diagnosis not present

## 2023-06-13 DIAGNOSIS — F17210 Nicotine dependence, cigarettes, uncomplicated: Secondary | ICD-10-CM

## 2023-06-13 DIAGNOSIS — M7989 Other specified soft tissue disorders: Secondary | ICD-10-CM

## 2023-06-13 DIAGNOSIS — J4540 Moderate persistent asthma, uncomplicated: Secondary | ICD-10-CM

## 2023-06-13 DIAGNOSIS — C3491 Malignant neoplasm of unspecified part of right bronchus or lung: Secondary | ICD-10-CM

## 2023-06-13 DIAGNOSIS — M6281 Muscle weakness (generalized): Secondary | ICD-10-CM | POA: Diagnosis not present

## 2023-06-13 NOTE — Progress Notes (Signed)
 Assessment & Plan:   #Moderate persistent asthma without complication (Primary) #Chronic bronchitis, unspecified chronic bronchitis type (HCC) #OSA (obstructive sleep apnea) #Small Cell Lung Cancer s/p chemoradiation   Presenting for follow up of cough/wheeze in the setting of history of small cell lung cancer treated with chemoradiation as well as history of COPD and asthma. PFT's show findings consistent with PRiSM  (preserved ratio, impaired spirometry) with no reversibility of obstruction following albuterol .   I suspect that the patient has COPD/asthma overlap syndrome compounded by the effects of her malignancy as well as radiation changes. With initiation of Trelegy Ellipta , she reports feeling much better with improvement in symptoms overall. Her max eosinophil count was 400 in the past and I suspect she would benefit from being continued on inhaled corticosteroids.  She is also maintained on montelukast .   She has had a recent chest x-ray that shows fullness in the right hilar area which I suspect is secondary to the radiation changes from her previous small cell lung cancer.  We will obtain a repeat CT scan of the chest with contrast this time to ensure stability and no change as compared to the CT scan from December 2024.   Finally, given her concern for sleep apnea, body habitus, and snoring, sleep study was obtained and was positive. We initiated CPAP but she did not tolerate it well. Discussed at length with patient, and offered referral to sleep specialist.  We will schedule her for an evaluation with Dr. Kieran Riley in our clinic.  - CT CHEST W CONTRAST; Future  #Swelling of right upper extremity  Reporting symptoms of right upper extremity redness and swelling I will workup with a duplex ultrasound of the right upper extremity to rule out DVT.  I will also refer her back to vascular surgery where she has been followed closely.  - US  UPPER EXTREMITY DUPLEX RIGHT (NON-WBI);  Future   Return in about 1 year (around 06/12/2024).  I spent 30 minutes caring for this patient today, including preparing to see the patient, obtaining a medical history , reviewing a separately obtained history, performing a medically appropriate examination and/or evaluation, counseling and educating the patient/family/caregiver, ordering medications, tests, or procedures, documenting clinical information in the electronic health record, and independently interpreting results (not separately reported/billed) and communicating results to the patient/family/caregiver  [name], MD [facility]   End of visit medications:  No orders of the defined types were placed in this encounter.    Current Outpatient Medications:    albuterol  (PROVENTIL ) (2.5 MG/3ML) 0.083% nebulizer solution, Take 2.5 mg by nebulization every 6 (six) hours., Disp: , Rfl:    aspirin  EC 81 MG tablet, Take 1 tablet (81 mg total) by mouth daily. Swallow whole., Disp: 30 tablet, Rfl: 12   busPIRone  (BUSPAR ) 5 MG tablet, Take 5 mg by mouth 3 (three) times daily., Disp: , Rfl:    Cholecalciferol (VITAMIN D) 50 MCG (2000 UT) CAPS, Take 1,000 Units by mouth daily., Disp: , Rfl:    diclofenac  Sodium (VOLTAREN ) 1 % GEL, Apply 4 g topically 4 (four) times daily. (Patient taking differently: Apply 2 g topically every 6 (six) hours as needed (pain). Apply to back of neck, not to exceed 16 grams per day.), Disp: 350 g, Rfl: 3   econazole nitrate 1 % cream, Apply 1 Application topically 2 (two) times daily as needed., Disp: , Rfl:    ezetimibe  (ZETIA ) 10 MG tablet, Take 10 mg by mouth daily., Disp: , Rfl:  Fluticasone -Umeclidin-Vilant (TRELEGY ELLIPTA ) 200-62.5-25 MCG/ACT AEPB, Inhale 1 puff into the lungs daily., Disp: 30 each, Rfl: 12   gabapentin  (NEURONTIN ) 300 MG capsule, Take 300 mg by mouth 2 (two) times daily., Disp: , Rfl:    hydrocortisone cream 1 %, Apply 1 Application topically every 6 (six)  hours as needed for itching., Disp: , Rfl:    ketoconazole  (NIZORAL ) 2 % shampoo, Apply 1 Application topically 2 (two) times a week. Apply to scalp twice a week during shower on Wednesday and Saturday. Lather and allow to sit for 5 minutes before rinsing., Disp: , Rfl:    levothyroxine  (SYNTHROID ) 88 MCG tablet, Take 88 mcg by mouth daily., Disp: , Rfl:    loperamide (IMODIUM) 2 MG capsule, Take by mouth as needed for diarrhea or loose stools., Disp: , Rfl:    Magnesium 400 MG TABS, Take 1 tablet by mouth 2 (two) times daily., Disp: , Rfl:    metFORMIN  (GLUCOPHAGE ) 500 MG tablet, Take 500 mg by mouth 2 (two) times daily with a meal., Disp: , Rfl:    metoprolol  succinate (TOPROL  XL) 100 MG 24 hr tablet, Take 1 tablet (100 mg total) by mouth daily. Take with or immediately following a meal., Disp: 30 tablet, Rfl: 11   omeprazole  (PRILOSEC) 40 MG capsule, Take 1 capsule (40 mg total) by mouth 2 (two) times daily. Take in the morning and at dinnertime., Disp: 120 capsule, Rfl: 0   ondansetron  (ZOFRAN ) 4 MG tablet, Take 4 mg by mouth every 6 (six) hours as needed for nausea., Disp: , Rfl:    oxyCODONE -acetaminophen  (PERCOCET) 10-325 MG tablet, Take 1 tablet by mouth 4 (four) times daily as needed., Disp: , Rfl:    senna (SENOKOT) 8.6 MG TABS tablet, Take 2 tablets by mouth daily., Disp: , Rfl:    sertraline  (ZOLOFT ) 50 MG tablet, Take 1 tablet (50 mg total) by mouth daily., Disp: 90 tablet, Rfl: 4   vitamin B-12 (CYANOCOBALAMIN ) 1000 MCG tablet, Take 1 tablet (1,000 mcg total) by mouth daily., Disp: 30 tablet, Rfl: 0   zaleplon  (SONATA ) 5 MG capsule, Take 5 mg by mouth at bedtime., Disp: , Rfl:    clopidogrel  (PLAVIX ) 75 MG tablet, Take 1 tablet (75 mg total) by mouth daily. (Patient not taking: Reported on 06/13/2023), Disp: 90 tablet, Rfl: 4   dapagliflozin  propanediol (FARXIGA ) 10 MG TABS tablet, Take 1 tablet (10 mg total) by mouth daily before breakfast. (Patient not taking: Reported on 06/13/2023),  Disp: 30 tablet, Rfl: 3   doxycycline  (MONODOX ) 100 MG capsule, Take 100 mg by mouth 2 (two) times daily. (Patient not taking: Reported on 06/13/2023), Disp: , Rfl:    ipratropium-albuterol  (DUONEB) 0.5-2.5 (3) MG/3ML SOLN, , Disp: , Rfl:    miconazole (MICOTIN) 2 % powder, Apply 1 Application topically daily. Apply to affected areas on abdomen daily. (Patient not taking: Reported on 06/13/2023), Disp: , Rfl:    predniSONE  (DELTASONE ) 20 MG tablet, Take 20 mg by mouth daily. (Patient not taking: Reported on 06/13/2023), Disp: , Rfl:    ranolazine  (RANEXA ) 500 MG 12 hr tablet, Take 1 tablet (500 mg total) by mouth 2 (two) times daily. (Patient not taking: Reported on 06/13/2023), Disp: 60 tablet, Rfl: 2   SOLU-MEDROL , PF, 40 MG injection ACT-O-VIAL, , Disp: , Rfl:    white petrolatum ointment, Apply 1 application  topically daily. After washing with soap and water, apply petrolatum to left lateral back (biopsy site) once a day and cover with Band-Aid. (Patient not  taking: Reported on 06/13/2023), Disp: , Rfl:    Subjective:   PATIENT ID: [facility] GENDER: female DOB: 1955/10/31, MRN: [id number]  Chief Complaint  Patient presents with   Follow-up    SOB, wheezing and cough.    HPI  Patient is a 68 year old female presenting to clinic for follow up.  She was last seen in clinic in March 2025 at which point we continued Trelegy Ellipta  and referred her to sleep medicine given her inability to tolerate CPAP.  She is at baseline with continued symptoms of cough as well as exertional dyspnea that are unchanged compared to prior.  She did get a chest x-ray at her skilled nursing facility which was noted for a soft tissue fullness in the right hilar area with increased pulmonary markings in the right lung for which an acute visit was scheduled.  Patient feels that her respiratory symptoms are at baseline and unchanged since her radiation therapy.  She continues to cough and has rhonchorous sounds.   She is endorsing right upper extremity swelling as well as redness which has been ongoing for a couple of weeks.  She does not have any fevers or chills, denies any night sweats, denies any weight loss.   She's not had any exacerbations since our last visit. She did get hospitalized in December of 2024 following a fall and was noted to have NSTEMI s/p LHC with angioplasty.   She has a history of small cell lung cancer previously treated with chemoradiation, now in remission, previously followed by Dr. Adrian Alba from oncology.  Given stability on her CT scans, she was discharged from oncology clinic.  She was initially referred to us  for the evaluation of cough and shortness of breath that are chronic but persistent.  She was previously told she has asthma as well as COPD, and started on inhalers.  She is currently on Trelegy, 1 puff once daily, with good results.  She has not had her cough since her diagnosis with malignancy and radiation therapy.  Patient was told she had asthma growing up and has had multiple inhalers over the years.  She started smoking at a very young age (82) and quit in 2013. Patient currently resides at a nursing facility. She is a non-smoker. She does not have any current exposures.  Ancillary information including prior medications, full medical/surgical/family/social histories, and PFTs (when available) are listed below and have been reviewed.   Review of Systems  Constitutional:  Negative for chills, fever and weight loss.  Respiratory:  Positive for cough and shortness of breath. Negative for wheezing.   Cardiovascular:  Negative for chest pain.     Objective:   Vitals:   06/13/23 1016  BP: 110/68  Pulse: (!) 115  Temp: (!) 97.1 F (36.2 C)  SpO2: 90%  Weight: 189 lb (85.7 kg)  Height: 4\' 9"  (1.448 m)   90% on RA  BMI Readings from Last 3 Encounters:  06/13/23 40.90 kg/m  05/21/23 40.47 kg/m  04/10/23 40.64 kg/m   Wt Readings from Last 3 Encounters:   06/13/23 189 lb (85.7 kg)  05/21/23 187 lb (84.8 kg)  04/10/23 187 lb 12.8 oz (85.2 kg)    Physical Exam Constitutional:      General: She is not in acute distress.    Appearance: Normal appearance. She is not ill-appearing.  Cardiovascular:     Rate and Rhythm: Normal rate and regular rhythm.     Pulses: Normal pulses.     Heart sounds:  Normal heart sounds.  Pulmonary:     Effort: Pulmonary effort is normal. No respiratory distress.     Breath sounds: Rhonchi present. No wheezing.  Abdominal:     Palpations: Abdomen is soft.  Neurological:     Mental Status: She is alert. Mental status is at baseline.       Ancillary Information    Past Medical History:  Diagnosis Date   Allergy    Anemia    Anxiety    Arthritis    Asthma    Blood transfusion without reported diagnosis    C. difficile diarrhea 04/07/2016   CAP (community acquired pneumonia) 03/21/2015   Combined forms of age-related cataract of both eyes 05/06/2016   COPD (chronic obstructive pulmonary disease) (HCC)    CVA (cerebral vascular accident) (HCC) 06/17/2018   Diabetes mellitus without complication (HCC)    Displacement of lumbar intervertebral disc without myelopathy    Emphysema of lung (HCC)    GERD (gastroesophageal reflux disease)    Glaucoma    History of chicken pox    Hyperlipidemia    Hypertension    NSTEMI (non-ST elevated myocardial infarction) (HCC) 01/07/2023   Pneumonia 03/29/2015   Sepsis (HCC) 03/17/2016   Shortness of breath    Small cell lung cancer (HCC)      Family History  Problem Relation Age of Onset   Heart failure Mother    Heart disease Mother    Stroke Mother    Heart disease Brother    COPD Brother    Cancer Maternal Aunt        Breast Cancer     Past Surgical History:  Procedure Laterality Date   BALLOON DILATION  11/20/2022   Procedure: BALLOON DILATION;  Surgeon: Corky Diener, Alphonsus Jeans, MD;  Location: [facility] ENDOSCOPY;  Service: Gastroenterology;;   BIOPSY   11/20/2022   Procedure: BIOPSY;  Surgeon: Toledo, Alphonsus Jeans, MD;  Location: [facility] ENDOSCOPY;  Service: Gastroenterology;;   CESAREAN SECTION     CORONARY STENT INTERVENTION N/A 01/08/2023   Procedure: CORONARY STENT INTERVENTION;  Surgeon: Antonette Batters, MD;  Location: [facility] INVASIVE CV LAB;  Service: Cardiovascular;  Laterality: N/A;   ESOPHAGOGASTRODUODENOSCOPY N/A 05/17/2022   Procedure: ESOPHAGOGASTRODUODENOSCOPY (EGD);  Surgeon: Marnee Sink, MD;  Location: [facility] ENDOSCOPY;  Service: Endoscopy;  Laterality: N/A;   ESOPHAGOGASTRODUODENOSCOPY N/A 07/10/2022   Procedure: ESOPHAGOGASTRODUODENOSCOPY (EGD);  Surgeon: Toledo, Alphonsus Jeans, MD;  Location: [facility] ENDOSCOPY;  Service: Gastroenterology;  Laterality: N/A;   ESOPHAGOGASTRODUODENOSCOPY (EGD) WITH PROPOFOL  N/A 02/08/2022   Procedure: ESOPHAGOGASTRODUODENOSCOPY (EGD) WITH PROPOFOL ;  Surgeon: Luke Salaam, MD;  Location: [facility] ENDOSCOPY;  Service: Gastroenterology;  Laterality: N/A;   ESOPHAGOGASTRODUODENOSCOPY (EGD) WITH PROPOFOL  N/A 11/20/2022   Procedure: ESOPHAGOGASTRODUODENOSCOPY (EGD) WITH PROPOFOL ;  Surgeon: Toledo, Alphonsus Jeans, MD;  Location: [facility] ENDOSCOPY;  Service: Gastroenterology;  Laterality: N/A;   ESOPHAGOGASTRODUODENOSCOPY (EGD) WITH PROPOFOL  N/A 12/25/2022   Procedure: ESOPHAGOGASTRODUODENOSCOPY (EGD) WITH PROPOFOL ;  Surgeon: Selena Daily, MD;  Location: [facility] ENDOSCOPY;  Service: Gastroenterology;  Laterality: N/A;   LEFT HEART CATH AND CORONARY ANGIOGRAPHY N/A 01/08/2023   Procedure: LEFT HEART CATH AND CORONARY ANGIOGRAPHY;  Surgeon: Cherrie Cornwall, MD;  Location: [facility] INVASIVE CV LAB;  Service: Cardiovascular;  Laterality: N/A;   TUBAL LIGATION     UPPER GI ENDOSCOPY      Social History   Socioeconomic History   Marital status: Widowed    Spouse name: Not on file   Number of children: 2   Years of education: Not on file  Highest education level: 10th grade  Occupational History   Occupation: disable  Tobacco  Use   Smoking status: Former    Current packs/day: 0.00    Average packs/day: 0.3 packs/day for 45.0 years (11.3 ttl pk-yrs)    Types: Cigarettes    Start date: 03/18/1971    Quit date: 03/17/2016    Years since quitting: 7.2   Smokeless tobacco: Never   Tobacco comments:    pt states she quit in 2016-2017  Vaping Use   Vaping status: Never Used  Substance and Sexual Activity   Alcohol use: No    Alcohol/week: 0.0 standard drinks of alcohol   Drug use: No   Sexual activity: Not Currently    Birth control/protection: Abstinence  Other Topics Concern   Not on file  Social History Narrative   Not on file   Social Drivers of Health   Financial Resource Strain: Low Risk  (09/05/2021)   Overall Financial Resource Strain (CARDIA)    Difficulty of Paying Living Expenses: Not hard at all  Food Insecurity: No Food Insecurity (01/08/2023)   Hunger Vital Sign    Worried About Running Out of Food in the Last Year: Never true    Ran Out of Food in the Last Year: Never true  Transportation Needs: No Transportation Needs (01/08/2023)   PRAPARE - Administrator, Civil Service (Medical): No    Lack of Transportation (Non-Medical): No  Physical Activity: Inactive (09/05/2021)   Exercise Vital Sign    Days of Exercise per Week: 0 days    Minutes of Exercise per Session: 0 min  Stress: Stress Concern Present (09/05/2021)   [employer] of Occupational Health - Occupational Stress Questionnaire    Feeling of Stress : Very much  Social Connections: Unknown (06/01/2021)   Received from [facility]   Social Network    Social Network: Not on file  Intimate Partner Violence: Not At Risk (01/08/2023)   Humiliation, Afraid, Rape, and Kick questionnaire    Fear of Current or Ex-Partner: No    Emotionally Abused: No    Physically Abused: No    Sexually Abused: No     Allergies  Allergen Reactions   Other Anaphylaxis   Penicillins Anaphylaxis, Rash and Other (See  Comments)    Reaction:  Tongue swelling  Has patient had a PCN reaction causing immediate rash, facial/tongue/throat swelling, SOB or lightheadedness with hypotension:  Yes   Has patient had a PCN reaction causing severe rash involving mucus membranes or skin necrosis: No Has patient had a PCN reaction that required hospitalization No Has patient had a PCN reaction occurring within the last 10 years: No If all of the above answers are "NO", then may proceed with Cephalosporin use.   Yellow Jacket Venom [Bee Venom] Anaphylaxis   Codeine Hives   Crestor [Rosuvastatin] Nausea And Vomiting    Corrected prior adverse reaction per [facility] [employer]. On 12/02/3011  patient reported N & V when she takes Crestor   Gemfibrozil  Rash, Nausea And Vomiting and Swelling   Trazodone And Nefazodone Nausea And Vomiting   Lipitor [Atorvastatin] Nausea Only    By patient report 12/02/2011. Had also been prescribed pravastatin and lovastatin by previous MD. Unclear if those caused same side effects.      CBC    Component Value Date/Time   WBC 6.7 01/13/2023 0636   RBC 3.95 01/13/2023 0636   HGB 10.4 (L) 01/13/2023 0636   HGB 12.1 05/09/2021 1342  HCT 33.2 (L) 01/13/2023 0636   HCT 35.1 05/09/2021 1342   PLT 232 01/13/2023 0636   PLT 325 05/09/2021 1342   MCV 84.1 01/13/2023 0636   MCV 106 (H) 05/09/2021 1342   MCV 96 02/02/2012 1114   MCH 26.3 01/13/2023 0636   MCHC 31.3 01/13/2023 0636   RDW 14.3 01/13/2023 0636   RDW 16.9 (H) 05/09/2021 1342   RDW 14.2 02/02/2012 1114   LYMPHSABS 1.3 01/11/2023 0838   LYMPHSABS 1.1 05/09/2021 1342   MONOABS 0.4 01/11/2023 0838   EOSABS 0.4 01/11/2023 0838   EOSABS 0.4 05/09/2021 1342   BASOSABS 0.0 01/11/2023 0838   BASOSABS 0.0 05/09/2021 1342    Pulmonary Functions Testing Results:    Latest Ref Rng & Units 10/22/2022    2:37 PM  PFT Results  FVC-Pre L 1.55   FVC-Predicted Pre % 66   FVC-Post L 1.63   FVC-Predicted Post % 70   Pre FEV1/FVC % % 75    Post FEV1/FCV % % 72   FEV1-Pre L 1.16   FEV1-Predicted Pre % 65   FEV1-Post L 1.18   DLCO uncorrected ml/min/mmHg 9.36   DLCO UNC% % 60   DLVA Predicted % 79   TLC L 3.49   TLC % Predicted % 87   RV % Predicted % 104     Outpatient Medications Prior to Visit  Medication Sig Dispense Refill   albuterol  (PROVENTIL ) (2.5 MG/3ML) 0.083% nebulizer solution Take 2.5 mg by nebulization every 6 (six) hours.     aspirin  EC 81 MG tablet Take 1 tablet (81 mg total) by mouth daily. Swallow whole. 30 tablet 12   busPIRone  (BUSPAR ) 5 MG tablet Take 5 mg by mouth 3 (three) times daily.     Cholecalciferol (VITAMIN D) 50 MCG (2000 UT) CAPS Take 1,000 Units by mouth daily.     diclofenac  Sodium (VOLTAREN ) 1 % GEL Apply 4 g topically 4 (four) times daily. (Patient taking differently: Apply 2 g topically every 6 (six) hours as needed (pain). Apply to back of neck, not to exceed 16 grams per day.) 350 g 3   econazole nitrate 1 % cream Apply 1 Application topically 2 (two) times daily as needed.     ezetimibe  (ZETIA ) 10 MG tablet Take 10 mg by mouth daily.     Fluticasone -Umeclidin-Vilant (TRELEGY ELLIPTA ) 200-62.5-25 MCG/ACT AEPB Inhale 1 puff into the lungs daily. 30 each 12   gabapentin  (NEURONTIN ) 300 MG capsule Take 300 mg by mouth 2 (two) times daily.     hydrocortisone cream 1 % Apply 1 Application topically every 6 (six) hours as needed for itching.     ketoconazole  (NIZORAL ) 2 % shampoo Apply 1 Application topically 2 (two) times a week. Apply to scalp twice a week during shower on Wednesday and Saturday. Lather and allow to sit for 5 minutes before rinsing.     levothyroxine  (SYNTHROID ) 88 MCG tablet Take 88 mcg by mouth daily.     loperamide (IMODIUM) 2 MG capsule Take by mouth as needed for diarrhea or loose stools.     Magnesium 400 MG TABS Take 1 tablet by mouth 2 (two) times daily.     metFORMIN  (GLUCOPHAGE ) 500 MG tablet Take 500 mg by mouth 2 (two) times daily with a meal.     metoprolol   succinate (TOPROL  XL) 100 MG 24 hr tablet Take 1 tablet (100 mg total) by mouth daily. Take with or immediately following a meal. 30 tablet 11   omeprazole  (  PRILOSEC) 40 MG capsule Take 1 capsule (40 mg total) by mouth 2 (two) times daily. Take in the morning and at dinnertime. 120 capsule 0   ondansetron  (ZOFRAN ) 4 MG tablet Take 4 mg by mouth every 6 (six) hours as needed for nausea.     oxyCODONE -acetaminophen  (PERCOCET) 10-325 MG tablet Take 1 tablet by mouth 4 (four) times daily as needed.     senna (SENOKOT) 8.6 MG TABS tablet Take 2 tablets by mouth daily.     sertraline  (ZOLOFT ) 50 MG tablet Take 1 tablet (50 mg total) by mouth daily. 90 tablet 4   vitamin B-12 (CYANOCOBALAMIN ) 1000 MCG tablet Take 1 tablet (1,000 mcg total) by mouth daily. 30 tablet 0   zaleplon  (SONATA ) 5 MG capsule Take 5 mg by mouth at bedtime.     clopidogrel  (PLAVIX ) 75 MG tablet Take 1 tablet (75 mg total) by mouth daily. (Patient not taking: Reported on 06/13/2023) 90 tablet 4   dapagliflozin  propanediol (FARXIGA ) 10 MG TABS tablet Take 1 tablet (10 mg total) by mouth daily before breakfast. (Patient not taking: Reported on 06/13/2023) 30 tablet 3   doxycycline  (MONODOX ) 100 MG capsule Take 100 mg by mouth 2 (two) times daily. (Patient not taking: Reported on 06/13/2023)     ipratropium-albuterol  (DUONEB) 0.5-2.5 (3) MG/3ML SOLN  (Patient not taking: Reported on 06/13/2023)     miconazole (MICOTIN) 2 % powder Apply 1 Application topically daily. Apply to affected areas on abdomen daily. (Patient not taking: Reported on 06/13/2023)     predniSONE  (DELTASONE ) 20 MG tablet Take 20 mg by mouth daily. (Patient not taking: Reported on 06/13/2023)     ranolazine  (RANEXA ) 500 MG 12 hr tablet Take 1 tablet (500 mg total) by mouth 2 (two) times daily. (Patient not taking: Reported on 06/13/2023) 60 tablet 2   SOLU-MEDROL , PF, 40 MG injection ACT-O-VIAL  (Patient not taking: Reported on 06/13/2023)     white petrolatum ointment Apply 1  application  topically daily. After washing with soap and water, apply petrolatum to left lateral back (biopsy site) once a day and cover with Band-Aid. (Patient not taking: Reported on 06/13/2023)     levothyroxine  (SYNTHROID ) 50 MCG tablet Take 50 mcg by mouth daily before breakfast. (Patient not taking: Reported on 06/13/2023)     No facility-administered medications prior to visit.

## 2023-06-16 DIAGNOSIS — M6281 Muscle weakness (generalized): Secondary | ICD-10-CM | POA: Diagnosis not present

## 2023-06-16 DIAGNOSIS — R2681 Unsteadiness on feet: Secondary | ICD-10-CM | POA: Diagnosis not present

## 2023-06-17 ENCOUNTER — Ambulatory Visit: Admitting: Sleep Medicine

## 2023-06-17 DIAGNOSIS — M6281 Muscle weakness (generalized): Secondary | ICD-10-CM | POA: Diagnosis not present

## 2023-06-17 DIAGNOSIS — R2681 Unsteadiness on feet: Secondary | ICD-10-CM | POA: Diagnosis not present

## 2023-06-18 DIAGNOSIS — R2681 Unsteadiness on feet: Secondary | ICD-10-CM | POA: Diagnosis not present

## 2023-06-18 DIAGNOSIS — M6281 Muscle weakness (generalized): Secondary | ICD-10-CM | POA: Diagnosis not present

## 2023-06-19 DIAGNOSIS — E119 Type 2 diabetes mellitus without complications: Secondary | ICD-10-CM | POA: Diagnosis not present

## 2023-06-20 DIAGNOSIS — M6281 Muscle weakness (generalized): Secondary | ICD-10-CM | POA: Diagnosis not present

## 2023-06-20 DIAGNOSIS — R2681 Unsteadiness on feet: Secondary | ICD-10-CM | POA: Diagnosis not present

## 2023-06-21 DIAGNOSIS — M6281 Muscle weakness (generalized): Secondary | ICD-10-CM | POA: Diagnosis not present

## 2023-06-21 DIAGNOSIS — R2681 Unsteadiness on feet: Secondary | ICD-10-CM | POA: Diagnosis not present

## 2023-06-25 ENCOUNTER — Ambulatory Visit
Admission: RE | Admit: 2023-06-25 | Discharge: 2023-06-25 | Disposition: A | Discharge status: Home / Self Care | Source: Ambulatory Visit | Attending: Student in an Organized Health Care Education/Training Program | Admitting: Student in an Organized Health Care Education/Training Program

## 2023-06-25 DIAGNOSIS — R918 Other nonspecific abnormal finding of lung field: Secondary | ICD-10-CM | POA: Insufficient documentation

## 2023-06-25 DIAGNOSIS — R2681 Unsteadiness on feet: Secondary | ICD-10-CM | POA: Diagnosis not present

## 2023-06-25 DIAGNOSIS — C3491 Malignant neoplasm of unspecified part of right bronchus or lung: Secondary | ICD-10-CM | POA: Insufficient documentation

## 2023-06-25 DIAGNOSIS — M7989 Other specified soft tissue disorders: Secondary | ICD-10-CM | POA: Diagnosis not present

## 2023-06-25 DIAGNOSIS — C3490 Malignant neoplasm of unspecified part of unspecified bronchus or lung: Secondary | ICD-10-CM | POA: Diagnosis not present

## 2023-06-25 DIAGNOSIS — Z923 Personal history of irradiation: Secondary | ICD-10-CM | POA: Diagnosis not present

## 2023-06-25 DIAGNOSIS — M6281 Muscle weakness (generalized): Secondary | ICD-10-CM | POA: Diagnosis not present

## 2023-06-25 DIAGNOSIS — R600 Localized edema: Secondary | ICD-10-CM | POA: Diagnosis not present

## 2023-06-25 LAB — POCT I-STAT CREATININE: Creatinine, Ser: 0.6 mg/dL (ref 0.44–1.00)

## 2023-06-25 MED ORDER — IOHEXOL 300 MG/ML  SOLN
75.0000 mL | Freq: Once | INTRAMUSCULAR | Status: AC | PRN
  Administered 2023-06-25: 75 mL via INTRAVENOUS

## 2023-06-26 ENCOUNTER — Ambulatory Visit: Payer: Self-pay | Admitting: Student in an Organized Health Care Education/Training Program

## 2023-06-26 DIAGNOSIS — G8929 Other chronic pain: Secondary | ICD-10-CM | POA: Diagnosis not present

## 2023-06-26 DIAGNOSIS — K219 Gastro-esophageal reflux disease without esophagitis: Secondary | ICD-10-CM | POA: Diagnosis not present

## 2023-06-26 DIAGNOSIS — G4700 Insomnia, unspecified: Secondary | ICD-10-CM | POA: Diagnosis not present

## 2023-06-27 DIAGNOSIS — R2681 Unsteadiness on feet: Secondary | ICD-10-CM | POA: Diagnosis not present

## 2023-06-27 DIAGNOSIS — M6281 Muscle weakness (generalized): Secondary | ICD-10-CM | POA: Diagnosis not present

## 2023-06-30 DIAGNOSIS — M6281 Muscle weakness (generalized): Secondary | ICD-10-CM | POA: Diagnosis not present

## 2023-06-30 DIAGNOSIS — R2681 Unsteadiness on feet: Secondary | ICD-10-CM | POA: Diagnosis not present

## 2023-07-01 DIAGNOSIS — M6281 Muscle weakness (generalized): Secondary | ICD-10-CM | POA: Diagnosis not present

## 2023-07-01 DIAGNOSIS — R2681 Unsteadiness on feet: Secondary | ICD-10-CM | POA: Diagnosis not present

## 2023-07-02 DIAGNOSIS — M6281 Muscle weakness (generalized): Secondary | ICD-10-CM | POA: Diagnosis not present

## 2023-07-02 DIAGNOSIS — R2681 Unsteadiness on feet: Secondary | ICD-10-CM | POA: Diagnosis not present

## 2023-07-03 DIAGNOSIS — R2681 Unsteadiness on feet: Secondary | ICD-10-CM | POA: Diagnosis not present

## 2023-07-03 DIAGNOSIS — M6281 Muscle weakness (generalized): Secondary | ICD-10-CM | POA: Diagnosis not present

## 2023-07-04 DIAGNOSIS — M6281 Muscle weakness (generalized): Secondary | ICD-10-CM | POA: Diagnosis not present

## 2023-07-04 DIAGNOSIS — R2681 Unsteadiness on feet: Secondary | ICD-10-CM | POA: Diagnosis not present

## 2023-07-08 ENCOUNTER — Ambulatory Visit (INDEPENDENT_AMBULATORY_CARE_PROVIDER_SITE_OTHER): Admitting: Cardiovascular Disease

## 2023-07-08 ENCOUNTER — Encounter: Payer: Self-pay | Admitting: Cardiovascular Disease

## 2023-07-08 VITALS — BP 122/70 | HR 92 | Ht <= 58 in | Wt 186.0 lb

## 2023-07-08 DIAGNOSIS — I663 Occlusion and stenosis of cerebellar arteries: Secondary | ICD-10-CM | POA: Diagnosis not present

## 2023-07-08 DIAGNOSIS — J449 Chronic obstructive pulmonary disease, unspecified: Secondary | ICD-10-CM | POA: Diagnosis not present

## 2023-07-08 DIAGNOSIS — I6930 Unspecified sequelae of cerebral infarction: Secondary | ICD-10-CM

## 2023-07-08 DIAGNOSIS — I5033 Acute on chronic diastolic (congestive) heart failure: Secondary | ICD-10-CM

## 2023-07-08 DIAGNOSIS — M25421 Effusion, right elbow: Secondary | ICD-10-CM | POA: Diagnosis not present

## 2023-07-08 DIAGNOSIS — R0602 Shortness of breath: Secondary | ICD-10-CM | POA: Diagnosis not present

## 2023-07-08 DIAGNOSIS — I4711 Inappropriate sinus tachycardia, so stated: Secondary | ICD-10-CM

## 2023-07-08 DIAGNOSIS — I2510 Atherosclerotic heart disease of native coronary artery without angina pectoris: Secondary | ICD-10-CM | POA: Diagnosis not present

## 2023-07-08 DIAGNOSIS — R0789 Other chest pain: Secondary | ICD-10-CM | POA: Diagnosis not present

## 2023-07-08 DIAGNOSIS — Z0130 Encounter for examination of blood pressure without abnormal findings: Secondary | ICD-10-CM

## 2023-07-08 DIAGNOSIS — G4733 Obstructive sleep apnea (adult) (pediatric): Secondary | ICD-10-CM

## 2023-07-08 MED ORDER — TORSEMIDE 10 MG PO TABS: 10.0000 mg | ORAL_TABLET | Freq: Every day | ORAL | 11 refills | Status: AC

## 2023-07-08 MED ORDER — DAPAGLIFLOZIN PROPANEDIOL 10 MG PO TABS: 10.0000 mg | ORAL_TABLET | Freq: Every day | ORAL | 3 refills | Status: AC

## 2023-07-08 MED ORDER — CLOPIDOGREL BISULFATE 75 MG PO TABS
75.0000 mg | ORAL_TABLET | Freq: Every day | ORAL | 4 refills | Status: AC
Start: 2023-07-08 — End: ?

## 2023-07-08 NOTE — Progress Notes (Signed)
 Cardiology Office Note   Date:  07/08/2023   ID:  Yvette Riley, DOB 07-Apr-1955, MRN [id number]  PCP:  Adella Agee, DO  Cardiologist:  Debborah Fairly, MD      History of Present Illness: Yvette Riley is a 68 y.o. female who presents for  Chief Complaint  Patient presents with   Follow-up    3 months follow up    Has swelling of right whole arm with rash.      Past Medical History:  Diagnosis Date   Allergy    Anemia    Anxiety    Arthritis    Asthma    Blood transfusion without reported diagnosis    C. difficile diarrhea 04/07/2016   CAP (community acquired pneumonia) 03/21/2015   Combined forms of age-related cataract of both eyes 05/06/2016   COPD (chronic obstructive pulmonary disease) (HCC)    CVA (cerebral vascular accident) (HCC) 06/17/2018   Diabetes mellitus without complication (HCC)    Displacement of lumbar intervertebral disc without myelopathy    Emphysema of lung (HCC)    GERD (gastroesophageal reflux disease)    Glaucoma    History of chicken pox    Hyperlipidemia    Hypertension    NSTEMI (non-ST elevated myocardial infarction) (HCC) 01/07/2023   Pneumonia 03/29/2015   Sepsis (HCC) 03/17/2016   Shortness of breath    Small cell lung cancer (HCC)      Past Surgical History:  Procedure Laterality Date   BALLOON DILATION  11/20/2022   Procedure: BALLOON DILATION;  Surgeon: Corky Diener, Alphonsus Jeans, MD;  Location: [facility] ENDOSCOPY;  Service: Gastroenterology;;   BIOPSY  11/20/2022   Procedure: BIOPSY;  Surgeon: Toledo, Alphonsus Jeans, MD;  Location: [facility] ENDOSCOPY;  Service: Gastroenterology;;   CESAREAN SECTION     CORONARY STENT INTERVENTION N/A 01/08/2023   Procedure: CORONARY STENT INTERVENTION;  Surgeon: Antonette Batters, MD;  Location: [facility] INVASIVE CV LAB;  Service: Cardiovascular;  Laterality: N/A;   ESOPHAGOGASTRODUODENOSCOPY N/A 05/17/2022   Procedure: ESOPHAGOGASTRODUODENOSCOPY (EGD);  Surgeon: Marnee Sink, MD;  Location: [facility]  ENDOSCOPY;  Service: Endoscopy;  Laterality: N/A;   ESOPHAGOGASTRODUODENOSCOPY N/A 07/10/2022   Procedure: ESOPHAGOGASTRODUODENOSCOPY (EGD);  Surgeon: Toledo, Alphonsus Jeans, MD;  Location: [facility] ENDOSCOPY;  Service: Gastroenterology;  Laterality: N/A;   ESOPHAGOGASTRODUODENOSCOPY (EGD) WITH PROPOFOL  N/A 02/08/2022   Procedure: ESOPHAGOGASTRODUODENOSCOPY (EGD) WITH PROPOFOL ;  Surgeon: Luke Salaam, MD;  Location: [facility] ENDOSCOPY;  Service: Gastroenterology;  Laterality: N/A;   ESOPHAGOGASTRODUODENOSCOPY (EGD) WITH PROPOFOL  N/A 11/20/2022   Procedure: ESOPHAGOGASTRODUODENOSCOPY (EGD) WITH PROPOFOL ;  Surgeon: Toledo, Alphonsus Jeans, MD;  Location: [facility] ENDOSCOPY;  Service: Gastroenterology;  Laterality: N/A;   ESOPHAGOGASTRODUODENOSCOPY (EGD) WITH PROPOFOL  N/A 12/25/2022   Procedure: ESOPHAGOGASTRODUODENOSCOPY (EGD) WITH PROPOFOL ;  Surgeon: Selena Daily, MD;  Location: [facility] ENDOSCOPY;  Service: Gastroenterology;  Laterality: N/A;   LEFT HEART CATH AND CORONARY ANGIOGRAPHY N/A 01/08/2023   Procedure: LEFT HEART CATH AND CORONARY ANGIOGRAPHY;  Surgeon: Cherrie Cornwall, MD;  Location: [facility] INVASIVE CV LAB;  Service: Cardiovascular;  Laterality: N/A;   TUBAL LIGATION     UPPER GI ENDOSCOPY       Current Outpatient Medications  Medication Sig Dispense Refill   torsemide (DEMADEX) 10 MG tablet Take 1 tablet (10 mg total) by mouth daily. 30 tablet 11   albuterol  (PROVENTIL ) (2.5 MG/3ML) 0.083% nebulizer solution Take 2.5 mg by nebulization every 6 (six) hours.     aspirin  EC 81 MG tablet Take 1 tablet (81 mg total) by mouth daily. Swallow  whole. 30 tablet 12   busPIRone  (BUSPAR ) 5 MG tablet Take 5 mg by mouth 3 (three) times daily.     Cholecalciferol (VITAMIN D) 50 MCG (2000 UT) CAPS Take 1,000 Units by mouth daily.     clopidogrel  (PLAVIX ) 75 MG tablet Take 1 tablet (75 mg total) by mouth daily. 90 tablet 4   dapagliflozin  propanediol (FARXIGA ) 10 MG TABS tablet Take 1 tablet (10 mg total) by mouth daily  before breakfast. 30 tablet 3   diclofenac  Sodium (VOLTAREN ) 1 % GEL Apply 4 g topically 4 (four) times daily. (Patient taking differently: Apply 2 g topically every 6 (six) hours as needed (pain). Apply to back of neck, not to exceed 16 grams per day.) 350 g 3   doxycycline  (MONODOX ) 100 MG capsule Take 100 mg by mouth 2 (two) times daily. (Patient not taking: Reported on 06/13/2023)     econazole nitrate 1 % cream Apply 1 Application topically 2 (two) times daily as needed.     ezetimibe  (ZETIA ) 10 MG tablet Take 10 mg by mouth daily.     Fluticasone -Umeclidin-Vilant (TRELEGY ELLIPTA ) 200-62.5-25 MCG/ACT AEPB Inhale 1 puff into the lungs daily. 30 each 12   gabapentin  (NEURONTIN ) 300 MG capsule Take 300 mg by mouth 2 (two) times daily.     hydrocortisone cream 1 % Apply 1 Application topically every 6 (six) hours as needed for itching.     ipratropium-albuterol  (DUONEB) 0.5-2.5 (3) MG/3ML SOLN  (Patient not taking: Reported on 06/13/2023)     ketoconazole  (NIZORAL ) 2 % shampoo Apply 1 Application topically 2 (two) times a week. Apply to scalp twice a week during shower on Wednesday and Saturday. Lather and allow to sit for 5 minutes before rinsing.     levothyroxine  (SYNTHROID ) 88 MCG tablet Take 88 mcg by mouth daily.     loperamide (IMODIUM) 2 MG capsule Take by mouth as needed for diarrhea or loose stools.     Magnesium 400 MG TABS Take 1 tablet by mouth 2 (two) times daily.     metFORMIN  (GLUCOPHAGE ) 500 MG tablet Take 500 mg by mouth 2 (two) times daily with a meal.     metoprolol  succinate (TOPROL  XL) 100 MG 24 hr tablet Take 1 tablet (100 mg total) by mouth daily. Take with or immediately following a meal. 30 tablet 11   miconazole (MICOTIN) 2 % powder Apply 1 Application topically daily. Apply to affected areas on abdomen daily. (Patient not taking: Reported on 06/13/2023)     omeprazole  (PRILOSEC) 40 MG capsule Take 1 capsule (40 mg total) by mouth 2 (two) times daily. Take in the morning and  at dinnertime. 120 capsule 0   ondansetron  (ZOFRAN ) 4 MG tablet Take 4 mg by mouth every 6 (six) hours as needed for nausea.     oxyCODONE -acetaminophen  (PERCOCET) 10-325 MG tablet Take 1 tablet by mouth 4 (four) times daily as needed.     predniSONE  (DELTASONE ) 20 MG tablet Take 20 mg by mouth daily. (Patient not taking: Reported on 06/13/2023)     ranolazine  (RANEXA ) 500 MG 12 hr tablet Take 1 tablet (500 mg total) by mouth 2 (two) times daily. (Patient not taking: Reported on 06/13/2023) 60 tablet 2   senna (SENOKOT) 8.6 MG TABS tablet Take 2 tablets by mouth daily.     sertraline  (ZOLOFT ) 50 MG tablet Take 1 tablet (50 mg total) by mouth daily. 90 tablet 4   SOLU-MEDROL , PF, 40 MG injection ACT-O-VIAL  (Patient not taking: Reported on  06/13/2023)     vitamin B-12 (CYANOCOBALAMIN ) 1000 MCG tablet Take 1 tablet (1,000 mcg total) by mouth daily. 30 tablet 0   white petrolatum ointment Apply 1 application  topically daily. After washing with soap and water, apply petrolatum to left lateral back (biopsy site) once a day and cover with Band-Aid. (Patient not taking: Reported on 06/13/2023)     zaleplon  (SONATA ) 5 MG capsule Take 5 mg by mouth at bedtime.     No current facility-administered medications for this visit.    Allergies:   Other, Penicillins, Yellow jacket venom [bee venom], Codeine, Crestor [rosuvastatin], Gemfibrozil , Trazodone and nefazodone, and Lipitor [atorvastatin]    Social History:   reports that she quit smoking about 7 years ago. Her smoking use included cigarettes. She started smoking about 52 years ago. She has a 11.3 pack-year smoking history. She has never used smokeless tobacco. She reports that she does not drink alcohol and does not use drugs.   Family History:  family history includes COPD in her brother; Cancer in her maternal aunt; Heart disease in her brother and mother; Heart failure in her mother; Stroke in her mother.    ROS:     Review of Systems   Constitutional: Negative.   HENT: Negative.    Eyes: Negative.   Respiratory: Negative.    Gastrointestinal: Negative.   Genitourinary: Negative.   Musculoskeletal: Negative.   Skin: Negative.   Neurological: Negative.   Endo/Heme/Allergies: Negative.   Psychiatric/Behavioral: Negative.    All other systems reviewed and are negative.     All other systems are reviewed and negative.    PHYSICAL EXAM: VS:  BP 122/70   Pulse 92   Ht 4\' 9"  (1.448 m)   Wt 186 lb (84.4 kg)   SpO2 92%   BMI 40.25 kg/m  , BMI Body mass index is 40.25 kg/m. Last weight:  Wt Readings from Last 3 Encounters:  07/08/23 186 lb (84.4 kg)  06/13/23 189 lb (85.7 kg)  05/21/23 187 lb (84.8 kg)     Physical Exam Constitutional:      Appearance: Normal appearance.  Cardiovascular:     Rate and Rhythm: Normal rate and regular rhythm.     Heart sounds: Normal heart sounds.  Pulmonary:     Effort: Pulmonary effort is normal.     Breath sounds: Normal breath sounds.  Musculoskeletal:     Right lower leg: No edema.     Left lower leg: No edema.  Neurological:     Mental Status: She is alert.       EKG:   Recent Labs: 01/08/2023: TSH 7.022 01/13/2023: ALT 15; BUN 21; Hemoglobin 10.4; Platelets 232; Potassium 3.8; Sodium 136 06/25/2023: Creatinine, Ser 0.60    Lipid Panel    Component Value Date/Time   CHOL 167 01/08/2023 0328   CHOL 228 (H) 06/19/2020 1405   TRIG 561 (H) 01/08/2023 0328   HDL 31 (L) 01/08/2023 0328   HDL 35 (L) 06/19/2020 1405   CHOLHDL 5.4 01/08/2023 0328   VLDL UNABLE TO CALCULATE IF TRIGLYCERIDE OVER 400 mg/dL 16/10/9602 5409   LDLCALC UNABLE TO CALCULATE IF TRIGLYCERIDE OVER 400 mg/dL 81/19/1478 2956   LDLCALC 82 06/19/2020 1405   LDLDIRECT 81 01/08/2023 0328      Other studies Reviewed: Additional studies/ records that were reviewed today include:  Review of the above records demonstrates:       No data to display  ASSESSMENT AND  PLAN:    ICD-10-CM   1. Inappropriate sinus tachycardia (HCC)  I47.11 dapagliflozin  propanediol (FARXIGA ) 10 MG TABS tablet    clopidogrel  (PLAVIX ) 75 MG tablet    US  ARTERIAL SEG MULTIPLE (UE,WBI,SEG PRESSURES,PVR,ARMS)    torsemide (DEMADEX) 10 MG tablet    2. CHF (congestive heart failure), NYHA class III, acute on chronic, diastolic (HCC)  I50.33 dapagliflozin  propanediol (FARXIGA ) 10 MG TABS tablet    clopidogrel  (PLAVIX ) 75 MG tablet    US  ARTERIAL SEG MULTIPLE (UE,WBI,SEG PRESSURES,PVR,ARMS)    torsemide (DEMADEX) 10 MG tablet   start farxiga  and torsemide 10 mg    3. SOB (shortness of breath)  R06.02 dapagliflozin  propanediol (FARXIGA ) 10 MG TABS tablet    clopidogrel  (PLAVIX ) 75 MG tablet    US  ARTERIAL SEG MULTIPLE (UE,WBI,SEG PRESSURES,PVR,ARMS)    torsemide (DEMADEX) 10 MG tablet    4. Other chest pain  R07.89 dapagliflozin  propanediol (FARXIGA ) 10 MG TABS tablet    clopidogrel  (PLAVIX ) 75 MG tablet    US  ARTERIAL SEG MULTIPLE (UE,WBI,SEG PRESSURES,PVR,ARMS)    torsemide (DEMADEX) 10 MG tablet    5. 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 Swelling of joint of upper arm, right  M25.421 dapagliflozin  propanediol (FARXIGA ) 10 MG TABS tablet    clopidogrel  (PLAVIX ) 75 MG tablet    US  ARTERIAL SEG MULTIPLE (UE,WBI,SEG PRESSURES,PVR,ARMS)    torsemide (DEMADEX) 10 MG tablet   get arterial ultrasound arm as arm u/s venous negative for DVT    7. Atherosclerosis of coronary artery of native heart without angina pectoris, unspecified vessel or lesion type  I25.10 dapagliflozin  propanediol (FARXIGA ) 10 MG TABS tablet    clopidogrel  (PLAVIX ) 75 MG tablet    US  ARTERIAL SEG MULTIPLE (UE,WBI,SEG PRESSURES,PVR,ARMS)    torsemide (DEMADEX) 10 MG tablet   had PCI of LAD  for 95%, and  still has OM high grade disease needsing PCI 2 weeks    8. Inappropriate sinus tachycardia (HCC)  I47.11 dapagliflozin  propanediol (FARXIGA ) 10 MG TABS tablet    clopidogrel  (PLAVIX ) 75 MG tablet    US  ARTERIAL SEG MULTIPLE (UE,WBI,SEG PRESSURES,PVR,ARMS)    torsemide (DEMADEX) 10 MG tablet   increase metoprolol  50 bid    9. Chest pain, non-cardiac  R07.89 dapagliflozin  propanediol (FARXIGA ) 10 MG TABS tablet    clopidogrel  (PLAVIX ) 75 MG tablet    US  ARTERIAL SEG MULTIPLE (UE,WBI,SEG PRESSURES,PVR,ARMS)    torsemide (DEMADEX) 10 MG tablet    10. Chronic obstructive pulmonary disease, unspecified COPD type (HCC)  J44.9 dapagliflozin  propanediol (FARXIGA ) 10 MG TABS tablet    clopidogrel  (PLAVIX ) 75 MG tablet    US  ARTERIAL SEG MULTIPLE (UE,WBI,SEG PRESSURES,PVR,ARMS)    torsemide (DEMADEX) 10 MG tablet    11. Obstructive sleep apnea syndrome  G47.33 dapagliflozin  propanediol (FARXIGA ) 10 MG TABS tablet    clopidogrel  (PLAVIX ) 75 MG tablet    US  ARTERIAL SEG MULTIPLE (UE,WBI,SEG PRESSURES,PVR,ARMS)    torsemide (DEMADEX) 10 MG tablet    12. CHF (congestive heart failure), NYHA class III, acute on chronic, diastolic (HCC)  I50.33 dapagliflozin  propanediol (FARXIGA ) 10 MG TABS tablet    clopidogrel  (PLAVIX ) 75 MG tablet    US  ARTERIAL SEG MULTIPLE (UE,WBI,SEG PRESSURES,PVR,ARMS)    torsemide (DEMADEX) 10 MG tablet   add farxiag    13.  History of cerebellar CVA with residual deficit  I69.30 dapagliflozin  propanediol (FARXIGA ) 10 MG TABS tablet    clopidogrel  (PLAVIX ) 75 MG tablet    US  ARTERIAL SEG MULTIPLE (UE,WBI,SEG PRESSURES,PVR,ARMS)    torsemide (DEMADEX) 10 MG tablet       Problem List Items Addressed This Visit       Cardiovascular and Mediastinum   Coronary atherosclerosis   Relevant Medications   dapagliflozin  propanediol (FARXIGA ) 10 MG TABS tablet   clopidogrel  (PLAVIX ) 75 MG tablet   torsemide (DEMADEX) 10 MG tablet   Other Relevant Orders   US  ARTERIAL SEG  MULTIPLE (UE,WBI,SEG PRESSURES,PVR,ARMS)   Embolism of superior cerebellar artery   Relevant Medications   dapagliflozin  propanediol (FARXIGA ) 10 MG TABS tablet   clopidogrel  (PLAVIX ) 75 MG tablet   torsemide (DEMADEX) 10 MG tablet   Other Relevant Orders   US  ARTERIAL SEG MULTIPLE (UE,WBI,SEG PRESSURES,PVR,ARMS)     Respiratory   COPD (chronic obstructive pulmonary disease) (HCC) (Chronic)   Relevant Medications   dapagliflozin  propanediol (FARXIGA ) 10 MG TABS tablet   clopidogrel  (PLAVIX ) 75 MG tablet   torsemide (DEMADEX) 10 MG tablet   Other Relevant Orders   US  ARTERIAL SEG MULTIPLE (UE,WBI,SEG PRESSURES,PVR,ARMS)     Other   History of cerebellar CVA with residual deficit   Relevant Medications   dapagliflozin  propanediol (FARXIGA ) 10 MG TABS tablet   clopidogrel  (PLAVIX ) 75 MG tablet   torsemide (DEMADEX) 10 MG tablet   Other Relevant Orders   US  ARTERIAL SEG MULTIPLE (UE,WBI,SEG PRESSURES,PVR,ARMS)   Other Visit Diagnoses       Inappropriate sinus tachycardia (HCC)    -  Primary   Relevant Medications   dapagliflozin  propanediol (FARXIGA ) 10 MG TABS tablet   clopidogrel  (PLAVIX ) 75 MG tablet   torsemide (DEMADEX) 10 MG tablet   Other Relevant Orders   US  ARTERIAL SEG MULTIPLE (UE,WBI,SEG PRESSURES,PVR,ARMS)     CHF (congestive heart failure), NYHA class III, acute on chronic, diastolic (HCC)       start farxiga  and torsemide 10 mg   Relevant Medications   dapagliflozin  propanediol (FARXIGA ) 10 MG TABS tablet   clopidogrel  (PLAVIX ) 75 MG tablet   torsemide (DEMADEX) 10 MG tablet   Other Relevant Orders   US  ARTERIAL SEG MULTIPLE (UE,WBI,SEG PRESSURES,PVR,ARMS)     SOB (shortness of breath)       Relevant Medications   dapagliflozin  propanediol (FARXIGA ) 10 MG TABS tablet   clopidogrel  (PLAVIX ) 75 MG tablet   torsemide (DEMADEX) 10 MG tablet   Other Relevant Orders   US  ARTERIAL SEG MULTIPLE (UE,WBI,SEG PRESSURES,PVR,ARMS)     Other chest pain       Relevant  Medications   dapagliflozin  propanediol (FARXIGA ) 10 MG TABS tablet   clopidogrel  (PLAVIX ) 75 MG tablet   torsemide (DEMADEX) 10 MG tablet   Other Relevant Orders   US  ARTERIAL SEG MULTIPLE (UE,WBI,SEG PRESSURES,PVR,ARMS)     Swelling of joint of upper arm, right       get arterial ultrasound arm as arm u/s venous negative for DVT   Relevant Medications   dapagliflozin  propanediol (FARXIGA ) 10 MG TABS tablet   clopidogrel  (PLAVIX ) 75 MG tablet   torsemide (DEMADEX) 10 MG tablet   Other Relevant Orders   US  ARTERIAL SEG MULTIPLE (UE,WBI,SEG PRESSURES,PVR,ARMS)     Inappropriate sinus tachycardia (HCC)       increase metoprolol  50 bid   Relevant Medications   dapagliflozin  propanediol (FARXIGA ) 10 MG TABS tablet   clopidogrel  (PLAVIX ) 75 MG  tablet   torsemide (DEMADEX) 10 MG tablet   Other Relevant Orders   US  ARTERIAL SEG MULTIPLE (UE,WBI,SEG PRESSURES,PVR,ARMS)     Chest pain, non-cardiac       Relevant Medications   dapagliflozin  propanediol (FARXIGA ) 10 MG TABS tablet   clopidogrel  (PLAVIX ) 75 MG tablet   torsemide (DEMADEX) 10 MG tablet   Other Relevant Orders   US  ARTERIAL SEG MULTIPLE (UE,WBI,SEG PRESSURES,PVR,ARMS)     Obstructive sleep apnea syndrome       Relevant Medications   dapagliflozin  propanediol (FARXIGA ) 10 MG TABS tablet   clopidogrel  (PLAVIX ) 75 MG tablet   torsemide (DEMADEX) 10 MG tablet   Other Relevant Orders   US  ARTERIAL SEG MULTIPLE (UE,WBI,SEG PRESSURES,PVR,ARMS)     CHF (congestive heart failure), NYHA class III, acute on chronic, diastolic (HCC)       add farxiag   Relevant Medications   dapagliflozin  propanediol (FARXIGA ) 10 MG TABS tablet   clopidogrel  (PLAVIX ) 75 MG tablet   torsemide (DEMADEX) 10 MG tablet   Other Relevant Orders   US  ARTERIAL SEG MULTIPLE (UE,WBI,SEG PRESSURES,PVR,ARMS)          Disposition:   Return in about 3 weeks (around 07/29/2023) for ultrasound of right arm with Liaquat and f/u.    Total time spent: 4599  minutes  Signed,  Debborah Fairly, MD  07/08/2023 1:40 PM    [facility]

## 2023-07-09 DIAGNOSIS — R2681 Unsteadiness on feet: Secondary | ICD-10-CM | POA: Diagnosis not present

## 2023-07-09 DIAGNOSIS — M6281 Muscle weakness (generalized): Secondary | ICD-10-CM | POA: Diagnosis not present

## 2023-07-10 DIAGNOSIS — R2681 Unsteadiness on feet: Secondary | ICD-10-CM | POA: Diagnosis not present

## 2023-07-10 DIAGNOSIS — M6281 Muscle weakness (generalized): Secondary | ICD-10-CM | POA: Diagnosis not present

## 2023-07-11 DIAGNOSIS — R2681 Unsteadiness on feet: Secondary | ICD-10-CM | POA: Diagnosis not present

## 2023-07-11 DIAGNOSIS — M6281 Muscle weakness (generalized): Secondary | ICD-10-CM | POA: Diagnosis not present

## 2023-07-13 DIAGNOSIS — R2681 Unsteadiness on feet: Secondary | ICD-10-CM | POA: Diagnosis not present

## 2023-07-13 DIAGNOSIS — M6281 Muscle weakness (generalized): Secondary | ICD-10-CM | POA: Diagnosis not present

## 2023-07-14 DIAGNOSIS — R2681 Unsteadiness on feet: Secondary | ICD-10-CM | POA: Diagnosis not present

## 2023-07-14 DIAGNOSIS — M6281 Muscle weakness (generalized): Secondary | ICD-10-CM | POA: Diagnosis not present

## 2023-07-14 NOTE — Progress Notes (Signed)
 Pt is currently a resident at [facility] and does not have a personal phone number to contact her personally with. Did not leave patient results with front desk staff. Will try again to connect with pt.

## 2023-07-15 ENCOUNTER — Other Ambulatory Visit: Payer: Self-pay

## 2023-07-16 DIAGNOSIS — R2681 Unsteadiness on feet: Secondary | ICD-10-CM | POA: Diagnosis not present

## 2023-07-16 DIAGNOSIS — M6281 Muscle weakness (generalized): Secondary | ICD-10-CM | POA: Diagnosis not present

## 2023-07-17 DIAGNOSIS — M6281 Muscle weakness (generalized): Secondary | ICD-10-CM | POA: Diagnosis not present

## 2023-07-17 DIAGNOSIS — R2681 Unsteadiness on feet: Secondary | ICD-10-CM | POA: Diagnosis not present

## 2023-07-18 DIAGNOSIS — M6281 Muscle weakness (generalized): Secondary | ICD-10-CM | POA: Diagnosis not present

## 2023-07-18 DIAGNOSIS — R2681 Unsteadiness on feet: Secondary | ICD-10-CM | POA: Diagnosis not present

## 2023-07-21 DIAGNOSIS — R2681 Unsteadiness on feet: Secondary | ICD-10-CM | POA: Diagnosis not present

## 2023-07-21 DIAGNOSIS — M6281 Muscle weakness (generalized): Secondary | ICD-10-CM | POA: Diagnosis not present

## 2023-07-22 DIAGNOSIS — R2681 Unsteadiness on feet: Secondary | ICD-10-CM | POA: Diagnosis not present

## 2023-07-22 DIAGNOSIS — M6281 Muscle weakness (generalized): Secondary | ICD-10-CM | POA: Diagnosis not present

## 2023-07-23 DIAGNOSIS — M6281 Muscle weakness (generalized): Secondary | ICD-10-CM | POA: Diagnosis not present

## 2023-07-23 DIAGNOSIS — R2681 Unsteadiness on feet: Secondary | ICD-10-CM | POA: Diagnosis not present

## 2023-07-24 DIAGNOSIS — R2681 Unsteadiness on feet: Secondary | ICD-10-CM | POA: Diagnosis not present

## 2023-07-24 DIAGNOSIS — M6281 Muscle weakness (generalized): Secondary | ICD-10-CM | POA: Diagnosis not present

## 2023-07-25 DIAGNOSIS — M6281 Muscle weakness (generalized): Secondary | ICD-10-CM | POA: Diagnosis not present

## 2023-07-25 DIAGNOSIS — R2681 Unsteadiness on feet: Secondary | ICD-10-CM | POA: Diagnosis not present

## 2023-07-26 DIAGNOSIS — R2681 Unsteadiness on feet: Secondary | ICD-10-CM | POA: Diagnosis not present

## 2023-07-26 DIAGNOSIS — M6281 Muscle weakness (generalized): Secondary | ICD-10-CM | POA: Diagnosis not present

## 2023-07-27 DIAGNOSIS — M6281 Muscle weakness (generalized): Secondary | ICD-10-CM | POA: Diagnosis not present

## 2023-07-27 DIAGNOSIS — R2681 Unsteadiness on feet: Secondary | ICD-10-CM | POA: Diagnosis not present

## 2023-07-28 DIAGNOSIS — R2681 Unsteadiness on feet: Secondary | ICD-10-CM | POA: Diagnosis not present

## 2023-07-28 DIAGNOSIS — M6281 Muscle weakness (generalized): Secondary | ICD-10-CM | POA: Diagnosis not present

## 2023-07-30 ENCOUNTER — Ambulatory Visit (INDEPENDENT_AMBULATORY_CARE_PROVIDER_SITE_OTHER)

## 2023-07-30 DIAGNOSIS — R2231 Localized swelling, mass and lump, right upper limb: Secondary | ICD-10-CM

## 2023-07-30 DIAGNOSIS — R0789 Other chest pain: Secondary | ICD-10-CM

## 2023-07-30 DIAGNOSIS — M25421 Effusion, right elbow: Secondary | ICD-10-CM

## 2023-07-30 DIAGNOSIS — I4711 Inappropriate sinus tachycardia, so stated: Secondary | ICD-10-CM

## 2023-07-30 DIAGNOSIS — J449 Chronic obstructive pulmonary disease, unspecified: Secondary | ICD-10-CM | POA: Diagnosis not present

## 2023-07-30 DIAGNOSIS — I6930 Unspecified sequelae of cerebral infarction: Secondary | ICD-10-CM | POA: Diagnosis not present

## 2023-07-30 DIAGNOSIS — G4733 Obstructive sleep apnea (adult) (pediatric): Secondary | ICD-10-CM

## 2023-07-30 DIAGNOSIS — R0602 Shortness of breath: Secondary | ICD-10-CM

## 2023-07-30 DIAGNOSIS — I5033 Acute on chronic diastolic (congestive) heart failure: Secondary | ICD-10-CM

## 2023-07-30 DIAGNOSIS — I663 Occlusion and stenosis of cerebellar arteries: Secondary | ICD-10-CM | POA: Diagnosis not present

## 2023-07-30 DIAGNOSIS — I2510 Atherosclerotic heart disease of native coronary artery without angina pectoris: Secondary | ICD-10-CM

## 2023-08-04 ENCOUNTER — Ambulatory Visit (INDEPENDENT_AMBULATORY_CARE_PROVIDER_SITE_OTHER): Admitting: Cardiovascular Disease

## 2023-08-04 ENCOUNTER — Encounter: Payer: Self-pay | Admitting: Cardiovascular Disease

## 2023-08-04 ENCOUNTER — Telehealth: Payer: Self-pay

## 2023-08-04 VITALS — BP 115/72 | HR 94 | Ht <= 58 in | Wt 186.0 lb

## 2023-08-04 DIAGNOSIS — Z955 Presence of coronary angioplasty implant and graft: Secondary | ICD-10-CM | POA: Diagnosis not present

## 2023-08-04 DIAGNOSIS — R0789 Other chest pain: Secondary | ICD-10-CM | POA: Diagnosis not present

## 2023-08-04 DIAGNOSIS — I5033 Acute on chronic diastolic (congestive) heart failure: Secondary | ICD-10-CM | POA: Diagnosis not present

## 2023-08-04 DIAGNOSIS — I4711 Inappropriate sinus tachycardia, so stated: Secondary | ICD-10-CM

## 2023-08-04 DIAGNOSIS — R0602 Shortness of breath: Secondary | ICD-10-CM

## 2023-08-04 DIAGNOSIS — Z0130 Encounter for examination of blood pressure without abnormal findings: Secondary | ICD-10-CM

## 2023-08-04 NOTE — Telephone Encounter (Signed)
 Patient was in office today for a visit with you and she said she was having elevated BP's which was incorrect according to the LPN at [facility], she said the patient HR has been very elevated for the last month they range from 92- 123, we have scanned a copy in to the chart, they said she takes her metoprolol  succ 100mg  every day please advise if you think she needs any changes and she also said they know how take BP's and that they send this information in a packet with the  patient to every visit

## 2023-08-04 NOTE — Progress Notes (Signed)
 Cardiology Office Note   Date:  08/04/2023   ID:  Yvette Riley, DOB July 22, 1955, MRN [id number]  PCP:  Andi Jointer, DO  Cardiologist:  Denyse Bathe, MD      History of Present Illness: Yvette Riley is a 68 y.o. female who presents for No chief complaint on file.   Has neuropathy of legs, has SOB.      Past Medical History:  Diagnosis Date   Allergy    Anemia    Anxiety    Arthritis    Asthma    Blood transfusion without reported diagnosis    C. difficile diarrhea 04/07/2016   CAP (community acquired pneumonia) 03/21/2015   Combined forms of age-related cataract of both eyes 05/06/2016   COPD (chronic obstructive pulmonary disease) (HCC)    CVA (cerebral vascular accident) (HCC) 06/17/2018   Diabetes mellitus without complication (HCC)    Displacement of lumbar intervertebral disc without myelopathy    Emphysema of lung (HCC)    GERD (gastroesophageal reflux disease)    Glaucoma    History of chicken pox    Hyperlipidemia    Hypertension    NSTEMI (non-ST elevated myocardial infarction) (HCC) 01/07/2023   Pneumonia 03/29/2015   Sepsis (HCC) 03/17/2016   Shortness of breath    Small cell lung cancer (HCC)      Past Surgical History:  Procedure Laterality Date   BALLOON DILATION  11/20/2022   Procedure: BALLOON DILATION;  Surgeon: Aundria, Ladell POUR, MD;  Location: [facility] ENDOSCOPY;  Service: Gastroenterology;;   BIOPSY  11/20/2022   Procedure: BIOPSY;  Surgeon: Toledo, Ladell POUR, MD;  Location: [facility] ENDOSCOPY;  Service: Gastroenterology;;   CESAREAN SECTION     CORONARY STENT INTERVENTION N/A 01/08/2023   Procedure: CORONARY STENT INTERVENTION;  Surgeon: Florencio Cara BIRCH, MD;  Location: [facility] INVASIVE CV LAB;  Service: Cardiovascular;  Laterality: N/A;   ESOPHAGOGASTRODUODENOSCOPY N/A 05/17/2022   Procedure: ESOPHAGOGASTRODUODENOSCOPY (EGD);  Surgeon: Jinny Carmine, MD;  Location: [facility] ENDOSCOPY;  Service: Endoscopy;  Laterality: N/A;    ESOPHAGOGASTRODUODENOSCOPY N/A 07/10/2022   Procedure: ESOPHAGOGASTRODUODENOSCOPY (EGD);  Surgeon: Toledo, Ladell POUR, MD;  Location: [facility] ENDOSCOPY;  Service: Gastroenterology;  Laterality: N/A;   ESOPHAGOGASTRODUODENOSCOPY (EGD) WITH PROPOFOL  N/A 02/08/2022   Procedure: ESOPHAGOGASTRODUODENOSCOPY (EGD) WITH PROPOFOL ;  Surgeon: Therisa Bi, MD;  Location: [facility] ENDOSCOPY;  Service: Gastroenterology;  Laterality: N/A;   ESOPHAGOGASTRODUODENOSCOPY (EGD) WITH PROPOFOL  N/A 11/20/2022   Procedure: ESOPHAGOGASTRODUODENOSCOPY (EGD) WITH PROPOFOL ;  Surgeon: Toledo, Ladell POUR, MD;  Location: [facility] ENDOSCOPY;  Service: Gastroenterology;  Laterality: N/A;   ESOPHAGOGASTRODUODENOSCOPY (EGD) WITH PROPOFOL  N/A 12/25/2022   Procedure: ESOPHAGOGASTRODUODENOSCOPY (EGD) WITH PROPOFOL ;  Surgeon: Unk Corinn Skiff, MD;  Location: [facility] ENDOSCOPY;  Service: Gastroenterology;  Laterality: N/A;   LEFT HEART CATH AND CORONARY ANGIOGRAPHY N/A 01/08/2023   Procedure: LEFT HEART CATH AND CORONARY ANGIOGRAPHY;  Surgeon: Bathe Denyse LABOR, MD;  Location: [facility] INVASIVE CV LAB;  Service: Cardiovascular;  Laterality: N/A;   TUBAL LIGATION     UPPER GI ENDOSCOPY       Current Outpatient Medications  Medication Sig Dispense Refill   acetaminophen  (TYLENOL ) 325 MG tablet Take 650 mg by mouth every 6 (six) hours as needed.     albuterol  (PROVENTIL ) (2.5 MG/3ML) 0.083% nebulizer solution Take 2.5 mg by nebulization every 6 (six) hours.     aspirin  EC 81 MG tablet Take 1 tablet (81 mg total) by mouth daily. Swallow whole. 30 tablet 12   busPIRone  (BUSPAR ) 5 MG tablet Take 5 mg  by mouth 3 (three) times daily.     Cholecalciferol (VITAMIN D) 50 MCG (2000 UT) CAPS Take 1,000 Units by mouth daily.     clopidogrel  (PLAVIX ) 75 MG tablet Take 1 tablet (75 mg total) by mouth daily. 90 tablet 4   dapagliflozin  propanediol (FARXIGA ) 10 MG TABS tablet Take 1 tablet (10 mg total) by mouth daily before breakfast. 30 tablet 3   diclofenac  Sodium  (VOLTAREN ) 1 % GEL Apply 4 g topically 4 (four) times daily. (Patient taking differently: Apply 2 g topically every 6 (six) hours as needed (pain). Apply to back of neck, not to exceed 16 grams per day.) 350 g 3   econazole nitrate 1 % cream Apply 1 Application topically 2 (two) times daily as needed.     ezetimibe  (ZETIA ) 10 MG tablet Take 10 mg by mouth daily.     Fluticasone -Umeclidin-Vilant (TRELEGY ELLIPTA ) 200-62.5-25 MCG/ACT AEPB Inhale 1 puff into the lungs daily. 30 each 12   gabapentin  (NEURONTIN ) 300 MG capsule Take 300 mg by mouth 2 (two) times daily.     hydrocortisone cream 1 % Apply 1 Application topically every 6 (six) hours as needed for itching.     ketoconazole  (NIZORAL ) 2 % shampoo Apply 1 Application topically 2 (two) times a week. Apply to scalp twice a week during shower on Wednesday and Saturday. Lather and allow to sit for 5 minutes before rinsing.     levothyroxine  (SYNTHROID ) 88 MCG tablet Take 88 mcg by mouth daily.     loperamide (IMODIUM) 2 MG capsule Take by mouth as needed for diarrhea or loose stools.     Magnesium 400 MG TABS Take 1 tablet by mouth 2 (two) times daily.     metFORMIN  (GLUCOPHAGE ) 500 MG tablet Take 500 mg by mouth 2 (two) times daily with a meal.     metoprolol  succinate (TOPROL  XL) 100 MG 24 hr tablet Take 1 tablet (100 mg total) by mouth daily. Take with or immediately following a meal. 30 tablet 11   omeprazole  (PRILOSEC) 40 MG capsule Take 1 capsule (40 mg total) by mouth 2 (two) times daily. Take in the morning and at dinnertime. 120 capsule 0   ondansetron  (ZOFRAN ) 4 MG tablet Take 4 mg by mouth every 6 (six) hours as needed for nausea.     oxyCODONE -acetaminophen  (PERCOCET) 10-325 MG tablet Take 1 tablet by mouth 4 (four) times daily as needed.     senna (SENOKOT) 8.6 MG TABS tablet Take 2 tablets by mouth daily.     sertraline  (ZOLOFT ) 50 MG tablet Take 1 tablet (50 mg total) by mouth daily. 90 tablet 4   torsemide  (DEMADEX ) 10 MG tablet Take  1 tablet (10 mg total) by mouth daily. 30 tablet 11   vitamin B-12 (CYANOCOBALAMIN ) 1000 MCG tablet Take 1 tablet (1,000 mcg total) by mouth daily. 30 tablet 0   zaleplon  (SONATA ) 5 MG capsule Take 5 mg by mouth at bedtime.     No current facility-administered medications for this visit.    Allergies:   Other, Penicillins, Yellow jacket venom [bee venom], Codeine, Crestor [rosuvastatin], Gemfibrozil , Trazodone and nefazodone, and Lipitor [atorvastatin]    Social History:   reports that she quit smoking about 7 years ago. Her smoking use included cigarettes. She started smoking about 52 years ago. She has a 11.3 pack-year smoking history. She has never used smokeless tobacco. She reports that she does not drink alcohol and does not use drugs.   Family History:  family  history includes COPD in her brother; Cancer in her maternal aunt; Heart disease in her brother and mother; Heart failure in her mother; Stroke in her mother.    ROS:     Review of Systems  Constitutional: Negative.   HENT: Negative.    Eyes: Negative.   Respiratory: Negative.    Gastrointestinal: Negative.   Genitourinary: Negative.   Musculoskeletal: Negative.   Skin: Negative.   Neurological: Negative.   Endo/Heme/Allergies: Negative.   Psychiatric/Behavioral: Negative.    All other systems reviewed and are negative.     All other systems are reviewed and negative.    PHYSICAL EXAM: VS:  BP 115/72   Pulse 94   Ht 4' 9 (1.448 m)   Wt 186 lb (84.4 kg)   SpO2 96%   BMI 40.25 kg/m  , BMI Body mass index is 40.25 kg/m. Last weight:  Wt Readings from Last 3 Encounters:  08/04/23 186 lb (84.4 kg)  07/08/23 186 lb (84.4 kg)  06/13/23 189 lb (85.7 kg)     Physical Exam Constitutional:      Appearance: Normal appearance.  Cardiovascular:     Rate and Rhythm: Normal rate and regular rhythm.     Heart sounds: Normal heart sounds.  Pulmonary:     Effort: Pulmonary effort is normal.     Breath sounds:  Normal breath sounds.  Musculoskeletal:     Right lower leg: No edema.     Left lower leg: No edema.  Neurological:     Mental Status: She is alert.       EKG:   Recent Labs: 01/08/2023: TSH 7.022 01/13/2023: ALT 15; BUN 21; Hemoglobin 10.4; Platelets 232; Potassium 3.8; Sodium 136 06/25/2023: Creatinine, Ser 0.60    Lipid Panel    Component Value Date/Time   CHOL 167 01/08/2023 0328   CHOL 228 (H) 06/19/2020 1405   TRIG 561 (H) 01/08/2023 0328   HDL 31 (L) 01/08/2023 0328   HDL 35 (L) 06/19/2020 1405   CHOLHDL 5.4 01/08/2023 0328   VLDL UNABLE TO CALCULATE IF TRIGLYCERIDE OVER 400 mg/dL 87/88/7975 9671   LDLCALC UNABLE TO CALCULATE IF TRIGLYCERIDE OVER 400 mg/dL 87/88/7975 9671   LDLCALC 82 06/19/2020 1405   LDLDIRECT 81 01/08/2023 0328      Other studies Reviewed: Additional studies/ records that were reviewed today include:  Review of the above records demonstrates:       No data to display            ASSESSMENT AND PLAN:    ICD-10-CM   1. Other chest pain  R07.89 PCV ECHOCARDIOGRAM COMPLETE    MYOCARDIAL PERFUSION IMAGING   no chest pain, but gets SOB a lot. ! vessel had high grade lesion not treated, will look for ischaemia by doing stress test    2. Inappropriate sinus tachycardia (HCC)  I47.11 PCV ECHOCARDIOGRAM COMPLETE    MYOCARDIAL PERFUSION IMAGING    3. CHF (congestive heart failure), NYHA class III, acute on chronic, diastolic (HCC)  I50.33 PCV ECHOCARDIOGRAM COMPLETE    MYOCARDIAL PERFUSION IMAGING    4. SOB (shortness of breath)  R06.02 PCV ECHOCARDIOGRAM COMPLETE    MYOCARDIAL PERFUSION IMAGING    5. History of placement of stent in LAD coronary artery  Z95.5 PCV ECHOCARDIOGRAM COMPLETE    MYOCARDIAL PERFUSION IMAGING       Problem List Items Addressed This Visit   None Visit Diagnoses       Other chest pain    -  Primary   no chest pain, but gets SOB a lot. ! vessel had high grade lesion not treated, will look for ischaemia by  doing stress test   Relevant Orders   PCV ECHOCARDIOGRAM COMPLETE   MYOCARDIAL PERFUSION IMAGING     Inappropriate sinus tachycardia (HCC)       Relevant Orders   PCV ECHOCARDIOGRAM COMPLETE   MYOCARDIAL PERFUSION IMAGING     CHF (congestive heart failure), NYHA class III, acute on chronic, diastolic (HCC)       Relevant Orders   PCV ECHOCARDIOGRAM COMPLETE   MYOCARDIAL PERFUSION IMAGING     SOB (shortness of breath)       Relevant Orders   PCV ECHOCARDIOGRAM COMPLETE   MYOCARDIAL PERFUSION IMAGING     History of placement of stent in LAD coronary artery       Relevant Orders   PCV ECHOCARDIOGRAM COMPLETE   MYOCARDIAL PERFUSION IMAGING          Disposition:   No follow-ups on file.    Total time spent: 30 minutes  Signed,  Denyse Bathe, MD  08/04/2023 11:01 AM    [facility]

## 2023-08-05 NOTE — Telephone Encounter (Signed)
 Need to call pts Charge Nurse back and let her know

## 2023-08-06 DIAGNOSIS — J449 Chronic obstructive pulmonary disease, unspecified: Secondary | ICD-10-CM | POA: Diagnosis not present

## 2023-08-06 DIAGNOSIS — G629 Polyneuropathy, unspecified: Secondary | ICD-10-CM | POA: Diagnosis not present

## 2023-08-06 DIAGNOSIS — I2510 Atherosclerotic heart disease of native coronary artery without angina pectoris: Secondary | ICD-10-CM | POA: Diagnosis not present

## 2023-08-06 DIAGNOSIS — E785 Hyperlipidemia, unspecified: Secondary | ICD-10-CM | POA: Diagnosis not present

## 2023-08-06 DIAGNOSIS — C3490 Malignant neoplasm of unspecified part of unspecified bronchus or lung: Secondary | ICD-10-CM | POA: Diagnosis not present

## 2023-08-06 DIAGNOSIS — I10 Essential (primary) hypertension: Secondary | ICD-10-CM | POA: Diagnosis not present

## 2023-08-06 DIAGNOSIS — E039 Hypothyroidism, unspecified: Secondary | ICD-10-CM | POA: Diagnosis not present

## 2023-08-20 ENCOUNTER — Ambulatory Visit

## 2023-08-20 DIAGNOSIS — I351 Nonrheumatic aortic (valve) insufficiency: Secondary | ICD-10-CM

## 2023-08-20 DIAGNOSIS — R0789 Other chest pain: Secondary | ICD-10-CM

## 2023-08-20 DIAGNOSIS — Z955 Presence of coronary angioplasty implant and graft: Secondary | ICD-10-CM

## 2023-08-20 DIAGNOSIS — I4711 Inappropriate sinus tachycardia, so stated: Secondary | ICD-10-CM

## 2023-08-20 DIAGNOSIS — R0602 Shortness of breath: Secondary | ICD-10-CM

## 2023-08-20 DIAGNOSIS — I5033 Acute on chronic diastolic (congestive) heart failure: Secondary | ICD-10-CM

## 2023-08-27 ENCOUNTER — Ambulatory Visit

## 2023-08-27 MED ORDER — TECHNETIUM TC 99M SESTAMIBI GENERIC - CARDIOLITE
10.7000 | Freq: Once | INTRAVENOUS | Status: AC | PRN
  Administered 2023-08-27: 10.7 via INTRAVENOUS

## 2023-09-02 DIAGNOSIS — G8929 Other chronic pain: Secondary | ICD-10-CM | POA: Diagnosis not present

## 2023-09-04 ENCOUNTER — Ambulatory Visit: Admitting: Cardiovascular Disease

## 2023-09-09 DIAGNOSIS — E538 Deficiency of other specified B group vitamins: Secondary | ICD-10-CM | POA: Diagnosis not present

## 2023-09-09 DIAGNOSIS — E119 Type 2 diabetes mellitus without complications: Secondary | ICD-10-CM | POA: Diagnosis not present

## 2023-09-09 DIAGNOSIS — E785 Hyperlipidemia, unspecified: Secondary | ICD-10-CM | POA: Diagnosis not present

## 2023-09-09 DIAGNOSIS — L210 Seborrhea capitis: Secondary | ICD-10-CM | POA: Diagnosis not present

## 2023-09-09 DIAGNOSIS — G8929 Other chronic pain: Secondary | ICD-10-CM | POA: Diagnosis not present

## 2023-09-09 DIAGNOSIS — I2510 Atherosclerotic heart disease of native coronary artery without angina pectoris: Secondary | ICD-10-CM | POA: Diagnosis not present

## 2023-09-09 DIAGNOSIS — I214 Non-ST elevation (NSTEMI) myocardial infarction: Secondary | ICD-10-CM | POA: Diagnosis not present

## 2023-09-12 ENCOUNTER — Ambulatory Visit (INDEPENDENT_AMBULATORY_CARE_PROVIDER_SITE_OTHER): Admitting: Cardiovascular Disease

## 2023-09-12 ENCOUNTER — Encounter: Payer: Self-pay | Admitting: Cardiovascular Disease

## 2023-09-12 VITALS — BP 98/68 | HR 96 | Ht <= 58 in | Wt 187.0 lb

## 2023-09-12 DIAGNOSIS — I214 Non-ST elevation (NSTEMI) myocardial infarction: Secondary | ICD-10-CM | POA: Diagnosis not present

## 2023-09-12 DIAGNOSIS — I2510 Atherosclerotic heart disease of native coronary artery without angina pectoris: Secondary | ICD-10-CM | POA: Diagnosis not present

## 2023-09-12 DIAGNOSIS — I6501 Occlusion and stenosis of right vertebral artery: Secondary | ICD-10-CM | POA: Diagnosis not present

## 2023-09-12 DIAGNOSIS — I4711 Inappropriate sinus tachycardia, so stated: Secondary | ICD-10-CM

## 2023-09-12 DIAGNOSIS — Z0130 Encounter for examination of blood pressure without abnormal findings: Secondary | ICD-10-CM

## 2023-09-12 DIAGNOSIS — I663 Occlusion and stenosis of cerebellar arteries: Secondary | ICD-10-CM

## 2023-09-12 NOTE — Progress Notes (Signed)
 Cardiology Office Note   Date:  09/12/2023   ID:  Yvette Riley, DOB 01-19-1956, MRN [id number]  PCP:  Andi Jointer, DO  Cardiologist:  Denyse Bathe, MD      History of Present Illness: Yvette Riley is a 68 y.o. female who presents for  Chief Complaint  Patient presents with   Follow-up    1 month follow up/ NST/ ECHO results    Has high pulse, but no palpitation. I feel fine.      Past Medical History:  Diagnosis Date   Allergy    Anemia    Anxiety    Arthritis    Asthma    Blood transfusion without reported diagnosis    C. difficile diarrhea 04/07/2016   CAP (community acquired pneumonia) 03/21/2015   Combined forms of age-related cataract of both eyes 05/06/2016   COPD (chronic obstructive pulmonary disease) (HCC)    CVA (cerebral vascular accident) (HCC) 06/17/2018   Diabetes mellitus without complication (HCC)    Displacement of lumbar intervertebral disc without myelopathy    Emphysema of lung (HCC)    GERD (gastroesophageal reflux disease)    Glaucoma    History of chicken pox    Hyperlipidemia    Hypertension    NSTEMI (non-ST elevated myocardial infarction) (HCC) 01/07/2023   Pneumonia 03/29/2015   Sepsis (HCC) 03/17/2016   Shortness of breath    Small cell lung cancer (HCC)      Past Surgical History:  Procedure Laterality Date   BALLOON DILATION  11/20/2022   Procedure: BALLOON DILATION;  Surgeon: Aundria, Ladell POUR, MD;  Location: [facility] ENDOSCOPY;  Service: Gastroenterology;;   BIOPSY  11/20/2022   Procedure: BIOPSY;  Surgeon: Toledo, Ladell POUR, MD;  Location: [facility] ENDOSCOPY;  Service: Gastroenterology;;   CESAREAN SECTION     CORONARY STENT INTERVENTION N/A 01/08/2023   Procedure: CORONARY STENT INTERVENTION;  Surgeon: Florencio Cara BIRCH, MD;  Location: [facility] INVASIVE CV LAB;  Service: Cardiovascular;  Laterality: N/A;   ESOPHAGOGASTRODUODENOSCOPY N/A 05/17/2022   Procedure: ESOPHAGOGASTRODUODENOSCOPY (EGD);  Surgeon: Jinny Carmine,  MD;  Location: [facility] ENDOSCOPY;  Service: Endoscopy;  Laterality: N/A;   ESOPHAGOGASTRODUODENOSCOPY N/A 07/10/2022   Procedure: ESOPHAGOGASTRODUODENOSCOPY (EGD);  Surgeon: Toledo, Ladell POUR, MD;  Location: [facility] ENDOSCOPY;  Service: Gastroenterology;  Laterality: N/A;   ESOPHAGOGASTRODUODENOSCOPY (EGD) WITH PROPOFOL  N/A 02/08/2022   Procedure: ESOPHAGOGASTRODUODENOSCOPY (EGD) WITH PROPOFOL ;  Surgeon: Therisa Bi, MD;  Location: [facility] ENDOSCOPY;  Service: Gastroenterology;  Laterality: N/A;   ESOPHAGOGASTRODUODENOSCOPY (EGD) WITH PROPOFOL  N/A 11/20/2022   Procedure: ESOPHAGOGASTRODUODENOSCOPY (EGD) WITH PROPOFOL ;  Surgeon: Toledo, Ladell POUR, MD;  Location: [facility] ENDOSCOPY;  Service: Gastroenterology;  Laterality: N/A;   ESOPHAGOGASTRODUODENOSCOPY (EGD) WITH PROPOFOL  N/A 12/25/2022   Procedure: ESOPHAGOGASTRODUODENOSCOPY (EGD) WITH PROPOFOL ;  Surgeon: Unk Corinn Skiff, MD;  Location: [facility] ENDOSCOPY;  Service: Gastroenterology;  Laterality: N/A;   LEFT HEART CATH AND CORONARY ANGIOGRAPHY N/A 01/08/2023   Procedure: LEFT HEART CATH AND CORONARY ANGIOGRAPHY;  Surgeon: Bathe Denyse LABOR, MD;  Location: [facility] INVASIVE CV LAB;  Service: Cardiovascular;  Laterality: N/A;   TUBAL LIGATION     UPPER GI ENDOSCOPY       Current Outpatient Medications  Medication Sig Dispense Refill   albuterol  (PROVENTIL ) (2.5 MG/3ML) 0.083% nebulizer solution Take 2.5 mg by nebulization every 6 (six) hours.     aspirin  EC 81 MG tablet Take 1 tablet (81 mg total) by mouth daily. Swallow whole. 30 tablet 12   busPIRone  (BUSPAR ) 5 MG tablet Take 5 mg by  mouth 3 (three) times daily.     Cholecalciferol (VITAMIN D) 50 MCG (2000 UT) CAPS Take 1,000 Units by mouth daily.     clopidogrel  (PLAVIX ) 75 MG tablet Take 1 tablet (75 mg total) by mouth daily. 90 tablet 4   dapagliflozin  propanediol (FARXIGA ) 10 MG TABS tablet Take 1 tablet (10 mg total) by mouth daily before breakfast. 30 tablet 3   diclofenac  Sodium (VOLTAREN ) 1 % GEL Apply  4 g topically 4 (four) times daily. (Patient taking differently: Apply 2 g topically every 6 (six) hours as needed (pain). Apply to back of neck, not to exceed 16 grams per day.) 350 g 3   econazole nitrate 1 % cream Apply 1 Application topically 2 (two) times daily as needed.     ezetimibe  (ZETIA ) 10 MG tablet Take 10 mg by mouth daily.     Fluticasone -Umeclidin-Vilant (TRELEGY ELLIPTA ) 200-62.5-25 MCG/ACT AEPB Inhale 1 puff into the lungs daily. 30 each 12   gabapentin  (NEURONTIN ) 300 MG capsule Take 300 mg by mouth 2 (two) times daily.     hydrocortisone cream 1 % Apply 1 Application topically every 6 (six) hours as needed for itching.     ketoconazole  (NIZORAL ) 2 % shampoo Apply 1 Application topically 2 (two) times a week. Apply to scalp twice a week during shower on Wednesday and Saturday. Lather and allow to sit for 5 minutes before rinsing.     levothyroxine  (SYNTHROID ) 88 MCG tablet Take 88 mcg by mouth daily.     loperamide (IMODIUM) 2 MG capsule Take by mouth as needed for diarrhea or loose stools.     Magnesium 400 MG TABS Take 1 tablet by mouth 2 (two) times daily.     metFORMIN  (GLUCOPHAGE ) 500 MG tablet Take 500 mg by mouth 2 (two) times daily with a meal.     metoprolol  succinate (TOPROL  XL) 100 MG 24 hr tablet Take 1 tablet (100 mg total) by mouth daily. Take with or immediately following a meal. 30 tablet 11   nystatin (MYCOSTATIN/NYSTOP) powder Apply topically 3 (three) times daily.     omeprazole  (PRILOSEC) 40 MG capsule Take 1 capsule (40 mg total) by mouth 2 (two) times daily. Take in the morning and at dinnertime. 120 capsule 0   ondansetron  (ZOFRAN ) 4 MG tablet Take 4 mg by mouth every 6 (six) hours as needed for nausea.     oxyCODONE -acetaminophen  (PERCOCET) 10-325 MG tablet Take 1 tablet by mouth 4 (four) times daily as needed.     senna (SENOKOT) 8.6 MG TABS tablet Take 2 tablets by mouth daily.     sertraline  (ZOLOFT ) 50 MG tablet Take 1 tablet (50 mg total) by mouth  daily. 90 tablet 4   torsemide  (DEMADEX ) 10 MG tablet Take 1 tablet (10 mg total) by mouth daily. 30 tablet 11   vitamin B-12 (CYANOCOBALAMIN ) 1000 MCG tablet Take 1 tablet (1,000 mcg total) by mouth daily. 30 tablet 0   zaleplon  (SONATA ) 5 MG capsule Take 5 mg by mouth at bedtime.     No current facility-administered medications for this visit.    Allergies:   Other, Penicillins, Yellow jacket venom [bee venom], Codeine, Crestor [rosuvastatin], Gemfibrozil , Trazodone and nefazodone, and Lipitor [atorvastatin]    Social History:   reports that she quit smoking about 7 years ago. Her smoking use included cigarettes. She started smoking about 52 years ago. She has a 11.3 pack-year smoking history. She has never used smokeless tobacco. She reports that she does not drink  alcohol and does not use drugs.   Family History:  family history includes COPD in her brother; Cancer in her maternal aunt; Heart disease in her brother and mother; Heart failure in her mother; Stroke in her mother.    ROS:     Review of Systems  Constitutional: Negative.   HENT: Negative.    Eyes: Negative.   Respiratory: Negative.    Gastrointestinal: Negative.   Genitourinary: Negative.   Musculoskeletal: Negative.   Skin: Negative.   Neurological: Negative.   Endo/Heme/Allergies: Negative.   Psychiatric/Behavioral: Negative.    All other systems reviewed and are negative.     All other systems are reviewed and negative.    PHYSICAL EXAM: VS:  BP 98/68   Pulse 96   Ht 4' 9 (1.448 m)   Wt 187 lb (84.8 kg)   SpO2 91%   BMI 40.47 kg/m  , BMI Body mass index is 40.47 kg/m. Last weight:  Wt Readings from Last 3 Encounters:  09/12/23 187 lb (84.8 kg)  08/04/23 186 lb (84.4 kg)  07/08/23 186 lb (84.4 kg)     Physical Exam Constitutional:      Appearance: Normal appearance.  Cardiovascular:     Rate and Rhythm: Normal rate and regular rhythm.     Heart sounds: Normal heart sounds.  Pulmonary:      Effort: Pulmonary effort is normal.     Breath sounds: Normal breath sounds.  Musculoskeletal:     Right lower leg: No edema.     Left lower leg: No edema.  Neurological:     Mental Status: She is alert.       EKG:   Recent Labs: 01/08/2023: TSH 7.022 01/13/2023: ALT 15; BUN 21; Hemoglobin 10.4; Platelets 232; Potassium 3.8; Sodium 136 06/25/2023: Creatinine, Ser 0.60    Lipid Panel    Component Value Date/Time   CHOL 167 01/08/2023 0328   CHOL 228 (H) 06/19/2020 1405   TRIG 561 (H) 01/08/2023 0328   HDL 31 (L) 01/08/2023 0328   HDL 35 (L) 06/19/2020 1405   CHOLHDL 5.4 01/08/2023 0328   VLDL UNABLE TO CALCULATE IF TRIGLYCERIDE OVER 400 mg/dL 87/88/7975 9671   LDLCALC UNABLE TO CALCULATE IF TRIGLYCERIDE OVER 400 mg/dL 87/88/7975 9671   LDLCALC 82 06/19/2020 1405   LDLDIRECT 81 01/08/2023 0328      Other studies Reviewed: Additional studies/ records that were reviewed today include:  Review of the above records demonstrates:       No data to display            ASSESSMENT AND PLAN:    ICD-10-CM   1. Inappropriate sinus tachycardia (HCC)  I47.11    Has HR 96 today on metoprolol  100mg . asymptomatic, so going up on metoprolol  will lower BP, which is low as is. ECHo had normal LVEF.    2. Atherosclerosis of coronary artery of native heart without angina pectoris, unspecified vessel or lesion type  I25.10    No chest pain , consider cath if have CP.    3. 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 NSTEMI (non-ST elevated myocardial infarction) (HCC)  I21.4    PCI of mid ALD, last year, OM has 70% but no chest pains now.    5. Vertebral artery stenosis, right  I65.01        Problem List Items Addressed This Visit       Cardiovascular and Mediastinum   Coronary atherosclerosis   Embolism of  superior cerebellar artery   Vertebral artery stenosis, right   NSTEMI (non-ST elevated myocardial infarction) (HCC)   Other Visit Diagnoses        Inappropriate sinus tachycardia (HCC)    -  Primary   Has HR 96 today on metoprolol  100mg . asymptomatic, so going up on metoprolol  will lower BP, which is low as is. ECHo had normal LVEF.          Disposition:   Return in about 3 months (around 12/13/2023).    Total time spent: 30 minutes  Signed,  Denyse Bathe, MD  09/12/2023 10:51 AM    [facility]

## 2023-09-26 DIAGNOSIS — M6281 Muscle weakness (generalized): Secondary | ICD-10-CM | POA: Diagnosis not present

## 2023-09-29 DIAGNOSIS — M6281 Muscle weakness (generalized): Secondary | ICD-10-CM | POA: Diagnosis not present

## 2023-09-30 DIAGNOSIS — M6281 Muscle weakness (generalized): Secondary | ICD-10-CM | POA: Diagnosis not present

## 2023-10-01 DIAGNOSIS — M6281 Muscle weakness (generalized): Secondary | ICD-10-CM | POA: Diagnosis not present

## 2023-10-02 DIAGNOSIS — G8921 Chronic pain due to trauma: Secondary | ICD-10-CM | POA: Diagnosis not present

## 2023-10-02 DIAGNOSIS — K5900 Constipation, unspecified: Secondary | ICD-10-CM | POA: Diagnosis not present

## 2023-10-02 DIAGNOSIS — E559 Vitamin D deficiency, unspecified: Secondary | ICD-10-CM | POA: Diagnosis not present

## 2023-10-02 DIAGNOSIS — G629 Polyneuropathy, unspecified: Secondary | ICD-10-CM | POA: Diagnosis not present

## 2023-10-02 DIAGNOSIS — I6523 Occlusion and stenosis of bilateral carotid arteries: Secondary | ICD-10-CM | POA: Diagnosis not present

## 2023-10-02 DIAGNOSIS — M6281 Muscle weakness (generalized): Secondary | ICD-10-CM | POA: Diagnosis not present

## 2023-10-03 DIAGNOSIS — M6281 Muscle weakness (generalized): Secondary | ICD-10-CM | POA: Diagnosis not present

## 2023-10-06 DIAGNOSIS — M6281 Muscle weakness (generalized): Secondary | ICD-10-CM | POA: Diagnosis not present

## 2023-10-07 DIAGNOSIS — M6281 Muscle weakness (generalized): Secondary | ICD-10-CM | POA: Diagnosis not present

## 2023-10-08 DIAGNOSIS — M6281 Muscle weakness (generalized): Secondary | ICD-10-CM | POA: Diagnosis not present

## 2023-10-09 DIAGNOSIS — M6281 Muscle weakness (generalized): Secondary | ICD-10-CM | POA: Diagnosis not present

## 2023-10-10 DIAGNOSIS — M6281 Muscle weakness (generalized): Secondary | ICD-10-CM | POA: Diagnosis not present

## 2023-10-13 DIAGNOSIS — G8929 Other chronic pain: Secondary | ICD-10-CM | POA: Diagnosis not present

## 2023-10-13 DIAGNOSIS — M6281 Muscle weakness (generalized): Secondary | ICD-10-CM | POA: Diagnosis not present

## 2023-10-14 DIAGNOSIS — M6281 Muscle weakness (generalized): Secondary | ICD-10-CM | POA: Diagnosis not present

## 2023-10-15 DIAGNOSIS — I10 Essential (primary) hypertension: Secondary | ICD-10-CM | POA: Diagnosis not present

## 2023-10-15 DIAGNOSIS — E119 Type 2 diabetes mellitus without complications: Secondary | ICD-10-CM | POA: Diagnosis not present

## 2023-10-15 DIAGNOSIS — M6281 Muscle weakness (generalized): Secondary | ICD-10-CM | POA: Diagnosis not present

## 2023-10-16 DIAGNOSIS — M6281 Muscle weakness (generalized): Secondary | ICD-10-CM | POA: Diagnosis not present

## 2023-10-17 DIAGNOSIS — E785 Hyperlipidemia, unspecified: Secondary | ICD-10-CM | POA: Diagnosis not present

## 2023-10-17 DIAGNOSIS — E119 Type 2 diabetes mellitus without complications: Secondary | ICD-10-CM | POA: Diagnosis not present

## 2023-10-17 DIAGNOSIS — D649 Anemia, unspecified: Secondary | ICD-10-CM | POA: Diagnosis not present

## 2023-10-17 DIAGNOSIS — E538 Deficiency of other specified B group vitamins: Secondary | ICD-10-CM | POA: Diagnosis not present

## 2023-10-17 DIAGNOSIS — M6281 Muscle weakness (generalized): Secondary | ICD-10-CM | POA: Diagnosis not present

## 2023-10-20 DIAGNOSIS — H35011 Changes in retinal vascular appearance, right eye: Secondary | ICD-10-CM | POA: Diagnosis not present

## 2023-10-20 DIAGNOSIS — M6281 Muscle weakness (generalized): Secondary | ICD-10-CM | POA: Diagnosis not present

## 2023-10-20 DIAGNOSIS — H532 Diplopia: Secondary | ICD-10-CM | POA: Diagnosis not present

## 2023-10-20 DIAGNOSIS — G453 Amaurosis fugax: Secondary | ICD-10-CM | POA: Diagnosis not present

## 2023-10-20 DIAGNOSIS — H50331 Intermittent monocular exotropia, right eye: Secondary | ICD-10-CM | POA: Diagnosis not present

## 2023-10-21 DIAGNOSIS — M6281 Muscle weakness (generalized): Secondary | ICD-10-CM | POA: Diagnosis not present

## 2023-10-22 DIAGNOSIS — M6281 Muscle weakness (generalized): Secondary | ICD-10-CM | POA: Diagnosis not present

## 2023-10-23 ENCOUNTER — Ambulatory Visit: Admitting: Student in an Organized Health Care Education/Training Program

## 2023-10-23 DIAGNOSIS — M6281 Muscle weakness (generalized): Secondary | ICD-10-CM | POA: Diagnosis not present

## 2023-10-27 ENCOUNTER — Ambulatory Visit: Admitting: Student in an Organized Health Care Education/Training Program

## 2023-10-27 ENCOUNTER — Encounter: Payer: Self-pay | Admitting: Student in an Organized Health Care Education/Training Program

## 2023-10-27 VITALS — BP 112/80 | HR 95 | Temp 97.6°F | Ht <= 58 in | Wt 184.4 lb

## 2023-10-27 DIAGNOSIS — G4733 Obstructive sleep apnea (adult) (pediatric): Secondary | ICD-10-CM

## 2023-10-27 DIAGNOSIS — C3490 Malignant neoplasm of unspecified part of unspecified bronchus or lung: Secondary | ICD-10-CM | POA: Diagnosis not present

## 2023-10-27 DIAGNOSIS — J4540 Moderate persistent asthma, uncomplicated: Secondary | ICD-10-CM

## 2023-10-27 DIAGNOSIS — C3491 Malignant neoplasm of unspecified part of right bronchus or lung: Secondary | ICD-10-CM

## 2023-10-27 DIAGNOSIS — J42 Unspecified chronic bronchitis: Secondary | ICD-10-CM

## 2023-10-27 DIAGNOSIS — J4530 Mild persistent asthma, uncomplicated: Secondary | ICD-10-CM

## 2023-10-27 DIAGNOSIS — Z87891 Personal history of nicotine dependence: Secondary | ICD-10-CM

## 2023-10-27 DIAGNOSIS — G4700 Insomnia, unspecified: Secondary | ICD-10-CM | POA: Diagnosis not present

## 2023-10-27 DIAGNOSIS — G8921 Chronic pain due to trauma: Secondary | ICD-10-CM | POA: Diagnosis not present

## 2023-10-27 MED ORDER — ALBUTEROL SULFATE HFA 108 (90 BASE) MCG/ACT IN AERS: 2.0000 | INHALATION_SPRAY | Freq: Four times a day (QID) | RESPIRATORY_TRACT | 12 refills | Status: AC | PRN

## 2023-10-27 NOTE — Progress Notes (Signed)
 Assessment & Plan:   #Chronic bronchitis #Moderate persistent asthma without complication (Primary) #OSA (obstructive sleep apnea) #Small Cell Lung Cancer s/p chemoradiation   Presenting for follow up of cough/wheeze in the setting of history of small cell lung cancer treated with chemoradiation as well as history of COPD and asthma. PFT's show findings consistent with PRiSM  (preserved ratio, impaired spirometry) with no reversibility of obstruction following albuterol . She did have a repeat chest CT following our last visit that did not show any signs of tumor recurrence, but does show radiation changes in the right hemithorax.   I suspect that the patient has COPD/asthma overlap syndrome compounded by the effects of her malignancy as well as radiation changes. Her symptoms improved following initiation of triple therapy with Trelegy, which she will continue. Her max eosinophil count was 400 in the past and I suspect she will continue to benefit from continued ICS. She is also maintained on montelukast .    Finally, given her concern for sleep apnea, body habitus, and snoring, sleep study was obtained and was positive. We initiated CPAP but she did not tolerate it well. Discussed at length with patient, and offered referral to sleep specialist.  We will attempt to schedule her for a follow up visit with Dr. Jess from sleep medicine.  - albuterol  (VENTOLIN  HFA) 108 (90 Base) MCG/ACT inhaler; Inhale 2 puffs into the lungs every 6 (six) hours as needed for wheezing or shortness of breath.  Dispense: 8 g; Refill: 12 - Continue Trelegy Ellipta  - Does not qualify for ambulatory oxygen  concentrator - Establish care with sleep medicine - No further role for repeat chest CT  Return in about 1 year (around 10/26/2024).  I spent 30 minutes caring for this patient today, including preparing to see the patient, obtaining a medical history , reviewing a separately obtained history, performing a  medically appropriate examination and/or evaluation, counseling and educating the patient/family/caregiver, ordering medications, tests, or procedures, documenting clinical information in the electronic health record, and independently interpreting results (not separately reported/billed) and communicating results to the patient/family/caregiver  Belva November, MD [facility]   End of visit medications:  Meds ordered this encounter  Medications   albuterol  (VENTOLIN  HFA) 108 (90 Base) MCG/ACT inhaler    Sig: Inhale 2 puffs into the lungs every 6 (six) hours as needed for wheezing or shortness of breath.    Dispense:  8 g    Refill:  12     Current Outpatient Medications:    albuterol  (PROVENTIL ) (2.5 MG/3ML) 0.083% nebulizer solution, Take 2.5 mg by nebulization every 6 (six) hours., Disp: , Rfl:    albuterol  (VENTOLIN  HFA) 108 (90 Base) MCG/ACT inhaler, Inhale 2 puffs into the lungs every 6 (six) hours as needed for wheezing or shortness of breath., Disp: 8 g, Rfl: 12   aspirin  EC 81 MG tablet, Take 1 tablet (81 mg total) by mouth daily. Swallow whole., Disp: 30 tablet, Rfl: 12   busPIRone  (BUSPAR ) 5 MG tablet, Take 5 mg by mouth 3 (three) times daily., Disp: , Rfl:    Cholecalciferol (VITAMIN D) 50 MCG (2000 UT) CAPS, Take 1,000 Units by mouth daily., Disp: , Rfl:    clopidogrel  (PLAVIX ) 75 MG tablet, Take 1 tablet (75 mg total) by mouth daily., Disp: 90 tablet, Rfl: 4   dapagliflozin  propanediol (FARXIGA ) 10 MG TABS tablet, Take 1 tablet (10 mg total) by mouth daily before breakfast., Disp: 30 tablet, Rfl: 3   diclofenac  Sodium (VOLTAREN ) 1 %  GEL, Apply 4 g topically 4 (four) times daily., Disp: 350 g, Rfl: 3   econazole nitrate 1 % cream, Apply 1 Application topically 2 (two) times daily as needed., Disp: , Rfl:    ezetimibe  (ZETIA ) 10 MG tablet, Take 10 mg by mouth daily., Disp: , Rfl:    Fluticasone -Umeclidin-Vilant (TRELEGY ELLIPTA ) 200-62.5-25 MCG/ACT AEPB,  Inhale 1 puff into the lungs daily., Disp: 30 each, Rfl: 12   gabapentin  (NEURONTIN ) 300 MG capsule, Take 300 mg by mouth 2 (two) times daily., Disp: , Rfl:    hydrocortisone cream 1 %, Apply 1 Application topically every 6 (six) hours as needed for itching., Disp: , Rfl:    ketoconazole  (NIZORAL ) 2 % shampoo, Apply 1 Application topically 2 (two) times a week. Apply to scalp twice a week during shower on Wednesday and Saturday. Lather and allow to sit for 5 minutes before rinsing., Disp: , Rfl:    levothyroxine  (SYNTHROID ) 88 MCG tablet, Take 88 mcg by mouth daily., Disp: , Rfl:    loperamide (IMODIUM) 2 MG capsule, Take by mouth as needed for diarrhea or loose stools., Disp: , Rfl:    Magnesium 400 MG TABS, Take 1 tablet by mouth 2 (two) times daily., Disp: , Rfl:    metFORMIN  (GLUCOPHAGE ) 500 MG tablet, Take 500 mg by mouth 2 (two) times daily with a meal., Disp: , Rfl:    metoprolol  succinate (TOPROL  XL) 100 MG 24 hr tablet, Take 1 tablet (100 mg total) by mouth daily. Take with or immediately following a meal., Disp: 30 tablet, Rfl: 11   omeprazole  (PRILOSEC) 40 MG capsule, Take 1 capsule (40 mg total) by mouth 2 (two) times daily. Take in the morning and at dinnertime., Disp: 120 capsule, Rfl: 0   oxyCODONE -acetaminophen  (PERCOCET) 10-325 MG tablet, Take 1 tablet by mouth 4 (four) times daily as needed., Disp: , Rfl:    senna (SENOKOT) 8.6 MG TABS tablet, Take 2 tablets by mouth daily., Disp: , Rfl:    sertraline  (ZOLOFT ) 50 MG tablet, Take 1 tablet (50 mg total) by mouth daily., Disp: 90 tablet, Rfl: 4   torsemide  (DEMADEX ) 10 MG tablet, Take 1 tablet (10 mg total) by mouth daily., Disp: 30 tablet, Rfl: 11   vitamin B-12 (CYANOCOBALAMIN ) 1000 MCG tablet, Take 1 tablet (1,000 mcg total) by mouth daily., Disp: 30 tablet, Rfl: 0   zaleplon  (SONATA ) 5 MG capsule, Take 5 mg by mouth at bedtime., Disp: , Rfl:    nystatin (MYCOSTATIN/NYSTOP) powder, Apply topically 3 (three) times daily., Disp: ,  Rfl:    ondansetron  (ZOFRAN ) 4 MG tablet, Take 4 mg by mouth every 6 (six) hours as needed for nausea. (Patient not taking: Reported on 10/27/2023), Disp: , Rfl:    Subjective:   PATIENT ID: Yvette Riley GENDER: female DOB: Mar 18, 1955, MRN: [id number]  Chief Complaint  Patient presents with   Asthma    Shortness of breath on exertion and at rest. Cough and wheezing.     HPI  Patient is a 68 year old female presenting for follow up.  She has a history of small cell lung cancer previously treated with chemoradiation, now in remission, previously followed by Dr. Jacobo from oncology.  Given stability on her CT scans, she was discharged from oncology clinic.  She was initially referred to us  for the evaluation of cough and shortness of breath that are chronic but persistent.  She was previously told she has asthma as well as COPD, and started on inhalers.  She is currently on Trelegy, 1 puff once daily, with good results.  She has not had her cough since her diagnosis with malignancy and radiation therapy.  Patient was told she had asthma growing up and has had multiple inhalers over the years.  Return Visit 06/13/2023:   She was last seen in clinic in March 2025 at which point we continued Trelegy Ellipta  and referred her to sleep medicine given her inability to tolerate CPAP.  She is at baseline with continued symptoms of cough as well as exertional dyspnea that are unchanged compared to prior.  She did get a chest x-ray at her skilled nursing facility which was noted for a soft tissue fullness in the right hilar area with increased pulmonary markings in the right lung for which an acute visit was scheduled.   Patient feels that her respiratory symptoms are at baseline and unchanged since her radiation therapy.  She continues to cough and has rhonchorous sounds.  She is endorsing right upper extremity swelling as well as redness which has been ongoing for a couple of weeks.  She does not have  any fevers or chills, denies any night sweats, denies any weight loss.  She's not had any exacerbations since our last visit. She did get hospitalized in December of 2024 following a fall and was noted to have NSTEMI s/p LHC with angioplasty.  Return Visit 10/27/2023:  Continues to experience exertional dyspnea, and is using oxygen  therapy at home. She has a chronic cough. She is compliant with her Trelegy inhaler which she feels brings her relief. She has not had an exacerbation since our last visit. She did have her repeat chest CT which was re-assuring, as well as a duplex ultrasound that did not show any DVT. She continues to reside at a nursing home.     She started smoking at a very young age (67) and quit in 2013. Patient currently resides at a nursing facility. She is a non-smoker. She does not have any current exposures.  Ancillary information including prior medications, full medical/surgical/family/social histories, and PFTs (when available) are listed below and have been reviewed.   Review of Systems  Constitutional:  Negative for chills, fever and weight loss.  Respiratory:  Positive for cough and shortness of breath. Negative for wheezing.   Cardiovascular:  Negative for chest pain.     Objective:   Vitals:   10/27/23 1330  BP: 112/80  Pulse: 95  Temp: 97.6 F (36.4 C)  TempSrc: Temporal  SpO2: 96%  Weight: 184 lb 6.4 oz (83.6 kg)  Height: 4' 9 (1.448 m)   96% on RA. Trended on room air, oxygenation maintained at 94-95%. Patient stopped the walking secondary to fatigue and leg weakness.  BMI Readings from Last 3 Encounters:  10/27/23 39.90 kg/m  09/12/23 40.47 kg/m  08/04/23 40.25 kg/m   Wt Readings from Last 3 Encounters:  10/27/23 184 lb 6.4 oz (83.6 kg)  09/12/23 187 lb (84.8 kg)  08/04/23 186 lb (84.4 kg)    Physical Exam Constitutional:      General: She is not in acute distress.    Appearance: Normal appearance. She is not ill-appearing.   Cardiovascular:     Rate and Rhythm: Normal rate and regular rhythm.     Pulses: Normal pulses.     Heart sounds: Normal heart sounds.  Pulmonary:     Effort: Pulmonary effort is normal. No respiratory distress.     Breath sounds: Normal breath sounds. No wheezing or  rhonchi.  Abdominal:     Palpations: Abdomen is soft.  Neurological:     Mental Status: She is alert. Mental status is at baseline.       Ancillary Information    Past Medical History:  Diagnosis Date   Allergy    Anemia    Anxiety    Arthritis    Asthma    Blood transfusion without reported diagnosis    C. difficile diarrhea 04/07/2016   CAP (community acquired pneumonia) 03/21/2015   Combined forms of age-related cataract of both eyes 05/06/2016   COPD (chronic obstructive pulmonary disease) (HCC)    CVA (cerebral vascular accident) (HCC) 06/17/2018   Diabetes mellitus without complication (HCC)    Displacement of lumbar intervertebral disc without myelopathy    Emphysema of lung (HCC)    GERD (gastroesophageal reflux disease)    Glaucoma    History of chicken pox    Hyperlipidemia    Hypertension    NSTEMI (non-ST elevated myocardial infarction) (HCC) 01/07/2023   Pneumonia 03/29/2015   Sepsis (HCC) 03/17/2016   Shortness of breath    Small cell lung cancer (HCC)      Family History  Problem Relation Age of Onset   Heart failure Mother    Heart disease Mother    Stroke Mother    Heart disease Brother    COPD Brother    Cancer Maternal Aunt        Breast Cancer     Past Surgical History:  Procedure Laterality Date   BALLOON DILATION  11/20/2022   Procedure: BALLOON DILATION;  Surgeon: Aundria, Ladell POUR, MD;  Location: [facility] ENDOSCOPY;  Service: Gastroenterology;;   BIOPSY  11/20/2022   Procedure: BIOPSY;  Surgeon: Toledo, Ladell POUR, MD;  Location: [facility] ENDOSCOPY;  Service: Gastroenterology;;   CESAREAN SECTION     CORONARY STENT INTERVENTION N/A 01/08/2023   Procedure: CORONARY STENT  INTERVENTION;  Surgeon: Florencio Cara BIRCH, MD;  Location: [facility] INVASIVE CV LAB;  Service: Cardiovascular;  Laterality: N/A;   ESOPHAGOGASTRODUODENOSCOPY N/A 05/17/2022   Procedure: ESOPHAGOGASTRODUODENOSCOPY (EGD);  Surgeon: Jinny Carmine, MD;  Location: [facility] ENDOSCOPY;  Service: Endoscopy;  Laterality: N/A;   ESOPHAGOGASTRODUODENOSCOPY N/A 07/10/2022   Procedure: ESOPHAGOGASTRODUODENOSCOPY (EGD);  Surgeon: Toledo, Ladell POUR, MD;  Location: [facility] ENDOSCOPY;  Service: Gastroenterology;  Laterality: N/A;   ESOPHAGOGASTRODUODENOSCOPY (EGD) WITH PROPOFOL  N/A 02/08/2022   Procedure: ESOPHAGOGASTRODUODENOSCOPY (EGD) WITH PROPOFOL ;  Surgeon: Therisa Bi, MD;  Location: [facility] ENDOSCOPY;  Service: Gastroenterology;  Laterality: N/A;   ESOPHAGOGASTRODUODENOSCOPY (EGD) WITH PROPOFOL  N/A 11/20/2022   Procedure: ESOPHAGOGASTRODUODENOSCOPY (EGD) WITH PROPOFOL ;  Surgeon: Toledo, Ladell POUR, MD;  Location: [facility] ENDOSCOPY;  Service: Gastroenterology;  Laterality: N/A;   ESOPHAGOGASTRODUODENOSCOPY (EGD) WITH PROPOFOL  N/A 12/25/2022   Procedure: ESOPHAGOGASTRODUODENOSCOPY (EGD) WITH PROPOFOL ;  Surgeon: Unk Corinn Skiff, MD;  Location: [facility] ENDOSCOPY;  Service: Gastroenterology;  Laterality: N/A;   LEFT HEART CATH AND CORONARY ANGIOGRAPHY N/A 01/08/2023   Procedure: LEFT HEART CATH AND CORONARY ANGIOGRAPHY;  Surgeon: Fernand Denyse LABOR, MD;  Location: [facility] INVASIVE CV LAB;  Service: Cardiovascular;  Laterality: N/A;   TUBAL LIGATION     UPPER GI ENDOSCOPY      Social History   Socioeconomic History   Marital status: Widowed    Spouse name: Not on file   Number of children: 2   Years of education: Not on file   Highest education level: 10th grade  Occupational History   Occupation: disable  Tobacco Use   Smoking status: Former    Current  packs/day: 0.00    Average packs/day: 0.3 packs/day for 45.0 years (11.3 ttl pk-yrs)    Types: Cigarettes    Start date: 03/18/1971    Quit date: 03/17/2016    Years since  quitting: 7.6   Smokeless tobacco: Never   Tobacco comments:    pt states she quit in 2016-2017  Vaping Use   Vaping status: Never Used  Substance and Sexual Activity   Alcohol use: No    Alcohol/week: 0.0 standard drinks of alcohol   Drug use: No   Sexual activity: Not Currently    Birth control/protection: Abstinence  Other Topics Concern   Not on file  Social History Narrative   Not on file   Social Drivers of Health   Financial Resource Strain: Low Risk  (09/05/2021)   Overall Financial Resource Strain (CARDIA)    Difficulty of Paying Living Expenses: Not hard at all  Food Insecurity: No Food Insecurity (01/08/2023)   Hunger Vital Sign    Worried About Running Out of Food in the Last Year: Never true    Ran Out of Food in the Last Year: Never true  Transportation Needs: No Transportation Needs (01/08/2023)   PRAPARE - Administrator, Civil Service (Medical): No    Lack of Transportation (Non-Medical): No  Physical Activity: Inactive (09/05/2021)   Exercise Vital Sign    Days of Exercise per Week: 0 days    Minutes of Exercise per Session: 0 min  Stress: Stress Concern Present (09/05/2021)   [employer] of Occupational Health - Occupational Stress Questionnaire    Feeling of Stress : Very much  Social Connections: Unknown (06/01/2021)   Received from [facility]   Social Network    Social Network: Not on file  Intimate Partner Violence: Not At Risk (01/08/2023)   Humiliation, Afraid, Rape, and Kick questionnaire    Fear of Current or Ex-Partner: No    Emotionally Abused: No    Physically Abused: No    Sexually Abused: No     Allergies  Allergen Reactions   Other Anaphylaxis   Penicillins Anaphylaxis, Rash and Other (See Comments)    Reaction:  Tongue swelling  Has patient had a PCN reaction causing immediate rash, facial/tongue/throat swelling, SOB or lightheadedness with hypotension:  Yes   Has patient had a PCN reaction causing severe rash  involving mucus membranes or skin necrosis: No Has patient had a PCN reaction that required hospitalization No Has patient had a PCN reaction occurring within the last 10 years: No If all of the above answers are NO, then may proceed with Cephalosporin use.   Yellow Jacket Venom [Bee Venom] Anaphylaxis   Codeine Hives   Crestor [Rosuvastatin] Nausea And Vomiting    Corrected prior adverse reaction per [facility] [employer]. On 12/02/3011  patient reported N & V when she takes Crestor   Gemfibrozil  Rash, Nausea And Vomiting and Swelling   Trazodone And Nefazodone Nausea And Vomiting   Lipitor [Atorvastatin] Nausea Only    By patient report 12/02/2011. Had also been prescribed pravastatin and lovastatin by previous MD. Unclear if those caused same side effects.      CBC    Component Value Date/Time   WBC 6.7 01/13/2023 0636   RBC 3.95 01/13/2023 0636   HGB 10.4 (L) 01/13/2023 0636   HGB 12.1 05/09/2021 1342   HCT 33.2 (L) 01/13/2023 0636   HCT 35.1 05/09/2021 1342   PLT 232 01/13/2023 0636   PLT 325 05/09/2021 1342  MCV 84.1 01/13/2023 0636   MCV 106 (H) 05/09/2021 1342   MCV 96 02/02/2012 1114   MCH 26.3 01/13/2023 0636   MCHC 31.3 01/13/2023 0636   RDW 14.3 01/13/2023 0636   RDW 16.9 (H) 05/09/2021 1342   RDW 14.2 02/02/2012 1114   LYMPHSABS 1.3 01/11/2023 0838   LYMPHSABS 1.1 05/09/2021 1342   MONOABS 0.4 01/11/2023 0838   EOSABS 0.4 01/11/2023 0838   EOSABS 0.4 05/09/2021 1342   BASOSABS 0.0 01/11/2023 0838   BASOSABS 0.0 05/09/2021 1342    Pulmonary Functions Testing Results:    Latest Ref Rng & Units 10/22/2022    2:37 PM  PFT Results  FVC-Pre L 1.55   FVC-Predicted Pre % 66   FVC-Post L 1.63   FVC-Predicted Post % 70   Pre FEV1/FVC % % 75   Post FEV1/FCV % % 72   FEV1-Pre L 1.16   FEV1-Predicted Pre % 65   FEV1-Post L 1.18   DLCO uncorrected ml/min/mmHg 9.36   DLCO UNC% % 60   DLVA Predicted % 79   TLC L 3.49   TLC % Predicted % 87   RV % Predicted % 104      Outpatient Medications Prior to Visit  Medication Sig Dispense Refill   albuterol  (PROVENTIL ) (2.5 MG/3ML) 0.083% nebulizer solution Take 2.5 mg by nebulization every 6 (six) hours.     aspirin  EC 81 MG tablet Take 1 tablet (81 mg total) by mouth daily. Swallow whole. 30 tablet 12   busPIRone  (BUSPAR ) 5 MG tablet Take 5 mg by mouth 3 (three) times daily.     Cholecalciferol (VITAMIN D) 50 MCG (2000 UT) CAPS Take 1,000 Units by mouth daily.     clopidogrel  (PLAVIX ) 75 MG tablet Take 1 tablet (75 mg total) by mouth daily. 90 tablet 4   dapagliflozin  propanediol (FARXIGA ) 10 MG TABS tablet Take 1 tablet (10 mg total) by mouth daily before breakfast. 30 tablet 3   diclofenac  Sodium (VOLTAREN ) 1 % GEL Apply 4 g topically 4 (four) times daily. 350 g 3   econazole nitrate 1 % cream Apply 1 Application topically 2 (two) times daily as needed.     ezetimibe  (ZETIA ) 10 MG tablet Take 10 mg by mouth daily.     Fluticasone -Umeclidin-Vilant (TRELEGY ELLIPTA ) 200-62.5-25 MCG/ACT AEPB Inhale 1 puff into the lungs daily. 30 each 12   gabapentin  (NEURONTIN ) 300 MG capsule Take 300 mg by mouth 2 (two) times daily.     hydrocortisone cream 1 % Apply 1 Application topically every 6 (six) hours as needed for itching.     ketoconazole  (NIZORAL ) 2 % shampoo Apply 1 Application topically 2 (two) times a week. Apply to scalp twice a week during shower on Wednesday and Saturday. Lather and allow to sit for 5 minutes before rinsing.     levothyroxine  (SYNTHROID ) 88 MCG tablet Take 88 mcg by mouth daily.     loperamide (IMODIUM) 2 MG capsule Take by mouth as needed for diarrhea or loose stools.     Magnesium 400 MG TABS Take 1 tablet by mouth 2 (two) times daily.     metFORMIN  (GLUCOPHAGE ) 500 MG tablet Take 500 mg by mouth 2 (two) times daily with a meal.     metoprolol  succinate (TOPROL  XL) 100 MG 24 hr tablet Take 1 tablet (100 mg total) by mouth daily. Take with or immediately following a meal. 30 tablet 11    omeprazole  (PRILOSEC) 40 MG capsule Take 1 capsule (40 mg  total) by mouth 2 (two) times daily. Take in the morning and at dinnertime. 120 capsule 0   oxyCODONE -acetaminophen  (PERCOCET) 10-325 MG tablet Take 1 tablet by mouth 4 (four) times daily as needed.     senna (SENOKOT) 8.6 MG TABS tablet Take 2 tablets by mouth daily.     sertraline  (ZOLOFT ) 50 MG tablet Take 1 tablet (50 mg total) by mouth daily. 90 tablet 4   torsemide  (DEMADEX ) 10 MG tablet Take 1 tablet (10 mg total) by mouth daily. 30 tablet 11   vitamin B-12 (CYANOCOBALAMIN ) 1000 MCG tablet Take 1 tablet (1,000 mcg total) by mouth daily. 30 tablet 0   zaleplon  (SONATA ) 5 MG capsule Take 5 mg by mouth at bedtime.     nystatin (MYCOSTATIN/NYSTOP) powder Apply topically 3 (three) times daily.     ondansetron  (ZOFRAN ) 4 MG tablet Take 4 mg by mouth every 6 (six) hours as needed for nausea. (Patient not taking: Reported on 10/27/2023)     No facility-administered medications prior to visit.

## 2023-10-31 ENCOUNTER — Telehealth: Payer: Self-pay | Admitting: Sleep Medicine

## 2023-10-31 NOTE — Telephone Encounter (Signed)
 Attempted to contact patient and no voicemail. No mychart.

## 2023-11-05 NOTE — Telephone Encounter (Signed)
 Patient returned Kim's call to schedule an appointment.  Patient wanted to know what kind of appointment.  I called [facility] to ask.  Was told patient needed to schedule an appointment with a sleep doctor per Dr. Isadora.  Patient disconnected while I checked.  I called back but she is in a nursing facility and the front desk answered the call and said the patient does not schedule her own appointments.  They have a scheduler who will take care of all her appointments.  They will call back to get her scheduled.

## 2023-11-05 NOTE — Telephone Encounter (Signed)
 LVMTCB with facility to schedule appointment with Dr. Jess.

## 2023-11-11 NOTE — Progress Notes (Signed)
 Appt is scheduled for 10/22. Nothing further needed.

## 2023-11-12 DIAGNOSIS — M25559 Pain in unspecified hip: Secondary | ICD-10-CM | POA: Diagnosis not present

## 2023-11-12 DIAGNOSIS — G8929 Other chronic pain: Secondary | ICD-10-CM | POA: Diagnosis not present

## 2023-11-12 DIAGNOSIS — M160 Bilateral primary osteoarthritis of hip: Secondary | ICD-10-CM | POA: Diagnosis not present

## 2023-11-13 DIAGNOSIS — G8921 Chronic pain due to trauma: Secondary | ICD-10-CM | POA: Diagnosis not present

## 2023-11-18 DIAGNOSIS — R350 Frequency of micturition: Secondary | ICD-10-CM | POA: Diagnosis not present

## 2023-11-18 DIAGNOSIS — G894 Chronic pain syndrome: Secondary | ICD-10-CM | POA: Diagnosis not present

## 2023-11-19 ENCOUNTER — Encounter: Payer: Self-pay | Admitting: Sleep Medicine

## 2023-11-19 ENCOUNTER — Ambulatory Visit: Admitting: Sleep Medicine

## 2023-11-19 VITALS — BP 110/60 | HR 120 | Temp 99.0°F | Ht <= 58 in | Wt 184.0 lb

## 2023-11-19 DIAGNOSIS — G4733 Obstructive sleep apnea (adult) (pediatric): Secondary | ICD-10-CM

## 2023-11-19 DIAGNOSIS — J449 Chronic obstructive pulmonary disease, unspecified: Secondary | ICD-10-CM

## 2023-11-19 DIAGNOSIS — K5900 Constipation, unspecified: Secondary | ICD-10-CM | POA: Diagnosis not present

## 2023-11-19 DIAGNOSIS — I69351 Hemiplegia and hemiparesis following cerebral infarction affecting right dominant side: Secondary | ICD-10-CM | POA: Diagnosis not present

## 2023-11-19 DIAGNOSIS — M6281 Muscle weakness (generalized): Secondary | ICD-10-CM | POA: Diagnosis not present

## 2023-11-19 DIAGNOSIS — K219 Gastro-esophageal reflux disease without esophagitis: Secondary | ICD-10-CM | POA: Diagnosis not present

## 2023-11-19 NOTE — Progress Notes (Signed)
 Name:Yvette Riley MRN: [id number] DOB: Dec 24, 1955   CHIEF COMPLAINT:  REASSESSMENT OF OSA   HISTORY OF PRESENT ILLNESS: Yvette Riley is a 68 y.o. w/ a h/o OSA, CAD, COPD on 2 lpm O2, CVA, asthma, DMII, GERD, hypothyroidism, chronic back pain and obesity who presents for reassessment of OSA. Reports that she was initially diagnosed with OSA a few years ago and was subsequently started on CPAP therapy. States that she did not tolerate CPAP therapy due to mask and pressure discomfort.  Reports c/o excessive daytime sleepiness which has been present for a few years. Reports that Reports nocturnal awakenings due to nocturia, however does not have difficulty falling back to sleep. Reports significant weight changes. Admits to dry mouth. Denies morning headaches, RLS symptoms, dream enactment, cataplexy, hypnagogic or hypnapompic hallucinations. Denies a family history of sleep apnea. Denies drowsy driving. Drinks 2 sodas daily, denies alcohol, tobacco or illicit drug use.   Bedtime 9:30-10 pm Sleep onset 1 hour  Rise time 8 am   EPWORTH SLEEP SCORE 5    11/19/2023    8:00 AM 11/11/2022   11:00 AM  Results of the Epworth flowsheet  Sitting and reading 0 1  Watching TV 3 1  Sitting, inactive in a public place (e.g. a theatre or a meeting) 0 0  As a passenger in a car for an hour without a break 0 0  Lying down to rest in the afternoon when circumstances permit 2 2  Sitting and talking to someone 0 0  Sitting quietly after a lunch without alcohol 0 1  In a car, while stopped for a few minutes in traffic 0 0  Total score 5 5    PAST MEDICAL HISTORY :   has a past medical history of Allergy, Anemia, Anxiety, Arthritis, Asthma, Blood transfusion without reported diagnosis, C. difficile diarrhea (04/07/2016), CAP (community acquired pneumonia) (03/21/2015), Combined forms of age-related cataract of both eyes (05/06/2016), COPD (chronic obstructive pulmonary disease) (HCC), CVA  (cerebral vascular accident) (HCC) (06/17/2018), Diabetes mellitus without complication (HCC), Displacement of lumbar intervertebral disc without myelopathy, Emphysema of lung (HCC), GERD (gastroesophageal reflux disease), Glaucoma, History of chicken pox, Hyperlipidemia, Hypertension, NSTEMI (non-ST elevated myocardial infarction) (HCC) (01/07/2023), Pneumonia (03/29/2015), Sepsis (HCC) (03/17/2016), Shortness of breath, and Small cell lung cancer (HCC).  has a past surgical history that includes Cesarean section; Tubal ligation; Upper gi endoscopy; Esophagogastroduodenoscopy (egd) with propofol  (N/A, 02/08/2022); Esophagogastroduodenoscopy (N/A, 05/17/2022); Esophagogastroduodenoscopy (N/A, 07/10/2022); Esophagogastroduodenoscopy (egd) with propofol  (N/A, 11/20/2022); biopsy (11/20/2022); Balloon dilation (11/20/2022); Esophagogastroduodenoscopy (egd) with propofol  (N/A, 12/25/2022); LEFT HEART CATH AND CORONARY ANGIOGRAPHY (N/A, 01/08/2023); and CORONARY STENT INTERVENTION (N/A, 01/08/2023). Prior to Admission medications   Medication Sig Start Date End Date Taking? Authorizing Provider  albuterol  (PROVENTIL ) (2.5 MG/3ML) 0.083% nebulizer solution Take 2.5 mg by nebulization every 6 (six) hours.   Yes [provider]  albuterol  (VENTOLIN  HFA) 108 (90 Base) MCG/ACT inhaler Inhale 2 puffs into the lungs every 6 (six) hours as needed for wheezing or shortness of breath. 10/27/23  Yes Dgayli, Belva, MD  aspirin  EC 81 MG tablet Take 1 tablet (81 mg total) by mouth daily. Swallow whole. 01/15/23  Yes Maree Hue, MD  busPIRone  (BUSPAR ) 5 MG tablet Take 5 mg by mouth 3 (three) times daily.   Yes [provider]  Cholecalciferol (VITAMIN D) 50 MCG (2000 UT) CAPS Take 1,000 Units by mouth daily.   Yes [provider]  clopidogrel  (PLAVIX ) 75 MG tablet Take 1  tablet (75 mg total) by mouth daily. 07/08/23  Yes Fernand Denyse LABOR, MD  dapagliflozin  propanediol (FARXIGA ) 10 MG TABS tablet Take 1  tablet (10 mg total) by mouth daily before breakfast. 07/08/23  Yes Fernand Denyse LABOR, MD  diclofenac  Sodium (VOLTAREN ) 1 % GEL Apply 4 g topically 4 (four) times daily. 06/19/21  Yes Gasper Nancyann BRAVO, MD  econazole nitrate 1 % cream Apply 1 Application topically 2 (two) times daily as needed. 10/28/22  Yes [provider]  ezetimibe  (ZETIA ) 10 MG tablet Take 10 mg by mouth daily. 10/17/22  Yes [provider]  Fluticasone -Umeclidin-Vilant (TRELEGY ELLIPTA ) 200-62.5-25 MCG/ACT AEPB Inhale 1 puff into the lungs daily. 07/19/22  Yes Dgayli, Belva, MD  gabapentin  (NEURONTIN ) 300 MG capsule Take 300 mg by mouth 2 (two) times daily.   Yes [provider]  hydrocortisone cream 1 % Apply 1 Application topically every 6 (six) hours as needed for itching.   Yes [provider]  ketoconazole  (NIZORAL ) 2 % shampoo Apply 1 Application topically 2 (two) times a week. Apply to scalp twice a week during shower on Wednesday and Saturday. Lather and allow to sit for 5 minutes before rinsing.   Yes [provider]  levothyroxine  (SYNTHROID ) 88 MCG tablet Take 88 mcg by mouth daily.   Yes [provider]  loperamide (IMODIUM) 2 MG capsule Take by mouth as needed for diarrhea or loose stools.   Yes [provider]  Magnesium 400 MG TABS Take 1 tablet by mouth 2 (two) times daily.   Yes [provider]  metFORMIN  (GLUCOPHAGE ) 1000 MG tablet Take 1,000 mg by mouth 2 (two) times daily. 10/30/23  Yes [provider]  metoprolol  succinate (TOPROL  XL) 100 MG 24 hr tablet Take 1 tablet (100 mg total) by mouth daily. Take with or immediately following a meal. 03/10/23 03/09/24 Yes Fernand Denyse A, MD  nystatin (MYCOSTATIN/NYSTOP) powder Apply topically 3 (three) times daily. 08/04/23  Yes [provider]  omeprazole  (PRILOSEC) 40 MG capsule Take 1 capsule (40 mg total) by mouth 2 (two) times daily. Take in the morning and at dinnertime. 02/09/22  Yes  Alexander, Natalie, DO  oxyCODONE -acetaminophen  (PERCOCET) 10-325 MG tablet Take 1 tablet by mouth 4 (four) times daily as needed. 05/05/23  Yes [provider]  senna (SENOKOT) 8.6 MG TABS tablet Take 2 tablets by mouth daily. 01/31/22  Yes [provider]  sertraline  (ZOLOFT ) 50 MG tablet Take 1 tablet (50 mg total) by mouth daily. 06/15/21  Yes Gasper Nancyann BRAVO, MD  torsemide  (DEMADEX ) 10 MG tablet Take 1 tablet (10 mg total) by mouth daily. 07/08/23 07/07/24 Yes Fernand Denyse LABOR, MD  vitamin B-12 (CYANOCOBALAMIN ) 1000 MCG tablet Take 1 tablet (1,000 mcg total) by mouth daily. 05/23/21  Yes Ostwalt, Janna, PA-C  zaleplon  (SONATA ) 5 MG capsule Take 5 mg by mouth at bedtime.   Yes [provider]  ondansetron  (ZOFRAN ) 4 MG tablet Take 4 mg by mouth every 6 (six) hours as needed for nausea. Patient not taking: Reported on 11/19/2023 02/05/22   [provider]   Allergies  Allergen Reactions   Other Anaphylaxis   Penicillins Anaphylaxis, Rash and Other (See Comments)    Reaction:  Tongue swelling  Has patient had a PCN reaction causing immediate rash, facial/tongue/throat swelling, SOB or lightheadedness with hypotension:  Yes   Has patient had a PCN reaction causing severe rash involving mucus membranes or skin necrosis: No Has patient had a PCN reaction that  required hospitalization No Has patient had a PCN reaction occurring within the last 10 years: No If all of the above answers are NO, then may proceed with Cephalosporin use.   Yellow Jacket Venom [Bee Venom] Anaphylaxis   Codeine Hives   Crestor [Rosuvastatin] Nausea And Vomiting    Corrected prior adverse reaction per [facility] [employer]. On 12/02/3011  patient reported N & V when she takes Crestor   Gemfibrozil  Rash, Nausea And Vomiting and Swelling   Trazodone And Nefazodone Nausea And Vomiting   Lipitor [Atorvastatin] Nausea Only    By patient report 12/02/2011. Had also been prescribed pravastatin and  lovastatin by previous MD. Unclear if those caused same side effects.     FAMILY HISTORY:  family history includes COPD in her brother; Cancer in her maternal aunt; Heart disease in her brother and mother; Heart failure in her mother; Stroke in her mother. SOCIAL HISTORY:  reports that she quit smoking about 7 years ago. Her smoking use included cigarettes. She started smoking about 52 years ago. She has a 11.3 pack-year smoking history. She has never used smokeless tobacco. She reports that she does not drink alcohol and does not use drugs.   Review of Systems:  Gen:  Denies  fever, sweats, chills weight loss  HEENT: Denies blurred vision, double vision, ear pain, eye pain, hearing loss, nose bleeds, sore throat Cardiac:  No dizziness, chest pain or heaviness, chest tightness,edema, No JVD Resp:   No cough, -sputum production, -shortness of breath,-wheezing, -hemoptysis,  Gi: Denies swallowing difficulty, stomach pain, nausea or vomiting, diarrhea, constipation, bowel incontinence Gu:  Denies bladder incontinence, burning urine Ext:   Denies Joint pain, stiffness or swelling Skin: Denies  skin rash, easy bruising or bleeding or hives Endoc:  Denies polyuria, polydipsia , polyphagia or weight change Psych:   Denies depression, insomnia or hallucinations  Other:  All other systems negative  VITAL SIGNS: BP 110/60   Pulse (!) 120   Temp 99 F (37.2 C)   Ht 4' 9 (1.448 m)   Wt 184 lb (83.5 kg)   SpO2 94%   BMI 39.82 kg/m    Physical Examination:   General Appearance: No distress  EYES PERRLA, EOM intact.   NECK Supple, No JVD Pulmonary: normal breath sounds, No wheezing.  CardiovascularNormal S1,S2.  No m/r/g.   Abdomen: Benign, Soft, non-tender. Skin:   warm, no rashes, no ecchymosis  Extremities: normal, no cyanosis, clubbing. Neuro:without focal findings,  speech normal  PSYCHIATRIC: Mood, affect within normal limits.   ASSESSMENT AND PLAN  OSA Will complete  reassessment of OSA with in lab sleep study. Discussed the consequences of untreated sleep apnea. Advised not to drive drowsy for safety of patient and others. Will complete further evaluation with a split night study and follow up to review results.    COPD Stable, on current management. Following with Dr. Isadora.    MEDICATION ADJUSTMENTS/LABS AND TESTS ORDERED: Recommend Sleep Study   Patient  satisfied with Plan of action and management. All questions answered  Follow up to review PSG results and treatment plan.   I spent a total of 52 minutes reviewing chart data, face-to-face evaluation with the patient, counseling and coordination of care as detailed above.    Aidan Caloca, M.D.  Sleep Medicine [facility]

## 2023-11-21 DIAGNOSIS — G8921 Chronic pain due to trauma: Secondary | ICD-10-CM | POA: Diagnosis not present

## 2023-12-15 ENCOUNTER — Ambulatory Visit: Payer: Self-pay | Admitting: Cardiovascular Disease

## 2023-12-15 ENCOUNTER — Encounter: Payer: Self-pay | Admitting: Cardiovascular Disease

## 2023-12-15 ENCOUNTER — Ambulatory Visit (INDEPENDENT_AMBULATORY_CARE_PROVIDER_SITE_OTHER): Admitting: Cardiovascular Disease

## 2023-12-15 VITALS — BP 110/72 | HR 97 | Ht <= 58 in | Wt 180.8 lb

## 2023-12-15 DIAGNOSIS — I214 Non-ST elevation (NSTEMI) myocardial infarction: Secondary | ICD-10-CM

## 2023-12-15 DIAGNOSIS — R0789 Other chest pain: Secondary | ICD-10-CM | POA: Diagnosis not present

## 2023-12-15 DIAGNOSIS — I25118 Atherosclerotic heart disease of native coronary artery with other forms of angina pectoris: Secondary | ICD-10-CM | POA: Insufficient documentation

## 2023-12-15 DIAGNOSIS — R0602 Shortness of breath: Secondary | ICD-10-CM

## 2023-12-15 DIAGNOSIS — I5033 Acute on chronic diastolic (congestive) heart failure: Secondary | ICD-10-CM

## 2023-12-15 DIAGNOSIS — I4711 Inappropriate sinus tachycardia, so stated: Secondary | ICD-10-CM | POA: Diagnosis not present

## 2023-12-15 DIAGNOSIS — Z0130 Encounter for examination of blood pressure without abnormal findings: Secondary | ICD-10-CM

## 2023-12-15 NOTE — Progress Notes (Signed)
 Cardiology Office Note   Date:  12/15/2023   ID:  Yvette Riley, DOB 1955-02-04, MRN [id number]  PCP:  Andi Jointer, DO  Cardiologist:  Denyse Bathe, MD      History of Present Illness: Yvette Riley is a 68 y.o. female who presents for  Chief Complaint  Patient presents with   Follow-up    3 month follow up    Patient is a 68 year old white female with a history of PCI/stenting of the mid LAD after having a non-STEMI.  Patient had severe 70% lesion in OM and was going to have a staged PCI.  She was placed on medical treatment and had a follow-up stress test twice but was unable to get it done.  She is having shortness of breath and requiring oxygen  and other symptoms of heart failure.  Will proceed with cardiac catheterization and PCI of the OM.      Past Medical History:  Diagnosis Date   Allergy    Anemia    Anxiety    Arthritis    Asthma    Blood transfusion without reported diagnosis    C. difficile diarrhea 04/07/2016   CAP (community acquired pneumonia) 03/21/2015   Combined forms of age-related cataract of both eyes 05/06/2016   COPD (chronic obstructive pulmonary disease) (HCC)    CVA (cerebral vascular accident) (HCC) 06/17/2018   Diabetes mellitus without complication (HCC)    Displacement of lumbar intervertebral disc without myelopathy    Emphysema of lung (HCC)    GERD (gastroesophageal reflux disease)    Glaucoma    History of chicken pox    Hyperlipidemia    Hypertension    NSTEMI (non-ST elevated myocardial infarction) (HCC) 01/07/2023   Pneumonia 03/29/2015   Sepsis (HCC) 03/17/2016   Shortness of breath    Small cell lung cancer (HCC)      Past Surgical History:  Procedure Laterality Date   BALLOON DILATION  11/20/2022   Procedure: BALLOON DILATION;  Surgeon: Aundria, Ladell POUR, MD;  Location: [facility] ENDOSCOPY;  Service: Gastroenterology;;   BIOPSY  11/20/2022   Procedure: BIOPSY;  Surgeon: Toledo, Ladell POUR, MD;  Location: [facility]  ENDOSCOPY;  Service: Gastroenterology;;   CESAREAN SECTION     CORONARY STENT INTERVENTION N/A 01/08/2023   Procedure: CORONARY STENT INTERVENTION;  Surgeon: Florencio Cara BIRCH, MD;  Location: [facility] INVASIVE CV LAB;  Service: Cardiovascular;  Laterality: N/A;   ESOPHAGOGASTRODUODENOSCOPY N/A 05/17/2022   Procedure: ESOPHAGOGASTRODUODENOSCOPY (EGD);  Surgeon: Jinny Carmine, MD;  Location: [facility] ENDOSCOPY;  Service: Endoscopy;  Laterality: N/A;   ESOPHAGOGASTRODUODENOSCOPY N/A 07/10/2022   Procedure: ESOPHAGOGASTRODUODENOSCOPY (EGD);  Surgeon: Toledo, Ladell POUR, MD;  Location: [facility] ENDOSCOPY;  Service: Gastroenterology;  Laterality: N/A;   ESOPHAGOGASTRODUODENOSCOPY (EGD) WITH PROPOFOL  N/A 02/08/2022   Procedure: ESOPHAGOGASTRODUODENOSCOPY (EGD) WITH PROPOFOL ;  Surgeon: Therisa Bi, MD;  Location: [facility] ENDOSCOPY;  Service: Gastroenterology;  Laterality: N/A;   ESOPHAGOGASTRODUODENOSCOPY (EGD) WITH PROPOFOL  N/A 11/20/2022   Procedure: ESOPHAGOGASTRODUODENOSCOPY (EGD) WITH PROPOFOL ;  Surgeon: Toledo, Ladell POUR, MD;  Location: [facility] ENDOSCOPY;  Service: Gastroenterology;  Laterality: N/A;   ESOPHAGOGASTRODUODENOSCOPY (EGD) WITH PROPOFOL  N/A 12/25/2022   Procedure: ESOPHAGOGASTRODUODENOSCOPY (EGD) WITH PROPOFOL ;  Surgeon: Unk Corinn Skiff, MD;  Location: [facility] ENDOSCOPY;  Service: Gastroenterology;  Laterality: N/A;   LEFT HEART CATH AND CORONARY ANGIOGRAPHY N/A 01/08/2023   Procedure: LEFT HEART CATH AND CORONARY ANGIOGRAPHY;  Surgeon: Bathe Denyse LABOR, MD;  Location: [facility] INVASIVE CV LAB;  Service: Cardiovascular;  Laterality: N/A;   TUBAL LIGATION  UPPER GI ENDOSCOPY       Current Outpatient Medications  Medication Sig Dispense Refill   albuterol  (PROVENTIL ) (2.5 MG/3ML) 0.083% nebulizer solution Take 2.5 mg by nebulization every 6 (six) hours.     albuterol  (VENTOLIN  HFA) 108 (90 Base) MCG/ACT inhaler Inhale 2 puffs into the lungs every 6 (six) hours as needed for wheezing or shortness of breath.  8 g 12   aspirin  EC 81 MG tablet Take 1 tablet (81 mg total) by mouth daily. Swallow whole. 30 tablet 12   busPIRone  (BUSPAR ) 5 MG tablet Take 5 mg by mouth 3 (three) times daily.     Cholecalciferol (VITAMIN D) 50 MCG (2000 UT) CAPS Take 1,000 Units by mouth daily.     clopidogrel  (PLAVIX ) 75 MG tablet Take 1 tablet (75 mg total) by mouth daily. 90 tablet 4   dapagliflozin  propanediol (FARXIGA ) 10 MG TABS tablet Take 1 tablet (10 mg total) by mouth daily before breakfast. 30 tablet 3   ezetimibe  (ZETIA ) 10 MG tablet Take 10 mg by mouth daily.     Fluticasone -Umeclidin-Vilant (TRELEGY ELLIPTA ) 200-62.5-25 MCG/ACT AEPB Inhale 1 puff into the lungs daily. 30 each 12   gabapentin  (NEURONTIN ) 300 MG capsule Take 300 mg by mouth 2 (two) times daily.     hydrocortisone cream 1 % Apply 1 Application topically every 6 (six) hours as needed for itching.     ketoconazole  (NIZORAL ) 2 % shampoo Apply 1 Application topically 2 (two) times a week. Apply to scalp twice a week during shower on Wednesday and Saturday. Lather and allow to sit for 5 minutes before rinsing.     levothyroxine  (SYNTHROID ) 88 MCG tablet Take 88 mcg by mouth daily.     loperamide (IMODIUM) 2 MG capsule Take by mouth as needed for diarrhea or loose stools.     Magnesium 400 MG TABS Take 1 tablet by mouth 2 (two) times daily.     metFORMIN  (GLUCOPHAGE ) 1000 MG tablet Take 1,000 mg by mouth 2 (two) times daily.     metoprolol  succinate (TOPROL  XL) 100 MG 24 hr tablet Take 1 tablet (100 mg total) by mouth daily. Take with or immediately following a meal. 30 tablet 11   nystatin (MYCOSTATIN/NYSTOP) powder Apply topically 3 (three) times daily.     omeprazole  (PRILOSEC) 40 MG capsule Take 1 capsule (40 mg total) by mouth 2 (two) times daily. Take in the morning and at dinnertime. 120 capsule 0   oxyCODONE -acetaminophen  (PERCOCET) 10-325 MG tablet Take 1 tablet by mouth 4 (four) times daily as needed.     senna (SENOKOT) 8.6 MG TABS tablet Take  2 tablets by mouth daily.     sertraline  (ZOLOFT ) 50 MG tablet Take 1 tablet (50 mg total) by mouth daily. 90 tablet 4   torsemide  (DEMADEX ) 10 MG tablet Take 1 tablet (10 mg total) by mouth daily. 30 tablet 11   vitamin B-12 (CYANOCOBALAMIN ) 1000 MCG tablet Take 1 tablet (1,000 mcg total) by mouth daily. 30 tablet 0   zaleplon  (SONATA ) 5 MG capsule Take 5 mg by mouth at bedtime.     diclofenac  Sodium (VOLTAREN ) 1 % GEL Apply 4 g topically 4 (four) times daily. (Patient not taking: Reported on 12/15/2023) 350 g 3   econazole nitrate 1 % cream Apply 1 Application topically 2 (two) times daily as needed. (Patient not taking: Reported on 12/15/2023)     ondansetron  (ZOFRAN ) 4 MG tablet Take 4 mg by mouth every 6 (six) hours as needed for  nausea. (Patient not taking: Reported on 12/15/2023)     No current facility-administered medications for this visit.    Allergies:   Other, Penicillins, Yellow jacket venom [bee venom], Codeine, Crestor [rosuvastatin], Gemfibrozil , Trazodone and nefazodone, and Lipitor [atorvastatin]    Social History:   reports that she quit smoking about 7 years ago. Her smoking use included cigarettes. She started smoking about 52 years ago. She has a 11.3 pack-year smoking history. She has never used smokeless tobacco. She reports that she does not drink alcohol and does not use drugs.   Family History:  family history includes COPD in her brother; Cancer in her maternal aunt; Heart disease in her brother and mother; Heart failure in her mother; Stroke in her mother.    ROS:     Review of Systems  Constitutional: Negative.   HENT: Negative.    Eyes: Negative.   Respiratory: Negative.    Gastrointestinal: Negative.   Genitourinary: Negative.   Musculoskeletal: Negative.   Skin: Negative.   Neurological: Negative.   Endo/Heme/Allergies: Negative.   Psychiatric/Behavioral: Negative.    All other systems reviewed and are negative.     All other systems are reviewed  and negative.    PHYSICAL EXAM: VS:  BP 110/72   Pulse 97   Ht 4' 9 (1.448 m)   Wt 180 lb 12.8 oz (82 kg)   SpO2 94%   BMI 39.12 kg/m  , BMI Body mass index is 39.12 kg/m. Last weight:  Wt Readings from Last 3 Encounters:  12/15/23 180 lb 12.8 oz (82 kg)  11/19/23 184 lb (83.5 kg)  10/27/23 184 lb 6.4 oz (83.6 kg)     Physical Exam Constitutional:      Appearance: Normal appearance.  Cardiovascular:     Rate and Rhythm: Normal rate and regular rhythm.     Heart sounds: Normal heart sounds.  Pulmonary:     Effort: Pulmonary effort is normal.     Breath sounds: Normal breath sounds.  Musculoskeletal:     Right lower leg: No edema.     Left lower leg: No edema.  Neurological:     Mental Status: She is alert.       EKG:   Recent Labs: 01/08/2023: TSH 7.022 01/13/2023: ALT 15; BUN 21; Hemoglobin 10.4; Platelets 232; Potassium 3.8; Sodium 136 06/25/2023: Creatinine, Ser 0.60    Lipid Panel    Component Value Date/Time   CHOL 167 01/08/2023 0328   CHOL 228 (H) 06/19/2020 1405   TRIG 561 (H) 01/08/2023 0328   HDL 31 (L) 01/08/2023 0328   HDL 35 (L) 06/19/2020 1405   CHOLHDL 5.4 01/08/2023 0328   VLDL UNABLE TO CALCULATE IF TRIGLYCERIDE OVER 400 mg/dL 87/88/7975 9671   LDLCALC UNABLE TO CALCULATE IF TRIGLYCERIDE OVER 400 mg/dL 87/88/7975 9671   LDLCALC 82 06/19/2020 1405   LDLDIRECT 81 01/08/2023 0328      Other studies Reviewed: Additional studies/ records that were reviewed today include:  Review of the above records demonstrates:       No data to display            ASSESSMENT AND PLAN:    ICD-10-CM   1. Inappropriate sinus tachycardia  I47.11 Basic metabolic panel with GFR    CBC with Differential/Platelet    Procedural/ Surgical Case Request: LEFT HEART CATH AND CORONARY ANGIOGRAPHY with possible coronary intervention    2. CHF (congestive heart failure), NYHA class III, acute on chronic, diastolic (HCC)  I50.33 Basic metabolic  panel with  GFR    CBC with Differential/Platelet    Procedural/ Surgical Case Request: LEFT HEART CATH AND CORONARY ANGIOGRAPHY with possible coronary intervention    3. SOB (shortness of breath)  R06.02 Basic metabolic panel with GFR    CBC with Differential/Platelet    Procedural/ Surgical Case Request: LEFT HEART CATH AND CORONARY ANGIOGRAPHY with possible coronary intervention    4. Other chest pain  R07.89 Basic metabolic panel with GFR    CBC with Differential/Platelet    Procedural/ Surgical Case Request: LEFT HEART CATH AND CORONARY ANGIOGRAPHY with possible coronary intervention    5. NSTEMI (non-ST elevated myocardial infarction) (HCC)  I21.4 Basic metabolic panel with GFR    CBC with Differential/Platelet    Procedural/ Surgical Case Request: LEFT HEART CATH AND CORONARY ANGIOGRAPHY with possible coronary intervention    6. Coronary artery disease of native artery of native heart with stable angina pectoris  I25.118 Basic metabolic panel with GFR    CBC with Differential/Platelet    Procedural/ Surgical Case Request: LEFT HEART CATH AND CORONARY ANGIOGRAPHY with possible coronary intervention   Stent in LAD, and needed staged OM disease. Advised stress test but could not be done yet. Set up stahed PCI pf OM.    7. Coronary artery disease of native artery of native heart with stable angina pectoris  I25.118 Basic metabolic panel with GFR    CBC with Differential/Platelet    Procedural/ Surgical Case Request: LEFT HEART CATH AND CORONARY ANGIOGRAPHY with possible coronary intervention       Problem List Items Addressed This Visit       Cardiovascular and Mediastinum   NSTEMI (non-ST elevated myocardial infarction) (HCC)   Relevant Orders   Basic metabolic panel with GFR   CBC with Differential/Platelet   Procedural/ Surgical Case Request: LEFT HEART CATH AND CORONARY ANGIOGRAPHY with possible coronary intervention   Inappropriate sinus tachycardia - Primary   Relevant Orders    Basic metabolic panel with GFR   CBC with Differential/Platelet   Procedural/ Surgical Case Request: LEFT HEART CATH AND CORONARY ANGIOGRAPHY with possible coronary intervention   CHF (congestive heart failure), NYHA class III, acute on chronic, diastolic (HCC)   Relevant Orders   Basic metabolic panel with GFR   CBC with Differential/Platelet   Procedural/ Surgical Case Request: LEFT HEART CATH AND CORONARY ANGIOGRAPHY with possible coronary intervention   Coronary artery disease of native artery of native heart with stable angina pectoris   Relevant Orders   Basic metabolic panel with GFR   CBC with Differential/Platelet   Procedural/ Surgical Case Request: LEFT HEART CATH AND CORONARY ANGIOGRAPHY with possible coronary intervention     Other   SOB (shortness of breath)   Relevant Orders   Basic metabolic panel with GFR   CBC with Differential/Platelet   Procedural/ Surgical Case Request: LEFT HEART CATH AND CORONARY ANGIOGRAPHY with possible coronary intervention   Other chest pain   Relevant Orders   Basic metabolic panel with GFR   CBC with Differential/Platelet   Procedural/ Surgical Case Request: LEFT HEART CATH AND CORONARY ANGIOGRAPHY with possible coronary intervention       Disposition:   Return in about 1 week (around 12/22/2023) for set up cardic cath.    Total time spent: 30 minutes  Signed,  Denyse Bathe, MD  12/15/2023 1:58 PM    [facility]

## 2023-12-16 LAB — CBC WITH DIFFERENTIAL/PLATELET
Basophils Absolute: 0.1 x10E3/uL (ref 0.0–0.2)
Basos: 1 %
EOS (ABSOLUTE): 0.5 x10E3/uL — ABNORMAL HIGH (ref 0.0–0.4)
Eos: 7 %
Hematocrit: 47.1 % — ABNORMAL HIGH (ref 34.0–46.6)
Hemoglobin: 15.1 g/dL (ref 11.1–15.9)
Immature Grans (Abs): 0 x10E3/uL (ref 0.0–0.1)
Immature Granulocytes: 0 %
Lymphocytes Absolute: 2 x10E3/uL (ref 0.7–3.1)
Lymphs: 26 %
MCH: 29.9 pg (ref 26.6–33.0)
MCHC: 32.1 g/dL (ref 31.5–35.7)
MCV: 93 fL (ref 79–97)
Monocytes Absolute: 0.8 x10E3/uL (ref 0.1–0.9)
Monocytes: 11 %
NRBC: 6 % — ABNORMAL HIGH (ref 0–0)
Neutrophils Absolute: 4.4 x10E3/uL (ref 1.4–7.0)
Neutrophils: 55 %
Platelets: 334 x10E3/uL (ref 150–450)
RBC: 5.05 x10E6/uL (ref 3.77–5.28)
RDW: 13.2 % (ref 11.7–15.4)
WBC: 7.8 x10E3/uL (ref 3.4–10.8)

## 2023-12-16 LAB — BASIC METABOLIC PANEL WITH GFR
BUN/Creatinine Ratio: 14 (ref 12–28)
BUN: 11 mg/dL (ref 8–27)
CO2: 23 mmol/L (ref 20–29)
Calcium: 9.6 mg/dL (ref 8.7–10.3)
Chloride: 94 mmol/L — ABNORMAL LOW (ref 96–106)
Creatinine, Ser: 0.76 mg/dL (ref 0.57–1.00)
Glucose: 111 mg/dL — ABNORMAL HIGH (ref 70–99)
Potassium: 4 mmol/L (ref 3.5–5.2)
Sodium: 139 mmol/L (ref 134–144)
eGFR: 86 mL/min/1.73 (ref >59)

## 2023-12-22 ENCOUNTER — Ambulatory Visit
Admission: RE | Admit: 2023-12-22 | Discharge: 2023-12-22 | Disposition: A | Discharge status: Home / Self Care | Attending: Cardiovascular Disease | Admitting: Cardiovascular Disease

## 2023-12-22 ENCOUNTER — Encounter: Admission: RE | Disposition: A | Payer: Self-pay | Source: Home / Self Care | Attending: Cardiovascular Disease

## 2023-12-22 DIAGNOSIS — I5033 Acute on chronic diastolic (congestive) heart failure: Secondary | ICD-10-CM | POA: Insufficient documentation

## 2023-12-22 DIAGNOSIS — I25118 Atherosclerotic heart disease of native coronary artery with other forms of angina pectoris: Secondary | ICD-10-CM | POA: Insufficient documentation

## 2023-12-22 DIAGNOSIS — R0789 Other chest pain: Secondary | ICD-10-CM | POA: Insufficient documentation

## 2023-12-22 DIAGNOSIS — I214 Non-ST elevation (NSTEMI) myocardial infarction: Secondary | ICD-10-CM | POA: Insufficient documentation

## 2023-12-22 DIAGNOSIS — I4711 Inappropriate sinus tachycardia, so stated: Secondary | ICD-10-CM | POA: Insufficient documentation

## 2023-12-22 DIAGNOSIS — R0602 Shortness of breath: Secondary | ICD-10-CM | POA: Insufficient documentation

## 2023-12-22 SURGERY — LEFT HEART CATH AND CORONARY ANGIOGRAPHY
Anesthesia: Moderate Sedation | Laterality: Left

## 2023-12-23 ENCOUNTER — Encounter: Payer: Self-pay | Admitting: Cardiovascular Disease

## 2023-12-23 ENCOUNTER — Ambulatory Visit (INDEPENDENT_AMBULATORY_CARE_PROVIDER_SITE_OTHER): Admitting: Cardiovascular Disease

## 2023-12-23 VITALS — BP 114/66 | HR 104 | Ht <= 58 in | Wt 183.2 lb

## 2023-12-23 DIAGNOSIS — I5033 Acute on chronic diastolic (congestive) heart failure: Secondary | ICD-10-CM

## 2023-12-23 DIAGNOSIS — R0789 Other chest pain: Secondary | ICD-10-CM | POA: Diagnosis not present

## 2023-12-23 DIAGNOSIS — Z0130 Encounter for examination of blood pressure without abnormal findings: Secondary | ICD-10-CM

## 2023-12-23 DIAGNOSIS — I214 Non-ST elevation (NSTEMI) myocardial infarction: Secondary | ICD-10-CM

## 2023-12-23 DIAGNOSIS — Z955 Presence of coronary angioplasty implant and graft: Secondary | ICD-10-CM

## 2023-12-23 DIAGNOSIS — I25118 Atherosclerotic heart disease of native coronary artery with other forms of angina pectoris: Secondary | ICD-10-CM

## 2023-12-23 DIAGNOSIS — R0602 Shortness of breath: Secondary | ICD-10-CM | POA: Diagnosis not present

## 2023-12-23 NOTE — Progress Notes (Signed)
 Cardiology Office Note   Date:  12/23/2023   ID:  MADELYNNE Riley, DOB 23-Aug-1955, MRN [id number]  PCP:  Andi Jointer, DO  Cardiologist:  Denyse Bathe, MD      History of Present Illness: Yvette Riley is a 68 y.o. female who presents for  Chief Complaint  Patient presents with   Follow-up    Cath follow up, pt. Did not receive heart cath because [facility] stated they would not take her insurance.    Did not get cardiac cath as insurance only pays for Cath and only PCI. But last cath was a year old. Has chest pain and SOB on walking.      Past Medical History:  Diagnosis Date   Allergy    Anemia    Anxiety    Arthritis    Asthma    Blood transfusion without reported diagnosis    C. difficile diarrhea 04/07/2016   CAP (community acquired pneumonia) 03/21/2015   Combined forms of age-related cataract of both eyes 05/06/2016   COPD (chronic obstructive pulmonary disease) (HCC)    CVA (cerebral vascular accident) (HCC) 06/17/2018   Diabetes mellitus without complication (HCC)    Displacement of lumbar intervertebral disc without myelopathy    Emphysema of lung (HCC)    GERD (gastroesophageal reflux disease)    Glaucoma    History of chicken pox    Hyperlipidemia    Hypertension    NSTEMI (non-ST elevated myocardial infarction) (HCC) 01/07/2023   Pneumonia 03/29/2015   Sepsis (HCC) 03/17/2016   Shortness of breath    Small cell lung cancer (HCC)      Past Surgical History:  Procedure Laterality Date   BALLOON DILATION  11/20/2022   Procedure: BALLOON DILATION;  Surgeon: Aundria, Ladell POUR, MD;  Location: [facility] ENDOSCOPY;  Service: Gastroenterology;;   BIOPSY  11/20/2022   Procedure: BIOPSY;  Surgeon: Toledo, Ladell POUR, MD;  Location: [facility] ENDOSCOPY;  Service: Gastroenterology;;   CESAREAN SECTION     CORONARY STENT INTERVENTION N/A 01/08/2023   Procedure: CORONARY STENT INTERVENTION;  Surgeon: Florencio Cara BIRCH, MD;  Location: [facility]  INVASIVE CV LAB;  Service: Cardiovascular;  Laterality: N/A;   ESOPHAGOGASTRODUODENOSCOPY N/A 05/17/2022   Procedure: ESOPHAGOGASTRODUODENOSCOPY (EGD);  Surgeon: Jinny Carmine, MD;  Location: [facility] ENDOSCOPY;  Service: Endoscopy;  Laterality: N/A;   ESOPHAGOGASTRODUODENOSCOPY N/A 07/10/2022   Procedure: ESOPHAGOGASTRODUODENOSCOPY (EGD);  Surgeon: Toledo, Ladell POUR, MD;  Location: [facility] ENDOSCOPY;  Service: Gastroenterology;  Laterality: N/A;   ESOPHAGOGASTRODUODENOSCOPY (EGD) WITH PROPOFOL  N/A 02/08/2022   Procedure: ESOPHAGOGASTRODUODENOSCOPY (EGD) WITH PROPOFOL ;  Surgeon: Therisa Bi, MD;  Location: [facility] ENDOSCOPY;  Service: Gastroenterology;  Laterality: N/A;   ESOPHAGOGASTRODUODENOSCOPY (EGD) WITH PROPOFOL  N/A 11/20/2022   Procedure: ESOPHAGOGASTRODUODENOSCOPY (EGD) WITH PROPOFOL ;  Surgeon: Toledo, Ladell POUR, MD;  Location: [facility] ENDOSCOPY;  Service: Gastroenterology;  Laterality: N/A;   ESOPHAGOGASTRODUODENOSCOPY (EGD) WITH PROPOFOL  N/A 12/25/2022   Procedure: ESOPHAGOGASTRODUODENOSCOPY (EGD) WITH PROPOFOL ;  Surgeon: Unk Corinn Skiff, MD;  Location: [facility] ENDOSCOPY;  Service: Gastroenterology;  Laterality: N/A;   LEFT HEART CATH AND CORONARY ANGIOGRAPHY N/A 01/08/2023   Procedure: LEFT HEART CATH AND CORONARY ANGIOGRAPHY;  Surgeon: Bathe Denyse LABOR, MD;  Location: [facility] INVASIVE CV LAB;  Service: Cardiovascular;  Laterality: N/A;   TUBAL LIGATION     UPPER GI ENDOSCOPY       No current outpatient medications on file.   No current facility-administered medications for this visit.    Allergies:   Other, Penicillins, Yellow jacket venom [bee  venom], Codeine, Crestor [rosuvastatin], Gemfibrozil , Trazodone and nefazodone, and Lipitor [atorvastatin]    Social History:   reports that she quit smoking about 7 years ago. Her smoking use included cigarettes. She started smoking about 52 years ago. She has a 11.3 pack-year smoking history. She has never used smokeless tobacco. She reports that she does  not drink alcohol and does not use drugs.   Family History:  family history includes COPD in her brother; Cancer in her maternal aunt; Heart disease in her brother and mother; Heart failure in her mother; Stroke in her mother.    ROS:     Review of Systems  Constitutional: Negative.   HENT: Negative.    Eyes: Negative.   Respiratory: Negative.    Gastrointestinal: Negative.   Genitourinary: Negative.   Musculoskeletal: Negative.   Skin: Negative.   Neurological: Negative.   Endo/Heme/Allergies: Negative.   Psychiatric/Behavioral: Negative.    All other systems reviewed and are negative.     All other systems are reviewed and negative.    PHYSICAL EXAM: VS:  BP 114/66   Pulse (!) 104   Ht 4' 9 (1.448 m)   Wt 183 lb 3.2 oz (83.1 kg)   SpO2 95%   BMI 39.64 kg/m  , BMI Body mass index is 39.64 kg/m. Last weight:  Wt Readings from Last 3 Encounters:  12/23/23 183 lb 3.2 oz (83.1 kg)  12/15/23 180 lb 12.8 oz (82 kg)  11/19/23 184 lb (83.5 kg)     Physical Exam Constitutional:      Appearance: Normal appearance.  Cardiovascular:     Rate and Rhythm: Normal rate and regular rhythm.     Heart sounds: Normal heart sounds.  Pulmonary:     Effort: Pulmonary effort is normal.     Breath sounds: Normal breath sounds.  Musculoskeletal:     Right lower leg: No edema.     Left lower leg: No edema.  Neurological:     Mental Status: She is alert.       EKG:   Recent Labs: 01/08/2023: TSH 7.022 01/13/2023: ALT 15 12/15/2023: BUN 11; Creatinine, Ser 0.76; Hemoglobin 15.1; Platelets 334; Potassium 4.0; Sodium 139    Lipid Panel    Component Value Date/Time   CHOL 167 01/08/2023 0328   CHOL 228 (H) 06/19/2020 1405   TRIG 561 (H) 01/08/2023 0328   HDL 31 (L) 01/08/2023 0328   HDL 35 (L) 06/19/2020 1405   CHOLHDL 5.4 01/08/2023 0328   VLDL UNABLE TO CALCULATE IF TRIGLYCERIDE OVER 400 mg/dL 87/88/7975 9671   LDLCALC UNABLE TO CALCULATE IF TRIGLYCERIDE OVER 400  mg/dL 87/88/7975 9671   LDLCALC 82 06/19/2020 1405   LDLDIRECT 81 01/08/2023 0328      Other studies Reviewed: Additional studies/ records that were reviewed today include:  Review of the above records demonstrates:       No data to display            ASSESSMENT AND PLAN:    ICD-10-CM   1. History of placement of stent in LAD coronary artery  Z95.5    Needs PCIof OM, once approved as had high grade lesion.    2. CHF (congestive heart failure), NYHA class III, acute on chronic, diastolic (HCC)  I50.33     3. SOB (shortness of breath)  R06.02     4. NSTEMI (non-ST elevated myocardial infarction) (HCC)  I21.4     5. Other chest pain  R07.89     6.  Coronary artery disease of native artery of native heart with stable angina pectoris  I25.118        Problem List Items Addressed This Visit       Cardiovascular and Mediastinum   NSTEMI (non-ST elevated myocardial infarction) (HCC)   CHF (congestive heart failure), NYHA class III, acute on chronic, diastolic (HCC)   Coronary artery disease of native artery of native heart with stable angina pectoris     Other   SOB (shortness of breath)   Other chest pain   Other Visit Diagnoses       History of placement of stent in LAD coronary artery    -  Primary   Needs PCIof OM, once approved as had high grade lesion.          Disposition:   Return in about 4 weeks (around 01/20/2024) for Try to get approval of angioplasty only, for Dr Darron and then will put orders in.    Total time spent: 45 minutes  Signed,  Denyse Bathe, MD  12/23/2023 9:23 AM    [facility]

## 2024-01-20 ENCOUNTER — Ambulatory Visit: Admitting: Cardiovascular Disease

## 2024-02-06 ENCOUNTER — Encounter: Payer: Self-pay | Admitting: Cardiovascular Disease

## 2024-02-06 ENCOUNTER — Ambulatory Visit: Admitting: Cardiovascular Disease

## 2024-02-06 VITALS — BP 136/74 | HR 106 | Ht <= 58 in | Wt 178.0 lb

## 2024-02-06 DIAGNOSIS — I214 Non-ST elevation (NSTEMI) myocardial infarction: Secondary | ICD-10-CM

## 2024-02-06 DIAGNOSIS — I4711 Inappropriate sinus tachycardia, so stated: Secondary | ICD-10-CM | POA: Diagnosis not present

## 2024-02-06 DIAGNOSIS — I663 Occlusion and stenosis of cerebellar arteries: Secondary | ICD-10-CM | POA: Diagnosis not present

## 2024-02-06 DIAGNOSIS — Z955 Presence of coronary angioplasty implant and graft: Secondary | ICD-10-CM | POA: Diagnosis not present

## 2024-02-06 DIAGNOSIS — I5033 Acute on chronic diastolic (congestive) heart failure: Secondary | ICD-10-CM | POA: Diagnosis not present

## 2024-02-06 DIAGNOSIS — R0602 Shortness of breath: Secondary | ICD-10-CM

## 2024-02-06 DIAGNOSIS — I25118 Atherosclerotic heart disease of native coronary artery with other forms of angina pectoris: Secondary | ICD-10-CM

## 2024-02-06 DIAGNOSIS — R0789 Other chest pain: Secondary | ICD-10-CM

## 2024-02-06 DIAGNOSIS — I2510 Atherosclerotic heart disease of native coronary artery without angina pectoris: Secondary | ICD-10-CM

## 2024-02-06 NOTE — Progress Notes (Signed)
 "     Cardiology Office Note   Date:  02/06/2024   ID:  Yvette Riley, DOB Dec 28, 1955, MRN [id number]  PCP:  Andi Jointer, DO  Cardiologist:  Denyse Bathe, MD      History of Present Illness: Yvette Riley is a 69 y.o. female who presents for  Chief Complaint  Patient presents with   Follow-up    4 weeks follow up    Doing well, no chest pain or SOB.      Past Medical History:  Diagnosis Date   Allergy    Anemia    Anxiety    Arthritis    Asthma    Blood transfusion without reported diagnosis    C. difficile diarrhea 04/07/2016   CAP (community acquired pneumonia) 03/21/2015   Combined forms of age-related cataract of both eyes 05/06/2016   COPD (chronic obstructive pulmonary disease) (HCC)    CVA (cerebral vascular accident) (HCC) 06/17/2018   Diabetes mellitus without complication (HCC)    Displacement of lumbar intervertebral disc without myelopathy    Emphysema of lung (HCC)    GERD (gastroesophageal reflux disease)    Glaucoma    History of chicken pox    Hyperlipidemia    Hypertension    NSTEMI (non-ST elevated myocardial infarction) (HCC) 01/07/2023   Pneumonia 03/29/2015   Sepsis (HCC) 03/17/2016   Shortness of breath    Small cell lung cancer (HCC)      Past Surgical History:  Procedure Laterality Date   BALLOON DILATION  11/20/2022   Procedure: BALLOON DILATION;  Surgeon: Aundria, Ladell POUR, MD;  Location: [facility] ENDOSCOPY;  Service: Gastroenterology;;   BIOPSY  11/20/2022   Procedure: BIOPSY;  Surgeon: Toledo, Ladell POUR, MD;  Location: [facility] ENDOSCOPY;  Service: Gastroenterology;;   CESAREAN SECTION     CORONARY STENT INTERVENTION N/A 01/08/2023   Procedure: CORONARY STENT INTERVENTION;  Surgeon: Florencio Cara BIRCH, MD;  Location: [facility] INVASIVE CV LAB;  Service: Cardiovascular;  Laterality: N/A;   ESOPHAGOGASTRODUODENOSCOPY N/A 05/17/2022   Procedure: ESOPHAGOGASTRODUODENOSCOPY (EGD);  Surgeon: Jinny Carmine, MD;  Location: [facility] ENDOSCOPY;   Service: Endoscopy;  Laterality: N/A;   ESOPHAGOGASTRODUODENOSCOPY N/A 07/10/2022   Procedure: ESOPHAGOGASTRODUODENOSCOPY (EGD);  Surgeon: Toledo, Ladell POUR, MD;  Location: [facility] ENDOSCOPY;  Service: Gastroenterology;  Laterality: N/A;   ESOPHAGOGASTRODUODENOSCOPY (EGD) WITH PROPOFOL  N/A 02/08/2022   Procedure: ESOPHAGOGASTRODUODENOSCOPY (EGD) WITH PROPOFOL ;  Surgeon: Therisa Bi, MD;  Location: [facility] ENDOSCOPY;  Service: Gastroenterology;  Laterality: N/A;   ESOPHAGOGASTRODUODENOSCOPY (EGD) WITH PROPOFOL  N/A 11/20/2022   Procedure: ESOPHAGOGASTRODUODENOSCOPY (EGD) WITH PROPOFOL ;  Surgeon: Toledo, Ladell POUR, MD;  Location: [facility] ENDOSCOPY;  Service: Gastroenterology;  Laterality: N/A;   ESOPHAGOGASTRODUODENOSCOPY (EGD) WITH PROPOFOL  N/A 12/25/2022   Procedure: ESOPHAGOGASTRODUODENOSCOPY (EGD) WITH PROPOFOL ;  Surgeon: Unk Corinn Skiff, MD;  Location: [facility] ENDOSCOPY;  Service: Gastroenterology;  Laterality: N/A;   LEFT HEART CATH AND CORONARY ANGIOGRAPHY N/A 01/08/2023   Procedure: LEFT HEART CATH AND CORONARY ANGIOGRAPHY;  Surgeon: Bathe Denyse LABOR, MD;  Location: [facility] INVASIVE CV LAB;  Service: Cardiovascular;  Laterality: N/A;   TUBAL LIGATION     UPPER GI ENDOSCOPY       Current Outpatient Medications  Medication Sig Dispense Refill   albuterol  (PROVENTIL ) (2.5 MG/3ML) 0.083% nebulizer solution Take 2.5 mg by nebulization every 6 (six) hours as needed (COPD).     albuterol  (VENTOLIN  HFA) 108 (90 Base) MCG/ACT inhaler Inhale 2 puffs into the lungs every 6 (six) hours as needed for wheezing or shortness of breath. 8 g 12  aspirin  EC 81 MG tablet Take 1 tablet (81 mg total) by mouth daily. Swallow whole. 30 tablet 12   busPIRone  (BUSPAR ) 5 MG tablet Take 5 mg by mouth 3 (three) times daily.     Cholecalciferol (VITAMIN D) 50 MCG (2000 UT) CAPS Take 4,000 Units by mouth daily.     clopidogrel  (PLAVIX ) 75 MG tablet Take 1 tablet (75 mg total) by mouth daily. 90 tablet 4   dapagliflozin  propanediol  (FARXIGA ) 10 MG TABS tablet Take 1 tablet (10 mg total) by mouth daily before breakfast. 30 tablet 3   diclofenac  Sodium (VOLTAREN ) 1 % GEL Apply 4 g topically 4 (four) times daily. 350 g 3   econazole nitrate 1 % cream Apply 1 Application topically 2 (two) times daily as needed. (Patient not taking: Reported on 12/23/2023)     ezetimibe  (ZETIA ) 10 MG tablet Take 10 mg by mouth daily.     ferrous sulfate  324 MG TBEC Take 324 mg by mouth daily.     Fluticasone -Umeclidin-Vilant (TRELEGY ELLIPTA ) 200-62.5-25 MCG/ACT AEPB Inhale 1 puff into the lungs daily. 30 each 12   gabapentin  (NEURONTIN ) 300 MG capsule Take 300 mg by mouth 2 (two) times daily.     hydrocortisone cream 1 % Apply 1 Application topically every 6 (six) hours as needed for itching. (Patient not taking: Reported on 12/23/2023)     ketoconazole  (NIZORAL ) 2 % shampoo Apply 1 Application topically 2 (two) times a week. Apply to scalp twice a week during shower on Wednesday and Saturday. Lather and allow to sit for 5 minutes before rinsing.     levothyroxine  (SYNTHROID ) 88 MCG tablet Take 88 mcg by mouth daily.     loperamide (IMODIUM) 2 MG capsule Take by mouth as needed for diarrhea or loose stools. (Patient not taking: Reported on 12/23/2023)     Magnesium 400 MG TABS Take 400 mg by mouth 2 (two) times daily.     metFORMIN  (GLUCOPHAGE ) 1000 MG tablet Take 1,000 mg by mouth 2 (two) times daily.     metoprolol  succinate (TOPROL  XL) 100 MG 24 hr tablet Take 1 tablet (100 mg total) by mouth daily. Take with or immediately following a meal. 30 tablet 11   nystatin (MYCOSTATIN/NYSTOP) powder Apply 1 Application topically 3 (three) times daily as needed (Apply to grion).     omeprazole  (PRILOSEC) 40 MG capsule Take 1 capsule (40 mg total) by mouth 2 (two) times daily. Take in the morning and at dinnertime. 120 capsule 0   ondansetron  (ZOFRAN ) 4 MG tablet Take 4 mg by mouth every 6 (six) hours as needed for nausea. (Patient not taking: Reported on  12/23/2023)     oxyCODONE -acetaminophen  (PERCOCET) 10-325 MG tablet Take 1 tablet by mouth every 6 (six) hours as needed for pain. DO NOT administer within 3 hours ( before or After) Zaleplon  or Within 3 hours for returning from an outting     OXYGEN  Place 2 L into the nose continuous. Cannula     senna (SENOKOT) 8.6 MG TABS tablet Take 2 tablets by mouth daily.     sertraline  (ZOLOFT ) 50 MG tablet Take 1 tablet (50 mg total) by mouth daily. 90 tablet 4   torsemide  (DEMADEX ) 10 MG tablet Take 1 tablet (10 mg total) by mouth daily. 30 tablet 11   vitamin B-12 (CYANOCOBALAMIN ) 1000 MCG tablet Take 1 tablet (1,000 mcg total) by mouth daily. (Patient taking differently: Take 500 mcg by mouth daily.) 30 tablet 0   zaleplon  (SONATA ) 5  MG capsule Take 5 mg by mouth at bedtime.     No current facility-administered medications for this visit.    Allergies:   Other, Penicillins, Yellow jacket venom [bee venom], Codeine, Crestor [rosuvastatin], Gemfibrozil , Trazodone and nefazodone, and Lipitor [atorvastatin]    Social History:   reports that she quit smoking about 7 years ago. Her smoking use included cigarettes. She started smoking about 52 years ago. She has a 11.3 pack-year smoking history. She has never used smokeless tobacco. She reports that she does not drink alcohol and does not use drugs.   Family History:  family history includes COPD in her brother; Cancer in her maternal aunt; Heart disease in her brother and mother; Heart failure in her mother; Stroke in her mother.    ROS:     Review of Systems  Constitutional: Negative.   HENT: Negative.    Eyes: Negative.   Respiratory: Negative.    Gastrointestinal: Negative.   Genitourinary: Negative.   Musculoskeletal: Negative.   Skin: Negative.   Neurological: Negative.   Endo/Heme/Allergies: Negative.   Psychiatric/Behavioral: Negative.    All other systems reviewed and are negative.     All other systems are reviewed and negative.     PHYSICAL EXAM: VS:  BP 136/74   Pulse (!) 106   Ht 4' 9 (1.448 m)   Wt 178 lb (80.7 kg)   SpO2 91%   BMI 38.52 kg/m  , BMI Body mass index is 38.52 kg/m. Last weight:  Wt Readings from Last 3 Encounters:  02/06/24 178 lb (80.7 kg)  12/23/23 183 lb 3.2 oz (83.1 kg)  12/15/23 180 lb 12.8 oz (82 kg)     Physical Exam Constitutional:      Appearance: Normal appearance.  Cardiovascular:     Rate and Rhythm: Normal rate and regular rhythm.     Heart sounds: Normal heart sounds.  Pulmonary:     Effort: Pulmonary effort is normal.     Breath sounds: Normal breath sounds.  Musculoskeletal:     Right lower leg: No edema.     Left lower leg: No edema.  Neurological:     Mental Status: She is alert.       EKG:   Recent Labs: 12/15/2023: BUN 11; Creatinine, Ser 0.76; Hemoglobin 15.1; Platelets 334; Potassium 4.0; Sodium 139    Lipid Panel    Component Value Date/Time   CHOL 167 01/08/2023 0328   CHOL 228 (H) 06/19/2020 1405   TRIG 561 (H) 01/08/2023 0328   HDL 31 (L) 01/08/2023 0328   HDL 35 (L) 06/19/2020 1405   CHOLHDL 5.4 01/08/2023 0328   VLDL UNABLE TO CALCULATE IF TRIGLYCERIDE OVER 400 mg/dL 87/88/7975 9671   LDLCALC UNABLE TO CALCULATE IF TRIGLYCERIDE OVER 400 mg/dL 87/88/7975 9671   LDLCALC 82 06/19/2020 1405   LDLDIRECT 81 01/08/2023 0328      Other studies Reviewed: Additional studies/ records that were reviewed today include:  Review of the above records demonstrates:       No data to display            ASSESSMENT AND PLAN:    ICD-10-CM   1. 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 Atherosclerosis of coronary artery of native heart without angina pectoris, unspecified vessel or lesion type  I25.10     3. Inappropriate sinus tachycardia  I47.11     4. History of placement of stent in LAD coronary artery  Z95.5  No chest pains as has high grade lesion LC not treated, and feeling fine.    5. Coronary artery disease of  native artery of native heart with stable angina pectoris  I25.118     6. Other chest pain  R07.89     7. NSTEMI (non-ST elevated myocardial infarction) (HCC)  I21.4     8. SOB (shortness of breath)  R06.02     9. CHF (congestive heart failure), NYHA class III, acute on chronic, diastolic (HCC)  I50.33        Problem List Items Addressed This Visit       Cardiovascular and Mediastinum   Coronary atherosclerosis   Embolism of superior cerebellar artery - Primary   NSTEMI (non-ST elevated myocardial infarction) (HCC)   Inappropriate sinus tachycardia   CHF (congestive heart failure), NYHA class III, acute on chronic, diastolic (HCC)   Coronary artery disease of native artery of native heart with stable angina pectoris     Other   SOB (shortness of breath)   Other chest pain   Other Visit Diagnoses       History of placement of stent in LAD coronary artery       No chest pains as has high grade lesion LC not treated, and feeling fine.          Disposition:   Return in about 2 months (around 04/05/2024).    Total time spent: 35 minutes  Signed,  Denyse Bathe, MD  02/06/2024 10:12 AM    [facility] "

## 2024-02-16 ENCOUNTER — Ambulatory Visit: Admitting: Cardiovascular Disease

## 2024-02-16 ENCOUNTER — Encounter: Payer: Self-pay | Admitting: Cardiovascular Disease

## 2024-02-16 VITALS — BP 112/74 | HR 85 | Ht <= 58 in | Wt 180.0 lb

## 2024-02-16 DIAGNOSIS — I214 Non-ST elevation (NSTEMI) myocardial infarction: Secondary | ICD-10-CM | POA: Diagnosis not present

## 2024-02-16 DIAGNOSIS — R0602 Shortness of breath: Secondary | ICD-10-CM

## 2024-02-16 DIAGNOSIS — R0789 Other chest pain: Secondary | ICD-10-CM

## 2024-02-16 DIAGNOSIS — I25118 Atherosclerotic heart disease of native coronary artery with other forms of angina pectoris: Secondary | ICD-10-CM | POA: Diagnosis not present

## 2024-02-16 DIAGNOSIS — I5033 Acute on chronic diastolic (congestive) heart failure: Secondary | ICD-10-CM

## 2024-02-16 DIAGNOSIS — Z955 Presence of coronary angioplasty implant and graft: Secondary | ICD-10-CM | POA: Diagnosis not present

## 2024-02-16 DIAGNOSIS — I4711 Inappropriate sinus tachycardia, so stated: Secondary | ICD-10-CM

## 2024-02-16 DIAGNOSIS — Z0130 Encounter for examination of blood pressure without abnormal findings: Secondary | ICD-10-CM

## 2024-02-16 NOTE — Progress Notes (Signed)
 "     Cardiology Office Note   Date:  02/16/2024   ID:  Yvette Riley, DOB [birth year], MRN [id number]  PCP:  Andi Jointer, DO  Cardiologist:  Denyse Bathe, MD      History of Present Illness: Yvette Riley is a 69 y.o. female who presents for  Chief Complaint  Patient presents with   Follow-up    Discuss cardiac cath    Patient was told to come for cardiac cath,but insurance would not approve it.      Past Medical History:  Diagnosis Date   Allergy    Anemia    Anxiety    Arthritis    Asthma    Blood transfusion without reported diagnosis    C. difficile diarrhea 04/07/2016   CAP (community acquired pneumonia) 03/21/2015   Combined forms of age-related cataract of both eyes 05/06/2016   COPD (chronic obstructive pulmonary disease) (HCC)    CVA (cerebral vascular accident) (HCC) 06/17/2018   Diabetes mellitus without complication (HCC)    Displacement of lumbar intervertebral disc without myelopathy    Emphysema of lung (HCC)    GERD (gastroesophageal reflux disease)    Glaucoma    History of chicken pox    Hyperlipidemia    Hypertension    NSTEMI (non-ST elevated myocardial infarction) (HCC) 01/07/2023   Pneumonia 03/29/2015   Sepsis (HCC) 03/17/2016   Shortness of breath    Small cell lung cancer (HCC)      Past Surgical History:  Procedure Laterality Date   BALLOON DILATION  11/20/2022   Procedure: BALLOON DILATION;  Surgeon: Aundria, Ladell POUR, MD;  Location: [facility] ENDOSCOPY;  Service: Gastroenterology;;   BIOPSY  11/20/2022   Procedure: BIOPSY;  Surgeon: Toledo, Ladell POUR, MD;  Location: [facility] ENDOSCOPY;  Service: Gastroenterology;;   CESAREAN SECTION     CORONARY STENT INTERVENTION N/A 01/08/2023   Procedure: CORONARY STENT INTERVENTION;  Surgeon: Florencio Cara BIRCH, MD;  Location: [facility] INVASIVE CV LAB;  Service: Cardiovascular;  Laterality: N/A;   ESOPHAGOGASTRODUODENOSCOPY N/A 05/17/2022   Procedure: ESOPHAGOGASTRODUODENOSCOPY (EGD);  Surgeon:  Jinny Carmine, MD;  Location: [facility] ENDOSCOPY;  Service: Endoscopy;  Laterality: N/A;   ESOPHAGOGASTRODUODENOSCOPY N/A 07/10/2022   Procedure: ESOPHAGOGASTRODUODENOSCOPY (EGD);  Surgeon: Toledo, Ladell POUR, MD;  Location: [facility] ENDOSCOPY;  Service: Gastroenterology;  Laterality: N/A;   ESOPHAGOGASTRODUODENOSCOPY (EGD) WITH PROPOFOL  N/A 02/08/2022   Procedure: ESOPHAGOGASTRODUODENOSCOPY (EGD) WITH PROPOFOL ;  Surgeon: Therisa Bi, MD;  Location: [facility] ENDOSCOPY;  Service: Gastroenterology;  Laterality: N/A;   ESOPHAGOGASTRODUODENOSCOPY (EGD) WITH PROPOFOL  N/A 11/20/2022   Procedure: ESOPHAGOGASTRODUODENOSCOPY (EGD) WITH PROPOFOL ;  Surgeon: Toledo, Ladell POUR, MD;  Location: [facility] ENDOSCOPY;  Service: Gastroenterology;  Laterality: N/A;   ESOPHAGOGASTRODUODENOSCOPY (EGD) WITH PROPOFOL  N/A 12/25/2022   Procedure: ESOPHAGOGASTRODUODENOSCOPY (EGD) WITH PROPOFOL ;  Surgeon: Unk Corinn Skiff, MD;  Location: [facility] ENDOSCOPY;  Service: Gastroenterology;  Laterality: N/A;   LEFT HEART CATH AND CORONARY ANGIOGRAPHY N/A 01/08/2023   Procedure: LEFT HEART CATH AND CORONARY ANGIOGRAPHY;  Surgeon: Bathe Denyse LABOR, MD;  Location: [facility] INVASIVE CV LAB;  Service: Cardiovascular;  Laterality: N/A;   TUBAL LIGATION     UPPER GI ENDOSCOPY       Current Outpatient Medications  Medication Sig Dispense Refill   albuterol  (PROVENTIL ) (2.5 MG/3ML) 0.083% nebulizer solution Take 2.5 mg by nebulization every 6 (six) hours as needed (COPD).     albuterol  (VENTOLIN  HFA) 108 (90 Base) MCG/ACT inhaler Inhale 2 puffs into the lungs every 6 (six) hours as needed for wheezing or shortness  of breath. 8 g 12   aspirin  EC 81 MG tablet Take 1 tablet (81 mg total) by mouth daily. Swallow whole. 30 tablet 12   busPIRone  (BUSPAR ) 5 MG tablet Take 5 mg by mouth 3 (three) times daily.     Cholecalciferol (VITAMIN D) 50 MCG (2000 UT) CAPS Take 4,000 Units by mouth daily.     clopidogrel  (PLAVIX ) 75 MG tablet Take 1 tablet (75 mg total) by mouth  daily. 90 tablet 4   dapagliflozin  propanediol (FARXIGA ) 10 MG TABS tablet Take 1 tablet (10 mg total) by mouth daily before breakfast. 30 tablet 3   diclofenac  Sodium (VOLTAREN ) 1 % GEL Apply 4 g topically 4 (four) times daily. 350 g 3   ezetimibe  (ZETIA ) 10 MG tablet Take 10 mg by mouth daily.     ferrous sulfate  324 MG TBEC Take 324 mg by mouth daily.     Fluticasone -Umeclidin-Vilant (TRELEGY ELLIPTA ) 200-62.5-25 MCG/ACT AEPB Inhale 1 puff into the lungs daily. 30 each 12   gabapentin  (NEURONTIN ) 300 MG capsule Take 300 mg by mouth 2 (two) times daily.     hydrocortisone cream 1 % Apply 1 Application topically every 6 (six) hours as needed for itching.     ketoconazole  (NIZORAL ) 2 % shampoo Apply 1 Application topically 2 (two) times a week. Apply to scalp twice a week during shower on Wednesday and Saturday. Lather and allow to sit for 5 minutes before rinsing.     levothyroxine  (SYNTHROID ) 88 MCG tablet Take 88 mcg by mouth daily.     Magnesium 400 MG TABS Take 400 mg by mouth 2 (two) times daily.     metFORMIN  (GLUCOPHAGE ) 1000 MG tablet Take 1,000 mg by mouth 2 (two) times daily.     metoprolol  succinate (TOPROL  XL) 100 MG 24 hr tablet Take 1 tablet (100 mg total) by mouth daily. Take with or immediately following a meal. 30 tablet 11   nystatin (MYCOSTATIN/NYSTOP) powder Apply 1 Application topically 3 (three) times daily as needed (Apply to grion).     omeprazole  (PRILOSEC) 40 MG capsule Take 1 capsule (40 mg total) by mouth 2 (two) times daily. Take in the morning and at dinnertime. 120 capsule 0   oxyCODONE -acetaminophen  (PERCOCET) 10-325 MG tablet Take 1 tablet by mouth every 6 (six) hours as needed for pain. DO NOT administer within 3 hours ( before or After) Zaleplon  or Within 3 hours for returning from an outting     OXYGEN  Place 2 L into the nose continuous. Cannula     senna (SENOKOT) 8.6 MG TABS tablet Take 2 tablets by mouth daily.     sertraline  (ZOLOFT ) 50 MG tablet Take 1  tablet (50 mg total) by mouth daily. 90 tablet 4   torsemide  (DEMADEX ) 10 MG tablet Take 1 tablet (10 mg total) by mouth daily. 30 tablet 11   vitamin B-12 (CYANOCOBALAMIN ) 1000 MCG tablet Take 1 tablet (1,000 mcg total) by mouth daily. (Patient taking differently: Take 1,000 mcg by mouth daily. Taking 500 mcg) 30 tablet 0   zaleplon  (SONATA ) 5 MG capsule Take 5 mg by mouth at bedtime.     econazole nitrate 1 % cream Apply 1 Application topically 2 (two) times daily as needed. (Patient not taking: Reported on 02/16/2024)     loperamide (IMODIUM) 2 MG capsule Take by mouth as needed for diarrhea or loose stools. (Patient not taking: Reported on 02/16/2024)     ondansetron  (ZOFRAN ) 4 MG tablet Take 4 mg by mouth every  6 (six) hours as needed for nausea. (Patient not taking: Reported on 02/16/2024)     No current facility-administered medications for this visit.    Allergies:   Other, Penicillins, Yellow jacket venom [bee venom], Codeine, Crestor [rosuvastatin], Gemfibrozil , Trazodone and nefazodone, and Lipitor [atorvastatin]    Social History:   reports that she quit smoking about 7 years ago. Her smoking use included cigarettes. She started smoking about 52 years ago. She has a 11.3 pack-year smoking history. She has never used smokeless tobacco. She reports that she does not drink alcohol and does not use drugs.   Family History:  family history includes COPD in her brother; Cancer in her maternal aunt; Heart disease in her brother and mother; Heart failure in her mother; Stroke in her mother.    ROS:     Review of Systems  Constitutional: Negative.   HENT: Negative.    Eyes: Negative.   Respiratory: Negative.    Gastrointestinal: Negative.   Genitourinary: Negative.   Musculoskeletal: Negative.   Skin: Negative.   Neurological: Negative.   Endo/Heme/Allergies: Negative.   Psychiatric/Behavioral: Negative.    All other systems reviewed and are negative.     All other systems are  reviewed and negative.    PHYSICAL EXAM: VS:  BP 112/74   Pulse 85   Ht 4' 9 (1.448 m)   Wt 180 lb (81.6 kg)   SpO2 93%   BMI 38.95 kg/m  , BMI Body mass index is 38.95 kg/m. Last weight:  Wt Readings from Last 3 Encounters:  02/16/24 180 lb (81.6 kg)  02/06/24 178 lb (80.7 kg)  12/23/23 183 lb 3.2 oz (83.1 kg)     Physical Exam Constitutional:      Appearance: Normal appearance.  Cardiovascular:     Rate and Rhythm: Normal rate and regular rhythm.     Heart sounds: Normal heart sounds.  Pulmonary:     Effort: Pulmonary effort is normal.     Breath sounds: Normal breath sounds.  Musculoskeletal:     Right lower leg: No edema.     Left lower leg: No edema.  Neurological:     Mental Status: She is alert.       EKG:   Recent Labs: 12/15/2023: BUN 11; Creatinine, Ser 0.76; Hemoglobin 15.1; Platelets 334; Potassium 4.0; Sodium 139    Lipid Panel    Component Value Date/Time   CHOL 167 01/08/2023 0328   CHOL 228 (H) 06/19/2020 1405   TRIG 561 (H) 01/08/2023 0328   HDL 31 (L) 01/08/2023 0328   HDL 35 (L) 06/19/2020 1405   CHOLHDL 5.4 01/08/2023 0328   VLDL UNABLE TO CALCULATE IF TRIGLYCERIDE OVER 400 mg/dL 87/88/7975 9671   LDLCALC UNABLE TO CALCULATE IF TRIGLYCERIDE OVER 400 mg/dL 87/88/7975 9671   LDLCALC 82 06/19/2020 1405   LDLDIRECT 81 01/08/2023 0328      Other studies Reviewed: Additional studies/ records that were reviewed today include:  Review of the above records demonstrates:       No data to display            ASSESSMENT AND PLAN:    ICD-10-CM   1. Other chest pain  R07.89 MYOCARDIAL PERFUSION IMAGING   No furteher chest pain and insurance would not approve it. Will do stress test.    2. Inappropriate sinus tachycardia  I47.11 MYOCARDIAL PERFUSION IMAGING    3. History of placement of stent in LAD coronary artery  Z95.5 MYOCARDIAL PERFUSION IMAGING  4. Coronary artery disease of native artery of native heart with stable  angina pectoris  I25.118 MYOCARDIAL PERFUSION IMAGING    5. SOB (shortness of breath)  R06.02 MYOCARDIAL PERFUSION IMAGING    6. NSTEMI (non-ST elevated myocardial infarction) (HCC)  I21.4 MYOCARDIAL PERFUSION IMAGING    7. CHF (congestive heart failure), NYHA class III, acute on chronic, diastolic (HCC)  I50.33 MYOCARDIAL PERFUSION IMAGING       Problem List Items Addressed This Visit       Cardiovascular and Mediastinum   NSTEMI (non-ST elevated myocardial infarction) (HCC)   Relevant Orders   MYOCARDIAL PERFUSION IMAGING   Inappropriate sinus tachycardia   Relevant Orders   MYOCARDIAL PERFUSION IMAGING   CHF (congestive heart failure), NYHA class III, acute on chronic, diastolic (HCC)   Relevant Orders   MYOCARDIAL PERFUSION IMAGING   Coronary artery disease of native artery of native heart with stable angina pectoris   Relevant Orders   MYOCARDIAL PERFUSION IMAGING     Other   SOB (shortness of breath)   Relevant Orders   MYOCARDIAL PERFUSION IMAGING   Other chest pain - Primary   Relevant Orders   MYOCARDIAL PERFUSION IMAGING   Other Visit Diagnoses       History of placement of stent in LAD coronary artery       Relevant Orders   MYOCARDIAL PERFUSION IMAGING          Disposition:   Return in about 4 weeks (around 03/15/2024) for stress test and f/u.    Total time spent: 35 minutes  Signed,  Denyse Bathe, MD  02/16/2024 10:45 AM    [facility] "

## 2024-02-19 ENCOUNTER — Encounter

## 2024-02-26 ENCOUNTER — Ambulatory Visit: Admitting: Cardiovascular Disease

## 2024-03-08 ENCOUNTER — Encounter

## 2024-03-12 ENCOUNTER — Ambulatory Visit: Admitting: Cardiovascular Disease

## 2024-04-05 ENCOUNTER — Ambulatory Visit: Admitting: Cardiovascular Disease

## 2024-05-20 ENCOUNTER — Encounter (INDEPENDENT_AMBULATORY_CARE_PROVIDER_SITE_OTHER)

## 2024-05-20 ENCOUNTER — Ambulatory Visit (INDEPENDENT_AMBULATORY_CARE_PROVIDER_SITE_OTHER): Admitting: Nurse Practitioner
